# Patient Record
Sex: Male | Born: 1960 | Race: White | Hispanic: No | Marital: Single | Smoking: Former smoker
Health system: Southern US, Community
[De-identification: ages and names within clinical notes are randomized; demographics above are authoritative.]

## PROBLEM LIST (undated history)

## (undated) DIAGNOSIS — K219 Gastro-esophageal reflux disease without esophagitis: Secondary | ICD-10-CM

## (undated) DIAGNOSIS — Z87442 Personal history of urinary calculi: Secondary | ICD-10-CM

## (undated) DIAGNOSIS — T7840XA Allergy, unspecified, initial encounter: Secondary | ICD-10-CM

## (undated) DIAGNOSIS — G629 Polyneuropathy, unspecified: Secondary | ICD-10-CM

## (undated) DIAGNOSIS — M1990 Unspecified osteoarthritis, unspecified site: Secondary | ICD-10-CM

## (undated) DIAGNOSIS — I10 Essential (primary) hypertension: Secondary | ICD-10-CM

## (undated) DIAGNOSIS — I5043 Acute on chronic combined systolic (congestive) and diastolic (congestive) heart failure: Secondary | ICD-10-CM

## (undated) DIAGNOSIS — I83899 Varicose veins of unspecified lower extremities with other complications: Secondary | ICD-10-CM

## (undated) DIAGNOSIS — G709 Myoneural disorder, unspecified: Secondary | ICD-10-CM

## (undated) DIAGNOSIS — E119 Type 2 diabetes mellitus without complications: Secondary | ICD-10-CM

## (undated) DIAGNOSIS — N189 Chronic kidney disease, unspecified: Secondary | ICD-10-CM

## (undated) DIAGNOSIS — I272 Pulmonary hypertension, unspecified: Secondary | ICD-10-CM

## (undated) DIAGNOSIS — E118 Type 2 diabetes mellitus with unspecified complications: Secondary | ICD-10-CM

## (undated) DIAGNOSIS — S069X9A Unspecified intracranial injury with loss of consciousness of unspecified duration, initial encounter: Secondary | ICD-10-CM

## (undated) DIAGNOSIS — I639 Cerebral infarction, unspecified: Secondary | ICD-10-CM

## (undated) DIAGNOSIS — I8390 Asymptomatic varicose veins of unspecified lower extremity: Secondary | ICD-10-CM

## (undated) HISTORY — DX: Cerebral infarction, unspecified: I63.9

## (undated) HISTORY — DX: Myoneural disorder, unspecified: G70.9

## (undated) HISTORY — DX: Asymptomatic varicose veins of unspecified lower extremity: I83.90

## (undated) HISTORY — DX: Allergy, unspecified, initial encounter: T78.40XA

---

## 2000-08-17 ENCOUNTER — Ambulatory Visit (HOSPITAL_COMMUNITY): Admission: RE | Admit: 2000-08-17 | Discharge: 2000-08-17 | Payer: Self-pay | Admitting: Family Medicine

## 2000-08-17 ENCOUNTER — Encounter: Payer: Self-pay | Admitting: Family Medicine

## 2004-02-20 ENCOUNTER — Ambulatory Visit: Payer: Self-pay | Admitting: Family Medicine

## 2005-05-06 ENCOUNTER — Ambulatory Visit: Payer: Self-pay | Admitting: Family Medicine

## 2012-04-24 ENCOUNTER — Ambulatory Visit (HOSPITAL_COMMUNITY)
Admission: RE | Admit: 2012-04-24 | Discharge: 2012-04-24 | Disposition: A | Discharge status: Home / Self Care | Payer: No Typology Code available for payment source | Source: Ambulatory Visit | Attending: Family Medicine | Admitting: Family Medicine

## 2012-04-24 ENCOUNTER — Other Ambulatory Visit (HOSPITAL_COMMUNITY): Payer: Self-pay | Admitting: Family Medicine

## 2012-04-24 DIAGNOSIS — X58XXXA Exposure to other specified factors, initial encounter: Secondary | ICD-10-CM | POA: Insufficient documentation

## 2012-04-24 DIAGNOSIS — S79919A Unspecified injury of unspecified hip, initial encounter: Secondary | ICD-10-CM | POA: Insufficient documentation

## 2012-04-24 DIAGNOSIS — R229 Localized swelling, mass and lump, unspecified: Secondary | ICD-10-CM | POA: Insufficient documentation

## 2013-05-23 ENCOUNTER — Encounter (HOSPITAL_BASED_OUTPATIENT_CLINIC_OR_DEPARTMENT_OTHER): Payer: Self-pay | Attending: Internal Medicine

## 2013-08-08 ENCOUNTER — Emergency Department (HOSPITAL_COMMUNITY)
Admission: EM | Admit: 2013-08-08 | Discharge: 2013-08-08 | Disposition: A | Discharge status: Home / Self Care | Payer: Self-pay | Attending: Emergency Medicine | Admitting: Emergency Medicine

## 2013-08-08 ENCOUNTER — Encounter (HOSPITAL_COMMUNITY): Payer: Self-pay | Admitting: Emergency Medicine

## 2013-08-08 DIAGNOSIS — E119 Type 2 diabetes mellitus without complications: Secondary | ICD-10-CM | POA: Insufficient documentation

## 2013-08-08 DIAGNOSIS — Z794 Long term (current) use of insulin: Secondary | ICD-10-CM | POA: Insufficient documentation

## 2013-08-08 DIAGNOSIS — B029 Zoster without complications: Secondary | ICD-10-CM | POA: Insufficient documentation

## 2013-08-08 DIAGNOSIS — F17200 Nicotine dependence, unspecified, uncomplicated: Secondary | ICD-10-CM | POA: Insufficient documentation

## 2013-08-08 DIAGNOSIS — Z79899 Other long term (current) drug therapy: Secondary | ICD-10-CM | POA: Insufficient documentation

## 2013-08-08 HISTORY — DX: Type 2 diabetes mellitus without complications: E11.9

## 2013-08-08 MED ORDER — TETRACAINE HCL 0.5 % OP SOLN
1.0000 [drp] | Freq: Once | OPHTHALMIC | Status: AC
  Administered 2013-08-08: 1 [drp] via OPHTHALMIC
  Filled 2013-08-08: qty 2

## 2013-08-08 MED ORDER — ACYCLOVIR 400 MG PO TABS: 400.0000 mg | ORAL_TABLET | Freq: Every day | ORAL | Status: AC

## 2013-08-08 MED ORDER — FLUORESCEIN SODIUM 1 MG OP STRP
1.0000 | ORAL_STRIP | Freq: Once | OPHTHALMIC | Status: AC
  Administered 2013-08-08: 1 via OPHTHALMIC
  Filled 2013-08-08: qty 1

## 2013-08-08 NOTE — Discharge Instructions (Signed)
Shingles °Shingles (herpes zoster) is an infection that is caused by the same virus that causes chickenpox (varicella). The infection causes a painful skin rash and fluid-filled blisters, which eventually break open, crust over, and heal. It may occur in any area of the body, but it usually affects only one side of the body or face. The pain of shingles usually lasts about 1 month. However, some people with shingles may develop long-term (chronic) pain in the affected area of the body. °Shingles often occurs many years after the person had chickenpox. It is more common: °· In people older than 50 years. °· In people with weakened immune systems, such as those with HIV, AIDS, or cancer. °· In people taking medicines that weaken the immune system, such as transplant medicines. °· In people under great stress. °CAUSES  °Shingles is caused by the varicella zoster virus (VZV), which also causes chickenpox. After a person is infected with the virus, it can remain in the person's body for years in an inactive state (dormant). To cause shingles, the virus reactivates and breaks out as an infection in a nerve root. °The virus can be spread from person to person (contagious) through contact with open blisters of the shingles rash. It will only spread to people who have not had chickenpox. When these people are exposed to the virus, they may develop chickenpox. They will not develop shingles. Once the blisters scab over, the person is no longer contagious and cannot spread the virus to others. °SYMPTOMS  °Shingles shows up in stages. The initial symptoms may be pain, itching, and tingling in an area of the skin. This pain is usually described as burning, stabbing, or throbbing. In a few days or weeks, a painful red rash will appear in the area where the pain, itching, and tingling were felt. The rash is usually on one side of the body in a band or belt-like pattern. Then, the rash usually turns into fluid-filled blisters. They  will scab over and dry up in approximately 2-3 weeks. °Flu-like symptoms may also occur with the initial symptoms, the rash, or the blisters. These may include: °· Fever. °· Chills. °· Headache. °· Upset stomach. °DIAGNOSIS  °Your caregiver will perform a skin exam to diagnose shingles. Skin scrapings or fluid samples may also be taken from the blisters. This sample will be examined under a microscope or sent to a lab for further testing. °TREATMENT  °There is no specific cure for shingles. Your caregiver will likely prescribe medicines to help you manage the pain, recover faster, and avoid long-term problems. This may include antiviral drugs, anti-inflammatory drugs, and pain medicines. °HOME CARE INSTRUCTIONS  °· Take a cool bath or apply cool compresses to the area of the rash or blisters as directed. This may help with the pain and itching.   °· Only take over-the-counter or prescription medicines as directed by your caregiver.   °· Rest as directed by your caregiver. °· Keep your rash and blisters clean with mild soap and cool water or as directed by your caregiver.  °· Do not pick your blisters or scratch your rash. Apply an anti-itch cream or numbing creams to the affected area as directed by your caregiver. °· Keep your shingles rash covered with a loose bandage (dressing). °· Avoid skin contact with: °¨ Babies.   °¨ Pregnant women.   °¨ Children with eczema.   °¨ Elderly people with transplants.   °¨ People with chronic illnesses, such as leukemia or AIDS.   °· Wear loose-fitting clothing to help ease the   pain of material rubbing against the rash. °· Keep all follow-up appointments with your caregiver. If the area involved is on your face, you may receive a referral for follow-up to a specialist, such as an eye doctor (ophthalmologist) or an ear, nose, and throat (ENT) doctor. Keeping all follow-up appointments will help you avoid eye complications, chronic pain, or disability.   °SEEK IMMEDIATE MEDICAL  CARE IF:  °· You have facial pain, pain around the eye area, or loss of feeling on one side of your face. °· You have ear pain or ringing in your ear. °· You have loss of taste. °· Your pain is not relieved with prescribed medicines.   °· Your redness or swelling spreads.   °· You have more pain and swelling.  °· Your condition is worsening or has changed.   °· You have a fever or persistent symptoms for more than 2-3 days. °· You have a fever and your symptoms suddenly get worse. °MAKE SURE YOU: °· Understand these instructions. °· Will watch your condition. °· Will get help right away if you are not doing well or get worse. °Document Released: 01/10/2005 Document Revised: 10/05/2011 Document Reviewed: 08/25/2011 °[employer]® Patient Information ©2015 [employer]. This information is not intended to replace advice given to you by your health care provider. Make sure you discuss any questions you have with your health care provider. ° °

## 2013-08-08 NOTE — ED Provider Notes (Signed)
Medical screening examination/treatment/procedure(s) were performed by non-physician practitioner and as supervising physician I was immediately available for consultation/collaboration.   Leota Jacobsen, MD 08/08/13 [phone]

## 2013-08-08 NOTE — ED Notes (Signed)
Pt presents with a linear rash Frontal anterior left side of the head, reports intermittent burning sensation on the rash,onset 2 days ago, vital signs indicate low grade fever and elevated b/p of which pt denies hx of hypertension but endorses hx of diabetes.

## 2013-08-08 NOTE — ED Provider Notes (Signed)
CSN: [id number]     Arrival date & time 08/08/13  1535 History  This chart was scribed for Edgar Skeans, PA-C, non-physician practitioner working with Leota Jacobsen, MD by Vernell Barrier, ED scribe. This patient was seen in room WTR6/WTR6 and the patient's care was started at 4:36 PM.   Chief Complaint  Patient presents with  . Rash    Anterior frontal left side of the head   Patient is a 53 y.o. male presenting with rash. The history is provided by the patient. No language interpreter was used.  Rash Location:  Face and head/neck Head/neck rash location:  Head and L ear Facial rash location:  Forehead and L eye Quality: burning and redness   Quality: not itchy   Severity:  Mild Onset quality:  Gradual Duration:  2 days Timing:  Constant Progression:  Spreading Chronicity:  New Context: not plant contact   Relieved by:  Nothing Worsened by:  Nothing tried Ineffective treatments:  None tried Associated symptoms: no fever, no nausea and not vomiting    HPI Comments: Edgar Brewer is a 53 y.o. male w/ hx of DM2 presents to the Emergency Department complaining of a painful, non-itchy rash to the left forehead going up to the left parietal; onset 2 days ago. Intermittent shooting, burning, painful sensation associated. States it is also moving into the skin of his left eye primarily inner cornner and behind his left ear. Never had a rash present like this before. No rash at the tip of the nose. No known rash at the back of the head. Has done some work outside but denies any exposure to poison ivy or poison oak. Allergic to codeine. Does not regularly check blood sugar since at home machine broke; states it is usually in the mid 100s. Denies visual disturbance, fever, chills, nausea, or vomiting.  Also reports white blisters around the ankles due to diabetes. States the blisters have since burst. No pain.    PCP: Dr. Marlou Sa (Appt to see July 23)  Past Medical History  Diagnosis  Date  . Diabetes mellitus without complication    History reviewed. No pertinent past surgical history. History reviewed. No pertinent family history. History  Substance Use Topics  . Smoking status: Current Every Day Smoker -- 1.00 packs/day    Types: Cigarettes  . Smokeless tobacco: Never Used  . Alcohol Use: 0.6 oz/week    1 Glasses of wine per week    Review of Systems  Constitutional: Negative for fever and chills.  Eyes: Positive for pain. Negative for visual disturbance.  Gastrointestinal: Negative for nausea and vomiting.  Skin: Positive for rash.  All other systems reviewed and are negative.  Allergies  Codeine  Home Medications   Prior to Admission medications   Medication Sig Start Date End Date Taking? Authorizing Provider  aspirin 325 MG tablet Take 650 mg by mouth every 4 (four) hours as needed for mild pain.   Yes Historical Provider, MD  glipiZIDE (GLUCOTROL) 10 MG tablet Take 10 mg by mouth 2 (two) times daily before a meal.   Yes Historical Provider, MD  insulin detemir (LEVEMIR) 100 UNIT/ML injection Inject 30 Units into the skin at bedtime.   Yes Historical Provider, MD  Omega-3 Fatty Acids (FISH OIL PO) Take 1 capsule by mouth at bedtime.   Yes Historical Provider, MD  PRESCRIPTION MEDICATION Take 1 tablet by mouth daily. Dr gave patient samples of blood pressure/kidney medication - patient unsure of name   Yes Historical  Provider, MD  vitamin C (ASCORBIC ACID) 500 MG tablet Take 500 mg by mouth at bedtime.   Yes Historical Provider, MD  acyclovir (ZOVIRAX) 400 MG tablet Take 1 tablet (400 mg total) by mouth 5 (five) times daily. 08/08/13 08/18/13  Gilford Lardizabal A Forcucci, PA-C   Triage vitals: BP 177/74  Pulse 80  Temp(Src) 99.1 F (37.3 C) (Oral)  Resp 20  Ht 5\' 8"  (1.727 m)  Wt 205 lb (92.987 kg)  BMI 31.18 kg/m2  SpO2 97%  Physical Exam  Nursing note and vitals reviewed. Constitutional: He is oriented to person, place, and time. He appears  well-developed and well-nourished. No distress.  HENT:  Head: Normocephalic and atraumatic.  Left Ear: Tympanic membrane and external ear normal.  Eyes: Conjunctivae and EOM are normal. Pupils are equal, round, and reactive to light.  Left eye fluorescein dye examination with slit lamp reveals no increased uptake of fluorescein dye or any keratitic lesions at this time.  Neck: Neck supple. No tracheal deviation present.  Cardiovascular: Normal rate, regular rhythm and normal heart sounds.  Exam reveals no gallop and no friction rub.   No murmur heard. Pulmonary/Chest: Effort normal. No respiratory distress. He has no wheezes. He has no rales.  Musculoskeletal: Normal range of motion.  Neurological: He is alert and oriented to person, place, and time. He has normal strength. No cranial nerve deficit or sensory deficit.  Skin: Skin is warm and dry. Rash noted. No petechiae and no purpura noted. Rash is vesicular. Rash is not macular, not papular, not maculopapular, not nodular, not pustular and not urticarial. He is not diaphoretic. There is erythema. No pallor.     Vesicular rash with some excoriation on an erythematous base found over the left eye and into the posterior hairline.  There is no drainage, purpura, or hemorrhage at this time.    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  Ulcers are clean and dry with no surrounding erythema or warmth and do not appear to be infected at this time.    Psychiatric: He has a normal mood and affect. His behavior is normal.    ED Course  Procedures (including critical care time) DIAGNOSTIC STUDIES: Oxygen Saturation is 97% on room air, normal by my interpretation.    COORDINATION OF CARE: At 4:55 PM: Discussed treatment plan with patient which includes further evaluation using fluorescein stain. Suspected shingles; will treat with Acyclovir. Patient agrees.   WOODS LAMP: Performed by Edgar Skeans, PA-C @ 5:57 PM  Labs Review Labs Reviewed - No data to display  Imaging Review No results found.   EKG Interpretation None      MDM   Final diagnoses:  Herpes zoster  Patient is a 53 y.o. Male who presents to the ED with rash to the face.  Rash appears to be consistent with herpes zoster at this time.  There is negative hutchinsons sign and there is no keratitic lesion on fluorescein dye at this time.  Will prescribe acyclovir x 7 days.  Patient does have bilateral diabetic ulcers, but does not appear to have any cellulitis or infection at this time.  Patient was instructed to keep the wounds clean and dry.  Patient was told to return for eye pain, changes in his vision, fever, nausea, vomiting, or cellulitis signs.  He states understanding at this time.  Patient has an appointment with Dr. Marlou Sa on 08/15/13.   I personally performed the services described in this documentation,  which was scribed in my presence. The recorded information has been reviewed and is accurate.     Cherylann Parr, PA-C 08/08/13 1825

## 2014-05-04 ENCOUNTER — Emergency Department (HOSPITAL_COMMUNITY): Payer: Self-pay

## 2014-05-04 ENCOUNTER — Emergency Department (HOSPITAL_COMMUNITY)
Admission: EM | Admit: 2014-05-04 | Discharge: 2014-05-04 | Disposition: A | Discharge status: Home / Self Care | Payer: Self-pay | Attending: Emergency Medicine | Admitting: Emergency Medicine

## 2014-05-04 ENCOUNTER — Encounter (HOSPITAL_COMMUNITY): Payer: Self-pay | Admitting: Emergency Medicine

## 2014-05-04 DIAGNOSIS — R600 Localized edema: Secondary | ICD-10-CM | POA: Insufficient documentation

## 2014-05-04 DIAGNOSIS — E119 Type 2 diabetes mellitus without complications: Secondary | ICD-10-CM | POA: Insufficient documentation

## 2014-05-04 DIAGNOSIS — R609 Edema, unspecified: Secondary | ICD-10-CM

## 2014-05-04 DIAGNOSIS — Z23 Encounter for immunization: Secondary | ICD-10-CM | POA: Insufficient documentation

## 2014-05-04 DIAGNOSIS — Z79899 Other long term (current) drug therapy: Secondary | ICD-10-CM | POA: Insufficient documentation

## 2014-05-04 DIAGNOSIS — F17200 Nicotine dependence, unspecified, uncomplicated: Secondary | ICD-10-CM

## 2014-05-04 DIAGNOSIS — R0602 Shortness of breath: Secondary | ICD-10-CM | POA: Insufficient documentation

## 2014-05-04 DIAGNOSIS — Z720 Tobacco use: Secondary | ICD-10-CM | POA: Insufficient documentation

## 2014-05-04 DIAGNOSIS — Z791 Long term (current) use of non-steroidal anti-inflammatories (NSAID): Secondary | ICD-10-CM | POA: Insufficient documentation

## 2014-05-04 DIAGNOSIS — Z794 Long term (current) use of insulin: Secondary | ICD-10-CM | POA: Insufficient documentation

## 2014-05-04 LAB — COMPREHENSIVE METABOLIC PANEL
ALT: 41 U/L (ref 0–53)
ANION GAP: 6 (ref 5–15)
AST: 48 U/L — ABNORMAL HIGH (ref 0–37)
Albumin: 3 g/dL — ABNORMAL LOW (ref 3.5–5.2)
Alkaline Phosphatase: 197 U/L — ABNORMAL HIGH (ref 39–117)
BUN: 25 mg/dL — AB (ref 6–23)
CALCIUM: 8.6 mg/dL (ref 8.4–10.5)
CO2: 28 mmol/L (ref 19–32)
Chloride: 108 mmol/L (ref 96–112)
Creatinine, Ser: 1.44 mg/dL — ABNORMAL HIGH (ref 0.50–1.35)
GFR calc Af Amer: 62 mL/min — ABNORMAL LOW (ref >90)
GFR calc non Af Amer: 54 mL/min — ABNORMAL LOW (ref >90)
Glucose, Bld: 167 mg/dL — ABNORMAL HIGH (ref 70–99)
Potassium: 4.7 mmol/L (ref 3.5–5.1)
Sodium: 142 mmol/L (ref 135–145)
Total Bilirubin: 0.4 mg/dL (ref 0.3–1.2)
Total Protein: 5.9 g/dL — ABNORMAL LOW (ref 6.0–8.3)

## 2014-05-04 LAB — CBC WITH DIFFERENTIAL/PLATELET
Basophils Absolute: 0 10*3/uL (ref 0.0–0.1)
Basophils Relative: 0 % (ref 0–1)
Eosinophils Absolute: 0.2 10*3/uL (ref 0.0–0.7)
Eosinophils Relative: 3 % (ref 0–5)
HCT: 37.5 % — ABNORMAL LOW (ref 39.0–52.0)
HEMOGLOBIN: 12.3 g/dL — AB (ref 13.0–17.0)
LYMPHS ABS: 1 10*3/uL (ref 0.7–4.0)
LYMPHS PCT: 11 % — AB (ref 12–46)
MCH: 29.3 pg (ref 26.0–34.0)
MCHC: 32.8 g/dL (ref 30.0–36.0)
MCV: 89.3 fL (ref 78.0–100.0)
Monocytes Absolute: 0.5 10*3/uL (ref 0.1–1.0)
Monocytes Relative: 6 % (ref 3–12)
NEUTROS PCT: 80 % — AB (ref 43–77)
Neutro Abs: 7.1 10*3/uL (ref 1.7–7.7)
Platelets: 216 10*3/uL (ref 150–400)
RBC: 4.2 MIL/uL — AB (ref 4.22–5.81)
RDW: 14 % (ref 11.5–15.5)
WBC: 8.9 10*3/uL (ref 4.0–10.5)

## 2014-05-04 LAB — LIPASE, BLOOD: Lipase: 31 U/L (ref 11–59)

## 2014-05-04 LAB — BRAIN NATRIURETIC PEPTIDE: B Natriuretic Peptide: 299.2 pg/mL — ABNORMAL HIGH (ref 0.0–100.0)

## 2014-05-04 LAB — CBG MONITORING, ED: Glucose-Capillary: 162 mg/dL — ABNORMAL HIGH (ref 70–99)

## 2014-05-04 LAB — TROPONIN I: Troponin I: <0.03 ng/mL (ref <0.031)

## 2014-05-04 MED ORDER — SODIUM CHLORIDE 0.9 % IV SOLN
INTRAVENOUS | Status: DC
  Administered 2014-05-04: 17:00:00 via INTRAVENOUS

## 2014-05-04 MED ORDER — TETANUS-DIPHTH-ACELL PERTUSSIS 5-2.5-18.5 LF-MCG/0.5 IM SUSP
0.5000 mL | Freq: Once | INTRAMUSCULAR | Status: AC
  Administered 2014-05-04: 0.5 mL via INTRAMUSCULAR
  Filled 2014-05-04: qty 0.5

## 2014-05-04 MED ORDER — FUROSEMIDE 10 MG/ML IJ SOLN
40.0000 mg | Freq: Once | INTRAMUSCULAR | Status: AC
  Administered 2014-05-04: 40 mg via INTRAVENOUS
  Filled 2014-05-04: qty 4

## 2014-05-04 NOTE — ED Provider Notes (Signed)
CSN: [id number]     Arrival date & time 05/04/14  1520 History   First MD Initiated Contact with Patient 05/04/14 1606     Chief Complaint  Patient presents with  . Shortness of Breath  . Leg Swelling  . Abdominal Pain     (Consider location/radiation/quality/duration/timing/severity/associated sxs/prior Treatment) HPI   Bardia Sarantos is a 54 y.o. male complaining of shortness of breath and dry cough associated with increasing abdominal girth, and non-improving bilateral lower extremity peripheral edema worse on the right than the left. Patient recently saw his primary care physician Dr. Marlou Sa who started him on spironolactone approximately 2 weeks ago with no relief. Patient smokes cigarettes daily, has no formal diagnosis of COPD.  Past Medical History  Diagnosis Date  . Diabetes mellitus without complication    History reviewed. No pertinent past surgical history. No family history on file. History  Substance Use Topics  . Smoking status: Current Every Day Smoker -- 1.00 packs/day    Types: Cigarettes  . Smokeless tobacco: Never Used  . Alcohol Use: 0.6 oz/week    1 Glasses of wine per week    Review of Systems  10 systems reviewed and found to be negative, except as noted in the HPI.   Allergies  Codeine  Home Medications   Prior to Admission medications   Medication Sig Start Date End Date Taking? Authorizing Provider  cephALEXin (KEFLEX) 500 MG capsule Take 500 mg by mouth every 12 (twelve) hours. For 10 Days.   Yes Historical Provider, MD  gabapentin (NEURONTIN) 300 MG capsule Take 300 mg by mouth 3 (three) times daily.   Yes Historical Provider, MD  glipiZIDE (GLUCOTROL) 10 MG tablet Take 10 mg by mouth 2 (two) times daily before a meal.   Yes Historical Provider, MD  ibuprofen (ADVIL,MOTRIN) 200 MG tablet Take 400 mg by mouth every 6 (six) hours as needed for moderate pain (pain).   Yes Historical Provider, MD  insulin detemir (LEVEMIR) 100 UNIT/ML injection  Inject 30 Units into the skin at bedtime.   Yes Historical Provider, MD  losartan (COZAAR) 50 MG tablet Take 50 mg by mouth daily.   Yes Historical Provider, MD  naproxen (NAPROSYN) 500 MG tablet Take 500 mg by mouth 2 (two) times daily with a meal.   Yes Historical Provider, MD  PENTOXIFYLLINE ER PO Take 400 mg by mouth 2 (two) times daily.   Yes Historical Provider, MD  spironolactone (ALDACTONE) 25 MG tablet Take 25 mg by mouth daily.   Yes Historical Provider, MD  vitamin C (ASCORBIC ACID) 500 MG tablet Take 500 mg by mouth at bedtime.   Yes Historical Provider, MD   BP 162/88 mmHg  Pulse 84  Temp(Src) 97.9 F (36.6 C) (Oral)  Resp 20  SpO2 99% Physical Exam  Constitutional: He is oriented to person, place, and time. He appears well-developed and well-nourished. No distress.  HENT:  Head: Normocephalic.  Eyes: Conjunctivae and EOM are normal. Pupils are equal, round, and reactive to light.  Neck: Normal range of motion. JVD present.  Cardiovascular: Normal rate and regular rhythm.   Pulmonary/Chest: Effort normal and breath sounds normal. No stridor. No respiratory distress. He has no wheezes. He has no rales. He exhibits no tenderness.  Decreased air movement at the bases with no crackles.  Abdominal: Soft.  Musculoskeletal: Normal range of motion. He exhibits edema and tenderness.  Bilateral lower extremity pitting edema, hyperpigmentation consistent with venous stasis, trace warmth, mild oozing. Distal pulses are  palpable. Neurovascularly intact.  Neurological: He is alert and oriented to person, place, and time.  Psychiatric: He has a normal mood and affect.  Nursing note and vitals reviewed.   ED Course  Procedures (including critical care time) Labs Review Labs Reviewed  CBC WITH DIFFERENTIAL/PLATELET - Abnormal; Notable for the following:    RBC 4.20 (*)    Hemoglobin 12.3 (*)    HCT 37.5 (*)    Neutrophils Relative % 80 (*)    Lymphocytes Relative 11 (*)    All  other components within normal limits  COMPREHENSIVE METABOLIC PANEL - Abnormal; Notable for the following:    Glucose, Bld 167 (*)    BUN 25 (*)    Creatinine, Ser 1.44 (*)    Total Protein 5.9 (*)    Albumin 3.0 (*)    AST 48 (*)    Alkaline Phosphatase 197 (*)    GFR calc non Af Amer 54 (*)    GFR calc Af Amer 62 (*)    All other components within normal limits  BRAIN NATRIURETIC PEPTIDE - Abnormal; Notable for the following:    B Natriuretic Peptide 299.2 (*)    All other components within normal limits  CBG MONITORING, ED - Abnormal; Notable for the following:    Glucose-Capillary 162 (*)    All other components within normal limits  LIPASE, BLOOD  TROPONIN I  I-STAT TROPOININ, ED    Imaging Review Dg Chest 2 View  05/04/2014   CLINICAL DATA:  Right ankle swelling, right lower leg swelling, shortness of Breath  EXAM: CHEST  2 VIEW  COMPARISON:  None.  FINDINGS: Cardiomediastinal silhouette is unremarkable. No acute infiltrate or pleural effusion. Mild degenerative changes thoracic spine. Mild interstitial prominence bilaterally without convincing pulmonary edema.  IMPRESSION: No infiltrate or pleural effusion. Mild interstitial prominence bilaterally without convincing pulmonary edema.   Electronically Signed   By: Lahoma Crocker M.D.   On: 05/04/2014 17:13     EKG Interpretation   Date/Time:  Sunday May 04 2014 16:26:34 EDT Ventricular Rate:  92 PR Interval:  174 QRS Duration: 93 QT Interval:  395 QTC Calculation: 489 R Axis:   74 Text Interpretation:  Sinus rhythm RSR' in V1 or V2, right VCD or RVH  Borderline T abnormalities, lateral leads Borderline prolonged QT interval  no STEMI. Confirmed by Johnney Killian, MD, Jeannie Done [phone]) on 05/04/2014 5:04:29 PM      MDM   Final diagnoses:  SOB (shortness of breath)  Tobacco use disorder  Peripheral edema    Filed Vitals:   05/04/14 1600 05/04/14 1630 05/04/14 1700 05/04/14 1903  BP: 177/89 172/85 167/76 162/88  Pulse: 93  91 91 84  Temp:      TempSrc:      Resp:  21  20  SpO2: 98% 96% 94% 99%    Medications  0.9 %  sodium chloride infusion ( Intravenous New Bag/Given 05/04/14 1723)  furosemide (LASIX) injection 40 mg (40 mg Intravenous Given 05/04/14 1726)  Tdap (BOOSTRIX) injection 0.5 mL (0.5 mLs Intramuscular Given 05/04/14 1846)    Meba Pezzi is a pleasant 54 y.o. male presenting with shortness of breath. No wheezing. Patient does appear to be slightly fluid overloaded with peripheral edema which is not increasing significantly. Lung sounds are clear. EKG is nonischemic, troponin is negative, chest x-rays without significant allergy. Pro BNP is slightly elevated at 299. Case discussed with cardiologist Dr. Wynonia Lawman: I'm trying to expedite outpatient care for him. He has recommended that  I message [facility] to expedite this person's appointment.  Evaluation does not show pathology that would require ongoing emergent intervention or inpatient treatment. Pt is hemodynamically stable and mentating appropriately. Discussed findings and plan with patient/guardian, who agrees with care plan. All questions answered. Return precautions discussed and outpatient follow up given.     Monico Blitz, PA-C 05/04/14 1935  Charlesetta Shanks, MD 05/04/14 [phone]

## 2014-05-04 NOTE — ED Notes (Addendum)
Pt c/o abd pain, trouble breathing, rigth ankle swelling and left lower leg swelling. Pt was given pill to help remove excess water "in my lungs". Marland Kitchen

## 2014-05-04 NOTE — Discharge Instructions (Signed)
It is very important that you make an appointment with the cardiologist as soon as possible to evaluate your heart.  Do not hesitate to return to the emergency room for any new, worsening or concerning symptoms.  Try to quit smoking, reduce salt.

## 2014-05-04 NOTE — ED Notes (Signed)
Ambulated in the hallway on room air. Beginning sats-97%. After walking entire block of the ED sats-96%

## 2014-05-04 NOTE — ED Notes (Signed)
Awake. Verbally responsive. A/O x4. Resp even and unlabored. No audible adventitious breath sounds noted. ABC's intact.  

## 2014-05-04 NOTE — ED Notes (Signed)
Vascular tech in room with the patient.

## 2014-05-04 NOTE — Progress Notes (Signed)
Bilateral lower extremity venous duplex completed:  No evidence of DVT, superficial thrombosis, or Baker's cyst.   

## 2014-06-06 NOTE — Progress Notes (Signed)
Patient ID: Edgar Brewer, male   DOB: 1960-08-29, 54 y.o.   MRN: [id number]     Cardiology Office Note   Date:  06/06/2014   ID:  Edgar Brewer, DOB May 28, 1960, MRN [id number]  PCP:  No primary care provider on file.  Cardiologist:   Jenkins Rouge, MD   No chief complaint on file.     History of Present Illness: Edgar Brewer is a 54 y.o. male who presents for evaluation of edema, HTN and dyspnea Seen in ER 4/10.  BNP was 299.  CXR clear He is an everyday smoker   No chest pain Been on Aldactone per primary Dr Marlou Sa.  Appears to also have chronic venous insufficiency  Received one iv lasix dose and was d/c   He is diabetic But not on ACE/ARB   05/04/14  [place] bilateral LE no DVT   CXR reviewed mild interstitial prominence no edema or cancer   Smoking a 1/2 ppd.  Since d/c less dyspnea. On lasix with aldactone now for LE edema.  No chest pain but exertional dyspnea, smoker and evidence of vascular Disease with left carotid bruit  DM doesn't check BS often  Has had LE cellulitis in passed and was on keflex a month or so ago    Past Medical History  Diagnosis Date  . Diabetes mellitus without complication     No past surgical history on file.   Current Outpatient Prescriptions  Medication Sig Dispense Refill  . cephALEXin (KEFLEX) 500 MG capsule Take 500 mg by mouth every 12 (twelve) hours. For 10 Days.    . gabapentin (NEURONTIN) 300 MG capsule Take 300 mg by mouth 3 (three) times daily.    Marland Kitchen glipiZIDE (GLUCOTROL) 10 MG tablet Take 10 mg by mouth 2 (two) times daily before a meal.    . ibuprofen (ADVIL,MOTRIN) 200 MG tablet Take 400 mg by mouth every 6 (six) hours as needed for moderate pain (pain).    . insulin detemir (LEVEMIR) 100 UNIT/ML injection Inject 30 Units into the skin at bedtime.    Marland Kitchen losartan (COZAAR) 50 MG tablet Take 50 mg by mouth daily.    . naproxen (NAPROSYN) 500 MG tablet Take 500 mg by mouth 2 (two) times daily with a meal.    . PENTOXIFYLLINE ER PO Take 400 mg  by mouth 2 (two) times daily.    Marland Kitchen spironolactone (ALDACTONE) 25 MG tablet Take 25 mg by mouth daily.    . vitamin C (ASCORBIC ACID) 500 MG tablet Take 500 mg by mouth at bedtime.     No current facility-administered medications for this visit.    Allergies:   Codeine    Social History:  The patient  reports that he has been smoking Cigarettes.  He has been smoking about 1.00 pack per day. He has never used smokeless tobacco. He reports that he drinks about 0.6 oz of alcohol per week. He reports that he does not use illicit drugs.  From [place] has lived in [place], [place] and [place].  Works part time [employer] 1/ppd now   Family History:  The patient's father died cancer 2 years ag DM on mothers side    ROS:  Please see the history of present illness.   Otherwise, review of systems are positive for none.   All other systems are reviewed and negative.    PHYSICAL EXAM: VS:  There were no vitals taken for this visit. , BMI There is no weight on file to calculate  BMI. Affect appropriate Chronically ill [place] HEENT: normal Neck supple with no adenopathy JVP normal left  bruits no thyromegaly Lungs interstitial fibrosis bases  no wheezing and good diaphragmatic motion Heart:  S1/S2 no murmur, no rub, gallop or click PMI normal Abdomen: benighn, BS positve, no tenderness, no AAA no bruit.  No HSM or HJR Distal pulses intact with no bruits Plus 2-3 brawny chronic LE edema with stasis  Neuro non-focal Skin warm and dry No muscular weakness    EKG:  05/05/14  SR rate 92 normal borderline voltage for LVH    Recent Labs: 05/04/2014: ALT 41; B Natriuretic Peptide 299.2*; BUN 25*; Creatinine 1.44*; Hemoglobin 12.3*; Platelets 216; Potassium 4.7; Sodium 142    Lipid Panel No results found for: CHOL, TRIG, HDL, CHOLHDL, VLDL, LDLCALC, LDLDIRECT    Wt Readings from Last 3 Encounters:  08/08/13 92.987 kg (205 lb)      Other studies Reviewed: Additional studies/ records that were reviewed  today include:  Epic notes and ER records Records from primary Dr Marlou Sa.    ASSESSMENT AND PLAN:  1.  CHF:  Etiology not clear Echo to assess diastolic/systolic  Continue lasix/aldactone  BMET BNP in 2 weeks 2. Edema:  Chronic venous insuf at risk for skin breakdown.  Continue both diurectis.  Consider referral to wound center  [place] no DVT 3. Dyspnea:  COPD discussed smoking cessation no initiative to quit.  Concern for development of ILD.  BNP not that elevated and no frank edema Echo for RV/LV function exercise myovue to r/o CAD given bruit, DM, smoking  4. Left carotid bruit:  F/u duplex ASA 5.    Current medicines are reviewed at length with the patient today.  The patient does not have concerns regarding medicines.  The following changes have been made:  no change  Labs/ tests ordered today include:  Carotid Dupex, Exercise Myovue , Echo BMET   No orders of the defined types were placed in this encounter.     Disposition:   FU with after tests      Signed, Jenkins Rouge, MD  06/06/2014 4:36 PM    [facility] [address], [place], [place]  [address] Phone: [phone]; Fax: [phone]

## 2014-06-09 ENCOUNTER — Encounter: Payer: Self-pay | Admitting: Cardiovascular Disease

## 2014-06-09 ENCOUNTER — Ambulatory Visit (INDEPENDENT_AMBULATORY_CARE_PROVIDER_SITE_OTHER): Payer: No Typology Code available for payment source | Admitting: Cardiovascular Disease

## 2014-06-09 VITALS — BP 158/80 | HR 82 | Ht 68.0 in | Wt 206.0 lb

## 2014-06-09 DIAGNOSIS — R0789 Other chest pain: Secondary | ICD-10-CM

## 2014-06-09 DIAGNOSIS — R0600 Dyspnea, unspecified: Secondary | ICD-10-CM

## 2014-06-09 DIAGNOSIS — Z79899 Other long term (current) drug therapy: Secondary | ICD-10-CM

## 2014-06-09 DIAGNOSIS — R0989 Other specified symptoms and signs involving the circulatory and respiratory systems: Secondary | ICD-10-CM

## 2014-06-09 NOTE — Patient Instructions (Signed)
Medication Instructions:  NO CHANGES   Labwork: BMET  BNP  IN  2 WEEKS  Testing/Procedures: Your physician has requested that you have an echocardiogram. Echocardiography is a painless test that uses sound waves to create images of your heart. It provides your doctor with information about the size and shape of your heart and how well your heart's chambers and valves are working. This procedure takes approximately one hour. There are no restrictions for this procedure.    Your physician has requested that you have en exercise stress myoview. For further information please visit [email or web address]. Please follow instruction sheet, as given.  Your physician has requested that you have a carotid duplex. This test is an ultrasound of the carotid arteries in your neck. It looks at blood flow through these arteries that supply the brain with blood. Allow one hour for this exam. There are no restrictions or special instructions.   Follow-Up:  Your physician recommends that you schedule a follow-up appointment in: AFTER TESTS  ARE  DONE   Any Other Special Instructions Will Be Listed Below (If Applicable).

## 2014-06-17 ENCOUNTER — Ambulatory Visit (HOSPITAL_COMMUNITY): Payer: No Typology Code available for payment source

## 2014-06-17 ENCOUNTER — Other Ambulatory Visit: Payer: Self-pay

## 2014-06-17 ENCOUNTER — Ambulatory Visit (HOSPITAL_BASED_OUTPATIENT_CLINIC_OR_DEPARTMENT_OTHER): Payer: No Typology Code available for payment source

## 2014-06-17 ENCOUNTER — Ambulatory Visit (HOSPITAL_COMMUNITY): Payer: Self-pay | Attending: Cardiovascular Disease

## 2014-06-17 DIAGNOSIS — I313 Pericardial effusion (noninflammatory): Secondary | ICD-10-CM | POA: Insufficient documentation

## 2014-06-17 DIAGNOSIS — R0789 Other chest pain: Secondary | ICD-10-CM

## 2014-06-17 DIAGNOSIS — R0989 Other specified symptoms and signs involving the circulatory and respiratory systems: Secondary | ICD-10-CM

## 2014-06-17 DIAGNOSIS — I340 Nonrheumatic mitral (valve) insufficiency: Secondary | ICD-10-CM | POA: Insufficient documentation

## 2014-06-17 DIAGNOSIS — I6523 Occlusion and stenosis of bilateral carotid arteries: Secondary | ICD-10-CM | POA: Insufficient documentation

## 2014-06-17 DIAGNOSIS — R0600 Dyspnea, unspecified: Secondary | ICD-10-CM

## 2014-06-18 ENCOUNTER — Other Ambulatory Visit (HOSPITAL_COMMUNITY): Payer: No Typology Code available for payment source

## 2014-06-19 ENCOUNTER — Telehealth (HOSPITAL_COMMUNITY): Payer: Self-pay

## 2014-06-19 NOTE — Telephone Encounter (Signed)
Patient given detailed instructions per Myocardial Perfusion Study Information Sheet for test on 06-24-2014 at 11:45am. Patient verbalized understanding. Edgar Brewer, Airika Alkhatib A

## 2014-06-20 ENCOUNTER — Other Ambulatory Visit: Payer: No Typology Code available for payment source

## 2014-06-20 ENCOUNTER — Encounter (HOSPITAL_COMMUNITY): Payer: No Typology Code available for payment source

## 2014-06-24 ENCOUNTER — Other Ambulatory Visit (INDEPENDENT_AMBULATORY_CARE_PROVIDER_SITE_OTHER): Payer: No Typology Code available for payment source | Admitting: *Deleted

## 2014-06-24 ENCOUNTER — Ambulatory Visit (HOSPITAL_COMMUNITY): Payer: Self-pay | Attending: Cardiovascular Disease

## 2014-06-24 DIAGNOSIS — Z79899 Other long term (current) drug therapy: Secondary | ICD-10-CM

## 2014-06-24 DIAGNOSIS — R0789 Other chest pain: Secondary | ICD-10-CM

## 2014-06-24 DIAGNOSIS — R0600 Dyspnea, unspecified: Secondary | ICD-10-CM

## 2014-06-24 DIAGNOSIS — R079 Chest pain, unspecified: Secondary | ICD-10-CM | POA: Insufficient documentation

## 2014-06-24 DIAGNOSIS — R0609 Other forms of dyspnea: Secondary | ICD-10-CM | POA: Insufficient documentation

## 2014-06-24 LAB — BASIC METABOLIC PANEL
BUN: 29 mg/dL — ABNORMAL HIGH (ref 6–23)
CHLORIDE: 108 meq/L (ref 96–112)
CO2: 28 mEq/L (ref 19–32)
Calcium: 8.6 mg/dL (ref 8.4–10.5)
Creatinine, Ser: 1.48 mg/dL (ref 0.40–1.50)
GFR: 52.43 mL/min — ABNORMAL LOW (ref >60.00)
Glucose, Bld: 156 mg/dL — ABNORMAL HIGH (ref 70–99)
POTASSIUM: 3.8 meq/L (ref 3.5–5.1)
Sodium: 140 mEq/L (ref 135–145)

## 2014-06-24 LAB — MYOCARDIAL PERFUSION IMAGING
CHL CUP NUCLEAR SRS: 0
LHR: 0.32
LV sys vol: 98 mL
LVDIAVOL: 169 mL
NUC STRESS TID: 1.04
Nuc Stress EF: 42 %
Peak HR: 115 {beats}/min
Rest HR: 85 {beats}/min
SDS: 0
SSS: 0

## 2014-06-24 LAB — BRAIN NATRIURETIC PEPTIDE: PRO B NATRI PEPTIDE: 283 pg/mL — AB (ref 0.0–100.0)

## 2014-06-24 MED ORDER — TECHNETIUM TC 99M SESTAMIBI GENERIC - CARDIOLITE
33.0000 | Freq: Once | INTRAVENOUS | Status: AC | PRN
  Administered 2014-06-24: 33 via INTRAVENOUS

## 2014-06-24 MED ORDER — REGADENOSON 0.4 MG/5ML IV SOLN
0.4000 mg | Freq: Once | INTRAVENOUS | Status: AC
  Administered 2014-06-24: 0.4 mg via INTRAVENOUS

## 2014-06-24 MED ORDER — TECHNETIUM TC 99M SESTAMIBI GENERIC - CARDIOLITE
11.0000 | Freq: Once | INTRAVENOUS | Status: AC | PRN
  Administered 2014-06-24: 11 via INTRAVENOUS

## 2014-06-24 NOTE — Addendum Note (Signed)
Addended by: Eulis Foster on: 06/24/2014 12:20 PM   Modules accepted: Orders

## 2014-06-25 ENCOUNTER — Other Ambulatory Visit: Payer: Self-pay | Admitting: *Deleted

## 2014-06-25 MED ORDER — LOSARTAN POTASSIUM 100 MG PO TABS: 100.0000 mg | ORAL_TABLET | Freq: Every day | ORAL | Status: DC

## 2014-08-23 NOTE — Progress Notes (Signed)
Patient ID: Tige Costilow, male   DOB: 1960/05/14, 54 y.o.   MRN: [id number]     Cardiology Office Note   Date:  08/25/2014   ID:  Edgar Brewer, DOB [birth year], MRN [id number]  PCP:  [name], MD  Cardiologist:   Jenkins Rouge, MD   No chief complaint on file.     History of Present Illness: Edgar Brewer is a 54 y.o. male first seen 05/2014  for evaluation of edema, HTN and dyspnea Seen in ER 4/10.  BNP was 299.  CXR clear He is an everyday smoker   No chest pain Been on Aldactone per primary Dr Marlou Sa.  Appears to also have chronic venous insufficiency  Received one iv lasix dose and was d/c   He is diabetic But not on ACE/ARB   05/04/14  [place] bilateral LE no DVT   CXR reviewed mild interstitial prominence no edema or cancer   Smoking a 1/2 ppd.   F/U Echo EF 45-50% reviewed 05/2014 Study Conclusions  - Left ventricle: The cavity size was mildly dilated. Wall thickness was increased in a pattern of mild LVH. Systolic function was mildly reduced. The estimated ejection fraction was in the range of 45% to 50%. Diffuse hypokinesis. Doppler parameters are consistent with elevated ventricular end-diastolic filling pressure. - Mitral valve: There was mild regurgitation. - Left atrium: The atrium was mildly dilated. - Atrial septum: No defect or patent foramen ovale was identified. - Pulmonary arteries: PA peak pressure: 52 mm Hg (S). - Pericardium, extracardiac: Small to moderate circumferential pericardial effusion no tamponade.  05/2014:  Carotid plaque no stenosis   Myovue 05/2014:  No ischemia or infarct but EF 42%    Edema better on diuretic.  His biggest issues are COPD and ongoing smoking and chronic venous insufficiency.  Legs have had poor healing, edema and stasis for  Over 5 years  .    Past Medical History  Diagnosis Date  . Diabetes mellitus without complication     No past surgical history on file.   Current Outpatient Prescriptions  Medication Sig  Dispense Refill  . furosemide (LASIX) 20 MG tablet Take 20 mg by mouth daily.    Marland Kitchen gabapentin (NEURONTIN) 300 MG capsule Take 300 mg by mouth 3 (three) times daily.    Marland Kitchen glipiZIDE (GLUCOTROL) 10 MG tablet Take 10 mg by mouth 2 (two) times daily before a meal.    . ibuprofen (ADVIL,MOTRIN) 200 MG tablet Take 400 mg by mouth every 6 (six) hours as needed for moderate pain (pain).    . insulin detemir (LEVEMIR) 100 UNIT/ML injection Inject 30 Units into the skin at bedtime.    Marland Kitchen losartan (COZAAR) 100 MG tablet Take 1 tablet (100 mg total) by mouth daily. 30 tablet 11  . naproxen (NAPROSYN) 500 MG tablet Take 500 mg by mouth 2 (two) times daily with a meal.    . PENTOXIFYLLINE ER PO Take 400 mg by mouth 2 (two) times daily.    . vitamin C (ASCORBIC ACID) 500 MG tablet Take 500 mg by mouth at bedtime.     No current facility-administered medications for this visit.    Allergies:   Codeine    Social History:  The patient  reports that he has been smoking Cigarettes.  He has been smoking about 1.00 pack per day. He has never used smokeless tobacco. He reports that he drinks about 0.6 oz of alcohol per week. He reports that he does not use illicit drugs.  From [place] has lived in [place], [place] and [place].  Works part time [employer] 1/ppd now   Family History:  The patient's father died cancer 2 years ag DM on mothers side    ROS:  Please see the history of present illness.   Otherwise, review of systems are positive for none.   All other systems are reviewed and negative.    PHYSICAL EXAM: VS:  BP 134/68 mmHg  Pulse 68  Ht 5\' 8"  (1.727 m)  Wt 89.268 kg (196 lb 12.8 oz)  BMI 29.93 kg/m2 , BMI Body mass index is 29.93 kg/(m^2). Affect appropriate Chronically ill [place] HEENT: normal Neck supple with no adenopathy JVP normal left  bruits no thyromegaly Lungs interstitial fibrosis bases  no wheezing and good diaphragmatic motion Heart:  S1/S2 no murmur, no rub, gallop or click PMI normal Abdomen:  benighn, BS positve, no tenderness, no AAA no bruit.  No HSM or HJR Distal pulses intact with no bruits Plus 2-3 brawny chronic LE edema with stasis  Neuro non-focal Skin warm and dry No muscular weakness    EKG:  05/05/14  SR rate 92 normal borderline voltage for LVH    Recent Labs: 05/04/2014: ALT 41; B Natriuretic Peptide 299.2*; Hemoglobin 12.3*; Platelets 216 06/24/2014: BUN 29*; Creatinine, Ser 1.48; Potassium 3.8; Pro B Natriuretic peptide (BNP) 283.0*; Sodium 140    Lipid Panel No results found for: CHOL, TRIG, HDL, CHOLHDL, VLDL, LDLCALC, LDLDIRECT    Wt Readings from Last 3 Encounters:  08/25/14 89.268 kg (196 lb 12.8 oz)  06/24/14 90.719 kg (200 lb)  06/09/14 93.441 kg (206 lb)      Other studies Reviewed: Additional studies/ records that were reviewed today include:  Epic notes and ER records Records from primary Dr Marlou Sa.    ASSESSMENT AND PLAN:  1.  CHF:  Mild improved with diuretic and ARB  Symptoms more related to smoking/COPD and edema from venous disease not heart 2. Edema:  Refer to Dr Debara Pickett.  Continue diuretic  Encouraged him to get support hose  Script for knee high 30-40 mmHg stockings given  f/u Dr Marlou Sa  3. Dyspnea:  COPD discussed smoking cessation no initiative to quit.  Concern for development of ILD.  BNP not that elevated and no frank edema  4. Left carotid bruit:  Plaque no stenosis f/u duplex 2 years ASA  , statin   Current medicines are reviewed at length with the patient today.  The patient does not have concerns regarding medicines.  The following changes have been made:  no change  Labs/ tests ordered today include:    No orders of the defined types were placed in this encounter.     Disposition:  F/u with me in a year with echo  F/u Dr Debara Pickett for venous disease     Signed, Jenkins Rouge, MD  08/25/2014 2:08 PM    [facility] [place], [place], [place]  [address] Phone: [phone]; Fax: [phone]

## 2014-08-25 ENCOUNTER — Encounter: Payer: Self-pay | Admitting: Cardiovascular Disease

## 2014-08-25 ENCOUNTER — Ambulatory Visit (INDEPENDENT_AMBULATORY_CARE_PROVIDER_SITE_OTHER): Payer: No Typology Code available for payment source | Admitting: Cardiovascular Disease

## 2014-08-25 VITALS — BP 134/68 | HR 68 | Ht 68.0 in | Wt 196.8 lb

## 2014-08-25 DIAGNOSIS — R0600 Dyspnea, unspecified: Secondary | ICD-10-CM

## 2014-08-25 NOTE — Patient Instructions (Signed)
Medication Instructions:  NO CHANGES  Labwork: NONE  Testing/Procedures: Your physician has requested that you have an echocardiogram. Echocardiography is a painless test that uses sound waves to create images of your heart. It provides your doctor with information about the size and shape of your heart and how well your heart's chambers and valves are working. This procedure takes approximately one hour. There are no restrictions for this procedure.  IN   A YEAR  Follow-Up: Your physician wants you to follow-up in: [place] will receive a reminder letter in the mail two months in advance. If you don't receive a letter, please call our office to schedule the follow-up appointment.   ALSO  NEEDS TO  SEE DR  HILTY  FOR   VENOUS  INSUFFIENCY Any Other Special Instructions Will Be Listed Below (If Applicable).

## 2014-10-21 ENCOUNTER — Ambulatory Visit (INDEPENDENT_AMBULATORY_CARE_PROVIDER_SITE_OTHER): Payer: No Typology Code available for payment source | Admitting: Internal Medicine

## 2014-10-21 VITALS — BP 140/76 | HR 73 | Ht 68.0 in | Wt 207.8 lb

## 2014-10-21 DIAGNOSIS — R0609 Other forms of dyspnea: Secondary | ICD-10-CM

## 2014-10-21 DIAGNOSIS — M7989 Other specified soft tissue disorders: Secondary | ICD-10-CM

## 2014-10-21 DIAGNOSIS — M79606 Pain in leg, unspecified: Secondary | ICD-10-CM

## 2014-10-21 DIAGNOSIS — R600 Localized edema: Secondary | ICD-10-CM

## 2014-10-21 DIAGNOSIS — I872 Venous insufficiency (chronic) (peripheral): Secondary | ICD-10-CM

## 2014-10-21 NOTE — Patient Instructions (Signed)
Dr. Debara Pickett has ordered a lower extremity venous doppler to assess for venous reflux or insufficiency. This is done at [facility] on [facility]  Dr. Debara Pickett recommends that you obtain compression stockings and wear those daily.  - knee high  - both legs - 20-50mmHg  We will call you with the test results  You can follow up with Dr. Debara Pickett as needed.

## 2014-10-22 ENCOUNTER — Encounter: Payer: Self-pay | Admitting: Internal Medicine

## 2014-10-22 DIAGNOSIS — I872 Venous insufficiency (chronic) (peripheral): Secondary | ICD-10-CM | POA: Insufficient documentation

## 2014-10-22 NOTE — Progress Notes (Signed)
OFFICE NOTE  Chief Complaint:  Leg swelling  Primary Care Physician: Edgar Sa, ERIC, MD  HPI:  Edgar Brewer is a 54 year old male who is recently seen by Dr. [name], for evaluation of leg swelling, shortness of breath and chest discomfort. He ordered an echocardiogram and nuclear stress test. The echocardiogram showed an EF of 45-50%. The stress test subsequently a demonstrated no reversible ischemia. Medications were adjusted for elevated blood pressures which appear to be well-controlled today. He was noted to have significant lower extremity edema and venous stasis changes. He was referred to me for evaluation and management of leg swelling.  PMHx:  Past Medical History  Diagnosis Date  . Diabetes mellitus without complication     No past surgical history on file.  FAMHx:  Family History  Problem Relation Age of Onset  . Cancer Father     SOCHx:   reports that he has been smoking Cigarettes.  He has been smoking about 1.00 pack per day. He has never used smokeless tobacco. He reports that he drinks about 0.6 oz of alcohol per week. He reports that he does not use illicit drugs.  ALLERGIES:  Allergies  Allergen Reactions  . Codeine Hives and Swelling    Swelling all over body     ROS: A comprehensive review of systems was negative except for: Respiratory: positive for dyspnea on exertion Cardiovascular: positive for lower extremity edema  HOME MEDS: Current Outpatient Prescriptions  Medication Sig Dispense Refill  . Cholecalciferol (VITAMIN D3) 2000 UNITS capsule Take 2,000 Units by mouth daily.    . furosemide (LASIX) 20 MG tablet Take 20 mg by mouth daily.    Marland Kitchen gabapentin (NEURONTIN) 300 MG capsule Take 300 mg by mouth 3 (three) times daily.    Marland Kitchen glipiZIDE (GLUCOTROL) 10 MG tablet Take 10 mg by mouth 2 (two) times daily before a meal.    . ibuprofen (ADVIL,MOTRIN) 200 MG tablet Take 400 mg by mouth every 6 (six) hours as needed for moderate pain (pain).    .  insulin detemir (LEVEMIR) 100 UNIT/ML injection Inject 30 Units into the skin at bedtime.    Marland Kitchen losartan (COZAAR) 100 MG tablet Take 1 tablet (100 mg total) by mouth daily. 30 tablet 11  . naproxen (NAPROSYN) 500 MG tablet Take 500 mg by mouth 2 (two) times daily with a meal.    . PENTOXIFYLLINE ER PO Take 400 mg by mouth 2 (two) times daily.    . vitamin C (ASCORBIC ACID) 500 MG tablet Take 500 mg by mouth at bedtime.     No current facility-administered medications for this visit.    LABS/IMAGING: No results found for this or any previous visit (from the past 48 hour(s)). No results found.  WEIGHTS: Wt Readings from Last 3 Encounters:  10/21/14 207 lb 12.8 oz (94.257 kg)  08/25/14 196 lb 12.8 oz (89.268 kg)  06/24/14 200 lb (90.719 kg)    VITALS: BP 140/76 mmHg  Pulse 73  Ht 5\' 8"  (1.727 m)  Wt 207 lb 12.8 oz (94.257 kg)  BMI 31.60 kg/m2  EXAM: General appearance: alert and no distress Neck: no carotid bruit and no JVD Lungs: clear to auscultation bilaterally Heart: regular rate and rhythm, S1, S2 normal, no murmur, click, rub or gallop Abdomen: soft, non-tender; bowel sounds normal; no masses,  no organomegaly Extremities: edema 2+ right lower extremity and 3+ left lower extremity with erythema and woody stasis changes and venous stasis dermatitis noted Pulses: 2+ and symmetric  Skin: Skin color, texture, turgor normal. No rashes or lesions Neurologic: Grossly normal Psych: Appears mildly disheveled  EKG: 's normal sinus rhythm at 73, nonspecific T wave changes  ASSESSMENT: 1. Bilateral leg edema likely due to chronic venous insufficiency and peripheral neuropathy 2. Mild systolic congestive heart failure EF 45-50%  PLAN: 1.   [name] has bilateral leg edema which is probably due to a combination of venous insufficiency, peripheral neuropathy and mild congestive heart failure. He appears clinically compensated and is on Lasix 20 mg daily. He will likely benefit from  bilateral knee high compression stockings. I also like to order venous insufficiency studies for him. Should he have abnormal findings, will refer him to vein and vascular surgery for evaluation of venous ablation. I no longer perform these procedures in the office but I'm happy to see patients for chronic venous disease.  Thanks for the kind referral  Pixie Casino, MD, [facility] Attending Cardiologist [place] 10/22/2014, 6:04 PM

## 2014-10-27 ENCOUNTER — Ambulatory Visit (HOSPITAL_COMMUNITY)
Admission: RE | Admit: 2014-10-27 | Discharge: 2014-10-27 | Disposition: A | Discharge status: Home / Self Care | Payer: No Typology Code available for payment source | Source: Ambulatory Visit | Attending: Surgery | Admitting: Surgery

## 2014-10-27 DIAGNOSIS — M7989 Other specified soft tissue disorders: Secondary | ICD-10-CM | POA: Insufficient documentation

## 2014-10-27 DIAGNOSIS — M79606 Pain in leg, unspecified: Secondary | ICD-10-CM

## 2014-10-27 DIAGNOSIS — I83813 Varicose veins of bilateral lower extremities with pain: Secondary | ICD-10-CM | POA: Insufficient documentation

## 2014-10-27 DIAGNOSIS — E119 Type 2 diabetes mellitus without complications: Secondary | ICD-10-CM | POA: Insufficient documentation

## 2014-10-27 DIAGNOSIS — I83893 Varicose veins of bilateral lower extremities with other complications: Secondary | ICD-10-CM | POA: Insufficient documentation

## 2014-10-31 ENCOUNTER — Telehealth: Payer: Self-pay | Admitting: Internal Medicine

## 2014-10-31 DIAGNOSIS — I872 Venous insufficiency (chronic) (peripheral): Secondary | ICD-10-CM

## 2014-10-31 NOTE — Telephone Encounter (Signed)
Would like ultrasound results from 10-27-14 please.

## 2014-11-03 NOTE — Telephone Encounter (Signed)
She called again today,still waiting for results.

## 2014-11-04 NOTE — Telephone Encounter (Signed)
Spoke with mother, [name], who called in. Informed her that the result is not in the computer system for me to provide to them but I will call when this is available. She voiced understanding.

## 2014-11-11 NOTE — Addendum Note (Signed)
Addended by: Fidel Levy on: 11/11/2014 02:52 PM   Modules accepted: Orders

## 2014-11-11 NOTE — Telephone Encounter (Signed)
Dopplers indicated bilateral venous reflux. May be a candidate for venous ablation. Please refer to Dr. Donnetta Hutching at [facility] for evaluation and return patient phone call with results.  Dr. Debara Pickett

## 2014-11-11 NOTE — Telephone Encounter (Signed)
Spoke with patient and his mother and communicated results. Referral to [facility] placed.

## 2014-12-10 ENCOUNTER — Encounter: Payer: No Typology Code available for payment source | Admitting: Vascular Surgery

## 2014-12-24 ENCOUNTER — Encounter: Payer: Self-pay | Admitting: Vascular Surgery

## 2014-12-24 ENCOUNTER — Encounter: Payer: No Typology Code available for payment source | Admitting: Vascular Surgery

## 2014-12-24 ENCOUNTER — Ambulatory Visit (INDEPENDENT_AMBULATORY_CARE_PROVIDER_SITE_OTHER): Payer: No Typology Code available for payment source | Admitting: Vascular Surgery

## 2014-12-24 VITALS — BP 151/76 | HR 79 | Temp 97.4°F | Resp 18 | Ht 68.0 in | Wt 214.5 lb

## 2014-12-24 DIAGNOSIS — I83899 Varicose veins of unspecified lower extremities with other complications: Secondary | ICD-10-CM

## 2014-12-24 DIAGNOSIS — I83893 Varicose veins of bilateral lower extremities with other complications: Secondary | ICD-10-CM

## 2014-12-24 HISTORY — DX: Varicose veins of unspecified lower extremity with other complications: I83.899

## 2014-12-24 NOTE — Progress Notes (Signed)
Referred by:  [name] Blocker, MD [address] [place], [place] [address]  Reason for referral: bilateral leg swelling   History of Present Illness  Edgar Brewer is a 54 y.o. ([birth year]) male who presents with chief complaint: bilateral leg swelling with pain.  Patient notes, onset of swelling years ago, associated with no obvious trigger.  The patient's symptoms include: legs swelling with aching.  He also notes some pruritus and numbness in feet.   The patient has previous developed blistering in both legs that have spontaneously drained.  The patient has had no history of DVT, no history of pregnancy, known history of varicose vein, known history of venous stasis ulcers, no history of  Lymphedema and known history of skin changes in lower legs.  There is no family history of venous disorders.  The patient has never used compression stockings in the past.   Past Medical History  Diagnosis Date  . Diabetes mellitus without complication ([place])     History reviewed. No pertinent past surgical history.  Social History   Social History  . Marital Status: Legally Separated    Spouse Name: N/A  . Number of Children: N/A  . Years of Education: N/A   Occupational History  . Not on file.   Social History Main Topics  . Smoking status: Current Every Day Smoker -- 1.00 packs/day    Types: Cigarettes  . Smokeless tobacco: Never Used  . Alcohol Use: 0.6 oz/week    1 Glasses of wine per week     Comment: rare occassion  . Drug Use: No  . Sexual Activity: Not on file   Other Topics Concern  . Not on file   Social History Narrative    Family History  Problem Relation Age of Onset  . Cancer Father     Current Outpatient Prescriptions  Medication Sig Dispense Refill  . Cholecalciferol (VITAMIN D3) 2000 UNITS capsule Take 2,000 Units by mouth daily.    . furosemide (LASIX) 20 MG tablet Take 20 mg by mouth daily.    Marland Kitchen gabapentin (NEURONTIN) 300 MG capsule Take 300 mg by  mouth 3 (three) times daily.    Marland Kitchen glipiZIDE (GLUCOTROL) 10 MG tablet Take 10 mg by mouth 2 (two) times daily before a meal.    . ibuprofen (ADVIL,MOTRIN) 200 MG tablet Take 400 mg by mouth every 6 (six) hours as needed for moderate pain (pain).    . insulin detemir (LEVEMIR) 100 UNIT/ML injection Inject 30 Units into the skin at bedtime.    Marland Kitchen losartan (COZAAR) 100 MG tablet Take 1 tablet (100 mg total) by mouth daily. 30 tablet 11  . naproxen (NAPROSYN) 500 MG tablet Take 500 mg by mouth 2 (two) times daily with a meal.    . PENTOXIFYLLINE ER PO Take 400 mg by mouth 2 (two) times daily.    . vitamin C (ASCORBIC ACID) 500 MG tablet Take 500 mg by mouth at bedtime.     No current facility-administered medications for this visit.    Allergies  Allergen Reactions  . Codeine Hives and Swelling    Swelling all over body      REVIEW OF SYSTEMS:  (Positives checked otherwise negative)  CARDIOVASCULAR:   [ ]  chest pain,  [ ]  chest pressure,  [ ]  palpitations,  [ ]  shortness of breath when laying flat,  [x]  shortness of breath with exertion,   [x]  pain in feet when walking,  [x]  pain in feet when  laying flat, [ ]  history of blood clot in veins (DVT),  [ ]  history of phlebitis,  [x]  swelling in legs,  [ ]  varicose veins  PULMONARY:   [x]  productive cough,  [x]  oxygen at home, [x]  wheezing  NEUROLOGIC:   [ ]  weakness in arms or legs,  [ ]  numbness in arms or legs,  [ ]  difficulty speaking or slurred speech,  [ ]  temporary loss of vision in one eye,  [ ]  dizziness  HEMATOLOGIC:   [ ]  bleeding problems,  [ ]  problems with blood clotting too easily  MUSCULOSKEL:   [ ]  joint pain, [ ]  joint swelling  GASTROINTEST:   [ ]  vomiting blood,  [ ]  blood in stool     GENITOURINARY:   [ ]  burning with urination,  [ ]  blood in urine  PSYCHIATRIC:   [ ]  history of major depression  INTEGUMENTARY:   [ ]  rashes,  [ ]  ulcers  CONSTITUTIONAL:   [ ]  fever,  [ ]   chills   Physical Examination  Filed Vitals:   12/24/14 1152 12/24/14 1155  BP: 152/78 151/76  Pulse: 79   Temp: 97.4 F (36.3 C)   TempSrc: Oral   Resp: 18   Height: 5\' 8"  (1.727 m)   Weight: 214 lb 8 oz (97.297 kg)   SpO2: 96%    Body mass index is 32.62 kg/(m^2).  General: A&O x 3, WD, Obese,   Head: [place]/AT  Ear/Nose/Throat: Hearing grossly intact, nares without erythema or drainage, oropharynx without Erythema/Exudate, Mallampati score: 3  Eyes: PERRLA, EOMI  Neck: Supple, no nuchal rigidity, no palpable LAD  Pulmonary: Sym exp, good air movt, CTAB, no rales, rhonchi, & wheezing  Cardiac: RRR, Nl S1, S2, no Murmurs, rubs or gallops  Vascular: Vessel Right Left  Radial Palpable Palpable  Brachial Palpable Palpable  Carotid Palpable, without bruit Palpable, without bruit  Aorta Not palpable N/A  Femoral Palpable Palpable  Popliteal Not palpable Not palpable  PT Not Palpable Not Palpable  DP Faintly palpable Faintly palpable   Gastrointestinal: soft, NTND, no G/R, no HSM, no masses, no CVAT B  Musculoskeletal: M/S 5/5 throughout , Extremities without ischemic changes , BLE edema 2+, B extensive LDS, healing ulcers in R anterior shin, L calf blisterering  Neurologic: CN 2-12 intact , Pain and light touch intact in extremities except decreased in feet, Motor exam as listed above  Psychiatric: Judgment intact, Mood & affect appropriate for pt's clinical situation  Dermatologic: See M/S exam for extremity exam, no rashes otherwise noted  Lymph : No Cervical, Axillary, or Inguinal lymphadenopathy    Non-Invasive Vascular Imaging  BLE Venous Insufficiency Duplex (Date: 12/24/2014):   RLE:   no DVT and SVT,   + GSV reflux,   no SSV reflux,  + deep venous reflux: CFV, FV, pop V, SFJ  LLE:  no DVT and SVT,   + GSV reflux,   no SSV reflux,  + deep venous reflux: CFV, FV, pop V, SFJ    Medical Decision Making  Edgar Brewer is a 54 y.o. male  who presents with: BLE chronic venous insufficiency (C6), varicose veins with complications, healing VSU   Based on the patient's history and examination, I recommend: compressive therapy.  I discussed with the patient the use of her 20-30 mm thigh high compression stockings and need for 3 month trial of such.  The patient will follow up in 3 months with Dr. Donnetta Hutching (per Dr. Lysbeth Penner request) in the  [facility] for evaluation for: L GSV EVLA followed by R GSV EVLA.  Thank you for allowing [place] to participate in this patient's care.   Adele Barthel, MD [facility] Office: [phone] Pager: [phone]  12/24/2014, 12:23 PM

## 2015-01-12 ENCOUNTER — Encounter (HOSPITAL_COMMUNITY): Payer: Self-pay | Admitting: Emergency Medicine

## 2015-01-12 ENCOUNTER — Inpatient Hospital Stay (HOSPITAL_COMMUNITY)
Admission: EM | Admit: 2015-01-12 | Discharge: 2015-01-17 | DRG: 291 | Disposition: A | Discharge status: Home Health | Payer: Self-pay | Attending: Internal Medicine | Admitting: Internal Medicine

## 2015-01-12 DIAGNOSIS — Y92481 Parking lot as the place of occurrence of the external cause: Secondary | ICD-10-CM

## 2015-01-12 DIAGNOSIS — Z885 Allergy status to narcotic agent status: Secondary | ICD-10-CM

## 2015-01-12 DIAGNOSIS — E1159 Type 2 diabetes mellitus with other circulatory complications: Secondary | ICD-10-CM | POA: Diagnosis present

## 2015-01-12 DIAGNOSIS — E162 Hypoglycemia, unspecified: Secondary | ICD-10-CM

## 2015-01-12 DIAGNOSIS — G9340 Encephalopathy, unspecified: Secondary | ICD-10-CM | POA: Diagnosis present

## 2015-01-12 DIAGNOSIS — N183 Chronic kidney disease, stage 3 (moderate): Secondary | ICD-10-CM | POA: Diagnosis present

## 2015-01-12 DIAGNOSIS — W1830XA Fall on same level, unspecified, initial encounter: Secondary | ICD-10-CM | POA: Diagnosis present

## 2015-01-12 DIAGNOSIS — D631 Anemia in chronic kidney disease: Secondary | ICD-10-CM | POA: Diagnosis present

## 2015-01-12 DIAGNOSIS — E1140 Type 2 diabetes mellitus with diabetic neuropathy, unspecified: Secondary | ICD-10-CM | POA: Diagnosis present

## 2015-01-12 DIAGNOSIS — I10 Essential (primary) hypertension: Secondary | ICD-10-CM | POA: Diagnosis present

## 2015-01-12 DIAGNOSIS — E1165 Type 2 diabetes mellitus with hyperglycemia: Secondary | ICD-10-CM | POA: Diagnosis present

## 2015-01-12 DIAGNOSIS — Z791 Long term (current) use of non-steroidal anti-inflammatories (NSAID): Secondary | ICD-10-CM

## 2015-01-12 DIAGNOSIS — I83899 Varicose veins of unspecified lower extremities with other complications: Secondary | ICD-10-CM | POA: Diagnosis present

## 2015-01-12 DIAGNOSIS — Z79899 Other long term (current) drug therapy: Secondary | ICD-10-CM

## 2015-01-12 DIAGNOSIS — E11649 Type 2 diabetes mellitus with hypoglycemia without coma: Secondary | ICD-10-CM | POA: Diagnosis present

## 2015-01-12 DIAGNOSIS — I8393 Asymptomatic varicose veins of bilateral lower extremities: Secondary | ICD-10-CM | POA: Diagnosis present

## 2015-01-12 DIAGNOSIS — N179 Acute kidney failure, unspecified: Secondary | ICD-10-CM | POA: Diagnosis present

## 2015-01-12 DIAGNOSIS — R339 Retention of urine, unspecified: Secondary | ICD-10-CM | POA: Diagnosis present

## 2015-01-12 DIAGNOSIS — N184 Chronic kidney disease, stage 4 (severe): Secondary | ICD-10-CM | POA: Diagnosis present

## 2015-01-12 DIAGNOSIS — E669 Obesity, unspecified: Secondary | ICD-10-CM | POA: Diagnosis present

## 2015-01-12 DIAGNOSIS — E1121 Type 2 diabetes mellitus with diabetic nephropathy: Secondary | ICD-10-CM | POA: Diagnosis present

## 2015-01-12 DIAGNOSIS — I5043 Acute on chronic combined systolic (congestive) and diastolic (congestive) heart failure: Principal | ICD-10-CM | POA: Insufficient documentation

## 2015-01-12 DIAGNOSIS — E876 Hypokalemia: Secondary | ICD-10-CM | POA: Diagnosis present

## 2015-01-12 DIAGNOSIS — I130 Hypertensive heart and chronic kidney disease with heart failure and stage 1 through stage 4 chronic kidney disease, or unspecified chronic kidney disease: Secondary | ICD-10-CM | POA: Diagnosis present

## 2015-01-12 DIAGNOSIS — O2230 Deep phlebothrombosis in pregnancy, unspecified trimester: Secondary | ICD-10-CM

## 2015-01-12 DIAGNOSIS — R0602 Shortness of breath: Secondary | ICD-10-CM

## 2015-01-12 DIAGNOSIS — I878 Other specified disorders of veins: Secondary | ICD-10-CM | POA: Diagnosis present

## 2015-01-12 DIAGNOSIS — E118 Type 2 diabetes mellitus with unspecified complications: Secondary | ICD-10-CM | POA: Diagnosis present

## 2015-01-12 DIAGNOSIS — Z6832 Body mass index (BMI) 32.0-32.9, adult: Secondary | ICD-10-CM

## 2015-01-12 DIAGNOSIS — I872 Venous insufficiency (chronic) (peripheral): Secondary | ICD-10-CM | POA: Diagnosis present

## 2015-01-12 DIAGNOSIS — R319 Hematuria, unspecified: Secondary | ICD-10-CM | POA: Diagnosis present

## 2015-01-12 DIAGNOSIS — E119 Type 2 diabetes mellitus without complications: Secondary | ICD-10-CM | POA: Diagnosis present

## 2015-01-12 DIAGNOSIS — I5032 Chronic diastolic (congestive) heart failure: Secondary | ICD-10-CM | POA: Diagnosis present

## 2015-01-12 DIAGNOSIS — N189 Chronic kidney disease, unspecified: Secondary | ICD-10-CM

## 2015-01-12 DIAGNOSIS — F17200 Nicotine dependence, unspecified, uncomplicated: Secondary | ICD-10-CM | POA: Diagnosis present

## 2015-01-12 DIAGNOSIS — Z794 Long term (current) use of insulin: Secondary | ICD-10-CM

## 2015-01-12 DIAGNOSIS — Z809 Family history of malignant neoplasm, unspecified: Secondary | ICD-10-CM

## 2015-01-12 DIAGNOSIS — E1122 Type 2 diabetes mellitus with diabetic chronic kidney disease: Secondary | ICD-10-CM | POA: Diagnosis present

## 2015-01-12 HISTORY — DX: Polyneuropathy, unspecified: G62.9

## 2015-01-12 HISTORY — DX: Varicose veins of unspecified lower extremity with other complications: I83.899

## 2015-01-12 HISTORY — DX: Acute on chronic combined systolic (congestive) and diastolic (congestive) heart failure: I50.43

## 2015-01-12 LAB — BASIC METABOLIC PANEL
Anion gap: 11 (ref 5–15)
BUN: 29 mg/dL — AB (ref 6–20)
CHLORIDE: 107 mmol/L (ref 101–111)
CO2: 24 mmol/L (ref 22–32)
Calcium: 8.4 mg/dL — ABNORMAL LOW (ref 8.9–10.3)
Creatinine, Ser: 2.11 mg/dL — ABNORMAL HIGH (ref 0.61–1.24)
GFR calc Af Amer: 39 mL/min — ABNORMAL LOW (ref >60)
GFR calc non Af Amer: 34 mL/min — ABNORMAL LOW (ref >60)
GLUCOSE: 39 mg/dL — AB (ref 65–99)
POTASSIUM: 3.2 mmol/L — AB (ref 3.5–5.1)
Sodium: 142 mmol/L (ref 135–145)

## 2015-01-12 LAB — CBG MONITORING, ED
GLUCOSE-CAPILLARY: 41 mg/dL — AB (ref 65–99)
GLUCOSE-CAPILLARY: 93 mg/dL (ref 65–99)

## 2015-01-12 LAB — CBC
HEMATOCRIT: 35.1 % — AB (ref 39.0–52.0)
HEMOGLOBIN: 11.8 g/dL — AB (ref 13.0–17.0)
MCH: 29.3 pg (ref 26.0–34.0)
MCHC: 33.6 g/dL (ref 30.0–36.0)
MCV: 87.1 fL (ref 78.0–100.0)
Platelets: 274 10*3/uL (ref 150–400)
RBC: 4.03 MIL/uL — ABNORMAL LOW (ref 4.22–5.81)
RDW: 13.7 % (ref 11.5–15.5)
WBC: 9.4 10*3/uL (ref 4.0–10.5)

## 2015-01-12 MED ORDER — DEXTROSE 50 % IV SOLN: 1.0000 | Freq: Once | INTRAVENOUS | Status: DC

## 2015-01-12 MED ORDER — ONDANSETRON HCL 4 MG/2ML IJ SOLN
4.0000 mg | Freq: Once | INTRAMUSCULAR | Status: AC
  Administered 2015-01-12: 4 mg via INTRAVENOUS
  Filled 2015-01-12: qty 2

## 2015-01-12 MED ORDER — DEXTROSE 50 % IV SOLN
INTRAVENOUS | Status: AC
  Administered 2015-01-12: 50 mL via INTRAVENOUS
  Filled 2015-01-12: qty 50

## 2015-01-12 MED ORDER — DEXTROSE 50 % IV SOLN
1.0000 | Freq: Once | INTRAVENOUS | Status: AC
  Administered 2015-01-12: 50 mL via INTRAVENOUS

## 2015-01-12 NOTE — ED Notes (Signed)
Dr. Betsey Holiday made aware that patient feels slight nausea, "knot in my stomach". Administered Zofran, will re-check.  Patient made aware he needs to eat.

## 2015-01-12 NOTE — ED Notes (Signed)
Improvement noted to orientation level after D50 administration.  Patient alert and oriented x4 and speaking without difficulty or delayed response.

## 2015-01-12 NOTE — ED Provider Notes (Signed)
CSN: [id number]     Arrival date & time 01/12/15  2156 History  By signing my name below, I, Arianna Nassar, attest that this documentation has been prepared under the direction and in the presence of Orpah Greek, MD. Electronically Signed: Julien Nordmann, ED Scribe. 01/12/2015. 11:21 PM.    Chief Complaint  Patient presents with  . Hypoglycemia      The history is provided by the patient. No language interpreter was used.   HPI Comments: Rey Kuczek is a 54 y.o. male who has a hx of DM and neuropathy presents to the Emergency Department complaining of acute onset hypoglycemia onset this evening. Pt states he was at work when he felt unstable and shaky and fell in the parking lot. He states he has difficulty lifting heavy objects without feeling short of breath. Mother notes pt only ate lunch today and she is unsure if that was the reason for his fall. He reports he has not been sick lately. Pt denies shortness of breath, vomiting, diarrhea, nausea, cough, and congestion.   Past Medical History  Diagnosis Date  . Diabetes mellitus without complication ([place])   . Neuropathy ([place])    History reviewed. No pertinent past surgical history. Family History  Problem Relation Age of Onset  . Cancer Father    Social History  Substance Use Topics  . Smoking status: Current Every Day Smoker -- 1.00 packs/day    Types: Cigarettes  . Smokeless tobacco: Never Used  . Alcohol Use: 0.6 oz/week    1 Glasses of wine per week     Comment: rare occassion    Review of Systems  Constitutional: Negative for fever.  HENT: Negative for congestion.   Respiratory: Negative for cough.   Gastrointestinal: Negative for nausea, vomiting and diarrhea.  All other systems reviewed and are negative.     Allergies  Codeine  Home Medications   Prior to Admission medications   Medication Sig Start Date End Date Taking? Authorizing Provider  acetaminophen (TYLENOL) 500 MG tablet Take 500 mg by  mouth every 6 (six) hours as needed for mild pain.   Yes Historical Provider, MD  Cholecalciferol (VITAMIN D3) 2000 UNITS capsule Take 2,000 Units by mouth daily.   Yes Historical Provider, MD  gabapentin (NEURONTIN) 300 MG capsule Take 300 mg by mouth 3 (three) times daily.   Yes Historical Provider, MD  glipiZIDE (GLUCOTROL) 10 MG tablet Take 10 mg by mouth 2 (two) times daily before a meal.   Yes Historical Provider, MD  ibuprofen (ADVIL,MOTRIN) 200 MG tablet Take 400 mg by mouth every 6 (six) hours as needed for moderate pain (pain).   Yes Historical Provider, MD  insulin detemir (LEVEMIR) 100 UNIT/ML injection Inject 30 Units into the skin at bedtime.   Yes Historical Provider, MD  losartan (COZAAR) 100 MG tablet Take 1 tablet (100 mg total) by mouth daily. 06/25/14  Yes Josue Hector, MD  naproxen (NAPROSYN) 500 MG tablet Take 500 mg by mouth 2 (two) times daily with a meal.   Yes Historical Provider, MD  PENTOXIFYLLINE ER PO Take 400 mg by mouth 2 (two) times daily.   Yes Historical Provider, MD  vitamin C (ASCORBIC ACID) 500 MG tablet Take 500 mg by mouth at bedtime.   Yes Historical Provider, MD   Triage vitals: BP 190/89 mmHg  Pulse 89  Temp(Src) 97.5 F (36.4 C) (Oral)  Resp 18  Wt 215 lb (97.523 kg)  SpO2 97% Physical Exam  Constitutional:  He is oriented to person, place, and time. He appears well-developed and well-nourished. No distress.  HENT:  Head: Normocephalic and atraumatic.  Right Ear: Hearing normal.  Left Ear: Hearing normal.  Nose: Nose normal.  Mouth/Throat: Oropharynx is clear and moist and mucous membranes are normal.  Eyes: Conjunctivae and EOM are normal. Pupils are equal, round, and reactive to light.  Neck: Normal range of motion. Neck supple.  Cardiovascular: Regular rhythm, S1 normal and S2 normal.  Exam reveals no gallop and no friction rub.   No murmur heard. Pulmonary/Chest: Effort normal and breath sounds normal. No respiratory distress. He exhibits  no tenderness.  Abdominal: Soft. Normal appearance and bowel sounds are normal. There is no hepatosplenomegaly. There is no tenderness. There is no rebound, no guarding, no tenderness at McBurney's point and negative Murphy's sign. No hernia.  Musculoskeletal: Normal range of motion. He exhibits edema.  Bilateral edema venus statis dermatitis   Neurological: He is alert and oriented to person, place, and time. He has normal strength. No cranial nerve deficit or sensory deficit. Coordination normal. GCS eye subscore is 4. GCS verbal subscore is 5. GCS motor subscore is 6.  Skin: Skin is warm, dry and intact. No rash noted. No cyanosis.  Psychiatric: He has a normal mood and affect. His speech is normal and behavior is normal. Thought content normal.  Nursing note and vitals reviewed.   ED Course  Procedures  DIAGNOSTIC STUDIES: Oxygen Saturation is 97% on RA, normal by my interpretation.  COORDINATION OF CARE:  11:18 PM Discussed treatment plan which includes monitor glucose and lab work with pt at bedside and pt agreed to plan.  Labs Review Labs Reviewed  CBC - Abnormal; Notable for the following:    RBC 4.03 (*)    Hemoglobin 11.8 (*)    HCT 35.1 (*)    All other components within normal limits  BASIC METABOLIC PANEL - Abnormal; Notable for the following:    Potassium 3.2 (*)    Glucose, Bld 39 (*)    BUN 29 (*)    Creatinine, Ser 2.11 (*)    Calcium 8.4 (*)    GFR calc non Af Amer 34 (*)    GFR calc Af Amer 39 (*)    All other components within normal limits  URINALYSIS, ROUTINE W REFLEX MICROSCOPIC (NOT AT [name]) - Abnormal; Notable for the following:    Hgb urine dipstick LARGE (*)    Protein, ur >300 (*)    All other components within normal limits  URINE MICROSCOPIC-ADD ON - Abnormal; Notable for the following:    Squamous Epithelial / LPF 0-5 (*)    Bacteria, UA FEW (*)    Casts HYALINE CASTS (*)    All other components within normal limits  CBG MONITORING, ED -  Abnormal; Notable for the following:    Glucose-Capillary 41 (*)    All other components within normal limits  CBG MONITORING, ED - Abnormal; Notable for the following:    Glucose-Capillary 61 (*)    All other components within normal limits  CBG MONITORING, ED - Abnormal; Notable for the following:    Glucose-Capillary 153 (*)    All other components within normal limits  BRAIN NATRIURETIC PEPTIDE  CBG MONITORING, ED  CBG MONITORING, ED  CBG MONITORING, ED    Imaging Review Ct Renal Stone Study  01/13/2015  CLINICAL DATA:  Hematuria and epigastric pain. EXAM: CT ABDOMEN AND PELVIS WITHOUT CONTRAST TECHNIQUE: Multidetector CT imaging of the abdomen  and pelvis was performed following the standard protocol without IV contrast. COMPARISON:  None. FINDINGS: Lower chest and abdominal wall:  Anasarca. Prominent inguinal lymph nodes, benign-appearing. Ovoid subcutaneous nodule anterior to the left gluteal musculature, 27 mm maximal diameter, likely dermal inclusion cyst. Small layering pleural effusions and trace pericardial effusion. Polygonal opacity over the right diaphragm is likely atelectasis. Hepatobiliary: No focal liver abnormality.Multiple calcified stones within the partially collapsed gallbladder. Pancreas: Unremarkable. Spleen: 15 mm low-density lesion subcapsular and anterior, usually incidental in isolation. Adrenals/Urinary Tract: Negative adrenals. Symmetric nonspecific perinephric edema. No hydronephrosis or stone. No evidence of mass lesion. No venous expansion or high-density. Unremarkable bladder. Reproductive:No pathologic findings. Stomach/Bowel: No obstruction. No appendicitis. Extensive for age colonic diverticulosis. Vascular/Lymphatic: No acute vascular abnormality. Borderline retroperitoneal adenopathy without specific pattern. Peritoneal: No ascites or pneumoperitoneum. Musculoskeletal: No acute abnormalities. IMPRESSION: 1. No explanation for hematuria. 2. Volume overload. 3.  Cholelithiasis.  Colonic diverticulosis. Electronically Signed   By: Monte Fantasia M.D.   On: 01/13/2015 03:41   I have personally reviewed and evaluated these images and lab results as part of my medical decision-making.   EKG Interpretation   Date/Time:  Monday January 12 2015 22:50:07 EST Ventricular Rate:  88 PR Interval:  198 QRS Duration: 103 QT Interval:  411 QTC Calculation: 497 R Axis:   58 Text Interpretation:  Sinus rhythm Consider left atrial enlargement  Abnormal inferior Q waves Borderline T wave abnormalities Borderline  prolonged QT interval No significant change since last tracing Confirmed  by Katelen Luepke  MD, Michele Kerlin [phone]) on 01/13/2015 1:44:46 AM      MDM   Final diagnoses:  Acute on chronic combined systolic and diastolic congestive heart failure (HCC)  Hematuria  AKI (acute kidney injury) ([place])  Hypoglycemia    This is a complicated patient that he is a very poor historian. Patient originally came to the emergency room with complaints of weakness, dizziness and possibly syncope secondary to hypoglycemia. Patient did not eat dinner tonight. He does take insulin as well as Glucotrol. He did have hypoglycemia that was responsive to IV dextrose. Patient has had a second episode of hypoglycemia that has responded to additional IV dextrose and eating a meal. Sugar has stabilized.  Patient noted to have multiple other issues during the evaluation, however. Patient complains of swelling of his legs and abdomen. He does have a history of chronic lower extremity swelling secondary to venous insufficiency, but looking through his records he also has a history of congestive heart failure. Patient had recent echo which did show ejection fraction of [id number] with diastolic dysfunction. Reviewing his records shows that he had been maintained on Lasix 20 mg daily, but he reports that he ran out of it and was unable to afford any for the last several weeks.  Patient  complaining of epigastric abdominal discomfort. He also was noted to have hematuria on urinalysis. CT scan was performed to further evaluate. No ureterolithiasis or hydronephrosis is noted. No intra-abdominal pathology noted, patient is noted to have bilateral pleural effusions and pericardial effusion, consistent with volume overload.  Patient's renal function has worsened despite being off of his Lasix. He will require hospitalization for initiation of diuresis for volume overload, monitoring of kidney function and blood sugars.  I personally performed the services described in this documentation, which was scribed in my presence. The recorded information has been reviewed and is accurate.   Orpah Greek, MD 01/13/15 0500

## 2015-01-12 NOTE — ED Notes (Signed)
Per mother patient fell while at work.  Patient is unsure of why or how he fell.  Mother was called and brought patient to ED.  Patient slow to respond per to mother and having difficulty walking.

## 2015-01-12 NOTE — ED Notes (Signed)
Patient undressed and placed in bed, mother at bedside. Patient diaphoretic.  Attempted to ask patient orientation questions.  Patient very slow to respond and only able to answer where he is and who he is.

## 2015-01-13 ENCOUNTER — Inpatient Hospital Stay (HOSPITAL_COMMUNITY): Payer: No Typology Code available for payment source

## 2015-01-13 ENCOUNTER — Encounter (HOSPITAL_COMMUNITY): Payer: Self-pay | Admitting: Internal Medicine

## 2015-01-13 ENCOUNTER — Emergency Department (HOSPITAL_COMMUNITY): Payer: Self-pay

## 2015-01-13 ENCOUNTER — Inpatient Hospital Stay (HOSPITAL_COMMUNITY): Payer: Self-pay

## 2015-01-13 DIAGNOSIS — I5032 Chronic diastolic (congestive) heart failure: Secondary | ICD-10-CM | POA: Diagnosis present

## 2015-01-13 DIAGNOSIS — I5043 Acute on chronic combined systolic (congestive) and diastolic (congestive) heart failure: Secondary | ICD-10-CM

## 2015-01-13 DIAGNOSIS — I872 Venous insufficiency (chronic) (peripheral): Secondary | ICD-10-CM

## 2015-01-13 DIAGNOSIS — R319 Hematuria, unspecified: Secondary | ICD-10-CM | POA: Diagnosis present

## 2015-01-13 HISTORY — DX: Acute on chronic combined systolic (congestive) and diastolic (congestive) heart failure: I50.43

## 2015-01-13 LAB — GLUCOSE, CAPILLARY
GLUCOSE-CAPILLARY: 166 mg/dL — AB (ref 65–99)
Glucose-Capillary: 186 mg/dL — ABNORMAL HIGH (ref 65–99)
Glucose-Capillary: 110 mg/dL — ABNORMAL HIGH (ref 65–99)

## 2015-01-13 LAB — URINALYSIS, ROUTINE W REFLEX MICROSCOPIC
Bilirubin Urine: NEGATIVE
GLUCOSE, UA: NEGATIVE mg/dL
Ketones, ur: NEGATIVE mg/dL
LEUKOCYTES UA: NEGATIVE
NITRITE: NEGATIVE
PH: 6.5 (ref 5.0–8.0)
Protein, ur: >300 mg/dL — AB
SPECIFIC GRAVITY, URINE: 1.021 (ref 1.005–1.030)

## 2015-01-13 LAB — URINE MICROSCOPIC-ADD ON

## 2015-01-13 LAB — CBG MONITORING, ED
GLUCOSE-CAPILLARY: 153 mg/dL — AB (ref 65–99)
Glucose-Capillary: 104 mg/dL — ABNORMAL HIGH (ref 65–99)
Glucose-Capillary: 117 mg/dL — ABNORMAL HIGH (ref 65–99)
Glucose-Capillary: 61 mg/dL — ABNORMAL LOW (ref 65–99)
Glucose-Capillary: 69 mg/dL (ref 65–99)

## 2015-01-13 LAB — PROTIME-INR
INR: 1.08 (ref 0.00–1.49)
PROTHROMBIN TIME: 14.2 s (ref 11.6–15.2)

## 2015-01-13 LAB — CBC
HEMATOCRIT: 32.3 % — AB (ref 39.0–52.0)
Hemoglobin: 10.8 g/dL — ABNORMAL LOW (ref 13.0–17.0)
MCH: 29.4 pg (ref 26.0–34.0)
MCHC: 33.4 g/dL (ref 30.0–36.0)
MCV: 88 fL (ref 78.0–100.0)
Platelets: 241 10*3/uL (ref 150–400)
RBC: 3.67 MIL/uL — AB (ref 4.22–5.81)
RDW: 14 % (ref 11.5–15.5)
WBC: 7.3 10*3/uL (ref 4.0–10.5)

## 2015-01-13 LAB — CREATININE, SERUM
Creatinine, Ser: 2.15 mg/dL — ABNORMAL HIGH (ref 0.61–1.24)
GFR calc non Af Amer: 33 mL/min — ABNORMAL LOW (ref >60)
GFR, EST AFRICAN AMERICAN: 38 mL/min — AB (ref >60)

## 2015-01-13 LAB — BRAIN NATRIURETIC PEPTIDE: B Natriuretic Peptide: 405.4 pg/mL — ABNORMAL HIGH (ref 0.0–100.0)

## 2015-01-13 LAB — TSH: TSH: 1.256 u[IU]/mL (ref 0.350–4.500)

## 2015-01-13 LAB — AMMONIA: AMMONIA: 42 umol/L — AB (ref 9–35)

## 2015-01-13 MED ORDER — INSULIN ASPART 100 UNIT/ML ~~LOC~~ SOLN
0.0000 [IU] | Freq: Three times a day (TID) | SUBCUTANEOUS | Status: DC
Start: 2015-01-13 — End: 2015-01-17
  Administered 2015-01-13: 2 [IU] via SUBCUTANEOUS
  Administered 2015-01-14: 1 [IU] via SUBCUTANEOUS
  Administered 2015-01-14 (×2): 2 [IU] via SUBCUTANEOUS
  Administered 2015-01-15: 1 [IU] via SUBCUTANEOUS
  Administered 2015-01-16 (×3): 2 [IU] via SUBCUTANEOUS

## 2015-01-13 MED ORDER — GUAIFENESIN-DM 100-10 MG/5ML PO SYRP: 5.0000 mL | ORAL_SOLUTION | ORAL | Status: DC | PRN

## 2015-01-13 MED ORDER — INSULIN ASPART 100 UNIT/ML ~~LOC~~ SOLN
0.0000 [IU] | SUBCUTANEOUS | Status: DC
Start: 2015-01-13 — End: 2015-01-13
  Administered 2015-01-13: 2 [IU] via SUBCUTANEOUS

## 2015-01-13 MED ORDER — FUROSEMIDE 10 MG/ML IJ SOLN: 40.0000 mg | Freq: Once | INTRAMUSCULAR | Status: DC

## 2015-01-13 MED ORDER — HYDRALAZINE HCL 50 MG PO TABS
50.0000 mg | ORAL_TABLET | Freq: Three times a day (TID) | ORAL | Status: DC
  Administered 2015-01-13 – 2015-01-17 (×13): 50 mg via ORAL
  Filled 2015-01-13 (×18): qty 1

## 2015-01-13 MED ORDER — ONDANSETRON HCL 4 MG PO TABS: 4.0000 mg | ORAL_TABLET | Freq: Four times a day (QID) | ORAL | Status: DC | PRN

## 2015-01-13 MED ORDER — HEPARIN SODIUM (PORCINE) 5000 UNIT/ML IJ SOLN
5000.0000 [IU] | Freq: Three times a day (TID) | INTRAMUSCULAR | Status: DC
  Administered 2015-01-13 – 2015-01-17 (×12): 5000 [IU] via SUBCUTANEOUS
  Filled 2015-01-13 (×14): qty 1

## 2015-01-13 MED ORDER — VITAMIN D3 25 MCG (1000 UNIT) PO TABS
2000.0000 [IU] | ORAL_TABLET | Freq: Every day | ORAL | Status: DC
  Administered 2015-01-13 – 2015-01-17 (×5): 2000 [IU] via ORAL
  Filled 2015-01-13 (×5): qty 2

## 2015-01-13 MED ORDER — FUROSEMIDE 10 MG/ML IJ SOLN
40.0000 mg | Freq: Two times a day (BID) | INTRAMUSCULAR | Status: DC
  Administered 2015-01-13 – 2015-01-16 (×6): 40 mg via INTRAVENOUS
  Filled 2015-01-13 (×6): qty 4

## 2015-01-13 MED ORDER — TAMSULOSIN HCL 0.4 MG PO CAPS
0.4000 mg | ORAL_CAPSULE | Freq: Every day | ORAL | Status: DC
  Administered 2015-01-13 – 2015-01-17 (×5): 0.4 mg via ORAL
  Filled 2015-01-13 (×5): qty 1

## 2015-01-13 MED ORDER — GLIPIZIDE 5 MG PO TABS
5.0000 mg | ORAL_TABLET | Freq: Two times a day (BID) | ORAL | Status: DC
  Administered 2015-01-13 – 2015-01-14 (×2): 5 mg via ORAL
  Filled 2015-01-13 (×3): qty 1

## 2015-01-13 MED ORDER — POTASSIUM CHLORIDE CRYS ER 20 MEQ PO TBCR
40.0000 meq | EXTENDED_RELEASE_TABLET | ORAL | Status: AC
  Administered 2015-01-13 (×2): 40 meq via ORAL
  Filled 2015-01-13 (×2): qty 2

## 2015-01-13 MED ORDER — FUROSEMIDE 10 MG/ML IJ SOLN
40.0000 mg | Freq: Once | INTRAMUSCULAR | Status: AC
  Administered 2015-01-13: 40 mg via INTRAVENOUS
  Filled 2015-01-13: qty 4

## 2015-01-13 MED ORDER — DEXTROSE 50 % IV SOLN
50.0000 mL | Freq: Once | INTRAVENOUS | Status: AC
  Administered 2015-01-13: 50 mL via INTRAVENOUS
  Filled 2015-01-13: qty 50

## 2015-01-13 MED ORDER — VITAMIN C 500 MG PO TABS
500.0000 mg | ORAL_TABLET | Freq: Every day | ORAL | Status: DC
  Administered 2015-01-13 – 2015-01-16 (×4): 500 mg via ORAL
  Filled 2015-01-13 (×5): qty 1

## 2015-01-13 MED ORDER — SODIUM CHLORIDE 0.9 % IJ SOLN
3.0000 mL | Freq: Two times a day (BID) | INTRAMUSCULAR | Status: DC
  Administered 2015-01-13 – 2015-01-16 (×8): 3 mL via INTRAVENOUS

## 2015-01-13 MED ORDER — GLUCAGON HCL RDNA (DIAGNOSTIC) 1 MG IJ SOLR: 1.0000 mg | Freq: Once | INTRAMUSCULAR | Status: DC | PRN

## 2015-01-13 MED ORDER — ACETAMINOPHEN 500 MG PO TABS: 500.0000 mg | ORAL_TABLET | Freq: Four times a day (QID) | ORAL | Status: DC | PRN

## 2015-01-13 MED ORDER — NITROGLYCERIN 2 % TD OINT
1.0000 [in_us] | TOPICAL_OINTMENT | Freq: Four times a day (QID) | TRANSDERMAL | Status: DC
  Administered 2015-01-13 – 2015-01-17 (×16): 1 [in_us] via TOPICAL
  Filled 2015-01-13: qty 30

## 2015-01-13 MED ORDER — ONDANSETRON HCL 4 MG/2ML IJ SOLN: 4.0000 mg | Freq: Four times a day (QID) | INTRAMUSCULAR | Status: DC | PRN

## 2015-01-13 MED ORDER — INSULIN ASPART 100 UNIT/ML ~~LOC~~ SOLN
0.0000 [IU] | Freq: Every day | SUBCUTANEOUS | Status: DC
  Administered 2015-01-14: 2 [IU] via SUBCUTANEOUS

## 2015-01-13 MED ORDER — PENTOXIFYLLINE ER 400 MG PO TBCR
400.0000 mg | EXTENDED_RELEASE_TABLET | Freq: Two times a day (BID) | ORAL | Status: DC
  Administered 2015-01-13 – 2015-01-17 (×9): 400 mg via ORAL
  Filled 2015-01-13 (×10): qty 1

## 2015-01-13 MED ORDER — ALUM & MAG HYDROXIDE-SIMETH 200-200-20 MG/5ML PO SUSP
30.0000 mL | Freq: Four times a day (QID) | ORAL | Status: DC | PRN
  Administered 2015-01-16: 30 mL via ORAL
  Filled 2015-01-13: qty 30

## 2015-01-13 MED ORDER — INSULIN DETEMIR 100 UNIT/ML ~~LOC~~ SOLN
20.0000 [IU] | Freq: Every day | SUBCUTANEOUS | Status: DC
  Administered 2015-01-14: 20 [IU] via SUBCUTANEOUS
  Filled 2015-01-13 (×2): qty 0.2

## 2015-01-13 MED ORDER — BISACODYL 10 MG RE SUPP: 10.0000 mg | Freq: Every day | RECTAL | Status: DC | PRN

## 2015-01-13 MED ORDER — CARVEDILOL 3.125 MG PO TABS
3.1250 mg | ORAL_TABLET | Freq: Two times a day (BID) | ORAL | Status: DC
  Administered 2015-01-13 – 2015-01-17 (×9): 3.125 mg via ORAL
  Filled 2015-01-13 (×9): qty 1

## 2015-01-13 NOTE — ED Notes (Signed)
Patient given sandwich and encouraged to eat

## 2015-01-13 NOTE — H&P (Addendum)
Patient Demographics:    Edgar Brewer, is a 54 y.o. male  MRN: [id number]   DOB - 10-Aug-1960  Admit Date - 01/12/2015  Outpatient Primary MD for the patient is Marlou Sa, ERIC, MD   With History of -  Past Medical History  Diagnosis Date  . Diabetes mellitus without complication ([place])   . Neuropathy ([place])   . Varicose veins of lower extremities with complications [id number]  . Acute on chronic combined systolic and diastolic CHF, NYHA class 1 ([place]) 01/13/2015      History reviewed. No pertinent past surgical history.  in for   Chief Complaint  Patient presents with  . Hypoglycemia      HPI:    Edgar Brewer  is a 54 y.o. 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 [name], diabetes mellitus type 2 on insulin, essential hypertension, chronic varicose veins, who comes to the hospital after being somewhat confused for the last 24-36 hours at home, apparently he ran out of his diuretics few weeks ago but never got a refill, he started to develop edema in his legs, he continued to take his diabetic medications along with insulin as usual, as he was getting more confused and was brought to the ER. In the ER he was found to be hypoglycemic, in acute renal failure, acute on chronic CHF and I was called to admit.  Patient currently according to the ER nurse is much more awake and alert however he still pleasantly confused, he is oriented 2, he denies any fever chills or headache, no new problems with vision or hearing, currently no chest pain, mild orthopnea, no cough, no palpitations, no abdominal pain, denies any focal weakness, agrees to developing more edema. Does not recall any change in his diabetic medications recently. No skipped meals.   Review of  systems:    In addition to the HPI above, negative review of systems but unreliable historian says no to all questions, currently has no subjective complaints No Fever-chills, No Headache, No changes with Vision or hearing, No problems swallowing food or Liquids, No Chest pain, Cough or Shortness of Breath, No Abdominal pain, No Nausea or Vommitting, Bowel movements are regular, No Blood in stool or Urine, No dysuria, No new skin rashes or bruises, No new joints pains-aches,  No new weakness, tingling, numbness in any extremity, No recent weight gain or loss, No polyuria, polydypsia or polyphagia, No significant Mental Stressors.  A full 10 point Review of Systems was done, except as stated above, all other Review of Systems were negative.    Social History:     Social History  Substance Use Topics  . Smoking status: Current Every Day Smoker -- 1.00 packs/day    Types: Cigarettes  . Smokeless tobacco: Never Used  . Alcohol Use: 0.6 oz/week    1 Glasses of wine per week  Comment: rare occassion    Lives - at home and fairly mobile      Family History :     Family History  Problem Relation Age of Onset  . Cancer Father        Home Medications:   Prior to Admission medications   Medication Sig Start Date End Date Taking? Authorizing Provider  acetaminophen (TYLENOL) 500 MG tablet Take 500 mg by mouth every 6 (six) hours as needed for mild pain.   Yes Historical Provider, MD  Cholecalciferol (VITAMIN D3) 2000 UNITS capsule Take 2,000 Units by mouth daily.   Yes Historical Provider, MD  gabapentin (NEURONTIN) 300 MG capsule Take 300 mg by mouth 3 (three) times daily.   Yes Historical Provider, MD  glipiZIDE (GLUCOTROL) 10 MG tablet Take 10 mg by mouth 2 (two) times daily before a meal.   Yes Historical Provider, MD  ibuprofen (ADVIL,MOTRIN) 200 MG tablet Take 400 mg by mouth every 6 (six) hours as needed for moderate pain (pain).   Yes Historical Provider, MD    insulin detemir (LEVEMIR) 100 UNIT/ML injection Inject 30 Units into the skin at bedtime.   Yes Historical Provider, MD  losartan (COZAAR) 100 MG tablet Take 1 tablet (100 mg total) by mouth daily. 06/25/14  Yes Josue Hector, MD  naproxen (NAPROSYN) 500 MG tablet Take 500 mg by mouth 2 (two) times daily with a meal.   Yes Historical Provider, MD  PENTOXIFYLLINE ER PO Take 400 mg by mouth 2 (two) times daily.   Yes Historical Provider, MD  vitamin C (ASCORBIC ACID) 500 MG tablet Take 500 mg by mouth at bedtime.   Yes Historical Provider, MD     Allergies:     Allergies  Allergen Reactions  . Codeine Hives and Swelling    Swelling all over body      Physical Exam:   Vitals  Blood pressure 144/79, pulse 74, temperature 97.5 F (36.4 C), temperature source Oral, resp. rate 20, weight 97.523 kg (215 lb), SpO2 93 %.   1. General middle-aged Caucasian male lying in bed in NAD,     2. Normal affect and insight, Not Suicidal or Homicidal, Awake , mildly confused oriented 2.  3. No F.N deficits, ALL C.Nerves Intact, Strength 5/5 all 4 extremities, Sensation intact all 4 extremities, Plantars down going.  4. Ears and Eyes appear Normal, Conjunctivae clear, PERRLA. Moist Oral Mucosa.  5. Supple Neck, + JVD, No cervical lymphadenopathy appriciated, No Carotid Bruits.  6. Symmetrical Chest wall movement, Good air movement bilaterally, bilateral rales  7. RRR, No Gallops, Rubs or Murmurs, No Parasternal Heave.  8. Positive Bowel Sounds, Abdomen Soft, No tenderness, No organomegaly appriciated,No rebound -guarding or rigidity.  9.  No Cyanosis, Normal Skin Turgor, No Skin Rash or Bruise. 1+ leg edema, L>R, Chr venous stasis dermatitis L leg, no warmth  10. Good muscle tone,  joints appear normal , no effusions, Normal ROM.  11. No Palpable Lymph Nodes in Neck or Axillae      Data Review:    CBC  Recent Labs Lab 01/12/15 2255  WBC 9.4  HGB 11.8*  HCT 35.1*  PLT 274  MCV  87.1  MCH 29.3  MCHC 33.6  RDW 13.7   ------------------------------------------------------------------------------------------------------------------  Chemistries   Recent Labs Lab 01/12/15 2255  NA 142  K 3.2*  CL 107  CO2 24  GLUCOSE 39*  BUN 29*  CREATININE 2.11*  CALCIUM 8.4*   ------------------------------------------------------------------------------------------------------------------ estimated creatinine clearance  is 44.8 mL/min (by C-G formula based on Cr of 2.11). ------------------------------------------------------------------------------------------------------------------ No results for input(s): TSH, T4TOTAL, T3FREE, THYROIDAB in the last 72 hours.  Invalid input(s): FREET3   Coagulation profile No results for input(s): INR, PROTIME in the last 168 hours. ------------------------------------------------------------------------------------------------------------------- No results for input(s): DDIMER in the last 72 hours. -------------------------------------------------------------------------------------------------------------------  Cardiac Enzymes No results for input(s): CKMB, TROPONINI, MYOGLOBIN in the last 168 hours.  Invalid input(s): CK ------------------------------------------------------------------------------------------------------------------ Invalid input(s): POCBNP   ---------------------------------------------------------------------------------------------------------------  Urinalysis    Component Value Date/Time   COLORURINE YELLOW 01/13/2015 0058   APPEARANCEUR CLEAR 01/13/2015 0058   LABSPEC 1.021 01/13/2015 0058   PHURINE 6.5 01/13/2015 0058   GLUCOSEU NEGATIVE 01/13/2015 0058   HGBUR LARGE* 01/13/2015 0058   BILIRUBINUR NEGATIVE 01/13/2015 0058   KETONESUR NEGATIVE 01/13/2015 0058   PROTEINUR >300* 01/13/2015 0058   NITRITE NEGATIVE 01/13/2015 0058   LEUKOCYTESUR NEGATIVE 01/13/2015 0058     ----------------------------------------------------------------------------------------------------------------   Imaging Results:    Dg Chest 2 View  01/13/2015  CLINICAL DATA:  Patient status post fall. Weakness. Shortness of breath. EXAM: CHEST  2 VIEW COMPARISON:  Chest radiograph 05/04/2014. FINDINGS: Stable cardiac and mediastinal contours. Pulmonary vascular redistribution. No definite interstitial pulmonary edema. No pleural effusion or pneumothorax. Mid thoracic spine degenerative changes. IMPRESSION: Grossly unchanged pulmonary vascular redistribution with interstitial prominence without definite pulmonary edema. Electronically Signed   By: Lovey Newcomer M.D.   On: 01/13/2015 08:20   Ct Renal Stone Study  01/13/2015  CLINICAL DATA:  Hematuria and epigastric pain. EXAM: CT ABDOMEN AND PELVIS WITHOUT CONTRAST TECHNIQUE: Multidetector CT imaging of the abdomen and pelvis was performed following the standard protocol without IV contrast. COMPARISON:  None. FINDINGS: Lower chest and abdominal wall:  Anasarca. Prominent inguinal lymph nodes, benign-appearing. Ovoid subcutaneous nodule anterior to the left gluteal musculature, 27 mm maximal diameter, likely dermal inclusion cyst. Small layering pleural effusions and trace pericardial effusion. Polygonal opacity over the right diaphragm is likely atelectasis. Hepatobiliary: No focal liver abnormality.Multiple calcified stones within the partially collapsed gallbladder. Pancreas: Unremarkable. Spleen: 15 mm low-density lesion subcapsular and anterior, usually incidental in isolation. Adrenals/Urinary Tract: Negative adrenals. Symmetric nonspecific perinephric edema. No hydronephrosis or stone. No evidence of mass lesion. No venous expansion or high-density. Unremarkable bladder. Reproductive:No pathologic findings. Stomach/Bowel: No obstruction. No appendicitis. Extensive for age colonic diverticulosis. Vascular/Lymphatic: No acute vascular  abnormality. Borderline retroperitoneal adenopathy without specific pattern. Peritoneal: No ascites or pneumoperitoneum. Musculoskeletal: No acute abnormalities. IMPRESSION: 1. No explanation for hematuria. 2. Volume overload. 3. Cholelithiasis.  Colonic diverticulosis. Electronically Signed   By: Monte Fantasia M.D.   On: 01/13/2015 03:41    My personal review of EKG: Rhythm NSR,  no Acute ST changes   Assessment & Plan:     1. ARF with incomplete bladder emptying- caused by CHF decompensation along with him being on ARB and NSAIDs  - patient ran out of his diuretics, commence diuresis, CT renal protocol does not show any stone, he does have mild hematuria, bladder scan in the ER revealed close to 900 mL of urine, he voided 500 mL, at this point we will continue Lasix and place him on Flomax, will monitor bladder scan, if he continues to have high Residual [address] Foley. He could have BPH which could explain his urinary retention along with hematuria. If the infection improves outpatient follow-up with nephrology and urology for hematuria.  Hold ARB, NSAIDs.    2. Bladder retention. Flomax, monitor postvoid residuals.   3. Acute  on chronic combined systolic and diastolic CHF EF [id number]. Under the care of Dr. [name], he ran out of his diuretics, may be placed on salt and fluid restriction, daily weight, intake output monitor, place on Coreg, Lasix and Nitropaste and monitor.   4. DM type II. Likely hypoglycemic due to acute renal failure and being on Glucotrol high-dose. We'll skip morning Glucotrol, will cut Glucotrol dose in half, will reduce Lantus and write holding parameters, for now every 4 hours sliding scale. Monitor CBGs and adjust. Check A1c.    5. Encephalopathy. Due to combination of hypoglycemia and mild ARF. No focal deficits no headache, check ammonia, supportive care and monitor.   6. Essential hypertension. Place on Coreg, hydralazine and Nitropaste. Discontinue ARB and  monitor.   7. 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 Will check ultrasound to rule out DVT clinically less likely, elevated both extremity's, diuresis, Unna boots.    DVT Prophylaxis Heparin   AM Labs Ordered, also please review Full Orders  Family Communication: Admission, patients condition and plan of care including tests being ordered have been discussed with the patient and mother who indicate understanding and agree with the plan and Code Status.  Code Status Full  Likely DC to  Home 2-3 days  Condition GUARDED     Time spent in minutes : 35    SINGH,PRASHANT K M.D on 01/13/2015 at 8:36 AM  Between 7am to 7pm - Pager - [phone]  After 7pm go to [email or web address] - password [facility]  [facility] - Office  [phone]

## 2015-01-13 NOTE — Progress Notes (Signed)
*  Preliminary Results* Bilateral lower extremity venous duplex completed. Study was technically limited due to edema and limited acoustic penetration. Visualized veins of bilateral lower extremities are negative for deep vein thrombosis. There is no evidence of Baker's cyst bilaterally.  01/13/2015  Maudry Mayhew, RVT, RDCS, RDMS

## 2015-01-13 NOTE — Care Management Note (Signed)
Case Management Note  Patient Details  Name: Edgar Brewer MRN: [id number] Date of Birth: 1960/09/28  Subjective/Objective:   54 y/o m admitted w/CHF, ARF. Hx: DM. From home.No insurance-financial counselor following. Will explore resources.                 Action/Plan:d/c plan home.   Expected Discharge Date:   (unknown)               Expected Discharge Plan:  Home/Self Care  In-House Referral:     Discharge planning Services  CM Consult  Post Acute Care Choice:    Choice offered to:     DME Arranged:    DME Agency:     HH Arranged:    HH Agency:     Status of Service:  In process, will continue to follow  Medicare Important Message Given:    Date Medicare IM Given:    Medicare IM give by:    Date Additional Medicare IM Given:    Additional Medicare Important Message give by:     If discussed at [place] of Stay Meetings, dates discussed:    Additional Comments:  Dessa Phi, RN 01/13/2015, 4:03 PM

## 2015-01-13 NOTE — ED Notes (Signed)
Pt can go at 08:28

## 2015-01-13 NOTE — ED Notes (Signed)
PPT CAN GO AT 08:28, Janett Billow

## 2015-01-13 NOTE — ED Notes (Signed)
Pt to CT

## 2015-01-13 NOTE — Consult Note (Signed)
WOC wound consult note Reason for Consult:Bilateral LEs for consideration of Unna's Boots following Doppler study to rule out thrombosis Wound type:Venous insufficiency Pressure Ulcer POA: No Measurement:No open wounds.  Bilateral erythema dn mild edema (patient states it is improved since being in bed with legs elevated) Wound [id number] Drainage (amount, consistency, odor) None at this time.  Patient reports that right LE has had weeping sores in the past. Periwound:Erythematous, edematous.  Dry peeling skin in areas. Dressing procedure/placement/frequency: Legs will be moisturized after cleansing.  Bilateral toe to knee Unna Boots placed by ortho tech.  Patient will require follow up by [facility] or by outpatient wound care center for weekly changes until edema resolved and until he can be fitted for compression hosiery.  Patient has thigh-hi hose that he cannot wear.  He is amenable to wearing knee-hi hosiery. [place] nursing team will not follow, but will remain available to this patient, the nursing and medical teams.  Please re-consult if needed. Thanks, Maudie Flakes, MSN, RN, [place], Arther Abbott  Pager# [phone]

## 2015-01-13 NOTE — ED Notes (Signed)
Spoke to Dr. Candiss Norse regarding bladder scan results post void.  Advised ordering Flomax and post void bladder scan at 11am.

## 2015-01-14 DIAGNOSIS — E1121 Type 2 diabetes mellitus with diabetic nephropathy: Secondary | ICD-10-CM

## 2015-01-14 DIAGNOSIS — N179 Acute kidney failure, unspecified: Secondary | ICD-10-CM

## 2015-01-14 DIAGNOSIS — I83893 Varicose veins of bilateral lower extremities with other complications: Secondary | ICD-10-CM

## 2015-01-14 DIAGNOSIS — E669 Obesity, unspecified: Secondary | ICD-10-CM | POA: Diagnosis present

## 2015-01-14 DIAGNOSIS — I10 Essential (primary) hypertension: Secondary | ICD-10-CM | POA: Diagnosis present

## 2015-01-14 DIAGNOSIS — I872 Venous insufficiency (chronic) (peripheral): Secondary | ICD-10-CM

## 2015-01-14 DIAGNOSIS — N184 Chronic kidney disease, stage 4 (severe): Secondary | ICD-10-CM | POA: Diagnosis present

## 2015-01-14 DIAGNOSIS — E118 Type 2 diabetes mellitus with unspecified complications: Secondary | ICD-10-CM | POA: Diagnosis present

## 2015-01-14 DIAGNOSIS — N189 Chronic kidney disease, unspecified: Secondary | ICD-10-CM

## 2015-01-14 DIAGNOSIS — E1159 Type 2 diabetes mellitus with other circulatory complications: Secondary | ICD-10-CM | POA: Diagnosis present

## 2015-01-14 DIAGNOSIS — E119 Type 2 diabetes mellitus without complications: Secondary | ICD-10-CM | POA: Diagnosis present

## 2015-01-14 DIAGNOSIS — E876 Hypokalemia: Secondary | ICD-10-CM | POA: Diagnosis present

## 2015-01-14 DIAGNOSIS — R319 Hematuria, unspecified: Secondary | ICD-10-CM

## 2015-01-14 DIAGNOSIS — N183 Chronic kidney disease, stage 3 (moderate): Secondary | ICD-10-CM

## 2015-01-14 DIAGNOSIS — D631 Anemia in chronic kidney disease: Secondary | ICD-10-CM | POA: Diagnosis present

## 2015-01-14 LAB — GLUCOSE, CAPILLARY
GLUCOSE-CAPILLARY: 136 mg/dL — AB (ref 65–99)
Glucose-Capillary: 180 mg/dL — ABNORMAL HIGH (ref 65–99)
Glucose-Capillary: 147 mg/dL — ABNORMAL HIGH (ref 65–99)
Glucose-Capillary: 205 mg/dL — ABNORMAL HIGH (ref 65–99)

## 2015-01-14 LAB — CBC
HCT: 29.9 % — ABNORMAL LOW (ref 39.0–52.0)
Hemoglobin: 9.8 g/dL — ABNORMAL LOW (ref 13.0–17.0)
MCH: 29.1 pg (ref 26.0–34.0)
MCHC: 32.8 g/dL (ref 30.0–36.0)
MCV: 88.7 fL (ref 78.0–100.0)
PLATELETS: 218 10*3/uL (ref 150–400)
RBC: 3.37 MIL/uL — ABNORMAL LOW (ref 4.22–5.81)
RDW: 14 % (ref 11.5–15.5)
WBC: 7.2 10*3/uL (ref 4.0–10.5)

## 2015-01-14 LAB — BASIC METABOLIC PANEL
Anion gap: 8 (ref 5–15)
BUN: 37 mg/dL — AB (ref 6–20)
CALCIUM: 8.1 mg/dL — AB (ref 8.9–10.3)
CO2: 25 mmol/L (ref 22–32)
CREATININE: 2.64 mg/dL — AB (ref 0.61–1.24)
Chloride: 109 mmol/L (ref 101–111)
GFR calc non Af Amer: 26 mL/min — ABNORMAL LOW (ref >60)
GFR, EST AFRICAN AMERICAN: 30 mL/min — AB (ref >60)
GLUCOSE: 138 mg/dL — AB (ref 65–99)
Potassium: 4 mmol/L (ref 3.5–5.1)
Sodium: 142 mmol/L (ref 135–145)

## 2015-01-14 LAB — HEMOGLOBIN A1C
Hgb A1c MFr Bld: 5.7 % — ABNORMAL HIGH (ref 4.8–5.6)
MEAN PLASMA GLUCOSE: 117 mg/dL

## 2015-01-14 NOTE — Progress Notes (Signed)
Patient ID: Edgar Brewer, male   DOB: [birth year], 54 y.o.   MRN: [id number]  [facility] PROGRESS NOTE  Edgar Brewer [id number] DOB: 1960/02/26 DOA: 01/12/2015 PCP: [name], MD   Brief narrative:    54 y.o. 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 [name], diabetes mellitus type 2 on insulin, essential hypertension, chronic varicose veins, who presented with main concern of progressively worsening upper and lower extremity edema, dyspnea with exertion and at rest. Pt reported he ran out of his medications few weeks ago including diuretics and insulin.  Assessment/Plan:    Principal Problem:   Acute on chronic combined systolic and diastolic CHF, NYHA class 1 (H[facility]) - last 2 D ECHO in May 2016 with EF 45% - pt currently on lasix 40 mg IV BID  - weight trend since admission: 215 --> 212 lbs - will continue same regimen, strict I/O, daily weights monitoring   Active Problems:   Acute renal failure superimposed on stage 3 chronic kidney disease ([place]) - review of record indicate GFR in 40 - 50 's with Cr as high as 1.4 in April 2016 - with underlying risk factors of HTN, HLD, DM, GFR, pt meets criteria for CKD stage III and at high risk of progressive renal disease - Cr trending up from 2.11 --> 2.64, suspect this to be related to Lasix - since pt still with significant edema in upper and lower extremities, will continue same dose Lasix - if Cr continues to trend up, will d/w nephrology team    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    Accelerated hypertension - SBP still > 140 - continue with coreg, lasix, hydralazine for now    Hematuria - resolved     Hypokalemia - supplemented and WNL this AM - BMP in AM    Anemia of chronic kidney failure - drop in Hg since admission - no signs of bleeding, may need to hold SQ Heparin  - CBC in AM  DVT prophylaxis - Heparin SQ   Code Status: Full.  Family Communication:  plan of care discussed with the patient Disposition Plan: Home by 12/23  IV access:  Peripheral IV  Procedures and diagnostic studies:    Dg Chest 2 View 01/13/2015  Grossly unchanged pulmonary vascular redistribution with interstitial prominence without definite pulmonary edema.  Ct Renal Stone Study 01/13/2015  No explanation for hematuria. 2. Volume overload. 3. Cholelithiasis.  Colonic diverticulosis.   Medical Consultants:  Cardiology   Other Consultants:  PT  IAnti-Infectives:   None   [name], MD  [facility] Pager [phone]  If 7PM-7AM, please contact night-coverage [email or web address] Password [facility] 01/14/2015, 2:55 PM   LOS: 1 day   HPI/Subjective: No events overnight.   Objective: Filed Vitals:   01/14/15 0519 01/14/15 0815 01/14/15 1326 01/14/15 1434  BP: 135/61  150/64 141/64 168/90  Pulse: 78 80 80   Temp: 98.5 F (36.9 C)  98.3 F (36.8 C)   TempSrc: Oral  Oral   Resp: 16  18   Height:      Weight: 96.299 kg (212 lb 4.8 oz)     SpO2: 100%  98%     Intake/Output Summary (Last 24 hours) at 01/14/15 1455 Last data filed at 01/14/15 1326  Gross per 24 hour  Intake    448 ml  Output    445 ml  Net      3 ml    Exam:   General:  Pt is alert, follows commands appropriately, not in acute distress  Cardiovascular: Regular rate and rhythm, S1/S2, no murmurs, no rubs, no  gallops  Respiratory: Clear to auscultation bilaterally, no wheezing, mild crackles at bases   Abdomen: Soft, non tender, non distended, bowel sounds present, no guarding  Extremities: bilateral upper and lower extremity edema   Data Reviewed: Basic Metabolic Panel:  Recent Labs Lab 01/12/15 2255 01/13/15 0932 01/14/15 0434  NA 142  --  142  K 3.2*  --  4.0  CL 107  --  109  CO2 24  --  25  GLUCOSE 39*  --  138*  BUN 29*  --  37*  CREATININE 2.11* 2.15* 2.64*  CALCIUM 8.4*  --  8.1*    Recent Labs Lab 01/13/15 0820  AMMONIA 42*   CBC:  Recent Labs Lab 01/12/15 2255 01/13/15 0932 01/14/15 0434  WBC 9.4 7.3 7.2  HGB 11.8* 10.8* 9.8*  HCT 35.1* 32.3* 29.9*  MCV 87.1 88.0 88.7  PLT 274 241 218   CBG:  Recent Labs Lab 01/13/15 1136 01/13/15 1627 01/13/15 2135 01/14/15 0744 01/14/15 1150  GLUCAP 166* 186* 110* 136* 180*   Scheduled Meds: . carvedilol  3.125 mg Oral BID WC  . furosemide  40 mg Intravenous BID  . glipiZIDE  5 mg Oral BID AC  . heparin  5,000 Units Subcutaneous 3 times per day  . hydrALAZINE  50 mg Oral 3 times per day  . insulin aspart  0-5 Units Subcutaneous QHS  . insulin aspart  0-9 Units Subcutaneous TID WC  . insulin detemir  20 Units Subcutaneous QHS  . nitroGLYCERIN  1 inch Topical 4 times per day  . pentoxifylline  400 mg Oral BID WC  . tamsulosin  0.4 mg Oral Daily   Continuous Infusions:

## 2015-01-14 NOTE — Care Management Note (Signed)
Case Management Note  Patient Details  Name: Devontay Gerstenberger MRN: [id number] Date of Birth: [birth year]  Subjective/Objective: [facility] rep Santiago Glad will be able to follow under their charity program for [facility] care & disease mgmnt.                   Action/Plan:d/c plan home w/HHC.   Expected Discharge Date:   (unknown)               Expected Discharge Plan:  Home/Self Care  In-House Referral:     Discharge planning Services  CM Consult  Post Acute Care Choice:    Choice offered to:     DME Arranged:    DME Agency:     HH Arranged:    HH Agency:     Status of Service:  In process, will continue to follow  Medicare Important Message Given:    Date Medicare IM Given:    Medicare IM give by:    Date Additional Medicare IM Given:    Additional Medicare Important Message give by:     If discussed at [place] of Stay Meetings, dates discussed:    Additional Comments:  Dessa Phi, RN 01/14/2015, 2:47 PM

## 2015-01-14 NOTE — Care Management Note (Signed)
Case Management Note  Patient Details  Name: Edgar Brewer MRN: [id number] Date of Birth: 11-18-1960  Subjective/Objective:  Explored resources w/patient.  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  Works part time @ [employer].  Provided w/[employer] info-community resources also included.[facility] $4 med list given.He does not qualify for [facility] program since he works.  He says if meds are reasonable he can afford.  He does not qualify for Advanced Heart failure program-does not have EF of 30% or less.Noted WOC note-HHRN or wound care clinic.Recc while in hospital to teach on wound care for leg.Patient would have to pay for any otpt services and [facility] An visit can be as high as $150.  [facility] would do a patient asst program if [facility] was needed.  Will have [facility] to just follow if needed.                 Action/Plan:d/c plan home.   Expected Discharge Date:   (unknown)               Expected Discharge Plan:  Home/Self Care  In-House Referral:     Discharge planning Services  CM Consult  Post Acute Care Choice:    Choice offered to:     DME Arranged:    DME Agency:     HH Arranged:    HH Agency:     Status of Service:  In process, will continue to follow  Medicare Important Message Given:    Date Medicare IM Given:    Medicare IM give by:    Date Additional Medicare IM Given:    Additional Medicare Important Message give by:     If discussed at [place] of Stay Meetings, dates discussed:    Additional Comments:  Dessa Phi, RN 01/14/2015, 12:23 PM

## 2015-01-14 NOTE — Progress Notes (Signed)
PT Cancellation Note  Patient Details Name: Edgar Brewer MRN: [id number] DOB: [birth year]   Cancelled Treatment:    Reason Eval/Treat Not Completed: Other (comment)- mother just ambulated with patient. Will check back tomorrow.   [name] 01/14/2015, 3:43 PM Tresa Endo PT [phone]

## 2015-01-15 LAB — BASIC METABOLIC PANEL
ANION GAP: 6 (ref 5–15)
BUN: 43 mg/dL — ABNORMAL HIGH (ref 6–20)
CALCIUM: 8.1 mg/dL — AB (ref 8.9–10.3)
CO2: 27 mmol/L (ref 22–32)
Chloride: 109 mmol/L (ref 101–111)
Creatinine, Ser: 2.65 mg/dL — ABNORMAL HIGH (ref 0.61–1.24)
GFR, EST AFRICAN AMERICAN: 30 mL/min — AB (ref >60)
GFR, EST NON AFRICAN AMERICAN: 26 mL/min — AB (ref >60)
GLUCOSE: 67 mg/dL (ref 65–99)
POTASSIUM: 3.9 mmol/L (ref 3.5–5.1)
Sodium: 142 mmol/L (ref 135–145)

## 2015-01-15 LAB — GLUCOSE, CAPILLARY
GLUCOSE-CAPILLARY: 124 mg/dL — AB (ref 65–99)
GLUCOSE-CAPILLARY: 40 mg/dL — AB (ref 65–99)
GLUCOSE-CAPILLARY: 94 mg/dL (ref 65–99)
Glucose-Capillary: 49 mg/dL — ABNORMAL LOW (ref 65–99)
Glucose-Capillary: 133 mg/dL — ABNORMAL HIGH (ref 65–99)
Glucose-Capillary: 162 mg/dL — ABNORMAL HIGH (ref 65–99)

## 2015-01-15 LAB — CBC
HEMATOCRIT: 30.9 % — AB (ref 39.0–52.0)
HEMOGLOBIN: 10.1 g/dL — AB (ref 13.0–17.0)
MCH: 29.2 pg (ref 26.0–34.0)
MCHC: 32.7 g/dL (ref 30.0–36.0)
MCV: 89.3 fL (ref 78.0–100.0)
Platelets: 227 10*3/uL (ref 150–400)
RBC: 3.46 MIL/uL — AB (ref 4.22–5.81)
RDW: 13.9 % (ref 11.5–15.5)
WBC: 7.2 10*3/uL (ref 4.0–10.5)

## 2015-01-15 LAB — TROPONIN I

## 2015-01-15 MED ORDER — HYDROMORPHONE HCL 1 MG/ML IJ SOLN
1.0000 mg | INTRAMUSCULAR | Status: DC | PRN
  Administered 2015-01-15: 1 mg via INTRAVENOUS
  Filled 2015-01-15: qty 1

## 2015-01-15 MED ORDER — DEXTROSE 50 % IV SOLN
INTRAVENOUS | Status: AC
  Administered 2015-01-15: 50 mL
  Filled 2015-01-15: qty 50

## 2015-01-15 MED ORDER — INSULIN DETEMIR 100 UNIT/ML ~~LOC~~ SOLN
10.0000 [IU] | Freq: Every day | SUBCUTANEOUS | Status: DC
  Administered 2015-01-15 – 2015-01-16 (×2): 10 [IU] via SUBCUTANEOUS
  Filled 2015-01-15 (×4): qty 0.1

## 2015-01-15 NOTE — Progress Notes (Addendum)
CBG 40 this am. Provider at bedside, orange juice given without resolve. CBG 49, D50 administered. Patient has also eaten breakfast, cbg 124. Will continue to monitor.

## 2015-01-15 NOTE — Progress Notes (Signed)
Inpatient Diabetes Program Recommendations  AACE/ADA: New Consensus Statement on Inpatient Glycemic Control (2015)  Target Ranges:  Prepandial:   less than 140 mg/dL      Peak postprandial:   less than 180 mg/dL (1-2 hours)      Critically ill patients:  140 - 180 mg/dL   Results for Edgar Brewer, Edgar Brewer (MRN [id number]) as of 01/15/2015 09:37  Ref. Range 01/15/2015 07:33 01/15/2015 07:48 01/15/2015 08:10  Glucose-Capillary Latest Ref Range: 65-99 mg/dL 40 (LL) 49 (L) 124 (H)    Home DM Meds: Glipizide 10 mg bid       Levemir 30 units QHS  Current Insulin Orders: Levemir 20 units QHS      Novolog Sensitive SSI (0-9 units) TID AC + HS      MD- Patient with Severe Hypoglycemia this AM after receiving 20 units Levemir the night before.  Please consider reducing Levemir to 15 units QHS    --Will follow patient during hospitalization--  Wyn Quaker RN, MSN, CDE Diabetes Coordinator Inpatient Glycemic Control Team Team Pager: [phone] (8a-5p)

## 2015-01-15 NOTE — Progress Notes (Signed)
Patient ambulated in hallway with 1 assist. Pt tolerated very well, gait is not consistent sometimes unsteady.

## 2015-01-15 NOTE — Evaluation (Signed)
Physical Therapy Evaluation Patient Details Name: Edgar Brewer MRN: [id number] DOB: December 04, 1960 Today's Date: 01/15/2015   History of Present Illness  54 yo male admitted with CHF. hx of CHF, DM, HTN, LE edema, varicose veins.   Clinical Impression  On eval, pt walked ~350 feet with Min guard assist. Intermittent unsteadiness. Do not anticipate any follow up PT needs at discharge.     Follow Up Recommendations No PT follow up;Supervision - Intermittent    Equipment Recommendations  None recommended by PT    Recommendations for Other Services       Precautions / Restrictions Precautions Precautions: Fall Restrictions Weight Bearing Restrictions: No      Mobility  Bed Mobility Overal bed mobility: Modified Independent                Transfers Overall transfer level: Modified independent                  Ambulation/Gait Ambulation/Gait assistance: Min guard Ambulation Distance (Feet): 350 Feet Assistive device: None Gait Pattern/deviations: Step-through pattern;Decreased stride length        Stairs            Wheelchair Mobility    Modified Rankin (Stroke Patients Only)       Balance Overall balance assessment: Needs assistance         Standing balance support: During functional activity Standing balance-Leahy Scale: Good                               Pertinent Vitals/Pain Pain Assessment: No/denies pain    Home Living Family/patient expects to be discharged to:: Private residence Living Arrangements: Non-relatives/Friends (live with sister who works)   Type of Home: House Home Access: Stairs to enter Entrance Stairs-Rails: None   Home Layout: One level Home Equipment: Radio producer - single point      Prior Function           Comments: uses cane when needed     Hand Dominance        Extremity/Trunk Assessment   Upper Extremity Assessment: Overall WFL for tasks assessed           Lower Extremity  Assessment: Overall WFL for tasks assessed      Cervical / Trunk Assessment: Normal  Communication   Communication: No difficulties  Cognition Arousal/Alertness: Awake/alert Behavior During Therapy: WFL for tasks assessed/performed Overall Cognitive Status: Within Functional Limits for tasks assessed                      General Comments      Exercises        Assessment/Plan    PT Assessment Patient needs continued PT services  PT Diagnosis Difficulty walking   PT Problem List Decreased mobility  PT Treatment Interventions Gait training;Therapeutic activities;Functional mobility training;Patient/family education;Therapeutic exercise   PT Goals (Current goals can be found in the Care Plan section) Acute Rehab PT Goals Patient Stated Goal: home soon PT Goal Formulation: With patient Time For Goal Achievement: 01/29/15 Potential to Achieve Goals: Good    Frequency Min 2X/week   Barriers to discharge        Co-evaluation               End of Session   Activity Tolerance: Patient tolerated treatment well Patient left: with call bell/phone within reach;with bed alarm set           Time:  [id number] PT Time Calculation (min) (ACUTE ONLY): 21 min   Charges:   PT Evaluation $Initial PT Evaluation Tier I: 1 Procedure     PT G Codes:        Weston Anna, MPT Pager: [phone]

## 2015-01-15 NOTE — Progress Notes (Signed)
Patient ID: Edgar Brewer, male   DOB: [birth year], 54 y.o.   MRN: [id number]  [facility] PROGRESS NOTE  Edgar Brewer DOB: 1960-12-16 DOA: 01/12/2015 PCP: Edgar Ny, MD   Brief narrative:    54 y.o. 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 Pt reported he ran out of his medications few weeks ago including diuretics and insulin.  Assessment/Plan:    Principal Problem:   Acute on chronic combined systolic and diastolic CHF, NYHA class 1 (HCC) - last 2 D ECHO in May 2016 with EF 45% - pt currently on lasix 40 mg IV BID  - weight trend since admission: 215 --> 212 lbs --> 212 lbs  - will continue same regimen, strict I/O, daily weights monitoring as pt is still   Active Problems:   Acute renal failure superimposed on stage 3 chronic kidney disease ([place]) - review of record indicate GFR in 45 - 50 's with Cr as high as 1.4 in April 2016 - with underlying risk factors of HTN, HLD, DM, GFR, pt meets criteria for CKD stage III and at high risk of progressive renal disease - Cr trending up from 2.11 --> 2.64, suspect this to be related to Lasix and possibly new baseline  - since pt still with significant edema in upper and lower extremities, will continue same dose Lasix - BMP in AM    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    Accelerated hypertension - SBP still > 140 - continue with coreg, lasix, hydralazine for now    Hematuria - resolved     Hypokalemia - supplemented and remains WNL this AM - BMP in AM    Anemia of chronic kidney failure - drop in Hg since admission - no signs of bleeding, may need to hold SQ Heparin  - CBC in AM  DVT prophylaxis - Heparin SQ   Code Status: Full.  Family Communication:  plan of care discussed with the patient Disposition Plan: Home by 12/25  IV access:  Peripheral IV  Procedures and diagnostic studies:    Dg Chest 2 View 01/13/2015  Grossly unchanged pulmonary vascular redistribution with interstitial prominence without definite pulmonary edema.  Ct Renal Stone Study 01/13/2015  No explanation for hematuria. 2. Volume overload. 3. Cholelithiasis.  Colonic diverticulosis.   Medical Consultants:  Cardiology   Other Consultants:  PT  IAnti-Infectives:   None   Edgar Ramsay, MD  [facility] Pager [phone]  If 7PM-7AM, please contact night-coverage [email or web address] Password [facility] 01/15/2015, 2:40 PM   LOS: 2 days   HPI/Subjective: No events overnight.   Objective: Filed Vitals:   01/15/15 0530  01/15/15 0840 01/15/15 1036 01/15/15 1343  BP:  158/70 144/67 154/67  Pulse:  85 79 84  Temp:    98.3 F (36.8 C)  TempSrc:    Oral  Resp:   16 18  Height:      Weight: 96.344 kg (212 lb 6.4 oz)     SpO2:   98% 96%    Intake/Output Summary (Last 24 hours) at 01/15/15 1440 Last data filed at 01/15/15 0820  Gross per 24 hour  Intake    840 ml  Output    575 ml  Net    265 ml    Exam:   General:  Pt is alert, follows commands appropriately, not in acute distress  Cardiovascular: Regular rate and rhythm, S1/S2, no murmurs, no rubs, no  gallops  Respiratory: Clear to auscultation bilaterally, no wheezing, mild crackles at bases   Abdomen: Soft, non tender, non distended, bowel sounds present, no guarding  Extremities: bilateral upper and lower extremity edema   Data Reviewed: Basic Metabolic Panel:  Recent Labs Lab 01/12/15 2255 01/13/15 0932 01/14/15 0434 01/15/15 0430  NA 142  --  142 142  K 3.2*  --  4.0 3.9  CL 107  --  109 109  CO2 24  --  25 27  GLUCOSE 39*  --  138* 67  BUN 29*  --  37* 43*  CREATININE 2.11* 2.15* 2.64* 2.65*  CALCIUM 8.4*  --  8.1* 8.1*    Recent Labs Lab 01/13/15 0820  AMMONIA 42*   CBC:  Recent Labs Lab 01/12/15 2255 01/13/15 0932 01/14/15 0434 01/15/15 0430  WBC 9.4 7.3 7.2 7.2  HGB 11.8* 10.8* 9.8* 10.1*  HCT 35.1* 32.3* 29.9* 30.9*  MCV 87.1 88.0 88.7 89.3  PLT 274 241 218 227   CBG:  Recent Labs Lab 01/14/15 2159 01/15/15 0733 01/15/15 0748 01/15/15 0810 01/15/15 1231  GLUCAP 205* 40* 49* 124* 94   Scheduled Meds: . carvedilol  3.125 mg Oral BID WC  . furosemide  40 mg Intravenous BID  . glipiZIDE  5 mg Oral BID AC  . heparin  5,000 Units Subcutaneous 3 times per day  . hydrALAZINE  50 mg Oral 3 times per day  . insulin aspart  0-5 Units Subcutaneous QHS  . insulin aspart  0-9 Units Subcutaneous TID WC  . insulin detemir  20 Units Subcutaneous QHS  . nitroGLYCERIN  1 inch Topical 4 times per day  . pentoxifylline  400 mg Oral BID WC  . tamsulosin  0.4 mg Oral Daily

## 2015-01-15 NOTE — Progress Notes (Signed)
Patient c/o heaviness in mid chest, VS stable 158/64, 88, manual, 96 %ra. Pt reports having this pain all day but did not share with staff. Provider paged with update, awaiting call back.

## 2015-01-16 LAB — CBC
HCT: 32.3 % — ABNORMAL LOW (ref 39.0–52.0)
Hemoglobin: 10.5 g/dL — ABNORMAL LOW (ref 13.0–17.0)
MCH: 29.2 pg (ref 26.0–34.0)
MCHC: 32.5 g/dL (ref 30.0–36.0)
MCV: 90 fL (ref 78.0–100.0)
Platelets: 238 10*3/uL (ref 150–400)
RBC: 3.59 MIL/uL — ABNORMAL LOW (ref 4.22–5.81)
RDW: 14 % (ref 11.5–15.5)
WBC: 7.4 10*3/uL (ref 4.0–10.5)

## 2015-01-16 LAB — TROPONIN I: Troponin I: <0.03 ng/mL (ref <0.031)

## 2015-01-16 LAB — GLUCOSE, CAPILLARY
GLUCOSE-CAPILLARY: 157 mg/dL — AB (ref 65–99)
GLUCOSE-CAPILLARY: 184 mg/dL — AB (ref 65–99)
Glucose-Capillary: 162 mg/dL — ABNORMAL HIGH (ref 65–99)
Glucose-Capillary: 161 mg/dL — ABNORMAL HIGH (ref 65–99)

## 2015-01-16 LAB — BASIC METABOLIC PANEL
ANION GAP: 7 (ref 5–15)
BUN: 43 mg/dL — ABNORMAL HIGH (ref 6–20)
CHLORIDE: 107 mmol/L (ref 101–111)
CO2: 28 mmol/L (ref 22–32)
Calcium: 8.2 mg/dL — ABNORMAL LOW (ref 8.9–10.3)
Creatinine, Ser: 2.66 mg/dL — ABNORMAL HIGH (ref 0.61–1.24)
GFR calc Af Amer: 29 mL/min — ABNORMAL LOW (ref >60)
GFR, EST NON AFRICAN AMERICAN: 25 mL/min — AB (ref >60)
GLUCOSE: 168 mg/dL — AB (ref 65–99)
POTASSIUM: 4.2 mmol/L (ref 3.5–5.1)
Sodium: 142 mmol/L (ref 135–145)

## 2015-01-16 MED ORDER — FUROSEMIDE 40 MG PO TABS
40.0000 mg | ORAL_TABLET | Freq: Two times a day (BID) | ORAL | Status: DC
  Administered 2015-01-16 – 2015-01-17 (×2): 40 mg via ORAL
  Filled 2015-01-16 (×2): qty 1

## 2015-01-16 NOTE — Care Management Note (Signed)
Case Management Note  Patient Details  Name: Edgar Brewer MRN: [id number] Date of Birth: 1960-12-01  Subjective/Objective:[facility] chosen for [facility], social worker(charity care)-[employer] rep aware of [place] orders.                     Action/Plan:d/c plan home w/[facility]HC.   Expected Discharge Date:   (unknown)               Expected Discharge Plan:  [place]  In-House Referral:     Discharge planning Services  CM Consult  Post Acute Care Choice:    Choice offered to:  Patient  DME Arranged:    DME Agency:     HH Arranged:  RN, Social Work [employer] Agency:  [place]  Status of Service:  In process, will continue to follow  Medicare Important Message Given:    Date Medicare IM Given:    Medicare IM give by:    Date Additional Medicare IM Given:    Additional Medicare Important Message give by:     If discussed at [place] of Stay Meetings, dates discussed:    Additional Comments:  Dessa Phi, RN 01/16/2015, 10:15 AM

## 2015-01-16 NOTE — Progress Notes (Signed)
Patient ID: Edgar Brewer, male   DOB: 1960/03/16, 54 y.o.   MRN: [id number]  [facility] PROGRESS NOTE  Edgar Brewer [id number] DOB: 07/25/1960 DOA: 01/12/2015 PCP: [name], MD   Brief narrative:    54 y.o. 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 [name], diabetes mellitus type 2 on insulin, essential hypertension, chronic varicose veins, who presented with main concern of progressively worsening upper and lower extremity edema, dyspnea with exertion and at rest. Pt reported he ran out of his medications few weeks ago including diuretics and insulin.  Assessment/Plan:    Principal Problem:   Acute on chronic combined systolic and diastolic CHF, NYHA class 1 (HCC) - last 2 D ECHO in May 2016 with EF 45% - pt currently on lasix 40 mg IV BID  - weight trend since admission: 215 --> 212 lbs --> 210 lbs  - will continue same regimen but will change Lasix to PO - continue to monitor daily weights, I/O  Active Problems:   Acute renal failure superimposed on stage 3 chronic kidney disease ([place]) - review of record indicate GFR in 40 - 50 's with Cr as high as 1.4 in April 2016 - with underlying risk factors of HTN, HLD, DM, GFR, pt meets criteria for CKD stage III and at high risk of progressive renal disease - Cr trending up from 2.11 --> 2.66, suspect this to be related to Lasix and possibly new baseline  - continue Lasix as noted above  - BMP in AM    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    Accelerated hypertension - continue with coreg, lasix, hydralazine for now    Hematuria - resolved     Hypokalemia - supplemented and remains WNL this AM - BMP in AM    Anemia of chronic kidney failure - drop in Hg since admission - no signs of bleeding - CBC in AM  DVT prophylaxis - Heparin SQ   Code Status: Full.  Family Communication:  plan of care discussed with the patient Disposition Plan: Home by 12/25  IV access:  Peripheral IV  Procedures and diagnostic studies:    Dg Chest 2 View 01/13/2015  Grossly unchanged pulmonary vascular redistribution with interstitial prominence without definite pulmonary edema.  Ct Renal Stone Study 01/13/2015  No explanation for hematuria. 2. Volume overload. 3. Cholelithiasis.  Colonic diverticulosis.   Medical Consultants:  Cardiology   Other Consultants:  PT  IAnti-Infectives:   None   [name], MD  [facility] Pager [phone]  If 7PM-7AM, please contact night-coverage [email or web address] Password [email or web address] 01/16/2015, 1:13 PM   LOS: 3 days   HPI/Subjective: No events overnight.   Objective: Filed Vitals:   01/15/15 1700 01/15/15 2156 01/15/15 2320 01/16/15 0547  BP: 154/63 146/65 135/71 138/61  Pulse: 89 76 81 80  Temp:  97.8 F (36.6 C)  98.2 F (36.8 C)  TempSrc:  Oral  Oral  Resp:  18  18  Height:      Weight:    95.5 kg (210 lb 8.6 oz)  SpO2:  97%  95%    Intake/Output Summary (Last 24 hours) at 01/16/15 1313 Last data filed at 01/16/15 1000  Gross per 24 hour  Intake      0 ml  Output   1650 ml  Net  -1650 ml    Exam:   General:  Pt is alert, follows commands appropriately, not in acute distress  Cardiovascular: Regular rate and rhythm, S1/S2, no murmurs, no rubs, no gallops  Respiratory: Clear to auscultation bilaterally, no wheezing, mild crackles at bases   Abdomen: Soft, non  tender, non distended, bowel sounds present, no guarding  Extremities: bilateral upper and lower extremity edema improving   Data Reviewed: Basic Metabolic Panel:  Recent Labs Lab 01/12/15 2255 01/13/15 0932 01/14/15 0434 01/15/15 0430 01/16/15 0015  NA 142  --  142 142 142  K 3.2*  --  4.0 3.9 4.2  CL 107  --  109 109 107  CO2 24  --  25 27 28   GLUCOSE 39*  --  138* 67 168*  BUN 29*  --  37* 43* 43*  CREATININE 2.11* 2.15* 2.64* 2.65* 2.66*  CALCIUM 8.4*  --  8.1* 8.1* 8.2*    Recent Labs Lab 01/13/15 0820  AMMONIA 42*   CBC:  Recent Labs Lab 01/12/15 2255 01/13/15 0932 01/14/15 0434 01/15/15 0430 01/16/15 0015  WBC 9.4 7.3 7.2 7.2 7.4  HGB 11.8* 10.8* 9.8* 10.1* 10.5*  HCT 35.1* 32.3* 29.9* 30.9* 32.3*  MCV 87.1 88.0 88.7 89.3 90.0  PLT 274 241 218 227 238   CBG:  Recent Labs Lab 01/15/15 1231 01/15/15 1715 01/15/15 2154 01/16/15 0743 01/16/15 1150  GLUCAP 94 133* 162* 162* 157*   Scheduled Meds: . carvedilol  3.125 mg Oral BID WC  . furosemide  40 mg Intravenous BID  . glipiZIDE  5 mg Oral BID AC  . heparin  5,000 Units Subcutaneous 3 times per day  . hydrALAZINE  50 mg Oral 3 times per day  . insulin aspart  0-5 Units Subcutaneous QHS  . insulin aspart  0-9 Units Subcutaneous TID WC  . insulin detemir  20 Units Subcutaneous QHS  . nitroGLYCERIN  1 inch Topical 4 times per day  . pentoxifylline  400 mg Oral BID WC  . tamsulosin  0.4 mg Oral Daily

## 2015-01-17 DIAGNOSIS — I5043 Acute on chronic combined systolic (congestive) and diastolic (congestive) heart failure: Secondary | ICD-10-CM | POA: Insufficient documentation

## 2015-01-17 LAB — GLUCOSE, CAPILLARY
GLUCOSE-CAPILLARY: 97 mg/dL (ref 65–99)
GLUCOSE-CAPILLARY: 119 mg/dL — AB (ref 65–99)

## 2015-01-17 LAB — BASIC METABOLIC PANEL
ANION GAP: 9 (ref 5–15)
BUN: 44 mg/dL — ABNORMAL HIGH (ref 6–20)
CHLORIDE: 105 mmol/L (ref 101–111)
CO2: 28 mmol/L (ref 22–32)
Calcium: 8.5 mg/dL — ABNORMAL LOW (ref 8.9–10.3)
Creatinine, Ser: 2.35 mg/dL — ABNORMAL HIGH (ref 0.61–1.24)
GFR calc non Af Amer: 29 mL/min — ABNORMAL LOW (ref >60)
GFR, EST AFRICAN AMERICAN: 34 mL/min — AB (ref >60)
GLUCOSE: 88 mg/dL (ref 65–99)
POTASSIUM: 3.8 mmol/L (ref 3.5–5.1)
Sodium: 142 mmol/L (ref 135–145)

## 2015-01-17 LAB — CBC
HEMATOCRIT: 32.1 % — AB (ref 39.0–52.0)
HEMOGLOBIN: 10.6 g/dL — AB (ref 13.0–17.0)
MCH: 29 pg (ref 26.0–34.0)
MCHC: 33 g/dL (ref 30.0–36.0)
MCV: 87.9 fL (ref 78.0–100.0)
Platelets: 229 10*3/uL (ref 150–400)
RBC: 3.65 MIL/uL — AB (ref 4.22–5.81)
RDW: 13.7 % (ref 11.5–15.5)
WBC: 7.2 10*3/uL (ref 4.0–10.5)

## 2015-01-17 LAB — BRAIN NATRIURETIC PEPTIDE: B Natriuretic Peptide: 438 pg/mL — ABNORMAL HIGH (ref 0.0–100.0)

## 2015-01-17 MED ORDER — INSULIN GLARGINE 100 UNIT/ML ~~LOC~~ SOLN: 10.0000 [IU] | Freq: Every day | SUBCUTANEOUS | Status: DC

## 2015-01-17 MED ORDER — CARVEDILOL 3.125 MG PO TABS: 3.1250 mg | ORAL_TABLET | Freq: Two times a day (BID) | ORAL | Status: DC

## 2015-01-17 MED ORDER — HYDRALAZINE HCL 50 MG PO TABS: 50.0000 mg | ORAL_TABLET | Freq: Three times a day (TID) | ORAL | Status: DC

## 2015-01-17 MED ORDER — TAMSULOSIN HCL 0.4 MG PO CAPS: 0.4000 mg | ORAL_CAPSULE | Freq: Every day | ORAL | Status: DC

## 2015-01-17 MED ORDER — SIMETHICONE 80 MG PO CHEW
80.0000 mg | CHEWABLE_TABLET | Freq: Once | ORAL | Status: AC
  Administered 2015-01-17: 80 mg via ORAL
  Filled 2015-01-17: qty 1

## 2015-01-17 MED ORDER — FUROSEMIDE 40 MG PO TABS: 40.0000 mg | ORAL_TABLET | Freq: Two times a day (BID) | ORAL | Status: DC

## 2015-01-17 MED ORDER — HYDROCODONE-ACETAMINOPHEN 5-325 MG PO TABS: 1.0000 | ORAL_TABLET | Freq: Four times a day (QID) | ORAL | Status: DC | PRN

## 2015-01-17 MED ORDER — INSULIN DETEMIR 100 UNIT/ML ~~LOC~~ SOLN: 10.0000 [IU] | Freq: Every day | SUBCUTANEOUS | Status: DC

## 2015-01-17 NOTE — Discharge Summary (Signed)
Physician Discharge Summary  Edgar Brewer [id number] DOB: 07-23-1960 DOA: 01/12/2015  PCP: [name], MD  Admit date: 01/12/2015 Discharge date: 01/17/2015  Recommendations for Outpatient Follow-up:  1. Pt will need to follow up with PCP in 1 week post discharge 2. Please obtain BMP to evaluate electrolytes and kidney function 3. Please also check CBC to evaluate Hg and Hct levels 4. Please note Glipizide stopped due to persistent hypoglycemia 5. Also Levemir Insulin changed to Lantus as pt could not afford Levemir  6. Pt started on Lasix 40 mg BID, weight on discharge 93.5 kg, advised to follow daily weights and to report > 3 lbs weight gain in 24- 48 hours  7. 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 [name], diabetes mellitus type 2 on insulin, essential hypertension, chronic varicose veins, who presented with main concern of progressively worsening upper and lower extremity edema, dyspnea with exertion and at rest. Pt reported he ran out of his medications few weeks ago including diuretics and insulin.  Assessment/Plan:    Principal Problem:  Acute on chronic combined systolic and diastolic CHF, NYHA class 1 (HCC) - last 2 D ECHO in May 2016 with EF 45% - pt currently on lasix 40 mg  PO BID, doing well, weight continues trending down - weight trend since admission: 215 --> 212 lbs --> 210 lbs   Active Problems:  Acute renal failure superimposed on stage 3 chronic kidney disease ([place]) - review of record indicate GFR in 34 - 50 's with Cr as high as 1.4 in April 2016 - with underlying risk factors of HTN, HLD, DM, GFR, pt meets criteria for CKD stage III and at high risk of progressive renal disease - Cr trending up from 2.11 --> 2.66 --> 2.3, suspect this to be related to Lasix and possibly new baseline    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   Accelerated hypertension - continue with coreg, lasix, hydralazine for now   Hematuria - resolved    Hypokalemia - supplemented and remains WNL this AM   Anemia of chronic kidney failure - drop in Hg since admission - no signs of bleeding   DVT prophylaxis - Heparin SQ   Code Status: Full.  Family Communication: plan of care discussed with the patient Disposition Plan: Home   IV access:  Peripheral IV  Procedures and diagnostic studies:   Dg Chest 2 View 01/13/2015 Grossly unchanged pulmonary vascular redistribution with interstitial prominence without definite pulmonary edema.  Ct Renal Stone Study 01/13/2015 No explanation for hematuria. 2. Volume overload. 3. Cholelithiasis. Colonic diverticulosis.   Medical Consultants:  Cardiology   Other Consultants:  PT  IAnti-Infectives:   None        Discharge Exam: Filed Vitals:    01/16/15 2128 01/17/15 0540  BP: 165/80 161/69  Pulse: 84 77  Temp: 98.3 F (36.8 C) 97.5 F (36.4 C)  Resp: 20 20   Filed Vitals:   01/16/15 0547 01/16/15 1405 01/16/15 2128 01/17/15 0540  BP: 138/61 153/67 165/80 161/69  Pulse: 80 77 84 77  Temp: 98.2 F (36.8 C) 98.6 F (37 C) 98.3 F (36.8 C) 97.5 F (36.4 C)  TempSrc: Oral Oral Axillary Oral  Resp: 18 20 20 20   Height:      Weight: 95.5 kg (210 lb 8.6 oz)   93.5 kg (206 lb 2.1 oz)  SpO2: 95% 96% 97% 97%    General: Pt is alert, follows commands appropriately, not in acute distress Cardiovascular: Regular rate and rhythm, no rubs, no gallops Respiratory: Clear to auscultation bilaterally, no wheezing, no crackles, no rhonchi Abdominal: Soft, non tender, non distended, bowel sounds +, no guarding Extremities: upper and lower extremity edema still present but much improved   Discharge Instructions  Discharge Instructions    Diet - low sodium heart healthy    Complete by:  As directed      Increase activity slowly    Complete by:  As directed             Medication List    STOP taking these medications        glipiZIDE 10 MG tablet  Commonly known as:  GLUCOTROL     ibuprofen 200 MG tablet  Commonly known as:  ADVIL,MOTRIN     insulin detemir 100 UNIT/ML injection  Commonly known as:  LEVEMIR     losartan 100 MG tablet  Commonly known as:  COZAAR     naproxen 500 MG tablet  Commonly known as:  NAPROSYN      TAKE these medications        acetaminophen 500 MG tablet  Commonly known as:  TYLENOL  Take 500 mg by mouth every 6 (six) hours as needed for mild pain.     carvedilol 3.125 MG tablet  Commonly known as:  COREG  Take 1 tablet (3.125 mg total) by mouth 2 (two) times daily with a meal.     furosemide 40 MG tablet  Commonly known as:  LASIX  Take 1 tablet (40 mg total) by mouth 2 (two) times daily.     gabapentin 300 MG capsule  Commonly known as:  NEURONTIN  Take 300 mg by mouth 3  (three) times daily.     hydrALAZINE 50 MG tablet  Commonly known as:  APRESOLINE  Take 1 tablet (50 mg total) by mouth every 8 (eight) hours.     HYDROcodone-acetaminophen 5-325 MG tablet  Commonly known as:  NORCO/VICODIN  Take 1 tablet by mouth every 6 (six) hours as needed for moderate pain.     insulin glargine 100 UNIT/ML injection  Commonly known as:  LANTUS  Inject 0.1 mLs (10 Units total) into the skin at bedtime.     PENTOXIFYLLINE ER PO  Take 400 mg by mouth 2 (two) times daily.  tamsulosin 0.4 MG Caps capsule  Commonly known as:  FLOMAX  Take 1 capsule (0.4 mg total) by mouth daily.     vitamin C 500 MG tablet  Commonly known as:  ASCORBIC ACID  Take 500 mg by mouth at bedtime.     Vitamin D3 2000 UNITS capsule  Take 2,000 Units by mouth daily.            Follow-up Information    Follow up with [facility].   Why:  HHRN, social worker   Contact information:   [address] [place] [address] [phone]       Follow up with Marlou Sa ERIC, MD.   Specialty:  Internal Medicine   Contact information:   [address] [place]  [address] [phone]        The results of significant diagnostics from this hospitalization (including imaging, microbiology, ancillary and laboratory) are listed below for reference.     Microbiology: No results found for this or any previous visit (from the past 240 hour(s)).   Labs: Basic Metabolic Panel:  Recent Labs Lab 01/12/15 2255 01/13/15 0932 01/14/15 0434 01/15/15 0430 01/16/15 0015 01/17/15 0524  NA 142  --  142 142 142 142  K 3.2*  --  4.0 3.9 4.2 3.8  CL 107  --  109 109 107 105  CO2 24  --  25 27 28 28   GLUCOSE 39*  --  138* 67 168* 88  BUN 29*  --  37* 43* 43* 44*  CREATININE 2.11* 2.15* 2.64* 2.65* 2.66* 2.35*  CALCIUM 8.4*  --  8.1* 8.1* 8.2* 8.5*   Liver Function Tests:  Recent Labs Lab 01/13/15 0820  AMMONIA 42*   CBC:  Recent Labs Lab  01/13/15 0932 01/14/15 0434 01/15/15 0430 01/16/15 0015 01/17/15 0524  WBC 7.3 7.2 7.2 7.4 7.2  HGB 10.8* 9.8* 10.1* 10.5* 10.6*  HCT 32.3* 29.9* 30.9* 32.3* 32.1*  MCV 88.0 88.7 89.3 90.0 87.9  PLT 241 218 227 238 229   Cardiac Enzymes:  Recent Labs Lab 01/15/15 1850 01/16/15 0015 01/16/15 0642  TROPONINI <0.03 <0.03 <0.03   BNP: BNP (last 3 results)  Recent Labs  05/04/14 1600 01/12/15 2254 01/17/15 0524  BNP 299.2* 405.4* 438.0*    ProBNP (last 3 results)  Recent Labs  06/24/14 1220  PROBNP 283.0*    CBG:  Recent Labs Lab 01/16/15 0743 01/16/15 1150 01/16/15 1609 01/16/15 2128 01/17/15 0739  GLUCAP 162* 157* 184* 161* 97     SIGNED: Time coordinating discharge: 30 minutes  MAGICK-Laikynn Pollio, MD  [facility] 01/17/2015, 10:46 AM Pager [phone]  If 7PM-7AM, please contact night-coverage [email or web address] Password [email or web address]

## 2015-01-17 NOTE — Progress Notes (Signed)
Sonia Baller RN, went over all discharge information with patient and family.  All questions answered.  IV taken out.  VSS.  Pt wheeled out by NT.

## 2015-01-17 NOTE — Discharge Instructions (Signed)
Acute Kidney Injury °Acute kidney injury is any condition in which there is sudden (acute) damage to the kidneys. Acute kidney injury was previously known as acute kidney failure or acute renal failure. The kidneys are two organs that lie on either side of the spine between the middle of the back and the front of the abdomen. The kidneys: °· Remove wastes and extra water from the blood.   °· Produce important hormones. These help keep bones strong, regulate blood pressure, and help create red blood cells.   °· Balance the fluids and chemicals in the blood and tissues. °A small amount of kidney damage may not cause problems, but a large amount of damage may make it difficult or impossible for the kidneys to work the way they should. Acute kidney injury may develop into long-lasting (chronic) kidney disease. It may also develop into a life-threatening disease called end-stage kidney disease. Acute kidney injury can get worse very quickly, so it should be treated right away. Early treatment may prevent other kidney diseases from developing. °CAUSES  °· A problem with blood flow to the kidneys. This may be caused by:   °¨ Blood loss.   °¨ Heart disease.   °¨ Severe burns.   °¨ Liver disease. °· Direct damage to the kidneys. This may be caused by: °¨ Some medicines.   °¨ A kidney infection.   °¨ Poisoning or consuming toxic substances.   °¨ A surgical wound.   °¨ A blow to the kidney area.   °· A problem with urine flow. This may be caused by:   °¨ Cancer.   °¨ Kidney stones.   °¨ An enlarged prostate. °SIGNS AND SYMPTOMS  °· Swelling (edema) of the legs, ankles, or feet.   °· Tiredness (lethargy).   °· Nausea or vomiting.   °· Confusion.   °· Problems with urination, such as:   °¨ Painful or burning feeling during urination.   °¨ Decreased urine production.   °¨ Frequent accidents in children who are potty trained.   °¨ Bloody urine.   °· Muscle twitches and cramps.   °· Shortness of breath.   °· Seizures.   °· Chest  pain or pressure. °Sometimes, no symptoms are present.  °DIAGNOSIS °Acute kidney injury may be detected and diagnosed by tests, including blood, urine, imaging, or kidney biopsy tests.  °TREATMENT °Treatment of acute kidney injury varies depending on the cause and severity of the kidney damage. In mild cases, no treatment may be needed. The kidneys may heal on their own. If acute kidney injury is more severe, your health care provider will treat the cause of the kidney damage, help the kidneys heal, and prevent complications from occurring. Severe cases may require a procedure to remove toxic wastes from the body (dialysis) or surgery to repair kidney damage. Surgery may involve:  °· Repair of a torn kidney.   °· Removal of an obstruction. °HOME CARE INSTRUCTIONS °· Follow your prescribed diet. °· Take medicines only as directed by your health care provider.  °· Do not take any new medicines (prescription, over-the-counter, or nutritional supplements) unless approved by your health care provider. Many medicines can worsen your kidney damage or may need to have the dose adjusted.   °· Keep all follow-up visits as directed by your health care provider. This is important. °· Observe your condition to make sure you are healing as expected. °SEEK IMMEDIATE MEDICAL CARE IF: °· You are feeling ill or have severe pain in the back or side.   °· Your symptoms return or you have new symptoms. °· You have any symptoms of end-stage kidney disease. These include:   °¨ Persistent itchiness.   °¨   Loss of appetite.   °¨ Headaches.   °¨ Abnormally dark or light skin. °¨ Numbness in the hands or feet.   °¨ Easy bruising.   °¨ Frequent hiccups.   °¨ Menstruation stops.   °· You have a fever. °· You have increased urine production. °· You have pain or bleeding when urinating. °MAKE SURE YOU:  °· Understand these instructions. °· Will watch your condition. °· Will get help right away if you are not doing well or get worse. °  °This  information is not intended to replace advice given to you by your health care provider. Make sure you discuss any questions you have with your health care provider. °  °Document Released: 07/26/2010 Document Revised: 01/31/2014 Document Reviewed: 09/09/2011 °Elsevier Interactive Patient Education ©2016 [employer] ° °

## 2015-03-20 ENCOUNTER — Encounter: Payer: Self-pay | Admitting: Vascular Surgery

## 2015-03-24 ENCOUNTER — Ambulatory Visit (INDEPENDENT_AMBULATORY_CARE_PROVIDER_SITE_OTHER): Payer: No Typology Code available for payment source | Admitting: Vascular Surgery

## 2015-03-24 ENCOUNTER — Encounter: Payer: Self-pay | Admitting: Vascular Surgery

## 2015-03-24 VITALS — BP 138/75 | HR 70 | Temp 97.4°F | Resp 18 | Ht 68.0 in | Wt 192.5 lb

## 2015-03-24 DIAGNOSIS — I83893 Varicose veins of bilateral lower extremities with other complications: Secondary | ICD-10-CM

## 2015-03-24 NOTE — Progress Notes (Signed)
Problems with Activities of Daily Living Secondary to Leg Pain  1. [name] states he has difficulty doing yard work due to leg pain and swelling.   2. [name] states he has difficulty with all activities that require prolonged standing due to leg pain and swelling.       Failure of  Conservative Therapy:  1. Worn 20-30 mm Hg thigh high compression hose >3 months with no relief of symptoms.  2. Frequently elevates legs-no relief of symptoms  3. Taken Ibuprofen 600 Mg TID with no relief of symptoms.

## 2015-03-24 NOTE — Progress Notes (Signed)
[facility]ist [facility]  Patient name: Edgar Brewer MRN: [id number] DOB: 18-Sep-1960 Sex: male  REASON FOR VISIT: Hollow up with severe venous hypertension left greater than right  HPI: Edgar Brewer is a 55 y.o. male here today for continued follow-up of severe venous hypertension. He has a significant cardiac disease with history of congestive heart failure as well. He reports that he has had some improvement in his discomfort due to his wearing of compression garment. He does still report achy sensation and heaviness with prolonged standing and significant swelling persists. He also has significant symptoms of neuropathy in his feet related to diabetes. He has had no further skin breakdown related to venous hypertension  Past Medical History  Diagnosis Date  . Diabetes mellitus without complication ([place])   . Neuropathy ([place])   . Varicose veins of lower extremities with complications [id number]  . Acute on chronic combined systolic and diastolic CHF, NYHA class 1 ([place]) 01/13/2015  . Varicose veins     Family History  Problem Relation Age of Onset  . Cancer Father     SOCIAL HISTORY: Social History  Substance Use Topics  . Smoking status: Former Smoker -- 1.00 packs/day    Types: Cigarettes    Quit date: 01/13/2015  . Smokeless tobacco: Never Used  . Alcohol Use: 0.6 oz/week    1 Glasses of wine per week     Comment: rare occassion    Allergies  Allergen Reactions  . Codeine Hives and Swelling    Swelling all over body     Current Outpatient Prescriptions  Medication Sig Dispense Refill  . carvedilol (COREG) 3.125 MG tablet Take 1 tablet (3.125 mg total) by mouth 2 (two) times daily with a meal. 60 tablet 1  . glipiZIDE (GLUCOTROL) 5 MG tablet Take by mouth daily before breakfast.    . hydrALAZINE (APRESOLINE) 50 MG tablet Take 1 tablet (50 mg total) by mouth every 8 (eight) hours. 90 tablet 1  . HYDROcodone-acetaminophen (NORCO/VICODIN) 5-325 MG  tablet Take 1 tablet by mouth every 6 (six) hours as needed for moderate pain. 30 tablet 0  . ibuprofen (ADVIL,MOTRIN) 400 MG tablet Take 400 mg by mouth at bedtime.    . insulin glargine (LANTUS) 100 UNIT/ML injection Inject 0.1 mLs (10 Units total) into the skin at bedtime. 10 mL 1  . losartan (COZAAR) 100 MG tablet Take 100 mg by mouth daily.    . vitamin C (ASCORBIC ACID) 500 MG tablet Take 500 mg by mouth at bedtime.    Marland Kitchen acetaminophen (TYLENOL) 500 MG tablet Take 500 mg by mouth every 6 (six) hours as needed for mild pain. Reported on 03/24/2015    . Cholecalciferol (VITAMIN D3) 2000 UNITS capsule Take 2,000 Units by mouth daily. Reported on 03/24/2015    . furosemide (LASIX) 40 MG tablet Take 1 tablet (40 mg total) by mouth 2 (two) times daily. (Patient not taking: Reported on 03/24/2015) 60 tablet 1  . gabapentin (NEURONTIN) 300 MG capsule Take 300 mg by mouth 3 (three) times daily. Reported on 03/24/2015    . PENTOXIFYLLINE ER PO Take 400 mg by mouth 2 (two) times daily. Reported on 03/24/2015    . tamsulosin (FLOMAX) 0.4 MG CAPS capsule Take 1 capsule (0.4 mg total) by mouth daily. (Patient not taking: Reported on 03/24/2015) 30 capsule 1   No current facility-administered medications for this visit.    REVIEW OF SYSTEMS:  [X]  denotes positive finding, [ ]  denotes negative finding  Cardiac  Comments:  Chest pain or chest pressure:    Shortness of breath upon exertion:    Short of breath when lying flat:    Irregular heart rhythm:        Vascular    Pain in calf, thigh, or hip brought on by ambulation:    Pain in feet at night that wakes you up from your sleep:  x   Blood clot in your veins:    Leg swelling:  x       Pulmonary    Oxygen at home:    Productive cough:     Wheezing:         Neurologic    Sudden weakness in arms or legs:     Sudden numbness in arms or legs:     Sudden onset of difficulty speaking or slurred speech:    Temporary loss of vision in one eye:       Problems with dizziness:         Gastrointestinal    Blood in stool:     Vomited blood:         Genitourinary    Burning when urinating:     Blood in urine:        Psychiatric    Major depression:         Hematologic    Bleeding problems:    Problems with blood clotting too easily:        Skin    Rashes or ulcers:        Constitutional    Fever or chills:      PHYSICAL EXAM: Filed Vitals:   03/24/15 1133  BP: 138/75  Pulse: 70  Temp: 97.4 F (36.3 C)  TempSrc: Oral  Resp: 18  Height: 5\' 8"  (1.727 m)  Weight: 192 lb 8 oz (87.317 kg)  SpO2: 98%    GENERAL: The patient is a well-nourished male, in no acute distress. The vital signs are documented above. VASCULAR: Palpable dorsalis pedis pulses bilaterally PULMONARY: There is good air exchange  MUSCULOSKELETAL: There are no major deformities or cyanosis. NEUROLOGIC: No focal weakness or paresthesias are detected. SKIN: There are no ulcers or rashes noted. Does have extensive hemosiderin development on his left leg more so with the than his right. PSYCHIATRIC: The patient has a normal affect. Does have hemosiderin deposits circumferentially from the knee distally on the left. This is more at the level of the ankle only on the right   DATA:  I reimaged his veins with SonoSite. This does show markedly dilated saphenous vein bilaterally more so on the left than on the right  MEDICAL ISSUES: Discussed options with patient. Feel that he would benefit from ablation of his left great saphenous vein and possibly right as well. We would make recommendations on the right depending on outcome on the left. He is asking whether he can continue his conservative treatment for initial 3 months. I spent this certainly would be acceptable. He will notify should he develop any new tissue loss or ulceration. Otherwise we'll see him again in 3 months for continued discussion No Follow-up on file.   Curt [facility] [place]: [phone]

## 2015-05-18 ENCOUNTER — Encounter (HOSPITAL_COMMUNITY): Payer: Self-pay | Admitting: Emergency Medicine

## 2015-05-18 ENCOUNTER — Emergency Department (HOSPITAL_COMMUNITY): Payer: Medicaid Other

## 2015-05-18 ENCOUNTER — Emergency Department (HOSPITAL_COMMUNITY)
Admission: EM | Admit: 2015-05-18 | Discharge: 2015-05-18 | Disposition: A | Discharge status: Home / Self Care | Payer: Medicaid Other | Attending: Emergency Medicine | Admitting: Emergency Medicine

## 2015-05-18 DIAGNOSIS — Z8669 Personal history of other diseases of the nervous system and sense organs: Secondary | ICD-10-CM | POA: Diagnosis not present

## 2015-05-18 DIAGNOSIS — Z79899 Other long term (current) drug therapy: Secondary | ICD-10-CM | POA: Insufficient documentation

## 2015-05-18 DIAGNOSIS — Z794 Long term (current) use of insulin: Secondary | ICD-10-CM | POA: Diagnosis not present

## 2015-05-18 DIAGNOSIS — R109 Unspecified abdominal pain: Secondary | ICD-10-CM | POA: Insufficient documentation

## 2015-05-18 DIAGNOSIS — R5383 Other fatigue: Secondary | ICD-10-CM | POA: Diagnosis not present

## 2015-05-18 DIAGNOSIS — I5043 Acute on chronic combined systolic (congestive) and diastolic (congestive) heart failure: Secondary | ICD-10-CM | POA: Diagnosis not present

## 2015-05-18 DIAGNOSIS — Z7984 Long term (current) use of oral hypoglycemic drugs: Secondary | ICD-10-CM | POA: Insufficient documentation

## 2015-05-18 DIAGNOSIS — Z791 Long term (current) use of non-steroidal anti-inflammatories (NSAID): Secondary | ICD-10-CM | POA: Insufficient documentation

## 2015-05-18 DIAGNOSIS — Z87891 Personal history of nicotine dependence: Secondary | ICD-10-CM | POA: Insufficient documentation

## 2015-05-18 DIAGNOSIS — R2243 Localized swelling, mass and lump, lower limb, bilateral: Secondary | ICD-10-CM | POA: Insufficient documentation

## 2015-05-18 DIAGNOSIS — E119 Type 2 diabetes mellitus without complications: Secondary | ICD-10-CM | POA: Insufficient documentation

## 2015-05-18 DIAGNOSIS — M7989 Other specified soft tissue disorders: Secondary | ICD-10-CM

## 2015-05-18 LAB — CBC
HEMATOCRIT: 27.9 % — AB (ref 39.0–52.0)
HEMOGLOBIN: 9.3 g/dL — AB (ref 13.0–17.0)
MCH: 29.5 pg (ref 26.0–34.0)
MCHC: 33.3 g/dL (ref 30.0–36.0)
MCV: 88.6 fL (ref 78.0–100.0)
Platelets: 232 10*3/uL (ref 150–400)
RBC: 3.15 MIL/uL — AB (ref 4.22–5.81)
RDW: 14.2 % (ref 11.5–15.5)
WBC: 6.2 10*3/uL (ref 4.0–10.5)

## 2015-05-18 LAB — BASIC METABOLIC PANEL
Anion gap: 5 (ref 5–15)
BUN: 46 mg/dL — AB (ref 6–20)
CHLORIDE: 111 mmol/L (ref 101–111)
CO2: 25 mmol/L (ref 22–32)
Calcium: 8.6 mg/dL — ABNORMAL LOW (ref 8.9–10.3)
Creatinine, Ser: 2.31 mg/dL — ABNORMAL HIGH (ref 0.61–1.24)
GFR calc Af Amer: 35 mL/min — ABNORMAL LOW (ref >60)
GFR calc non Af Amer: 30 mL/min — ABNORMAL LOW (ref >60)
Glucose, Bld: 164 mg/dL — ABNORMAL HIGH (ref 65–99)
POTASSIUM: 4 mmol/L (ref 3.5–5.1)
SODIUM: 141 mmol/L (ref 135–145)

## 2015-05-18 LAB — I-STAT TROPONIN, ED: Troponin i, poc: 0 ng/mL (ref 0.00–0.08)

## 2015-05-18 LAB — BRAIN NATRIURETIC PEPTIDE: B NATRIURETIC PEPTIDE 5: 492.3 pg/mL — AB (ref 0.0–100.0)

## 2015-05-18 MED ORDER — FUROSEMIDE 10 MG/ML IJ SOLN
80.0000 mg | Freq: Once | INTRAMUSCULAR | Status: AC
  Administered 2015-05-18: 80 mg via INTRAVENOUS
  Filled 2015-05-18: qty 8

## 2015-05-18 MED ORDER — LOSARTAN POTASSIUM 50 MG PO TABS
100.0000 mg | ORAL_TABLET | Freq: Once | ORAL | Status: AC
  Administered 2015-05-18: 100 mg via ORAL
  Filled 2015-05-18: qty 2

## 2015-05-18 NOTE — ED Provider Notes (Signed)
CSN: [id number]     Arrival date & time 05/18/15  1346 History   First MD Initiated Contact with Patient 05/18/15 1839     Chief Complaint  Patient presents with  . Leg Swelling     (Consider location/radiation/quality/duration/timing/severity/associated sxs/prior Treatment) HPI   Pt is a 55 yo male with PMH of DM, neuropathy and CHF who presents to the ED with leg swelling, onset 3 days. Pt reports over the weekend he began to have swelling of his legs which he notes worsened today. He reports having tightness to his legs and lower abdomen due to the swelling but denies any pain. He also endorses associated fatigue. Denies fever, chills, body aches, HA, visual changes, lightheadedness, dizziness, cough, SOB, CP, wheezing, palpitations, abdominal pain, N/V/D, urinary sxs, numbness, tingling, weakness. Pt reports he has been taking his home meds including lasix as prescribed and denies any recent changes to his medications. Pt reports taking his morning meds but denies taking his evening meds PTA. He notes he was last admitted in 12/2014 for fluid overload.   Past Medical History  Diagnosis Date  . Diabetes mellitus without complication ([place])   . Neuropathy ([place])   . Varicose veins of lower extremities with complications 99991111  . Acute on chronic combined systolic and diastolic CHF, NYHA class 1 ([place]) 01/13/2015  . Varicose veins    History reviewed. No pertinent past surgical history. Family History  Problem Relation Age of Onset  . Cancer Father    Social History  Substance Use Topics  . Smoking status: Former Smoker -- 1.00 packs/day    Types: Cigarettes    Quit date: 01/13/2015  . Smokeless tobacco: Never Used  . Alcohol Use: 0.6 oz/week    1 Glasses of wine per week     Comment: rare occassion    Review of Systems  Constitutional: Positive for fatigue.  Cardiovascular: Positive for leg swelling.  Gastrointestinal: Positive for abdominal pain ("fullness/tightness" to  lower abdomen).  All other systems reviewed and are negative.     Allergies  Codeine  Home Medications   Prior to Admission medications   Medication Sig Start Date End Date Taking? Authorizing Provider  carvedilol (COREG) 3.125 MG tablet Take 1 tablet (3.125 mg total) by mouth 2 (two) times daily with a meal. 01/17/15  Yes Theodis Blaze, MD  Docusate Calcium (STOOL SOFTENER PO) Take 1 capsule by mouth 2 (two) times daily. 1 every afternoon and 1 at bedtime.   Yes Historical Provider, MD  furosemide (LASIX) 20 MG tablet Take 20 mg by mouth 2 (two) times daily.   Yes Historical Provider, MD  glipiZIDE (GLUCOTROL) 5 MG tablet Take by mouth daily before breakfast.   Yes Historical Provider, MD  hydrALAZINE (APRESOLINE) 50 MG tablet Take 1 tablet (50 mg total) by mouth every 8 (eight) hours. 01/17/15  Yes Theodis Blaze, MD  ibuprofen (ADVIL,MOTRIN) 200 MG tablet Take 200 mg by mouth at bedtime.   Yes Historical Provider, MD  insulin glargine (LANTUS) 100 UNIT/ML injection Inject 0.1 mLs (10 Units total) into the skin at bedtime. 01/17/15  Yes Theodis Blaze, MD  losartan (COZAAR) 100 MG tablet Take 100 mg by mouth daily.   Yes Historical Provider, MD  vitamin C (ASCORBIC ACID) 500 MG tablet Take 1,000 mg by mouth at bedtime.    Yes Historical Provider, MD  furosemide (LASIX) 40 MG tablet Take 1 tablet (40 mg total) by mouth 2 (two) times daily. Patient not taking: Reported  on 05/18/2015 01/17/15   Theodis Blaze, MD  HYDROcodone-acetaminophen (NORCO/VICODIN) 5-325 MG tablet Take 1 tablet by mouth every 6 (six) hours as needed for moderate pain. Patient not taking: Reported on 05/18/2015 01/17/15   Theodis Blaze, MD  tamsulosin (FLOMAX) 0.4 MG CAPS capsule Take 1 capsule (0.4 mg total) by mouth daily. Patient not taking: Reported on 05/18/2015 01/17/15   Theodis Blaze, MD   BP 178/86 mmHg  Pulse 86  Temp(Src) 98 F (36.7 C) (Oral)  Resp 18  SpO2 93% Physical Exam  Constitutional: He is  oriented to person, place, and time. He appears well-developed and well-nourished. No distress.  HENT:  Head: Normocephalic and atraumatic.  Mouth/Throat: Oropharynx is clear and moist. No oropharyngeal exudate.  Eyes: Conjunctivae and EOM are normal. Pupils are equal, round, and reactive to light. Right eye exhibits no discharge. Left eye exhibits no discharge. No scleral icterus.  Neck: Normal range of motion. Neck supple.  Cardiovascular: Normal rate, regular rhythm, normal heart sounds and intact distal pulses.   Pulmonary/Chest: Effort normal and breath sounds normal. No respiratory distress. He has no wheezes. He has no rales. He exhibits no tenderness.  Abdominal: Soft. Bowel sounds are normal. He exhibits no distension and no mass. There is no tenderness. There is no rebound and no guarding.  Musculoskeletal: Normal range of motion. He exhibits edema. He exhibits no tenderness.  2+ pitting edema of left lower extremity extending up to knee. 1+ pitting edema of right lower extremity extending up to knee with venous stasis.   Neurological: He is alert and oriented to person, place, and time.  Skin: Skin is warm and dry. He is not diaphoretic.  Nursing note and vitals reviewed.   ED Course  Procedures (including critical care time) Labs Review Labs Reviewed  BASIC METABOLIC PANEL - Abnormal; Notable for the following:    Glucose, Bld 164 (*)    BUN 46 (*)    Creatinine, Ser 2.31 (*)    Calcium 8.6 (*)    GFR calc non Af Amer 30 (*)    GFR calc Af Amer 35 (*)    All other components within normal limits  CBC - Abnormal; Notable for the following:    RBC 3.15 (*)    Hemoglobin 9.3 (*)    HCT 27.9 (*)    All other components within normal limits  BRAIN NATRIURETIC PEPTIDE - Abnormal; Notable for the following:    B Natriuretic Peptide 492.3 (*)    All other components within normal limits  I-STAT TROPOININ, ED    Imaging Review Dg Chest 2 View  05/18/2015  CLINICAL DATA:   55 year old male with a history of heart failure and progressive lower extremity swelling. EXAM: CHEST  2 VIEW COMPARISON:  Prior chest x-ray 01/13/2015 FINDINGS: Stable mild cardiomegaly. Mediastinal contours are unchanged. Stable pulmonary vascular congestion without overt edema. No pleural effusion. No focal airspace consolidation. No acute osseous abnormality. IMPRESSION: Stable cardiomegaly and pulmonary vascular congestion without overt edema or effusion. Electronically Signed   By: Jacqulynn Cadet M.D.   On: 05/18/2015 15:36   I have personally reviewed and evaluated these images and lab results as part of my medical decision-making.   EKG Interpretation None      MDM   Final diagnoses:  Leg swelling    Patient presents with worsening leg swelling over the past 3 days with associated fatigue.Marland Kitchen History of CHF. Patient reports taking his Lasix as prescribed. Denies fever or any  other pain/complaints at this time. Initial BP 170/86, second BP 219/103, remaining vitals stable. Patient reports he has not taken his evening dose of his blood pressure medications. Patient given dose of both*and in the ED. Patient denies any symptoms concerning for hypertensive urgency/emergency. Exam revealed 2+ pitting edema of left lower extremity, 1+ pitting edema right lower extremity, lungs clear to auscultation bilaterally. EKG showed sinus rhythm. Troponin negative. BUN 46, creatinine 2.31 which appear to be consistent with patient's baseline values. Hemoglobin 9.3, baseline value 9-10. Chest x-ray showed stable cardiomegaly and pulmonary vascular congestion without edema or effusion. Patient given 80 mg IV Lasix in the ED.  Chart review shows pt has a history of chronic lower extremity swelling secondary to venous insufficiency. Echo performed on 06/17/2014 showed ejection fraction of [id number] with diastolic dysfunction. Pt reports taking his home lasix as prescribed, 10mg  twice daily.  Repeat vitals, BP  decrease to 178/86. On reevaluation patient continues to deny any pain or complaints at this time. Patient has had it total output of over 800 mL since dose of lasix. Patient does not appear to have an acute CHF exacerbation requiring admission. No signs of fluid overload in heart or lungs. Discussed results and plan for discharge with patient. Advised patient to continue taking his home meds as prescribed. Advised patient to call his PCPs office tomorrow morning regarding increasing his home dose of Lasix in scheduling a follow-up appointment. Discussed strict return precautions with patient. Patient and family member endorsed understanding and agree to plan.  Chesley Noon [place], [place] 05/19/15 0135  Leo Grosser, MD 05/19/15 [phone]

## 2015-05-18 NOTE — ED Notes (Signed)
Spoke with lab about running his BNP with blood that is already in lab.

## 2015-05-18 NOTE — ED Notes (Signed)
Over the weekend began having lower extremity swelling. Hx of heart failure, denies CP and SOB. Has been taking dieretics as prescribed without improvement.

## 2015-05-18 NOTE — Discharge Instructions (Signed)
Continue taking your home medications as prescribed. I recommend calling your primary care provider's office tomorrow morning to discuss increasing your home dose of lasix. I recommend scheduling a follow up appointment to be seen by your primary care provider in the next 2-3 days. Please return to the Emergency Department if symptoms worsen or new onset of fever, chest pain, difficulty breathing, cough, worsening swelling, abdominal pain, vomiting, weakness.

## 2015-06-11 ENCOUNTER — Emergency Department (HOSPITAL_COMMUNITY)
Admission: EM | Admit: 2015-06-11 | Discharge: 2015-06-11 | Disposition: A | Discharge status: Home / Self Care | Payer: Medicaid Other | Attending: Emergency Medicine | Admitting: Emergency Medicine

## 2015-06-11 ENCOUNTER — Emergency Department (HOSPITAL_COMMUNITY): Payer: Medicaid Other

## 2015-06-11 ENCOUNTER — Encounter (HOSPITAL_COMMUNITY): Payer: Self-pay | Admitting: *Deleted

## 2015-06-11 DIAGNOSIS — S67193A Crushing injury of left middle finger, initial encounter: Secondary | ICD-10-CM | POA: Diagnosis present

## 2015-06-11 DIAGNOSIS — Z23 Encounter for immunization: Secondary | ICD-10-CM | POA: Diagnosis not present

## 2015-06-11 DIAGNOSIS — Y929 Unspecified place or not applicable: Secondary | ICD-10-CM | POA: Insufficient documentation

## 2015-06-11 DIAGNOSIS — Z79899 Other long term (current) drug therapy: Secondary | ICD-10-CM | POA: Insufficient documentation

## 2015-06-11 DIAGNOSIS — I5043 Acute on chronic combined systolic (congestive) and diastolic (congestive) heart failure: Secondary | ICD-10-CM | POA: Insufficient documentation

## 2015-06-11 DIAGNOSIS — E1140 Type 2 diabetes mellitus with diabetic neuropathy, unspecified: Secondary | ICD-10-CM | POA: Insufficient documentation

## 2015-06-11 DIAGNOSIS — Y939 Activity, unspecified: Secondary | ICD-10-CM | POA: Diagnosis not present

## 2015-06-11 DIAGNOSIS — Z87891 Personal history of nicotine dependence: Secondary | ICD-10-CM | POA: Diagnosis not present

## 2015-06-11 DIAGNOSIS — Y999 Unspecified external cause status: Secondary | ICD-10-CM | POA: Insufficient documentation

## 2015-06-11 DIAGNOSIS — S61213A Laceration without foreign body of left middle finger without damage to nail, initial encounter: Secondary | ICD-10-CM | POA: Diagnosis not present

## 2015-06-11 DIAGNOSIS — Z794 Long term (current) use of insulin: Secondary | ICD-10-CM | POA: Diagnosis not present

## 2015-06-11 DIAGNOSIS — Z7984 Long term (current) use of oral hypoglycemic drugs: Secondary | ICD-10-CM | POA: Insufficient documentation

## 2015-06-11 DIAGNOSIS — W208XXA Other cause of strike by thrown, projected or falling object, initial encounter: Secondary | ICD-10-CM | POA: Insufficient documentation

## 2015-06-11 DIAGNOSIS — S61219A Laceration without foreign body of unspecified finger without damage to nail, initial encounter: Secondary | ICD-10-CM

## 2015-06-11 MED ORDER — TETANUS-DIPHTH-ACELL PERTUSSIS 5-2.5-18.5 LF-MCG/0.5 IM SUSP
0.5000 mL | Freq: Once | INTRAMUSCULAR | Status: AC
  Administered 2015-06-11: 0.5 mL via INTRAMUSCULAR
  Filled 2015-06-11: qty 0.5

## 2015-06-11 MED ORDER — LIDOCAINE HCL 1 % IJ SOLN
10.0000 mL | Freq: Once | INTRAMUSCULAR | Status: AC
  Administered 2015-06-11: 1 mL
  Filled 2015-06-11: qty 20

## 2015-06-11 NOTE — ED Notes (Signed)
Pt stated "I take b/p meds in the morning & in the evening."

## 2015-06-11 NOTE — ED Provider Notes (Signed)
CSN: [id number]     Arrival date & time 06/11/15  1938 History   By signing my name below, I, Rowan Blase, attest that this documentation has been prepared under the direction and in the presence of non-physician practitioner, Clayton Bibles, PA-C. Electronically Signed: Rowan Blase, Scribe. 06/11/2015. 9:10 PM.   Chief Complaint  Patient presents with  . Finger Injury   The history is provided by the patient. No language interpreter was used.   HPI Comments:  Edgar Brewer is a 55 y.o. male with PMHx of DM, neuropathy and CHF who presents to the Emergency Department complaining of small painful laceration to left middle finger. Pt was changing a window screen when the window fell down on his hand, smashing his left middle finger. No alleviating factors noted or treatments attempted PTA. Bleeding controlled PTA. Pt is unsure of last Tetanus. He is right-handed. Denies numbness or weakness.  Denies other injury.    Past Medical History  Diagnosis Date  . Diabetes mellitus without complication ([place])   . Neuropathy ([place])   . Varicose veins of lower extremities with complications 99991111  . Acute on chronic combined systolic and diastolic CHF, NYHA class 1 ([place]) 01/13/2015  . Varicose veins    History reviewed. No pertinent past surgical history. Family History  Problem Relation Age of Onset  . Cancer Father    Social History  Substance Use Topics  . Smoking status: Former Smoker -- 1.00 packs/day    Types: Cigarettes    Quit date: 01/13/2015  . Smokeless tobacco: Never Used  . Alcohol Use: 0.6 oz/week    1 Glasses of wine per week     Comment: rare occassion    Review of Systems  Constitutional: Negative for fever, activity change and appetite change.  Musculoskeletal: Positive for arthralgias.  Skin: Positive for wound. Negative for color change, pallor and rash.  Neurological: Negative for weakness and numbness.  Hematological: Does not bruise/bleed easily.   Psychiatric/Behavioral: Negative for self-injury.   Allergies  Codeine  Home Medications   Prior to Admission medications   Medication Sig Start Date End Date Taking? Authorizing Provider  carvedilol (COREG) 3.125 MG tablet Take 1 tablet (3.125 mg total) by mouth 2 (two) times daily with a meal. 01/17/15   Theodis Blaze, MD  Docusate Calcium (STOOL SOFTENER PO) Take 1 capsule by mouth 2 (two) times daily. 1 every afternoon and 1 at bedtime.    Historical Provider, MD  furosemide (LASIX) 20 MG tablet Take 20 mg by mouth 2 (two) times daily.    Historical Provider, MD  furosemide (LASIX) 40 MG tablet Take 1 tablet (40 mg total) by mouth 2 (two) times daily. Patient not taking: Reported on 05/18/2015 01/17/15   Theodis Blaze, MD  glipiZIDE (GLUCOTROL) 5 MG tablet Take by mouth daily before breakfast.    Historical Provider, MD  hydrALAZINE (APRESOLINE) 50 MG tablet Take 1 tablet (50 mg total) by mouth every 8 (eight) hours. 01/17/15   Theodis Blaze, MD  HYDROcodone-acetaminophen (NORCO/VICODIN) 5-325 MG tablet Take 1 tablet by mouth every 6 (six) hours as needed for moderate pain. Patient not taking: Reported on 05/18/2015 01/17/15   Theodis Blaze, MD  ibuprofen (ADVIL,MOTRIN) 200 MG tablet Take 200 mg by mouth at bedtime.    Historical Provider, MD  insulin glargine (LANTUS) 100 UNIT/ML injection Inject 0.1 mLs (10 Units total) into the skin at bedtime. 01/17/15   Theodis Blaze, MD  losartan (COZAAR) 100 MG tablet  Take 100 mg by mouth daily.    Historical Provider, MD  tamsulosin (FLOMAX) 0.4 MG CAPS capsule Take 1 capsule (0.4 mg total) by mouth daily. Patient not taking: Reported on 05/18/2015 01/17/15   Theodis Blaze, MD  vitamin C (ASCORBIC ACID) 500 MG tablet Take 1,000 mg by mouth at bedtime.     Historical Provider, MD   BP 137/66 mmHg  Pulse 63  Temp(Src) 98.1 F (36.7 C) (Oral)  Resp 18  SpO2 100%   Physical Exam  Constitutional: He appears well-developed and well-nourished. No  distress.  HENT:  Head: Normocephalic and atraumatic.  Neck: Neck supple.  Pulmonary/Chest: Effort normal.  Musculoskeletal:  Left hand: Full active range of motion of all digits, strength 5/5, sensation intact, capillary refill < 2 seconds. Laceration over palmar aspect of 3rd finger proximal phalanx.  Neurological: He is alert.  Skin: He is not diaphoretic.  Nursing note and vitals reviewed.   ED Course  Procedures  DIAGNOSTIC STUDIES:  Oxygen Saturation is 100% on RA, normal by my interpretation.    COORDINATION OF CARE:  9:08 PM Will update last tetanus and repair laceration. Discussed treatment plan with parents at bedside and parents agreed to plan.  Labs Review Labs Reviewed - No data to display  Imaging Review Dg Finger Middle Left  06/11/2015  CLINICAL DATA:  Was changing a window screen today when the window fell smashing LEFT middle finger, laceration, injury, initial encounter EXAM: LEFT MIDDLE FINGER 2+V COMPARISON:  None FINDINGS: Soft tissue swelling LEFT middle finger. Osseous mineralization normal. Joint spaces preserved. No acute fracture, dislocation or bone destruction. IMPRESSION: No acute osseous abnormalities. Electronically Signed   By: Lavonia Dana M.D.   On: 06/11/2015 21:02   I have personally reviewed and evaluated these images as part of my medical decision-making.   EKG Interpretation None     LACERATION REPAIR Performed by: Clayton Bibles, PA-C Consent: Verbal consent obtained. Risks and benefits: risks, benefits and alternatives were discussed Patient identity confirmed: provided demographic data Time out performed prior to procedure Prepped and Draped in normal sterile fashion Wound explored Laceration Location: palmar aspect of 3rd finger proximal phalanx Laceration Length: 2cm No Foreign Bodies seen or palpated Anesthesia: local infiltration Local anesthetic: lidocaine 1% Anesthetic total: 3 ml Irrigation method: syringe Amount of  cleaning: standard Skin closure: 5-0 vicryl Number of sutures or staples: 5 Technique: simple interrupted  Patient tolerance: Patient tolerated the procedure well with no immediate complications.  MDM   Final diagnoses:  Finger laceration, initial encounter    Afebrile, nontoxic patient with injury to his left 3rd finger while attempting to move a screen from the window and the window fell on his hand.   Xray negative.  Neurovascularly intact.  Laceration repaired in ED.   D/C home with wound care instructions, return precautions.  Absorbable sutures placed.  Discussed result, findings, treatment, and follow up  with patient.  Pt given return precautions.  Pt verbalizes understanding and agrees with plan.      I personally performed the services described in this documentation, which was scribed in my presence. The recorded information has been reviewed and is accurate.   Clayton Bibles, PA-C 06/11/15 2210  Charlesetta Shanks, MD 06/11/15 [phone]

## 2015-06-11 NOTE — Discharge Instructions (Signed)
Read the information below.  You may return to the Emergency Department at any time for worsening condition or any new symptoms that concern you.  If you develop redness, swelling, pus draining from the wound, difficulty moving your fingers, or fevers greater than 100.4, return to the ER immediately for a recheck.     Laceration Care, Adult A laceration is a cut that goes through all layers of the skin. The cut also goes into the tissue that is right under the skin. Some cuts heal on their own. Others need to be closed with stitches (sutures), staples, skin adhesive strips, or wound glue. Taking care of your cut lowers your risk of infection and helps your cut to heal better. HOW TO TAKE CARE OF YOUR CUT For stitches or staples:  Keep the wound clean and dry.  If you were given a bandage (dressing), you should change it at least one time per day or as told by your doctor. You should also change it if it gets wet or dirty.  Keep the wound completely dry for the first 24 hours or as told by your doctor. After that time, you may take a shower or a bath. However, make sure that the wound is not soaked in water until after the stitches or staples have been removed.  Clean the wound one time each day or as told by your doctor:  Wash the wound with soap and water.  Rinse the wound with water until all of the soap comes off.  Pat the wound dry with a clean towel. Do not rub the wound.  After you clean the wound, put a thin layer of antibiotic ointment on it as told by your doctor. This ointment:  Helps to prevent infection.  Keeps the bandage from sticking to the wound.  Have your stitches or staples removed as told by your doctor. If your doctor used skin adhesive strips:   Keep the wound clean and dry.  If you were given a bandage, you should change it at least one time per day or as told by your doctor. You should also change it if it gets dirty or wet.  Do not get the skin adhesive  strips wet. You can take a shower or a bath, but be careful to keep the wound dry.  If the wound gets wet, pat it dry with a clean towel. Do not rub the wound.  Skin adhesive strips fall off on their own. You can trim the strips as the wound heals. Do not remove any strips that are still stuck to the wound. They will fall off after a while. If your doctor used wound glue:  Try to keep your wound dry, but you may briefly wet it in the shower or bath. Do not soak the wound in water, such as by swimming.  After you take a shower or a bath, gently pat the wound dry with a clean towel. Do not rub the wound.  Do not do any activities that will make you really sweaty until the skin glue has fallen off on its own.  Do not apply liquid, cream, or ointment medicine to your wound while the skin glue is still on.  If you were given a bandage, you should change it at least one time per day or as told by your doctor. You should also change it if it gets dirty or wet.  If a bandage is placed over the wound, do not let the tape for  the bandage touch the skin glue.  Do not pick at the glue. The skin glue usually stays on for 5-10 days. Then, it falls off of the skin. General Instructions  To help prevent scarring, make sure to cover your wound with sunscreen whenever you are outside after stitches are removed, after adhesive strips are removed, or when wound glue stays in place and the wound is healed. Make sure to wear a sunscreen of at least 30 SPF.  Take over-the-counter and prescription medicines only as told by your doctor.  If you were given antibiotic medicine or ointment, take or apply it as told by your doctor. Do not stop using the antibiotic even if your wound is getting better.  Do not scratch or pick at the wound.  Keep all follow-up visits as told by your doctor. This is important.  Check your wound every day for signs of infection. Watch for:  Redness, swelling, or pain.  Fluid,  blood, or pus.  Raise (elevate) the injured area above the level of your heart while you are sitting or lying down, if possible. GET HELP IF:  You got a tetanus shot and you have any of these problems at the injection site:  Swelling.  Very bad pain.  Redness.  Bleeding.  You have a fever.  A wound that was closed breaks open.  You notice a bad smell coming from your wound or your bandage.  You notice something coming out of the wound, such as wood or glass.  Medicine does not help your pain.  You have more redness, swelling, or pain at the site of your wound.  You have fluid, blood, or pus coming from your wound.  You notice a change in the color of your skin near your wound.  You need to change the bandage often because fluid, blood, or pus is coming from the wound.  You start to have a new rash.  You start to have numbness around the wound. GET HELP RIGHT AWAY IF:  You have very bad swelling around the wound.  Your pain suddenly gets worse and is very bad.  You notice painful lumps near the wound or on skin that is anywhere on your body.  You have a red streak going away from your wound.  The wound is on your hand or foot and you cannot move a finger or toe like you usually can.  The wound is on your hand or foot and you notice that your fingers or toes look pale or bluish.   This information is not intended to replace advice given to you by your health care provider. Make sure you discuss any questions you have with your health care provider.   Document Released: 06/29/2007 Document Revised: 05/27/2014 Document Reviewed: 01/06/2014 Elsevier Interactive Patient Education 2016 [place], or Adhesive Wound Closure Doctors use stitches (sutures), staples, and certain glue (skin adhesives) to hold your skin together while it heals (wound closure). You may need this treatment after you have surgery or if you cut your skin accidentally.  These methods help your skin heal more quickly. They also make it less likely that you will have a scar. WHAT ARE THE DIFFERENT KINDS OF WOUND CLOSURES? There are many options for wound closure. The one that your doctor uses depends on how deep and large your wound is. Adhesive Glue To use this glue to close a wound, your doctor holds the edges of the wound together and paints the glue  on the surface of your skin. You may need more than one layer of glue. Then the wound may be covered with a light bandage (dressing). This type of skin closure may be used for small wounds that are not deep (superficial). Using glue for wound closure is less painful than other methods. It does not require a medicine that numbs the area. This method also leaves nothing to be removed. Adhesive glue is often used for children and on facial wounds. Adhesive glue cannot be used for wounds that are deep, uneven, or bleeding. It is not used inside of a wound.  Adhesive Strips These strips are made of sticky (adhesive), porous paper. They are placed across your skin edges like a regular adhesive bandage. You leave them on until they fall off. Adhesive strips may be used to close very superficial wounds. They may also be used along with sutures to improve closure of your skin edges.  Sutures Sutures are the oldest method of wound closure. Sutures can be made from natural or synthetic materials. They can be made from a material that your body can break down as your wound heals (absorbable), or they can be made from a material that needs to be removed from your skin (nonabsorbable). They come in many different strengths and sizes. Your doctor attaches the sutures to a steel needle on one end. Sutures can be passed through your skin, or through the tissues beneath your skin. Then they are tied and cut. Your skin edges may be closed in one continuous stitch or in separate stitches. Sutures are strong and can be used for all kinds of  wounds. Absorbable sutures may be used to close tissues under the skin. The disadvantage of sutures is that they may cause skin reactions that lead to infection. Nonabsorbable sutures need to be removed. Staples When surgical staples are used to close a wound, the edges of your skin on both sides of the wound are brought close together. A staple is placed across the wound, and an instrument secures the edges together. Staples are often used to close surgical cuts (incisions). Staples are faster to use than sutures, and they cause less reaction from your skin. Staples need to be removed using a tool that bends the staples away from your skin. HOW DO I CARE FOR MY WOUND CLOSURE?  Take medicines only as told by your doctor.  If you were prescribed an antibiotic medicine for your wound, finish it all even if you start to feel better.  Use ointments or creams only as told by your doctor.  Wash your hands with soap and water before and after touching your wound.  Do not soak your wound in water. Do not take baths, swim, or use a hot tub until your doctor says it is okay.  Ask your doctor when you can start showering. Cover your wound if told by your doctor.  Do not take out your own sutures or staples.  Do not pick at your wound. Picking can cause an infection.  Keep all follow-up visits as told by your doctor. This is important. HOW LONG WILL I HAVE MY WOUND CLOSURE?   Leave adhesive glue on your skin until the glue peels away.  Leave adhesive strips on your skin until they fall off.  Absorbable sutures will dissolve within several days.  Nonabsorbable sutures and staples must be removed. The location of the wound will determine how long they stay in. This can range from several days to a  couple of weeks. WHEN SHOULD I SEEK HELP FOR MY WOUND CLOSURE? Contact your doctor if:  You have a fever.  You have chills.  You have redness, puffiness (swelling), or pain at the site of your  wound.  You have fluid, blood, or pus coming from your wound.  There is a bad smell coming from your wound.  The skin edges of your wound start to separate after your sutures have been removed.  Your wound becomes thick, raised, and darker in color after your sutures come out (scarring).   This information is not intended to replace advice given to you by your health care provider. Make sure you discuss any questions you have with your health care provider.   Document Released: 11/07/2008 Document Revised: 01/31/2014 Document Reviewed: 06/19/2013 Elsevier Interactive Patient Education [employer].

## 2015-06-11 NOTE — ED Notes (Signed)
Pt states that he was changing a window screen and it fell and smashed his left middle finger; pt with laceration to left middle finger

## 2015-06-17 ENCOUNTER — Encounter: Payer: Self-pay | Admitting: Vascular Surgery

## 2015-06-23 ENCOUNTER — Ambulatory Visit (INDEPENDENT_AMBULATORY_CARE_PROVIDER_SITE_OTHER): Payer: No Typology Code available for payment source | Admitting: Vascular Surgery

## 2015-06-23 ENCOUNTER — Encounter: Payer: Self-pay | Admitting: Vascular Surgery

## 2015-06-23 VITALS — BP 143/72 | HR 69 | Temp 97.5°F | Resp 18 | Ht 68.0 in | Wt 185.7 lb

## 2015-06-23 DIAGNOSIS — I83893 Varicose veins of bilateral lower extremities with other complications: Secondary | ICD-10-CM

## 2015-06-23 NOTE — Progress Notes (Signed)
Filed Vitals:   06/23/15 1344 06/23/15 1350  BP: 146/76 143/72  Pulse: 69   Temp: 97.5 F (36.4 C)   TempSrc: Oral   Resp: 18   Height: 5\' 8"  (1.727 m)   Weight: 185 lb 11.2 oz (84.233 kg)   SpO2: 98%

## 2015-06-23 NOTE — Progress Notes (Signed)
Vascular and Vein Specialist of [facility]  Patient name: Edgar Brewer MRN: [id number] DOB: 12/22/1960 Sex: male  REASON FOR VISIT: Follow-up venous hypertension  HPI: Edgar Brewer is a 55 y.o. male here today for continued discussion of venous hypertension bilateral lower extremities left greater than right. Since my last visit he has had readmission for leg swelling related to congestive heart failure. He has had some progression in the changes of chronic venous hypertension in his left leg extending down into his foot with a darkening of his foot and telangiectasia. He does have consistent pain associated with this as well. He is here today with his mother for further discussion  Past Medical History  Diagnosis Date  . Diabetes mellitus without complication ([place])   . Neuropathy ([place])   . Varicose veins of lower extremities with complications 99991111  . Acute on chronic combined systolic and diastolic CHF, NYHA class 1 ([place]) 01/13/2015  . Varicose veins     Family History  Problem Relation Age of Onset  . Cancer Father     SOCIAL HISTORY: Social History  Substance Use Topics  . Smoking status: Former Smoker -- 1.00 packs/day    Types: Cigarettes    Quit date: 01/13/2015  . Smokeless tobacco: Never Used  . Alcohol Use: 0.6 oz/week    1 Glasses of wine per week     Comment: rare occassion    Allergies  Allergen Reactions  . Codeine Hives and Swelling    Swelling all over body     Current Outpatient Prescriptions  Medication Sig Dispense Refill  . carvedilol (COREG) 3.125 MG tablet Take 1 tablet (3.125 mg total) by mouth 2 (two) times daily with a meal. 60 tablet 1  . Docusate Calcium (STOOL SOFTENER PO) Take 1 capsule by mouth 2 (two) times daily. 1 every afternoon and 1 at bedtime.    . furosemide (LASIX) 40 MG tablet Take 1 tablet (40 mg total) by mouth 2 (two) times daily. 60 tablet 1  . glipiZIDE (GLUCOTROL) 5 MG tablet Take by mouth daily before breakfast.      . hydrALAZINE (APRESOLINE) 50 MG tablet Take 1 tablet (50 mg total) by mouth every 8 (eight) hours. 90 tablet 1  . ibuprofen (ADVIL,MOTRIN) 200 MG tablet Take 200 mg by mouth at bedtime.    . insulin glargine (LANTUS) 100 UNIT/ML injection Inject 0.1 mLs (10 Units total) into the skin at bedtime. 10 mL 1  . losartan (COZAAR) 100 MG tablet Take 100 mg by mouth daily.    . vitamin C (ASCORBIC ACID) 500 MG tablet Take 1,000 mg by mouth at bedtime.     . furosemide (LASIX) 20 MG tablet Take 20 mg by mouth 2 (two) times daily. Reported on 06/23/2015    . HYDROcodone-acetaminophen (NORCO/VICODIN) 5-325 MG tablet Take 1 tablet by mouth every 6 (six) hours as needed for moderate pain. (Patient not taking: Reported on 05/18/2015) 30 tablet 0  . tamsulosin (FLOMAX) 0.4 MG CAPS capsule Take 1 capsule (0.4 mg total) by mouth daily. (Patient not taking: Reported on 05/18/2015) 30 capsule 1   No current facility-administered medications for this visit.    REVIEW OF SYSTEMS:  [X]  denotes positive finding, [ ]  denotes negative finding Cardiac  Comments:  Chest pain or chest pressure:     Shortness of breath upon exertion:    Short of breath when lying flat:    Irregular heart rhythm:        Vascular  Pain in calf, thigh, or hip brought on by ambulation:    Pain in feet at night that wakes you up from your sleep:     Blood clot in your veins:    Leg swelling:         Pulmonary    Oxygen at home:    Productive cough:     Wheezing:         Neurologic    Sudden weakness in arms or legs:     Sudden numbness in arms or legs:     Sudden onset of difficulty speaking or slurred speech:    Temporary loss of vision in one eye:     Problems with dizziness:         Gastrointestinal    Blood in stool:     Vomited blood:         Genitourinary    Burning when urinating:     Blood in urine:        Psychiatric    Major depression:         Hematologic    Bleeding problems:    Problems with blood  clotting too easily:        Skin    Rashes or ulcers:        Constitutional    Fever or chills:      PHYSICAL EXAM: Filed Vitals:   06/23/15 1344 06/23/15 1350  BP: 146/76 143/72  Pulse: 69   Temp: 97.5 F (36.4 C)   TempSrc: Oral   Resp: 18   Height: 5\' 8"  (1.727 m)   Weight: 185 lb 11.2 oz (84.233 kg)   SpO2: 98%     GENERAL: The patient is a well-nourished male, in no acute distress. The vital signs are documented above. VASCULAR: 2+ dorsalis pedis pulses bilaterally PULMONARY: There is good air exchange MUSCULOSKELETAL: There are no major deformities or cyanosis. NEUROLOGIC: No focal weakness or paresthesias are detected. SKIN: Circumferential hemosiderin deposit from mid calf down to his ankle and extending onto his left foot PSYCHIATRIC: The patient has a normal affect.    MEDICAL ISSUES: Progressive changes of venous hypertension. Patient wished to proceed with left leg laser ablation for symptom relief. I also has severe reflux in his right leg with the changes of chronic venous insufficiency. Will proceed with left leg ablation and then possibly right leg depending on his outcome with left leg. Discussed the procedures an outpatient procedure under local anesthesia taking approximately 1 hour. Explained the slight risk of DVT associated with the procedure. He understands and wishes to proceed as soon as possible    Quinci Gavidia, Estate manager/land agent of [employer] [phone]

## 2015-06-23 NOTE — Progress Notes (Signed)
Problems with Activities of Daily Living Secondary to Leg Pain  1. Mr. Tanton states that all activities that require prolonged standing are very difficult due to leg pain.    2. Mr. Bockus states that yard work is difficult due to leg pain.      Failure of  Conservative Therapy:  1. Worn 20-30 mm Hg thigh high compression hose >3 months with no relief of symptoms.  2. Frequently elevates legs-no relief of symptoms  3. Taken Ibuprofen 600 Mg TID with no relief of symptoms.

## 2015-06-25 ENCOUNTER — Other Ambulatory Visit: Payer: Self-pay | Admitting: *Deleted

## 2015-06-25 DIAGNOSIS — I83893 Varicose veins of bilateral lower extremities with other complications: Secondary | ICD-10-CM

## 2015-07-16 ENCOUNTER — Encounter: Payer: Self-pay | Admitting: Vascular Surgery

## 2015-07-20 ENCOUNTER — Encounter: Payer: Self-pay | Admitting: Vascular Surgery

## 2015-07-23 ENCOUNTER — Encounter: Payer: Self-pay | Admitting: Vascular Surgery

## 2015-07-23 ENCOUNTER — Ambulatory Visit (INDEPENDENT_AMBULATORY_CARE_PROVIDER_SITE_OTHER): Payer: No Typology Code available for payment source | Admitting: Vascular Surgery

## 2015-07-23 VITALS — BP 136/69 | HR 68 | Temp 97.0°F | Resp 16 | Ht 68.0 in | Wt 181.0 lb

## 2015-07-23 DIAGNOSIS — I83893 Varicose veins of bilateral lower extremities with other complications: Secondary | ICD-10-CM

## 2015-07-23 DIAGNOSIS — I868 Varicose veins of other specified sites: Secondary | ICD-10-CM

## 2015-07-23 HISTORY — PX: ENDOVENOUS ABLATION SAPHENOUS VEIN W/ LASER: SUR449

## 2015-07-23 NOTE — Progress Notes (Signed)
     Laser Ablation Procedure    Date: 07/23/2015   Edgar Brewer DOB:[birth year]  Consent signed: Yes    Surgeon:  Dr. Sherren Mocha Stefanee Mckell  Procedure: Laser Ablation: left Greater Saphenous Vein  BP 136/69 mmHg  Pulse 68  Temp(Src) 97 F (36.1 C) (Oral)  Resp 16  Ht 5\' 8"  (1.727 m)  Wt 181 lb (82.101 kg)  BMI 27.53 kg/m2  SpO2 97%  Tumescent Anesthesia: 475 cc 0.9% NaCl with 50 cc Lidocaine HCL with 1% Epi and 15 cc 8.4% NaHCO3  Local Anesthesia: 3 cc Lidocaine HCL and NaHCO3 (ratio 2:1)  15 watts continuous mode        Total energy: 2248 Joules   Total time: 2:30      Patient tolerated procedure well    Description of Procedure:  After marking the course of the secondary varicosities, the patient was placed on the operating table in the supine position, and the left leg was prepped and draped in sterile fashion.   Local anesthetic was administered and under ultrasound guidance the saphenous vein was accessed with a micro needle and guide wire; then the mirco puncture sheath was placed.  A guide wire was inserted saphenofemoral junction , followed by a 5 french sheath.  The position of the sheath and then the laser fiber below the junction was confirmed using the ultrasound.  Tumescent anesthesia was administered along the course of the saphenous vein using ultrasound guidance. The patient was placed in Trendelenburg position and protective laser glasses were placed on patient and staff, and the laser was fired at 15 watts continuous mode advancing 1-10mm/second for a total of 2248 joules.       Steri strips were applied and ABD pads and thigh high compression stockings were applied.  Ace wrap bandages were applied  at the top of the saphenofemoral junction. Blood loss was less than 15 cc.  The patient ambulated out of the operating room having tolerated the procedure well.  Uneventful ablation from just below the knee to just below the saphenofemoral junction. Follow-up in one  week

## 2015-07-24 ENCOUNTER — Telehealth: Payer: Self-pay | Admitting: *Deleted

## 2015-07-24 NOTE — Telephone Encounter (Signed)
    07/24/2015  Time: 9:22 AM   Patient Name: Edgar Brewer  Patient of: T.F. Early  Procedure:Laser Ablation left greater saphenous vein  07-23-2015   Reached patient at home and checked  His status  Yes    Comments/Actions Taken: Spoke with Joeanthony Batie's mother whom he lives with and is his caretaker. Mrs. Leibman states that Tyjai " seems to be doing well."  She states he has some pain left inner thigh (area treated) that is relieved with ice compress and Ibuprofen.  Reviewed post procedural instructions with Mrs. Dietze and reminded her of Ladarryl's post LA duplex and VV follow up appointment with Dr. Donnetta Hutching on 07-30-2015.       @SIGNATURE @

## 2015-07-27 ENCOUNTER — Encounter: Payer: Self-pay | Admitting: Vascular Surgery

## 2015-07-30 ENCOUNTER — Ambulatory Visit (HOSPITAL_COMMUNITY)
Admission: RE | Admit: 2015-07-30 | Discharge: 2015-07-30 | Disposition: A | Discharge status: Home / Self Care | Payer: Medicaid Other | Source: Ambulatory Visit | Attending: Vascular Surgery | Admitting: Vascular Surgery

## 2015-07-30 ENCOUNTER — Ambulatory Visit: Payer: No Typology Code available for payment source | Admitting: Vascular Surgery

## 2015-07-30 DIAGNOSIS — I83893 Varicose veins of bilateral lower extremities with other complications: Secondary | ICD-10-CM | POA: Diagnosis not present

## 2015-07-30 DIAGNOSIS — E1122 Type 2 diabetes mellitus with diabetic chronic kidney disease: Secondary | ICD-10-CM | POA: Insufficient documentation

## 2015-07-30 DIAGNOSIS — Z9889 Other specified postprocedural states: Secondary | ICD-10-CM | POA: Diagnosis present

## 2015-07-30 DIAGNOSIS — I5042 Chronic combined systolic (congestive) and diastolic (congestive) heart failure: Secondary | ICD-10-CM | POA: Diagnosis not present

## 2015-07-31 ENCOUNTER — Encounter: Payer: Self-pay | Admitting: Vascular Surgery

## 2015-08-06 ENCOUNTER — Ambulatory Visit (INDEPENDENT_AMBULATORY_CARE_PROVIDER_SITE_OTHER): Payer: No Typology Code available for payment source | Admitting: Vascular Surgery

## 2015-08-06 ENCOUNTER — Encounter: Payer: Self-pay | Admitting: Vascular Surgery

## 2015-08-06 VITALS — BP 131/62 | HR 64 | Temp 97.4°F | Resp 18 | Ht 68.0 in | Wt 185.0 lb

## 2015-08-06 DIAGNOSIS — I83893 Varicose veins of bilateral lower extremities with other complications: Secondary | ICD-10-CM

## 2015-08-06 NOTE — Progress Notes (Signed)
Vascular and Vein Specialist of [facility]  Patient name: Edgar Brewer MRN: [id number] DOB: Mar 12, 1960 Sex: male  REASON FOR VISIT: Two-week follow-up after laser ablation of left great saphenous vein  HPI: Edgar Brewer is a 55 y.o. male here today for follow-up. He's done very well since his left great saphenous ablation on 07/23/2015. He had the typical amount of discomfort in the ablation site of his vein. Had minimal bruising. Has been compliant with his compression garment  Past Medical History  Diagnosis Date  . Diabetes mellitus without complication ([place])   . Neuropathy ([place])   . Varicose veins of lower extremities with complications 99991111  . Acute on chronic combined systolic and diastolic CHF, NYHA class 1 ([place]) 01/13/2015  . Varicose veins     Family History  Problem Relation Age of Onset  . Cancer Father     SOCIAL HISTORY: Social History  Substance Use Topics  . Smoking status: Former Smoker -- 1.00 packs/day    Types: Cigarettes    Quit date: 01/13/2015  . Smokeless tobacco: Never Used  . Alcohol Use: 0.6 oz/week    1 Glasses of wine per week     Comment: rare occassion    Allergies  Allergen Reactions  . Codeine Hives and Swelling    Swelling all over body     Current Outpatient Prescriptions  Medication Sig Dispense Refill  . carvedilol (COREG) 3.125 MG tablet Take 1 tablet (3.125 mg total) by mouth 2 (two) times daily with a meal. 60 tablet 1  . Docusate Calcium (STOOL SOFTENER PO) Take 1 capsule by mouth 2 (two) times daily. 1 every afternoon and 1 at bedtime.    . furosemide (LASIX) 20 MG tablet Take 20 mg by mouth 2 (two) times daily. Reported on 06/23/2015    . furosemide (LASIX) 40 MG tablet Take 1 tablet (40 mg total) by mouth 2 (two) times daily. 60 tablet 1  . glipiZIDE (GLUCOTROL) 5 MG tablet Take by mouth daily before breakfast.    . hydrALAZINE (APRESOLINE) 50 MG tablet Take 1 tablet (50 mg total) by  mouth every 8 (eight) hours. 90 tablet 1  . ibuprofen (ADVIL,MOTRIN) 200 MG tablet Take 200 mg by mouth at bedtime.    Marland Kitchen losartan (COZAAR) 100 MG tablet Take 100 mg by mouth daily.    . tamsulosin (FLOMAX) 0.4 MG CAPS capsule Take 1 capsule (0.4 mg total) by mouth daily. 30 capsule 1  . vitamin C (ASCORBIC ACID) 500 MG tablet Take 1,000 mg by mouth at bedtime.     Marland Kitchen HYDROcodone-acetaminophen (NORCO/VICODIN) 5-325 MG tablet Take 1 tablet by mouth every 6 (six) hours as needed for moderate pain. (Patient not taking: Reported on 08/06/2015) 30 tablet 0  . insulin glargine (LANTUS) 100 UNIT/ML injection Inject 0.1 mLs (10 Units total) into the skin at bedtime. (Patient not taking: Reported on 08/06/2015) 10 mL 1   No current facility-administered medications for this visit.    REVIEW OF SYSTEMS:  [X]  denotes positive finding, [ ]  denotes negative finding Cardiac  Comments:  Chest pain or chest pressure:    Shortness of breath upon exertion:    Short of breath when lying flat:    Irregular heart rhythm:        Vascular    Pain in calf, thigh, or hip brought on by ambulation:    Pain in feet at night that wakes you up from your sleep:     Blood clot in your veins:  Leg swelling:         Pulmonary    Oxygen at home:    Productive cough:     Wheezing:         Neurologic    Sudden weakness in arms or legs:     Sudden numbness in arms or legs:     Sudden onset of difficulty speaking or slurred speech:    Temporary loss of vision in one eye:     Problems with dizziness:         Gastrointestinal    Blood in stool:     Vomited blood:         Genitourinary    Burning when urinating:     Blood in urine:        Psychiatric    Major depression:         Hematologic    Bleeding problems:    Problems with blood clotting too easily:        Skin    Rashes or ulcers:        Constitutional    Fever or chills:      PHYSICAL EXAM: Filed Vitals:   08/06/15 1012  BP: 131/62  Pulse:  64  Temp: 97.4 F (36.3 C)  Resp: 18  Height: 5\' 8"  (1.727 m)  Weight: 185 lb (83.915 kg)  SpO2: 94%    GENERAL: The patient is a well-nourished male, in no acute distress. The vital signs are documented above. If leg with the easily palpable clotted saphenous vein from the distal insertion site in his proximal calf throughout his thigh. Some tenderness over this. No bruising noted. Severe changes of chronic venous hypertension in both legs with hemosiderin deposits circumferentially from a below his knees to his ankle.  DATA:  Duplex from 07/30/2015 was reviewed with the patient. Shows ablation of left great saphenous vein from the distal insertion site in the proximal calf to 1 cm below the saphenofemoral junction and no DVT  MEDICAL ISSUES: Able overall. We'll continue his compression on an as-needed basis. Does have severe venous hypertension in his right leg. Will notify us when he wishes to proceed with ablation of his right great saphenous vein    [name], MD [facility] Vascular and Vein Specialists of [facility] Tel [phone] Pager [phone]

## 2015-09-04 ENCOUNTER — Other Ambulatory Visit: Payer: Self-pay | Admitting: *Deleted

## 2015-09-04 DIAGNOSIS — I83893 Varicose veins of bilateral lower extremities with other complications: Secondary | ICD-10-CM

## 2015-09-22 ENCOUNTER — Encounter: Payer: Self-pay | Admitting: Vascular Surgery

## 2015-10-01 ENCOUNTER — Other Ambulatory Visit: Payer: No Typology Code available for payment source | Admitting: Vascular Surgery

## 2015-10-08 ENCOUNTER — Ambulatory Visit: Payer: No Typology Code available for payment source | Admitting: Vascular Surgery

## 2015-10-08 ENCOUNTER — Encounter (HOSPITAL_COMMUNITY): Payer: No Typology Code available for payment source

## 2015-10-09 ENCOUNTER — Encounter: Payer: Self-pay | Admitting: Vascular Surgery

## 2015-10-15 ENCOUNTER — Ambulatory Visit (INDEPENDENT_AMBULATORY_CARE_PROVIDER_SITE_OTHER): Payer: No Typology Code available for payment source | Admitting: Vascular Surgery

## 2015-10-15 ENCOUNTER — Encounter: Payer: Self-pay | Admitting: Vascular Surgery

## 2015-10-15 VITALS — BP 126/65 | HR 70 | Temp 97.3°F | Resp 16 | Ht 68.0 in | Wt 185.0 lb

## 2015-10-15 DIAGNOSIS — I83893 Varicose veins of bilateral lower extremities with other complications: Secondary | ICD-10-CM

## 2015-10-15 HISTORY — PX: ENDOVENOUS ABLATION SAPHENOUS VEIN W/ LASER: SUR449

## 2015-10-15 NOTE — Progress Notes (Signed)
     Laser Ablation Procedure    Date: 10/15/2015   Edgar Brewer DOB:05-18-1960  Consent signed: Yes    Surgeon:  Dr. Sherren Mocha Shatarra Wehling  Procedure: Laser Ablation: right Greater Saphenous Vein  BP 126/65 (BP Location: Right Arm, Patient Position: Sitting, Cuff Size: Normal)   Pulse 70   Temp 97.3 F (36.3 C) (Oral)   Resp 16   Ht 5\' 8"  (1.727 m)   Wt 185 lb (83.9 kg)   SpO2 97%   BMI 28.13 kg/m   Tumescent Anesthesia: 450 cc 0.9% NaCl with 50 cc Lidocaine HCL with 1% Epi and 15 cc 8.4% NaHCO3  Local Anesthesia: 4 cc Lidocaine HCL and NaHCO3 (ratio 2:1)  15 watts continuous mode        Total energy: 2492 Joules   Total time: 2:46      Patient tolerated procedure well   Description of Procedure:  After marking the course of the secondary varicosities, the patient was placed on the operating table in the supine position, and the right leg was prepped and draped in sterile fashion.   Local anesthetic was administered and under ultrasound guidance the saphenous vein was accessed with a micro needle and guide wire; then the mirco puncture sheath was placed.  A guide wire was inserted saphenofemoral junction , followed by a 5 french sheath.  The position of the sheath and then the laser fiber below the junction was confirmed using the ultrasound.  Tumescent anesthesia was administered along the course of the saphenous vein using ultrasound guidance. The patient was placed in Trendelenburg position and protective laser glasses were placed on patient and staff, and the laser was fired at 15 watts continuous mode advancing 1-31mm/second for a total of 2492 joules.    Steri strips were applied and ABD pads and thigh high compression stockings were applied.  Ace wrap bandages were applied at the top of the saphenofemoral junction. Blood loss was less than 15 cc.  The patient ambulated out of the operating room having tolerated the procedure well.  Uneventful ablation from mid calf to just  below the saphenofemoral junction

## 2015-10-16 ENCOUNTER — Encounter: Payer: Self-pay | Admitting: Vascular Surgery

## 2015-10-16 ENCOUNTER — Telehealth: Payer: Self-pay | Admitting: *Deleted

## 2015-10-16 NOTE — Telephone Encounter (Signed)
    10/16/2015  Time: 10:38 AM   Patient Name: Edgar Brewer  Patient of: T.F. Early  Procedure:Laser Ablation right greater saphenous vein 10-15-2015  Reached patient at home and checked  His status  Yes    Comments/Actions Taken: Spoke via telephone with [name] mother Edgar Brewer).  States he has some discomfort right inner thigh (area treated) and is taking Ibuprofen as suggested.  Recommended use of ice pack to right leg as needed.  Reviewed all post procedural instructions with Edgar Brewer and reminded her of [place]'s post LA duplex and VV follow up appointment with Dr. Donnetta Hutching on 10-22-2015.      @SIGNATURE @

## 2015-10-20 ENCOUNTER — Encounter: Payer: Self-pay | Admitting: Vascular Surgery

## 2015-10-22 ENCOUNTER — Encounter: Payer: Self-pay | Admitting: Vascular Surgery

## 2015-10-22 ENCOUNTER — Ambulatory Visit (INDEPENDENT_AMBULATORY_CARE_PROVIDER_SITE_OTHER): Payer: No Typology Code available for payment source | Admitting: Vascular Surgery

## 2015-10-22 ENCOUNTER — Ambulatory Visit (HOSPITAL_COMMUNITY)
Admission: RE | Admit: 2015-10-22 | Discharge: 2015-10-22 | Disposition: A | Discharge status: Home / Self Care | Payer: Medicaid Other | Source: Ambulatory Visit | Attending: Vascular Surgery | Admitting: Vascular Surgery

## 2015-10-22 VITALS — BP 155/81 | HR 64 | Temp 97.4°F | Resp 20 | Ht 68.0 in | Wt 184.0 lb

## 2015-10-22 DIAGNOSIS — I83893 Varicose veins of bilateral lower extremities with other complications: Secondary | ICD-10-CM

## 2015-10-22 NOTE — Progress Notes (Signed)
Patient name: Edgar Brewer MRN: [id number] DOB: 11/09/1960 Sex: male  REASON FOR VISIT: One-week follow-up of right great saphenous vein ablation  HPI: Edgar Brewer is a 55 y.o. male day for follow-up. Had no difficulty with his ablation of great saphenous vein on the right. Been compliant with his compression garment  Past Medical History:  Diagnosis Date  . Acute on chronic combined systolic and diastolic CHF, NYHA class 1 ([place]) 01/13/2015  . Diabetes mellitus without complication ([place])   . Neuropathy ([place])   . Varicose veins   . Varicose veins of lower extremities with complications 53/66/4403    Family History  Problem Relation Age of Onset  . Cancer Father     SOCIAL HISTORY: Social History  Substance Use Topics  . Smoking status: Former Smoker    Packs/day: 1.00    Types: Cigarettes    Quit date: 01/13/2015  . Smokeless tobacco: Never Used  . Alcohol use 0.6 oz/week    1 Glasses of wine per week     Comment: rare occassion    Allergies  Allergen Reactions  . Codeine Hives and Swelling    Swelling all over body     Current Outpatient Prescriptions  Medication Sig Dispense Refill  . carvedilol (COREG) 3.125 MG tablet Take 1 tablet (3.125 mg total) by mouth 2 (two) times daily with a meal. 60 tablet 1  . Docusate Calcium (STOOL SOFTENER PO) Take 1 capsule by mouth 2 (two) times daily. 1 every afternoon and 1 at bedtime.    . furosemide (LASIX) 40 MG tablet Take 40 mg by mouth.    Marland Kitchen glipiZIDE (GLUCOTROL) 5 MG tablet Take by mouth daily before breakfast.    . hydrALAZINE (APRESOLINE) 50 MG tablet Take 1 tablet (50 mg total) by mouth every 8 (eight) hours. 90 tablet 1  . ibuprofen (ADVIL,MOTRIN) 200 MG tablet Take 200 mg by mouth at bedtime.    Marland Kitchen losartan (COZAAR) 100 MG tablet Take 100 mg by mouth daily.    . vitamin C (ASCORBIC ACID) 500 MG tablet Take 1,000 mg by mouth at bedtime.      No current facility-administered medications for this visit.     REVIEW  OF SYSTEMS:  [X]  denotes positive finding, [ ]  denotes negative finding Cardiac  Comments:  Chest pain or chest pressure:     Shortness of breath upon exertion:    Short of breath when lying flat:    Irregular heart rhythm:        Vascular    Pain in calf, thigh, or hip brought on by ambulation:    Pain in feet at night that wakes you up from your sleep:     Blood clot in your veins:    Leg swelling:         Pulmonary    Oxygen at home:    Productive cough:     Wheezing:         Neurologic    Sudden weakness in arms or legs:     Sudden numbness in arms or legs:     Sudden onset of difficulty speaking or slurred speech:    Temporary loss of vision in one eye:     Problems with dizziness:         Gastrointestinal    Blood in stool:     Vomited blood:         Genitourinary    Burning when urinating:     Blood in urine:  Psychiatric    Major depression:         Hematologic    Bleeding problems:    Problems with blood clotting too easily:        Skin    Rashes or ulcers:        Constitutional    Fever or chills:      PHYSICAL EXAM: Vitals:   10/22/15 0924 10/22/15 0930  BP: (!) 152/80 (!) 155/81  Pulse: 64   Resp: 20   Temp: 97.4 F (36.3 C)   TempSrc: Oral   SpO2: 96%   Weight: 83.5 kg (184 lb)   Height: 5\' 8"  (1.727 m)     GENERAL: The patient is a well-nourished male, in no acute distress. The vital signs are documented above. Changes of venous stasis disease around his distal calves bilaterally Right vein entry site in the proximal calf with Steri-Strips in place and healing with no evidence of skin irritation Mild tenderness over the mid thigh with no erythema and no bruising   DATA:   Duplex today showed closure of the saphenous vein from proximal calf to 1 cm from the saphenofemoral junction and no evidence of DVT  MEDICAL ISSUES:  Excellent results staged bilateral laser ablation of great saphenous vein. Will wear his compression  garment on the right leg for one additional week and then as needed. Will see [place] again on an as-needed basis    Edgar Brewer Vascular and Vein Specialists of [employer] [phone]

## 2015-11-02 ENCOUNTER — Ambulatory Visit: Payer: Medicaid Other | Attending: Internal Medicine | Admitting: Podiatry

## 2015-11-02 DIAGNOSIS — E1149 Type 2 diabetes mellitus with other diabetic neurological complication: Secondary | ICD-10-CM

## 2015-11-02 NOTE — Patient Instructions (Signed)

## 2015-11-02 NOTE — Progress Notes (Signed)
Subjective:     Patient ID: Edgar Brewer, male   DOB: 08/01/1960, 55 y.o.   MRN: [id number]  HPI Patient presents to the office today for diabetic foot exam. He states that he has some "cold sensation" to his entire body. He has previously been diagnosed neuropathy. He was on medication previously for this but it did not help. He is not sure why he has an appointment today with a foot doctor. He just had vein surgery with Dr. Donnetta Hutching. No open sore, no other complaints. Denies any claudication symptoms.   Review of Systems  All other systems reviewed and are negative.      Objective:   Physical Exam General: AAO x3, NAD  Dermatological: Scabs over the veins from the previous vein surgery. No open sores at this time.   Vascular: Dorsalis Pedis artery and Posterior Tibial artery pedal pulses are 2/4 bilateral with immedate capillary fill time. Varicose veins are present. There is no pain with calf compression, swelling, warmth, erythema.   Neruologic: Grossly intact via light touch bilateral. Vibratory intact via tuning fork bilateral. Protective threshold with Semmes Wienstein monofilament intact to all pedal sites bilateral.   Musculoskeletal: No gross boney pedal deformities bilateral. No pain, crepitus, or limitation noted with foot and ankle range of motion bilateral. Muscular strength 5/5 in all groups tested bilateral.  Gait: Unassisted, Nonantalgic.      Assessment:     Diabetic neuropathy     Plan:     -Chart was reviewed -Discussed daily foot inspection.  -Discussed with him treatment options for neuropathy. He wishes to hold off on any further oral medication. Discussed capsaicin cream. He states he cannot afford the oral medication.  -Follow-up in 3 months or sooner if needed  Celesta Gentile, DPM   *[place]*

## 2016-01-28 ENCOUNTER — Encounter: Payer: Self-pay | Admitting: Gastroenterology

## 2016-02-03 ENCOUNTER — Other Ambulatory Visit (INDEPENDENT_AMBULATORY_CARE_PROVIDER_SITE_OTHER): Payer: Medicaid Other

## 2016-02-03 ENCOUNTER — Ambulatory Visit (INDEPENDENT_AMBULATORY_CARE_PROVIDER_SITE_OTHER): Payer: Medicaid Other | Admitting: Gastroenterology

## 2016-02-03 ENCOUNTER — Encounter: Payer: Self-pay | Admitting: Gastroenterology

## 2016-02-03 VITALS — BP 122/78 | HR 64 | Ht 68.0 in | Wt 199.8 lb

## 2016-02-03 DIAGNOSIS — R1013 Epigastric pain: Secondary | ICD-10-CM

## 2016-02-03 DIAGNOSIS — D509 Iron deficiency anemia, unspecified: Secondary | ICD-10-CM

## 2016-02-03 DIAGNOSIS — R7989 Other specified abnormal findings of blood chemistry: Secondary | ICD-10-CM

## 2016-02-03 DIAGNOSIS — R945 Abnormal results of liver function studies: Secondary | ICD-10-CM

## 2016-02-03 DIAGNOSIS — I509 Heart failure, unspecified: Secondary | ICD-10-CM

## 2016-02-03 LAB — CBC WITH DIFFERENTIAL/PLATELET
BASOS ABS: 0 10*3/uL (ref 0.0–0.1)
Basophils Relative: 0.7 % (ref 0.0–3.0)
EOS ABS: 0.3 10*3/uL (ref 0.0–0.7)
Eosinophils Relative: 5.2 % — ABNORMAL HIGH (ref 0.0–5.0)
HCT: 27.2 % — ABNORMAL LOW (ref 39.0–52.0)
Hemoglobin: 9.3 g/dL — ABNORMAL LOW (ref 13.0–17.0)
LYMPHS ABS: 0.7 10*3/uL (ref 0.7–4.0)
Lymphocytes Relative: 10.1 % — ABNORMAL LOW (ref 12.0–46.0)
MCHC: 34.1 g/dL (ref 30.0–36.0)
MCV: 84.3 fl (ref 78.0–100.0)
MONO ABS: 0.4 10*3/uL (ref 0.1–1.0)
Monocytes Relative: 5.3 % (ref 3.0–12.0)
NEUTROS PCT: 78.7 % — AB (ref 43.0–77.0)
Neutro Abs: 5.2 10*3/uL (ref 1.4–7.7)
Platelets: 197 10*3/uL (ref 150.0–400.0)
RBC: 3.23 Mil/uL — AB (ref 4.22–5.81)
RDW: 14.7 % (ref 11.5–15.5)
WBC: 6.6 10*3/uL (ref 4.0–10.5)

## 2016-02-03 LAB — IBC PANEL
Iron: 47 ug/dL (ref 42–165)
Saturation Ratios: 17.4 % — ABNORMAL LOW (ref 20.0–50.0)
Transferrin: 193 mg/dL — ABNORMAL LOW (ref 212.0–360.0)

## 2016-02-03 LAB — COMPREHENSIVE METABOLIC PANEL
ALK PHOS: 402 U/L — AB (ref 39–117)
ALT: 36 U/L (ref 0–53)
AST: 26 U/L (ref 0–37)
Albumin: 3 g/dL — ABNORMAL LOW (ref 3.5–5.2)
BUN: 51 mg/dL — AB (ref 6–23)
CO2: 25 mEq/L (ref 19–32)
Calcium: 8.4 mg/dL (ref 8.4–10.5)
Chloride: 109 mEq/L (ref 96–112)
Creatinine, Ser: 2.95 mg/dL — ABNORMAL HIGH (ref 0.40–1.50)
GFR: 23.52 mL/min — ABNORMAL LOW (ref >60.00)
GLUCOSE: 121 mg/dL — AB (ref 70–99)
POTASSIUM: 4.3 meq/L (ref 3.5–5.1)
SODIUM: 141 meq/L (ref 135–145)
TOTAL PROTEIN: 5.9 g/dL — AB (ref 6.0–8.3)
Total Bilirubin: 0.4 mg/dL (ref 0.2–1.2)

## 2016-02-03 LAB — FERRITIN: FERRITIN: 161.2 ng/mL (ref 22.0–322.0)

## 2016-02-03 MED ORDER — NA SULFATE-K SULFATE-MG SULF 17.5-3.13-1.6 GM/177ML PO SOLN: 1.0000 | Freq: Once | ORAL | 0 refills | Status: AC

## 2016-02-03 NOTE — Patient Instructions (Signed)
Go to the basement for labs today  You have been scheduled for an abdominal ultrasound at [facility] (1st floor of hospital) on 02/09/2016 at 8:30am. Please arrive 15 minutes prior to your appointment for registration. Make certain not to have anything to eat or drink 6 hours prior to your appointment. Should you need to reschedule your appointment, please contact radiology at [phone]. This test typically takes about 30 minutes to perform.   You will need to follow up with your cardiologist   You have been scheduled for an endoscopy and colonoscopy. Please follow the written instructions given to you at your visit today. Please pick up your prep supplies at the pharmacy within the next 1-3 days. If you use inhalers (even only as needed), please bring them with you on the day of your procedure. Your physician has requested that you go to www.startemmi.com and enter the access code given to you at your visit today. This web site gives a general overview about your procedure. However, you should still follow specific instructions given to you by our office regarding your preparation for the procedure.

## 2016-02-03 NOTE — Progress Notes (Signed)
Edgar Brewer    [id number]    1960/07/18  Primary Care Physician:Eric Wilhelmina Mcardle, MD  Referring Physician: [name] Blocker, MD [address] [place], [place] [address]  Chief complaint:  Iron deficiency anemia  HPI: 56 year old male referred by PMD for new patient evaluation with iron deficiency anemia. He complains of feeling tired and feeling cold for past 3-6 months. He was noted to have anemia with low hemoglobin and low iron sat. Hgb 9.2, HCT 27, RDW 15, BUN 41, Cr 2.37, AST 58, ALT 31, Albumin 3.3, A1C 5.4 Iron 38, TIBC 237 and % sat 16% based on labs from PMD in August 2017. He has history of diabetes with peripheral neuropathy, retinopathy and also chronic kidney disease. Has occasional heartburn, also complained of epigastric pain and tightness intermittently for past few months. Denies any change in bowel habits or blood per rectum. His father died from pancreatic cancer. Maternal grandfather had cancer thinks it was colon cancer but not sure. Maternal uncle died from stomach cancer He also has history of CHF, denies any chest pain or shortness of breath and does not use oxygen at home. Denies chronic NSAID use or alcohol abuse  Outpatient Encounter Prescriptions as of 02/03/2016  Medication Sig  . calcium-vitamin D 250-100 MG-UNIT tablet Take 1 tablet by mouth 2 (two) times daily.  . carvedilol (COREG) 3.125 MG tablet Take 1 tablet (3.125 mg total) by mouth 2 (two) times daily with a meal.  . Docusate Calcium (STOOL SOFTENER PO) Take 1 capsule by mouth 2 (two) times daily. 1 every afternoon and 1 at bedtime.  . ferrous sulfate 325 (65 FE) MG tablet Take 325 mg by mouth daily with breakfast.  . furosemide (LASIX) 40 MG tablet Take 40 mg by mouth.  Marland Kitchen glipiZIDE (GLUCOTROL) 5 MG tablet Take by mouth daily before breakfast.  . hydrALAZINE (APRESOLINE) 50 MG tablet Take 1 tablet (50 mg total) by mouth every 8 (eight) hours.  Marland Kitchen ibuprofen (ADVIL,MOTRIN) 200 MG tablet Take  200 mg by mouth at bedtime.  Marland Kitchen losartan (COZAAR) 100 MG tablet Take 100 mg by mouth daily.  . vitamin C (ASCORBIC ACID) 500 MG tablet Take 1,000 mg by mouth at bedtime.    No facility-administered encounter medications on file as of 02/03/2016.     Allergies as of 02/03/2016 - Review Complete 02/03/2016  Allergen Reaction Noted  . Codeine Hives and Swelling 08/08/2013    Past Medical History:  Diagnosis Date  . Acute on chronic combined systolic and diastolic CHF, NYHA class 1 ([place]) 01/13/2015  . Diabetes mellitus without complication ([place])   . Neuropathy ([place])   . Varicose veins   . Varicose veins of lower extremities with complications 93/73/4287    Past Surgical History:  Procedure Laterality Date  . ENDOVENOUS ABLATION SAPHENOUS VEIN W/ LASER Left 07-23-2015   endovenous laser ablation left greater saphenous vein by [name]  . ENDOVENOUS ABLATION SAPHENOUS VEIN W/ LASER Right 10/15/2015   EVLA right greater saphenous vein by [name]    Family History  Problem Relation Age of Onset  . Cancer Father     Social History   Social History  . Marital status: Legally Separated    Spouse name: N/A  . Number of children: N/A  . Years of education: N/A   Occupational History  . Not on file.   Social History Main Topics  . Smoking status: Former Smoker  Packs/day: 1.00    Types: Cigarettes    Quit date: 01/13/2015  . Smokeless tobacco: Never Used  . Alcohol use 0.6 oz/week    1 Glasses of wine per week     Comment: rare occassion  . Drug use: No  . Sexual activity: Not on file   Other Topics Concern  . Not on file   Social History Narrative  . No narrative on file      Review of systems: Review of Systems  Constitutional: Negative for fever and chills.  HENT: Negative.   Eyes: Legally blind Respiratory: Negative for cough, shortness of breath and wheezing.   Cardiovascular: Negative for chest pain and palpitations.  Gastrointestinal: as  per HPI Genitourinary: Negative for dysuria, urgency, frequency and hematuria.  Musculoskeletal: Negative for myalgias, back pain and joint pain.  Skin: Negative for itching and rash.  Neurological: Negative for dizziness, tremors, focal weakness, seizures and loss of consciousness.  was active for pins and needles sensation in extremities Endo/Heme/Allergies: Positive for seasonal allergies.  Psychiatric/Behavioral: Negative for depression, suicidal ideas and hallucinations.  All other systems reviewed and are negative.   Physical Exam: Vitals:   02/03/16 1359  BP: 122/78  Pulse: 64   Body mass index is 30.38 kg/m. Gen:      No acute distress HEENT:  EOMI, sclera anicteric Neck:     No masses; no thyromegaly Lungs:    Clear to auscultation bilaterally; normal respiratory effort CV:         Regular rate and rhythm; no murmurs Abd:      + bowel sounds; soft, non-tender; no palpable masses, no distension Ext:    2+ edema;  Skin:      Warm and dry; no rash Neuro: alert and oriented x 3 Psych: normal mood and affect  Data Reviewed:  Reviewed labs, radiology imaging, old records and pertinent past GI work up   Assessment and Plan/Recommendations:  56 year old male with history of diabetes complicated by peripheral neuropathy, retinopathy and chronic kidney disease here for evaluation of iron deficiency anemia  Iron deficiency anemia likely multifactorial, anemia of chronic disease but cannot exclude GI blood loss even though fecal Hemoccult was negative Patient never had screening colonoscopy and given his significant upper GI symptoms (epigastric pain), we will plan to proceed with EGD and colonoscopy for evaluation Differential includes malignancy, peptic ulcer disease, esophagitis, gastritis, AVM The risks and benefits as well as alternatives of endoscopic procedure(s) have been discussed and reviewed. All questions answered. The patient agrees to proceed.  Patient had  abnormal elevation of transaminases studies labs in August 2017 We'll obtain labs CBC, BMP, LFT, Ferritin Obtain abdominal ultrasound to evaluate for steatohepatitis /fatty liver /gallstones   Patient also has history of CHF, based on echo in 2016 EF 45-50%, currently on Lasix 40 mg daily advised patient to follow up with cardiology given significant lower extremity edema,may need further adjustment of diuretics  25 minutes was spent face-to-face with the patient. Greater than 50% of the time used for counseling as well as treatment plan and follow-up. She had multiple questions which were answered to her satisfaction  K. Denzil Magnuson , MD [phone] Mon-Fri 8a-5p [phone] after 5p, weekends, holidays  CC: [name] Blocker, MD

## 2016-02-08 ENCOUNTER — Telehealth: Payer: Self-pay | Admitting: Gastroenterology

## 2016-02-09 ENCOUNTER — Ambulatory Visit (HOSPITAL_COMMUNITY)
Admission: RE | Admit: 2016-02-09 | Discharge: 2016-02-09 | Disposition: A | Discharge status: Home / Self Care | Payer: Medicaid Other | Source: Ambulatory Visit | Attending: Gastroenterology | Admitting: Gastroenterology

## 2016-02-09 DIAGNOSIS — D509 Iron deficiency anemia, unspecified: Secondary | ICD-10-CM | POA: Insufficient documentation

## 2016-02-09 DIAGNOSIS — R1013 Epigastric pain: Secondary | ICD-10-CM | POA: Diagnosis not present

## 2016-02-09 DIAGNOSIS — I509 Heart failure, unspecified: Secondary | ICD-10-CM | POA: Diagnosis not present

## 2016-02-09 DIAGNOSIS — D734 Cyst of spleen: Secondary | ICD-10-CM | POA: Diagnosis not present

## 2016-02-09 DIAGNOSIS — K8020 Calculus of gallbladder without cholecystitis without obstruction: Secondary | ICD-10-CM | POA: Diagnosis not present

## 2016-02-09 NOTE — Telephone Encounter (Signed)
He is scheduled for endo/colon on 02/23/16 which is the soonest available. Kidney functions are up. Hgb is same. Let me know what to tell him.

## 2016-02-09 NOTE — Telephone Encounter (Signed)
advised

## 2016-02-09 NOTE — Telephone Encounter (Signed)
He has h/o chronic kidney disease, labs haven't changed much compared to prior done at [place] office in Aug 2017. Hgb is stable. Please inform patient the results. Thanks

## 2016-02-19 ENCOUNTER — Telehealth: Payer: Self-pay | Admitting: Gastroenterology

## 2016-02-19 NOTE — Telephone Encounter (Signed)
ok 

## 2016-02-23 ENCOUNTER — Encounter: Payer: Medicaid Other | Admitting: Gastroenterology

## 2016-02-29 ENCOUNTER — Ambulatory Visit (INDEPENDENT_AMBULATORY_CARE_PROVIDER_SITE_OTHER): Payer: Medicaid Other | Admitting: Nurse Practitioner

## 2016-02-29 ENCOUNTER — Encounter: Payer: Self-pay | Admitting: Nurse Practitioner

## 2016-02-29 VITALS — BP 170/88 | HR 79 | Ht 68.0 in | Wt 185.4 lb

## 2016-02-29 DIAGNOSIS — I83893 Varicose veins of bilateral lower extremities with other complications: Secondary | ICD-10-CM | POA: Diagnosis not present

## 2016-02-29 DIAGNOSIS — M7989 Other specified soft tissue disorders: Secondary | ICD-10-CM | POA: Diagnosis not present

## 2016-02-29 DIAGNOSIS — I872 Venous insufficiency (chronic) (peripheral): Secondary | ICD-10-CM | POA: Diagnosis not present

## 2016-02-29 DIAGNOSIS — I10 Essential (primary) hypertension: Secondary | ICD-10-CM

## 2016-02-29 DIAGNOSIS — I5022 Chronic systolic (congestive) heart failure: Secondary | ICD-10-CM

## 2016-02-29 DIAGNOSIS — N186 End stage renal disease: Secondary | ICD-10-CM

## 2016-02-29 MED ORDER — HYDRALAZINE HCL 25 MG PO TABS: 75.0000 mg | ORAL_TABLET | Freq: Three times a day (TID) | ORAL | 6 refills | Status: DC

## 2016-02-29 MED ORDER — CARVEDILOL 25 MG PO TABS: 25.0000 mg | ORAL_TABLET | Freq: Two times a day (BID) | ORAL | 3 refills | Status: DC

## 2016-02-29 NOTE — Patient Instructions (Addendum)
We will be checking the following labs today - BMET, CBC, pro BNP   Medication Instructions:    Continue with your current medicines. BUT  STOP ADVIL  STOP LOSARTAN  INCREASE HYDRALAZINE to 75 MG THREE TIMES A DAY - this is at your drug store.   INCREASE COREG to 25 MG TWICE A DAY - this is at your drug store.     Testing/Procedures To Be Arranged:  Echocardiogram  Renal duplex - rule out RAS, progressive CKD  Follow-Up:   Referral to nephrology  Needs OV with Dr. [name] to discuss    Other Special Instructions:   Do not take anymore Advil, Motrin, Ibuprofen, etc.   Limit your intake of chips    If you need a refill on your cardiac medications before your next appointment, please call your pharmacy.   Call the [place] office at [phone] if you have any questions, problems or concerns.

## 2016-02-29 NOTE — Progress Notes (Signed)
CARDIOLOGY OFFICE NOTE  Date:  02/29/2016    Edgar Brewer Date of Birth: [birth year] Medical Record #[id number]  PCP:  [name] Blocker, MD  Cardiologist:  [name]  Chief Complaint  Patient presents with  . Edema    Work in visit - seen for Dr. [name]    History of Present Illness: Edgar Brewer is a 56 y.o. male who presents today for a work in visit. Seen for Dr. [name].   He has chronic combined systolic and diastolic HF, DM, neuropathy and varicose veins with prior ablation therapy. Has had prior echo and Myoview testing.   Has not been seen in this office since 2016.   Comes in today. Here with his mother today. He had the flu about 2 weeks ago. History is hard to extract. Was recommended to come here by GI - to have colonoscopy/EGD next month. He is anemic. Stool reportedly negative for blood. Abdominal US with some ascites.  Felt to be losing blood but noted to have CKD as well. He says he only comes here "when I fill up". Not short of breath. His weight is actually down from when he was last seen here. Swelling is chronic. No chest pain. Taking Advil every night.   Past Medical History:  Diagnosis Date  . Acute on chronic combined systolic and diastolic CHF, NYHA class 1 ([place]) 01/13/2015  . Diabetes mellitus without complication ([place])   . Neuropathy ([place])   . Varicose veins   . Varicose veins of lower extremities with complications 64/40/3474    Past Surgical History:  Procedure Laterality Date  . ENDOVENOUS ABLATION SAPHENOUS VEIN W/ LASER Left 07-23-2015   endovenous laser ablation left greater saphenous vein by [name]  . ENDOVENOUS ABLATION SAPHENOUS VEIN W/ LASER Right 10/15/2015   EVLA right greater saphenous vein by [name]     Medications: Current Outpatient Prescriptions  Medication Sig Dispense Refill  . calcium-vitamin D 250-100 MG-UNIT tablet Take 1 tablet by mouth 2 (two) times daily.    Mariane Baumgarten Calcium (STOOL SOFTENER PO) Take 1  capsule by mouth 2 (two) times daily. 1 every afternoon and 1 at bedtime.    . furosemide (LASIX) 40 MG tablet Take 40 mg by mouth 2 (two) times daily. Take 40 mg in the am 20 mg in the pm    . glipiZIDE (GLUCOTROL) 5 MG tablet Take by mouth daily before breakfast.    . ibuprofen (ADVIL,MOTRIN) 200 MG tablet Take 200 mg by mouth at bedtime.    . potassium chloride (KLOR-CON) 20 MEQ packet Take 20 mEq by mouth daily.    . vitamin C (ASCORBIC ACID) 500 MG tablet Take 1,000 mg by mouth at bedtime.     . carvedilol (COREG) 25 MG tablet Take 1 tablet (25 mg total) by mouth 2 (two) times daily. 180 tablet 3  . ferrous sulfate 325 (65 FE) MG tablet Take 325 mg by mouth daily with breakfast.    . hydrALAZINE (APRESOLINE) 25 MG tablet Take 3 tablets (75 mg total) by mouth 3 (three) times daily. 270 tablet 6   No current facility-administered medications for this visit.     Allergies: Allergies  Allergen Reactions  . Codeine Hives and Swelling    Swelling all over body     Social History: The patient  reports that he quit smoking about 13 months ago. His smoking use included Cigarettes. He smoked 1.00 pack per day. He has never used smokeless  tobacco. He reports that he drinks about 0.6 oz of alcohol per week . He reports that he does not use drugs.   Family History: The patient's family history includes Cancer in his father.   Review of Systems: Please see the history of present illness.   Otherwise, the review of systems is positive for none.   All other systems are reviewed and negative.   Physical Exam: VS:  BP (!) 170/88   Pulse 79   Ht 5\' 8"  (1.727 m)   Wt 185 lb 6.4 oz (84.1 kg)   BMI 28.19 kg/m  .  BMI Body mass index is 28.19 kg/m.  Wt Readings from Last 3 Encounters:  02/29/16 185 lb 6.4 oz (84.1 kg)  02/03/16 199 lb 12.8 oz (90.6 kg)  10/22/15 184 lb (83.5 kg)   BP is 180/100 by me.   General: Flat affect. He is alert and in no acute distress. Looks older than his stated  age.   HEENT: Normal.  Neck: Supple, no JVD, carotid bruits, or masses noted.  Cardiac: Regular rate and rhythm. No murmurs, rubs, or gallops. +1 to 2 edema.  Respiratory:  Lungs are clear to auscultation bilaterally with normal work of breathing.  GI: Soft and nontender.  MS: No deformity or atrophy. Gait and ROM intact.  Skin: Warm and dry. Color is quite sallow Neuro:  Strength and sensation are intact and no gross focal deficits noted.  Psych: Alert, appropriate and with normal affect.   LABORATORY DATA:  EKG:  EKG is ordered today. This demonstrates NSR with nonspecific T wave changes.  Lab Results  Component Value Date   WBC 6.6 02/03/2016   HGB 9.3 (L) 02/03/2016   HCT 27.2 (L) 02/03/2016   PLT 197.0 02/03/2016   GLUCOSE 121 (H) 02/03/2016   ALT 36 02/03/2016   AST 26 02/03/2016   NA 141 02/03/2016   K 4.3 02/03/2016   CL 109 02/03/2016   CREATININE 2.95 (H) 02/03/2016   BUN 51 (H) 02/03/2016   CO2 25 02/03/2016   TSH 1.256 01/13/2015   INR 1.08 01/13/2015   HGBA1C 5.7 (H) 01/13/2015    BNP (last 3 results)  Recent Labs  05/18/15 1451  BNP 492.3*    ProBNP (last 3 results) No results for input(s): PROBNP in the last 8760 hours.   Other Studies Reviewed Today:  Myoview Study Highlights from 05/2014    Myocardial perfusion is normal. The study is abormal. This is an intermediate risk study. Overall left ventricular systolic function was abnormal. LV cavity size is normal. The left ventricular ejection fraction is moderately decreased (42%). There is no prior study for comparison.   Notes Recorded by Josue Hector, MD on 06/25/2014 at 8:31 AM Increase cozaar to 100 mg decrease lasix to once/day F/u with me to discuss cath EF low   Echo Study Conclusions from 05/2014  - Left ventricle: The cavity size was mildly dilated. Wall   thickness was increased in a pattern of mild LVH. Systolic   function was mildly reduced. The estimated ejection fraction  was   in the range of 45% to 50%. Diffuse hypokinesis. Doppler   parameters are consistent with elevated ventricular end-diastolic   filling pressure. - Mitral valve: There was mild regurgitation. - Left atrium: The atrium was mildly dilated. - Atrial septum: No defect or patent foramen ovale was identified. - Pulmonary arteries: PA peak pressure: 52 mm Hg (S). - Pericardium, extracardiac: Small to moderate circumferential  pericardial effusion no tamponade.   Assessment/Plan: 1. Swelling - concern for ascites - worsening CKD noted. Needs his echo updated. Stopping Losartan and increasing the hydralazine and Coreg today. Needs labs rechecked. I suspect some of this is due to worsening kidney function.   2. Chronic combined systolic and diastolic HF - needs echo updated. I am assuming that he has been managed medically due to CKD. Would not pursue cardiac cath at this time. Increasing Hydralazine and Coreg today. Stopping ARB due to worsening kidney function.   3. Varicose veins/venous insufficiency - chronic.   4. CKD - unclear as to what has been done - needs nephrology referral. Check renal duplex. Needs to stop his NSAID use as well.   5. Anemia - most likely from CKD.  6. DM - followed by PCP  7. Prior tobacco abuse - tells me he is not smoking  8. Recent flu - resolved.   Current medicines are reviewed with the patient today.  The patient does not have concerns regarding medicines other than what has been noted above.  The following changes have been made:  See above.  Labs/ tests ordered today include:    Orders Placed This Encounter  Procedures  . Basic metabolic panel  . CBC  . Hepatic function panel  . Pro b natriuretic peptide (BNP)  . EKG 12-Lead  . ECHOCARDIOGRAM COMPLETE     Disposition:   FU with Dr. [name] in 3 weeks. Arranging for renal consult, echo updated, renal duplex, and labs today along with med changes.   Patient is agreeable to this plan and  will call if any problems develop in the interim.   SignedTruitt Merle, NP  02/29/2016 2:21 PM  [facility] [address] [place], [place]  [address] Phone: [phone] Fax: [phone]

## 2016-03-01 ENCOUNTER — Ambulatory Visit (HOSPITAL_COMMUNITY)
Admission: RE | Admit: 2016-03-01 | Discharge: 2016-03-01 | Disposition: A | Discharge status: Home / Self Care | Payer: Medicaid Other | Source: Ambulatory Visit | Attending: Internal Medicine | Admitting: Internal Medicine

## 2016-03-01 ENCOUNTER — Telehealth: Payer: Self-pay | Admitting: *Deleted

## 2016-03-01 ENCOUNTER — Other Ambulatory Visit: Payer: Self-pay | Admitting: *Deleted

## 2016-03-01 DIAGNOSIS — Z5189 Encounter for other specified aftercare: Principal | ICD-10-CM

## 2016-03-01 DIAGNOSIS — Z5181 Encounter for therapeutic drug level monitoring: Secondary | ICD-10-CM

## 2016-03-01 DIAGNOSIS — I10 Essential (primary) hypertension: Secondary | ICD-10-CM | POA: Insufficient documentation

## 2016-03-01 LAB — HEPATIC FUNCTION PANEL
ALT: 24 IU/L (ref 0–44)
AST: 29 IU/L (ref 0–40)
Albumin: 3.1 g/dL — ABNORMAL LOW (ref 3.5–5.5)
Alkaline Phosphatase: 347 IU/L — ABNORMAL HIGH (ref 39–117)
Bilirubin Total: 0.2 mg/dL (ref 0.0–1.2)
Bilirubin, Direct: 0.1 mg/dL (ref 0.00–0.40)
Total Protein: 5.2 g/dL — ABNORMAL LOW (ref 6.0–8.5)

## 2016-03-01 LAB — CBC
Hematocrit: 31.2 % — ABNORMAL LOW (ref 37.5–51.0)
Hemoglobin: 10.4 g/dL — ABNORMAL LOW (ref 13.0–17.7)
MCH: 28.2 pg (ref 26.6–33.0)
MCHC: 33.3 g/dL (ref 31.5–35.7)
MCV: 85 fL (ref 79–97)
Platelets: 229 10*3/uL (ref 150–379)
RBC: 3.69 x10E6/uL — ABNORMAL LOW (ref 4.14–5.80)
RDW: 14.9 % (ref 12.3–15.4)
WBC: 9.1 10*3/uL (ref 3.4–10.8)

## 2016-03-01 LAB — BASIC METABOLIC PANEL
BUN/Creatinine Ratio: 16 (ref 9–20)
BUN: 43 mg/dL — ABNORMAL HIGH (ref 6–24)
CO2: 22 mmol/L (ref 18–29)
Calcium: 8.4 mg/dL — ABNORMAL LOW (ref 8.7–10.2)
Chloride: 104 mmol/L (ref 96–106)
Creatinine, Ser: 2.63 mg/dL — ABNORMAL HIGH (ref 0.76–1.27)
GFR calc Af Amer: 30 mL/min/{1.73_m2} — ABNORMAL LOW (ref >59)
GFR calc non Af Amer: 26 mL/min/{1.73_m2} — ABNORMAL LOW (ref >59)
Glucose: 182 mg/dL — ABNORMAL HIGH (ref 65–99)
Potassium: 4.3 mmol/L (ref 3.5–5.2)
Sodium: 143 mmol/L (ref 134–144)

## 2016-03-01 LAB — PRO B NATRIURETIC PEPTIDE: NT-Pro BNP: 7038 pg/mL — ABNORMAL HIGH (ref 0–210)

## 2016-03-01 MED ORDER — FUROSEMIDE 40 MG PO TABS: 40.0000 mg | ORAL_TABLET | Freq: Two times a day (BID) | ORAL | 3 refills | Status: DC

## 2016-03-01 NOTE — Telephone Encounter (Signed)
Pt's mother per DPR is aware office will be doing lab work day of echo. Orders in appointment made and linked.

## 2016-03-04 ENCOUNTER — Other Ambulatory Visit: Payer: Medicaid Other | Admitting: *Deleted

## 2016-03-04 ENCOUNTER — Other Ambulatory Visit: Payer: Self-pay

## 2016-03-04 ENCOUNTER — Ambulatory Visit (HOSPITAL_COMMUNITY): Payer: Medicaid Other | Attending: Internal Medicine

## 2016-03-04 DIAGNOSIS — I272 Pulmonary hypertension, unspecified: Secondary | ICD-10-CM | POA: Insufficient documentation

## 2016-03-04 DIAGNOSIS — I5022 Chronic systolic (congestive) heart failure: Secondary | ICD-10-CM | POA: Insufficient documentation

## 2016-03-04 DIAGNOSIS — I872 Venous insufficiency (chronic) (peripheral): Secondary | ICD-10-CM

## 2016-03-04 DIAGNOSIS — I340 Nonrheumatic mitral (valve) insufficiency: Secondary | ICD-10-CM | POA: Insufficient documentation

## 2016-03-04 DIAGNOSIS — M7989 Other specified soft tissue disorders: Secondary | ICD-10-CM

## 2016-03-04 DIAGNOSIS — I83893 Varicose veins of bilateral lower extremities with other complications: Secondary | ICD-10-CM

## 2016-03-04 DIAGNOSIS — Z5189 Encounter for other specified aftercare: Principal | ICD-10-CM

## 2016-03-04 DIAGNOSIS — Z5181 Encounter for therapeutic drug level monitoring: Secondary | ICD-10-CM

## 2016-03-04 DIAGNOSIS — I313 Pericardial effusion (noninflammatory): Secondary | ICD-10-CM | POA: Insufficient documentation

## 2016-03-05 LAB — BASIC METABOLIC PANEL
BUN/Creatinine Ratio: 16 (ref 9–20)
BUN: 47 mg/dL — ABNORMAL HIGH (ref 6–24)
CO2: 23 mmol/L (ref 18–29)
Calcium: 8.1 mg/dL — ABNORMAL LOW (ref 8.7–10.2)
Chloride: 104 mmol/L (ref 96–106)
Creatinine, Ser: 2.97 mg/dL — ABNORMAL HIGH (ref 0.76–1.27)
GFR calc Af Amer: 26 mL/min/{1.73_m2} — ABNORMAL LOW (ref >59)
GFR calc non Af Amer: 22 mL/min/{1.73_m2} — ABNORMAL LOW (ref >59)
Glucose: 191 mg/dL — ABNORMAL HIGH (ref 65–99)
Potassium: 4.3 mmol/L (ref 3.5–5.2)
Sodium: 140 mmol/L (ref 134–144)

## 2016-03-07 ENCOUNTER — Encounter (HOSPITAL_COMMUNITY): Payer: Medicaid Other

## 2016-03-13 NOTE — Progress Notes (Signed)
CARDIOLOGY OFFICE NOTE  Date:  03/24/2016    Edgar Brewer Date of Birth: [birth year] Medical Record #[id number]  PCP:  [name] Blocker, MD  Cardiologist:  [name]  No chief complaint on file.   History of Present Illness: Edgar Brewer is a 56 y.o. male who presents today for f/u CHF  Seen by PA 02/29/16   He has chronic  diastolic HF, DM, neuropathy and varicose veins with prior ablation therapy.  Had the flu in January .  Was recommended to come here by GI - to have colonoscopy/EGD next month. He is anemic. Stool reportedly negative for blood. Abdominal US with some ascites.  Felt to be losing blood but noted to have CKD as well. He says he only comes here "when I fill up". Not short of breath. His weight is actually down from when he was last seen here. Swelling is chronic. No chest pain. Taking Advil every night.   Echo ordered and showed normal systolic function with grade 2 diastolic dysfunction and estimated PA  41 mmHg  Study Conclusions  - Left ventricle: The cavity size was normal. There was mild   concentric hypertrophy. Systolic function was normal. The   estimated ejection fraction was in the range of 55% to 60%. Wall   motion was normal; there were no regional wall motion   abnormalities. Features are consistent with a pseudonormal left   ventricular filling pattern, with concomitant abnormal relaxation   and increased filling pressure (grade 2 diastolic dysfunction).   Doppler parameters are consistent with high ventricular filling   pressure. - Aortic valve: There was very mild stenosis. - Mitral valve: There was mild regurgitation. - Atrial septum: There was increased thickness of the septum,   consistent with lipomatous hypertrophy. - Pulmonary arteries: PA peak pressure: 41 mm Hg (S). - Pericardium, extracardiac: A small, free-flowing pericardial   effusion was identified circumferential to the heart. The fluid   had no internal echoes.There was no evidence of  hemodynamic   compromise. There was mildright atrial chamber collapse for less   than 50% of the cardiac cycle.  Impressions:  - The right ventricular systolic pressure was increased consistent   with moderate pulmonary hypertension.  Renal duplex showed no RAS  Abdominal US ? Small amount of ascites adjacent to liver  Has had progressive renal failure with Cr 2.97  02/29/16 BNP also elevated 7038    Past Medical History:  Diagnosis Date  . Acute on chronic combined systolic and diastolic CHF, NYHA class 1 ([place]) 01/13/2015  . Diabetes mellitus without complication ([place])   . Neuropathy ([place])   . Varicose veins   . Varicose veins of lower extremities with complications 22/97/9892    Past Surgical History:  Procedure Laterality Date  . ENDOVENOUS ABLATION SAPHENOUS VEIN W/ LASER Left 07-23-2015   endovenous laser ablation left greater saphenous vein by [name]  . ENDOVENOUS ABLATION SAPHENOUS VEIN W/ LASER Right 10/15/2015   EVLA right greater saphenous vein by [name]     Medications: Current Outpatient Prescriptions  Medication Sig Dispense Refill  . calcium-vitamin D 250-100 MG-UNIT tablet Take 1 tablet by mouth 2 (two) times daily.    . carvedilol (COREG) 25 MG tablet Take 1 tablet (25 mg total) by mouth 2 (two) times daily. 180 tablet 3  . ferrous sulfate 325 (65 FE) MG tablet Take 325 mg by mouth daily with breakfast.    . furosemide (LASIX) 40 MG tablet Take  1 tablet (40 mg total) by mouth 2 (two) times daily. 180 tablet 3  . glipiZIDE (GLUCOTROL) 5 MG tablet Take by mouth daily before breakfast.    . hydrALAZINE (APRESOLINE) 25 MG tablet Take 3 tablets (75 mg total) by mouth 3 (three) times daily. 270 tablet 6  . potassium chloride (KLOR-CON) 20 MEQ packet Take 20 mEq by mouth daily.    . vitamin C (ASCORBIC ACID) 500 MG tablet Take 1,000 mg by mouth at bedtime.      No current facility-administered medications for this visit.     Allergies: Allergies    Allergen Reactions  . Codeine Hives and Swelling    Swelling all over body     Social History: The patient  reports that he quit smoking about 14 months ago. His smoking use included Cigarettes. He smoked 1.00 pack per day. He has never used smokeless tobacco. He reports that he drinks about 0.6 oz of alcohol per week . He reports that he does not use drugs.   Family History: The patient's family history includes Cancer in his father.   Review of Systems: Please see the history of present illness.   Otherwise, the review of systems is positive for none.   All other systems are reviewed and negative.   Physical Exam: VS:  BP 132/74   Pulse 70   Ht 5\' 8"  (1.727 m)   Wt 174 lb (78.9 kg)   SpO2 96%   BMI 26.46 kg/m  .  BMI Body mass index is 26.46 kg/m.  Wt Readings from Last 3 Encounters:  03/24/16 174 lb (78.9 kg)  02/29/16 185 lb 6.4 oz (84.1 kg)  02/03/16 199 lb 12.8 oz (90.6 kg)    General: Flat affect. He is alert and in no acute distress. Looks older than his stated age.   HEENT: Normal.  Neck: Supple, no JVD, carotid bruits, or masses noted.  Cardiac: Regular rate and rhythm. MR apical  murmurs, rubs, or gallops. +1 to 2 edema.  Respiratory:  Lungs are clear to auscultation bilaterally with normal work of breathing.  GI: Soft and nontender.  MS: No deformity or atrophy. Gait and ROM intact.  Skin: Warm and dry. Color is quite sallow Neuro:  Strength and sensation are intact and no gross focal deficits noted.  Psych: Alert, appropriate and with normal affect. Marked LE varicosities with some stasis   LABORATORY DATA:  EKG:  EKG is ordered today. This demonstrates NSR with nonspecific T wave changes.  Lab Results  Component Value Date   WBC 9.1 02/29/2016   HGB 9.3 (L) 02/03/2016   HCT 31.2 (L) 02/29/2016   PLT 229 02/29/2016   GLUCOSE 191 (H) 03/04/2016   ALT 24 02/29/2016   AST 29 02/29/2016   NA 140 03/04/2016   K 4.3 03/04/2016   CL 104 03/04/2016    CREATININE 2.97 (H) 03/04/2016   BUN 47 (H) 03/04/2016   CO2 23 03/04/2016   TSH 1.256 01/13/2015   INR 1.08 01/13/2015   HGBA1C 5.7 (H) 01/13/2015    BNP (last 3 results)  Recent Labs  05/18/15 1451  BNP 492.3*    ProBNP (last 3 results)  Recent Labs  02/29/16 1439  PROBNP 7,038*     Other Studies Reviewed Today:  Myoview Study Highlights from 05/2014    Myocardial perfusion is normal. The study is abormal. This is an intermediate risk study. Overall left ventricular systolic function was abnormal. LV cavity size is normal. The left  ventricular ejection fraction is moderately decreased (42%). There is no prior study for comparison.   Notes Recorded by Josue Hector, MD on 06/25/2014 at 8:31 AM Increase cozaar to 100 mg decrease lasix to once/day F/u with me to discuss cath EF low   Echo Study Conclusions from 05/2014  - Left ventricle: The cavity size was mildly dilated. Wall   thickness was increased in a pattern of mild LVH. Systolic   function was mildly reduced. The estimated ejection fraction was   in the range of 45% to 50%. Diffuse hypokinesis. Doppler   parameters are consistent with elevated ventricular end-diastolic   filling pressure. - Mitral valve: There was mild regurgitation. - Left atrium: The atrium was mildly dilated. - Atrial septum: No defect or patent foramen ovale was identified. - Pulmonary arteries: PA peak pressure: 52 mm Hg (S). - Pericardium, extracardiac: Small to moderate circumferential   pericardial effusion no tamponade.   Assessment/Plan: 1. Ascites/GI  To f/u with GI for EGD and colonoscopy  2. Chronic diastolic HF -  Not clear that this is an issue suspect fluid from A/CRF continue lasix  3. Varicose veins/venous insufficiency - chronic. Refer back to Dr Early ? Sclerosing/ablative Rx  4. CKD -   Has seen renal has f/u not clear of w/u Cr around 3 related to anemia and fluid overload  5. Anemia - most likely from  CKD.  6. DM - followed by PCP  7. Prior tobacco abuse - tells me he is not smoking   Current medicines are reviewed with the patient today.  The patient does not have concerns regarding medicines other than what has been noted above.  The following changes have been made:  See above.  Labs/ tests ordered today include:    No orders of the defined types were placed in this encounter.   Jenkins Rouge

## 2016-03-24 ENCOUNTER — Ambulatory Visit (INDEPENDENT_AMBULATORY_CARE_PROVIDER_SITE_OTHER): Payer: Medicaid Other | Admitting: Cardiovascular Disease

## 2016-03-24 ENCOUNTER — Encounter: Payer: Self-pay | Admitting: Cardiovascular Disease

## 2016-03-24 ENCOUNTER — Ambulatory Visit: Payer: Medicaid Other | Admitting: Cardiovascular Disease

## 2016-03-24 VITALS — BP 132/74 | HR 70 | Ht 68.0 in | Wt 174.0 lb

## 2016-03-24 DIAGNOSIS — I5032 Chronic diastolic (congestive) heart failure: Secondary | ICD-10-CM | POA: Diagnosis not present

## 2016-03-24 HISTORY — PX: ESOPHAGOGASTRODUODENOSCOPY: SHX1529

## 2016-03-24 HISTORY — PX: COLONOSCOPY W/ POLYPECTOMY: SHX1380

## 2016-03-24 NOTE — Patient Instructions (Signed)

## 2016-03-31 ENCOUNTER — Other Ambulatory Visit: Payer: Self-pay | Admitting: Vascular Surgery

## 2016-03-31 DIAGNOSIS — N184 Chronic kidney disease, stage 4 (severe): Secondary | ICD-10-CM

## 2016-04-05 ENCOUNTER — Encounter: Payer: Self-pay | Admitting: Gastroenterology

## 2016-04-05 ENCOUNTER — Ambulatory Visit (AMBULATORY_SURGERY_CENTER): Payer: Medicaid Other | Admitting: Gastroenterology

## 2016-04-05 VITALS — BP 184/96 | HR 75 | Temp 96.2°F | Resp 25 | Ht 68.0 in | Wt 199.0 lb

## 2016-04-05 DIAGNOSIS — D123 Benign neoplasm of transverse colon: Secondary | ICD-10-CM

## 2016-04-05 DIAGNOSIS — R1013 Epigastric pain: Secondary | ICD-10-CM

## 2016-04-05 DIAGNOSIS — D122 Benign neoplasm of ascending colon: Secondary | ICD-10-CM

## 2016-04-05 DIAGNOSIS — D120 Benign neoplasm of cecum: Secondary | ICD-10-CM

## 2016-04-05 DIAGNOSIS — D127 Benign neoplasm of rectosigmoid junction: Secondary | ICD-10-CM

## 2016-04-05 DIAGNOSIS — D509 Iron deficiency anemia, unspecified: Secondary | ICD-10-CM

## 2016-04-05 DIAGNOSIS — D124 Benign neoplasm of descending colon: Secondary | ICD-10-CM

## 2016-04-05 MED ORDER — SODIUM CHLORIDE 0.9 % IV SOLN: 500.0000 mL | INTRAVENOUS | Status: DC

## 2016-04-05 NOTE — Progress Notes (Signed)
Report to PACU, RN, vss, BBS= Clear.  

## 2016-04-05 NOTE — Op Note (Signed)
[facility] Patient Name: Edgar Brewer Procedure Date: 04/05/2016 10:56 AM MRN: [id number] Endoscopist: Mauri Pole , MD Age: 56 Referring MD:  Date of Birth: August 04, 1960 Gender: Male Account #: [id number] Procedure:                Upper GI endoscopy Indications:              Suspected upper gastrointestinal bleeding in                            patient with unexplained iron deficiency anemia Medicines:                Monitored Anesthesia Care Procedure:                Pre-Anesthesia Assessment:                           - Prior to the procedure, a History and Physical                            was performed, and patient medications and                            allergies were reviewed. The patient's tolerance of                            previous anesthesia was also reviewed. The risks                            and benefits of the procedure and the sedation                            options and risks were discussed with the patient.                            All questions were answered, and informed consent                            was obtained. Prior Anticoagulants: The patient has                            taken no previous anticoagulant or antiplatelet                            agents. ASA Grade Assessment: III - A patient with                            severe systemic disease. After reviewing the risks                            and benefits, the patient was deemed in                            satisfactory condition to undergo the procedure.  After obtaining informed consent, the endoscope was                            passed under direct vision. Throughout the                            procedure, the patient's blood pressure, pulse, and                            oxygen saturations were monitored continuously. The                            Endoscope was introduced through the mouth, and                            advanced  to the second part of duodenum. The upper                            GI endoscopy was accomplished without difficulty.                            The patient tolerated the procedure well. Scope In: Scope Out: Findings:                 The esophagus was normal.                           The stomach was normal.                           Patchy mildly erythematous mucosa with duodinitis                            without active bleeding and with no stigmata of                            bleeding was found in the duodenal bulb and in the                            second portion of the duodenum. Biopsies for                            histology were taken with a cold forceps for                            evaluation of celiac disease. Complications:            No immediate complications. Estimated Blood Loss:     Estimated blood loss was minimal. Impression:               - Normal esophagus.                           - Normal stomach.                           -  Erythematous duodenopathy. Biopsied. Recommendation:           - Patient has a contact number available for                            emergencies. The signs and symptoms of potential                            delayed complications were discussed with the                            patient. Return to normal activities tomorrow.                            Written discharge instructions were provided to the                            patient.                           - Resume previous diet.                           - Continue present medications.                           - No aspirin, ibuprofen, naproxen, or other                            non-steroidal anti-inflammatory drugs.                           - Await pathology results. Mauri Pole, MD 04/05/2016 11:48:10 AM This report has been signed electronically.

## 2016-04-05 NOTE — Patient Instructions (Signed)
YOU HAD AN ENDOSCOPIC PROCEDURE TODAY AT [facility]:   Refer to the procedure report that was given to you for any specific questions about what was found during the examination.  If the procedure report does not answer your questions, please call your gastroenterologist to clarify.  If you requested that your care partner not be given the details of your procedure findings, then the procedure report has been included in a sealed envelope for you to review at your convenience later.  YOU SHOULD EXPECT: Some feelings of bloating in the abdomen. Passage of more gas than usual.  Walking can help get rid of the air that was put into your GI tract during the procedure and reduce the bloating. If you had a lower endoscopy (such as a colonoscopy or flexible sigmoidoscopy) you may notice spotting of blood in your stool or on the toilet paper. If you underwent a bowel prep for your procedure, you may not have a normal bowel movement for a few days.  Please Note:  You might notice some irritation and congestion in your nose or some drainage.  This is from the oxygen used during your procedure.  There is no need for concern and it should clear up in a day or so.  SYMPTOMS TO REPORT IMMEDIATELY:   Following lower endoscopy (colonoscopy or flexible sigmoidoscopy):  Excessive amounts of blood in the stool  Significant tenderness or worsening of abdominal pains  Swelling of the abdomen that is new, acute  Fever of 100F or higher   Following upper endoscopy (EGD)  Vomiting of blood or coffee ground material  New chest pain or pain under the shoulder blades  Painful or persistently difficult swallowing  New shortness of breath  Fever of 100F or higher  Black, tarry-looking stools  For urgent or emergent issues, a gastroenterologist can be reached at any hour by calling [phone].   DIET:  We do recommend a small meal at first, but then you may proceed to your regular diet.  Drink  plenty of fluids but you should avoid alcoholic beverages for 24 hours.  ACTIVITY:  You should plan to take it easy for the rest of today and you should NOT DRIVE or use heavy machinery until tomorrow (because of the sedation medicines used during the test).    FOLLOW UP: Our staff will call the number listed on your records the next business day following your procedure to check on you and address any questions or concerns that you may have regarding the information given to you following your procedure. If we do not reach you, we will leave a message.  However, if you are feeling well and you are not experiencing any problems, there is no need to return our call.  We will assume that you have returned to your regular daily activities without incident.  If any biopsies were taken you will be contacted by phone or by letter within the next 1-3 weeks.  Please call us at [phone] if you have not heard about the biopsies in 3 weeks.    SIGNATURES/CONFIDENTIALITY: You and/or your care partner have signed paperwork which will be entered into your electronic medical record.  These signatures attest to the fact that that the information above on your After Visit Summary has been reviewed and is understood.  Full responsibility of the confidentiality of this discharge information lies with you and/or your care-partner.  No aspirin, ibuprofen, naproxen, or other non steroidal anti-inflammatory meds.  Polyp and hemorrhoid information given.

## 2016-04-05 NOTE — Op Note (Signed)
[facility] Patient Name: Edgar Brewer Procedure Date: 04/05/2016 10:55 AM MRN: [id number] Endoscopist: Mauri Pole , MD Age: 56 Referring MD:  Date of Birth: [birth year] Gender: Male Account #: [id number] Procedure:                Colonoscopy Indications:              Unexplained iron deficiency anemia Medicines:                Monitored Anesthesia Care Procedure:                Pre-Anesthesia Assessment:                           - Prior to the procedure, a History and Physical                            was performed, and patient medications and                            allergies were reviewed. The patient's tolerance of                            previous anesthesia was also reviewed. The risks                            and benefits of the procedure and the sedation                            options and risks were discussed with the patient.                            All questions were answered, and informed consent                            was obtained. Prior Anticoagulants: The patient has                            taken no previous anticoagulant or antiplatelet                            agents. ASA Grade Assessment: III - A patient with                            severe systemic disease. After reviewing the risks                            and benefits, the patient was deemed in                            satisfactory condition to undergo the procedure.                           After obtaining informed consent, the colonoscope  was passed under direct vision. Throughout the                            procedure, the patient's blood pressure, pulse, and                            oxygen saturations were monitored continuously. The                            Colonoscope was introduced through the anus and                            advanced to the the terminal ileum, with                            identification of the  appendiceal orifice and IC                            valve. The colonoscopy was performed without                            difficulty. The patient tolerated the procedure                            well. The quality of the bowel preparation was                            unsatisfactory. The terminal ileum, ileocecal                            valve, appendiceal orifice, and rectum were                            photographed. The quality of the bowel preparation                            was evaluated using the BBPS [facility] Bowel                            Preparation Scale) with scores of: Right Colon = 2                            (minor amount of residual staining, small fragments                            of stool and/or opaque liquid, but mucosa seen                            well), Transverse Colon = 2 (minor amount of                            residual staining, small fragments of stool and/or  opaque liquid, but mucosa seen well) and Left Colon                            = 2 (minor amount of residual staining, small                            fragments of stool and/or opaque liquid, but mucosa                            seen well). The total BBPS score equals 6. The                            quality of the bowel preparation was fair. Scope In: 11:07:09 AM Scope Out: 11:42:56 AM Scope Withdrawal Time: 0 hours 28 minutes 9 seconds  Total Procedure Duration: 0 hours 35 minutes 47 seconds  Findings:                 The perianal and digital rectal examinations were                            normal.                           Two sessile polyps were found in the appendiceal                            orifice. The polyps were 2 to 3 mm in size. These                            polyps were removed with a cold biopsy forceps.                            Resection and retrieval were complete.                           Three sessile polyps were found in the  cecum. The                            polyps were 7 to 11 mm in size. These polyps were                            removed with a hot snare. Resection and retrieval                            were complete.                           A 20 mm polyp was found in the ascending colon. The                            polyp was sessile. The polyp was removed with a  piecemeal technique using a hot snare. Resection                            and retrieval were complete.                           Four sessile polyps were found in the recto-sigmoid                            colon, descending colon, transverse colon and                            ascending colon. The polyps were 3 to 6 mm in size.                            These polyps were removed with a cold snare.                            Resection and retrieval were complete.                           Multiple small and large-mouthed diverticula were                            found in the sigmoid colon, descending colon,                            transverse colon and ascending colon.                           Non-bleeding internal hemorrhoids were found during                            retroflexion. The hemorrhoids were small. Complications:            No immediate complications. Estimated Blood Loss:     Estimated blood loss was minimal. Impression:               - Preparation of the colon was unsatisfactory.                           - Preparation of the colon was fair.                           - Two 2 to 3 mm polyps at the appendiceal orifice,                            removed with a cold biopsy forceps. Resected and                            retrieved.                           - Three 7 to 11 mm polyps in the cecum, removed  with a hot snare. Resected and retrieved.                           - One 20 mm polyp in the ascending colon, removed                            piecemeal  using a hot snare. Resected and retrieved.                           - Four 3 to 6 mm polyps at the recto-sigmoid colon,                            in the descending colon, in the transverse colon                            and in the ascending colon, removed with a cold                            snare. Resected and retrieved.                           - Diverticulosis in the sigmoid colon, in the                            descending colon, in the transverse colon and in                            the ascending colon.                           - Non-bleeding internal hemorrhoids. Recommendation:           - Patient has a contact number available for                            emergencies. The signs and symptoms of potential                            delayed complications were discussed with the                            patient. Return to normal activities tomorrow.                            Written discharge instructions were provided to the                            patient.                           - Resume previous diet.                           - Continue present medications.                           -  Await pathology results.                           - No aspirin, ibuprofen, naproxen, or other                            non-steroidal anti-inflammatory drugs for 2 weeks.                           - Repeat colonoscopy in 1 year for surveillance                            after piecemeal polypectomy.                           - Return to GI clinic PRN. Mauri Pole, MD 04/05/2016 11:53:24 AM This report has been signed electronically.

## 2016-04-05 NOTE — Progress Notes (Signed)
Called to room to assist during endoscopic procedure.  Patient ID and intended procedure confirmed with present staff. Received instructions for my participation in the procedure from the performing physician.  

## 2016-04-05 NOTE — Progress Notes (Signed)
Pt's states no medical or surgical changes since previsit or office visit. 

## 2016-04-06 ENCOUNTER — Telehealth: Payer: Self-pay | Admitting: *Deleted

## 2016-04-06 NOTE — Telephone Encounter (Signed)
  Follow up Call-  Call back number 04/05/2016  Post procedure Call Back phone  # [phone]  Permission to leave phone message Yes  Some recent data might be hidden     Patient questions:  Do you have a fever, pain , or abdominal swelling? No. Pain Score  0 *  Have you tolerated food without any problems? Yes.    Have you been able to return to your normal activities? Yes.    Do you have any questions about your discharge instructions: Diet   No. Medications  No. Follow up visit  No.  Do you have questions or concerns about your Care? No.  Actions: * If pain score is 4 or above: No action needed, pain <4.

## 2016-04-08 ENCOUNTER — Encounter: Payer: Self-pay | Admitting: Gastroenterology

## 2016-05-17 ENCOUNTER — Encounter: Payer: Self-pay | Admitting: Vascular Surgery

## 2016-05-24 ENCOUNTER — Ambulatory Visit (INDEPENDENT_AMBULATORY_CARE_PROVIDER_SITE_OTHER)
Admission: RE | Admit: 2016-05-24 | Discharge: 2016-05-24 | Disposition: A | Discharge status: Home / Self Care | Payer: BLUE CROSS/BLUE SHIELD | Source: Ambulatory Visit | Attending: Vascular Surgery | Admitting: Vascular Surgery

## 2016-05-24 ENCOUNTER — Encounter: Payer: Self-pay | Admitting: Vascular Surgery

## 2016-05-24 ENCOUNTER — Ambulatory Visit (INDEPENDENT_AMBULATORY_CARE_PROVIDER_SITE_OTHER): Payer: BLUE CROSS/BLUE SHIELD | Admitting: Vascular Surgery

## 2016-05-24 ENCOUNTER — Ambulatory Visit (HOSPITAL_COMMUNITY)
Admission: RE | Admit: 2016-05-24 | Discharge: 2016-05-24 | Disposition: A | Discharge status: Home / Self Care | Payer: BLUE CROSS/BLUE SHIELD | Source: Ambulatory Visit | Attending: Vascular Surgery | Admitting: Vascular Surgery

## 2016-05-24 ENCOUNTER — Other Ambulatory Visit: Payer: Self-pay

## 2016-05-24 VITALS — BP 122/70 | HR 69 | Temp 97.2°F | Resp 16 | Ht 68.0 in | Wt 173.0 lb

## 2016-05-24 DIAGNOSIS — N184 Chronic kidney disease, stage 4 (severe): Secondary | ICD-10-CM | POA: Insufficient documentation

## 2016-05-24 NOTE — Progress Notes (Signed)
[facility] and [facility]in Specialist [facility]  Patient name: Edgar Brewer MRN: [id number] DOB: 04-Apr-1960 Sex: male  REASON FOR VISIT: Discuss hemodialysis access  HPI: Edgar Brewer is a 56 y.o. male well-known to me from prior staged bilateral laser ablation of great saphenous vein for venous hypertension in the past. He is doing quite well with this with no new issues. He is seen today for discussion of hemodialysis access. He is stage IV kidney disease. He has never had any access in the past and has not been on dialysis in the past. He is here today with his mother. As long history of congestive heart failure and also diabetes.  Past Medical History:  Diagnosis Date  . Acute on chronic combined systolic and diastolic CHF, NYHA class 1 ([place]) 01/13/2015  . Diabetes mellitus without complication ([place])   . Neuropathy   . Varicose veins   . Varicose veins of lower extremities with complications 38/10/1749    Family History  Problem Relation Age of Onset  . Cancer Father     SOCIAL HISTORY: Social History  Substance Use Topics  . Smoking status: Former Smoker    Packs/day: 1.00    Types: Cigarettes    Quit date: 01/13/2015  . Smokeless tobacco: Never Used  . Alcohol use 0.6 oz/week    1 Glasses of wine per week     Comment: rare occassion    Allergies  Allergen Reactions  . Codeine Hives and Swelling    Swelling all over body     Current Outpatient Prescriptions  Medication Sig Dispense Refill  . calcium-vitamin D 250-100 MG-UNIT tablet Take 1 tablet by mouth 2 (two) times daily.    . carvedilol (COREG) 25 MG tablet Take 1 tablet (25 mg total) by mouth 2 (two) times daily. 180 tablet 3  . ferrous sulfate 325 (65 FE) MG tablet Take 325 mg by mouth daily with breakfast.    . furosemide (LASIX) 40 MG tablet Take 1 tablet (40 mg total) by mouth 2 (two) times daily. 180 tablet 3  . glipiZIDE (GLUCOTROL) 5 MG tablet Take by mouth daily before  breakfast.    . hydrALAZINE (APRESOLINE) 25 MG tablet Take 3 tablets (75 mg total) by mouth 3 (three) times daily. 270 tablet 6  . vitamin C (ASCORBIC ACID) 500 MG tablet Take 1,000 mg by mouth at bedtime.      Current Facility-Administered Medications  Medication Dose Route Frequency Provider Last Rate Last Dose  . 0.9 %  sodium chloride infusion  500 mL Intravenous Continuous Kavitha Nandigam V, MD        REVIEW OF SYSTEMS:  [X]  denotes positive finding, [ ]  denotes negative finding Cardiac  Comments:  Chest pain or chest pressure:    Shortness of breath upon exertion:    Short of breath when lying flat:    Irregular heart rhythm:        Vascular    Pain in calf, thigh, or hip brought on by ambulation:    Pain in feet at night that wakes you up from your sleep:     Blood clot in your veins:    Leg swelling:  x         PHYSICAL EXAM: Vitals:   05/24/16 0906  BP: 122/70  Pulse: 69  Resp: 16  Temp: 97.2 F (36.2 C)  TempSrc: Oral  SpO2: 97%  Weight: 173 lb (78.5 kg)  Height: 5\' 8"  (1.727 m)    GENERAL: The  patient is a well-nourished male, in no acute distress. The vital signs are documented above. CARDIOVASCULAR: 2+ radial pulses. Large cephalic vein at the left wrist extending to mid forearm. PULMONARY: There is good air exchange  MUSCULOSKELETAL: There are no major deformities or cyanosis. NEUROLOGIC: No focal weakness or paresthesias are detected. SKIN: There are no ulcers or rashes noted. PSYCHIATRIC: The patient has a normal affect.  DATA:  Normal upper extremity arterial studies in our office today.  Vein map shows moderate-sized cephalic vein in the 2-3 mm throughout its course. This is bilaterally.  I images of veins with SonoSite ultrasound as well. He does have a large caliber vein on the left from the wrist to mid forearm and then branches with 2 moderate size veins after this.  MEDICAL ISSUES: Had long discussion with patient and his mother present.  Explained options for hemodialysis catheter, AV fistula and AV graft. I feel that he isn't a candidate for left wrist radiocephalic fistula. We will schedule this at his earliest convenience. Explained that we may ligated to return branches at the same setting. We'll schedule this at his earliest [place]. Diante Barley, MD [facility] Special[facility] of [place] Tel [phone] Pager [phone]

## 2016-05-30 ENCOUNTER — Encounter (HOSPITAL_COMMUNITY): Payer: Self-pay | Admitting: *Deleted

## 2016-05-30 NOTE — Progress Notes (Signed)
I instructed patient to check CBG to check CBG and if it is less than 70 to treat it with  1/2 cup of clear juice like apple juice or cranberry juice. I instructed patient to recheck CBG in 15 minutes and if CBG is not greater than 70, to  Call [phone] (pre- op). If it is before pre-op opens to retreat as before and recheck CBG in 15 minutes. I told patient to make note of time that liquid is taken and amount, that surgical time may have to be adjusted. I instructed patient to hold Glucotrol day of surgery. Patient denies chest pain or shortness of breath at rest.

## 2016-05-31 ENCOUNTER — Encounter (HOSPITAL_COMMUNITY): Payer: Self-pay | Admitting: Vascular Surgery

## 2016-05-31 NOTE — Anesthesia Preprocedure Evaluation (Addendum)
Anesthesia Evaluation  Patient identified by MRN, date of birth, ID band Patient awake    Reviewed: Allergy & Precautions, NPO status , Patient's Chart, lab work & pertinent test results, reviewed documented beta blocker date and time   History of Anesthesia Complications Negative for: history of anesthetic complications  Airway Mallampati: II  TM Distance: >3 FB Neck ROM: Full    Dental  (+) Dental Advisory Given, Chipped   Pulmonary COPD, former smoker (quit 2016),    breath sounds clear to auscultation       Cardiovascular hypertension, Pt. on home beta blockers and Pt. on medications  Rhythm:Regular Rate:Normal  2/18 ECHO: EF 77-41%, grade 2 diastolic dysfunction, mild AS, mild MR, mod pulm HTN   Neuro/Psych negative neurological ROS     GI/Hepatic negative GI ROS, Neg liver ROS,   Endo/Other  diabetes, Oral Hypoglycemic Agents  Renal/GU Renal InsufficiencyRenal disease (K+ 4.3)     Musculoskeletal  (+) Arthritis ,   Abdominal   Peds  Hematology  (+) Blood dyscrasia (Hb 9.5), anemia ,   Anesthesia Other Findings   Reproductive/Obstetrics                            Anesthesia Physical Anesthesia Plan  ASA: III  Anesthesia Plan: MAC   Post-op Pain Management:    Induction:   Airway Management Planned: Natural Airway and Simple Face Mask  Additional Equipment:   Intra-op Plan:   Post-operative Plan:   Informed Consent: I have reviewed the patients History and Physical, chart, labs and discussed the procedure including the risks, benefits and alternatives for the proposed anesthesia with the patient or authorized representative who has indicated his/her understanding and acceptance.   Dental advisory given  Plan Discussed with: CRNA and Surgeon  Anesthesia Plan Comments: (Plan routine monitors, MAC)        Anesthesia Quick Evaluation

## 2016-05-31 NOTE — Progress Notes (Signed)
Anesthesia chart review: SAME DAY WORK-UP.  Patient is a 56 year old male scheduled for left arm arteriovenous fistula creation on 06/01/2016 by Dr. Donnetta Hutching.  History includes CKD (not yet on HD), former smoker (quit 12/2014), combined chronic systolic and diastolic CHF, varicose veins s/p laser ablation, HTN, DM2, GERD, arthritis. He has moderate pulmonary hypertension by echo.   - PCP is listed as Dr. [name].  - Cardiologist is Dr. [name], last visit 03/24/16. He was referred to GI for ascites and anemia. In regards to chronic diastolic CHF, it was suspected at least some of his fluid issues were from CKD. Lasix continued.  - Nephrologist is Dr. Jamal Maes.  Meds include Coreg, Lasix, glipizide, hydralazine.  EKG 02/29/16: NSR, non-specific T wave abnormality.  Echo 03/04/16: Study Conclusions - Left ventricle: The cavity size was normal. There was mild concentric hypertrophy. Systolic function was normal. The estimated ejection fraction was in the range of 55% to 60%. Wall motion was normal; there were no regional wall motion abnormalities. Features are consistent with a pseudonormal left ventricular filling pattern, with concomitant abnormal relaxation and increased filling pressure (grade 2 diastolic dysfunction). Doppler parameters are consistent with high ventricular filling pressure. - Aortic valve: There was very mild stenosis.VTI ratio of LVOT to aortic valve: 0.47. Valve area (VTI): 1.48 cm^2. Indexed valve area (VTI): 0.73 cm^2/m^2. Peak velocity ratio of LVOT to aortic valve: 0.46. Valve area (Vmax): 1.44 cm^2. Indexed valve area (Vmax): 0.71 cm^2/m^2. Mean velocity ratio of LVOT to aortic valve: 0.49. Valve area (Vmean): 1.53 cm^2. Indexed valve area (Vmean): 0.75 cm^2/m^2.    Mean gradient (S): 10 mm Hg. Peak gradient (S): 16 mm Hg. - Mitral valve: There was mild regurgitation. - Atrial septum: There was increased thickness of the septum, consistent with lipomatous hypertrophy. -  Pulmonary arteries: PA peak pressure: 41 mm Hg (S). - Pericardium, extracardiac: A small, free-flowing pericardial effusion was identified circumferential to the heart. The fluid had no internal echoes.There was no evidence of hemodynamic compromise. There was mildright atrial chamber collapse for less than 50% of the cardiac cycle. Impressions: - The right ventricular systolic pressure was increased consistent with moderate pulmonary hypertension. (Report reviewed by Truitt Merle, NP. She wrote, "Ok to report. His echo shows good pumping function. Lots of stiffness of his heart noted. Still has elevated pressure in the lungs. Overall, study not too different from prior. Needs to keep his follow up with Dr. [name] has planned.")  Nuclear stress test 06/24/14:  Myocardial perfusion is normal. The study is abormal. This is an intermediate risk study. Overall left ventricular systolic function was abnormal. LV cavity size is normal. The left ventricular ejection fraction is moderately decreased (42%). There is no prior study for comparison.  He is for labs on arrival. I called and spoke with his briefly. He felt breathing was stable. He denied known personal or family history of anesthesia complications. Further evaluation by his anesthesiologist on the day of surgery to ensure labs acceptable and no acute changes prior to proceeding. Case is posted for MAC.  George Hugh [facility] Short Stay Center/Anesthesiology Phone [phone] 05/31/2016 2:25 PM

## 2016-06-01 ENCOUNTER — Ambulatory Visit (HOSPITAL_COMMUNITY): Payer: BLUE CROSS/BLUE SHIELD | Admitting: Vascular Surgery

## 2016-06-01 ENCOUNTER — Encounter (HOSPITAL_COMMUNITY)
Admission: RE | Disposition: A | Discharge status: Home / Self Care | Payer: Self-pay | Source: Ambulatory Visit | Attending: Vascular Surgery

## 2016-06-01 ENCOUNTER — Encounter (HOSPITAL_COMMUNITY): Payer: Self-pay | Admitting: Certified Registered Nurse Anesthetist

## 2016-06-01 ENCOUNTER — Ambulatory Visit (HOSPITAL_COMMUNITY)
Admission: RE | Admit: 2016-06-01 | Discharge: 2016-06-01 | Disposition: A | Discharge status: Home / Self Care | Payer: BLUE CROSS/BLUE SHIELD | Source: Ambulatory Visit | Attending: Vascular Surgery | Admitting: Vascular Surgery

## 2016-06-01 ENCOUNTER — Telehealth: Payer: Self-pay | Admitting: Vascular Surgery

## 2016-06-01 DIAGNOSIS — I5042 Chronic combined systolic (congestive) and diastolic (congestive) heart failure: Secondary | ICD-10-CM | POA: Diagnosis not present

## 2016-06-01 DIAGNOSIS — Z87891 Personal history of nicotine dependence: Secondary | ICD-10-CM | POA: Diagnosis not present

## 2016-06-01 DIAGNOSIS — I8393 Asymptomatic varicose veins of bilateral lower extremities: Secondary | ICD-10-CM | POA: Insufficient documentation

## 2016-06-01 DIAGNOSIS — M1990 Unspecified osteoarthritis, unspecified site: Secondary | ICD-10-CM | POA: Insufficient documentation

## 2016-06-01 DIAGNOSIS — I130 Hypertensive heart and chronic kidney disease with heart failure and stage 1 through stage 4 chronic kidney disease, or unspecified chronic kidney disease: Secondary | ICD-10-CM | POA: Insufficient documentation

## 2016-06-01 DIAGNOSIS — Z809 Family history of malignant neoplasm, unspecified: Secondary | ICD-10-CM | POA: Diagnosis not present

## 2016-06-01 DIAGNOSIS — Z885 Allergy status to narcotic agent status: Secondary | ICD-10-CM | POA: Insufficient documentation

## 2016-06-01 DIAGNOSIS — Z7984 Long term (current) use of oral hypoglycemic drugs: Secondary | ICD-10-CM | POA: Insufficient documentation

## 2016-06-01 DIAGNOSIS — Z79899 Other long term (current) drug therapy: Secondary | ICD-10-CM | POA: Insufficient documentation

## 2016-06-01 DIAGNOSIS — E1122 Type 2 diabetes mellitus with diabetic chronic kidney disease: Secondary | ICD-10-CM | POA: Insufficient documentation

## 2016-06-01 DIAGNOSIS — J449 Chronic obstructive pulmonary disease, unspecified: Secondary | ICD-10-CM | POA: Insufficient documentation

## 2016-06-01 DIAGNOSIS — N189 Chronic kidney disease, unspecified: Secondary | ICD-10-CM | POA: Insufficient documentation

## 2016-06-01 DIAGNOSIS — E1140 Type 2 diabetes mellitus with diabetic neuropathy, unspecified: Secondary | ICD-10-CM | POA: Diagnosis not present

## 2016-06-01 DIAGNOSIS — N184 Chronic kidney disease, stage 4 (severe): Secondary | ICD-10-CM | POA: Diagnosis not present

## 2016-06-01 HISTORY — DX: Chronic kidney disease, unspecified: N18.9

## 2016-06-01 HISTORY — DX: Pulmonary hypertension, unspecified: I27.20

## 2016-06-01 HISTORY — DX: Essential (primary) hypertension: I10

## 2016-06-01 HISTORY — DX: Unspecified osteoarthritis, unspecified site: M19.90

## 2016-06-01 HISTORY — DX: Gastro-esophageal reflux disease without esophagitis: K21.9

## 2016-06-01 HISTORY — DX: Personal history of urinary calculi: Z87.442

## 2016-06-01 HISTORY — PX: AV FISTULA PLACEMENT: SHX1204

## 2016-06-01 LAB — POCT I-STAT 4, (NA,K, GLUC, HGB,HCT)
Glucose, Bld: 146 mg/dL — ABNORMAL HIGH (ref 65–99)
HCT: 28 % — ABNORMAL LOW (ref 39.0–52.0)
HEMOGLOBIN: 9.5 g/dL — AB (ref 13.0–17.0)
POTASSIUM: 4.3 mmol/L (ref 3.5–5.1)
Sodium: 139 mmol/L (ref 135–145)

## 2016-06-01 SURGERY — ARTERIOVENOUS (AV) FISTULA CREATION
Anesthesia: Monitor Anesthesia Care | Site: Arm Lower | Laterality: Left

## 2016-06-01 MED ORDER — EPHEDRINE 5 MG/ML INJ
INTRAVENOUS | Status: AC
  Filled 2016-06-01: qty 10

## 2016-06-01 MED ORDER — FENTANYL CITRATE (PF) 100 MCG/2ML IJ SOLN
INTRAMUSCULAR | Status: DC | PRN
  Administered 2016-06-01 (×2): 50 ug via INTRAVENOUS

## 2016-06-01 MED ORDER — CHLORHEXIDINE GLUCONATE CLOTH 2 % EX PADS: 6.0000 | MEDICATED_PAD | Freq: Once | CUTANEOUS | Status: DC

## 2016-06-01 MED ORDER — HYDRALAZINE HCL 25 MG PO TABS: 25.0000 mg | ORAL_TABLET | Freq: Three times a day (TID) | ORAL | Status: DC

## 2016-06-01 MED ORDER — 0.9 % SODIUM CHLORIDE (POUR BTL) OPTIME
TOPICAL | Status: DC | PRN
  Administered 2016-06-01: 1000 mL

## 2016-06-01 MED ORDER — LIDOCAINE-EPINEPHRINE (PF) 1 %-1:200000 IJ SOLN
INTRAMUSCULAR | Status: DC | PRN
  Administered 2016-06-01: 8 mL

## 2016-06-01 MED ORDER — FENTANYL CITRATE (PF) 250 MCG/5ML IJ SOLN
INTRAMUSCULAR | Status: AC
  Filled 2016-06-01: qty 5

## 2016-06-01 MED ORDER — MIDAZOLAM HCL 2 MG/2ML IJ SOLN
INTRAMUSCULAR | Status: DC | PRN
  Administered 2016-06-01: 1 mg via INTRAVENOUS

## 2016-06-01 MED ORDER — FENTANYL CITRATE (PF) 100 MCG/2ML IJ SOLN
25.0000 ug | INTRAMUSCULAR | Status: DC | PRN
Start: 2016-06-01 — End: 2016-06-01

## 2016-06-01 MED ORDER — LIDOCAINE 2% (20 MG/ML) 5 ML SYRINGE
INTRAMUSCULAR | Status: DC | PRN
  Administered 2016-06-01: 60 mg via INTRAVENOUS

## 2016-06-01 MED ORDER — PROMETHAZINE HCL 25 MG/ML IJ SOLN: 6.2500 mg | INTRAMUSCULAR | Status: DC | PRN

## 2016-06-01 MED ORDER — ROCURONIUM BROMIDE 10 MG/ML (PF) SYRINGE
PREFILLED_SYRINGE | INTRAVENOUS | Status: AC
  Filled 2016-06-01: qty 5

## 2016-06-01 MED ORDER — PHENYLEPHRINE 40 MCG/ML (10ML) SYRINGE FOR IV PUSH (FOR BLOOD PRESSURE SUPPORT)
PREFILLED_SYRINGE | INTRAVENOUS | Status: AC
  Filled 2016-06-01: qty 10

## 2016-06-01 MED ORDER — MIDAZOLAM HCL 2 MG/2ML IJ SOLN: 0.5000 mg | Freq: Once | INTRAMUSCULAR | Status: DC | PRN

## 2016-06-01 MED ORDER — MIDAZOLAM HCL 2 MG/2ML IJ SOLN
INTRAMUSCULAR | Status: AC
  Filled 2016-06-01: qty 2

## 2016-06-01 MED ORDER — DEXTROSE 5 % IV SOLN
1.5000 g | INTRAVENOUS | Status: AC
  Administered 2016-06-01: 1.5 g via INTRAVENOUS
  Filled 2016-06-01: qty 1.5

## 2016-06-01 MED ORDER — MEPERIDINE HCL 25 MG/ML IJ SOLN: 6.2500 mg | INTRAMUSCULAR | Status: DC | PRN

## 2016-06-01 MED ORDER — TRAMADOL HCL 50 MG PO TABS: 50.0000 mg | ORAL_TABLET | Freq: Four times a day (QID) | ORAL | 0 refills | Status: DC | PRN

## 2016-06-01 MED ORDER — SODIUM CHLORIDE 0.9 % IV SOLN
INTRAVENOUS | Status: DC | PRN
  Administered 2016-06-01: 09:00:00 500 mL

## 2016-06-01 MED ORDER — PROPOFOL 10 MG/ML IV BOLUS
INTRAVENOUS | Status: DC | PRN
  Administered 2016-06-01: 20 mg via INTRAVENOUS

## 2016-06-01 MED ORDER — SUCCINYLCHOLINE CHLORIDE 200 MG/10ML IV SOSY
PREFILLED_SYRINGE | INTRAVENOUS | Status: AC
  Filled 2016-06-01: qty 30

## 2016-06-01 MED ORDER — PROPOFOL 10 MG/ML IV BOLUS
INTRAVENOUS | Status: AC
  Filled 2016-06-01: qty 20

## 2016-06-01 MED ORDER — PROPOFOL 500 MG/50ML IV EMUL
INTRAVENOUS | Status: DC | PRN
Start: 2016-06-01 — End: 2016-06-01
  Administered 2016-06-01: 75 ug/kg/min via INTRAVENOUS

## 2016-06-01 MED ORDER — LIDOCAINE 2% (20 MG/ML) 5 ML SYRINGE
INTRAMUSCULAR | Status: AC
  Filled 2016-06-01: qty 5

## 2016-06-01 MED ORDER — SODIUM CHLORIDE 0.9 % IV SOLN
INTRAVENOUS | Status: DC
  Administered 2016-06-01: 07:00:00 via INTRAVENOUS

## 2016-06-01 MED ORDER — EPHEDRINE SULFATE-NACL 50-0.9 MG/10ML-% IV SOSY
PREFILLED_SYRINGE | INTRAVENOUS | Status: DC | PRN
  Administered 2016-06-01 (×3): 10 mg via INTRAVENOUS

## 2016-06-01 MED ORDER — LIDOCAINE-EPINEPHRINE (PF) 1 %-1:200000 IJ SOLN
INTRAMUSCULAR | Status: AC
  Filled 2016-06-01: qty 30

## 2016-06-01 SURGICAL SUPPLY — 34 items
ADH SKN CLS APL DERMABOND .7 (GAUZE/BANDAGES/DRESSINGS) ×1
ARMBAND PINK RESTRICT EXTREMIT (MISCELLANEOUS) ×4 IMPLANT
CANISTER SUCT 3000ML PPV (MISCELLANEOUS) ×3 IMPLANT
CANNULA VESSEL 3MM 2 BLNT TIP (CANNULA) ×3 IMPLANT
CLIP LIGATING EXTRA MED SLVR (CLIP) ×3 IMPLANT
CLIP LIGATING EXTRA SM BLUE (MISCELLANEOUS) ×3 IMPLANT
COVER PROBE W GEL 5X96 (DRAPES) ×3 IMPLANT
DECANTER SPIKE VIAL GLASS SM (MISCELLANEOUS) ×3 IMPLANT
DERMABOND ADVANCED (GAUZE/BANDAGES/DRESSINGS) ×2
DERMABOND ADVANCED .7 DNX12 (GAUZE/BANDAGES/DRESSINGS) ×1 IMPLANT
ELECT REM PT RETURN 9FT ADLT (ELECTROSURGICAL) ×3
ELECTRODE REM PT RTRN 9FT ADLT (ELECTROSURGICAL) ×1 IMPLANT
GAUZE SPONGE 4X4 12PLY STRL (GAUZE/BANDAGES/DRESSINGS) ×1 IMPLANT
GLOVE SS BIOGEL STRL SZ 7.5 (GLOVE) ×1 IMPLANT
GLOVE SUPERSENSE BIOGEL SZ 7.5 (GLOVE) ×2
GOWN STRL REUS W/ TWL LRG LVL3 (GOWN DISPOSABLE) ×3 IMPLANT
GOWN STRL REUS W/TWL LRG LVL3 (GOWN DISPOSABLE) ×12
KIT BASIN OR (CUSTOM PROCEDURE TRAY) ×3 IMPLANT
KIT ROOM TURNOVER OR (KITS) ×3 IMPLANT
NS IRRIG 1000ML POUR BTL (IV SOLUTION) ×3 IMPLANT
PACK CV ACCESS (CUSTOM PROCEDURE TRAY) ×3 IMPLANT
PAD ARMBOARD 7.5X6 YLW CONV (MISCELLANEOUS) ×6 IMPLANT
SUT PROLENE 6 0 CC (SUTURE) ×3 IMPLANT
SUT SILK 2 0 (SUTURE) ×3
SUT SILK 2-0 18XBRD TIE 12 (SUTURE) IMPLANT
SUT SILK 3 0 (SUTURE) ×3
SUT SILK 3-0 18XBRD TIE 12 (SUTURE) IMPLANT
SUT SILK 4 0 (SUTURE) ×3
SUT SILK 4-0 18XBRD TIE 12 (SUTURE) IMPLANT
SUT VIC AB 3-0 SH 27 (SUTURE) ×6
SUT VIC AB 3-0 SH 27X BRD (SUTURE) ×1 IMPLANT
SUT VICRYL 4-0 PS2 18IN ABS (SUTURE) ×2 IMPLANT
UNDERPAD 30X30 (UNDERPADS AND DIAPERS) ×3 IMPLANT
WATER STERILE IRR 1000ML POUR (IV SOLUTION) ×3 IMPLANT

## 2016-06-01 NOTE — Discharge Instructions (Signed)
° ° °  06/01/2016 Edgar Brewer [id number] [birth year]  Surgeon(s): Early, Arvilla Meres, MD  Procedure(s): Left Radial Cephalic AV Fistula Creation  x Do not stick fistula for 12 weeks

## 2016-06-01 NOTE — Telephone Encounter (Signed)
-----   Message from Mena Goes, RN sent at 06/01/2016 10:05 AM EDT ----- Regarding: 4-5 weeks   ----- Message ----- From: Gabriel Earing, PA-C Sent: 06/01/2016   9:58 AM To: Vvs Charge Pool  s/p left RC AVF 06/01/16.  f/u with Dr. Donnetta Hutching in 4-5 weeks. He does not need a duplex when he comes back.  Thanks, Aldona Bar

## 2016-06-01 NOTE — Telephone Encounter (Signed)
spoke to family, took appt info, Primary school teacher as well for appt 07/12/16

## 2016-06-01 NOTE — Op Note (Signed)
    OPERATIVE REPORT  DATE OF SURGERY: 06/01/2016  PATIENT: Edgar Brewer, 57 y.o. male MRN: [id number]  DOB: 03/26/1960  PRE-OPERATIVE DIAGNOSIS: Chronic renal insufficiency  POST-OPERATIVE DIAGNOSIS:  Same  PROCEDURE: Left radiocephalic AV fistula creation  SURGEON:  Curt Jews, M.D.  PHYSICIAN ASSISTANT: Samantha Rhyne PA-C  ANESTHESIA:  Local with sedation  EBL: Minimal ml  Total I/O In: 300 [I.V.:300] Out: 10 [Blood:10]  BLOOD ADMINISTERED: None  DRAINS: None  SPECIMEN: None  COUNTS CORRECT:  YES  PLAN OF CARE: PACU   PATIENT DISPOSITION:  PACU - hemodynamically stable  PROCEDURE DETAILS: The patient was taken to the operating room placed supine position where the area of the left arm strip Jimmy sterile fashion. SonoSite ultrasound was used to visualize cephalic vein which was of good caliber throughout its course in the forearm. Patient had had several large side branches. Using local anesthesia incision made between the level of the radial artery and the cephalic vein at the wrist. The artery was asked to quite thickened although did have a good pulse. The vein was of good caliber and was ligated distally and divided. The vein was mobilized to the level of the radial artery. The radial artery was occluded proximally and distally and was opened with 11 blade incision longitudinally with Potts scissors. The vein was cut to appropriate length and was spatulated and sewn end-to-side to the artery with a running 6-0 Prolene suture. Clamps removed and good anastomosis was obtained with good hemostasis. There was a nice size of the fistula. SonoSite again was used to identify several large side branches and these were identified and small incision was made using local anesthesia over these in 3 different branches were ligated. The wounds were irrigated with saline. Hemostasis cautery. Wounds were closed with 3-0 Vicryl in the subcutaneous and subcuticular tissue. Sterile dressing  was applied the patient was transferred to the recovery room in stable condition   [name], M.D., [facility] 06/01/2016 5:11 PM

## 2016-06-01 NOTE — Interval H&P Note (Signed)
History and Physical Interval Note:  06/01/2016 7:47 AM  Edgar Brewer  has presented today for surgery, with the diagnosis of Stage IV chronic kidney disease N18.4  The various methods of treatment have been discussed with the patient and family. After consideration of risks, benefits and other options for treatment, the patient has consented to  Procedure(s): ARTERIOVENOUS (AV) FISTULA CREATION (Left) as a surgical intervention .  The patient's history has been reviewed, patient examined, no change in status, stable for surgery.  I have reviewed the patient's chart and labs.  Questions were answered to the patient's satisfaction.     Curt Jews

## 2016-06-01 NOTE — Anesthesia Postprocedure Evaluation (Signed)
Anesthesia Post Note  Patient: Edgar Brewer  Procedure(s) Performed: Procedure(s) (LRB): ARTERIOVENOUS (AV) FISTULA CREATION (Left)  Patient location during evaluation: PACU Anesthesia Type: MAC Level of consciousness: awake and alert, patient cooperative and oriented Pain management: pain level controlled Vital Signs Assessment: post-procedure vital signs reviewed and stable Respiratory status: spontaneous breathing, nonlabored ventilation and respiratory function stable Cardiovascular status: blood pressure returned to baseline and stable Postop Assessment: no signs of nausea or vomiting Anesthetic complications: no       Last Vitals:  Vitals:   06/01/16 1041 06/01/16 1043  BP:  (!) 147/68  Pulse: (!) 59 (!) 59  Resp: 15 16  Temp: 36.1 C     Last Pain: There were no vitals filed for this visit.               Midge Minium

## 2016-06-01 NOTE — Transfer of Care (Signed)
Immediate Anesthesia Transfer of Care Note  Patient: Edgar Brewer  Procedure(s) Performed: Procedure(s): ARTERIOVENOUS (AV) FISTULA CREATION (Left)  Patient Location: PACU  Anesthesia Type:MAC  Level of Consciousness: drowsy and patient cooperative  Airway & Oxygen Therapy: Patient Spontanous Breathing and Patient connected to nasal cannula oxygen  Post-op Assessment: Report given to RN, Post -op Vital signs reviewed and stable and Patient moving all extremities X 4  Post vital signs: Reviewed and stable  Last Vitals:  Vitals:   06/01/16 0705 06/01/16 0955  BP: 130/68   Pulse: 62   Resp: 20   Temp: 36.6 C (P) 36.4 C    Last Pain: There were no vitals filed for this visit.    Patients Stated Pain Goal: 5 (57/84/69 6295)  Complications: No apparent anesthesia complications

## 2016-06-01 NOTE — H&P (View-Only) (Signed)
[facility] and Vein Specialist of [facility]  Patient name: Edgar Brewer MRN: [id number] DOB: [birth year] Sex: male  REASON FOR VISIT: Discuss hemodialysis access  HPI: Edgar Brewer is a 56 y.o. male well-known to me from prior staged bilateral laser ablation of great saphenous vein for venous hypertension in the past. He is doing quite well with this with no new issues. He is seen today for discussion of hemodialysis access. He is stage IV kidney disease. He has never had any access in the past and has not been on dialysis in the past. He is here today with his mother. As long history of congestive heart failure and also diabetes.  Past Medical History:  Diagnosis Date  . Acute on chronic combined systolic and diastolic CHF, NYHA class 1 ([place]) 01/13/2015  . Diabetes mellitus without complication ([place])   . Neuropathy   . Varicose veins   . Varicose veins of lower extremities with complications 94/76/5465    Family History  Problem Relation Age of Onset  . Cancer Father     SOCIAL HISTORY: Social History  Substance Use Topics  . Smoking status: Former Smoker    Packs/day: 1.00    Types: Cigarettes    Quit date: 01/13/2015  . Smokeless tobacco: Never Used  . Alcohol use 0.6 oz/week    1 Glasses of wine per week     Comment: rare occassion    Allergies  Allergen Reactions  . Codeine Hives and Swelling    Swelling all over body     Current Outpatient Prescriptions  Medication Sig Dispense Refill  . calcium-vitamin D 250-100 MG-UNIT tablet Take 1 tablet by mouth 2 (two) times daily.    . carvedilol (COREG) 25 MG tablet Take 1 tablet (25 mg total) by mouth 2 (two) times daily. 180 tablet 3  . ferrous sulfate 325 (65 FE) MG tablet Take 325 mg by mouth daily with breakfast.    . furosemide (LASIX) 40 MG tablet Take 1 tablet (40 mg total) by mouth 2 (two) times daily. 180 tablet 3  . glipiZIDE (GLUCOTROL) 5 MG tablet Take by mouth daily before  breakfast.    . hydrALAZINE (APRESOLINE) 25 MG tablet Take 3 tablets (75 mg total) by mouth 3 (three) times daily. 270 tablet 6  . vitamin C (ASCORBIC ACID) 500 MG tablet Take 1,000 mg by mouth at bedtime.      Current Facility-Administered Medications  Medication Dose Route Frequency Provider Last Rate Last Dose  . 0.9 %  sodium chloride infusion  500 mL Intravenous Continuous Kavitha Nandigam V, MD        REVIEW OF SYSTEMS:  [X]  denotes positive finding, [ ]  denotes negative finding Cardiac  Comments:  Chest pain or chest pressure:    Shortness of breath upon exertion:    Short of breath when lying flat:    Irregular heart rhythm:        Vascular    Pain in calf, thigh, or hip brought on by ambulation:    Pain in feet at night that wakes you up from your sleep:     Blood clot in your veins:    Leg swelling:  x         PHYSICAL EXAM: Vitals:   05/24/16 0906  BP: 122/70  Pulse: 69  Resp: 16  Temp: 97.2 F (36.2 C)  TempSrc: Oral  SpO2: 97%  Weight: 173 lb (78.5 kg)  Height: 5\' 8"  (1.727 m)    GENERAL: The  patient is a well-nourished male, in no acute distress. The vital signs are documented above. CARDIOVASCULAR: 2+ radial pulses. Large cephalic vein at the left wrist extending to mid forearm. PULMONARY: There is good air exchange  MUSCULOSKELETAL: There are no major deformities or cyanosis. NEUROLOGIC: No focal weakness or paresthesias are detected. SKIN: There are no ulcers or rashes noted. PSYCHIATRIC: The patient has a normal affect.  DATA:  Normal upper extremity arterial studies in our office today.  Vein map shows moderate-sized cephalic vein in the 2-3 mm throughout its course. This is bilaterally.  I images of veins with SonoSite ultrasound as well. He does have a large caliber vein on the left from the wrist to mid forearm and then branches with 2 moderate size veins after this.  MEDICAL ISSUES: Had long discussion with patient and his mother present.  Explained options for hemodialysis catheter, AV fistula and AV graft. I feel that he isn't a candidate for left wrist radiocephalic fistula. We will schedule this at his earliest convenience. Explained that we may ligated to return branches at the same setting. We'll schedule this at his earliest [place]. Juma Oxley, MD [facility] Vascular and Vein Specialists of [facility] Tel [phone] Pager [phone]

## 2016-06-02 ENCOUNTER — Encounter (HOSPITAL_COMMUNITY): Payer: Self-pay | Admitting: Vascular Surgery

## 2016-07-04 ENCOUNTER — Encounter: Payer: Self-pay | Admitting: Vascular Surgery

## 2016-07-05 ENCOUNTER — Other Ambulatory Visit: Payer: Self-pay

## 2016-07-05 ENCOUNTER — Emergency Department (HOSPITAL_COMMUNITY): Payer: BLUE CROSS/BLUE SHIELD

## 2016-07-05 ENCOUNTER — Inpatient Hospital Stay (HOSPITAL_COMMUNITY)
Admission: EM | Admit: 2016-07-05 | Discharge: 2016-07-07 | DRG: 292 | Disposition: A | Discharge status: Home / Self Care | Payer: BLUE CROSS/BLUE SHIELD | Attending: Family Medicine | Admitting: Family Medicine

## 2016-07-05 ENCOUNTER — Encounter (HOSPITAL_COMMUNITY): Payer: Self-pay | Admitting: Emergency Medicine

## 2016-07-05 DIAGNOSIS — E1159 Type 2 diabetes mellitus with other circulatory complications: Secondary | ICD-10-CM | POA: Diagnosis present

## 2016-07-05 DIAGNOSIS — N184 Chronic kidney disease, stage 4 (severe): Secondary | ICD-10-CM | POA: Diagnosis present

## 2016-07-05 DIAGNOSIS — Z87891 Personal history of nicotine dependence: Secondary | ICD-10-CM

## 2016-07-05 DIAGNOSIS — R079 Chest pain, unspecified: Secondary | ICD-10-CM | POA: Diagnosis present

## 2016-07-05 DIAGNOSIS — Z79899 Other long term (current) drug therapy: Secondary | ICD-10-CM

## 2016-07-05 DIAGNOSIS — E119 Type 2 diabetes mellitus without complications: Secondary | ICD-10-CM

## 2016-07-05 DIAGNOSIS — I10 Essential (primary) hypertension: Secondary | ICD-10-CM | POA: Diagnosis present

## 2016-07-05 DIAGNOSIS — E1122 Type 2 diabetes mellitus with diabetic chronic kidney disease: Secondary | ICD-10-CM | POA: Diagnosis present

## 2016-07-05 DIAGNOSIS — K219 Gastro-esophageal reflux disease without esophagitis: Secondary | ICD-10-CM | POA: Diagnosis present

## 2016-07-05 DIAGNOSIS — D631 Anemia in chronic kidney disease: Secondary | ICD-10-CM | POA: Diagnosis present

## 2016-07-05 DIAGNOSIS — Z7984 Long term (current) use of oral hypoglycemic drugs: Secondary | ICD-10-CM

## 2016-07-05 DIAGNOSIS — N189 Chronic kidney disease, unspecified: Secondary | ICD-10-CM

## 2016-07-05 DIAGNOSIS — N179 Acute kidney failure, unspecified: Secondary | ICD-10-CM | POA: Diagnosis present

## 2016-07-05 DIAGNOSIS — I272 Pulmonary hypertension, unspecified: Secondary | ICD-10-CM | POA: Diagnosis present

## 2016-07-05 DIAGNOSIS — I130 Hypertensive heart and chronic kidney disease with heart failure and stage 1 through stage 4 chronic kidney disease, or unspecified chronic kidney disease: Principal | ICD-10-CM | POA: Diagnosis present

## 2016-07-05 DIAGNOSIS — E118 Type 2 diabetes mellitus with unspecified complications: Secondary | ICD-10-CM

## 2016-07-05 DIAGNOSIS — T502X5A Adverse effect of carbonic-anhydrase inhibitors, benzothiadiazides and other diuretics, initial encounter: Secondary | ICD-10-CM | POA: Diagnosis present

## 2016-07-05 DIAGNOSIS — I5042 Chronic combined systolic (congestive) and diastolic (congestive) heart failure: Secondary | ICD-10-CM | POA: Diagnosis present

## 2016-07-05 DIAGNOSIS — I5032 Chronic diastolic (congestive) heart failure: Secondary | ICD-10-CM | POA: Diagnosis present

## 2016-07-05 DIAGNOSIS — Z885 Allergy status to narcotic agent status: Secondary | ICD-10-CM

## 2016-07-05 LAB — BASIC METABOLIC PANEL
ANION GAP: 7 (ref 5–15)
BUN: 90 mg/dL — ABNORMAL HIGH (ref 6–20)
CHLORIDE: 111 mmol/L (ref 101–111)
CO2: 21 mmol/L — AB (ref 22–32)
Calcium: 9 mg/dL (ref 8.9–10.3)
Creatinine, Ser: 4.65 mg/dL — ABNORMAL HIGH (ref 0.61–1.24)
GFR calc non Af Amer: 13 mL/min — ABNORMAL LOW (ref >60)
GFR, EST AFRICAN AMERICAN: 15 mL/min — AB (ref >60)
Glucose, Bld: 173 mg/dL — ABNORMAL HIGH (ref 65–99)
Potassium: 4.4 mmol/L (ref 3.5–5.1)
Sodium: 139 mmol/L (ref 135–145)

## 2016-07-05 LAB — CBC
HCT: 26.8 % — ABNORMAL LOW (ref 39.0–52.0)
HEMOGLOBIN: 9.2 g/dL — AB (ref 13.0–17.0)
MCH: 29.6 pg (ref 26.0–34.0)
MCHC: 34.3 g/dL (ref 30.0–36.0)
MCV: 86.2 fL (ref 78.0–100.0)
Platelets: 186 10*3/uL (ref 150–400)
RBC: 3.11 MIL/uL — AB (ref 4.22–5.81)
RDW: 14 % (ref 11.5–15.5)
WBC: 11.1 10*3/uL — AB (ref 4.0–10.5)

## 2016-07-05 LAB — BRAIN NATRIURETIC PEPTIDE: B NATRIURETIC PEPTIDE 5: 167.7 pg/mL — AB (ref 0.0–100.0)

## 2016-07-05 LAB — POCT I-STAT TROPONIN I: TROPONIN I, POC: 0 ng/mL (ref 0.00–0.08)

## 2016-07-05 NOTE — ED Triage Notes (Signed)
Patient c/o sharp central chest pain radiating to back with SOB since carrying heavy equipment yesterday. Reports pain worsens with movement and decreased with tylenol. Ambulatory to triage.

## 2016-07-05 NOTE — ED Provider Notes (Signed)
[facility] DEPT Provider Note   CSN: [id number] Arrival date & time: 07/05/16  1952     History   Chief Complaint Chief Complaint  Patient presents with  . Chest Pain    HPI Edgar Brewer is a 56 y.o. male.  Patient with PMH of CHF, CKD, DM, HTN, pulmonary HTN, presents to the ED with a chief complaint of chest pain and SOB.  He states that his symptoms started this morning at 5:00am.  He reports that his pain radiates to his back and is worsened with deep breathing.  He states that he has taken Tylenol and has had some improvement of his pain.  He denies any fever, cough, or leg swelling.  Denies any other associated symptoms.  His mother states that he has been more fatigued over the past several days.   The history is provided by the patient. No language interpreter was used.    Past Medical History:  Diagnosis Date  . Acute on chronic combined systolic and diastolic CHF, NYHA class 1 ([place]) 01/13/2015  . Arthritis   . Chronic kidney disease    nephrologist- Dr Lorrene Reid  . Diabetes mellitus without complication ([place])    Type II  . GERD (gastroesophageal reflux disease)    at times  . History of kidney stones   . Hypertension   . Neuropathy   . Pulmonary hypertension (HCC)    PA peak pressure 41 mmHg 03/04/16 echo  . Varicose veins   . Varicose veins of lower extremities with complications 40/98/1191    Patient Active Problem List   Diagnosis Date Noted  . Acute on chronic combined systolic and diastolic congestive heart failure ([place])   . Acute renal failure superimposed on stage 3 chronic kidney disease ([place]) 01/14/2015  . 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 Obesity 01/14/2015  . Accelerated hypertension 01/14/2015  . Hypokalemia 01/14/2015  . Anemia of chronic kidney failure 01/14/2015  . Acute on chronic combined systolic and diastolic CHF, NYHA class 1 ([place]) 01/13/2015  . Hematuria 01/13/2015  . Varicose veins of  lower extremities with complications 47/82/9562  . Chronic venous insufficiency 10/22/2014    Past Surgical History:  Procedure Laterality Date  . AV FISTULA PLACEMENT Left 06/01/2016   Procedure: ARTERIOVENOUS (AV) FISTULA CREATION;  Surgeon: [name], MD;  Location: [facility] OR;  Service: Vascular;  Laterality: Left;  . COLONOSCOPY W/ POLYPECTOMY    . ENDOVENOUS ABLATION SAPHENOUS VEIN W/ LASER Left 07-23-2015   endovenous laser ablation left greater saphenous vein by [name]  . ENDOVENOUS ABLATION SAPHENOUS VEIN W/ LASER Right 10/15/2015   EVLA right greater saphenous vein by [name]       Home Medications    Prior to Admission medications   Medication Sig Start Date End Date Taking? Authorizing Provider  calcium-vitamin D 250-100 MG-UNIT tablet Take 1 tablet by mouth 2 (two) times daily.     [provider]  carvedilol (COREG) 25 MG tablet Take 1 tablet (25 mg total) by mouth 2 (two) times daily. 02/29/16 06/01/16  Burtis Junes, NP  cholecalciferol (VITAMIN D) 1000 units tablet Take 1,000 Units by mouth daily.    [provider]  furosemide (LASIX) 40 MG tablet Take 1 tablet (40 mg total) by mouth 2 (two) times daily. 03/01/16 06/01/16  Burtis Junes, NP  glipiZIDE (GLUCOTROL) 5 MG tablet Take 5 mg by mouth daily before breakfast.  [provider]  hydrALAZINE (APRESOLINE) 25 MG tablet Take 1 tablet (25 mg total) by mouth 3 (three) times daily. 06/01/16 08/30/16  Rhyne, Hulen Shouts, PA-C  traMADol (ULTRAM) 50 MG tablet Take 1 tablet (50 mg total) by mouth every 6 (six) hours as needed. 06/01/16   Rhyne, Hulen Shouts, PA-C  vitamin C (ASCORBIC ACID) 500 MG tablet Take 1,000 mg by mouth at bedtime.     [provider]    Family History Family History  Problem Relation Age of Onset  . Cancer Father     Social History Social History  Substance Use Topics  . Smoking status: Former Smoker    Packs/day: 1.00    Years: 45.00    Types:  Cigarettes    Quit date: 01/13/2015  . Smokeless tobacco: Never Used  . Alcohol use 0.6 oz/week    1 Glasses of wine per week     Comment: rare occassion     Allergies   Codeine   Review of Systems Review of Systems  All other systems reviewed and are negative.    Physical Exam Updated Vital Signs BP (!) 125/52 (BP Location: Left Arm)   Pulse 65   Temp 97.8 F (36.6 C) (Oral)   Resp 20   Ht 5\' 8"  (1.727 m)   Wt 79.4 kg (175 lb)   SpO2 97%   BMI 26.61 kg/m   Physical Exam  Constitutional: He is oriented to person, place, and time. He appears well-developed and well-nourished.  HENT:  Head: Normocephalic and atraumatic.  Eyes: Conjunctivae and EOM are normal. Pupils are equal, round, and reactive to light. Right eye exhibits no discharge. Left eye exhibits no discharge. No scleral icterus.  Neck: Normal range of motion. Neck supple. No JVD present.  Cardiovascular: Normal rate, regular rhythm and normal heart sounds.  Exam reveals no gallop and no friction rub.   No murmur heard. Pulmonary/Chest: Effort normal and breath sounds normal. No respiratory distress. He has no wheezes. He has no rales. He exhibits no tenderness.  Abdominal: Soft. He exhibits no distension and no mass. There is no tenderness. There is no rebound and no guarding.  Musculoskeletal: Normal range of motion. He exhibits no edema or tenderness.  Neurological: He is alert and oriented to person, place, and time.  Skin: Skin is warm and dry.  Psychiatric: He has a normal mood and affect. His behavior is normal. Judgment and thought content normal.  Nursing note and vitals reviewed.    ED Treatments / Results  Labs (all labs ordered are listed, but only abnormal results are displayed) Labs Reviewed  CBC - Abnormal; Notable for the following:       Result Value   WBC 11.1 (*)    RBC 3.11 (*)    Hemoglobin 9.2 (*)    HCT 26.8 (*)    All other components within normal limits  BASIC METABOLIC  PANEL  I-STAT TROPOININ, ED  POCT I-STAT TROPONIN I    EKG  EKG Interpretation None       Radiology Dg Chest 2 View  Result Date: 07/05/2016 CLINICAL DATA:  Chest pain that wraps around to right posterior side x 4 hours. HTN and diabetic EXAM: CHEST  2 VIEW COMPARISON:  05/18/2015 FINDINGS: Airway thickening is present, suggesting bronchitis or reactive airways disease. Slight prominence of upper zone pulmonary vasculature but with normal cardiac size. Thoracic spondylosis. No pleural effusion or discrete airspace opacity. IMPRESSION: 1. Airway thickening is present, suggesting bronchitis  or reactive airways disease. 2. Thoracic spondylosis. 3. Mild cephalization of blood flow which could reflect pulmonary venous hypertension, but no overt cardiomegaly at this time. Electronically Signed   By: Van Clines M.D.   On: 07/05/2016 21:22    Procedures Procedures (including critical care time)  Medications Ordered in ED Medications - No data to display   Initial Impression / Assessment and Plan / ED Course  I have reviewed the triage vital signs and the nursing notes.  Pertinent labs & imaging results that were available during my care of the patient were reviewed by me and considered in my medical decision making (see chart for details).     Patient with CP and SOB since this morning at 5:00 am.  Symptoms have been improving throughout the day.  Will check labs, CXR, EKG, and reassess.  Cr is up some, patient not on HD yet, but has a graft placed.  Troponin and BNP are unremarkable.  Patient is higher risk for CAD by risk factors and story.  I discussed the patient with Dr. Regenia Skeeter, who recommends observation in the hospital.  Discussed the patient with Dr. Myna Hidalgo, who will bring patient into the hospital.  Final Clinical Impressions(s) / ED Diagnoses   Final diagnoses:  Chest pain, unspecified type    New Prescriptions New Prescriptions   No medications on file       Montine Circle, PA-C 07/06/16 [place], [place], MD 07/06/16 [phone]

## 2016-07-06 ENCOUNTER — Encounter (HOSPITAL_COMMUNITY): Payer: Self-pay | Admitting: Family Medicine

## 2016-07-06 DIAGNOSIS — E1122 Type 2 diabetes mellitus with diabetic chronic kidney disease: Secondary | ICD-10-CM | POA: Diagnosis present

## 2016-07-06 DIAGNOSIS — E119 Type 2 diabetes mellitus without complications: Secondary | ICD-10-CM

## 2016-07-06 DIAGNOSIS — R079 Chest pain, unspecified: Secondary | ICD-10-CM | POA: Diagnosis present

## 2016-07-06 DIAGNOSIS — I10 Essential (primary) hypertension: Secondary | ICD-10-CM | POA: Diagnosis not present

## 2016-07-06 DIAGNOSIS — N179 Acute kidney failure, unspecified: Secondary | ICD-10-CM | POA: Diagnosis present

## 2016-07-06 DIAGNOSIS — I5042 Chronic combined systolic (congestive) and diastolic (congestive) heart failure: Secondary | ICD-10-CM | POA: Diagnosis present

## 2016-07-06 DIAGNOSIS — T502X5A Adverse effect of carbonic-anhydrase inhibitors, benzothiadiazides and other diuretics, initial encounter: Secondary | ICD-10-CM | POA: Diagnosis present

## 2016-07-06 DIAGNOSIS — D631 Anemia in chronic kidney disease: Secondary | ICD-10-CM | POA: Diagnosis present

## 2016-07-06 DIAGNOSIS — N184 Chronic kidney disease, stage 4 (severe): Secondary | ICD-10-CM | POA: Diagnosis present

## 2016-07-06 DIAGNOSIS — Z87891 Personal history of nicotine dependence: Secondary | ICD-10-CM | POA: Diagnosis not present

## 2016-07-06 DIAGNOSIS — I272 Pulmonary hypertension, unspecified: Secondary | ICD-10-CM | POA: Diagnosis present

## 2016-07-06 DIAGNOSIS — I5032 Chronic diastolic (congestive) heart failure: Secondary | ICD-10-CM | POA: Diagnosis not present

## 2016-07-06 DIAGNOSIS — K219 Gastro-esophageal reflux disease without esophagitis: Secondary | ICD-10-CM | POA: Diagnosis present

## 2016-07-06 DIAGNOSIS — Z885 Allergy status to narcotic agent status: Secondary | ICD-10-CM | POA: Diagnosis not present

## 2016-07-06 DIAGNOSIS — I130 Hypertensive heart and chronic kidney disease with heart failure and stage 1 through stage 4 chronic kidney disease, or unspecified chronic kidney disease: Secondary | ICD-10-CM | POA: Diagnosis present

## 2016-07-06 DIAGNOSIS — Z7984 Long term (current) use of oral hypoglycemic drugs: Secondary | ICD-10-CM | POA: Diagnosis not present

## 2016-07-06 DIAGNOSIS — Z79899 Other long term (current) drug therapy: Secondary | ICD-10-CM | POA: Diagnosis not present

## 2016-07-06 LAB — CBC
HEMATOCRIT: 26.6 % — AB (ref 39.0–52.0)
HEMOGLOBIN: 9.2 g/dL — AB (ref 13.0–17.0)
MCH: 29.2 pg (ref 26.0–34.0)
MCHC: 34.6 g/dL (ref 30.0–36.0)
MCV: 84.4 fL (ref 78.0–100.0)
Platelets: 185 10*3/uL (ref 150–400)
RBC: 3.15 MIL/uL — AB (ref 4.22–5.81)
RDW: 13.8 % (ref 11.5–15.5)
WBC: 9.5 10*3/uL (ref 4.0–10.5)

## 2016-07-06 LAB — GLUCOSE, CAPILLARY
GLUCOSE-CAPILLARY: 195 mg/dL — AB (ref 65–99)
GLUCOSE-CAPILLARY: 217 mg/dL — AB (ref 65–99)
Glucose-Capillary: 172 mg/dL — ABNORMAL HIGH (ref 65–99)
Glucose-Capillary: 108 mg/dL — ABNORMAL HIGH (ref 65–99)
Glucose-Capillary: 139 mg/dL — ABNORMAL HIGH (ref 65–99)

## 2016-07-06 LAB — BASIC METABOLIC PANEL
Anion gap: 10 (ref 5–15)
BUN: 89 mg/dL — ABNORMAL HIGH (ref 6–20)
CALCIUM: 9 mg/dL (ref 8.9–10.3)
CHLORIDE: 110 mmol/L (ref 101–111)
CO2: 22 mmol/L (ref 22–32)
Creatinine, Ser: 4.27 mg/dL — ABNORMAL HIGH (ref 0.61–1.24)
GFR calc Af Amer: 16 mL/min — ABNORMAL LOW (ref >60)
GFR calc non Af Amer: 14 mL/min — ABNORMAL LOW (ref >60)
GLUCOSE: 128 mg/dL — AB (ref 65–99)
Potassium: 4 mmol/L (ref 3.5–5.1)
Sodium: 142 mmol/L (ref 135–145)

## 2016-07-06 LAB — TROPONIN I: Troponin I: <0.03 ng/mL (ref <0.03)

## 2016-07-06 LAB — HIV ANTIBODY (ROUTINE TESTING W REFLEX): HIV Screen 4th Generation wRfx: NONREACTIVE

## 2016-07-06 LAB — CREATININE, URINE, RANDOM: Creatinine, Urine: 93.42 mg/dL

## 2016-07-06 LAB — SODIUM, URINE, RANDOM: SODIUM UR: 69 mmol/L

## 2016-07-06 MED ORDER — ASPIRIN EC 81 MG PO TBEC
81.0000 mg | DELAYED_RELEASE_TABLET | Freq: Every day | ORAL | Status: DC
  Administered 2016-07-07: 81 mg via ORAL
  Filled 2016-07-06: qty 1

## 2016-07-06 MED ORDER — SODIUM CHLORIDE 0.9 % IV SOLN
INTRAVENOUS | Status: DC
  Administered 2016-07-06 – 2016-07-07 (×2): via INTRAVENOUS

## 2016-07-06 MED ORDER — VITAMIN C 500 MG PO TABS
1000.0000 mg | ORAL_TABLET | Freq: Every day | ORAL | Status: DC
  Administered 2016-07-06: 1000 mg via ORAL
  Filled 2016-07-06 (×2): qty 2

## 2016-07-06 MED ORDER — HEPARIN SODIUM (PORCINE) 5000 UNIT/ML IJ SOLN
5000.0000 [IU] | Freq: Three times a day (TID) | INTRAMUSCULAR | Status: DC
  Administered 2016-07-06 – 2016-07-07 (×4): 5000 [IU] via SUBCUTANEOUS
  Filled 2016-07-06 (×3): qty 1

## 2016-07-06 MED ORDER — INSULIN ASPART 100 UNIT/ML ~~LOC~~ SOLN
0.0000 [IU] | Freq: Every day | SUBCUTANEOUS | Status: DC
  Administered 2016-07-06: 2 [IU] via SUBCUTANEOUS

## 2016-07-06 MED ORDER — CARVEDILOL 25 MG PO TABS
25.0000 mg | ORAL_TABLET | Freq: Two times a day (BID) | ORAL | Status: DC
  Administered 2016-07-06 – 2016-07-07 (×3): 25 mg via ORAL
  Filled 2016-07-06 (×3): qty 1

## 2016-07-06 MED ORDER — FENTANYL CITRATE (PF) 100 MCG/2ML IJ SOLN: 25.0000 ug | INTRAMUSCULAR | Status: DC | PRN

## 2016-07-06 MED ORDER — ASPIRIN 81 MG PO CHEW
324.0000 mg | CHEWABLE_TABLET | Freq: Once | ORAL | Status: AC
  Administered 2016-07-06: 324 mg via ORAL
  Filled 2016-07-06: qty 4

## 2016-07-06 MED ORDER — NITROGLYCERIN 0.4 MG SL SUBL: 0.4000 mg | SUBLINGUAL_TABLET | SUBLINGUAL | Status: DC | PRN

## 2016-07-06 MED ORDER — ONDANSETRON HCL 4 MG/2ML IJ SOLN: 4.0000 mg | Freq: Four times a day (QID) | INTRAMUSCULAR | Status: DC | PRN

## 2016-07-06 MED ORDER — INSULIN ASPART 100 UNIT/ML ~~LOC~~ SOLN
0.0000 [IU] | Freq: Three times a day (TID) | SUBCUTANEOUS | Status: DC
  Administered 2016-07-06: 1 [IU] via SUBCUTANEOUS
  Administered 2016-07-06: 2 [IU] via SUBCUTANEOUS
  Administered 2016-07-07: 1 [IU] via SUBCUTANEOUS

## 2016-07-06 MED ORDER — HYDRALAZINE HCL 25 MG PO TABS
25.0000 mg | ORAL_TABLET | Freq: Three times a day (TID) | ORAL | Status: DC
  Administered 2016-07-06 – 2016-07-07 (×4): 25 mg via ORAL
  Filled 2016-07-06 (×4): qty 1

## 2016-07-06 MED ORDER — VITAMIN D3 25 MCG (1000 UNIT) PO TABS
1000.0000 [IU] | ORAL_TABLET | Freq: Every day | ORAL | Status: DC
  Administered 2016-07-06 – 2016-07-07 (×2): 1000 [IU] via ORAL
  Filled 2016-07-06 (×2): qty 1

## 2016-07-06 MED ORDER — ACETAMINOPHEN 325 MG PO TABS: 650.0000 mg | ORAL_TABLET | ORAL | Status: DC | PRN

## 2016-07-06 NOTE — Progress Notes (Signed)
PROGRESS NOTE  Edgar Brewer ASN:[id number] DOB: [birth year] DOA: 07/05/2016 PCP: [name] Blocker, MD  HPI/Recap of past 16 hours: 56 year old male with past medical hx of type 2 DM, HTN, chronic diastolic CHF & stg IV CKD STATUS post recent fistula placement resentment to the emergency room on the evening of 6/12 with complaints of chest pain. Patient described as sharp, 10/10 radiating from just left of midsternal old way across the right side and then into the back. Initially last for several hours that morning then eased off but then returned. No associated shortness of breath. This is a new symptom for the patient. The patient came in initial EKG and troponins were normal. However, his creatinine was elevated at 4.65 with a BUN of 90. His last creatinine was 2.97 active February. Patient still making urine. Hemoglobin was unchanged when compared to previous. Patient on diuretics which were held and brought into the hospitalist service.  This morning, creatinine slightly better coming down to 4.27. Patient's chest pain which resolved in the emergency room is not recurred. Troponins have been negative to date. Otherwise he has no complaints.  Assessment/Plan: Principal Problem:   Acute kidney injury in the setting of stage IV chronic kidney disease: Likely caused by over diuresis and perhaps decreased by mouth intake. Holding diuretics and have started IV fluids. Keeping at 75 cc an hour so that he does not get volume overloaded. Recheck creatinine in the morning.  Chest pain: Resolved. Atypical. Doubt for much cardiac likely musculoskeletal Active Problems:   Chronic diastolic CHF (congestive heart failure) [facility]): Patient right now dry. Diuretics on hold. See above.   CKD (chronic kidney disease), stage IV (HCC)   Diabetes mellitus type II, non insulin dependent ([place]): CBG stable, under 150   Accelerated hypertension: Blood pressure stable   Anemia of chronic kidney failure: Chronic and  stable  Code Status: Full code   Family Communication: Mother at the bedside   Disposition Plan: Anticipate discharge tomorrow as long as creatinine much improved    Consultants:  Notified nephrology, they will follow as outpatient   Procedures:  None   Antimicrobials:  None   DVT prophylaxis:  Lovenox   Objective: Vitals:   07/06/16 0300 07/06/16 0324 07/06/16 0934 07/06/16 1528  BP: 137/62 (!) 162/67 (!) 131/53 (!) 150/58  Pulse: 66 76  83  Resp: (!) 38 (!) 25  16  Temp:  98.2 F (36.8 C)  98.9 F (37.2 C)  TempSrc:  Oral  Oral  SpO2: 92% 96%  93%  Weight:  76.4 kg (168 lb 6.9 oz)    Height:  5\' 8"  (1.727 m)      Intake/Output Summary (Last 24 hours) at 07/06/16 1538 Last data filed at 07/06/16 1534  Gross per 24 hour  Intake              670 ml  Output              250 ml  Net              420 ml   Filed Weights   07/05/16 2046 07/06/16 0324  Weight: 79.4 kg (175 lb) 76.4 kg (168 lb 6.9 oz)    Exam:   General:  Alert and oriented 3, no acute distress  HEENT: Normocephalic, atraumatic, mucous membranes are dry  Cardiovascular: Regular rate and rhythm, S1-S2   Respiratory: Clear to auscultation bilaterally, no labored breathing   Abdomen: Soft, nontender, nondistended, positive bowel sounds  Musculoskeletal: Chest pain not reproducible with palpation, no clubbing or cyanosis or edema   Skin: No skin breaks, tears or lesions  Psychiatry: Patient is appropriate, no evidence of psychoses    Data Reviewed: CBC:  Recent Labs Lab 07/05/16 2157 07/06/16 0539  WBC 11.1* 9.5  HGB 9.2* 9.2*  HCT 26.8* 26.6*  MCV 86.2 84.4  PLT 186 161   Basic Metabolic Panel:  Recent Labs Lab 07/05/16 2157 07/06/16 0539  NA 139 142  K 4.4 4.0  CL 111 110  CO2 21* 22  GLUCOSE 173* 128*  BUN 90* 89*  CREATININE 4.65* 4.27*  CALCIUM 9.0 9.0   GFR: Estimated Creatinine Clearance: 18.5 mL/min (A) (by C-G formula based on SCr of 4.27 mg/dL  (H)). Liver Function Tests: No results for input(s): AST, ALT, ALKPHOS, BILITOT, PROT, ALBUMIN in the last 168 hours. No results for input(s): LIPASE, AMYLASE in the last 168 hours. No results for input(s): AMMONIA in the last 168 hours. Coagulation Profile: No results for input(s): INR, PROTIME in the last 168 hours. Cardiac Enzymes:  Recent Labs Lab 07/06/16 0111 07/06/16 0539 07/06/16 1249  TROPONINI <0.03 <0.03 <0.03   BNP (last 3 results)  Recent Labs  02/29/16 1439  PROBNP 7,038*   HbA1C: No results for input(s): HGBA1C in the last 72 hours. CBG:  Recent Labs Lab 07/06/16 0333 07/06/16 0731 07/06/16 1200  GLUCAP 172* 108* 139*   Lipid Profile: No results for input(s): CHOL, HDL, LDLCALC, TRIG, CHOLHDL, LDLDIRECT in the last 72 hours. Thyroid Function Tests: No results for input(s): TSH, T4TOTAL, FREET4, T3FREE, THYROIDAB in the last 72 hours. Anemia Panel: No results for input(s): VITAMINB12, FOLATE, FERRITIN, TIBC, IRON, RETICCTPCT in the last 72 hours. Urine analysis:    Component Value Date/Time   COLORURINE YELLOW 01/13/2015 0058   APPEARANCEUR CLEAR 01/13/2015 0058   LABSPEC 1.021 01/13/2015 0058   PHURINE 6.5 01/13/2015 0058   GLUCOSEU NEGATIVE 01/13/2015 0058   HGBUR LARGE (A) 01/13/2015 0058   BILIRUBINUR NEGATIVE 01/13/2015 0058   KETONESUR NEGATIVE 01/13/2015 0058   PROTEINUR >300 (A) 01/13/2015 0058   NITRITE NEGATIVE 01/13/2015 0058   LEUKOCYTESUR NEGATIVE 01/13/2015 0058   Sepsis Labs: @LABRCNTIP (procalcitonin:4,lacticidven:4)  )No results found for this or any previous visit (from the past 240 hour(s)).    Studies: Dg Chest 2 View  Result Date: 07/05/2016 CLINICAL DATA:  Chest pain that wraps around to right posterior side x 4 hours. HTN and diabetic EXAM: CHEST  2 VIEW COMPARISON:  05/18/2015 FINDINGS: Airway thickening is present, suggesting bronchitis or reactive airways disease. Slight prominence of upper zone pulmonary  vasculature but with normal cardiac size. Thoracic spondylosis. No pleural effusion or discrete airspace opacity. IMPRESSION: 1. Airway thickening is present, suggesting bronchitis or reactive airways disease. 2. Thoracic spondylosis. 3. Mild cephalization of blood flow which could reflect pulmonary venous hypertension, but no overt cardiomegaly at this time. Electronically Signed   By: Van Clines M.D.   On: 07/05/2016 21:22    Scheduled Meds: . [START ON 07/07/2016] aspirin EC  81 mg Oral Daily  . carvedilol  25 mg Oral BID WC  . cholecalciferol  1,000 Units Oral Daily  . heparin  5,000 Units Subcutaneous Q8H  . hydrALAZINE  25 mg Oral TID  . insulin aspart  0-5 Units Subcutaneous QHS  . insulin aspart  0-9 Units Subcutaneous TID WC  . vitamin C  1,000 mg Oral QHS    Continuous Infusions: . sodium chloride 75 mL/hr at 07/06/16  1052     LOS: 0 days     Annita Brod, MD [facility] Pager [phone]  If 7PM-7AM, please contact night-coverage [email or web address] Password [facility] 07/06/2016, 3:38 PM

## 2016-07-06 NOTE — H&P (Signed)
History and Physical    Edgar Brewer YBO:[id number] DOB: 1960/12/25 DOA: 07/05/2016  PCP: [name] Blocker, MD   Patient coming from: Home  Chief Complaint: Chest pain   HPI: Edgar Brewer is a 56 y.o. male with medical history significant for type 2 diabetes mellitus, hypertension, chronic diastolic CHF, and chronic kidney disease stage IV with recent fistula placement, now presenting to the emergency department for evaluation of chest pain. Patient reports that he was in his usual state of health until awakening this morning at approximately 5 AM with chest pain. He had been sleeping and was woken by the pains, described as severe, 10/10, sharp in character, radiating from the central chest to the back, lasting several hours, and eventually easing off without any specific intervention. While he was experiencing the pain, he noted dated to wax and wane in severity, worse with movement, but not with deep breath or cough. He did not exert himself at all today. He has also been mildly dyspneic with the pain. Prior to today, he had never experienced these symptoms before. He denies any significant cough, fever, or chills. There has not been any abdominal pain, nausea, or vomiting. He denies any injury or trauma to the chest, but notes that he was carrying something heavy yesterday. He denies indigestion or reflux symptoms.   ED Course: Upon arrival to the ED, patient is found to be afebrile, saturating well on room air, and with vitals otherwise stable. EKG features a normal sinus rhythm and chest x-ray is notable for airway thickening consistent with reactive airways disease or bronchitis, as well as mild cephalization of vessels without overt cardiomegaly. Chemistry panels notable for a BUN of 90 and a serum creatinine 4.65, up from 2.97 in February 2018. CBC is notable for slight leukocytosis to 11,100 and a stable normocytic anemia with hemoglobin of 9.2. Troponin is undetectable and BNP is slightly elevated  267. There is no chest pain at time of admission, the patient has remained hemodynamically stable, and he has not been in any apparent respiratory distress. He will be observed on the telemetry unit for ongoing evaluation and management of chest pain without known history of CAD, but with significant risk factors for such.  Review of Systems:  All other systems reviewed and apart from HPI, are negative.  Past Medical History:  Diagnosis Date  . Acute on chronic combined systolic and diastolic CHF, NYHA class 1 ([place]) 01/13/2015  . Arthritis   . Chronic kidney disease    nephrologist- Dr Lorrene Reid  . Diabetes mellitus without complication ([place])    Type II  . GERD (gastroesophageal reflux disease)    at times  . History of kidney stones   . Hypertension   . Neuropathy   . Pulmonary hypertension (HCC)    PA peak pressure 41 mmHg 03/04/16 echo  . Varicose veins   . Varicose veins of lower extremities with complications 27/78/2423    Past Surgical History:  Procedure Laterality Date  . AV FISTULA PLACEMENT Left 06/01/2016   Procedure: ARTERIOVENOUS (AV) FISTULA CREATION;  Surgeon: [name], MD;  Location: [facility] OR;  Service: Vascular;  Laterality: Left;  . COLONOSCOPY W/ POLYPECTOMY    . ENDOVENOUS ABLATION SAPHENOUS VEIN W/ LASER Left 07-23-2015   endovenous laser ablation left greater saphenous vein by [name]  . ENDOVENOUS ABLATION SAPHENOUS VEIN W/ LASER Right 10/15/2015   EVLA right greater saphenous vein by [name]     reports that he quit  smoking about 17 months ago. His smoking use included Cigarettes. He has a 45.00 pack-year smoking history. He has never used smokeless tobacco. He reports that he drinks about 0.6 oz of alcohol per week . He reports that he does not use drugs.  Allergies  Allergen Reactions  . Codeine Hives and Swelling    Swelling all over body     Family History  Problem Relation Age of Onset  . Cancer Father      Prior to Admission  medications   Medication Sig Start Date End Date Taking? Authorizing Provider  carvedilol (COREG) 25 MG tablet Take 25 mg by mouth 2 (two) times daily with a meal.   Yes [provider]  cholecalciferol (VITAMIN D) 1000 units tablet Take 1,000 Units by mouth daily.   Yes [provider]  furosemide (LASIX) 40 MG tablet Take 40 mg by mouth daily.  06/09/16  Yes [provider]  glipiZIDE (GLUCOTROL) 5 MG tablet Take 5 mg by mouth daily before breakfast.    Yes [provider]  hydrALAZINE (APRESOLINE) 25 MG tablet Take 1 tablet (25 mg total) by mouth 3 (three) times daily. 06/01/16 08/30/16 Yes Rhyne, Hulen Shouts, PA-C  vitamin C (ASCORBIC ACID) 500 MG tablet Take 1,000 mg by mouth at bedtime.    Yes [provider]  carvedilol (COREG) 25 MG tablet Take 1 tablet (25 mg total) by mouth 2 (two) times daily. 02/29/16 06/01/16  Burtis Junes, NP  furosemide (LASIX) 40 MG tablet Take 1 tablet (40 mg total) by mouth 2 (two) times daily. Patient not taking: Reported on 07/05/2016 03/01/16 06/01/16  Burtis Junes, NP  traMADol (ULTRAM) 50 MG tablet Take 1 tablet (50 mg total) by mouth every 6 (six) hours as needed. Patient not taking: Reported on 07/05/2016 06/01/16   Gabriel Earing, PA-C    Physical Exam: Vitals:   07/05/16 2046 07/05/16 2230 07/05/16 2300  BP: (!) 125/52 134/65 140/64  Pulse: 65 71 72  Resp: 20 (!) 23 (!) 22  Temp: 97.8 F (36.6 C)    TempSrc: Oral    SpO2: 97% 92% 94%  Weight: 79.4 kg (175 lb)    Height: 5\' 8"  (1.727 m)        Constitutional: NAD, calm, comfortable Eyes: PERTLA, small left conjunctival hemorrhage.  ENMT: Mucous membranes are moist. Posterior pharynx clear of any exudate or lesions.   Neck: normal, supple, no masses, no thyromegaly Respiratory: clear to auscultation bilaterally, no wheezing, no crackles. Normal respiratory effort.  Cardiovascular: S1 & S2 heard, regular rate and rhythm. No extremity edema. No  significant JVD. Immature AVF at left wrist; pulsatile with slight thrill.  Abdomen: No distension, no tenderness, no masses palpated. Bowel sounds normal.  Musculoskeletal: no clubbing / cyanosis. No joint deformity upper and lower extremities.    Skin: no significant rashes, lesions, ulcers. Warm, dry, well-perfused. Neurologic: CN 2-12 grossly intact. Sensation intact, DTR normal. Strength 5/5 in all 4 limbs.  Psychiatric: Alert and oriented x 3. Calm and cooperative.     Labs on Admission: I have personally reviewed following labs and imaging studies  CBC:  Recent Labs Lab 07/05/16 2157  WBC 11.1*  HGB 9.2*  HCT 26.8*  MCV 86.2  PLT 956   Basic Metabolic Panel:  Recent Labs Lab 07/05/16 2157  NA 139  K 4.4  CL 111  CO2 21*  GLUCOSE 173*  BUN 90*  CREATININE 4.65*  CALCIUM 9.0   GFR: Estimated  Creatinine Clearance: 17 mL/min (A) (by C-G formula based on SCr of 4.65 mg/dL (H)). Liver Function Tests: No results for input(s): AST, ALT, ALKPHOS, BILITOT, PROT, ALBUMIN in the last 168 hours. No results for input(s): LIPASE, AMYLASE in the last 168 hours. No results for input(s): AMMONIA in the last 168 hours. Coagulation Profile: No results for input(s): INR, PROTIME in the last 168 hours. Cardiac Enzymes: No results for input(s): CKTOTAL, CKMB, CKMBINDEX, TROPONINI in the last 168 hours. BNP (last 3 results)  Recent Labs  02/29/16 1439  PROBNP 7,038*   HbA1C: No results for input(s): HGBA1C in the last 72 hours. CBG: No results for input(s): GLUCAP in the last 168 hours. Lipid Profile: No results for input(s): CHOL, HDL, LDLCALC, TRIG, CHOLHDL, LDLDIRECT in the last 72 hours. Thyroid Function Tests: No results for input(s): TSH, T4TOTAL, FREET4, T3FREE, THYROIDAB in the last 72 hours. Anemia Panel: No results for input(s): VITAMINB12, FOLATE, FERRITIN, TIBC, IRON, RETICCTPCT in the last 72 hours. Urine analysis:    Component Value Date/Time    COLORURINE YELLOW 01/13/2015 0058   APPEARANCEUR CLEAR 01/13/2015 0058   LABSPEC 1.021 01/13/2015 0058   PHURINE 6.5 01/13/2015 0058   GLUCOSEU NEGATIVE 01/13/2015 0058   HGBUR LARGE (A) 01/13/2015 0058   BILIRUBINUR NEGATIVE 01/13/2015 0058   KETONESUR NEGATIVE 01/13/2015 0058   PROTEINUR >300 (A) 01/13/2015 0058   NITRITE NEGATIVE 01/13/2015 0058   LEUKOCYTESUR NEGATIVE 01/13/2015 0058   Sepsis Labs: @LABRCNTIP (procalcitonin:4,lacticidven:4) )No results found for this or any previous visit (from the past 240 hour(s)).   Radiological Exams on Admission: Dg Chest 2 View  Result Date: 07/05/2016 CLINICAL DATA:  Chest pain that wraps around to right posterior side x 4 hours. HTN and diabetic EXAM: CHEST  2 VIEW COMPARISON:  05/18/2015 FINDINGS: Airway thickening is present, suggesting bronchitis or reactive airways disease. Slight prominence of upper zone pulmonary vasculature but with normal cardiac size. Thoracic spondylosis. No pleural effusion or discrete airspace opacity. IMPRESSION: 1. Airway thickening is present, suggesting bronchitis or reactive airways disease. 2. Thoracic spondylosis. 3. Mild cephalization of blood flow which could reflect pulmonary venous hypertension, but no overt cardiomegaly at this time. Electronically Signed   By: Van Clines M.D.   On: 07/05/2016 21:22    EKG: Independently reviewed. Normal sinus rhythm.   Assessment/Plan  1. Chest pain  - Pt presents with one day of atypical chest pain; ED workup is reassuring with undetectable troponin and EKG without acute ischemic features  - He has significant risk factors for CAD, but no known hx of this  - CXR and slight leukocytosis suggests possible bronchitis; pt denies cough, wheezes, or fevers  - Plan to treat with ASA 324, continue beta-blocker, monitor on telemetry for ischemic changes, obtain serial troponin measurements, and repeat EKG in the am and prn   2. CKD stage IV - BUN 90 and SCr 4.65 on  admission, up from 47 and 2.97 in February 2018  - May be an AKI superimposed on CKD   - He is followed by nephrology and being prepared for HD with AVF placed at left wrist last month  - He appears dry on admission and his Lasix will be held; avoid nephrotoxins, no LUE blood draws or IV's, repeat chem panel in am   3. Chronic diastolic CHF   - Pt appears dry on admission  - TTE (03/04/16) with EF 97-98%, grade 2 diastolic dysfunction, mild AS, mild MR, and moderate pulmonary htn  - Managed at  home with Coreg, hydralazine, and Lasix 40 mg qD  - Plan to hold Lasix as above, continue Coreg and hydralazine as tolerated   4. Type II DM  - A1c was only 5.7% in 2016  - Managed at home with glipizide only  - Check CBG with meals and qHS - Start a low-intensity Novolog correctional and adjust prn    5. Hypertension  - BP at goal, continue Coreg and hydralazine    6. Normocytic anemia  - Hgb 9.2 and stable with no bleeding evident  - Secondary to CKD     DVT prophylaxis: sq heparin  Code Status: Full  Family Communication: Discussed with patient Disposition Plan: Observe on telemetry Consults called: None Admission status: Observation   Vianne Bulls, MD [facility] Pager [phone]  If 7PM-7AM, please contact night-coverage [email or web address] Password [email or web address]  07/06/2016, 1:09 AM

## 2016-07-07 DIAGNOSIS — N184 Chronic kidney disease, stage 4 (severe): Secondary | ICD-10-CM

## 2016-07-07 DIAGNOSIS — N179 Acute kidney failure, unspecified: Secondary | ICD-10-CM

## 2016-07-07 DIAGNOSIS — I10 Essential (primary) hypertension: Secondary | ICD-10-CM

## 2016-07-07 DIAGNOSIS — I5032 Chronic diastolic (congestive) heart failure: Secondary | ICD-10-CM

## 2016-07-07 DIAGNOSIS — D631 Anemia in chronic kidney disease: Secondary | ICD-10-CM

## 2016-07-07 DIAGNOSIS — R079 Chest pain, unspecified: Secondary | ICD-10-CM

## 2016-07-07 LAB — UREA NITROGEN, URINE: UREA NITROGEN UR: 417 mg/dL

## 2016-07-07 LAB — BASIC METABOLIC PANEL
ANION GAP: 10 (ref 5–15)
BUN: 70 mg/dL — ABNORMAL HIGH (ref 6–20)
CALCIUM: 8.8 mg/dL — AB (ref 8.9–10.3)
CO2: 20 mmol/L — AB (ref 22–32)
CREATININE: 3.58 mg/dL — AB (ref 0.61–1.24)
Chloride: 111 mmol/L (ref 101–111)
GFR, EST AFRICAN AMERICAN: 20 mL/min — AB (ref >60)
GFR, EST NON AFRICAN AMERICAN: 17 mL/min — AB (ref >60)
GLUCOSE: 158 mg/dL — AB (ref 65–99)
Potassium: 4.1 mmol/L (ref 3.5–5.1)
Sodium: 141 mmol/L (ref 135–145)

## 2016-07-07 LAB — HEMOGLOBIN A1C
HEMOGLOBIN A1C: 5.3 % (ref 4.8–5.6)
MEAN PLASMA GLUCOSE: 105 mg/dL

## 2016-07-07 LAB — GLUCOSE, CAPILLARY: Glucose-Capillary: 149 mg/dL — ABNORMAL HIGH (ref 65–99)

## 2016-07-07 MED ORDER — SODIUM CHLORIDE 0.9% FLUSH
3.0000 mL | Freq: Two times a day (BID) | INTRAVENOUS | Status: DC
  Administered 2016-07-07: 3 mL via INTRAVENOUS

## 2016-07-07 MED ORDER — SODIUM CHLORIDE 0.9 % IV SOLN: 250.0000 mL | INTRAVENOUS | Status: DC | PRN

## 2016-07-07 MED ORDER — SODIUM CHLORIDE 0.9% FLUSH: 3.0000 mL | INTRAVENOUS | Status: DC | PRN

## 2016-07-07 NOTE — Progress Notes (Signed)
Pt discharging with no HH needs.

## 2016-07-07 NOTE — Discharge Summary (Signed)
Physician Discharge Summary  Edgar Brewer VQQ:[id number] DOB: 02/27/1960 DOA: 07/05/2016  PCP: [name] Blocker, MD  Admit date: 07/05/2016 Discharge date: 07/07/2016  Time spent: 35 minutes  Recommendations for Outpatient Follow-up:  1. Follow  Up PCP in 2 weeks 2. Follow up nephrology in 1 week 3. Hold Lasix for two days, start taking on 07/10/16   Discharge Diagnoses:  Principal Problem:   Chest pain at rest Active Problems:   Chronic diastolic CHF (congestive heart failure) (HCC)   CKD (chronic kidney disease), stage IV (HCC)   Diabetes mellitus type II, non insulin dependent (HCC)   Accelerated hypertension   Anemia of chronic kidney failure   AKI (acute kidney injury) ([place])   Chest pain   Discharge Condition: Stable  Diet recommendation: Heart healthy diet  Filed Weights   07/05/16 2046 07/06/16 0324  Weight: 79.4 kg (175 lb) 76.4 kg (168 lb 6.9 oz)    History of present illness:  56 year old male with past medical hx of type 2 DM, HTN, chronic diastolic CHF & stg IV CKD STATUS post recent fistula placement resentment to the emergency room on the evening of 6/12 with complaints of chest pain. Patient described as sharp, 10/10 radiating from just left of midsternal old way across the right side and then into the back. Initially last for several hours that morning then eased off but then returned. No associated shortness of breath. This is a new symptom for the patient. The patient came in initial EKG and troponins were normal. However, his creatinine was elevated at 4.65 with a BUN of 90. His last creatinine was 2.97 active February. Patient still making urine. Hemoglobin was unchanged when compared to previous. Patient on diuretics which were held and brought into the hospitalist service.  Hospital Course:   Acute kidney injury-in setting of stage IV chronic kidney disease, likely from overdiuresis. Patient was started on IV fluids and diuretics were held. Creatinine today has  improved to 3.58. Patient and creatinine is around 3. I called and discussed with nephrology Dr. Jonnie Finner, who recommends holding Lasix for 2 more days and then restarting Lasix 40 mg daily on 07/10/2016. Patient can follow-up with nephrology as outpatient. It seems patient has seen nephrologist in the past.  Chest pain-resolved, atypical, cardiac enzymes 3 are negative. Likely musculoskeletal. Continue tramadol when necessary for pain  Diabetes mellitus- continue glipizide at home dose.  Chronic diastolic CHF- patient has faint crackles bilaterally at lung bases, IV fluids have been discontinued. Patient is not requiring oxygen, does not complain of shortness of breath. He will be discharged home and advised to hold Lasix for 2 more days and restart on 07/10/2016.  Hypertension-blood pressure stable, continue Coreg, hydralazine  Anemia of chronic disease- hemoglobin is stable.  Procedures:    Consultations:    Discharge Exam: Vitals:   07/06/16 2054 07/07/16 0829  BP: (!) 145/61 (!) 144/63  Pulse: 81 81  Resp: 16   Temp: 99.2 F (37.3 C)     General: Appears in no acute distress Cardiovascular: S1-S2, RRR Respiratory: Faint crackles auscultated bilaterally  Discharge Instructions   Discharge Instructions    Diet - low sodium heart healthy    Complete by:  As directed    Discharge instructions    Complete by:  As directed    Hold Lasix for next two days and start taking on  07/10/16   Increase activity slowly    Complete by:  As directed      Current Discharge  Medication List    CONTINUE these medications which have NOT CHANGED   Details  carvedilol (COREG) 25 MG tablet Take 25 mg by mouth 2 (two) times daily with a meal.    cholecalciferol (VITAMIN D) 1000 units tablet Take 1,000 Units by mouth daily.    furosemide (LASIX) 40 MG tablet Take 40 mg by mouth daily.  Refills: 0    glipiZIDE (GLUCOTROL) 5 MG tablet Take 5 mg by mouth daily before breakfast.      hydrALAZINE (APRESOLINE) 25 MG tablet Take 1 tablet (25 mg total) by mouth 3 (three) times daily.    vitamin C (ASCORBIC ACID) 500 MG tablet Take 1,000 mg by mouth at bedtime.     traMADol (ULTRAM) 50 MG tablet Take 1 tablet (50 mg total) by mouth every 6 (six) hours as needed. Qty: 8 tablet, Refills: 0       Allergies  Allergen Reactions  . Codeine Hives and Swelling    Swelling all over body    Follow-up Information    [name] Blocker, MD Follow up in 2 week(s).   Specialty:  Internal Medicine Contact information: [place] [address] [phone]        Elmarie Shiley, MD. Schedule an appointment as soon as possible for a visit in 1 week(s).   Specialty:  Nephrology Contact information: [place] [address] [phone]            The results of significant diagnostics from this hospitalization (including imaging, microbiology, ancillary and laboratory) are listed below for reference.    Significant Diagnostic Studies: Dg Chest 2 View  Result Date: 07/05/2016 CLINICAL DATA:  Chest pain that wraps around to right posterior side x 4 hours. HTN and diabetic EXAM: CHEST  2 VIEW COMPARISON:  05/18/2015 FINDINGS: Airway thickening is present, suggesting bronchitis or reactive airways disease. Slight prominence of upper zone pulmonary vasculature but with normal cardiac size. Thoracic spondylosis. No pleural effusion or discrete airspace opacity. IMPRESSION: 1. Airway thickening is present, suggesting bronchitis or reactive airways disease. 2. Thoracic spondylosis. 3. Mild cephalization of blood flow which could reflect pulmonary venous hypertension, but no overt cardiomegaly at this time. Electronically Signed   By: Van Clines M.D.   On: 07/05/2016 21:22    Microbiology: No results found for this or any previous visit (from the past 240 hour(s)).   Labs: Basic Metabolic Panel:  Recent Labs Lab 07/05/16 2157 07/06/16 0539  07/07/16 0511  NA 139 142 141  K 4.4 4.0 4.1  CL 111 110 111  CO2 21* 22 20*  GLUCOSE 173* 128* 158*  BUN 90* 89* 70*  CREATININE 4.65* 4.27* 3.58*  CALCIUM 9.0 9.0 8.8*   Liver Function Tests: No results for input(s): AST, ALT, ALKPHOS, BILITOT, PROT, ALBUMIN in the last 168 hours. No results for input(s): LIPASE, AMYLASE in the last 168 hours. No results for input(s): AMMONIA in the last 168 hours. CBC:  Recent Labs Lab 07/05/16 2157 07/06/16 0539  WBC 11.1* 9.5  HGB 9.2* 9.2*  HCT 26.8* 26.6*  MCV 86.2 84.4  PLT 186 185   Cardiac Enzymes:  Recent Labs Lab 07/06/16 0111 07/06/16 0539 07/06/16 1249  TROPONINI <0.03 <0.03 <0.03   BNP: BNP (last 3 results)  Recent Labs  07/05/16 2157  BNP 167.7*    ProBNP (last 3 results)  Recent Labs  02/29/16 1439  PROBNP 7,038*    CBG:  Recent Labs Lab 07/06/16 0731 07/06/16 1200 07/06/16  1631 07/06/16 2057 07/07/16 0729  GLUCAP 108* 139* 195* 217* 149*       Signed:  Oswald Hillock MD.  [facility] 07/07/2016, 10:58 AM

## 2016-07-07 NOTE — Progress Notes (Signed)
D/c to home w/ mother d/c instructions given w/ verbal understanding

## 2016-07-12 ENCOUNTER — Encounter: Payer: Self-pay | Admitting: Vascular Surgery

## 2016-07-12 ENCOUNTER — Ambulatory Visit (INDEPENDENT_AMBULATORY_CARE_PROVIDER_SITE_OTHER): Payer: Self-pay | Admitting: Vascular Surgery

## 2016-07-12 VITALS — BP 134/68 | HR 63 | Temp 98.0°F | Resp 20 | Ht 68.0 in | Wt 168.0 lb

## 2016-07-12 DIAGNOSIS — N184 Chronic kidney disease, stage 4 (severe): Secondary | ICD-10-CM

## 2016-07-12 NOTE — Progress Notes (Signed)
   Patient name: Jabar Krysiak MRN: [id number] DOB: 06/14/1960 Sex: male  REASON FOR VISIT: Left left radiocephalic AV fistula creation by myself on 06/01/2013  HPI: Tayvin Preslar is a 56 y.o. male today for follow-up. He reports some peri-incisional numbness below the incision at his wrist. No steal symptoms.  Current Outpatient Prescriptions  Medication Sig Dispense Refill  . carvedilol (COREG) 25 MG tablet Take 25 mg by mouth 2 (two) times daily with a meal.    . cholecalciferol (VITAMIN D) 1000 units tablet Take 1,000 Units by mouth daily.    . furosemide (LASIX) 40 MG tablet Take 40 mg by mouth daily.   0  . glipiZIDE (GLUCOTROL) 5 MG tablet Take 5 mg by mouth daily before breakfast.     . hydrALAZINE (APRESOLINE) 25 MG tablet Take 1 tablet (25 mg total) by mouth 3 (three) times daily.    . traMADol (ULTRAM) 50 MG tablet Take 1 tablet (50 mg total) by mouth every 6 (six) hours as needed. 8 tablet 0  . vitamin C (ASCORBIC ACID) 500 MG tablet Take 1,000 mg by mouth at bedtime.      No current facility-administered medications for this visit.      PHYSICAL EXAM: Vitals:   07/12/16 1137  BP: 134/68  Pulse: 63  Resp: 20  Temp: 98 F (36.7 C)  TempSrc: Oral  SpO2: 95%  Weight: 168 lb (76.2 kg)  Height: 5\' 8"  (1.727 m)    GENERAL: The patient is a well-nourished male, in no acute distress. The vital signs are documented above. Incision in his hand is healed quite nicely. He has excellent Phinneas Shakoor maturation of the cephalic vein fistula. It is very close to the surface and runs and the straight course. He has an excellent thrill.  MEDICAL ISSUES: Chronic renal insufficiency. Has very nice Demerius Podolak maturation of his fistula. Would wait a total of 3 months and then would begin using fistula if needed. Will see [place] again on an as-needed basis   [name], MD [facility] Tel [phone] Pager [phone]

## 2016-07-19 ENCOUNTER — Ambulatory Visit (HOSPITAL_COMMUNITY)
Admission: RE | Admit: 2016-07-19 | Discharge: 2016-07-19 | Disposition: A | Discharge status: Home / Self Care | Payer: BLUE CROSS/BLUE SHIELD | Source: Ambulatory Visit | Attending: Nephrology | Admitting: Nephrology

## 2016-07-19 DIAGNOSIS — D631 Anemia in chronic kidney disease: Secondary | ICD-10-CM | POA: Insufficient documentation

## 2016-07-19 DIAGNOSIS — N184 Chronic kidney disease, stage 4 (severe): Secondary | ICD-10-CM | POA: Diagnosis not present

## 2016-07-19 LAB — POCT HEMOGLOBIN-HEMACUE: HEMOGLOBIN: 9.4 g/dL — AB (ref 13.0–17.0)

## 2016-07-19 MED ORDER — CLONIDINE HCL 0.1 MG PO TABS: 0.1000 mg | ORAL_TABLET | Freq: Once | ORAL | Status: DC | PRN

## 2016-07-19 MED ORDER — DARBEPOETIN ALFA 100 MCG/0.5ML IJ SOSY
100.0000 ug | PREFILLED_SYRINGE | INTRAMUSCULAR | Status: DC
  Administered 2016-07-19: 100 ug via SUBCUTANEOUS

## 2016-07-19 MED ORDER — DARBEPOETIN ALFA 100 MCG/0.5ML IJ SOSY
PREFILLED_SYRINGE | INTRAMUSCULAR | Status: AC
  Filled 2016-07-19: qty 0.5

## 2016-07-19 NOTE — Discharge Instructions (Signed)

## 2016-08-16 ENCOUNTER — Encounter (HOSPITAL_COMMUNITY)
Admission: RE | Admit: 2016-08-16 | Discharge: 2016-08-16 | Disposition: A | Discharge status: Home / Self Care | Payer: BLUE CROSS/BLUE SHIELD | Source: Ambulatory Visit | Attending: Nephrology | Admitting: Nephrology

## 2016-08-16 DIAGNOSIS — N184 Chronic kidney disease, stage 4 (severe): Secondary | ICD-10-CM | POA: Diagnosis not present

## 2016-08-16 DIAGNOSIS — D631 Anemia in chronic kidney disease: Secondary | ICD-10-CM | POA: Insufficient documentation

## 2016-08-16 LAB — FERRITIN: Ferritin: 129 ng/mL (ref 24–336)

## 2016-08-16 LAB — IRON AND TIBC
Iron: <5 ug/dL — ABNORMAL LOW (ref 45–182)
TIBC: 242 ug/dL — ABNORMAL LOW (ref 250–450)

## 2016-08-16 LAB — POCT HEMOGLOBIN-HEMACUE: HEMOGLOBIN: 10.7 g/dL — AB (ref 13.0–17.0)

## 2016-08-16 MED ORDER — DARBEPOETIN ALFA 100 MCG/0.5ML IJ SOSY
PREFILLED_SYRINGE | INTRAMUSCULAR | Status: AC
  Filled 2016-08-16: qty 0.5

## 2016-08-16 MED ORDER — DARBEPOETIN ALFA 100 MCG/0.5ML IJ SOSY
100.0000 ug | PREFILLED_SYRINGE | INTRAMUSCULAR | Status: DC
  Administered 2016-08-16: 13:00:00 100 ug via SUBCUTANEOUS

## 2016-08-16 MED ORDER — CLONIDINE HCL 0.1 MG PO TABS
0.1000 mg | ORAL_TABLET | Freq: Once | ORAL | Status: DC | PRN
Start: 2016-08-16 — End: 2016-08-17

## 2016-08-31 ENCOUNTER — Other Ambulatory Visit (HOSPITAL_COMMUNITY): Payer: Self-pay | Admitting: *Deleted

## 2016-09-01 ENCOUNTER — Encounter (HOSPITAL_COMMUNITY)
Admission: RE | Admit: 2016-09-01 | Discharge: 2016-09-01 | Disposition: A | Discharge status: Home / Self Care | Payer: BLUE CROSS/BLUE SHIELD | Source: Ambulatory Visit | Attending: Nephrology | Admitting: Nephrology

## 2016-09-01 DIAGNOSIS — N184 Chronic kidney disease, stage 4 (severe): Secondary | ICD-10-CM | POA: Insufficient documentation

## 2016-09-01 DIAGNOSIS — D631 Anemia in chronic kidney disease: Secondary | ICD-10-CM | POA: Insufficient documentation

## 2016-09-01 MED ORDER — SODIUM CHLORIDE 0.9 % IV SOLN
510.0000 mg | INTRAVENOUS | Status: DC
  Administered 2016-09-01: 510 mg via INTRAVENOUS
  Filled 2016-09-01: qty 17

## 2016-09-08 ENCOUNTER — Encounter (HOSPITAL_COMMUNITY)
Admission: RE | Admit: 2016-09-08 | Discharge: 2016-09-08 | Disposition: A | Discharge status: Home / Self Care | Payer: BLUE CROSS/BLUE SHIELD | Source: Ambulatory Visit | Attending: Nephrology | Admitting: Nephrology

## 2016-09-08 DIAGNOSIS — N189 Chronic kidney disease, unspecified: Secondary | ICD-10-CM | POA: Insufficient documentation

## 2016-09-08 DIAGNOSIS — D631 Anemia in chronic kidney disease: Secondary | ICD-10-CM | POA: Insufficient documentation

## 2016-09-08 MED ORDER — FERUMOXYTOL INJECTION 510 MG/17 ML
510.0000 mg | INTRAVENOUS | Status: AC
  Administered 2016-09-08: 11:00:00 510 mg via INTRAVENOUS
  Filled 2016-09-08: qty 17

## 2016-09-13 ENCOUNTER — Encounter (HOSPITAL_COMMUNITY)
Admission: RE | Admit: 2016-09-13 | Discharge: 2016-09-13 | Disposition: A | Discharge status: Home / Self Care | Payer: BLUE CROSS/BLUE SHIELD | Source: Ambulatory Visit | Attending: Nephrology | Admitting: Nephrology

## 2016-09-13 DIAGNOSIS — N184 Chronic kidney disease, stage 4 (severe): Secondary | ICD-10-CM | POA: Diagnosis not present

## 2016-09-13 DIAGNOSIS — D631 Anemia in chronic kidney disease: Secondary | ICD-10-CM

## 2016-09-13 LAB — IRON AND TIBC
IRON: 87 ug/dL (ref 45–182)
Saturation Ratios: 37 % (ref 17.9–39.5)
TIBC: 232 ug/dL — ABNORMAL LOW (ref 250–450)
UIBC: 145 ug/dL

## 2016-09-13 LAB — FERRITIN: Ferritin: 428 ng/mL — ABNORMAL HIGH (ref 24–336)

## 2016-09-13 LAB — POCT HEMOGLOBIN-HEMACUE: HEMOGLOBIN: 11.4 g/dL — AB (ref 13.0–17.0)

## 2016-09-13 MED ORDER — DARBEPOETIN ALFA 100 MCG/0.5ML IJ SOSY
100.0000 ug | PREFILLED_SYRINGE | INTRAMUSCULAR | Status: DC
  Administered 2016-09-13: 100 ug via SUBCUTANEOUS

## 2016-09-13 MED ORDER — DARBEPOETIN ALFA 100 MCG/0.5ML IJ SOSY
PREFILLED_SYRINGE | INTRAMUSCULAR | Status: AC
  Administered 2016-09-13: 13:00:00 100 ug via SUBCUTANEOUS
  Filled 2016-09-13: qty 0.5

## 2016-10-11 ENCOUNTER — Inpatient Hospital Stay (HOSPITAL_COMMUNITY): Admission: RE | Admit: 2016-10-11 | Payer: BLUE CROSS/BLUE SHIELD | Source: Ambulatory Visit

## 2016-10-19 ENCOUNTER — Encounter (HOSPITAL_COMMUNITY)
Admission: RE | Admit: 2016-10-19 | Discharge: 2016-10-19 | Disposition: A | Discharge status: Home / Self Care | Payer: BLUE CROSS/BLUE SHIELD | Source: Ambulatory Visit | Attending: Nephrology | Admitting: Nephrology

## 2016-10-19 DIAGNOSIS — D631 Anemia in chronic kidney disease: Secondary | ICD-10-CM | POA: Diagnosis present

## 2016-10-19 DIAGNOSIS — N184 Chronic kidney disease, stage 4 (severe): Secondary | ICD-10-CM | POA: Diagnosis present

## 2016-10-19 LAB — IRON AND TIBC
IRON: 76 ug/dL (ref 45–182)
SATURATION RATIOS: 33 % (ref 17.9–39.5)
TIBC: 230 ug/dL — AB (ref 250–450)
UIBC: 154 ug/dL

## 2016-10-19 LAB — FERRITIN: FERRITIN: 227 ng/mL (ref 24–336)

## 2016-10-19 LAB — POCT HEMOGLOBIN-HEMACUE: Hemoglobin: 12.4 g/dL — ABNORMAL LOW (ref 13.0–17.0)

## 2016-10-19 MED ORDER — DARBEPOETIN ALFA 100 MCG/0.5ML IJ SOSY: 100.0000 ug | PREFILLED_SYRINGE | INTRAMUSCULAR | Status: DC

## 2016-11-02 ENCOUNTER — Encounter (HOSPITAL_COMMUNITY)
Admission: RE | Admit: 2016-11-02 | Discharge: 2016-11-02 | Disposition: A | Discharge status: Home / Self Care | Payer: BLUE CROSS/BLUE SHIELD | Source: Ambulatory Visit | Attending: Nephrology | Admitting: Nephrology

## 2016-11-02 DIAGNOSIS — N184 Chronic kidney disease, stage 4 (severe): Secondary | ICD-10-CM | POA: Diagnosis present

## 2016-11-02 DIAGNOSIS — D631 Anemia in chronic kidney disease: Secondary | ICD-10-CM

## 2016-11-02 LAB — POCT HEMOGLOBIN-HEMACUE: Hemoglobin: 11.4 g/dL — ABNORMAL LOW (ref 13.0–17.0)

## 2016-11-02 MED ORDER — DARBEPOETIN ALFA 100 MCG/0.5ML IJ SOSY
100.0000 ug | PREFILLED_SYRINGE | INTRAMUSCULAR | Status: DC
  Administered 2016-11-02: 13:00:00 100 ug via SUBCUTANEOUS

## 2016-11-02 MED ORDER — DARBEPOETIN ALFA 100 MCG/0.5ML IJ SOSY
PREFILLED_SYRINGE | INTRAMUSCULAR | Status: AC
  Filled 2016-11-02: qty 0.5

## 2016-11-03 NOTE — Progress Notes (Signed)
CARDIOLOGY OFFICE NOTE  Date:  11/07/2016    Edgar Brewer Date of Birth: 26-Dec-1960 Medical Record #[id number]  PCP:  Edgar Blocker, MD  Cardiologist:  Edgar Brewer chief complaint on file.   History of Present Illness: Edgar Brewer is a 56 y.o. male who presents today for f/u CHF    He has chronic  diastolic HF, CRF with baseline CR 3.6 - 4.0 Anemia,  DM, neuropathy and varicose veins with prior ablation therapy.  Echo reviewed 03/04/16 and showed normal systolic function with grade 2 diastolic dysfunction and estimated PA  41 mmHg  Study Conclusions  - Left ventricle: The cavity size was normal. There was mild   concentric hypertrophy. Systolic function was normal. The   estimated ejection fraction was in the range of 55% to 60%. Wall   motion was normal; there were no regional wall motion   abnormalities. Features are consistent with a pseudonormal left   ventricular filling pattern, with concomitant abnormal relaxation   and increased filling pressure (grade 2 diastolic dysfunction).   Doppler parameters are consistent with high ventricular filling   pressure. - Aortic valve: There was very mild stenosis. - Mitral valve: There was mild regurgitation. - Atrial septum: There was increased thickness of the septum,   consistent with lipomatous hypertrophy. - Pulmonary arteries: PA peak pressure: 41 mm Hg (S). - Pericardium, extracardiac: A small, free-flowing pericardial   effusion was identified circumferential to the heart. The fluid   had no internal echoes.There was no evidence of hemodynamic   compromise. There was mildright atrial chamber collapse for less   than 50% of the cardiac cycle.  Impressions:  - The right ventricular systolic pressure was increased consistent   with moderate pulmonary hypertension.  Renal duplex showed no RAS  Abdominal US ? Small amount of ascites adjacent to liver  Has had progressive renal failure with Cr now 3.6-4.6   Had  fistula placed in May 2018  Endoscopy done 04/05/16 with mild duodenitis no source bleeding no varices Colonoscopy showed multiple polyps That were removed and diverticular disease.   Past Medical History:  Diagnosis Date  . Acute on chronic combined systolic and diastolic CHF, NYHA class 1 ([place]) 01/13/2015  . Arthritis   . Chronic kidney disease    nephrologist- Dr Edgar Brewer  . Diabetes mellitus without complication ([place])    Type II  . GERD (gastroesophageal reflux disease)    at times  . History of kidney stones   . Hypertension   . Neuropathy   . Pulmonary hypertension (HCC)    PA peak pressure 41 mmHg 03/04/16 echo  . Varicose veins   . Varicose veins of lower extremities with complications 18/84/1660    Past Surgical History:  Procedure Laterality Date  . AV FISTULA PLACEMENT Left 06/01/2016   Procedure: ARTERIOVENOUS (AV) FISTULA CREATION;  Surgeon: Edgar Posner, MD;  Location: [facility] OR;  Service: Vascular;  Laterality: Left;  . COLONOSCOPY W/ POLYPECTOMY    . ENDOVENOUS ABLATION SAPHENOUS VEIN W/ LASER Left 07-23-2015   endovenous laser ablation left greater saphenous vein by Edgar Jews MD  . ENDOVENOUS ABLATION SAPHENOUS VEIN W/ LASER Right 10/15/2015   EVLA right greater saphenous vein by Edgar Jews MD     Medications: Current Outpatient Prescriptions  Medication Sig Dispense Refill  . carvedilol (COREG) 25 MG tablet Take 25 mg by mouth 2 (two) times daily with a meal.    . cholecalciferol (VITAMIN D) 1000 units tablet  Take 1,000 Units by mouth daily.    . furosemide (LASIX) 40 MG tablet Take 40 mg by mouth daily.   0  . glipiZIDE (GLUCOTROL) 5 MG tablet Take 5 mg by mouth daily before breakfast.     . hydrALAZINE (APRESOLINE) 25 MG tablet Take 1 tablet (25 mg total) by mouth 3 (three) times daily.    . traMADol (ULTRAM) 50 MG tablet Take 1 tablet (50 mg total) by mouth every 6 (six) hours as needed. 8 tablet 0  . vitamin C (ASCORBIC ACID) 500 MG tablet Take 1,000 mg by  mouth at bedtime.      No current facility-administered medications for this visit.     Allergies: Allergies  Allergen Reactions  . Codeine Hives and Swelling    Swelling all over body     Social History: The patient  reports that he quit smoking about 21 months ago. His smoking use included Cigarettes. He has a 45.00 pack-year smoking history. He has never used smokeless tobacco. He reports that he drinks about 0.6 oz of alcohol per week . He reports that he does not use drugs.   Family History: The patient's family history includes Cancer in his father.   Review of Systems: Please see the history of present illness.   Otherwise, the review of systems is positive for none.   All other systems are reviewed and negative.   Physical Exam: VS:  BP 138/74   Pulse 80   Ht 5\' 8"  (1.727 m)   Wt 193 lb 8 oz (87.8 kg)   BMI 29.42 kg/m  .  BMI Body mass index is 29.42 kg/m.  Wt Readings from Last 3 Encounters:  11/07/16 193 lb 8 oz (87.8 kg)  09/01/16 176 lb (79.8 kg)  07/12/16 168 lb (76.2 kg)    Affect appropriate Healthy:  appears stated age HEENT: normal Neck supple with no adenopathy JVP normal no bruits no thyromegaly Lungs clear with no wheezing and good diaphragmatic motion Heart:  S1/S2 SEM  murmur, no rub, gallop or click PMI normal Abdomen: benighn, BS positve, no tenderness, no AAA no bruit.  No HSM or HJR Left radio-cephalic fistula with good thrill No edema Neuro non-focal Skin warm and dry No muscular weakness   LABORATORY DATA:  EKG:   07/06/16 NSR with nonspecific T wave changes.  Lab Results  Component Value Date   WBC 9.5 07/06/2016   HGB 11.4 (L) 11/02/2016   HCT 26.6 (L) 07/06/2016   PLT 185 07/06/2016   GLUCOSE 158 (H) 07/07/2016   ALT 24 02/29/2016   AST 29 02/29/2016   NA 141 07/07/2016   K 4.1 07/07/2016   CL 111 07/07/2016   CREATININE 3.58 (H) 07/07/2016   BUN 70 (H) 07/07/2016   CO2 20 (L) 07/07/2016   TSH 1.256 01/13/2015    INR 1.08 01/13/2015   HGBA1C 5.3 07/06/2016    BNP (last 3 results)  Recent Labs  07/05/16 2157  BNP 167.7*    ProBNP (last 3 results)  Recent Labs  02/29/16 1439  PROBNP 7,038*     Other Studies Reviewed Today:  Myoview Study Highlights from 05/2014    Myocardial perfusion is normal. The study is abormal. This is an intermediate risk study. Overall left ventricular systolic function was abnormal. LV cavity size is normal. The left ventricular ejection fraction is moderately decreased (42%). There is no prior study for comparison.   Notes Recorded by Josue Hector, MD on 06/25/2014 at 8:31  AM Increase cozaar to 100 mg decrease lasix to once/day F/u with me to discuss cath EF low   Echo Study Conclusions from 05/2014  - Left ventricle: The cavity size was mildly dilated. Wall   thickness was increased in a pattern of mild LVH. Systolic   function was mildly reduced. The estimated ejection fraction was   in the range of 45% to 50%. Diffuse hypokinesis. Doppler   parameters are consistent with elevated ventricular end-diastolic   filling pressure. - Mitral valve: There was mild regurgitation. - Left atrium: The atrium was mildly dilated. - Atrial septum: No defect or patent foramen ovale was identified. - Pulmonary arteries: PA peak pressure: 52 mm Hg (S). - Pericardium, extracardiac: Small to moderate circumferential   pericardial effusion no tamponade.   Assessment/Plan: 1. Anemia:  Multiple polyps removed f/u GI   2. Chronic diastolic HF -  Not clear that this is an issue suspect fluid from A/CRF continue lasix  3. Varicose veins/venous insufficiency - chronic. Refer back to Dr Early ? Sclerosing/ablative Rx  4. CKD -   CR 3.6-4.6 f/u nephrology AV fistula done 06/01/16 by Dr Early F/U Dr Edgar Brewer still Making good urine   5. Anemia - most likely from CKD.  6. DM - followed by PCP  7. Prior tobacco abuse - tells me he is not smoking  8. SEM:  Louder than  before likely due to fistula will do echo in a year next visit    Edgar Brewer

## 2016-11-07 ENCOUNTER — Encounter: Payer: Self-pay | Admitting: Cardiovascular Disease

## 2016-11-07 ENCOUNTER — Ambulatory Visit (INDEPENDENT_AMBULATORY_CARE_PROVIDER_SITE_OTHER): Payer: BLUE CROSS/BLUE SHIELD | Admitting: Cardiovascular Disease

## 2016-11-07 VITALS — BP 138/74 | HR 80 | Ht 68.0 in | Wt 193.5 lb

## 2016-11-07 DIAGNOSIS — I5032 Chronic diastolic (congestive) heart failure: Secondary | ICD-10-CM

## 2016-11-07 NOTE — Patient Instructions (Addendum)

## 2016-12-01 ENCOUNTER — Encounter (HOSPITAL_COMMUNITY)
Admission: RE | Admit: 2016-12-01 | Discharge: 2016-12-01 | Disposition: A | Discharge status: Home / Self Care | Payer: BLUE CROSS/BLUE SHIELD | Source: Ambulatory Visit | Attending: Nephrology | Admitting: Nephrology

## 2016-12-01 VITALS — BP 134/60 | HR 66 | Temp 98.2°F | Resp 18

## 2016-12-01 DIAGNOSIS — N184 Chronic kidney disease, stage 4 (severe): Secondary | ICD-10-CM | POA: Diagnosis not present

## 2016-12-01 DIAGNOSIS — D631 Anemia in chronic kidney disease: Secondary | ICD-10-CM | POA: Diagnosis present

## 2016-12-01 LAB — IRON AND TIBC
Iron: 45 ug/dL (ref 45–182)
Saturation Ratios: 19 % (ref 17.9–39.5)
TIBC: 239 ug/dL — AB (ref 250–450)
UIBC: 194 ug/dL

## 2016-12-01 LAB — POCT HEMOGLOBIN-HEMACUE: HEMOGLOBIN: 10.5 g/dL — AB (ref 13.0–17.0)

## 2016-12-01 LAB — FERRITIN: Ferritin: 283 ng/mL (ref 24–336)

## 2016-12-01 MED ORDER — DARBEPOETIN ALFA 100 MCG/0.5ML IJ SOSY
PREFILLED_SYRINGE | INTRAMUSCULAR | Status: AC
  Administered 2016-12-01: 100 ug
  Filled 2016-12-01: qty 0.5

## 2016-12-01 MED ORDER — DARBEPOETIN ALFA 100 MCG/0.5ML IJ SOSY: 100.0000 ug | PREFILLED_SYRINGE | INTRAMUSCULAR | Status: DC

## 2016-12-29 ENCOUNTER — Encounter (HOSPITAL_COMMUNITY)
Admission: RE | Admit: 2016-12-29 | Discharge: 2016-12-29 | Disposition: A | Discharge status: Home / Self Care | Payer: BLUE CROSS/BLUE SHIELD | Source: Ambulatory Visit | Attending: Nephrology | Admitting: Nephrology

## 2016-12-29 VITALS — BP 111/50 | HR 66 | Temp 97.5°F | Resp 18

## 2016-12-29 DIAGNOSIS — N184 Chronic kidney disease, stage 4 (severe): Secondary | ICD-10-CM | POA: Insufficient documentation

## 2016-12-29 DIAGNOSIS — D631 Anemia in chronic kidney disease: Secondary | ICD-10-CM | POA: Insufficient documentation

## 2016-12-29 LAB — IRON AND TIBC
IRON: 84 ug/dL (ref 45–182)
SATURATION RATIOS: 36 % (ref 17.9–39.5)
TIBC: 237 ug/dL — AB (ref 250–450)
UIBC: 153 ug/dL

## 2016-12-29 LAB — POCT HEMOGLOBIN-HEMACUE: Hemoglobin: 10.9 g/dL — ABNORMAL LOW (ref 13.0–17.0)

## 2016-12-29 LAB — FERRITIN: FERRITIN: 241 ng/mL (ref 24–336)

## 2016-12-29 MED ORDER — DARBEPOETIN ALFA 100 MCG/0.5ML IJ SOSY
PREFILLED_SYRINGE | INTRAMUSCULAR | Status: AC
  Administered 2016-12-29: 100 ug via SUBCUTANEOUS
  Filled 2016-12-29: qty 0.5

## 2016-12-29 MED ORDER — DARBEPOETIN ALFA 100 MCG/0.5ML IJ SOSY
100.0000 ug | PREFILLED_SYRINGE | INTRAMUSCULAR | Status: DC
  Administered 2016-12-29: 100 ug via SUBCUTANEOUS

## 2017-01-26 ENCOUNTER — Encounter (HOSPITAL_COMMUNITY)
Admission: RE | Admit: 2017-01-26 | Discharge: 2017-01-26 | Disposition: A | Discharge status: Home / Self Care | Payer: BLUE CROSS/BLUE SHIELD | Source: Ambulatory Visit | Attending: Nephrology | Admitting: Nephrology

## 2017-01-26 VITALS — BP 131/61 | HR 67 | Temp 97.7°F | Resp 18

## 2017-01-26 DIAGNOSIS — N184 Chronic kidney disease, stage 4 (severe): Secondary | ICD-10-CM | POA: Diagnosis present

## 2017-01-26 DIAGNOSIS — D631 Anemia in chronic kidney disease: Secondary | ICD-10-CM | POA: Insufficient documentation

## 2017-01-26 LAB — IRON AND TIBC
IRON: 68 ug/dL (ref 45–182)
Saturation Ratios: 28 % (ref 17.9–39.5)
TIBC: 246 ug/dL — AB (ref 250–450)
UIBC: 178 ug/dL

## 2017-01-26 LAB — FERRITIN: Ferritin: 197 ng/mL (ref 24–336)

## 2017-01-26 LAB — POCT HEMOGLOBIN-HEMACUE: Hemoglobin: 11.2 g/dL — ABNORMAL LOW (ref 13.0–17.0)

## 2017-01-26 MED ORDER — DARBEPOETIN ALFA 100 MCG/0.5ML IJ SOSY
100.0000 ug | PREFILLED_SYRINGE | INTRAMUSCULAR | Status: DC
  Administered 2017-01-26: 100 ug via SUBCUTANEOUS

## 2017-01-26 MED ORDER — DARBEPOETIN ALFA 100 MCG/0.5ML IJ SOSY
PREFILLED_SYRINGE | INTRAMUSCULAR | Status: AC
  Filled 2017-01-26: qty 0.5

## 2017-03-02 ENCOUNTER — Encounter (HOSPITAL_COMMUNITY): Payer: BLUE CROSS/BLUE SHIELD

## 2017-04-09 ENCOUNTER — Encounter: Payer: Self-pay | Admitting: Gastroenterology

## 2017-06-24 ENCOUNTER — Emergency Department (HOSPITAL_COMMUNITY)
Admission: EM | Admit: 2017-06-24 | Discharge: 2017-06-24 | Disposition: A | Discharge status: Home / Self Care | Payer: Medicare Other | Attending: Emergency Medicine | Admitting: Emergency Medicine

## 2017-06-24 ENCOUNTER — Inpatient Hospital Stay (HOSPITAL_COMMUNITY): Admission: RE | Admit: 2017-06-24 | Payer: Self-pay | Source: Home / Self Care

## 2017-06-24 ENCOUNTER — Other Ambulatory Visit: Payer: Self-pay

## 2017-06-24 ENCOUNTER — Encounter (HOSPITAL_COMMUNITY): Payer: Self-pay

## 2017-06-24 DIAGNOSIS — I130 Hypertensive heart and chronic kidney disease with heart failure and stage 1 through stage 4 chronic kidney disease, or unspecified chronic kidney disease: Secondary | ICD-10-CM | POA: Diagnosis not present

## 2017-06-24 DIAGNOSIS — Z79899 Other long term (current) drug therapy: Secondary | ICD-10-CM | POA: Insufficient documentation

## 2017-06-24 DIAGNOSIS — Z87891 Personal history of nicotine dependence: Secondary | ICD-10-CM | POA: Insufficient documentation

## 2017-06-24 DIAGNOSIS — I5043 Acute on chronic combined systolic (congestive) and diastolic (congestive) heart failure: Secondary | ICD-10-CM | POA: Insufficient documentation

## 2017-06-24 DIAGNOSIS — Z7984 Long term (current) use of oral hypoglycemic drugs: Secondary | ICD-10-CM | POA: Diagnosis not present

## 2017-06-24 DIAGNOSIS — E1122 Type 2 diabetes mellitus with diabetic chronic kidney disease: Secondary | ICD-10-CM | POA: Insufficient documentation

## 2017-06-24 DIAGNOSIS — Y829 Unspecified medical devices associated with adverse incidents: Secondary | ICD-10-CM | POA: Insufficient documentation

## 2017-06-24 DIAGNOSIS — T829XXA Unspecified complication of cardiac and vascular prosthetic device, implant and graft, initial encounter: Secondary | ICD-10-CM

## 2017-06-24 DIAGNOSIS — N184 Chronic kidney disease, stage 4 (severe): Secondary | ICD-10-CM | POA: Diagnosis not present

## 2017-06-24 DIAGNOSIS — T82590A Other mechanical complication of surgically created arteriovenous fistula, initial encounter: Secondary | ICD-10-CM | POA: Insufficient documentation

## 2017-06-24 LAB — BASIC METABOLIC PANEL
ANION GAP: 11 (ref 5–15)
BUN: 59 mg/dL — AB (ref 6–20)
CALCIUM: 9.8 mg/dL (ref 8.9–10.3)
CO2: 31 mmol/L (ref 22–32)
Chloride: 97 mmol/L — ABNORMAL LOW (ref 101–111)
Creatinine, Ser: 7.47 mg/dL — ABNORMAL HIGH (ref 0.61–1.24)
GFR calc Af Amer: 8 mL/min — ABNORMAL LOW (ref >60)
GFR calc non Af Amer: 7 mL/min — ABNORMAL LOW (ref >60)
GLUCOSE: 147 mg/dL — AB (ref 65–99)
Potassium: 4.3 mmol/L (ref 3.5–5.1)
Sodium: 139 mmol/L (ref 135–145)

## 2017-06-24 LAB — CBC
HEMATOCRIT: 34.9 % — AB (ref 39.0–52.0)
Hemoglobin: 11.6 g/dL — ABNORMAL LOW (ref 13.0–17.0)
MCH: 29.6 pg (ref 26.0–34.0)
MCHC: 33.2 g/dL (ref 30.0–36.0)
MCV: 89 fL (ref 78.0–100.0)
Platelets: 203 10*3/uL (ref 150–400)
RBC: 3.92 MIL/uL — ABNORMAL LOW (ref 4.22–5.81)
RDW: 13.4 % (ref 11.5–15.5)
WBC: 6.5 10*3/uL (ref 4.0–10.5)

## 2017-06-24 NOTE — ED Notes (Signed)
Renal PA called to state that the pts labs were okay and he is allowed to be discharged

## 2017-06-24 NOTE — ED Provider Notes (Signed)
[facility] EMERGENCY DEPARTMENT Provider Note   CSN: [id number] Arrival date & time: 06/24/17  1510     History   Chief Complaint Chief Complaint  Patient presents with  . abnormal labs    HPI Edgar Brewer is a 57 y.o. male.  57 yo M with a chief complaint of a complication of his left AV fistula.  The patient suffered a fall from a ladder about 4 days ago and since then has not been able to use his fistula.  He is scheduled for a fistulogram on Monday.  His nephrologist called him today and told him to come to the ED to have labs drawn to make sure he did not need dialysis.  Patient denies any other complaint.  No continued issue from the fall.  The history is provided by the patient.  Illness  This is a new problem. The current episode started more than 2 days ago. The problem occurs constantly. The problem has not changed since onset.Pertinent negatives include no chest pain, no abdominal pain, no headaches and no shortness of breath. Nothing aggravates the symptoms. Nothing relieves the symptoms. He has tried nothing for the symptoms. The treatment provided no relief.    Past Medical History:  Diagnosis Date  . Acute on chronic combined systolic and diastolic CHF, NYHA class 1 ([place]) 01/13/2015  . Arthritis   . Chronic kidney disease    nephrologist- Dr Lorrene Reid  . Diabetes mellitus without complication ([place])    Type II  . GERD (gastroesophageal reflux disease)    at times  . History of kidney stones   . Hypertension   . Neuropathy   . Pulmonary hypertension (HCC)    PA peak pressure 41 mmHg 03/04/16 echo  . Varicose veins   . Varicose veins of lower extremities with complications 70/78/6754    Patient Active Problem List   Diagnosis Date Noted  . AKI (acute kidney injury) ([place]) 07/06/2016  . Chest pain at rest 07/06/2016  . Chest pain 07/06/2016  . CKD (chronic kidney disease), stage IV ([place]) 01/14/2015  . Diabetes mellitus type II, non insulin  dependent ([place]) 01/14/2015  . Obesity 01/14/2015  . Accelerated hypertension 01/14/2015  . Hypokalemia 01/14/2015  . Anemia of chronic kidney failure 01/14/2015  . Chronic diastolic CHF (congestive heart failure) ([place]) 01/13/2015  . Hematuria 01/13/2015  . Varicose veins of lower extremities with complications 49/20/1007  . Chronic venous insufficiency 10/22/2014    Past Surgical History:  Procedure Laterality Date  . AV FISTULA PLACEMENT Left 06/01/2016   Procedure: ARTERIOVENOUS (AV) FISTULA CREATION;  Surgeon: [name], MD;  Location: [facility] OR;  Service: Vascular;  Laterality: Left;  . COLONOSCOPY W/ POLYPECTOMY    . ENDOVENOUS ABLATION SAPHENOUS VEIN W/ LASER Left 07-23-2015   endovenous laser ablation left greater saphenous vein by [name]  . ENDOVENOUS ABLATION SAPHENOUS VEIN W/ LASER Right 10/15/2015   EVLA right greater saphenous vein by [name]        Home Medications    Prior to Admission medications   Medication Sig Start Date End Date Taking? Authorizing Provider  carvedilol (COREG) 25 MG tablet Take 25 mg by mouth 2 (two) times daily with a meal.    [provider]  cholecalciferol (VITAMIN D) 1000 units tablet Take 1,000 Units by mouth daily.    [provider]  furosemide (LASIX) 40 MG tablet Take 40 mg by mouth daily.  06/09/16   [provider]  glipiZIDE (GLUCOTROL) 5 MG tablet Take 5 mg by mouth daily before breakfast.     [provider]  hydrALAZINE (APRESOLINE) 25 MG tablet Take 1 tablet (25 mg total) by mouth 3 (three) times daily. 06/01/16 11/07/16  Gabriel Earing, PA-C  traMADol (ULTRAM) 50 MG tablet Take 1 tablet (50 mg total) by mouth every 6 (six) hours as needed. 06/01/16   Rhyne, Hulen Shouts, PA-C  vitamin C (ASCORBIC ACID) 500 MG tablet Take 1,000 mg by mouth at bedtime.     [provider]    Family History Family History  Problem Relation Age of Onset  . Cancer Father     Social  History Social History   Tobacco Use  . Smoking status: Former Smoker    Packs/day: 1.00    Years: 45.00    Pack years: 45.00    Types: Cigarettes    Last attempt to quit: 01/13/2015    Years since quitting: 2.4  . Smokeless tobacco: Never Used  Substance Use Topics  . Alcohol use: Yes    Alcohol/week: 0.6 oz    Types: 1 Glasses of wine per week    Comment: rare occassion  . Drug use: No     Allergies   Codeine   Review of Systems Review of Systems  Constitutional: Negative for chills and fever.  HENT: Negative for congestion and facial swelling.   Eyes: Negative for discharge and visual disturbance.  Respiratory: Negative for shortness of breath.   Cardiovascular: Negative for chest pain and palpitations.  Gastrointestinal: Negative for abdominal pain, diarrhea and vomiting.  Musculoskeletal: Negative for arthralgias and myalgias.  Skin: Negative for color change and rash.  Neurological: Negative for tremors, syncope and headaches.  Psychiatric/Behavioral: Negative for confusion and dysphoric mood.     Physical Exam Updated Vital Signs BP (!) 150/85 (BP Location: Right Arm)   Pulse 72   Temp 97.6 F (36.4 C) (Oral)   Resp 18   SpO2 99%   Physical Exam  Constitutional: He is oriented to person, place, and time. He appears well-developed and well-nourished.  HENT:  Head: Normocephalic and atraumatic.  Eyes: Pupils are equal, round, and reactive to light. EOM are normal.  Neck: Normal range of motion. Neck supple. No JVD present.  Cardiovascular: Normal rate and regular rhythm. Exam reveals no gallop and no friction rub.  No murmur heard. Pulmonary/Chest: No respiratory distress. He has no wheezes.  Abdominal: He exhibits no distension. There is no rebound and no guarding.  Musculoskeletal: Normal range of motion.  No noted thrill to the left AV fistula  Neurological: He is alert and oriented to person, place, and time.  Skin: No rash noted. No pallor.   Psychiatric: He has a normal mood and affect. His behavior is normal.  Nursing note and vitals reviewed.    ED Treatments / Results  Labs (all labs ordered are listed, but only abnormal results are displayed) Labs Reviewed  CBC - Abnormal; Notable for the following components:      Result Value   RBC 3.92 (*)    Hemoglobin 11.6 (*)    HCT 34.9 (*)    All other components within normal limits  BASIC METABOLIC PANEL - Abnormal; Notable for the following components:   Chloride 97 (*)    Glucose, Bld 147 (*)    BUN 59 (*)    Creatinine, Ser 7.47 (*)    GFR calc non Af Amer 7 (*)    GFR calc Af  Amer 8 (*)    All other components within normal limits    EKG None  Radiology No results found.  Procedures Procedures (including critical care time)  Medications Ordered in ED Medications - No data to display   Initial Impression / Assessment and Plan / ED Course  I have reviewed the triage vital signs and the nursing notes.  Pertinent labs & imaging results that were available during my care of the patient were reviewed by me and considered in my medical decision making (see chart for details).     57 yo M with trauma to his AV fistula.  Patient has not been able to get dialysis since.  Was sent by his nephrologist for labs.  His potassium is 4.3.  He is not fluid overloaded on my exam.  Will discharge home.  7:17 PM:  I have discussed the diagnosis/risks/treatment options with the patient and family and believe the pt to be eligible for discharge home to follow-up with Nephrology. We also discussed returning to the ED immediately if new or worsening sx occur. We discussed the sx which are most concerning (e.g., sudden worsening pain, fever, inability to tolerate by mouth) that necessitate immediate return. Medications administered to the patient during their visit and any new prescriptions provided to the patient are listed below.  Medications given during this  visit Medications - No data to display    The patient appears reasonably screen and/or stabilized for discharge and I doubt any other medical condition or other [facility] requiring further screening, evaluation, or treatment in the ED at this time prior to discharge.    Final Clinical Impressions(s) / ED Diagnoses   Final diagnoses:  Complication of arteriovenous dialysis fistula, initial encounter    ED Discharge Orders    None       Deno Etienne, DO 06/24/17 0254

## 2017-06-24 NOTE — ED Triage Notes (Signed)
Last dialysis 06-21-17.  Pt sent here from dialysis.  Dialysis graft in left arm not working.  Scheduled for declot of fistula 06-26-17 @ 8am.   Pt sent to ED for BMP, call results to [phone], [employer].  Avelina Laine PAC.

## 2017-06-24 NOTE — ED Triage Notes (Signed)
pts wife also reporting that pt fell off ladder 06-22-17 onto left arm and back.  Was seen and evaluated at u/c for same.

## 2017-07-07 ENCOUNTER — Other Ambulatory Visit: Payer: Self-pay

## 2017-07-07 DIAGNOSIS — T82868A Thrombosis of vascular prosthetic devices, implants and grafts, initial encounter: Secondary | ICD-10-CM

## 2017-07-18 ENCOUNTER — Ambulatory Visit (INDEPENDENT_AMBULATORY_CARE_PROVIDER_SITE_OTHER)
Admission: RE | Admit: 2017-07-18 | Discharge: 2017-07-18 | Disposition: A | Discharge status: Home / Self Care | Payer: Medicare Other | Source: Ambulatory Visit | Attending: Family | Admitting: Family

## 2017-07-18 ENCOUNTER — Ambulatory Visit (HOSPITAL_COMMUNITY)
Admission: RE | Admit: 2017-07-18 | Discharge: 2017-07-18 | Disposition: A | Discharge status: Home / Self Care | Payer: Medicare Other | Source: Ambulatory Visit | Attending: Family | Admitting: Family

## 2017-07-18 DIAGNOSIS — X58XXXA Exposure to other specified factors, initial encounter: Secondary | ICD-10-CM | POA: Diagnosis not present

## 2017-07-18 DIAGNOSIS — T82868A Thrombosis of vascular prosthetic devices, implants and grafts, initial encounter: Secondary | ICD-10-CM | POA: Diagnosis not present

## 2017-07-25 ENCOUNTER — Other Ambulatory Visit: Payer: Self-pay | Admitting: *Deleted

## 2017-07-25 ENCOUNTER — Ambulatory Visit (INDEPENDENT_AMBULATORY_CARE_PROVIDER_SITE_OTHER): Payer: Medicare Other | Admitting: Vascular Surgery

## 2017-07-25 ENCOUNTER — Encounter: Payer: Self-pay | Admitting: *Deleted

## 2017-07-25 ENCOUNTER — Encounter: Payer: Self-pay | Admitting: Vascular Surgery

## 2017-07-25 ENCOUNTER — Other Ambulatory Visit: Payer: Self-pay

## 2017-07-25 VITALS — BP 107/69 | HR 84 | Resp 20 | Ht 68.0 in | Wt 200.0 lb

## 2017-07-25 DIAGNOSIS — N186 End stage renal disease: Secondary | ICD-10-CM

## 2017-07-25 DIAGNOSIS — Z992 Dependence on renal dialysis: Secondary | ICD-10-CM

## 2017-07-25 NOTE — H&P (View-Only) (Signed)
[facility] and [facility]in Specialist of [facility]  Patient name: Edgar Brewer MRN: [id number] DOB: [birth year] Sex: male  REASON FOR VISIT: Evaluation for new access for hemodialysis  HPI: Edgar Brewer is a 57 y.o. male here today for evaluation for new access for hemodialysis.  He is known to me from prior left radiocephalic fistula in May 3009.  He is here today with his mother.  He has reportedly good access initially with this but had a failure recently and underwent attempted percutaneous treatment of this which was unsuccessful.  He had a right IJ catheter placed at [facility] at that time.  He is right-handed.  Past Medical History:  Diagnosis Date  . Acute on chronic combined systolic and diastolic CHF, NYHA class 1 ([place]) 01/13/2015  . Arthritis   . Chronic kidney disease    nephrologist- Dr Lorrene Reid  . Diabetes mellitus without complication ([place])    Type II  . GERD (gastroesophageal reflux disease)    at times  . History of kidney stones   . Hypertension   . Neuropathy   . Pulmonary hypertension (HCC)    PA peak pressure 41 mmHg 03/04/16 echo  . Varicose veins   . Varicose veins of lower extremities with complications 23/30/0762    Family History  Problem Relation Age of Onset  . Cancer Father     SOCIAL HISTORY: Social History   Tobacco Use  . Smoking status: Former Smoker    Packs/day: 1.00    Years: 45.00    Pack years: 45.00    Types: Cigarettes    Last attempt to quit: 01/13/2015    Years since quitting: 2.5  . Smokeless tobacco: Never Used  Substance Use Topics  . Alcohol use: Yes    Alcohol/week: 0.6 oz    Types: 1 Glasses of wine per week    Comment: rare occassion    Allergies  Allergen Reactions  . Codeine Hives and Swelling    Swelling all over body     Current Outpatient Medications  Medication Sig Dispense Refill  . carvedilol (COREG) 25 MG tablet Take 25 mg by mouth 2 (two) times daily with a meal.    .  cholecalciferol (VITAMIN D) 1000 units tablet Take 1,000 Units by mouth daily.    . furosemide (LASIX) 40 MG tablet Take 40 mg by mouth daily.   0  . glipiZIDE (GLUCOTROL) 5 MG tablet Take 5 mg by mouth daily before breakfast.     . traMADol (ULTRAM) 50 MG tablet Take 1 tablet (50 mg total) by mouth every 6 (six) hours as needed. 8 tablet 0  . vitamin C (ASCORBIC ACID) 500 MG tablet Take 1,000 mg by mouth at bedtime.     . hydrALAZINE (APRESOLINE) 25 MG tablet Take 1 tablet (25 mg total) by mouth 3 (three) times daily.     No current facility-administered medications for this visit.     REVIEW OF SYSTEMS:  [X]  denotes positive finding, [ ]  denotes negative finding Cardiac  Comments:  Chest pain or chest pressure:    Shortness of breath upon exertion:    Short of breath when lying flat:    Irregular heart rhythm:        Vascular    Pain in calf, thigh, or hip brought on by ambulation:    Pain in feet at night that wakes you up from your sleep:     Blood clot in your veins:    Leg swelling:  PHYSICAL EXAM: Vitals:   07/25/17 0915  BP: 107/69  Pulse: 84  Resp: 20  SpO2: 96%  Weight: 200 lb (90.7 kg)  Height: 5\' 8"  (1.727 m)    GENERAL: The patient is a well-nourished male, in no acute distress. The vital signs are documented above. CARDIOVASCULAR: 2+ radial pulse on the right.  He does have a pulse and a remnant of patent cephalic vein at his left wrist.  Does have a normal brachial poise at the antecubital space PULMONARY: There is good air exchange  MUSCULOSKELETAL: There are no major deformities or cyanosis. NEUROLOGIC: No focal weakness or paresthesias are detected. SKIN: There are no ulcers or rashes noted. PSYCHIATRIC: The patient has a normal affect.  DATA:  I imaged his left arm with SonoSite ultrasound.  This does show good caliber cephalic vein at the antecubital space extending throughout his upper arm.  MEDICAL ISSUES: I have recommended a left  brachiocephalic AV fistula.  Explained to the patient and his mother that he has had some dilatation from this with his year-long patency of a radiocephalic fistula.  Explained that this would require a 2 to 3 months for maturation and also the possibility of non-maturation.  He has hemodialysis on Monday Wednesday and Friday so will be scheduled on a Tuesday or Thursday.  Explained that I am in the office on Tuesdays and Thursday so 1 of my partners will be doing the procedure.    [name], MD FACS [facility] Tel [phone] Pager [phone]

## 2017-07-25 NOTE — Progress Notes (Signed)
Vascular and Vein Specialist of [facility]  Patient name: Edgar Brewer MRN: [id number] DOB: 12-13-1960 Sex: male  REASON FOR VISIT: Evaluation for new access for hemodialysis  HPI: Edgar Brewer is a 57 y.o. male here today for evaluation for new access for hemodialysis.  He is known to me from prior left radiocephalic fistula in May 3151.  He is here today with his mother.  He has reportedly good access initially with this but had a failure recently and underwent attempted percutaneous treatment of this which was unsuccessful.  He had a right IJ catheter placed at [facility] at that time.  He is right-handed.  Past Medical History:  Diagnosis Date  . Acute on chronic combined systolic and diastolic CHF, NYHA class 1 ([place]) 01/13/2015  . Arthritis   . Chronic kidney disease    nephrologist- Dr Lorrene Reid  . Diabetes mellitus without complication ([place])    Type II  . GERD (gastroesophageal reflux disease)    at times  . History of kidney stones   . Hypertension   . Neuropathy   . Pulmonary hypertension (HCC)    PA peak pressure 41 mmHg 03/04/16 echo  . Varicose veins   . Varicose veins of lower extremities with complications 76/16/0737    Family History  Problem Relation Age of Onset  . Cancer Father     SOCIAL HISTORY: Social History   Tobacco Use  . Smoking status: Former Smoker    Packs/day: 1.00    Years: 45.00    Pack years: 45.00    Types: Cigarettes    Last attempt to quit: 01/13/2015    Years since quitting: 2.5  . Smokeless tobacco: Never Used  Substance Use Topics  . Alcohol use: Yes    Alcohol/week: 0.6 oz    Types: 1 Glasses of wine per week    Comment: rare occassion    Allergies  Allergen Reactions  . Codeine Hives and Swelling    Swelling all over body     Current Outpatient Medications  Medication Sig Dispense Refill  . carvedilol (COREG) 25 MG tablet Take 25 mg by mouth 2 (two) times daily with a meal.    .  cholecalciferol (VITAMIN D) 1000 units tablet Take 1,000 Units by mouth daily.    . furosemide (LASIX) 40 MG tablet Take 40 mg by mouth daily.   0  . glipiZIDE (GLUCOTROL) 5 MG tablet Take 5 mg by mouth daily before breakfast.     . traMADol (ULTRAM) 50 MG tablet Take 1 tablet (50 mg total) by mouth every 6 (six) hours as needed. 8 tablet 0  . vitamin C (ASCORBIC ACID) 500 MG tablet Take 1,000 mg by mouth at bedtime.     . hydrALAZINE (APRESOLINE) 25 MG tablet Take 1 tablet (25 mg total) by mouth 3 (three) times daily.     No current facility-administered medications for this visit.     REVIEW OF SYSTEMS:  [X]  denotes positive finding, [ ]  denotes negative finding Cardiac  Comments:  Chest pain or chest pressure:    Shortness of breath upon exertion:    Short of breath when lying flat:    Irregular heart rhythm:        Vascular    Pain in calf, thigh, or hip brought on by ambulation:    Pain in feet at night that wakes you up from your sleep:     Blood clot in your veins:    Leg swelling:  PHYSICAL EXAM: Vitals:   07/25/17 0915  BP: 107/69  Pulse: 84  Resp: 20  SpO2: 96%  Weight: 200 lb (90.7 kg)  Height: 5\' 8"  (1.727 m)    GENERAL: The patient is a well-nourished male, in no acute distress. The vital signs are documented above. CARDIOVASCULAR: 2+ radial pulse on the right.  He does have a pulse and a remnant of patent cephalic vein at his left wrist.  Does have a normal brachial poise at the antecubital space PULMONARY: There is good air exchange  MUSCULOSKELETAL: There are no major deformities or cyanosis. NEUROLOGIC: No focal weakness or paresthesias are detected. SKIN: There are no ulcers or rashes noted. PSYCHIATRIC: The patient has a normal affect.  DATA:  I imaged his left arm with SonoSite ultrasound.  This does show good caliber cephalic vein at the antecubital space extending throughout his upper arm.  MEDICAL ISSUES: I have recommended a left  brachiocephalic AV fistula.  Explained to the patient and his mother that he has had some dilatation from this with his year-long patency of a radiocephalic fistula.  Explained that this would require a 2 to 3 months for maturation and also the possibility of non-maturation.  He has hemodialysis on Monday Wednesday and Friday so will be scheduled on a Tuesday or Thursday.  Explained that I am in the office on Tuesdays and Thursday so 1 of my partners will be doing the procedure.    [name], MD FACS Vascular and Vein Specialists of [facility] Tel [phone] Pager [phone]

## 2017-07-31 ENCOUNTER — Encounter: Payer: Self-pay | Admitting: Nephrology

## 2017-08-01 ENCOUNTER — Encounter (HOSPITAL_COMMUNITY): Payer: Self-pay | Admitting: *Deleted

## 2017-08-01 ENCOUNTER — Other Ambulatory Visit: Payer: Self-pay

## 2017-08-10 ENCOUNTER — Encounter (HOSPITAL_COMMUNITY)
Admission: RE | Disposition: A | Discharge status: Home / Self Care | Payer: Self-pay | Source: Ambulatory Visit | Attending: Vascular Surgery

## 2017-08-10 ENCOUNTER — Ambulatory Visit (HOSPITAL_COMMUNITY): Payer: Medicare Other | Admitting: Anesthesiology

## 2017-08-10 ENCOUNTER — Encounter (HOSPITAL_COMMUNITY): Payer: Self-pay | Admitting: Anesthesiology

## 2017-08-10 ENCOUNTER — Other Ambulatory Visit: Payer: Self-pay

## 2017-08-10 ENCOUNTER — Telehealth: Payer: Self-pay | Admitting: Vascular Surgery

## 2017-08-10 ENCOUNTER — Ambulatory Visit (HOSPITAL_COMMUNITY)
Admission: RE | Admit: 2017-08-10 | Discharge: 2017-08-10 | Disposition: A | Discharge status: Home / Self Care | Payer: Medicare Other | Source: Ambulatory Visit | Attending: Vascular Surgery | Admitting: Vascular Surgery

## 2017-08-10 DIAGNOSIS — N186 End stage renal disease: Secondary | ICD-10-CM

## 2017-08-10 DIAGNOSIS — M1990 Unspecified osteoarthritis, unspecified site: Secondary | ICD-10-CM | POA: Diagnosis not present

## 2017-08-10 DIAGNOSIS — E1122 Type 2 diabetes mellitus with diabetic chronic kidney disease: Secondary | ICD-10-CM | POA: Diagnosis not present

## 2017-08-10 DIAGNOSIS — Z48812 Encounter for surgical aftercare following surgery on the circulatory system: Secondary | ICD-10-CM

## 2017-08-10 DIAGNOSIS — Z992 Dependence on renal dialysis: Secondary | ICD-10-CM | POA: Insufficient documentation

## 2017-08-10 DIAGNOSIS — I272 Pulmonary hypertension, unspecified: Secondary | ICD-10-CM | POA: Diagnosis not present

## 2017-08-10 DIAGNOSIS — Z885 Allergy status to narcotic agent status: Secondary | ICD-10-CM | POA: Diagnosis not present

## 2017-08-10 DIAGNOSIS — Z87442 Personal history of urinary calculi: Secondary | ICD-10-CM | POA: Insufficient documentation

## 2017-08-10 DIAGNOSIS — Z7984 Long term (current) use of oral hypoglycemic drugs: Secondary | ICD-10-CM | POA: Insufficient documentation

## 2017-08-10 DIAGNOSIS — I132 Hypertensive heart and chronic kidney disease with heart failure and with stage 5 chronic kidney disease, or end stage renal disease: Secondary | ICD-10-CM | POA: Insufficient documentation

## 2017-08-10 DIAGNOSIS — Z87891 Personal history of nicotine dependence: Secondary | ICD-10-CM | POA: Diagnosis not present

## 2017-08-10 DIAGNOSIS — K219 Gastro-esophageal reflux disease without esophagitis: Secondary | ICD-10-CM | POA: Insufficient documentation

## 2017-08-10 DIAGNOSIS — E1140 Type 2 diabetes mellitus with diabetic neuropathy, unspecified: Secondary | ICD-10-CM | POA: Insufficient documentation

## 2017-08-10 DIAGNOSIS — Z79899 Other long term (current) drug therapy: Secondary | ICD-10-CM | POA: Diagnosis not present

## 2017-08-10 DIAGNOSIS — I5043 Acute on chronic combined systolic (congestive) and diastolic (congestive) heart failure: Secondary | ICD-10-CM | POA: Diagnosis not present

## 2017-08-10 DIAGNOSIS — N185 Chronic kidney disease, stage 5: Secondary | ICD-10-CM

## 2017-08-10 HISTORY — DX: Unspecified intracranial injury with loss of consciousness of unspecified duration, initial encounter: S06.9X9A

## 2017-08-10 HISTORY — PX: AV FISTULA PLACEMENT: SHX1204

## 2017-08-10 LAB — POCT I-STAT 4, (NA,K, GLUC, HGB,HCT)
Glucose, Bld: 179 mg/dL — ABNORMAL HIGH (ref 70–99)
HCT: 34 % — ABNORMAL LOW (ref 39.0–52.0)
HEMOGLOBIN: 11.6 g/dL — AB (ref 13.0–17.0)
Potassium: 3.8 mmol/L (ref 3.5–5.1)
SODIUM: 134 mmol/L — AB (ref 135–145)

## 2017-08-10 LAB — HEMOGLOBIN A1C
HEMOGLOBIN A1C: 6.9 % — AB (ref 4.8–5.6)
Mean Plasma Glucose: 151.33 mg/dL

## 2017-08-10 LAB — GLUCOSE, CAPILLARY
GLUCOSE-CAPILLARY: 179 mg/dL — AB (ref 70–99)
Glucose-Capillary: 178 mg/dL — ABNORMAL HIGH (ref 70–99)

## 2017-08-10 SURGERY — ARTERIOVENOUS (AV) FISTULA CREATION
Anesthesia: Monitor Anesthesia Care | Site: Arm Upper | Laterality: Left

## 2017-08-10 MED ORDER — HEPARIN SODIUM (PORCINE) 1000 UNIT/ML IJ SOLN
INTRAMUSCULAR | Status: DC | PRN
  Administered 2017-08-10: 7000 [IU] via INTRAVENOUS

## 2017-08-10 MED ORDER — LIDOCAINE-EPINEPHRINE (PF) 1 %-1:200000 IJ SOLN
INTRAMUSCULAR | Status: DC | PRN
  Administered 2017-08-10: 15 mL

## 2017-08-10 MED ORDER — FENTANYL CITRATE (PF) 100 MCG/2ML IJ SOLN
INTRAMUSCULAR | Status: DC | PRN
  Administered 2017-08-10 (×3): 25 ug via INTRAVENOUS

## 2017-08-10 MED ORDER — ONDANSETRON HCL 4 MG/2ML IJ SOLN
INTRAMUSCULAR | Status: DC | PRN
  Administered 2017-08-10: 4 mg via INTRAVENOUS

## 2017-08-10 MED ORDER — FENTANYL CITRATE (PF) 250 MCG/5ML IJ SOLN
INTRAMUSCULAR | Status: AC
  Filled 2017-08-10: qty 5

## 2017-08-10 MED ORDER — MIDAZOLAM HCL 2 MG/2ML IJ SOLN
INTRAMUSCULAR | Status: AC
  Filled 2017-08-10: qty 2

## 2017-08-10 MED ORDER — SODIUM CHLORIDE 0.9 % IV SOLN: INTRAVENOUS | Status: DC

## 2017-08-10 MED ORDER — SODIUM CHLORIDE 0.9 % IV SOLN
INTRAVENOUS | Status: AC
  Filled 2017-08-10: qty 1.2

## 2017-08-10 MED ORDER — PAPAVERINE HCL 30 MG/ML IJ SOLN
INTRAMUSCULAR | Status: AC
  Filled 2017-08-10: qty 2

## 2017-08-10 MED ORDER — OXYCODONE HCL 5 MG PO TABS: 5.0000 mg | ORAL_TABLET | ORAL | 0 refills | Status: DC | PRN

## 2017-08-10 MED ORDER — LIDOCAINE HCL (PF) 1 % IJ SOLN
INTRAMUSCULAR | Status: AC
  Filled 2017-08-10: qty 30

## 2017-08-10 MED ORDER — 0.9 % SODIUM CHLORIDE (POUR BTL) OPTIME
TOPICAL | Status: DC | PRN
  Administered 2017-08-10: 1000 mL

## 2017-08-10 MED ORDER — PROTAMINE SULFATE 10 MG/ML IV SOLN
INTRAVENOUS | Status: DC | PRN
  Administered 2017-08-10: 30 mg via INTRAVENOUS
  Administered 2017-08-10: 10 mg via INTRAVENOUS

## 2017-08-10 MED ORDER — SODIUM CHLORIDE 0.9 % IV SOLN
INTRAVENOUS | Status: DC | PRN
  Administered 2017-08-10: 07:00:00 via INTRAVENOUS

## 2017-08-10 MED ORDER — CHLORHEXIDINE GLUCONATE 4 % EX LIQD: 60.0000 mL | Freq: Once | CUTANEOUS | Status: DC

## 2017-08-10 MED ORDER — PROPOFOL 500 MG/50ML IV EMUL
INTRAVENOUS | Status: DC | PRN
  Administered 2017-08-10: 75 ug/kg/min via INTRAVENOUS

## 2017-08-10 MED ORDER — SODIUM CHLORIDE 0.9 % IV SOLN
INTRAVENOUS | Status: DC | PRN
  Administered 2017-08-10: 08:00:00

## 2017-08-10 MED ORDER — LIDOCAINE-EPINEPHRINE (PF) 1 %-1:200000 IJ SOLN
INTRAMUSCULAR | Status: AC
  Filled 2017-08-10: qty 30

## 2017-08-10 MED ORDER — SODIUM CHLORIDE 0.9 % IV SOLN
INTRAVENOUS | Status: DC | PRN
  Administered 2017-08-10: 30 ug/min via INTRAVENOUS

## 2017-08-10 MED ORDER — PAPAVERINE HCL 30 MG/ML IJ SOLN
INTRAMUSCULAR | Status: DC | PRN
  Administered 2017-08-10: 60 mg

## 2017-08-10 MED ORDER — MIDAZOLAM HCL 5 MG/5ML IJ SOLN
INTRAMUSCULAR | Status: DC | PRN
  Administered 2017-08-10 (×2): 1 mg via INTRAVENOUS

## 2017-08-10 MED ORDER — CEFAZOLIN SODIUM-DEXTROSE 2-4 GM/100ML-% IV SOLN
2.0000 g | INTRAVENOUS | Status: AC
  Administered 2017-08-10: 2 g via INTRAVENOUS
  Filled 2017-08-10: qty 100

## 2017-08-10 MED ORDER — PROPOFOL 10 MG/ML IV BOLUS
INTRAVENOUS | Status: AC
  Filled 2017-08-10: qty 20

## 2017-08-10 SURGICAL SUPPLY — 35 items
ADH SKN CLS APL DERMABOND .7 (GAUZE/BANDAGES/DRESSINGS) ×1
ARMBAND PINK RESTRICT EXTREMIT (MISCELLANEOUS) ×6 IMPLANT
CANISTER SUCT 3000ML PPV (MISCELLANEOUS) ×3 IMPLANT
CANNULA VESSEL 3MM 2 BLNT TIP (CANNULA) ×5 IMPLANT
CLIP VESOCCLUDE MED 6/CT (CLIP) ×3 IMPLANT
CLIP VESOCCLUDE SM WIDE 6/CT (CLIP) ×7 IMPLANT
COVER PROBE W GEL 5X96 (DRAPES) IMPLANT
DECANTER SPIKE VIAL GLASS SM (MISCELLANEOUS) ×3 IMPLANT
DERMABOND ADVANCED (GAUZE/BANDAGES/DRESSINGS) ×2
DERMABOND ADVANCED .7 DNX12 (GAUZE/BANDAGES/DRESSINGS) ×1 IMPLANT
ELECT REM PT RETURN 9FT ADLT (ELECTROSURGICAL) ×3
ELECTRODE REM PT RTRN 9FT ADLT (ELECTROSURGICAL) ×1 IMPLANT
GLOVE BIO SURGEON STRL SZ7.5 (GLOVE) ×3 IMPLANT
GLOVE BIOGEL PI IND STRL 7.0 (GLOVE) IMPLANT
GLOVE BIOGEL PI IND STRL 8 (GLOVE) ×1 IMPLANT
GLOVE BIOGEL PI INDICATOR 7.0 (GLOVE) ×2
GLOVE BIOGEL PI INDICATOR 8 (GLOVE) ×2
GLOVE ECLIPSE 6.5 STRL STRAW (GLOVE) ×2 IMPLANT
GOWN STRL REUS W/ TWL LRG LVL3 (GOWN DISPOSABLE) ×3 IMPLANT
GOWN STRL REUS W/TWL LRG LVL3 (GOWN DISPOSABLE) ×12
KIT BASIN OR (CUSTOM PROCEDURE TRAY) ×3 IMPLANT
KIT TURNOVER KIT B (KITS) ×3 IMPLANT
NS IRRIG 1000ML POUR BTL (IV SOLUTION) ×3 IMPLANT
PACK CV ACCESS (CUSTOM PROCEDURE TRAY) ×3 IMPLANT
PAD ARMBOARD 7.5X6 YLW CONV (MISCELLANEOUS) ×6 IMPLANT
SPONGE SURGIFOAM ABS GEL 100 (HEMOSTASIS) IMPLANT
SUT PROLENE 6 0 BV (SUTURE) ×5 IMPLANT
SUT VIC AB 3-0 SH 27 (SUTURE) ×6
SUT VIC AB 3-0 SH 27X BRD (SUTURE) ×1 IMPLANT
SUT VIC AB 4-0 PS2 18 (SUTURE) ×2 IMPLANT
SUT VICRYL 4-0 PS2 18IN ABS (SUTURE) ×5 IMPLANT
SYR 20CC LL (SYRINGE) ×2 IMPLANT
TOWEL GREEN STERILE (TOWEL DISPOSABLE) ×3 IMPLANT
UNDERPAD 30X30 (UNDERPADS AND DIAPERS) ×3 IMPLANT
WATER STERILE IRR 1000ML POUR (IV SOLUTION) ×3 IMPLANT

## 2017-08-10 NOTE — Interval H&P Note (Signed)
History and Physical Interval Note:  08/10/2017 6:59 AM  Edgar Brewer  has presented today for surgery, with the diagnosis of END STAGE RENAL DISEASE FOR HEMODIALYSIS ACCESS  The various methods of treatment have been discussed with the patient and family. After consideration of risks, benefits and other options for treatment, the patient has consented to  Procedure(s): ARTERIOVENOUS (AV) FISTULA CREATION LEFT UPPER EXTREMITY (Left) as a surgical intervention .  The patient's history has been reviewed, patient examined, no change in status, stable for surgery.  I have reviewed the patient's chart and labs.  Questions were answered to the patient's satisfaction.     Deitra Mayo

## 2017-08-10 NOTE — Anesthesia Preprocedure Evaluation (Addendum)
Anesthesia Evaluation  Patient identified by MRN, date of birth, ID band Patient awake    Reviewed: Allergy & Precautions, H&P , NPO status , Patient's Chart, lab work & pertinent test results  Airway Mallampati: III   Neck ROM: full    Dental  (+) Dental Advidsory Given, Teeth Intact   Pulmonary former smoker,    breath sounds clear to auscultation       Cardiovascular hypertension, + Peripheral Vascular Disease   Rhythm:Regular Rate:Normal     Neuro/Psych    GI/Hepatic GERD  Medicated and Controlled,  Endo/Other  diabetes  Renal/GU ESRF and DialysisRenal disease     Musculoskeletal  (+) Arthritis ,   Abdominal   Peds  Hematology  (+) anemia ,   Anesthesia Other Findings   Reproductive/Obstetrics                            Anesthesia Physical Anesthesia Plan  ASA: III  Anesthesia Plan: MAC   Post-op Pain Management:    Induction:   PONV Risk Score and Plan: Ondansetron, Propofol infusion and Midazolam  Airway Management Planned: Natural Airway and Simple Face Mask  Additional Equipment:   Intra-op Plan:   Post-operative Plan:   Informed Consent: I have reviewed the patients History and Physical, chart, labs and discussed the procedure including the risks, benefits and alternatives for the proposed anesthesia with the patient or authorized representative who has indicated his/her understanding and acceptance.   Dental Advisory Given  Plan Discussed with: CRNA and Anesthesiologist  Anesthesia Plan Comments:        Anesthesia Quick Evaluation

## 2017-08-10 NOTE — Anesthesia Procedure Notes (Signed)
Procedure Name: MAC Date/Time: 08/10/2017 7:32 AM Performed by: Neldon Newport, CRNA Pre-anesthesia Checklist: Patient identified, Emergency Drugs available, Suction available and Patient being monitored Oxygen Delivery Method: Simple face mask Placement Confirmation: positive ETCO2

## 2017-08-10 NOTE — Op Note (Signed)
    NAME: Edgar Brewer    MRN: [id number] DOB: [birth year]    DATE OF OPERATION: 08/10/2017  PREOP DIAGNOSIS:    End-stage renal disease  POSTOP DIAGNOSIS:    Same  PROCEDURE:    Left brachiocephalic AV fistula Ligation of competing branch  SURGEON: Judeth Cornfield. Scot Dock, MD, FACS  ASSIST: Arlee Muslim, PA  ANESTHESIA: Local with sedation  EBL: Minimal  INDICATIONS:    Edgar Brewer is a 57 y.o. male who presents for new access.  He is on dialysis.  He has a functioning catheter.  FINDINGS:   The cephalic vein was lateral and the brachial artery medial.  There was a long distance between the 2 and therefore had to mobilize a long length of the cephalic vein in order to reach the brachial artery.  In addition there was a large competing branch in the mid upper arm which I ligated.  TECHNIQUE:   The patient was taken to the operating room and I interrogated the cephalic vein with the SonoSite.  The vein was quite far lateral and there was also a large competing branch identified in the mid upper arm.  In order to adequately mobilized the vein I anticipated having to extend well below the antecubital space to reach the brachial artery.  After the skin was anesthetized, a longitudinal incision was made over the brachial artery.  The artery was dissected free and was good size.  A separate longitudinal incision was made over the cephalic vein after the skin was anesthetized.  The vein was fully mobilized and then made a separate incision below the antecubital crease after the skin was anesthetized in order to mobilize more cephalic vein so that I could reach the brachial artery.  The vein was ligated distally and irrigated with heparinized saline.  It was then brought through a tunnel for anastomosis to the brachial artery.  The patient was heparinized.  The brachial artery was clamped proximally distally and a longitudinal arteriotomy was made.  The vein was sewn into side to the  artery using continuous 6-0 Prolene suture.  At the completion was an excellent thrill in the fistula.  There was a good radial and ulnar signal with the Doppler and a palpable radial pulse.  After the skin was anesthetized a small incision was made over the large competing branch and this was ligated with a 3-0 silk tie.  The heparin was partially reversed with protamine.  The incision over the competing branch was closed with a 4-0 Vicryl.  The remaining incisions were closed with a deep layer of 3-0 Vicryl and the skin closed with 4-0 Vicryl.  Dermabond was applied.  The patient tolerated the procedure well was transferred to the recovery room in stable condition.  All needle and sponge counts were correct.  The  Deitra Mayo, MD, FACS Vascular and Vein Specialists of [facility]  DATE OF DICTATION:   08/10/2017

## 2017-08-10 NOTE — Transfer of Care (Signed)
Immediate Anesthesia Transfer of Care Note  Patient: Edgar Brewer  Procedure(s) Performed: BRACHIOCEPHALIC ARTERIOVENOUS (AV) FISTULA CREATION LEFT UPPER EXTREMITY (Left Arm Upper)  Patient Location: PACU  Anesthesia Type:MAC  Level of Consciousness: awake, alert  and oriented  Airway & Oxygen Therapy: Patient Spontanous Breathing and Patient connected to nasal cannula oxygen  Post-op Assessment: Report given to RN, Post -op Vital signs reviewed and stable and Patient moving all extremities X 4  Post vital signs: Reviewed and stable  Last Vitals:  Vitals Value Taken Time  BP    Temp    Pulse    Resp 17 08/10/2017  9:00 AM  SpO2    Vitals shown include unvalidated device data.  Last Pain:  Vitals:   08/10/17 0607  TempSrc: Oral         Complications: No apparent anesthesia complications

## 2017-08-10 NOTE — Telephone Encounter (Signed)
sch appt spk to pt 09/19/17 2pm Dialysis Duplex 09/20/17 830am p/o MD

## 2017-08-10 NOTE — Anesthesia Postprocedure Evaluation (Signed)
Anesthesia Post Note  Patient: Edgar Brewer  Procedure(s) Performed: BRACHIOCEPHALIC ARTERIOVENOUS (AV) FISTULA CREATION LEFT UPPER EXTREMITY (Left Arm Upper)     Patient location during evaluation: PACU Anesthesia Type: MAC Level of consciousness: awake and alert Pain management: pain level controlled Vital Signs Assessment: post-procedure vital signs reviewed and stable Respiratory status: spontaneous breathing, nonlabored ventilation, respiratory function stable and patient connected to nasal cannula oxygen Cardiovascular status: stable and blood pressure returned to baseline Postop Assessment: no apparent nausea or vomiting Anesthetic complications: no    Last Vitals:  Vitals:   08/10/17 0915 08/10/17 0928  BP: 108/63 120/65  Pulse: 78 80  Resp: 17 15  Temp:  36.9 C  SpO2: 92% 93%    Last Pain:  Vitals:   08/10/17 0607  TempSrc: Oral                 Tanicka Bisaillon COKER

## 2017-08-11 ENCOUNTER — Encounter (HOSPITAL_COMMUNITY): Payer: Self-pay | Admitting: Vascular Surgery

## 2017-09-19 ENCOUNTER — Ambulatory Visit (HOSPITAL_COMMUNITY)
Admission: RE | Admit: 2017-09-19 | Discharge: 2017-09-19 | Disposition: A | Discharge status: Home / Self Care | Payer: Medicare Other | Source: Ambulatory Visit | Attending: Vascular Surgery | Admitting: Vascular Surgery

## 2017-09-19 DIAGNOSIS — Z992 Dependence on renal dialysis: Secondary | ICD-10-CM | POA: Diagnosis not present

## 2017-09-19 DIAGNOSIS — N186 End stage renal disease: Secondary | ICD-10-CM | POA: Insufficient documentation

## 2017-09-19 DIAGNOSIS — Z48812 Encounter for surgical aftercare following surgery on the circulatory system: Secondary | ICD-10-CM | POA: Diagnosis not present

## 2017-09-20 ENCOUNTER — Encounter: Payer: Self-pay | Admitting: Vascular Surgery

## 2017-09-20 ENCOUNTER — Ambulatory Visit (INDEPENDENT_AMBULATORY_CARE_PROVIDER_SITE_OTHER): Payer: Medicare Other | Admitting: Vascular Surgery

## 2017-09-20 ENCOUNTER — Other Ambulatory Visit: Payer: Self-pay

## 2017-09-20 VITALS — BP 111/65 | HR 79 | Temp 97.2°F | Resp 20 | Ht 68.0 in | Wt 205.0 lb

## 2017-09-20 DIAGNOSIS — Z48812 Encounter for surgical aftercare following surgery on the circulatory system: Secondary | ICD-10-CM

## 2017-09-20 NOTE — Progress Notes (Signed)
   Patient name: Edgar Brewer MRN: [id number] DOB: Nov 09, 1960 Sex: male  REASON FOR VISIT:   Follow-up after left brachiocephalic AV fistula.  HPI:   Edgar Brewer is a pleasant 57 y.o. male who underwent a left brachiocephalic fistula on 2/42/3536.  He comes in for follow-up visit.  He has no specific complaints.  He had chronic paresthesias in his left hand which is not new.  He dialyzes at [facility] next door with his right IJ catheter.  Current Outpatient Medications  Medication Sig Dispense Refill  . b complex-vitamin c-folic acid (NEPHRO-VITE) 0.8 MG TABS tablet Take 1 tablet by mouth daily.    . ferric citrate (AURYXIA) 1 GM 210 MG(Fe) tablet Take 420 mg by mouth 3 (three) times daily with meals.    . gabapentin (NEURONTIN) 300 MG capsule Take 300 mg by mouth 2 (two) times daily.    Marland Kitchen glipiZIDE (GLUCOTROL) 5 MG tablet Take 10 mg by mouth 2 (two) times daily before a meal.     . JANUVIA 50 MG tablet Take 100 mg by mouth daily.  3  . lanthanum (FOSRENOL) 1000 MG chewable tablet Chew 1,000 mg by mouth 2 (two) times daily with a meal.    . sevelamer carbonate (RENVELA) 800 MG tablet Take 1,600 mg by mouth 3 (three) times daily with meals.    . hydrALAZINE (APRESOLINE) 25 MG tablet Take 1 tablet (25 mg total) by mouth 3 (three) times daily. (Patient not taking: Reported on 07/31/2017)    . oxyCODONE (ROXICODONE) 5 MG immediate release tablet Take 1 tablet (5 mg total) by mouth every 4 (four) hours as needed for severe pain. (Patient not taking: Reported on 09/20/2017) 15 tablet 0  . traMADol (ULTRAM) 50 MG tablet Take 1 tablet (50 mg total) by mouth every 6 (six) hours as needed. (Patient not taking: Reported on 09/20/2017) 8 tablet 0   No current facility-administered medications for this visit.     REVIEW OF SYSTEMS:  [X]  denotes positive finding, [ ]  denotes negative finding Vascular    Leg swelling    Cardiac    Chest pain or chest pressure:    Shortness of breath upon  exertion:    Short of breath when lying flat:    Irregular heart rhythm:    Constitutional    Fever or chills:     PHYSICAL EXAM:   Vitals:   09/20/17 0831  BP: 111/65  Pulse: 79  Resp: 20  Temp: (!) 97.2 F (36.2 C)  TempSrc: Oral  SpO2: 97%  Weight: 205 lb (93 kg)  Height: 5\' 8"  (1.727 m)    GENERAL: The patient is a well-nourished male, in no acute distress. The vital signs are documented above. CARDIOVASCULAR: There is a regular rate and rhythm. PULMONARY: There is good air exchange bilaterally without wheezing or rales. He has an excellent thrill in his left upper arm fistula. His incisions are healing nicely. He has a palpable radial pulse.  DATA:   DUPLEX AV FISTULA: I reviewed the duplex of his AV fistula was done yesterday.  The diameters of the vein range from 0.5-0.2 cm.  MEDICAL ISSUES:   STATUS POST LEFT BRACHIOCEPHALIC AV FISTULA: This fistula appears to be maturing adequately.  I think it should be ready for access beginning October 24, 2017.  I will see him back as needed.  Deitra Mayo Vascular and Vein Specialists of [facility] [phone]

## 2017-09-27 DIAGNOSIS — D689 Coagulation defect, unspecified: Secondary | ICD-10-CM | POA: Insufficient documentation

## 2017-09-27 DIAGNOSIS — Z23 Encounter for immunization: Secondary | ICD-10-CM | POA: Insufficient documentation

## 2017-09-27 DIAGNOSIS — N2581 Secondary hyperparathyroidism of renal origin: Secondary | ICD-10-CM | POA: Insufficient documentation

## 2017-10-09 DIAGNOSIS — Z992 Dependence on renal dialysis: Secondary | ICD-10-CM | POA: Insufficient documentation

## 2017-11-01 DIAGNOSIS — D649 Anemia, unspecified: Secondary | ICD-10-CM | POA: Insufficient documentation

## 2017-11-06 ENCOUNTER — Other Ambulatory Visit (HOSPITAL_COMMUNITY): Payer: BLUE CROSS/BLUE SHIELD

## 2017-11-07 ENCOUNTER — Other Ambulatory Visit: Payer: Self-pay

## 2017-11-07 ENCOUNTER — Ambulatory Visit (HOSPITAL_COMMUNITY): Payer: Medicare Other | Attending: Cardiovascular Disease

## 2017-11-07 DIAGNOSIS — I5032 Chronic diastolic (congestive) heart failure: Secondary | ICD-10-CM | POA: Diagnosis present

## 2017-11-09 ENCOUNTER — Telehealth: Payer: Self-pay

## 2017-11-09 DIAGNOSIS — I350 Nonrheumatic aortic (valve) stenosis: Secondary | ICD-10-CM

## 2017-11-09 NOTE — Telephone Encounter (Signed)
-----   Message from Josue Hector, MD sent at 11/08/2017 10:34 PM EDT ----- Very mild AS and mild MR EF normal overall ok F/U echo in a year for AS

## 2017-11-09 NOTE — Telephone Encounter (Signed)
Called patient with echo results. Per Dr. [name], Very mild AS and mild MR EF normal overall ok F/U echo in a year for AS. Patient verbalized understanding.

## 2017-12-10 NOTE — Progress Notes (Signed)
CARDIOLOGY OFFICE NOTE  Date:  12/14/2017    Edgar Brewer Date of Birth: [birth year] Medical Record #[id number]  PCP:  [name] Blocker, MD  Cardiologist:  Andrez Grime chief complaint on file.   History of Present Illness: Edgar Brewer is a 57 y.o. male who presents today for f/u diastolic CHF and mild AS He has CRF with Cr now 7.4 Started dialysis since I last saw him Has left brachio cephalic fistula. Also with anemia, DM, neuropathy and LE Varicosities. History of anemia due to CRF EGD 04/05/16 duodenitis and colon with multiple polyps removed and Diverticular disease.   TTE Reviewed 11/07/17 EF 60-65% mild AS mean gradient 11 mmHg mild MR  He is a big Electrical engineer to talk sports and thinks Alvino Blood stinks    Past Medical History:  Diagnosis Date  . Acute on chronic combined systolic and diastolic CHF, NYHA class 1 ([place]) 01/13/2015  . Arthritis   . Chronic kidney disease    MWF Edgar Brewer.  . Diabetes mellitus without complication ([place])    Type II  . GERD (gastroesophageal reflux disease)    at times  . Head injury with loss of consciousness ([place])   . History of kidney stones   . Hypertension   . Neuropathy   . Pulmonary hypertension (HCC)    PA peak pressure 41 mmHg 03/04/16 echo  . Varicose veins   . Varicose veins of lower extremities with complications 50/93/2671    Past Surgical History:  Procedure Laterality Date  . AV FISTULA PLACEMENT Left 06/01/2016   Procedure: ARTERIOVENOUS (AV) FISTULA CREATION;  Surgeon: [name], MD;  Location: [facility] OR;  Service: Vascular;  Laterality: Left;  . AV FISTULA PLACEMENT Left 08/10/2017   Procedure: BRACHIOCEPHALIC ARTERIOVENOUS (AV) FISTULA CREATION LEFT UPPER EXTREMITY;  Surgeon: [name], MD;  Location: [facility] OR;  Service: Vascular;  Laterality: Left;  . COLONOSCOPY W/ POLYPECTOMY    . ENDOVENOUS ABLATION SAPHENOUS VEIN W/ LASER Left 07-23-2015   endovenous laser ablation left greater saphenous vein by  [name]  . ENDOVENOUS ABLATION SAPHENOUS VEIN W/ LASER Right 10/15/2015   EVLA right greater saphenous vein by [name]     Medications: Current Outpatient Medications  Medication Sig Dispense Refill  . b complex-vitamin c-folic acid (NEPHRO-VITE) 0.8 MG TABS tablet Take 1 tablet by mouth daily.    . ferric citrate (AURYXIA) 1 GM 210 MG(Fe) tablet Take 420 mg by mouth 3 (three) times daily with meals.    . gabapentin (NEURONTIN) 300 MG capsule Take 300 mg by mouth 2 (two) times daily.    Edgar Brewer glipiZIDE (GLUCOTROL) 5 MG tablet Take 10 mg by mouth 2 (two) times daily before a meal.     . JANUVIA 50 MG tablet Take 100 mg by mouth daily.  3  . sevelamer carbonate (RENVELA) 800 MG tablet Take 1,600 mg by mouth 3 (three) times daily with meals.    . traMADol (ULTRAM) 50 MG tablet Take 1 tablet (50 mg total) by mouth every 6 (six) hours as needed. 8 tablet 0   No current facility-administered medications for this visit.     Allergies: Allergies  Allergen Reactions  . Codeine Hives, Swelling and Other (See Comments)    Swelling all over body     Social History: The patient  reports that he quit smoking about 2 years ago. His smoking use included cigarettes. He has a 45.00 pack-year smoking history. He  has never used smokeless tobacco. He reports that he drinks about 1.0 standard drinks of alcohol per week. He reports that he does not use drugs.   Family History: The patient's family history includes Cancer in his father.   Review of Systems: Please see the history of present illness.   Otherwise, the review of systems is positive for none.   All other systems are reviewed and negative.   Physical Exam: VS:  BP 122/60   Pulse 83   Ht 5\' 8"  (1.727 m)   Wt 205 lb 4 oz (93.1 kg)   SpO2 97%   BMI 31.21 kg/m  .  BMI Body mass index is 31.21 kg/m.  Wt Readings from Last 3 Encounters:  12/14/17 205 lb 4 oz (93.1 kg)  09/20/17 205 lb (93 kg)  08/10/17 200 lb (90.7 kg)     Affect appropriate Healthy:  appears stated age 9: normal Neck supple with no adenopathy JVP normal no bruits no thyromegaly Lungs clear with no wheezing and good diaphragmatic motion Heart:  S1/S2 preserved mild AS murmur, no rub, gallop or click PMI normal Abdomen: benighn, BS positve, no tenderness, no AAA no bruit.  No HSM or HJR Left radial-cephalic fistula with bad thrill more proximal antecubital  brachio-cephalic fistula better  No edema Neuro non-focal Skin warm and dry No muscular weakness   LABORATORY DATA:  EKG:   07/06/16 NSR with nonspecific T wave changes.  Lab Results  Component Value Date   WBC 6.5 06/24/2017   HGB 11.6 (L) 08/10/2017   HCT 34.0 (L) 08/10/2017   PLT 203 06/24/2017   GLUCOSE 179 (H) 08/10/2017   ALT 24 02/29/2016   AST 29 02/29/2016   NA 134 (L) 08/10/2017   K 3.8 08/10/2017   CL 97 (L) 06/24/2017   CREATININE 7.47 (H) 06/24/2017   BUN 59 (H) 06/24/2017   CO2 31 06/24/2017   TSH 1.256 01/13/2015   INR 1.08 01/13/2015   HGBA1C 6.9 (H) 08/10/2017    BNP (last 3 results) No results for input(s): BNP in the last 8760 hours.  ProBNP (last 3 results) No results for input(s): PROBNP in the last 8760 hours.   Other Studies Reviewed Today:  Myoview Study Highlights from 05/2014    Myocardial perfusion is normal. The study is abormal. This is an intermediate risk study. Overall left ventricular systolic function was abnormal. LV cavity size is normal. The left ventricular ejection fraction is moderately decreased (42%). There is no prior study for comparison.   Notes Recorded by Josue Hector, MD on 06/25/2014 at 8:31 AM Increase cozaar to 100 mg decrease lasix to once/day F/u with me to discuss cath EF low   Echo Study Conclusions from 05/2014  - Left ventricle: The cavity size was mildly dilated. Wall   thickness was increased in a pattern of mild LVH. Systolic   function was mildly reduced. The estimated ejection  fraction was   in the range of 45% to 50%. Diffuse hypokinesis. Doppler   parameters are consistent with elevated ventricular end-diastolic   filling pressure. - Mitral valve: There was mild regurgitation. - Left atrium: The atrium was mildly dilated. - Atrial septum: No defect or patent foramen ovale was identified. - Pulmonary arteries: PA peak pressure: 52 mm Hg (S). - Pericardium, extracardiac: Small to moderate circumferential   pericardial effusion no tamponade.   Assessment/Plan: 1. Anemia:  Multiple polyps removed f/u GI and CKD f/u primary   2. Chronic diastolic HF -  Not clear that this is an issue suspect fluid from A/CRF continue lasix  3. Varicose veins/venous insufficiency - chronic. Refer back to Dr Early ? Sclerosing/ablative Rx  4. CKD -   CR 3.6-4.6 f/u nephrology AV fistula done 06/01/16 by Dr Early F/U Dr Lorrene Reid still Making good urine    5. DM - Discussed low carb diet.  Target hemoglobin A1c is 6.5 or less.  Continue current medications.  6. Prior tobacco abuse - tells me he is not smoking CXR 07/05/16 with airway thickening bronchitis will update   7. Aortic Stenosis :  Very mild considering fistula with mean gradient 11 mmHg TTE done 11/07/17 consider F/u in a year    Jenkins Rouge

## 2017-12-14 ENCOUNTER — Encounter: Payer: Self-pay | Admitting: Cardiovascular Disease

## 2017-12-14 ENCOUNTER — Ambulatory Visit (INDEPENDENT_AMBULATORY_CARE_PROVIDER_SITE_OTHER): Payer: Medicare Other | Admitting: Cardiovascular Disease

## 2017-12-14 VITALS — BP 122/60 | HR 83 | Ht 68.0 in | Wt 205.2 lb

## 2017-12-14 DIAGNOSIS — I350 Nonrheumatic aortic (valve) stenosis: Secondary | ICD-10-CM

## 2017-12-14 DIAGNOSIS — I5032 Chronic diastolic (congestive) heart failure: Secondary | ICD-10-CM

## 2017-12-14 NOTE — Patient Instructions (Signed)
Medication Instructions:   If you need a refill on your cardiac medications before your next appointment, please call your pharmacy.   Lab work:  If you have labs (blood work) drawn today and your tests are completely normal, you will receive your results only by: Marland Kitchen MyChart Message (if you have MyChart) OR . A paper copy in the mail If you have any lab test that is abnormal or we need to change your treatment, we will call you to review the results.  Testing/Procedures: Your physician has requested that you have an echocardiogram in 1 year. Echocardiography is a painless test that uses sound waves to create images of your heart. It provides your doctor with information about the size and shape of your heart and how well your heart's chambers and valves are working. This procedure takes approximately one hour. There are no restrictions for this procedure.  Follow-Up: At [facility], you and your health needs are our priority.  As part of our continuing mission to provide you with exceptional heart care, we have created designated Provider Care Teams.  These Care Teams include your primary Cardiologist (physician) and Advanced Practice Providers (APPs -  Physician Assistants and Nurse Practitioners) who all work together to provide you with the care you need, when you need it. You will need a follow up appointment in 1 years.  Please call our office 2 months in advance to schedule this appointment.  You may see Jenkins Rouge, MD or one of the following Advanced Practice Providers on your designated Care Team:   Truitt Merle, NP Cecilie Kicks, NP . Kathyrn Drown, NP

## 2017-12-25 ENCOUNTER — Encounter: Payer: Self-pay | Admitting: Neurology

## 2017-12-25 ENCOUNTER — Other Ambulatory Visit: Payer: Self-pay

## 2017-12-25 ENCOUNTER — Ambulatory Visit (INDEPENDENT_AMBULATORY_CARE_PROVIDER_SITE_OTHER): Payer: Medicare Other | Admitting: Neurology

## 2017-12-25 VITALS — BP 137/73 | HR 90 | Resp 18 | Ht 68.0 in | Wt 207.0 lb

## 2017-12-25 DIAGNOSIS — R413 Other amnesia: Secondary | ICD-10-CM

## 2017-12-25 NOTE — Progress Notes (Signed)
Reason for visit: Memory disturbance, peripheral neuropathy  Referring physician: Dr. Cherlynn Polo is a 57 y.o. male  History of present illness:  Edgar Brewer is a 57 year old right-handed white male with a history of end-stage renal disease and diabetes.  The patient is on hemodialysis at this point and he is wanting to get on a transplant list, he was told that he was not eligible because he had dementia.  The patient comes in with his mother, the patient and the mother indicate that he has not had significant issues with memory.  The patient currently lives with his mother, he does not work.  He does not operate a motor vehicle because of visual issues.  The patient is able to manage his own medications, he claims that he does his own finances.  His mother helps him with his appointments.  The patient does have some daytime drowsiness, he claims that he does not snore at night and he sleeps well at night.  The patient has some numbness in the hands and feet associated with a diabetic peripheral neuropathy, he does have some balance issues but he denies any falls.  He denies issues controlling the bowels or the bladder, he still makes a small amount of urine.  The patient denies any family history of memory issues.  The patient does not have headaches or dizziness.  His mother indicates that he has had a lifelong history of dyslexia, he has always been slightly slow cognitively.  The patient denies any issues with misplacing things about the house.  He is sent to this office for an evaluation.  Past Medical History:  Diagnosis Date  . Acute on chronic combined systolic and diastolic CHF, NYHA class 1 ([place]) 01/13/2015  . Arthritis   . Chronic kidney disease    MWF [name].  . Diabetes mellitus without complication ([place])    Type II  . GERD (gastroesophageal reflux disease)    at times  . Head injury with loss of consciousness ([place])   . History of kidney stones   . Hypertension   .  Neuropathy   . Pulmonary hypertension (HCC)    PA peak pressure 41 mmHg 03/04/16 echo  . Varicose veins   . Varicose veins of lower extremities with complications 41/74/0814    Past Surgical History:  Procedure Laterality Date  . AV FISTULA PLACEMENT Left 06/01/2016   Procedure: ARTERIOVENOUS (AV) FISTULA CREATION;  Surgeon: [name], MD;  Location: [facility] OR;  Service: Vascular;  Laterality: Left;  . AV FISTULA PLACEMENT Left 08/10/2017   Procedure: BRACHIOCEPHALIC ARTERIOVENOUS (AV) FISTULA CREATION LEFT UPPER EXTREMITY;  Surgeon: [name], MD;  Location: [facility] OR;  Service: Vascular;  Laterality: Left;  . COLONOSCOPY W/ POLYPECTOMY    . ENDOVENOUS ABLATION SAPHENOUS VEIN W/ LASER Left 07-23-2015   endovenous laser ablation left greater saphenous vein by [name]  . ENDOVENOUS ABLATION SAPHENOUS VEIN W/ LASER Right 10/15/2015   EVLA right greater saphenous vein by [name]    Family History  Problem Relation Age of Onset  . Cancer Father     Social history:  reports that he quit smoking about 2 years ago. His smoking use included cigarettes. He has a 45.00 pack-year smoking history. He has never used smokeless tobacco. He reports that he drinks about 1.0 standard drinks of alcohol per week. He reports that he does not use drugs.  Medications:  Prior to Admission medications   Medication  Sig Start Date End Date Taking? Authorizing Provider  atorvastatin (LIPITOR) 20 MG tablet TAKE 1 TABLET BY MOUTH ONCE DAILY AT BEDTIME FOR 30 DAYS 11/02/17  Yes [provider]  b complex-vitamin c-folic acid (NEPHRO-VITE) 0.8 MG TABS tablet Take 1 tablet by mouth daily.   Yes [provider]  ferric citrate (AURYXIA) 1 GM 210 MG(Fe) tablet Take 420 mg by mouth 3 (three) times daily with meals.   Yes [provider]  gabapentin (NEURONTIN) 300 MG capsule Take 300 mg by mouth 2 (two) times daily.   Yes [provider]  glipiZIDE (GLUCOTROL) 5 MG  tablet Take 10 mg by mouth 2 (two) times daily before a meal.    Yes [provider]  JANUVIA 50 MG tablet Take 100 mg by mouth daily. 07/04/17  Yes [provider]  metFORMIN (GLUCOPHAGE) 500 MG tablet Take 500 mg by mouth at bedtime. 11/02/17  Yes [provider]  Omega-3 Fatty Acids (FISH OIL) 1000 MG CPDR Take by mouth.   Yes [provider]  omeprazole (PRILOSEC) 20 MG capsule Take 20 mg by mouth daily. 11/25/17  Yes [provider]  sevelamer carbonate (RENVELA) 800 MG tablet Take 1,600 mg by mouth 3 (three) times daily with meals.   Yes [provider]      Allergies  Allergen Reactions  . Codeine Hives, Swelling and Other (See Comments)    Swelling all over body     ROS:  Out of a complete 14 system review of symptoms, the patient complains only of the following symptoms, and all other reviewed systems are negative.  Heart murmur Blurred vision, double vision Impotence Feeling cold, cramps, aching muscles Runny nose Numbness Too much sleep, decreased energy Restless leg  Blood pressure 137/73, pulse 90, resp. rate 18, height 5\' 8"  (1.727 m), weight 207 lb (93.9 kg).  Physical Exam  General: The patient is alert and cooperative at the time of the examination.  The patient is moderately obese.  Eyes: Pupils are equal, round, and reactive to light. Discs are flat bilaterally.  Neck: The neck is supple, no carotid bruits are noted.  Respiratory: The respiratory examination is clear.  Cardiovascular: The cardiovascular examination reveals a regular rate and rhythm, no obvious murmurs or rubs are noted.  Skin: Extremities are without significant edema.  Neurologic Exam  Mental status: The patient is alert and oriented x 3 at the time of the examination. The Mini-Mental status examination done today shows a total score of 27/30.  The patient is able to name 12 four-legged animals in 1 minute.  Cranial nerves: Facial  symmetry is present. There is good sensation of the face to pinprick and soft touch bilaterally. The strength of the facial muscles and the muscles to head turning and shoulder shrug are normal bilaterally. Speech is well enunciated, no aphasia or dysarthria is noted. Extraocular movements are full. Visual fields are full. The tongue is midline, and the patient has symmetric elevation of the soft palate. No obvious hearing deficits are noted.  Motor: The motor testing reveals 5 over 5 strength of all 4 extremities, with exception that there is some weakness of the intrinsic muscles of the hands bilaterally.  Good symmetric motor tone is noted throughout.  Sensory: Sensory testing is intact to pinprick, soft touch, vibration sensation, and position sense on the upper extremities.  With the lower extremities there is a stocking pattern pinprick sensory deficit to just below the knees bilaterally.  Vibration sensation  and position sensation are moderately impaired in both feet.  No evidence of extinction is noted.  Coordination: Cerebellar testing reveals good finger-nose-finger and heel-to-shin bilaterally.  Gait and station: Gait is normal. Tandem gait is unsteady. Romberg is negative. No drift is seen.  Reflexes: Deep tendon reflexes are symmetric, but are depressed bilaterally. Toes are downgoing bilaterally.   Assessment/Plan:  1.  Diabetic peripheral neuropathy  2.  Mild gait disorder  3.  Reported memory disturbance  4.  End-stage renal disease on hemodialysis  The patient does report some problems with excessive daytime drowsiness.  He denies that he snores, but it is possible he may have sleep apnea.  The patient himself denies any significant memory issues, his mother confirms this.  We will send him for formal neuropsychological evaluation, if this appears to be relatively normal, the patient may still be a candidate for a renal transplant.  The mother indicates that the patient has  dyslexia, he has never been a good student in the past.  He will follow-up in 6 months.  Blood work will be done today, if a memory disorder is identified on the testing procedure, MRI of the brain may be done.  Jill Alexanders MD 12/25/2017 8:20 AM  [facility] [address] [place], [place] [address]  Phone [phone] Fax [phone]

## 2017-12-26 LAB — VITAMIN B12: Vitamin B-12: 577 pg/mL (ref 232–1245)

## 2017-12-26 LAB — RPR: RPR Ser Ql: NONREACTIVE

## 2017-12-26 LAB — HIV ANTIBODY (ROUTINE TESTING W REFLEX): HIV Screen 4th Generation wRfx: NONREACTIVE

## 2018-01-23 DIAGNOSIS — D509 Iron deficiency anemia, unspecified: Secondary | ICD-10-CM | POA: Insufficient documentation

## 2018-03-06 ENCOUNTER — Encounter (HOSPITAL_COMMUNITY): Payer: Self-pay | Admitting: Psychiatry

## 2018-03-06 ENCOUNTER — Ambulatory Visit (INDEPENDENT_AMBULATORY_CARE_PROVIDER_SITE_OTHER): Payer: Medicare Other | Admitting: Psychiatry

## 2018-03-06 DIAGNOSIS — Z7389 Other problems related to life management difficulty: Secondary | ICD-10-CM | POA: Diagnosis not present
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
## 2018-07-18 ENCOUNTER — Telehealth: Payer: Self-pay

## 2018-07-18 NOTE — Telephone Encounter (Signed)
I reached out to the pt and he and I discussed his appt scheduled for 07/18/18. Pt states he is doing ok and would prefer to wait till August to come in for his appt. Appt rescheduled till 08/28/18 at 3 pm.

## 2018-07-19 ENCOUNTER — Ambulatory Visit: Payer: Medicare Other | Admitting: Neurology

## 2018-08-28 ENCOUNTER — Ambulatory Visit (INDEPENDENT_AMBULATORY_CARE_PROVIDER_SITE_OTHER): Payer: Medicare Other | Admitting: Neurology

## 2018-08-28 ENCOUNTER — Telehealth: Payer: Self-pay | Admitting: Neurology

## 2018-08-28 ENCOUNTER — Other Ambulatory Visit: Payer: Self-pay

## 2018-08-28 ENCOUNTER — Encounter: Payer: Self-pay | Admitting: Neurology

## 2018-08-28 VITALS — BP 119/72 | HR 79 | Temp 98.4°F | Ht 68.0 in | Wt 207.0 lb

## 2018-08-28 DIAGNOSIS — R413 Other amnesia: Secondary | ICD-10-CM

## 2018-08-28 NOTE — Progress Notes (Signed)
Reason for visit: Memory disturbance  Edgar Brewer is an 58 y.o. male  History of present illness:  Edgar Brewer is a 58 year old right-handed white male with a history of end-stage renal disease on hemodialysis.  He would like to be on a list for a renal transplant but there has been some question about a dementia.  The mother indicates that his memory has not altered over time, he has always been a slow learner, he has dyslexia.  The patient does not operate a motor vehicle mainly because of some vision issues.  He has not had any new medical issues that have come up since last seen.  He was set up for a neuropsychological evaluation but he never heard about scheduling for this.  He has not had a scan of the head.  He returns this office for an evaluation.  He claims that he is sleeping fairly well at night, there was some concern previously about excessive daytime drowsiness.  Past Medical History:  Diagnosis Date  . Acute on chronic combined systolic and diastolic CHF, NYHA class 1 ([place]) 01/13/2015  . Arthritis   . Chronic kidney disease    MWF [name].  . Diabetes mellitus without complication ([place])    Type II  . GERD (gastroesophageal reflux disease)    at times  . Head injury with loss of consciousness ([place])   . History of kidney stones   . Hypertension   . Neuropathy   . Pulmonary hypertension (HCC)    PA peak pressure 41 mmHg 03/04/16 echo  . Varicose veins   . Varicose veins of lower extremities with complications 64/40/3474    Past Surgical History:  Procedure Laterality Date  . AV FISTULA PLACEMENT Left 06/01/2016   Procedure: ARTERIOVENOUS (AV) FISTULA CREATION;  Surgeon: [name], MD;  Location: [facility] OR;  Service: Vascular;  Laterality: Left;  . AV FISTULA PLACEMENT Left 08/10/2017   Procedure: BRACHIOCEPHALIC ARTERIOVENOUS (AV) FISTULA CREATION LEFT UPPER EXTREMITY;  Surgeon: [name], MD;  Location: [facility] OR;  Service: Vascular;  Laterality: Left;  .  COLONOSCOPY W/ POLYPECTOMY    . ENDOVENOUS ABLATION SAPHENOUS VEIN W/ LASER Left 07-23-2015   endovenous laser ablation left greater saphenous vein by [name]  . ENDOVENOUS ABLATION SAPHENOUS VEIN W/ LASER Right 10/15/2015   EVLA right greater saphenous vein by [name]    Family History  Problem Relation Age of Onset  . Cancer Father     Social history:  reports that he quit smoking about 3 years ago. His smoking use included cigarettes. He has a 45.00 pack-year smoking history. He has never used smokeless tobacco. He reports current alcohol use of about 1.0 standard drinks of alcohol per week. He reports that he does not use drugs.    Allergies  Allergen Reactions  . Codeine Hives, Swelling and Other (See Comments)    Swelling all over body     Medications:  Prior to Admission medications   Medication Sig Start Date End Date Taking? Authorizing Provider  atorvastatin (LIPITOR) 20 MG tablet TAKE 1 TABLET BY MOUTH ONCE DAILY AT BEDTIME FOR 30 DAYS 11/02/17  Yes [provider]  b complex-vitamin c-folic acid (NEPHRO-VITE) 0.8 MG TABS tablet Take 1 tablet by mouth daily.   Yes [provider]  cinacalcet (SENSIPAR) 30 MG tablet TAKE 1 TABLET BY MOUTH DAILY WITH DINNER (DO NOT TAKE LESS THAN 12 HOURS PRIOR TO DIALYSIS) 07/31/18  Yes [provider]  Docusate Sodium (STOOL SOFTENER LAXATIVE PO) Take by mouth.   Yes [provider]  ferric citrate (AURYXIA) 1 GM 210 MG(Fe) tablet Take 420 mg by mouth 3 (three) times daily with meals.   Yes [provider]  gabapentin (NEURONTIN) 300 MG capsule Take 300 mg by mouth 2 (two) times daily.   Yes [provider]  glipiZIDE (GLUCOTROL) 5 MG tablet Take 10 mg by mouth 2 (two) times daily before a meal.    Yes [provider]  JANUVIA 50 MG tablet Take 100 mg by mouth daily. 07/04/17  Yes [provider]  Omega-3 Fatty Acids (FISH OIL) 1000 MG CPDR Take by mouth.   Yes  [provider]  omeprazole (PRILOSEC) 20 MG capsule Take 20 mg by mouth daily. 11/25/17  Yes [provider]  sevelamer carbonate (RENVELA) 800 MG tablet Take 1,600 mg by mouth 3 (three) times daily with meals.   Yes [provider]    ROS:  Out of a complete 14 system review of symptoms, the patient complains only of the following symptoms, and all other reviewed systems are negative.  Memory problems  Blood pressure 119/72, pulse 79, temperature 98.4 F (36.9 C), temperature source Temporal, height 5\' 8"  (1.727 m), weight 207 lb (93.9 kg).  Physical Exam  General: The patient is alert and cooperative at the time of the examination.  The patient is markedly obese.  Skin: No significant peripheral edema is noted.   Neurologic Exam  Mental status: The patient is alert and oriented x 3 at the time of the examination. The patient has apparent normal recent and remote memory, with an apparently normal attention span and concentration ability.   Cranial nerves: Facial symmetry is present. Speech is normal, no aphasia or dysarthria is noted. Extraocular movements are full. Visual fields are full.  Motor: The patient has good strength in all 4 extremities.  Sensory examination: Soft touch sensation is symmetric on the face, arms, and legs.  Coordination: The patient has good finger-nose-finger and heel-to-shin bilaterally.  Gait and station: The patient has a normal gait. Tandem gait is slightly unsteady. Romberg is negative. No drift is seen.  Reflexes: Deep tendon reflexes are symmetric, but are depressed.   Assessment/Plan:  1.  End-stage renal disease  2.  Mild memory disturbance  The patient will be sent for formal neuropsychological testing again.  He will have MRI of the brain.  We will follow-up in about 6 months.  We are trying to determine whether or not the patient has an organic dementia versus a pre-existing learning disorder.  Jill Alexanders MD 08/28/2018 3:03 PM  [facility] [address] [place], [place] [address]  Phone [phone] Fax [phone]

## 2018-08-28 NOTE — Telephone Encounter (Signed)
medicare/aarp order sent to GI. No auth they will reach out to the patient to schedule.  °

## 2018-09-27 ENCOUNTER — Other Ambulatory Visit: Payer: Self-pay

## 2018-09-27 ENCOUNTER — Ambulatory Visit
Admission: RE | Admit: 2018-09-27 | Discharge: 2018-09-27 | Disposition: A | Discharge status: Home / Self Care | Payer: Medicare Other | Source: Ambulatory Visit | Attending: Neurology | Admitting: Neurology

## 2018-09-27 DIAGNOSIS — R413 Other amnesia: Secondary | ICD-10-CM

## 2018-09-28 ENCOUNTER — Telehealth: Payer: Self-pay | Admitting: Neurology

## 2018-09-28 NOTE — Telephone Encounter (Signed)
I called the patient, talk with the mother.  MRI of the brain does show some small vessel disease, there is a small lacunar infarction in the medial thalamus that could potentially affect memory.  If possible, the patient should go on low-dose aspirin, 81 mg daily.  He does have risk factors for stroke with diabetes and hypertension.    MRI brain 09/27/18:  IMPRESSION: This MRI of the brain without contrast shows the following: 1.   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 2.    Minimal chronic microvascular ischemic changes in the hemispheres. 3.    Brain volume is normal for age. 4.    No acute findings.

## 2018-10-02 ENCOUNTER — Emergency Department (HOSPITAL_COMMUNITY): Payer: Medicare Other

## 2018-10-02 ENCOUNTER — Other Ambulatory Visit: Payer: Self-pay

## 2018-10-02 ENCOUNTER — Inpatient Hospital Stay (HOSPITAL_COMMUNITY)
Admission: EM | Admit: 2018-10-02 | Discharge: 2018-10-06 | DRG: 417 | Disposition: A | Discharge status: Home / Self Care | Payer: Medicare Other | Attending: Internal Medicine | Admitting: Internal Medicine

## 2018-10-02 DIAGNOSIS — E872 Acidosis: Secondary | ICD-10-CM | POA: Diagnosis present

## 2018-10-02 DIAGNOSIS — I5042 Chronic combined systolic (congestive) and diastolic (congestive) heart failure: Secondary | ICD-10-CM | POA: Diagnosis present

## 2018-10-02 DIAGNOSIS — Z87442 Personal history of urinary calculi: Secondary | ICD-10-CM | POA: Diagnosis not present

## 2018-10-02 DIAGNOSIS — K219 Gastro-esophageal reflux disease without esophagitis: Secondary | ICD-10-CM | POA: Diagnosis present

## 2018-10-02 DIAGNOSIS — I272 Pulmonary hypertension, unspecified: Secondary | ICD-10-CM | POA: Diagnosis present

## 2018-10-02 DIAGNOSIS — Z20828 Contact with and (suspected) exposure to other viral communicable diseases: Secondary | ICD-10-CM | POA: Diagnosis present

## 2018-10-02 DIAGNOSIS — E119 Type 2 diabetes mellitus without complications: Secondary | ICD-10-CM

## 2018-10-02 DIAGNOSIS — E1142 Type 2 diabetes mellitus with diabetic polyneuropathy: Secondary | ICD-10-CM | POA: Diagnosis present

## 2018-10-02 DIAGNOSIS — F0390 Unspecified dementia without behavioral disturbance: Secondary | ICD-10-CM | POA: Diagnosis present

## 2018-10-02 DIAGNOSIS — E1122 Type 2 diabetes mellitus with diabetic chronic kidney disease: Secondary | ICD-10-CM | POA: Diagnosis present

## 2018-10-02 DIAGNOSIS — K8064 Calculus of gallbladder and bile duct with chronic cholecystitis without obstruction: Principal | ICD-10-CM | POA: Diagnosis present

## 2018-10-02 DIAGNOSIS — K760 Fatty (change of) liver, not elsewhere classified: Secondary | ICD-10-CM | POA: Diagnosis present

## 2018-10-02 DIAGNOSIS — N2581 Secondary hyperparathyroidism of renal origin: Secondary | ICD-10-CM | POA: Diagnosis present

## 2018-10-02 DIAGNOSIS — I132 Hypertensive heart and chronic kidney disease with heart failure and with stage 5 chronic kidney disease, or end stage renal disease: Secondary | ICD-10-CM | POA: Diagnosis present

## 2018-10-02 DIAGNOSIS — K8050 Calculus of bile duct without cholangitis or cholecystitis without obstruction: Secondary | ICD-10-CM

## 2018-10-02 DIAGNOSIS — Z23 Encounter for immunization: Secondary | ICD-10-CM | POA: Diagnosis present

## 2018-10-02 DIAGNOSIS — Z79899 Other long term (current) drug therapy: Secondary | ICD-10-CM

## 2018-10-02 DIAGNOSIS — I10 Essential (primary) hypertension: Secondary | ICD-10-CM | POA: Diagnosis present

## 2018-10-02 DIAGNOSIS — K5730 Diverticulosis of large intestine without perforation or abscess without bleeding: Secondary | ICD-10-CM | POA: Diagnosis present

## 2018-10-02 DIAGNOSIS — R1110 Vomiting, unspecified: Secondary | ICD-10-CM | POA: Diagnosis present

## 2018-10-02 DIAGNOSIS — M1990 Unspecified osteoarthritis, unspecified site: Secondary | ICD-10-CM | POA: Diagnosis present

## 2018-10-02 DIAGNOSIS — E1159 Type 2 diabetes mellitus with other circulatory complications: Secondary | ICD-10-CM | POA: Diagnosis present

## 2018-10-02 DIAGNOSIS — N186 End stage renal disease: Secondary | ICD-10-CM | POA: Diagnosis present

## 2018-10-02 DIAGNOSIS — Z885 Allergy status to narcotic agent status: Secondary | ICD-10-CM

## 2018-10-02 DIAGNOSIS — D631 Anemia in chronic kidney disease: Secondary | ICD-10-CM | POA: Diagnosis present

## 2018-10-02 DIAGNOSIS — Z992 Dependence on renal dialysis: Secondary | ICD-10-CM | POA: Diagnosis not present

## 2018-10-02 DIAGNOSIS — Z7984 Long term (current) use of oral hypoglycemic drugs: Secondary | ICD-10-CM

## 2018-10-02 DIAGNOSIS — R17 Unspecified jaundice: Secondary | ICD-10-CM

## 2018-10-02 DIAGNOSIS — E1165 Type 2 diabetes mellitus with hyperglycemia: Secondary | ICD-10-CM | POA: Diagnosis present

## 2018-10-02 DIAGNOSIS — E1151 Type 2 diabetes mellitus with diabetic peripheral angiopathy without gangrene: Secondary | ICD-10-CM | POA: Diagnosis present

## 2018-10-02 DIAGNOSIS — Z87891 Personal history of nicotine dependence: Secondary | ICD-10-CM

## 2018-10-02 DIAGNOSIS — E118 Type 2 diabetes mellitus with unspecified complications: Secondary | ICD-10-CM

## 2018-10-02 DIAGNOSIS — R7989 Other specified abnormal findings of blood chemistry: Secondary | ICD-10-CM

## 2018-10-02 LAB — COMPREHENSIVE METABOLIC PANEL
ALT: 306 U/L — ABNORMAL HIGH (ref 0–44)
AST: 308 U/L — ABNORMAL HIGH (ref 15–41)
Albumin: 3.6 g/dL (ref 3.5–5.0)
Alkaline Phosphatase: 762 U/L — ABNORMAL HIGH (ref 38–126)
Anion gap: 16 — ABNORMAL HIGH (ref 5–15)
BUN: 45 mg/dL — ABNORMAL HIGH (ref 6–20)
CO2: 23 mmol/L (ref 22–32)
Calcium: 8.3 mg/dL — ABNORMAL LOW (ref 8.9–10.3)
Chloride: 95 mmol/L — ABNORMAL LOW (ref 98–111)
Creatinine, Ser: 7.91 mg/dL — ABNORMAL HIGH (ref 0.61–1.24)
GFR calc Af Amer: 8 mL/min — ABNORMAL LOW (ref >60)
GFR calc non Af Amer: 7 mL/min — ABNORMAL LOW (ref >60)
Glucose, Bld: 286 mg/dL — ABNORMAL HIGH (ref 70–99)
Potassium: 5.1 mmol/L (ref 3.5–5.1)
Sodium: 134 mmol/L — ABNORMAL LOW (ref 135–145)
Total Bilirubin: 3.6 mg/dL — ABNORMAL HIGH (ref 0.3–1.2)
Total Protein: 7.7 g/dL (ref 6.5–8.1)

## 2018-10-02 LAB — URINALYSIS, ROUTINE W REFLEX MICROSCOPIC
Bilirubin Urine: NEGATIVE
Glucose, UA: 150 mg/dL — AB
Ketones, ur: NEGATIVE mg/dL
Leukocytes,Ua: NEGATIVE
Nitrite: NEGATIVE
Protein, ur: >=300 mg/dL — AB
Specific Gravity, Urine: 1.022 (ref 1.005–1.030)
pH: 5 (ref 5.0–8.0)

## 2018-10-02 LAB — CBC WITH DIFFERENTIAL/PLATELET
Abs Immature Granulocytes: 0.06 10*3/uL (ref 0.00–0.07)
Basophils Absolute: 0 10*3/uL (ref 0.0–0.1)
Basophils Relative: 1 %
Eosinophils Absolute: 0 10*3/uL (ref 0.0–0.5)
Eosinophils Relative: 0 %
HCT: 34.8 % — ABNORMAL LOW (ref 39.0–52.0)
Hemoglobin: 11.4 g/dL — ABNORMAL LOW (ref 13.0–17.0)
Immature Granulocytes: 1 %
Lymphocytes Relative: 4 %
Lymphs Abs: 0.4 10*3/uL — ABNORMAL LOW (ref 0.7–4.0)
MCH: 29.6 pg (ref 26.0–34.0)
MCHC: 32.8 g/dL (ref 30.0–36.0)
MCV: 90.4 fL (ref 80.0–100.0)
Monocytes Absolute: 0.6 10*3/uL (ref 0.1–1.0)
Monocytes Relative: 7 %
Neutro Abs: 7.7 10*3/uL (ref 1.7–7.7)
Neutrophils Relative %: 87 %
Platelets: 192 10*3/uL (ref 150–400)
RBC: 3.85 MIL/uL — ABNORMAL LOW (ref 4.22–5.81)
RDW: 13.9 % (ref 11.5–15.5)
WBC: 8.7 10*3/uL (ref 4.0–10.5)
nRBC: 0 % (ref 0.0–0.2)

## 2018-10-02 LAB — AMMONIA: Ammonia: 42 umol/L — ABNORMAL HIGH (ref 9–35)

## 2018-10-02 LAB — SARS CORONAVIRUS 2 BY RT PCR (HOSPITAL ORDER, PERFORMED IN ~~LOC~~ HOSPITAL LAB): SARS Coronavirus 2: NEGATIVE

## 2018-10-02 LAB — LIPASE, BLOOD: Lipase: 74 U/L — ABNORMAL HIGH (ref 11–51)

## 2018-10-02 LAB — CBG MONITORING, ED: Glucose-Capillary: 278 mg/dL — ABNORMAL HIGH (ref 70–99)

## 2018-10-02 MED ORDER — IOHEXOL 300 MG/ML  SOLN
100.0000 mL | Freq: Once | INTRAMUSCULAR | Status: AC | PRN
  Administered 2018-10-02: 100 mL via INTRAVENOUS

## 2018-10-02 NOTE — ED Notes (Signed)
Urine culture sent with urinalysis.  

## 2018-10-02 NOTE — H&P (Signed)
History and Physical    Edgar Brewer IEP:[id number] DOB: 1960-10-04 DOA: 10/02/2018  PCP: System, Pcp Not In Patient coming from: Home  Chief Complaint: Altered mental status, vomiting  HPI: Edgar Brewer is a 58 y.o. male with medical history significant of ESRD on HD, chronic combined systolic and diastolic congestive heart failure, pulmonary hypertension, type 2 diabetes, hypertension, chronic memory loss presenting to the hospital for evaluation of altered mental status and vomiting.  Patient is a poor historian and it was difficult to obtain a thorough history from him.  States for the past 4 to 5 days he has had some episodes of vomiting in the morning however has been able to eat food.  He has also been having intermittent abdominal pain for the past few days which he describes as epigastric and radiating to the right upper quadrant.  He is not sure if his abdominal pain is associated with eating.  Reports having loose stools.  Denies history of gallbladder problems.  Denies any fevers or chills.  Denies any chest pain, shortness of breath, or cough.  No other complaints.  ED Course: Vital signs stable on arrival.  No leukocytosis.  Hemoglobin 11.4, at baseline.  Blood glucose 286.  Bicarb 23, anion gap 16.  LFTs elevated-AST 308, ALT 306, alk phos 762, and T bili 3.6.  Lipase mildly elevated at 74.  UA with negative nitrite, negative leukocyte esterase, 6-10 WBCs, and rare bacteria on microscopic examination.  Ammonia level pending.  SARS-CoV-2 test pending.  Chest x-ray showing no acute cardiopulmonary abnormality.  Abdominal x-ray showing borderline to mild gaseous dilatation of central small bowel though with scattered colon gas present, findings could be secondary to enteritis or ileus however partial or developing bowel obstruction could also produce this appearance.  CT abdomen pelvis showing evidence of cholelithiasis but no cholecystitis.  No focal hepatic abnormality or biliary ductal  dilatation.  Also showing colonic diverticulosis but no active diverticulitis.  Stomach and small bowel decompressed, unremarkable.  No acute finding in the abdomen or pelvis.  Right upper quadrant ultrasound showing sludge and stones within the gallbladder with increased wall thickness but negative sonographic Murphy sign.  Findings nonspecific and thought to be secondary to acute on chronic cholecystitis, liver disease, or edema forming states.  Slight echogenic liver suggesting steatosis.  CT head showing no acute intracranial pathology. Patient received no medications in the ED.  Review of Systems:  All systems reviewed and apart from history of presenting illness, are negative.  Past Medical History:  Diagnosis Date   Acute on chronic combined systolic and diastolic CHF, NYHA class 1 ([place]) 01/13/2015   Arthritis    Chronic kidney disease    MWF [name].   Diabetes mellitus without complication ([place])    Type II   GERD (gastroesophageal reflux disease)    at times   Head injury with loss of consciousness ([place])    History of kidney stones    Hypertension    Neuropathy    Pulmonary hypertension ([place])    PA peak pressure 41 mmHg 03/04/16 echo   Varicose veins    Varicose veins of lower extremities with complications 66/06/3014    Past Surgical History:  Procedure Laterality Date   AV FISTULA PLACEMENT Left 06/01/2016   Procedure: ARTERIOVENOUS (AV) FISTULA CREATION;  Surgeon: [name], MD;  Location: [facility] OR;  Service: Vascular;  Laterality: Left;   AV FISTULA PLACEMENT Left 08/10/2017   Procedure: BRACHIOCEPHALIC ARTERIOVENOUS (AV) FISTULA CREATION LEFT UPPER  EXTREMITY;  Surgeon: [name], MD;  Location: [facility] OR;  Service: Vascular;  Laterality: Left;   COLONOSCOPY W/ POLYPECTOMY     ENDOVENOUS ABLATION SAPHENOUS VEIN W/ LASER Left 07-23-2015   endovenous laser ablation left greater saphenous vein by [name]   ENDOVENOUS ABLATION SAPHENOUS VEIN W/  LASER Right 10/15/2015   EVLA right greater saphenous vein by [name]     reports that he quit smoking about 3 years ago. His smoking use included cigarettes. He has a 45.00 pack-year smoking history. He has never used smokeless tobacco. He reports current alcohol use of about 1.0 standard drinks of alcohol per week. He reports that he does not use drugs.  Allergies  Allergen Reactions   Codeine Hives, Swelling and Other (See Comments)    Swelling all over body     Family History  Problem Relation Age of Onset   Cancer Father     Prior to Admission medications   Medication Sig Start Date End Date Taking? Authorizing Provider  atorvastatin (LIPITOR) 20 MG tablet TAKE 1 TABLET BY MOUTH ONCE DAILY AT BEDTIME FOR 30 DAYS 11/02/17   [provider]  b complex-vitamin c-folic acid (NEPHRO-VITE) 0.8 MG TABS tablet Take 1 tablet by mouth daily.    [provider]  cinacalcet (SENSIPAR) 30 MG tablet TAKE 1 TABLET BY MOUTH DAILY WITH DINNER (DO NOT TAKE LESS THAN 12 HOURS PRIOR TO DIALYSIS) 07/31/18   [provider]  Docusate Sodium (STOOL SOFTENER LAXATIVE PO) Take by mouth.    [provider]  ferric citrate (AURYXIA) 1 GM 210 MG(Fe) tablet Take 420 mg by mouth 3 (three) times daily with meals.    [provider]  gabapentin (NEURONTIN) 300 MG capsule Take 300 mg by mouth 2 (two) times daily.    [provider]  glipiZIDE (GLUCOTROL) 5 MG tablet Take 10 mg by mouth 2 (two) times daily before a meal.     [provider]  JANUVIA 50 MG tablet Take 100 mg by mouth daily. 07/04/17   [provider]  Omega-3 Fatty Acids (FISH OIL) 1000 MG CPDR Take by mouth.    [provider]  omeprazole (PRILOSEC) 20 MG capsule Take 20 mg by mouth daily. 11/25/17   [provider]  sevelamer carbonate (RENVELA) 800 MG tablet Take 1,600 mg by mouth 3 (three) times daily with meals.    [provider]     Physical Exam: Vitals:   10/03/18 0156 10/03/18 0200 10/03/18 0230 10/03/18 0523  BP: 136/65 132/70 (!) 160/72 121/61  Pulse: 79  81 79  Resp: 20 (!) _0 Temp: 98.8 F (37.1 C)  98.6 F (37 C) 98.4 F (36.9 C)  TempSrc: Oral  Oral Oral  SpO2:   98% 97%  Weight:   91.7 kg   Height:   _1  (1.727 m)     Physical Exam  Constitutional: He is oriented to person, place, and time. He appears well-developed and well-nourished. No distress.  Resting comfortably in a stretcher  HENT:  Head: Normocephalic.  Mouth/Throat: Oropharynx is clear and moist.  Eyes: Right eye exhibits no discharge. Left eye exhibits no discharge.  Neck: Neck supple.  Cardiovascular: Normal rate, regular rhythm and intact distal pulses.  Pulmonary/Chest: Effort normal and breath sounds normal. No respiratory distress. He has no wheezes. He has no rales.  Abdominal: Soft. Bowel sounds are normal. He exhibits distension. There is no abdominal tenderness. There  is no rebound and no guarding.  Musculoskeletal:        General: No edema.  Neurological: He is alert and oriented to person, place, and time.  Speech fluent, tongue midline, no facial droop. Strength 5 out of 5 in bilateral upper and lower extremities. Sensation to light touch intact throughout.  Skin: Skin is warm and dry. He is not diaphoretic.     Labs on Admission: I have personally reviewed following labs and imaging studies  CBC: Recent Labs  Lab 10/02/18 1726  WBC 8.7  NEUTROABS 7.7  HGB 11.4*  HCT 34.8*  MCV 90.4  PLT 196   Basic Metabolic Panel: Recent Labs  Lab 10/02/18 1726 10/03/18 0530  NA 134* 136  K 5.1 4.4  CL 95* 95*  CO2 23 26  GLUCOSE 286* 109*  BUN 45* 51*  CREATININE 7.91* 8.71*  CALCIUM 8.3* 8.5*   GFR: Estimated Creatinine Clearance: 10 mL/min (A) (by C-G formula based on SCr of 8.71 mg/dL (H)). Liver Function Tests: Recent Labs  Lab 10/02/18 1726 10/03/18 0530  AST 308* 216*  ALT 306* 265*   ALKPHOS 762* 736*  BILITOT 3.6* 3.7*  PROT 7.7 7.1  ALBUMIN 3.6 3.2*   Recent Labs  Lab 10/02/18 1822  LIPASE 74*   Recent Labs  Lab 10/02/18 2201  AMMONIA 42*   Coagulation Profile: No results for input(s): INR, PROTIME in the last 168 hours. Cardiac Enzymes: No results for input(s): CKTOTAL, CKMB, CKMBINDEX, TROPONINI in the last 168 hours. BNP (last 3 results) No results for input(s): PROBNP in the last 8760 hours. HbA1C: No results for input(s): HGBA1C in the last 72 hours. CBG: Recent Labs  Lab 10/02/18 1719 10/03/18 0155  GLUCAP 278* 104*   Lipid Profile: No results for input(s): CHOL, HDL, LDLCALC, TRIG, CHOLHDL, LDLDIRECT in the last 72 hours. Thyroid Function Tests: No results for input(s): TSH, T4TOTAL, FREET4, T3FREE, THYROIDAB in the last 72 hours. Anemia Panel: No results for input(s): VITAMINB12, FOLATE, FERRITIN, TIBC, IRON, RETICCTPCT in the last 72 hours. Urine analysis:    Component Value Date/Time   COLORURINE AMBER (A) 10/02/2018 2117   APPEARANCEUR HAZY (A) 10/02/2018 2117   LABSPEC 1.022 10/02/2018 2117   PHURINE 5.0 10/02/2018 2117   GLUCOSEU 150 (A) 10/02/2018 2117   HGBUR MODERATE (A) 10/02/2018 2117   BILIRUBINUR NEGATIVE 10/02/2018 2117   KETONESUR NEGATIVE 10/02/2018 2117   PROTEINUR >=300 (A) 10/02/2018 2117   NITRITE NEGATIVE 10/02/2018 2117   LEUKOCYTESUR NEGATIVE 10/02/2018 2117    Radiological Exams on Admission: Ct Head Wo Contrast  Result Date: 10/02/2018 CLINICAL DATA:  Altered level of consciousness EXAM: CT HEAD WITHOUT CONTRAST TECHNIQUE: Contiguous axial images were obtained from the base of the skull through the vertex without intravenous contrast. COMPARISON:  None. FINDINGS: Brain: No evidence of acute territorial infarction, hemorrhage, hydrocephalus,extra-axial collection or mass lesion/mass effect. Normal gray-white differentiation. Ventricles are normal in size and contour. Vascular: No hyperdense vessel or  unexpected calcification. Skull: The skull is intact. No fracture or focal lesion identified. Sinuses/Orbits: The visualized paranasal sinuses and mastoid air cells are clear. The orbits and globes intact. Other: None IMPRESSION: No acute intracranial pathology. Electronically Signed   By: Prudencio Pair M.D.   On: 10/02/2018 19:55   Ct Abdomen Pelvis W Contrast  Result Date: 10/02/2018 CLINICAL DATA:  Abdominal distention, nausea, vomiting. Question gallbladder disease. EXAM: CT ABDOMEN AND PELVIS WITH CONTRAST TECHNIQUE: Multidetector CT imaging of the abdomen and pelvis was performed using the  standard protocol following bolus administration of intravenous contrast. CONTRAST:  12m OMNIPAQUE IOHEXOL 300 MG/ML  SOLN COMPARISON:  None. FINDINGS: Lower chest: Cardiomegaly.  No confluent opacities or effusions. Hepatobiliary: Multiple gallstones layering within the gallbladder. No focal hepatic abnormality or biliary ductal dilatation. No visible gallbladder wall thickening. Pancreas: No focal abnormality or ductal dilatation. Spleen: No focal abnormality.  Normal size. Adrenals/Urinary Tract: No adrenal abnormality. No focal renal abnormality. No stones or hydronephrosis. Urinary bladder is unremarkable. Stomach/Bowel: Colonic diverticulosis. No active diverticulitis. Stomach and small bowel decompressed, unremarkable. Vascular/Lymphatic: No evidence of aneurysm or adenopathy. Aortic atherosclerosis. Reproductive: No visible focal abnormality. Other: No free fluid or free air. Musculoskeletal: No acute bony abnormality. IMPRESSION: Colonic diverticulosis crash that cholelithiasis. No CT evidence for cholecystitis. Colonic diverticulosis.  No diverticulitis. Aortic atherosclerosis. No acute findings in the abdomen or pelvis. Electronically Signed   By: KRolm BaptiseM.D.   On: 10/02/2018 19:57   Dg Abdomen Acute W/chest  Result Date: 10/02/2018 CLINICAL DATA: Nausea and vomiting EXAM: DG ABDOMEN ACUTE W/ 1V CHEST  COMPARISON:  Chest x-ray 07/06/2016 FINDINGS: Single-view chest demonstrates no focal airspace disease or effusion. Cardiomediastinal silhouette within normal limits. No pneumothorax. No free air beneath the diaphragm. Borderline to mild gaseous distention of central small bowel loops with scattered colon gas present. IMPRESSION: 1. No radiographic evidence for acute cardiopulmonary abnormality. 2. Borderline to mild gaseous dilatation of central small bowel though with scattered colon gas present, findings could be secondary to enteritis or ileus, however partial or developing bowel obstruction could also produce this appearance. Electronically Signed   By: KDonavan FoilM.D.   On: 10/02/2018 18:58   UKoreaAbdomen Limited Ruq  Result Date: 10/02/2018 CLINICAL DATA:  Elevated LFTs EXAM: ULTRASOUND ABDOMEN LIMITED RIGHT UPPER QUADRANT COMPARISON:  CT 10/02/2018 FINDINGS: Gallbladder: Sludge in multiple small stones in the gallbladder measuring up to 1.4 cm. Increased wall thickness at 5.7 mm. Negative sonographic Murphy. Common bile duct: Diameter: 2.9 mm Liver: Increased hepatic echogenicity without focal hepatic abnormality. Portal vein is patent on color Doppler imaging with normal direction of blood flow towards the liver. Other: None. IMPRESSION: 1. Sludge and stones within the gallbladder with increased wall thickness but negative sonographic Murphy. Findings are nonspecific and could be secondary to acute or chronic cholecystitis, liver disease, or edema forming states. If acute gallbladder disease remains a concern, further evaluation with hepatobiliary nuclear medicine imaging could be obtained. 2. Slightly echogenic liver suggesting steatosis Electronically Signed   By: KDonavan FoilM.D.   On: 10/02/2018 21:35    EKG: Independently reviewed.  Sinus rhythm, heart rate 90.  Assessment/Plan Principal Problem:   Choledocholithiasis Active Problems:   Diabetes mellitus type II, non insulin dependent  (HCC)   Accelerated hypertension   ESRD (end stage renal disease) on dialysis (HCC)   Suspected choledocholithiasis LFTs elevated-AST 308, ALT 306, alk phos 762, and T bili 3.6.  Lipase mildly elevated at 74.  CT abdomen pelvis showing evidence of cholelithiasis but no cholecystitis.  No focal hepatic abnormality or biliary ductal dilatation. Right upper quadrant ultrasound showing sludge and stones within the gallbladder with increased wall thickness but negative sonographic Murphy sign.  Acute cholangitis less likely given no fever, leukocytosis, or jaundice.  Abdominal exam benign.  Appears comfortable and currently not complaining of any abdominal pain. -ED provider discussed the case with Dr. WDonne Hazelfrom general surgery who recommended obtaining an MRCP and monitoring LFTs.  MRCP has been ordered. -Keep n.p.o. -Zofran PRN nausea -  Continue to monitor LFTs  ?AMS Per ED report, patient's mother was concerned about altered mental status.  AAO x3 and answering questions.  No focal neuro deficit.  CT head negative for acute finding.  Ammonia level mildly elevated at 42.  Unclear what his baseline mental status is.  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 -Holding aspirin at this time, pending surgery evaluation -Consult nephrology in a.m. for routine dialysis  ESRD on HD MWF No signs of volume overload at this time.  Bicarb 23, anion gap 16. -Consult nephrology in a.m. for routine dialysis  Non-insulin-dependent diabetes mellitus Blood glucose 286.   -Check A1c.  Sliding scale insulin with bedtime coverage.  CBG checks.  Hypertension -Currently normotensive.  Hydralazine PRN.  DVT prophylaxis: SCDs at this time, pending evaluation by surgery Code Status: Full code.  Discussed with the patient. Family Communication: No  family available. Disposition Plan: Anticipate discharge after clinical improvement. Consults called: General surgery Admission status: It is my clinical opinion that admission to INPATIENT is reasonable and necessary in this 58 y.o. male  presenting with abdominal pain, nausea, vomiting concerning for choledocholithiasis.  General surgery has been consulted.  MRCP ordered.  LFTs need to be monitored closely.  Will possibly need laparoscopic cholecystectomy during this hospitalization.  Given the aforementioned, the predictability of an adverse outcome is felt to be significant. I expect that the patient will require at least 2 midnights in the hospital to treat this condition.   The medical decision making on this patient was of high complexity and the patient is at high risk for clinical deterioration, therefore this is a level 3 visit.  Shela Leff MD [facility] Pager [phone]  If 7PM-7AM, please contact night-coverage [email or web address] Password [facility]  10/03/2018, 6:51 AM

## 2018-10-02 NOTE — ED Triage Notes (Signed)
Came in via ems; c/o generalized weakness the last few days; reported recent changes to DM meds; pt is on MWF HD; had a full session yesterday.

## 2018-10-02 NOTE — ED Notes (Addendum)
Error in charting, see updated SBAR

## 2018-10-02 NOTE — ED Notes (Signed)
Patient transported to X-ray 

## 2018-10-02 NOTE — ED Provider Notes (Signed)
[facility] DEPARTMENT Provider Note   CSN: [id number] Arrival date & time: 10/02/18  1717     History   Chief Complaint Chief Complaint  Patient presents with  . Hyperglycemia    HPI Tracen Mahler is a 58 y.o. male.     Patient is a 58 year old gentleman with past medical history of acute renal failure on dialysis, diabetes, hypertension, chronic memory loss who presents to the emergency department for altered mental status and vomiting.  Patient lives at home with his mother who takes care of him and takes him to his appointments as he does not drive.  Patient is a level 5 caveat due to altered mental status and previous learning disabilities.  Patient reports that he felt nauseated earlier today but that has passed.  Patient reports he felt like his brain was foggy but otherwise has no complaints.  Patients mother reports that since this morning patient has been "out of it".  She reports that he has been lethargic and sleepy and not fully answering her questions.  Reports that his last known well was sometime last night before going to bed.  Reports that sometimes last week he had a similar episode but this went away after a day and so they thought nothing of it.  Patient's mother reports that he did go into the bathroom and have one episode of vomiting today as well.  She also reports that he had blood glucose levels of over 400 which is abnormal for him.     Past Medical History:  Diagnosis Date  . Acute on chronic combined systolic and diastolic CHF, NYHA class 1 ([place]) 01/13/2015  . Arthritis   . Chronic kidney disease    MWF [name].  . Diabetes mellitus without complication ([place])    Type II  . GERD (gastroesophageal reflux disease)    at times  . Head injury with loss of consciousness ([place])   . History of kidney stones   . Hypertension   . Neuropathy   . Pulmonary hypertension ([facility])    PA peak pressure 41 mmHg 03/04/16 echo  . Varicose veins    . Varicose veins of lower extremities with complications 40/98/1191    Patient Active Problem List   Diagnosis Date Noted  . AKI (acute kidney injury) ([place]) 07/06/2016  . Chest pain at rest 07/06/2016  . Chest pain 07/06/2016  . CKD (chronic kidney disease), stage IV ([place]) 01/14/2015  . Diabetes mellitus type II, non insulin dependent ([place]) 01/14/2015  . Obesity 01/14/2015  . Accelerated hypertension 01/14/2015  . Hypokalemia 01/14/2015  . Anemia of chronic kidney failure 01/14/2015  . Chronic diastolic CHF (congestive heart failure) ([place]) 01/13/2015  . Hematuria 01/13/2015  . Varicose veins of lower extremities with complications 47/82/9562  . Chronic venous insufficiency 10/22/2014    Past Surgical History:  Procedure Laterality Date  . AV FISTULA PLACEMENT Left 06/01/2016   Procedure: ARTERIOVENOUS (AV) FISTULA CREATION;  Surgeon: [name], MD;  Location: [facility];  Service: Vascular;  Laterality: Left;  . AV FISTULA PLACEMENT Left 08/10/2017   Procedure: BRACHIOCEPHALIC ARTERIOVENOUS (AV) FISTULA CREATION LEFT UPPER EXTREMITY;  Surgeon: [name], MD;  Location: [facility] OR;  Service: Vascular;  Laterality: Left;  . COLONOSCOPY W/ POLYPECTOMY    . ENDOVENOUS ABLATION SAPHENOUS VEIN W/ LASER Left 07-23-2015   endovenous laser ablation left greater saphenous vein by [name]  . ENDOVENOUS ABLATION SAPHENOUS VEIN W/ LASER Right 10/15/2015   EVLA  right greater saphenous vein by [name]        Home Medications    Prior to Admission medications   Medication Sig Start Date End Date Taking? Authorizing Provider  atorvastatin (LIPITOR) 20 MG tablet TAKE 1 TABLET BY MOUTH ONCE DAILY AT BEDTIME FOR 30 DAYS 11/02/17   [provider]  b complex-vitamin c-folic acid (NEPHRO-VITE) 0.8 MG TABS tablet Take 1 tablet by mouth daily.    [provider]  cinacalcet (SENSIPAR) 30 MG tablet TAKE 1 TABLET BY MOUTH DAILY WITH DINNER (DO NOT TAKE LESS THAN 12  HOURS PRIOR TO DIALYSIS) 07/31/18   [provider]  Docusate Sodium (STOOL SOFTENER LAXATIVE PO) Take by mouth.    [provider]  ferric citrate (AURYXIA) 1 GM 210 MG(Fe) tablet Take 420 mg by mouth 3 (three) times daily with meals.    [provider]  gabapentin (NEURONTIN) 300 MG capsule Take 300 mg by mouth 2 (two) times daily.    [provider]  glipiZIDE (GLUCOTROL) 5 MG tablet Take 10 mg by mouth 2 (two) times daily before a meal.     [provider]  JANUVIA 50 MG tablet Take 100 mg by mouth daily. 07/04/17   [provider]  Omega-3 Fatty Acids (FISH OIL) 1000 MG CPDR Take by mouth.    [provider]  omeprazole (PRILOSEC) 20 MG capsule Take 20 mg by mouth daily. 11/25/17   [provider]  sevelamer carbonate (RENVELA) 800 MG tablet Take 1,600 mg by mouth 3 (three) times daily with meals.    [provider]    Family History Family History  Problem Relation Age of Onset  . Cancer Father     Social History Social History   Tobacco Use  . Smoking status: Former Smoker    Packs/day: 1.00    Years: 45.00    Pack years: 45.00    Types: Cigarettes    Quit date: 01/13/2015    Years since quitting: 3.7  . Smokeless tobacco: Never Used  Substance Use Topics  . Alcohol use: Yes    Alcohol/week: 1.0 standard drinks    Types: 1 Glasses of wine per week    Comment: rare occassion  . Drug use: No     Allergies   Codeine   Review of Systems Review of Systems  Unable to perform ROS: Mental status change  Constitutional: Negative for chills and fever.  Respiratory: Negative for cough and shortness of breath.   Cardiovascular: Negative for chest pain.  Gastrointestinal: Positive for diarrhea and vomiting.  Neurological: Negative for light-headedness.     Physical Exam Updated Vital Signs BP 134/62 (BP Location: Right Arm)   Pulse 85   Temp 99.6 F (37.6 C)   Ht 5' 8"  (1.727 m)   Wt  95.3 kg   SpO2 97%   BMI 31.93 kg/m   Physical Exam Vitals signs and nursing note reviewed. Exam conducted with a chaperone present.  Constitutional:      General: He is not in acute distress.    Appearance: He is not ill-appearing, toxic-appearing or diaphoretic.  HENT:     Head: Normocephalic and atraumatic.  Eyes:     Pupils: Pupils are equal, round, and reactive to light.  Cardiovascular:     Rate and Rhythm: Normal rate and regular rhythm.     Pulses: Normal pulses.     Heart sounds: Normal heart sounds.  Pulmonary:     Effort: Pulmonary  effort is normal.     Breath sounds: Normal breath sounds.  Abdominal:     General: Abdomen is flat. There is distension.     Tenderness: There is no abdominal tenderness. There is no right CVA tenderness, left CVA tenderness or guarding.  Skin:    General: Skin is warm.     Coloration: Skin is not pale.     Findings: No erythema or rash.  Neurological:     Mental Status: He is alert.  Psychiatric:     Comments: Patient appears lethargic and falls asleep while I am talking to him.  Easily awakened      ED Treatments / Results  Labs (all labs ordered are listed, but only abnormal results are displayed) Labs Reviewed  CBG MONITORING, ED - Abnormal; Notable for the following components:      Result Value   Glucose-Capillary 278 (*)    All other components within normal limits  CBC WITH DIFFERENTIAL/PLATELET  COMPREHENSIVE METABOLIC PANEL  URINALYSIS, ROUTINE W REFLEX MICROSCOPIC  LIPASE, BLOOD    EKG EKG Interpretation  Date/Time:  Tuesday October 02 2018 17:48:03 EDT Ventricular Rate:  90 PR Interval:    QRS Duration: 100 QT Interval:  367 QTC Calculation: 449 R Axis:   48 Text Interpretation:  Sinus rhythm No STEMI  Confirmed by Octaviano Glow [phone]) on 10/02/2018 5:51:33 PM   Radiology No results found.  Procedures Procedures (including critical care time)  Medications Ordered in ED Medications - No data to  display   Initial Impression / Assessment and Plan / ED Course  I have reviewed the triage vital signs and the nursing notes.  Pertinent labs & imaging results that were available during my care of the patient were reviewed by me and considered in my medical decision making (see chart for details).  Clinical Course as of Oct 01 2336  Tue Oct 02, 2018  7858 Level 3 caveat 58 year old male presenting with nausea vomiting and lethargy since this morning.  Significantly elevated AST, ALT and alk phos with distention in his belly.  Afebrile and hemodynamically stable.  Patient will be getting CT scan of his abdomen and pelvis.  Also performing head CT scan as he is more altered than usual per his mother and has recently started aspirin.   [KM]  2012 On my exam the patient   [MT]  2202 I spoke with general surgery who stated that this patient should be admitted to medicine to have MRCP for possible choledocholithiasis.  They will consult on the patient after repeat labs showed decrease in bilirubin.   [KM]  2236 Consulted with hospitalist for admission for further workup.   [KM]    Clinical Course User Index [KM] Alveria Apley, PA-C [MT] Wyvonnia Dusky, MD         Final Clinical Impressions(s) / ED Diagnoses   Final diagnoses:  None    ED Discharge Orders    None       Kristine Royal 10/02/18 2339    Wyvonnia Dusky, MD 10/03/18 [phone]

## 2018-10-03 ENCOUNTER — Inpatient Hospital Stay (HOSPITAL_COMMUNITY): Payer: Medicare Other

## 2018-10-03 ENCOUNTER — Encounter (HOSPITAL_COMMUNITY): Payer: Self-pay

## 2018-10-03 DIAGNOSIS — Z992 Dependence on renal dialysis: Secondary | ICD-10-CM

## 2018-10-03 DIAGNOSIS — N186 End stage renal disease: Secondary | ICD-10-CM

## 2018-10-03 DIAGNOSIS — K8050 Calculus of bile duct without cholangitis or cholecystitis without obstruction: Secondary | ICD-10-CM

## 2018-10-03 LAB — COMPREHENSIVE METABOLIC PANEL
ALT: 265 U/L — ABNORMAL HIGH (ref 0–44)
AST: 216 U/L — ABNORMAL HIGH (ref 15–41)
Albumin: 3.2 g/dL — ABNORMAL LOW (ref 3.5–5.0)
Alkaline Phosphatase: 736 U/L — ABNORMAL HIGH (ref 38–126)
Anion gap: 15 (ref 5–15)
BUN: 51 mg/dL — ABNORMAL HIGH (ref 6–20)
CO2: 26 mmol/L (ref 22–32)
Calcium: 8.5 mg/dL — ABNORMAL LOW (ref 8.9–10.3)
Chloride: 95 mmol/L — ABNORMAL LOW (ref 98–111)
Creatinine, Ser: 8.71 mg/dL — ABNORMAL HIGH (ref 0.61–1.24)
GFR calc Af Amer: 7 mL/min — ABNORMAL LOW (ref >60)
GFR calc non Af Amer: 6 mL/min — ABNORMAL LOW (ref >60)
Glucose, Bld: 109 mg/dL — ABNORMAL HIGH (ref 70–99)
Potassium: 4.4 mmol/L (ref 3.5–5.1)
Sodium: 136 mmol/L (ref 135–145)
Total Bilirubin: 3.7 mg/dL — ABNORMAL HIGH (ref 0.3–1.2)
Total Protein: 7.1 g/dL (ref 6.5–8.1)

## 2018-10-03 LAB — CBG MONITORING, ED: Glucose-Capillary: 104 mg/dL — ABNORMAL HIGH (ref 70–99)

## 2018-10-03 LAB — MRSA PCR SCREENING: MRSA by PCR: NEGATIVE

## 2018-10-03 MED ORDER — DOXERCALCIFEROL 4 MCG/2ML IV SOLN
10.0000 ug | INTRAVENOUS | Status: DC
  Administered 2018-10-03 – 2018-10-05 (×2): 10 ug via INTRAVENOUS
  Filled 2018-10-03 (×2): qty 6

## 2018-10-03 MED ORDER — INFLUENZA VAC SPLIT QUAD 0.5 ML IM SUSY
0.5000 mL | PREFILLED_SYRINGE | INTRAMUSCULAR | Status: AC
  Administered 2018-10-04: 0.5 mL via INTRAMUSCULAR
  Filled 2018-10-03: qty 0.5

## 2018-10-03 MED ORDER — ACETAMINOPHEN 650 MG RE SUPP: 650.0000 mg | Freq: Four times a day (QID) | RECTAL | Status: DC | PRN

## 2018-10-03 MED ORDER — HYDRALAZINE HCL 20 MG/ML IJ SOLN: 5.0000 mg | INTRAMUSCULAR | Status: DC | PRN

## 2018-10-03 MED ORDER — ACETAMINOPHEN 325 MG PO TABS: 650.0000 mg | ORAL_TABLET | Freq: Four times a day (QID) | ORAL | Status: DC | PRN

## 2018-10-03 MED ORDER — HEPARIN SODIUM (PORCINE) 5000 UNIT/ML IJ SOLN
5000.0000 [IU] | Freq: Three times a day (TID) | INTRAMUSCULAR | Status: DC
  Administered 2018-10-03 – 2018-10-06 (×8): 5000 [IU] via SUBCUTANEOUS
  Filled 2018-10-03 (×7): qty 1

## 2018-10-03 MED ORDER — ONDANSETRON HCL 4 MG/2ML IJ SOLN: 4.0000 mg | Freq: Four times a day (QID) | INTRAMUSCULAR | Status: DC | PRN

## 2018-10-03 MED ORDER — CHLORHEXIDINE GLUCONATE CLOTH 2 % EX PADS
6.0000 | MEDICATED_PAD | Freq: Every day | CUTANEOUS | Status: DC
  Administered 2018-10-04: 6 via TOPICAL

## 2018-10-03 NOTE — Progress Notes (Signed)
Patient ID: Edgar Brewer, male   DOB: 1960/06/28, 58 y.o.   MRN: [id number] Chart reviewed, patient asleep when I tried to see him this am.  Will return later this am Agree with repeat lfts this am.  Sounds like may need lap chole at some point. Cardiology will need to see for periop recs

## 2018-10-03 NOTE — Consult Note (Addendum)
[facility] Consult Note  Edgar Brewer 11/04/1960  [id number].    Requesting MD: Dr. Nevada Crane Chief Complaint: AMS, Hyperglycemia, N/V Reason for Consult: Elevated LFT's, Gallstones  HPI: Edgar Brewer is a 58 y.o. male with a history of ESRD on HD, CHF, pulmonary hypertension, DM2 and chronic memory loss (per his chart) who presented to [facility] for altered mental status, nausea, vomiting and hyperglycemia.  Work-up revealed elevated LFTs as well as gallstones for which general surgery was consulted.  History obtained by patient as well as chart review.  Patient reports for the last 3-4 days his CBGs have been running high, around 400-480.  He reports that he normally runs in the low 200s.  He thought this was what was causing him to have nausea throughout the day as well as 1-2 episodes of emesis per day.  It appears that as his symptoms progressed he became altered and was taken to the emergency department by his mother.  The patient denies any abdominal pain since onset of symptoms but does report a "gurgling and fullness" sensation in his epigastrium that is non-radiating and  occurs several minutes before he vomits.  He reports this sensation resolves immediately after emesis.  He denies radiation of the symptoms. He denies a history of pain with eating or biliary colic.  He denies any fever, chills, chest pain, shortness of breath, flank pain, dysuria or change in bowel habits.  Patient is on blood thinners.  He denies history of prior abdominal surgeries.  He is currently asymptomatic and reports that he is hungry.   Hospital Course: He presented afebrile with stable vital signs.  WBC 8.7.  CT abdomen pelvis with evidence of cholelithiasis without evidence of cholecystitis.  RUQ [place] with sludge and multiple small stones in the gallbladder measuring up to 1.4 cm, with an increased wall thickness at 5.7 mm. CBD 2.85m.  Admission labs: alk phos 762, AST 308, ALT 306, T bili 3.6, lipase 74.  Repeat labs this AM with downtrending AST (216), ALT (265), Alk Phos (736). T bili noted to be slightly up at 3.7. Lipase and WBC not repeated. MRCP ordered by hospitalist. He is not currently on abx or requiring any pain medication.   ROS: Review of Systems  Constitutional: Negative for chills and fever.  Respiratory: Negative for cough and shortness of breath.   Cardiovascular: Negative for chest pain.  Gastrointestinal: Positive for nausea and vomiting. Negative for abdominal pain, constipation and diarrhea.  Genitourinary: Negative for dysuria and frequency.  All other systems reviewed and are negative. All systems reviewed and otherwise negative except for as above  Family History  Problem Relation Age of Onset  . Cancer Father     Past Medical History:  Diagnosis Date  . Acute on chronic combined systolic and diastolic CHF, NYHA class 1 ([facility] 01/13/2015  . Arthritis   . Chronic kidney disease    MWF [facility]  . Diabetes mellitus without complication ([facility]    Type II  . GERD (gastroesophageal reflux disease)    at times  . [place] of consciousness [place]   . History of kidney stones   . Hypertension   . Neuropathy   . Pulmonary hypertension (HCC)    PA peak pressure 41 mmHg 03/04/16 echo  . Varicose veins   . Varicose veins of lower extremities with complications 175/17/0017   Past Surgical History:  Procedure Laterality Date  . AV FISTULA PLACEMENT Left 06/01/2016   Procedure:  ARTERIOVENOUS (AV) FISTULA CREATION;  Surgeon: [name], MD;  Location[facility] OR;  Service: Vascular;  Laterality: Left;  . AV FISTULA PLACEMENT Left 08/10/2017   Procedure: BRACHIOCEPHALIC ARTERIOVENOUS (AV) FISTULA CREATION LEFT UPPER EXTREMITY;  Surgeon: [name], MD;  Location: [facility] OR;  Service: Vascular;  Laterality: Left;  . COLONOSCOPY W/ POLYPECTOMY    . ENDOVENOUS ABLATION SAPHENOUS VEIN W/ LASER Left 07-23-2015   endovenous laser ablation left greater  saphenous vein by [name]  . ENDOVENOUS ABLATION SAPHENOUS VEIN W/ LASER Right 10/15/2015   EVLA right greater saphenous vein by [name]    Social History:  reports that he quit smoking about 3 years ago. His smoking use included cigarettes. He has a 45.00 pack-year smoking history. He has never used smokeless tobacco. He reports current alcohol use of about 1.0 standard drinks of alcohol per week. He reports that he does not use drugs.  Patient is a 45-pack-year history.  He reports he quit smoking 3 years ago. He drinks an occasional alcoholic beverage, approximately 1-2 drinks per week He denies any illicit drug use Patient lives at home with his mother who helps take care of him Patient is currently not employed, and states that he is on disability  Allergies:  Allergies  Allergen Reactions  . Codeine Hives, Swelling and Other (See Comments)    Swelling all over body     Medications Prior to Admission  Medication Sig Dispense Refill  . atorvastatin (LIPITOR) 20 MG tablet Take 20 mg by mouth daily.   3  . b complex-vitamin c-folic acid (NEPHRO-VITE) 0.8 MG TABS tablet Take 1 tablet by mouth daily.    . cinacalcet (SENSIPAR) 30 MG tablet Take 30 mg by mouth daily.     Mariane Baumgarten Sodium (STOOL SOFTENER LAXATIVE PO) Take 1 tablet by mouth daily.     . ferric citrate (AURYXIA) 1 GM 210 MG(Fe) tablet Take 420 mg by mouth 3 (three) times daily with meals.    . gabapentin (NEURONTIN) 300 MG capsule Take 300 mg by mouth 2 (two) times daily.    Marland Kitchen glipiZIDE (GLUCOTROL) 5 MG tablet Take 10 mg by mouth daily.     Marland Kitchen linagliptin (TRADJENTA) 5 MG TABS tablet Take 5 mg by mouth daily.    . Omega-3 Fatty Acids (FISH OIL) 1000 MG CPDR Take by mouth.    Marland Kitchen omeprazole (PRILOSEC) 20 MG capsule Take 20 mg by mouth daily.  2  . sevelamer carbonate (RENVELA) 800 MG tablet Take 1,600 mg by mouth 3 (three) times daily with meals.      Prior to Admission medications   Medication Sig Start Date  End Date Taking? Authorizing Provider  atorvastatin (LIPITOR) 20 MG tablet Take 20 mg by mouth daily.  11/02/17  Yes [provider]  b complex-vitamin c-folic acid (NEPHRO-VITE) 0.8 MG TABS tablet Take 1 tablet by mouth daily.   Yes [provider]  cinacalcet (SENSIPAR) 30 MG tablet Take 30 mg by mouth daily.  07/31/18  Yes [provider]  Docusate Sodium (STOOL SOFTENER LAXATIVE PO) Take 1 tablet by mouth daily.    Yes [provider]  ferric citrate (AURYXIA) 1 GM 210 MG(Fe) tablet Take 420 mg by mouth 3 (three) times daily with meals.   Yes [provider]  gabapentin (NEURONTIN) 300 MG capsule Take 300 mg by mouth 2 (two) times daily.   Yes [provider]  glipiZIDE (GLUCOTROL) 5 MG tablet Take 10  mg by mouth daily.    Yes [provider]  linagliptin (TRADJENTA) 5 MG TABS tablet Take 5 mg by mouth daily.   Yes [provider]  Omega-3 Fatty Acids (FISH OIL) 1000 MG CPDR Take by mouth.   Yes [provider]  omeprazole (PRILOSEC) 20 MG capsule Take 20 mg by mouth daily. 11/25/17  Yes [provider]  sevelamer carbonate (RENVELA) 800 MG tablet Take 1,600 mg by mouth 3 (three) times daily with meals.   Yes [provider]    Blood pressure 121/61, pulse 79, temperature 98.4 F (36.9 C), temperature source Oral, resp. rate 16, height _0  (1.727 m), weight 91.7 kg, SpO2 97 %. Physical Exam: General: pleasant, WD/WN white male who is laying in bed in NAD HEENT: head is normocephalic, atraumatic.  Sclera are noninjected. No scleral icterus. Pupils equal and round.  Ears and nose without any masses or lesions.  Mouth is pink and moist. Dentition fair Heart: regular, rate, and rhythm.  No obvious murmurs, gallops, or rubs noted.  Palpable pedal pulses bilaterally Lungs: CTAB, no wheezes, rhonchi, or rales noted.  Respiratory effort nonlabored Abd: Soft, protuberant, mild distension, non-tender with  light palpation. Patient squints his eyes with deep palpation of the RUQ but reports this is non-tender. +BS, no masses, hernias, or organomegaly. No prior abdominal scars. MS: LUE AV fistula with good thrill. All 4 extremities are symmetrical with no cyanosis, clubbing, or edema. Skin: warm and dry with no masses, lesions, or rashes Psych: A&Ox3 with an appropriate affect. Neuro: cranial nerves grossly intact, extremity CSM intact bilaterally, normal speech  Results for orders placed or performed during the hospital encounter of 10/02/18 (from the past 48 hour(s))  CBG monitoring, ED     Status: Abnormal   Collection Time: 10/02/18  5:19 PM  Result Value Ref Range   Glucose-Capillary 278 (H) 70 - 99 mg/dL   Comment 1 Notify RN    Comment 2 Document in Chart   CBC with Differential     Status: Abnormal   Collection Time: 10/02/18  5:26 PM  Result Value Ref Range   WBC 8.7 4.0 - 10.5 K/uL   RBC 3.85 (L) 4.22 - 5.81 MIL/uL   Hemoglobin 11.4 (L) 13.0 - 17.0 g/dL   HCT 34.8 (L) 39.0 - 52.0 %   MCV 90.4 80.0 - 100.0 fL   MCH 29.6 26.0 - 34.0 pg   MCHC 32.8 30.0 - 36.0 g/dL   RDW 13.9 11.5 - 15.5 %   Platelets 192 150 - 400 K/uL   nRBC 0.0 0.0 - 0.2 %   Neutrophils Relative % 87 %   Neutro Abs 7.7 1.7 - 7.7 K/uL   Lymphocytes Relative 4 %   Lymphs Abs 0.4 (L) 0.7 - 4.0 K/uL   Monocytes Relative 7 %   Monocytes Absolute 0.6 0.1 - 1.0 K/uL   Eosinophils Relative 0 %   Eosinophils Absolute 0.0 0.0 - 0.5 K/uL   Basophils Relative 1 %   Basophils Absolute 0.0 0.0 - 0.1 K/uL   Immature Granulocytes 1 %   Abs Immature Granulocytes 0.06 0.00 - 0.07 K/uL    Comment: Performed at [facility], [address] [place], [place] [address]  Comprehensive metabolic panel     Status: Abnormal   Collection Time: 10/02/18  5:26 PM  Result Value Ref Range   Sodium 134 (L) 135 - 145 mmol/L   Potassium 5.1 3.5 - 5.1 mmol/L  Chloride 95 (L) 98 - 111 mmol/L   CO2 23 22 - 32 mmol/L   Glucose,  Bld 286 (H) 70 - 99 mg/dL   BUN 45 (H) 6 - 20 mg/dL   Creatinine, Ser 7.91 (H) 0.61 - 1.24 mg/dL   Calcium 8.3 (L) 8.9 - 10.3 mg/dL   Total Protein 7.7 6.5 - 8.1 g/dL   Albumin 3.6 3.5 - 5.0 g/dL   AST 308 (H) 15 - 41 U/L   ALT 306 (H) 0 - 44 U/L   Alkaline Phosphatase 762 (H) 38 - 126 U/L   Total Bilirubin 3.6 (H) 0.3 - 1.2 mg/dL   GFR calc non Af Amer 7 (L) >60 mL/min   GFR calc Af Amer 8 (L) >60 mL/min   Anion gap 16 (H) 5 - 15    Comment: Performed at [facility], [address], [place], [place] [address]  Lipase, blood     Status: Abnormal   Collection Time: 10/02/18  6:22 PM  Result Value Ref Range   Lipase 74 (H) 11 - 51 U/L    Comment: Performed at [place] [address], [place], [place] [address]  Urinalysis, Routine w reflex microscopic     Status: Abnormal   Collection Time: 10/02/18  9:17 PM  Result Value Ref Range   Color, Urine AMBER (A) YELLOW    Comment: BIOCHEMICALS MAY BE AFFECTED BY COLOR   APPearance HAZY (A) CLEAR   Specific Gravity, Urine 1.022 1.005 - 1.030   pH 5.0 5.0 - 8.0   Glucose, UA 150 (A) NEGATIVE mg/dL   Hgb urine dipstick MODERATE (A) NEGATIVE   Bilirubin Urine NEGATIVE NEGATIVE   Ketones, ur NEGATIVE NEGATIVE mg/dL   Protein, ur >=300 (A) NEGATIVE mg/dL   Nitrite NEGATIVE NEGATIVE   Leukocytes,Ua NEGATIVE NEGATIVE   RBC / HPF 6-10 0 - 5 RBC/hpf   WBC, UA 6-10 0 - 5 WBC/hpf   Bacteria, UA RARE (A) NONE SEEN   Squamous Epithelial / LPF 0-5 0 - 5   Mucus PRESENT    Hyaline Casts, UA PRESENT     Comment: Performed at [facility], [address] [place], [place] [address]  SARS Coronavirus 2 [facility] order, Performed in [facility] hospital lab) Nasopharyngeal Nasopharyngeal Swab     Status: None   Collection Time: 10/02/18  9:48 PM   Specimen: Nasopharyngeal Swab  Result Value Ref Range   SARS Coronavirus 2 NEGATIVE NEGATIVE    Comment: (NOTE) If result is NEGATIVE SARS-CoV-2 target nucleic acids are NOT  DETECTED. The SARS-CoV-2 RNA is generally detectable in upper and lower  respiratory specimens during the acute phase of infection. The lowest  concentration of SARS-CoV-2 viral copies this assay can detect is 250  copies / mL. A negative result does not preclude SARS-CoV-2 infection  and should not be used as the sole basis for treatment or other  patient management decisions.  A negative result may occur with  improper specimen collection / handling, submission of specimen other  than nasopharyngeal swab, presence of viral mutation(s) within the  areas targeted by this assay, and inadequate number of viral copies  (<250 copies / mL). A negative result must be combined with clinical  observations, patient history, and epidemiological information. If result is POSITIVE SARS-CoV-2 target nucleic acids are DETECTED. The SARS-CoV-2 RNA is generally detectable in upper and lower  respiratory specimens dur ing the acute phase of infection.  Positive  results  are indicative of active infection with SARS-CoV-2.  Clinical  correlation with patient history and other diagnostic information is  necessary to determine patient infection status.  Positive results do  not rule out bacterial infection or co-infection with other viruses. If result is PRESUMPTIVE POSTIVE SARS-CoV-2 nucleic acids MAY BE PRESENT.   A presumptive positive result was obtained on the submitted specimen  and confirmed on repeat testing.  While 2019 novel coronavirus  (SARS-CoV-2) nucleic acids may be present in the submitted sample  additional confirmatory testing may be necessary for epidemiological  and / or clinical management purposes  to differentiate between  SARS-CoV-2 and other Sarbecovirus currently known to infect humans.  If clinically indicated additional testing with an alternate test  methodology [phone]) is advised. The SARS-CoV-2 RNA is generally  detectable in upper and lower respiratory sp ecimens during  the acute  phase of infection. The expected result is Negative. Fact Sheet for Patients:  StrictlyIdeas.no Fact Sheet for Healthcare Providers: BankingDealers.co.za This test is not yet approved or cleared by the [place] FDA and has been authorized for detection and/or diagnosis of SARS-CoV-2 by FDA under an Emergency Use Authorization (EUA).  This EUA will remain in effect (meaning this test can be used) for the duration of the COVID-19 declaration under Section 564(b)(1) of the Act, 21 U.S.C. section 360bbb-3(b)(1), unless the authorization is terminated or revoked sooner. Performed at [facility], [place] [address] [place], [place] [address]   Ammonia     Status: Abnormal   Collection Time: 10/02/18 10:01 PM  Result Value Ref Range   Ammonia 42 (H) 9 - 35 umol/L    Comment: Performed at [facility], [place] [address], [place], [place] [address]  CBG monitoring, ED     Status: Abnormal   Collection Time: 10/03/18  1:55 AM  Result Value Ref Range   Glucose-Capillary 104 (H) 70 - 99 mg/dL  Comprehensive metabolic panel     Status: Abnormal   Collection Time: 10/03/18  5:30 AM  Result Value Ref Range   Sodium 136 135 - 145 mmol/L   Potassium 4.4 3.5 - 5.1 mmol/L   Chloride 95 (L) 98 - 111 mmol/L   CO2 26 22 - 32 mmol/L   Glucose, Bld 109 (H) 70 - 99 mg/dL   BUN 51 (H) 6 - 20 mg/dL   Creatinine, Ser 8.71 (H) 0.61 - 1.24 mg/dL   Calcium 8.5 (L) 8.9 - 10.3 mg/dL   Total Protein 7.1 6.5 - 8.1 g/dL   Albumin 3.2 (L) 3.5 - 5.0 g/dL   AST 216 (H) 15 - 41 U/L   ALT 265 (H) 0 - 44 U/L   Alkaline Phosphatase 736 (H) 38 - 126 U/L   Total Bilirubin 3.7 (H) 0.3 - 1.2 mg/dL   GFR calc non Af Amer 6 (L) >60 mL/min   GFR calc Af Amer 7 (L) >60 mL/min   Anion gap 15 5 - 15    Comment: Performed at [facility], [place] [address], [place], [place] [address]   Ct Head Wo Contrast  Result Date: 10/02/2018 CLINICAL DATA:   Altered level of consciousness EXAM: CT HEAD WITHOUT CONTRAST TECHNIQUE: Contiguous axial images were obtained from the base of the skull through the vertex without intravenous contrast. COMPARISON:  None. FINDINGS: Brain: No evidence of acute territorial infarction, hemorrhage, hydrocephalus,extra-axial collection or mass lesion/mass effect. Normal gray-white differentiation. Ventricles are normal in size and contour. Vascular: No hyperdense vessel  or unexpected calcification. Skull: The skull is intact. No fracture or focal lesion identified. Sinuses/Orbits: The visualized paranasal sinuses and mastoid air cells are clear. The orbits and globes intact. Other: None IMPRESSION: No acute intracranial pathology. Electronically Signed   By: Prudencio Pair M.D.   On: 10/02/2018 19:55   Ct Abdomen Pelvis W Contrast  Result Date: 10/02/2018 CLINICAL DATA:  Abdominal distention, nausea, vomiting. Question gallbladder disease. EXAM: CT ABDOMEN AND PELVIS WITH CONTRAST TECHNIQUE: Multidetector CT imaging of the abdomen and pelvis was performed using the standard protocol following bolus administration of intravenous contrast. CONTRAST:  112m OMNIPAQUE IOHEXOL 300 MG/ML  SOLN COMPARISON:  None. FINDINGS: Lower chest: Cardiomegaly.  No confluent opacities or effusions. Hepatobiliary: Multiple gallstones layering within the gallbladder. No focal hepatic abnormality or biliary ductal dilatation. No visible gallbladder wall thickening. Pancreas: No focal abnormality or ductal dilatation. Spleen: No focal abnormality.  Normal size. Adrenals/Urinary Tract: No adrenal abnormality. No focal renal abnormality. No stones or hydronephrosis. Urinary bladder is unremarkable. Stomach/Bowel: Colonic diverticulosis. No active diverticulitis. Stomach and small bowel decompressed, unremarkable. Vascular/Lymphatic: No evidence of aneurysm or adenopathy. Aortic atherosclerosis. Reproductive: No visible focal abnormality. Other: No free fluid  or free air. Musculoskeletal: No acute bony abnormality. IMPRESSION: Colonic diverticulosis crash that cholelithiasis. No CT evidence for cholecystitis. Colonic diverticulosis.  No diverticulitis. Aortic atherosclerosis. No acute findings in the abdomen or pelvis. Electronically Signed   By: KRolm BaptiseM.D.   On: 10/02/2018 19:57   Dg Abdomen Acute W/chest  Result Date: 10/02/2018 CLINICAL DATA: Nausea and vomiting EXAM: DG ABDOMEN ACUTE W/ 1V CHEST COMPARISON:  Chest x-ray 07/06/2016 FINDINGS: Single-view chest demonstrates no focal airspace disease or effusion. Cardiomediastinal silhouette within normal limits. No pneumothorax. No free air beneath the diaphragm. Borderline to mild gaseous distention of central small bowel loops with scattered colon gas present. IMPRESSION: 1. No radiographic evidence for acute cardiopulmonary abnormality. 2. Borderline to mild gaseous dilatation of central small bowel though with scattered colon gas present, findings could be secondary to enteritis or ileus, however partial or developing bowel obstruction could also produce this appearance. Electronically Signed   By: KDonavan FoilM.D.   On: 10/02/2018 18:58   UK[place]Abdomen Limited Ruq  Result Date: 10/02/2018 CLINICAL DATA:  Elevated LFTs EXAM: ULTRASOUND ABDOMEN LIMITED RIGHT UPPER QUADRANT COMPARISON:  CT 10/02/2018 FINDINGS: Gallbladder: Sludge in multiple small stones in the gallbladder measuring up to 1.4 cm. Increased wall thickness at 5.7 mm. Negative sonographic Murphy. Common bile duct: Diameter: 2.9 mm Liver: Increased hepatic echogenicity without focal hepatic abnormality. Portal vein is patent on color Doppler imaging with normal direction of blood flow towards the liver. Other: None. IMPRESSION: 1. Sludge and stones within the gallbladder with increased wall thickness but negative sonographic Murphy. Findings are nonspecific and could be secondary to acute or chronic cholecystitis, liver disease, or edema  forming states. If acute gallbladder disease remains a concern, further evaluation with hepatobiliary nuclear medicine imaging could be obtained. 2. Slightly echogenic liver suggesting steatosis Electronically Signed   By: KDonavan FoilM.D.   On: 10/02/2018 21:35   Anti-infectives (From admission, onward)   None      Assessment/Plan ESRD on HD CHF Pulmonary HTN DM2  Anion Gap Acidosis  Hyperglycemia  - Above per TRH -   Elevated LFT's Gallstones - Patient does not report symptoms consistent with biliary colic.  He is afebrile with normal WBC on admission.  He does have gallstones and elevated LFTs however. AST, ALT, and  Alk Phos are downtrending this morning.  T bili is slightly up from 3.6 to 3.7.  He is currently asymptomatic.  His abdominal exam is benign with no reported tenderness however does squint with deep palpation of the right upper quadrant.  There is no peritonitis.  No indication for emergent surgery.  Will follow up on MRCP.  He may require a HIDA scan depending on these results.  If the patient was found to have acute cholecystitis and did require surgery, he would need cardiac clearance prior to this.  Can continue to hold off on abx at this time. We will continue to follow.   FEN: NPO ID: None VTE: SCDs, okay for chemical prophylaxis from a general surgery standpoint  Jillyn Ledger, [facility] [place] [facility] 10/03/2018, 7:51 AM Pager: [phone]

## 2018-10-03 NOTE — Progress Notes (Signed)
PROGRESS NOTE  Edgar Brewer JAS:[id number] DOB: 07-Dec-1960 DOA: 10/02/2018 PCP: System, Pcp Not In  HPI/Recap of past 24 hours:   Edgar Brewer is a 58 y.o. male with medical history significant of ESRD on HD, chronic combined systolic and diastolic congestive heart failure, pulmonary hypertension, type 2 diabetes, hypertension, chronic memory loss presenting to the hospital for evaluation of altered mental status and vomiting.  Patient is a poor historian and it was difficult to obtain a thorough history from him.  States for the past 4 to 5 days he has had some episodes of vomiting in the morning however has been able to eat food.  He has also been having intermittent abdominal pain for the past few days which he describes as epigastric and radiating to the right upper quadrant.  He is not sure if his abdominal pain is associated with eating.  Reports having loose stools.  Denies history of gallbladder problems.  Denies any fevers or chills.  Denies any chest pain, shortness of breath, or cough.  No other complaints.  ED Course: Vital signs stable on arrival.  No leukocytosis.  Hemoglobin 11.4, at baseline.  Blood glucose 286.  Bicarb 23, anion gap 16.  LFTs elevated-AST 308, ALT 306, alk phos 762, and T bili 3.6.  Lipase mildly elevated at 74.  UA with negative nitrite, negative leukocyte esterase, 6-10 WBCs, and rare bacteria on microscopic examination.  Ammonia level pending.  SARS-CoV-2 test pending.  Chest x-ray showing no acute cardiopulmonary abnormality.  Abdominal x-ray showing borderline to mild gaseous dilatation of central small bowel though with scattered colon gas present, findings could be secondary to enteritis or ileus however partial or developing bowel obstruction could also produce this appearance.  CT abdomen pelvis showing evidence of cholelithiasis but no cholecystitis.  No focal hepatic abnormality or biliary ductal dilatation.  Also showing colonic diverticulosis but no active  diverticulitis.  Stomach and small bowel decompressed, unremarkable.  No acute finding in the abdomen or pelvis.  Right upper quadrant ultrasound showing sludge and stones within the gallbladder with increased wall thickness but negative sonographic Murphy sign.  Findings nonspecific and thought to be secondary to acute on chronic cholecystitis, liver disease, or edema forming states.  Slight echogenic liver suggesting steatosis.  CT head showing no acute intracranial pathology. Patient received no medications in the ED.  10/03/18: Patient was seen and examined at his bedside this morning.  Reports his abdominal pain and nausea are improving with current management.  Denies any cardiopulmonary symptoms.  MRCP ordered and pending  Assessment/Plan: Principal Problem:   Choledocholithiasis Active Problems:   Diabetes mellitus type II, non insulin dependent (HCC)   Accelerated hypertension   ESRD (end stage renal disease) on dialysis (HCC)   Acute transaminitis in the setting of gallstones/suspected choledocholithiasis Afebrile with no leukocytosis No tenderness w palpation on physical exam General surgery has been consulted MRCP ordered and pending Possible HIDA scan depending on MRCP results Repeat CMP tomorrow  Hyperbilirubinemia Presented with T bili 3.6 MRCP pending Repeat levels in the morning  Mildly elevated ammonia in the setting of hepatic steatosis Ammonia level 42 Asymptomatic Repeat in the morning  Elevated lipase level  Presented with lipase 94 No tenderness on palpation  Repeat in the morning  ESRD on HD MWF Seen by nephrology For hemodialysis on 10/03/2018 to keep on schedule  Essential hypertension Blood pressures currently at goal Continue IV hydralazine as needed  Non-insulin-dependent type 2 diabetes Obtain hemoglobin A1c Continue sensitive SSI  Avoid hypoglycemia in the setting of end-stage renal disease    DVT prophylaxis: SCDs at this time, pending  evaluation by surgery Code Status: Full code.  Discussed with the patient. Family Communication: No family available.    Disposition Plan:  Patient is currently not appropriate for discharge due to ongoing work-up for acute transaminitis and hyperbilirubinemia.  Patient will require at least 2 midnights for further evaluation and treatment of present condition.     Consults called: General surgery, nephrology     Objective: Vitals:   10/03/18 0523 10/03/18 1155 10/03/18 1350 10/03/18 1403  BP: 121/61 (!) 129/56 (!) 143/80 137/71  Pulse: 79 80 82 84  Resp: _0 Temp: 98.4 F (36.9 C) 98.2 F (36.8 C) 98.3 F (36.8 C)   TempSrc: Oral Oral Oral   SpO2: 97% 94%    Weight:      Height:        Intake/Output Summary (Last 24 hours) at 10/03/2018 1413 Last data filed at 10/02/2018 1745 Gross per 24 hour  Intake 0 ml  Output 0 ml  Net 0 ml   Filed Weights   10/02/18 1723 10/03/18 0230  Weight: 95.3 kg 91.7 kg    Exam:   General: 58 y.o. year-old male well developed well nourished in no acute distress.  Alert and oriented x3.  Cardiovascular: Regular rate and rhythm with no rubs or gallops.  No thyromegaly or JVD noted.    Respiratory: Clear to auscultation with no wheezes or rales. Good inspiratory effort.  Abdomen: Soft mildly distended nontender with hypoactive bowel sounds x4 quadrants.  Musculoskeletal: Trace lower extremity edema. 2/4 pulses in all 4 extremities.  Psychiatry: Mood is appropriate for condition and setting   Data Reviewed: CBC: Recent Labs  Lab 10/02/18 1726  WBC 8.7  NEUTROABS 7.7  HGB 11.4*  HCT 34.8*  MCV 90.4  PLT 419   Basic Metabolic Panel: Recent Labs  Lab 10/02/18 1726 10/03/18 0530  NA 134* 136  K 5.1 4.4  CL 95* 95*  CO2 23 26  GLUCOSE 286* 109*  BUN 45* 51*  CREATININE 7.91* 8.71*  CALCIUM 8.3* 8.5*   GFR: Estimated Creatinine Clearance: 10 mL/min (A) (by C-G formula based on SCr of 8.71 mg/dL (H)). Liver  Function Tests: Recent Labs  Lab 10/02/18 1726 10/03/18 0530  AST 308* 216*  ALT 306* 265*  ALKPHOS 762* 736*  BILITOT 3.6* 3.7*  PROT 7.7 7.1  ALBUMIN 3.6 3.2*   Recent Labs  Lab 10/02/18 1822  LIPASE 74*   Recent Labs  Lab 10/02/18 2201  AMMONIA 42*   Coagulation Profile: No results for input(s): INR, PROTIME in the last 168 hours. Cardiac Enzymes: No results for input(s): CKTOTAL, CKMB, CKMBINDEX, TROPONINI in the last 168 hours. BNP (last 3 results) No results for input(s): PROBNP in the last 8760 hours. HbA1C: No results for input(s): HGBA1C in the last 72 hours. CBG: Recent Labs  Lab 10/02/18 1719 10/03/18 0155  GLUCAP 278* 104*   Lipid Profile: No results for input(s): CHOL, HDL, LDLCALC, TRIG, CHOLHDL, LDLDIRECT in the last 72 hours. Thyroid Function Tests: No results for input(s): TSH, T4TOTAL, FREET4, T3FREE, THYROIDAB in the last 72 hours. Anemia Panel: No results for input(s): VITAMINB12, FOLATE, FERRITIN, TIBC, IRON, RETICCTPCT in the last 72 hours. Urine analysis:    Component Value Date/Time   COLORURINE AMBER (A) 10/02/2018 2117   APPEARANCEUR HAZY (A) 10/02/2018 2117   LABSPEC 1.022 10/02/2018 2117   PHURINE  5.0 10/02/2018 2117   GLUCOSEU 150 (A) 10/02/2018 2117   HGBUR MODERATE (A) 10/02/2018 2117   BILIRUBINUR NEGATIVE 10/02/2018 2117   KETONESUR NEGATIVE 10/02/2018 2117   PROTEINUR >=300 (A) 10/02/2018 2117   NITRITE NEGATIVE 10/02/2018 2117   LEUKOCYTESUR NEGATIVE 10/02/2018 2117   Sepsis Labs: _0 (procalcitonin:4,lacticidven:4)  ) Recent Results (from the past 240 hour(s))  SARS Coronavirus 2 [facility] order, Performed in [facility] hospital lab) Nasopharyngeal Nasopharyngeal Swab     Status: None   Collection Time: 10/02/18  9:48 PM   Specimen: Nasopharyngeal Swab  Result Value Ref Range Status   SARS Coronavirus 2 NEGATIVE NEGATIVE Final    Comment: (NOTE) If result is NEGATIVE SARS-CoV-2 target nucleic acids are  NOT DETECTED. The SARS-CoV-2 RNA is generally detectable in upper and lower  respiratory specimens during the acute phase of infection. The lowest  concentration of SARS-CoV-2 viral copies this assay can detect is 250  copies / mL. A negative result does not preclude SARS-CoV-2 infection  and should not be used as the sole basis for treatment or other  patient management decisions.  A negative result may occur with  improper specimen collection / handling, submission of specimen other  than nasopharyngeal swab, presence of viral mutation(s) within the  areas targeted by this assay, and inadequate number of viral copies  (<250 copies / mL). A negative result must be combined with clinical  observations, patient history, and epidemiological information. If result is POSITIVE SARS-CoV-2 target nucleic acids are DETECTED. The SARS-CoV-2 RNA is generally detectable in upper and lower  respiratory specimens dur ing the acute phase of infection.  Positive  results are indicative of active infection with SARS-CoV-2.  Clinical  correlation with patient history and other diagnostic information is  necessary to determine patient infection status.  Positive results do  not rule out bacterial infection or co-infection with other viruses. If result is PRESUMPTIVE POSTIVE SARS-CoV-2 nucleic acids MAY BE PRESENT.   A presumptive positive result was obtained on the submitted specimen  and confirmed on repeat testing.  While 2019 novel coronavirus  (SARS-CoV-2) nucleic acids may be present in the submitted sample  additional confirmatory testing may be necessary for epidemiological  and / or clinical management purposes  to differentiate between  SARS-CoV-2 and other Sarbecovirus currently known to infect humans.  If clinically indicated additional testing with an alternate test  methodology [phone]) is advised. The SARS-CoV-2 RNA is generally  detectable in upper and lower respiratory sp ecimens  during the acute  phase of infection. The expected result is Negative. Fact Sheet for Patients:  StrictlyIdeas.no Fact Sheet for Healthcare Providers: BankingDealers.co.za This test is not yet approved or cleared by the [place] FDA and has been authorized for detection and/or diagnosis of SARS-CoV-2 by FDA under an Emergency Use Authorization (EUA).  This EUA will remain in effect (meaning this test can be used) for the duration of the COVID-19 declaration under Section 564(b)(1) of the Act, 21 U.S.C. section 360bbb-3(b)(1), unless the authorization is terminated or revoked sooner. Performed at [facility], [place] [address] [place], [place] [address]       Studies: Ct Head Wo Contrast  Result Date: 10/02/2018 CLINICAL DATA:  Altered level of consciousness EXAM: CT HEAD WITHOUT CONTRAST TECHNIQUE: Contiguous axial images were obtained from the base of the skull through the vertex without intravenous contrast. COMPARISON:  None. FINDINGS: Brain: No evidence of acute territorial infarction, hemorrhage, hydrocephalus,extra-axial collection or mass lesion/mass effect. Normal gray-white  differentiation. Ventricles are normal in size and contour. Vascular: No hyperdense vessel or unexpected calcification. Skull: The skull is intact. No fracture or focal lesion identified. Sinuses/Orbits: The visualized paranasal sinuses and mastoid air cells are clear. The orbits and globes intact. Other: None IMPRESSION: No acute intracranial pathology. Electronically Signed   By: Prudencio Pair M.D.   On: 10/02/2018 19:55   Ct Abdomen Pelvis W Contrast  Result Date: 10/02/2018 CLINICAL DATA:  Abdominal distention, nausea, vomiting. Question gallbladder disease. EXAM: CT ABDOMEN AND PELVIS WITH CONTRAST TECHNIQUE: Multidetector CT imaging of the abdomen and pelvis was performed using the standard protocol following bolus administration of intravenous contrast.  CONTRAST:  124m OMNIPAQUE IOHEXOL 300 MG/ML  SOLN COMPARISON:  None. FINDINGS: Lower chest: Cardiomegaly.  No confluent opacities or effusions. Hepatobiliary: Multiple gallstones layering within the gallbladder. No focal hepatic abnormality or biliary ductal dilatation. No visible gallbladder wall thickening. Pancreas: No focal abnormality or ductal dilatation. Spleen: No focal abnormality.  Normal size. Adrenals/Urinary Tract: No adrenal abnormality. No focal renal abnormality. No stones or hydronephrosis. Urinary bladder is unremarkable. Stomach/Bowel: Colonic diverticulosis. No active diverticulitis. Stomach and small bowel decompressed, unremarkable. Vascular/Lymphatic: No evidence of aneurysm or adenopathy. Aortic atherosclerosis. Reproductive: No visible focal abnormality. Other: No free fluid or free air. Musculoskeletal: No acute bony abnormality. IMPRESSION: Colonic diverticulosis crash that cholelithiasis. No CT evidence for cholecystitis. Colonic diverticulosis.  No diverticulitis. Aortic atherosclerosis. No acute findings in the abdomen or pelvis. Electronically Signed   By: KRolm BaptiseM.D.   On: 10/02/2018 19:57   Dg Abdomen Acute W/chest  Result Date: 10/02/2018 CLINICAL DATA: Nausea and vomiting EXAM: DG ABDOMEN ACUTE W/ 1V CHEST COMPARISON:  Chest x-ray 07/06/2016 FINDINGS: Single-view chest demonstrates no focal airspace disease or effusion. Cardiomediastinal silhouette within normal limits. No pneumothorax. No free air beneath the diaphragm. Borderline to mild gaseous distention of central small bowel loops with scattered colon gas present. IMPRESSION: 1. No radiographic evidence for acute cardiopulmonary abnormality. 2. Borderline to mild gaseous dilatation of central small bowel though with scattered colon gas present, findings could be secondary to enteritis or ileus, however partial or developing bowel obstruction could also produce this appearance. Electronically Signed   By: KDonavan FoilM.D.   On: 10/02/2018 18:58   [place]Abdomen Limited Ruq  Result Date: 10/02/2018 CLINICAL DATA:  Elevated LFTs EXAM: ULTRASOUND ABDOMEN LIMITED RIGHT UPPER QUADRANT COMPARISON:  CT 10/02/2018 FINDINGS: Gallbladder: Sludge in multiple small stones in the gallbladder measuring up to 1.4 cm. Increased wall thickness at 5.7 mm. Negative sonographic Murphy. Common bile duct: Diameter: 2.9 mm Liver: Increased hepatic echogenicity without focal hepatic abnormality. Portal vein is patent on color Doppler imaging with normal direction of blood flow towards the liver. Other: None. IMPRESSION: 1. Sludge and stones within the gallbladder with increased wall thickness but negative sonographic Murphy. Findings are nonspecific and could be secondary to acute or chronic cholecystitis, liver disease, or edema forming states. If acute gallbladder disease remains a concern, further evaluation with hepatobiliary nuclear medicine imaging could be obtained. 2. Slightly echogenic liver suggesting steatosis Electronically Signed   By: KDonavan FoilM.D.   On: 10/02/2018 21:35    Scheduled Meds:  [START ON 10/04/2018] Chlorhexidine Gluconate Cloth  6 each Topical Q0600   doxercalciferol  10 mcg Intravenous Q M,W,F-HD   [START ON 10/04/2018] influenza vac split quadrivalent PF  0.5 mL Intramuscular Tomorrow-1000    Continuous Infusions:   LOS: 1 day     CKayleen Memos  MD [facility] Pager [phone]  If 7PM-7AM, please contact night-coverage [email or web address] Password [email or web address] 10/03/2018, 2:13 PM

## 2018-10-03 NOTE — Plan of Care (Signed)

## 2018-10-03 NOTE — Progress Notes (Signed)
Pt transported to dialysis via pt transport.

## 2018-10-03 NOTE — ED Notes (Addendum)
ED TO INPATIENT HANDOFF REPORT  ED Nurse Name and Phone #: Rakesh [phone]  S Name/Age/Gender Edgar Brewer 58 y.o. male Room/Bed: 038C/038C  Code Status   Code Status: Full Code  Home/SNF/Other Home Patient oriented to: self, place, time and situation Is this baseline? Yes   Triage Complete: Triage complete  Chief Complaint hyperglycemia  Triage Note Came in via ems; c/o generalized weakness the last few days; reported recent changes to DM meds; pt is on MWF HD; had a full session yesterday.    Allergies Allergies  Allergen Reactions  . Codeine Hives, Swelling and Other (See Comments)    Swelling all over body     Level of Care/Admitting Diagnosis ED Disposition    ED Disposition Condition [place]: [place] [100100]  Level of Care: Med-Surg [16]  Covid Evaluation: Asymptomatic Screening Protocol (No Symptoms)  Diagnosis: Elevated LFTs [321910]  Admitting Physician: Shela Leff [[id number]]  Attending Physician: Shela Leff [[id number]]  Estimated length of stay: past midnight tomorrow  Certification:: I certify this patient will need inpatient services for at least 2 midnights  PT Class (Do Not Modify): Inpatient [101]  PT Acc Code (Do Not Modify): Private [1]       B Medical/Surgery History Past Medical History:  Diagnosis Date  . Acute on chronic combined systolic and diastolic CHF, NYHA class 1 ([place]) 01/13/2015  . Arthritis   . Chronic kidney disease    MWF [name].  . Diabetes mellitus without complication ([place])    Type II  . GERD (gastroesophageal reflux disease)    at times  . Head injury with loss of consciousness ([place])   . History of kidney stones   . Hypertension   . Neuropathy   . Pulmonary hypertension (HCC)    PA peak pressure 41 mmHg 03/04/16 echo  . Varicose veins   . Varicose veins of lower extremities with complications 78/93/8101   Past Surgical History:  Procedure Laterality Date   . AV FISTULA PLACEMENT Left 06/01/2016   Procedure: ARTERIOVENOUS (AV) FISTULA CREATION;  Surgeon: [name], MD;  Location: [facility] OR;  Service: Vascular;  Laterality: Left;  . AV FISTULA PLACEMENT Left 08/10/2017   Procedure: BRACHIOCEPHALIC ARTERIOVENOUS (AV) FISTULA CREATION LEFT UPPER EXTREMITY;  Surgeon: [name], MD;  Location[facility];  Service: Vascular;  Laterality: Left;  . COLONOSCOPY W/ POLYPECTOMY    . ENDOVENOUS ABLATION SAPHENOUS VEIN W/ LASER Left 07-23-2015   endovenous laser ablation left greater saphenous vein by [name]  . ENDOVENOUS ABLATION SAPHENOUS VEIN W/ LASER Right 10/15/2015   EVLA right greater saphenous vein by [name]     A IV Location/Drains/Wounds Patient Lines/Drains/Airways Status   Active Line/Drains/Airways    Name:   Placement date:   Placement time:   Site:   Days:   Peripheral IV 10/02/18 Right Arm   10/02/18    1730    Arm   1   Peripheral IV 10/02/18 Right Hand   10/02/18    1746    Hand   1   Fistula / Graft Left Forearm Arteriovenous fistula   06/01/16    0913    Forearm   854   Fistula / Graft Left Upper arm Arteriovenous fistula   08/10/17    0853    Upper arm   419   Incision (Closed) 06/01/16 Arm Left   06/01/16    0911     854  Incision (Closed) 08/10/17 Arm Left   08/10/17    0802     419          Intake/Output Last 24 hours  Intake/Output Summary (Last 24 hours) at 10/03/2018 0146 Last data filed at 10/02/2018 1745 Gross per 24 hour  Intake 0 ml  Output 0 ml  Net 0 ml    Labs/Imaging Results for orders placed or performed during the hospital encounter of 10/02/18 (from the past 48 hour(s))  CBG monitoring, ED     Status: Abnormal   Collection Time: 10/02/18  5:19 PM  Result Value Ref Range   Glucose-Capillary 278 (H) 70 - 99 mg/dL   Comment 1 Notify RN    Comment 2 Document in Chart   CBC with Differential     Status: Abnormal   Collection Time: 10/02/18  5:26 PM  Result Value Ref Range   WBC 8.7  4.0 - 10.5 K/uL   RBC 3.85 (L) 4.22 - 5.81 MIL/uL   Hemoglobin 11.4 (L) 13.0 - 17.0 g/dL   HCT 34.8 (L) 39.0 - 52.0 %   MCV 90.4 80.0 - 100.0 fL   MCH 29.6 26.0 - 34.0 pg   MCHC 32.8 30.0 - 36.0 g/dL   RDW 13.9 11.5 - 15.5 %   Platelets 192 150 - 400 K/uL   nRBC 0.0 0.0 - 0.2 %   Neutrophils Relative % 87 %   Neutro Abs 7.7 1.7 - 7.7 K/uL   Lymphocytes Relative 4 %   Lymphs Abs 0.4 (L) 0.7 - 4.0 K/uL   Monocytes Relative 7 %   Monocytes Absolute 0.6 0.1 - 1.0 K/uL   Eosinophils Relative 0 %   Eosinophils Absolute 0.0 0.0 - 0.5 K/uL   Basophils Relative 1 %   Basophils Absolute 0.0 0.0 - 0.1 K/uL   Immature Granulocytes 1 %   Abs Immature Granulocytes 0.06 0.00 - 0.07 K/uL    Comment: Performed at [facility], [address], [place], [place] [address]  Comprehensive metabolic panel     Status: Abnormal   Collection Time: 10/02/18  5:26 PM  Result Value Ref Range   Sodium 134 (L) 135 - 145 mmol/L   Potassium 5.1 3.5 - 5.1 mmol/L   Chloride 95 (L) 98 - 111 mmol/L   CO2 23 22 - 32 mmol/L   Glucose, Bld 286 (H) 70 - 99 mg/dL   BUN 45 (H) 6 - 20 mg/dL   Creatinine, Ser 7.91 (H) 0.61 - 1.24 mg/dL   Calcium 8.3 (L) 8.9 - 10.3 mg/dL   Total Protein 7.7 6.5 - 8.1 g/dL   Albumin 3.6 3.5 - 5.0 g/dL   AST 308 (H) 15 - 41 U/L   ALT 306 (H) 0 - 44 U/L   Alkaline Phosphatase 762 (H) 38 - 126 U/L   Total Bilirubin 3.6 (H) 0.3 - 1.2 mg/dL   GFR calc non Af Amer 7 (L) >60 mL/min   GFR calc Af Amer 8 (L) >60 mL/min   Anion gap 16 (H) 5 - 15    Comment: Performed at [facility], [place] [address], [place], [place] [address]  Lipase, blood     Status: Abnormal   Collection Time: 10/02/18  6:22 PM  Result Value Ref Range   Lipase 74 (H) 11 - 51 U/L    Comment: Performed at [facility] [address], [place], [place] [address]  Urinalysis, Routine w reflex microscopic  Status: Abnormal   Collection Time: 10/02/18  9:17 PM  Result Value Ref Range   Color, Urine  AMBER (A) YELLOW    Comment: BIOCHEMICALS MAY BE AFFECTED BY COLOR   APPearance HAZY (A) CLEAR   Specific Gravity, Urine 1.022 1.005 - 1.030   pH 5.0 5.0 - 8.0   Glucose, UA 150 (A) NEGATIVE mg/dL   Hgb urine dipstick MODERATE (A) NEGATIVE   Bilirubin Urine NEGATIVE NEGATIVE   Ketones, ur NEGATIVE NEGATIVE mg/dL   Protein, ur >=300 (A) NEGATIVE mg/dL   Nitrite NEGATIVE NEGATIVE   Leukocytes,Ua NEGATIVE NEGATIVE   RBC / HPF 6-10 0 - 5 RBC/hpf   WBC, UA 6-10 0 - 5 WBC/hpf   Bacteria, UA RARE (A) NONE SEEN   Squamous Epithelial / LPF 0-5 0 - 5   Mucus PRESENT    Hyaline Casts, UA PRESENT     Comment: Performed at [facility], [address], [place], [place] [address]  SARS Coronavirus 2 [facility] order, Performed in [facility] hospital lab) Nasopharyngeal Nasopharyngeal Swab     Status: None   Collection Time: 10/02/18  9:48 PM   Specimen: Nasopharyngeal Swab  Result Value Ref Range   SARS Coronavirus 2 NEGATIVE NEGATIVE    Comment: (NOTE) If result is NEGATIVE SARS-CoV-2 target nucleic acids are NOT DETECTED. The SARS-CoV-2 RNA is generally detectable in upper and lower  respiratory specimens during the acute phase of infection. The lowest  concentration of SARS-CoV-2 viral copies this assay can detect is 250  copies / mL. A negative result does not preclude SARS-CoV-2 infection  and should not be used as the sole basis for treatment or other  patient management decisions.  A negative result may occur with  improper specimen collection / handling, submission of specimen other  than nasopharyngeal swab, presence of viral mutation(s) within the  areas targeted by this assay, and inadequate number of viral copies  (<250 copies / mL). A negative result must be combined with clinical  observations, patient history, and epidemiological information. If result is POSITIVE SARS-CoV-2 target nucleic acids are DETECTED. The SARS-CoV-2 RNA is generally detectable in upper and lower   respiratory specimens dur ing the acute phase of infection.  Positive  results are indicative of active infection with SARS-CoV-2.  Clinical  correlation with patient history and other diagnostic information is  necessary to determine patient infection status.  Positive results do  not rule out bacterial infection or co-infection with other viruses. If result is PRESUMPTIVE POSTIVE SARS-CoV-2 nucleic acids MAY BE PRESENT.   A presumptive positive result was obtained on the submitted specimen  and confirmed on repeat testing.  While 2019 novel coronavirus  (SARS-CoV-2) nucleic acids may be present in the submitted sample  additional confirmatory testing may be necessary for epidemiological  and / or clinical management purposes  to differentiate between  SARS-CoV-2 and other Sarbecovirus currently known to infect humans.  If clinically indicated additional testing with an alternate test  methodology [phone]) is advised. The SARS-CoV-2 RNA is generally  detectable in upper and lower respiratory sp ecimens during the acute  phase of infection. The expected result is Negative. Fact Sheet for Patients:  StrictlyIdeas.no Fact Sheet for Healthcare Providers: BankingDealers.co.za This test is not yet approved or cleared by the [place] FDA and has been authorized for detection and/or diagnosis of SARS-CoV-2 by FDA under an Emergency Use Authorization (EUA).  This EUA will remain in effect (meaning this test can be used)  for the duration of the COVID-19 declaration under Section 564(b)(1) of the Act, 21 U.S.C. section 360bbb-3(b)(1), unless the authorization is terminated or revoked sooner. Performed at [facility], [place] [address] [place], [place] [address]   Ammonia     Status: Abnormal   Collection Time: 10/02/18 10:01 PM  Result Value Ref Range   Ammonia 42 (H) 9 - 35 umol/L    Comment: Performed at [facility],  [place] [address] [place], [place] [address]   Ct Head Wo Contrast  Result Date: 10/02/2018 CLINICAL DATA:  Altered level of consciousness EXAM: CT HEAD WITHOUT CONTRAST TECHNIQUE: Contiguous axial images were obtained from the base of the skull through the vertex without intravenous contrast. COMPARISON:  None. FINDINGS: Brain: No evidence of acute territorial infarction, hemorrhage, hydrocephalus,extra-axial collection or mass lesion/mass effect. Normal gray-white differentiation. Ventricles are normal in size and contour. Vascular: No hyperdense vessel or unexpected calcification. Skull: The skull is intact. No fracture or focal lesion identified. Sinuses/Orbits: The visualized paranasal sinuses and mastoid air cells are clear. The orbits and globes intact. Other: None IMPRESSION: No acute intracranial pathology. Electronically Signed   By: Prudencio Pair M.D.   On: 10/02/2018 19:55   Ct Abdomen Pelvis W Contrast  Result Date: 10/02/2018 CLINICAL DATA:  Abdominal distention, nausea, vomiting. Question gallbladder disease. EXAM: CT ABDOMEN AND PELVIS WITH CONTRAST TECHNIQUE: Multidetector CT imaging of the abdomen and pelvis was performed using the standard protocol following bolus administration of intravenous contrast. CONTRAST:  148mL OMNIPAQUE IOHEXOL 300 MG/ML  SOLN COMPARISON:  None. FINDINGS: Lower chest: Cardiomegaly.  No confluent opacities or effusions. Hepatobiliary: Multiple gallstones layering within the gallbladder. No focal hepatic abnormality or biliary ductal dilatation. No visible gallbladder wall thickening. Pancreas: No focal abnormality or ductal dilatation. Spleen: No focal abnormality.  Normal size. Adrenals/Urinary Tract: No adrenal abnormality. No focal renal abnormality. No stones or hydronephrosis. Urinary bladder is unremarkable. Stomach/Bowel: Colonic diverticulosis. No active diverticulitis. Stomach and small bowel decompressed, unremarkable. Vascular/Lymphatic: No evidence of aneurysm  or adenopathy. Aortic atherosclerosis. Reproductive: No visible focal abnormality. Other: No free fluid or free air. Musculoskeletal: No acute bony abnormality. IMPRESSION: Colonic diverticulosis crash that cholelithiasis. No CT evidence for cholecystitis. Colonic diverticulosis.  No diverticulitis. Aortic atherosclerosis. No acute findings in the abdomen or pelvis. Electronically Signed   By: Rolm Baptise M.D.   On: 10/02/2018 19:57   Dg Abdomen Acute W/chest  Result Date: 10/02/2018 CLINICAL DATA: Nausea and vomiting EXAM: DG ABDOMEN ACUTE W/ 1V CHEST COMPARISON:  Chest x-ray 07/06/2016 FINDINGS: Single-view chest demonstrates no focal airspace disease or effusion. Cardiomediastinal silhouette within normal limits. No pneumothorax. No free air beneath the diaphragm. Borderline to mild gaseous distention of central small bowel loops with scattered colon gas present. IMPRESSION: 1. No radiographic evidence for acute cardiopulmonary abnormality. 2. Borderline to mild gaseous dilatation of central small bowel though with scattered colon gas present, findings could be secondary to enteritis or ileus, however partial or developing bowel obstruction could also produce this appearance. Electronically Signed   By: Donavan Foil M.D.   On: 10/02/2018 18:58   US Abdomen Limited Ruq  Result Date: 10/02/2018 CLINICAL DATA:  Elevated LFTs EXAM: ULTRASOUND ABDOMEN LIMITED RIGHT UPPER QUADRANT COMPARISON:  CT 10/02/2018 FINDINGS: Gallbladder: Sludge in multiple small stones in the gallbladder measuring up to 1.4 cm. Increased wall thickness at 5.7 mm. Negative sonographic Murphy. Common bile duct: Diameter: 2.9 mm Liver: Increased hepatic echogenicity without focal hepatic abnormality. Portal vein is  patent on color Doppler imaging with normal direction of blood flow towards the liver. Other: None. IMPRESSION: 1. Sludge and stones within the gallbladder with increased wall thickness but negative sonographic Murphy. Findings  are nonspecific and could be secondary to acute or chronic cholecystitis, liver disease, or edema forming states. If acute gallbladder disease remains a concern, further evaluation with hepatobiliary nuclear medicine imaging could be obtained. 2. Slightly echogenic liver suggesting steatosis Electronically Signed   By: Donavan Foil M.D.   On: 10/02/2018 21:35    Pending Labs Unresulted Labs (From admission, onward)    Start     Ordered   10/03/18 0500  Comprehensive metabolic panel  Tomorrow morning,   R     10/03/18 0026          Vitals/Pain Today's Vitals   10/02/18 1757 10/02/18 2200 10/02/18 2215 10/02/18 2230  BP:  121/65 138/69 132/69  Pulse:  78  80  Resp:  (!) 24 (!) 23 (!) 41  Temp:      SpO2:  99%  96%  Weight:      Height:      PainSc: 0-No pain       Isolation Precautions No active isolations  Medications Medications  acetaminophen (TYLENOL) tablet 650 mg (has no administration in time range)    Or  acetaminophen (TYLENOL) suppository 650 mg (has no administration in time range)  ondansetron (ZOFRAN) injection 4 mg (has no administration in time range)  iohexol (OMNIPAQUE) 300 MG/ML solution 100 mL (100 mLs Intravenous Contrast Given 10/02/18 1936)    Mobility walks with person assist Low fall risk   Focused Assessments Neuro Assessment Handoff:  Swallow screen pass? deferred Cardiac Rhythm: Normal sinus rhythm       Neuro Assessment: Exceptions to WDL Neuro Checks:      Last Documented NIHSS Modified Score:   Has TPA been given? No If patient is a Neuro Trauma and patient is going to OR before floor call report to [place] nurse: [phone] or [phone]     R Recommendations: See Admitting Provider Note  Report given to:   Additional Notes:

## 2018-10-03 NOTE — Progress Notes (Signed)
Pt returned via pt transport to room from dialysis.  1L removed today.  Vital signs stable.  Awaiting MRCP.

## 2018-10-03 NOTE — Progress Notes (Signed)
Called the patient's mother and gave her updates via phone as requested by the patient.  All questions answered to her satisfaction.

## 2018-10-03 NOTE — Consult Note (Signed)
[facility] Renal Consultation Note    Indication for Consultation:  Management of ESRD/hemodialysis; anemia, hypertension/volume and secondary hyperparathyroidism PCP:  HPI: Edgar Brewer is a 58 y.o. male with ESRD secondary to DM on HD since January 2019 on MWF HD at [place] with hx of HTN,, combined CHF, ?  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  He lives with his mother. He denies any problems with dialysis except that it is very cold in his dialysis unit.  He reports the onset of abdominal symptoms about 2 days ago. He has some nausea and diarrhea today. Overall, seems to minimize symptoms. Denies significant abdominal pain. No SOB. Average net UF during treatments is 3-4 kg +/- EDW. BP controlled.  Admitted yesterday with vomiting, abdominal pain and AMS. Evaluation in the ED showed normal WBC hgb stable BS elevated some what alk phos 762 T bili 3.6 and ^ LFT 300s - down to 200s today,  lipase 74 NH3 42  COVID neg,  Abd CT showed cholelithiasis but no cholecystitis, colonic diverticulosis,  No ductal dilatation, echogenic liver suggests steatosis. Head CT showed no acute change. Scheduled for MRCP today. Last dialysis was Monday.  He ran his full treatment, was afebrile. Net UF 2.8 post wt 91.5 and BP stable throughout.   Past Medical History:  Diagnosis Date  . Acute on chronic combined systolic and diastolic CHF, NYHA class 1 ([place]) 01/13/2015  . Arthritis   . Chronic kidney disease    MWF [name].  . Diabetes mellitus without complication ([place])    Type II  . GERD (gastroesophageal reflux disease)    at times  . Head injury with loss of consciousness ([place])   . History of kidney stones   . Hypertension   . Neuropathy   . Pulmonary hypertension (HCC)    PA peak pressure 41 mmHg 03/04/16 echo  . Varicose veins   . Varicose veins of lower  extremities with complications 57/32/2025   Past Surgical History:  Procedure Laterality Date  . AV FISTULA PLACEMENT Left 06/01/2016   Procedure: ARTERIOVENOUS (AV) FISTULA CREATION;  Surgeon: [name], MD;  Location: [facility] OR;  Service: Vascular;  Laterality: Left;  . AV FISTULA PLACEMENT Left 08/10/2017   Procedure: BRACHIOCEPHALIC ARTERIOVENOUS (AV) FISTULA CREATION LEFT UPPER EXTREMITY;  Surgeon: [name], MD;  Location: [facility] OR;  Service: Vascular;  Laterality: Left;  . COLONOSCOPY W/ POLYPECTOMY    . ENDOVENOUS ABLATION SAPHENOUS VEIN W/ LASER Left 07-23-2015   endovenous laser ablation left greater saphenous vein by [name]  . ENDOVENOUS ABLATION SAPHENOUS VEIN W/ LASER Right 10/15/2015   EVLA right greater saphenous vein by [name]   Family History  Problem Relation Age of Onset  . Cancer Father    Social History:  reports that he quit smoking about 3 years ago. His smoking use included cigarettes. He has a 45.00 pack-year smoking history. He has never used smokeless tobacco. He reports current alcohol use of about 1.0 standard drinks of alcohol per week. He reports that he does not use drugs. Allergies  Allergen Reactions  . Codeine Hives, Swelling and Other (See Comments)    Swelling all over body    Prior to Admission medications   Medication Sig Start Date End Date Taking? Authorizing Provider  atorvastatin (LIPITOR) 20 MG tablet Take 20 mg by  mouth daily.  11/02/17  Yes [provider]  b complex-vitamin c-folic acid (NEPHRO-VITE) 0.8 MG TABS tablet Take 1 tablet by mouth daily.   Yes [provider]  cinacalcet (SENSIPAR) 30 MG tablet Take 30 mg by mouth daily.  07/31/18  Yes [provider]  Docusate Sodium (STOOL SOFTENER LAXATIVE PO) Take 1 tablet by mouth daily.    Yes [provider]  ferric citrate (AURYXIA) 1 GM 210 MG(Fe) tablet Take 420 mg by mouth 3 (three) times daily with meals.   Yes [provider]  gabapentin (NEURONTIN) 300 MG capsule Take 300 mg by mouth 2 (two) times daily.   Yes [provider]  glipiZIDE (GLUCOTROL) 5 MG tablet Take 10 mg by mouth daily.    Yes [provider]  linagliptin (TRADJENTA) 5 MG TABS tablet Take 5 mg by mouth daily.   Yes [provider]  Omega-3 Fatty Acids (FISH OIL) 1000 MG CPDR Take by mouth.   Yes [provider]  omeprazole (PRILOSEC) 20 MG capsule Take 20 mg by mouth daily. 11/25/17  Yes [provider]  sevelamer carbonate (RENVELA) 800 MG tablet Take 1,600 mg by mouth 3 (three) times daily with meals.   Yes [provider]   Current Facility-Administered Medications  Medication Dose Route Frequency Provider Last Rate Last Dose  . hydrALAZINE (APRESOLINE) injection 5 mg  5 mg Intravenous Q4H PRN Shela Leff, MD      . Derrill Memo ON 10/04/2018] influenza vac split quadrivalent PF (FLUARIX) injection 0.5 mL  0.5 mL Intramuscular Tomorrow-1000 Hall, Carole N, DO      . ondansetron (ZOFRAN) injection 4 mg  4 mg Intravenous Q6H PRN Shela Leff, MD       Labs: Basic Metabolic Panel: Recent Labs  Lab 10/02/18 1726 10/03/18 0530  NA 134* 136  K 5.1 4.4  CL 95* 95*  CO2 23 26  GLUCOSE 286* 109*  BUN 45* 51*  CREATININE 7.91* 8.71*  CALCIUM 8.3* 8.5*   Liver Function Tests: Recent Labs  Lab 10/02/18 1726 10/03/18 0530  AST 308* 216*  ALT 306* 265*  ALKPHOS 762* 736*  BILITOT 3.6* 3.7*  PROT 7.7 7.1  ALBUMIN 3.6 3.2*   Recent Labs  Lab 10/02/18 1822  LIPASE 74*   Recent Labs  Lab 10/02/18 2201  AMMONIA 42*   CBC: Recent Labs  Lab 10/02/18 1726  WBC 8.7  NEUTROABS 7.7  HGB 11.4*  HCT 34.8*  MCV 90.4  PLT 192   Cardiac Enzymes: No results for input(s): CKTOTAL, CKMB, CKMBINDEX, TROPONINI in the last 168 hours. CBG: Recent Labs  Lab 10/02/18 1719 10/03/18 0155  GLUCAP 278* 104*   Iron Studies: No results for input(s): IRON, TIBC,  TRANSFERRIN, FERRITIN in the last 72 hours. Studies/Results: Ct Head Wo Contrast  Result Date: 10/02/2018 CLINICAL DATA:  Altered level of consciousness EXAM: CT HEAD WITHOUT CONTRAST TECHNIQUE: Contiguous axial images were obtained from the base of the skull through the vertex without intravenous contrast. COMPARISON:  None. FINDINGS: Brain: No evidence of acute territorial infarction, hemorrhage, hydrocephalus,extra-axial collection or mass lesion/mass effect. Normal gray-white differentiation. Ventricles are normal in size and contour. Vascular: No hyperdense vessel or unexpected calcification. Skull: The skull is intact. No fracture or focal lesion identified. Sinuses/Orbits: The visualized paranasal sinuses and mastoid air cells are clear. The orbits and globes intact. Other: None IMPRESSION: No acute intracranial pathology. Electronically Signed   By: Prudencio Pair M.D.   On: 10/02/2018 19:55  Ct Abdomen Pelvis W Contrast  Result Date: 10/02/2018 CLINICAL DATA:  Abdominal distention, nausea, vomiting. Question gallbladder disease. EXAM: CT ABDOMEN AND PELVIS WITH CONTRAST TECHNIQUE: Multidetector CT imaging of the abdomen and pelvis was performed using the standard protocol following bolus administration of intravenous contrast. CONTRAST:  159m OMNIPAQUE IOHEXOL 300 MG/ML  SOLN COMPARISON:  None. FINDINGS: Lower chest: Cardiomegaly.  No confluent opacities or effusions. Hepatobiliary: Multiple gallstones layering within the gallbladder. No focal hepatic abnormality or biliary ductal dilatation. No visible gallbladder wall thickening. Pancreas: No focal abnormality or ductal dilatation. Spleen: No focal abnormality.  Normal size. Adrenals/Urinary Tract: No adrenal abnormality. No focal renal abnormality. No stones or hydronephrosis. Urinary bladder is unremarkable. Stomach/Bowel: Colonic diverticulosis. No active diverticulitis. Stomach and small bowel decompressed, unremarkable. Vascular/Lymphatic: No  evidence of aneurysm or adenopathy. Aortic atherosclerosis. Reproductive: No visible focal abnormality. Other: No free fluid or free air. Musculoskeletal: No acute bony abnormality. IMPRESSION: Colonic diverticulosis crash that cholelithiasis. No CT evidence for cholecystitis. Colonic diverticulosis.  No diverticulitis. Aortic atherosclerosis. No acute findings in the abdomen or pelvis. Electronically Signed   By: KRolm BaptiseM.D.   On: 10/02/2018 19:57   Dg Abdomen Acute W/chest  Result Date: 10/02/2018 CLINICAL DATA: Nausea and vomiting EXAM: DG ABDOMEN ACUTE W/ 1V CHEST COMPARISON:  Chest x-ray 07/06/2016 FINDINGS: Single-view chest demonstrates no focal airspace disease or effusion. Cardiomediastinal silhouette within normal limits. No pneumothorax. No free air beneath the diaphragm. Borderline to mild gaseous distention of central small bowel loops with scattered colon gas present. IMPRESSION: 1. No radiographic evidence for acute cardiopulmonary abnormality. 2. Borderline to mild gaseous dilatation of central small bowel though with scattered colon gas present, findings could be secondary to enteritis or ileus, however partial or developing bowel obstruction could also produce this appearance. Electronically Signed   By: KDonavan FoilM.D.   On: 10/02/2018 18:58   UKoreaAbdomen Limited Ruq  Result Date: 10/02/2018 CLINICAL DATA:  Elevated LFTs EXAM: ULTRASOUND ABDOMEN LIMITED RIGHT UPPER QUADRANT COMPARISON:  CT 10/02/2018 FINDINGS: Gallbladder: Sludge in multiple small stones in the gallbladder measuring up to 1.4 cm. Increased wall thickness at 5.7 mm. Negative sonographic Murphy. Common bile duct: Diameter: 2.9 mm Liver: Increased hepatic echogenicity without focal hepatic abnormality. Portal vein is patent on color Doppler imaging with normal direction of blood flow towards the liver. Other: None. IMPRESSION: 1. Sludge and stones within the gallbladder with increased wall thickness but negative  sonographic Murphy. Findings are nonspecific and could be secondary to acute or chronic cholecystitis, liver disease, or edema forming states. If acute gallbladder disease remains a concern, further evaluation with hepatobiliary nuclear medicine imaging could be obtained. 2. Slightly echogenic liver suggesting steatosis Electronically Signed   By: KDonavan FoilM.D.   On: 10/02/2018 21:35    ROS: As per HPI otherwise negative.  General: No weight loss, fever, chills  HEENT: No recent headaches, nasal bleeding,  visual changes, sore throat or dysphagia Neurologic: No dizziness, blackouts, seizures. No recent symptoms of stroke or mini- stroke. No recent episodes of slurred speech, or temporary blindness.  Cardiac: No recent episodes of chest pain/pressure,  shortness of breath at rest or DOE.  Vascular: No history of rest pain in feet, claudication, nonhealing ulcers  or DVT  Pulmonary: No home oxygen,no cough, hemoptysis, or wheezing  Musculoskeletal: no arthritis, low back pain, or joint pain  Hematologic:No history of hypercoagulable state. No history of easy bleeding. No history of anemia  Gastrointestinal: No hematochezia or melena,  No gastroesophageal reflux, no trouble swallowing  Urinary: Anuric or makes small amount of urine, no nurning with urination or frequency   Skin: No rashes or lesions Psychological: No  anxiety or depression   Physical Exam: Vitals:   10/03/18 0200 10/03/18 0230 10/03/18 0523 10/03/18 1155  BP: 132/70 (!) 160/72 121/61 (!) 129/56  Pulse:  81 79 80  Resp: (!) 23 17 16 18   Temp:  98.6 F (37 C) 98.4 F (36.9 C) 98.2 F (36.8 C)  TempSrc:  Oral Oral Oral  SpO2:  98% 97% 94%  Weight:  91.7 kg    Height:  5' 8"  (1.727 m)       General: WDWN NAD Head: NCAT sclera not icteric MMM Neck: Supple.  Lungs: CTA bilaterally without wheezes, rales, or rhonchi. Breathing is unlabored. Heart: RRR with S1 S2.  Abdomen: obese distended  NT + BS Lower  extremities:without edema Neuro: A & O  X 3. Moves all extremities spontaneously. Psych:  Responds to questions appropriately albeit a little grumpy. Dialysis Access: left upper AVF  Dialysis Orders:  NW 4.25 hours 400/800 EDW 92 2K 2 Ca left upper AVF heparin 2600 venofer 50 /week last 9/2 no ESA hectorol 10 Recent labs: hgb 11 38% sat iPTH 600 - only Mircera was 30 7/31  Assessment/Plan: 1.  Choledocolithiasis - plan for MRCP today -  2.  ESRD -  MWF - HD today - work around other procedures 3.  Hypertension/volume  - UF to EDW 4.  Anemia  - no ESA/Fe  indicated at this time 5.  Metabolic bone disease -  Continue home hectorol - started sensipar in July - resume sensipar/binders when taking pos  6.  Nutrition - NPO - advance as tolerated 7.  DM - per primary  Myriam Jacobson, PA-C [place] [phone] 10/03/2018, 12:39 PM

## 2018-10-03 NOTE — Progress Notes (Signed)
Inpatient Diabetes Program Recommendations  AACE/ADA: New Consensus Statement on Inpatient Glycemic Control (2015)  Target Ranges:  Prepandial:   less than 140 mg/dL      Peak postprandial:   less than 180 mg/dL (1-2 hours)      Critically ill patients:  140 - 180 mg/dL   Lab Results  Component Value Date   GLUCAP 104 (H) 10/03/2018   HGBA1C 6.9 (H) 08/10/2017    Review of Glycemic Control  Diabetes history: DM 2 Outpatient Diabetes medications: Glipizide 10 mg Daily, Tradjenta 5 mg Daily Current orders for Inpatient glycemic control: None  Inpatient Diabetes Program Recommendations:    Consider CBGs and Novolog 0-9 units Q4 hours while NPO.  Thanks,  Tama Headings RN, MSN, BC-ADM Inpatient Diabetes Coordinator Team Pager [phone] (8a-5p)

## 2018-10-04 ENCOUNTER — Inpatient Hospital Stay (HOSPITAL_COMMUNITY): Payer: Medicare Other | Admitting: Certified Registered Nurse Anesthetist

## 2018-10-04 ENCOUNTER — Encounter (HOSPITAL_COMMUNITY)
Admission: EM | Disposition: A | Discharge status: Home / Self Care | Payer: Self-pay | Source: Home / Self Care | Attending: Internal Medicine

## 2018-10-04 ENCOUNTER — Encounter (HOSPITAL_COMMUNITY): Payer: Self-pay

## 2018-10-04 HISTORY — PX: CHOLECYSTECTOMY: SHX55

## 2018-10-04 LAB — COMPREHENSIVE METABOLIC PANEL
ALT: 188 U/L — ABNORMAL HIGH (ref 0–44)
AST: 117 U/L — ABNORMAL HIGH (ref 15–41)
Albumin: 3.3 g/dL — ABNORMAL LOW (ref 3.5–5.0)
Alkaline Phosphatase: 693 U/L — ABNORMAL HIGH (ref 38–126)
Anion gap: 18 — ABNORMAL HIGH (ref 5–15)
BUN: 28 mg/dL — ABNORMAL HIGH (ref 6–20)
CO2: 22 mmol/L (ref 22–32)
Calcium: 8.6 mg/dL — ABNORMAL LOW (ref 8.9–10.3)
Chloride: 96 mmol/L — ABNORMAL LOW (ref 98–111)
Creatinine, Ser: 6.97 mg/dL — ABNORMAL HIGH (ref 0.61–1.24)
GFR calc Af Amer: 9 mL/min — ABNORMAL LOW (ref >60)
GFR calc non Af Amer: 8 mL/min — ABNORMAL LOW (ref >60)
Glucose, Bld: 96 mg/dL (ref 70–99)
Potassium: 4.5 mmol/L (ref 3.5–5.1)
Sodium: 136 mmol/L (ref 135–145)
Total Bilirubin: 3 mg/dL — ABNORMAL HIGH (ref 0.3–1.2)
Total Protein: 7 g/dL (ref 6.5–8.1)

## 2018-10-04 LAB — GLUCOSE, CAPILLARY
Glucose-Capillary: 97 mg/dL (ref 70–99)
Glucose-Capillary: 139 mg/dL — ABNORMAL HIGH (ref 70–99)

## 2018-10-04 LAB — CBC
HCT: 36.2 % — ABNORMAL LOW (ref 39.0–52.0)
Hemoglobin: 12 g/dL — ABNORMAL LOW (ref 13.0–17.0)
MCH: 30 pg (ref 26.0–34.0)
MCHC: 33.1 g/dL (ref 30.0–36.0)
MCV: 90.5 fL (ref 80.0–100.0)
Platelets: 202 10*3/uL (ref 150–400)
RBC: 4 MIL/uL — ABNORMAL LOW (ref 4.22–5.81)
RDW: 14 % (ref 11.5–15.5)
WBC: 4.7 10*3/uL (ref 4.0–10.5)
nRBC: 0 % (ref 0.0–0.2)

## 2018-10-04 SURGERY — LAPAROSCOPIC CHOLECYSTECTOMY
Anesthesia: General | Site: Abdomen

## 2018-10-04 MED ORDER — GLYCOPYRROLATE 0.2 MG/ML IJ SOLN
INTRAMUSCULAR | Status: DC | PRN
  Administered 2018-10-04: 0.6 mg via INTRAVENOUS

## 2018-10-04 MED ORDER — ONDANSETRON HCL 4 MG/2ML IJ SOLN
INTRAMUSCULAR | Status: DC | PRN
  Administered 2018-10-04: 4 mg via INTRAVENOUS

## 2018-10-04 MED ORDER — SODIUM CHLORIDE 0.9 % IR SOLN
Status: DC | PRN
  Administered 2018-10-04: 1000 mL

## 2018-10-04 MED ORDER — 0.9 % SODIUM CHLORIDE (POUR BTL) OPTIME
TOPICAL | Status: DC | PRN
  Administered 2018-10-04: 1000 mL

## 2018-10-04 MED ORDER — FENTANYL CITRATE (PF) 100 MCG/2ML IJ SOLN
INTRAMUSCULAR | Status: AC
  Administered 2018-10-04: 14:00:00 50 ug via INTRAVENOUS
  Filled 2018-10-04: qty 2

## 2018-10-04 MED ORDER — PROPOFOL 10 MG/ML IV BOLUS
INTRAVENOUS | Status: AC
  Filled 2018-10-04: qty 40

## 2018-10-04 MED ORDER — MIDAZOLAM HCL 2 MG/2ML IJ SOLN
INTRAMUSCULAR | Status: AC
  Filled 2018-10-04: qty 2

## 2018-10-04 MED ORDER — SODIUM CHLORIDE 0.9 % IV SOLN
2.0000 g | INTRAVENOUS | Status: AC
  Administered 2018-10-04: 2 g via INTRAVENOUS
  Filled 2018-10-04 (×2): qty 2

## 2018-10-04 MED ORDER — NEOSTIGMINE METHYLSULFATE 10 MG/10ML IV SOLN
INTRAVENOUS | Status: DC | PRN
  Administered 2018-10-04: 4 mg via INTRAVENOUS

## 2018-10-04 MED ORDER — SODIUM CHLORIDE 0.9 % IV SOLN
INTRAVENOUS | Status: DC | PRN
  Administered 2018-10-04: 11:00:00 via INTRAVENOUS

## 2018-10-04 MED ORDER — ROCURONIUM BROMIDE 10 MG/ML (PF) SYRINGE
PREFILLED_SYRINGE | INTRAVENOUS | Status: DC | PRN
  Administered 2018-10-04: 40 mg via INTRAVENOUS

## 2018-10-04 MED ORDER — MEPERIDINE HCL 25 MG/ML IJ SOLN: 6.2500 mg | INTRAMUSCULAR | Status: DC | PRN

## 2018-10-04 MED ORDER — FENTANYL CITRATE (PF) 100 MCG/2ML IJ SOLN
25.0000 ug | INTRAMUSCULAR | Status: DC | PRN
  Administered 2018-10-04 (×2): 25 ug via INTRAVENOUS
  Administered 2018-10-04: 14:00:00 50 ug via INTRAVENOUS

## 2018-10-04 MED ORDER — BUPIVACAINE-EPINEPHRINE (PF) 0.25% -1:200000 IJ SOLN
INTRAMUSCULAR | Status: AC
  Filled 2018-10-04: qty 30

## 2018-10-04 MED ORDER — ARTIFICIAL TEARS OPHTHALMIC OINT
TOPICAL_OINTMENT | OPHTHALMIC | Status: DC | PRN
  Administered 2018-10-04: 1 via OPHTHALMIC

## 2018-10-04 MED ORDER — SODIUM CHLORIDE 0.9 % IV SOLN
INTRAVENOUS | Status: DC | PRN
  Administered 2018-10-04: 25 ug/min via INTRAVENOUS

## 2018-10-04 MED ORDER — PROPOFOL 10 MG/ML IV BOLUS
INTRAVENOUS | Status: DC | PRN
  Administered 2018-10-04: 10 mg via INTRAVENOUS
  Administered 2018-10-04: 120 mg via INTRAVENOUS

## 2018-10-04 MED ORDER — OXYCODONE HCL 5 MG PO TABS
5.0000 mg | ORAL_TABLET | Freq: Four times a day (QID) | ORAL | Status: DC | PRN
  Administered 2018-10-04: 5 mg via ORAL
  Filled 2018-10-04: qty 1

## 2018-10-04 MED ORDER — PROMETHAZINE HCL 25 MG/ML IJ SOLN: 6.2500 mg | INTRAMUSCULAR | Status: DC | PRN

## 2018-10-04 MED ORDER — ACETAMINOPHEN 325 MG PO TABS: 650.0000 mg | ORAL_TABLET | Freq: Four times a day (QID) | ORAL | Status: DC | PRN

## 2018-10-04 MED ORDER — STERILE WATER FOR IRRIGATION IR SOLN
Status: DC | PRN
  Administered 2018-10-04: 1000 mL

## 2018-10-04 MED ORDER — LIDOCAINE 2% (20 MG/ML) 5 ML SYRINGE
INTRAMUSCULAR | Status: DC | PRN
  Administered 2018-10-04: 20 mg via INTRAVENOUS

## 2018-10-04 MED ORDER — CALCIUM CARBONATE ANTACID 500 MG PO CHEW
1.0000 | CHEWABLE_TABLET | Freq: Three times a day (TID) | ORAL | Status: DC | PRN
  Administered 2018-10-04: 200 mg via ORAL
  Filled 2018-10-04: qty 1

## 2018-10-04 MED ORDER — FENTANYL CITRATE (PF) 250 MCG/5ML IJ SOLN
INTRAMUSCULAR | Status: AC
  Filled 2018-10-04: qty 5

## 2018-10-04 MED ORDER — BUPIVACAINE-EPINEPHRINE 0.25% -1:200000 IJ SOLN
INTRAMUSCULAR | Status: DC | PRN
  Administered 2018-10-04: 30 mL

## 2018-10-04 MED ORDER — MIDAZOLAM HCL 5 MG/5ML IJ SOLN
INTRAMUSCULAR | Status: DC | PRN
  Administered 2018-10-04: 0.5 mg via INTRAVENOUS

## 2018-10-04 MED ORDER — FENTANYL CITRATE (PF) 100 MCG/2ML IJ SOLN
INTRAMUSCULAR | Status: DC | PRN
  Administered 2018-10-04: 25 ug via INTRAVENOUS
  Administered 2018-10-04: 100 ug via INTRAVENOUS
  Administered 2018-10-04 (×3): 25 ug via INTRAVENOUS

## 2018-10-04 MED ORDER — MIDAZOLAM HCL 2 MG/2ML IJ SOLN: 0.5000 mg | Freq: Once | INTRAMUSCULAR | Status: DC | PRN

## 2018-10-04 SURGICAL SUPPLY — 39 items
ADH SKN CLS APL DERMABOND .7 (GAUZE/BANDAGES/DRESSINGS) ×1
APL PRP STRL LF DISP 70% ISPRP (MISCELLANEOUS) ×1
BAG SPEC RTRVL 10 TROC 200 (ENDOMECHANICALS) ×1
BLADE CLIPPER SURG (BLADE) IMPLANT
CANISTER SUCT 3000ML PPV (MISCELLANEOUS) ×3 IMPLANT
CHLORAPREP W/TINT 26 (MISCELLANEOUS) ×3 IMPLANT
CLIP VESOLOCK MED LG 6/CT (CLIP) ×5 IMPLANT
CLIP VESOLOCK XL 6/CT (CLIP) IMPLANT
COVER SURGICAL LIGHT HANDLE (MISCELLANEOUS) ×3 IMPLANT
COVER WAND RF STERILE (DRAPES) ×1 IMPLANT
DERMABOND ADVANCED (GAUZE/BANDAGES/DRESSINGS) ×2
DERMABOND ADVANCED .7 DNX12 (GAUZE/BANDAGES/DRESSINGS) ×1 IMPLANT
ELECT REM PT RETURN 9FT ADLT (ELECTROSURGICAL) ×3
ELECTRODE REM PT RTRN 9FT ADLT (ELECTROSURGICAL) ×1 IMPLANT
GLOVE BIOGEL PI IND STRL 7.0 (GLOVE) ×1 IMPLANT
GLOVE BIOGEL PI INDICATOR 7.0 (GLOVE) ×2
GLOVE SURG SS PI 7.0 STRL IVOR (GLOVE) ×3 IMPLANT
GOWN STRL REUS W/ TWL LRG LVL3 (GOWN DISPOSABLE) ×3 IMPLANT
GOWN STRL REUS W/TWL LRG LVL3 (GOWN DISPOSABLE) ×9
GRASPER SUT TROCAR 14GX15 (MISCELLANEOUS) ×3 IMPLANT
KIT BASIN OR (CUSTOM PROCEDURE TRAY) ×3 IMPLANT
KIT TURNOVER KIT B (KITS) ×3 IMPLANT
NEEDLE 22X1 1/2 (OR ONLY) (NEEDLE) ×3 IMPLANT
NS IRRIG 1000ML POUR BTL (IV SOLUTION) ×3 IMPLANT
PAD ARMBOARD 7.5X6 YLW CONV (MISCELLANEOUS) ×3 IMPLANT
POUCH RETRIEVAL ECOSAC 10 (ENDOMECHANICALS) ×1 IMPLANT
POUCH RETRIEVAL ECOSAC 10MM (ENDOMECHANICALS) ×2
SCISSORS LAP 5X35 DISP (ENDOMECHANICALS) ×3 IMPLANT
SET IRRIG TUBING LAPAROSCOPIC (IRRIGATION / IRRIGATOR) ×3 IMPLANT
SET TUBE SMOKE EVAC HIGH FLOW (TUBING) ×3 IMPLANT
SLEEVE ENDOPATH XCEL 5M (ENDOMECHANICALS) ×6 IMPLANT
SPECIMEN JAR SMALL (MISCELLANEOUS) ×3 IMPLANT
SUT MNCRL AB 4-0 PS2 18 (SUTURE) ×3 IMPLANT
TOWEL GREEN STERILE (TOWEL DISPOSABLE) ×3 IMPLANT
TOWEL GREEN STERILE FF (TOWEL DISPOSABLE) ×3 IMPLANT
TRAY LAPAROSCOPIC MC (CUSTOM PROCEDURE TRAY) ×3 IMPLANT
TROCAR XCEL 12X100 BLDLESS (ENDOMECHANICALS) ×3 IMPLANT
TROCAR XCEL NON-BLD 5MMX100MML (ENDOMECHANICALS) ×3 IMPLANT
WATER STERILE IRR 1000ML POUR (IV SOLUTION) ×3 IMPLANT

## 2018-10-04 NOTE — Progress Notes (Signed)
Patient admitted to unit from PACU. Patient settled in bed and oriented to unit and hospital routine. Patient has 4 lap sites that are clean dry and intact with skin glue. Pt resting comfortably in bed with call bell in reach and side rails up. Instructed patient to utilize call bell for assistance. Will continue to monitor closely for remainder of shift.

## 2018-10-04 NOTE — Progress Notes (Signed)
This patient has been unavailable to be seen all day due to surgery and PACU care. I have reviewed the operative note, vitals, and lab has been reviewed. And are stable.

## 2018-10-04 NOTE — Discharge Instructions (Signed)
LAPAROSCOPIC SURGERY: POST OP INSTRUCTIONS  ######################################################################  EAT Gradually transition to a high fiber diet with a fiber supplement over the next few weeks after discharge.  Start with a pureed / full liquid diet (see below)  WALK Walk an hour a day.  Control your pain to do that.    CONTROL PAIN Control pain so that you can walk, sleep, tolerate sneezing/coughing, go up/down stairs.  HAVE A BOWEL MOVEMENT DAILY Keep your bowels regular to avoid problems.  OK to try a laxative to override constipation.  OK to use an antidairrheal to slow down diarrhea.  Call if not better after 2 tries  CALL IF YOU HAVE PROBLEMS/CONCERNS Call if you are still struggling despite following these instructions. Call if you have concerns not answered by these instructions  ######################################################################    1. DIET: Follow a light bland diet & liquids the first 24 hours after arrival home, such as soup, liquids, starches, etc.  Be sure to drink plenty of fluids.  Quickly advance to a usual solid diet within a few days.  Avoid fast food or heavy meals as your are more likely to get nauseated or have irregular bowels.  A low-fat, high-fiber diet for the rest of your life is ideal.  2. Take your usually prescribed home medications unless otherwise directed.  3. PAIN CONTROL: a. Pain is best controlled by a usual combination of three different methods TOGETHER: i. Ice/Heat ii. Over the counter pain medication iii. Prescription pain medication b. Most patients will experience some swelling and bruising around the incisions.  Ice packs or heating pads (30-60 minutes up to 6 times a day) will help. Use ice for the first few days to help decrease swelling and bruising, then switch to heat to help relax tight/sore spots and speed recovery.  Some people prefer to use ice alone, heat alone, alternating between ice & heat.   Experiment to what works for you.  Swelling and bruising can take several weeks to resolve.   c. It is helpful to take an over-the-counter pain medication regularly for the first few weeks.  Choose one of the following that works best for you: i. Naproxen (Aleve, etc)  Two 220mg tabs twice a day ii. Ibuprofen (Advil, etc) Three 200mg tabs four times a day (every meal & bedtime) iii. Acetaminophen (Tylenol, etc) 500-650mg four times a day (every meal & bedtime) d. A  prescription for pain medication (such as oxycodone, hydrocodone, tramadol, gabapentin, methocarbamol, etc) should be given to you upon discharge.  Take your pain medication as prescribed.  i. If you are having problems/concerns with the prescription medicine (does not control pain, nausea, vomiting, rash, itching, etc), please call us [phone] to see if we need to switch you to a different pain medicine that will work better for you and/or control your side effect better. ii. If you need a refill on your pain medication, please give us 48 hour notice.  contact your pharmacy.  They will contact our office to request authorization. Prescriptions will not be filled after 5 pm or on week-ends  4. Avoid getting constipated.   a. Between the surgery and the pain medications, it is common to experience some constipation.   b. Increasing fluid intake and taking a fiber supplement (such as Metamucil, Citrucel, FiberCon, MiraLax, etc) 1-2 times a day regularly will usually help prevent this problem from occurring.   c. A mild laxative (prune juice, Milk of Magnesia, MiraLax, etc) should be taken according to   package directions if there are no bowel movements after 48 hours.   5. Watch out for diarrhea.   a. If you have many loose bowel movements, simplify your diet to bland foods & liquids for a few days.   b. Stop any stool softeners and decrease your fiber supplement.   c. Switching to mild anti-diarrheal medications (Kayopectate, Pepto  Bismol) can help.   d. If this worsens or does not improve, please call us.  6. Wash / shower every day.  You may shower over the dressings as they are waterproof.  Continue to shower over incision(s) after the dressing is off.  7. Remove your waterproof bandages 5 days after surgery.  You may leave the incision open to air.  You may replace a dressing/Band-Aid to cover the incision for comfort if you wish.   8. ACTIVITIES as tolerated:   a. You may resume regular (light) daily activities beginning the next day--such as daily self-care, walking, climbing stairs--gradually increasing activities as tolerated.  If you can walk 30 minutes without difficulty, it is safe to try more intense activity such as jogging, treadmill, bicycling, low-impact aerobics, swimming, etc. b. Save the most intensive and strenuous activity for last such as sit-ups, heavy lifting, contact sports, etc  Refrain from any heavy lifting or straining until you are off narcotics for pain control.   c. DO NOT PUSH THROUGH PAIN.  Let pain be your guide: If it hurts to do something, don't do it.  Pain is your body warning you to avoid that activity for another week until the pain goes down. d. You may drive when you are no longer taking prescription pain medication, you can comfortably wear a seatbelt, and you can safely maneuver your car and apply brakes. e. You may have sexual intercourse when it is comfortable.  9. FOLLOW UP in our office a. Please call [facility] at [phone] to set up an appointment to see your surgeon in the office for a follow-up appointment approximately 2-3 weeks after your surgery. b. Make sure that you call for this appointment the day you arrive home to insure a convenient appointment time.  10. IF YOU HAVE DISABILITY OR FAMILY LEAVE FORMS, BRING THEM TO THE OFFICE FOR PROCESSING.  DO NOT GIVE THEM TO YOUR DOCTOR.   WHEN TO CALL US [phone]: 1. Poor pain control 2. Reactions / problems with new  medications (rash/itching, nausea, etc)  3. Fever over 101.5 F (38.5 C) 4. Inability to urinate 5. Nausea and/or vomiting 6. Worsening swelling or bruising 7. Continued bleeding from incision. 8. Increased pain, redness, or drainage from the incision   The clinic staff is available to answer your questions during regular business hours (8:30am-5pm).  Please don't hesitate to call and ask to speak to one of our nurses for clinical concerns.   If you have a medical emergency, go to the nearest emergency room or call 911.  A surgeon from [facility] is always on call at the hospitals   [facility] [address], [address], [place], [place]  [address] ? MAIN: [phone] ? TOLL FREE: [phone] ?  FAX [phone] [email or web address]   

## 2018-10-04 NOTE — Transfer of Care (Signed)
Immediate Anesthesia Transfer of Care Note  Patient: Edgar Brewer  Procedure(s) Performed: LAPAROSCOPIC CHOLECYSTECTOMY (N/A Abdomen)  Patient Location: PACU  Anesthesia Type:General  Level of Consciousness: responds to stimulation  Airway & Oxygen Therapy: Patient Spontanous Breathing  Post-op Assessment: Report given to RN and Post -op Vital signs reviewed and stable  Post vital signs: Reviewed and stable  Last Vitals:  Vitals Value Taken Time  BP 171/71 10/04/18 1319  Temp 36.6 C 10/04/18 1319  Pulse 81 10/04/18 1323  Resp 22 10/04/18 1323  SpO2 96 % 10/04/18 1323  Vitals shown include unvalidated device data.  Last Pain:  Vitals:   10/04/18 0909  TempSrc: Oral  PainSc:          Complications: No apparent anesthesia complications

## 2018-10-04 NOTE — Progress Notes (Signed)
All pt belongings sent with patients family up to 6N.

## 2018-10-04 NOTE — Op Note (Signed)
PATIENT:  Edgar Brewer  58 y.o. male  PRE-OPERATIVE DIAGNOSIS:  LFT elevation  POST-OPERATIVE DIAGNOSIS:  LFT elevation  PROCEDURE:  Procedure(s): LAPAROSCOPIC CHOLECYSTECTOMY   SURGEON:  Surgeon(s): , Arta Bruce, MD  ASSISTANT: Modena Jansky  ANESTHESIA:   local and general  Indications for procedure: Edgar Brewer is a 58 y.o. male with symptoms of Abdominal pain and Nausea and vomiting consistent with gallbladder disease, Confirmed by Ultrasound and CT.  Description of procedure: The patient was brought into the operative suite, placed supine. Anesthesia was administered with endotracheal tube. Patient was strapped in place and foot board was secured. All pressure points were offloaded by foam padding. The patient was prepped and draped in the usual sterile fashion.  A small incision was made to the right of the umbilicus. A 25mm trocar was inserted into the peritoneal cavity with optical entry. Pneumoperitoneum was applied with high flow low pressure. 2 90mm trocars were placed in the RUQ. A 92mm trocar was placed in the subxiphoid space. Marcaine was infused to the subxiphoid space and lateral upper right abdomen in the transversus abdominis plane. Next the patient was placed in reverse trendelenberg. The gallbladder was distended with mild inflammation.  The gallbladder was retracted cephalad and lateral. The peritoneum was reflected off the infundibulum working lateral to medial. The cystic duct and cystic artery were identified and further dissection revealed a critical view. The cystic duct and cystic artery were doubly clipped and ligated.   The gallbladder was removed off the liver bed with cautery. The Gallbladder was placed in a specimen bag. The gallbladder fossa was irrigated and hemostasis was applied with cautery. The gallbladder was removed via the 59mm trocar. The fascial defect was closed with interrupted 0 vicryl suture via laparoscopic trans-fascial suture passer.  Pneumoperitoneum was removed, all trocar were removed. All incisions were closed with 4-0 monocryl subcuticular stitch. The patient woke from anesthesia and was brought to PACU in stable condition. All counts were correct  Findings: moderate gallbladder inflammation and distension  Specimen: gallbladder  Blood loss: 30 ml  Local anesthesia: 30 ml marcaine  Complications: none  PLAN OF CARE: Admit to inpatient   PATIENT DISPOSITION:  PACU - hemodynamically stable.  Edgar Brewer, M.D. General, Bariatric, & Minimally Invasive Surgery [facility]

## 2018-10-04 NOTE — Progress Notes (Signed)
Pt being transported to OR via transport.  Report given to pre-op nurse.  Tele monitor removed, CHG bath preformed, IVs working. All belongings secured at bedside.

## 2018-10-04 NOTE — Anesthesia Preprocedure Evaluation (Addendum)
Anesthesia Evaluation  Patient identified by MRN, date of birth, ID band Patient awake    Reviewed: Allergy & Precautions, NPO status , Patient's Chart, lab work & pertinent test results  History of Anesthesia Complications Negative for: history of anesthetic complications  Airway Mallampati: III  TM Distance: >3 FB Neck ROM: Full    Dental  (+) Poor Dentition, Missing, Dental Advisory Given   Pulmonary neg pulmonary ROS, former smoker (quit 2016),  10/02/2018 SARS coronavirus NEG   breath sounds clear to auscultation       Cardiovascular hypertension (took himself off of meds), (-) angina Rhythm:Regular Rate:Normal  '18 ECHO: EF 60-65%, mild AS, mild MR   Neuro/Psych CVA (R arm weakness), Residual Symptoms    GI/Hepatic GERD  Medicated,Elevated LFTs with acute chole N/V with acute chole   Endo/Other  diabetes (glu 97), Oral Hypoglycemic Agents  Renal/GU ESRF and DialysisRenal disease (K+ 4.5)     Musculoskeletal  (+) Arthritis ,   Abdominal (+) + obese,   Peds  Hematology   Anesthesia Other Findings   Reproductive/Obstetrics                            Anesthesia Physical Anesthesia Plan  ASA: III  Anesthesia Plan: General   Post-op Pain Management:    Induction: Intravenous and Cricoid pressure planned  PONV Risk Score and Plan:   Airway Management Planned: Oral ETT and Video Laryngoscope Planned  Additional Equipment:   Intra-op Plan:   Post-operative Plan: Extubation in OR  Informed Consent: I have reviewed the patients History and Physical, chart, labs and discussed the procedure including the risks, benefits and alternatives for the proposed anesthesia with the patient or authorized representative who has indicated his/her understanding and acceptance.     Dental advisory given  Plan Discussed with: CRNA and Surgeon  Anesthesia Plan Comments:        Anesthesia  Quick Evaluation

## 2018-10-04 NOTE — Plan of Care (Signed)

## 2018-10-04 NOTE — Progress Notes (Addendum)
Subjective: CC: Patient is unsure if he had any abdominal pain yesterday. He denies N/V but did have 2-3 episodes of diarrhea. He reports he ate some broth yesterday and doesn't remember that causing any abdominal pain.   Objective: Vital signs in last 24 hours: Temp:  [98.2 F (36.8 C)-99.6 F (37.6 C)] 99 F (37.2 C) (09/10 0325) Pulse Rate:  [79-85] 83 (09/10 0325) Resp:  [16-18] 16 (09/10 0325) BP: (95-143)/(48-80) 133/68 (09/10 0325) SpO2:  [94 %-96 %] 95 % (09/10 0325) Weight:  [90.3 kg] 90.3 kg (09/10 0325) Last BM Date: 10/03/18  Intake/Output from previous day: 09/09 0701 - 09/10 0700 In: 600 [P.O.:600] Out: 1000  Intake/Output this shift: No intake/output data recorded.  PE: Gen:  Alert, NAD, pleasant Pulm:  Normal rate and effort Abd: Soft, NT/ND, +BS Ext:  No LE edema  Lab Results:  Recent Labs    10/02/18 1726  WBC 8.7  HGB 11.4*  HCT 34.8*  PLT 192   BMET Recent Labs    10/02/18 1726 10/03/18 0530  NA 134* 136  K 5.1 4.4  CL 95* 95*  CO2 23 26  GLUCOSE 286* 109*  BUN 45* 51*  CREATININE 7.91* 8.71*  CALCIUM 8.3* 8.5*   PT/INR No results for input(s): LABPROT, INR in the last 72 hours. CMP     Component Value Date/Time   NA 136 10/03/2018 0530   NA 140 03/04/2016 1317   K 4.4 10/03/2018 0530   CL 95 (L) 10/03/2018 0530   CO2 26 10/03/2018 0530   GLUCOSE 109 (H) 10/03/2018 0530   BUN 51 (H) 10/03/2018 0530   BUN 47 (H) 03/04/2016 1317   CREATININE 8.71 (H) 10/03/2018 0530   CALCIUM 8.5 (L) 10/03/2018 0530   PROT 7.1 10/03/2018 0530   PROT 5.2 (L) 02/29/2016 1439   ALBUMIN 3.2 (L) 10/03/2018 0530   ALBUMIN 3.1 (L) 02/29/2016 1439   AST 216 (H) 10/03/2018 0530   ALT 265 (H) 10/03/2018 0530   ALKPHOS 736 (H) 10/03/2018 0530   BILITOT 3.7 (H) 10/03/2018 0530   BILITOT 0.2 02/29/2016 1439   GFRNONAA 6 (L) 10/03/2018 0530   GFRAA 7 (L) 10/03/2018 0530   Lipase     Component Value Date/Time   LIPASE 74 (H) 10/02/2018  1822       Studies/Results: Ct Head Wo Contrast  Result Date: 10/02/2018 CLINICAL DATA:  Altered level of consciousness EXAM: CT HEAD WITHOUT CONTRAST TECHNIQUE: Contiguous axial images were obtained from the base of the skull through the vertex without intravenous contrast. COMPARISON:  None. FINDINGS: Brain: No evidence of acute territorial infarction, hemorrhage, hydrocephalus,extra-axial collection or mass lesion/mass effect. Normal gray-white differentiation. Ventricles are normal in size and contour. Vascular: No hyperdense vessel or unexpected calcification. Skull: The skull is intact. No fracture or focal lesion identified. Sinuses/Orbits: The visualized paranasal sinuses and mastoid air cells are clear. The orbits and globes intact. Other: None IMPRESSION: No acute intracranial pathology. Electronically Signed   By: Prudencio Pair M.D.   On: 10/02/2018 19:55   Ct Abdomen Pelvis W Contrast  Result Date: 10/02/2018 CLINICAL DATA:  Abdominal distention, nausea, vomiting. Question gallbladder disease. EXAM: CT ABDOMEN AND PELVIS WITH CONTRAST TECHNIQUE: Multidetector CT imaging of the abdomen and pelvis was performed using the standard protocol following bolus administration of intravenous contrast. CONTRAST:  168mL OMNIPAQUE IOHEXOL 300 MG/ML  SOLN COMPARISON:  None. FINDINGS: Lower chest: Cardiomegaly.  No confluent opacities or effusions. Hepatobiliary: Multiple gallstones  layering within the gallbladder. No focal hepatic abnormality or biliary ductal dilatation. No visible gallbladder wall thickening. Pancreas: No focal abnormality or ductal dilatation. Spleen: No focal abnormality.  Normal size. Adrenals/Urinary Tract: No adrenal abnormality. No focal renal abnormality. No stones or hydronephrosis. Urinary bladder is unremarkable. Stomach/Bowel: Colonic diverticulosis. No active diverticulitis. Stomach and small bowel decompressed, unremarkable. Vascular/Lymphatic: No evidence of aneurysm or  adenopathy. Aortic atherosclerosis. Reproductive: No visible focal abnormality. Other: No free fluid or free air. Musculoskeletal: No acute bony abnormality. IMPRESSION: Colonic diverticulosis crash that cholelithiasis. No CT evidence for cholecystitis. Colonic diverticulosis.  No diverticulitis. Aortic atherosclerosis. No acute findings in the abdomen or pelvis. Electronically Signed   By: Rolm Baptise M.D.   On: 10/02/2018 19:57   Dg Abdomen Acute W/chest  Result Date: 10/02/2018 CLINICAL DATA: Nausea and vomiting EXAM: DG ABDOMEN ACUTE W/ 1V CHEST COMPARISON:  Chest x-ray 07/06/2016 FINDINGS: Single-view chest demonstrates no focal airspace disease or effusion. Cardiomediastinal silhouette within normal limits. No pneumothorax. No free air beneath the diaphragm. Borderline to mild gaseous distention of central small bowel loops with scattered colon gas present. IMPRESSION: 1. No radiographic evidence for acute cardiopulmonary abnormality. 2. Borderline to mild gaseous dilatation of central small bowel though with scattered colon gas present, findings could be secondary to enteritis or ileus, however partial or developing bowel obstruction could also produce this appearance. Electronically Signed   By: Donavan Foil M.D.   On: 10/02/2018 18:58   US Abdomen Limited Ruq  Result Date: 10/02/2018 CLINICAL DATA:  Elevated LFTs EXAM: ULTRASOUND ABDOMEN LIMITED RIGHT UPPER QUADRANT COMPARISON:  CT 10/02/2018 FINDINGS: Gallbladder: Sludge in multiple small stones in the gallbladder measuring up to 1.4 cm. Increased wall thickness at 5.7 mm. Negative sonographic Murphy. Common bile duct: Diameter: 2.9 mm Liver: Increased hepatic echogenicity without focal hepatic abnormality. Portal vein is patent on color Doppler imaging with normal direction of blood flow towards the liver. Other: None. IMPRESSION: 1. Sludge and stones within the gallbladder with increased wall thickness but negative sonographic Murphy. Findings  are nonspecific and could be secondary to acute or chronic cholecystitis, liver disease, or edema forming states. If acute gallbladder disease remains a concern, further evaluation with hepatobiliary nuclear medicine imaging could be obtained. 2. Slightly echogenic liver suggesting steatosis Electronically Signed   By: Donavan Foil M.D.   On: 10/02/2018 21:35    Anti-infectives: Anti-infectives (From admission, onward)   None      Assessment/Plan ESRD on HD CHF Pulmonary HTN DM2 - Last A1c 6.9 (7/18) Anion Gap Acidosis  Hyperglycemia  - Above per TRH -   Elevated LFT's Cholelithiasis  - Await MRCP results - Await AM labs to trend LFT's  - If patient required surgery, he would need cardiac clearance prior to this - Mobilize and IS  FEN: Keep NPO while awaiting MRCP results ID: None VTE: SCDs, Heparin   LOS: 2 days    Jillyn Ledger , [facility] 10/04/2018, 7:23 AM Pager: [phone]

## 2018-10-05 ENCOUNTER — Encounter (HOSPITAL_COMMUNITY): Payer: Self-pay | Admitting: General Surgery

## 2018-10-05 LAB — CBC
HCT: 33.9 % — ABNORMAL LOW (ref 39.0–52.0)
Hemoglobin: 11.2 g/dL — ABNORMAL LOW (ref 13.0–17.0)
MCH: 30.1 pg (ref 26.0–34.0)
MCHC: 33 g/dL (ref 30.0–36.0)
MCV: 91.1 fL (ref 80.0–100.0)
Platelets: 218 K/uL (ref 150–400)
RBC: 3.72 MIL/uL — ABNORMAL LOW (ref 4.22–5.81)
RDW: 14.1 % (ref 11.5–15.5)
WBC: 8.8 K/uL (ref 4.0–10.5)
nRBC: 0 % (ref 0.0–0.2)

## 2018-10-05 LAB — RENAL FUNCTION PANEL
Albumin: 3 g/dL — ABNORMAL LOW (ref 3.5–5.0)
Anion gap: 20 — ABNORMAL HIGH (ref 5–15)
BUN: 59 mg/dL — ABNORMAL HIGH (ref 6–20)
CO2: 22 mmol/L (ref 22–32)
Calcium: 7.7 mg/dL — ABNORMAL LOW (ref 8.9–10.3)
Chloride: 92 mmol/L — ABNORMAL LOW (ref 98–111)
Creatinine, Ser: 10.15 mg/dL — ABNORMAL HIGH (ref 0.61–1.24)
GFR calc Af Amer: 6 mL/min — ABNORMAL LOW
GFR calc non Af Amer: 5 mL/min — ABNORMAL LOW
Glucose, Bld: 290 mg/dL — ABNORMAL HIGH (ref 70–99)
Phosphorus: 7.9 mg/dL — ABNORMAL HIGH (ref 2.5–4.6)
Potassium: 5.6 mmol/L — ABNORMAL HIGH (ref 3.5–5.1)
Sodium: 134 mmol/L — ABNORMAL LOW (ref 135–145)

## 2018-10-05 LAB — GLUCOSE, CAPILLARY: Glucose-Capillary: 173 mg/dL — ABNORMAL HIGH (ref 70–99)

## 2018-10-05 MED ORDER — SODIUM CHLORIDE 0.9 % IV SOLN: 100.0000 mL | INTRAVENOUS | Status: DC | PRN

## 2018-10-05 MED ORDER — LIDOCAINE-PRILOCAINE 2.5-2.5 % EX CREA: 1.0000 "application " | TOPICAL_CREAM | CUTANEOUS | Status: DC | PRN

## 2018-10-05 MED ORDER — OXYCODONE HCL 5 MG PO TABS
5.0000 mg | ORAL_TABLET | ORAL | Status: DC | PRN
  Administered 2018-10-05 – 2018-10-06 (×3): 10 mg via ORAL
  Administered 2018-10-06: 5 mg via ORAL
  Filled 2018-10-05 (×2): qty 2
  Filled 2018-10-05: qty 1
  Filled 2018-10-05: qty 2

## 2018-10-05 MED ORDER — LIDOCAINE HCL (PF) 1 % IJ SOLN: 5.0000 mL | INTRAMUSCULAR | Status: DC | PRN

## 2018-10-05 MED ORDER — DOXERCALCIFEROL 4 MCG/2ML IV SOLN
INTRAVENOUS | Status: AC
  Administered 2018-10-05: 10 ug via INTRAVENOUS
  Filled 2018-10-05: qty 6

## 2018-10-05 MED ORDER — PHENOL 1.4 % MT LIQD
1.0000 | OROMUCOSAL | Status: DC | PRN
  Administered 2018-10-05: 09:00:00 1 via OROMUCOSAL
  Filled 2018-10-05: qty 177

## 2018-10-05 MED ORDER — ALTEPLASE 2 MG IJ SOLR: 2.0000 mg | Freq: Once | INTRAMUSCULAR | Status: DC | PRN

## 2018-10-05 MED ORDER — PENTAFLUOROPROP-TETRAFLUOROETH EX AERO: 1.0000 "application " | INHALATION_SPRAY | CUTANEOUS | Status: DC | PRN

## 2018-10-05 MED ORDER — HEPARIN SODIUM (PORCINE) 1000 UNIT/ML DIALYSIS: 1000.0000 [IU] | INTRAMUSCULAR | Status: DC | PRN

## 2018-10-05 NOTE — Progress Notes (Signed)
Patient left unit for dialysis, report given to [facility].

## 2018-10-05 NOTE — Progress Notes (Signed)
MEWS Guidelines - (patients age 58 and over)  Red - At High Risk for Deterioration Yellow - At risk for Deterioration  1. Go to room and assess patient 2. Validate data. Is this patient's baseline? If data confirmed: 3. Is this an acute change? 4. Administer prn meds/treatments as ordered. 5. Note Sepsis score 6. Review goals of care 7. Sports coach, RRT nurse and Provider. 8. Ask Provider to come to bedside.  9. Document patient condition/interventions/response. 10. Increase frequency of vital signs and focused assessments to at least q15 minutes x 4, then q30 minutes x2. - If stable, then q1h x3, then q4h x3 and then q8h or dept. routine. - If unstable, contact Provider & RRT nurse. Prepare for possible transfer. 11. Add entry in progress notes using the smart phrase ".MEWS". 1. Go to room and assess patient 2. Validate data. Is this patient's baseline? If data confirmed: 3. Is this an acute change? 4. Administer prn meds/treatments as ordered? 5. Note Sepsis score 6. Review goals of care 7. Sports coach and Provider 8. Call RRT nurse as needed. 9. Document patient condition/interventions/response. 10. Increase frequency of vital signs and focused assessments to at least q2h x2. - If stable, then q4h x2 and then q8h or dept. routine. - If unstable, contact Provider & RRT nurse. Prepare for possible transfer. 11. Add entry in progress notes using the smart phrase ".MEWS".  Green - Likely stable Lavender - Comfort Care Only  1. Continue routine/ordered monitoring.  2. Review goals of care. 1. Continue routine/ordered monitoring. 2. Review goals of care.   Paged and notified Dr. Benny Lennert, Pain medication administered for post operative pain to abdomen. Patient resting comfortably in no acute distress. Will continue to monitor for remainder of shift.

## 2018-10-05 NOTE — Procedures (Signed)
Patient seen on Hemodialysis. BP 136/64 (BP Location: Right Arm)   Pulse 87   Temp 98.4 F (36.9 C) (Oral)   Resp (!) 24   Ht 5\' 8"  (1.727 m)   Wt 90.3 kg Comment: scale b  SpO2 90%   BMI 30.27 kg/m   QB 400, UF goal 1L Tolerating treatment without complaints at this time.   Elmarie Shiley MD [facility]. Office # [phone] Pager # [phone] 1:32 PM

## 2018-10-05 NOTE — Progress Notes (Signed)
  Progress Note: General Surgery Service   Assessment/Plan: Principal Problem:   Choledocholithiasis Active Problems:   Diabetes mellitus type II, non insulin dependent ([place])   Accelerated hypertension   ESRD (end stage renal disease) on dialysis [facility])  s/p Procedure(s): LAPAROSCOPIC CHOLECYSTECTOMY 10/04/2018  POD 1 lap chole, pain moderate -continue diet -no further abx needed -ambulate -pain control  FEN- carb mod diet VTE- hep subcu ID- none Dispo- ok to discharge from surgical standpoint    LOS: 3 days  Chief Complaint/Subjective: Pain moderate, no nausea, has not gotten out of bed yet this morning  Objective: Vital signs in last 24 hours: Temp:  [97.9 F (36.6 C)-99.8 F (37.7 C)] 98.3 F (36.8 C) (09/11 0352) Pulse Rate:  [76-92] 88 (09/11 0352) Resp:  [14-33] 18 (09/11 0352) BP: (125-177)/(52-78) 132/62 (09/11 0352) SpO2:  [89 %-99 %] 90 % (09/11 0352) Last BM Date: 10/03/18  Intake/Output from previous day: 09/10 0701 - 09/11 0700 In: 500 [I.V.:400] Out: -  Intake/Output this shift: No intake/output data recorded.  Lungs: nonlabored  Cardiovascular: RRR  Abd: soft, ATTP, incisions c/d/i  Extremities: no edema  Neuro: AOx4  Lab Results: CBC  Recent Labs    10/02/18 1726 10/04/18 0823  WBC 8.7 4.7  HGB 11.4* 12.0*  HCT 34.8* 36.2*  PLT 192 202   BMET Recent Labs    10/03/18 0530 10/04/18 0823  NA 136 136  K 4.4 4.5  CL 95* 96*  CO2 26 22  GLUCOSE 109* 96  BUN 51* 28*  CREATININE 8.71* 6.97*  CALCIUM 8.5* 8.6*   PT/INR No results for input(s): LABPROT, INR in the last 72 hours. ABG No results for input(s): PHART, HCO3 in the last 72 hours.  Invalid input(s): PCO2, PO2  Studies/Results:  Anti-infectives: Anti-infectives (From admission, onward)   Start     Dose/Rate Route Frequency Ordered Stop   10/04/18 0845  cefoTEtan (CEFOTAN) 2 g in sodium chloride 0.9 % 100 mL IVPB     2 g 200 mL/hr over 30 Minutes Intravenous  On call to O.R. 10/04/18 0838 10/04/18 1208      Medications: Scheduled Meds: . Chlorhexidine Gluconate Cloth  6 each Topical Q0600  . doxercalciferol  10 mcg Intravenous Q M,W,F-HD  . heparin injection (subcutaneous)  5,000 Units Subcutaneous Q8H   Continuous Infusions: PRN Meds:.acetaminophen, calcium carbonate, hydrALAZINE, ondansetron (ZOFRAN) IV, oxyCODONE, phenol  Mickeal Skinner, MD [facility]

## 2018-10-05 NOTE — Plan of Care (Signed)
  Problem: Nutrition: Goal: Adequate nutrition will be maintained Outcome: Progressing   Problem: Coping: Goal: Level of anxiety will decrease Outcome: Progressing   Problem: Pain Managment: Goal: General experience of comfort will improve Outcome: Progressing   

## 2018-10-05 NOTE — Progress Notes (Signed)
Patient returned to unit from dialysis.

## 2018-10-05 NOTE — Progress Notes (Signed)
PROGRESS NOTE  Edgar Brewer TKW:[id number] DOB: Dec 17, 1960 DOA: 10/02/2018 PCP: System, Pcp Not In  Brief History   The patient is a 58 yr old man who presented to the [facility] with complaints of abdominal pain, nausea and vomiting.He was found to have choledocholithiasis. General Surgery was consulted. The patient went for laprascopic cholecystectomyon 10/04/2018. He tolerated the procedure well.   Consultants  . General Surgery . Nephrology  Procedures  . Lap Chole . Dialysis  Antibiotics   Anti-infectives (From admission, onward)   Start     Dose/Rate Route Frequency Ordered Stop   10/04/18 0845  cefoTEtan (CEFOTAN) 2 g in sodium chloride 0.9 % 100 mL IVPB     2 g 200 mL/hr over 30 Minutes Intravenous On call to O.R. 10/04/18 [id number] 10/04/18 1208    .  Marland Kitchen   Subjective  The patient was lying in bed. He appeared uncomfortably. He stated that he was nauseated.   Objective   Vitals:  Vitals:   10/05/18 1604 10/05/18 1626  BP: (!) 114/58 130/71  Pulse: 81 89  Resp: (!) 27 (!) 28  Temp: 99.1 F (37.3 C) 98.1 F (36.7 C)  SpO2: 94% 93%    Exam:  Constitutional:  . The patient was awake, alert, and oriented x 3. No acute distress. Respiratory:  . No increased work of breathing. . No wheezes, rales, or rhonchi. . No tactile fremitus. Cardiovascular:  . Regular rate and rhythm . No murmurs, ectopy, or gallups . No lateral PMI. No thrills. Abdomen:  . Abdomen is soft, slightly tender, distended. . No hernias, masses, or organomegaly. . Normoactive bowel sounds. Musculoskeletal:  . No cyanosis, clubbing, or edema Skin:  . No rashes, lesions, ulcers . palpation of skin: no induration or nodules Neurologic:  . CN 2-12 intact . Sensation all 4 extremities intact Psychiatric:  . Mental status o Mood, affect appropriate o Orientation to person, place, time  . judgment and insight appear intact     I have personally reviewed the following:   Today's Data  .  BMP, CBC, Vitals  Scheduled Meds: . Chlorhexidine Gluconate Cloth  6 each Topical Q0600  . doxercalciferol  10 mcg Intravenous Q M,W,F-HD  . heparin injection (subcutaneous)  5,000 Units Subcutaneous Q8H   Continuous Infusions:  Principal Problem:   Choledocholithiasis Active Problems:   Diabetes mellitus type II, non insulin dependent ([facility])   Accelerated hypertension   ESRD (end stage renal disease) on dialysis ([facility])   LOS: 3 days   A & P   Acute transaminitis in the setting of gallstones/suspected choledocholithiasis: S/P laparoscopic cholecystectomy. Abdomen remains a little distended, and the patient is nauseated. MRCP demonstrated a cholelithiasis with thickened gallbladder wall, pancreas divisum, and some evidence of hemochromatosis or hemosiderosis. Transaminases are declining.  Hyperbilirubinemia: Resolving. Monitor.  Mildly elevated ammonia in the setting of hepatic steatosis: No clinical evidence of hepatic encephalopathy. Monitor mental status.  Elevated lipase level: Presented with lipase 94.Monitor. Asymptomatic.  ESRD on HD MWF: HD today. Patient with mild tachypnea upon returning from HD. SaO2   Essential hypertension: Blood pressures currently at goal without antihypertensives.  Non-insulin-dependent type 2 diabetes: HbA1c 6.9 on 08/11/2018. Continue sensitive SSI. Avoid hypoglycemia in the setting of end-stage renal disease  I have seen and examined this patient myself. I have spent 28 minutes in his evaluation and care.  DVT prophylaxis:SCDs at this time, pending evaluation by surgery Code Status:Full code. Discussed with the patient. Family Communication:No family available. Disposition Plan:  Home  Orest Dygert, DO [facility] Direct contact: see [email or web address]  7PM-7AM contact night coverage as above 10/05/2018, 6:24 PM  LOS: 3 days

## 2018-10-05 NOTE — Progress Notes (Signed)
[facility] Progress Note   Subjective:  Seen in room. Mild abd pain s/p cholecystectomy yesterday. No CP/dyspnea.   Objective Vitals:   10/04/18 2136 10/04/18 2345 10/05/18 0152 10/05/18 0352  BP: 130/69 129/62 136/66 132/62  Pulse: 92 91 91 88  Resp: 18 17 18 18   Temp: 99.8 F (37.7 C) 99.5 F (37.5 C) 98.6 F (37 C) 98.3 F (36.8 C)  TempSrc: Oral Oral Oral Oral  SpO2: 93% 92% 92% 90%  Weight:      Height:       Physical Exam General: Well appearing man, NAD Heart: RRR; no murmur Lungs: CTAB Abdomen: soft, distended. 3 small post incisions - dry. Extremities: No LE edema Dialysis Access: L AVF + thrill  Additional Objective Labs: Basic Metabolic Panel: Recent Labs  Lab 10/02/18 1726 10/03/18 0530 10/04/18 0823  NA 134* 136 136  K 5.1 4.4 4.5  CL 95* 95* 96*  CO2 23 26 22   GLUCOSE 286* 109* 96  BUN 45* 51* 28*  CREATININE 7.91* 8.71* 6.97*  CALCIUM 8.3* 8.5* 8.6*   Liver Function Tests: Recent Labs  Lab 10/02/18 1726 10/03/18 0530 10/04/18 0823  AST 308* 216* 117*  ALT 306* 265* 188*  ALKPHOS 762* 736* 693*  BILITOT 3.6* 3.7* 3.0*  PROT 7.7 7.1 7.0  ALBUMIN 3.6 3.2* 3.3*   Recent Labs  Lab 10/02/18 1822  LIPASE 74*   CBC: Recent Labs  Lab 10/02/18 1726 10/04/18 0823  WBC 8.7 4.7  NEUTROABS 7.7  --   HGB 11.4* 12.0*  HCT 34.8* 36.2*  MCV 90.4 90.5  PLT 192 202   Studies/Results: Mr Abdomen Mrcp Wo Contrast  Result Date: 10/04/2018 CLINICAL DATA:  Jaundice, abdominal pain. End stage renal disease. EXAM: MRI ABDOMEN WITHOUT CONTRAST  (INCLUDING MRCP) TECHNIQUE: Multiplanar multisequence MR imaging of the abdomen was performed. Heavily T2-weighted images of the biliary and pancreatic ducts were obtained, and three-dimensional MRCP images were rendered by post processing. COMPARISON:  Multiple exams, including CT and ultrasound exams from 09/08/120 FINDINGS: Lower chest: Mild cardiomegaly. Hepatobiliary: Multiple gallstones  are present in the gallbladder, the largest approximately 1.1 cm in diameter. Mild gallbladder wall thickening is present no biliary dilatation is identified. Subject to the limitations of motion artifact, no filling defect is identified in the biliary tree to suggest choledocholithiasis. There is reduced hepatic and splenic signal on inphase images compared to out of phase images, raising the possibility of mild hemochromatosis. No significant focal liver lesion on today's noncontrast exam. Pancreas:  Pancreas divisum.  Otherwise unremarkable. Spleen: Dropout of signal in the spleen on inphase images raising the possibility of hemosiderosis. The spleen measures 13.4 by 6.0 by 14.1 cm (volume = 590 cm^3). Adrenals/Urinary Tract: Mild bilateral renal atrophy. Adrenal glands unremarkable. Stomach/Bowel: Small diverticulum of the transverse duodenum without signs of inflammation. Widespread colonic diverticulosis. Vascular/Lymphatic:  Aortoiliac atherosclerotic vascular disease. Other:  No supplemental non-categorized findings. Musculoskeletal: Unremarkable IMPRESSION: 1. Cholelithiasis and gallbladder wall thickening. Correlate clinically in assessing for acute cholecystitis. 2. No biliary dilatation or appreciable choledocholithiasis. 3. There is some dropout of signal in both the liver and spleen on inphase images, potentially from hemosiderosis or hemochromatosis. 4. Pancreas divisum. 5. Colonic diverticulosis. 6.  Aortic Atherosclerosis (ICD10-I70.0). Electronically Signed   By: Van Clines M.D.   On: 10/04/2018 08:08   Mr 3d Recon At Scanner  Result Date: 10/04/2018 CLINICAL DATA:  Jaundice, abdominal pain. End stage renal disease. EXAM: MRI ABDOMEN WITHOUT CONTRAST  (INCLUDING MRCP) TECHNIQUE:  Multiplanar multisequence MR imaging of the abdomen was performed. Heavily T2-weighted images of the biliary and pancreatic ducts were obtained, and three-dimensional MRCP images were rendered by post  processing. COMPARISON:  Multiple exams, including CT and ultrasound exams from 09/08/120 FINDINGS: Lower chest: Mild cardiomegaly. Hepatobiliary: Multiple gallstones are present in the gallbladder, the largest approximately 1.1 cm in diameter. Mild gallbladder wall thickening is present no biliary dilatation is identified. Subject to the limitations of motion artifact, no filling defect is identified in the biliary tree to suggest choledocholithiasis. There is reduced hepatic and splenic signal on inphase images compared to out of phase images, raising the possibility of mild hemochromatosis. No significant focal liver lesion on today's noncontrast exam. Pancreas:  Pancreas divisum.  Otherwise unremarkable. Spleen: Dropout of signal in the spleen on inphase images raising the possibility of hemosiderosis. The spleen measures 13.4 by 6.0 by 14.1 cm (volume = 590 cm^3). Adrenals/Urinary Tract: Mild bilateral renal atrophy. Adrenal glands unremarkable. Stomach/Bowel: Small diverticulum of the transverse duodenum without signs of inflammation. Widespread colonic diverticulosis. Vascular/Lymphatic:  Aortoiliac atherosclerotic vascular disease. Other:  No supplemental non-categorized findings. Musculoskeletal: Unremarkable IMPRESSION: 1. Cholelithiasis and gallbladder wall thickening. Correlate clinically in assessing for acute cholecystitis. 2. No biliary dilatation or appreciable choledocholithiasis. 3. There is some dropout of signal in both the liver and spleen on inphase images, potentially from hemosiderosis or hemochromatosis. 4. Pancreas divisum. 5. Colonic diverticulosis. 6.  Aortic Atherosclerosis (ICD10-I70.0). Electronically Signed   By: Van Clines M.D.   On: 10/04/2018 08:08   Medications:  . Chlorhexidine Gluconate Cloth  6 each Topical Q0600  . doxercalciferol  10 mcg Intravenous Q M,W,F-HD  . heparin injection (subcutaneous)  5,000 Units Subcutaneous Q8H    Dialysis Orders: MWF at  [facility] 4.25 hours 400/800 EDW 92 2K 2 Ca left upper AVF heparin 2600 venofer 50 /week last 9/2 no ESA hectorol 10 Recent labs: hgb 11 38% sat iPTH 600 - only Mircera was 30 7/31  Assessment/Plan: 1.  Choledocholithiasis: LFTS still ^ - improving. S/p lap chole 9/10.  2.  ESRD: HD today per usual MWF schedule - no heparin d/t recent surgery. 3.  Hypertension/volume: BP controlled, below prior EDW. Low UF goal. 4.  Anemia: Hgb 12 pre-op, repeat today and give ESA prn. 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 5.  Metabolic bone disease: Ca ok, Phos pending. Continue home binder/VDRA. 6.  Nutrition: Alb down, eating today - should improve. Will add pro-stat supplements 7.  DM: Per primary.  Veneta Penton, PA-C 10/05/2018, 9:25 AM  [facility] Pager: [phone]

## 2018-10-06 LAB — CBC WITH DIFFERENTIAL/PLATELET
Abs Immature Granulocytes: 0.06 10*3/uL (ref 0.00–0.07)
Basophils Absolute: 0 10*3/uL (ref 0.0–0.1)
Basophils Relative: 0 %
Eosinophils Absolute: 0.1 10*3/uL (ref 0.0–0.5)
Eosinophils Relative: 1 %
HCT: 32.3 % — ABNORMAL LOW (ref 39.0–52.0)
Hemoglobin: 10.8 g/dL — ABNORMAL LOW (ref 13.0–17.0)
Immature Granulocytes: 1 %
Lymphocytes Relative: 7 %
Lymphs Abs: 0.5 10*3/uL — ABNORMAL LOW (ref 0.7–4.0)
MCH: 30.1 pg (ref 26.0–34.0)
MCHC: 33.4 g/dL (ref 30.0–36.0)
MCV: 90 fL (ref 80.0–100.0)
Monocytes Absolute: 0.7 10*3/uL (ref 0.1–1.0)
Monocytes Relative: 8 %
Neutro Abs: 6.5 10*3/uL (ref 1.7–7.7)
Neutrophils Relative %: 83 %
Platelets: 195 10*3/uL (ref 150–400)
RBC: 3.59 MIL/uL — ABNORMAL LOW (ref 4.22–5.81)
RDW: 13.9 % (ref 11.5–15.5)
WBC: 7.8 10*3/uL (ref 4.0–10.5)
nRBC: 0 % (ref 0.0–0.2)

## 2018-10-06 LAB — BASIC METABOLIC PANEL
Anion gap: 18 — ABNORMAL HIGH (ref 5–15)
BUN: 38 mg/dL — ABNORMAL HIGH (ref 6–20)
CO2: 25 mmol/L (ref 22–32)
Calcium: 8.3 mg/dL — ABNORMAL LOW (ref 8.9–10.3)
Chloride: 92 mmol/L — ABNORMAL LOW (ref 98–111)
Creatinine, Ser: 6.65 mg/dL — ABNORMAL HIGH (ref 0.61–1.24)
GFR calc Af Amer: 10 mL/min — ABNORMAL LOW (ref >60)
GFR calc non Af Amer: 8 mL/min — ABNORMAL LOW (ref >60)
Glucose, Bld: 246 mg/dL — ABNORMAL HIGH (ref 70–99)
Potassium: 4.5 mmol/L (ref 3.5–5.1)
Sodium: 135 mmol/L (ref 135–145)

## 2018-10-06 MED ORDER — CINACALCET HCL 30 MG PO TABS
60.0000 mg | ORAL_TABLET | Freq: Every day | ORAL | Status: DC
  Filled 2018-10-06: qty 2

## 2018-10-06 MED ORDER — OXYCODONE HCL 5 MG PO TABS: 5.0000 mg | ORAL_TABLET | ORAL | 0 refills | Status: DC | PRN

## 2018-10-06 MED ORDER — DOXERCALCIFEROL 4 MCG/2ML IV SOLN: 10.0000 ug | INTRAVENOUS | 0 refills | Status: DC

## 2018-10-06 MED ORDER — SEVELAMER CARBONATE 800 MG PO TABS
2400.0000 mg | ORAL_TABLET | Freq: Three times a day (TID) | ORAL | Status: DC
  Administered 2018-10-06: 2400 mg via ORAL
  Filled 2018-10-06: qty 3

## 2018-10-06 MED ORDER — PRO-STAT SUGAR FREE PO LIQD
30.0000 mL | Freq: Two times a day (BID) | ORAL | Status: DC
  Administered 2018-10-06: 11:00:00 30 mL via ORAL
  Filled 2018-10-06: qty 30

## 2018-10-06 MED ORDER — CINACALCET HCL 30 MG PO TABS: 60.0000 mg | ORAL_TABLET | Freq: Every day | ORAL | 0 refills | Status: DC

## 2018-10-06 MED ORDER — RENA-VITE PO TABS: 1.0000 | ORAL_TABLET | Freq: Every day | ORAL | Status: DC

## 2018-10-06 NOTE — Discharge Summary (Signed)
Physician Discharge Summary  Edgar Brewer LDJ:[id number] DOB: 1960-10-10 DOA: 10/02/2018  PCP: System, Pcp Not In  Admit date: 10/02/2018 Discharge date: 10/06/2018  Recommendations for Outpatient Follow-up:  1. Follow up with PCP in 7-10 days. 2. Follow up with nephrology as directed. 3. Follow up with general surgery as directed.  Follow-up Information    [facility], [place] Follow up on 10/23/2018.   Specialty: General Surgery Why: Your appointment is at 10:15 AM.  Be at the office 30 minutes early for check in.  Bring photo ID and insurance information.  Contact information: [address] [place] [id number] [phone]          Discharge Diagnoses: Principal diagnosis is #1 1. Choledocholithiasis s/p lap chole 2. ESRD on HD 3. DM II 4. HTN 5. Transaminitis 6. CHF with volume overload 7. Hepatic steatosis  Discharge Condition: Fair Disposition: Home  Diet recommendation: Heart Healthy/Carbohydrate controlled.  Filed Weights   10/02/18 1723 10/03/18 0230 10/04/18 0325  Weight: 95.3 kg 91.7 kg 90.3 kg    History of present illness:   Edgar Brewer is a 58 y.o. male with medical history significant of ESRD on HD, chronic combined systolic and diastolic congestive heart failure, pulmonary hypertension, type 2 diabetes, hypertension, chronic memory loss presenting to the hospital for evaluation of altered mental status and vomiting.  Patient is a poor historian and it was difficult to obtain a thorough history from him.  States for the past 4 to 5 days he has had some episodes of vomiting in the morning however has been able to eat food.  He has also been having intermittent abdominal pain for the past few days which he describes as epigastric and radiating to the right upper quadrant.  He is not sure if his abdominal pain is associated with eating.  Reports having loose stools.  Denies history of gallbladder problems.  Denies any fevers or chills.  Denies  any chest pain, shortness of breath, or cough.  No other complaints.  ED Course: Vital signs stable on arrival.  No leukocytosis.  Hemoglobin 11.4, at baseline.  Blood glucose 286.  Bicarb 23, anion gap 16.  LFTs elevated-AST 308, ALT 306, alk phos 762, and T bili 3.6.  Lipase mildly elevated at 74.  UA with negative nitrite, negative leukocyte esterase, 6-10 WBCs, and rare bacteria on microscopic examination.  Ammonia level pending.  SARS-CoV-2 test pending.  Chest x-ray showing no acute cardiopulmonary abnormality.  Abdominal x-ray showing borderline to mild gaseous dilatation of central small bowel though with scattered colon gas present, findings could be secondary to enteritis or ileus however partial or developing bowel obstruction could also produce this appearance.  CT abdomen pelvis showing evidence of cholelithiasis but no cholecystitis.  No focal hepatic abnormality or biliary ductal dilatation.  Also showing colonic diverticulosis but no active diverticulitis.  Stomach and small bowel decompressed, unremarkable.  No acute finding in the abdomen or pelvis.  Right upper quadrant ultrasound showing sludge and stones within the gallbladder with increased wall thickness but negative sonographic Murphy sign.  Findings nonspecific and thought to be secondary to acute on chronic cholecystitis, liver disease, or edema forming states.  Slight echogenic liver suggesting steatosis.  CT head showing no acute intracranial pathology. Patient received no medications in the ED.  Hospital Course:  The patient was admitted to a telemetry bed. He underwent laparoscopic cholecystectomy on 10/04/2018 after he was cleared for surgery. He tolerated the procedure well except for some pain  related tachypnea following the procedure. Nephrology was consulted and the patient underwent hemodialysis on 10/04/2018 and 10/06/2018. He has been cleared for discharge by nephrology and general surgery.  Today's assessment: S: The  patient is resting comfortably. No new complaints. O: Vitals:  Vitals:   10/06/18 1406 10/06/18 1520  BP: (!) 155/75   Pulse: 89   Resp: 20   Temp: 97.9 F (36.6 C)   SpO2: 97% 94%   Constitutional:   The patient was awake, alert, and oriented x 3. No acute distress. Respiratory:   No increased work of breathing.  No wheezes, rales, or rhonchi.  No tactile fremitus. Cardiovascular:   Regular rate and rhythm  No murmurs, ectopy, or gallups  No lateral PMI. No thrills. Abdomen:   Abdomen is soft, slightly tender, distended.  No hernias, masses, or organomegaly.  Normoactive bowel sounds. Musculoskeletal:   No cyanosis, clubbing, or edema Skin:   No rashes, lesions, ulcers  palpation of skin: no induration or nodules Neurologic:   CN 2-12 intact  Sensation all 4 extremities intact Psychiatric:   Mental status ? Mood, affect appropriate ? Orientation to person, place, time   judgment and insight appear intact    Discharge Instructions  Discharge Instructions    Activity as tolerated - No restrictions   Complete by: As directed    Call MD for:  difficulty breathing, headache or visual disturbances   Complete by: As directed    Call MD for:  extreme fatigue   Complete by: As directed    Call MD for:  temperature >100.4   Complete by: As directed    Diet - low sodium heart healthy   Complete by: As directed    Carbohydrate controlled.   Discharge instructions   Complete by: As directed    Follow up with PCP in 7-10 days. Follow up with nephrology as directed. Follow up with general surgery as directed.   Increase activity slowly   Complete by: As directed      Allergies as of 10/06/2018      Reactions   Codeine Hives, Swelling, Other (See Comments)   Swelling all over body       Medication List    STOP taking these medications   Fish Oil 1000 MG Cpdr   glipiZIDE 5 MG tablet Commonly known as: GLUCOTROL     TAKE these medications    atorvastatin 20 MG tablet Commonly known as: LIPITOR Take 20 mg by mouth daily.   Auryxia 1 GM 210 MG(Fe) tablet Generic drug: ferric citrate Take 420 mg by mouth 3 (three) times daily with meals.   b complex-vitamin c-folic acid 0.8 MG Tabs tablet Take 1 tablet by mouth daily.   cinacalcet 30 MG tablet Commonly known as: SENSIPAR Take 2 tablets (60 mg total) by mouth daily with supper. What changed:   how much to take  when to take this   doxercalciferol 4 MCG/2ML injection Commonly known as: HECTOROL Inject 5 mLs (10 mcg total) into the vein every Monday, Wednesday, and Friday with hemodialysis. Start taking on: October 08, 2018   gabapentin 300 MG capsule Commonly known as: NEURONTIN Take 300 mg by mouth 2 (two) times daily.   omeprazole 20 MG capsule Commonly known as: PRILOSEC Take 20 mg by mouth daily.   oxyCODONE 5 MG immediate release tablet Commonly known as: Oxy IR/ROXICODONE Take 1-2 tablets (5-10 mg total) by mouth every 4 (four) hours as needed for moderate pain.   sevelamer carbonate  800 MG tablet Commonly known as: RENVELA Take 1,600 mg by mouth 3 (three) times daily with meals.   STOOL SOFTENER LAXATIVE PO Take 1 tablet by mouth daily.   Tradjenta 5 MG Tabs tablet Generic drug: linagliptin Take 5 mg by mouth daily.      Allergies  Allergen Reactions   Codeine Hives, Swelling and Other (See Comments)    Swelling all over body     The results of significant diagnostics from this hospitalization (including imaging, microbiology, ancillary and laboratory) are listed below for reference.    Significant Diagnostic Studies: Ct Head Wo Contrast  Result Date: 10/02/2018 CLINICAL DATA:  Altered level of consciousness EXAM: CT HEAD WITHOUT CONTRAST TECHNIQUE: Contiguous axial images were obtained from the base of the skull through the vertex without intravenous contrast. COMPARISON:  None. FINDINGS: Brain: No evidence of acute territorial  infarction, hemorrhage, hydrocephalus,extra-axial collection or mass lesion/mass effect. Normal gray-white differentiation. Ventricles are normal in size and contour. Vascular: No hyperdense vessel or unexpected calcification. Skull: The skull is intact. No fracture or focal lesion identified. Sinuses/Orbits: The visualized paranasal sinuses and mastoid air cells are clear. The orbits and globes intact. Other: None IMPRESSION: No acute intracranial pathology. Electronically Signed   By: Prudencio Pair M.D.   On: 10/02/2018 19:55   Mr Brain Wo Contrast  Result Date: 09/27/2018  [facility] [address], [place], [place] [address] [phone] NEUROIMAGING REPORT STUDY DATE: 09/27/2018 PATIENT NAME: Reginal Wojcicki DOB: 1961-01-13 MRN: [id number] EXAM: MRI Brain without contrast ORDERING CLINICIAN: Kathrynn Ducking, MD CLINICAL HISTORY: 58 year old man with memory loss COMPARISON FILMS: None TECHNIQUE: MRI of the brain without contrast was obtained utilizing 5 mm axial slices with T1, T2, T2 flair, SWI and diffusion weighted views.  T1 sagittal and T2 coronal views were obtained. CONTRAST: none IMAGING SITE: [facility], [place], [place] FINDINGS: On sagittal images, the spinal cord is imaged caudally to C3 and is normal in caliber.   The contents of the posterior fossa are of normal size and position.   The pituitary gland and optic chiasm appear normal.    Brain volume appears normal for age.   The ventricles are normal in size for age and without distortion.  There are no abnormal extra-axial collections of fluid.  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  Elsewhere in the hemispheres, there are a few scattered T2/flair hyperintense foci consistent with very minimal chronic microvascular ischemic change.  The cerebellum and brainstem appears normal.   The deep gray matter appears normal.  Diffusion weighted images are normal.  Susceptibility  weighted images are normal.  The VIIth/VIIIth nerve complex appears normal.  The mastoid air cells appear normal.  There have been bilateral lens replacements.  The orbits are otherwise normal.  Very minimal mucoperiosteal thickening inferiorly in the maxillary sinuses.  The other paranasal sinuses appear normal.  Flow voids are identified within the major intracerebral arteries.     This MRI of the brain without contrast shows the following: 1.   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 2.    Minimal chronic microvascular ischemic changes in the hemispheres. 3.    Brain volume is normal for age. 4.    No acute findings. INTERPRETING PHYSICIAN: Richard A. Felecia Shelling, MD, PhD, FAAN Certified in  Neuroimaging by [employer]   Ct Abdomen Pelvis W Contrast  Result Date: 10/02/2018 CLINICAL DATA:  Abdominal distention, nausea, vomiting. Question gallbladder disease. EXAM: CT ABDOMEN AND PELVIS WITH CONTRAST TECHNIQUE: Multidetector CT imaging of the abdomen and pelvis was performed using the standard protocol following bolus administration of intravenous contrast. CONTRAST:  191m OMNIPAQUE IOHEXOL 300 MG/ML  SOLN COMPARISON:  None. FINDINGS: Lower chest: Cardiomegaly.  No confluent opacities or effusions. Hepatobiliary: Multiple gallstones layering within the gallbladder. No focal hepatic abnormality or biliary ductal dilatation. No visible gallbladder wall thickening. Pancreas: No focal abnormality or ductal dilatation. Spleen: No focal abnormality.  Normal size. Adrenals/Urinary Tract: No adrenal abnormality. No focal renal abnormality. No stones or hydronephrosis. Urinary bladder is unremarkable. Stomach/Bowel: Colonic diverticulosis. No active diverticulitis. Stomach and small bowel decompressed, unremarkable. Vascular/Lymphatic: No evidence of aneurysm or adenopathy. Aortic atherosclerosis. Reproductive: No visible focal abnormality. Other: No free fluid or free air.  Musculoskeletal: No acute bony abnormality. IMPRESSION: Colonic diverticulosis crash that cholelithiasis. No CT evidence for cholecystitis. Colonic diverticulosis.  No diverticulitis. Aortic atherosclerosis. No acute findings in the abdomen or pelvis. Electronically Signed   By: KRolm BaptiseM.D.   On: 10/02/2018 19:57   Mr Abdomen Mrcp Wo Contrast  Result Date: 10/04/2018 CLINICAL DATA:  Jaundice, abdominal pain. End stage renal disease. EXAM: MRI ABDOMEN WITHOUT CONTRAST  (INCLUDING MRCP) TECHNIQUE: Multiplanar multisequence MR imaging of the abdomen was performed. Heavily T2-weighted images of the biliary and pancreatic ducts were obtained, and three-dimensional MRCP images were rendered by post processing. COMPARISON:  Multiple exams, including CT and ultrasound exams from 09/08/120 FINDINGS: Lower chest: Mild cardiomegaly. Hepatobiliary: Multiple gallstones are present in the gallbladder, the largest approximately 1.1 cm in diameter. Mild gallbladder wall thickening is present no biliary dilatation is identified. Subject to the limitations of motion artifact, no filling defect is identified in the biliary tree to suggest choledocholithiasis. There is reduced hepatic and splenic signal on inphase images compared to out of phase images, raising the possibility of mild hemochromatosis. No significant focal liver lesion on today's noncontrast exam. Pancreas:  Pancreas divisum.  Otherwise unremarkable. Spleen: Dropout of signal in the spleen on inphase images raising the possibility of hemosiderosis. The spleen measures 13.4 by 6.0 by 14.1 cm (volume = 590 cm^3). Adrenals/Urinary Tract: Mild bilateral renal atrophy. Adrenal glands unremarkable. Stomach/Bowel: Small diverticulum of the transverse duodenum without signs of inflammation. Widespread colonic diverticulosis. Vascular/Lymphatic:  Aortoiliac atherosclerotic vascular disease. Other:  No supplemental non-categorized findings. Musculoskeletal: Unremarkable  IMPRESSION: 1. Cholelithiasis and gallbladder wall thickening. Correlate clinically in assessing for acute cholecystitis. 2. No biliary dilatation or appreciable choledocholithiasis. 3. There is some dropout of signal in both the liver and spleen on inphase images, potentially from hemosiderosis or hemochromatosis. 4. Pancreas divisum. 5. Colonic diverticulosis. 6.  Aortic Atherosclerosis (ICD10-I70.0). Electronically Signed   By: WVan ClinesM.D.   On: 10/04/2018 08:08   Mr 3d Recon At Scanner  Result Date: 10/04/2018 CLINICAL DATA:  Jaundice, abdominal pain. End stage renal disease. EXAM: MRI ABDOMEN WITHOUT CONTRAST  (INCLUDING MRCP) TECHNIQUE: Multiplanar multisequence MR imaging of the abdomen was performed. Heavily T2-weighted images of the biliary and pancreatic ducts were obtained, and three-dimensional MRCP images were rendered by post processing. COMPARISON:  Multiple exams, including CT and ultrasound exams from 09/08/120 FINDINGS: Lower chest: Mild cardiomegaly. Hepatobiliary: Multiple gallstones are present in the gallbladder, the largest approximately 1.1 cm in diameter. Mild gallbladder wall thickening is present no biliary dilatation is identified. Subject to the limitations of motion artifact, no filling defect is identified in the biliary tree to suggest choledocholithiasis. There is  reduced hepatic and splenic signal on inphase images compared to out of phase images, raising the possibility of mild hemochromatosis. No significant focal liver lesion on today's noncontrast exam. Pancreas:  Pancreas divisum.  Otherwise unremarkable. Spleen: Dropout of signal in the spleen on inphase images raising the possibility of hemosiderosis. The spleen measures 13.4 by 6.0 by 14.1 cm (volume = 590 cm^3). Adrenals/Urinary Tract: Mild bilateral renal atrophy. Adrenal glands unremarkable. Stomach/Bowel: Small diverticulum of the transverse duodenum without signs of inflammation. Widespread colonic  diverticulosis. Vascular/Lymphatic:  Aortoiliac atherosclerotic vascular disease. Other:  No supplemental non-categorized findings. Musculoskeletal: Unremarkable IMPRESSION: 1. Cholelithiasis and gallbladder wall thickening. Correlate clinically in assessing for acute cholecystitis. 2. No biliary dilatation or appreciable choledocholithiasis. 3. There is some dropout of signal in both the liver and spleen on inphase images, potentially from hemosiderosis or hemochromatosis. 4. Pancreas divisum. 5. Colonic diverticulosis. 6.  Aortic Atherosclerosis (ICD10-I70.0). Electronically Signed   By: Van Clines M.D.   On: 10/04/2018 08:08   Dg Abdomen Acute W/chest  Result Date: 10/02/2018 CLINICAL DATA: Nausea and vomiting EXAM: DG ABDOMEN ACUTE W/ 1V CHEST COMPARISON:  Chest x-ray 07/06/2016 FINDINGS: Single-view chest demonstrates no focal airspace disease or effusion. Cardiomediastinal silhouette within normal limits. No pneumothorax. No free air beneath the diaphragm. Borderline to mild gaseous distention of central small bowel loops with scattered colon gas present. IMPRESSION: 1. No radiographic evidence for acute cardiopulmonary abnormality. 2. Borderline to mild gaseous dilatation of central small bowel though with scattered colon gas present, findings could be secondary to enteritis or ileus, however partial or developing bowel obstruction could also produce this appearance. Electronically Signed   By: Donavan Foil M.D.   On: 10/02/2018 18:58   US Abdomen Limited Ruq  Result Date: 10/02/2018 CLINICAL DATA:  Elevated LFTs EXAM: ULTRASOUND ABDOMEN LIMITED RIGHT UPPER QUADRANT COMPARISON:  CT 10/02/2018 FINDINGS: Gallbladder: Sludge in multiple small stones in the gallbladder measuring up to 1.4 cm. Increased wall thickness at 5.7 mm. Negative sonographic Murphy. Common bile duct: Diameter: 2.9 mm Liver: Increased hepatic echogenicity without focal hepatic abnormality. Portal vein is patent on color  Doppler imaging with normal direction of blood flow towards the liver. Other: None. IMPRESSION: 1. Sludge and stones within the gallbladder with increased wall thickness but negative sonographic Murphy. Findings are nonspecific and could be secondary to acute or chronic cholecystitis, liver disease, or edema forming states. If acute gallbladder disease remains a concern, further evaluation with hepatobiliary nuclear medicine imaging could be obtained. 2. Slightly echogenic liver suggesting steatosis Electronically Signed   By: Donavan Foil M.D.   On: 10/02/2018 21:35    Microbiology: Recent Results (from the past 240 hour(s))  SARS Coronavirus 2 [facility] order, Performed in [facility] hospital lab) Nasopharyngeal Nasopharyngeal Swab     Status: None   Collection Time: 10/02/18  9:48 PM   Specimen: Nasopharyngeal Swab  Result Value Ref Range Status   SARS Coronavirus 2 NEGATIVE NEGATIVE Final    Comment: (NOTE) If result is NEGATIVE SARS-CoV-2 target nucleic acids are NOT DETECTED. The SARS-CoV-2 RNA is generally detectable in upper and lower  respiratory specimens during the acute phase of infection. The lowest  concentration of SARS-CoV-2 viral copies this assay can detect is 250  copies / mL. A negative result does not preclude SARS-CoV-2 infection  and should not be used as the sole basis for treatment or other  patient management decisions.  A negative result may occur with  improper specimen collection / handling, submission of  specimen other  than nasopharyngeal swab, presence of viral mutation(s) within the  areas targeted by this assay, and inadequate number of viral copies  (<250 copies / mL). A negative result must be combined with clinical  observations, patient history, and epidemiological information. If result is POSITIVE SARS-CoV-2 target nucleic acids are DETECTED. The SARS-CoV-2 RNA is generally detectable in upper and lower  respiratory specimens dur ing the acute  phase of infection.  Positive  results are indicative of active infection with SARS-CoV-2.  Clinical  correlation with patient history and other diagnostic information is  necessary to determine patient infection status.  Positive results do  not rule out bacterial infection or co-infection with other viruses. If result is PRESUMPTIVE POSTIVE SARS-CoV-2 nucleic acids MAY BE PRESENT.   A presumptive positive result was obtained on the submitted specimen  and confirmed on repeat testing.  While 2019 novel coronavirus  (SARS-CoV-2) nucleic acids may be present in the submitted sample  additional confirmatory testing may be necessary for epidemiological  and / or clinical management purposes  to differentiate between  SARS-CoV-2 and other Sarbecovirus currently known to infect humans.  If clinically indicated additional testing with an alternate test  methodology [phone]) is advised. The SARS-CoV-2 RNA is generally  detectable in upper and lower respiratory sp ecimens during the acute  phase of infection. The expected result is Negative. Fact Sheet for Patients:  StrictlyIdeas.no Fact Sheet for Healthcare Providers: BankingDealers.co.za This test is not yet approved or cleared by the [place] FDA and has been authorized for detection and/or diagnosis of SARS-CoV-2 by FDA under an Emergency Use Authorization (EUA).  This EUA will remain in effect (meaning this test can be used) for the duration of the COVID-19 declaration under Section 564(b)(1) of the Act, 21 U.S.C. section 360bbb-3(b)(1), unless the authorization is terminated or revoked sooner. Performed at [facility], [place] [address] [place], [place] [address]   MRSA PCR Screening     Status: None   Collection Time: 10/03/18 12:03 PM   Specimen: Nasopharyngeal  Result Value Ref Range Status   MRSA by PCR NEGATIVE NEGATIVE Final    Comment:        The GeneXpert MRSA Assay  (FDA approved for NASAL specimens only), is one component of a comprehensive MRSA colonization surveillance program. It is not intended to diagnose MRSA infection nor to guide or monitor treatment for MRSA infections. Performed at [facility], [place] [address], [place], [place] [address]      Labs: Basic Metabolic Panel: Recent Labs  Lab 10/02/18 1726 10/03/18 0530 10/04/18 0823 10/05/18 1223 10/06/18 0327  NA 134* 136 136 134* 135  K 5.1 4.4 4.5 5.6* 4.5  CL 95* 95* 96* 92* 92*  CO2 23 26 22 22 25   GLUCOSE 286* 109* 96 290* 246*  BUN 45* 51* 28* 59* 38*  CREATININE 7.91* 8.71* 6.97* 10.15* 6.65*  CALCIUM 8.3* 8.5* 8.6* 7.7* 8.3*  PHOS  --   --   --  7.9*  --    Liver Function Tests: Recent Labs  Lab 10/02/18 1726 10/03/18 0530 10/04/18 0823 10/05/18 1223  AST 308* 216* 117*  --   ALT 306* 265* 188*  --   ALKPHOS 762* 736* 693*  --   BILITOT 3.6* 3.7* 3.0*  --   PROT 7.7 7.1 7.0  --   ALBUMIN 3.6 3.2* 3.3* 3.0*   Recent Labs  Lab 10/02/18 1822  LIPASE 74*   Recent Labs  Lab 10/02/18 2201  AMMONIA 42*   CBC: Recent Labs  Lab 10/02/18 1726 10/04/18 0823 10/05/18 1223 10/06/18 0327  WBC 8.7 4.7 8.8 7.8  NEUTROABS 7.7  --   --  6.5  HGB 11.4* 12.0* 11.2* 10.8*  HCT 34.8* 36.2* 33.9* 32.3*  MCV 90.4 90.5 91.1 90.0  PLT 192 202 218 195   Cardiac Enzymes: No results for input(s): CKTOTAL, CKMB, CKMBINDEX, TROPONINI in the last 168 hours. BNP: BNP (last 3 results) No results for input(s): BNP in the last 8760 hours.  ProBNP (last 3 results) No results for input(s): PROBNP in the last 8760 hours.  CBG: Recent Labs  Lab 10/02/18 1719 10/03/18 0155 10/04/18 1143 10/04/18 1319 10/05/18 1630  GLUCAP 278* 104* 97 139* 173*    Principal Problem:   Choledocholithiasis Active Problems:   Diabetes mellitus type II, non insulin dependent (HCC)   Accelerated hypertension   ESRD (end stage renal disease) on dialysis [facility])   Time  coordinating discharge: 38 minutes.  Signed:        Theora Vankirk, DO [facility]  10/06/2018, 5:28 PM

## 2018-10-06 NOTE — Progress Notes (Signed)
[facility] Progress Note   Dialysis Orders: MWF at [facility] 4.25 hours 400/800 EDW 92 2K 2 Ca left upper AVF heparin 2600 venofer 50 /week last 9/2 no ESA hectorol 10 Recent labs: hgb 11 38% sat iPTH 600 - only Mircera was 30 7/31  Assessment/Plan: 1. Choledocholithiasis: LFTS - improved 9/10  S/p lap chole 9/10.  2. ESRD: MWF  - no heparin d/t recent surgery. 3. Hypertension/volume: BP controlled, below prior EDW. Net UF 1 L Friday- not weighed - 4. Anemia: Hgb 12 > 10.8  repeat today and give ESA prn. Of note -possibility of  hemosiderosis on liver MRI - will d/c IV iron as outpatient. 5. Metabolic bone disease: Ca ok, P elevated Resume home binder/VDRA.- previously on Aurixya due to #4 -and renvela/sensipar 60 - d/c Aurixya - resume renvela ^ to 3 ac given ^ P and resume sensipar -may need to change to fosrenol  6. Nutrition: Alb down, Will added pro-stat supplements - added multivits - change to renal carb mod diet 7. DM: Per primary. 8.  Disp - ok per renal for d/c - encouraged mobility!!  Myriam Jacobson, PA-C [place] 10/06/2018,8:57 AM  LOS: 4 days   Subjective:   No problems with HD yesterday. Tells me someone is going to come and help with get up and walk. Still some abdominal pain. Having BMs  Objective Vitals:   10/05/18 2028 10/06/18 0031 10/06/18 0445 10/06/18 0833  BP:  139/68 138/72 138/69  Pulse:  87 88 82  Resp: 20 18 (!) 24 20  Temp: 98.8 F (37.1 C) 98.6 F (37 C) 98.7 F (37.1 C) 98.4 F (36.9 C)  TempSrc:  Oral Oral Oral  SpO2: 93% 92% 94% 97%  Weight:      Height:       Physical Exam General: NAD Heart: RRR Lungs: no rales Abdomen: soft incisions dry some tenderness Extremities: no LE edema Dialysis Access: left AVF   Additional Objective Labs: Basic Metabolic Panel: Recent Labs  Lab 10/04/18 0823 10/05/18 1223 10/06/18 0327  NA 136 134* 135  K 4.5 5.6* 4.5  CL 96* 92* 92*   CO2 22 22 25   GLUCOSE 96 290* 246*  BUN 28* 59* 38*  CREATININE 6.97* 10.15* 6.65*  CALCIUM 8.6* 7.7* 8.3*  PHOS  --  7.9*  --    Liver Function Tests: Recent Labs  Lab 10/02/18 1726 10/03/18 0530 10/04/18 0823 10/05/18 1223  AST 308* 216* 117*  --   ALT 306* 265* 188*  --   ALKPHOS 762* 736* 693*  --   BILITOT 3.6* 3.7* 3.0*  --   PROT 7.7 7.1 7.0  --   ALBUMIN 3.6 3.2* 3.3* 3.0*   Recent Labs  Lab 10/02/18 1822  LIPASE 74*   CBC: Recent Labs  Lab 10/02/18 1726 10/04/18 0823 10/05/18 1223 10/06/18 0327  WBC 8.7 4.7 8.8 7.8  NEUTROABS 7.7  --   --  6.5  HGB 11.4* 12.0* 11.2* 10.8*  HCT 34.8* 36.2* 33.9* 32.3*  MCV 90.4 90.5 91.1 90.0  PLT 192 202 218 195   Blood Culture No results found for: SDES, SPECREQUEST, CULT, REPTSTATUS  Cardiac Enzymes: No results for input(s): CKTOTAL, CKMB, CKMBINDEX, TROPONINI in the last 168 hours. CBG: Recent Labs  Lab 10/02/18 1719 10/03/18 0155 10/04/18 1143 10/04/18 1319 10/05/18 1630  GLUCAP 278* 104* 97 139* 173*   Iron Studies: No results for input(s): IRON, TIBC, TRANSFERRIN, FERRITIN in the last  72 hours. Lab Results  Component Value Date   INR 1.08 01/13/2015   Studies/Results: No results found. Medications:  . Chlorhexidine Gluconate Cloth  6 each Topical Q0600  . doxercalciferol  10 mcg Intravenous Q M,W,F-HD  . heparin injection (subcutaneous)  5,000 Units Subcutaneous Q8H

## 2018-10-06 NOTE — Progress Notes (Signed)
Patient received discharge instructions and verbalized understanding. Pt is currently being wheeled to transportation home and is in stable condition.

## 2018-10-13 NOTE — Anesthesia Postprocedure Evaluation (Signed)
Anesthesia Post Note  Patient: Edgar Brewer  Procedure(s) Performed: LAPAROSCOPIC CHOLECYSTECTOMY (N/A Abdomen)     Anesthesia Post Evaluation    post op eval by Dr Kalman Shan, see PACU nursing note

## 2018-10-15 DIAGNOSIS — E441 Mild protein-calorie malnutrition: Secondary | ICD-10-CM | POA: Insufficient documentation

## 2018-10-18 ENCOUNTER — Inpatient Hospital Stay (HOSPITAL_COMMUNITY)
Admission: EM | Admit: 2018-10-18 | Discharge: 2018-10-22 | DRG: 699 | Disposition: A | Discharge status: Home / Self Care | Payer: Medicare Other | Attending: Internal Medicine | Admitting: Internal Medicine

## 2018-10-18 ENCOUNTER — Other Ambulatory Visit: Payer: Self-pay

## 2018-10-18 ENCOUNTER — Encounter (HOSPITAL_COMMUNITY): Payer: Self-pay | Admitting: Emergency Medicine

## 2018-10-18 ENCOUNTER — Emergency Department (HOSPITAL_COMMUNITY): Payer: Medicare Other

## 2018-10-18 DIAGNOSIS — R7989 Other specified abnormal findings of blood chemistry: Secondary | ICD-10-CM | POA: Diagnosis present

## 2018-10-18 DIAGNOSIS — E669 Obesity, unspecified: Secondary | ICD-10-CM | POA: Diagnosis present

## 2018-10-18 DIAGNOSIS — Z87891 Personal history of nicotine dependence: Secondary | ICD-10-CM

## 2018-10-18 DIAGNOSIS — Z885 Allergy status to narcotic agent status: Secondary | ICD-10-CM

## 2018-10-18 DIAGNOSIS — Z87442 Personal history of urinary calculi: Secondary | ICD-10-CM

## 2018-10-18 DIAGNOSIS — R509 Fever, unspecified: Secondary | ICD-10-CM | POA: Diagnosis present

## 2018-10-18 DIAGNOSIS — N2581 Secondary hyperparathyroidism of renal origin: Secondary | ICD-10-CM | POA: Diagnosis present

## 2018-10-18 DIAGNOSIS — E11319 Type 2 diabetes mellitus with unspecified diabetic retinopathy without macular edema: Secondary | ICD-10-CM | POA: Diagnosis present

## 2018-10-18 DIAGNOSIS — Z992 Dependence on renal dialysis: Secondary | ICD-10-CM

## 2018-10-18 DIAGNOSIS — E119 Type 2 diabetes mellitus without complications: Secondary | ICD-10-CM | POA: Diagnosis not present

## 2018-10-18 DIAGNOSIS — Z9049 Acquired absence of other specified parts of digestive tract: Secondary | ICD-10-CM

## 2018-10-18 DIAGNOSIS — R112 Nausea with vomiting, unspecified: Secondary | ICD-10-CM | POA: Diagnosis not present

## 2018-10-18 DIAGNOSIS — Z7984 Long term (current) use of oral hypoglycemic drugs: Secondary | ICD-10-CM

## 2018-10-18 DIAGNOSIS — E118 Type 2 diabetes mellitus with unspecified complications: Secondary | ICD-10-CM

## 2018-10-18 DIAGNOSIS — R188 Other ascites: Secondary | ICD-10-CM | POA: Diagnosis present

## 2018-10-18 DIAGNOSIS — Z79899 Other long term (current) drug therapy: Secondary | ICD-10-CM

## 2018-10-18 DIAGNOSIS — R748 Abnormal levels of other serum enzymes: Secondary | ICD-10-CM | POA: Diagnosis present

## 2018-10-18 DIAGNOSIS — Z800 Family history of malignant neoplasm of digestive organs: Secondary | ICD-10-CM

## 2018-10-18 DIAGNOSIS — I5032 Chronic diastolic (congestive) heart failure: Secondary | ICD-10-CM | POA: Diagnosis present

## 2018-10-18 DIAGNOSIS — H547 Unspecified visual loss: Secondary | ICD-10-CM | POA: Diagnosis present

## 2018-10-18 DIAGNOSIS — E1122 Type 2 diabetes mellitus with diabetic chronic kidney disease: Secondary | ICD-10-CM | POA: Diagnosis not present

## 2018-10-18 DIAGNOSIS — E1140 Type 2 diabetes mellitus with diabetic neuropathy, unspecified: Secondary | ICD-10-CM | POA: Diagnosis present

## 2018-10-18 DIAGNOSIS — K5730 Diverticulosis of large intestine without perforation or abscess without bleeding: Secondary | ICD-10-CM | POA: Diagnosis present

## 2018-10-18 DIAGNOSIS — R413 Other amnesia: Secondary | ICD-10-CM | POA: Diagnosis present

## 2018-10-18 DIAGNOSIS — K219 Gastro-esophageal reflux disease without esophagitis: Secondary | ICD-10-CM | POA: Diagnosis present

## 2018-10-18 DIAGNOSIS — D649 Anemia, unspecified: Secondary | ICD-10-CM | POA: Diagnosis present

## 2018-10-18 DIAGNOSIS — N186 End stage renal disease: Secondary | ICD-10-CM | POA: Diagnosis not present

## 2018-10-18 DIAGNOSIS — E8889 Other specified metabolic disorders: Secondary | ICD-10-CM | POA: Diagnosis present

## 2018-10-18 DIAGNOSIS — I132 Hypertensive heart and chronic kidney disease with heart failure and with stage 5 chronic kidney disease, or end stage renal disease: Secondary | ICD-10-CM | POA: Diagnosis present

## 2018-10-18 DIAGNOSIS — Z20828 Contact with and (suspected) exposure to other viral communicable diseases: Secondary | ICD-10-CM | POA: Diagnosis present

## 2018-10-18 DIAGNOSIS — I5042 Chronic combined systolic (congestive) and diastolic (congestive) heart failure: Secondary | ICD-10-CM | POA: Diagnosis present

## 2018-10-18 DIAGNOSIS — I272 Pulmonary hypertension, unspecified: Secondary | ICD-10-CM | POA: Diagnosis present

## 2018-10-18 DIAGNOSIS — R945 Abnormal results of liver function studies: Secondary | ICD-10-CM | POA: Diagnosis not present

## 2018-10-18 DIAGNOSIS — M1990 Unspecified osteoarthritis, unspecified site: Secondary | ICD-10-CM | POA: Diagnosis present

## 2018-10-18 DIAGNOSIS — Z6830 Body mass index (BMI) 30.0-30.9, adult: Secondary | ICD-10-CM

## 2018-10-18 HISTORY — DX: Type 2 diabetes mellitus with unspecified complications: E11.8

## 2018-10-18 LAB — CBC
HCT: 36.5 % — ABNORMAL LOW (ref 39.0–52.0)
Hemoglobin: 12.5 g/dL — ABNORMAL LOW (ref 13.0–17.0)
MCH: 29.9 pg (ref 26.0–34.0)
MCHC: 34.2 g/dL (ref 30.0–36.0)
MCV: 87.3 fL (ref 80.0–100.0)
Platelets: 204 10*3/uL (ref 150–400)
RBC: 4.18 MIL/uL — ABNORMAL LOW (ref 4.22–5.81)
RDW: 13.4 % (ref 11.5–15.5)
WBC: 8.8 10*3/uL (ref 4.0–10.5)
nRBC: 0 % (ref 0.0–0.2)

## 2018-10-18 LAB — COMPREHENSIVE METABOLIC PANEL
ALT: 579 U/L — ABNORMAL HIGH (ref 0–44)
AST: 499 U/L — ABNORMAL HIGH (ref 15–41)
Albumin: 3 g/dL — ABNORMAL LOW (ref 3.5–5.0)
Alkaline Phosphatase: 1404 U/L — ABNORMAL HIGH (ref 38–126)
Anion gap: 14 (ref 5–15)
BUN: 25 mg/dL — ABNORMAL HIGH (ref 6–20)
CO2: 29 mmol/L (ref 22–32)
Calcium: 9 mg/dL (ref 8.9–10.3)
Chloride: 91 mmol/L — ABNORMAL LOW (ref 98–111)
Creatinine, Ser: 4.31 mg/dL — ABNORMAL HIGH (ref 0.61–1.24)
GFR calc Af Amer: 16 mL/min — ABNORMAL LOW (ref >60)
GFR calc non Af Amer: 14 mL/min — ABNORMAL LOW (ref >60)
Glucose, Bld: 331 mg/dL — ABNORMAL HIGH (ref 70–99)
Potassium: 4.9 mmol/L (ref 3.5–5.1)
Sodium: 134 mmol/L — ABNORMAL LOW (ref 135–145)
Total Bilirubin: 5.4 mg/dL — ABNORMAL HIGH (ref 0.3–1.2)
Total Protein: 6.6 g/dL (ref 6.5–8.1)

## 2018-10-18 LAB — LIPASE, BLOOD: Lipase: 57 U/L — ABNORMAL HIGH (ref 11–51)

## 2018-10-18 MED ORDER — LACTATED RINGERS IV BOLUS
500.0000 mL | Freq: Once | INTRAVENOUS | Status: AC
  Administered 2018-10-18: 500 mL via INTRAVENOUS

## 2018-10-18 MED ORDER — SODIUM CHLORIDE 0.9% FLUSH: 3.0000 mL | INTRAVENOUS | Status: DC | PRN

## 2018-10-18 MED ORDER — PIPERACILLIN-TAZOBACTAM 3.375 G IVPB 30 MIN
3.3750 g | Freq: Once | INTRAVENOUS | Status: AC
  Administered 2018-10-18: 3.375 g via INTRAVENOUS
  Filled 2018-10-18: qty 50

## 2018-10-18 MED ORDER — ONDANSETRON HCL 4 MG PO TABS: 4.0000 mg | ORAL_TABLET | Freq: Four times a day (QID) | ORAL | Status: DC | PRN

## 2018-10-18 MED ORDER — PIPERACILLIN-TAZOBACTAM 3.375 G IVPB
3.3750 g | Freq: Two times a day (BID) | INTRAVENOUS | Status: DC
  Administered 2018-10-19 – 2018-10-21 (×5): 3.375 g via INTRAVENOUS
  Filled 2018-10-18 (×5): qty 50

## 2018-10-18 MED ORDER — IOHEXOL 300 MG/ML  SOLN
100.0000 mL | Freq: Once | INTRAMUSCULAR | Status: AC | PRN
  Administered 2018-10-18: 20:00:00 100 mL via INTRAVENOUS

## 2018-10-18 MED ORDER — SODIUM CHLORIDE 0.9% FLUSH
3.0000 mL | Freq: Two times a day (BID) | INTRAVENOUS | Status: DC
  Administered 2018-10-19 – 2018-10-21 (×3): 3 mL via INTRAVENOUS

## 2018-10-18 MED ORDER — LABETALOL HCL 5 MG/ML IV SOLN: 10.0000 mg | INTRAVENOUS | Status: DC | PRN

## 2018-10-18 MED ORDER — FENTANYL CITRATE (PF) 100 MCG/2ML IJ SOLN: 25.0000 ug | INTRAMUSCULAR | Status: DC | PRN

## 2018-10-18 MED ORDER — ONDANSETRON HCL 4 MG/2ML IJ SOLN: 4.0000 mg | Freq: Four times a day (QID) | INTRAMUSCULAR | Status: DC | PRN

## 2018-10-18 MED ORDER — INSULIN ASPART 100 UNIT/ML ~~LOC~~ SOLN
0.0000 [IU] | SUBCUTANEOUS | Status: DC
  Administered 2018-10-19: 3 [IU] via SUBCUTANEOUS
  Administered 2018-10-19 (×2): 2 [IU] via SUBCUTANEOUS
  Administered 2018-10-19: 01:00:00 3 [IU] via SUBCUTANEOUS
  Administered 2018-10-20: 2 [IU] via SUBCUTANEOUS
  Administered 2018-10-20: 5 [IU] via SUBCUTANEOUS
  Administered 2018-10-20: 2 [IU] via SUBCUTANEOUS
  Administered 2018-10-20: 3 [IU] via SUBCUTANEOUS
  Administered 2018-10-20 – 2018-10-21 (×8): 2 [IU] via SUBCUTANEOUS
  Administered 2018-10-22: 3 [IU] via SUBCUTANEOUS
  Administered 2018-10-22: 2 [IU] via SUBCUTANEOUS
  Administered 2018-10-22: 3 [IU] via SUBCUTANEOUS

## 2018-10-18 MED ORDER — SODIUM CHLORIDE 0.9% FLUSH: 3.0000 mL | Freq: Once | INTRAVENOUS | Status: DC

## 2018-10-18 MED ORDER — ONDANSETRON HCL 4 MG/2ML IJ SOLN
4.0000 mg | Freq: Once | INTRAMUSCULAR | Status: AC
  Administered 2018-10-18: 4 mg via INTRAVENOUS
  Filled 2018-10-18: qty 2

## 2018-10-18 MED ORDER — SODIUM CHLORIDE 0.9 % IV SOLN: 250.0000 mL | INTRAVENOUS | Status: DC | PRN

## 2018-10-18 NOTE — ED Triage Notes (Signed)
Pt arrives to ER with c/o of being "sick" pt states he has been vomiting since waking up this morning. Pt denies any pain just feels sick.

## 2018-10-18 NOTE — ED Provider Notes (Signed)
Emergency Department Provider Note   I have reviewed the triage vital signs and the nursing notes.   HISTORY  Chief Complaint Emesis   HPI Edgar Brewer is a 58 y.o. male patient with multiple medical problems documented below with a recent cholecystectomy secondary to choledocholithiasis.  Patient apparently been doing fine for last 2 or 3 days he had progressively worsening nausea and today had 6-7 episodes of nonbloody nonbilious vomiting.  Patient states he has mild abdominal pain but nothing compared to when he hospital previously.  Had a temperature at home of 101.0.  No urinary symptoms.  No other GI symptoms.  Does not notice any worsening distention or jaundice.   No other associated or modifying symptoms.    Past Medical History:  Diagnosis Date  . Acute on chronic combined systolic and diastolic CHF, NYHA class 1 ([place]) 01/13/2015  . Arthritis   . Chronic kidney disease    MWF Edgar Brewer.  . Diabetes mellitus without complication ([name])    Type II  . GERD (gastroesophageal reflux disease)    at times  . Head injury with loss of consciousness ([name])   . History of kidney stones   . Hypertension   . Neuropathy   . Pulmonary hypertension ([facility])    PA peak pressure 41 mmHg 03/04/16 echo  . Varicose veins   . Varicose veins of lower extremities with complications 51/76/1607    Patient Active Problem List   Diagnosis Date Noted  . Abnormal LFTs 10/18/2018  . Intraabdominal fluid collection 10/18/2018  . Nausea & vomiting 10/18/2018  . Choledocholithiasis 10/03/2018  . ESRD (end stage renal disease) on dialysis ([name]) 10/03/2018  . AKI (acute kidney injury) ([name]) 07/06/2016  . Chest pain at rest 07/06/2016  . Chest pain 07/06/2016  . CKD (chronic kidney disease), stage IV ([name]) 01/14/2015  . Diabetes mellitus type II, non insulin dependent ([place]) 01/14/2015  . Obesity 01/14/2015  . Accelerated hypertension 01/14/2015  . Hypokalemia 01/14/2015  . Anemia of chronic  kidney failure 01/14/2015  . Chronic diastolic CHF (congestive heart failure) ([name]) 01/13/2015  . Hematuria 01/13/2015  . Varicose veins of lower extremities with complications 37/10/6267  . Chronic venous insufficiency 10/22/2014    Past Surgical History:  Procedure Laterality Date  . AV FISTULA PLACEMENT Left 06/01/2016   Procedure: ARTERIOVENOUS (AV) FISTULA CREATION;  Surgeon: [name], MD;  Location: [facility];  Service: Vascular;  Laterality: Left;  . AV FISTULA PLACEMENT Left 08/10/2017   Procedure: BRACHIOCEPHALIC ARTERIOVENOUS (AV) FISTULA CREATION LEFT UPPER EXTREMITY;  Surgeon: [name], MD;  Location: [facility];  Service: Vascular;  Laterality: Left;  . CHOLECYSTECTOMY N/A 10/04/2018   Procedure: LAPAROSCOPIC CHOLECYSTECTOMY;  Surgeon: Kinsinger, Arta Bruce, MD;  Location: [facility];  Service: General;  Laterality: N/A;  . COLONOSCOPY W/ POLYPECTOMY    . ENDOVENOUS ABLATION SAPHENOUS VEIN W/ LASER Left 07-23-2015   endovenous laser ablation left greater saphenous vein by [name]  . ENDOVENOUS ABLATION SAPHENOUS VEIN W/ LASER Right 10/15/2015   EVLA right greater saphenous vein by [name]    Current Outpatient Rx  . Order #: [id number] Class: Historical Med  . Order #: [id number] Class: Historical Med  . Order #: [id number] Class: Normal  . Order #: [id number] Class: Historical Med  . Order #: [id number] Class: No Print  . Order #: [id number] Class: Historical Med  . Order #: [id number] Class: Historical Med  . Order #: [id number] Class: Historical Med  . Order #: [id number] Class: Historical Med  .  Order #: [id number] Class: Historical Med  . Order #: [id number] Class: Print  . Order #: [id number] Class: Historical Med  . Order #: [id number] Class: Historical Med    Allergies Codeine  Family History  Problem Relation Age of Onset  . Cancer Father     Social History Social History   Tobacco Use  . Smoking status: Former Smoker    Packs/day: 1.00    Years: 45.00     Pack years: 45.00    Types: Cigarettes    Quit date: 01/13/2015    Years since quitting: 3.7  . Smokeless tobacco: Never Used  Substance Use Topics  . Alcohol use: Yes    Alcohol/week: 1.0 standard drinks    Types: 1 Glasses of wine per week    Comment: rare occassion  . Drug use: No    Review of Systems  All other systems negative except as documented in the HPI. All pertinent positives and negatives as reviewed in the HPI. ____________________________________________   PHYSICAL EXAM:  VITAL SIGNS: ED Triage Vitals  Enc Vitals Group     BP 10/18/18 1429 (!) 142/63     Pulse Rate 10/18/18 1429 98     Resp 10/18/18 1429 16     Temp 10/18/18 1429 99.7 F (37.6 C)     Temp Source 10/18/18 1429 Oral     SpO2 10/18/18 1429 94 %    Constitutional: Alert and oriented. Well appearing and in no acute distress. Eyes: Conjunctivae are normal. PERRL. EOMI. Head: Atraumatic. Nose: No congestion/rhinnorhea. Mouth/Throat: Mucous membranes are moist.  Oropharynx non-erythematous. Neck: No stridor.  No meningeal signs.   Cardiovascular: Normal rate, regular rhythm. Good peripheral circulation. Grossly normal heart sounds.   Respiratory: Normal respiratory effort.  No retractions. Lungs CTAB. Gastrointestinal: Soft and mildly tender. No distention.  Musculoskeletal: No lower extremity tenderness nor edema. No gross deformities of extremities. Neurologic:  Normal speech and language. No gross focal neurologic deficits are appreciated.  Skin:  Skin is warm, dry and intact. No rash noted.   ____________________________________________   LABS (all labs ordered are listed, but only abnormal results are displayed)  Labs Reviewed  LIPASE, BLOOD - Abnormal; Notable for the following components:      Result Value   Lipase 57 (*)    All other components within normal limits  COMPREHENSIVE METABOLIC PANEL - Abnormal; Notable for the following components:   Sodium 134 (*)    Chloride 91  (*)    Glucose, Bld 331 (*)    BUN 25 (*)    Creatinine, Ser 4.31 (*)    Albumin 3.0 (*)    AST 499 (*)    ALT 579 (*)    Alkaline Phosphatase 1,404 (*)    Total Bilirubin 5.4 (*)    GFR calc non Af Amer 14 (*)    GFR calc Af Amer 16 (*)    All other components within normal limits  CBC - Abnormal; Notable for the following components:   RBC 4.18 (*)    Hemoglobin 12.5 (*)    HCT 36.5 (*)    All other components within normal limits  SARS CORONAVIRUS 2 (TAT 6-24 HRS)  CULTURE, BLOOD (ROUTINE X 2)  CULTURE, BLOOD (ROUTINE X 2)  URINALYSIS, ROUTINE W REFLEX MICROSCOPIC  HEMOGLOBIN A1C  COMPREHENSIVE METABOLIC PANEL  PROTIME-INR  CBC  LIPASE, BLOOD   ____________________________________________  EKG   EKG Interpretation  Date/Time:    Ventricular Rate:    PR Interval:    QRS Duration:  QT Interval:    QTC Calculation:   R Axis:     Text Interpretation:         ____________________________________________  RADIOLOGY  Ct Abdomen Pelvis W Contrast  Result Date: 10/18/2018 CLINICAL DATA:  58 year old male with nausea and vomiting. Concern for retained stone. EXAM: CT ABDOMEN AND PELVIS WITH CONTRAST TECHNIQUE: Multidetector CT imaging of the abdomen and pelvis was performed using the standard protocol following bolus administration of intravenous contrast. CONTRAST:  156mL OMNIPAQUE IOHEXOL 300 MG/ML  SOLN COMPARISON:  CT of the abdomen pelvis dated 10/02/2018 FINDINGS: Lower chest: Bibasilar linear atelectasis/scarring. No intra-abdominal free air or free fluid. Hepatobiliary: Probable mild fatty infiltration of the liver. No intrahepatic biliary ductal dilatation. Cholecystectomy. There is a 3 x 2 cm fluid pocket along the inferior surface of the liver in the cholecystectomy. This may represent a seroma. A biloma or an abscess are not excluded. Clinical correlation is recommended. No retained calcified stone noted the central CBD. Pancreas: Unremarkable. No pancreatic  ductal dilatation or surrounding inflammatory changes. Spleen: Mild splenomegaly measuring up to 15 cm length. Adrenals/Urinary Tract: The adrenal glands are unremarkable. There is no hydronephrosis on either side. There is symmetric enhancement and excretion of contrast by both kidneys. The visualized ureters and urinary bladder appear unremarkable. Stomach/Bowel: There is sigmoid diverticulosis and scattered colonic diverticula. Mild haziness adjacent to the hepatic flexure diverticula likely related to stranding from cholecystectomy and less likely related to diverticulitis. There is no bowel obstruction. The appendix is normal. Vascular/Lymphatic: Moderate aortoiliac atherosclerotic disease. IVC is unremarkable. No portal venous gas. There is no adenopathy. Reproductive: The prostate and seminal vesicles are grossly unremarkable. No pelvic mass. Other: None Musculoskeletal: Degenerative changes of the spine. L5-S1 disc desiccation and vacuum phenomena. No acute osseous pathology. IMPRESSION: 1. A 3 x 2 cm fluid pocket along the inferior surface of the liver in the cholecystectomy. This may represent a seroma. A biloma or an abscess are not excluded. Clinical correlation is recommended. No retained calcified stone noted in the central CBD. 2. Colonic diverticulosis. Minimal stranding adjacent to the hepatic flexure, likely secondary to cholecystectomy and less likely acute cholecystitis. No bowel obstruction. Normal appendix. Aortic Atherosclerosis (ICD10-I70.0). Electronically Signed   By: Anner Crete M.D.   On: 10/18/2018 20:40    ____________________________________________  INITIAL IMPRESSION / ASSESSMENT AND PLAN / ED COURSE  Here with worsening LFTs, bilirubin compared to discharge.  Concern for possible retained stone versus cholangitis versus localized abscess.  We will start antibiotics, repeat CT scan.  Likely need to be admitted but need to figure out if there is any emergent intervention  needed.  Discussed with surgery who recommends HIDA, GI consult and medicine admission. General surgery (Dr. Dema Severin) will see patient and help manage as well.   Pertinent labs & imaging results that were available during my care of the patient were reviewed by me and considered in my medical decision making (see chart for details).  A medical screening exam was performed and I feel the patient has had an appropriate workup for their chief complaint at this time and likelihood of emergent condition existing is low. They have been counseled on decision, discharge, follow up and which symptoms necessitate immediate return to the emergency department. They or their family verbally stated understanding and agreement with plan and discharged in stable condition.   ____________________________________________  FINAL CLINICAL IMPRESSION(S) / ED DIAGNOSES  Final diagnoses:  Nausea and vomiting, intractability of vomiting not specified, unspecified vomiting type  MEDICATIONS GIVEN DURING THIS VISIT:  Medications  sodium chloride flush (NS) 0.9 % injection 3 mL (has no administration in time range)  insulin aspart (novoLOG) injection 0-9 Units (has no administration in time range)  sodium chloride flush (NS) 0.9 % injection 3 mL (has no administration in time range)  sodium chloride flush (NS) 0.9 % injection 3 mL (has no administration in time range)  0.9 %  sodium chloride infusion (has no administration in time range)  ondansetron (ZOFRAN) tablet 4 mg (has no administration in time range)    Or  ondansetron (ZOFRAN) injection 4 mg (has no administration in time range)  fentaNYL (SUBLIMAZE) injection 25-50 mcg (has no administration in time range)  labetalol (NORMODYNE) injection 10 mg (has no administration in time range)  piperacillin-tazobactam (ZOSYN) IVPB 3.375 g (has no administration in time range)  ondansetron (ZOFRAN) injection 4 mg (4 mg Intravenous Given 10/18/18 2040)  lactated ringers  bolus 500 mL (0 mLs Intravenous Stopped 10/18/18 2127)  piperacillin-tazobactam (ZOSYN) IVPB 3.375 g (0 g Intravenous Stopped 10/18/18 2219)  iohexol (OMNIPAQUE) 300 MG/ML solution 100 mL (100 mLs Intravenous Contrast Given 10/18/18 2009)     NEW OUTPATIENT MEDICATIONS STARTED DURING THIS VISIT:  New Prescriptions   No medications on file    Note:  This note was prepared with assistance of Dragon voice recognition software. Occasional wrong-word or sound-a-like substitutions may have occurred due to the inherent limitations of voice recognition software.   Oshua Mcconaha, Corene Cornea, MD 10/19/18 0002

## 2018-10-18 NOTE — Progress Notes (Signed)
Subjective Edgar Brewer is well known to our service. He is a 39yoM with hx of HTN, ESRD, DM whom had presented earlier this month with findings concerning for cholecystitis but also elevated LFTs. His MRCP did not demonstrate common duct stones. His LFTs were down trending. He subsequently underwent laparoscopic cholecystectomy by my partner Dr. Kieth Brightly 10/04/2018. He was found to have moderate gallbladder inflammation and distention. He recovered from this and was discharged. He has done reasonably well until the last 24 hrs when he developed pain similar to his abdominal pain that led to cholecystectomy. Describes as sharp/crampy RUQ and persistent with n/v. He denies any diarrhea or constipation. He reports temp at home of 101.5.  Objective: Vital signs in last 24 hours: Temp:  [99.7 F (37.6 C)-99.8 F (37.7 C)] 99.8 F (37.7 C) (09/24 1916) Pulse Rate:  [93-98] 94 (09/24 2130) Resp:  [16-24] 22 (09/24 2130) BP: (142-169)/(63-92) 169/92 (09/24 2130) SpO2:  [94 %-99 %] 94 % (09/24 2130)    Intake/Output from previous day: No intake/output data recorded. Intake/Output this shift: No intake/output data recorded.  Gen: NAD, comfortable CV: RRR Pulm: Normal work of breathing Abd: Soft, minimal tenderness anywhere; not significantly distened. Surgical incisions c/d without erythema or drainage Ext: SCDs in place  Lab Results: CBC  Recent Labs    10/18/18 1440  WBC 8.8  HGB 12.5*  HCT 36.5*  PLT 204   BMET Recent Labs    10/18/18 1440  NA 134*  K 4.9  CL 91*  CO2 29  GLUCOSE 331*  BUN 25*  CREATININE 4.31*  CALCIUM 9.0   PT/INR No results for input(s): LABPROT, INR in the last 72 hours. ABG No results for input(s): PHART, HCO3 in the last 72 hours.  Invalid input(s): PCO2, PO2  Studies/Results:  Anti-infectives: Anti-infectives (From admission, onward)   Start     Dose/Rate Route Frequency Ordered Stop   10/18/18 1945  piperacillin-tazobactam (ZOSYN) IVPB 3.375  g     3.375 g 100 mL/hr over 30 Minutes Intravenous  Once 10/18/18 1942 10/18/18 2156     CT A/P 10/18/2018: 1. A 3 x 2 cm fluid pocket along the inferior surface of the liver in the cholecystectomy. This may represent a seroma. A biloma or an abscess are not excluded. Clinical correlation is recommended. No retained calcified stone noted in the central CBD. 2. Colonic diverticulosis. Minimal stranding adjacent to the hepatic flexure, likely secondary to cholecystectomy and less likely acute cholecystitis. No bowel obstruction. Normal appendix.  Assessment/Plan: Patient Active Problem List   Diagnosis Date Noted  . Abnormal LFTs 10/18/2018  . Intraabdominal fluid collection 10/18/2018  . Nausea & vomiting 10/18/2018  . Choledocholithiasis 10/03/2018  . ESRD (end stage renal disease) on dialysis ([place]) 10/03/2018  . AKI (acute kidney injury) ([place]) 07/06/2016  . Chest pain at rest 07/06/2016  . Chest pain 07/06/2016  . CKD (chronic kidney disease), stage IV ([place]) 01/14/2015  . Diabetes mellitus type II, non insulin dependent ([place]) 01/14/2015  . Obesity 01/14/2015  . Accelerated hypertension 01/14/2015  . Hypokalemia 01/14/2015  . Anemia of chronic kidney failure 01/14/2015  . Chronic diastolic CHF (congestive heart failure) ([place]) 01/13/2015  . Hematuria 01/13/2015  . Varicose veins of lower extremities with complications 16/10/9602  . 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 Dema Severin, M.D. [facility]

## 2018-10-18 NOTE — H&P (Signed)
History and Physical    Edgar Brewer DGL:[id number] DOB: August 09, 1960 DOA: 10/18/2018  PCP: System, Pcp Not In   Patient coming from: Home   Chief Complaint: N/V, fever  HPI: Edgar Brewer is a 58 y.o. male with medical history significant for ESRD, type 2 diabetes mellitus, chronic combined systolic and diastolic CHF, hypertension, and recent admission with laparoscopic cholecystectomy on 10/04/2018, discharged on 10/06/2018, and now returning to the ED with nausea and vomiting.  Patient reported that he has been doing well following the surgery and until this morning when he developed nausea with nonbloody vomiting and has had persistence of the symptoms throughout the day.  He has some mild abdominal discomfort, but not nearly as severe as with his prior presentation.  He reports having a fever to 97 F today at home.  He reports completing dialysis on 10/17/2018.  Denies cough or shortness of breath.  ED Course: Upon arrival to the ED, patient is found to have a temp of 37.7 C, saturating mid 90s on room air, and with stable blood pressure.  Chemistry panel is notable for alkaline phosphatase 1404, AST 499, ALT 579, and total bilirubin 5.4, all increased from time of recent discharge.  CBC is notable for slight normocytic anemia.  CT of the abdomen and pelvis is concerning for a 3 x 2 cm fluid collection along the inferior surface of the liver that could reflect a seroma, bili [place], or abscess.  Surgery was consulted by the ED physician and recommended medical admission with HIDA scan and GI consultation.  Gastroenterology agreed with this plan and nephrology was also consulted by the ED physician and indicate that they plan to see the patient in the morning for maintenance dialysis.  Review of Systems:  All other systems reviewed and apart from HPI, are negative.  Past Medical History:  Diagnosis Date  . Acute on chronic combined systolic and diastolic CHF, NYHA class 1 ([place]) 01/13/2015  .  Arthritis   . Chronic kidney disease    MWF [name].  . Diabetes mellitus without complication ([name])    Type II  . GERD (gastroesophageal reflux disease)    at times  . Head injury with loss of consciousness ([place])   . History of kidney stones   . Hypertension   . Neuropathy   . Pulmonary hypertension (HCC)    PA peak pressure 41 mmHg 03/04/16 echo  . Varicose veins   . Varicose veins of lower extremities with complications 51/88/4166    Past Surgical History:  Procedure Laterality Date  . AV FISTULA PLACEMENT Left 06/01/2016   Procedure: ARTERIOVENOUS (AV) FISTULA CREATION;  Surgeon: [name], MD;  Location: [facility];  Service: Vascular;  Laterality: Left;  . AV FISTULA PLACEMENT Left 08/10/2017   Procedure: BRACHIOCEPHALIC ARTERIOVENOUS (AV) FISTULA CREATION LEFT UPPER EXTREMITY;  Surgeon: [name], MD;  Location: [facility];  Service: Vascular;  Laterality: Left;  . CHOLECYSTECTOMY N/A 10/04/2018   Procedure: LAPAROSCOPIC CHOLECYSTECTOMY;  Surgeon: Kinsinger, Arta Bruce, MD;  Location: [facility];  Service: General;  Laterality: N/A;  . COLONOSCOPY W/ POLYPECTOMY    . ENDOVENOUS ABLATION SAPHENOUS VEIN W/ LASER Left 07-23-2015   endovenous laser ablation left greater saphenous vein by [name]  . ENDOVENOUS ABLATION SAPHENOUS VEIN W/ LASER Right 10/15/2015   EVLA right greater saphenous vein by [name]     reports that he quit smoking about 3 years ago. His smoking use included cigarettes. He has a 45.00  pack-year smoking history. He has never used smokeless tobacco. He reports current alcohol use of about 1.0 standard drinks of alcohol per week. He reports that he does not use drugs.  Allergies  Allergen Reactions  . Codeine Hives, Swelling and Other (See Comments)    Swelling all over body     Family History  Problem Relation Age of Onset  . Cancer Father      Prior to Admission medications   Medication Sig Start Date End Date Taking? Authorizing  Provider  atorvastatin (LIPITOR) 20 MG tablet Take 20 mg by mouth daily.  11/02/17   [provider]  b complex-vitamin c-folic acid (NEPHRO-VITE) 0.8 MG TABS tablet Take 1 tablet by mouth daily.    [provider]  cinacalcet (SENSIPAR) 30 MG tablet Take 2 tablets (60 mg total) by mouth daily with supper. 10/06/18   Swayze, Ava, DO  Docusate Sodium (STOOL SOFTENER LAXATIVE PO) Take 1 tablet by mouth daily.     [provider]  doxercalciferol (HECTOROL) 4 MCG/2ML injection Inject 5 mLs (10 mcg total) into the vein every Monday, Wednesday, and Friday with hemodialysis. 10/08/18   Swayze, Ava, DO  ferric citrate (AURYXIA) 1 GM 210 MG(Fe) tablet Take 420 mg by mouth 3 (three) times daily with meals.    [provider]  gabapentin (NEURONTIN) 300 MG capsule Take 300 mg by mouth 2 (two) times daily.    [provider]  linagliptin (TRADJENTA) 5 MG TABS tablet Take 5 mg by mouth daily.    [provider]  omeprazole (PRILOSEC) 20 MG capsule Take 20 mg by mouth daily. 11/25/17   [provider]  oxyCODONE (OXY IR/ROXICODONE) 5 MG immediate release tablet Take 1-2 tablets (5-10 mg total) by mouth every 4 (four) hours as needed for moderate pain. 10/06/18   Swayze, Ava, DO  sevelamer carbonate (RENVELA) 800 MG tablet Take 1,600 mg by mouth 3 (three) times daily with meals.    [provider]    Physical Exam: Vitals:   10/18/18 1916 10/18/18 1930 10/18/18 2000 10/18/18 2130  BP:  (!) 154/74 (!) 165/91 (!) 169/92  Pulse:  93 95 94  Resp:  17 (!) 24 (!) 22  Temp: 99.8 F (37.7 C)     TempSrc: Oral     SpO2:  99% 94% 94%    Constitutional: NAD, calm  Eyes: PERTLA, lids and conjunctivae normal ENMT: Mucous membranes are moist. Posterior pharynx clear of any exudate or lesions.   Neck: normal, supple, no masses, no thyromegaly Respiratory: no wheezing, no crackles. Normal respiratory effort. No accessory muscle use.   Cardiovascular: S1 & S2 heard, regular rate and rhythm. No extremity edema. Abdomen: No distension, soft, tender in RUQ and epigastrium without rebound pain or guarding. Bowel sounds active.  Musculoskeletal: no clubbing / cyanosis. No joint deformity upper and lower extremities.    Skin: no significant rashes, lesions, ulcers. Warm, dry, well-perfused. Neurologic: CN 2-12 grossly intact. Sensation intact. Moving all extremities.  Psychiatric: Sleeping, easily woken and oriented to person, place, and situation. Calm, cooperative.   Labs on Admission: I have personally reviewed following labs and imaging studies  CBC: Recent Labs  Lab 10/18/18 1440  WBC 8.8  HGB 12.5*  HCT 36.5*  MCV 87.3  PLT 546   Basic Metabolic Panel: Recent Labs  Lab 10/18/18 1440  NA 134*  K 4.9  CL 91*  CO2 29  GLUCOSE 331*  BUN 25*  CREATININE 4.31*  CALCIUM 9.0  GFR: CrCl cannot be calculated (Unknown ideal weight.). Liver Function Tests: Recent Labs  Lab 10/18/18 1440  AST 499*  ALT 579*  ALKPHOS 1,404*  BILITOT 5.4*  PROT 6.6  ALBUMIN 3.0*   Recent Labs  Lab 10/18/18 1440  LIPASE 57*   No results for input(s): AMMONIA in the last 168 hours. Coagulation Profile: No results for input(s): INR, PROTIME in the last 168 hours. Cardiac Enzymes: No results for input(s): CKTOTAL, CKMB, CKMBINDEX, TROPONINI in the last 168 hours. BNP (last 3 results) No results for input(s): PROBNP in the last 8760 hours. HbA1C: No results for input(s): HGBA1C in the last 72 hours. CBG: No results for input(s): GLUCAP in the last 168 hours. Lipid Profile: No results for input(s): CHOL, HDL, LDLCALC, TRIG, CHOLHDL, LDLDIRECT in the last 72 hours. Thyroid Function Tests: No results for input(s): TSH, T4TOTAL, FREET4, T3FREE, THYROIDAB in the last 72 hours. Anemia Panel: No results for input(s): VITAMINB12, FOLATE, FERRITIN, TIBC, IRON, RETICCTPCT in the last 72 hours. Urine analysis:    Component  Value Date/Time   COLORURINE AMBER (A) 10/02/2018 2117   APPEARANCEUR HAZY (A) 10/02/2018 2117   LABSPEC 1.022 10/02/2018 2117   PHURINE 5.0 10/02/2018 2117   GLUCOSEU 150 (A) 10/02/2018 2117   HGBUR MODERATE (A) 10/02/2018 2117   BILIRUBINUR NEGATIVE 10/02/2018 2117   KETONESUR NEGATIVE 10/02/2018 2117   PROTEINUR >=300 (A) 10/02/2018 2117   NITRITE NEGATIVE 10/02/2018 2117   LEUKOCYTESUR NEGATIVE 10/02/2018 2117   Sepsis Labs: _0 (procalcitonin:4,lacticidven:4) )No results found for this or any previous visit (from the past 240 hour(s)).   Radiological Exams on Admission: Ct Abdomen Pelvis W Contrast  Result Date: 10/18/2018 CLINICAL DATA:  58 year old male with nausea and vomiting. Concern for retained stone. EXAM: CT ABDOMEN AND PELVIS WITH CONTRAST TECHNIQUE: Multidetector CT imaging of the abdomen and pelvis was performed using the standard protocol following bolus administration of intravenous contrast. CONTRAST:  168m OMNIPAQUE IOHEXOL 300 MG/ML  SOLN COMPARISON:  CT of the abdomen pelvis dated 10/02/2018 FINDINGS: Lower chest: Bibasilar linear atelectasis/scarring. No intra-abdominal free air or free fluid. Hepatobiliary: Probable mild fatty infiltration of the liver. No intrahepatic biliary ductal dilatation. Cholecystectomy. There is a 3 x 2 cm fluid pocket along the inferior surface of the liver in the cholecystectomy. This may represent a seroma. A biloma or an abscess are not excluded. Clinical correlation is recommended. No retained calcified stone noted the central CBD. Pancreas: Unremarkable. No pancreatic ductal dilatation or surrounding inflammatory changes. Spleen: Mild splenomegaly measuring up to 15 cm length. Adrenals/Urinary Tract: The adrenal glands are unremarkable. There is no hydronephrosis on either side. There is symmetric enhancement and excretion of contrast by both kidneys. The visualized ureters and urinary bladder appear unremarkable. Stomach/Bowel:  There is sigmoid diverticulosis and scattered colonic diverticula. Mild haziness adjacent to the hepatic flexure diverticula likely related to stranding from cholecystectomy and less likely related to diverticulitis. There is no bowel obstruction. The appendix is normal. Vascular/Lymphatic: Moderate aortoiliac atherosclerotic disease. IVC is unremarkable. No portal venous gas. There is no adenopathy. Reproductive: The prostate and seminal vesicles are grossly unremarkable. No pelvic mass. Other: None Musculoskeletal: Degenerative changes of the spine. L5-S1 disc desiccation and vacuum phenomena. No acute osseous pathology. IMPRESSION: 1. A 3 x 2 cm fluid pocket along the inferior surface of the liver in the cholecystectomy. This may represent a seroma. A biloma or an abscess are not excluded. Clinical correlation is recommended. No retained calcified stone noted in the central CBD. 2.  Colonic diverticulosis. Minimal stranding adjacent to the hepatic flexure, likely secondary to cholecystectomy and less likely acute cholecystitis. No bowel obstruction. Normal appendix. Aortic Atherosclerosis (ICD10-I70.0). Electronically Signed   By: Anner Crete M.D.   On: 10/18/2018 20:40    EKG: Not performed.   Assessment/Plan   1. Nausea/vomiting; infrahepatic fluid collection; elevated LFT's  - Pt is s/p lap chole on 9/10, now presents with one day of N/V and fever, found to have increase in alk phos, transaminases, and t bili from time of recent d/c with fluid collection along inferior aspect of liver on CT   - Surgery and GI were consulted by ED physician and recommended next step is HIDA scan  - In light of reported fever and as abscess is on DDx, antibiotics started in ED  - Continue bowel-rest, Zosyn, symptom-control, check HIDA scan, repeat LFT's in am    2. ESRD  - Patient reports completing HD on 10/17/18   - There is no indication for emergent HD; ED physician has discussed with nephrology for  maintenance HD while in hospital  - SLIV, renally-dose medications    3. Type II DM  - No recent A1c on file, serum glucose is 331 in ED  - Managed with Tradjenta at home, held on admission  - Use SSI with Novolog while in hospital     PPE: Mask, face shield  DVT prophylaxis: SCD's  Code Status: Full  Family Communication: Discussed with patient  Consults called: GI, surgery, and nephrology consulted by ED physician  Admission status: observation     Vianne Bulls, MD [facility] Pager [phone]  If 7PM-7AM, please contact night-coverage [email or web address] Password [email or web address]  10/18/2018, 10:00 PM

## 2018-10-18 NOTE — ED Notes (Signed)
Called to recheck vitals no answer

## 2018-10-18 NOTE — Progress Notes (Signed)
Pharmacy Antibiotic Note  Edgar Brewer is a 58 y.o. male admitted on 10/18/2018 with nausea and vomiting.  Pharmacy has been consulted for zosyn dosing for intra-abdominal infection. Pt with Tmax 99.8 and WBC is WNL. Pt with history of ESRD on HD.   Plan: Zosyn 3.375gm IV Q12H (4 hr inf) F/u renal fxn, C&S, clinical status  *Pharmacy will sign off as no further dose adjustments are anticipated. Thank you for the consult!    Temp (24hrs), Avg:99.8 F (37.7 C), Min:99.7 F (37.6 C), Max:99.8 F (37.7 C)  Recent Labs  Lab 10/18/18 1440  WBC 8.8  CREATININE 4.31*    CrCl cannot be calculated (Unknown ideal weight.).    Dovber Ernest, Rande Lawman 10/18/2018 10:09 PM

## 2018-10-19 ENCOUNTER — Encounter (HOSPITAL_COMMUNITY): Payer: Self-pay

## 2018-10-19 ENCOUNTER — Inpatient Hospital Stay (HOSPITAL_COMMUNITY): Payer: Medicare Other

## 2018-10-19 ENCOUNTER — Other Ambulatory Visit: Payer: Self-pay

## 2018-10-19 DIAGNOSIS — E119 Type 2 diabetes mellitus without complications: Secondary | ICD-10-CM | POA: Diagnosis not present

## 2018-10-19 DIAGNOSIS — R413 Other amnesia: Secondary | ICD-10-CM | POA: Diagnosis present

## 2018-10-19 DIAGNOSIS — R17 Unspecified jaundice: Secondary | ICD-10-CM | POA: Diagnosis not present

## 2018-10-19 DIAGNOSIS — Z7984 Long term (current) use of oral hypoglycemic drugs: Secondary | ICD-10-CM | POA: Diagnosis not present

## 2018-10-19 DIAGNOSIS — Z87891 Personal history of nicotine dependence: Secondary | ICD-10-CM | POA: Diagnosis not present

## 2018-10-19 DIAGNOSIS — R945 Abnormal results of liver function studies: Secondary | ICD-10-CM

## 2018-10-19 DIAGNOSIS — R112 Nausea with vomiting, unspecified: Secondary | ICD-10-CM | POA: Diagnosis present

## 2018-10-19 DIAGNOSIS — Z6830 Body mass index (BMI) 30.0-30.9, adult: Secondary | ICD-10-CM | POA: Diagnosis not present

## 2018-10-19 DIAGNOSIS — K5730 Diverticulosis of large intestine without perforation or abscess without bleeding: Secondary | ICD-10-CM | POA: Diagnosis present

## 2018-10-19 DIAGNOSIS — Z20828 Contact with and (suspected) exposure to other viral communicable diseases: Secondary | ICD-10-CM | POA: Diagnosis present

## 2018-10-19 DIAGNOSIS — R188 Other ascites: Secondary | ICD-10-CM | POA: Diagnosis present

## 2018-10-19 DIAGNOSIS — Z87442 Personal history of urinary calculi: Secondary | ICD-10-CM | POA: Diagnosis not present

## 2018-10-19 DIAGNOSIS — E669 Obesity, unspecified: Secondary | ICD-10-CM | POA: Diagnosis present

## 2018-10-19 DIAGNOSIS — Z800 Family history of malignant neoplasm of digestive organs: Secondary | ICD-10-CM | POA: Diagnosis not present

## 2018-10-19 DIAGNOSIS — E1122 Type 2 diabetes mellitus with diabetic chronic kidney disease: Secondary | ICD-10-CM | POA: Diagnosis present

## 2018-10-19 DIAGNOSIS — N186 End stage renal disease: Secondary | ICD-10-CM | POA: Diagnosis present

## 2018-10-19 DIAGNOSIS — I5042 Chronic combined systolic (congestive) and diastolic (congestive) heart failure: Secondary | ICD-10-CM | POA: Diagnosis present

## 2018-10-19 DIAGNOSIS — E1140 Type 2 diabetes mellitus with diabetic neuropathy, unspecified: Secondary | ICD-10-CM | POA: Diagnosis present

## 2018-10-19 DIAGNOSIS — H547 Unspecified visual loss: Secondary | ICD-10-CM | POA: Diagnosis present

## 2018-10-19 DIAGNOSIS — Z992 Dependence on renal dialysis: Secondary | ICD-10-CM | POA: Diagnosis not present

## 2018-10-19 DIAGNOSIS — I132 Hypertensive heart and chronic kidney disease with heart failure and with stage 5 chronic kidney disease, or end stage renal disease: Secondary | ICD-10-CM | POA: Diagnosis present

## 2018-10-19 DIAGNOSIS — Z79899 Other long term (current) drug therapy: Secondary | ICD-10-CM | POA: Diagnosis not present

## 2018-10-19 DIAGNOSIS — I272 Pulmonary hypertension, unspecified: Secondary | ICD-10-CM | POA: Diagnosis present

## 2018-10-19 DIAGNOSIS — E11319 Type 2 diabetes mellitus with unspecified diabetic retinopathy without macular edema: Secondary | ICD-10-CM | POA: Diagnosis present

## 2018-10-19 DIAGNOSIS — Z885 Allergy status to narcotic agent status: Secondary | ICD-10-CM | POA: Diagnosis not present

## 2018-10-19 DIAGNOSIS — R509 Fever, unspecified: Secondary | ICD-10-CM | POA: Diagnosis present

## 2018-10-19 DIAGNOSIS — K219 Gastro-esophageal reflux disease without esophagitis: Secondary | ICD-10-CM | POA: Diagnosis present

## 2018-10-19 DIAGNOSIS — M1990 Unspecified osteoarthritis, unspecified site: Secondary | ICD-10-CM | POA: Diagnosis present

## 2018-10-19 LAB — URINALYSIS, ROUTINE W REFLEX MICROSCOPIC
Bacteria, UA: NONE SEEN
Bilirubin Urine: NEGATIVE
Glucose, UA: >=500 mg/dL — AB
Ketones, ur: NEGATIVE mg/dL
Leukocytes,Ua: NEGATIVE
Nitrite: NEGATIVE
Protein, ur: >=300 mg/dL — AB
Specific Gravity, Urine: 1.028 (ref 1.005–1.030)
pH: 6 (ref 5.0–8.0)

## 2018-10-19 LAB — COMPREHENSIVE METABOLIC PANEL
ALT: 592 U/L — ABNORMAL HIGH (ref 0–44)
AST: 499 U/L — ABNORMAL HIGH (ref 15–41)
Albumin: 2.6 g/dL — ABNORMAL LOW (ref 3.5–5.0)
Alkaline Phosphatase: 1214 U/L — ABNORMAL HIGH (ref 38–126)
Anion gap: 11 (ref 5–15)
BUN: 28 mg/dL — ABNORMAL HIGH (ref 6–20)
CO2: 32 mmol/L (ref 22–32)
Calcium: 8.9 mg/dL (ref 8.9–10.3)
Chloride: 94 mmol/L — ABNORMAL LOW (ref 98–111)
Creatinine, Ser: 4.87 mg/dL — ABNORMAL HIGH (ref 0.61–1.24)
GFR calc Af Amer: 14 mL/min — ABNORMAL LOW (ref >60)
GFR calc non Af Amer: 12 mL/min — ABNORMAL LOW (ref >60)
Glucose, Bld: 192 mg/dL — ABNORMAL HIGH (ref 70–99)
Potassium: 3.6 mmol/L (ref 3.5–5.1)
Sodium: 137 mmol/L (ref 135–145)
Total Bilirubin: 4 mg/dL — ABNORMAL HIGH (ref 0.3–1.2)
Total Protein: 6 g/dL — ABNORMAL LOW (ref 6.5–8.1)

## 2018-10-19 LAB — GLUCOSE, CAPILLARY
Glucose-Capillary: 126 mg/dL — ABNORMAL HIGH (ref 70–99)
Glucose-Capillary: 167 mg/dL — ABNORMAL HIGH (ref 70–99)
Glucose-Capillary: 177 mg/dL — ABNORMAL HIGH (ref 70–99)
Glucose-Capillary: 182 mg/dL — ABNORMAL HIGH (ref 70–99)
Glucose-Capillary: 198 mg/dL — ABNORMAL HIGH (ref 70–99)
Glucose-Capillary: 206 mg/dL — ABNORMAL HIGH (ref 70–99)

## 2018-10-19 LAB — SARS CORONAVIRUS 2 (TAT 6-24 HRS): SARS Coronavirus 2: NEGATIVE

## 2018-10-19 LAB — CBC
HCT: 29 % — ABNORMAL LOW (ref 39.0–52.0)
Hemoglobin: 9.7 g/dL — ABNORMAL LOW (ref 13.0–17.0)
MCH: 30.1 pg (ref 26.0–34.0)
MCHC: 33.4 g/dL (ref 30.0–36.0)
MCV: 90.1 fL (ref 80.0–100.0)
Platelets: 200 10*3/uL (ref 150–400)
RBC: 3.22 MIL/uL — ABNORMAL LOW (ref 4.22–5.81)
RDW: 13.8 % (ref 11.5–15.5)
WBC: 4.5 10*3/uL (ref 4.0–10.5)
nRBC: 0 % (ref 0.0–0.2)

## 2018-10-19 LAB — HEMOGLOBIN A1C
Hgb A1c MFr Bld: 8.4 % — ABNORMAL HIGH (ref 4.8–5.6)
Mean Plasma Glucose: 194.38 mg/dL

## 2018-10-19 LAB — LIPASE, BLOOD: Lipase: 45 U/L (ref 11–51)

## 2018-10-19 LAB — PROTIME-INR
INR: 1.2 (ref 0.8–1.2)
Prothrombin Time: 14.9 seconds (ref 11.4–15.2)

## 2018-10-19 LAB — CBG MONITORING, ED: Glucose-Capillary: 241 mg/dL — ABNORMAL HIGH (ref 70–99)

## 2018-10-19 MED ORDER — CHLORHEXIDINE GLUCONATE CLOTH 2 % EX PADS
6.0000 | MEDICATED_PAD | Freq: Every day | CUTANEOUS | Status: DC
  Administered 2018-10-19: 6 via TOPICAL

## 2018-10-19 MED ORDER — TECHNETIUM TC 99M MEBROFENIN IV KIT
5.4100 | PACK | Freq: Once | INTRAVENOUS | Status: AC | PRN
  Administered 2018-10-19: 5.41 via INTRAVENOUS
  Filled 2018-10-19: qty 6

## 2018-10-19 MED ORDER — DARBEPOETIN ALFA 25 MCG/0.42ML IJ SOSY
25.0000 ug | PREFILLED_SYRINGE | INTRAMUSCULAR | Status: DC
  Administered 2018-10-19: 25 ug via INTRAVENOUS

## 2018-10-19 MED ORDER — CINACALCET HCL 30 MG PO TABS
60.0000 mg | ORAL_TABLET | Freq: Every day | ORAL | Status: DC
  Administered 2018-10-20 – 2018-10-21 (×2): 60 mg via ORAL
  Filled 2018-10-19 (×3): qty 2

## 2018-10-19 MED ORDER — DOXERCALCIFEROL 4 MCG/2ML IV SOLN
INTRAVENOUS | Status: AC
  Filled 2018-10-19: qty 4

## 2018-10-19 MED ORDER — DOXERCALCIFEROL 4 MCG/2ML IV SOLN
8.0000 ug | INTRAVENOUS | Status: DC
  Administered 2018-10-19: 8 ug via INTRAVENOUS
  Filled 2018-10-19: qty 4

## 2018-10-19 MED ORDER — DARBEPOETIN ALFA 25 MCG/0.42ML IJ SOSY
PREFILLED_SYRINGE | INTRAMUSCULAR | Status: AC
  Filled 2018-10-19: qty 0.42

## 2018-10-19 NOTE — Progress Notes (Signed)
Progress Note    Edgar Brewer  AJO:[id number] DOB: 01-Dec-1960  DOA: 10/18/2018 PCP: System, Pcp Not In    Brief Narrative:     Medical records reviewed and are as summarized below:  Edgar Brewer is an 58 y.o. male with medical history significant for ESRD, type 2 diabetes mellitus, chronic combined systolic and diastolic CHF, hypertension, and recent admission with laparoscopic cholecystectomy on 10/04/2018, discharged on 10/06/2018, and now returning to the ED with nausea and vomiting.    Assessment/Plan:   Principal Problem:   Nausea & vomiting Active Problems:   Chronic diastolic CHF (congestive heart failure) (HCC)   Diabetes mellitus type II, non insulin dependent (HCC)   ESRD (end stage renal disease) on dialysis (HCC)   Abnormal LFTs   Intraabdominal fluid collection   Intractable nausea and vomiting  Nausea/vomiting; infrahepatic fluid collection; elevated LFT's  - Pt is s/p lap chole on 9/10, now presents with one day of N/V and fever, found to have increase in alk phos, transaminases, and t bili from time of recent d/c with fluid collection along inferior aspect of liver on CT   - In light of reported fever and as abscess is ?, antibiotics started in ED  -blood cultures pending - Continue bowel-rest, Zosyn, symptom-control, check HIDA scan and may need ERCP   -GI consult appreciated: labs ordered -GS consult appreciated  ESRD  - Patient reports completing HD on 10/17/18   -getting HD again this AM - SLIV, renally-dose medications    Type II DM  - Managed with Tradjenta at home, held on admission  - Use SSI with Novolog while in hospital    obesity Body mass index is 30.2 kg/m.   Family Communication/Anticipated D/C date and plan/Code Status   DVT prophylaxis: scd Code Status: Full Code.  Family Communication:  Disposition Plan: pending HIDA and possible ERCP   Medical Consultants:    GI ([facility])  General surgery  nephrology  Subjective:    Thinks his nausea may be better  Objective:    Vitals:   10/19/18 0408 10/19/18 0928 10/19/18 0948 10/19/18 1000  BP: (!) 150/68 (!) 149/76 137/74 131/74  Pulse: 91 88 84 81  Resp: (!) 21 16    Temp: 99.1 F (37.3 C) 98.6 F (37 C)    TempSrc: Oral Oral    SpO2: 93% 94%    Weight: 88.5 kg 90.1 kg    Height: 5' 8"  (1.727 m)       Intake/Output Summary (Last 24 hours) at 10/19/2018 1046 Last data filed at 10/19/2018 0500 Gross per 24 hour  Intake 2.26 ml  Output -  Net 2.26 ml   Filed Weights   10/19/18 0408 10/19/18 0928  Weight: 88.5 kg 90.1 kg    Exam: In bed, flat affect rrr +BS, soft, tender in RUQ with palpation A+Ox3 No increased work of breathing  Data Reviewed:   I have personally reviewed following labs and imaging studies:  Labs: Labs show the following:   Basic Metabolic Panel: Recent Labs  Lab 10/18/18 1440 10/19/18 0321  NA 134* 137  K 4.9 3.6  CL 91* 94*  CO2 29 32  GLUCOSE 331* 192*  BUN 25* 28*  CREATININE 4.31* 4.87*  CALCIUM 9.0 8.9   GFR Estimated Creatinine Clearance: 17.8 mL/min (A) (by C-G formula based on SCr of 4.87 mg/dL (H)). Liver Function Tests: Recent Labs  Lab 10/18/18 1440 10/19/18 0321  AST 499* 499*  ALT 579* 592*  ALKPHOS  1,404* 1,214*  BILITOT 5.4* 4.0*  PROT 6.6 6.0*  ALBUMIN 3.0* 2.6*   Recent Labs  Lab 10/18/18 1440 10/18/18 2358  LIPASE 57* 45   No results for input(s): AMMONIA in the last 168 hours. Coagulation profile Recent Labs  Lab 10/19/18 0321  INR 1.2    CBC: Recent Labs  Lab 10/18/18 1440 10/19/18 0321  WBC 8.8 4.5  HGB 12.5* 9.7*  HCT 36.5* 29.0*  MCV 87.3 90.1  PLT 204 200   Cardiac Enzymes: No results for input(s): CKTOTAL, CKMB, CKMBINDEX, TROPONINI in the last 168 hours. BNP (last 3 results) No results for input(s): PROBNP in the last 8760 hours. CBG: Recent Labs  Lab 10/19/18 0030 10/19/18 0411 10/19/18 0717  GLUCAP 241* 177* 167*   D-Dimer: No results for  input(s): DDIMER in the last 72 hours. Hgb A1c: Recent Labs    10/19/18 0321  HGBA1C 8.4*   Lipid Profile: No results for input(s): CHOL, HDL, LDLCALC, TRIG, CHOLHDL, LDLDIRECT in the last 72 hours. Thyroid function studies: No results for input(s): TSH, T4TOTAL, T3FREE, THYROIDAB in the last 72 hours.  Invalid input(s): FREET3 Anemia work up: No results for input(s): VITAMINB12, FOLATE, FERRITIN, TIBC, IRON, RETICCTPCT in the last 72 hours. Sepsis Labs: Recent Labs  Lab 10/18/18 1440 10/19/18 0321  WBC 8.8 4.5    Microbiology Recent Results (from the past 240 hour(s))  SARS CORONAVIRUS 2 (TAT 6-24 HRS) Nasopharyngeal Nasopharyngeal Swab     Status: None   Collection Time: 10/18/18  9:22 PM   Specimen: Nasopharyngeal Swab  Result Value Ref Range Status   SARS Coronavirus 2 NEGATIVE NEGATIVE Final    Comment: (NOTE) SARS-CoV-2 target nucleic acids are NOT DETECTED. The SARS-CoV-2 RNA is generally detectable in upper and lower respiratory specimens during the acute phase of infection. Negative results do not preclude SARS-CoV-2 infection, do not rule out co-infections with other pathogens, and should not be used as the sole basis for treatment or other patient management decisions. Negative results must be combined with clinical observations, patient history, and epidemiological information. The expected result is Negative. Fact Sheet for Patients: SugarRoll.be Fact Sheet for Healthcare Providers: https://www.woods-mathews.com/ This test is not yet approved or cleared by the [place] FDA and  has been authorized for detection and/or diagnosis of SARS-CoV-2 by FDA under an Emergency Use Authorization (EUA). This EUA will remain  in effect (meaning this test can be used) for the duration of the COVID-19 declaration under Section 56 4(b)(1) of the Act, 21 U.S.C. section 360bbb-3(b)(1), unless the authorization is terminated or  revoked sooner. Performed at [facility], [place] [address], [place], [place] [address]     Procedures and diagnostic studies:  Ct Abdomen Pelvis W Contrast  Result Date: 10/18/2018 CLINICAL DATA:  58 year old male with nausea and vomiting. Concern for retained stone. EXAM: CT ABDOMEN AND PELVIS WITH CONTRAST TECHNIQUE: Multidetector CT imaging of the abdomen and pelvis was performed using the standard protocol following bolus administration of intravenous contrast. CONTRAST:  185m OMNIPAQUE IOHEXOL 300 MG/ML  SOLN COMPARISON:  CT of the abdomen pelvis dated 10/02/2018 FINDINGS: Lower chest: Bibasilar linear atelectasis/scarring. No intra-abdominal free air or free fluid. Hepatobiliary: Probable mild fatty infiltration of the liver. No intrahepatic biliary ductal dilatation. Cholecystectomy. There is a 3 x 2 cm fluid pocket along the inferior surface of the liver in the cholecystectomy. This may represent a seroma. A biloma or an abscess are not excluded. Clinical correlation is recommended. No retained calcified stone  noted the central CBD. Pancreas: Unremarkable. No pancreatic ductal dilatation or surrounding inflammatory changes. Spleen: Mild splenomegaly measuring up to 15 cm length. Adrenals/Urinary Tract: The adrenal glands are unremarkable. There is no hydronephrosis on either side. There is symmetric enhancement and excretion of contrast by both kidneys. The visualized ureters and urinary bladder appear unremarkable. Stomach/Bowel: There is sigmoid diverticulosis and scattered colonic diverticula. Mild haziness adjacent to the hepatic flexure diverticula likely related to stranding from cholecystectomy and less likely related to diverticulitis. There is no bowel obstruction. The appendix is normal. Vascular/Lymphatic: Moderate aortoiliac atherosclerotic disease. IVC is unremarkable. No portal venous gas. There is no adenopathy. Reproductive: The prostate and seminal vesicles are grossly  unremarkable. No pelvic mass. Other: None Musculoskeletal: Degenerative changes of the spine. L5-S1 disc desiccation and vacuum phenomena. No acute osseous pathology. IMPRESSION: 1. A 3 x 2 cm fluid pocket along the inferior surface of the liver in the cholecystectomy. This may represent a seroma. A biloma or an abscess are not excluded. Clinical correlation is recommended. No retained calcified stone noted in the central CBD. 2. Colonic diverticulosis. Minimal stranding adjacent to the hepatic flexure, likely secondary to cholecystectomy and less likely acute cholecystitis. No bowel obstruction. Normal appendix. Aortic Atherosclerosis (ICD10-I70.0). Electronically Signed   By: Anner Crete M.D.   On: 10/18/2018 20:40    Medications:   . Chlorhexidine Gluconate Cloth  6 each Topical Q0600  . cinacalcet  60 mg Oral Q supper  . darbepoetin (ARANESP) injection - DIALYSIS  25 mcg Intravenous Q Fri-HD  . doxercalciferol  8 mcg Intravenous Q M,W,F-HD  . insulin aspart  0-9 Units Subcutaneous Q4H  . sodium chloride flush  3 mL Intravenous Once  . sodium chloride flush  3 mL Intravenous Q12H   Continuous Infusions: . sodium chloride    . piperacillin-tazobactam (ZOSYN)  IV 3.375 g (10/19/18 0447)     LOS: 0 days   Geradine Girt  [facility]   How to contact the [facility] Attending or Consulting provider [place] or covering provider during after hours [place], for this patient?  1. Check the care team in [facility] and look for a) attending/consulting [facility] provider listed and b) the [facility] team listed 2. Log into [email or web address] and use 's universal password to access. If you do not have the password, please contact the hospital operator. 3. Locate the [facility] provider you are looking for under [facility] and page to a number that you can be directly reached. 4. If you still have difficulty reaching the provider, please page the [facility] (Director on Call) for the Hospitalists listed on amion  for assistance.  10/19/2018, 10:46 AM

## 2018-10-19 NOTE — Progress Notes (Signed)
[facility] Progress Note     Subjective: CC: RUQ pain Patient reports he may feel slightly better. Denies nausea this AM. Seen in dialysis  Objective: Vital signs in last 24 hours: Temp:  [98.6 F (37 C)-99.8 F (37.7 C)] 98.6 F (37 C) (09/25 0928) Pulse Rate:  [81-98] 81 (09/25 1000) Resp:  [16-24] 16 (09/25 0928) BP: (131-169)/(62-92) 131/74 (09/25 1000) SpO2:  [90 %-99 %] 94 % (09/25 0928) Weight:  [88.5 kg-90.1 kg] 90.1 kg (09/25 0928) Last BM Date: 10/18/18  Intake/Output from previous day: 09/24 0701 - 09/25 0700 In: 2.3 [IV Piggyback:2.3] Out: -  Intake/Output this shift: No intake/output data recorded.  PE: Gen:  Alert, NAD, pleasant Card:  Regular rate and rhythm Pulm:  Normal effort, clear to auscultation bilaterally Abd: Soft, mildly TTP in RUQ and epigastrium, non-distended, +BS Skin: warm and dry, no rashes  Psych: A&Ox3   Lab Results:  Recent Labs    10/18/18 1440 10/19/18 0321  WBC 8.8 4.5  HGB 12.5* 9.7*  HCT 36.5* 29.0*  PLT 204 200   BMET Recent Labs    10/18/18 1440 10/19/18 0321  NA 134* 137  K 4.9 3.6  CL 91* 94*  CO2 29 32  GLUCOSE 331* 192*  BUN 25* 28*  CREATININE 4.31* 4.87*  CALCIUM 9.0 8.9   PT/INR Recent Labs    10/19/18 0321  LABPROT 14.9  INR 1.2   CMP     Component Value Date/Time   NA 137 10/19/2018 0321   NA 140 03/04/2016 1317   K 3.6 10/19/2018 0321   CL 94 (L) 10/19/2018 0321   CO2 32 10/19/2018 0321   GLUCOSE 192 (H) 10/19/2018 0321   BUN 28 (H) 10/19/2018 0321   BUN 47 (H) 03/04/2016 1317   CREATININE 4.87 (H) 10/19/2018 0321   CALCIUM 8.9 10/19/2018 0321   PROT 6.0 (L) 10/19/2018 0321   PROT 5.2 (L) 02/29/2016 1439   ALBUMIN 2.6 (L) 10/19/2018 0321   ALBUMIN 3.1 (L) 02/29/2016 1439   AST 499 (H) 10/19/2018 0321   ALT 592 (H) 10/19/2018 0321   ALKPHOS 1,214 (H) 10/19/2018 0321   BILITOT 4.0 (H) 10/19/2018 0321   BILITOT 0.2 02/29/2016 1439   GFRNONAA 12 (L) 10/19/2018 0321    GFRAA 14 (L) 10/19/2018 0321   Lipase     Component Value Date/Time   LIPASE 45 10/18/2018 2358       Studies/Results: Ct Abdomen Pelvis W Contrast  Result Date: 10/18/2018 CLINICAL DATA:  58 year old male with nausea and vomiting. Concern for retained stone. EXAM: CT ABDOMEN AND PELVIS WITH CONTRAST TECHNIQUE: Multidetector CT imaging of the abdomen and pelvis was performed using the standard protocol following bolus administration of intravenous contrast. CONTRAST:  164mL OMNIPAQUE IOHEXOL 300 MG/ML  SOLN COMPARISON:  CT of the abdomen pelvis dated 10/02/2018 FINDINGS: Lower chest: Bibasilar linear atelectasis/scarring. No intra-abdominal free air or free fluid. Hepatobiliary: Probable mild fatty infiltration of the liver. No intrahepatic biliary ductal dilatation. Cholecystectomy. There is a 3 x 2 cm fluid pocket along the inferior surface of the liver in the cholecystectomy. This may represent a seroma. A biloma or an abscess are not excluded. Clinical correlation is recommended. No retained calcified stone noted the central CBD. Pancreas: Unremarkable. No pancreatic ductal dilatation or surrounding inflammatory changes. Spleen: Mild splenomegaly measuring up to 15 cm length. Adrenals/Urinary Tract: The adrenal glands are unremarkable. There is no hydronephrosis on either side. There is symmetric enhancement and excretion of contrast by  both kidneys. The visualized ureters and urinary bladder appear unremarkable. Stomach/Bowel: There is sigmoid diverticulosis and scattered colonic diverticula. Mild haziness adjacent to the hepatic flexure diverticula likely related to stranding from cholecystectomy and less likely related to diverticulitis. There is no bowel obstruction. The appendix is normal. Vascular/Lymphatic: Moderate aortoiliac atherosclerotic disease. IVC is unremarkable. No portal venous gas. There is no adenopathy. Reproductive: The prostate and seminal vesicles are grossly unremarkable.  No pelvic mass. Other: None Musculoskeletal: Degenerative changes of the spine. L5-S1 disc desiccation and vacuum phenomena. No acute osseous pathology. IMPRESSION: 1. A 3 x 2 cm fluid pocket along the inferior surface of the liver in the cholecystectomy. This may represent a seroma. A biloma or an abscess are not excluded. Clinical correlation is recommended. No retained calcified stone noted in the central CBD. 2. Colonic diverticulosis. Minimal stranding adjacent to the hepatic flexure, likely secondary to cholecystectomy and less likely acute cholecystitis. No bowel obstruction. Normal appendix. Aortic Atherosclerosis (ICD10-I70.0). Electronically Signed   By: Anner Crete M.D.   On: 10/18/2018 20:40    Anti-infectives: Anti-infectives (From admission, onward)   Start     Dose/Rate Route Frequency Ordered Stop   10/19/18 0600  piperacillin-tazobactam (ZOSYN) IVPB 3.375 g     3.375 g 12.5 mL/hr over 240 Minutes Intravenous Every 12 hours 10/18/18 2208     10/18/18 1945  piperacillin-tazobactam (ZOSYN) IVPB 3.375 g     3.375 g 100 mL/hr over 30 Minutes Intravenous  Once 10/18/18 1942 10/18/18 2219       Assessment/Plan HTN ESRD T2DM  S/p lap chole 10/04/18 Dr. Kieth Brightly  small 3 x 2 cm collection in gallbladder bed with transaminitis and elevated bilirubin - recommend GI consult for possible retained stone - HIDA today - LFTs slightly better - afebrile, WBC 4.5  FEN: NPO VTE: SCDs ID: [id number] 9/24>>  LOS: 0 days    Brigid Re , [facility] 10/19/2018, 10:37 AM Pager: [phone] Consults: [phone] 7:00 AM - 4:30 PM M, W-F 7:00 AM - 11:30 AM Tues, Sat, Sun

## 2018-10-19 NOTE — Progress Notes (Addendum)
[facility] Consult: 8:35 AM 10/19/2018  LOS: 0 days    Referring Provider: Dr Eliseo Squires  Primary Care Physician:   [name] MD Primary Gastroenterologist:  Dr. Silverio Decamp     Reason for Consultation:  Bile leak?     HPI: Edgar Brewer is a 58 y.o. male.  Hx CHF, LVEF 45 to 50% 2016. ESRD, HD MWF.  Type 2 DM, previous insulin.  Diabetic retinopathy with visual loss.  Diabetic neuropathy. Pulm htn.  Memory loss.  Htn. Elevated alk phos dating to at least 2016 (no labs before that year), alk phos 197 in 04/2014,  300s- 400s in 2018. However previously T bili and transaminases not elevated.    Ultrasound of 02/5364: uncomplicated gallstones, small (presumed) ascites adjacent to liver.  Normal liver.  Slight increase in splenic cystic lesion of unclear type, ? From prior trauma.  Pancreatic tail obscured.    IDA dating to 2016 (Hgb 9 to 11) investigated with Colon, EGD 2018 03/2016 Colonoscopy.  10 polyps, one 20 mm, removed from left and recto-sigmoid colon.  Path TAs without HGD and HPs.  Diverticulosis scattered.  Non-bleeding int hemorrhoids 03/2016 EGD. Duodenal erythema (path: benign mucosa, no villous atrophy)   Omeprazole controls GERD sxs.     10/04/18 lap chole.  Uneventful surgery with intra op finding of moderate GB inflammation and distention. No IOC.   Presented with AMS and vomiting.  Elevated LFTs. AST/ALT 308/306, alk phos 762, T bili 3.6.  Lipase 74.    preop ultrasound: slight liver steatosis.  GB stones/sludge with increased GB wall thickness.  Preop CTAP w contrast: uncomplicated gallstones.  Liver, pancreas, biliary tree normal.   Colon diverticulosis.  Aortic atherosclerosis.  Preop MRCP: Cholelithiasis, GB wall thickening. No biliary dilatation or appreciable choledocholithiasis.  Dropout of signal in both  liver and spleen on inphase images, potentially from hemosiderosis or hemochromatosis.  Pancreas divisum.  Colonic diverticulosis.  Aortic Atherosclerosis Discharged home postop day 2.  He was tolerating solid food but did not feel well: moderate,lingering epigastric pain, but no N/V.  At home he experienced fatigue, ongoing nagging, mild pressure and discomfort in the epigastric region but able to eat.  No constipation, no diarrhea.  Yesterday AM, developed nonbloody emesis of white, mucoid material, no worsening of abdominal pain, fever to 101 F. Presented to ED yesterday afternoon, no fever, no tachycardia, no hypotension.  Oxygen sats in the 90s.  Since 9/8 admission to present:  T bili to 3.6 >> 5.4 >> 4  Alk Phos 762 >> 1404 >> 1214 AST/ALT 308/306 >> 499/579 >> 499/592.   Na normal.   PT/INR not prolonged.    BUN/Creat: 59/10 >> 28/4.8.    10/18/18 CTAP with contrast: 3 x 2 cm fluid pocket along inferior surface of liver, may represent a seroma.  Biloma or abscess not excluded.  No calcified stone noted in the central CBD.   Minor stranding at hepatic flexure, likely secondary to cholecystectomy and less likely acute diverticulitis.  Colonic diverticulosis. No bowel obstruction. Normal appendix. HIDA scan planned post HD  this afternoon  Fm Hx: Dad died at 59 with pancreatic cancer.  His mother is living at 62.  Social Hx: Patient and his brother live at home with their 24 year old mother. Smoked up until ~ 2017.   Occasional, not excessive ETOH.     Past Medical History:  Diagnosis Date  . Acute on chronic combined systolic and diastolic CHF, NYHA class 1 ([place]) 01/13/2015  . Arthritis   . Chronic kidney disease    MWF [name].  . Diabetes mellitus without complication ([place])    Type II  . GERD (gastroesophageal reflux disease)    at times  . Head injury with loss of consciousness ([place])   . History of kidney stones   . Hypertension   . Neuropathy   . Pulmonary hypertension (HCC)     PA peak pressure 41 mmHg 03/04/16 echo  . Varicose veins   . Varicose veins of lower extremities with complications 17/40/8144    Past Surgical History:  Procedure Laterality Date  . AV FISTULA PLACEMENT Left 06/01/2016   Procedure: ARTERIOVENOUS (AV) FISTULA CREATION;  Surgeon: [name], MD;  Location: [facility];  Service: Vascular;  Laterality: Left;  . AV FISTULA PLACEMENT Left 08/10/2017   Procedure: BRACHIOCEPHALIC ARTERIOVENOUS (AV) FISTULA CREATION LEFT UPPER EXTREMITY;  Surgeon: [name], MD;  Location: [facility];  Service: Vascular;  Laterality: Left;  . CHOLECYSTECTOMY N/A 10/04/2018   Procedure: LAPAROSCOPIC CHOLECYSTECTOMY;  Surgeon: Kinsinger, Arta Bruce, MD;  Location: [facility];  Service: General;  Laterality: N/A;  . COLONOSCOPY W/ POLYPECTOMY    . ENDOVENOUS ABLATION SAPHENOUS VEIN W/ LASER Left 07-23-2015   endovenous laser ablation left greater saphenous vein by [name]  . ENDOVENOUS ABLATION SAPHENOUS VEIN W/ LASER Right 10/15/2015   EVLA right greater saphenous vein by [name]    Prior to Admission medications   Medication Sig Start Date End Date Taking? Authorizing Provider  atorvastatin (LIPITOR) 20 MG tablet Take 20 mg by mouth daily.  11/02/17   [provider]  b complex-vitamin c-folic acid (NEPHRO-VITE) 0.8 MG TABS tablet Take 1 tablet by mouth daily.    [provider]  cinacalcet (SENSIPAR) 30 MG tablet Take 2 tablets (60 mg total) by mouth daily with supper. 10/06/18   Swayze, Ava, DO  Docusate Sodium (STOOL SOFTENER LAXATIVE PO) Take 1 tablet by mouth daily.     [provider]  doxercalciferol (HECTOROL) 4 MCG/2ML injection Inject 5 mLs (10 mcg total) into the vein every Monday, Wednesday, and Friday with hemodialysis. 10/08/18   Swayze, Ava, DO  ferric citrate (AURYXIA) 1 GM 210 MG(Fe) tablet Take 420 mg by mouth 3 (three) times daily with meals.    [provider]  gabapentin (NEURONTIN) 300 MG capsule  Take 300 mg by mouth 2 (two) times daily.    [provider]  glipiZIDE (GLUCOTROL) 10 MG tablet Take 10 mg by mouth daily before breakfast.    [provider]  linagliptin (TRADJENTA) 5 MG TABS tablet Take 5 mg by mouth daily.    [provider]  omeprazole (PRILOSEC) 20 MG capsule Take 20 mg by mouth daily. 11/25/17   [provider]  oxyCODONE (OXY IR/ROXICODONE) 5 MG immediate release tablet Take 1-2 tablets (5-10 mg total) by mouth every 4 (four) hours as needed for moderate pain. 10/06/18   Swayze, Ava, DO  sevelamer carbonate (RENVELA) 800 MG tablet Take 1,600 mg by mouth 3 (three) times daily with meals.  [provider]  sitaGLIPtin (JANUVIA) 100 MG tablet Take 100 mg by mouth daily.    [provider]    Scheduled Meds: . insulin aspart  0-9 Units Subcutaneous Q4H  . sodium chloride flush  3 mL Intravenous Once  . sodium chloride flush  3 mL Intravenous Q12H   Infusions: . sodium chloride    . piperacillin-tazobactam (ZOSYN)  IV 3.375 g (10/19/18 0447)   PRN Meds: sodium chloride, fentaNYL (SUBLIMAZE) injection, labetalol, ondansetron **OR** ondansetron (ZOFRAN) IV, sodium chloride flush   Allergies as of 10/18/2018 - Review Complete 10/18/2018  Allergen Reaction Noted  . Codeine Hives, Swelling, and Other (See Comments) 08/08/2013    Family History  Problem Relation Age of Onset  . Cancer Father     Social History   Socioeconomic History  . Marital status: Legally Separated    Spouse name: Not on file  . Number of children: Not on file  . Years of education: Not on file  . Highest education level: Not on file  Occupational History  . Not on file  Social Needs  . Financial resource strain: Not on file  . Food insecurity    Worry: Never true    Inability: Never true  . Transportation needs    Medical: No    Non-medical: No  Tobacco Use  . Smoking status: Former Smoker    Packs/day: 1.00    Years: 45.00     Pack years: 45.00    Types: Cigarettes    Quit date: 01/13/2015    Years since quitting: 3.7  . Smokeless tobacco: Never Used  Substance and Sexual Activity  . Alcohol use: Yes    Alcohol/week: 1.0 standard drinks    Types: 1 Glasses of wine per week    Comment: rare occassion  . Drug use: No  . Sexual activity: Not on file  Lifestyle  . Physical activity    Days per week: Not on file    Minutes per session: Not on file  . Stress: Not on file  Relationships  . Social Herbalist on phone: Not on file    Gets together: Not on file    Attends religious service: Not on file    Active member of club or organization: Not on file    Attends meetings of clubs or organizations: Not on file    Relationship status: Not on file  . Intimate partner violence    Fear of current or ex partner: Not on file    Emotionally abused: Not on file    Physically abused: Not on file    Forced sexual activity: Not on file  Other Topics Concern  . Not on file  Social History Narrative  . Not on file    REVIEW OF SYSTEMS: Constitutional: Weakness, fatigue. ENT:  No nose bleeds Pulm: No shortness of breath, no cough CV:  No palpitations, no LE edema.  GU:  No hematuria, no frequency.  Oliguric but still makes some urine.  Urine is orange. GI: See HPI.  No dysphagia.  No bleeding per rectum.  No melena. Heme: Denies excessive or unusual bleeding or bruising. Transfusions: No previous transfusion with blood products per review of epic chart. Neuro:  No headaches, no peripheral tingling or numbness Derm:  No itching, no rash or sores.  Endocrine:  No sweats or chills.  No polyuria or dysuria Immunization: Reviewed.  Note he received flu shot on 10/04/2018. Travel:  None beyond local counties  in last few months.    PHYSICAL EXAM: Vital signs in last 24 hours: Vitals:   10/19/18 0339 10/19/18 0408  BP: 135/73 (!) 150/68  Pulse: 88 91  Resp:  (!) 21  Temp:  99.1 F (37.3 C)   SpO2: 94% 93%   Wt Readings from Last 3 Encounters:  10/19/18 88.5 kg  10/04/18 90.3 kg  08/28/18 93.9 kg    General: Overweight, calm, comfortable WM with slight speech impairment. Head: No facial asymmetry or swelling.  No signs of head trauma. Eyes: No scleral icterus.  No conjunctival pallor.  EOMI. Ears: Not hard of hearing Nose: No congestion or discharge Mouth: Good dentition.  Moist, pink, clear mucosa.  Tongue midline. Neck: No JVD, no thyromegaly, no masses. Lungs: No labored breathing, no cough.  Lungs clear bilaterally. Heart: RRR.  No MRG.  S1, S2 present. Abdomen: Soft.  Minor if any tenderness in the epigastric region.  Not distended.  Well-healed laparoscopic scars.  Bowel sounds active.  No mass, no HSM, no bruits, no hernias. Rectal: Deferred DRE. Musc/Skeltl: No joint redness, swelling, gross deformity.  Slight venous discoloration to the skin on the lower legs. Extremities: No CCE.  Dialysis AV graft on upper left arm.  Thrill palpable. Neurologic: Oriented x3.  Moves all 4 limbs.  No tremor, no limb weakness.  Slow response to questions with speech pattern suggestive of someone with difficulty hearing or with cognitive impairment. Skin: + jaundiced Nodes: No cervical adenopathy Psych: Cooperative.  restricted affect.    Intake/Output from previous day: 09/24 0701 - 09/25 0700 In: 2.3 [IV Piggyback:2.3] Out: -  Intake/Output this shift: No intake/output data recorded.  LAB RESULTS: Recent Labs    10/18/18 1440 10/19/18 0321  WBC 8.8 4.5  HGB 12.5* 9.7*  HCT 36.5* 29.0*  PLT 204 200   BMET Lab Results  Component Value Date   NA 137 10/19/2018   NA 134 (L) 10/18/2018   NA 135 10/06/2018   K 3.6 10/19/2018   K 4.9 10/18/2018   K 4.5 10/06/2018   CL 94 (L) 10/19/2018   CL 91 (L) 10/18/2018   CL 92 (L) 10/06/2018   CO2 32 10/19/2018   CO2 29 10/18/2018   CO2 25 10/06/2018   GLUCOSE 192 (H) 10/19/2018   GLUCOSE 331 (H) 10/18/2018   GLUCOSE  246 (H) 10/06/2018   BUN 28 (H) 10/19/2018   BUN 25 (H) 10/18/2018   BUN 38 (H) 10/06/2018   CREATININE 4.87 (H) 10/19/2018   CREATININE 4.31 (H) 10/18/2018   CREATININE 6.65 (H) 10/06/2018   CALCIUM 8.9 10/19/2018   CALCIUM 9.0 10/18/2018   CALCIUM 8.3 (L) 10/06/2018   LFT Recent Labs    10/18/18 1440 10/19/18 0321  PROT 6.6 6.0*  ALBUMIN 3.0* 2.6*  AST 499* 499*  ALT 579* 592*  ALKPHOS 1,404* 1,214*  BILITOT 5.4* 4.0*   PT/INR Lab Results  Component Value Date   INR 1.2 10/19/2018   INR 1.08 01/13/2015   Hepatitis Panel No results for input(s): HEPBSAG, HCVAB, HEPAIGM, HEPBIGM in the last 72 hours. Lipase     Component Value Date/Time   LIPASE 45 10/18/2018 2358   Multiple previous iron levels normal.  Drugs of Abuse  No results found for: LABOPIA, COCAINSCRNUR, LABBENZ, AMPHETMU, THCU, LABBARB   RADIOLOGY STUDIES: Ct Abdomen Pelvis W Contrast  Result Date: 10/18/2018 CLINICAL DATA:  58 year old male with nausea and vomiting. Concern for retained stone. EXAM: CT ABDOMEN AND PELVIS WITH CONTRAST TECHNIQUE: Multidetector  CT imaging of the abdomen and pelvis was performed using the standard protocol following bolus administration of intravenous contrast. CONTRAST:  139m OMNIPAQUE IOHEXOL 300 MG/ML  SOLN COMPARISON:  CT of the abdomen pelvis dated 10/02/2018 FINDINGS: Lower chest: Bibasilar linear atelectasis/scarring. No intra-abdominal free air or free fluid. Hepatobiliary: Probable mild fatty infiltration of the liver. No intrahepatic biliary ductal dilatation. Cholecystectomy. There is a 3 x 2 cm fluid pocket along the inferior surface of the liver in the cholecystectomy. This may represent a seroma. A biloma or an abscess are not excluded. Clinical correlation is recommended. No retained calcified stone noted the central CBD. Pancreas: Unremarkable. No pancreatic ductal dilatation or surrounding inflammatory changes. Spleen: Mild splenomegaly measuring up to 15 cm  length. Adrenals/Urinary Tract: The adrenal glands are unremarkable. There is no hydronephrosis on either side. There is symmetric enhancement and excretion of contrast by both kidneys. The visualized ureters and urinary bladder appear unremarkable. Stomach/Bowel: There is sigmoid diverticulosis and scattered colonic diverticula. Mild haziness adjacent to the hepatic flexure diverticula likely related to stranding from cholecystectomy and less likely related to diverticulitis. There is no bowel obstruction. The appendix is normal. Vascular/Lymphatic: Moderate aortoiliac atherosclerotic disease. IVC is unremarkable. No portal venous gas. There is no adenopathy. Reproductive: The prostate and seminal vesicles are grossly unremarkable. No pelvic mass. Other: None Musculoskeletal: Degenerative changes of the spine. L5-S1 disc desiccation and vacuum phenomena. No acute osseous pathology. IMPRESSION: 1. A 3 x 2 cm fluid pocket along the inferior surface of the liver in the cholecystectomy. This may represent a seroma. A biloma or an abscess are not excluded. Clinical correlation is recommended. No retained calcified stone noted in the central CBD. 2. Colonic diverticulosis. Minimal stranding adjacent to the hepatic flexure, likely secondary to cholecystectomy and less likely acute cholecystitis. No bowel obstruction. Normal appendix. Aortic Atherosclerosis (ICD10-I70.0). Electronically Signed   By: AAnner CreteM.D.   On: 10/18/2018 20:40   I personally reviewed the HIDA scan images from today with the reading radiologist.  While there is delayed excretion of her from the liver (because of the cholestasis), it eventually passes into the extrahepatic biliary system and then the duodenum.  No bile leak was seen.  IMPRESSION:   *    Elevated LFTs > 1 week out from unremarkable lap chole. Long hx isolated alk phos elevation which is now markedly elevated and a/w new T bili, transaminase elevation.  Also new is  N/V  and fever but normal WBCs. Preoperative ultrasound and MRCP confirmed uncomplicated cholelithiasis.  Ultrasound showed fatty liver, MRCP showed abnormal liver spleen signal raising question of hemosiderosis or hemochromatosis. CT scan yesterday with seroma versus biloma versus abscess and perihepatic stranding.    ? Bile leak, HIDA planned later today.  However, pt's epigastric pain is mild.  Typically pts with biloma display severe abdominal pain, though he is diabetic so may have atypical biloma presentation.   Other reasons could be previously undetected intrinsic liver dz (PBC, PSC, hemochromatosis).   ? renal osteodystrophy, however HD requiring ESRD began 01/2017 and alk phos elevation dates to at least 04/2014 when he had stage 3 CKD.   Day 2 empiric Zosyn.     *   ESRD, due to DM.  HD since 01/2017.    *   Type 2 DM, A1c 8.4 c/w serum Glucose in 190s.   Diabetic nephropathy, neuropathy, retinopathy.  *    Normocytic anemia.  IDA in 2017.  receives Venofer (last on 9/2).  Nephrology plans to start Aranesp today but previously no ESA and now plan avoiding IV iron due to ? Hemosiderosis on MRCP.     PLAN:     *  GGT, Hep B surface Ab and Ag, Hep C Ab ordered.   Await HIDA. Caveat is HIDa may be non-diagnostic for leak given T bil of 4.      Azucena Freed  10/19/2018, 8:35 AM Phone [phone]    I have reviewed the entire case in detail with the above APP and discussed the plan in detail.  Therefore, I agree with the diagnoses recorded above. In addition,  I have personally interviewed and examined the patient and have personally reviewed any abdominal/pelvic CT scan images. His mother was present for the entire visit.  My additional thoughts are as follows:  This is a complex case, in which I have spent a long time today reviewing studies both before and after seeing this patient.  He was recently admitted with apparent cholecystitis, at which time he had significant elevations of  both transaminases and cholestatic labs.  Looking back, he has had an isolated but significant elevation of the alkaline phosphatase going back to at least 2016 but no GGT know if it was hepatobiliary in origin.  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 However, that raises the concern for some chronic cholestatic liver condition such as PBC or PSC.  There were no reported MRCP findings of [place] on the recent admission, but it should be noted that was a somewhat limited exam due to motion artifact.  No choledocholithiasis was seen on that study either, with the same caveat.  No intraoperative cholangiogram was done.  The initial clinical concern of both medical and surgical service on this admission is for choledocholithiasis and possibly bile leak, especially as his mother reports he had a fever nausea and upper abdominal pain.Marland Kitchen  However, no bile leak was seen on today's HIDA scan, and the persistent normal caliber of the biliary tree with this degree of elevation of alkaline phosphatase and bilirubin along with significant transaminitis indicate this may be some intrinsic liver disease. It could be some chronic autoimmune and cholestatic liver disease as noted above, or perhaps acute viral hepatitis or reactivation of latent hepatitis infection.  Dialysis patients should be regularly checked for and vaccinated for hepatitis B, but he could have hepatitis A infection.  Hepatitis C would be very unusual to cause cholestatic hepatitis.  His last echocardiogram a year ago had modestly elevated PA pressure of 54, and I reviewed that last cardiology clinic note by Dr. [name].  However, Parker does not seem to have elevated JVP nor hepatojugular reflux on exam, and CT scan of the abdomen sees the lower chest and there is no obvious pericardial effusion.  There is no clot in the IVC or hepatic vasculature.  The small fluid collection of the  gallbladder fossa seems likely to be a seroma, as there is no bile leak on HIDA scan and surgery does not seem to think it is likely to be an abscess.  My plan is to get hepatitis serologies, autoimmune liver disease serologies, iron studies, and see how LFTs trend in the next couple of days, and also discuss it further with the biliary endoscopist working with me this weekend.  I will ask them to review the case and see if they feel an ERCP is warranted.  Regular diet ordered.  Will follow.  Nelida Meuse III Office:[phone]

## 2018-10-19 NOTE — Consult Note (Addendum)
[facility] Renal Consultation Note    Indication for Consultation:  Management of ESRD/hemodialysis, anemia, hypertension/volume, and secondary hyperparathyroidism.  HPI: Edgar Brewer is a 58 y.o. male with a PMH including ESRD on dailysis, T2DM, GERD, GTN, neuropathy, pulmonary htn, and recent laparoscopic cholecystectomy on 10/04/2018, who presented to the ED on 10/18/2018 with intractable nausea and vomiting. Reported a Tmax 101.35F at home. Upon arrival to the ED, T 37.7C, BP stable, alk phos 1404, AST 499, ALT 579,  K+ 4.9, Ca 9,0, albumin 3.0, Hgb 12.5 (9.7 this AM), WBC 8.8. CT scan revealed a 3 x 2 cm fluid collection along the inferior surface of the liver representing a seroma, bilioma, or abscess. General surgery and GI were consulted and recommended possible HIDA scan. He was started on zosyn and bowel rest. Nephrology was consulted for hemodialysis needs during admission.  Today, [name] reports he is feeling better. He is NPO but feels his appetite is improved.  Reports mild abdominal pain and nausea. Denies vomiting, diarrhea, melena, and hematochezia. No chills or sweats. He completed HD on 02/17/5807 without complication and denies SOB, cough, orthopnea, PND or edema. Denies urinary complaints including dysuria, hematuria, and flank pain. BP is currently moderately elevated at 150/68. Hgb was 12.5 on admission, but was 9.7 this AM. Patient was not an ESA as an outpatient and iron was previously not recommended due to possible iron deposits in the liver. Of note, his EDW had been lowered recently to 91kg, and his post-HD weight on 9/23 was 89.5kg.   Past Medical History:  Diagnosis Date  . Acute on chronic combined systolic and diastolic CHF, NYHA class 1 ([place]) 01/13/2015  . Arthritis   . Chronic kidney disease    MWF [name].  . Diabetes mellitus without complication ([place])    Type II  . GERD (gastroesophageal reflux disease)    at times  . Head injury with loss of  consciousness ([place])   . History of kidney stones   . Hypertension   . Neuropathy   . Pulmonary hypertension (H[facility])    PA peak pressure 41 mmHg 03/04/16 echo  . Varicose veins   . Varicose veins of lower extremities with complications 98/33/8250   Past Surgical History:  Procedure Laterality Date  . AV FISTULA PLACEMENT Left 06/01/2016   Procedure: ARTERIOVENOUS (AV) FISTULA CREATION;  Surgeon: [name], MD;  Location: [place];  Service: Vascular;  Laterality: Left;  . AV FISTULA PLACEMENT Left 08/10/2017   Procedure: BRACHIOCEPHALIC ARTERIOVENOUS (AV) FISTULA CREATION LEFT UPPER EXTREMITY;  Surgeon: [name], MD;  Location: [place];  Service: Vascular;  Laterality: Left;  . CHOLECYSTECTOMY N/A 10/04/2018   Procedure: LAPAROSCOPIC CHOLECYSTECTOMY;  Surgeon: Kinsinger, Arta Bruce, MD;  Location: [place];  Service: General;  Laterality: N/A;  . COLONOSCOPY W/ POLYPECTOMY    . ENDOVENOUS ABLATION SAPHENOUS VEIN W/ LASER Left 07-23-2015   endovenous laser ablation left greater saphenous vein by [name]  . ENDOVENOUS ABLATION SAPHENOUS VEIN W/ LASER Right 10/15/2015   EVLA right greater saphenous vein by [name]   Family History  Problem Relation Age of Onset  . Cancer Father    Social History:  reports that he quit smoking about 3 years ago. His smoking use included cigarettes. He has a 45.00 pack-year smoking history. He has never used smokeless tobacco. He reports current alcohol use of about 1.0 standard drinks of alcohol per week. He reports that he does not use drugs.  ROS: As per HPI otherwise negative.  Physical Exam: Vitals:   10/19/18 0045 10/19/18 0100 10/19/18 0339 10/19/18 0408  BP: 139/67 (!) 142/68 135/73 (!) 150/68  Pulse: 87 90 88 91  Resp:    (!) 21  Temp:    99.1 F (37.3 C)  TempSrc:    Oral  SpO2: 93% 92% 94% 93%  Weight:    88.5 kg  Height:    '5\' 8"'$  (1.727 m)     General: Well developed, well nourished, in no acute distress. Head:  Normocephalic, atraumatic, sclera non-icteric, mucus membranes are moist. Neck: JVD not elevated. Lungs: Clear bilaterally to auscultation without wheezes, rales, or rhonchi. Breathing is unlabored on room air Heart: RRR with normal S1, S2. No murmurs, rubs, or gallops appreciated. Abdomen: Soft, non-distended with normoactive bowel sounds. Mild tenderness on palpation of epigastric region No rebound/guarding. No obvious abdominal masses. Musculoskeletal:  Strength and tone appear normal for age. Lower extremities: No edema or ischemic changes, no open wounds. Neuro: Alert and oriented X 3. Moves all extremities spontaneously. Psych:  Responds to questions appropriately with a normal affect. Dialysis Access: LUE AVF + thrill/bruit  Allergies  Allergen Reactions  . Codeine Hives, Swelling and Other (See Comments)    Swelling all over body    Prior to Admission medications   Medication Sig Start Date End Date Taking? Authorizing Provider  atorvastatin (LIPITOR) 20 MG tablet Take 20 mg by mouth daily.  11/02/17   [provider]  b complex-vitamin c-folic acid (NEPHRO-VITE) 0.8 MG TABS tablet Take 1 tablet by mouth daily.    [provider]  cinacalcet (SENSIPAR) 30 MG tablet Take 2 tablets (60 mg total) by mouth daily with supper. 10/06/18   Swayze, Ava, DO  Docusate Sodium (STOOL SOFTENER LAXATIVE PO) Take 1 tablet by mouth daily.     [provider]  doxercalciferol (HECTOROL) 4 MCG/2ML injection Inject 5 mLs (10 mcg total) into the vein every Monday, Wednesday, and Friday with hemodialysis. 10/08/18   Swayze, Ava, DO  ferric citrate (AURYXIA) 1 GM 210 MG(Fe) tablet Take 420 mg by mouth 3 (three) times daily with meals.    [provider]  gabapentin (NEURONTIN) 300 MG capsule Take 300 mg by mouth 2 (two) times daily.    [provider]  glipiZIDE (GLUCOTROL) 10 MG tablet Take 10 mg by mouth daily before breakfast.    [provider]   linagliptin (TRADJENTA) 5 MG TABS tablet Take 5 mg by mouth daily.    [provider]  omeprazole (PRILOSEC) 20 MG capsule Take 20 mg by mouth daily. 11/25/17   [provider]  oxyCODONE (OXY IR/ROXICODONE) 5 MG immediate release tablet Take 1-2 tablets (5-10 mg total) by mouth every 4 (four) hours as needed for moderate pain. 10/06/18   Swayze, Ava, DO  sevelamer carbonate (RENVELA) 800 MG tablet Take 1,600 mg by mouth 3 (three) times daily with meals.    [provider]  sitaGLIPtin (JANUVIA) 100 MG tablet Take 100 mg by mouth daily.    [provider]   Current Facility-Administered Medications  Medication Dose Route Frequency Provider Last Rate Last Dose  . 0.9 %  sodium chloride infusion  250 mL Intravenous PRN Opyd, Ilene Qua, MD      . fentaNYL (SUBLIMAZE) injection 25-50 mcg  25-50 mcg Intravenous Q2H PRN Opyd, Ilene Qua, MD      . insulin aspart (novoLOG) injection 0-9 Units  0-9 Units Subcutaneous Q4H Opyd, Christia Reading  S, MD   2 Units at 10/19/18 0737  . labetalol (NORMODYNE) injection 10 mg  10 mg Intravenous Q2H PRN Opyd, Ilene Qua, MD      . ondansetron (ZOFRAN) tablet 4 mg  4 mg Oral Q6H PRN Opyd, Ilene Qua, MD       Or  . ondansetron (ZOFRAN) injection 4 mg  4 mg Intravenous Q6H PRN Opyd, Ilene Qua, MD      . piperacillin-tazobactam (ZOSYN) IVPB 3.375 g  3.375 g Intravenous Q12H Rumbarger, Rachel L, RPH 12.5 mL/hr at 10/19/18 0447 3.375 g at 10/19/18 0447  . sodium chloride flush (NS) 0.9 % injection 3 mL  3 mL Intravenous Once Opyd, Timothy S, MD      . sodium chloride flush (NS) 0.9 % injection 3 mL  3 mL Intravenous Q12H Opyd, Ilene Qua, MD   3 mL at 10/19/18 0450  . sodium chloride flush (NS) 0.9 % injection 3 mL  3 mL Intravenous PRN Vianne Bulls, MD       Labs: Basic Metabolic Panel: Recent Labs  Lab 10/18/18 1440 10/19/18 0321  NA 134* 137  K 4.9 3.6  CL 91* 94*  CO2 29 32  GLUCOSE 331* 192*  BUN 25* 28*  CREATININE 4.31* 4.87*   CALCIUM 9.0 8.9   Liver Function Tests: Recent Labs  Lab 10/18/18 1440 10/19/18 0321  AST 499* 499*  ALT 579* 592*  ALKPHOS 1,404* 1,214*  BILITOT 5.4* 4.0*  PROT 6.6 6.0*  ALBUMIN 3.0* 2.6*   Recent Labs  Lab 10/18/18 1440 10/18/18 2358  LIPASE 57* 45   CBC: Recent Labs  Lab 10/18/18 1440 10/19/18 0321  WBC 8.8 4.5  HGB 12.5* 9.7*  HCT 36.5* 29.0*  MCV 87.3 90.1  PLT 204 200   CBG: Recent Labs  Lab 10/19/18 0030 10/19/18 0411 10/19/18 0717  GLUCAP 241* 177* 167*   Studies/Results: Ct Abdomen Pelvis W Contrast  Result Date: 10/18/2018 CLINICAL DATA:  58 year old male with nausea and vomiting. Concern for retained stone. EXAM: CT ABDOMEN AND PELVIS WITH CONTRAST TECHNIQUE: Multidetector CT imaging of the abdomen and pelvis was performed using the standard protocol following bolus administration of intravenous contrast. CONTRAST:  137m OMNIPAQUE IOHEXOL 300 MG/ML  SOLN COMPARISON:  CT of the abdomen pelvis dated 10/02/2018 FINDINGS: Lower chest: Bibasilar linear atelectasis/scarring. No intra-abdominal free air or free fluid. Hepatobiliary: Probable mild fatty infiltration of the liver. No intrahepatic biliary ductal dilatation. Cholecystectomy. There is a 3 x 2 cm fluid pocket along the inferior surface of the liver in the cholecystectomy. This may represent a seroma. A biloma or an abscess are not excluded. Clinical correlation is recommended. No retained calcified stone noted the central CBD. Pancreas: Unremarkable. No pancreatic ductal dilatation or surrounding inflammatory changes. Spleen: Mild splenomegaly measuring up to 15 cm length. Adrenals/Urinary Tract: The adrenal glands are unremarkable. There is no hydronephrosis on either side. There is symmetric enhancement and excretion of contrast by both kidneys. The visualized ureters and urinary bladder appear unremarkable. Stomach/Bowel: There is sigmoid diverticulosis and scattered colonic diverticula. Mild haziness  adjacent to the hepatic flexure diverticula likely related to stranding from cholecystectomy and less likely related to diverticulitis. There is no bowel obstruction. The appendix is normal. Vascular/Lymphatic: Moderate aortoiliac atherosclerotic disease. IVC is unremarkable. No portal venous gas. There is no adenopathy. Reproductive: The prostate and seminal vesicles are grossly unremarkable. No pelvic mass. Other: None Musculoskeletal: Degenerative changes of the spine. L5-S1 disc desiccation and vacuum phenomena. No acute  osseous pathology. IMPRESSION: 1. A 3 x 2 cm fluid pocket along the inferior surface of the liver in the cholecystectomy. This may represent a seroma. A biloma or an abscess are not excluded. Clinical correlation is recommended. No retained calcified stone noted in the central CBD. 2. Colonic diverticulosis. Minimal stranding adjacent to the hepatic flexure, likely secondary to cholecystectomy and less likely acute cholecystitis. No bowel obstruction. Normal appendix. Aortic Atherosclerosis (ICD10-I70.0). Electronically Signed   By: Anner Crete M.D.   On: 10/18/2018 20:40    Dialysis Orders: Center: [facility]  on MWF. LUE AVF,  15g needles, Dialyzer 180NRe, BFR 400, DFR 800, T 4hr 24mn, EDW 90.5kg Heparin 2600 unit bolus Hectorol 10 mcg IV q HD Sensipar 60 mg daily Renvela 8030m3 tabs PO TID with meals  Assessment/Plan: 1.  N/V with elevated LFTs: S/p lap cholecystectomy on 10/04/2018. CT showed fluid collection along inferior aspect of liver on CT. On zosyn, plan for possible HIDA scan/ERCP per surgery notes. Will hold heparin with HD. Management per surgery/GI. 2.  ESRD:  Dialyzes MWF, last dialysis 9/23. No indication for emergent dialysis. Plan for HD later today per regular MWF schedule. K+ 3.9- will use 3K bath today. 3.  Hypertension/volume: BP moderately elevated, no evidence of volume overload on exam. Below EDW but has been losing weight  lately due to GI issues. Will plan for UFG 1-2L as tolerated today, will need lower EDW at discharge. 4.  Anemia: Not on ESA as outpatient, hemoglobin now <10. Will start aranesp with HD today. 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 5.  Metabolic bone disease: Resume renvela once no longer NPO. Corrected calcium 10.0. Will continue hectorol at reduced dose of 8 mcg, continue sensipar. Follow calcium.  6.  Nutrition:  Albumin 2.6. Currently NPO. 7. T2DM: A1c above goal at 8.4. Continue insulin management per primary team.   SaAnice PaganiniPA-C 10/19/2018, 8:28 AM  [facility] Pager: [phone] attending: Patient was seen and examined at dialysis unit.  Chart, lab and imaging studies reviewed.  I agree with the consult note including assessment and plan as outlined above.  89ear old male ESRD on HD recent lap chole admitted with nausea vomiting and abnormal liver function test.  GI and surgery already consulted.  Plan for HIDA scan.  Receiving dialysis today as per MWF schedule.  He is tolerating dialysis well.  Blood pressure acceptable.  UF goal 1 to 2 kg as tolerated.  DrLawson RadarMD [facility].

## 2018-10-20 LAB — COMPREHENSIVE METABOLIC PANEL
ALT: 495 U/L — ABNORMAL HIGH (ref 0–44)
AST: 251 U/L — ABNORMAL HIGH (ref 15–41)
Albumin: 2.5 g/dL — ABNORMAL LOW (ref 3.5–5.0)
Alkaline Phosphatase: 1214 U/L — ABNORMAL HIGH (ref 38–126)
Anion gap: 13 (ref 5–15)
BUN: 20 mg/dL (ref 6–20)
CO2: 27 mmol/L (ref 22–32)
Calcium: 9 mg/dL (ref 8.9–10.3)
Chloride: 93 mmol/L — ABNORMAL LOW (ref 98–111)
Creatinine, Ser: 5.24 mg/dL — ABNORMAL HIGH (ref 0.61–1.24)
GFR calc Af Amer: 13 mL/min — ABNORMAL LOW (ref >60)
GFR calc non Af Amer: 11 mL/min — ABNORMAL LOW (ref >60)
Glucose, Bld: 164 mg/dL — ABNORMAL HIGH (ref 70–99)
Potassium: 3.7 mmol/L (ref 3.5–5.1)
Sodium: 133 mmol/L — ABNORMAL LOW (ref 135–145)
Total Bilirubin: 3.6 mg/dL — ABNORMAL HIGH (ref 0.3–1.2)
Total Protein: 6.3 g/dL — ABNORMAL LOW (ref 6.5–8.1)

## 2018-10-20 LAB — GLUCOSE, CAPILLARY
Glucose-Capillary: 153 mg/dL — ABNORMAL HIGH (ref 70–99)
Glucose-Capillary: 172 mg/dL — ABNORMAL HIGH (ref 70–99)
Glucose-Capillary: 180 mg/dL — ABNORMAL HIGH (ref 70–99)
Glucose-Capillary: 196 mg/dL — ABNORMAL HIGH (ref 70–99)
Glucose-Capillary: 228 mg/dL — ABNORMAL HIGH (ref 70–99)
Glucose-Capillary: 260 mg/dL — ABNORMAL HIGH (ref 70–99)

## 2018-10-20 LAB — CBC
HCT: 31.1 % — ABNORMAL LOW (ref 39.0–52.0)
Hemoglobin: 10.5 g/dL — ABNORMAL LOW (ref 13.0–17.0)
MCH: 29.8 pg (ref 26.0–34.0)
MCHC: 33.8 g/dL (ref 30.0–36.0)
MCV: 88.4 fL (ref 80.0–100.0)
Platelets: 222 10*3/uL (ref 150–400)
RBC: 3.52 MIL/uL — ABNORMAL LOW (ref 4.22–5.81)
RDW: 13.5 % (ref 11.5–15.5)
WBC: 5.5 10*3/uL (ref 4.0–10.5)
nRBC: 0 % (ref 0.0–0.2)

## 2018-10-20 LAB — IRON AND TIBC
Iron: 55 ug/dL (ref 45–182)
Saturation Ratios: 23 % (ref 17.9–39.5)
TIBC: 241 ug/dL — ABNORMAL LOW (ref 250–450)
UIBC: 186 ug/dL

## 2018-10-20 LAB — FERRITIN: Ferritin: 1823 ng/mL — ABNORMAL HIGH (ref 24–336)

## 2018-10-20 LAB — GAMMA GT: GGT: 668 U/L — ABNORMAL HIGH (ref 7–50)

## 2018-10-20 MED ORDER — PRO-STAT SUGAR FREE PO LIQD
30.0000 mL | Freq: Two times a day (BID) | ORAL | Status: DC
  Administered 2018-10-20 – 2018-10-21 (×4): 30 mL via ORAL
  Filled 2018-10-20 (×3): qty 30

## 2018-10-20 NOTE — Progress Notes (Signed)
Patient ID: Edgar Brewer, male   DOB: 12/10/1960, 58 y.o.   MRN: [id number]  PROGRESS NOTE    Dmauri Rosenow  EVO:[id number] DOB: [birth year] DOA: 10/18/2018 PCP: System, Pcp Not In   Brief Narrative:  58 year old male with history of end-stage renal disease on hemodialysis, diabetes mellitus type 2, chronic combined systolic and diastolic heart failure, hypertension and recent admission for laparoscopic cholecystectomy on 10/04/2018, discharged on 10/06/2018 presented with nausea and vomiting.  He was found to have elevated LFTs.  CT abdomen and pelvis on presentation showed 3 x 2 cm fluid pocket along inferior surface of liver, may represent seroma.  Biloma or abscess not excluded.  Minor stranding at hepatic flexure, likely secondary to cholecystectomy and less likely acute diverticulitis.  GI and general surgery were consulted.  Nephrology was also consulted.  Patient was started on empiric antibiotics.  Assessment & Plan:   Nausea/vomiting/elevated LFTs Intra-abdominal fluid collection, seroma versus biloma or abscess in a patient with recent laparoscopic cholecystectomy with initial concern for bile leak -CT abdomen and pelvis on presentation showed 3 x 2 cm fluid pocket along inferior surface of liver, may represent seroma.  Biloma or abscess not excluded.  Minor stranding at hepatic flexure, likely secondary to cholecystectomy and less likely acute diverticulitis.  -GI and general surgery were consulted. -Patient was started on empiric antibiotics. -HIDA scan was negative for bile leak.  LFTs are still elevated but improving. -Follow further recommendations from GI and general surgery.  Will also follow-up with GI regarding the need for antibiotics.  End-stage renal disease on hemodialysis  -Nephrology following.  Dialysis as per nephrology schedule  Diabetes mellitus type II -CBGs with SSI  Generalized deconditioning -PT eval   DVT prophylaxis: SCDs Code Status: Full Family  Communication: None at bedside Disposition Plan: 1 to 2 days once cleared by GI/general surgery  Consultants: GI/general surgery/nephrology  Procedures: None  Antimicrobials: Zosyn   Subjective: Patient seen and examined at bedside.  He feels that his abdominal pain and nausea are better.  No overnight fever or vomiting.  Objective: Vitals:   10/19/18 1959 10/19/18 2000 10/20/18 0352 10/20/18 0921  BP: 138/64 138/64 (!) 148/73 (!) 146/80  Pulse: 95 93 89 87  Resp: 16 16 16 18   Temp: 98.9 F (37.2 C) 98.9 F (37.2 C) 98.2 F (36.8 C) 98.6 F (37 C)  TempSrc: Oral Oral Oral Oral  SpO2: 93% 93% 95% 94%  Weight:   87.1 kg   Height:        Intake/Output Summary (Last 24 hours) at 10/20/2018 1156 Last data filed at 10/20/2018 0900 Gross per 24 hour  Intake 581.76 ml  Output 2000 ml  Net -1418.24 ml   Filed Weights   10/19/18 0408 10/19/18 0928 10/20/18 0352  Weight: 88.5 kg 90.1 kg 87.1 kg    Examination:  General exam: Appears calm and comfortable  Respiratory system: Bilateral decreased breath sounds at bases with some scattered crackles Cardiovascular system: S1 & S2 heard, Rate controlled Gastrointestinal system: Abdomen is nondistended, soft and mild right upper quadrant tenderness. Normal bowel sounds heard. Extremities: No cyanosis, clubbing; trace lower extremity edema Central nervous system: Alert and oriented. No focal neurological deficits. Moving extremities   Data Reviewed: I have personally reviewed following labs and imaging studies  CBC: Recent Labs  Lab 10/18/18 1440 10/19/18 0321 10/20/18 0528  WBC 8.8 4.5 5.5  HGB 12.5* 9.7* 10.5*  HCT 36.5* 29.0* 31.1*  MCV 87.3 90.1 88.4  PLT 204 200 222  Basic Metabolic Panel: Recent Labs  Lab 10/18/18 1440 10/19/18 0321 10/20/18 0528  NA 134* 137 133*  K 4.9 3.6 3.7  CL 91* 94* 93*  CO2 29 32 27  GLUCOSE 331* 192* 164*  BUN 25* 28* 20  CREATININE 4.31* 4.87* 5.24*  CALCIUM 9.0 8.9 9.0    GFR: Estimated Creatinine Clearance: 16.3 mL/min (A) (by C-G formula based on SCr of 5.24 mg/dL (H)). Liver Function Tests: Recent Labs  Lab 10/18/18 1440 10/19/18 0321 10/20/18 0528  AST 499* 499* 251*  ALT 579* 592* 495*  ALKPHOS 1,404* 1,214* 1,214*  BILITOT 5.4* 4.0* 3.6*  PROT 6.6 6.0* 6.3*  ALBUMIN 3.0* 2.6* 2.5*   Recent Labs  Lab 10/18/18 1440 10/18/18 2358  LIPASE 57* 45   No results for input(s): AMMONIA in the last 168 hours. Coagulation Profile: Recent Labs  Lab 10/19/18 0321  INR 1.2   Cardiac Enzymes: No results for input(s): CKTOTAL, CKMB, CKMBINDEX, TROPONINI in the last 168 hours. BNP (last 3 results) No results for input(s): PROBNP in the last 8760 hours. HbA1C: Recent Labs    10/19/18 0321  HGBA1C 8.4*   CBG: Recent Labs  Lab 10/19/18 2157 10/19/18 2341 10/20/18 0352 10/20/18 0717 10/20/18 1113  GLUCAP 206* 198* 153* 172* 196*   Lipid Profile: No results for input(s): CHOL, HDL, LDLCALC, TRIG, CHOLHDL, LDLDIRECT in the last 72 hours. Thyroid Function Tests: No results for input(s): TSH, T4TOTAL, FREET4, T3FREE, THYROIDAB in the last 72 hours. Anemia Panel: Recent Labs    10/20/18 0528  FERRITIN 1,823*  TIBC 241*  IRON 55   Sepsis Labs: No results for input(s): PROCALCITON, LATICACIDVEN in the last 168 hours.  Recent Results (from the past 240 hour(s))  SARS CORONAVIRUS 2 (TAT 6-24 HRS) Nasopharyngeal Nasopharyngeal Swab     Status: None   Collection Time: 10/18/18  9:22 PM   Specimen: Nasopharyngeal Swab  Result Value Ref Range Status   SARS Coronavirus 2 NEGATIVE NEGATIVE Final    Comment: (NOTE) SARS-CoV-2 target nucleic acids are NOT DETECTED. The SARS-CoV-2 RNA is generally detectable in upper and lower respiratory specimens during the acute phase of infection. Negative results do not preclude SARS-CoV-2 infection, do not rule out co-infections with other pathogens, and should not be used as the sole basis for  treatment or other patient management decisions. Negative results must be combined with clinical observations, patient history, and epidemiological information. The expected result is Negative. Fact Sheet for Patients: SugarRoll.be Fact Sheet for Healthcare Providers: https://www.woods-mathews.com/ This test is not yet approved or cleared by the [place] FDA and  has been authorized for detection and/or diagnosis of SARS-CoV-2 by FDA under an Emergency Use Authorization (EUA). This EUA will remain  in effect (meaning this test can be used) for the duration of the COVID-19 declaration under Section 56 4(b)(1) of the Act, 21 U.S.C. section 360bbb-3(b)(1), unless the authorization is terminated or revoked sooner. Performed at [facility], [place] [address], [place], [place] [address]   Culture, blood (routine x 2)     Status: None (Preliminary result)   Collection Time: 10/18/18 11:59 PM   Specimen: BLOOD RIGHT HAND  Result Value Ref Range Status   Specimen Description BLOOD RIGHT HAND  Final   Special Requests AEROBIC BOTTLE ONLY Blood Culture adequate volume  Final   Culture   Final    NO GROWTH 1 DAY Performed at [facility], [place] [address], [place], [place] [address]    Report  Status PENDING  Incomplete  Culture, blood (routine x 2)     Status: None (Preliminary result)   Collection Time: 10/19/18 12:04 AM   Specimen: BLOOD RIGHT FOREARM  Result Value Ref Range Status   Specimen Description BLOOD RIGHT FOREARM  Final   Special Requests   Final    BOTTLES DRAWN AEROBIC AND ANAEROBIC Blood Culture adequate volume   Culture   Final    NO GROWTH 1 DAY Performed at [facility], [place] [address], [place], [place] [address]    Report Status PENDING  Incomplete         Radiology Studies: Ct Abdomen Pelvis W Contrast  Result Date: 10/18/2018 CLINICAL DATA:  58 year old male with nausea and vomiting. Concern for  retained stone. EXAM: CT ABDOMEN AND PELVIS WITH CONTRAST TECHNIQUE: Multidetector CT imaging of the abdomen and pelvis was performed using the standard protocol following bolus administration of intravenous contrast. CONTRAST:  112mL OMNIPAQUE IOHEXOL 300 MG/ML  SOLN COMPARISON:  CT of the abdomen pelvis dated 10/02/2018 FINDINGS: Lower chest: Bibasilar linear atelectasis/scarring. No intra-abdominal free air or free fluid. Hepatobiliary: Probable mild fatty infiltration of the liver. No intrahepatic biliary ductal dilatation. Cholecystectomy. There is a 3 x 2 cm fluid pocket along the inferior surface of the liver in the cholecystectomy. This may represent a seroma. A biloma or an abscess are not excluded. Clinical correlation is recommended. No retained calcified stone noted the central CBD. Pancreas: Unremarkable. No pancreatic ductal dilatation or surrounding inflammatory changes. Spleen: Mild splenomegaly measuring up to 15 cm length. Adrenals/Urinary Tract: The adrenal glands are unremarkable. There is no hydronephrosis on either side. There is symmetric enhancement and excretion of contrast by both kidneys. The visualized ureters and urinary bladder appear unremarkable. Stomach/Bowel: There is sigmoid diverticulosis and scattered colonic diverticula. Mild haziness adjacent to the hepatic flexure diverticula likely related to stranding from cholecystectomy and less likely related to diverticulitis. There is no bowel obstruction. The appendix is normal. Vascular/Lymphatic: Moderate aortoiliac atherosclerotic disease. IVC is unremarkable. No portal venous gas. There is no adenopathy. Reproductive: The prostate and seminal vesicles are grossly unremarkable. No pelvic mass. Other: None Musculoskeletal: Degenerative changes of the spine. L5-S1 disc desiccation and vacuum phenomena. No acute osseous pathology. IMPRESSION: 1. A 3 x 2 cm fluid pocket along the inferior surface of the liver in the cholecystectomy.  This may represent a seroma. A biloma or an abscess are not excluded. Clinical correlation is recommended. No retained calcified stone noted in the central CBD. 2. Colonic diverticulosis. Minimal stranding adjacent to the hepatic flexure, likely secondary to cholecystectomy and less likely acute cholecystitis. No bowel obstruction. Normal appendix. Aortic Atherosclerosis (ICD10-I70.0). Electronically Signed   By: Anner Crete M.D.   On: 10/18/2018 20:40   Nm Hepato Biliary Leak  Result Date: 10/19/2018 CLINICAL DATA:  Nausea and vomiting following cholecystectomy. Laparoscopic cholecystectomy on 10/04/2018. EXAM: NUCLEAR MEDICINE HEPATOBILIARY IMAGING TECHNIQUE: Sequential images of the abdomen were obtained out to 60 minutes following intravenous administration of radiopharmaceutical. RADIOPHARMACEUTICALS:  5.4 mCi Tc-22m  Choletec IV COMPARISON:  CT 10/18/2018 FINDINGS: Prompt clearance of radiotracer from the blood pool and homogeneous uptake in liver. There is slow transit of radiotracer from the hepatic parenchyma into the bile ducts. The common bile duct is only evident by 85 minutes. By 120 minutes small amount of radioactive bile does entered the small bowel. No evidence of extra hepatic accumulation of radiotracer to suggest bile leak. IMPRESSION: 1. No evidence of bile leak. 2. Poor  transient of radiotracer from the hepatic parenchyma suggest cholestasis versus hepatitis. 3. Patent common bile duct. Electronically Signed   By: Suzy Bouchard M.D.   On: 10/19/2018 17:41        Scheduled Meds: . Chlorhexidine Gluconate Cloth  6 each Topical Q0600  . cinacalcet  60 mg Oral Q supper  . darbepoetin (ARANESP) injection - DIALYSIS  25 mcg Intravenous Q Fri-HD  . doxercalciferol  8 mcg Intravenous Q M,W,F-HD  . insulin aspart  0-9 Units Subcutaneous Q4H  . sodium chloride flush  3 mL Intravenous Once  . sodium chloride flush  3 mL Intravenous Q12H   Continuous Infusions: . sodium  chloride    . piperacillin-tazobactam (ZOSYN)  IV 3.375 g (10/20/18 0542)     LOS: 1 day        Aline August, MD [facility] 10/20/2018, 11:56 AM

## 2018-10-20 NOTE — Progress Notes (Addendum)
[facility] Progress Note   Subjective:   Patient seen in room. Reports some gas pains, otherwise no concerns. No N/V/D, CP, palpitations, SOB, cough or edema. Reports HD went well yesterday, had net UF 2L.  Objective Vitals:   10/19/18 1959 10/19/18 2000 10/20/18 0352 10/20/18 0921  BP: 138/64 138/64 (!) 148/73 (!) 146/80  Pulse: 95 93 89 87  Resp: 16 16 16 18   Temp: 98.9 F (37.2 C) 98.9 F (37.2 C) 98.2 F (36.8 C) 98.6 F (37 C)  TempSrc: Oral Oral Oral Oral  SpO2: 93% 93% 95% 94%  Weight:   87.1 kg   Height:       Physical Exam General: Well developed, alert male in NAD Heart: RRR no murmurs, rubs or gallops Lungs: CTA bilaterally without wheezing, rhonchi or rales Abdomen: Soft, non-tender, non-distended. +BS Extremities: No peripheral edema Dialysis Access:  LUE AVF + bruit  Additional Objective Labs: Basic Metabolic Panel: Recent Labs  Lab 10/18/18 1440 10/19/18 0321 10/20/18 0528  NA 134* 137 133*  K 4.9 3.6 3.7  CL 91* 94* 93*  CO2 29 32 27  GLUCOSE 331* 192* 164*  BUN 25* 28* 20  CREATININE 4.31* 4.87* 5.24*  CALCIUM 9.0 8.9 9.0   Liver Function Tests: Recent Labs  Lab 10/18/18 1440 10/19/18 0321 10/20/18 0528  AST 499* 499* 251*  ALT 579* 592* 495*  ALKPHOS 1,404* 1,214* 1,214*  BILITOT 5.4* 4.0* 3.6*  PROT 6.6 6.0* 6.3*  ALBUMIN 3.0* 2.6* 2.5*   Recent Labs  Lab 10/18/18 1440 10/18/18 2358  LIPASE 57* 45   CBC: Recent Labs  Lab 10/18/18 1440 10/19/18 0321 10/20/18 0528  WBC 8.8 4.5 5.5  HGB 12.5* 9.7* 10.5*  HCT 36.5* 29.0* 31.1*  MCV 87.3 90.1 88.4  PLT 204 200 222   Blood Culture    Component Value Date/Time   SDES BLOOD RIGHT FOREARM 10/19/2018 0004   SPECREQUEST  10/19/2018 0004    BOTTLES DRAWN AEROBIC AND ANAEROBIC Blood Culture adequate volume   CULT  10/19/2018 0004    NO GROWTH 1 DAY Performed at [facility], [place] [address], [place], [place] [address]    REPTSTATUS PENDING 10/19/2018  0004    CBG: Recent Labs  Lab 10/19/18 2157 10/19/18 2341 10/20/18 0352 10/20/18 0717 10/20/18 1113  GLUCAP 206* 198* 153* 172* 196*   Iron Studies:  Recent Labs    10/20/18 0528  IRON 55  TIBC 241*  FERRITIN 1,823*   @[email or web address] @ Studies/Results: Ct Abdomen Pelvis W Contrast  Result Date: 10/18/2018 CLINICAL DATA:  58 year old male with nausea and vomiting. Concern for retained stone. EXAM: CT ABDOMEN AND PELVIS WITH CONTRAST TECHNIQUE: Multidetector CT imaging of the abdomen and pelvis was performed using the standard protocol following bolus administration of intravenous contrast. CONTRAST:  176mL OMNIPAQUE IOHEXOL 300 MG/ML  SOLN COMPARISON:  CT of the abdomen pelvis dated 10/02/2018 FINDINGS: Lower chest: Bibasilar linear atelectasis/scarring. No intra-abdominal free air or free fluid. Hepatobiliary: Probable mild fatty infiltration of the liver. No intrahepatic biliary ductal dilatation. Cholecystectomy. There is a 3 x 2 cm fluid pocket along the inferior surface of the liver in the cholecystectomy. This may represent a seroma. A biloma or an abscess are not excluded. Clinical correlation is recommended. No retained calcified stone noted the central CBD. Pancreas: Unremarkable. No pancreatic ductal dilatation or surrounding inflammatory changes. Spleen: Mild splenomegaly measuring up to 15 cm length. Adrenals/Urinary Tract: The adrenal glands are unremarkable. There is no hydronephrosis on  either side. There is symmetric enhancement and excretion of contrast by both kidneys. The visualized ureters and urinary bladder appear unremarkable. Stomach/Bowel: There is sigmoid diverticulosis and scattered colonic diverticula. Mild haziness adjacent to the hepatic flexure diverticula likely related to stranding from cholecystectomy and less likely related to diverticulitis. There is no bowel obstruction. The appendix is normal. Vascular/Lymphatic: Moderate aortoiliac atherosclerotic disease.  IVC is unremarkable. No portal venous gas. There is no adenopathy. Reproductive: The prostate and seminal vesicles are grossly unremarkable. No pelvic mass. Other: None Musculoskeletal: Degenerative changes of the spine. L5-S1 disc desiccation and vacuum phenomena. No acute osseous pathology. IMPRESSION: 1. A 3 x 2 cm fluid pocket along the inferior surface of the liver in the cholecystectomy. This may represent a seroma. A biloma or an abscess are not excluded. Clinical correlation is recommended. No retained calcified stone noted in the central CBD. 2. Colonic diverticulosis. Minimal stranding adjacent to the hepatic flexure, likely secondary to cholecystectomy and less likely acute cholecystitis. No bowel obstruction. Normal appendix. Aortic Atherosclerosis (ICD10-I70.0). Electronically Signed   By: Anner Crete M.D.   On: 10/18/2018 20:40   Nm Hepato Biliary Leak  Result Date: 10/19/2018 CLINICAL DATA:  Nausea and vomiting following cholecystectomy. Laparoscopic cholecystectomy on 10/04/2018. EXAM: NUCLEAR MEDICINE HEPATOBILIARY IMAGING TECHNIQUE: Sequential images of the abdomen were obtained out to 60 minutes following intravenous administration of radiopharmaceutical. RADIOPHARMACEUTICALS:  5.4 mCi Tc-51m  Choletec IV COMPARISON:  CT 10/18/2018 FINDINGS: Prompt clearance of radiotracer from the blood pool and homogeneous uptake in liver. There is slow transit of radiotracer from the hepatic parenchyma into the bile ducts. The common bile duct is only evident by 85 minutes. By 120 minutes small amount of radioactive bile does entered the small bowel. No evidence of extra hepatic accumulation of radiotracer to suggest bile leak. IMPRESSION: 1. No evidence of bile leak. 2. Poor transient of radiotracer from the hepatic parenchyma suggest cholestasis versus hepatitis. 3. Patent common bile duct. Electronically Signed   By: Suzy Bouchard M.D.   On: 10/19/2018 17:41   Medications: . sodium chloride     . piperacillin-tazobactam (ZOSYN)  IV 3.375 g (10/20/18 0542)   . Chlorhexidine Gluconate Cloth  6 each Topical Q0600  . cinacalcet  60 mg Oral Q supper  . darbepoetin (ARANESP) injection - DIALYSIS  25 mcg Intravenous Q Fri-HD  . doxercalciferol  8 mcg Intravenous Q M,W,F-HD  . insulin aspart  0-9 Units Subcutaneous Q4H  . sodium chloride flush  3 mL Intravenous Once  . sodium chloride flush  3 mL Intravenous Q12H    Dialysis Orders: Center: [facility]  on MWF. LUE AVF,  15g needles, Dialyzer 180NRe, BFR 400, DFR 800, T 4hr 29min, EDW 90.5kg Heparin 2600 unit bolus Hectorol 10 mcg IV q HD Sensipar 60 mg daily Renvela 800mg  3 tabs PO TID with meals  Assessment/Plan: 1.  N/V with elevated LFTs: S/p lap cholecystectomy on 10/04/2018. CT showed fluid collection along inferior aspect of liver on CT. On zosyn. HIDA scan negative yesterday other liver serologies are pending, LFTs are trending down today. Denies any further fevers, nausea or vomiting. Management per surgery/GI. 2.  ESRD:  MWF at [facility]. Uneventful HD yesterday with UF 2L. K+ 3.7. Plan for next HD on Monday, 9/28.  3.  Hypertension/volume: No evidence of volume overload on exam. Below EDW but has been losing weight lately due to GI issues. Will need lower EDW at discharge. 4.  Anemia: Not on ESA as  outpatient. Given aranesp with HD yesterday. Hgb now 10.5.  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 5.  Metabolic bone disease: Resume renvela once no longer NPO. Corrected calcium 10.2. Will continue hectorol at reduced dose of 8 mcg, continue sensipar. Follow calcium.  6.  Nutrition:  Albumin 2.5. Will start pro-stat supplement.  7. T2DM: A1c above goal at 8.4. Continue insulin management per primary team.   Anice Paganini, PA-C 10/20/2018, 12:45 PM  [facility] Pager: [phone]  Pt seen, examined and agree w A/P as  above.  Kelly Splinter  MD 10/20/2018, 2:12 PM

## 2018-10-20 NOTE — Progress Notes (Addendum)
Progress Note   Subjective  Chief Complaint: Elevated LFTs  Patient states he is doing well today.  No abdominal pain other than "gas pains that come and go".  No new complaints.   Review of systems: No apparent chest pain dyspnea or dysuria.  It should be noted he is a limited historian.  Objective   Vital signs in last 24 hours: Temp:  [98.2 F (36.8 C)-99 F (37.2 C)] 98.6 F (37 C) (09/26 0921) Pulse Rate:  [78-95] 87 (09/26 0921) Resp:  [16-24] 18 (09/26 0921) BP: (114-155)/(61-80) 146/80 (09/26 0921) SpO2:  [93 %-97 %] 94 % (09/26 0921) Weight:  [87.1 kg] 87.1 kg (09/26 0352) Last BM Date: 10/18/18 General:    white male in NAD Heart:  Regular rate and rhythm; no murmurs Lungs: Respirations even and unlabored, lungs CTA bilaterally Abdomen:  Soft, nontender and nondistended. Normal bowel sounds. Neurologic:  Alert and oriented,  grossly normal neurologically. Psych:  Cooperative. Normal mood and affect. Fistula left upper extremity Intake/Output from previous day: 09/25 0701 - 09/26 0700 In: 341.8 [P.O.:240; IV Piggyback:101.8] Out: 2000   Lab Results: Recent Labs    10/18/18 1440 10/19/18 0321 10/20/18 0528  WBC 8.8 4.5 5.5  HGB 12.5* 9.7* 10.5*  HCT 36.5* 29.0* 31.1*  PLT 204 200 222   BMET Recent Labs    10/18/18 1440 10/19/18 0321 10/20/18 0528  NA 134* 137 133*  K 4.9 3.6 3.7  CL 91* 94* 93*  CO2 29 32 27  GLUCOSE 331* 192* 164*  BUN 25* 28* 20  CREATININE 4.31* 4.87* 5.24*  CALCIUM 9.0 8.9 9.0   Hepatic Function Latest Ref Rng & Units 10/20/2018 10/19/2018 10/18/2018  Total Protein 6.5 - 8.1 g/dL 6.3(L) 6.0(L) 6.6  Albumin 3.5 - 5.0 g/dL 2.5(L) 2.6(L) 3.0(L)  AST 15 - 41 U/L 251(H) 499(H) 499(H)  ALT 0 - 44 U/L 495(H) 592(H) 579(H)  Alk Phosphatase 38 - 126 U/L 1,214(H) 1,214(H) 1,404(H)  Total Bilirubin 0.3 - 1.2 mg/dL 3.6(H) 4.0(H) 5.4(H)  Bilirubin, Direct 0.00 - 0.40 mg/dL - - -   GGT over 600  Negative acute viral hepatitis  panel.  PT/INR Recent Labs    10/19/18 0321  LABPROT 14.9  INR 1.2    Studies/Results: Ct Abdomen Pelvis W Contrast  Result Date: 10/18/2018 CLINICAL DATA:  58 year old male with nausea and vomiting. Concern for retained stone. EXAM: CT ABDOMEN AND PELVIS WITH CONTRAST TECHNIQUE: Multidetector CT imaging of the abdomen and pelvis was performed using the standard protocol following bolus administration of intravenous contrast. CONTRAST:  126m OMNIPAQUE IOHEXOL 300 MG/ML  SOLN COMPARISON:  CT of the abdomen pelvis dated 10/02/2018 FINDINGS: Lower chest: Bibasilar linear atelectasis/scarring. No intra-abdominal free air or free fluid. Hepatobiliary: Probable mild fatty infiltration of the liver. No intrahepatic biliary ductal dilatation. Cholecystectomy. There is a 3 x 2 cm fluid pocket along the inferior surface of the liver in the cholecystectomy. This may represent a seroma. A biloma or an abscess are not excluded. Clinical correlation is recommended. No retained calcified stone noted the central CBD. Pancreas: Unremarkable. No pancreatic ductal dilatation or surrounding inflammatory changes. Spleen: Mild splenomegaly measuring up to 15 cm length. Adrenals/Urinary Tract: The adrenal glands are unremarkable. There is no hydronephrosis on either side. There is symmetric enhancement and excretion of contrast by both kidneys. The visualized ureters and urinary bladder appear unremarkable. Stomach/Bowel: There is sigmoid diverticulosis and scattered colonic diverticula. Mild haziness adjacent to the hepatic flexure diverticula likely related  to stranding from cholecystectomy and less likely related to diverticulitis. There is no bowel obstruction. The appendix is normal. Vascular/Lymphatic: Moderate aortoiliac atherosclerotic disease. IVC is unremarkable. No portal venous gas. There is no adenopathy. Reproductive: The prostate and seminal vesicles are grossly unremarkable. No pelvic mass. Other: None  Musculoskeletal: Degenerative changes of the spine. L5-S1 disc desiccation and vacuum phenomena. No acute osseous pathology. IMPRESSION: 1. A 3 x 2 cm fluid pocket along the inferior surface of the liver in the cholecystectomy. This may represent a seroma. A biloma or an abscess are not excluded. Clinical correlation is recommended. No retained calcified stone noted in the central CBD. 2. Colonic diverticulosis. Minimal stranding adjacent to the hepatic flexure, likely secondary to cholecystectomy and less likely acute cholecystitis. No bowel obstruction. Normal appendix. Aortic Atherosclerosis (ICD10-I70.0). Electronically Signed   By: Anner Crete M.D.   On: 10/18/2018 20:40   Nm Hepato Biliary Leak  Result Date: 10/19/2018 CLINICAL DATA:  Nausea and vomiting following cholecystectomy. Laparoscopic cholecystectomy on 10/04/2018. EXAM: NUCLEAR MEDICINE HEPATOBILIARY IMAGING TECHNIQUE: Sequential images of the abdomen were obtained out to 60 minutes following intravenous administration of radiopharmaceutical. RADIOPHARMACEUTICALS:  5.4 mCi Tc-65m Choletec IV COMPARISON:  CT 10/18/2018 FINDINGS: Prompt clearance of radiotracer from the blood pool and homogeneous uptake in liver. There is slow transit of radiotracer from the hepatic parenchyma into the bile ducts. The common bile duct is only evident by 85 minutes. By 120 minutes small amount of radioactive bile does entered the small bowel. No evidence of extra hepatic accumulation of radiotracer to suggest bile leak. IMPRESSION: 1. No evidence of bile leak. 2. Poor transient of radiotracer from the hepatic parenchyma suggest cholestasis versus hepatitis. 3. Patent common bile duct. Electronically Signed   By: SSuzy BouchardM.D.   On: 10/19/2018 17:41       Assessment / Plan:   Assessment: 1.  Elevated LFTs: Initial concern for bile leak given patient is a little over a week out from unremarkable lap chole, HIDA scan negative yesterday other liver  serologies are pending, LFTs are trending down today 2.  ESRD on HD 3.  Normocytic anemia  Plan: 1.  Awaiting liver labs including hepatitis serologies and autoimmune liver disease serologies as well as iron studies 2.  Discussed with the patient that there is no need for ERCP at this time 3.  Please await any further recommendations from Dr. DLoletha Carrowlater today  Thank you for kind consultation.  We will continue to follow.    LOS: 1 day   JLevin Erp 10/20/2018, 10:03 AM  I have discussed the case with the PA, and that is the plan I formulated. I personally interviewed and examined the patient.  Clinically unchanged from yesterday.  He does not have abdominal pain or tenderness, his LFTs remain about the same, and I have also discussed the case with our earlier endoscopist on-call.  There is general agreement at this does not seem typical for an extrahepatic obstructive process given the persistently normal biliary diameter in the pattern of LFT elevation. There was initial concern this may somehow be related to his surgery, but that no longer appears to be the case.  There is no bile leak, and this is not typical for choledocholithiasis.  The degree of alkaline phosphatase elevation is out of proportion to that of the bilirubin, even accounting for his chronic alkaline phosphatase elevation.  Follow LFTs daily  I will consult with colleagues, I think liver biopsy may be  the next step, depending upon results of serologies that are pending.  Total time 25 minutes.  Nelida Meuse III Office: [phone]

## 2018-10-20 NOTE — Progress Notes (Signed)
Assessment & Plan:  S/p lap chole 10/04/18 Dr. Kieth Brightly  Post op 3 x 2 cm collection in gallbladder bed transaminitis and elevated bilirubin - appreciate GI consultation and thorough evaluation by Dr. Loletha Carrow - see notes - HIDA negative for bile leak or obstruction - may indicate intrinsic liver disease - slight improvement in labs this AM  Agree that laboratory abnormalities may be medical in origin and not related to recent surgery.  Will continue to follow with you.        Armandina Gemma, MD       [name]       Office: [phone]   Chief Complaint: Abdominal pain, abnormal liver enzymes  Subjective: Patient in bed.  Feels better.  No specific complaints.  Tolerating diet.  Objective: Vital signs in last 24 hours: Temp:  [98.2 F (36.8 C)-99 F (37.2 C)] 98.6 F (37 C) (09/26 0921) Pulse Rate:  [78-95] 87 (09/26 0921) Resp:  [16-24] 18 (09/26 0921) BP: (114-155)/(61-80) 146/80 (09/26 0921) SpO2:  [93 %-97 %] 94 % (09/26 0921) Weight:  [87.1 kg] 87.1 kg (09/26 0352) Last BM Date: 10/18/18  Intake/Output from previous day: 09/25 0701 - 09/26 0700 In: 341.8 [P.O.:240; IV Piggyback:101.8] Out: 2000  Intake/Output this shift: No intake/output data recorded.  Physical Exam: HEENT - sclerae clear, mucous membranes moist Neck - soft Chest - clear bilaterally Cor - RRR Abdomen - soft, protuberant; surgical wounds dry and intact; minimal tenderness, no mass Ext - no edema, non-tender Neuro - alert & oriented, no focal deficits  Lab Results:  Recent Labs    10/19/18 0321 10/20/18 0528  WBC 4.5 5.5  HGB 9.7* 10.5*  HCT 29.0* 31.1*  PLT 200 222   BMET Recent Labs    10/19/18 0321 10/20/18 0528  NA 137 133*  K 3.6 3.7  CL 94* 93*  CO2 32 27  GLUCOSE 192* 164*  BUN 28* 20  CREATININE 4.87* 5.24*  CALCIUM 8.9 9.0   PT/INR Recent Labs    10/19/18 0321  LABPROT 14.9  INR 1.2   Comprehensive Metabolic Panel:    Component  Value Date/Time   NA 133 (L) 10/20/2018 0528   NA 137 10/19/2018 0321   NA 140 03/04/2016 1317   NA 143 02/29/2016 1439   K 3.7 10/20/2018 0528   K 3.6 10/19/2018 0321   CL 93 (L) 10/20/2018 0528   CL 94 (L) 10/19/2018 0321   CO2 27 10/20/2018 0528   CO2 32 10/19/2018 0321   BUN 20 10/20/2018 0528   BUN 28 (H) 10/19/2018 0321   BUN 47 (H) 03/04/2016 1317   BUN 43 (H) 02/29/2016 1439   CREATININE 5.24 (H) 10/20/2018 0528   CREATININE 4.87 (H) 10/19/2018 0321   GLUCOSE 164 (H) 10/20/2018 0528   GLUCOSE 192 (H) 10/19/2018 0321   CALCIUM 9.0 10/20/2018 0528   CALCIUM 8.9 10/19/2018 0321   AST 251 (H) 10/20/2018 0528   AST 499 (H) 10/19/2018 0321   ALT 495 (H) 10/20/2018 0528   ALT 592 (H) 10/19/2018 0321   ALKPHOS 1,214 (H) 10/20/2018 0528   ALKPHOS 1,214 (H) 10/19/2018 0321   BILITOT 3.6 (H) 10/20/2018 0528   BILITOT 4.0 (H) 10/19/2018 0321   BILITOT 0.2 02/29/2016 1439   PROT 6.3 (L) 10/20/2018 0528   PROT 6.0 (L) 10/19/2018 0321   PROT 5.2 (L) 02/29/2016 1439   ALBUMIN 2.5 (L) 10/20/2018 0528   ALBUMIN 2.6 (L) 10/19/2018 0321  ALBUMIN 3.1 (L) 02/29/2016 1439    Studies/Results: Ct Abdomen Pelvis W Contrast  Result Date: 10/18/2018 CLINICAL DATA:  58 year old male with nausea and vomiting. Concern for retained stone. EXAM: CT ABDOMEN AND PELVIS WITH CONTRAST TECHNIQUE: Multidetector CT imaging of the abdomen and pelvis was performed using the standard protocol following bolus administration of intravenous contrast. CONTRAST:  149mL OMNIPAQUE IOHEXOL 300 MG/ML  SOLN COMPARISON:  CT of the abdomen pelvis dated 10/02/2018 FINDINGS: Lower chest: Bibasilar linear atelectasis/scarring. No intra-abdominal free air or free fluid. Hepatobiliary: Probable mild fatty infiltration of the liver. No intrahepatic biliary ductal dilatation. Cholecystectomy. There is a 3 x 2 cm fluid pocket along the inferior surface of the liver in the cholecystectomy. This may represent a seroma. A biloma or  an abscess are not excluded. Clinical correlation is recommended. No retained calcified stone noted the central CBD. Pancreas: Unremarkable. No pancreatic ductal dilatation or surrounding inflammatory changes. Spleen: Mild splenomegaly measuring up to 15 cm length. Adrenals/Urinary Tract: The adrenal glands are unremarkable. There is no hydronephrosis on either side. There is symmetric enhancement and excretion of contrast by both kidneys. The visualized ureters and urinary bladder appear unremarkable. Stomach/Bowel: There is sigmoid diverticulosis and scattered colonic diverticula. Mild haziness adjacent to the hepatic flexure diverticula likely related to stranding from cholecystectomy and less likely related to diverticulitis. There is no bowel obstruction. The appendix is normal. Vascular/Lymphatic: Moderate aortoiliac atherosclerotic disease. IVC is unremarkable. No portal venous gas. There is no adenopathy. Reproductive: The prostate and seminal vesicles are grossly unremarkable. No pelvic mass. Other: None Musculoskeletal: Degenerative changes of the spine. L5-S1 disc desiccation and vacuum phenomena. No acute osseous pathology. IMPRESSION: 1. A 3 x 2 cm fluid pocket along the inferior surface of the liver in the cholecystectomy. This may represent a seroma. A biloma or an abscess are not excluded. Clinical correlation is recommended. No retained calcified stone noted in the central CBD. 2. Colonic diverticulosis. Minimal stranding adjacent to the hepatic flexure, likely secondary to cholecystectomy and less likely acute cholecystitis. No bowel obstruction. Normal appendix. Aortic Atherosclerosis (ICD10-I70.0). Electronically Signed   By: Anner Crete M.D.   On: 10/18/2018 20:40   Nm Hepato Biliary Leak  Result Date: 10/19/2018 CLINICAL DATA:  Nausea and vomiting following cholecystectomy. Laparoscopic cholecystectomy on 10/04/2018. EXAM: NUCLEAR MEDICINE HEPATOBILIARY IMAGING TECHNIQUE: Sequential  images of the abdomen were obtained out to 60 minutes following intravenous administration of radiopharmaceutical. RADIOPHARMACEUTICALS:  5.4 mCi Tc-55m  Choletec IV COMPARISON:  CT 10/18/2018 FINDINGS: Prompt clearance of radiotracer from the blood pool and homogeneous uptake in liver. There is slow transit of radiotracer from the hepatic parenchyma into the bile ducts. The common bile duct is only evident by 85 minutes. By 120 minutes small amount of radioactive bile does entered the small bowel. No evidence of extra hepatic accumulation of radiotracer to suggest bile leak. IMPRESSION: 1. No evidence of bile leak. 2. Poor transient of radiotracer from the hepatic parenchyma suggest cholestasis versus hepatitis. 3. Patent common bile duct. Electronically Signed   By: Suzy Bouchard M.D.   On: 10/19/2018 17:41      Armandina Gemma 10/20/2018  Patient ID: Edgar Brewer, male   DOB: [birth year], 59 y.o.   MRN: [id number]

## 2018-10-21 LAB — COMPREHENSIVE METABOLIC PANEL
ALT: 334 U/L — ABNORMAL HIGH (ref 0–44)
AST: 141 U/L — ABNORMAL HIGH (ref 15–41)
Albumin: 2.5 g/dL — ABNORMAL LOW (ref 3.5–5.0)
Alkaline Phosphatase: 1138 U/L — ABNORMAL HIGH (ref 38–126)
Anion gap: 16 — ABNORMAL HIGH (ref 5–15)
BUN: 36 mg/dL — ABNORMAL HIGH (ref 6–20)
CO2: 26 mmol/L (ref 22–32)
Calcium: 8.8 mg/dL — ABNORMAL LOW (ref 8.9–10.3)
Chloride: 93 mmol/L — ABNORMAL LOW (ref 98–111)
Creatinine, Ser: 8.47 mg/dL — ABNORMAL HIGH (ref 0.61–1.24)
GFR calc Af Amer: 7 mL/min — ABNORMAL LOW (ref >60)
GFR calc non Af Amer: 6 mL/min — ABNORMAL LOW (ref >60)
Glucose, Bld: 195 mg/dL — ABNORMAL HIGH (ref 70–99)
Potassium: 3.6 mmol/L (ref 3.5–5.1)
Sodium: 135 mmol/L (ref 135–145)
Total Bilirubin: 2.2 mg/dL — ABNORMAL HIGH (ref 0.3–1.2)
Total Protein: 6.5 g/dL (ref 6.5–8.1)

## 2018-10-21 LAB — CBC WITH DIFFERENTIAL/PLATELET
Abs Immature Granulocytes: 0.09 10*3/uL — ABNORMAL HIGH (ref 0.00–0.07)
Basophils Absolute: 0.1 10*3/uL (ref 0.0–0.1)
Basophils Relative: 1 %
Eosinophils Absolute: 0.1 10*3/uL (ref 0.0–0.5)
Eosinophils Relative: 3 %
HCT: 31.2 % — ABNORMAL LOW (ref 39.0–52.0)
Hemoglobin: 10.7 g/dL — ABNORMAL LOW (ref 13.0–17.0)
Immature Granulocytes: 2 %
Lymphocytes Relative: 14 %
Lymphs Abs: 0.7 10*3/uL (ref 0.7–4.0)
MCH: 30.1 pg (ref 26.0–34.0)
MCHC: 34.3 g/dL (ref 30.0–36.0)
MCV: 87.6 fL (ref 80.0–100.0)
Monocytes Absolute: 0.4 10*3/uL (ref 0.1–1.0)
Monocytes Relative: 8 %
Neutro Abs: 3.4 10*3/uL (ref 1.7–7.7)
Neutrophils Relative %: 72 %
Platelets: 234 10*3/uL (ref 150–400)
RBC: 3.56 MIL/uL — ABNORMAL LOW (ref 4.22–5.81)
RDW: 13.7 % (ref 11.5–15.5)
WBC: 4.7 10*3/uL (ref 4.0–10.5)
nRBC: 0 % (ref 0.0–0.2)

## 2018-10-21 LAB — GLUCOSE, CAPILLARY
Glucose-Capillary: 190 mg/dL — ABNORMAL HIGH (ref 70–99)
Glucose-Capillary: 180 mg/dL — ABNORMAL HIGH (ref 70–99)
Glucose-Capillary: 192 mg/dL — ABNORMAL HIGH (ref 70–99)
Glucose-Capillary: 194 mg/dL — ABNORMAL HIGH (ref 70–99)
Glucose-Capillary: 178 mg/dL — ABNORMAL HIGH (ref 70–99)

## 2018-10-21 MED ORDER — TRAMADOL HCL 50 MG PO TABS
50.0000 mg | ORAL_TABLET | Freq: Two times a day (BID) | ORAL | Status: DC | PRN
  Administered 2018-10-21: 50 mg via ORAL
  Filled 2018-10-21: qty 1

## 2018-10-21 MED ORDER — FENTANYL CITRATE (PF) 100 MCG/2ML IJ SOLN: 25.0000 ug | INTRAMUSCULAR | Status: DC | PRN

## 2018-10-21 MED ORDER — SEVELAMER CARBONATE 800 MG PO TABS
1600.0000 mg | ORAL_TABLET | Freq: Three times a day (TID) | ORAL | Status: DC
  Administered 2018-10-21 – 2018-10-22 (×2): 1600 mg via ORAL
  Filled 2018-10-21 (×2): qty 2

## 2018-10-21 NOTE — Plan of Care (Signed)
  Problem: Education: Goal: Knowledge of General Education information will improve Description Including pain rating scale, medication(s)/side effects and non-pharmacologic comfort measures Outcome: Progressing   

## 2018-10-21 NOTE — Progress Notes (Signed)
Patient ID: Edgar Brewer, male   DOB: Mar 05, 1960, 58 y.o.   MRN: [id number]  PROGRESS NOTE    Edgar Brewer  JIR:[id number] DOB: 1960-09-21 DOA: 10/18/2018 PCP: System, Pcp Not In   Brief Narrative:  58 year old male with history of end-stage renal disease on hemodialysis, diabetes mellitus type 2, chronic combined systolic and diastolic heart failure, hypertension and recent admission for laparoscopic cholecystectomy on 10/04/2018, discharged on 10/06/2018 presented with nausea and vomiting.  He was found to have elevated LFTs.  CT abdomen and pelvis on presentation showed 3 x 2 cm fluid pocket along inferior surface of liver, may represent seroma.  Biloma or abscess not excluded.  Minor stranding at hepatic flexure, likely secondary to cholecystectomy and less likely acute diverticulitis.  GI and general surgery were consulted.  Nephrology was also consulted.  Patient was started on empiric antibiotics.  Assessment & Plan:   Nausea/vomiting/elevated LFTs Intra-abdominal fluid collection, seroma versus biloma or abscess in a patient with recent laparoscopic cholecystectomy with initial concern for bile leak -CT abdomen and pelvis on presentation showed 3 x 2 cm fluid pocket along inferior surface of liver, may represent seroma.  Biloma or abscess not excluded.  Minor stranding at hepatic flexure, likely secondary to cholecystectomy and less likely acute diverticulitis.  -GI and general surgery were consulted. -Patient was started on empiric antibiotics. -HIDA scan was negative for bile leak.  LFTs are still elevated but improving. -Follow further recommendations from GI and general surgery.  Will DC antibiotics if okay with GI. -Nausea and vomiting improving.  Tolerating diet.  End-stage renal disease on hemodialysis  -Nephrology following.  Dialysis as per nephrology schedule  Diabetes mellitus type II -CBGs with SSI  Generalized deconditioning -PT eval   DVT prophylaxis: SCDs Code  Status: Full Family Communication: None at bedside Disposition Plan: 1 to 2 days once cleared by GI/general surgery  Consultants: GI/general surgery/nephrology  Procedures: None  Antimicrobials: Zosyn   Subjective: Patient seen and examined at bedside.  Denies overnight fever, nausea or vomiting.  States that his abdominal pain is improving. Objective: Vitals:   10/20/18 0921 10/20/18 1700 10/20/18 1950 10/21/18 0506  BP: (!) 146/80 (!) 141/72 (!) 154/83 (!) 168/84  Pulse: 87 85 88 84  Resp: 18 18 16 16   Temp: 98.6 F (37 C) 98.6 F (37 C) 98.4 F (36.9 C) 98.7 F (37.1 C)  TempSrc: Oral Oral Oral Oral  SpO2: 94% 98% 96% 95%  Weight:    86.8 kg  Height:        Intake/Output Summary (Last 24 hours) at 10/21/2018 0748 Last data filed at 10/21/2018 0600 Gross per 24 hour  Intake 1053.86 ml  Output 0 ml  Net 1053.86 ml   Filed Weights   10/19/18 0928 10/20/18 0352 10/21/18 0506  Weight: 90.1 kg 87.1 kg 86.8 kg    Examination:  General exam: No acute distress.   Respiratory system: Bilateral decreased breath sounds at bases, no wheezing.  Some crackles.   Cardiovascular system: Rate controlled, S1-S2 heard Gastrointestinal system: Abdomen is nondistended, soft and slightly tender right upper quadrant. Normal bowel sounds heard. Extremities: No cyanosis; trace lower extremity edema   Data Reviewed: I have personally reviewed following labs and imaging studies  CBC: Recent Labs  Lab 10/18/18 1440 10/19/18 0321 10/20/18 0528 10/21/18 0617  WBC 8.8 4.5 5.5 4.7  NEUTROABS  --   --   --  3.4  HGB 12.5* 9.7* 10.5* 10.7*  HCT 36.5* 29.0* 31.1* 31.2*  MCV  87.3 90.1 88.4 87.6  PLT 204 200 222 518   Basic Metabolic Panel: Recent Labs  Lab 10/18/18 1440 10/19/18 0321 10/20/18 0528 10/21/18 0617  NA 134* 137 133* 135  K 4.9 3.6 3.7 3.6  CL 91* 94* 93* 93*  CO2 29 32 27 26  GLUCOSE 331* 192* 164* 195*  BUN 25* 28* 20 36*  CREATININE 4.31* 4.87* 5.24* 8.47*   CALCIUM 9.0 8.9 9.0 8.8*   GFR: Estimated Creatinine Clearance: 10.1 mL/min (A) (by C-G formula based on SCr of 8.47 mg/dL (H)). Liver Function Tests: Recent Labs  Lab 10/18/18 1440 10/19/18 0321 10/20/18 0528 10/21/18 0617  AST 499* 499* 251* 141*  ALT 579* 592* 495* 334*  ALKPHOS 1,404* 1,214* 1,214* 1,138*  BILITOT 5.4* 4.0* 3.6* 2.2*  PROT 6.6 6.0* 6.3* 6.5  ALBUMIN 3.0* 2.6* 2.5* 2.5*   Recent Labs  Lab 10/18/18 1440 10/18/18 2358  LIPASE 57* 45   No results for input(s): AMMONIA in the last 168 hours. Coagulation Profile: Recent Labs  Lab 10/19/18 0321  INR 1.2   Cardiac Enzymes: No results for input(s): CKTOTAL, CKMB, CKMBINDEX, TROPONINI in the last 168 hours. BNP (last 3 results) No results for input(s): PROBNP in the last 8760 hours. HbA1C: Recent Labs    10/19/18 0321  HGBA1C 8.4*   CBG: Recent Labs  Lab 10/20/18 1617 10/20/18 1948 10/20/18 2356 10/21/18 0445 10/21/18 0718  GLUCAP 260* 228* 180* 190* 180*   Lipid Profile: No results for input(s): CHOL, HDL, LDLCALC, TRIG, CHOLHDL, LDLDIRECT in the last 72 hours. Thyroid Function Tests: No results for input(s): TSH, T4TOTAL, FREET4, T3FREE, THYROIDAB in the last 72 hours. Anemia Panel: Recent Labs    10/20/18 0528  FERRITIN 1,823*  TIBC 241*  IRON 55   Sepsis Labs: No results for input(s): PROCALCITON, LATICACIDVEN in the last 168 hours.  Recent Results (from the past 240 hour(s))  SARS CORONAVIRUS 2 (TAT 6-24 HRS) Nasopharyngeal Nasopharyngeal Swab     Status: None   Collection Time: 10/18/18  9:22 PM   Specimen: Nasopharyngeal Swab  Result Value Ref Range Status   SARS Coronavirus 2 NEGATIVE NEGATIVE Final    Comment: (NOTE) SARS-CoV-2 target nucleic acids are NOT DETECTED. The SARS-CoV-2 RNA is generally detectable in upper and lower respiratory specimens during the acute phase of infection. Negative results do not preclude SARS-CoV-2 infection, do not rule out co-infections  with other pathogens, and should not be used as the sole basis for treatment or other patient management decisions. Negative results must be combined with clinical observations, patient history, and epidemiological information. The expected result is Negative. Fact Sheet for Patients: SugarRoll.be Fact Sheet for Healthcare Providers: https://www.woods-mathews.com/ This test is not yet approved or cleared by the [place] FDA and  has been authorized for detection and/or diagnosis of SARS-CoV-2 by FDA under an Emergency Use Authorization (EUA). This EUA will remain  in effect (meaning this test can be used) for the duration of the COVID-19 declaration under Section 56 4(b)(1) of the Act, 21 U.S.C. section 360bbb-3(b)(1), unless the authorization is terminated or revoked sooner. Performed at [facility], [place] [address], [place], [place] [address]   Culture, blood (routine x 2)     Status: None (Preliminary result)   Collection Time: 10/18/18 11:59 PM   Specimen: BLOOD RIGHT HAND  Result Value Ref Range Status   Specimen Description BLOOD RIGHT HAND  Final   Special Requests AEROBIC BOTTLE ONLY Blood Culture adequate volume  Final  Culture   Final    NO GROWTH 1 DAY Performed at [facility], [place] [address] [place], [place] [address]    Report Status PENDING  Incomplete  Culture, blood (routine x 2)     Status: None (Preliminary result)   Collection Time: 10/19/18 12:04 AM   Specimen: BLOOD RIGHT FOREARM  Result Value Ref Range Status   Specimen Description BLOOD RIGHT FOREARM  Final   Special Requests   Final    BOTTLES DRAWN AEROBIC AND ANAEROBIC Blood Culture adequate volume   Culture   Final    NO GROWTH 1 DAY Performed at [facility], [place] [address], [place], [place] [address]    Report Status PENDING  Incomplete         Radiology Studies: Nm Hepato Biliary Leak  Result Date: 10/19/2018 CLINICAL  DATA:  Nausea and vomiting following cholecystectomy. Laparoscopic cholecystectomy on 10/04/2018. EXAM: NUCLEAR MEDICINE HEPATOBILIARY IMAGING TECHNIQUE: Sequential images of the abdomen were obtained out to 60 minutes following intravenous administration of radiopharmaceutical. RADIOPHARMACEUTICALS:  5.4 mCi Tc-32m  Choletec IV COMPARISON:  CT 10/18/2018 FINDINGS: Prompt clearance of radiotracer from the blood pool and homogeneous uptake in liver. There is slow transit of radiotracer from the hepatic parenchyma into the bile ducts. The common bile duct is only evident by 85 minutes. By 120 minutes small amount of radioactive bile does entered the small bowel. No evidence of extra hepatic accumulation of radiotracer to suggest bile leak. IMPRESSION: 1. No evidence of bile leak. 2. Poor transient of radiotracer from the hepatic parenchyma suggest cholestasis versus hepatitis. 3. Patent common bile duct. Electronically Signed   By: Suzy Bouchard M.D.   On: 10/19/2018 17:41        Scheduled Meds: . Chlorhexidine Gluconate Cloth  6 each Topical Q0600  . cinacalcet  60 mg Oral Q supper  . darbepoetin (ARANESP) injection - DIALYSIS  25 mcg Intravenous Q Fri-HD  . doxercalciferol  8 mcg Intravenous Q M,W,F-HD  . feeding supplement (PRO-STAT SUGAR FREE 64)  30 mL Oral BID  . insulin aspart  0-9 Units Subcutaneous Q4H  . sodium chloride flush  3 mL Intravenous Once  . sodium chloride flush  3 mL Intravenous Q12H   Continuous Infusions: . sodium chloride    . piperacillin-tazobactam (ZOSYN)  IV 3.375 g (10/21/18 0542)     LOS: 2 days        Aline August, MD [facility] 10/21/2018, 7:48 AM

## 2018-10-21 NOTE — Progress Notes (Signed)
[facility] Progress Note   Subjective:   Patient seen in room. Reports some GI upset with spicy foods, otherwise no abdominal pain, N/V/D. Appetite is ok if he likes the food. Denies SOB, dyspnea, cough, CP, edema. No recent issues with dialysis. LFTs trending down.  Objective Vitals:   10/20/18 1700 10/20/18 1950 10/21/18 0506 10/21/18 0920  BP: (!) 141/72 (!) 154/83 (!) 168/84 (!) 160/81  Pulse: 85 88 84 86  Resp: 18 16 16 18   Temp: 98.6 F (37 C) 98.4 F (36.9 C) 98.7 F (37.1 C) 98.6 F (37 C)  TempSrc: Oral Oral Oral Oral  SpO2: 98% 96% 95% 96%  Weight:   86.8 kg   Height:       Physical Exam General: Well developed, alert male in NAD Heart: RRR no murmurs, rubs or gallops Lungs: CTA bilaterally without wheezing, rhonchi or rales Abdomen: Soft, non-tender, non-distended. +BS Extremities: No peripheral edema Dialysis Access:  LUE AVF + bruit  Additional Objective Labs: Basic Metabolic Panel: Recent Labs  Lab 10/19/18 0321 10/20/18 0528 10/21/18 0617  NA 137 133* 135  K 3.6 3.7 3.6  CL 94* 93* 93*  CO2 32 27 26  GLUCOSE 192* 164* 195*  BUN 28* 20 36*  CREATININE 4.87* 5.24* 8.47*  CALCIUM 8.9 9.0 8.8*   Liver Function Tests: Recent Labs  Lab 10/19/18 0321 10/20/18 0528 10/21/18 0617  AST 499* 251* 141*  ALT 592* 495* 334*  ALKPHOS 1,214* 1,214* 1,138*  BILITOT 4.0* 3.6* 2.2*  PROT 6.0* 6.3* 6.5  ALBUMIN 2.6* 2.5* 2.5*   Recent Labs  Lab 10/18/18 1440 10/18/18 2358  LIPASE 57* 45   CBC: Recent Labs  Lab 10/18/18 1440 10/19/18 0321 10/20/18 0528 10/21/18 0617  WBC 8.8 4.5 5.5 4.7  NEUTROABS  --   --   --  3.4  HGB 12.5* 9.7* 10.5* 10.7*  HCT 36.5* 29.0* 31.1* 31.2*  MCV 87.3 90.1 88.4 87.6  PLT 204 200 222 234   Blood Culture    Component Value Date/Time   SDES BLOOD RIGHT FOREARM 10/19/2018 0004   SPECREQUEST  10/19/2018 0004    BOTTLES DRAWN AEROBIC AND ANAEROBIC Blood Culture adequate volume   CULT  10/19/2018  0004    NO GROWTH 2 DAYS Performed at [facility], [place] [address], [place], [place] [address]    REPTSTATUS PENDING 10/19/2018 0004    Cardiac Enzymes: No results for input(s): CKTOTAL, CKMB, CKMBINDEX, TROPONINI in the last 168 hours. CBG: Recent Labs  Lab 10/20/18 1948 10/20/18 2356 10/21/18 0445 10/21/18 0718 10/21/18 1118  GLUCAP 228* 180* 190* 180* 192*   Iron Studies:  Recent Labs    10/20/18 0528  IRON 55  TIBC 241*  FERRITIN 1,823*   @lablastinr3 @ Studies/Results: Nm Hepato Biliary Leak  Result Date: 10/19/2018 CLINICAL DATA:  Nausea and vomiting following cholecystectomy. Laparoscopic cholecystectomy on 10/04/2018. EXAM: NUCLEAR MEDICINE HEPATOBILIARY IMAGING TECHNIQUE: Sequential images of the abdomen were obtained out to 60 minutes following intravenous administration of radiopharmaceutical. RADIOPHARMACEUTICALS:  5.4 mCi Tc-36m  Choletec IV COMPARISON:  CT 10/18/2018 FINDINGS: Prompt clearance of radiotracer from the blood pool and homogeneous uptake in liver. There is slow transit of radiotracer from the hepatic parenchyma into the bile ducts. The common bile duct is only evident by 85 minutes. By 120 minutes small amount of radioactive bile does entered the small bowel. No evidence of extra hepatic accumulation of radiotracer to suggest bile leak. IMPRESSION: 1. No evidence of bile leak.  2. Poor transient of radiotracer from the hepatic parenchyma suggest cholestasis versus hepatitis. 3. Patent common bile duct. Electronically Signed   By: Suzy Bouchard M.D.   On: 10/19/2018 17:41   Medications: . sodium chloride     . Chlorhexidine Gluconate Cloth  6 each Topical Q0600  . cinacalcet  60 mg Oral Q supper  . darbepoetin (ARANESP) injection - DIALYSIS  25 mcg Intravenous Q Fri-HD  . doxercalciferol  8 mcg Intravenous Q M,W,F-HD  . feeding supplement (PRO-STAT SUGAR FREE 64)  30 mL Oral BID  . insulin aspart  0-9 Units Subcutaneous Q4H  . sodium chloride  flush  3 mL Intravenous Once  . sodium chloride flush  3 mL Intravenous Q12H    [facility], 15g needles, Dialyzer 180NRe, BFR 400, DFR 800, T 4hr 60min, EDW 90.5kg Heparin 2600 unit bolus Hectorol 10 mcg IV q HD Sensipar 60 mg daily Renvela 800mg  3 tabs PO TID with meals  Assessment/Plan: 1. N/V with elevated LFTs: S/p lap cholecystectomy on 10/04/2018. CT showed fluid collection along inferior aspect of liver on CT. On zosyn. HIDA scan negative for bile leak, LFTs are trending down today. Denies any further fevers, nausea or vomiting. Management per surgery/GI. 2. ESRD:MWF at [facility]. K+ 3.6. Plan for next HD on Monday, 9/28.  3. Hypertension/volume:No evidence of volume overload on exam. Below EDW but has been losing weight lately due to GI issues. Will need lower EDW at discharge. 4. Anemia:Not on ESA as outpatient. Started on aranesp q Friday. Hgb now 10.7.  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 5. Metabolic bone disease:Resume renvela. Corrected calcium improved 10.2 > 10.0. Will continue hectorol at reduced dose of 8 mcg, continue sensipar. Follow calcium. 6. Nutrition:Albumin 2.5. Will start pro-stat supplement.  7. T2DM:A1c above goal at 8.4. Continue insulin management per primary team.     Anice Paganini, PA-C 10/21/2018, 12:53 PM  [facility] Pager: [phone]

## 2018-10-21 NOTE — Progress Notes (Addendum)
[facility] Progress Note  Chief Complaint: Elevated LFTs  History:  Edgar Brewer is feeling better today, and really has no abdominal pain.  He is a limited to story and as before.  He is eating well without nausea or vomiting.  He has had no fevers.  ROS: Cardiovascular: Denies chest pain Respiratory: Denies dyspnea   Objective:   Current Facility-Administered Medications:  .  0.9 %  sodium chloride infusion, 250 mL, Intravenous, PRN, Opyd, Ilene Qua, MD .  Chlorhexidine Gluconate Cloth 2 % PADS 6 each, 6 each, Topical, Q0600, Janalee Dane, PA-C, 6 each at 10/19/18 0857 .  cinacalcet (SENSIPAR) tablet 60 mg, 60 mg, Oral, Q supper, Collins, Hervey Ard, PA-C, 60 mg at 10/21/18 1722 .  Darbepoetin Alfa (ARANESP) injection 25 mcg, 25 mcg, Intravenous, Q Fri-HD, Collins, Samantha G, PA-C, 25 mcg at 10/19/18 1334 .  doxercalciferol (HECTOROL) injection 8 mcg, 8 mcg, Intravenous, Q M,W,F-HD, Collins, Samantha G, PA-C, 8 mcg at 10/19/18 1334 .  feeding supplement (PRO-STAT SUGAR FREE 64) liquid 30 mL, 30 mL, Oral, BID, Collins, Samantha G, PA-C, 30 mL at 10/21/18 0837 .  fentaNYL (SUBLIMAZE) injection 25-50 mcg, 25-50 mcg, Intravenous, Q2H PRN, Alekh, Kshitiz, MD .  insulin aspart (novoLOG) injection 0-9 Units, 0-9 Units, Subcutaneous, Q4H, Opyd, Ilene Qua, MD, 2 Units at 10/21/18 1721 .  labetalol (NORMODYNE) injection 10 mg, 10 mg, Intravenous, Q2H PRN, Opyd, Timothy S, MD .  ondansetron (ZOFRAN) tablet 4 mg, 4 mg, Oral, Q6H PRN **OR** ondansetron (ZOFRAN) injection 4 mg, 4 mg, Intravenous, Q6H PRN, Opyd, Timothy S, MD .  sevelamer carbonate (RENVELA) tablet 1,600 mg, 1,600 mg, Oral, TID WC, Collins, Samantha G, PA-C, 1,600 mg at 10/21/18 1722 .  sodium chloride flush (NS) 0.9 % injection 3 mL, 3 mL, Intravenous, Once, Opyd, Timothy S, MD .  sodium chloride flush (NS) 0.9 % injection 3 mL, 3 mL, Intravenous, Q12H, Opyd, Ilene Qua, MD, 3 mL at 10/19/18 2157 .  sodium chloride flush (NS) 0.9  % injection 3 mL, 3 mL, Intravenous, PRN, Opyd, Timothy S, MD .  traMADol (ULTRAM) tablet 50 mg, 50 mg, Oral, Q12H PRN, Starla Link, Kshitiz, MD, 50 mg at 10/21/18 1146  . sodium chloride       Vital signs in last 24 hrs: Vitals:   10/21/18 0920 10/21/18 1636  BP: (!) 160/81 (!) 169/77  Pulse: 86 80  Resp: 18 18  Temp: 98.6 F (37 C) 98.8 F (37.1 C)  SpO2: 96% 95%    Intake/Output Summary (Last 24 hours) at 10/21/2018 1738 Last data filed at 10/21/2018 1300 Gross per 24 hour  Intake 770 ml  Output 0 ml  Net 770 ml     Physical Exam Pleasant, conversational, restricted affect as before.  However he is happily watching football today.  HEENT: sclera anicteric, oral mucosa without lesions  Neck: supple, no thyromegaly, JVD or lymphadenopathy  Cardiac: RRR without murmurs, S1S2 heard, no peripheral edema  Pulm: clear to auscultation bilaterally, normal RR and effort noted  Abdomen: soft, no tenderness, with active bowel sounds. No guarding or palpable hepatosplenomegaly  Skin; warm and dry, mild jaundice Left upper extremity AV fistula Recent Labs:  CBC Latest Ref Rng & Units 10/21/2018 10/20/2018 10/19/2018  WBC 4.0 - 10.5 K/uL 4.7 5.5 4.5  Hemoglobin 13.0 - 17.0 g/dL 10.7(L) 10.5(L) 9.7(L)  Hematocrit 39.0 - 52.0 % 31.2(L) 31.1(L) 29.0(L)  Platelets 150 - 400 K/uL 234 222 200    Recent Labs  Lab 10/19/18  0321  INR 1.2   CMP Latest Ref Rng & Units 10/21/2018 10/20/2018 10/19/2018  Glucose 70 - 99 mg/dL 195(H) 164(H) 192(H)  BUN 6 - 20 mg/dL 36(H) 20 28(H)  Creatinine 0.61 - 1.24 mg/dL 8.47(H) 5.24(H) 4.87(H)  Sodium 135 - 145 mmol/L 135 133(L) 137  Potassium 3.5 - 5.1 mmol/L 3.6 3.7 3.6  Chloride 98 - 111 mmol/L 93(L) 93(L) 94(L)  CO2 22 - 32 mmol/L 26 27 32  Calcium 8.9 - 10.3 mg/dL 8.8(L) 9.0 8.9  Total Protein 6.5 - 8.1 g/dL 6.5 6.3(L) 6.0(L)  Total Bilirubin 0.3 - 1.2 mg/dL 2.2(H) 3.6(H) 4.0(H)  Alkaline Phos 38 - 126 U/L 1,138(H) 1,214(H) 1,214(H)  AST 15 - 41  U/L 141(H) 251(H) 499(H)  ALT 0 - 44 U/L 334(H) 495(H) 592(H)     @ASSESSMENTPLANBEGIN @ Assessment: Elevated LFTs.  This still appears to be some intrinsic liver disease that we have not yet discovered.  Autoimmune serologies are pending, but we do not need results before discharging him home if he is otherwise ready to do so.  Because the seem to start with acute upper digestive symptoms a few weeks back and fevers with this whole scenario, it was felt to be acute or chronic cholecystitis.  There were no new medicines that would seem to cause drug-induced liver injury. I wonder if he could have had EBV or, less likely, CMV.  Therefore, I order those serologies for tomorrow.   Plan: If he is feeling this well tomorrow, and his LFTs are at least stable or hopefully continue to decrease, then he can be most likely discharged home for outpatient follow-up with with his primary GI physician, Dr. Denzil Magnuson.  Our consult team will see him tomorrow and help arrange that follow-up.  The primary medical team message me earlier today, and I agreed he could stop antibiotics.  Total time 25 minutes   Nelida Meuse III Office: [phone]

## 2018-10-21 NOTE — Plan of Care (Signed)
  Problem: Education: Goal: Knowledge of General Education information will improve Description Including pain rating scale, medication(s)/side effects and non-pharmacologic comfort measures Outcome: Progressing   Problem: Health Behavior/Discharge Planning: Goal: Ability to manage health-related needs will improve Outcome: Progressing   

## 2018-10-22 ENCOUNTER — Other Ambulatory Visit: Payer: Self-pay | Admitting: Nurse Practitioner

## 2018-10-22 DIAGNOSIS — R945 Abnormal results of liver function studies: Secondary | ICD-10-CM

## 2018-10-22 DIAGNOSIS — R7989 Other specified abnormal findings of blood chemistry: Secondary | ICD-10-CM

## 2018-10-22 LAB — COMPREHENSIVE METABOLIC PANEL
ALT: 276 U/L — ABNORMAL HIGH (ref 0–44)
AST: 122 U/L — ABNORMAL HIGH (ref 15–41)
Albumin: 2.4 g/dL — ABNORMAL LOW (ref 3.5–5.0)
Alkaline Phosphatase: 1098 U/L — ABNORMAL HIGH (ref 38–126)
Anion gap: 17 — ABNORMAL HIGH (ref 5–15)
BUN: 47 mg/dL — ABNORMAL HIGH (ref 6–20)
CO2: 24 mmol/L (ref 22–32)
Calcium: 8.5 mg/dL — ABNORMAL LOW (ref 8.9–10.3)
Chloride: 93 mmol/L — ABNORMAL LOW (ref 98–111)
Creatinine, Ser: 10.92 mg/dL — ABNORMAL HIGH (ref 0.61–1.24)
GFR calc Af Amer: 5 mL/min — ABNORMAL LOW (ref >60)
GFR calc non Af Amer: 5 mL/min — ABNORMAL LOW (ref >60)
Glucose, Bld: 233 mg/dL — ABNORMAL HIGH (ref 70–99)
Potassium: 3.9 mmol/L (ref 3.5–5.1)
Sodium: 134 mmol/L — ABNORMAL LOW (ref 135–145)
Total Bilirubin: 1.8 mg/dL — ABNORMAL HIGH (ref 0.3–1.2)
Total Protein: 6.1 g/dL — ABNORMAL LOW (ref 6.5–8.1)

## 2018-10-22 LAB — GLUCOSE, CAPILLARY
Glucose-Capillary: 197 mg/dL — ABNORMAL HIGH (ref 70–99)
Glucose-Capillary: 213 mg/dL — ABNORMAL HIGH (ref 70–99)
Glucose-Capillary: 214 mg/dL — ABNORMAL HIGH (ref 70–99)

## 2018-10-22 LAB — CBC WITH DIFFERENTIAL/PLATELET
Abs Immature Granulocytes: 0.1 10*3/uL — ABNORMAL HIGH (ref 0.00–0.07)
Basophils Absolute: 0.1 10*3/uL (ref 0.0–0.1)
Basophils Relative: 1 %
Eosinophils Absolute: 0.2 10*3/uL (ref 0.0–0.5)
Eosinophils Relative: 3 %
HCT: 31 % — ABNORMAL LOW (ref 39.0–52.0)
Hemoglobin: 10.5 g/dL — ABNORMAL LOW (ref 13.0–17.0)
Immature Granulocytes: 2 %
Lymphocytes Relative: 15 %
Lymphs Abs: 0.8 10*3/uL (ref 0.7–4.0)
MCH: 30.1 pg (ref 26.0–34.0)
MCHC: 33.9 g/dL (ref 30.0–36.0)
MCV: 88.8 fL (ref 80.0–100.0)
Monocytes Absolute: 0.4 10*3/uL (ref 0.1–1.0)
Monocytes Relative: 7 %
Neutro Abs: 3.7 10*3/uL (ref 1.7–7.7)
Neutrophils Relative %: 72 %
Platelets: 243 10*3/uL (ref 150–400)
RBC: 3.49 MIL/uL — ABNORMAL LOW (ref 4.22–5.81)
RDW: 14 % (ref 11.5–15.5)
WBC: 5.2 10*3/uL (ref 4.0–10.5)
nRBC: 0 % (ref 0.0–0.2)

## 2018-10-22 LAB — PROTIME-INR
INR: 1 (ref 0.8–1.2)
Prothrombin Time: 12.8 seconds (ref 11.4–15.2)

## 2018-10-22 LAB — HEPATITIS PANEL, ACUTE
HCV Ab: <0.1 s/co ratio (ref 0.0–0.9)
Hep A IgM: NEGATIVE
Hep B C IgM: NEGATIVE
Hepatitis B Surface Ag: NEGATIVE

## 2018-10-22 LAB — MITOCHONDRIAL ANTIBODIES: Mitochondrial M2 Ab, IgG: <20 Units (ref 0.0–20.0)

## 2018-10-22 LAB — ANTI-SMOOTH MUSCLE ANTIBODY, IGG: F-Actin IgG: 5 Units (ref 0–19)

## 2018-10-22 LAB — HEPATITIS B CORE ANTIBODY, TOTAL: Hep B Core Total Ab: NEGATIVE

## 2018-10-22 MED ORDER — ONDANSETRON HCL 4 MG PO TABS: 4.0000 mg | ORAL_TABLET | Freq: Four times a day (QID) | ORAL | 0 refills | Status: DC | PRN

## 2018-10-22 MED ORDER — TRAMADOL HCL 50 MG PO TABS: 50.0000 mg | ORAL_TABLET | Freq: Two times a day (BID) | ORAL | 0 refills | Status: DC | PRN

## 2018-10-22 MED ORDER — SEVELAMER CARBONATE 800 MG PO TABS: 1600.0000 mg | ORAL_TABLET | Freq: Three times a day (TID) | ORAL | Status: AC

## 2018-10-22 NOTE — Discharge Instructions (Signed)
Please go to [facility] office at [place], [place] [address] on 10/25/18 between the hours of 7:30 am and 5:00 pm to have labs drawn. Our lab is located in the basement of the building.

## 2018-10-22 NOTE — Progress Notes (Signed)
[facility] Progress Note     Subjective: CC-  No complaints. Tolerating diet. Denies abdominal pain, n/v this AM. Ready to go home.  Objective: Vital signs in last 24 hours: Temp:  [97.9 F (36.6 C)-98.8 F (37.1 C)] 98.1 F (36.7 C) (09/28 0517) Pulse Rate:  [77-86] 79 (09/28 0517) Resp:  [16-18] 18 (09/28 0517) BP: (160-169)/(76-87) 168/87 (09/28 0517) SpO2:  [95 %-96 %] 96 % (09/28 0517) Last BM Date: 10/21/18  Intake/Output from previous day: 09/27 0701 - 09/28 0700 In: 720 [P.O.:720] Out: 0  Intake/Output this shift: No intake/output data recorded.  PE: Gen:  Alert, NAD, pleasant HEENT: EOM's intact, pupils equal and round Pulm:  Rate and effort normal Abd: Soft, protuberant, ND, NT, +BS, lap incisions cdi without erythema or drainage Skin: warm and dry  Lab Results:  Recent Labs    10/21/18 0617 10/22/18 0426  WBC 4.7 5.2  HGB 10.7* 10.5*  HCT 31.2* 31.0*  PLT 234 243   BMET Recent Labs    10/21/18 0617 10/22/18 0426  NA 135 134*  K 3.6 3.9  CL 93* 93*  CO2 26 24  GLUCOSE 195* 233*  BUN 36* 47*  CREATININE 8.47* 10.92*  CALCIUM 8.8* 8.5*   PT/INR Recent Labs    10/22/18 0426  LABPROT 12.8  INR 1.0   CMP     Component Value Date/Time   NA 134 (L) 10/22/2018 0426   NA 140 03/04/2016 1317   K 3.9 10/22/2018 0426   CL 93 (L) 10/22/2018 0426   CO2 24 10/22/2018 0426   GLUCOSE 233 (H) 10/22/2018 0426   BUN 47 (H) 10/22/2018 0426   BUN 47 (H) 03/04/2016 1317   CREATININE 10.92 (H) 10/22/2018 0426   CALCIUM 8.5 (L) 10/22/2018 0426   PROT 6.1 (L) 10/22/2018 0426   PROT 5.2 (L) 02/29/2016 1439   ALBUMIN 2.4 (L) 10/22/2018 0426   ALBUMIN 3.1 (L) 02/29/2016 1439   AST 122 (H) 10/22/2018 0426   ALT 276 (H) 10/22/2018 0426   ALKPHOS 1,098 (H) 10/22/2018 0426   BILITOT 1.8 (H) 10/22/2018 0426   BILITOT 0.2 02/29/2016 1439   GFRNONAA 5 (L) 10/22/2018 0426   GFRAA 5 (L) 10/22/2018 0426   Lipase     Component Value  Date/Time   LIPASE 45 10/18/2018 2358       Studies/Results: No results found.  Anti-infectives: Anti-infectives (From admission, onward)   Start     Dose/Rate Route Frequency Ordered Stop   10/19/18 0600  piperacillin-tazobactam (ZOSYN) IVPB 3.375 g  Status:  Discontinued     3.375 g 12.5 mL/hr over 240 Minutes Intravenous Every 12 hours 10/18/18 2208 10/21/18 1130   10/18/18 1945  piperacillin-tazobactam (ZOSYN) IVPB 3.375 g     3.375 g 100 mL/hr over 30 Minutes Intravenous  Once 10/18/18 1942 10/18/18 2219       Assessment/Plan S/p lap chole 10/04/18 Dr. Kieth Brightly Post op 3 x 2 cm collection in gallbladder bed Transaminitis and elevated bilirubin - HIDA negative for bile leak or obstruction - may indicate intrinsic liver disease - slight improvement in LFTs this AM; several labs per GI still pending - appreciate GI assistance and workup; Agree that laboratory abnormalities may be medical in origin and not related to recent surgery - Continue management per primary and GI. General surgery will sign off, please call with concerns. He does not have to follow up in our clinic again. No restrictions from gallbladder surgery. Resume activity and diet as  tolerated.   LOS: 3 days    Wellington Hampshire , [facility] 10/22/2018, 8:41 AM Pager: [phone] Mon-Thurs 7:00 am-4:30 pm Fri 7:00 am -11:30 AM Sat-Sun 7:00 am-11:30 am

## 2018-10-22 NOTE — Evaluation (Signed)
Physical Therapy Evaluation and Discharge Patient Details Name: Edgar Brewer MRN: [id number] DOB: 06/27/1960 Today's Date: 10/22/2018   History of Present Illness  58-year-old male with history of end-stage renal disease on hemodialysis, diabetes mellitus type 2, chronic combined systolic and diastolic heart failure, hypertension and recent admission for laparoscopic cholecystectomy on 10/04/2018, discharged on 10/06/2018 presented with nausea and vomiting.  He was found to have elevated LFTs.  CT abdomen and pelvis on presentation showed 3 x 2 cm fluid pocket along inferior surface of liver, may represent seroma.  Biloma or abscess not excluded.  Minor stranding at hepatic flexure, likely secondary to cholecystectomy and less likely acute diverticulitis.  GI and general surgery were consulted.  Nephrology was also consulted.  Patient was started on empiric antibiotics.  Clinical Impression   Patient evaluated by Physical Therapy with no further acute PT needs identified, as he is discharging today. All education has been completed and the patient has no further questions. At this point, he is hesitant to start an Outpt PT program for gait and balance; I advised him that when he would like to go to PT fo rgait and balance, he can get a referral from his PCP as well;  See below for any follow-up Physical Therapy or equipment needs. PT is signing off. Thank you for this referral.     Follow Up Recommendations Outpatient PT(for gait and balance dysfunction)    Equipment Recommendations  None recommended by PT    Recommendations for Other Services       Precautions / Restrictions Precautions Precautions: Fall Precaution Comments: Fall risk present, but relatively low Restrictions Weight Bearing Restrictions: No      Mobility  Bed Mobility Overal bed mobility: Independent                Transfers Overall transfer level: Independent                   Ambulation/Gait Ambulation/Gait assistance: Supervision;Modified independent (Device/Increase time) Gait Distance (Feet): 250 Feet Assistive device: None Gait Pattern/deviations: Wide base of support;Decreased step length - right;Decreased step length - left Gait velocity: very slow   General Gait Details: Very slow pace, with wide base of support; inefficient gait pattern; this is indicative of incr fall risk in teh community  Stairs            Wheelchair Mobility    Modified Rankin (Stroke Patients Only)       Balance                                             Pertinent Vitals/Pain Pain Assessment: No/denies pain    Home Living Family/patient expects to be discharged to:: Private residence Living Arrangements: Parent Available Help at Discharge: Family;Available PRN/intermittently Type of Home: House Home Access: Stairs to enter Entrance Stairs-Rails: Right;Left Entrance Stairs-Number of Steps: (a few) Home Layout: One level Home Equipment: Cane - single point      Prior Function Level of Independence: Independent with assistive device(s)         Comments: Uses a single point cane when walking outside the home; reports he feels "dumpy" after HD, but he deals with it     Hand Dominance        Extremity/Trunk Assessment   Upper Extremity Assessment Upper Extremity Assessment: Overall WFL for tasks assessed    Lower Extremity   Assessment Lower Extremity Assessment: Generalized weakness(decr coordination)       Communication   Communication: No difficulties  Cognition Arousal/Alertness: Awake/alert Behavior During Therapy: WFL for tasks assessed/performed Overall Cognitive Status: Within Functional Limits for tasks assessed                                        General Comments      Exercises     Assessment/Plan    PT Assessment All further PT needs can be met in the next venue of care  PT Problem  List Decreased balance;Decreased mobility;Decreased coordination       PT Treatment Interventions      PT Goals (Current goals can be found in the Care Plan section)  Acute Rehab PT Goals Patient Stated Goal: Tells me he wants to walk faster and more efficiently PT Goal Formulation: All assessment and education complete, DC therapy    Frequency     Barriers to discharge        Co-evaluation               AM-PAC PT "6 Clicks" Mobility  Outcome Measure Help needed turning from your back to your side while in a flat bed without using bedrails?: None Help needed moving from lying on your back to sitting on the side of a flat bed without using bedrails?: None Help needed moving to and from a bed to a chair (including a wheelchair)?: None Help needed standing up from a chair using your arms (e.g., wheelchair or bedside chair)?: None Help needed to walk in hospital room?: None Help needed climbing 3-5 steps with a railing? : A Little 6 Click Score: 23    End of Session   Activity Tolerance: Patient tolerated treatment well Patient left: in bed;with call bell/phone within reach Nurse Communication: Mobility status PT Visit Diagnosis: Unsteadiness on feet (R26.81);Other abnormalities of gait and mobility (R26.89)    Time: 0930-0950 PT Time Calculation (min) (ACUTE ONLY): 20 min   Charges:   PT Evaluation $PT Eval Low Complexity: 1 Low           , PT  Acute Rehabilitation Services Pager [phone] Office [phone]    H  10/22/2018, 10:54 AM   

## 2018-10-22 NOTE — Discharge Summary (Signed)
Physician Discharge Summary  Atreus Hasz STM:[id number] DOB: 1960-07-06 DOA: 10/18/2018  PCP: System, Pcp Not In  Admit date: 10/18/2018 Discharge date: 10/22/2018  Admitted From: Home Disposition: Home  Recommendations for Outpatient Follow-up:  1. Follow up with PCP in 1 week with repeat CBC/CMP 2. Outpatient follow-up with gastroenterology 3. Follow-up with outpatient dialysis unit as scheduled 4. Follow up in ED if symptoms worsen or new appear   Home Health: No Equipment/Devices: None  Discharge Condition: Stable CODE STATUS: Full Diet recommendation: Heart healthy/carb modified/renal hemodialysis diet  Brief/Interim Summary: 58 year old male with history of end-stage renal disease on hemodialysis, diabetes mellitus type 2, chronic combined systolic and diastolic heart failure, hypertension and recent admission for laparoscopic cholecystectomy on 10/04/2018, discharged on 10/06/2018 presented with nausea and vomiting.  He was found to have elevated LFTs.  CT abdomen and pelvis on presentation showed 3 x 2 cm fluid pocket along inferior surface of liver, may represent seroma.  Biloma or abscess not excluded.  Minor stranding at hepatic flexure, likely secondary to cholecystectomy and less likely acute diverticulitis.  GI and general surgery were consulted.  Nephrology was also consulted.  Patient was started on empiric antibiotics.  During the hospitalization, his condition is improved.  Empiric antibiotics has been discontinued.  His LFTs are improving.  He is tolerating diet.  GI and general surgery have cleared the patient for discharge.  HIDA scan was negative for bile leak.  Outpatient follow-up with GI.  He will be discharged today.  Discharge Diagnoses:   Nausea/vomiting/elevated LFTs Intra-abdominal fluid collection, seroma versus biloma or abscess in a patient with recent laparoscopic cholecystectomy with initial concern for bile leak -CT abdomen and pelvis on presentation  showed 3 x 2 cm fluid pocket along inferior surface of liver, may represent seroma.  Biloma or abscess not excluded.  Minor stranding at hepatic flexure, likely secondary to cholecystectomy and less likely acute diverticulitis.  -GI and general surgery were consulted. -Patient was started on empiric antibiotics. -HIDA scan was negative for bile leak.  LFTs are still elevated but improving. -Zosyn was subsequently discontinued as there was no evidence of any infection. -Nausea and vomiting are much improving.  He is tolerating diet.  GI and general surgery have cleared the patient for discharge.  Outpatient follow-up with GI with repeat LFTs.  Will hold statin on discharge.  End-stage renal disease on hemodialysis  -Nephrology following.  Dialysis as per nephrology schedule.  Outpatient follow-up with dialysis unit  Diabetes mellitus type II -Carb modified diet.  Outpatient follow-up   Discharge Instructions  Discharge Instructions    Diet - low sodium heart healthy   Complete by: As directed    Diet Carb Modified   Complete by: As directed    Increase activity slowly   Complete by: As directed      Allergies as of 10/22/2018      Reactions   Codeine Hives, Swelling, Other (See Comments)   Swelling all over body       Medication List    STOP taking these medications   atorvastatin 20 MG tablet Commonly known as: LIPITOR   oxyCODONE 5 MG immediate release tablet Commonly known as: Oxy IR/ROXICODONE     TAKE these medications   b complex-vitamin c-folic acid 0.8 MG Tabs tablet Take 1 tablet by mouth daily.   cinacalcet 30 MG tablet Commonly known as: SENSIPAR Take 2 tablets (60 mg total) by mouth daily with supper.   doxercalciferol 4 MCG/2ML injection Commonly known  as: HECTOROL Inject 5 mLs (10 mcg total) into the vein every Monday, Wednesday, and Friday with hemodialysis.   gabapentin 300 MG capsule Commonly known as: NEURONTIN Take 300 mg by mouth 2 (two) times  daily. Morning and Bedtime   ibuprofen 200 MG tablet Commonly known as: ADVIL Take 200-800 mg by mouth every 8 (eight) hours as needed for headache or mild pain.   omeprazole 20 MG capsule Commonly known as: PRILOSEC Take 20 mg by mouth daily before breakfast.   ondansetron 4 MG tablet Commonly known as: ZOFRAN Take 1 tablet (4 mg total) by mouth every 6 (six) hours as needed for nausea.   sevelamer carbonate 800 MG tablet Commonly known as: RENVELA Take 2 tablets (1,600 mg total) by mouth 3 (three) times daily with meals. What changed: how much to take   STOOL SOFTENER LAXATIVE PO Take 1-3 capsules by mouth at bedtime.   Tradjenta 5 MG Tabs tablet Generic drug: linagliptin Take 5 mg by mouth daily.   traMADol 50 MG tablet Commonly known as: ULTRAM Take 1 tablet (50 mg total) by mouth every 12 (twelve) hours as needed for moderate pain.      Follow-up [facility], [place]. Call.   Specialty: General Surgery Why: Call as needed regarding your gallbladder surgery, you do not have to follow up in our office again. Call with conerns. Contact information: [address] [place] [phone]       Mauri Pole, MD Follow up on 11/01/2018.   Specialty: Gastroenterology Why: at 8:30 am. Please arrive 15 minutes early Contact information: [place] [address] [phone]        Josue Hector, MD. Schedule an appointment as soon as possible for a visit in 1 week(s).   Specialty: Cardiology Contact information: [address] [place] [address] [phone]          Allergies  Allergen Reactions  . Codeine Hives, Swelling and Other (See Comments)    Swelling all over body     Consultations:  GI/general surgery/nephrology   Procedures/Studies: Ct Head Wo Contrast  Result Date: 10/02/2018 CLINICAL DATA:  Altered level of consciousness  EXAM: CT HEAD WITHOUT CONTRAST TECHNIQUE: Contiguous axial images were obtained from the base of the skull through the vertex without intravenous contrast. COMPARISON:  None. FINDINGS: Brain: No evidence of acute territorial infarction, hemorrhage, hydrocephalus,extra-axial collection or mass lesion/mass effect. Normal gray-white differentiation. Ventricles are normal in size and contour. Vascular: No hyperdense vessel or unexpected calcification. Skull: The skull is intact. No fracture or focal lesion identified. Sinuses/Orbits: The visualized paranasal sinuses and mastoid air cells are clear. The orbits and globes intact. Other: None IMPRESSION: No acute intracranial pathology. Electronically Signed   By: Prudencio Pair M.D.   On: 10/02/2018 19:55   Mr Brain Wo Contrast  Result Date: 09/27/2018  [facility] [address] [place], [place] [address] [phone] NEUROIMAGING REPORT STUDY DATE: 09/27/2018 PATIENT NAME: Calvert Charland DOB: 1960/06/17 MRN: [id number] EXAM: MRI Brain without contrast ORDERING CLINICIAN: Kathrynn Ducking, MD CLINICAL HISTORY: 58 year old man with memory loss COMPARISON FILMS: None TECHNIQUE: MRI of the brain without contrast was obtained utilizing 5 mm axial slices with T1, T2, T2 flair, SWI and diffusion weighted views.  T1 sagittal and T2 coronal views were obtained. CONTRAST: none IMAGING SITE: [facility], [place], [place] FINDINGS: On sagittal images, the spinal cord  is imaged caudally to C3 and is normal in caliber.   The contents of the posterior fossa are of normal size and position.   The pituitary gland and optic chiasm appear normal.    Brain volume appears normal for age.   The ventricles are normal in size for age and without distortion.  There are no abnormal extra-axial collections of fluid.  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  Elsewhere in the hemispheres, there are a few scattered T2/flair  hyperintense foci consistent with very minimal chronic microvascular ischemic change.  The cerebellum and brainstem appears normal.   The deep gray matter appears normal.  Diffusion weighted images are normal.  Susceptibility weighted images are normal.  The VIIth/VIIIth nerve complex appears normal.  The mastoid air cells appear normal.  There have been bilateral lens replacements.  The orbits are otherwise normal.  Very minimal mucoperiosteal thickening inferiorly in the maxillary sinuses.  The other paranasal sinuses appear normal.  Flow voids are identified within the major intracerebral arteries.     This MRI of the brain without contrast shows the following: 1.   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 2.    Minimal chronic microvascular ischemic changes in the hemispheres. 3.    Brain volume is normal for age. 4.    No acute findings. INTERPRETING PHYSICIAN: Richard A. Felecia Shelling, MD, PhD, FAAN Certified in  Neuroimaging by [employer]   Ct Abdomen Pelvis W Contrast  Result Date: 10/18/2018 CLINICAL DATA:  58 year old male with nausea and vomiting. Concern for retained stone. EXAM: CT ABDOMEN AND PELVIS WITH CONTRAST TECHNIQUE: Multidetector CT imaging of the abdomen and pelvis was performed using the standard protocol following bolus administration of intravenous contrast. CONTRAST:  184mL OMNIPAQUE IOHEXOL 300 MG/ML  SOLN COMPARISON:  CT of the abdomen pelvis dated 10/02/2018 FINDINGS: Lower chest: Bibasilar linear atelectasis/scarring. No intra-abdominal free air or free fluid. Hepatobiliary: Probable mild fatty infiltration of the liver. No intrahepatic biliary ductal dilatation. Cholecystectomy. There is a 3 x 2 cm fluid pocket along the inferior surface of the liver in the cholecystectomy. This may represent a seroma. A biloma or an abscess are not excluded. Clinical correlation is recommended. No retained calcified stone noted the central CBD. Pancreas:  Unremarkable. No pancreatic ductal dilatation or surrounding inflammatory changes. Spleen: Mild splenomegaly measuring up to 15 cm length. Adrenals/Urinary Tract: The adrenal glands are unremarkable. There is no hydronephrosis on either side. There is symmetric enhancement and excretion of contrast by both kidneys. The visualized ureters and urinary bladder appear unremarkable. Stomach/Bowel: There is sigmoid diverticulosis and scattered colonic diverticula. Mild haziness adjacent to the hepatic flexure diverticula likely related to stranding from cholecystectomy and less likely related to diverticulitis. There is no bowel obstruction. The appendix is normal. Vascular/Lymphatic: Moderate aortoiliac atherosclerotic disease. IVC is unremarkable. No portal venous gas. There is no adenopathy. Reproductive: The prostate and seminal vesicles are grossly unremarkable. No pelvic mass. Other: None Musculoskeletal: Degenerative changes of the spine. L5-S1 disc desiccation and vacuum phenomena. No acute osseous pathology. IMPRESSION: 1. A 3 x 2 cm fluid pocket along the inferior surface of the liver in the cholecystectomy. This may represent a seroma. A biloma or an abscess are not excluded. Clinical correlation is recommended. No retained calcified stone noted in the central CBD. 2. Colonic diverticulosis. Minimal stranding adjacent to the hepatic flexure, likely secondary to cholecystectomy and less likely acute cholecystitis. No bowel  obstruction. Normal appendix. Aortic Atherosclerosis (ICD10-I70.0). Electronically Signed   By: Anner Crete M.D.   On: 10/18/2018 20:40   Ct Abdomen Pelvis W Contrast  Result Date: 10/02/2018 CLINICAL DATA:  Abdominal distention, nausea, vomiting. Question gallbladder disease. EXAM: CT ABDOMEN AND PELVIS WITH CONTRAST TECHNIQUE: Multidetector CT imaging of the abdomen and pelvis was performed using the standard protocol following bolus administration of intravenous contrast. CONTRAST:   155mL OMNIPAQUE IOHEXOL 300 MG/ML  SOLN COMPARISON:  None. FINDINGS: Lower chest: Cardiomegaly.  No confluent opacities or effusions. Hepatobiliary: Multiple gallstones layering within the gallbladder. No focal hepatic abnormality or biliary ductal dilatation. No visible gallbladder wall thickening. Pancreas: No focal abnormality or ductal dilatation. Spleen: No focal abnormality.  Normal size. Adrenals/Urinary Tract: No adrenal abnormality. No focal renal abnormality. No stones or hydronephrosis. Urinary bladder is unremarkable. Stomach/Bowel: Colonic diverticulosis. No active diverticulitis. Stomach and small bowel decompressed, unremarkable. Vascular/Lymphatic: No evidence of aneurysm or adenopathy. Aortic atherosclerosis. Reproductive: No visible focal abnormality. Other: No free fluid or free air. Musculoskeletal: No acute bony abnormality. IMPRESSION: Colonic diverticulosis crash that cholelithiasis. No CT evidence for cholecystitis. Colonic diverticulosis.  No diverticulitis. Aortic atherosclerosis. No acute findings in the abdomen or pelvis. Electronically Signed   By: Rolm Baptise M.D.   On: 10/02/2018 19:57   Mr Abdomen Mrcp Wo Contrast  Result Date: 10/04/2018 CLINICAL DATA:  Jaundice, abdominal pain. End stage renal disease. EXAM: MRI ABDOMEN WITHOUT CONTRAST  (INCLUDING MRCP) TECHNIQUE: Multiplanar multisequence MR imaging of the abdomen was performed. Heavily T2-weighted images of the biliary and pancreatic ducts were obtained, and three-dimensional MRCP images were rendered by post processing. COMPARISON:  Multiple exams, including CT and ultrasound exams from 09/08/120 FINDINGS: Lower chest: Mild cardiomegaly. Hepatobiliary: Multiple gallstones are present in the gallbladder, the largest approximately 1.1 cm in diameter. Mild gallbladder wall thickening is present no biliary dilatation is identified. Subject to the limitations of motion artifact, no filling defect is identified in the biliary  tree to suggest choledocholithiasis. There is reduced hepatic and splenic signal on inphase images compared to out of phase images, raising the possibility of mild hemochromatosis. No significant focal liver lesion on today's noncontrast exam. Pancreas:  Pancreas divisum.  Otherwise unremarkable. Spleen: Dropout of signal in the spleen on inphase images raising the possibility of hemosiderosis. The spleen measures 13.4 by 6.0 by 14.1 cm (volume = 590 cm^3). Adrenals/Urinary Tract: Mild bilateral renal atrophy. Adrenal glands unremarkable. Stomach/Bowel: Small diverticulum of the transverse duodenum without signs of inflammation. Widespread colonic diverticulosis. Vascular/Lymphatic:  Aortoiliac atherosclerotic vascular disease. Other:  No supplemental non-categorized findings. Musculoskeletal: Unremarkable IMPRESSION: 1. Cholelithiasis and gallbladder wall thickening. Correlate clinically in assessing for acute cholecystitis. 2. No biliary dilatation or appreciable choledocholithiasis. 3. There is some dropout of signal in both the liver and spleen on inphase images, potentially from hemosiderosis or hemochromatosis. 4. Pancreas divisum. 5. Colonic diverticulosis. 6.  Aortic Atherosclerosis (ICD10-I70.0). Electronically Signed   By: Van Clines M.D.   On: 10/04/2018 08:08   Mr 3d Recon At Scanner  Result Date: 10/04/2018 CLINICAL DATA:  Jaundice, abdominal pain. End stage renal disease. EXAM: MRI ABDOMEN WITHOUT CONTRAST  (INCLUDING MRCP) TECHNIQUE: Multiplanar multisequence MR imaging of the abdomen was performed. Heavily T2-weighted images of the biliary and pancreatic ducts were obtained, and three-dimensional MRCP images were rendered by post processing. COMPARISON:  Multiple exams, including CT and ultrasound exams from 09/08/120 FINDINGS: Lower chest: Mild cardiomegaly. Hepatobiliary: Multiple gallstones are present in the gallbladder, the largest approximately 1.1 cm  in diameter. Mild gallbladder  wall thickening is present no biliary dilatation is identified. Subject to the limitations of motion artifact, no filling defect is identified in the biliary tree to suggest choledocholithiasis. There is reduced hepatic and splenic signal on inphase images compared to out of phase images, raising the possibility of mild hemochromatosis. No significant focal liver lesion on today's noncontrast exam. Pancreas:  Pancreas divisum.  Otherwise unremarkable. Spleen: Dropout of signal in the spleen on inphase images raising the possibility of hemosiderosis. The spleen measures 13.4 by 6.0 by 14.1 cm (volume = 590 cm^3). Adrenals/Urinary Tract: Mild bilateral renal atrophy. Adrenal glands unremarkable. Stomach/Bowel: Small diverticulum of the transverse duodenum without signs of inflammation. Widespread colonic diverticulosis. Vascular/Lymphatic:  Aortoiliac atherosclerotic vascular disease. Other:  No supplemental non-categorized findings. Musculoskeletal: Unremarkable IMPRESSION: 1. Cholelithiasis and gallbladder wall thickening. Correlate clinically in assessing for acute cholecystitis. 2. No biliary dilatation or appreciable choledocholithiasis. 3. There is some dropout of signal in both the liver and spleen on inphase images, potentially from hemosiderosis or hemochromatosis. 4. Pancreas divisum. 5. Colonic diverticulosis. 6.  Aortic Atherosclerosis (ICD10-I70.0). Electronically Signed   By: Van Clines M.D.   On: 10/04/2018 08:08   Dg Abdomen Acute W/chest  Result Date: 10/02/2018 CLINICAL DATA: Nausea and vomiting EXAM: DG ABDOMEN ACUTE W/ 1V CHEST COMPARISON:  Chest x-ray 07/06/2016 FINDINGS: Single-view chest demonstrates no focal airspace disease or effusion. Cardiomediastinal silhouette within normal limits. No pneumothorax. No free air beneath the diaphragm. Borderline to mild gaseous distention of central small bowel loops with scattered colon gas present. IMPRESSION: 1. No radiographic evidence for  acute cardiopulmonary abnormality. 2. Borderline to mild gaseous dilatation of central small bowel though with scattered colon gas present, findings could be secondary to enteritis or ileus, however partial or developing bowel obstruction could also produce this appearance. Electronically Signed   By: Donavan Foil M.D.   On: 10/02/2018 18:58   Nm Hepato Biliary Leak  Result Date: 10/19/2018 CLINICAL DATA:  Nausea and vomiting following cholecystectomy. Laparoscopic cholecystectomy on 10/04/2018. EXAM: NUCLEAR MEDICINE HEPATOBILIARY IMAGING TECHNIQUE: Sequential images of the abdomen were obtained out to 60 minutes following intravenous administration of radiopharmaceutical. RADIOPHARMACEUTICALS:  5.4 mCi Tc-6m  Choletec IV COMPARISON:  CT 10/18/2018 FINDINGS: Prompt clearance of radiotracer from the blood pool and homogeneous uptake in liver. There is slow transit of radiotracer from the hepatic parenchyma into the bile ducts. The common bile duct is only evident by 85 minutes. By 120 minutes small amount of radioactive bile does entered the small bowel. No evidence of extra hepatic accumulation of radiotracer to suggest bile leak. IMPRESSION: 1. No evidence of bile leak. 2. Poor transient of radiotracer from the hepatic parenchyma suggest cholestasis versus hepatitis. 3. Patent common bile duct. Electronically Signed   By: Suzy Bouchard M.D.   On: 10/19/2018 17:41   US Abdomen Limited Ruq  Result Date: 10/02/2018 CLINICAL DATA:  Elevated LFTs EXAM: ULTRASOUND ABDOMEN LIMITED RIGHT UPPER QUADRANT COMPARISON:  CT 10/02/2018 FINDINGS: Gallbladder: Sludge in multiple small stones in the gallbladder measuring up to 1.4 cm. Increased wall thickness at 5.7 mm. Negative sonographic Murphy. Common bile duct: Diameter: 2.9 mm Liver: Increased hepatic echogenicity without focal hepatic abnormality. Portal vein is patent on color Doppler imaging with normal direction of blood flow towards the liver. Other: None.  IMPRESSION: 1. Sludge and stones within the gallbladder with increased wall thickness but negative sonographic Murphy. Findings are nonspecific and could be secondary to acute or chronic cholecystitis, liver disease, or  edema forming states. If acute gallbladder disease remains a concern, further evaluation with hepatobiliary nuclear medicine imaging could be obtained. 2. Slightly echogenic liver suggesting steatosis Electronically Signed   By: Donavan Foil M.D.   On: 10/02/2018 21:35       Subjective: Patient seen and examined at bedside.  Denies overnight fever, nausea or vomiting.  States that his abdominal pain has much improved.  He is tolerating diet.  Wants to go home. Discharge Exam: Vitals:   10/22/18 0517 10/22/18 0900  BP: (!) 168/87 (!) 159/78  Pulse: 79 80  Resp: 18 18  Temp: 98.1 F (36.7 C) 98.2 F (36.8 C)  SpO2: 96% 98%    General: Pt is alert, awake, not in acute distress Cardiovascular: rate controlled, S1/S2 + Respiratory: bilateral decreased breath sounds at bases Abdominal: Soft, NT, ND, bowel sounds + Extremities: Trace edema, no cyanosis    The results of significant diagnostics from this hospitalization (including imaging, microbiology, ancillary and laboratory) are listed below for reference.     Microbiology: Recent Results (from the past 240 hour(s))  SARS CORONAVIRUS 2 (TAT 6-24 HRS) Nasopharyngeal Nasopharyngeal Swab     Status: None   Collection Time: 10/18/18  9:22 PM   Specimen: Nasopharyngeal Swab  Result Value Ref Range Status   SARS Coronavirus 2 NEGATIVE NEGATIVE Final    Comment: (NOTE) SARS-CoV-2 target nucleic acids are NOT DETECTED. The SARS-CoV-2 RNA is generally detectable in upper and lower respiratory specimens during the acute phase of infection. Negative results do not preclude SARS-CoV-2 infection, do not rule out co-infections with other pathogens, and should not be used as the sole basis for treatment or other patient  management decisions. Negative results must be combined with clinical observations, patient history, and epidemiological information. The expected result is Negative. Fact Sheet for Patients: SugarRoll.be Fact Sheet for Healthcare Providers: https://www.woods-mathews.com/ This test is not yet approved or cleared by the [place] FDA and  has been authorized for detection and/or diagnosis of SARS-CoV-2 by FDA under an Emergency Use Authorization (EUA). This EUA will remain  in effect (meaning this test can be used) for the duration of the COVID-19 declaration under Section 56 4(b)(1) of the Act, 21 U.S.C. section 360bbb-3(b)(1), unless the authorization is terminated or revoked sooner. Performed at [facility], [place] [address] [place], [place] [address]   Culture, blood (routine x 2)     Status: None (Preliminary result)   Collection Time: 10/18/18 11:59 PM   Specimen: BLOOD RIGHT HAND  Result Value Ref Range Status   Specimen Description BLOOD RIGHT HAND  Final   Special Requests AEROBIC BOTTLE ONLY Blood Culture adequate volume  Final   Culture   Final    NO GROWTH 2 DAYS Performed at [facility], [place] [address], [place], [place] [address]    Report Status PENDING  Incomplete  Culture, blood (routine x 2)     Status: None (Preliminary result)   Collection Time: 10/19/18 12:04 AM   Specimen: BLOOD RIGHT FOREARM  Result Value Ref Range Status   Specimen Description BLOOD RIGHT FOREARM  Final   Special Requests   Final    BOTTLES DRAWN AEROBIC AND ANAEROBIC Blood Culture adequate volume   Culture   Final    NO GROWTH 2 DAYS Performed at [facility], [place] [address] [place], [place] [address]    Report Status PENDING  Incomplete     Labs: BNP (last 3 results) No results  for input(s): BNP in the last 8760 hours. Basic Metabolic Panel: Recent Labs  Lab 10/18/18 1440 10/19/18 0321 10/20/18 0528  10/21/18 0617 10/22/18 0426  NA 134* 137 133* 135 134*  K 4.9 3.6 3.7 3.6 3.9  CL 91* 94* 93* 93* 93*  CO2 29 32 27 26 24   GLUCOSE 331* 192* 164* 195* 233*  BUN 25* 28* 20 36* 47*  CREATININE 4.31* 4.87* 5.24* 8.47* 10.92*  CALCIUM 9.0 8.9 9.0 8.8* 8.5*   Liver Function Tests: Recent Labs  Lab 10/18/18 1440 10/19/18 0321 10/20/18 0528 10/21/18 0617 10/22/18 0426  AST 499* 499* 251* 141* 122*  ALT 579* 592* 495* 334* 276*  ALKPHOS 1,404* 1,214* 1,214* 1,138* 1,098*  BILITOT 5.4* 4.0* 3.6* 2.2* 1.8*  PROT 6.6 6.0* 6.3* 6.5 6.1*  ALBUMIN 3.0* 2.6* 2.5* 2.5* 2.4*   Recent Labs  Lab 10/18/18 1440 10/18/18 2358  LIPASE 57* 45   No results for input(s): AMMONIA in the last 168 hours. CBC: Recent Labs  Lab 10/18/18 1440 10/19/18 0321 10/20/18 0528 10/21/18 0617 10/22/18 0426  WBC 8.8 4.5 5.5 4.7 5.2  NEUTROABS  --   --   --  3.4 3.7  HGB 12.5* 9.7* 10.5* 10.7* 10.5*  HCT 36.5* 29.0* 31.1* 31.2* 31.0*  MCV 87.3 90.1 88.4 87.6 88.8  PLT 204 200 222 234 243   Cardiac Enzymes: No results for input(s): CKTOTAL, CKMB, CKMBINDEX, TROPONINI in the last 168 hours. BNP: Invalid input(s): POCBNP CBG: Recent Labs  Lab 10/21/18 1606 10/21/18 2004 10/22/18 0014 10/22/18 0442 10/22/18 0726  GLUCAP 194* 178* 214* 213* 197*   D-Dimer No results for input(s): DDIMER in the last 72 hours. Hgb A1c No results for input(s): HGBA1C in the last 72 hours. Lipid Profile No results for input(s): CHOL, HDL, LDLCALC, TRIG, CHOLHDL, LDLDIRECT in the last 72 hours. Thyroid function studies No results for input(s): TSH, T4TOTAL, T3FREE, THYROIDAB in the last 72 hours.  Invalid input(s): FREET3 Anemia work up Recent Labs    10/20/18 0528  FERRITIN 1,823*  TIBC 241*  IRON 55   Urinalysis    Component Value Date/Time   COLORURINE AMBER (A) 10/19/2018 0501   APPEARANCEUR CLEAR 10/19/2018 0501   LABSPEC 1.028 10/19/2018 0501   PHURINE 6.0 10/19/2018 0501   GLUCOSEU >=500  (A) 10/19/2018 0501   HGBUR SMALL (A) 10/19/2018 0501   BILIRUBINUR NEGATIVE 10/19/2018 0501   KETONESUR NEGATIVE 10/19/2018 0501   PROTEINUR >=300 (A) 10/19/2018 0501   NITRITE NEGATIVE 10/19/2018 0501   LEUKOCYTESUR NEGATIVE 10/19/2018 0501   Sepsis Labs Invalid input(s): PROCALCITONIN,  WBC,  LACTICIDVEN Microbiology Recent Results (from the past 240 hour(s))  SARS CORONAVIRUS 2 (TAT 6-24 HRS) Nasopharyngeal Nasopharyngeal Swab     Status: None   Collection Time: 10/18/18  9:22 PM   Specimen: Nasopharyngeal Swab  Result Value Ref Range Status   SARS Coronavirus 2 NEGATIVE NEGATIVE Final    Comment: (NOTE) SARS-CoV-2 target nucleic acids are NOT DETECTED. The SARS-CoV-2 RNA is generally detectable in upper and lower respiratory specimens during the acute phase of infection. Negative results do not preclude SARS-CoV-2 infection, do not rule out co-infections with other pathogens, and should not be used as the sole basis for treatment or other patient management decisions. Negative results must be combined with clinical observations, patient history, and epidemiological information. The expected result is Negative. Fact Sheet for Patients: SugarRoll.be Fact Sheet for Healthcare Providers: https://www.woods-mathews.com/ This test is not yet approved or cleared by the [place]  FDA and  has been authorized for detection and/or diagnosis of SARS-CoV-2 by FDA under an Emergency Use Authorization (EUA). This EUA will remain  in effect (meaning this test can be used) for the duration of the COVID-19 declaration under Section 56 4(b)(1) of the Act, 21 U.S.C. section 360bbb-3(b)(1), unless the authorization is terminated or revoked sooner. Performed at [facility], [place] [address] [place], [place] [address]   Culture, blood (routine x 2)     Status: None (Preliminary result)   Collection Time: 10/18/18 11:59 PM   Specimen: BLOOD  RIGHT HAND  Result Value Ref Range Status   Specimen Description BLOOD RIGHT HAND  Final   Special Requests AEROBIC BOTTLE ONLY Blood Culture adequate volume  Final   Culture   Final    NO GROWTH 2 DAYS Performed at [facility], [place] [address], [place], [place] [address]    Report Status PENDING  Incomplete  Culture, blood (routine x 2)     Status: None (Preliminary result)   Collection Time: 10/19/18 12:04 AM   Specimen: BLOOD RIGHT FOREARM  Result Value Ref Range Status   Specimen Description BLOOD RIGHT FOREARM  Final   Special Requests   Final    BOTTLES DRAWN AEROBIC AND ANAEROBIC Blood Culture adequate volume   Culture   Final    NO GROWTH 2 DAYS Performed at [facility], [place] [address] [place],  [address]    Report Status PENDING  Incomplete     Time coordinating discharge: 35 minutes  SIGNED:   Aline August, MD  [facility] 10/22/2018, 10:19 AM

## 2018-10-22 NOTE — Progress Notes (Signed)
[facility] Progress Note   Subjective:    Seen up and walking halls with PT. Feels much better. LFTs trending down. Plans for discharge today.   Objective Vitals:   10/21/18 1636 10/21/18 2004 10/22/18 0517 10/22/18 0900  BP: (!) 169/77 (!) 160/76 (!) 168/87 (!) 159/78  Pulse: 80 77 79 80  Resp: 18 16 18 18   Temp: 98.8 F (37.1 C) 97.9 F (36.6 C) 98.1 F (36.7 C) 98.2 F (36.8 C)  TempSrc: Oral Oral Oral Oral  SpO2: 95% 96% 96% 98%  Weight:      Height:       Physical Exam General: Well developed, alert male in NAD Heart: RRR no murmurs, rubs or gallops Lungs: CTA bilaterally without wheezing, rhonchi or rales Abdomen: Soft, non-tender, non-distended. +BS Extremities: No peripheral edema Dialysis Access:  LUE AVF + bruit  Additional Objective Labs: Basic Metabolic Panel: Recent Labs  Lab 10/20/18 0528 10/21/18 0617 10/22/18 0426  NA 133* 135 134*  K 3.7 3.6 3.9  CL 93* 93* 93*  CO2 27 26 24   GLUCOSE 164* 195* 233*  BUN 20 36* 47*  CREATININE 5.24* 8.47* 10.92*  CALCIUM 9.0 8.8* 8.5*   Liver Function Tests: Recent Labs  Lab 10/20/18 0528 10/21/18 0617 10/22/18 0426  AST 251* 141* 122*  ALT 495* 334* 276*  ALKPHOS 1,214* 1,138* 1,098*  BILITOT 3.6* 2.2* 1.8*  PROT 6.3* 6.5 6.1*  ALBUMIN 2.5* 2.5* 2.4*   Recent Labs  Lab 10/18/18 1440 10/18/18 2358  LIPASE 57* 45   CBC: Recent Labs  Lab 10/18/18 1440 10/19/18 0321 10/20/18 0528 10/21/18 0617 10/22/18 0426  WBC 8.8 4.5 5.5 4.7 5.2  NEUTROABS  --   --   --  3.4 3.7  HGB 12.5* 9.7* 10.5* 10.7* 10.5*  HCT 36.5* 29.0* 31.1* 31.2* 31.0*  MCV 87.3 90.1 88.4 87.6 88.8  PLT 204 200 222 234 243   Blood Culture    Component Value Date/Time   SDES BLOOD RIGHT FOREARM 10/19/2018 0004   SPECREQUEST  10/19/2018 0004    BOTTLES DRAWN AEROBIC AND ANAEROBIC Blood Culture adequate volume   CULT  10/19/2018 0004    NO GROWTH 2 DAYS Performed at [facility], [place] [address], [place], [place] [address]    REPTSTATUS PENDING 10/19/2018 0004    Cardiac Enzymes: No results for input(s): CKTOTAL, CKMB, CKMBINDEX, TROPONINI in the last 168 hours. CBG: Recent Labs  Lab 10/21/18 1606 10/21/18 2004 10/22/18 0014 10/22/18 0442 10/22/18 0726  GLUCAP 194* 178* 214* 213* 197*   Iron Studies:  Recent Labs    10/20/18 0528  IRON 55  TIBC 241*  FERRITIN 1,823*   @lablastinr3 @ Studies/Results: No results found. Medications: . sodium chloride     . Chlorhexidine Gluconate Cloth  6 each Topical Q0600  . cinacalcet  60 mg Oral Q supper  . darbepoetin (ARANESP) injection - DIALYSIS  25 mcg Intravenous Q Fri-HD  . doxercalciferol  8 mcg Intravenous Q M,W,F-HD  . feeding supplement (PRO-STAT SUGAR FREE 64)  30 mL Oral BID  . insulin aspart  0-9 Units Subcutaneous Q4H  . sevelamer carbonate  1,600 mg Oral TID WC  . sodium chloride flush  3 mL Intravenous Once  . sodium chloride flush  3 mL Intravenous Q12H    [facility], 15g needles, Dialyzer 180NRe, BFR 400, DFR 800, T 4hr 87min, EDW 90.5kg Heparin 2600 unit bolus Hectorol 10 mcg IV  q HD Sensipar 60 mg daily Renvela 800mg  3 tabs PO TID with meals  Assessment/Plan: 1. N/V with elevated LFTs: S/p lap cholecystectomy on 10/04/2018. CT showed fluid collection along inferior aspect of liver on CT. On zosyn. HIDA scan negative for bile leak, LFTs are trending down today. Denies any further fevers, nausea or vomiting. Management per surgery/GI. Will f/u with GI as outpatient 10/1.  2. ESRD:MWF at [facility]. K+ 3.9. Plan for next HD on Monday, 9/28. Will resume at outpatient center today.  3. Hypertension/volume:No evidence of volume overload on exam. Below EDW but has been losing weight lately due to GI issues. Will need lower EDW at discharge. 4. Anemia:Not on ESA as outpatient. Started on aranesp q Friday. Hgb now 10.5.  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 5. Metabolic bone disease:Resume renvela. Corr Ca 9.8  Will continue hectorol at reduced dose of 8 mcg, continue sensipar.  6. Nutrition:Albumin 2.5. Will start pro-stat supplement.  7. T2DM:A1c above goal at 8.4. Continue insulin management per primary team.     Lynnda Child PA-C [facility] Pager [phone] 10/22/2018,10:20 AM

## 2018-10-22 NOTE — Progress Notes (Signed)
Patient has HD MWF at [facility] clinic. His seat time is 11:30am. Renal Navigator contacted [facility] and notified them of patient's discharge today. He will be treated at OP HD clinic as long as he arrives by 12:00pm.  Agricultural consultant notified. Renal Navigator met with patient who states his mother will come get him at discharge and take him to OP HD clinic.  Alphonzo Cruise, [place] Renal Navigator [phone]

## 2018-10-22 NOTE — Progress Notes (Signed)
     Progress Note    ASSESSMENT AND PLAN:   1. Abnormal liver tests. Progression of chronically elevated alk phos, new elevation of bilirubin and liver enzymes. No evidence for bile leak post cholecystectomy. Multiple labs for markers of chronic liver disease as well as acute etiologies still pending. Liver tests slowly trending down. --Stable for discharge from GI standpoint.  --Labs at our office on Thursday 10/1 --Follow up with Dr. Silverio Decamp 10 /8 at 8:30am   2. ESRD, on HD   SUBJECTIVE   No complaints. No abdominal pain.    OBJECTIVE:     Vital signs in last 24 hours: Temp:  [97.9 F (36.6 C)-98.8 F (37.1 C)] 98.1 F (36.7 C) (09/28 0517) Pulse Rate:  [77-86] 79 (09/28 0517) Resp:  [16-18] 18 (09/28 0517) BP: (160-169)/(76-87) 168/87 (09/28 0517) SpO2:  [95 %-96 %] 96 % (09/28 0517) Last BM Date: 10/21/18 General:   Alert, in NAD EENT:  Normal hearing, + icteric sclera Heart:  Regular rate and rhythm;  No lower extremity edema   Pulm: Normal respiratory effort Abdomen:  Soft, protuberant, nontender.  Normal bowel sounds.          Neurologic:  Alert and  oriented x4;  grossly normal neurologically. Psych:  Pleasant, cooperative.  Normal mood and affect.   Intake/Output from previous day: 09/27 0701 - 09/28 0700 In: 720 [P.O.:720] Out: 0  Intake/Output this shift: No intake/output data recorded.  Lab Results: Recent Labs    10/20/18 0528 10/21/18 0617 10/22/18 0426  WBC 5.5 4.7 5.2  HGB 10.5* 10.7* 10.5*  HCT 31.1* 31.2* 31.0*  PLT 222 234 243   BMET Recent Labs    10/20/18 0528 10/21/18 0617 10/22/18 0426  NA 133* 135 134*  K 3.7 3.6 3.9  CL 93* 93* 93*  CO2 '27 26 24  '$ GLUCOSE 164* 195* 233*  BUN 20 36* 47*  CREATININE 5.24* 8.47* 10.92*  CALCIUM 9.0 8.8* 8.5*   LFT Recent Labs    10/22/18 0426  PROT 6.1*  ALBUMIN 2.4*  AST 122*  ALT 276*  ALKPHOS 1,098*  BILITOT 1.8*   PT/INR Recent Labs    10/22/18 0426  LABPROT 12.8   INR 1.0   Hepatitis Panel No results for input(s): HEPBSAG, HCVAB, HEPAIGM, HEPBIGM in the last 72 hours.  No results found.    Principal Problem:   Nausea & vomiting Active Problems:   Chronic diastolic CHF (congestive heart failure) (HCC)   Diabetes mellitus type II, non insulin dependent (HCC)   ESRD (end stage renal disease) on dialysis (HCC)   Abnormal LFTs   Intraabdominal fluid collection   Intractable nausea and vomiting     LOS: 3 days   Tye [place] ,NP 10/22/2018, 9:14 AM

## 2018-10-23 LAB — EPSTEIN-BARR VIRUS VCA, IGM: EBV VCA IgM: <36 U/mL (ref 0.0–35.9)

## 2018-10-23 LAB — EPSTEIN-BARR VIRUS VCA, IGG: EBV VCA IgG: 157 U/mL — ABNORMAL HIGH (ref 0.0–17.9)

## 2018-10-23 LAB — CMV IGM: CMV IgM: <30 AU/mL (ref 0.0–29.9)

## 2018-10-24 LAB — CULTURE, BLOOD (ROUTINE X 2)
Culture: NO GROWTH
Culture: NO GROWTH
Special Requests: ADEQUATE
Special Requests: ADEQUATE

## 2018-10-25 ENCOUNTER — Other Ambulatory Visit (INDEPENDENT_AMBULATORY_CARE_PROVIDER_SITE_OTHER): Payer: Medicare Other

## 2018-10-25 DIAGNOSIS — R945 Abnormal results of liver function studies: Secondary | ICD-10-CM | POA: Diagnosis not present

## 2018-10-25 DIAGNOSIS — R7989 Other specified abnormal findings of blood chemistry: Secondary | ICD-10-CM

## 2018-10-25 HISTORY — PX: GALLBLADDER SURGERY: SHX652

## 2018-10-25 LAB — HEPATIC FUNCTION PANEL
ALT: 270 U/L — ABNORMAL HIGH (ref 0–53)
AST: 128 U/L — ABNORMAL HIGH (ref 0–37)
Albumin: 3.7 g/dL (ref 3.5–5.2)
Alkaline Phosphatase: 1139 U/L — ABNORMAL HIGH (ref 39–117)
Bilirubin, Direct: 0.7 mg/dL — ABNORMAL HIGH (ref 0.0–0.3)
Total Bilirubin: 1.5 mg/dL — ABNORMAL HIGH (ref 0.2–1.2)
Total Protein: 7 g/dL (ref 6.0–8.3)

## 2018-11-01 ENCOUNTER — Encounter: Payer: Self-pay | Admitting: Gastroenterology

## 2018-11-01 ENCOUNTER — Other Ambulatory Visit (INDEPENDENT_AMBULATORY_CARE_PROVIDER_SITE_OTHER): Payer: Medicare Other

## 2018-11-01 ENCOUNTER — Ambulatory Visit (INDEPENDENT_AMBULATORY_CARE_PROVIDER_SITE_OTHER): Payer: Medicare Other | Admitting: Gastroenterology

## 2018-11-01 ENCOUNTER — Other Ambulatory Visit: Payer: Self-pay

## 2018-11-01 VITALS — BP 126/62 | HR 74 | Temp 97.4°F | Ht 68.0 in | Wt 192.0 lb

## 2018-11-01 DIAGNOSIS — R7989 Other specified abnormal findings of blood chemistry: Secondary | ICD-10-CM

## 2018-11-01 DIAGNOSIS — R945 Abnormal results of liver function studies: Secondary | ICD-10-CM

## 2018-11-01 DIAGNOSIS — N186 End stage renal disease: Secondary | ICD-10-CM

## 2018-11-01 DIAGNOSIS — K3184 Gastroparesis: Secondary | ICD-10-CM

## 2018-11-01 DIAGNOSIS — R112 Nausea with vomiting, unspecified: Secondary | ICD-10-CM | POA: Diagnosis not present

## 2018-11-01 DIAGNOSIS — Z992 Dependence on renal dialysis: Secondary | ICD-10-CM

## 2018-11-01 DIAGNOSIS — Z9049 Acquired absence of other specified parts of digestive tract: Secondary | ICD-10-CM

## 2018-11-01 DIAGNOSIS — R748 Abnormal levels of other serum enzymes: Secondary | ICD-10-CM

## 2018-11-01 LAB — CBC WITH DIFFERENTIAL/PLATELET
Basophils Absolute: 0 10*3/uL (ref 0.0–0.1)
Basophils Relative: 1.6 % (ref 0.0–3.0)
Eosinophils Absolute: 0.1 10*3/uL (ref 0.0–0.7)
Eosinophils Relative: 2.7 % (ref 0.0–5.0)
HCT: 31.1 % — ABNORMAL LOW (ref 39.0–52.0)
Hemoglobin: 10.4 g/dL — ABNORMAL LOW (ref 13.0–17.0)
Lymphocytes Relative: 15.1 % (ref 12.0–46.0)
Lymphs Abs: 0.5 10*3/uL — ABNORMAL LOW (ref 0.7–4.0)
MCHC: 33.6 g/dL (ref 30.0–36.0)
MCV: 88.6 fl (ref 78.0–100.0)
Monocytes Absolute: 0.3 10*3/uL (ref 0.1–1.0)
Monocytes Relative: 8.5 % (ref 3.0–12.0)
Neutro Abs: 2.3 10*3/uL (ref 1.4–7.7)
Neutrophils Relative %: 72.1 % (ref 43.0–77.0)
Platelets: 182 10*3/uL (ref 150.0–400.0)
RBC: 3.5 Mil/uL — ABNORMAL LOW (ref 4.22–5.81)
RDW: 15.5 % (ref 11.5–15.5)
WBC: 3.1 10*3/uL — ABNORMAL LOW (ref 4.0–10.5)

## 2018-11-01 LAB — COMPREHENSIVE METABOLIC PANEL
ALT: 106 U/L — ABNORMAL HIGH (ref 0–53)
AST: 58 U/L — ABNORMAL HIGH (ref 0–37)
Albumin: 3.5 g/dL (ref 3.5–5.2)
Alkaline Phosphatase: 1072 U/L — ABNORMAL HIGH (ref 39–117)
BUN: 18 mg/dL (ref 6–23)
CO2: 34 mEq/L — ABNORMAL HIGH (ref 19–32)
Calcium: 8.7 mg/dL (ref 8.4–10.5)
Chloride: 92 mEq/L — ABNORMAL LOW (ref 96–112)
Creatinine, Ser: 3.96 mg/dL — ABNORMAL HIGH (ref 0.40–1.50)
GFR: 15.6 mL/min — ABNORMAL LOW (ref >60.00)
Glucose, Bld: 247 mg/dL — ABNORMAL HIGH (ref 70–99)
Potassium: 3.4 mEq/L — ABNORMAL LOW (ref 3.5–5.1)
Sodium: 136 mEq/L (ref 135–145)
Total Bilirubin: 3.7 mg/dL — ABNORMAL HIGH (ref 0.2–1.2)
Total Protein: 6.9 g/dL (ref 6.0–8.3)

## 2018-11-01 LAB — GAMMA GT: GGT: 591 U/L — ABNORMAL HIGH (ref 7–51)

## 2018-11-01 LAB — PROTIME-INR
INR: 1 ratio (ref 0.8–1.0)
Prothrombin Time: 11.4 s (ref 9.6–13.1)

## 2018-11-01 LAB — SEDIMENTATION RATE: Sed Rate: 97 mm/hr — ABNORMAL HIGH (ref 0–20)

## 2018-11-01 LAB — AMMONIA: Ammonia: 33 umol/L (ref 11–35)

## 2018-11-01 LAB — FERRITIN: Ferritin: 1020.2 ng/mL — ABNORMAL HIGH (ref 22.0–322.0)

## 2018-11-01 NOTE — Progress Notes (Signed)
Edgar Brewer    [id number]    03-28-1960  Primary Care Physician:Lott, Elwin Sleight, MD  Referring Physician: No referring provider defined for this encounter.   Chief complaint: Abnormal LFT  HPI:  66 yr M with h/o ESRD on HD, diabetes, diastolic CHF, here for follow up visit after recent hospitalization. He is accompanied by his mother, lives with her.  He was hospitalized in early Sep 2020 with jaundice, underwent cholecystectomy for possible gallbladder disease even though no clear evidence of cholecystitis on imaging. Pathology specimen with evidence of chronic cholecystitis and cholelithiasis.  He was readmitted on 10/18/2018 with nausea and vomiting, elevated alk phos, transaminases and ferritin >1800, saturation ratio of 23%.   CT abd & pelvis with 3x2 cm fluid pocket inferior surface of liver possible seroma. No evidence of bile leak on HIDA scan.  Of note he never had elevated Ferritin in the past, <200 in Jan 2019 and he underwent EGD and colonoscopy for possible iron deficiency anemia in 2018  SARS COV-2 negative, Hepatitis A, B, C and CMV negative. EBV IgG detectable with low IgM Negative for PSC and PBS with low titer for anti Sm AB and AMA  L eye hemorrhagic, s/p injection for ?retinal detachment or bleeding last week  He continues to have generalized fatigue and intermittent nausea, had an episode of vomiting last week but none since then. He is loosing weight. Alk phos continues to be significantly elevated  Hgb A1C elevated at 8.4 on 10/19/2018  Hepatic Function Latest Ref Rng & Units 10/25/2018 10/22/2018 10/21/2018  Total Protein 6.0 - 8.3 g/dL 7.0 6.1(L) 6.5  Albumin 3.5 - 5.2 g/dL 3.7 2.4(L) 2.5(L)  AST 0 - 37 U/L 128(H) 122(H) 141(H)  ALT 0 - 53 U/L 270(H) 276(H) 334(H)  Alk Phosphatase 39 - 117 U/L 1,139(H) 1,098(H) 1,138(H)  Total Bilirubin 0.2 - 1.2 mg/dL 1.5(H) 1.8(H) 2.2(H)  Bilirubin, Direct 0.0 - 0.3 mg/dL 0.7(H) - -    Outpatient  Encounter Medications as of 11/01/2018  Medication Sig  . b complex-vitamin c-folic acid (NEPHRO-VITE) 0.8 MG TABS tablet Take 1 tablet by mouth daily.  . cinacalcet (SENSIPAR) 30 MG tablet Take 2 tablets (60 mg total) by mouth daily with supper.  Marland Kitchen doxercalciferol (HECTOROL) 4 MCG/2ML injection Inject 5 mLs (10 mcg total) into the vein every Monday, Wednesday, and Friday with hemodialysis.  [place] Kitchen gabapentin (NEURONTIN) 300 MG capsule Take 300 mg by mouth 2 (two) times daily. Morning and Bedtime  . ibuprofen (ADVIL) 200 MG tablet Take 200-800 mg by mouth every 8 (eight) hours as needed for headache or mild pain.  Mar[place] Kitchen linagliptin (TRADJENTA) 5 MG TABS tablet Take 5 mg by mouth daily.  [place] Kitchen omeprazole (PRILOSEC) 20 MG capsule Take 20 mg by mouth daily before breakfast.   . sevelamer carbonate (RENVELA) 800 MG tablet Take 2 tablets (1,600 mg total) by mouth 3 (three) times daily with meals.  . ondansetron (ZOFRAN) 4 MG tablet Take 1 tablet (4 mg total) by mouth every 6 (six) hours as needed for nausea. (Patient not taking: Reported on 11/01/2018)  . traMADol (ULTRAM) 50 MG tablet Take 1 tablet (50 mg total) by mouth every 12 (twelve) hours as needed for moderate pain. (Patient not taking: Reported on 11/01/2018)  . [DISCONTINUED] Docusate Sodium (STOOL SOFTENER LAXATIVE PO) Take 1-3 capsules by mouth at bedtime.    No facility-administered encounter medications on file as of 11/01/2018.     Allergies as  of 11/01/2018 - Review Complete 11/01/2018  Allergen Reaction Noted  . Codeine Hives, Swelling, and Other (See Comments) 08/08/2013    Past Medical History:  Diagnosis Date  . Acute on chronic combined systolic and diastolic CHF, NYHA class 1 ([place]) 01/13/2015  . Arthritis   . Chronic kidney disease    MWF [name].  . Diabetes mellitus type 2 with complications ([facility])    Previous insulin, non-insulin requiring noted 09/2018.  Complications include retinopathy, peripheral neuropathy.  Marland Kitchen GERD  (gastroesophageal reflux disease)    at times  . Head injury with loss of consciousness ([place])   . History of kidney stones   . Hypertension   . Neuropathy   . Pulmonary hypertension (HCC)    PA peak pressure 41 mmHg 03/04/16 echo  . Varicose veins of lower extremities with complications 56/25/6389    Past Surgical History:  Procedure Laterality Date  . AV FISTULA PLACEMENT Left 06/01/2016   Procedure: ARTERIOVENOUS (AV) FISTULA CREATION;  Surgeon: [name], MD;  Location: [facility];  Service: Vascular;  Laterality: Left;  . AV FISTULA PLACEMENT Left 08/10/2017   Procedure: BRACHIOCEPHALIC ARTERIOVENOUS (AV) FISTULA CREATION LEFT UPPER EXTREMITY;  Surgeon: [name], MD;  Location: [facility];  Service: Vascular;  Laterality: Left;  . CHOLECYSTECTOMY N/A 10/04/2018   Procedure: LAPAROSCOPIC CHOLECYSTECTOMY;  Surgeon: Kinsinger, Arta Bruce, MD;  Location: [facility];  Service: General;  Laterality: N/A;  . COLONOSCOPY W/ POLYPECTOMY  03/2016   For evaluation of iron deficiency anemia.  Dr. Silverio Decamp.  10 polyps, largest 20 mm.  Pathology: tubular adenomas with no high-grade dysplasia, hyperplastic.  None bleeding internal hemorrhoids.  Pandiverticulosis.  [place] Kitchen ENDOVENOUS ABLATION SAPHENOUS VEIN W/ LASER Left 07-23-2015   endovenous laser ablation left greater saphenous vein by [name]  . ENDOVENOUS ABLATION SAPHENOUS VEIN W/ LASER Right 10/15/2015   EVLA right greater saphenous vein by [name]  . ESOPHAGOGASTRODUODENOSCOPY  03/2016   For evaluation of IDA.  Dr. Silverio Decamp.  Duodenal erythema, pathology benign mucosa, no villous atrophy.    Family History  Problem Relation Age of Onset  . Cancer Father     Social History   Socioeconomic History  . Marital status: Legally Separated    Spouse name: Not on file  . Number of children: Not on file  . Years of education: Not on file  . Highest education level: Not on file  Occupational History  . Not on file  Social Needs  .  Financial resource strain: Not on file  . Food insecurity    Worry: Never true    Inability: Never true  . Transportation needs    Medical: No    Non-medical: No  Tobacco Use  . Smoking status: Former Smoker    Packs/day: 1.00    Years: 45.00    Pack years: 45.00    Types: Cigarettes    Quit date: 01/13/2015    Years since quitting: 3.8  . Smokeless tobacco: Never Used  Substance and Sexual Activity  . Alcohol use: Yes    Alcohol/week: 1.0 standard drinks    Types: 1 Glasses of wine per week    Comment: rare occassion  . Drug use: No  . Sexual activity: Not on file  Lifestyle  . Physical activity    Days per week: Not on file    Minutes per session: Not on file  . Stress: Not on file  Relationships  . Social Herbalist on  phone: Not on file    Gets together: Not on file    Attends religious service: Not on file    Active member of club or organization: Not on file    Attends meetings of clubs or organizations: Not on file    Relationship status: Not on file  . Intimate partner violence    Fear of current or ex partner: Not on file    Emotionally abused: Not on file    Physically abused: Not on file    Forced sexual activity: Not on file  Other Topics Concern  . Not on file  Social History Narrative  . Not on file      Review of systems: Review of Systems  Constitutional: Negative for fever and chills. Positive for lack of energy HENT: Positive for runny nose Eyes: Negative for blurred vision.  Respiratory: Negative for cough, shortness of breath and wheezing.   Cardiovascular: Negative for chest pain and palpitations.  Gastrointestinal: as per HPI Genitourinary: Negative for dysuria, urgency, frequency and hematuria.  Musculoskeletal: Negative for myalgias, back pain and joint pain.  Skin: Negative for itching and rash.  Neurological: Negative for dizziness, tremors, focal weakness, seizures and loss of consciousness.  Endo/Heme/Allergies:  Positive for seasonal allergies.  Psychiatric/Behavioral: Negative for depression, suicidal ideas and hallucinations. Positive for irritability All other systems reviewed and are negative.   Physical Exam: Vitals:   11/01/18 0817  BP: 126/62  Pulse: 74  Temp: (!) 97.4 F (36.3 C)   Body mass index is 29.19 kg/m. Gen:      No acute distress HEENT:  EOMI, sclera icteric Neck:     No masses; no thyromegaly Lungs:    Clear to auscultation bilaterally; normal respiratory effort CV:         Regular rate and rhythm; no murmurs Abd:      + bowel sounds; soft, non-tender; no palpable masses, no distension Ext:    No edema; adequate peripheral perfusion Skin:      Warm and dry; no rash Neuro: alert and oriented x 3, No asterixis Psych: normal mood and affect  Data Reviewed:  Reviewed labs, radiology imaging, old records and pertinent past GI work up   Assessment and Plan/Recommendations:  59 yr M with history of ESRD on HD, diabetes s/p cholecystectomy for cholelithiasis and chronic cholecystitis with persistent LFT abnormality  AST and ALT trending down but alk phos is trending up as well as Bilirubin concerning for possible cholangiopathy or congestive hepatopathy. Post op bile duct injury/ischemia? Vs post infectious vs worsening diastolic CHF  Will request isoenzymes for Alk phos to determine if its all from liver or ESRD/osteodystrophy is playing a role too  Elevated ferritin concerning for acute inflammatory process, recheck ferritin to trend and also ESR  Will continue to trend LFT every 4-6 weeks until return to baseline  Small frequent meals, avoid high fiber and fat diet Maintain steady glycemic state, avoid hyper or hypoglycemia   Follow up office visit in 3 months  40 minutes was spent face-to-face with the patient. Greater than 50% of the time used for counseling as well as treatment plan and follow-up. He had multiple questions which were answered to his  satisfaction  K. Denzil Magnuson , MD    CC: No ref. provider found

## 2018-11-01 NOTE — Patient Instructions (Addendum)
Go to the basement for labs today  Follow up LFT's in 4-6 weeks  Eat Small Frequent meals  AVOID high fiber and fat diets   Follow up in 3 months   If you are age 59 or older, your body mass index should be between 23-30. Your Body mass index is 29.19 kg/m. If this is out of the aforementioned range listed, please consider follow up with your Primary Care Provider.  If you are age 26 or younger, your body mass index should be between 19-25. Your Body mass index is 29.19 kg/m. If this is out of the aformentioned range listed, please consider follow up with your Primary Care Provider.    I appreciate the  opportunity to care for you  Thank You   Harl Bowie , MD

## 2018-11-02 ENCOUNTER — Encounter: Payer: Self-pay | Admitting: Gastroenterology

## 2018-11-02 ENCOUNTER — Other Ambulatory Visit: Payer: Self-pay

## 2018-11-02 DIAGNOSIS — N186 End stage renal disease: Secondary | ICD-10-CM

## 2018-11-02 DIAGNOSIS — R7989 Other specified abnormal findings of blood chemistry: Secondary | ICD-10-CM

## 2018-11-02 DIAGNOSIS — Z992 Dependence on renal dialysis: Secondary | ICD-10-CM

## 2018-11-02 DIAGNOSIS — T782XXS Anaphylactic shock, unspecified, sequela: Secondary | ICD-10-CM | POA: Insufficient documentation

## 2018-11-02 DIAGNOSIS — R945 Abnormal results of liver function studies: Secondary | ICD-10-CM

## 2018-11-03 LAB — ALKALINE PHOSPHATASE, ISOENZYMES
Alkaline Phosphatase: 1299 IU/L (ref 39–117)
BONE FRACTION: 22 % (ref 12–68)
INTESTINAL FRAC.: 1 % (ref 0–18)
LIVER FRACTION: 77 % (ref 13–88)

## 2018-11-08 ENCOUNTER — Other Ambulatory Visit (INDEPENDENT_AMBULATORY_CARE_PROVIDER_SITE_OTHER): Payer: Medicare Other

## 2018-11-08 DIAGNOSIS — Z992 Dependence on renal dialysis: Secondary | ICD-10-CM | POA: Diagnosis not present

## 2018-11-08 DIAGNOSIS — R945 Abnormal results of liver function studies: Secondary | ICD-10-CM | POA: Diagnosis not present

## 2018-11-08 DIAGNOSIS — R7989 Other specified abnormal findings of blood chemistry: Secondary | ICD-10-CM

## 2018-11-08 DIAGNOSIS — N186 End stage renal disease: Secondary | ICD-10-CM | POA: Diagnosis not present

## 2018-11-08 LAB — COMPREHENSIVE METABOLIC PANEL
ALT: 102 U/L — ABNORMAL HIGH (ref 0–53)
AST: 47 U/L — ABNORMAL HIGH (ref 0–37)
Albumin: 3.5 g/dL (ref 3.5–5.2)
Alkaline Phosphatase: 1087 U/L — ABNORMAL HIGH (ref 39–117)
BUN: 18 mg/dL (ref 6–23)
CO2: 34 mEq/L — ABNORMAL HIGH (ref 19–32)
Calcium: 8.7 mg/dL (ref 8.4–10.5)
Chloride: 93 mEq/L — ABNORMAL LOW (ref 96–112)
Creatinine, Ser: 3.09 mg/dL — ABNORMAL HIGH (ref 0.40–1.50)
GFR: 20.77 mL/min — ABNORMAL LOW (ref >60.00)
Glucose, Bld: 247 mg/dL — ABNORMAL HIGH (ref 70–99)
Potassium: 3.5 mEq/L (ref 3.5–5.1)
Sodium: 136 mEq/L (ref 135–145)
Total Bilirubin: 5.1 mg/dL — ABNORMAL HIGH (ref 0.2–1.2)
Total Protein: 6.6 g/dL (ref 6.0–8.3)

## 2018-11-08 LAB — CBC WITH DIFFERENTIAL/PLATELET
Basophils Absolute: 0.1 10*3/uL (ref 0.0–0.1)
Basophils Relative: 1.2 % (ref 0.0–3.0)
Eosinophils Absolute: 0.1 10*3/uL (ref 0.0–0.7)
Eosinophils Relative: 1.8 % (ref 0.0–5.0)
HCT: 30.8 % — ABNORMAL LOW (ref 39.0–52.0)
Hemoglobin: 10.4 g/dL — ABNORMAL LOW (ref 13.0–17.0)
Lymphocytes Relative: 16.3 % (ref 12.0–46.0)
Lymphs Abs: 0.8 10*3/uL (ref 0.7–4.0)
MCHC: 33.6 g/dL (ref 30.0–36.0)
MCV: 88 fl (ref 78.0–100.0)
Monocytes Absolute: 0.3 10*3/uL (ref 0.1–1.0)
Monocytes Relative: 5.4 % (ref 3.0–12.0)
Neutro Abs: 3.6 10*3/uL (ref 1.4–7.7)
Neutrophils Relative %: 75.3 % (ref 43.0–77.0)
Platelets: 240 10*3/uL (ref 150.0–400.0)
RBC: 3.51 Mil/uL — ABNORMAL LOW (ref 4.22–5.81)
RDW: 16.4 % — ABNORMAL HIGH (ref 11.5–15.5)
WBC: 4.7 10*3/uL (ref 4.0–10.5)

## 2018-11-09 ENCOUNTER — Ambulatory Visit: Payer: Medicare Other | Admitting: Cardiovascular Disease

## 2018-11-12 ENCOUNTER — Other Ambulatory Visit: Payer: Self-pay

## 2018-11-12 DIAGNOSIS — R7989 Other specified abnormal findings of blood chemistry: Secondary | ICD-10-CM

## 2018-11-12 DIAGNOSIS — R945 Abnormal results of liver function studies: Secondary | ICD-10-CM

## 2018-11-13 ENCOUNTER — Other Ambulatory Visit: Payer: Self-pay

## 2018-11-13 ENCOUNTER — Ambulatory Visit (HOSPITAL_BASED_OUTPATIENT_CLINIC_OR_DEPARTMENT_OTHER): Payer: Medicare Other

## 2018-11-13 DIAGNOSIS — A419 Sepsis, unspecified organism: Secondary | ICD-10-CM | POA: Diagnosis not present

## 2018-11-13 DIAGNOSIS — I350 Nonrheumatic aortic (valve) stenosis: Secondary | ICD-10-CM | POA: Insufficient documentation

## 2018-11-13 DIAGNOSIS — A4151 Sepsis due to Escherichia coli [E. coli]: Secondary | ICD-10-CM | POA: Diagnosis not present

## 2018-11-14 ENCOUNTER — Telehealth: Payer: Self-pay | Admitting: Gastroenterology

## 2018-11-14 ENCOUNTER — Emergency Department (HOSPITAL_COMMUNITY): Payer: Medicare Other

## 2018-11-14 ENCOUNTER — Other Ambulatory Visit: Payer: Self-pay

## 2018-11-14 ENCOUNTER — Inpatient Hospital Stay (HOSPITAL_COMMUNITY)
Admission: EM | Admit: 2018-11-14 | Discharge: 2018-11-20 | DRG: 871 | Disposition: A | Discharge status: Home / Self Care | Payer: Medicare Other | Attending: Family Medicine | Admitting: Family Medicine

## 2018-11-14 ENCOUNTER — Encounter (HOSPITAL_COMMUNITY): Payer: Self-pay

## 2018-11-14 DIAGNOSIS — D631 Anemia in chronic kidney disease: Secondary | ICD-10-CM | POA: Diagnosis present

## 2018-11-14 DIAGNOSIS — R188 Other ascites: Secondary | ICD-10-CM

## 2018-11-14 DIAGNOSIS — G9341 Metabolic encephalopathy: Secondary | ICD-10-CM | POA: Diagnosis present

## 2018-11-14 DIAGNOSIS — Z79891 Long term (current) use of opiate analgesic: Secondary | ICD-10-CM

## 2018-11-14 DIAGNOSIS — K651 Peritoneal abscess: Secondary | ICD-10-CM

## 2018-11-14 DIAGNOSIS — Z87442 Personal history of urinary calculi: Secondary | ICD-10-CM | POA: Diagnosis not present

## 2018-11-14 DIAGNOSIS — I272 Pulmonary hypertension, unspecified: Secondary | ICD-10-CM | POA: Diagnosis present

## 2018-11-14 DIAGNOSIS — Z7984 Long term (current) use of oral hypoglycemic drugs: Secondary | ICD-10-CM

## 2018-11-14 DIAGNOSIS — N186 End stage renal disease: Secondary | ICD-10-CM | POA: Diagnosis not present

## 2018-11-14 DIAGNOSIS — I083 Combined rheumatic disorders of mitral, aortic and tricuspid valves: Secondary | ICD-10-CM | POA: Diagnosis present

## 2018-11-14 DIAGNOSIS — R480 Dyslexia and alexia: Secondary | ICD-10-CM | POA: Diagnosis present

## 2018-11-14 DIAGNOSIS — Z885 Allergy status to narcotic agent status: Secondary | ICD-10-CM

## 2018-11-14 DIAGNOSIS — F0390 Unspecified dementia without behavioral disturbance: Secondary | ICD-10-CM | POA: Diagnosis present

## 2018-11-14 DIAGNOSIS — I5042 Chronic combined systolic (congestive) and diastolic (congestive) heart failure: Secondary | ICD-10-CM | POA: Diagnosis present

## 2018-11-14 DIAGNOSIS — Z6829 Body mass index (BMI) 29.0-29.9, adult: Secondary | ICD-10-CM

## 2018-11-14 DIAGNOSIS — Z87891 Personal history of nicotine dependence: Secondary | ICD-10-CM

## 2018-11-14 DIAGNOSIS — Z791 Long term (current) use of non-steroidal anti-inflammatories (NSAID): Secondary | ICD-10-CM

## 2018-11-14 DIAGNOSIS — A4181 Sepsis due to Enterococcus: Secondary | ICD-10-CM | POA: Diagnosis present

## 2018-11-14 DIAGNOSIS — E1142 Type 2 diabetes mellitus with diabetic polyneuropathy: Secondary | ICD-10-CM | POA: Diagnosis present

## 2018-11-14 DIAGNOSIS — A4159 Other Gram-negative sepsis: Secondary | ICD-10-CM | POA: Diagnosis present

## 2018-11-14 DIAGNOSIS — I132 Hypertensive heart and chronic kidney disease with heart failure and with stage 5 chronic kidney disease, or end stage renal disease: Secondary | ICD-10-CM | POA: Diagnosis present

## 2018-11-14 DIAGNOSIS — N2581 Secondary hyperparathyroidism of renal origin: Secondary | ICD-10-CM | POA: Diagnosis present

## 2018-11-14 DIAGNOSIS — A419 Sepsis, unspecified organism: Secondary | ICD-10-CM | POA: Diagnosis present

## 2018-11-14 DIAGNOSIS — K810 Acute cholecystitis: Secondary | ICD-10-CM | POA: Diagnosis not present

## 2018-11-14 DIAGNOSIS — R6520 Severe sepsis without septic shock: Secondary | ICD-10-CM | POA: Diagnosis not present

## 2018-11-14 DIAGNOSIS — R7989 Other specified abnormal findings of blood chemistry: Secondary | ICD-10-CM

## 2018-11-14 DIAGNOSIS — E669 Obesity, unspecified: Secondary | ICD-10-CM | POA: Diagnosis present

## 2018-11-14 DIAGNOSIS — K8309 Other cholangitis: Secondary | ICD-10-CM | POA: Diagnosis present

## 2018-11-14 DIAGNOSIS — K219 Gastro-esophageal reflux disease without esophagitis: Secondary | ICD-10-CM | POA: Diagnosis present

## 2018-11-14 DIAGNOSIS — E1122 Type 2 diabetes mellitus with diabetic chronic kidney disease: Secondary | ICD-10-CM | POA: Diagnosis present

## 2018-11-14 DIAGNOSIS — J9601 Acute respiratory failure with hypoxia: Secondary | ICD-10-CM | POA: Diagnosis not present

## 2018-11-14 DIAGNOSIS — K7200 Acute and subacute hepatic failure without coma: Secondary | ICD-10-CM

## 2018-11-14 DIAGNOSIS — Z992 Dependence on renal dialysis: Secondary | ICD-10-CM | POA: Diagnosis not present

## 2018-11-14 DIAGNOSIS — M1990 Unspecified osteoarthritis, unspecified site: Secondary | ICD-10-CM | POA: Diagnosis present

## 2018-11-14 DIAGNOSIS — A4151 Sepsis due to Escherichia coli [E. coli]: Secondary | ICD-10-CM | POA: Diagnosis present

## 2018-11-14 DIAGNOSIS — R7881 Bacteremia: Secondary | ICD-10-CM | POA: Diagnosis not present

## 2018-11-14 DIAGNOSIS — Z79899 Other long term (current) drug therapy: Secondary | ICD-10-CM

## 2018-11-14 DIAGNOSIS — I340 Nonrheumatic mitral (valve) insufficiency: Secondary | ICD-10-CM | POA: Diagnosis not present

## 2018-11-14 DIAGNOSIS — Z9049 Acquired absence of other specified parts of digestive tract: Secondary | ICD-10-CM | POA: Diagnosis not present

## 2018-11-14 DIAGNOSIS — Z20828 Contact with and (suspected) exposure to other viral communicable diseases: Secondary | ICD-10-CM | POA: Diagnosis present

## 2018-11-14 DIAGNOSIS — L299 Pruritus, unspecified: Secondary | ICD-10-CM | POA: Diagnosis not present

## 2018-11-14 LAB — COMPREHENSIVE METABOLIC PANEL
ALT: 187 U/L — ABNORMAL HIGH (ref 0–44)
AST: 169 U/L — ABNORMAL HIGH (ref 15–41)
Albumin: 3.1 g/dL — ABNORMAL LOW (ref 3.5–5.0)
Alkaline Phosphatase: 1114 U/L — ABNORMAL HIGH (ref 38–126)
Anion gap: 12 (ref 5–15)
BUN: 32 mg/dL — ABNORMAL HIGH (ref 6–20)
CO2: 26 mmol/L (ref 22–32)
Calcium: 8.7 mg/dL — ABNORMAL LOW (ref 8.9–10.3)
Chloride: 95 mmol/L — ABNORMAL LOW (ref 98–111)
Creatinine, Ser: 4.43 mg/dL — ABNORMAL HIGH (ref 0.61–1.24)
GFR calc Af Amer: 16 mL/min — ABNORMAL LOW (ref >60)
GFR calc non Af Amer: 14 mL/min — ABNORMAL LOW (ref >60)
Glucose, Bld: 323 mg/dL — ABNORMAL HIGH (ref 70–99)
Potassium: 3.9 mmol/L (ref 3.5–5.1)
Sodium: 133 mmol/L — ABNORMAL LOW (ref 135–145)
Total Bilirubin: 7 mg/dL — ABNORMAL HIGH (ref 0.3–1.2)
Total Protein: 6.6 g/dL (ref 6.5–8.1)

## 2018-11-14 LAB — CBC WITH DIFFERENTIAL/PLATELET
Abs Immature Granulocytes: 0.02 10*3/uL (ref 0.00–0.07)
Basophils Absolute: 0 10*3/uL (ref 0.0–0.1)
Basophils Relative: 0 %
Eosinophils Absolute: 0 10*3/uL (ref 0.0–0.5)
Eosinophils Relative: 0 %
HCT: 34.2 % — ABNORMAL LOW (ref 39.0–52.0)
Hemoglobin: 10.6 g/dL — ABNORMAL LOW (ref 13.0–17.0)
Immature Granulocytes: 0 %
Lymphocytes Relative: 2 %
Lymphs Abs: 0.1 10*3/uL — ABNORMAL LOW (ref 0.7–4.0)
MCH: 28.8 pg (ref 26.0–34.0)
MCHC: 31 g/dL (ref 30.0–36.0)
MCV: 92.9 fL (ref 80.0–100.0)
Monocytes Absolute: 0.1 10*3/uL (ref 0.1–1.0)
Monocytes Relative: 1 %
Neutro Abs: 7.4 10*3/uL (ref 1.7–7.7)
Neutrophils Relative %: 97 %
Platelets: 190 10*3/uL (ref 150–400)
RBC: 3.68 MIL/uL — ABNORMAL LOW (ref 4.22–5.81)
RDW: 15.1 % (ref 11.5–15.5)
WBC: 7.7 10*3/uL (ref 4.0–10.5)
nRBC: 0 % (ref 0.0–0.2)

## 2018-11-14 LAB — APTT: aPTT: 41 seconds — ABNORMAL HIGH (ref 24–36)

## 2018-11-14 LAB — LACTIC ACID, PLASMA
Lactic Acid, Venous: 2.1 mmol/L (ref 0.5–1.9)
Lactic Acid, Venous: 1.5 mmol/L (ref 0.5–1.9)

## 2018-11-14 LAB — PROTIME-INR
INR: 1 (ref 0.8–1.2)
Prothrombin Time: 12.7 seconds (ref 11.4–15.2)

## 2018-11-14 MED ORDER — ACETAMINOPHEN 650 MG RE SUPP: 650.0000 mg | Freq: Four times a day (QID) | RECTAL | Status: DC | PRN

## 2018-11-14 MED ORDER — SODIUM CHLORIDE 0.9 % IV SOLN: 2.0000 g | INTRAVENOUS | Status: DC

## 2018-11-14 MED ORDER — ACETAMINOPHEN 650 MG RE SUPP
650.0000 mg | Freq: Once | RECTAL | Status: AC
  Administered 2018-11-14: 19:00:00 650 mg via RECTAL
  Filled 2018-11-14: qty 1

## 2018-11-14 MED ORDER — SODIUM CHLORIDE 0.9 % IV SOLN
1.0000 g | INTRAVENOUS | Status: DC
  Administered 2018-11-14: 1 g via INTRAVENOUS
  Filled 2018-11-14 (×2): qty 1

## 2018-11-14 MED ORDER — SODIUM CHLORIDE 0.9 % IV BOLUS
1000.0000 mL | Freq: Once | INTRAVENOUS | Status: AC
  Administered 2018-11-14: 19:00:00 1000 mL via INTRAVENOUS

## 2018-11-14 MED ORDER — ONDANSETRON HCL 4 MG/2ML IJ SOLN
4.0000 mg | Freq: Four times a day (QID) | INTRAMUSCULAR | Status: DC | PRN
  Administered 2018-11-15: 19:00:00 4 mg via INTRAVENOUS
  Filled 2018-11-14: qty 2

## 2018-11-14 MED ORDER — SODIUM CHLORIDE 0.9 % IV SOLN: 2.0000 g | Freq: Once | INTRAVENOUS | Status: DC

## 2018-11-14 MED ORDER — INSULIN ASPART 100 UNIT/ML ~~LOC~~ SOLN
0.0000 [IU] | SUBCUTANEOUS | Status: DC
  Administered 2018-11-15: 09:00:00 3 [IU] via SUBCUTANEOUS
  Administered 2018-11-15: 23:00:00 2 [IU] via SUBCUTANEOUS
  Administered 2018-11-16: 12:00:00 1 [IU] via SUBCUTANEOUS

## 2018-11-14 MED ORDER — METRONIDAZOLE IN NACL 5-0.79 MG/ML-% IV SOLN
500.0000 mg | Freq: Once | INTRAVENOUS | Status: AC
  Administered 2018-11-14: 19:00:00 500 mg via INTRAVENOUS
  Filled 2018-11-14: qty 100

## 2018-11-14 MED ORDER — IOHEXOL 300 MG/ML  SOLN
100.0000 mL | Freq: Once | INTRAMUSCULAR | Status: AC | PRN
  Administered 2018-11-14: 20:00:00 100 mL via INTRAVENOUS

## 2018-11-14 MED ORDER — ACETAMINOPHEN 325 MG PO TABS
650.0000 mg | ORAL_TABLET | Freq: Four times a day (QID) | ORAL | Status: DC | PRN
  Administered 2018-11-14 – 2018-11-16 (×2): 650 mg via ORAL
  Filled 2018-11-14 (×2): qty 2

## 2018-11-14 MED ORDER — METRONIDAZOLE IN NACL 5-0.79 MG/ML-% IV SOLN
500.0000 mg | Freq: Three times a day (TID) | INTRAVENOUS | Status: DC
  Administered 2018-11-15: 500 mg via INTRAVENOUS
  Filled 2018-11-14 (×2): qty 100

## 2018-11-14 NOTE — H&P (Signed)
History and Physical    Edgar Brewer SEG:[id number] DOB: 01-26-1960 DOA: 11/14/2018  PCP: Edgar Moment, Brewer Patient coming from: Home  Chief Complaint: Fever  HPI: Edgar Brewer is a 58 y.o. male with medical history significant of ESRD on HD MWF, chronic diastolic congestive heart failure, arthritis, type 2 diabetes, GERD, hypertension presenting to the ED for evaluation of fever, abdominal pain, vomiting, and generalized weakness.  Of note, patient was admitted 9/24-9/28 for nausea, vomiting, and elevated LFTs.  Imaging revealed intra-abdominal fluid collection, seroma versus biloma versus abscess in the setting of recent laparoscopic cholecystectomy 9/10 with initial concern for bile leak.  GI and general surgery were consulted.  Patient was treated with empiric antibiotics.  HIDA scan was negative for bile leak.  Patient is currently somnolent and very lethargic.  Difficult to obtain history from him.  States he has been vomiting all day and having fevers.  Denies abdominal pain.  He is not sure when his last dialysis session was.  No additional history can be obtained from him.  No family available at this time.  ED Course: Febrile with temperature 103.1 F.  Tachycardic and tachypneic.  Not hypotensive.  Oxygen saturation 88% on room air, improved to 97% with 2 L supplemental oxygen.  No leukocytosis.  Lactic acid 2.1 >1.5 with IV fluid.  AST 169, ALT 187, alk phos 1114, and T bili 7.0.  LFTs now worse since recent hospitalization.  INR 1.0.  Blood culture x2 pending.  UA and urine culture pending.  Ammonia level pending.  SARS-CoV-2 test pending.  Chest x-ray showing pulmonary vascular congestion and patchy airspace opacity with blunting at the left costophrenic angle which could represent small effusion, atelectasis, and/or early infectious etiology.  CT abdomen pelvis showing interval slight increase in the fluid collection within the gallbladder bed now with a small foci of air, measuring 4.1 x  2.8 x 2.7 cm concerning for possible abscess.  CT also showing slight interval increase in patchy/streaky airspace opacities at both lung bases which could represent atelectasis and/or atypical infectious etiology.  General surgery consulted and will see the patient in the morning.  Recommending IR consultation to aspirate or place a drain in the fluid collection for sample/culture.  ED provider discussed the case with Dr. Hilarie Brewer, GI will consult in a.m. Patient received Tylenol, cefepime, metronidazole, and 1 L normal saline bolus.  Review of Systems:  All systems reviewed and apart from history of presenting illness, are negative.  Past Medical History:  Diagnosis Date   Acute on chronic combined systolic and diastolic CHF, NYHA class 1 (Edgar Brewer) 01/13/2015   Arthritis    Chronic kidney disease    MWF Edgar Brewer.   Diabetes mellitus type 2 with complications (Edgar Brewer)    Previous insulin, non-insulin requiring noted 09/2018.  Complications include retinopathy, peripheral neuropathy.   GERD (gastroesophageal reflux disease)    at times   Head injury with loss of consciousness (Edgar Brewer)    History of kidney stones    Hypertension    Neuropathy    Pulmonary hypertension (Edgar Brewer)    PA peak pressure 41 mmHg 03/04/16 echo   Varicose veins of lower extremities with complications 73/71/0626    Past Surgical History:  Procedure Laterality Date   AV FISTULA PLACEMENT Left 06/01/2016   Procedure: ARTERIOVENOUS (AV) FISTULA CREATION;  Surgeon: Edgar Posner, Brewer;  Location: [facility];  Service: Vascular;  Laterality: Left;   AV FISTULA PLACEMENT Left 08/10/2017   Procedure: BRACHIOCEPHALIC ARTERIOVENOUS (AV)  FISTULA CREATION LEFT UPPER EXTREMITY;  Surgeon: Edgar Mould, Brewer;  Location: [place] [facility];  Service: Vascular;  Laterality: Left;   CHOLECYSTECTOMY N/A 10/04/2018   Procedure: LAPAROSCOPIC CHOLECYSTECTOMY;  Surgeon: Edgar Brightly Arta Bruce, Brewer;  Location: [facility];  Service: General;  Laterality: N/A;    COLONOSCOPY W/ POLYPECTOMY  03/2016   For evaluation of iron deficiency anemia.  Edgar Brewer.  10 polyps, largest 20 mm.  Pathology: tubular adenomas with no high-grade dysplasia, hyperplastic.  None bleeding internal hemorrhoids.  Pandiverticulosis.   ENDOVENOUS ABLATION SAPHENOUS VEIN W/ LASER Left 07-23-2015   endovenous laser ablation left greater saphenous vein by Edgar Brewer   ENDOVENOUS ABLATION SAPHENOUS VEIN W/ LASER Right 10/15/2015   EVLA right greater saphenous vein by Edgar Brewer   ESOPHAGOGASTRODUODENOSCOPY  03/2016   For evaluation of IDA.  Edgar Brewer.  Duodenal erythema, pathology benign mucosa, no villous atrophy.     reports that he quit smoking about 3 years ago. His smoking use included cigarettes. He has a 45.00 pack-year smoking history. He has never used smokeless tobacco. He reports current alcohol use of about 1.0 standard drinks of alcohol per week. He reports that he does not use drugs.  Allergies  Allergen Reactions   Codeine Anaphylaxis, Hives, Swelling and Other (See Comments)    Swelling all over body and the throat    Family History  Problem Relation Age of Onset   Cancer Father     Prior to Admission medications   Medication Sig Start Date End Date Taking? Authorizing Provider  b complex-vitamin c-folic acid (NEPHRO-VITE) 0.8 MG TABS tablet Take 1 tablet by mouth daily.   Yes Provider, Historical, Brewer  cinacalcet (SENSIPAR) 60 MG tablet Take 60 mg by mouth daily.   Yes Provider, Historical, Brewer  doxercalciferol (HECTOROL) 4 MCG/2ML injection Inject 5 mLs (10 mcg total) into the vein every Monday, Wednesday, and Friday with hemodialysis. 10/08/18  Yes Swayze, Ava, DO  gabapentin (NEURONTIN) 300 MG capsule Take 300 mg by mouth See admin instructions. Take 300 mg by mouth in the morning and 300 mg at bedtime   Yes Provider, Historical, Brewer  ibuprofen (ADVIL) 200 MG tablet Take 200-800 mg by mouth every 8 (eight) hours as needed for headache or mild  pain.   Yes Provider, Historical, Brewer  linagliptin (TRADJENTA) 5 MG TABS tablet Take 5 mg by mouth daily.   Yes Provider, Historical, Brewer  omeprazole (PRILOSEC) 20 MG capsule Take 20 mg by mouth daily before breakfast.  11/25/17  Yes Provider, Historical, Brewer  ondansetron (ZOFRAN) 4 MG tablet Take 1 tablet (4 mg total) by mouth every 6 (six) hours as needed for nausea. Patient taking differently: Take 4 mg by mouth every 6 (six) hours as needed for nausea or vomiting.  10/22/18  Yes Aline August, Brewer  sevelamer carbonate (RENVELA) 800 MG tablet Take 2 tablets (1,600 mg total) by mouth 3 (three) times daily with meals. 10/22/18  Yes Aline August, Brewer  cinacalcet (SENSIPAR) 30 MG tablet Take 2 tablets (60 mg total) by mouth daily with supper. Patient not taking: Reported on 11/14/2018 10/06/18   Swayze, Ava, DO  traMADol (ULTRAM) 50 MG tablet Take 1 tablet (50 mg total) by mouth every 12 (twelve) hours as needed for moderate pain. Patient not taking: Reported on 11/14/2018 10/22/18   Aline August, Brewer    Physical Exam: Vitals:   11/15/18 0230 11/15/18 0245 11/15/18 0300 11/15/18 0323  BP: (!) 117/59 120/63 116/62   Pulse:  97 98 98   Resp: (!) 30 (!) 30 (!) 31   Temp:    99.4 F (37.4 C)  TempSrc:    Oral  SpO2: 96% 96% 95%     Physical Exam  Constitutional:  Very lethargic  HENT:  Head: Normocephalic.  Eyes: Right eye exhibits no discharge. Left eye exhibits no discharge.  Neck: Neck supple.  Cardiovascular: Normal rate, regular rhythm and intact distal pulses.  Pulmonary/Chest: He has no wheezes.  Equal air entry bilaterally upon auscultation of anterior lung fields.  Patient is very lethargic and not able to sit up in the bed.  On 2 L supplemental oxygen.  Abdominal: Soft. Bowel sounds are normal. There is no abdominal tenderness. There is no rebound and no guarding.  Musculoskeletal:        General: No edema.  Neurological:  Somnolent  Skin: Skin is warm and dry.  Jaundiced      Labs on Admission: I have personally reviewed following labs and imaging studies  CBC: Recent Labs  Lab 11/08/18 1216 11/14/18 1702  WBC 4.7 7.7  NEUTROABS 3.6 7.4  HGB 10.4* 10.6*  HCT 30.8* 34.2*  MCV 88.0 92.9  PLT 240.0 073   Basic Metabolic Panel: Recent Labs  Lab 11/08/18 1216 11/14/18 1702  NA 136 133*  K 3.5 3.9  CL 93* 95*  CO2 34* 26  GLUCOSE 247* 323*  BUN 18 32*  CREATININE 3.09* 4.43*  CALCIUM 8.7 8.7*   GFR: Estimated Creatinine Clearance: 19.3 mL/min (A) (by C-G formula based on SCr of 4.43 mg/dL (H)). Liver Function Tests: Recent Labs  Lab 11/08/18 1216 11/14/18 1702  AST 47* 169*  ALT 102* 187*  ALKPHOS 1,087* 1,114*  BILITOT 5.1* 7.0*  PROT 6.6 6.6  ALBUMIN 3.5 3.1*   No results for input(s): LIPASE, AMYLASE in the last 168 hours. Recent Labs  Lab 11/15/18 0242  AMMONIA 38*   Coagulation Profile: Recent Labs  Lab 11/14/18 1702  INR 1.0   Cardiac Enzymes: No results for input(s): CKTOTAL, CKMB, CKMBINDEX, TROPONINI in the last 168 hours. BNP (last 3 results) No results for input(s): PROBNP in the last 8760 hours. HbA1C: Recent Labs    11/15/18 0242  HGBA1C 7.3*   CBG: Recent Labs  Lab 11/15/18 0111  GLUCAP 243*   Lipid Profile: No results for input(s): CHOL, HDL, LDLCALC, TRIG, CHOLHDL, LDLDIRECT in the last 72 hours. Thyroid Function Tests: No results for input(s): TSH, T4TOTAL, FREET4, T3FREE, THYROIDAB in the last 72 hours. Anemia Panel: No results for input(s): VITAMINB12, FOLATE, FERRITIN, TIBC, IRON, RETICCTPCT in the last 72 hours. Urine analysis:    Component Value Date/Time   COLORURINE AMBER (A) 10/19/2018 0501   APPEARANCEUR CLEAR 10/19/2018 0501   LABSPEC 1.028 10/19/2018 0501   PHURINE 6.0 10/19/2018 0501   GLUCOSEU >=500 (A) 10/19/2018 0501   HGBUR SMALL (A) 10/19/2018 0501   BILIRUBINUR NEGATIVE 10/19/2018 0501   KETONESUR NEGATIVE 10/19/2018 0501   PROTEINUR >=300 (A) 10/19/2018 0501    NITRITE NEGATIVE 10/19/2018 0501   LEUKOCYTESUR NEGATIVE 10/19/2018 0501    Radiological Exams on Admission: Ct Chest Wo Contrast  Result Date: 11/15/2018 CLINICAL DATA:  Shortness of breath, fever EXAM: CT CHEST WITHOUT CONTRAST TECHNIQUE: Multidetector CT imaging of the chest was performed following the standard protocol without IV contrast. COMPARISON:  None. FINDINGS: Cardiovascular: There are no significant vascular findings. There is a minimal pericardial effusion seen. Coronary artery calcifications are noted. Scattered aortic atherosclerotic calcifications are seen without  aneurysmal dilatation. Mediastinum/Nodes: There are no enlarged mediastinal, hilar or axillary lymph nodes. The thyroid gland, trachea and esophagus demonstrate no significant findings. Lungs/Pleura: Patchy/streaky area of airspace opacities are seen at the posterior bilateral lower lungs. There are small amount of air bronchogram seen within this region, left greater than right. No pneumothorax or pleural effusion. Upper abdomen: Partially visualized low-density collection with foci of air within the gallbladder bed. A small hiatal hernia is present. Musculoskeletal/Chest wall: There is no chest wall mass or suspicious osseous finding. No acute osseous abnormality IMPRESSION: 1. Mild patchy airspace disease in the posterior lower lungs, left greater than right. This likely represents atelectasis, less likely atypical infectious etiology. 2. Trace pericardial effusion 3.  Aortic Atherosclerosis (ICD10-I70.0). Electronically Signed   By: Prudencio Pair M.D.   On: 11/15/2018 01:07   Ct Abdomen Pelvis W Contrast  Addendum Date: 11/14/2018   ADDENDUM REPORT: 11/14/2018 23:28 ADDENDUM: The mild biliary ductal dilatation reported in the findings, appears to be new since the prior exam. There is also a new mild common bile duct dilatation measuring up to 0.2 cm with mild surrounding fat stranding changes. This could be due to  choledocholithiasis. Electronically Signed   By: Prudencio Pair M.D.   On: 11/14/2018 23:28   Result Date: 11/14/2018 CLINICAL DATA:  Nausea vomiting abdominal pain, fever EXAM: CT ABDOMEN AND PELVIS WITH CONTRAST TECHNIQUE: Multidetector CT imaging of the abdomen and pelvis was performed using the standard protocol following bolus administration of intravenous contrast. CONTRAST:  112m OMNIPAQUE IOHEXOL 300 MG/ML  SOLN COMPARISON:  October 18, 2018 FINDINGS: Lower chest: The visualized heart size within normal limits. No pericardial fluid/thickening. No hiatal hernia. There is patchy/streaky airspace opacity at both lung bases, slightly increased from the prior exam. Hepatobiliary: Within the gallbladder bed there is a 4.1 x 2.8 x 2.7 cm low-density collection now slightly increased in size from the prior exam with a small foci of air. There is mild intrahepatic biliary ductal dilatation. There does appear to be some fat stranding changes seen within the gallbladder bed. No other focal lesions seen.The main portal vein is patent. The patient is status post cholecystectomy. Pancreas: Unremarkable. No pancreatic ductal dilatation or surrounding inflammatory changes. Spleen: Normal in size without focal abnormality. Adrenals/Urinary Tract: Both adrenal glands appear normal. The kidneys and collecting system appear normal without evidence of urinary tract calculus or hydronephrosis. Bladder is unremarkable. Again noted are bilateral perinephric stranding. Stomach/Bowel: The stomach, small bowel, and colon are normal in appearance. No inflammatory changes, wall thickening, or obstructive findings.The appendix is normal. Vascular/Lymphatic: There are no enlarged mesenteric, retroperitoneal, or pelvic lymph nodes. Scattered aortic atherosclerotic calcifications are seen without aneurysmal dilatation. Reproductive: The prostate is unremarkable. Other: No evidence of abdominal wall mass or hernia. Musculoskeletal: No  acute or significant osseous findings. IMPRESSION: 1. Interval slight increase in the fluid collection within the gallbladder bed now with a small foci of air, measuring 4.1 x 2.8 x 2.7 cm. Given the patient's history this could represent a infected collection within the liver/gallbladder bed. 2. Slight interval increase in the patchy/streaky airspace opacities at both lung bases which could represent atelectasis and/or atypical infectious etiology. Electronically Signed: By: BPrudencio PairM.D. On: 11/14/2018 20:10   Dg Chest Port 1 View  Result Date: 11/14/2018 CLINICAL DATA:  Sepsis EXAM: PORTABLE CHEST 1 VIEW COMPARISON:  July 05, 2016 FINDINGS: The heart size and mediastinal contours is unchanged. There is patchy airspace opacity/blunting seen at the left lung base.  Pulmonary vascular congestion is seen. The visualized skeletal structures are unremarkable. IMPRESSION: Pulmonary vascular congestion. Patchy airspace opacity with blunting seen at the left costophrenic angle which could represent small effusion, atelectasis, and/or early infectious etiology. Electronically Signed   By: Prudencio Pair M.D.   On: 11/14/2018 19:05    EKG: Independently reviewed.  Sinus tachycardia, nonspecific T wave abnormality.  Assessment/Plan Principal Problem:   Intra-abdominal abscess (HCC) Active Problems:   ESRD (end stage renal disease) on dialysis (HCC)   Elevated LFTs   Sepsis ([place])   Acute respiratory failure with hypoxia (HCC)  Sepsis secondary to intra-abdominal abscess Febrile, tachycardic, and tachypneic.  No leukocytosis.  Had mild lactic acidosis which resolved after IV fluid resuscitation. CT abdomen pelvis showing interval slight increase in the fluid collection within the gallbladder bed now with a small foci of air, measuring 4.1 x 2.8 x 2.7 cm concerning for possible abscess. -General surgery consulted, will see the patient in the morning.  Recommended IR consultation for abscess drainage. -ED  provider discussed the case with interventional radiology, did not feel the abscess was causing the patient's sepsis.  Recommended further imaging with MRCP to evaluate for bile duct stone. -GI consulted, will see the patient in the morning. -Cefepime and metronidazole -Tylenol as needed -Blood culture x2 pending  Jaundice, vomiting, elevated LFTs AST 169, ALT 187, alk phos 1114, and T bili 7.0.  LFTs now worse since recent hospitalization.  Status post lap chole on 9/10.  HIDA scan done 9/25 without evidence of bile leak. -MRCP to evaluate for bile duct stone -Bowel rest, keep n.p.o. -IV Zofran as needed  Acute hypoxic respiratory failure Tachypneic.  Oxygen saturation 88% on room air, currently requiring 2 L supplemental oxygen.  Chest CT showing mild patchy airspace disease in the posterior lower lungs, left greater than right.  Findings thought to likely represent atelectasis, less likely atypical infectious etiology. -SARS-CoV-2 test pending -Continuous pulse ox -Supplemental oxygen -Incentive spirometry  ESRD on HD MWF Chest CT without pulmonary vascular congestion.  No significant electrolyte derangements. -Consult nephrology in a.m.  Non-insulin-dependent diabetes -Sliding scale insulin sensitive every 4 hours as patient is currently n.p.o.  DVT prophylaxis: SCDs at this time Code Status: Full code Family Communication: No family available. Disposition Plan: Anticipate discharge after clinical improvement. Consults called: General surgery (Dr. Georgette Dover), IR (Dr. Anselm Pancoast) Admission status: It is my clinical opinion that admission to INPATIENT is reasonable and necessary in this 58 y.o. male  presenting with jaundice, vomiting, elevated LFTs, and sepsis secondary to intra-abdominal abscess.  High risk of decompensation.  Given the aforementioned, the predictability of an adverse outcome is felt to be significant. I expect that the patient will require at least 2 midnights in the  hospital to treat this condition.   The medical decision making on this patient was of high complexity and the patient is at high risk for clinical deterioration, therefore this is a level 3 visit.  Shela Leff Brewer [facility] Pager [phone]  If 7PM-7AM, please contact night-coverage [email or web address] Password [email or web address]  11/15/2018, 4:01 AM

## 2018-11-14 NOTE — Progress Notes (Signed)
Patient ID: Edgar Brewer, male   DOB: [birth year], 59 y.o.   MRN: [id number]  The general surgery service will follow-up with the patient tomorrow.  HIDA scan last time showed no sign of leak. Normal WBC. Fluid collection in GB fossa shows a possible dot of air - possible abscess. Consider asking IR to aspirate or place a drain in the fluid collection for sample/ culture  Will see the patient in AM.  Imogene Burn. Georgette Dover, MD, [facility] Surgery  General/ Trauma Surgery   11/14/2018 9:56 PM

## 2018-11-14 NOTE — ED Provider Notes (Signed)
[facility] DEPART[facility] Provider Note   CSN: [id number] Arrival date & time: 11/14/18  1621     History   Chief Complaint Chief Complaint  Patient presents with   Fever   Altered Mental Status   Emesis    HPI Level 5 caveat secondary to altered mental status Edgar Brewer is a 58 y.o. male with history of hypertension, ESRD on dialysis, diabetes, CHF previous intra-abdominal fluid collection probably cholecystectomy and choledocholithiasis presents with fever, abdominal pain, and vomiting that began this morning.  Patient was in his usual state of health yesterday.  Patient has no known sick contacts.  Patient is unable to contribute to history.  His mother is at bedside.     HPI  Past Medical History:  Diagnosis Date   Acute on chronic combined systolic and diastolic CHF, NYHA class 1 ([place]) 01/13/2015   Arthritis    Chronic kidney disease    MWF [name].   Diabetes mellitus type 2 with complications ([place])    Previous insulin, non-insulin requiring noted 09/2018.  Complications include retinopathy, peripheral neuropathy.   GERD (gastroesophageal reflux disease)    at times   Head injury with loss of consciousness ([place])    History of kidney stones    Hypertension    Neuropathy    Pulmonary hypertension ([place])    PA peak pressure 41 mmHg 03/04/16 echo   Varicose veins of lower extremities with complications 48/18/5631    Patient Active Problem List   Diagnosis Date Noted   Gallbladder abscess 11/14/2018   Intractable nausea and vomiting 10/19/2018   Abnormal LFTs 10/18/2018   Intraabdominal fluid collection 10/18/2018   Nausea & vomiting 10/18/2018   Choledocholithiasis 10/03/2018   ESRD (end stage renal disease) on dialysis ([place]) 10/03/2018   AKI (acute kidney injury) ([place]) 07/06/2016   Chest pain at rest 07/06/2016   Chest pain 07/06/2016   CKD (chronic kidney disease), stage IV ([place]) 01/14/2015   Diabetes  mellitus type II, non insulin dependent ([place]) 01/14/2015   Obesity 01/14/2015   Accelerated hypertension 01/14/2015   Hypokalemia 01/14/2015   Anemia of chronic kidney failure 01/14/2015   Chronic diastolic CHF (congestive heart failure) ([place]) 01/13/2015   Hematuria 01/13/2015   Varicose veins of lower extremities with complications 49/70/2637   Chronic venous insufficiency 10/22/2014    Past Surgical History:  Procedure Laterality Date   AV FISTULA PLACEMENT Left 06/01/2016   Procedure: ARTERIOVENOUS (AV) FISTULA CREATION;  Surgeon: [name], MD;  Location: [facility];  Service: Vascular;  Laterality: Left;   AV FISTULA PLACEMENT Left 08/10/2017   Procedure: BRACHIOCEPHALIC ARTERIOVENOUS (AV) FISTULA CREATION LEFT UPPER EXTREMITY;  Surgeon: [name], MD;  Location: [facility];  Service: Vascular;  Laterality: Left;   CHOLECYSTECTOMY N/A 10/04/2018   Procedure: LAPAROSCOPIC CHOLECYSTECTOMY;  Surgeon: Mickeal Skinner, MD;  Location: [facility];  Service: General;  Laterality: N/A;   COLONOSCOPY W/ POLYPECTOMY  03/2016   For evaluation of iron deficiency anemia.  Dr. Silverio Decamp.  10 polyps, largest 20 mm.  Pathology: tubular adenomas with no high-grade dysplasia, hyperplastic.  None bleeding internal hemorrhoids.  Pandiverticulosis.   ENDOVENOUS ABLATION SAPHENOUS VEIN W/ LASER Left 07-23-2015   endovenous laser ablation left greater saphenous vein by [name]   ENDOVENOUS ABLATION SAPHENOUS VEIN W/ LASER Right 10/15/2015   EVLA right greater saphenous vein by [name]   ESOPHAGOGASTRODUODENOSCOPY  03/2016   For evaluation of IDA.  Dr. Silverio Decamp.  Duodenal erythema,  pathology benign mucosa, no villous atrophy.        Home Medications    Prior to Admission medications   Medication Sig Start Date End Date Taking? Authorizing Provider  b complex-vitamin c-folic acid (NEPHRO-VITE) 0.8 MG TABS tablet Take 1 tablet by mouth daily.   Yes [provider]   cinacalcet (SENSIPAR) 60 MG tablet Take 60 mg by mouth daily.   Yes [provider]  doxercalciferol (HECTOROL) 4 MCG/2ML injection Inject 5 mLs (10 mcg total) into the vein every Monday, Wednesday, and Friday with hemodialysis. 10/08/18  Yes Swayze, Ava, DO  gabapentin (NEURONTIN) 300 MG capsule Take 300 mg by mouth See admin instructions. Take 300 mg by mouth in the morning and 300 mg at bedtime   Yes [provider]  ibuprofen (ADVIL) 200 MG tablet Take 200-800 mg by mouth every 8 (eight) hours as needed for headache or mild pain.   Yes [provider]  linagliptin (TRADJENTA) 5 MG TABS tablet Take 5 mg by mouth daily.   Yes [provider]  omeprazole (PRILOSEC) 20 MG capsule Take 20 mg by mouth daily before breakfast.  11/25/17  Yes [provider]  ondansetron (ZOFRAN) 4 MG tablet Take 1 tablet (4 mg total) by mouth every 6 (six) hours as needed for nausea. Patient taking differently: Take 4 mg by mouth every 6 (six) hours as needed for nausea or vomiting.  10/22/18  Yes Aline August, MD  sevelamer carbonate (RENVELA) 800 MG tablet Take 2 tablets (1,600 mg total) by mouth 3 (three) times daily with meals. 10/22/18  Yes Aline August, MD  cinacalcet (SENSIPAR) 30 MG tablet Take 2 tablets (60 mg total) by mouth daily with supper. Patient not taking: Reported on 11/14/2018 10/06/18   Swayze, Ava, DO  traMADol (ULTRAM) 50 MG tablet Take 1 tablet (50 mg total) by mouth every 12 (twelve) hours as needed for moderate pain. Patient not taking: Reported on 11/14/2018 10/22/18   Aline August, MD    Family History Family History  Problem Relation Age of Onset   Cancer Father     Social History Social History   Tobacco Use   Smoking status: Former Smoker    Packs/day: 1.00    Years: 45.00    Pack years: 45.00    Types: Cigarettes    Quit date: 01/13/2015    Years since quitting: 3.8   Smokeless tobacco: Never Used  Substance Use Topics    Alcohol use: Yes    Alcohol/week: 1.0 standard drinks    Types: 1 Glasses of wine per week    Comment: rare occassion   Drug use: No     Allergies   Codeine   Review of Systems Review of Systems  Constitutional: Positive for fever. Negative for chills.  HENT: Negative for facial swelling and sore throat.   Respiratory: Negative for shortness of breath.   Cardiovascular: Negative for chest pain.  Gastrointestinal: Positive for abdominal pain, nausea and vomiting.  Genitourinary: Negative for dysuria.  Musculoskeletal: Negative for back pain.  Skin: Negative for rash and wound.  Neurological: Negative for headaches.  Psychiatric/Behavioral: The patient is not nervous/anxious.      Physical Exam Updated Vital Signs BP 133/62    Pulse (!) 103    Temp (!) 102.5 F (39.2 C) (Oral)    Resp (!) 29    SpO2 97%   Physical Exam Vitals signs and nursing note reviewed.  Constitutional:      General: He is  not in acute distress.    Appearance: He is well-developed. He is not diaphoretic.     Comments: Altered, sleeping  HENT:     Head: Normocephalic and atraumatic.     Mouth/Throat:     Pharynx: No oropharyngeal exudate.  Eyes:     General: No scleral icterus.       Right eye: No discharge.        Left eye: No discharge.     Conjunctiva/sclera: Conjunctivae normal.     Pupils: Pupils are equal, round, and reactive to light.  Neck:     Musculoskeletal: Normal range of motion and neck supple.     Thyroid: No thyromegaly.  Cardiovascular:     Rate and Rhythm: Normal rate and regular rhythm.     Heart sounds: Normal heart sounds. No murmur. No friction rub. No gallop.   Pulmonary:     Effort: Pulmonary effort is normal. No respiratory distress.     Breath sounds: Normal breath sounds. No stridor. No wheezing or rales.  Abdominal:     General: Bowel sounds are normal. There is no distension.     Palpations: Abdomen is soft.     Tenderness: There is no abdominal tenderness.  There is no guarding or rebound.  Lymphadenopathy:     Cervical: No cervical adenopathy.  Skin:    General: Skin is warm and dry.     Coloration: Skin is jaundiced. Skin is not pale.     Findings: No rash.  Neurological:     Mental Status: He is alert.     Coordination: Coordination normal.      ED Treatments / Results  Labs (all labs ordered are listed, but only abnormal results are displayed) Labs Reviewed  COMPREHENSIVE METABOLIC PANEL - Abnormal; Notable for the following components:      Result Value   Sodium 133 (*)    Chloride 95 (*)    Glucose, Bld 323 (*)    BUN 32 (*)    Creatinine, Ser 4.43 (*)    Calcium 8.7 (*)    Albumin 3.1 (*)    AST 169 (*)    ALT 187 (*)    Alkaline Phosphatase 1,114 (*)    Total Bilirubin 7.0 (*)    GFR calc non Af Amer 14 (*)    GFR calc Af Amer 16 (*)    All other components within normal limits  LACTIC ACID, PLASMA - Abnormal; Notable for the following components:   Lactic Acid, Venous 2.1 (*)    All other components within normal limits  CBC WITH DIFFERENTIAL/PLATELET - Abnormal; Notable for the following components:   RBC 3.68 (*)    Hemoglobin 10.6 (*)    HCT 34.2 (*)    Lymphs Abs 0.1 (*)    All other components within normal limits  APTT - Abnormal; Notable for the following components:   aPTT 41 (*)    All other components within normal limits  CULTURE, BLOOD (ROUTINE X 2)  CULTURE, BLOOD (ROUTINE X 2)  URINE CULTURE  SARS CORONAVIRUS 2 (TAT 6-24 HRS)  LACTIC ACID, PLASMA  PROTIME-INR  URINALYSIS, ROUTINE W REFLEX MICROSCOPIC  AMMONIA  HEMOGLOBIN A1C    EKG None  Radiology Ct Abdomen Pelvis W Contrast  Addendum Date: 11/14/2018   ADDENDUM REPORT: 11/14/2018 23:28 ADDENDUM: The mild biliary ductal dilatation reported in the findings, appears to be new since the prior exam. There is also a new mild common bile duct dilatation measuring up to  0.2 cm with mild surrounding fat stranding changes. This could be due  to choledocholithiasis. Electronically Signed   By: Prudencio Pair M.D.   On: 11/14/2018 23:28   Result Date: 11/14/2018 CLINICAL DATA:  Nausea vomiting abdominal pain, fever EXAM: CT ABDOMEN AND PELVIS WITH CONTRAST TECHNIQUE: Multidetector CT imaging of the abdomen and pelvis was performed using the standard protocol following bolus administration of intravenous contrast. CONTRAST:  127m OMNIPAQUE IOHEXOL 300 MG/ML  SOLN COMPARISON:  October 18, 2018 FINDINGS: Lower chest: The visualized heart size within normal limits. No pericardial fluid/thickening. No hiatal hernia. There is patchy/streaky airspace opacity at both lung bases, slightly increased from the prior exam. Hepatobiliary: Within the gallbladder bed there is a 4.1 x 2.8 x 2.7 cm low-density collection now slightly increased in size from the prior exam with a small foci of air. There is mild intrahepatic biliary ductal dilatation. There does appear to be some fat stranding changes seen within the gallbladder bed. No other focal lesions seen.The main portal vein is patent. The patient is status post cholecystectomy. Pancreas: Unremarkable. No pancreatic ductal dilatation or surrounding inflammatory changes. Spleen: Normal in size without focal abnormality. Adrenals/Urinary Tract: Both adrenal glands appear normal. The kidneys and collecting system appear normal without evidence of urinary tract calculus or hydronephrosis. Bladder is unremarkable. Again noted are bilateral perinephric stranding. Stomach/Bowel: The stomach, small bowel, and colon are normal in appearance. No inflammatory changes, wall thickening, or obstructive findings.The appendix is normal. Vascular/Lymphatic: There are no enlarged mesenteric, retroperitoneal, or pelvic lymph nodes. Scattered aortic atherosclerotic calcifications are seen without aneurysmal dilatation. Reproductive: The prostate is unremarkable. Other: No evidence of abdominal wall mass or hernia. Musculoskeletal: No  acute or significant osseous findings. IMPRESSION: 1. Interval slight increase in the fluid collection within the gallbladder bed now with a small foci of air, measuring 4.1 x 2.8 x 2.7 cm. Given the patient's history this could represent a infected collection within the liver/gallbladder bed. 2. Slight interval increase in the patchy/streaky airspace opacities at both lung bases which could represent atelectasis and/or atypical infectious etiology. Electronically Signed: By: BPrudencio PairM.D. On: 11/14/2018 20:10   Dg Chest Port 1 View  Result Date: 11/14/2018 CLINICAL DATA:  Sepsis EXAM: PORTABLE CHEST 1 VIEW COMPARISON:  July 05, 2016 FINDINGS: The heart size and mediastinal contours is unchanged. There is patchy airspace opacity/blunting seen at the left lung base. Pulmonary vascular congestion is seen. The visualized skeletal structures are unremarkable. IMPRESSION: Pulmonary vascular congestion. Patchy airspace opacity with blunting seen at the left costophrenic angle which could represent small effusion, atelectasis, and/or early infectious etiology. Electronically Signed   By: BPrudencio PairM.D.   On: 11/14/2018 19:05    Procedures Procedures (including critical care time)  Medications Ordered in ED Medications  ceFEPIme (MAXIPIME) 1 g in sodium chloride 0.9 % 100 mL IVPB (0 g Intravenous Stopped 11/14/18 2229)  metroNIDAZOLE (FLAGYL) IVPB 500 mg (has no administration in time range)  insulin aspart (novoLOG) injection 0-9 Units (has no administration in time range)  acetaminophen (TYLENOL) tablet 650 mg (650 mg Oral Given 11/14/18 2340)    Or  acetaminophen (TYLENOL) suppository 650 mg ( Rectal See Alternative 11/14/18 2340)  ondansetron (ZOFRAN) injection 4 mg (has no administration in time range)  acetaminophen (TYLENOL) suppository 650 mg (650 mg Rectal Given 11/14/18 1853)  metroNIDAZOLE (FLAGYL) IVPB 500 mg (0 mg Intravenous Stopped 11/14/18 2040)  sodium chloride 0.9 % bolus 1,000  mL (0 mLs Intravenous Stopped 11/14/18 2041)  iohexol (OMNIPAQUE) 300 MG/ML solution 100 mL (100 mLs Intravenous Contrast Given 11/14/18 1939)     Initial Impression / Assessment and Plan / ED Course  I have reviewed the triage vital signs and the nursing notes.  Pertinent labs & imaging results that were available during my care of the patient were reviewed by me and considered in my medical decision making (see chart for details).        Patient presenting with sepsis with abdominal suspected etiology.  Patient has elevated LFTs, alk phos, and bilirubin up to 7 today.  He is jaundiced.  He is febrile, tachycardic, and tachypneic.  Lactic acid 2.1.  CT shows some fluid in the gallbladder fossa, however after discussing with Dr. Anselm Pancoast with IR, he does not feel this is patient's cause of sepsis, although he has offered to aspirate if this is requested.  He feels that patient may need further imaging, such as MRCP, to evaluate for bile duct stone.  Prior to this, I spoke with GI, Dr. Hilarie Fredrickson, who evaluate the patient tomorrow.  General surgeon, Dr. Georgette Dover, also made aware of the patient.  I discussed patient case with Dr. Marlowe Sax with [facility] who accepts patient for admission.  I appreciate the above consultants for their assistance with the patient.  Final Clinical Impressions(s) / ED Diagnoses   Final diagnoses:  Sepsis with acute liver failure without hepatic coma or septic shock, due to unspecified organism [facility])    ED Discharge Orders    None       Frederica Kuster, PA-C 11/15/18 0026    Varney Biles, MD 11/16/18 [phone]

## 2018-11-14 NOTE — Telephone Encounter (Signed)
Mother will drive patient to the ED now.

## 2018-11-14 NOTE — Telephone Encounter (Signed)
Spoke with the patient's mother. Patient stated vomiting repeatedly this morning around 6 am. It has continued through the day. Not retaining fluids. Has a fever of 101 orally. Should he go to the ED?

## 2018-11-14 NOTE — Telephone Encounter (Signed)
Yes, please advise him to come to ER for evaluation.

## 2018-11-14 NOTE — Progress Notes (Addendum)
Pharmacy Antibiotic Note  Edgar Brewer is a 58 y.o. male who presents on 11/14/2018 with vomiting, generalized weakness, and fever of 101. Pt's O2 saturation is 88% on room air. Most recent temperature is 103.1. There is concern for sepsis from an intra-abdominal source.  Pharmacy has been consulted for cefepime dosing.  WBCs is wnls at 7.7. Lactate is slightly elevated at 2.1.   Plan: Cefepime 1g IV q24 hours F/U HD schedule Continue metronidazole per MD Monitor WBC, Temp, Lactate, clinical status F/U length of therapy     Temp (24hrs), Avg:103.1 F (39.5 C), Min:103.1 F (39.5 C), Max:103.1 F (39.5 C)  Recent Labs  Lab 11/08/18 1216 11/14/18 1702 11/14/18 1703  WBC 4.7 7.7  --   CREATININE 3.09* 4.43*  --   LATICACIDVEN  --   --  2.1*    Estimated Creatinine Clearance: 19.3 mL/min (A) (by C-G formula based on SCr of 4.43 mg/dL (H)).    Allergies  Allergen Reactions  . Codeine Hives, Swelling and Other (See Comments)    Swelling all over body     Antimicrobials this admission: metronidazole 10/21 >>  Cefepime 10/21 >>   Microbiology results: 10/21 BCx: Sent 10/21 UCx: Sent  Thank you for allowing pharmacy to be a part of this patient's care.  Sherren Kerns, PharmD PGY1 Acute Care Pharmacy Resident 11/14/2018 6:59 PM

## 2018-11-14 NOTE — ED Triage Notes (Signed)
Pt accompanied by wife who states pt woke up this morning vomiting, generalized weakness and fever of 101. Pt drowsy in triage, c.o abd pain and dry heaving. Dialysis MWF but did not go today due to illness. Pt 88% on room air, does not wear oxygen at home, applied 2L and oxygen saturation increased to 97%. Febrile at 103.1. Pt ill appearing in triage

## 2018-11-14 NOTE — ED Notes (Signed)
PT unable to void at this time, PT reminded urine specimen is needed.

## 2018-11-14 NOTE — Telephone Encounter (Signed)
Pt's mother called to report that her son is on dialysis and that he has been vomiting.  She requested a call back.

## 2018-11-14 NOTE — ED Notes (Signed)
Still lethargic and and has persistent fever, Dr. Lily Kocher aware and discussed moving to progressive care instead.

## 2018-11-15 ENCOUNTER — Inpatient Hospital Stay (HOSPITAL_COMMUNITY): Payer: Medicare Other

## 2018-11-15 ENCOUNTER — Ambulatory Visit: Payer: Medicare Other | Admitting: Cardiovascular Disease

## 2018-11-15 DIAGNOSIS — J9601 Acute respiratory failure with hypoxia: Secondary | ICD-10-CM

## 2018-11-15 DIAGNOSIS — R7989 Other specified abnormal findings of blood chemistry: Secondary | ICD-10-CM

## 2018-11-15 DIAGNOSIS — Z992 Dependence on renal dialysis: Secondary | ICD-10-CM

## 2018-11-15 DIAGNOSIS — K810 Acute cholecystitis: Secondary | ICD-10-CM

## 2018-11-15 DIAGNOSIS — R6520 Severe sepsis without septic shock: Secondary | ICD-10-CM

## 2018-11-15 DIAGNOSIS — N186 End stage renal disease: Secondary | ICD-10-CM

## 2018-11-15 DIAGNOSIS — A419 Sepsis, unspecified organism: Secondary | ICD-10-CM

## 2018-11-15 DIAGNOSIS — A4151 Sepsis due to Escherichia coli [E. coli]: Principal | ICD-10-CM

## 2018-11-15 DIAGNOSIS — K651 Peritoneal abscess: Secondary | ICD-10-CM

## 2018-11-15 LAB — CBG MONITORING, ED
Glucose-Capillary: 243 mg/dL — ABNORMAL HIGH (ref 70–99)
Glucose-Capillary: 247 mg/dL — ABNORMAL HIGH (ref 70–99)
Glucose-Capillary: 225 mg/dL — ABNORMAL HIGH (ref 70–99)

## 2018-11-15 LAB — BLOOD CULTURE ID PANEL (REFLEXED)
Acinetobacter baumannii: NOT DETECTED
Candida albicans: NOT DETECTED
Candida glabrata: NOT DETECTED
Candida krusei: NOT DETECTED
Candida parapsilosis: NOT DETECTED
Candida tropicalis: NOT DETECTED
Carbapenem resistance: NOT DETECTED
Enterobacter cloacae complex: NOT DETECTED
Enterobacteriaceae species: DETECTED — AB
Enterococcus species: DETECTED — AB
Escherichia coli: DETECTED — AB
Haemophilus influenzae: NOT DETECTED
Klebsiella oxytoca: NOT DETECTED
Klebsiella pneumoniae: DETECTED — AB
Listeria monocytogenes: NOT DETECTED
Neisseria meningitidis: NOT DETECTED
Proteus species: NOT DETECTED
Pseudomonas aeruginosa: NOT DETECTED
Serratia marcescens: NOT DETECTED
Staphylococcus aureus (BCID): NOT DETECTED
Staphylococcus species: NOT DETECTED
Streptococcus agalactiae: NOT DETECTED
Streptococcus pneumoniae: NOT DETECTED
Streptococcus pyogenes: NOT DETECTED
Streptococcus species: NOT DETECTED
Vancomycin resistance: NOT DETECTED

## 2018-11-15 LAB — SARS CORONAVIRUS 2 (TAT 6-24 HRS): SARS Coronavirus 2: NEGATIVE

## 2018-11-15 LAB — GLUCOSE, CAPILLARY
Glucose-Capillary: 113 mg/dL — ABNORMAL HIGH (ref 70–99)
Glucose-Capillary: 163 mg/dL — ABNORMAL HIGH (ref 70–99)

## 2018-11-15 LAB — HEMOGLOBIN A1C
Hgb A1c MFr Bld: 7.3 % — ABNORMAL HIGH (ref 4.8–5.6)
Mean Plasma Glucose: 162.81 mg/dL

## 2018-11-15 LAB — CBC
HCT: 29.3 % — ABNORMAL LOW (ref 39.0–52.0)
Hemoglobin: 9.2 g/dL — ABNORMAL LOW (ref 13.0–17.0)
MCH: 29.1 pg (ref 26.0–34.0)
MCHC: 31.4 g/dL (ref 30.0–36.0)
MCV: 92.7 fL (ref 80.0–100.0)
Platelets: 104 10*3/uL — ABNORMAL LOW (ref 150–400)
RBC: 3.16 MIL/uL — ABNORMAL LOW (ref 4.22–5.81)
RDW: 16.1 % — ABNORMAL HIGH (ref 11.5–15.5)
WBC: 12.1 10*3/uL — ABNORMAL HIGH (ref 4.0–10.5)
nRBC: 0.2 % (ref 0.0–0.2)

## 2018-11-15 LAB — AMMONIA: Ammonia: 38 umol/L — ABNORMAL HIGH (ref 9–35)

## 2018-11-15 LAB — MRSA PCR SCREENING: MRSA by PCR: NEGATIVE

## 2018-11-15 MED ORDER — HEPARIN SODIUM (PORCINE) 1000 UNIT/ML DIALYSIS
1000.0000 [IU] | INTRAMUSCULAR | Status: DC | PRN
  Filled 2018-11-15: qty 1

## 2018-11-15 MED ORDER — CHLORHEXIDINE GLUCONATE CLOTH 2 % EX PADS: 6.0000 | MEDICATED_PAD | Freq: Every day | CUTANEOUS | Status: DC

## 2018-11-15 MED ORDER — PENTAFLUOROPROP-TETRAFLUOROETH EX AERO
1.0000 "application " | INHALATION_SPRAY | CUTANEOUS | Status: DC | PRN
  Filled 2018-11-15: qty 116

## 2018-11-15 MED ORDER — HEPARIN SODIUM (PORCINE) 1000 UNIT/ML DIALYSIS
2600.0000 [IU] | INTRAMUSCULAR | Status: DC | PRN
  Administered 2018-11-15: 12:00:00 2600 [IU] via INTRAVENOUS_CENTRAL
  Filled 2018-11-15 (×2): qty 3

## 2018-11-15 MED ORDER — SODIUM CHLORIDE 0.9 % IV SOLN
3.0000 g | INTRAVENOUS | Status: DC
  Administered 2018-11-15 – 2018-11-19 (×5): 3 g via INTRAVENOUS
  Filled 2018-11-15 (×2): qty 8
  Filled 2018-11-15: qty 3
  Filled 2018-11-15 (×2): qty 8
  Filled 2018-11-15 (×2): qty 3
  Filled 2018-11-15: qty 8

## 2018-11-15 MED ORDER — DARBEPOETIN ALFA 60 MCG/0.3ML IJ SOSY
60.0000 ug | PREFILLED_SYRINGE | INTRAMUSCULAR | Status: DC
  Administered 2018-11-16: 60 ug via INTRAVENOUS
  Filled 2018-11-15: qty 0.3

## 2018-11-15 MED ORDER — SODIUM CHLORIDE 0.9 % IV SOLN: 100.0000 mL | INTRAVENOUS | Status: DC | PRN

## 2018-11-15 MED ORDER — SODIUM CHLORIDE 0.9 % IV SOLN
2.0000 g | INTRAVENOUS | Status: DC
  Administered 2018-11-16: 10:00:00 2 g via INTRAVENOUS
  Filled 2018-11-15: qty 20

## 2018-11-15 MED ORDER — LIDOCAINE HCL (PF) 1 % IJ SOLN
5.0000 mL | INTRAMUSCULAR | Status: DC | PRN
  Filled 2018-11-15: qty 5

## 2018-11-15 MED ORDER — LIDOCAINE-PRILOCAINE 2.5-2.5 % EX CREA
1.0000 "application " | TOPICAL_CREAM | CUTANEOUS | Status: DC | PRN
  Filled 2018-11-15: qty 5

## 2018-11-15 MED ORDER — SODIUM CHLORIDE 0.9 % IV SOLN
2.0000 g | Freq: Every day | INTRAVENOUS | Status: DC
  Administered 2018-11-15: 2 g via INTRAVENOUS
  Filled 2018-11-15 (×2): qty 20

## 2018-11-15 MED ORDER — HEPARIN SODIUM (PORCINE) 1000 UNIT/ML IJ SOLN
INTRAMUSCULAR | Status: AC
  Filled 2018-11-15: qty 3

## 2018-11-15 MED ORDER — ALTEPLASE 2 MG IJ SOLR
2.0000 mg | Freq: Once | INTRAMUSCULAR | Status: DC | PRN
  Filled 2018-11-15: qty 2

## 2018-11-15 MED ORDER — SODIUM CHLORIDE 0.9 % IV SOLN
2.0000 g | Freq: Two times a day (BID) | INTRAVENOUS | Status: DC
  Filled 2018-11-15 (×2): qty 2000

## 2018-11-15 MED ORDER — LACTULOSE 10 GM/15ML PO SOLN
30.0000 g | Freq: Once | ORAL | Status: AC
  Administered 2018-11-15: 18:00:00 30 g via ORAL
  Filled 2018-11-15: qty 45

## 2018-11-15 NOTE — Progress Notes (Signed)
Patient ID: Edgar Brewer, male   DOB: 07/17/1960, 58 y.o.   MRN: [id number]       Subjective: Patient is known to the surgical service after undergoing a lap chole by Dr. Kieth Brightly on 9/10 for elevated LFTs.  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  His LFTs continued to remain elevated.  He was readmitted on 9/24 secondary to similar symptoms that he had prior to his cholecystectomy and still found to have elevated LFTs.  He underwent a HIDA scan which was negative.  No post operative complications were identified.  GI evaluated the patient and felt as if this was likely secondary to intrinsic liver dysfunction possibly.  He was ultimately discharged.  He returns today once again with right sided abdominal pain and nausea.  He is on HD as well.  He has some AMS with an elevated ammonia at 38.  He underwent a CT scan that revealed a possible small collection in the gallbladder fossa.  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  We are asked to see for further recommendations.  Objective: Vital signs in last 24 hours: Temp:  [99.4 F (37.4 C)-103.1 F (39.5 C)] 99.4 F (37.4 C) (10/22 0323) Pulse Rate:  [87-112] 87 (10/22 0902) Resp:  [16-37] 26 (10/22 0600) BP: (99-189)/(41-103) 106/52 (10/22 0902) SpO2:  [88 %-98 %] 97 % (10/22 0902)    Intake/Output from previous day: 10/21 0701 - 10/22 0700 In: 1100 [IV Piggyback:1100] Out: -  Intake/Output this shift: Total I/O In: 100 [IV Piggyback:100] Out: -   PE: Heart: regular Lungs: CTAB Abd: soft, tender in RLQ, nontender in RUQ, +BS, ND, incisions are all well healed. Psych: delayed response secondary to some AMS.    Lab Results:  Recent Labs    11/14/18 1702  WBC 7.7  HGB 10.6*  HCT 34.2*  PLT 190   BMET Recent Labs    11/14/18 1702  NA 133*  K 3.9  CL 95*  CO2 26  GLUCOSE 323*  BUN  32*  CREATININE 4.43*  CALCIUM 8.7*   PT/INR Recent Labs    11/14/18 1702  LABPROT 12.7  INR 1.0   CMP     Component Value Date/Time   NA 133 (L) 11/14/2018 1702   NA 140 03/04/2016 1317   K 3.9 11/14/2018 1702   CL 95 (L) 11/14/2018 1702   CO2 26 11/14/2018 1702   GLUCOSE 323 (H) 11/14/2018 1702   BUN 32 (H) 11/14/2018 1702   BUN 47 (H) 03/04/2016 1317   CREATININE 4.43 (H) 11/14/2018 1702   CALCIUM 8.7 (L) 11/14/2018 1702   PROT 6.6 11/14/2018 1702   PROT 5.2 (L) 02/29/2016 1439   ALBUMIN 3.1 (L) 11/14/2018 1702   ALBUMIN 3.1 (L) 02/29/2016 1439   AST 169 (H) 11/14/2018 1702   ALT 187 (H) 11/14/2018 1702   ALKPHOS 1,114 (H) 11/14/2018 1702   BILITOT 7.0 (H) 11/14/2018 1702   BILITOT 0.2 02/29/2016 1439   GFRNONAA 14 (L) 11/14/2018 1702   GFRAA 16 (L) 11/14/2018 1702   Lipase     Component Value Date/Time   LIPASE 45 10/18/2018 2358       Studies/Results: Ct Chest Wo Contrast  Result Date: 11/15/2018 CLINICAL DATA:  Shortness of breath, fever EXAM: CT CHEST WITHOUT CONTRAST TECHNIQUE: Multidetector CT imaging of the chest was performed following the standard protocol without IV contrast. COMPARISON:  None. FINDINGS: Cardiovascular: There are no significant vascular findings. There is a minimal pericardial effusion seen. Coronary artery calcifications are noted. Scattered aortic atherosclerotic calcifications are seen without aneurysmal dilatation. Mediastinum/Nodes: There are no enlarged mediastinal, hilar or axillary lymph nodes. The thyroid gland, trachea and esophagus demonstrate no significant findings. Lungs/Pleura: Patchy/streaky area of airspace opacities are seen at the posterior bilateral lower lungs. There are small amount of air bronchogram seen within this region, left greater than right. No pneumothorax or pleural effusion. Upper abdomen: Partially visualized low-density collection with foci of air within the gallbladder bed. A small hiatal hernia is  present. Musculoskeletal/Chest wall: There is no chest wall mass or suspicious osseous finding. No acute osseous abnormality IMPRESSION: 1. Mild patchy airspace disease in the posterior lower lungs, left greater than right. This likely represents atelectasis, less likely atypical infectious etiology. 2. Trace pericardial effusion 3.  Aortic Atherosclerosis (ICD10-I70.0). Electronically Signed   By: Prudencio Pair M.D.   On: 11/15/2018 01:07   Ct Abdomen Pelvis W Contrast  Addendum Date: 11/14/2018   ADDENDUM REPORT: 11/14/2018 23:28 ADDENDUM: The mild biliary ductal dilatation reported in the findings, appears to be new since the prior exam. There is also a new mild common bile duct dilatation measuring up to 0.2 cm with mild surrounding fat stranding changes. This could be due to choledocholithiasis. Electronically Signed   By: Prudencio Pair M.D.   On: 11/14/2018 23:28   Result Date: 11/14/2018 CLINICAL DATA:  Nausea vomiting abdominal pain, fever EXAM: CT ABDOMEN AND PELVIS WITH CONTRAST TECHNIQUE: Multidetector CT imaging of the abdomen and pelvis was performed using the standard protocol following bolus administration of intravenous contrast. CONTRAST:  163mL OMNIPAQUE IOHEXOL 300 MG/ML  SOLN COMPARISON:  October 18, 2018 FINDINGS: Lower chest: The visualized heart size within normal limits. No pericardial fluid/thickening. No hiatal hernia. There is patchy/streaky airspace opacity at both lung bases, slightly increased from the prior exam. Hepatobiliary: Within the gallbladder bed there is a 4.1 x 2.8 x 2.7 cm low-density collection now slightly increased in size from the prior exam with a small foci of air. There is mild intrahepatic biliary ductal dilatation. There does appear to be some fat stranding changes seen within the gallbladder bed. No other focal lesions seen.The main portal vein is patent. The patient is status post cholecystectomy. Pancreas: Unremarkable. No pancreatic ductal dilatation or  surrounding inflammatory changes. Spleen: Normal in size without focal abnormality. Adrenals/Urinary Tract: Both adrenal glands appear normal. The kidneys and collecting system appear normal without evidence of urinary tract calculus or hydronephrosis. Bladder is unremarkable. Again noted are bilateral perinephric stranding. Stomach/Bowel: The stomach, small bowel, and colon are normal in appearance. No inflammatory changes, wall thickening, or obstructive findings.The appendix is normal. Vascular/Lymphatic: There are no enlarged mesenteric, retroperitoneal, or pelvic lymph nodes. Scattered aortic atherosclerotic calcifications are seen without aneurysmal dilatation. Reproductive: The prostate is unremarkable. Other: No evidence of abdominal wall mass or hernia. Musculoskeletal: No acute or significant osseous findings. IMPRESSION: 1. Interval slight increase in the fluid collection within the gallbladder bed now with a small foci of air, measuring 4.1 x 2.8 x 2.7 cm. Given the patient's history this could represent a infected collection within the liver/gallbladder bed. 2. Slight interval increase in the patchy/streaky airspace opacities at both lung bases which could represent atelectasis and/or atypical infectious etiology. Electronically Signed: By: Prudencio Pair M.D. On: 11/14/2018 20:10   Mr Abdomen Mrcp Wo Contrast  Result Date: 11/15/2018 CLINICAL DATA:  Jaundice, abdominal pain.  Elevated liver function tests, nausea, and vomiting. Laparoscopic cholecystectomy on 10/04/2018. Mild biliary dilatation on CT of 11/14/2018, query choledocholithiasis. EXAM: MRI ABDOMEN WITHOUT CONTRAST  (INCLUDING MRCP) TECHNIQUE: Multiplanar multisequence MR imaging of the abdomen was performed. Heavily T2-weighted images of the biliary and pancreatic ducts were obtained, and three-dimensional MRCP images were rendered by post processing. COMPARISON:  11/14/2018 FINDINGS: Lower chest: Mild airspace opacities in the posterior  basal segments of both lower lobes. Mild cardiomegaly. Hepatobiliary: Periportal edema. Small amount of gas and fluid in the gallbladder fossa at the site of cholecystectomy. This gas is abnormal 12 days out from surgery. Mild reduction of hepatic parenchymal signal on inphase images compared to out of phase images, along with reduced T2 signal in the spleen, suspicious for hemosiderosis. Currently there is no biliary dilatation. Pancreas:  Pancreas divisum. No findings of acute pancreatitis. Spleen: Less than expected T2 signal and less than expected in phase signal in the spleen, suspicious for hemosiderosis. Adrenals/Urinary Tract: 4 mm fluid signal intensity lesion of the left mid kidney on image 29/4, likely a cyst but too small to characterize. The adrenal glands appear normal. No hydronephrosis. Minimal perirenal stranding bilaterally similar to recent CT appearance. Stomach/Bowel: Diverticulum of the transverse duodenum. Numerous scattered colonic diverticula. Vascular/Lymphatic: Aortoiliac atherosclerotic vascular disease. No pathologic adenopathy identified. Other:  No supplemental non-categorized findings. Musculoskeletal: There is evidence of lumbar degenerative disc disease. IMPRESSION: 1. Small amount of gas and fluid in the gallbladder fossa at the site of cholecystectomy. This gas is abnormal 12 days out from surgery, and the possibility of a small abscess along the gallbladder fossa is raised. 2. No current biliary dilatation. There is nonspecific periportal edema. 3. 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 4. Pancreas divisum. 5. Mild airspace opacities in the posterior basal segments of both lower lobes. 6. Mild cardiomegaly. 7. Colonic diverticulosis. 8.  Aortic Atherosclerosis (ICD10-I70.0). Electronically Signed   By: Van Clines M.D.   On: 11/15/2018 08:58   Dg Chest Port 1 View  Result Date: 11/14/2018 CLINICAL DATA:  Sepsis EXAM: PORTABLE CHEST 1  VIEW COMPARISON:  July 05, 2016 FINDINGS: The heart size and mediastinal contours is unchanged. There is patchy airspace opacity/blunting seen at the left lung base. Pulmonary vascular congestion is seen. The visualized skeletal structures are unremarkable. IMPRESSION: Pulmonary vascular congestion. Patchy airspace opacity with blunting seen at the left costophrenic angle which could represent small effusion, atelectasis, and/or early infectious etiology. Electronically Signed   By: Prudencio Pair M.D.   On: 11/14/2018 19:05    Anti-infectives: Anti-infectives (From admission, onward)   Start     Dose/Rate Route Frequency Ordered Stop   11/15/18 0800  cefTRIAXone (ROCEPHIN) 2 g in sodium chloride 0.9 % 100 mL IVPB     2 g 200 mL/hr over 30 Minutes Intravenous Daily 11/15/18 0625     11/15/18 0700  ampicillin (OMNIPEN) 2 g in sodium chloride 0.9 % 100 mL IVPB     2 g 300 mL/hr over 20 Minutes Intravenous Every 12 hours 11/15/18 0625     11/15/18 0600  metroNIDAZOLE (FLAGYL) IVPB 500 mg     500 mg 100 mL/hr over 60 Minutes Intravenous Every 8 hours 11/14/18 2308     11/14/18 1845  ceFEPIme (MAXIPIME) 2 g in sodium chloride 0.9 % 100 mL IVPB  Status:  Discontinued     2 g 200 mL/hr over 30 Minutes Intravenous  Once 11/14/18 1837 11/14/18 1841   11/14/18 1845  metroNIDAZOLE (FLAGYL) IVPB 500 mg     500 mg 100 mL/hr over 60 Minutes Intravenous  Once 11/14/18 1837 11/14/18 2040   11/14/18 1845  ceFEPIme (MAXIPIME) 2 g in sodium chloride 0.9 % 100 mL IVPB  Status:  Discontinued     2 g 200 mL/hr over 30 Minutes Intravenous Every 24 hours 11/14/18 1841 11/14/18 1842   11/14/18 1845  ceFEPIme (MAXIPIME) 1 g in sodium chloride 0.9 % 100 mL IVPB  Status:  Discontinued     1 g 200 mL/hr over 30 Minutes Intravenous Every 24 hours 11/14/18 1842 11/15/18 6629       Assessment/Plan Polymicrobial bacteremia - per medicine MMP - per medicine/renal, etc  S/p lap chole 9/10 by Dr. Kieth Brightly with small  gallbladder fossa fluid collection The patient is over a month out from his lap chole.  Bile leak has been ruled out.  He has been found to have a small collection in his gallbladder fossa, which is going to be aspirated by IR.  This is high unlikely to be causing the elevation in his LFTs and other symptoms.  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  Consider GI and/o hem/onc consult for further evaluation and work up of this finding.  This would certainly be more attributable to his elevated LFTs.  Will follow for now.  No surgical needs at this time.    FEN - NPO for procedure VTE - heparin ID - Rocephin/Flagyl   LOS: 1 day    [name] , [facility] 11/15/2018, 11:26 AM Please see Amion for pager number during day hours 7:00am-4:30pm

## 2018-11-15 NOTE — Progress Notes (Signed)
PHARMACY - PHYSICIAN COMMUNICATION CRITICAL VALUE ALERT - BLOOD CULTURE IDENTIFICATION (BCID)  Edgar Brewer is an 58 y.o. male who presented to [facility] on 11/14/2018 with a chief complaint of fevers, intra-abdominal abcess  Assessment:  Blood cultures growing Enterococcus, E. coli, and Klebsiella pneumoniae  Name of physician (or Provider) Contacted: Tylene Fantasia  Current antibiotics: Cefepime and Flagyl  Changes to prescribed antibiotics recommended:   Change Cefepime to Rocephin and Ampicillin  Results for orders placed or performed during the hospital encounter of 11/14/18  Blood Culture ID Panel (Reflexed) (Collected: 11/14/2018  5:03 PM)  Result Value Ref Range   Enterococcus species DETECTED (A) NOT DETECTED   Vancomycin resistance NOT DETECTED NOT DETECTED   Listeria monocytogenes NOT DETECTED NOT DETECTED   Staphylococcus species NOT DETECTED NOT DETECTED   Staphylococcus aureus (BCID) NOT DETECTED NOT DETECTED   Streptococcus species NOT DETECTED NOT DETECTED   Streptococcus agalactiae NOT DETECTED NOT DETECTED   Streptococcus pneumoniae NOT DETECTED NOT DETECTED   Streptococcus pyogenes NOT DETECTED NOT DETECTED   Acinetobacter baumannii NOT DETECTED NOT DETECTED   Enterobacteriaceae species DETECTED (A) NOT DETECTED   Enterobacter cloacae complex NOT DETECTED NOT DETECTED   Escherichia coli DETECTED (A) NOT DETECTED   Klebsiella oxytoca NOT DETECTED NOT DETECTED   Klebsiella pneumoniae DETECTED (A) NOT DETECTED   Proteus species NOT DETECTED NOT DETECTED   Serratia marcescens NOT DETECTED NOT DETECTED   Carbapenem resistance NOT DETECTED NOT DETECTED   Haemophilus influenzae NOT DETECTED NOT DETECTED   Neisseria meningitidis NOT DETECTED NOT DETECTED   Pseudomonas aeruginosa NOT DETECTED NOT DETECTED   Candida albicans NOT DETECTED NOT DETECTED   Candida glabrata NOT DETECTED NOT DETECTED   Candida krusei NOT DETECTED NOT DETECTED   Candida parapsilosis NOT  DETECTED NOT DETECTED   Candida tropicalis NOT DETECTED NOT DETECTED    Caryl Pina 11/15/2018  6:11 AM

## 2018-11-15 NOTE — Consult Note (Signed)
Chief Complaint: Patient was seen in consultation today for gallbladder fossa fluid collection drainage.  Referring Physician(s): Dr. Loleta Books  Supervising Physician: Edgar [facility]er  Patient Status: Edgar Brewer - ED  History of Present Illness: Edgar Brewer is a 58 y.o. male with a past medical history significant for GERD, HTN, DM and ESRD on HD MWF via AVF who presented to [facility] ED yesterday evening with complaints of abdominal pain, nausea, vomiting, weakness, fever and drowsiness. He had undergone a laparoscopic cholecystectomy on 10/04/18 with CCS and was discharged home on 10/06/18. He presented to the ED on 9/24 with complaints of nausea, vomiting and fever - he was found to have elevated LFTs and a CT abd/pelvis showed a 3 x 2 cm fluid pocket along the inferior surface of the liver. HIDA scan was obtained and was negative for bile leak. He was started on empiric antibiotics and discharged home on 9/28.  Work up in the ED today was notable for fever, tachycardia, tachypnea, jaundice, elevated LFTs and elevated bilirubin. CT abdomen/pelvis with contrast was obtained which showed a slight interval increase in the fluid collection within the gallbladder bed now with a small foci of air, measuring 4.1 x 2.8 x 2.7 cm. On call IR MD was consulted who recommended MRCP to evaluate for bile duct stone which was performed this morning which showed a small amount of gas and fluid in the gallbladder fossa without biliary dilatation and signal findings in the liver and spleen suspicious for hemosiderosis. Patient imaging has been reviewed by Dr. Annamaria Brewer today who agrees to proceed with CT guided aspiration of the gall bladder fossa fluid collection.   Patient is very somnolent on exam today, he takes quite a long time to answer simple questions and appears confused. He attempts to answer the phone during my exam however he cannot figure out the buttons. He states that he "just feels bad" and is concerned that no one has  called his mother. He states understanding of the requested procedure but wishes for this to be explained to his mother. I spoke with [name] and sister Edgar Brewer via phone today who both give consent for image guided aspiration.   Past Medical History:  Diagnosis Date   Acute on chronic combined systolic and diastolic CHF, NYHA class 1 ([place]) 01/13/2015   Arthritis    Chronic kidney disease    MWF [facility].   Diabetes mellitus type 2 with complications ([facility])    Previous insulin, non-insulin requiring noted 09/2018.  Complications include retinopathy, peripheral neuropathy.   GERD (gastroesophageal reflux disease)    at times   Head injury with loss of consciousness (Edgar Brewer)    History of kidney stones    Hypertension    Neuropathy    Pulmonary hypertension (Edgar [facility])    PA peak pressure 41 mmHg 03/04/16 echo   Varicose veins of lower extremities with complications 48/54/6270    Past Surgical History:  Procedure Laterality Date   AV FISTULA PLACEMENT Left 06/01/2016   Procedure: ARTERIOVENOUS (AV) FISTULA CREATION;  Surgeon: Edgar Posner, MD;  Location: [place];  Service: Vascular;  Laterality: Left;   AV FISTULA PLACEMENT Left 08/10/2017   Procedure: BRACHIOCEPHALIC ARTERIOVENOUS (AV) FISTULA CREATION LEFT UPPER EXTREMITY;  Surgeon: [name], MD;  Location: [place];  Service: Vascular;  Laterality: Left;   CHOLECYSTECTOMY N/A 10/04/2018   Procedure: LAPAROSCOPIC CHOLECYSTECTOMY;  Surgeon: Edgar Skinner, MD;  Location: [place];  Service: General;  Laterality: N/A;   COLONOSCOPY  W/ POLYPECTOMY  03/2016   For evaluation of iron deficiency anemia.  Dr. Silverio Brewer.  10 polyps, largest 20 mm.  Pathology: tubular adenomas with no high-grade dysplasia, hyperplastic.  None bleeding internal hemorrhoids.  Pandiverticulosis.   ENDOVENOUS ABLATION SAPHENOUS VEIN W/ LASER Left 07-23-2015   endovenous laser ablation left greater saphenous vein by [name]    ENDOVENOUS ABLATION SAPHENOUS VEIN W/ LASER Right 10/15/2015   EVLA right greater saphenous vein by [name]   ESOPHAGOGASTRODUODENOSCOPY  03/2016   For evaluation of IDA.  Dr. Silverio Brewer.  Duodenal erythema, pathology benign mucosa, no villous atrophy.    Allergies: Codeine  Medications: Prior to Admission medications   Medication Sig Start Date End Date Taking? Authorizing Provider  b complex-vitamin c-folic acid (NEPHRO-VITE) 0.8 MG TABS tablet Take 1 tablet by mouth daily.   Yes [provider]  cinacalcet (SENSIPAR) 60 MG tablet Take 60 mg by mouth daily.   Yes [provider]  doxercalciferol (HECTOROL) 4 MCG/2ML injection Inject 5 mLs (10 mcg total) into the vein every Monday, Wednesday, and Friday with hemodialysis. 10/08/18  Yes Brewer, Ava, DO  gabapentin (NEURONTIN) 300 MG capsule Take 300 mg by mouth See admin instructions. Take 300 mg by mouth in the morning and 300 mg at bedtime   Yes [provider]  ibuprofen (ADVIL) 200 MG tablet Take 200-800 mg by mouth every 8 (eight) hours as needed for headache or mild pain.   Yes [provider]  linagliptin (TRADJENTA) 5 MG TABS tablet Take 5 mg by mouth daily.   Yes [provider]  omeprazole (PRILOSEC) 20 MG capsule Take 20 mg by mouth daily before breakfast.  11/25/17  Yes [provider]  ondansetron (ZOFRAN) 4 MG tablet Take 1 tablet (4 mg total) by mouth every 6 (six) hours as needed for nausea. Patient taking differently: Take 4 mg by mouth every 6 (six) hours as needed for nausea or vomiting.  10/22/18  Yes Edgar August, MD  sevelamer carbonate (RENVELA) 800 MG tablet Take 2 tablets (1,600 mg total) by mouth 3 (three) times daily with meals. 10/22/18  Yes Edgar August, MD  cinacalcet (SENSIPAR) 30 MG tablet Take 2 tablets (60 mg total) by mouth daily with supper. Patient not taking: Reported on 11/14/2018 10/06/18   Brewer, Ava, DO  traMADol (ULTRAM) 50 MG tablet Take 1  tablet (50 mg total) by mouth every 12 (twelve) hours as needed for moderate pain. Patient not taking: Reported on 11/14/2018 10/22/18   Edgar August, MD     Family History  Problem Relation Age of Onset   Cancer Father     Social History   Socioeconomic History   Marital status: Legally Separated    Spouse name: Not on file   Number of children: Not on file   Years of education: Not on file   Highest education level: Not on file  Occupational History   Not on file  Social Needs   Financial resource strain: Not on file   Food insecurity    Worry: Never true    Inability: Never true   Transportation needs    Medical: No    Non-medical: No  Tobacco Use   Smoking status: Former Smoker    Packs/day: 1.00    Years: 45.00    Pack years: 45.00    Types: Cigarettes    Quit date: 01/13/2015    Years since quitting: 3.8   Smokeless tobacco: Never Used  Substance and  Sexual Activity   Alcohol use: Yes    Alcohol/week: 1.0 standard drinks    Types: 1 Glasses of wine per week    Comment: rare occassion   Drug use: No   Sexual activity: Not on file  Lifestyle   Physical activity    Days per week: Not on file    Minutes per session: Not on file   Stress: Not on file  Relationships   Social connections    Talks on phone: Not on file    Gets together: Not on file    Attends religious service: Not on file    Active member of club or organization: Not on file    Attends meetings of clubs or organizations: Not on file    Relationship status: Not on file  Other Topics Concern   Not on file  Social History Narrative   Not on file     Review of Systems: A 12 point ROS discussed and pertinent positives are indicated in the HPI above.  All other systems are negative.  Review of Systems  Unable to perform ROS: Mental status change    Vital Signs: BP (!) 106/52    Pulse 87    Temp 99.4 F (37.4 C) (Oral)    Resp (!) 26    SpO2 97%   Physical  Exam Vitals signs and nursing note reviewed.  Constitutional:      General: He is not in acute distress.    Appearance: He is obese. He is ill-appearing.     Comments: Very somnolent on exam, answers questions intermittently.  HENT:     Head: Normocephalic.     Mouth/Throat:     Mouth: Mucous membranes are moist.     Pharynx: Oropharynx is clear. No oropharyngeal exudate or posterior oropharyngeal erythema.  Eyes:     General: Scleral icterus present.  Cardiovascular:     Rate and Rhythm: Normal rate and regular rhythm.  Pulmonary:     Effort: Pulmonary effort is normal.     Breath sounds: Normal breath sounds.  Abdominal:     General: There is no distension.     Palpations: Abdomen is soft.     Tenderness: There is abdominal tenderness (Right sided - mostly lower quadrant).  Skin:    General: Skin is warm and dry.     Coloration: Skin is jaundiced.  Neurological:     Comments: Oriented to self and Brewer - does not answer some orientation questions; difficult to assess orientation.  Psychiatric:        Mood and Affect: Mood normal.        Behavior: Behavior normal.        Thought Content: Thought content normal.        Judgment: Judgment normal.      MD Evaluation Airway: WNL Heart: WNL Abdomen: WNL Chest/ Lungs: WNL ASA  Classification: 3 Mallampati/Airway Score: Two   Imaging: Ct Chest Wo Contrast  Result Date: 11/15/2018 CLINICAL DATA:  Shortness of breath, fever EXAM: CT CHEST WITHOUT CONTRAST TECHNIQUE: Multidetector CT imaging of the chest was performed following the standard protocol without IV contrast. COMPARISON:  None. FINDINGS: Cardiovascular: There are no significant vascular findings. There is a minimal pericardial effusion seen. Coronary artery calcifications are noted. Scattered aortic atherosclerotic calcifications are seen without aneurysmal dilatation. Mediastinum/Nodes: There are no enlarged mediastinal, hilar or axillary lymph nodes. The  thyroid gland, trachea and esophagus demonstrate no significant findings. Lungs/Pleura: Patchy/streaky area of airspace opacities are seen  at the posterior bilateral lower lungs. There are small amount of air bronchogram seen within this region, left greater than right. No pneumothorax or pleural effusion. Upper abdomen: Partially visualized low-density collection with foci of air within the gallbladder bed. A small hiatal hernia is present. Musculoskeletal/Chest wall: There is no chest wall mass or suspicious osseous finding. No acute osseous abnormality IMPRESSION: 1. Mild patchy airspace disease in the posterior lower lungs, left greater than right. This likely represents atelectasis, less likely atypical infectious etiology. 2. Trace pericardial effusion 3.  Aortic Atherosclerosis (ICD10-I70.0). Electronically Signed   By: Prudencio Pair M.D.   On: 11/15/2018 01:07   Ct Abdomen Pelvis W Contrast  Addendum Date: 11/14/2018   ADDENDUM REPORT: 11/14/2018 23:28 ADDENDUM: The mild biliary ductal dilatation reported in the findings, appears to be new since the prior exam. There is also a new mild common bile duct dilatation measuring up to 0.2 cm with mild surrounding fat stranding changes. This could be due to choledocholithiasis. Electronically Signed   By: Prudencio Pair M.D.   On: 11/14/2018 23:28   Result Date: 11/14/2018 CLINICAL DATA:  Nausea vomiting abdominal pain, fever EXAM: CT ABDOMEN AND PELVIS WITH CONTRAST TECHNIQUE: Multidetector CT imaging of the abdomen and pelvis was performed using the standard protocol following bolus administration of intravenous contrast. CONTRAST:  191mL OMNIPAQUE IOHEXOL 300 MG/ML  SOLN COMPARISON:  October 18, 2018 FINDINGS: Lower chest: The visualized heart size within normal limits. No pericardial fluid/thickening. No hiatal hernia. There is patchy/streaky airspace opacity at both lung bases, slightly increased from the prior exam. Hepatobiliary: Within the  gallbladder bed there is a 4.1 x 2.8 x 2.7 cm low-density collection now slightly increased in size from the prior exam with a small foci of air. There is mild intrahepatic biliary ductal dilatation. There does appear to be some fat stranding changes seen within the gallbladder bed. No other focal lesions seen.The main portal vein is patent. The patient is status post cholecystectomy. Pancreas: Unremarkable. No pancreatic ductal dilatation or surrounding inflammatory changes. Spleen: Normal in size without focal abnormality. Adrenals/Urinary Tract: Both adrenal glands appear normal. The kidneys and collecting system appear normal without evidence of urinary tract calculus or hydronephrosis. Bladder is unremarkable. Again noted are bilateral perinephric stranding. Stomach/Bowel: The stomach, small bowel, and colon are normal in appearance. No inflammatory changes, wall thickening, or obstructive findings.The appendix is normal. Vascular/Lymphatic: There are no enlarged mesenteric, retroperitoneal, or pelvic lymph nodes. Scattered aortic atherosclerotic calcifications are seen without aneurysmal dilatation. Reproductive: The prostate is unremarkable. Other: No evidence of abdominal wall mass or hernia. Musculoskeletal: No acute or significant osseous findings. IMPRESSION: 1. Interval slight increase in the fluid collection within the gallbladder bed now with a small foci of air, measuring 4.1 x 2.8 x 2.7 cm. Given the patient's history this could represent a infected collection within the liver/gallbladder bed. 2. Slight interval increase in the patchy/streaky airspace opacities at both lung bases which could represent atelectasis and/or atypical infectious etiology. Electronically Signed: By: Prudencio Pair M.D. On: 11/14/2018 20:10   Ct Abdomen Pelvis W Contrast  Addendum Date: 10/23/2018   ADDENDUM REPORT: 10/23/2018 15:07 ADDENDUM: Please note that the patient has a history of cholecystectomy. Electronically  Signed   By: Anner Crete M.D.   On: 10/23/2018 15:07   Result Date: 10/23/2018 CLINICAL DATA:  58 year old male with nausea and vomiting. Concern for retained stone. EXAM: CT ABDOMEN AND PELVIS WITH CONTRAST TECHNIQUE: Multidetector CT imaging of the abdomen and pelvis  was performed using the standard protocol following bolus administration of intravenous contrast. CONTRAST:  132mL OMNIPAQUE IOHEXOL 300 MG/ML  SOLN COMPARISON:  CT of the abdomen pelvis dated 10/02/2018 FINDINGS: Lower chest: Bibasilar linear atelectasis/scarring. No intra-abdominal free air or free fluid. Hepatobiliary: Probable mild fatty infiltration of the liver. No intrahepatic biliary ductal dilatation. Cholecystectomy. There is a 3 x 2 cm fluid pocket along the inferior surface of the liver in the cholecystectomy. This may represent a seroma. A biloma or an abscess are not excluded. Clinical correlation is recommended. No retained calcified stone noted the central CBD. Pancreas: Unremarkable. No pancreatic ductal dilatation or surrounding inflammatory changes. Spleen: Mild splenomegaly measuring up to 15 cm length. Adrenals/Urinary Tract: The adrenal glands are unremarkable. There is no hydronephrosis on either side. There is symmetric enhancement and excretion of contrast by both kidneys. The visualized ureters and urinary bladder appear unremarkable. Stomach/Bowel: There is sigmoid diverticulosis and scattered colonic diverticula. Mild haziness adjacent to the hepatic flexure diverticula likely related to stranding from cholecystectomy and less likely related to diverticulitis. There is no bowel obstruction. The appendix is normal. Vascular/Lymphatic: Moderate aortoiliac atherosclerotic disease. IVC is unremarkable. No portal venous gas. There is no adenopathy. Reproductive: The prostate and seminal vesicles are grossly unremarkable. No pelvic mass. Other: None Musculoskeletal: Degenerative changes of the spine. L5-S1 disc desiccation  and vacuum phenomena. No acute osseous pathology. IMPRESSION: 1. A 3 x 2 cm fluid pocket along the inferior surface of the liver in the cholecystectomy. This may represent a seroma. A biloma or an abscess are not excluded. Clinical correlation is recommended. No retained calcified stone noted in the central CBD. 2. Colonic diverticulosis. Minimal stranding adjacent to the hepatic flexure, likely secondary to cholecystectomy and less likely acute cholecystitis. No bowel obstruction. Normal appendix. Aortic Atherosclerosis (ICD10-I70.0). Electronically Signed: By: Anner Crete M.D. On: 10/18/2018 20:40   Mr Abdomen Mrcp Wo Contrast  Result Date: 11/15/2018 CLINICAL DATA:  Jaundice, abdominal pain. Elevated liver function tests, nausea, and vomiting. Laparoscopic cholecystectomy on 10/04/2018. Mild biliary dilatation on CT of 11/14/2018, query choledocholithiasis. EXAM: MRI ABDOMEN WITHOUT CONTRAST  (INCLUDING MRCP) TECHNIQUE: Multiplanar multisequence MR imaging of the abdomen was performed. Heavily T2-weighted images of the biliary and pancreatic ducts were obtained, and three-dimensional MRCP images were rendered by post processing. COMPARISON:  11/14/2018 FINDINGS: Lower chest: Mild airspace opacities in the posterior basal segments of both lower lobes. Mild cardiomegaly. Hepatobiliary: Periportal edema. Small amount of gas and fluid in the gallbladder fossa at the site of cholecystectomy. This gas is abnormal 12 days out from surgery. Mild reduction of hepatic parenchymal signal on inphase images compared to out of phase images, along with reduced T2 signal in the spleen, suspicious for hemosiderosis. Currently there is no biliary dilatation. Pancreas:  Pancreas divisum. No findings of acute pancreatitis. Spleen: Less than expected T2 signal and less than expected in phase signal in the spleen, suspicious for hemosiderosis. Adrenals/Urinary Tract: 4 mm fluid signal intensity lesion of the left mid kidney  on image 29/4, likely a cyst but too small to characterize. The adrenal glands appear normal. No hydronephrosis. Minimal perirenal stranding bilaterally similar to recent CT appearance. Stomach/Bowel: Diverticulum of the transverse duodenum. Numerous scattered colonic diverticula. Vascular/Lymphatic: Aortoiliac atherosclerotic vascular disease. No pathologic adenopathy identified. Other:  No supplemental non-categorized findings. Musculoskeletal: There is evidence of lumbar degenerative disc disease. IMPRESSION: 1. Small amount of gas and fluid in the gallbladder fossa at the site of cholecystectomy. This gas is abnormal 12 days  out from surgery, and the possibility of a small abscess along the gallbladder fossa is raised. 2. No current biliary dilatation. There is nonspecific periportal edema. 3. 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 4. Pancreas divisum. 5. Mild airspace opacities in the posterior basal segments of both lower lobes. 6. Mild cardiomegaly. 7. Colonic diverticulosis. 8.  Aortic Atherosclerosis (ICD10-I70.0). Electronically Signed   By: Van Clines M.D.   On: 11/15/2018 08:58   Dg Chest Port 1 View  Result Date: 11/14/2018 CLINICAL DATA:  Sepsis EXAM: PORTABLE CHEST 1 VIEW COMPARISON:  July 05, 2016 FINDINGS: The heart size and mediastinal contours is unchanged. There is patchy airspace opacity/blunting seen at the left lung base. Pulmonary vascular congestion is seen. The visualized skeletal structures are unremarkable. IMPRESSION: Pulmonary vascular congestion. Patchy airspace opacity with blunting seen at the left costophrenic angle which could represent small effusion, atelectasis, and/or early infectious etiology. Electronically Signed   By: Prudencio Pair M.D.   On: 11/14/2018 19:05   Nm Hepato Biliary Leak  Result Date: 10/19/2018 CLINICAL DATA:  Nausea and vomiting following cholecystectomy. Laparoscopic cholecystectomy on 10/04/2018. EXAM: NUCLEAR  MEDICINE HEPATOBILIARY IMAGING TECHNIQUE: Sequential images of the abdomen were obtained out to 60 minutes following intravenous administration of radiopharmaceutical. RADIOPHARMACEUTICALS:  5.4 mCi Tc-61m  Choletec IV COMPARISON:  CT 10/18/2018 FINDINGS: Prompt clearance of radiotracer from the blood pool and homogeneous uptake in liver. There is slow transit of radiotracer from the hepatic parenchyma into the bile ducts. The common bile duct is only evident by 85 minutes. By 120 minutes small amount of radioactive bile does entered the small bowel. No evidence of extra hepatic accumulation of radiotracer to suggest bile leak. IMPRESSION: 1. No evidence of bile leak. 2. Poor transient of radiotracer from the hepatic parenchyma suggest cholestasis versus hepatitis. 3. Patent common bile duct. Electronically Signed   By: Suzy Bouchard M.D.   On: 10/19/2018 17:41    Labs:  CBC: Recent Labs    10/22/18 0426 11/01/18 0913 11/08/18 1216 11/14/18 1702  WBC 5.2 3.1* 4.7 7.7  HGB 10.5* 10.4* 10.4* 10.6*  HCT 31.0* 31.1* 30.8* 34.2*  PLT 243 182.0 240.0 190    COAGS: Recent Labs    10/19/18 0321 10/22/18 0426 11/01/18 0913 11/14/18 1702 11/14/18 1837  INR 1.2 1.0 1.0 1.0  --   APTT  --   --   --   --  41*    BMP: Recent Labs    10/20/18 0528 10/21/18 0617 10/22/18 0426 11/01/18 0913 11/08/18 1216 11/14/18 1702  NA 133* 135 134* 136 136 133*  K 3.7 3.6 3.9 3.4* 3.5 3.9  CL 93* 93* 93* 92* 93* 95*  CO2 27 26 24  34* 34* 26  GLUCOSE 164* 195* 233* 247* 247* 323*  BUN 20 36* 47* 18 18 32*  CALCIUM 9.0 8.8* 8.5* 8.7 8.7 8.7*  CREATININE 5.24* 8.47* 10.92* 3.96* 3.09* 4.43*  GFRNONAA 11* 6* 5*  --   --  14*  GFRAA 13* 7* 5*  --   --  16*    LIVER FUNCTION TESTS: Recent Labs    10/25/18 1304 11/01/18 0913 11/08/18 1216 11/14/18 1702  BILITOT 1.5* 3.7* 5.1* 7.0*  AST 128* 58* 47* 169*  ALT 270* 106* 102* 187*  ALKPHOS 1,139* 1,299*   1,072* 1,087* 1,114*  PROT 7.0 6.9  6.6 6.6  ALBUMIN 3.7 3.5 3.5 3.1*    TUMOR MARKERS: No results for input(s): AFPTM, CEA, CA199, CHROMGRNA in  the last 8760 hours.  Assessment and Plan:  58 y/o M with history of laparoscopic cholecystectomy 10/04/18 who presented to [facility] ED with complaints of abdominal pain, nausea, vomiting, weakness, fever and somnolence. Work up in the ED today was notable for fever, tachycardia, tachypnea, jaundice, elevated LFTs and elevated bilirubin. CT abdomen/pelvis with contrast was obtained which showed a slight interval increase in the fluid collection within the gallbladder bed now with a small foci of air, measuring 4.1 x 2.8 x 2.7 cm. On call IR MD was consulted who recommended MRCP to evaluate for bile duct stone which was performed this morning which showed a small amount of gas and fluid in the gallbladder fossa without biliary dilatation and signal findings in the liver and spleen suspicious for hemosiderosis. Patient imaging has been reviewed by Dr. Annamaria Brewer today who agrees to proceed with CT guided aspiration of the gall bladder fossa fluid collection.   Discussed procedure with patient in the ED today who states he agrees to the procedure but would like for me to discuss it with his mother first before proceeding - I spoke with his mother Lenell Brewer and sister Edgar Brewer today via phone. All questions were answered and both are in agreement with proceeding. Given patient's somnolence and confusion consent was obtained via phone from patient's mother.  Risks and benefits discussed with the patient, his mother and sister including bleeding, infection, damage to adjacent structures, bowel perforation/fistula connection, and sepsis.  All of the patient's family's questions were answered, patient and his family are agreeable to proceed.  Consent signed and in IR control room.  Thank you for this interesting consult.  I greatly enjoyed meeting Dhiren Azimi and look forward to participating in their care.  A  copy of this report was sent to the requesting provider on this date.  Electronically Signed: Joaquim Nam, PA-C 11/15/2018, 11:15 AM   I spent a total of 40 Minutes in face to face in clinical consultation, greater than 50% of which was counseling/coordinating care for gallbladder fossa fluid collection aspiration.

## 2018-11-15 NOTE — Progress Notes (Signed)
Patient trasfered from Dialysis to [phone] via stretcher; alert and oriented x 3; confused at times; no complaints of pain; IV saline locked in RFA; skin intact, redness blanchable bilateral buttocks, scratches BLE. Orient patient to room and unit; gave patient care guide; instructed how to use the call bell and  fall risk precautions; patient's mother at bedside. Will continue to monitor the patient.

## 2018-11-15 NOTE — Consult Note (Addendum)
[facility] for Infectious Disease    Date of Admission:  11/14/2018     Total days of antibiotics 2        Reason for Consult: Antibiotic regimen     Referring Provider: Myrene Buddy, MD Primary Care Provider: Vassie Moment, MD  Assessment: In summary, Edgar Brewer is a 58 year old gentleman with a history of ESRD (HD MWF), chronic diastolic CHF, A6TK, GERD, HTN who presented yesterday with fever (Tmax 103.1), tachycardia, and respiratory distress with Enterococcus species, E. coli, and Klebsiella pneumonia bacteremia.  MRCP notable for gas and fluid collection in the gallbladder fossa suspicious for a small abscesses at the site fo cholecystectomy.   Plan: 1. Discontinue Ceftriaxone, ampicillin & Flagyl 2. Start Unasyn. Dosing per pharmacy  3. TTE 4. Repeat blood cultures in the AM 5. Follow up sensitivities  Principal Problem:   Intra-abdominal abscess (HCC) Active Problems:   ESRD (end stage renal disease) on dialysis (HCC)   Elevated LFTs   Sepsis ([place])   Acute respiratory failure with hypoxia (HCC)   Gallbladder abscess  . insulin aspart  0-9 Units Subcutaneous Q4H   HPI: Edgar Brewer is a 58 y.o. male with a past medical history significant for ESRD (HD MWF), chronic diastolic CHF, Z6WF (U9N 7.3), GERD, HTN who presented to the hospital on 10/21 with fever, abdominal pain, nausea, vomiting and malaise. Of note, patient was recently admitted for similar chief complaint from 9/24 through 9/28.  At that time, he was found to have transaminitis. Abdominal imaging significant for intra-abdominal fluid collection seroma versus biloma versus abscess. This is in the context of a recent laparoscopic cholecystectomy on 9/10. GI and general surgery were consulted at that time and the patient was treated with empiric antibiotics. HIDA scan at that time was negative for bile leak.  For this current admission, the patient initially presented with a fever of 103.1 F. He  was also tachycardic and tachypneic and had low oxygen saturations to 88% on room air.  Labs did not show a leukocytosis, but blood cultures grew Enterococcus species, E. coli and Klebsiella pneumonia.The patient is currently on Ceftriaxone, Ampicillin, and flagyl.   MRCP was obtained yesterday and significant for a small amount of gas and fluid in the gallbladder fossa, suspicious for small abscess.   Review of Systems: All systems have been reviewed and are otherwise negative unless mentioned in the HPI.  Past Medical History:  Diagnosis Date  . Acute on chronic combined systolic and diastolic CHF, NYHA class 1 ([place]) 01/13/2015  . Arthritis   . Chronic kidney disease    MWF [name].  . Diabetes mellitus type 2 with complications ([place])    Previous insulin, non-insulin requiring noted 09/2018.  Complications include retinopathy, peripheral neuropathy.  Marland Kitchen GERD (gastroesophageal reflux disease)    at times  . Head injury with loss of consciousness ([place])   . History of kidney stones   . Hypertension   . Neuropathy   . Pulmonary hypertension (HCC)    PA peak pressure 41 mmHg 03/04/16 echo  . Varicose veins of lower extremities with complications 23/55/7322   Social History   Tobacco Use  . Smoking status: Former Smoker    Packs/day: 1.00    Years: 45.00    Pack years: 45.00    Types: Cigarettes    Quit date: 01/13/2015    Years since quitting: 3.8  . Smokeless tobacco: Never Used  Substance Use Topics  .  Alcohol use: Yes    Alcohol/week: 1.0 standard drinks    Types: 1 Glasses of wine per week    Comment: rare occassion  . Drug use: No   Family History  Problem Relation Age of Onset  . Cancer Father    Allergies  Allergen Reactions  . Codeine Anaphylaxis, Hives, Swelling and Other (See Comments)    Swelling all over body and the throat   OBJECTIVE: Blood pressure (!) 107/57, pulse 91, temperature 99.4 F (37.4 C), temperature source Oral, resp. rate (!) 26, SpO2 96 %.   Physical Exam  Lab Results Lab Results  Component Value Date   WBC 7.7 11/14/2018   HGB 10.6 (L) 11/14/2018   HCT 34.2 (L) 11/14/2018   MCV 92.9 11/14/2018   PLT 190 11/14/2018    Lab Results  Component Value Date   CREATININE 4.43 (H) 11/14/2018   BUN 32 (H) 11/14/2018   NA 133 (L) 11/14/2018   K 3.9 11/14/2018   CL 95 (L) 11/14/2018   CO2 26 11/14/2018    Lab Results  Component Value Date   ALT 187 (H) 11/14/2018   AST 169 (H) 11/14/2018   ALKPHOS 1,114 (H) 11/14/2018   BILITOT 7.0 (H) 11/14/2018     Microbiology: Recent Results (from the past 240 hour(s))  Culture, blood (Routine x 2)     Status: None (Preliminary result)   Collection Time: 11/14/18  5:03 PM   Specimen: BLOOD  Result Value Ref Range Status   Specimen Description BLOOD RIGHT ANTECUBITAL  Final   Special Requests   Final    BOTTLES DRAWN AEROBIC AND ANAEROBIC Blood Culture results may not be optimal due to an inadequate volume of blood received in culture bottles   Culture  Setup Time   Final    ANAEROBIC BOTTLE ONLY GRAM NEGATIVE RODS GRAM POSITIVE COCCI Organism ID to follow AEROBIC BOTTLE ONLY GRAM NEGATIVE RODS GRAM POSITIVE COCCI CRITICAL RESULT CALLED TO, READ BACK BY AND VERIFIED WITH: G ABBOTT PHARMD 11/15/18 0601 JDW    Culture   Final    CULTURE REINCUBATED FOR BETTER GROWTH Performed at [facility], [place] [address], [place], [place] [address]    Report Status PENDING  Incomplete  Blood Culture ID Panel (Reflexed)     Status: Abnormal   Collection Time: 11/14/18  5:03 PM  Result Value Ref Range Status   Enterococcus species DETECTED (A) NOT DETECTED Final    Comment: CRITICAL RESULT CALLED TO, READ BACK BY AND VERIFIED WITH: G ABBOTT PHARMD 11/15/18 0601 JDW    Vancomycin resistance NOT DETECTED NOT DETECTED Final   Listeria monocytogenes NOT DETECTED NOT DETECTED Final   Staphylococcus species NOT DETECTED NOT DETECTED Final   Staphylococcus aureus (BCID) NOT DETECTED NOT  DETECTED Final   Streptococcus species NOT DETECTED NOT DETECTED Final   Streptococcus agalactiae NOT DETECTED NOT DETECTED Final   Streptococcus pneumoniae NOT DETECTED NOT DETECTED Final   Streptococcus pyogenes NOT DETECTED NOT DETECTED Final   Acinetobacter baumannii NOT DETECTED NOT DETECTED Final   Enterobacteriaceae species DETECTED (A) NOT DETECTED Final    Comment: CRITICAL RESULT CALLED TO, READ BACK BY AND VERIFIED WITH: G ABBOTT PHARMD 11/15/18 0601 JDW    Enterobacter cloacae complex NOT DETECTED NOT DETECTED Final   Escherichia coli DETECTED (A) NOT DETECTED Final    Comment: CRITICAL RESULT CALLED TO, READ BACK BY AND VERIFIED WITH: G ABBOTT PHARMD 11/15/18 0601 JDW    Klebsiella oxytoca NOT DETECTED NOT  DETECTED Final   Klebsiella pneumoniae DETECTED (A) NOT DETECTED Final    Comment: CRITICAL RESULT CALLED TO, READ BACK BY AND VERIFIED WITH: G ABBOTT PHARMD 11/15/18 0601 JDW    Proteus species NOT DETECTED NOT DETECTED Final   Serratia marcescens NOT DETECTED NOT DETECTED Final   Carbapenem resistance NOT DETECTED NOT DETECTED Final   Haemophilus influenzae NOT DETECTED NOT DETECTED Final   Neisseria meningitidis NOT DETECTED NOT DETECTED Final   Pseudomonas aeruginosa NOT DETECTED NOT DETECTED Final   Candida albicans NOT DETECTED NOT DETECTED Final   Candida glabrata NOT DETECTED NOT DETECTED Final   Candida krusei NOT DETECTED NOT DETECTED Final   Candida parapsilosis NOT DETECTED NOT DETECTED Final   Candida tropicalis NOT DETECTED NOT DETECTED Final    Comment: Performed at [facility], [address] [place], [place] [address]  Culture, blood (Routine x 2)     Status: None (Preliminary result)   Collection Time: 11/14/18  6:40 PM   Specimen: BLOOD RIGHT FOREARM  Result Value Ref Range Status   Specimen Description BLOOD RIGHT FOREARM  Final   Special Requests   Final    BOTTLES DRAWN AEROBIC AND ANAEROBIC Blood Culture results may not be optimal  due to an inadequate volume of blood received in culture bottles   Culture  Setup Time   Final    IN BOTH AEROBIC AND ANAEROBIC BOTTLES GRAM POSITIVE COCCI GRAM NEGATIVE RODS CRITICAL VALUE NOTED.  VALUE IS CONSISTENT WITH PREVIOUSLY REPORTED AND CALLED VALUE.    Culture   Final    CULTURE REINCUBATED FOR BETTER GROWTH Performed at [facility], [place] [address], [place], [place] [address]    Report Status PENDING  Incomplete  SARS CORONAVIRUS 2 (TAT 6-24 HRS) Nasopharyngeal Nasopharyngeal Swab     Status: None   Collection Time: 11/14/18 10:33 PM   Specimen: Nasopharyngeal Swab  Result Value Ref Range Status   SARS Coronavirus 2 NEGATIVE NEGATIVE Final    Comment: (NOTE) SARS-CoV-2 target nucleic acids are NOT DETECTED. The SARS-CoV-2 RNA is generally detectable in upper and lower respiratory specimens during the acute phase of infection. Negative results do not preclude SARS-CoV-2 infection, do not rule out co-infections with other pathogens, and should not be used as the sole basis for treatment or other patient management decisions. Negative results must be combined with clinical observations, patient history, and epidemiological information. The expected result is Negative. Fact Sheet for Patients: SugarRoll.be Fact Sheet for Healthcare Providers: https://www.woods-mathews.com/ This test is not yet approved or cleared by the [place] FDA and  has been authorized for detection and/or diagnosis of SARS-CoV-2 by FDA under an Emergency Use Authorization (EUA). This EUA will remain  in effect (meaning this test can be used) for the duration of the COVID-19 declaration under Section 56 4(b)(1) of the Act, 21 U.S.C. section 360bbb-3(b)(1), unless the authorization is terminated or revoked sooner. Performed at [facility], [place] [address], [place], [place] [address]    Earlene Plater, MD Internal Medicine, PGY1 Pager:  [phone]  11/15/2018,8:45 AM

## 2018-11-15 NOTE — Progress Notes (Signed)
PROGRESS NOTE    Edgar Brewer  SWF:[id number] DOB: 29-May-1960 DOA: 11/14/2018 PCP: Edgar Moment, MD      Brief Narrative:  [name] is a 58 y.o. M with ESRD on HD, DM, HTN, dCHF, pHTN, and recent choledocholithiasis with lap chole on 10/04/18 who presented with 1 day new confusion and fever and RUQ pain.  Patient had presented 3 weeks prior to this admission, 1 week post-op with N/V and confusion, found to have a 3cm fluid collection adjacent to liver.  HIDA negative for leak, Gen Surg consulted, treated conservatively with empiric antibiotics and this was thought to be an uninfected post-op seroma.  Since discharge, has had persistent nausea, vomiting, malaise, no real change.  Now fatigue worse, confused, feverish.  In the ER, temp 103F, tachycardic, encphealopathic, SpO2 88%.  AST 187, Tbili 7.0.  CT shows 4x3x3 cm abscess.  Overnight cultures growing Klebsiella and enterococci and E coli in blood.        Assessment & Plan:  Sepsis from infected post-chole seroma vs cholangitis causing Klebsiella, Enteroccocal and E coli bacteremia Presented with AMS and acute hypoxic respiratory failure from sepsis with tachycardia and fever.   Clinically, he has new icterus, Bili 7, fever and RUQ pain.  MRCP showed hemosiderosis, no CBD stone, no pancreatic or GB mass.  CT chest shows patchy opacity, I favor atelectasis.  Cultures overnight growing multiple species.  Fever and Lactate improved, hemodynamically improved.  Mentation still poor. -Continue Ceftriaxone and Unasyn -Consult ID, apprecaite cares -Consult IR, appreciate expertise    Hemosiderosis Elevated ferritin Cholestatic LFTs pattern Ferritin elevation had been noted previously.  MRCP shows hemosiderosis.  Synthetic funtion near normal: albumin, INR near normal.  Ammonia only minimally elevated. 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   Actually had neuropsych testing for dementia as part of renal transplant work up, that mother says was normal.  Currently he is somnolent, I feel this is from sepsis, out of proportion to the mild degree of hyperammonemia... -Monitor  ESRD -Consult Nephrology, appreciate cares -Defer Renvela, cinacalcet, Hectorol to Nephrology  Hypertension Chronic diastolic CHF Pulmonary hypertension EF normal in October this year. BP soft overnight.  Not on home antihypertensives  Diabetes Glucoses elevated overnight but no insulin corrections given yet -Hold gabapentin and tradjenta -Continue SSI corrections  Anemia of CKD Hgb stable relative to recent baseline        MDM and disposition: The below labs and imaging reports were reviewed and summarized above.  Medication management as above.  This is a svere illness with threat to life  The patient was admitted with sepsis from likely cholangitis.  Also with some encephalopathy and new liver disease.         DVT prophylaxis: SCDs Code Status: FULL Family Communication: Mother    Consultants:   IR  Gen Surg  GI  Procedures:   10/21 CT abdomen    Antimicrobials:   Ampicillin 10/22 >>  Ceftriaxone 10/22 >>  Flagyl 10/22 >>    Culture data:   10/21 blood culture x2 --   Subjective: Has mild RUQ pain.  He is sleepy, otherwise no complaints.  Fever gone.  No vomiting.  No distension, no diarrhea.  He is confused.  Objective: Vitals:   11/15/18 1150 11/15/18 1202 11/15/18 1232 11/15/18 1614  BP: 121/64 (!) 105/56 (!) 106/54   Pulse: 92 84 82   Resp: 18 14 16    Temp: 98.9  F (37.2 C)   98.3 F (36.8 C)  TempSrc: Oral   Oral  SpO2: 97%       Intake/Output Summary (Last 24 hours) at 11/15/2018 1709 Last data filed at 11/15/2018 0740 Gross per 24 hour  Intake 1200 ml  Output --  Net 1200 ml   Filed Weights    Examination: General appearance:  adult male, alert and in no acute distress.  Appears  stuporous HEENT: icteric, conjunctiva pink, lids and lashes normal. No nasal deformity, discharge, epistaxis.  Lips moist, dentition normal, OP moist, no oral lesions, hearing normal, no cervical or supraclavicular lymphadenopathy, no thyromegaly or neck masses.   Skin: Warm and dry.  Jaundice.  No suspicious rashes or lesions.  No stigmata of chronic liver disease Cardiac: RRR, nl S1-S2, no murmurs appreciated.  Capillary refill is brisk.  JVP normal.  No LE edema.  Radial pulses 2+ and symmetric. Respiratory: Normal respiratory rate and rhythm.  CTAB without rales or wheezes. Abdomen: Abdomen soft.  Mild RUQ TTP to deep palpation, no guarding. No ascites, distension, hepatosplenomegaly.   MSK: No deformities or effusions. Normal muscel bulk and tone. Neuro: Awake but sleepy, confused.  No nystagmus.  EOMI, moves all extremities with slowed coordination but normal strength. Speech fluent.    Psych: Sensorium intact and responding to questions, attention diminihsed, psychomotor slowing noted. Affect blunted  Judgment and insight appear impaired.    Data Reviewed: I have personally reviewed following labs and imaging studies:  CBC: Recent Labs  Lab 11/14/18 1702 11/15/18 1210  WBC 7.7 12.1*  NEUTROABS 7.4  --   HGB 10.6* 9.2*  HCT 34.2* 29.3*  MCV 92.9 92.7  PLT 190 272*   Basic Metabolic Panel: Recent Labs  Lab 11/14/18 1702  NA 133*  K 3.9  CL 95*  CO2 26  GLUCOSE 323*  BUN 32*  CREATININE 4.43*  CALCIUM 8.7*   GFR: CrCl cannot be calculated (Unknown ideal weight.). Liver Function Tests: Recent Labs  Lab 11/14/18 1702  AST 169*  ALT 187*  ALKPHOS 1,114*  BILITOT 7.0*  PROT 6.6  ALBUMIN 3.1*   No results for input(s): LIPASE, AMYLASE in the last 168 hours. Recent Labs  Lab 11/15/18 0242  AMMONIA 38*   Coagulation Profile: Recent Labs  Lab 11/14/18 1702  INR 1.0   Cardiac Enzymes: No results for input(s): CKTOTAL, CKMB, CKMBINDEX, TROPONINI in the last  168 hours. BNP (last 3 results) No results for input(s): PROBNP in the last 8760 hours. HbA1C: Recent Labs    11/15/18 0242  HGBA1C 7.3*   CBG: Recent Labs  Lab 11/15/18 0111 11/15/18 0501 11/15/18 0901 11/15/18 1612  GLUCAP 243* 247* 225* 113*   Lipid Profile: No results for input(s): CHOL, HDL, LDLCALC, TRIG, CHOLHDL, LDLDIRECT in the last 72 hours. Thyroid Function Tests: No results for input(s): TSH, T4TOTAL, FREET4, T3FREE, THYROIDAB in the last 72 hours. Anemia Panel: No results for input(s): VITAMINB12, FOLATE, FERRITIN, TIBC, IRON, RETICCTPCT in the last 72 hours. Urine analysis:    Component Value Date/Time   COLORURINE AMBER (A) 10/19/2018 0501   APPEARANCEUR CLEAR 10/19/2018 0501   LABSPEC 1.028 10/19/2018 0501   PHURINE 6.0 10/19/2018 0501   GLUCOSEU >=500 (A) 10/19/2018 0501   HGBUR SMALL (A) 10/19/2018 0501   BILIRUBINUR NEGATIVE 10/19/2018 0501   KETONESUR NEGATIVE 10/19/2018 0501   PROTEINUR >=300 (A) 10/19/2018 0501   NITRITE NEGATIVE 10/19/2018 0501   LEUKOCYTESUR NEGATIVE 10/19/2018 0501   Sepsis Labs: @LABRCNTIP (procalcitonin:4,lacticacidven:4)  )  Recent Results (from the past 240 hour(s))  Culture, blood (Routine x 2)     Status: None (Preliminary result)   Collection Time: 11/14/18  5:03 PM   Specimen: BLOOD  Result Value Ref Range Status   Specimen Description BLOOD RIGHT ANTECUBITAL  Final   Special Requests   Final    BOTTLES DRAWN AEROBIC AND ANAEROBIC Blood Culture results may not be optimal due to an inadequate volume of blood received in culture bottles   Culture  Setup Time   Final    ANAEROBIC BOTTLE ONLY GRAM NEGATIVE RODS GRAM POSITIVE COCCI Organism ID to follow AEROBIC BOTTLE ONLY GRAM NEGATIVE RODS GRAM POSITIVE COCCI CRITICAL RESULT CALLED TO, READ BACK BY AND VERIFIED WITH: G ABBOTT PHARMD 11/15/18 0601 JDW    Culture   Final    CULTURE REINCUBATED FOR BETTER GROWTH Performed at [facility], [place] [address],  [place], [place] [address]    Report Status PENDING  Incomplete  Blood Culture ID Panel (Reflexed)     Status: Abnormal   Collection Time: 11/14/18  5:03 PM  Result Value Ref Range Status   Enterococcus species DETECTED (A) NOT DETECTED Final    Comment: CRITICAL RESULT CALLED TO, READ BACK BY AND VERIFIED WITH: G ABBOTT PHARMD 11/15/18 0601 JDW    Vancomycin resistance NOT DETECTED NOT DETECTED Final   Listeria monocytogenes NOT DETECTED NOT DETECTED Final   Staphylococcus species NOT DETECTED NOT DETECTED Final   Staphylococcus aureus (BCID) NOT DETECTED NOT DETECTED Final   Streptococcus species NOT DETECTED NOT DETECTED Final   Streptococcus agalactiae NOT DETECTED NOT DETECTED Final   Streptococcus pneumoniae NOT DETECTED NOT DETECTED Final   Streptococcus pyogenes NOT DETECTED NOT DETECTED Final   Acinetobacter baumannii NOT DETECTED NOT DETECTED Final   Enterobacteriaceae species DETECTED (A) NOT DETECTED Final    Comment: CRITICAL RESULT CALLED TO, READ BACK BY AND VERIFIED WITH: G ABBOTT PHARMD 11/15/18 0601 JDW    Enterobacter cloacae complex NOT DETECTED NOT DETECTED Final   Escherichia coli DETECTED (A) NOT DETECTED Final    Comment: CRITICAL RESULT CALLED TO, READ BACK BY AND VERIFIED WITH: G ABBOTT PHARMD 11/15/18 0601 JDW    Klebsiella oxytoca NOT DETECTED NOT DETECTED Final   Klebsiella pneumoniae DETECTED (A) NOT DETECTED Final    Comment: CRITICAL RESULT CALLED TO, READ BACK BY AND VERIFIED WITH: G ABBOTT PHARMD 11/15/18 0601 JDW    Proteus species NOT DETECTED NOT DETECTED Final   Serratia marcescens NOT DETECTED NOT DETECTED Final   Carbapenem resistance NOT DETECTED NOT DETECTED Final   Haemophilus influenzae NOT DETECTED NOT DETECTED Final   Neisseria meningitidis NOT DETECTED NOT DETECTED Final   Pseudomonas aeruginosa NOT DETECTED NOT DETECTED Final   Candida albicans NOT DETECTED NOT DETECTED Final   Candida glabrata NOT DETECTED NOT DETECTED Final    Candida krusei NOT DETECTED NOT DETECTED Final   Candida parapsilosis NOT DETECTED NOT DETECTED Final   Candida tropicalis NOT DETECTED NOT DETECTED Final    Comment: Performed at [facility], [address] [place], [place] [address]  Culture, blood (Routine x 2)     Status: None (Preliminary result)   Collection Time: 11/14/18  6:40 PM   Specimen: BLOOD RIGHT FOREARM  Result Value Ref Range Status   Specimen Description BLOOD RIGHT FOREARM  Final   Special Requests   Final    BOTTLES DRAWN AEROBIC AND ANAEROBIC Blood Culture results may not be optimal due to an  inadequate volume of blood received in culture bottles   Culture  Setup Time   Final    IN BOTH AEROBIC AND ANAEROBIC BOTTLES GRAM POSITIVE COCCI GRAM NEGATIVE RODS CRITICAL VALUE NOTED.  VALUE IS CONSISTENT WITH PREVIOUSLY REPORTED AND CALLED VALUE.    Culture   Final    CULTURE REINCUBATED FOR BETTER GROWTH Performed at [facility], [place] [address] [place], [place] [address]    Report Status PENDING  Incomplete  SARS CORONAVIRUS 2 (TAT 6-24 HRS) Nasopharyngeal Nasopharyngeal Swab     Status: None   Collection Time: 11/14/18 10:33 PM   Specimen: Nasopharyngeal Swab  Result Value Ref Range Status   SARS Coronavirus 2 NEGATIVE NEGATIVE Final    Comment: (NOTE) SARS-CoV-2 target nucleic acids are NOT DETECTED. The SARS-CoV-2 RNA is generally detectable in upper and lower respiratory specimens during the acute phase of infection. Negative results do not preclude SARS-CoV-2 infection, do not rule out co-infections with other pathogens, and should not be used as the sole basis for treatment or other patient management decisions. Negative results must be combined with clinical observations, patient history, and epidemiological information. The expected result is Negative. Fact Sheet for Patients: SugarRoll.be Fact Sheet for Healthcare  Providers: https://www.woods-mathews.com/ This test is not yet approved or cleared by the [place] FDA and  has been authorized for detection and/or diagnosis of SARS-CoV-2 by FDA under an Emergency Use Authorization (EUA). This EUA will remain  in effect (meaning this test can be used) for the duration of the COVID-19 declaration under Section 56 4(b)(1) of the Act, 21 U.S.C. section 360bbb-3(b)(1), unless the authorization is terminated or revoked sooner. Performed at [facility], [place] [address], [place],  [address]          Radiology Studies: Ct Chest Wo Contrast  Result Date: 11/15/2018 CLINICAL DATA:  Shortness of breath, fever EXAM: CT CHEST WITHOUT CONTRAST TECHNIQUE: Multidetector CT imaging of the chest was performed following the standard protocol without IV contrast. COMPARISON:  None. FINDINGS: Cardiovascular: There are no significant vascular findings. There is a minimal pericardial effusion seen. Coronary artery calcifications are noted. Scattered aortic atherosclerotic calcifications are seen without aneurysmal dilatation. Mediastinum/Nodes: There are no enlarged mediastinal, hilar or axillary lymph nodes. The thyroid gland, trachea and esophagus demonstrate no significant findings. Lungs/Pleura: Patchy/streaky area of airspace opacities are seen at the posterior bilateral lower lungs. There are small amount of air bronchogram seen within this region, left greater than right. No pneumothorax or pleural effusion. Upper abdomen: Partially visualized low-density collection with foci of air within the gallbladder bed. A small hiatal hernia is present. Musculoskeletal/Chest wall: There is no chest wall mass or suspicious osseous finding. No acute osseous abnormality IMPRESSION: 1. Mild patchy airspace disease in the posterior lower lungs, left greater than right. This likely represents atelectasis, less likely atypical infectious etiology. 2. Trace  pericardial effusion 3.  Aortic Atherosclerosis (ICD10-I70.0). Electronically Signed   By: Prudencio Pair M.D.   On: 11/15/2018 01:07   Ct Abdomen Pelvis W Contrast  Addendum Date: 11/14/2018   ADDENDUM REPORT: 11/14/2018 23:28 ADDENDUM: The mild biliary ductal dilatation reported in the findings, appears to be new since the prior exam. There is also a new mild common bile duct dilatation measuring up to 0.2 cm with mild surrounding fat stranding changes. This could be due to choledocholithiasis. Electronically Signed   By: Prudencio Pair M.D.   On: 11/14/2018 23:28   Result Date: 11/14/2018 CLINICAL DATA:  Nausea vomiting abdominal pain, fever EXAM: CT ABDOMEN AND PELVIS WITH CONTRAST TECHNIQUE: Multidetector CT imaging of the abdomen and pelvis was performed using the standard protocol following bolus administration of intravenous contrast. CONTRAST:  156mL OMNIPAQUE IOHEXOL 300 MG/ML  SOLN COMPARISON:  October 18, 2018 FINDINGS: Lower chest: The visualized heart size within normal limits. No pericardial fluid/thickening. No hiatal hernia. There is patchy/streaky airspace opacity at both lung bases, slightly increased from the prior exam. Hepatobiliary: Within the gallbladder bed there is a 4.1 x 2.8 x 2.7 cm low-density collection now slightly increased in size from the prior exam with a small foci of air. There is mild intrahepatic biliary ductal dilatation. There does appear to be some fat stranding changes seen within the gallbladder bed. No other focal lesions seen.The main portal vein is patent. The patient is status post cholecystectomy. Pancreas: Unremarkable. No pancreatic ductal dilatation or surrounding inflammatory changes. Spleen: Normal in size without focal abnormality. Adrenals/Urinary Tract: Both adrenal glands appear normal. The kidneys and collecting system appear normal without evidence of urinary tract calculus or hydronephrosis. Bladder is unremarkable. Again noted are bilateral  perinephric stranding. Stomach/Bowel: The stomach, small bowel, and colon are normal in appearance. No inflammatory changes, wall thickening, or obstructive findings.The appendix is normal. Vascular/Lymphatic: There are no enlarged mesenteric, retroperitoneal, or pelvic lymph nodes. Scattered aortic atherosclerotic calcifications are seen without aneurysmal dilatation. Reproductive: The prostate is unremarkable. Other: No evidence of abdominal wall mass or hernia. Musculoskeletal: No acute or significant osseous findings. IMPRESSION: 1. Interval slight increase in the fluid collection within the gallbladder bed now with a small foci of air, measuring 4.1 x 2.8 x 2.7 cm. Given the patient's history this could represent a infected collection within the liver/gallbladder bed. 2. Slight interval increase in the patchy/streaky airspace opacities at both lung bases which could represent atelectasis and/or atypical infectious etiology. Electronically Signed: By: Prudencio Pair M.D. On: 11/14/2018 20:10   Mr Abdomen Mrcp Wo Contrast  Result Date: 11/15/2018 CLINICAL DATA:  Jaundice, abdominal pain. Elevated liver function tests, nausea, and vomiting. Laparoscopic cholecystectomy on 10/04/2018. Mild biliary dilatation on CT of 11/14/2018, query choledocholithiasis. EXAM: MRI ABDOMEN WITHOUT CONTRAST  (INCLUDING MRCP) TECHNIQUE: Multiplanar multisequence MR imaging of the abdomen was performed. Heavily T2-weighted images of the biliary and pancreatic ducts were obtained, and three-dimensional MRCP images were rendered by post processing. COMPARISON:  11/14/2018 FINDINGS: Lower chest: Mild airspace opacities in the posterior basal segments of both lower lobes. Mild cardiomegaly. Hepatobiliary: Periportal edema. Small amount of gas and fluid in the gallbladder fossa at the site of cholecystectomy. This gas is abnormal 12 days out from surgery. Mild reduction of hepatic parenchymal signal on inphase images compared to out of  phase images, along with reduced T2 signal in the spleen, suspicious for hemosiderosis. Currently there is no biliary dilatation. Pancreas:  Pancreas divisum. No findings of acute pancreatitis. Spleen: Less than expected T2 signal and less than expected in phase signal in the spleen, suspicious for hemosiderosis. Adrenals/Urinary Tract: 4 mm fluid signal intensity lesion of the left mid kidney on image 29/4, likely a cyst but too small to characterize. The adrenal glands appear normal. No hydronephrosis. Minimal perirenal stranding bilaterally similar to recent CT appearance. Stomach/Bowel: Diverticulum of the transverse duodenum. Numerous scattered colonic diverticula. Vascular/Lymphatic: Aortoiliac atherosclerotic vascular disease. No pathologic adenopathy identified. Other:  No supplemental non-categorized findings. Musculoskeletal: There is evidence of lumbar degenerative disc disease. IMPRESSION: 1. Small amount of gas and fluid in the gallbladder fossa at  the site of cholecystectomy. This gas is abnormal 12 days out from surgery, and the possibility of a small abscess along the gallbladder fossa is raised. 2. No current biliary dilatation. There is nonspecific periportal edema. 3. 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 4. Pancreas divisum. 5. Mild airspace opacities in the posterior basal segments of both lower lobes. 6. Mild cardiomegaly. 7. Colonic diverticulosis. 8.  Aortic Atherosclerosis (ICD10-I70.0). Electronically Signed   By: Van Clines M.D.   On: 11/15/2018 08:58   Dg Chest Port 1 View  Result Date: 11/14/2018 CLINICAL DATA:  Sepsis EXAM: PORTABLE CHEST 1 VIEW COMPARISON:  July 05, 2016 FINDINGS: The heart size and mediastinal contours is unchanged. There is patchy airspace opacity/blunting seen at the left lung base. Pulmonary vascular congestion is seen. The visualized skeletal structures are unremarkable. IMPRESSION: Pulmonary vascular congestion. Patchy  airspace opacity with blunting seen at the left costophrenic angle which could represent small effusion, atelectasis, and/or early infectious etiology. Electronically Signed   By: Prudencio Pair M.D.   On: 11/14/2018 19:05        Scheduled Meds:  Chlorhexidine Gluconate Cloth  6 each Topical Q0600   [START ON 11/16/2018] darbepoetin (ARANESP) injection - DIALYSIS  60 mcg Intravenous Q Fri-HD   heparin       insulin aspart  0-9 Units Subcutaneous Q4H   lactulose  30 g Oral Once   Continuous Infusions:  sodium chloride     sodium chloride     ampicillin-sulbactam (UNASYN) IV       LOS: 1 day    Time spent: 35 minutse    Edwin Dada, MD [facility] 11/15/2018, 5:09 PM     Please page through [email or web address]:  [email or web address] Password [email or web address] If 7PM-7AM, please contact night-coverage

## 2018-11-15 NOTE — Consult Note (Addendum)
[facility] Renal Consultation Note    Indication for Consultation:  Management of ESRD/hemodialysis; anemia, hypertension/volume and secondary hyperparathyroidism  [id number], Tamika, MD  HPI: Edgar Brewer is a 58 y.o. male. 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  Past medical history significant for DMT2, HTN, systolic & diastolic HF G/LO75-64%, HTN, GERD and mild dementia.  Patient is a poor historian.  Majority of history obtained from chart review.  Brought to the ED by family for evaluation of fever, abdominal pain, vomiting and weakness.  Recent [facility] admit 9/24-9/29 d/t n/v and elevated LFTs.  CT showed intra abdominal fluid collection, thought to be a seroma although biloma or abscess where not ruled out. HIDA scan negative for bile leak, patient began improving clinically and was discharged home.    Seen and examined at bedside.  Admits to vomiting and R sided abdominal pain.  Denies SOB, LE edema, CP and edema.  Pertinent findings in the ED include fever, tmax 103.1, +tachycarida, +tachypnea w/O2 sat 88% on RA, neg COVID, BC +enterococcus, E coli and klebsiella pneumoniae and MRCP showing gas and fluid collection in gallbladder fossa suspicious for possible small abscess at cholecystectomy site.  Patient has been admitted for further evaluation and treatment.   Past Medical History:  Diagnosis Date  . Acute on chronic combined systolic and diastolic CHF, NYHA class 1 ([place]) 01/13/2015  . Arthritis   . Chronic kidney disease    MWF [name].  . Diabetes mellitus type 2 with complications ([place])    Previous insulin, non-insulin requiring noted 09/2018.  Complications include retinopathy, peripheral neuropathy.  Marland Kitchen GERD (gastroesophageal reflux disease)    at times  . Head injury with loss of consciousness ([place])   . History of kidney stones   . Hypertension   . Neuropathy   . Pulmonary hypertension (HCC)    PA peak pressure 41 mmHg 03/04/16 echo  .  Varicose veins of lower extremities with complications 33/29/5188   Past Surgical History:  Procedure Laterality Date  . AV FISTULA PLACEMENT Left 06/01/2016   Procedure: ARTERIOVENOUS (AV) FISTULA CREATION;  Surgeon: [name], MD;  Location: [place];  Service: Vascular;  Laterality: Left;  . AV FISTULA PLACEMENT Left 08/10/2017   Procedure: BRACHIOCEPHALIC ARTERIOVENOUS (AV) FISTULA CREATION LEFT UPPER EXTREMITY;  Surgeon: [name], MD;  Location: [place];  Service: Vascular;  Laterality: Left;  . CHOLECYSTECTOMY N/A 10/04/2018   Procedure: LAPAROSCOPIC CHOLECYSTECTOMY;  Surgeon: Kinsinger, Arta Bruce, MD;  Location: [place];  Service: General;  Laterality: N/A;  . COLONOSCOPY W/ POLYPECTOMY  03/2016   For evaluation of iron deficiency anemia.  Dr. Silverio Decamp.  10 polyps, largest 20 mm.  Pathology: tubular adenomas with no high-grade dysplasia, hyperplastic.  None bleeding internal hemorrhoids.  Pandiverticulosis.  Marland Kitchen ENDOVENOUS ABLATION SAPHENOUS VEIN W/ LASER Left 07-23-2015   endovenous laser ablation left greater saphenous vein by [name]  . ENDOVENOUS ABLATION SAPHENOUS VEIN W/ LASER Right 10/15/2015   EVLA right greater saphenous vein by [name]  . ESOPHAGOGASTRODUODENOSCOPY  03/2016   For evaluation of IDA.  Dr. Silverio Decamp.  Duodenal erythema, pathology benign mucosa, no villous atrophy.   Family History  Problem Relation Age of Onset  . Cancer Father    Social History:  reports that he quit smoking about 3 years ago. His smoking use included cigarettes. He has a 45.00 pack-year smoking history. He has never used smokeless tobacco. He reports current alcohol use  of about 1.0 standard drinks of alcohol per week. He reports that he does not use drugs. Allergies  Allergen Reactions  . Codeine Anaphylaxis, Hives, Swelling and Other (See Comments)    Swelling all over body and the throat   Prior to Admission medications   Medication Sig Start Date End Date Taking?  Authorizing Provider  b complex-vitamin c-folic acid (NEPHRO-VITE) 0.8 MG TABS tablet Take 1 tablet by mouth daily.   Yes [provider]  cinacalcet (SENSIPAR) 60 MG tablet Take 60 mg by mouth daily.   Yes [provider]  doxercalciferol (HECTOROL) 4 MCG/2ML injection Inject 5 mLs (10 mcg total) into the vein every Monday, Wednesday, and Friday with hemodialysis. 10/08/18  Yes Swayze, Ava, DO  gabapentin (NEURONTIN) 300 MG capsule Take 300 mg by mouth See admin instructions. Take 300 mg by mouth in the morning and 300 mg at bedtime   Yes [provider]  ibuprofen (ADVIL) 200 MG tablet Take 200-800 mg by mouth every 8 (eight) hours as needed for headache or mild pain.   Yes [provider]  linagliptin (TRADJENTA) 5 MG TABS tablet Take 5 mg by mouth daily.   Yes [provider]  omeprazole (PRILOSEC) 20 MG capsule Take 20 mg by mouth daily before breakfast.  11/25/17  Yes [provider]  ondansetron (ZOFRAN) 4 MG tablet Take 1 tablet (4 mg total) by mouth every 6 (six) hours as needed for nausea. Patient taking differently: Take 4 mg by mouth every 6 (six) hours as needed for nausea or vomiting.  10/22/18  Yes Aline August, MD  sevelamer carbonate (RENVELA) 800 MG tablet Take 2 tablets (1,600 mg total) by mouth 3 (three) times daily with meals. 10/22/18  Yes Aline August, MD  cinacalcet (SENSIPAR) 30 MG tablet Take 2 tablets (60 mg total) by mouth daily with supper. Patient not taking: Reported on 11/14/2018 10/06/18   Swayze, Ava, DO  traMADol (ULTRAM) 50 MG tablet Take 1 tablet (50 mg total) by mouth every 12 (twelve) hours as needed for moderate pain. Patient not taking: Reported on 11/14/2018 10/22/18   Aline August, MD   Current Facility-Administered Medications  Medication Dose Route Frequency Provider Last Rate Last Dose  . acetaminophen (TYLENOL) tablet 650 mg  650 mg Oral Q6H PRN Shela Leff, MD   650 mg at 11/14/18 2340   Or   . acetaminophen (TYLENOL) suppository 650 mg  650 mg Rectal Q6H PRN Shela Leff, MD      . ampicillin (OMNIPEN) 2 g in sodium chloride 0.9 % 100 mL IVPB  2 g Intravenous Q12H Shela Leff, MD   Stopped at 11/15/18 0920  . cefTRIAXone (ROCEPHIN) 2 g in sodium chloride 0.9 % 100 mL IVPB  2 g Intravenous Daily Shela Leff, MD 200 mL/hr at 11/15/18 0921 2 g at 11/15/18 0921  . insulin aspart (novoLOG) injection 0-9 Units  0-9 Units Subcutaneous Q4H Shela Leff, MD   3 Units at 11/15/18 [phone]  . metroNIDAZOLE (FLAGYL) IVPB 500 mg  500 mg Intravenous Quay Burow, MD   Stopped at 11/15/18 0740  . ondansetron (ZOFRAN) injection 4 mg  4 mg Intravenous Q6H PRN Shela Leff, MD       Current Outpatient Medications  Medication Sig Dispense Refill  . b complex-vitamin c-folic acid (NEPHRO-VITE) 0.8 MG TABS tablet Take 1 tablet by mouth daily.    . cinacalcet (SENSIPAR) 60 MG tablet Take 60 mg by mouth daily.    Marland Kitchen doxercalciferol (HECTOROL)  4 MCG/2ML injection Inject 5 mLs (10 mcg total) into the vein every Monday, Wednesday, and Friday with hemodialysis. 2 mL 0  . gabapentin (NEURONTIN) 300 MG capsule Take 300 mg by mouth See admin instructions. Take 300 mg by mouth in the morning and 300 mg at bedtime    . ibuprofen (ADVIL) 200 MG tablet Take 200-800 mg by mouth every 8 (eight) hours as needed for headache or mild pain.    Marland Kitchen linagliptin (TRADJENTA) 5 MG TABS tablet Take 5 mg by mouth daily.    Marland Kitchen omeprazole (PRILOSEC) 20 MG capsule Take 20 mg by mouth daily before breakfast.   2  . ondansetron (ZOFRAN) 4 MG tablet Take 1 tablet (4 mg total) by mouth every 6 (six) hours as needed for nausea. (Patient taking differently: Take 4 mg by mouth every 6 (six) hours as needed for nausea or vomiting. ) 20 tablet 0  . sevelamer carbonate (RENVELA) 800 MG tablet Take 2 tablets (1,600 mg total) by mouth 3 (three) times daily with meals.    . cinacalcet (SENSIPAR) 30 MG tablet  Take 2 tablets (60 mg total) by mouth daily with supper. (Patient not taking: Reported on 11/14/2018) 60 tablet 0  . traMADol (ULTRAM) 50 MG tablet Take 1 tablet (50 mg total) by mouth every 12 (twelve) hours as needed for moderate pain. (Patient not taking: Reported on 11/14/2018) 10 tablet 0   Labs: Basic Metabolic Panel: Recent Labs  Lab 11/08/18 1216 11/14/18 1702  NA 136 133*  K 3.5 3.9  CL 93* 95*  CO2 34* 26  GLUCOSE 247* 323*  BUN 18 32*  CREATININE 3.09* 4.43*  CALCIUM 8.7 8.7*   Liver Function Tests: Recent Labs  Lab 11/08/18 1216 11/14/18 1702  AST 47* 169*  ALT 102* 187*  ALKPHOS 1,087* 1,114*  BILITOT 5.1* 7.0*  PROT 6.6 6.6  ALBUMIN 3.5 3.1*    Recent Labs  Lab 11/15/18 0242  AMMONIA 38*   CBC: Recent Labs  Lab 11/08/18 1216 11/14/18 1702  WBC 4.7 7.7  NEUTROABS 3.6 7.4  HGB 10.4* 10.6*  HCT 30.8* 34.2*  MCV 88.0 92.9  PLT 240.0 190   CBG: Recent Labs  Lab 11/15/18 0111 11/15/18 0501 11/15/18 0901  GLUCAP 243* 247* 225*   Studies/Results: Ct Chest Wo Contrast  Result Date: 11/15/2018 CLINICAL DATA:  Shortness of breath, fever EXAM: CT CHEST WITHOUT CONTRAST TECHNIQUE: Multidetector CT imaging of the chest was performed following the standard protocol without IV contrast. COMPARISON:  None. FINDINGS: Cardiovascular: There are no significant vascular findings. There is a minimal pericardial effusion seen. Coronary artery calcifications are noted. Scattered aortic atherosclerotic calcifications are seen without aneurysmal dilatation. Mediastinum/Nodes: There are no enlarged mediastinal, hilar or axillary lymph nodes. The thyroid gland, trachea and esophagus demonstrate no significant findings. Lungs/Pleura: Patchy/streaky area of airspace opacities are seen at the posterior bilateral lower lungs. There are small amount of air bronchogram seen within this region, left greater than right. No pneumothorax or pleural effusion. Upper abdomen:  Partially visualized low-density collection with foci of air within the gallbladder bed. A small hiatal hernia is present. Musculoskeletal/Chest wall: There is no chest wall mass or suspicious osseous finding. No acute osseous abnormality IMPRESSION: 1. Mild patchy airspace disease in the posterior lower lungs, left greater than right. This likely represents atelectasis, less likely atypical infectious etiology. 2. Trace pericardial effusion 3.  Aortic Atherosclerosis (ICD10-I70.0). Electronically Signed   By: Prudencio Pair M.D.   On: 11/15/2018 01:07  Ct Abdomen Pelvis W Contrast  Addendum Date: 11/14/2018   ADDENDUM REPORT: 11/14/2018 23:28 ADDENDUM: The mild biliary ductal dilatation reported in the findings, appears to be new since the prior exam. There is also a new mild common bile duct dilatation measuring up to 0.2 cm with mild surrounding fat stranding changes. This could be due to choledocholithiasis. Electronically Signed   By: Prudencio Pair M.D.   On: 11/14/2018 23:28   Result Date: 11/14/2018 CLINICAL DATA:  Nausea vomiting abdominal pain, fever EXAM: CT ABDOMEN AND PELVIS WITH CONTRAST TECHNIQUE: Multidetector CT imaging of the abdomen and pelvis was performed using the standard protocol following bolus administration of intravenous contrast. CONTRAST:  161mL OMNIPAQUE IOHEXOL 300 MG/ML  SOLN COMPARISON:  October 18, 2018 FINDINGS: Lower chest: The visualized heart size within normal limits. No pericardial fluid/thickening. No hiatal hernia. There is patchy/streaky airspace opacity at both lung bases, slightly increased from the prior exam. Hepatobiliary: Within the gallbladder bed there is a 4.1 x 2.8 x 2.7 cm low-density collection now slightly increased in size from the prior exam with a small foci of air. There is mild intrahepatic biliary ductal dilatation. There does appear to be some fat stranding changes seen within the gallbladder bed. No other focal lesions seen.The main portal vein  is patent. The patient is status post cholecystectomy. Pancreas: Unremarkable. No pancreatic ductal dilatation or surrounding inflammatory changes. Spleen: Normal in size without focal abnormality. Adrenals/Urinary Tract: Both adrenal glands appear normal. The kidneys and collecting system appear normal without evidence of urinary tract calculus or hydronephrosis. Bladder is unremarkable. Again noted are bilateral perinephric stranding. Stomach/Bowel: The stomach, small bowel, and colon are normal in appearance. No inflammatory changes, wall thickening, or obstructive findings.The appendix is normal. Vascular/Lymphatic: There are no enlarged mesenteric, retroperitoneal, or pelvic lymph nodes. Scattered aortic atherosclerotic calcifications are seen without aneurysmal dilatation. Reproductive: The prostate is unremarkable. Other: No evidence of abdominal wall mass or hernia. Musculoskeletal: No acute or significant osseous findings. IMPRESSION: 1. Interval slight increase in the fluid collection within the gallbladder bed now with a small foci of air, measuring 4.1 x 2.8 x 2.7 cm. Given the patient's history this could represent a infected collection within the liver/gallbladder bed. 2. Slight interval increase in the patchy/streaky airspace opacities at both lung bases which could represent atelectasis and/or atypical infectious etiology. Electronically Signed: By: Prudencio Pair M.D. On: 11/14/2018 20:10   Mr Abdomen Mrcp Wo Contrast  Result Date: 11/15/2018 CLINICAL DATA:  Jaundice, abdominal pain. Elevated liver function tests, nausea, and vomiting. Laparoscopic cholecystectomy on 10/04/2018. Mild biliary dilatation on CT of 11/14/2018, query choledocholithiasis. EXAM: MRI ABDOMEN WITHOUT CONTRAST  (INCLUDING MRCP) TECHNIQUE: Multiplanar multisequence MR imaging of the abdomen was performed. Heavily T2-weighted images of the biliary and pancreatic ducts were obtained, and three-dimensional MRCP images were  rendered by post processing. COMPARISON:  11/14/2018 FINDINGS: Lower chest: Mild airspace opacities in the posterior basal segments of both lower lobes. Mild cardiomegaly. Hepatobiliary: Periportal edema. Small amount of gas and fluid in the gallbladder fossa at the site of cholecystectomy. This gas is abnormal 12 days out from surgery. Mild reduction of hepatic parenchymal signal on inphase images compared to out of phase images, along with reduced T2 signal in the spleen, suspicious for hemosiderosis. Currently there is no biliary dilatation. Pancreas:  Pancreas divisum. No findings of acute pancreatitis. Spleen: Less than expected T2 signal and less than expected in phase signal in the spleen, suspicious for hemosiderosis. Adrenals/Urinary Tract: 4 mm fluid  signal intensity lesion of the left mid kidney on image 29/4, likely a cyst but too small to characterize. The adrenal glands appear normal. No hydronephrosis. Minimal perirenal stranding bilaterally similar to recent CT appearance. Stomach/Bowel: Diverticulum of the transverse duodenum. Numerous scattered colonic diverticula. Vascular/Lymphatic: Aortoiliac atherosclerotic vascular disease. No pathologic adenopathy identified. Other:  No supplemental non-categorized findings. Musculoskeletal: There is evidence of lumbar degenerative disc disease. IMPRESSION: 1. Small amount of gas and fluid in the gallbladder fossa at the site of cholecystectomy. This gas is abnormal 12 days out from surgery, and the possibility of a small abscess along the gallbladder fossa is raised. 2. No current biliary dilatation. There is nonspecific periportal edema. 3. 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 4. Pancreas divisum. 5. Mild airspace opacities in the posterior basal segments of both lower lobes. 6. Mild cardiomegaly. 7. Colonic diverticulosis. 8.  Aortic Atherosclerosis (ICD10-I70.0). Electronically Signed   By: Van Clines M.D.   On:  11/15/2018 08:58   Dg Chest Port 1 View  Result Date: 11/14/2018 CLINICAL DATA:  Sepsis EXAM: PORTABLE CHEST 1 VIEW COMPARISON:  July 05, 2016 FINDINGS: The heart size and mediastinal contours is unchanged. There is patchy airspace opacity/blunting seen at the left lung base. Pulmonary vascular congestion is seen. The visualized skeletal structures are unremarkable. IMPRESSION: Pulmonary vascular congestion. Patchy airspace opacity with blunting seen at the left costophrenic angle which could represent small effusion, atelectasis, and/or early infectious etiology. Electronically Signed   By: Prudencio Pair M.D.   On: 11/14/2018 19:05    ROS: All others negative except those listed in HPI.  Physical Exam: Vitals:   11/15/18 0530 11/15/18 0545 11/15/18 0600 11/15/18 0902  BP: (!) 108/58 (!) 99/57 (!) 107/57 (!) 106/52  Pulse: 94 92 91 87  Resp: (!) 27 (!) 28 (!) 26   Temp:      TempSrc:      SpO2: 96% 95% 96% 97%     General: WDWN NAD, chronically ill appearing male Head: NCAT mild sclera icterics MMM Neck: Supple. No lymphadenopathy, +JVD to  Angle of jaw Lungs: CTA bilaterally. Mild crackles on R. No wheeze or rhonchi. Breathing is unlabored. Heart: RRR. Heart sounds distant. No murmur, rubs or gallops.  Abdomen: soft, mild RUQ tenderness, +BS, no guarding, no rebound tenderness Lower extremities:no edema, +discoloration LE, no open wounds  Neuro: AAOx3. Moves all extremities spontaneously, moderate myoclonic jerking of UE, slow to respond Psych:  Responds to questions appropriately with a normal affect. Dialysis Access: LU AVF +b  Dialysis Orders:  MWF - [facility]  4.25hrs, BFR 400, DFR 800,  EDW 85.5kg, 2K/ 2Ca  Access: LU AVF  Heparin 2600 unit bolus Mircera 30 mcg q4wks - last 9/30 Hectorol 9 mcg IV qHD  Assessment/Plan: 1. Polymicrobial Bacteremia - BC +enterococcus, E coli and klebsiella pneumonia. Febrile, tmax 103.1. ID following.  On Unasyn, waiting for sensitives. 2.   Abscess at gallbladder fossa - identified on MRCP. Per surgery no indication of need for surgical intervention, following.  Plan for IR to aspirate.  3. 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 Per primary.  4.  ESRD -  On HD MWF. Missed yesterday.  Plan for make up HD today and resume regular schedule tomorrow. K 3.9.  5.  Hypertension/volume  - BP soft, not on antihypertensives.  Does not appear volume overloaded.  Run even today.  6.  Anemia of CKD - Hgb 9.2. Plan for ESA w/HD tomorrow. No  iron due to possible hemosiderosis.  7.  Secondary Hyperparathyroidism -  Ca at goal. Phos in goal as OP.  Continue binders, VDRA and sensipar.  8.  Nutrition - Renal diet w/fluid restrictions once advanced. 9. DM - per primary  Jen Mow, PA-C [facility] Pager: [phone] 11/15/2018, 10:14 AM   Pt seen, examined and agree w assess/plan as above with additions as indicated. Pt admitted w/ abd pain/ ^LFT"s early Sept and underwent lap chole. Readmit mid-Sept for N/V and ^LFT's, fluid collection on imaging under liver , got better w/ abx and dc'd home.  Now here w/ bacteremia and abscess at GB fossa. Empiric IV abx started and no indication for surgery per consultants. Plan short HD today and regular HD tomorrow.  [facility] 11/15/2018, 4:48 PM

## 2018-11-15 NOTE — Progress Notes (Signed)
Pharmacy Antibiotic Note  Edgar Brewer is a 58 y.o. male who presents on 11/14/2018 with vomiting, generalized weakness, and fever of 101. Pt's O2 saturation is 88% on room air. There is concern for sepsis from an intra-abdominal source.  Pharmacy has been consulted for Unasyn dosing.  WBC has increased from 7.7 to 12.1. Lactate is slightly elevated at 1.5. Pt is now afebrile.  Plan: unasyn IV 3g q24 hours Watch HD schedule Monitor WBC, Temp, Lactate, clinical status F/U length of therapy  Weight: (unable to obtain pre wt. )  Temp (24hrs), Avg:101.4 F (38.6 C), Min:98.9 F (37.2 C), Max:103.1 F (39.5 C)  Recent Labs  Lab 11/14/18 1702 11/14/18 1703 11/14/18 1845 11/15/18 1210  WBC 7.7  --   --  12.1*  CREATININE 4.43*  --   --   --   LATICACIDVEN  --  2.1* 1.5  --     CrCl cannot be calculated (Unknown ideal weight.).    Allergies  Allergen Reactions  . Codeine Anaphylaxis, Hives, Swelling and Other (See Comments)    Swelling all over body and the throat    Antimicrobials this admission: metronidazole 10/21 >> 10/22 Cefepime 10/21 >> 10/22 Unasyn 10/22 >>  Microbiology results: 10/21 BCx: Ecoli and Klebsiella detected on BCID 10/21 UCx: Sent 10/21 COVID: negative 10/22 MRSA PCR: Sent  Thank you for allowing pharmacy to be a part of this patient's care.  Sherren Kerns, PharmD PGY1 Acute Care Pharmacy Resident 11/15/2018 3:51 PM

## 2018-11-15 NOTE — ED Notes (Signed)
Pt stated he doesn't make urine

## 2018-11-16 ENCOUNTER — Inpatient Hospital Stay (HOSPITAL_COMMUNITY): Payer: Medicare Other

## 2018-11-16 LAB — CBC
HCT: 28.1 % — ABNORMAL LOW (ref 39.0–52.0)
Hemoglobin: 8.9 g/dL — ABNORMAL LOW (ref 13.0–17.0)
MCH: 29.3 pg (ref 26.0–34.0)
MCHC: 31.7 g/dL (ref 30.0–36.0)
MCV: 92.4 fL (ref 80.0–100.0)
Platelets: 107 10*3/uL — ABNORMAL LOW (ref 150–400)
RBC: 3.04 MIL/uL — ABNORMAL LOW (ref 4.22–5.81)
RDW: 16.2 % — ABNORMAL HIGH (ref 11.5–15.5)
WBC: 8.1 10*3/uL (ref 4.0–10.5)
nRBC: 0 % (ref 0.0–0.2)

## 2018-11-16 LAB — GLUCOSE, CAPILLARY
Glucose-Capillary: 110 mg/dL — ABNORMAL HIGH (ref 70–99)
Glucose-Capillary: 116 mg/dL — ABNORMAL HIGH (ref 70–99)
Glucose-Capillary: 121 mg/dL — ABNORMAL HIGH (ref 70–99)
Glucose-Capillary: 124 mg/dL — ABNORMAL HIGH (ref 70–99)
Glucose-Capillary: 132 mg/dL — ABNORMAL HIGH (ref 70–99)
Glucose-Capillary: 149 mg/dL — ABNORMAL HIGH (ref 70–99)
Glucose-Capillary: 91 mg/dL (ref 70–99)

## 2018-11-16 LAB — COMPREHENSIVE METABOLIC PANEL
ALT: 131 U/L — ABNORMAL HIGH (ref 0–44)
AST: 81 U/L — ABNORMAL HIGH (ref 15–41)
Albumin: 2.3 g/dL — ABNORMAL LOW (ref 3.5–5.0)
Alkaline Phosphatase: 746 U/L — ABNORMAL HIGH (ref 38–126)
Anion gap: 13 (ref 5–15)
BUN: 30 mg/dL — ABNORMAL HIGH (ref 6–20)
CO2: 25 mmol/L (ref 22–32)
Calcium: 8.5 mg/dL — ABNORMAL LOW (ref 8.9–10.3)
Chloride: 97 mmol/L — ABNORMAL LOW (ref 98–111)
Creatinine, Ser: 4.58 mg/dL — ABNORMAL HIGH (ref 0.61–1.24)
GFR calc Af Amer: 15 mL/min — ABNORMAL LOW (ref >60)
GFR calc non Af Amer: 13 mL/min — ABNORMAL LOW (ref >60)
Glucose, Bld: 109 mg/dL — ABNORMAL HIGH (ref 70–99)
Potassium: 3.9 mmol/L (ref 3.5–5.1)
Sodium: 135 mmol/L (ref 135–145)
Total Bilirubin: 6.5 mg/dL — ABNORMAL HIGH (ref 0.3–1.2)
Total Protein: 5.9 g/dL — ABNORMAL LOW (ref 6.5–8.1)

## 2018-11-16 MED ORDER — DOXERCALCIFEROL 4 MCG/2ML IV SOLN
9.0000 ug | INTRAVENOUS | Status: DC
  Filled 2018-11-16: qty 6

## 2018-11-16 MED ORDER — SODIUM CHLORIDE 0.9 % IV SOLN: 100.0000 mL | INTRAVENOUS | Status: DC | PRN

## 2018-11-16 MED ORDER — PRO-STAT SUGAR FREE PO LIQD
30.0000 mL | Freq: Two times a day (BID) | ORAL | Status: DC
  Administered 2018-11-16 – 2018-11-20 (×5): 30 mL via ORAL
  Filled 2018-11-16 (×7): qty 30

## 2018-11-16 MED ORDER — MIDAZOLAM HCL 2 MG/2ML IJ SOLN
INTRAMUSCULAR | Status: AC | PRN
  Administered 2018-11-16: 1 mg via INTRAVENOUS

## 2018-11-16 MED ORDER — RENA-VITE PO TABS
1.0000 | ORAL_TABLET | Freq: Every day | ORAL | Status: DC
  Administered 2018-11-16 – 2018-11-19 (×4): 1 via ORAL
  Filled 2018-11-16 (×4): qty 1

## 2018-11-16 MED ORDER — CHLORHEXIDINE GLUCONATE CLOTH 2 % EX PADS
6.0000 | MEDICATED_PAD | Freq: Every day | CUTANEOUS | Status: DC
  Administered 2018-11-17 – 2018-11-20 (×4): 6 via TOPICAL

## 2018-11-16 MED ORDER — LIDOCAINE HCL (PF) 1 % IJ SOLN
5.0000 mL | INTRAMUSCULAR | Status: DC | PRN
  Filled 2018-11-16 (×2): qty 5

## 2018-11-16 MED ORDER — INSULIN ASPART 100 UNIT/ML ~~LOC~~ SOLN
0.0000 [IU] | Freq: Three times a day (TID) | SUBCUTANEOUS | Status: DC
  Administered 2018-11-17: 1 [IU] via SUBCUTANEOUS
  Administered 2018-11-17 – 2018-11-18 (×4): 2 [IU] via SUBCUTANEOUS
  Administered 2018-11-18: 3 [IU] via SUBCUTANEOUS
  Administered 2018-11-19 – 2018-11-20 (×3): 1 [IU] via SUBCUTANEOUS

## 2018-11-16 MED ORDER — SODIUM CHLORIDE 0.9 % IV SOLN
INTRAVENOUS | Status: DC | PRN
  Administered 2018-11-16: 21:00:00 250 mL via INTRAVENOUS
  Administered 2018-11-17: 22:00:00 via INTRAVENOUS

## 2018-11-16 MED ORDER — DARBEPOETIN ALFA 60 MCG/0.3ML IJ SOSY
PREFILLED_SYRINGE | INTRAMUSCULAR | Status: AC
  Filled 2018-11-16: qty 0.3

## 2018-11-16 MED ORDER — HEPARIN SODIUM (PORCINE) 1000 UNIT/ML DIALYSIS: 1000.0000 [IU] | INTRAMUSCULAR | Status: DC | PRN

## 2018-11-16 MED ORDER — ALTEPLASE 2 MG IJ SOLR: 2.0000 mg | Freq: Once | INTRAMUSCULAR | Status: DC | PRN

## 2018-11-16 MED ORDER — PENTAFLUOROPROP-TETRAFLUOROETH EX AERO: 1.0000 "application " | INHALATION_SPRAY | CUTANEOUS | Status: DC | PRN

## 2018-11-16 MED ORDER — CINACALCET HCL 30 MG PO TABS
60.0000 mg | ORAL_TABLET | Freq: Every day | ORAL | Status: DC
  Administered 2018-11-16 – 2018-11-20 (×5): 60 mg via ORAL
  Filled 2018-11-16 (×5): qty 2

## 2018-11-16 MED ORDER — FENTANYL CITRATE (PF) 100 MCG/2ML IJ SOLN
INTRAMUSCULAR | Status: AC
  Filled 2018-11-16: qty 2

## 2018-11-16 MED ORDER — SODIUM CHLORIDE 0.9% FLUSH
5.0000 mL | Freq: Three times a day (TID) | INTRAVENOUS | Status: DC
  Administered 2018-11-16 – 2018-11-20 (×12): 5 mL

## 2018-11-16 MED ORDER — FENTANYL CITRATE (PF) 100 MCG/2ML IJ SOLN
INTRAMUSCULAR | Status: AC | PRN
  Administered 2018-11-16: 25 ug via INTRAVENOUS

## 2018-11-16 MED ORDER — LIDOCAINE HCL 1 % IJ SOLN
INTRAMUSCULAR | Status: AC
  Filled 2018-11-16: qty 20

## 2018-11-16 MED ORDER — HEPARIN SODIUM (PORCINE) 1000 UNIT/ML DIALYSIS: 2600.0000 [IU] | INTRAMUSCULAR | Status: DC | PRN

## 2018-11-16 MED ORDER — MIDAZOLAM HCL 2 MG/2ML IJ SOLN
INTRAMUSCULAR | Status: AC
  Filled 2018-11-16: qty 2

## 2018-11-16 MED ORDER — LIDOCAINE-PRILOCAINE 2.5-2.5 % EX CREA: 1.0000 "application " | TOPICAL_CREAM | CUTANEOUS | Status: DC | PRN

## 2018-11-16 NOTE — Progress Notes (Signed)
Patient ID: Edgar Brewer, male   DOB: [birth year], 59 y.o.   MRN: [id number]       Subjective: On the phone.  Just got back from IR  Objective: Vital signs in last 24 hours: Temp:  [98.3 F (36.8 C)-99 F (37.2 C)] 98.4 F (36.9 C) (10/23 0735) Pulse Rate:  [82-97] 96 (10/23 0850) Resp:  [14-23] 20 (10/23 0850) BP: (97-157)/(43-83) 146/72 (10/23 0850) SpO2:  [92 %-100 %] 98 % (10/23 0850) Last BM Date: 11/16/18  Intake/Output from previous day: 10/22 0701 - 10/23 0700 In: 278.7 [P.O.:120; IV Piggyback:158.7] Out: 298  Intake/Output this shift: No intake/output data recorded.  PE: Abd: soft, seems minimally tender around drain as expected, +BS, ND, JP drain with minimal serosang output.  Apparently about 5cc of purulent fluid was removed at time of placement  Lab Results:  Recent Labs    11/15/18 1210 11/16/18 0245  WBC 12.1* 8.1  HGB 9.2* 8.9*  HCT 29.3* 28.1*  PLT 104* 107*   BMET Recent Labs    11/14/18 1702 11/16/18 0245  NA 133* 135  K 3.9 3.9  CL 95* 97*  CO2 26 25  GLUCOSE 323* 109*  BUN 32* 30*  CREATININE 4.43* 4.58*  CALCIUM 8.7* 8.5*   PT/INR Recent Labs    11/14/18 1702  LABPROT 12.7  INR 1.0   CMP     Component Value Date/Time   NA 135 11/16/2018 0245   NA 140 03/04/2016 1317   K 3.9 11/16/2018 0245   CL 97 (L) 11/16/2018 0245   CO2 25 11/16/2018 0245   GLUCOSE 109 (H) 11/16/2018 0245   BUN 30 (H) 11/16/2018 0245   BUN 47 (H) 03/04/2016 1317   CREATININE 4.58 (H) 11/16/2018 0245   CALCIUM 8.5 (L) 11/16/2018 0245   PROT 5.9 (L) 11/16/2018 0245   PROT 5.2 (L) 02/29/2016 1439   ALBUMIN 2.3 (L) 11/16/2018 0245   ALBUMIN 3.1 (L) 02/29/2016 1439   AST 81 (H) 11/16/2018 0245   ALT 131 (H) 11/16/2018 0245   ALKPHOS 746 (H) 11/16/2018 0245   BILITOT 6.5 (H) 11/16/2018 0245   BILITOT 0.2 02/29/2016 1439   GFRNONAA 13 (L) 11/16/2018 0245   GFRAA 15 (L) 11/16/2018 0245   Lipase     Component Value Date/Time   LIPASE 45 10/18/2018  2358       Studies/Results: Ct Chest Wo Contrast  Result Date: 11/15/2018 CLINICAL DATA:  Shortness of breath, fever EXAM: CT CHEST WITHOUT CONTRAST TECHNIQUE: Multidetector CT imaging of the chest was performed following the standard protocol without IV contrast. COMPARISON:  None. FINDINGS: Cardiovascular: There are no significant vascular findings. There is a minimal pericardial effusion seen. Coronary artery calcifications are noted. Scattered aortic atherosclerotic calcifications are seen without aneurysmal dilatation. Mediastinum/Nodes: There are no enlarged mediastinal, hilar or axillary lymph nodes. The thyroid gland, trachea and esophagus demonstrate no significant findings. Lungs/Pleura: Patchy/streaky area of airspace opacities are seen at the posterior bilateral lower lungs. There are small amount of air bronchogram seen within this region, left greater than right. No pneumothorax or pleural effusion. Upper abdomen: Partially visualized low-density collection with foci of air within the gallbladder bed. A small hiatal hernia is present. Musculoskeletal/Chest wall: There is no chest wall mass or suspicious osseous finding. No acute osseous abnormality IMPRESSION: 1. Mild patchy airspace disease in the posterior lower lungs, left greater than right. This likely represents atelectasis, less likely atypical infectious etiology. 2. Trace pericardial effusion 3.  Aortic Atherosclerosis (ICD10-I70.0). Electronically  Signed   By: Prudencio Pair M.D.   On: 11/15/2018 01:07   Ct Abdomen Pelvis W Contrast  Addendum Date: 11/14/2018   ADDENDUM REPORT: 11/14/2018 23:28 ADDENDUM: The mild biliary ductal dilatation reported in the findings, appears to be new since the prior exam. There is also a new mild common bile duct dilatation measuring up to 0.2 cm with mild surrounding fat stranding changes. This could be due to choledocholithiasis. Electronically Signed   By: Prudencio Pair M.D.   On: 11/14/2018  23:28   Result Date: 11/14/2018 CLINICAL DATA:  Nausea vomiting abdominal pain, fever EXAM: CT ABDOMEN AND PELVIS WITH CONTRAST TECHNIQUE: Multidetector CT imaging of the abdomen and pelvis was performed using the standard protocol following bolus administration of intravenous contrast. CONTRAST:  110mL OMNIPAQUE IOHEXOL 300 MG/ML  SOLN COMPARISON:  October 18, 2018 FINDINGS: Lower chest: The visualized heart size within normal limits. No pericardial fluid/thickening. No hiatal hernia. There is patchy/streaky airspace opacity at both lung bases, slightly increased from the prior exam. Hepatobiliary: Within the gallbladder bed there is a 4.1 x 2.8 x 2.7 cm low-density collection now slightly increased in size from the prior exam with a small foci of air. There is mild intrahepatic biliary ductal dilatation. There does appear to be some fat stranding changes seen within the gallbladder bed. No other focal lesions seen.The main portal vein is patent. The patient is status post cholecystectomy. Pancreas: Unremarkable. No pancreatic ductal dilatation or surrounding inflammatory changes. Spleen: Normal in size without focal abnormality. Adrenals/Urinary Tract: Both adrenal glands appear normal. The kidneys and collecting system appear normal without evidence of urinary tract calculus or hydronephrosis. Bladder is unremarkable. Again noted are bilateral perinephric stranding. Stomach/Bowel: The stomach, small bowel, and colon are normal in appearance. No inflammatory changes, wall thickening, or obstructive findings.The appendix is normal. Vascular/Lymphatic: There are no enlarged mesenteric, retroperitoneal, or pelvic lymph nodes. Scattered aortic atherosclerotic calcifications are seen without aneurysmal dilatation. Reproductive: The prostate is unremarkable. Other: No evidence of abdominal wall mass or hernia. Musculoskeletal: No acute or significant osseous findings. IMPRESSION: 1. Interval slight increase in the  fluid collection within the gallbladder bed now with a small foci of air, measuring 4.1 x 2.8 x 2.7 cm. Given the patient's history this could represent a infected collection within the liver/gallbladder bed. 2. Slight interval increase in the patchy/streaky airspace opacities at both lung bases which could represent atelectasis and/or atypical infectious etiology. Electronically Signed: By: Prudencio Pair M.D. On: 11/14/2018 20:10   Mr Abdomen Mrcp Wo Contrast  Result Date: 11/15/2018 CLINICAL DATA:  Jaundice, abdominal pain. Elevated liver function tests, nausea, and vomiting. Laparoscopic cholecystectomy on 10/04/2018. Mild biliary dilatation on CT of 11/14/2018, query choledocholithiasis. EXAM: MRI ABDOMEN WITHOUT CONTRAST  (INCLUDING MRCP) TECHNIQUE: Multiplanar multisequence MR imaging of the abdomen was performed. Heavily T2-weighted images of the biliary and pancreatic ducts were obtained, and three-dimensional MRCP images were rendered by post processing. COMPARISON:  11/14/2018 FINDINGS: Lower chest: Mild airspace opacities in the posterior basal segments of both lower lobes. Mild cardiomegaly. Hepatobiliary: Periportal edema. Small amount of gas and fluid in the gallbladder fossa at the site of cholecystectomy. This gas is abnormal 12 days out from surgery. Mild reduction of hepatic parenchymal signal on inphase images compared to out of phase images, along with reduced T2 signal in the spleen, suspicious for hemosiderosis. Currently there is no biliary dilatation. Pancreas:  Pancreas divisum. No findings of acute pancreatitis. Spleen: Less than expected T2 signal and less than  expected in phase signal in the spleen, suspicious for hemosiderosis. Adrenals/Urinary Tract: 4 mm fluid signal intensity lesion of the left mid kidney on image 29/4, likely a cyst but too small to characterize. The adrenal glands appear normal. No hydronephrosis. Minimal perirenal stranding bilaterally similar to recent CT  appearance. Stomach/Bowel: Diverticulum of the transverse duodenum. Numerous scattered colonic diverticula. Vascular/Lymphatic: Aortoiliac atherosclerotic vascular disease. No pathologic adenopathy identified. Other:  No supplemental non-categorized findings. Musculoskeletal: There is evidence of lumbar degenerative disc disease. IMPRESSION: 1. Small amount of gas and fluid in the gallbladder fossa at the site of cholecystectomy. This gas is abnormal 12 days out from surgery, and the possibility of a small abscess along the gallbladder fossa is raised. 2. No current biliary dilatation. There is nonspecific periportal edema. 3. 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 4. Pancreas divisum. 5. Mild airspace opacities in the posterior basal segments of both lower lobes. 6. Mild cardiomegaly. 7. Colonic diverticulosis. 8.  Aortic Atherosclerosis (ICD10-I70.0). Electronically Signed   By: Van Clines M.D.   On: 11/15/2018 08:58   Dg Chest Port 1 View  Result Date: 11/14/2018 CLINICAL DATA:  Sepsis EXAM: PORTABLE CHEST 1 VIEW COMPARISON:  July 05, 2016 FINDINGS: The heart size and mediastinal contours is unchanged. There is patchy airspace opacity/blunting seen at the left lung base. Pulmonary vascular congestion is seen. The visualized skeletal structures are unremarkable. IMPRESSION: Pulmonary vascular congestion. Patchy airspace opacity with blunting seen at the left costophrenic angle which could represent small effusion, atelectasis, and/or early infectious etiology. Electronically Signed   By: Prudencio Pair M.D.   On: 11/14/2018 19:05    Anti-infectives: Anti-infectives (From admission, onward)   Start     Dose/Rate Route Frequency Ordered Stop   11/16/18 1000  cefTRIAXone (ROCEPHIN) 2 g in sodium chloride 0.9 % 100 mL IVPB     2 g 200 mL/hr over 30 Minutes Intravenous Every 24 hours 11/15/18 1711     11/15/18 2000  Ampicillin-Sulbactam (UNASYN) 3 g in sodium chloride  0.9 % 100 mL IVPB     3 g 200 mL/hr over 30 Minutes Intravenous Every 24 hours 11/15/18 1527     11/15/18 0800  cefTRIAXone (ROCEPHIN) 2 g in sodium chloride 0.9 % 100 mL IVPB  Status:  Discontinued     2 g 200 mL/hr over 30 Minutes Intravenous Daily 11/15/18 0625 11/15/18 1509   11/15/18 0700  ampicillin (OMNIPEN) 2 g in sodium chloride 0.9 % 100 mL IVPB  Status:  Discontinued     2 g 300 mL/hr over 20 Minutes Intravenous Every 12 hours 11/15/18 0625 11/15/18 1509   11/15/18 0600  metroNIDAZOLE (FLAGYL) IVPB 500 mg  Status:  Discontinued     500 mg 100 mL/hr over 60 Minutes Intravenous Every 8 hours 11/14/18 2308 11/15/18 1509   11/14/18 1845  ceFEPIme (MAXIPIME) 2 g in sodium chloride 0.9 % 100 mL IVPB  Status:  Discontinued     2 g 200 mL/hr over 30 Minutes Intravenous  Once 11/14/18 1837 11/14/18 1841   11/14/18 1845  metroNIDAZOLE (FLAGYL) IVPB 500 mg     500 mg 100 mL/hr over 60 Minutes Intravenous  Once 11/14/18 1837 11/14/18 2040   11/14/18 1845  ceFEPIme (MAXIPIME) 2 g in sodium chloride 0.9 % 100 mL IVPB  Status:  Discontinued     2 g 200 mL/hr over 30 Minutes Intravenous Every 24 hours 11/14/18 1841 11/14/18 1842   11/14/18 1845  ceFEPIme (MAXIPIME) 1  g in sodium chloride 0.9 % 100 mL IVPB  Status:  Discontinued     1 g 200 mL/hr over 30 Minutes Intravenous Every 24 hours 11/14/18 1842 11/15/18 8335       Assessment/Plan Polymicrobial bacteremia - per medicine MMP - per medicine/renal, etc 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 Kieth Brightly with small gallbladder fossa fluid collection -s/p perc drain today by IR. -cont drain and abx therapy, hopefully won't need drain long at all -may have diet from our standpoint  FEN - ok for diet from our standpoint VTE - heparin ID - Rocephin/Unasyn Follow up - [facility]/IR drain clinic   LOS: 2 days    [name] , [facility] 11/16/2018, 9:12 AM Please see Amion for  pager number during day hours 7:00am-4:30pm

## 2018-11-16 NOTE — Progress Notes (Signed)
Patient back from dialysis; alert and oriented, no acute distress noted, no complaints. VS stable. Will continue to monitor.

## 2018-11-16 NOTE — Progress Notes (Signed)
PROGRESS NOTE    Edgar Brewer  PYP:[id number] DOB: [birth year] DOA: 11/14/2018 PCP: Edgar Moment, MD      Brief Narrative:  Edgar Brewer is a 58 y.o. M with ESRD on HD, DM, HTN, dCHF, pHTN, and recent choledocholithiasis with lap chole on 10/04/18 who presented with 1 day new confusion and fever and RUQ pain.  Patient had presented 3 weeks prior to this admission, 1 week post-op with N/V and confusion, found to have a 3cm fluid collection adjacent to liver.  HIDA negative for leak, Gen Surg consulted, treated conservatively with empiric antibiotics and this was thought to be an uninfected post-op seroma.  Since discharge, has had persistent nausea, vomiting, malaise, no real change.  Now fatigue worse, confused, feverish.  In the ER, temp 103F, tachycardic, encphealopathic, SpO2 88%.  AST 187, Tbili 7.0.  CT shows 4x3x3 cm abscess.  Overnight cultures growing Klebsiella and enterococci and E coli in blood.        Assessment & Plan:  Sepsis from infected post-chole seroma vs cholangitis causing Klebsiella, Enteroccocal and E coli bacteremia Presented with AMS and acute hypoxic respiratory failure from sepsis with tachycardia and fever.   Clinically, he has new icterus, Bili 7, fever and RUQ pain.  MRCP showed hemosiderosis, no CBD stone, no pancreatic or GB mass.  CT chest shows patchy opacity, I favor atelectasis.  Cultures polymicrobial enterobacteriaceae and enterococcus.  Sepsis physiology resolved.   -Obtain Echo -Continue CTX, Unasyn -Await sensitivities -Consult IR, appreciate expertise    Hemosiderosis Elevated ferritin Cholestatic LFTs pattern Ferritin elevation had been noted previously.  MRCP shows hemosiderosis.  Synthetic funtion near normal: albumin, INR near normal.  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  Actually had neuropsych testing for dementia as part of renal transplant work up, that mother says was normal.  He appears now near baseline, per mother. -Monitor  ESRD -Consult Nephrology, appreciate cares -Defer Renvela, cinacalcet, Hectorol to Nephrology  Hypertension Chronic diastolic CHF Pulmonary hypertension EF normal in October this year. BP elevated.  Not on home antihypertensives   Diabetes Glucoses well controlled now -Hold gabapentin and tradjenta -Continue SSI corrections  Anemia of CKD Hgb stable relative to recent baseline        MDM and disposition: The below labs and imaging reports reviewed and summarized above.  Medication management as above.  This is a severe infeciton with threat to life and bodily function.  The patient was admitted with sepsis from likely cholangitis.  Also with some encephalopathy and new liver disease.         DVT prophylaxis: SCDs Code Status: FULL Family Communication:      Consultants:   IR  Gen Surg  ID  Procedures:   10/21 CT abdomen  10/24 echo pending  Antimicrobials:   Ampicillin 10/22 >> 10/23  Ceftriaxone 10/22 >>  Flagyl 10/22 >> 10/23  Unasyn 10/22 >>   Culture data:   10/21 blood culture x2 --   10/23 GB foss aspirate culture --   Subjective: Fever resolved, mild right upper quadrant pain, soreness at the site of the drain.  No confusion, vomiting.  No cough, dyspnea.  Objective: Vitals:   11/16/18 1530 11/16/18 1600 11/16/18 1624 11/16/18 1701  BP: (!) 142/74 (!) 151/73 (!) 150/73 (!) 141/68  Pulse: 89 90 90 95  Resp:   16 20  Temp:  97.9 F (36.6 C) 98.8 F (37.1 C)  TempSrc:   Oral Oral  SpO2:   92% 92%  Weight:   86.8 kg     Intake/Output Summary (Last 24 hours) at 11/16/2018 1716 Last data filed at 11/16/2018 1624 Gross per 24 hour  Intake 278.66 ml  Output 1500 ml  Net -1221.34 ml   Filed Weights   11/16/18 1215 11/16/18 1624  Weight: 88.2 kg  86.8 kg    Examination: General appearance: Adult male, sitting in dialysis chair, no acute distress, interactive, mentation slowed. HEENT: Anicteric, conjunctival pink, lids and lashes normal.  No nasal deformity, discharge, or epistaxis.  Lips moist, dentition normal, oropharynx moist, no oral lesions, hearing normal,  skin: Jaundice, no suspicious rashes, no stigmata of chronic liver disease Cardiac: Tachycardic, regular, no murmurs, JVP normal, no lower extremity edema Respiratory: Normal respiratory rate and rhythm, lungs clear without rales or wheezes Abdomen: No percutaneous drain in place, abdomen soft, right upper quadrant tenderness around perc  Drain, no hepatosplenomegaly. MSK: No deformities or effusions of large joints of the upper or lower extremities bilaterally.  Normal muscle bulk and tone.   Neuro: Psychomotor slowing noted, but extraocular movements intact, moves all extremities with normal strength and coordination, speech fluent.    Psych: Intact sensorium to person, place, and situation.  Attention slightly diminished, psychomotor slowing noted, affect flat.   Data Reviewed: I have personally reviewed following labs and imaging studies:  CBC: Recent Labs  Lab 11/14/18 1702 11/15/18 1210 11/16/18 0245  WBC 7.7 12.1* 8.1  NEUTROABS 7.4  --   --   HGB 10.6* 9.2* 8.9*  HCT 34.2* 29.3* 28.1*  MCV 92.9 92.7 92.4  PLT 190 104* 253*   Basic Metabolic Panel: Recent Labs  Lab 11/14/18 1702 11/16/18 0245  NA 133* 135  K 3.9 3.9  CL 95* 97*  CO2 26 25  GLUCOSE 323* 109*  BUN 32* 30*  CREATININE 4.43* 4.58*  CALCIUM 8.7* 8.5*   GFR: Estimated Creatinine Clearance: 18.6 mL/min (A) (by C-G formula based on SCr of 4.58 mg/dL (H)). Liver Function Tests: Recent Labs  Lab 11/14/18 1702 11/16/18 0245  AST 169* 81*  ALT 187* 131*  ALKPHOS 1,114* 746*  BILITOT 7.0* 6.5*  PROT 6.6 5.9*  ALBUMIN 3.1* 2.3*   No results for input(s): LIPASE, AMYLASE in the last  168 hours. Recent Labs  Lab 11/15/18 0242  AMMONIA 38*   Coagulation Profile: Recent Labs  Lab 11/14/18 1702  INR 1.0   Cardiac Enzymes: No results for input(s): CKTOTAL, CKMB, CKMBINDEX, TROPONINI in the last 168 hours. BNP (last 3 results) No results for input(s): PROBNP in the last 8760 hours. HbA1C: Recent Labs    11/15/18 0242  HGBA1C 7.3*   CBG: Recent Labs  Lab 11/15/18 2150 11/16/18 0008 11/16/18 0423 11/16/18 0741 11/16/18 1200  GLUCAP 163* 116* 110* 121* 132*   Lipid Profile: No results for input(s): CHOL, HDL, LDLCALC, TRIG, CHOLHDL, LDLDIRECT in the last 72 hours. Thyroid Function Tests: No results for input(s): TSH, T4TOTAL, FREET4, T3FREE, THYROIDAB in the last 72 hours. Anemia Panel: No results for input(s): VITAMINB12, FOLATE, FERRITIN, TIBC, IRON, RETICCTPCT in the last 72 hours. Urine analysis:    Component Value Date/Time   COLORURINE AMBER (A) 10/19/2018 0501   APPEARANCEUR CLEAR 10/19/2018 0501   LABSPEC 1.028 10/19/2018 0501   PHURINE 6.0 10/19/2018 0501   GLUCOSEU >=500 (A) 10/19/2018 0501   HGBUR SMALL (A) 10/19/2018 0501   BILIRUBINUR NEGATIVE  10/19/2018 0501   KETONESUR NEGATIVE 10/19/2018 0501   PROTEINUR >=300 (A) 10/19/2018 0501   NITRITE NEGATIVE 10/19/2018 0501   LEUKOCYTESUR NEGATIVE 10/19/2018 0501   Sepsis Labs: @LABRCNTIP (procalcitonin:4,lacticacidven:4)  ) Recent Results (from the past 240 hour(s))  Culture, blood (Routine x 2)     Status: None (Preliminary result)   Collection Time: 11/14/18  5:03 PM   Specimen: BLOOD  Result Value Ref Range Status   Specimen Description BLOOD RIGHT ANTECUBITAL  Final   Special Requests   Final    BOTTLES DRAWN AEROBIC AND ANAEROBIC Blood Culture results may not be optimal due to an inadequate volume of blood received in culture bottles   Culture  Setup Time   Final    ANAEROBIC BOTTLE ONLY GRAM NEGATIVE RODS GRAM POSITIVE COCCI Organism ID to follow AEROBIC BOTTLE ONLY GRAM  NEGATIVE RODS GRAM POSITIVE COCCI CRITICAL RESULT CALLED TO, READ BACK BY AND VERIFIED WITH: G ABBOTT PHARMD 11/15/18 0601 JDW    Culture   Final    CULTURE REINCUBATED FOR BETTER GROWTH Performed at [facility], [place] [address] [place], [place] [address]    Report Status PENDING  Incomplete  Blood Culture ID Panel (Reflexed)     Status: Abnormal   Collection Time: 11/14/18  5:03 PM  Result Value Ref Range Status   Enterococcus species DETECTED (A) NOT DETECTED Final    Comment: CRITICAL RESULT CALLED TO, READ BACK BY AND VERIFIED WITH: G ABBOTT PHARMD 11/15/18 0601 JDW    Vancomycin resistance NOT DETECTED NOT DETECTED Final   Listeria monocytogenes NOT DETECTED NOT DETECTED Final   Staphylococcus species NOT DETECTED NOT DETECTED Final   Staphylococcus aureus (BCID) NOT DETECTED NOT DETECTED Final   Streptococcus species NOT DETECTED NOT DETECTED Final   Streptococcus agalactiae NOT DETECTED NOT DETECTED Final   Streptococcus pneumoniae NOT DETECTED NOT DETECTED Final   Streptococcus pyogenes NOT DETECTED NOT DETECTED Final   Acinetobacter baumannii NOT DETECTED NOT DETECTED Final   Enterobacteriaceae species DETECTED (A) NOT DETECTED Final    Comment: CRITICAL RESULT CALLED TO, READ BACK BY AND VERIFIED WITH: G ABBOTT PHARMD 11/15/18 0601 JDW    Enterobacter cloacae complex NOT DETECTED NOT DETECTED Final   Escherichia coli DETECTED (A) NOT DETECTED Final    Comment: CRITICAL RESULT CALLED TO, READ BACK BY AND VERIFIED WITH: G ABBOTT PHARMD 11/15/18 0601 JDW    Klebsiella oxytoca NOT DETECTED NOT DETECTED Final   Klebsiella pneumoniae DETECTED (A) NOT DETECTED Final    Comment: CRITICAL RESULT CALLED TO, READ BACK BY AND VERIFIED WITH: G ABBOTT PHARMD 11/15/18 0601 JDW    Proteus species NOT DETECTED NOT DETECTED Final   Serratia marcescens NOT DETECTED NOT DETECTED Final   Carbapenem resistance NOT DETECTED NOT DETECTED Final   Haemophilus influenzae NOT DETECTED  NOT DETECTED Final   Neisseria meningitidis NOT DETECTED NOT DETECTED Final   Pseudomonas aeruginosa NOT DETECTED NOT DETECTED Final   Candida albicans NOT DETECTED NOT DETECTED Final   Candida glabrata NOT DETECTED NOT DETECTED Final   Candida krusei NOT DETECTED NOT DETECTED Final   Candida parapsilosis NOT DETECTED NOT DETECTED Final   Candida tropicalis NOT DETECTED NOT DETECTED Final    Comment: Performed at [facility], [address], [place], [place] [address]  Culture, blood (Routine x 2)     Status: None (Preliminary result)   Collection Time: 11/14/18  6:40 PM   Specimen: BLOOD RIGHT FOREARM  Result Value Ref Range Status  Specimen Description BLOOD RIGHT FOREARM  Final   Special Requests   Final    BOTTLES DRAWN AEROBIC AND ANAEROBIC Blood Culture results may not be optimal due to an inadequate volume of blood received in culture bottles   Culture  Setup Time   Final    IN BOTH AEROBIC AND ANAEROBIC BOTTLES GRAM POSITIVE COCCI GRAM NEGATIVE RODS CRITICAL VALUE NOTED.  VALUE IS CONSISTENT WITH PREVIOUSLY REPORTED AND CALLED VALUE.    Culture   Final    CULTURE REINCUBATED FOR BETTER GROWTH Performed at [facility], [place] [address], [place], [place] [address]    Report Status PENDING  Incomplete  SARS CORONAVIRUS 2 (TAT 6-24 HRS) Nasopharyngeal Nasopharyngeal Swab     Status: None   Collection Time: 11/14/18 10:33 PM   Specimen: Nasopharyngeal Swab  Result Value Ref Range Status   SARS Coronavirus 2 NEGATIVE NEGATIVE Final    Comment: (NOTE) SARS-CoV-2 target nucleic acids are NOT DETECTED. The SARS-CoV-2 RNA is generally detectable in upper and lower respiratory specimens during the acute phase of infection. Negative results do not preclude SARS-CoV-2 infection, do not rule out co-infections with other pathogens, and should not be used as the sole basis for treatment or other patient management decisions. Negative results must be combined with clinical  observations, patient history, and epidemiological information. The expected result is Negative. Fact Sheet for Patients: SugarRoll.be Fact Sheet for Healthcare Providers: https://www.woods-mathews.com/ This test is not yet approved or cleared by the [place] FDA and  has been authorized for detection and/or diagnosis of SARS-CoV-2 by FDA under an Emergency Use Authorization (EUA). This EUA will remain  in effect (meaning this test can be used) for the duration of the COVID-19 declaration under Section 56 4(b)(1) of the Act, 21 U.S.C. section 360bbb-3(b)(1), unless the authorization is terminated or revoked sooner. Performed at [facility], [place] [address] [place], [place] [address]   MRSA PCR Screening     Status: None   Collection Time: 11/15/18  3:57 PM   Specimen: Nasopharyngeal  Result Value Ref Range Status   MRSA by PCR NEGATIVE NEGATIVE Final    Comment:        The GeneXpert MRSA Assay (FDA approved for NASAL specimens only), is one component of a comprehensive MRSA colonization surveillance program. It is not intended to diagnose MRSA infection nor to guide or monitor treatment for MRSA infections. Performed at [facility], [place] [address] [place], [place] [address]   Culture, blood (routine x 2)     Status: None (Preliminary result)   Collection Time: 11/15/18  5:30 PM   Specimen: BLOOD  Result Value Ref Range Status   Specimen Description BLOOD RIGHT ANTECUBITAL  Final   Special Requests   Final    BOTTLES DRAWN AEROBIC ONLY Blood Culture adequate volume   Culture   Final    NO GROWTH < 12 HOURS Performed at [facility], [place] [address], [place], [place] [address]    Report Status PENDING  Incomplete  Culture, blood (routine x 2)     Status: None (Preliminary result)   Collection Time: 11/15/18  5:47 PM   Specimen: BLOOD RIGHT HAND  Result Value Ref Range Status   Specimen Description BLOOD  RIGHT HAND  Final   Special Requests   Final    BOTTLES DRAWN AEROBIC ONLY Blood Culture adequate volume   Culture   Final    NO GROWTH < 12 HOURS Performed  at [facility], [place] [address], [place], [place] [address]    Report Status PENDING  Incomplete  Aerobic/Anaerobic Culture (surgical/deep wound)     Status: None (Preliminary result)   Collection Time: 11/16/18  9:18 AM   Specimen: Abscess  Result Value Ref Range Status   Specimen Description ABSCESS GALL BLADDER  Final   Special Requests Normal  Final   Gram Stain   Final    FEW WBC PRESENT,BOTH PMN AND MONONUCLEAR RARE GRAM POSITIVE COCCI IN PAIRS RARE GRAM VARIABLE ROD Performed at [facility], [place] [address] [place], [place] [address]    Culture PENDING  Incomplete   Report Status PENDING  Incomplete         Radiology Studies: Ct Chest Wo Contrast  Result Date: 11/15/2018 CLINICAL DATA:  Shortness of breath, fever EXAM: CT CHEST WITHOUT CONTRAST TECHNIQUE: Multidetector CT imaging of the chest was performed following the standard protocol without IV contrast. COMPARISON:  None. FINDINGS: Cardiovascular: There are no significant vascular findings. There is a minimal pericardial effusion seen. Coronary artery calcifications are noted. Scattered aortic atherosclerotic calcifications are seen without aneurysmal dilatation. Mediastinum/Nodes: There are no enlarged mediastinal, hilar or axillary lymph nodes. The thyroid gland, trachea and esophagus demonstrate no significant findings. Lungs/Pleura: Patchy/streaky area of airspace opacities are seen at the posterior bilateral lower lungs. There are small amount of air bronchogram seen within this region, left greater than right. No pneumothorax or pleural effusion. Upper abdomen: Partially visualized low-density collection with foci of air within the gallbladder bed. A small hiatal hernia is present. Musculoskeletal/Chest wall: There is no chest wall mass or suspicious  osseous finding. No acute osseous abnormality IMPRESSION: 1. Mild patchy airspace disease in the posterior lower lungs, left greater than right. This likely represents atelectasis, less likely atypical infectious etiology. 2. Trace pericardial effusion 3.  Aortic Atherosclerosis (ICD10-I70.0). Electronically Signed   By: Prudencio Pair M.D.   On: 11/15/2018 01:07   Ct Abdomen Pelvis W Contrast  Addendum Date: 11/14/2018   ADDENDUM REPORT: 11/14/2018 23:28 ADDENDUM: The mild biliary ductal dilatation reported in the findings, appears to be new since the prior exam. There is also a new mild common bile duct dilatation measuring up to 0.2 cm with mild surrounding fat stranding changes. This could be due to choledocholithiasis. Electronically Signed   By: Prudencio Pair M.D.   On: 11/14/2018 23:28   Result Date: 11/14/2018 CLINICAL DATA:  Nausea vomiting abdominal pain, fever EXAM: CT ABDOMEN AND PELVIS WITH CONTRAST TECHNIQUE: Multidetector CT imaging of the abdomen and pelvis was performed using the standard protocol following bolus administration of intravenous contrast. CONTRAST:  154mL OMNIPAQUE IOHEXOL 300 MG/ML  SOLN COMPARISON:  October 18, 2018 FINDINGS: Lower chest: The visualized heart size within normal limits. No pericardial fluid/thickening. No hiatal hernia. There is patchy/streaky airspace opacity at both lung bases, slightly increased from the prior exam. Hepatobiliary: Within the gallbladder bed there is a 4.1 x 2.8 x 2.7 cm low-density collection now slightly increased in size from the prior exam with a small foci of air. There is mild intrahepatic biliary ductal dilatation. There does appear to be some fat stranding changes seen within the gallbladder bed. No other focal lesions seen.The main portal vein is patent. The patient is status post cholecystectomy. Pancreas: Unremarkable. No pancreatic ductal dilatation or surrounding inflammatory changes. Spleen: Normal in size without focal  abnormality. Adrenals/Urinary Tract: Both adrenal glands appear normal. The kidneys and collecting system appear normal without  evidence of urinary tract calculus or hydronephrosis. Bladder is unremarkable. Again noted are bilateral perinephric stranding. Stomach/Bowel: The stomach, small bowel, and colon are normal in appearance. No inflammatory changes, wall thickening, or obstructive findings.The appendix is normal. Vascular/Lymphatic: There are no enlarged mesenteric, retroperitoneal, or pelvic lymph nodes. Scattered aortic atherosclerotic calcifications are seen without aneurysmal dilatation. Reproductive: The prostate is unremarkable. Other: No evidence of abdominal wall mass or hernia. Musculoskeletal: No acute or significant osseous findings. IMPRESSION: 1. Interval slight increase in the fluid collection within the gallbladder bed now with a small foci of air, measuring 4.1 x 2.8 x 2.7 cm. Given the patient's history this could represent a infected collection within the liver/gallbladder bed. 2. Slight interval increase in the patchy/streaky airspace opacities at both lung bases which could represent atelectasis and/or atypical infectious etiology. Electronically Signed: By: Prudencio Pair M.D. On: 11/14/2018 20:10   Mr Abdomen Mrcp Wo Contrast  Result Date: 11/15/2018 CLINICAL DATA:  Jaundice, abdominal pain. Elevated liver function tests, nausea, and vomiting. Laparoscopic cholecystectomy on 10/04/2018. Mild biliary dilatation on CT of 11/14/2018, query choledocholithiasis. EXAM: MRI ABDOMEN WITHOUT CONTRAST  (INCLUDING MRCP) TECHNIQUE: Multiplanar multisequence MR imaging of the abdomen was performed. Heavily T2-weighted images of the biliary and pancreatic ducts were obtained, and three-dimensional MRCP images were rendered by post processing. COMPARISON:  11/14/2018 FINDINGS: Lower chest: Mild airspace opacities in the posterior basal segments of both lower lobes. Mild cardiomegaly. Hepatobiliary:  Periportal edema. Small amount of gas and fluid in the gallbladder fossa at the site of cholecystectomy. This gas is abnormal 12 days out from surgery. Mild reduction of hepatic parenchymal signal on inphase images compared to out of phase images, along with reduced T2 signal in the spleen, suspicious for hemosiderosis. Currently there is no biliary dilatation. Pancreas:  Pancreas divisum. No findings of acute pancreatitis. Spleen: Less than expected T2 signal and less than expected in phase signal in the spleen, suspicious for hemosiderosis. Adrenals/Urinary Tract: 4 mm fluid signal intensity lesion of the left mid kidney on image 29/4, likely a cyst but too small to characterize. The adrenal glands appear normal. No hydronephrosis. Minimal perirenal stranding bilaterally similar to recent CT appearance. Stomach/Bowel: Diverticulum of the transverse duodenum. Numerous scattered colonic diverticula. Vascular/Lymphatic: Aortoiliac atherosclerotic vascular disease. No pathologic adenopathy identified. Other:  No supplemental non-categorized findings. Musculoskeletal: There is evidence of lumbar degenerative disc disease. IMPRESSION: 1. Small amount of gas and fluid in the gallbladder fossa at the site of cholecystectomy. This gas is abnormal 12 days out from surgery, and the possibility of a small abscess along the gallbladder fossa is raised. 2. No current biliary dilatation. There is nonspecific periportal edema. 3. 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 4. Pancreas divisum. 5. Mild airspace opacities in the posterior basal segments of both lower lobes. 6. Mild cardiomegaly. 7. Colonic diverticulosis. 8.  Aortic Atherosclerosis (ICD10-I70.0). Electronically Signed   By: Van Clines M.D.   On: 11/15/2018 08:58   Dg Chest Port 1 View  Result Date: 11/14/2018 CLINICAL DATA:  Sepsis EXAM: PORTABLE CHEST 1 VIEW COMPARISON:  July 05, 2016 FINDINGS: The heart size and mediastinal  contours is unchanged. There is patchy airspace opacity/blunting seen at the left lung base. Pulmonary vascular congestion is seen. The visualized skeletal structures are unremarkable. IMPRESSION: Pulmonary vascular congestion. Patchy airspace opacity with blunting seen at the left costophrenic angle which could represent small effusion, atelectasis, and/or early infectious etiology. Electronically Signed   By: Ebony Cargo.D.  On: 11/14/2018 19:05   Ct Image Guided Drainage By Percutaneous Catheter  Result Date: 11/16/2018 INDICATION: Post cholecystectomy, now with indeterminate fluid collection with the gallbladder fossa. Please perform image guided aspiration and/or drainage catheter placement. EXAM: CT IMAGE GUIDED DRAINAGE BY PERCUTANEOUS CATHETER COMPARISON:  CT abdomen and pelvis-11/15/2018; MRCP-11/15/2018 MEDICATIONS: The patient is currently admitted to the hospital and receiving intravenous antibiotics. The antibiotics were administered within an appropriate time frame prior to the initiation of the procedure. ANESTHESIA/SEDATION: Moderate (conscious) sedation was employed during this procedure. A total of Versed 1 mg and Fentanyl 25 mcg was administered intravenously. Moderate Sedation Time: 14 minutes. The patient's level of consciousness and vital signs were monitored continuously by radiology nursing throughout the procedure under my direct supervision. CONTRAST:  None COMPLICATIONS: None immediate. PROCEDURE: Informed written consent was obtained from the patient after a discussion of the risks, benefits and alternatives to treatment. The patient was placed supine on the CT gantry and a pre procedural CT was performed re-demonstrating the known abscess/fluid collection within the gallbladder fossa with dominant ill-defined component measuring approximately 4.0 x 2.1 cm (image 44, series 2). The procedure was planned. A timeout was performed prior to the initiation of the procedure. The skin  overlying the right upper abdominal quadrant was prepped and draped in the usual sterile fashion. The overlying soft tissues were anesthetized with 1% lidocaine with epinephrine. Under direct ultrasound guidance, gallbladder fossa fluid collection was accessed with a 18 gauge trocar needle. A short Amplatz wire was coiled within the collection and appropriate positioning was confirmed with CT imaging. Next, appropriate the tract was serially dilated allowing placement of a 10 [place] all-purpose drainage catheter. Appropriate positioning was confirmed with a limited postprocedural CT scan. Approximately 5 cc of purulent fluid was aspirated. The tube was connected to a JP bulb and sutured in place. A dressing was placed. The patient tolerated the procedure well without immediate post procedural complication. IMPRESSION: Successful CT guided placement of a 10 French all purpose drain catheter into the gallbladder fossa fluid collection with aspiration of 5 mL of purulent fluid. Samples were sent to the laboratory as requested by the ordering clinical team. Electronically Signed   By: Sandi Mariscal M.D.   On: 11/16/2018 09:32        Scheduled Meds:  Chlorhexidine Gluconate Cloth  6 each Topical Q0600   cinacalcet  60 mg Oral Q supper   Darbepoetin Alfa       darbepoetin (ARANESP) injection - DIALYSIS  60 mcg Intravenous Q Fri-HD   doxercalciferol  9 mcg Intravenous Q M,W,F-HD   feeding supplement (PRO-STAT SUGAR FREE 64)  30 mL Oral BID   fentaNYL       insulin aspart  0-9 Units Subcutaneous Q4H   lidocaine       midazolam       multivitamin  1 tablet Oral QHS   Continuous Infusions:  ampicillin-sulbactam (UNASYN) IV Stopped (11/15/18 2306)     LOS: 2 days    Time spent: 35 minutes    Edwin Dada, MD [facility] 11/16/2018, 5:16 PM     Please page through [place]:  [email or web address] Password [email or web address] If 7PM-7AM, please contact night-coverage

## 2018-11-16 NOTE — Progress Notes (Signed)
Patient back from IR. Alert and oriented, no complaints of pain. JP drain on RUQ patent, draining tan fluid; dressing intact, clean, dry. VS stable. Will continue to monitor.

## 2018-11-16 NOTE — Procedures (Signed)
Pre procedural Dx: [place] bladder fossa fluid collection Post procedural Dx: Same  Technically successful [place] and CT guided placed of a 10 Fr drainage catheter placement into the GB fossa fluid collection yielding 5 cc of purulent fluid.    All aspirated samples sent to the laboratory for analysis.    EBL: None Complications: None immediate  Ronny Bacon, MD Pager #: [phone]

## 2018-11-16 NOTE — Progress Notes (Addendum)
[facility] Progress Note   Subjective:   Patient seen and examined at bedside.  Reports pain in RUQ.  Denies SOB, CP, n/v/d, orthopnea and edema.   Objective Vitals:   11/16/18 1300 11/16/18 1330 11/16/18 1400 11/16/18 1430  BP: 129/70 139/75 136/77 (!) 142/75  Pulse: 89 92 80 88  Resp:      Temp:      TempSrc:      SpO2:      Weight:       Physical Exam General:NAD, +jaundice, chronically ill appearing male, laying in bed  Heart:RRR, +JVD Lungs:CTAB A/P, nml WOB Abdomen:soft, JP drain in RUQ w/serosanguinous drainage  Extremities:no LE edema  Dialysis Access: LU AVF   Filed Weights   11/16/18 1215  Weight: 88.2 kg    Intake/Output Summary (Last 24 hours) at 11/16/2018 1441 Last data filed at 11/16/2018 1015 Gross per 24 hour  Intake 278.66 ml  Output 298 ml  Net -19.34 ml    Additional Objective Labs: Basic Metabolic Panel: Recent Labs  Lab 11/14/18 1702 11/16/18 0245  NA 133* 135  K 3.9 3.9  CL 95* 97*  CO2 26 25  GLUCOSE 323* 109*  BUN 32* 30*  CREATININE 4.43* 4.58*  CALCIUM 8.7* 8.5*   Liver Function Tests: Recent Labs  Lab 11/14/18 1702 11/16/18 0245  AST 169* 81*  ALT 187* 131*  ALKPHOS 1,114* 746*  BILITOT 7.0* 6.5*  PROT 6.6 5.9*  ALBUMIN 3.1* 2.3*   CBC: Recent Labs  Lab 11/14/18 1702 11/15/18 1210 11/16/18 0245  WBC 7.7 12.1* 8.1  NEUTROABS 7.4  --   --   HGB 10.6* 9.2* 8.9*  HCT 34.2* 29.3* 28.1*  MCV 92.9 92.7 92.4  PLT 190 104* 107*   Blood Culture    Component Value Date/Time   SDES ABSCESS GALL BLADDER 11/16/2018 0918   SPECREQUEST Normal 11/16/2018 0918   CULT PENDING 11/16/2018 0918   REPTSTATUS PENDING 11/16/2018 0918    CBG: Recent Labs  Lab 11/15/18 2150 11/16/18 0008 11/16/18 0423 11/16/18 0741 11/16/18 1200  GLUCAP 163* 116* 110* 121* 132*   Studies/Results: Ct Chest Wo Contrast  Result Date: 11/15/2018 CLINICAL DATA:  Shortness of breath, fever EXAM: CT CHEST WITHOUT CONTRAST  TECHNIQUE: Multidetector CT imaging of the chest was performed following the standard protocol without IV contrast. COMPARISON:  None. FINDINGS: Cardiovascular: There are no significant vascular findings. There is a minimal pericardial effusion seen. Coronary artery calcifications are noted. Scattered aortic atherosclerotic calcifications are seen without aneurysmal dilatation. Mediastinum/Nodes: There are no enlarged mediastinal, hilar or axillary lymph nodes. The thyroid gland, trachea and esophagus demonstrate no significant findings. Lungs/Pleura: Patchy/streaky area of airspace opacities are seen at the posterior bilateral lower lungs. There are small amount of air bronchogram seen within this region, left greater than right. No pneumothorax or pleural effusion. Upper abdomen: Partially visualized low-density collection with foci of air within the gallbladder bed. A small hiatal hernia is present. Musculoskeletal/Chest wall: There is no chest wall mass or suspicious osseous finding. No acute osseous abnormality IMPRESSION: 1. Mild patchy airspace disease in the posterior lower lungs, left greater than right. This likely represents atelectasis, less likely atypical infectious etiology. 2. Trace pericardial effusion 3.  Aortic Atherosclerosis (ICD10-I70.0). Electronically Signed   By: Prudencio Pair M.D.   On: 11/15/2018 01:07   Ct Abdomen Pelvis W Contrast  Addendum Date: 11/14/2018   ADDENDUM REPORT: 11/14/2018 23:28 ADDENDUM: The mild biliary ductal dilatation reported in the findings, appears to  be new since the prior exam. There is also a new mild common bile duct dilatation measuring up to 0.2 cm with mild surrounding fat stranding changes. This could be due to choledocholithiasis. Electronically Signed   By: Prudencio Pair M.D.   On: 11/14/2018 23:28   Result Date: 11/14/2018 CLINICAL DATA:  Nausea vomiting abdominal pain, fever EXAM: CT ABDOMEN AND PELVIS WITH CONTRAST TECHNIQUE: Multidetector CT  imaging of the abdomen and pelvis was performed using the standard protocol following bolus administration of intravenous contrast. CONTRAST:  125mL OMNIPAQUE IOHEXOL 300 MG/ML  SOLN COMPARISON:  October 18, 2018 FINDINGS: Lower chest: The visualized heart size within normal limits. No pericardial fluid/thickening. No hiatal hernia. There is patchy/streaky airspace opacity at both lung bases, slightly increased from the prior exam. Hepatobiliary: Within the gallbladder bed there is a 4.1 x 2.8 x 2.7 cm low-density collection now slightly increased in size from the prior exam with a small foci of air. There is mild intrahepatic biliary ductal dilatation. There does appear to be some fat stranding changes seen within the gallbladder bed. No other focal lesions seen.The main portal vein is patent. The patient is status post cholecystectomy. Pancreas: Unremarkable. No pancreatic ductal dilatation or surrounding inflammatory changes. Spleen: Normal in size without focal abnormality. Adrenals/Urinary Tract: Both adrenal glands appear normal. The kidneys and collecting system appear normal without evidence of urinary tract calculus or hydronephrosis. Bladder is unremarkable. Again noted are bilateral perinephric stranding. Stomach/Bowel: The stomach, small bowel, and colon are normal in appearance. No inflammatory changes, wall thickening, or obstructive findings.The appendix is normal. Vascular/Lymphatic: There are no enlarged mesenteric, retroperitoneal, or pelvic lymph nodes. Scattered aortic atherosclerotic calcifications are seen without aneurysmal dilatation. Reproductive: The prostate is unremarkable. Other: No evidence of abdominal wall mass or hernia. Musculoskeletal: No acute or significant osseous findings. IMPRESSION: 1. Interval slight increase in the fluid collection within the gallbladder bed now with a small foci of air, measuring 4.1 x 2.8 x 2.7 cm. Given the patient's history this could represent a  infected collection within the liver/gallbladder bed. 2. Slight interval increase in the patchy/streaky airspace opacities at both lung bases which could represent atelectasis and/or atypical infectious etiology. Electronically Signed: By: Prudencio Pair M.D. On: 11/14/2018 20:10   Mr Abdomen Mrcp Wo Contrast  Result Date: 11/15/2018 CLINICAL DATA:  Jaundice, abdominal pain. Elevated liver function tests, nausea, and vomiting. Laparoscopic cholecystectomy on 10/04/2018. Mild biliary dilatation on CT of 11/14/2018, query choledocholithiasis. EXAM: MRI ABDOMEN WITHOUT CONTRAST  (INCLUDING MRCP) TECHNIQUE: Multiplanar multisequence MR imaging of the abdomen was performed. Heavily T2-weighted images of the biliary and pancreatic ducts were obtained, and three-dimensional MRCP images were rendered by post processing. COMPARISON:  11/14/2018 FINDINGS: Lower chest: Mild airspace opacities in the posterior basal segments of both lower lobes. Mild cardiomegaly. Hepatobiliary: Periportal edema. Small amount of gas and fluid in the gallbladder fossa at the site of cholecystectomy. This gas is abnormal 12 days out from surgery. Mild reduction of hepatic parenchymal signal on inphase images compared to out of phase images, along with reduced T2 signal in the spleen, suspicious for hemosiderosis. Currently there is no biliary dilatation. Pancreas:  Pancreas divisum. No findings of acute pancreatitis. Spleen: Less than expected T2 signal and less than expected in phase signal in the spleen, suspicious for hemosiderosis. Adrenals/Urinary Tract: 4 mm fluid signal intensity lesion of the left mid kidney on image 29/4, likely a cyst but too small to characterize. The adrenal glands appear normal. No hydronephrosis. Minimal  perirenal stranding bilaterally similar to recent CT appearance. Stomach/Bowel: Diverticulum of the transverse duodenum. Numerous scattered colonic diverticula. Vascular/Lymphatic: Aortoiliac atherosclerotic  vascular disease. No pathologic adenopathy identified. Other:  No supplemental non-categorized findings. Musculoskeletal: There is evidence of lumbar degenerative disc disease. IMPRESSION: 1. Small amount of gas and fluid in the gallbladder fossa at the site of cholecystectomy. This gas is abnormal 12 days out from surgery, and the possibility of a small abscess along the gallbladder fossa is raised. 2. No current biliary dilatation. There is nonspecific periportal edema. 3. 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 4. Pancreas divisum. 5. Mild airspace opacities in the posterior basal segments of both lower lobes. 6. Mild cardiomegaly. 7. Colonic diverticulosis. 8.  Aortic Atherosclerosis (ICD10-I70.0). Electronically Signed   By: Van Clines M.D.   On: 11/15/2018 08:58   Dg Chest Port 1 View  Result Date: 11/14/2018 CLINICAL DATA:  Sepsis EXAM: PORTABLE CHEST 1 VIEW COMPARISON:  July 05, 2016 FINDINGS: The heart size and mediastinal contours is unchanged. There is patchy airspace opacity/blunting seen at the left lung base. Pulmonary vascular congestion is seen. The visualized skeletal structures are unremarkable. IMPRESSION: Pulmonary vascular congestion. Patchy airspace opacity with blunting seen at the left costophrenic angle which could represent small effusion, atelectasis, and/or early infectious etiology. Electronically Signed   By: Prudencio Pair M.D.   On: 11/14/2018 19:05   Ct Image Guided Drainage By Percutaneous Catheter  Result Date: 11/16/2018 INDICATION: Post cholecystectomy, now with indeterminate fluid collection with the gallbladder fossa. Please perform image guided aspiration and/or drainage catheter placement. EXAM: CT IMAGE GUIDED DRAINAGE BY PERCUTANEOUS CATHETER COMPARISON:  CT abdomen and pelvis-11/15/2018; MRCP-11/15/2018 MEDICATIONS: The patient is currently admitted to the hospital and receiving intravenous antibiotics. The antibiotics were  administered within an appropriate time frame prior to the initiation of the procedure. ANESTHESIA/SEDATION: Moderate (conscious) sedation was employed during this procedure. A total of Versed 1 mg and Fentanyl 25 mcg was administered intravenously. Moderate Sedation Time: 14 minutes. The patient's level of consciousness and vital signs were monitored continuously by radiology nursing throughout the procedure under my direct supervision. CONTRAST:  None COMPLICATIONS: None immediate. PROCEDURE: Informed written consent was obtained from the patient after a discussion of the risks, benefits and alternatives to treatment. The patient was placed supine on the CT gantry and a pre procedural CT was performed re-demonstrating the known abscess/fluid collection within the gallbladder fossa with dominant ill-defined component measuring approximately 4.0 x 2.1 cm (image 44, series 2). The procedure was planned. A timeout was performed prior to the initiation of the procedure. The skin overlying the right upper abdominal quadrant was prepped and draped in the usual sterile fashion. The overlying soft tissues were anesthetized with 1% lidocaine with epinephrine. Under direct ultrasound guidance, gallbladder fossa fluid collection was accessed with a 18 gauge trocar needle. A short Amplatz wire was coiled within the collection and appropriate positioning was confirmed with CT imaging. Next, appropriate the tract was serially dilated allowing placement of a 10 [place] all-purpose drainage catheter. Appropriate positioning was confirmed with a limited postprocedural CT scan. Approximately 5 cc of purulent fluid was aspirated. The tube was connected to a JP bulb and sutured in place. A dressing was placed. The patient tolerated the procedure well without immediate post procedural complication. IMPRESSION: Successful CT guided placement of a 10 French all purpose drain catheter into the gallbladder fossa fluid collection with  aspiration of 5 mL of purulent fluid. Samples were sent to  the laboratory as requested by the ordering clinical team. Electronically Signed   By: Sandi Mariscal M.D.   On: 11/16/2018 09:32    Medications: . sodium chloride    . sodium chloride    . sodium chloride    . sodium chloride    . ampicillin-sulbactam (UNASYN) IV Stopped (11/15/18 2306)   . Darbepoetin Alfa      . Chlorhexidine Gluconate Cloth  6 each Topical Q0600  . darbepoetin (ARANESP) injection - DIALYSIS  60 mcg Intravenous Q Fri-HD  . fentaNYL      . insulin aspart  0-9 Units Subcutaneous Q4H  . lidocaine      . midazolam        Dialysis Orders: MWF - [facility]  4.25hrs, BFR 400, DFR 800,  EDW 85.5kg, 2K/ 2Ca  Access: LU AVF  Heparin 2600 unit bolus Mircera 30 mcg q4wks - last 9/30 Hectorol 9 mcg IV qHD  Assessment/Plan: 1. Polymicrobial Bacteremia - BC +enterococcus, E coli and klebsiella pneumonia. Afebrile x24hrs, tmax 103.1. ID following.  On Unasyn, waiting for sensitives. 2.  Abscess at gallbladder fossa - identified on MRCP. Per surgery no indication of need for surgical intervention, following. IR placed JP drain today, 5cc purulent fluid aspirated, cultures pending.  3. 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 Per primary.  4.  ESRD -  On HD MWF.Make up HD yesterday, back on schedule today.  K 3.9.  5.  Hypertension/volume  - BP mostly well controlled, not on antihypertensives.  Does not appear volume overloaded.  +JVD, plan for UF 2L today.  Standing weight post HD to better assess. 6.  Anemia of CKD - Hgb 9.2. Aranesp 93mcg today.  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  7.  Secondary Hyperparathyroidism -  Ca at goal. Phos in goal as OP.  Continue binders, VDRA and sensipar.  8.  Nutrition - Renal diet w/fluid restrictions once advanced. Vit, Nepro 9. DM - per primary  Jen Mow, PA-C [facility] Pager: [phone] 11/16/2018,2:41 PM  LOS: 2 days    Pt seen, examined and agree w A/P as above.  Kelly Splinter  MD 11/16/2018, 3:09 PM

## 2018-11-16 NOTE — Progress Notes (Signed)
  Echocardiogram 2D Echocardiogram has been attempted. Patient left for Hemodialysis.  [name] 11/16/2018, 1:28 PM

## 2018-11-16 NOTE — Care Management Important Message (Signed)
Important Message  Patient Details  Name: Edgar Brewer MRN: [id number] Date of Birth: [birth year]   Medicare Important Message Given:  Yes     [name] 11/16/2018, 3:10 PM

## 2018-11-16 NOTE — Progress Notes (Signed)
Patient went to dialysis. No complaints at this time.

## 2018-11-16 NOTE — Consult Note (Signed)
Subjective: Patient seen at the bedside on rounds this morning.  IR drained gallbladder fossa and was able to aspirate 5 cc of purulent fluid.  Patient tolerated procedure well.  He has some mild abdominal pain but overall feels a little bit better than the day prior.  No other complaints at this time.  Antibiotics:  Anti-infectives (From admission, onward)   Start     Dose/Rate Route Frequency Ordered Stop   11/16/18 1000  cefTRIAXone (ROCEPHIN) 2 g in sodium chloride 0.9 % 100 mL IVPB  Status:  Discontinued     2 g 200 mL/hr over 30 Minutes Intravenous Every 24 hours 11/15/18 1711 11/16/18 1111   11/15/18 2000  Ampicillin-Sulbactam (UNASYN) 3 g in sodium chloride 0.9 % 100 mL IVPB     3 g 200 mL/hr over 30 Minutes Intravenous Every 24 hours 11/15/18 1527     11/15/18 0800  cefTRIAXone (ROCEPHIN) 2 g in sodium chloride 0.9 % 100 mL IVPB  Status:  Discontinued     2 g 200 mL/hr over 30 Minutes Intravenous Daily 11/15/18 0625 11/15/18 1509   11/15/18 0700  ampicillin (OMNIPEN) 2 g in sodium chloride 0.9 % 100 mL IVPB  Status:  Discontinued     2 g 300 mL/hr over 20 Minutes Intravenous Every 12 hours 11/15/18 0625 11/15/18 1509   11/15/18 0600  metroNIDAZOLE (FLAGYL) IVPB 500 mg  Status:  Discontinued     500 mg 100 mL/hr over 60 Minutes Intravenous Every 8 hours 11/14/18 2308 11/15/18 1509   11/14/18 1845  ceFEPIme (MAXIPIME) 2 g in sodium chloride 0.9 % 100 mL IVPB  Status:  Discontinued     2 g 200 mL/hr over 30 Minutes Intravenous  Once 11/14/18 1837 11/14/18 1841   11/14/18 1845  metroNIDAZOLE (FLAGYL) IVPB 500 mg     500 mg 100 mL/hr over 60 Minutes Intravenous  Once 11/14/18 1837 11/14/18 2040   11/14/18 1845  ceFEPIme (MAXIPIME) 2 g in sodium chloride 0.9 % 100 mL IVPB  Status:  Discontinued     2 g 200 mL/hr over 30 Minutes Intravenous Every 24 hours 11/14/18 1841 11/14/18 1842   11/14/18 1845  ceFEPIme (MAXIPIME) 1 g in sodium chloride 0.9 % 100 mL IVPB  Status:   Discontinued     1 g 200 mL/hr over 30 Minutes Intravenous Every 24 hours 11/14/18 1842 11/15/18 0625     Medications: Scheduled Meds:  Chlorhexidine Gluconate Cloth  6 each Topical Q0600   darbepoetin (ARANESP) injection - DIALYSIS  60 mcg Intravenous Q Fri-HD   fentaNYL       insulin aspart  0-9 Units Subcutaneous Q4H   lidocaine       midazolam       Continuous Infusions:  sodium chloride     sodium chloride     sodium chloride     sodium chloride     ampicillin-sulbactam (UNASYN) IV Stopped (11/15/18 2306)   PRN Meds:.sodium chloride, sodium chloride, sodium chloride, sodium chloride, acetaminophen **OR** acetaminophen, alteplase, heparin, [START ON 11/17/2018] heparin, lidocaine (PF), lidocaine-prilocaine, ondansetron (ZOFRAN) IV, pentafluoroprop-tetrafluoroeth  Objective: Weight change:   Intake/Output Summary (Last 24 hours) at 11/16/2018 1235 Last data filed at 11/16/2018 1015 Gross per 24 hour  Intake 278.66 ml  Output 298 ml  Net -19.34 ml   Blood pressure (!) 147/84, pulse 99, temperature 97.6 F (36.4 C), resp. rate 19, SpO2 90 %. Temp:  [97.6 F (36.4 C)-99 F (37.2 C)]  97.6 F (36.4 C) (10/23 1200) Pulse Rate:  [82-99] 99 (10/23 1200) Resp:  [14-23] 19 (10/23 1200) BP: (97-157)/(43-84) 147/84 (10/23 1200) SpO2:  [90 %-100 %] 90 % (10/23 1200)  Physical Exam: General: Resting in bed, NAD HEENT: EMOI, scleral icterus present CV: RRR, normal S1 & S2, no mumurs rubs or gallops appreciated PULM: CTAB Abdomen: padding place at the site of procedure, dry and intact. Mild TTP Skin: Jaundice Neuro: Alert, answers phone during visit and appropriately orders food. No focal deficits. Mild asterixis on exam, improved from day before   CBC: CBC Latest Ref Rng & Units 11/16/2018 11/15/2018 11/14/2018  WBC 4.0 - 10.5 K/uL 8.1 12.1(H) 7.7  Hemoglobin 13.0 - 17.0 g/dL 8.9(L) 9.2(L) 10.6(L)  Hematocrit 39.0 - 52.0 % 28.1(L) 29.3(L) 34.2(L)  Platelets  150 - 400 K/uL 107(L) 104(L) 190   BMET Recent Labs    11/14/18 1702 11/16/18 0245  NA 133* 135  K 3.9 3.9  CL 95* 97*  CO2 26 25  GLUCOSE 323* 109*  BUN 32* 30*  CREATININE 4.43* 4.58*  CALCIUM 8.7* 8.5*   Liver Panel Recent Labs    11/14/18 1702 11/16/18 0245  PROT 6.6 5.9*  ALBUMIN 3.1* 2.3*  AST 169* 81*  ALT 187* 131*  ALKPHOS 1,114* 746*  BILITOT 7.0* 6.5*   Micro Results: Recent Results (from the past 720 hour(s))  SARS CORONAVIRUS 2 (TAT 6-24 HRS) Nasopharyngeal Nasopharyngeal Swab     Status: None   Collection Time: 10/18/18  9:22 PM   Specimen: Nasopharyngeal Swab  Result Value Ref Range Status   SARS Coronavirus 2 NEGATIVE NEGATIVE Final    Comment: (NOTE) SARS-CoV-2 target nucleic acids are NOT DETECTED. The SARS-CoV-2 RNA is generally detectable in upper and lower respiratory specimens during the acute phase of infection. Negative results do not preclude SARS-CoV-2 infection, do not rule out co-infections with other pathogens, and should not be used as the sole basis for treatment or other patient management decisions. Negative results must be combined with clinical observations, patient history, and epidemiological information. The expected result is Negative. Fact Sheet for Patients: SugarRoll.be Fact Sheet for Healthcare Providers: https://www.woods-mathews.com/ This test is not yet approved or cleared by the [place] FDA and  has been authorized for detection and/or diagnosis of SARS-CoV-2 by FDA under an Emergency Use Authorization (EUA). This EUA will remain  in effect (meaning this test can be used) for the duration of the COVID-19 declaration under Section 56 4(b)(1) of the Act, 21 U.S.C. section 360bbb-3(b)(1), unless the authorization is terminated or revoked sooner. Performed at [facility], [place] [address] [place], [place] [address]   Culture, blood (routine x 2)     Status: None     Collection Time: 10/18/18 11:59 PM   Specimen: BLOOD RIGHT HAND  Result Value Ref Range Status   Specimen Description BLOOD RIGHT HAND  Final   Special Requests AEROBIC BOTTLE ONLY Blood Culture adequate volume  Final   Culture   Final    NO GROWTH 5 DAYS Performed at [facility], [place] [address] [place], [place] [address]    Report Status 10/24/2018 FINAL  Final  Culture, blood (routine x 2)     Status: None   Collection Time: 10/19/18 12:04 AM   Specimen: BLOOD RIGHT FOREARM  Result Value Ref Range Status   Specimen Description BLOOD RIGHT FOREARM  Final   Special Requests   Final    BOTTLES DRAWN AEROBIC AND ANAEROBIC  Blood Culture adequate volume   Culture   Final    NO GROWTH 5 DAYS Performed at [facility], [place] [address], [place], [place] [address]    Report Status 10/24/2018 FINAL  Final  Culture, blood (Routine x 2)     Status: None (Preliminary result)   Collection Time: 11/14/18  5:03 PM   Specimen: BLOOD  Result Value Ref Range Status   Specimen Description BLOOD RIGHT ANTECUBITAL  Final   Special Requests   Final    BOTTLES DRAWN AEROBIC AND ANAEROBIC Blood Culture results may not be optimal due to an inadequate volume of blood received in culture bottles   Culture  Setup Time   Final    ANAEROBIC BOTTLE ONLY GRAM NEGATIVE RODS GRAM POSITIVE COCCI Organism ID to follow AEROBIC BOTTLE ONLY GRAM NEGATIVE RODS GRAM POSITIVE COCCI CRITICAL RESULT CALLED TO, READ BACK BY AND VERIFIED WITH: G ABBOTT PHARMD 11/15/18 0601 JDW    Culture   Final    CULTURE REINCUBATED FOR BETTER GROWTH Performed at [facility], [place] [address] [place], [place] [address]    Report Status PENDING  Incomplete  Blood Culture ID Panel (Reflexed)     Status: Abnormal   Collection Time: 11/14/18  5:03 PM  Result Value Ref Range Status   Enterococcus species DETECTED (A) NOT DETECTED Final    Comment: CRITICAL RESULT CALLED TO, READ BACK BY AND VERIFIED WITH: G ABBOTT  PHARMD 11/15/18 0601 JDW    Vancomycin resistance NOT DETECTED NOT DETECTED Final   Listeria monocytogenes NOT DETECTED NOT DETECTED Final   Staphylococcus species NOT DETECTED NOT DETECTED Final   Staphylococcus aureus (BCID) NOT DETECTED NOT DETECTED Final   Streptococcus species NOT DETECTED NOT DETECTED Final   Streptococcus agalactiae NOT DETECTED NOT DETECTED Final   Streptococcus pneumoniae NOT DETECTED NOT DETECTED Final   Streptococcus pyogenes NOT DETECTED NOT DETECTED Final   Acinetobacter baumannii NOT DETECTED NOT DETECTED Final   Enterobacteriaceae species DETECTED (A) NOT DETECTED Final    Comment: CRITICAL RESULT CALLED TO, READ BACK BY AND VERIFIED WITH: G ABBOTT PHARMD 11/15/18 0601 JDW    Enterobacter cloacae complex NOT DETECTED NOT DETECTED Final   Escherichia coli DETECTED (A) NOT DETECTED Final    Comment: CRITICAL RESULT CALLED TO, READ BACK BY AND VERIFIED WITH: G ABBOTT PHARMD 11/15/18 0601 JDW    Klebsiella oxytoca NOT DETECTED NOT DETECTED Final   Klebsiella pneumoniae DETECTED (A) NOT DETECTED Final    Comment: CRITICAL RESULT CALLED TO, READ BACK BY AND VERIFIED WITH: G ABBOTT PHARMD 11/15/18 0601 JDW    Proteus species NOT DETECTED NOT DETECTED Final   Serratia marcescens NOT DETECTED NOT DETECTED Final   Carbapenem resistance NOT DETECTED NOT DETECTED Final   Haemophilus influenzae NOT DETECTED NOT DETECTED Final   Neisseria meningitidis NOT DETECTED NOT DETECTED Final   Pseudomonas aeruginosa NOT DETECTED NOT DETECTED Final   Candida albicans NOT DETECTED NOT DETECTED Final   Candida glabrata NOT DETECTED NOT DETECTED Final   Candida krusei NOT DETECTED NOT DETECTED Final   Candida parapsilosis NOT DETECTED NOT DETECTED Final   Candida tropicalis NOT DETECTED NOT DETECTED Final    Comment: Performed at [facility], [address], [place], [place] [address]  Culture, blood (Routine x 2)     Status: None (Preliminary result)   Collection  Time: 11/14/18  6:40 PM   Specimen: BLOOD RIGHT FOREARM  Result Value Ref Range Status  Specimen Description BLOOD RIGHT FOREARM  Final   Special Requests   Final    BOTTLES DRAWN AEROBIC AND ANAEROBIC Blood Culture results may not be optimal due to an inadequate volume of blood received in culture bottles   Culture  Setup Time   Final    IN BOTH AEROBIC AND ANAEROBIC BOTTLES GRAM POSITIVE COCCI GRAM NEGATIVE RODS CRITICAL VALUE NOTED.  VALUE IS CONSISTENT WITH PREVIOUSLY REPORTED AND CALLED VALUE.    Culture   Final    CULTURE REINCUBATED FOR BETTER GROWTH Performed at [facility], [place] [address] [place], [place] [address]    Report Status PENDING  Incomplete  SARS CORONAVIRUS 2 (TAT 6-24 HRS) Nasopharyngeal Nasopharyngeal Swab     Status: None   Collection Time: 11/14/18 10:33 PM   Specimen: Nasopharyngeal Swab  Result Value Ref Range Status   SARS Coronavirus 2 NEGATIVE NEGATIVE Final    Comment: (NOTE) SARS-CoV-2 target nucleic acids are NOT DETECTED. The SARS-CoV-2 RNA is generally detectable in upper and lower respiratory specimens during the acute phase of infection. Negative results do not preclude SARS-CoV-2 infection, do not rule out co-infections with other pathogens, and should not be used as the sole basis for treatment or other patient management decisions. Negative results must be combined with clinical observations, patient history, and epidemiological information. The expected result is Negative. Fact Sheet for Patients: SugarRoll.be Fact Sheet for Healthcare Providers: https://www.woods-mathews.com/ This test is not yet approved or cleared by the [place] FDA and  has been authorized for detection and/or diagnosis of SARS-CoV-2 by FDA under an Emergency Use Authorization (EUA). This EUA will remain  in effect (meaning this test can be used) for the duration of the COVID-19 declaration under Section  56 4(b)(1) of the Act, 21 U.S.C. section 360bbb-3(b)(1), unless the authorization is terminated or revoked sooner. Performed at [facility], [place] [address], [place], [place] [address]   MRSA PCR Screening     Status: None   Collection Time: 11/15/18  3:57 PM   Specimen: Nasopharyngeal  Result Value Ref Range Status   MRSA by PCR NEGATIVE NEGATIVE Final    Comment:        The GeneXpert MRSA Assay (FDA approved for NASAL specimens only), is one component of a comprehensive MRSA colonization surveillance program. It is not intended to diagnose MRSA infection nor to guide or monitor treatment for MRSA infections. Performed at [facility], [place] [address] [place], [place] [address]   Culture, blood (routine x 2)     Status: None (Preliminary result)   Collection Time: 11/15/18  5:30 PM   Specimen: BLOOD  Result Value Ref Range Status   Specimen Description BLOOD RIGHT ANTECUBITAL  Final   Special Requests   Final    BOTTLES DRAWN AEROBIC ONLY Blood Culture adequate volume   Culture   Final    NO GROWTH < 12 HOURS Performed at [facility], [place] [address] [place], [place] [address]    Report Status PENDING  Incomplete  Culture, blood (routine x 2)     Status: None (Preliminary result)   Collection Time: 11/15/18  5:47 PM   Specimen: BLOOD RIGHT HAND  Result Value Ref Range Status   Specimen Description BLOOD RIGHT HAND  Final   Special Requests   Final    BOTTLES DRAWN AEROBIC ONLY Blood Culture adequate volume   Culture   Final    NO GROWTH < 12 HOURS Performed at  [facility], [place] [address], [place], [place] [address]    Report Status PENDING  Incomplete  Aerobic/Anaerobic Culture (surgical/deep wound)     Status: None (Preliminary result)   Collection Time: 11/16/18  9:18 AM   Specimen: Abscess  Result Value Ref Range Status   Specimen Description ABSCESS GALL BLADDER  Final   Special Requests Normal  Final   Gram Stain   Final    FEW  WBC PRESENT,BOTH PMN AND MONONUCLEAR RARE GRAM POSITIVE COCCI IN PAIRS RARE GRAM VARIABLE ROD Performed at [facility], [place] [address], [place], [place] [address]    Culture PENDING  Incomplete   Report Status PENDING  Incomplete   Studies/Results: Ct Chest Wo Contrast  Result Date: 11/15/2018 CLINICAL DATA:  Shortness of breath, fever EXAM: CT CHEST WITHOUT CONTRAST TECHNIQUE: Multidetector CT imaging of the chest was performed following the standard protocol without IV contrast. COMPARISON:  None. FINDINGS: Cardiovascular: There are no significant vascular findings. There is a minimal pericardial effusion seen. Coronary artery calcifications are noted. Scattered aortic atherosclerotic calcifications are seen without aneurysmal dilatation. Mediastinum/Nodes: There are no enlarged mediastinal, hilar or axillary lymph nodes. The thyroid gland, trachea and esophagus demonstrate no significant findings. Lungs/Pleura: Patchy/streaky area of airspace opacities are seen at the posterior bilateral lower lungs. There are small amount of air bronchogram seen within this region, left greater than right. No pneumothorax or pleural effusion. Upper abdomen: Partially visualized low-density collection with foci of air within the gallbladder bed. A small hiatal hernia is present. Musculoskeletal/Chest wall: There is no chest wall mass or suspicious osseous finding. No acute osseous abnormality IMPRESSION: 1. Mild patchy airspace disease in the posterior lower lungs, left greater than right. This likely represents atelectasis, less likely atypical infectious etiology. 2. Trace pericardial effusion 3.  Aortic Atherosclerosis (ICD10-I70.0). Electronically Signed   By: Prudencio Pair M.D.   On: 11/15/2018 01:07   Ct Abdomen Pelvis W Contrast  Addendum Date: 11/14/2018   ADDENDUM REPORT: 11/14/2018 23:28 ADDENDUM: The mild biliary ductal dilatation reported in the findings, appears to be new since the prior exam.  There is also a new mild common bile duct dilatation measuring up to 0.2 cm with mild surrounding fat stranding changes. This could be due to choledocholithiasis. Electronically Signed   By: Prudencio Pair M.D.   On: 11/14/2018 23:28   Result Date: 11/14/2018 CLINICAL DATA:  Nausea vomiting abdominal pain, fever EXAM: CT ABDOMEN AND PELVIS WITH CONTRAST TECHNIQUE: Multidetector CT imaging of the abdomen and pelvis was performed using the standard protocol following bolus administration of intravenous contrast. CONTRAST:  115mL OMNIPAQUE IOHEXOL 300 MG/ML  SOLN COMPARISON:  October 18, 2018 FINDINGS: Lower chest: The visualized heart size within normal limits. No pericardial fluid/thickening. No hiatal hernia. There is patchy/streaky airspace opacity at both lung bases, slightly increased from the prior exam. Hepatobiliary: Within the gallbladder bed there is a 4.1 x 2.8 x 2.7 cm low-density collection now slightly increased in size from the prior exam with a small foci of air. There is mild intrahepatic biliary ductal dilatation. There does appear to be some fat stranding changes seen within the gallbladder bed. No other focal lesions seen.The main portal vein is patent. The patient is status post cholecystectomy. Pancreas: Unremarkable. No pancreatic ductal dilatation or surrounding inflammatory changes. Spleen: Normal in size without focal abnormality. Adrenals/Urinary Tract: Both adrenal glands appear normal. The kidneys and collecting system appear normal without evidence of urinary tract calculus or hydronephrosis. Bladder  is unremarkable. Again noted are bilateral perinephric stranding. Stomach/Bowel: The stomach, small bowel, and colon are normal in appearance. No inflammatory changes, wall thickening, or obstructive findings.The appendix is normal. Vascular/Lymphatic: There are no enlarged mesenteric, retroperitoneal, or pelvic lymph nodes. Scattered aortic atherosclerotic calcifications are seen without  aneurysmal dilatation. Reproductive: The prostate is unremarkable. Other: No evidence of abdominal wall mass or hernia. Musculoskeletal: No acute or significant osseous findings. IMPRESSION: 1. Interval slight increase in the fluid collection within the gallbladder bed now with a small foci of air, measuring 4.1 x 2.8 x 2.7 cm. Given the patient's history this could represent a infected collection within the liver/gallbladder bed. 2. Slight interval increase in the patchy/streaky airspace opacities at both lung bases which could represent atelectasis and/or atypical infectious etiology. Electronically Signed: By: Prudencio Pair M.D. On: 11/14/2018 20:10   Mr Abdomen Mrcp Wo Contrast  Result Date: 11/15/2018 CLINICAL DATA:  Jaundice, abdominal pain. Elevated liver function tests, nausea, and vomiting. Laparoscopic cholecystectomy on 10/04/2018. Mild biliary dilatation on CT of 11/14/2018, query choledocholithiasis. EXAM: MRI ABDOMEN WITHOUT CONTRAST  (INCLUDING MRCP) TECHNIQUE: Multiplanar multisequence MR imaging of the abdomen was performed. Heavily T2-weighted images of the biliary and pancreatic ducts were obtained, and three-dimensional MRCP images were rendered by post processing. COMPARISON:  11/14/2018 FINDINGS: Lower chest: Mild airspace opacities in the posterior basal segments of both lower lobes. Mild cardiomegaly. Hepatobiliary: Periportal edema. Small amount of gas and fluid in the gallbladder fossa at the site of cholecystectomy. This gas is abnormal 12 days out from surgery. Mild reduction of hepatic parenchymal signal on inphase images compared to out of phase images, along with reduced T2 signal in the spleen, suspicious for hemosiderosis. Currently there is no biliary dilatation. Pancreas:  Pancreas divisum. No findings of acute pancreatitis. Spleen: Less than expected T2 signal and less than expected in phase signal in the spleen, suspicious for hemosiderosis. Adrenals/Urinary Tract: 4 mm fluid  signal intensity lesion of the left mid kidney on image 29/4, likely a cyst but too small to characterize. The adrenal glands appear normal. No hydronephrosis. Minimal perirenal stranding bilaterally similar to recent CT appearance. Stomach/Bowel: Diverticulum of the transverse duodenum. Numerous scattered colonic diverticula. Vascular/Lymphatic: Aortoiliac atherosclerotic vascular disease. No pathologic adenopathy identified. Other:  No supplemental non-categorized findings. Musculoskeletal: There is evidence of lumbar degenerative disc disease. IMPRESSION: 1. Small amount of gas and fluid in the gallbladder fossa at the site of cholecystectomy. This gas is abnormal 12 days out from surgery, and the possibility of a small abscess along the gallbladder fossa is raised. 2. No current biliary dilatation. There is nonspecific periportal edema. 3. 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 4. Pancreas divisum. 5. Mild airspace opacities in the posterior basal segments of both lower lobes. 6. Mild cardiomegaly. 7. Colonic diverticulosis. 8.  Aortic Atherosclerosis (ICD10-I70.0). Electronically Signed   By: Van Clines M.D.   On: 11/15/2018 08:58   Dg Chest Port 1 View  Result Date: 11/14/2018 CLINICAL DATA:  Sepsis EXAM: PORTABLE CHEST 1 VIEW COMPARISON:  July 05, 2016 FINDINGS: The heart size and mediastinal contours is unchanged. There is patchy airspace opacity/blunting seen at the left lung base. Pulmonary vascular congestion is seen. The visualized skeletal structures are unremarkable. IMPRESSION: Pulmonary vascular congestion. Patchy airspace opacity with blunting seen at the left costophrenic angle which could represent small effusion, atelectasis, and/or early infectious etiology. Electronically Signed   By: Prudencio Pair M.D.   On: 11/14/2018 19:05   Ct  Image Guided Drainage By Percutaneous Catheter  Result Date: 11/16/2018 INDICATION: Post cholecystectomy, now with  indeterminate fluid collection with the gallbladder fossa. Please perform image guided aspiration and/or drainage catheter placement. EXAM: CT IMAGE GUIDED DRAINAGE BY PERCUTANEOUS CATHETER COMPARISON:  CT abdomen and pelvis-11/15/2018; MRCP-11/15/2018 MEDICATIONS: The patient is currently admitted to the hospital and receiving intravenous antibiotics. The antibiotics were administered within an appropriate time frame prior to the initiation of the procedure. ANESTHESIA/SEDATION: Moderate (conscious) sedation was employed during this procedure. A total of Versed 1 mg and Fentanyl 25 mcg was administered intravenously. Moderate Sedation Time: 14 minutes. The patient's level of consciousness and vital signs were monitored continuously by radiology nursing throughout the procedure under my direct supervision. CONTRAST:  None COMPLICATIONS: None immediate. PROCEDURE: Informed written consent was obtained from the patient after a discussion of the risks, benefits and alternatives to treatment. The patient was placed supine on the CT gantry and a pre procedural CT was performed re-demonstrating the known abscess/fluid collection within the gallbladder fossa with dominant ill-defined component measuring approximately 4.0 x 2.1 cm (image 44, series 2). The procedure was planned. A timeout was performed prior to the initiation of the procedure. The skin overlying the right upper abdominal quadrant was prepped and draped in the usual sterile fashion. The overlying soft tissues were anesthetized with 1% lidocaine with epinephrine. Under direct ultrasound guidance, gallbladder fossa fluid collection was accessed with a 18 gauge trocar needle. A short Amplatz wire was coiled within the collection and appropriate positioning was confirmed with CT imaging. Next, appropriate the tract was serially dilated allowing placement of a 10 [place] all-purpose drainage catheter. Appropriate positioning was confirmed with a limited  postprocedural CT scan. Approximately 5 cc of purulent fluid was aspirated. The tube was connected to a JP bulb and sutured in place. A dressing was placed. The patient tolerated the procedure well without immediate post procedural complication. IMPRESSION: Successful CT guided placement of a 10 French all purpose drain catheter into the gallbladder fossa fluid collection with aspiration of 5 mL of purulent fluid. Samples were sent to the laboratory as requested by the ordering clinical team. Electronically Signed   By: Sandi Mariscal M.D.   On: 11/16/2018 09:32   Assessment/Plan:  INTERVAL HISTORY: 10/23- IR successfully placed 10 Fr drainage catheter into the gallbladder fossa and aspirated 5 cc of purulent fluid.  Sample sent to lab for analysis.  Principal Problem:   Intra-abdominal abscess ([place]) Active Problems:   ESRD (end stage renal disease) on dialysis ([place])   Elevated LFTs   Sepsis ([place])   Acute respiratory failure with hypoxia ([place])   Gallbladder abscess   Edgar Brewer is a 58 y.o. male with past history significant for ESRD (HD MWF), chronic diastolic CHF, F5DD, GERD, HTN who presented with fevers (Tmax 103.1), tachycardia and respiratory distress. Blood cultures grew Enterococcus species, E. coli, and klebsiella pneumonia. MRCP was notable for fluid collection and gas in the gallbladder fossa, suspicious for a small abscess at the site of previous cholecystectomy. The patient underwent drain placement by IR this morning and was able to drain fluid.  The patient tolerated the procedure well and appears to be doing clinically better today, but still encephalopathic.   1. Continue Unasyn 2. TTE ordered 3. Repeat blood cultures today 4. Follow up aspirate labs 5. May consider lactulose if encephalopathy persists.   LOS: 2 days   Earlene Plater, MD Internal Medicine, PGY1 Pager: [phone]  11/16/2018,12:47 PM

## 2018-11-17 ENCOUNTER — Inpatient Hospital Stay (HOSPITAL_COMMUNITY): Payer: Medicare Other

## 2018-11-17 DIAGNOSIS — I340 Nonrheumatic mitral (valve) insufficiency: Secondary | ICD-10-CM

## 2018-11-17 LAB — CBC
HCT: 29.8 % — ABNORMAL LOW (ref 39.0–52.0)
Hemoglobin: 9.6 g/dL — ABNORMAL LOW (ref 13.0–17.0)
MCH: 29 pg (ref 26.0–34.0)
MCHC: 32.2 g/dL (ref 30.0–36.0)
MCV: 90 fL (ref 80.0–100.0)
Platelets: 128 10*3/uL — ABNORMAL LOW (ref 150–400)
RBC: 3.31 MIL/uL — ABNORMAL LOW (ref 4.22–5.81)
RDW: 15.3 % (ref 11.5–15.5)
WBC: 7.2 10*3/uL (ref 4.0–10.5)
nRBC: 0 % (ref 0.0–0.2)

## 2018-11-17 LAB — GLUCOSE, CAPILLARY
Glucose-Capillary: 150 mg/dL — ABNORMAL HIGH (ref 70–99)
Glucose-Capillary: 182 mg/dL — ABNORMAL HIGH (ref 70–99)
Glucose-Capillary: 188 mg/dL — ABNORMAL HIGH (ref 70–99)
Glucose-Capillary: 219 mg/dL — ABNORMAL HIGH (ref 70–99)

## 2018-11-17 LAB — COMPREHENSIVE METABOLIC PANEL
ALT: 99 U/L — ABNORMAL HIGH (ref 0–44)
AST: 45 U/L — ABNORMAL HIGH (ref 15–41)
Albumin: 2.3 g/dL — ABNORMAL LOW (ref 3.5–5.0)
Alkaline Phosphatase: 815 U/L — ABNORMAL HIGH (ref 38–126)
Anion gap: 15 (ref 5–15)
BUN: 21 mg/dL — ABNORMAL HIGH (ref 6–20)
CO2: 25 mmol/L (ref 22–32)
Calcium: 8.4 mg/dL — ABNORMAL LOW (ref 8.9–10.3)
Chloride: 96 mmol/L — ABNORMAL LOW (ref 98–111)
Creatinine, Ser: 3.76 mg/dL — ABNORMAL HIGH (ref 0.61–1.24)
GFR calc Af Amer: 19 mL/min — ABNORMAL LOW (ref >60)
GFR calc non Af Amer: 17 mL/min — ABNORMAL LOW (ref >60)
Glucose, Bld: 168 mg/dL — ABNORMAL HIGH (ref 70–99)
Potassium: 3.7 mmol/L (ref 3.5–5.1)
Sodium: 136 mmol/L (ref 135–145)
Total Bilirubin: 6.6 mg/dL — ABNORMAL HIGH (ref 0.3–1.2)
Total Protein: 6.5 g/dL (ref 6.5–8.1)

## 2018-11-17 LAB — ECHOCARDIOGRAM COMPLETE: Weight: 3061.75 oz

## 2018-11-17 MED ORDER — ORAL CARE MOUTH RINSE
15.0000 mL | Freq: Two times a day (BID) | OROMUCOSAL | Status: DC
  Administered 2018-11-17 – 2018-11-20 (×6): 15 mL via OROMUCOSAL

## 2018-11-17 NOTE — Progress Notes (Signed)
[facility] Progress Note   MWF -NW 4.25hrs, BFR400, [id number], EDW 85.5kg,2K/2Ca Access:LUA BCF Heparin2600 unit bolus Mircera48mcg q4wks - last9/30 Hectorol51mcg IV qHD  Assessment/ Plan:    1. Polymicrobial Bacteremia - BC +enterococcus, E coli and klebsiella pneumonia. Afebrile x24hrs, tmax 103.1. ID following. On Unasyn, waiting for sensitives. 2. Abscess at gallbladder fossa - identified on MRCP. Per surgery no indication of need for surgical intervention, following. IR placed JP drain today, 5cc purulent fluid aspirated, cultures pending.  3. 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 Per primary. 4. ESRD - On HD MWF. Last HD  Fri (net UF 1.5; post wt 86.8kg); next Mon on MWF regimen. 5. Hypertension/volume - BP mostly well controlled, not on antihypertensives. Does not appear volume overloaded. +JVD 6. Anemia of CKD - Hgb 9.2. Aranesp 37mcg 10/23. 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 7. Secondary Hyperparathyroidism - Ca at goal. Phos in goal as OP. Continue binders, VDRA and sensipar. Will check phos in AM 8. Nutrition - Renal diet w/fluid restrictions once advanced. Vit, Nepro 9. DM - per primary  Subjective:   Patient seen and examined at bedside.  Reports pain in abd but notably better than yest. Loose BM's (4-5 yest).  Denies SOB, CP, n/v/d, orthopnea and edema.    Objective:   BP (!) 148/75 (BP Location: Right Arm)   Pulse 87   Temp 98.1 F (36.7 C) (Oral)   Resp (!) 22   Wt 86.8 kg   SpO2 98%   BMI 29.10 kg/m   Intake/Output Summary (Last 24 hours) at 11/17/2018 0719 Last data filed at 11/17/2018 0630 Gross per 24 hour  Intake 626.48 ml  Output 1603 ml  Net -976.52 ml   Weight change:   Physical Exam: General:NAD, +jaundice, chronically ill appearing male, laying in bed  Heart:RRR, +JVD Lungs:CTAB A/P, nml WOB Abdomen:soft, JP drain in RUQ w/serosanguinous drainage   Extremities:no LE edema       Dialysis Access: LUA BCF great bruit  Imaging: Mr Abdomen Mrcp Wo Contrast  Result Date: 11/15/2018 CLINICAL DATA:  Jaundice, abdominal pain. Elevated liver function tests, nausea, and vomiting. Laparoscopic cholecystectomy on 10/04/2018. Mild biliary dilatation on CT of 11/14/2018, query choledocholithiasis. EXAM: MRI ABDOMEN WITHOUT CONTRAST  (INCLUDING MRCP) TECHNIQUE: Multiplanar multisequence MR imaging of the abdomen was performed. Heavily T2-weighted images of the biliary and pancreatic ducts were obtained, and three-dimensional MRCP images were rendered by post processing. COMPARISON:  11/14/2018 FINDINGS: Lower chest: Mild airspace opacities in the posterior basal segments of both lower lobes. Mild cardiomegaly. Hepatobiliary: Periportal edema. Small amount of gas and fluid in the gallbladder fossa at the site of cholecystectomy. This gas is abnormal 12 days out from surgery. Mild reduction of hepatic parenchymal signal on inphase images compared to out of phase images, along with reduced T2 signal in the spleen, suspicious for hemosiderosis. Currently there is no biliary dilatation. Pancreas:  Pancreas divisum. No findings of acute pancreatitis. Spleen: Less than expected T2 signal and less than expected in phase signal in the spleen, suspicious for hemosiderosis. Adrenals/Urinary Tract: 4 mm fluid signal intensity lesion of the left mid kidney on image 29/4, likely a cyst but too small to characterize. The adrenal glands appear normal. No hydronephrosis. Minimal perirenal stranding bilaterally similar to recent CT appearance. Stomach/Bowel: Diverticulum of the transverse duodenum. Numerous scattered colonic diverticula. Vascular/Lymphatic: Aortoiliac atherosclerotic vascular disease. No pathologic adenopathy identified. Other:  No supplemental non-categorized findings. Musculoskeletal: There is evidence of lumbar degenerative disc disease.  IMPRESSION: 1. Small amount  of gas and fluid in the gallbladder fossa at the site of cholecystectomy. This gas is abnormal 12 days out from surgery, and the possibility of a small abscess along the gallbladder fossa is raised. 2. No current biliary dilatation. There is nonspecific periportal edema. 3. 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 4. Pancreas divisum. 5. Mild airspace opacities in the posterior basal segments of both lower lobes. 6. Mild cardiomegaly. 7. Colonic diverticulosis. 8.  Aortic Atherosclerosis (ICD10-I70.0). Electronically Signed   By: Van Clines M.D.   On: 11/15/2018 08:58   Ct Image Guided Drainage By Percutaneous Catheter  Result Date: 11/16/2018 INDICATION: Post cholecystectomy, now with indeterminate fluid collection with the gallbladder fossa. Please perform image guided aspiration and/or drainage catheter placement. EXAM: CT IMAGE GUIDED DRAINAGE BY PERCUTANEOUS CATHETER COMPARISON:  CT abdomen and pelvis-11/15/2018; MRCP-11/15/2018 MEDICATIONS: The patient is currently admitted to the hospital and receiving intravenous antibiotics. The antibiotics were administered within an appropriate time frame prior to the initiation of the procedure. ANESTHESIA/SEDATION: Moderate (conscious) sedation was employed during this procedure. A total of Versed 1 mg and Fentanyl 25 mcg was administered intravenously. Moderate Sedation Time: 14 minutes. The patient's level of consciousness and vital signs were monitored continuously by radiology nursing throughout the procedure under my direct supervision. CONTRAST:  None COMPLICATIONS: None immediate. PROCEDURE: Informed written consent was obtained from the patient after a discussion of the risks, benefits and alternatives to treatment. The patient was placed supine on the CT gantry and a pre procedural CT was performed re-demonstrating the known abscess/fluid collection within the gallbladder fossa with dominant ill-defined component  measuring approximately 4.0 x 2.1 cm (image 44, series 2). The procedure was planned. A timeout was performed prior to the initiation of the procedure. The skin overlying the right upper abdominal quadrant was prepped and draped in the usual sterile fashion. The overlying soft tissues were anesthetized with 1% lidocaine with epinephrine. Under direct ultrasound guidance, gallbladder fossa fluid collection was accessed with a 18 gauge trocar needle. A short Amplatz wire was coiled within the collection and appropriate positioning was confirmed with CT imaging. Next, appropriate the tract was serially dilated allowing placement of a 10 [place] all-purpose drainage catheter. Appropriate positioning was confirmed with a limited postprocedural CT scan. Approximately 5 cc of purulent fluid was aspirated. The tube was connected to a JP bulb and sutured in place. A dressing was placed. The patient tolerated the procedure well without immediate post procedural complication. IMPRESSION: Successful CT guided placement of a 10 French all purpose drain catheter into the gallbladder fossa fluid collection with aspiration of 5 mL of purulent fluid. Samples were sent to the laboratory as requested by the ordering clinical team. Electronically Signed   By: Sandi Mariscal M.D.   On: 11/16/2018 09:32    Labs: BMET Recent Labs  Lab 11/14/18 1702 11/16/18 0245 11/17/18 0247  NA 133* 135 136  K 3.9 3.9 3.7  CL 95* 97* 96*  CO2 26 25 25   GLUCOSE 323* 109* 168*  BUN 32* 30* 21*  CREATININE 4.43* 4.58* 3.76*  CALCIUM 8.7* 8.5* 8.4*   CBC Recent Labs  Lab 11/14/18 1702 11/15/18 1210 11/16/18 0245 11/17/18 0247  WBC 7.7 12.1* 8.1 7.2  NEUTROABS 7.4  --   --   --   HGB 10.6* 9.2* 8.9* 9.6*  HCT 34.2* 29.3* 28.1* 29.8*  MCV 92.9 92.7 92.4 90.0  PLT 190 104* 107* 128*  Medications:    . Chlorhexidine Gluconate Cloth  6 each Topical Q0600  . cinacalcet  60 mg Oral Q supper  . darbepoetin (ARANESP) injection -  DIALYSIS  60 mcg Intravenous Q Fri-HD  . doxercalciferol  9 mcg Intravenous Q M,W,F-HD  . feeding supplement (PRO-STAT SUGAR FREE 64)  30 mL Oral BID  . insulin aspart  0-9 Units Subcutaneous TID WC  . mouth rinse  15 mL Mouth Rinse BID  . multivitamin  1 tablet Oral QHS  . sodium chloride flush  5 mL Intracatheter Q8H      Otelia Santee, MD 11/17/2018, 7:19 AM

## 2018-11-17 NOTE — Progress Notes (Signed)
Patient's BG=219 and he doesn't have a night time sliding scale. Paged Blount NP and notified to see if patient could get insulin tonight. No new order received. Will continue to [facility]

## 2018-11-17 NOTE — Progress Notes (Signed)
Chief Complaint: Patient was seen today for Gb fossa abscess drain  Supervising Physician: Jacqulynn Cadet  Patient Status: [facility] - In-pt  Subjective: S/p perc drain to gb fossa fluid collection 10/23 Pt feeling okay this am.  Objective: Physical Exam: BP (!) 148/75 (BP Location: Right Arm)    Pulse 87    Temp 98.1 F (36.7 C) (Oral)    Resp (!) 22    Wt 86.8 kg    SpO2 98%    BMI 29.10 kg/m  RUQ drain intact, mostly serous output with some stranding debris. Site clean, dry.   Current Facility-Administered Medications:    0.9 %  sodium chloride infusion, , Intravenous, PRN, Edwin Dada, MD, Stopped at 11/16/18 2242   acetaminophen (TYLENOL) tablet 650 mg, 650 mg, Oral, Q6H PRN, 650 mg at 11/16/18 2029 **OR** acetaminophen (TYLENOL) suppository 650 mg, 650 mg, Rectal, Q6H PRN, Shela Leff, MD   Ampicillin-Sulbactam (UNASYN) 3 g in sodium chloride 0.9 % 100 mL IVPB, 3 g, Intravenous, Q24H, Naik, Kushal D, RPH, Stopped at 11/16/18 2127   Chlorhexidine Gluconate Cloth 2 % PADS 6 each, 6 each, Topical, Q0600, Penninger, Ria Comment, PA   cinacalcet (SENSIPAR) tablet 60 mg, 60 mg, Oral, Q supper, Penninger, [place], PA, 60 mg at 11/16/18 1747   Darbepoetin Alfa (ARANESP) injection 60 mcg, 60 mcg, Intravenous, Q Fri-HD, Penninger, Lindsay, PA, 60 mcg at 11/16/18 1440   doxercalciferol (HECTOROL) injection 9 mcg, 9 mcg, Intravenous, Q M,W,F-HD, Penninger, Ria Comment, PA   feeding supplement (PRO-STAT SUGAR FREE 64) liquid 30 mL, 30 mL, Oral, BID, Penninger, Lindsay, PA, 30 mL at 11/17/18 0843   insulin aspart (novoLOG) injection 0-9 Units, 0-9 Units, Subcutaneous, TID WC, Blount, Xenia T, NP, 1 Units at 11/17/18 3825   MEDLINE mouth rinse, 15 mL, Mouth Rinse, BID, Danford, Suann Larry, MD, 15 mL at 11/17/18 0844   multivitamin (RENA-VIT) tablet 1 tablet, 1 tablet, Oral, QHS, Penninger, Lindsay, PA, 1 tablet at 11/16/18 2238   ondansetron (ZOFRAN) injection 4 mg,  4 mg, Intravenous, Q6H PRN, Shela Leff, MD, 4 mg at 11/15/18 1831   sodium chloride flush (NS) 0.9 % injection 5 mL, 5 mL, Intracatheter, Q8H, Sandi Mariscal, MD, 5 mL at 11/17/18 0539  Labs: CBC Recent Labs    11/16/18 0245 11/17/18 0247  WBC 8.1 7.2  HGB 8.9* 9.6*  HCT 28.1* 29.8*  PLT 107* 128*   BMET Recent Labs    11/16/18 0245 11/17/18 0247  NA 135 136  K 3.9 3.7  CL 97* 96*  CO2 25 25  GLUCOSE 109* 168*  BUN 30* 21*  CREATININE 4.58* 3.76*  CALCIUM 8.5* 8.4*   LFT Recent Labs    11/17/18 0247  PROT 6.5  ALBUMIN 2.3*  AST 45*  ALT 99*  ALKPHOS 815*  BILITOT 6.6*   PT/INR Recent Labs    11/14/18 1702  LABPROT 12.7  INR 1.0     Studies/Results: Ct Image Guided Drainage By Percutaneous Catheter  Result Date: 11/16/2018 INDICATION: Post cholecystectomy, now with indeterminate fluid collection with the gallbladder fossa. Please perform image guided aspiration and/or drainage catheter placement. EXAM: CT IMAGE GUIDED DRAINAGE BY PERCUTANEOUS CATHETER COMPARISON:  CT abdomen and pelvis-11/15/2018; MRCP-11/15/2018 MEDICATIONS: The patient is currently admitted to the hospital and receiving intravenous antibiotics. The antibiotics were administered within an appropriate time frame prior to the initiation of the procedure. ANESTHESIA/SEDATION: Moderate (conscious) sedation was employed during this procedure. A total of Versed 1 mg and Fentanyl 25 mcg  was administered intravenously. Moderate Sedation Time: 14 minutes. The patient's level of consciousness and vital signs were monitored continuously by radiology nursing throughout the procedure under my direct supervision. CONTRAST:  None COMPLICATIONS: None immediate. PROCEDURE: Informed written consent was obtained from the patient after a discussion of the risks, benefits and alternatives to treatment. The patient was placed supine on the CT gantry and a pre procedural CT was performed re-demonstrating the known  abscess/fluid collection within the gallbladder fossa with dominant ill-defined component measuring approximately 4.0 x 2.1 cm (image 44, series 2). The procedure was planned. A timeout was performed prior to the initiation of the procedure. The skin overlying the right upper abdominal quadrant was prepped and draped in the usual sterile fashion. The overlying soft tissues were anesthetized with 1% lidocaine with epinephrine. Under direct ultrasound guidance, gallbladder fossa fluid collection was accessed with a 18 gauge trocar needle. A short Amplatz wire was coiled within the collection and appropriate positioning was confirmed with CT imaging. Next, appropriate the tract was serially dilated allowing placement of a 10 [place] all-purpose drainage catheter. Appropriate positioning was confirmed with a limited postprocedural CT scan. Approximately 5 cc of purulent fluid was aspirated. The tube was connected to a JP bulb and sutured in place. A dressing was placed. The patient tolerated the procedure well without immediate post procedural complication. IMPRESSION: Successful CT guided placement of a 10 French all purpose drain catheter into the gallbladder fossa fluid collection with aspiration of 5 mL of purulent fluid. Samples were sent to the laboratory as requested by the ordering clinical team. Electronically Signed   By: Sandi Mariscal M.D.   On: 11/16/2018 09:32    Assessment/Plan: S/p gb fossa drain for post op fluid collection Check cultures, but output looks serous. Will d/w surgical team timing of removal once fluid output minimal. IR following.    LOS: 3 days   I spent a total of 15 minutes in face to face in clinical consultation, greater than 50% of which was counseling/coordinating care for drain  Ascencion Dike PA-C 11/17/2018 10:33 AM

## 2018-11-17 NOTE — Progress Notes (Addendum)
PROGRESS NOTE    Edgar Brewer  UUV:[id number] DOB: 08-17-1960 DOA: 11/14/2018 PCP: Vassie Moment, MD      Brief Narrative:  [name] is a 58 y.o. M with ESRD on HD, DM, HTN, dCHF, pHTN, and recent choledocholithiasis with lap chole on 10/04/18 who presented with 1 day new confusion and fever and RUQ pain.  Patient had presented 3 weeks prior to this admission, 1 week post-op with N/V and confusion, found to have a 3cm fluid collection adjacent to liver.  HIDA negative for leak, Gen Surg consulted, treated conservatively with empiric antibiotics and this was thought to be an uninfected post-op seroma.  Since discharge, has had persistent nausea, vomiting, malaise, no real change.  Now fatigue worse, confused, feverish.  In the ER, temp 103F, tachycardic, encphealopathic, SpO2 88%.  AST 187, Tbili 7.0.  CT shows 4x3x3 cm abscess.  BCID with Klebsiella, enterococci and E coli.        Assessment & Plan:  Sepsis from cholangitis Klebsiella, E-Coli and enterococcus bacteremia from cholangitis   S/p gallbladder fossa percutaneous aspiration and drainage Presented with Charcot's triad as well as AMS and acute hypoxic respiratory failure from sepsis with tachycardia and fever.  CT showed apparent infection of post-op GB seroma.    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  CT chest shows patchy opacity, favor atelectasis.   Sepsis physiology resolved.  Blood cultures growing E-Coli, klebsiella and GPC's.  Minimal drain output last 24 hours -TEE planned by ID, request sent to Cardiology -Continue Unasyn -Appreciate ID expertise -IR following, appreciate assistance with GB drain.   -monitor GB drain output > limited output 10/24.   -will need to determine timing of GB drain removal, if any further imaging needed prior to removal of drain     Hemosiderosis Elevated ferritin Cholestatic LFTs pattern Ferritin elevation  had been noted previously.  MRCP shows hemosiderosis.  Synthetic funtion near normal: albumin, INR near normal.  Ammonia only minimally elevated, doubt hepatic encephalopathy I assume this is cholangitis or cholestasis from an infected seroma.   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  Actually had neuropsych testing for dementia as part of renal transplant work up, that mother says was normal.    This is now resolved to normal, patient has some slowed affect at baseline   ESRD -maintenance HD per Nehprology, appreciate assistance -Continue Renvela, Cinacalcet, Hectorol   Hypertension Chronic diastolic CHF Pulmonary hypertension EF normal in October 2020.   BP normal here  Diabetes Glucoses good -Hold home tradjenta, gabapentin  -Continue SS insulin corrections  Anemia of CKD Baseline Hgb 9-11 -trend CBC, monitor for bleeding       DVT prophylaxis: SCDs Code Status: FULL Family Communication:  Patient updated on plan of care 10/24.     Consultants:   IR  Gen Surg  ID  Procedures:   10/21 CT abdomen >> mild biliary ductal dilatation, new CBG dilatation measuring up to 0.2 cm with mild surrounding fat stranding, could be due to choledocholithiasis    10/24 ECHO >> LVEF 55-60%, normal LV function, elevated LA / LVEDP, impaired diastolic filling, global RV normal systolic function, moderate thickening of the mitral valve leaflets, mild MR, tricuspid valve trivial regurgitation, mild AV sclerosis without stenosis without stenosis, moderately elevated PASP, no  evidence of gross valvular vegetations  10/22 CT Chest >> mild patchy lower airspace disease, L>R, suspect atelectasis    Antimicrobials:   Ampicillin 10/22 >> 10/23  Ceftriaxone 10/22 >>  Flagyl 10/22 >> 10/23  Unasyn 10/22 >>   Culture data:   10/21  BCID >> enterobacteriaceae, e-coli and klebsiella detected   10/21 blood culture x2 >> E-Coli, klebsiella pneumoniae, GPC's >>    10/22 blood culture x2 >>   10/23 GB foss aspirate culture >> similar polymicrobial to 10/21 bcx, but reincubated  Subjective: RUQ pain persists.  No fever, chills, SOB, swelling, confusion, eakness, vomiting.      Objective: Vitals:   11/17/18 0005 11/17/18 0100 11/17/18 0427 11/17/18 0429  BP:  (!) 149/73  (!) 148/75  Pulse: 87 87  87  Resp: (!) 21 20  (!) 22  Temp:   98.1 F (36.7 C)   TempSrc:   Oral   SpO2: 97% 97%  98%  Weight:        Intake/Output Summary (Last 24 hours) at 11/17/2018 0752 Last data filed at 11/17/2018 0630 Gross per 24 hour  Intake 626.48 ml  Output 1603 ml  Net -976.52 ml   Filed Weights   11/16/18 1215 11/16/18 1624  Weight: 88.2 kg 86.8 kg    Examination: General appearance: Adult male, lying in bed, no acute distress, interactive HEENT: Jaundice and icterus improved.  Lids and lashes normal, oropharynx moist, no oral lesions, hearing normal. SKIN: Jaundice, no rashes or lesions  Cardiac: RRR, no murmurs appreciated, no lower extremity edema, JVP normal Respiratory: Respirations normal, lungs clear without rales or wheezing. Abdomen: Abdomen soft, tenderness palpation right upper quadrant, right JP drain in place with scant drainage.  Serosanguineous. MSK: no acute joint deformities or effusions   Neuro: Wake alert, extraocular movements intact, moves all extremities with full strength and coordination, speech fluent. Psych: Calm, attention normal, psychomotor slowing noted, affect flat, judgment and insight appear normal.   Data Reviewed: I have personally reviewed following labs and imaging studies:  CBC: Recent Labs  Lab 11/14/18 1702 11/15/18 1210 11/16/18 0245 11/17/18 0247  WBC 7.7 12.1* 8.1 7.2  NEUTROABS 7.4  --   --   --   HGB 10.6* 9.2* 8.9* 9.6*  HCT 34.2* 29.3* 28.1* 29.8*  MCV 92.9  92.7 92.4 90.0  PLT 190 104* 107* 998*   Basic Metabolic Panel: Recent Labs  Lab 11/14/18 1702 11/16/18 0245 11/17/18 0247  NA 133* 135 136  K 3.9 3.9 3.7  CL 95* 97* 96*  CO2 26 25 25   GLUCOSE 323* 109* 168*  BUN 32* 30* 21*  CREATININE 4.43* 4.58* 3.76*  CALCIUM 8.7* 8.5* 8.4*   GFR: Estimated Creatinine Clearance: 22.7 mL/min (A) (by C-G formula based on SCr of 3.76 mg/dL (H)). Liver Function Tests: Recent Labs  Lab 11/14/18 1702 11/16/18 0245 11/17/18 0247  AST 169* 81* 45*  ALT 187* 131* 99*  ALKPHOS 1,114* 746* 815*  BILITOT 7.0* 6.5* 6.6*  PROT 6.6 5.9* 6.5  ALBUMIN 3.1* 2.3* 2.3*   No results for input(s): LIPASE, AMYLASE in the last 168 hours. Recent Labs  Lab 11/15/18 0242  AMMONIA 38*   Coagulation Profile: Recent Labs  Lab 11/14/18 1702  INR 1.0   Cardiac Enzymes: No results for input(s): CKTOTAL, CKMB, CKMBINDEX, TROPONINI in the last 168 hours. BNP (last 3 results) No results for input(s): PROBNP in the last 8760 hours. HbA1C: Recent Labs    11/15/18 0242  HGBA1C 7.3*  CBG: Recent Labs  Lab 11/16/18 0741 11/16/18 1200 11/16/18 1718 11/16/18 2006 11/16/18 2220  GLUCAP 121* 132* 91 124* 149*   Lipid Profile: No results for input(s): CHOL, HDL, LDLCALC, TRIG, CHOLHDL, LDLDIRECT in the last 72 hours. Thyroid Function Tests: No results for input(s): TSH, T4TOTAL, FREET4, T3FREE, THYROIDAB in the last 72 hours. Anemia Panel: No results for input(s): VITAMINB12, FOLATE, FERRITIN, TIBC, IRON, RETICCTPCT in the last 72 hours. Urine analysis:    Component Value Date/Time   COLORURINE AMBER (A) 10/19/2018 0501   APPEARANCEUR CLEAR 10/19/2018 0501   LABSPEC 1.028 10/19/2018 0501   PHURINE 6.0 10/19/2018 0501   GLUCOSEU >=500 (A) 10/19/2018 0501   HGBUR SMALL (A) 10/19/2018 0501   BILIRUBINUR NEGATIVE 10/19/2018 0501   KETONESUR NEGATIVE 10/19/2018 0501   PROTEINUR >=300 (A) 10/19/2018 0501   NITRITE NEGATIVE 10/19/2018 0501    LEUKOCYTESUR NEGATIVE 10/19/2018 0501   Sepsis Labs: @LABRCNTIP (procalcitonin:4,lacticacidven:4)  ) Recent Results (from the past 240 hour(s))  Culture, blood (Routine x 2)     Status: None (Preliminary result)   Collection Time: 11/14/18  5:03 PM   Specimen: BLOOD  Result Value Ref Range Status   Specimen Description BLOOD RIGHT ANTECUBITAL  Final   Special Requests   Final    BOTTLES DRAWN AEROBIC AND ANAEROBIC Blood Culture results may not be optimal due to an inadequate volume of blood received in culture bottles   Culture  Setup Time   Final    ANAEROBIC BOTTLE ONLY GRAM NEGATIVE RODS GRAM POSITIVE COCCI Organism ID to follow AEROBIC BOTTLE ONLY GRAM NEGATIVE RODS GRAM POSITIVE COCCI CRITICAL RESULT CALLED TO, READ BACK BY AND VERIFIED WITH: G ABBOTT PHARMD 11/15/18 0601 JDW    Culture   Final    CULTURE REINCUBATED FOR BETTER GROWTH Performed at [facility], [place] [address], [place], [place] [address]    Report Status PENDING  Incomplete  Blood Culture ID Panel (Reflexed)     Status: Abnormal   Collection Time: 11/14/18  5:03 PM  Result Value Ref Range Status   Enterococcus species DETECTED (A) NOT DETECTED Final    Comment: CRITICAL RESULT CALLED TO, READ BACK BY AND VERIFIED WITH: G ABBOTT PHARMD 11/15/18 0601 JDW    Vancomycin resistance NOT DETECTED NOT DETECTED Final   Listeria monocytogenes NOT DETECTED NOT DETECTED Final   Staphylococcus species NOT DETECTED NOT DETECTED Final   Staphylococcus aureus (BCID) NOT DETECTED NOT DETECTED Final   Streptococcus species NOT DETECTED NOT DETECTED Final   Streptococcus agalactiae NOT DETECTED NOT DETECTED Final   Streptococcus pneumoniae NOT DETECTED NOT DETECTED Final   Streptococcus pyogenes NOT DETECTED NOT DETECTED Final   Acinetobacter baumannii NOT DETECTED NOT DETECTED Final   Enterobacteriaceae species DETECTED (A) NOT DETECTED Final    Comment: CRITICAL RESULT CALLED TO, READ BACK BY AND VERIFIED WITH: G  ABBOTT PHARMD 11/15/18 0601 JDW    Enterobacter cloacae complex NOT DETECTED NOT DETECTED Final   Escherichia coli DETECTED (A) NOT DETECTED Final    Comment: CRITICAL RESULT CALLED TO, READ BACK BY AND VERIFIED WITH: G ABBOTT PHARMD 11/15/18 0601 JDW    Klebsiella oxytoca NOT DETECTED NOT DETECTED Final   Klebsiella pneumoniae DETECTED (A) NOT DETECTED Final    Comment: CRITICAL RESULT CALLED TO, READ BACK BY AND VERIFIED WITH: G ABBOTT PHARMD 11/15/18 0601 JDW    Proteus species NOT DETECTED NOT DETECTED Final   Serratia marcescens NOT DETECTED NOT DETECTED Final   Carbapenem resistance NOT DETECTED  NOT DETECTED Final   Haemophilus influenzae NOT DETECTED NOT DETECTED Final   Neisseria meningitidis NOT DETECTED NOT DETECTED Final   Pseudomonas aeruginosa NOT DETECTED NOT DETECTED Final   Candida albicans NOT DETECTED NOT DETECTED Final   Candida glabrata NOT DETECTED NOT DETECTED Final   Candida krusei NOT DETECTED NOT DETECTED Final   Candida parapsilosis NOT DETECTED NOT DETECTED Final   Candida tropicalis NOT DETECTED NOT DETECTED Final    Comment: Performed at [facility], [place] [address], [place], [place] [address]  Culture, blood (Routine x 2)     Status: None (Preliminary result)   Collection Time: 11/14/18  6:40 PM   Specimen: BLOOD RIGHT FOREARM  Result Value Ref Range Status   Specimen Description BLOOD RIGHT FOREARM  Final   Special Requests   Final    BOTTLES DRAWN AEROBIC AND ANAEROBIC Blood Culture results may not be optimal due to an inadequate volume of blood received in culture bottles   Culture  Setup Time   Final    IN BOTH AEROBIC AND ANAEROBIC BOTTLES GRAM POSITIVE COCCI GRAM NEGATIVE RODS CRITICAL VALUE NOTED.  VALUE IS CONSISTENT WITH PREVIOUSLY REPORTED AND CALLED VALUE.    Culture   Final    CULTURE REINCUBATED FOR BETTER GROWTH Performed at [facility], [place] [address] [place], [place] [address]    Report Status PENDING  Incomplete   SARS CORONAVIRUS 2 (TAT 6-24 HRS) Nasopharyngeal Nasopharyngeal Swab     Status: None   Collection Time: 11/14/18 10:33 PM   Specimen: Nasopharyngeal Swab  Result Value Ref Range Status   SARS Coronavirus 2 NEGATIVE NEGATIVE Final    Comment: (NOTE) SARS-CoV-2 target nucleic acids are NOT DETECTED. The SARS-CoV-2 RNA is generally detectable in upper and lower respiratory specimens during the acute phase of infection. Negative results do not preclude SARS-CoV-2 infection, do not rule out co-infections with other pathogens, and should not be used as the sole basis for treatment or other patient management decisions. Negative results must be combined with clinical observations, patient history, and epidemiological information. The expected result is Negative. Fact Sheet for Patients: SugarRoll.be Fact Sheet for Healthcare Providers: https://www.woods-mathews.com/ This test is not yet approved or cleared by the [place] FDA and  has been authorized for detection and/or diagnosis of SARS-CoV-2 by FDA under an Emergency Use Authorization (EUA). This EUA will remain  in effect (meaning this test can be used) for the duration of the COVID-19 declaration under Section 56 4(b)(1) of the Act, 21 U.S.C. section 360bbb-3(b)(1), unless the authorization is terminated or revoked sooner. Performed at [facility], [place] [address], [place], [place] [address]   MRSA PCR Screening     Status: None   Collection Time: 11/15/18  3:57 PM   Specimen: Nasopharyngeal  Result Value Ref Range Status   MRSA by PCR NEGATIVE NEGATIVE Final    Comment:        The GeneXpert MRSA Assay (FDA approved for NASAL specimens only), is one component of a comprehensive MRSA colonization surveillance program. It is not intended to diagnose MRSA infection nor to guide or monitor treatment for MRSA infections. Performed at [facility], [place] [address]  [place],  [address]   Culture, blood (routine x 2)     Status: None (Preliminary result)   Collection Time: 11/15/18  5:30 PM   Specimen: BLOOD  Result Value Ref Range Status   Specimen Description BLOOD RIGHT ANTECUBITAL  Final  Special Requests   Final    BOTTLES DRAWN AEROBIC ONLY Blood Culture adequate volume   Culture   Final    NO GROWTH 2 DAYS Performed at [facility], [place] [address], [place], [place] [address]    Report Status PENDING  Incomplete  Culture, blood (routine x 2)     Status: None (Preliminary result)   Collection Time: 11/15/18  5:47 PM   Specimen: BLOOD RIGHT HAND  Result Value Ref Range Status   Specimen Description BLOOD RIGHT HAND  Final   Special Requests   Final    BOTTLES DRAWN AEROBIC ONLY Blood Culture adequate volume   Culture   Final    NO GROWTH 2 DAYS Performed at [facility], [place] [address], [place], [place] [address]    Report Status PENDING  Incomplete  Aerobic/Anaerobic Culture (surgical/deep wound)     Status: None (Preliminary result)   Collection Time: 11/16/18  9:18 AM   Specimen: Abscess  Result Value Ref Range Status   Specimen Description ABSCESS GALL BLADDER  Final   Special Requests Normal  Final   Gram Stain   Final    FEW WBC PRESENT,BOTH PMN AND MONONUCLEAR RARE GRAM POSITIVE COCCI IN PAIRS RARE GRAM VARIABLE ROD Performed at [facility], [place] [address] [place], [place] [address]    Culture PENDING  Incomplete   Report Status PENDING  Incomplete         Radiology Studies: Ct Image Guided Drainage By Percutaneous Catheter  Result Date: 11/16/2018 INDICATION: Post cholecystectomy, now with indeterminate fluid collection with the gallbladder fossa. Please perform image guided aspiration and/or drainage catheter placement. EXAM: CT IMAGE GUIDED DRAINAGE BY PERCUTANEOUS CATHETER COMPARISON:  CT abdomen and pelvis-11/15/2018; MRCP-11/15/2018 MEDICATIONS: The patient is currently admitted to the  hospital and receiving intravenous antibiotics. The antibiotics were administered within an appropriate time frame prior to the initiation of the procedure. ANESTHESIA/SEDATION: Moderate (conscious) sedation was employed during this procedure. A total of Versed 1 mg and Fentanyl 25 mcg was administered intravenously. Moderate Sedation Time: 14 minutes. The patient's level of consciousness and vital signs were monitored continuously by radiology nursing throughout the procedure under my direct supervision. CONTRAST:  None COMPLICATIONS: None immediate. PROCEDURE: Informed written consent was obtained from the patient after a discussion of the risks, benefits and alternatives to treatment. The patient was placed supine on the CT gantry and a pre procedural CT was performed re-demonstrating the known abscess/fluid collection within the gallbladder fossa with dominant ill-defined component measuring approximately 4.0 x 2.1 cm (image 44, series 2). The procedure was planned. A timeout was performed prior to the initiation of the procedure. The skin overlying the right upper abdominal quadrant was prepped and draped in the usual sterile fashion. The overlying soft tissues were anesthetized with 1% lidocaine with epinephrine. Under direct ultrasound guidance, gallbladder fossa fluid collection was accessed with a 18 gauge trocar needle. A short Amplatz wire was coiled within the collection and appropriate positioning was confirmed with CT imaging. Next, appropriate the tract was serially dilated allowing placement of a 10 [place] all-purpose drainage catheter. Appropriate positioning was confirmed with a limited postprocedural CT scan. Approximately 5 cc of purulent fluid was aspirated. The tube was connected to a JP bulb and sutured in place. A dressing was placed. The patient tolerated the procedure well without immediate post procedural complication. IMPRESSION: Successful CT guided placement of a 10 French all purpose  drain catheter into the gallbladder fossa  fluid collection with aspiration of 5 mL of purulent fluid. Samples were sent to the laboratory as requested by the ordering clinical team. Electronically Signed   By: Sandi Mariscal M.D.   On: 11/16/2018 09:32        Scheduled Meds: . Chlorhexidine Gluconate Cloth  6 each Topical Q0600  . cinacalcet  60 mg Oral Q supper  . darbepoetin (ARANESP) injection - DIALYSIS  60 mcg Intravenous Q Fri-HD  . doxercalciferol  9 mcg Intravenous Q M,W,F-HD  . feeding supplement (PRO-STAT SUGAR FREE 64)  30 mL Oral BID  . insulin aspart  0-9 Units Subcutaneous TID WC  . mouth rinse  15 mL Mouth Rinse BID  . multivitamin  1 tablet Oral QHS  . sodium chloride flush  5 mL Intracatheter Q8H   Continuous Infusions: . sodium chloride Stopped (11/16/18 2242)  . ampicillin-sulbactam (UNASYN) IV Stopped (11/16/18 2127)     LOS: 3 days    Time spent: 35 minutes    Noe Gens, AG-ACNP-S [facility] 11/17/2018, 7:52 AM     Please page through [place]:  [email or web address] Password [email or web address] If 7PM-7AM, please contact night-coverage    Attending MD Note:  I have seen and examined the patient with nurse practitioner/physician assistant and agree with the note above which has been edited to reflect our agreed upon history, exam, and assessment/plan.   I have personally reviewed the orders for the patient, which were made under my direction.       [place]

## 2018-11-17 NOTE — Progress Notes (Signed)
  Echocardiogram 2D Echocardiogram has been performed.  Bobbye Charleston 11/17/2018, 8:44 AM

## 2018-11-17 NOTE — Progress Notes (Addendum)
ID PROGRESS NOTE  Afebrile, improved encephalopathic  Cultures gb aspiration = reincubation growth Blood cx = polymicrobial with ecoli, kleb plus enterococcus   Plan: Continue with amp/sub to cover all 3 pathogens  needs TEE to rule out endocarditis associated with enterococcus Plan to treat for minimum of 2 wk due to polymicrobial bacteremia from gi source  Hyperbilirubinemia =slow to improve, continue to check LFTs  Clema Skousen B. [facility] Infectious [facility] [phone]

## 2018-11-18 LAB — CBC
HCT: 31.7 % — ABNORMAL LOW (ref 39.0–52.0)
Hemoglobin: 10.4 g/dL — ABNORMAL LOW (ref 13.0–17.0)
MCH: 29 pg (ref 26.0–34.0)
MCHC: 32.8 g/dL (ref 30.0–36.0)
MCV: 88.3 fL (ref 80.0–100.0)
Platelets: 175 10*3/uL (ref 150–400)
RBC: 3.59 MIL/uL — ABNORMAL LOW (ref 4.22–5.81)
RDW: 15.1 % (ref 11.5–15.5)
WBC: 7.3 10*3/uL (ref 4.0–10.5)
nRBC: 0 % (ref 0.0–0.2)

## 2018-11-18 LAB — COMPREHENSIVE METABOLIC PANEL
ALT: 73 U/L — ABNORMAL HIGH (ref 0–44)
AST: 28 U/L (ref 15–41)
Albumin: 2.3 g/dL — ABNORMAL LOW (ref 3.5–5.0)
Alkaline Phosphatase: 863 U/L — ABNORMAL HIGH (ref 38–126)
Anion gap: 16 — ABNORMAL HIGH (ref 5–15)
BUN: 41 mg/dL — ABNORMAL HIGH (ref 6–20)
CO2: 24 mmol/L (ref 22–32)
Calcium: 8.5 mg/dL — ABNORMAL LOW (ref 8.9–10.3)
Chloride: 95 mmol/L — ABNORMAL LOW (ref 98–111)
Creatinine, Ser: 4.96 mg/dL — ABNORMAL HIGH (ref 0.61–1.24)
GFR calc Af Amer: 14 mL/min — ABNORMAL LOW (ref >60)
GFR calc non Af Amer: 12 mL/min — ABNORMAL LOW (ref >60)
Glucose, Bld: 166 mg/dL — ABNORMAL HIGH (ref 70–99)
Potassium: 3.3 mmol/L — ABNORMAL LOW (ref 3.5–5.1)
Sodium: 135 mmol/L (ref 135–145)
Total Bilirubin: 6.3 mg/dL — ABNORMAL HIGH (ref 0.3–1.2)
Total Protein: 6.4 g/dL — ABNORMAL LOW (ref 6.5–8.1)

## 2018-11-18 LAB — URINALYSIS, ROUTINE W REFLEX MICROSCOPIC
Glucose, UA: >=500 mg/dL — AB
Hgb urine dipstick: NEGATIVE
Ketones, ur: 5 mg/dL — AB
Leukocytes,Ua: NEGATIVE
Nitrite: NEGATIVE
Protein, ur: >=300 mg/dL — AB
Specific Gravity, Urine: 1.015 (ref 1.005–1.030)
pH: 6 (ref 5.0–8.0)

## 2018-11-18 LAB — GLUCOSE, CAPILLARY
Glucose-Capillary: 181 mg/dL — ABNORMAL HIGH (ref 70–99)
Glucose-Capillary: 215 mg/dL — ABNORMAL HIGH (ref 70–99)
Glucose-Capillary: 172 mg/dL — ABNORMAL HIGH (ref 70–99)

## 2018-11-18 MED ORDER — LINAGLIPTIN 5 MG PO TABS
5.0000 mg | ORAL_TABLET | Freq: Every day | ORAL | Status: DC
  Administered 2018-11-18 – 2018-11-20 (×2): 5 mg via ORAL
  Filled 2018-11-18 (×2): qty 1

## 2018-11-18 MED ORDER — GABAPENTIN 300 MG PO CAPS
300.0000 mg | ORAL_CAPSULE | Freq: Two times a day (BID) | ORAL | Status: DC
  Administered 2018-11-18 – 2018-11-20 (×3): 300 mg via ORAL
  Filled 2018-11-18 (×4): qty 1

## 2018-11-18 MED ORDER — GABAPENTIN 600 MG PO TABS: 300.0000 mg | ORAL_TABLET | Freq: Two times a day (BID) | ORAL | Status: DC

## 2018-11-18 NOTE — Progress Notes (Signed)
PROGRESS NOTE    Edgar Brewer  LNL:[id number] DOB: 03/24/1960 DOA: 11/14/2018 PCP: Vassie Moment, MD      Brief Narrative:  Edgar Brewer is a 58 y.o. M with ESRD on HD, DM, HTN, dCHF, pHTN, and recent choledocholithiasis with lap chole on 10/04/18 who presented with 1 day new confusion and fever and RUQ pain.  Patient had presented 3 weeks prior to this admission, 1 week post-op with N/V and confusion, found to have a 3cm fluid collection adjacent to liver.  HIDA negative for leak, Gen Surg consulted, treated conservatively with empiric antibiotics and this was thought to be an uninfected post-op seroma.  Since discharge, has had persistent nausea, vomiting, malaise, no real change.  Now fatigue worse, confused, feverish.  In the ER, temp 103F, tachycardic, encphealopathic, SpO2 88%.  AST 187, Tbili 7.0.  CT shows 4x3x3 cm abscess.  BCID with Klebsiella, enterococci and E coli.        Assessment & Plan:  Sepsis from cholangitis Klebsiella, E-Coli and enterococcus bacteremia from cholangitis   S/p gallbladder fossa percutaneous aspiration and drainage Presented with Charcot's triad as well as AMS and acute hypoxic respiratory failure from sepsis with tachycardia and fever.  CT showed apparent infection of post-op GB seroma.    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  CT chest shows patchy opacity, favor atelectasis.   Sepsis physiology resolved.  Blood cultures growing E-Coli, klebsiella (both Unasyn susceptible) and Enterococcus.  WBC normal, afebrile. Drain output 25 cc yesterday. -TEE tomorrow -Continue Unasyn -Appreciate ID guidance -Consult IR appreciate assistance with GB drain.    .   -will need to determine timing of GB drain removal, if any further imaging needed prior to removal of drain     Hemosiderosis Elevated ferritin Cholestatic LFTs pattern Ferritin elevation had been noted previously.  MRCP  shows hemosiderosis.  Synthetic funtion near normal: albumin, INR near normal.  Ammonia only minimally elevated, doubt hepatic encephalopathy I assume this is cholangitis or cholestasis from an infected seroma. 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  Actually had neuropsych testing for dementia as part of renal transplant work up, that mother says was normal.    This is now resolved to normal, patient has some slowed affect at baseline   ESRD -Maintenance HD per Nehprology, appreciate assistance -Continue Renvela, Cinacalcet, Hectorol   Hypertension Chronic diastolic CHF Pulmonary hypertension EF normal in October 2020.   BP elevated  Diabetes Glucoses slightly elevated -Restart home tradjenta, gabapentin  -Continue SS insulin corrections  Anemia of CKD Baseline Hgb 9-11, stable relative to baseline        DVT prophylaxis: SCDs Code Status: FULL Family Communication: Called mother, no answer    Consultants:   IR  Gen Surg  ID  Procedures:   10/21 CT abdomen >> mild biliary ductal dilatation, new CBG dilatation measuring up to 0.2 cm with mild surrounding fat stranding, could be due to choledocholithiasis    10/24 ECHO >> LVEF 55-60%, normal LV function, elevated LA / LVEDP, impaired diastolic filling, global RV normal systolic function, moderate thickening of the mitral valve leaflets, mild MR, tricuspid valve trivial regurgitation, mild AV sclerosis without stenosis without stenosis, moderately elevated PASP, no evidence of gross  valvular vegetations  10/22 CT Chest >> mild patchy lower airspace disease, L>R, suspect atelectasis    Antimicrobials:   Ampicillin 10/22 >> 10/23  Ceftriaxone 10/22 >>  Flagyl 10/22 >> 10/23  Unasyn 10/22  >>   Culture data:   10/21 blood culture x2 >> E-Coli, klebsiella pneumoniae, e faecalis    10/22 blood culture x2 >> ng  10/23 GB foss aspirate culture >> similar polymicrobial to 10/21 bcx, but reincubated  Subjective: Complains of diarrhea overnight, some dry heaving this morning.  No fever, chills, shortness of breath, swelling, confusion.        Objective: Vitals:   11/18/18 0411 11/18/18 0807 11/18/18 1207 11/18/18 1211  BP:  (!) 146/71  (!) 156/73  Pulse:  84  92  Resp:  18  14  Temp: 98.7 F (37.1 C) 97.7 F (36.5 C) 98.1 F (36.7 C)   TempSrc: Oral Oral Oral   SpO2: 94% 96%  95%  Weight: 86.6 kg       Intake/Output Summary (Last 24 hours) at 11/18/2018 1612 Last data filed at 11/18/2018 1337 Gross per 24 hour  Intake 1045 ml  Output 125 ml  Net 920 ml   Filed Weights   11/16/18 1215 11/16/18 1624 11/18/18 0411  Weight: 88.2 kg 86.8 kg 86.6 kg    Examination: General appearance: Adult male, lying in bed, no acute distress, interactive. HEENT: Jaundice and icterus resolved.  Lids and lashes normal.  Oropharynx moist, no oral lesions, hearing normal, lips normal, teeth in good repair. SKIN: Mild jaundice, no rashes or lesions. Cardiac: RRR, no murmurs, no lower extremity edema. Respiratory: Respiratory rate normal, lungs clear without rales or wheezes.   Abdomen: Abdomen soft, mild tenderness to palpation the right upper quadrant, no guarding.  JP drain with scant serosanguineous drainage. MSK: no acute joint deformities or effusions   Neuro: Awake and alert, extraocular movements intact, moves all extremities with normal strength and coordination, speech fluent. Psych: Attention normal, psychomotor slowing noted, affect flat, judgment insight appear normal.     Data Reviewed: I have personally reviewed following labs and imaging studies:  CBC: Recent Labs  Lab 11/14/18 1702 11/15/18 1210 11/16/18 0245 11/17/18 0247 11/18/18 0241  WBC 7.7  12.1* 8.1 7.2 7.3  NEUTROABS 7.4  --   --   --   --   HGB 10.6* 9.2* 8.9* 9.6* 10.4*  HCT 34.2* 29.3* 28.1* 29.8* 31.7*  MCV 92.9 92.7 92.4 90.0 88.3  PLT 190 104* 107* 128* 678   Basic Metabolic Panel: Recent Labs  Lab 11/14/18 1702 11/16/18 0245 11/17/18 0247 11/18/18 0241  NA 133* 135 136 135  K 3.9 3.9 3.7 3.3*  CL 95* 97* 96* 95*  CO2 _0 GLUCOSE 323* 109* 168* 166*  BUN 32* 30* 21* 41*  CREATININE 4.43* 4.58* 3.76* 4.96*  CALCIUM 8.7* 8.5* 8.4* 8.5*   GFR: Estimated Creatinine Clearance: 17.2 mL/min (A) (by C-G formula based on SCr of 4.96 mg/dL (H)). Liver Function Tests: Recent Labs  Lab 11/14/18 1702 11/16/18 0245 11/17/18 0247 11/18/18 0241  AST 169* 81* 45* 28  ALT 187* 131* 99* 73*  ALKPHOS 1,114* 746* 815* 863*  BILITOT 7.0* 6.5* 6.6* 6.3*  PROT 6.6 5.9* 6.5 6.4*  ALBUMIN 3.1* 2.3* 2.3* 2.3*   No results for input(s): LIPASE, AMYLASE in the last 168 hours. Recent Labs  Lab 11/15/18 0242  AMMONIA 38*   Coagulation Profile: Recent Labs  Lab 11/14/18 1702  INR 1.0  Cardiac Enzymes: No results for input(s): CKTOTAL, CKMB, CKMBINDEX, TROPONINI in the last 168 hours. BNP (last 3 results) No results for input(s): PROBNP in the last 8760 hours. HbA1C: No results for input(s): HGBA1C in the last 72 hours. CBG: Recent Labs  Lab 11/17/18 1158 11/17/18 1720 11/17/18 2140 11/18/18 0805 11/18/18 1213  GLUCAP 182* 188* 219* 181* 215*   Lipid Profile: No results for input(s): CHOL, HDL, LDLCALC, TRIG, CHOLHDL, LDLDIRECT in the last 72 hours. Thyroid Function Tests: No results for input(s): TSH, T4TOTAL, FREET4, T3FREE, THYROIDAB in the last 72 hours. Anemia Panel: No results for input(s): VITAMINB12, FOLATE, FERRITIN, TIBC, IRON, RETICCTPCT in the last 72 hours. Urine analysis:    Component Value Date/Time   COLORURINE AMBER (A) 11/18/2018 0807   APPEARANCEUR CLEAR 11/18/2018 0807   LABSPEC 1.015 11/18/2018 0807   PHURINE 6.0  11/18/2018 0807   GLUCOSEU >=500 (A) 11/18/2018 0807   HGBUR NEGATIVE 11/18/2018 0807   BILIRUBINUR SMALL (A) 11/18/2018 0807   KETONESUR 5 (A) 11/18/2018 0807   PROTEINUR >=300 (A) 11/18/2018 0807   NITRITE NEGATIVE 11/18/2018 0807   LEUKOCYTESUR NEGATIVE 11/18/2018 0807   Sepsis Labs: _0 (procalcitonin:4,lacticacidven:4)  ) Recent Results (from the past 240 hour(s))  Culture, blood (Routine x 2)     Status: Abnormal (Preliminary result)   Collection Time: 11/14/18  5:03 PM   Specimen: BLOOD  Result Value Ref Range Status   Specimen Description BLOOD RIGHT ANTECUBITAL  Final   Special Requests   Final    BOTTLES DRAWN AEROBIC AND ANAEROBIC Blood Culture results may not be optimal due to an inadequate volume of blood received in culture bottles   Culture  Setup Time   Final    ANAEROBIC BOTTLE ONLY GRAM NEGATIVE RODS GRAM POSITIVE COCCI Organism ID to follow AEROBIC BOTTLE ONLY GRAM NEGATIVE RODS GRAM POSITIVE COCCI CRITICAL RESULT CALLED TO, READ BACK BY AND VERIFIED WITH: D[place] Brick [facility] 11/15/18 0601 JDW Performed at [facility], [place] [address], [place], [place] [address]    Culture (A)  Final    ESCHERICHIA COLI KLEBSIELLA PNEUMONIAE ENTEROCOCCUS FAECALIS    Report Status PENDING  Incomplete  Blood Culture ID Panel (Reflexed)     Status: Abnormal   Collection Time: 11/14/18  5:03 PM  Result Value Ref Range Status   Enterococcus species DETECTED (A) NOT DETECTED Final    Comment: CRITICAL RESULT CALLED TO, READ BACK BY AND VERIFIED WITH: G ABBOTT PHARMD 11/15/18 0601 JDW    Vancomycin resistance NOT DETECTED NOT DETECTED Final   Listeria monocytogenes NOT DETECTED NOT DETECTED Final   Staphylococcus species NOT DETECTED NOT DETECTED Final   Staphylococcus aureus (BCID) NOT DETECTED NOT DETECTED Final   Streptococcus species NOT DETECTED NOT DETECTED Final   Streptococcus agalactiae NOT DETECTED NOT DETECTED Final   Streptococcus pneumoniae NOT DETECTED  NOT DETECTED Final   Streptococcus pyogenes NOT DETECTED NOT DETECTED Final   Acinetobacter baumannii NOT DETECTED NOT DETECTED Final   Enterobacteriaceae species DETECTED (A) NOT DETECTED Final    Comment: CRITICAL RESULT CALLED TO, READ BACK BY AND VERIFIED WITH: G ABBOTT PHARMD 11/15/18 0601 JDW    Enterobacter cloacae complex NOT DETECTED NOT DETECTED Final   Escherichia coli DETECTED (A) NOT DETECTED Final    Comment: CRITICAL RESULT CALLED TO, READ BACK BY AND VERIFIED WITH: G ABBOTT PHARMD 11/15/18 0601 JDW    Klebsiella oxytoca NOT DETECTED NOT DETECTED Final   Klebsiella pneumoniae DETECTED (A) NOT DETECTED Final  Comment: CRITICAL RESULT CALLED TO, READ BACK BY AND VERIFIED WITH: G ABBOTT PHARMD 11/15/18 0601 JDW    Proteus species NOT DETECTED NOT DETECTED Final   Serratia marcescens NOT DETECTED NOT DETECTED Final   Carbapenem resistance NOT DETECTED NOT DETECTED Final   Haemophilus influenzae NOT DETECTED NOT DETECTED Final   Neisseria meningitidis NOT DETECTED NOT DETECTED Final   Pseudomonas aeruginosa NOT DETECTED NOT DETECTED Final   Candida albicans NOT DETECTED NOT DETECTED Final   Candida glabrata NOT DETECTED NOT DETECTED Final   Candida krusei NOT DETECTED NOT DETECTED Final   Candida parapsilosis NOT DETECTED NOT DETECTED Final   Candida tropicalis NOT DETECTED NOT DETECTED Final    Comment: Performed at [facility], [place] [address], [place], [place] [address]  Culture, blood (Routine x 2)     Status: Abnormal (Preliminary result)   Collection Time: 11/14/18  6:40 PM   Specimen: BLOOD RIGHT FOREARM  Result Value Ref Range Status   Specimen Description BLOOD RIGHT FOREARM  Final   Special Requests   Final    BOTTLES DRAWN AEROBIC AND ANAEROBIC Blood Culture results may not be optimal due to an inadequate volume of blood received in culture bottles   Culture  Setup Time   Final    IN BOTH AEROBIC AND ANAEROBIC BOTTLES GRAM POSITIVE COCCI GRAM NEGATIVE  RODS CRITICAL VALUE NOTED.  VALUE IS CONSISTENT WITH PREVIOUSLY REPORTED AND CALLED VALUE.    Culture (A)  Final    ESCHERICHIA COLI KLEBSIELLA PNEUMONIAE ENTEROCOCCUS FAECALIS SUSCEPTIBILITIES TO FOLLOW Performed at [facility], [place] [address], [place], [place] [address]    Report Status PENDING  Incomplete   Organism ID, Bacteria ESCHERICHIA COLI  Final   Organism ID, Bacteria KLEBSIELLA PNEUMONIAE  Final      Susceptibility   Escherichia coli - MIC*    AMPICILLIN <=2 SENSITIVE Sensitive     CEFAZOLIN <=4 SENSITIVE Sensitive     CEFEPIME <=1 SENSITIVE Sensitive     CEFTAZIDIME <=1 SENSITIVE Sensitive     CEFTRIAXONE <=1 SENSITIVE Sensitive     CIPROFLOXACIN <=0.25 SENSITIVE Sensitive     GENTAMICIN <=1 SENSITIVE Sensitive     IMIPENEM <=0.25 SENSITIVE Sensitive     TRIMETH/SULFA <=20 SENSITIVE Sensitive     AMPICILLIN/SULBACTAM <=2 SENSITIVE Sensitive     PIP/TAZO <=4 SENSITIVE Sensitive     Extended ESBL NEGATIVE Sensitive     * ESCHERICHIA COLI   Klebsiella pneumoniae - MIC*    AMPICILLIN >=32 RESISTANT Resistant     CEFAZOLIN <=4 SENSITIVE Sensitive     CEFEPIME <=1 SENSITIVE Sensitive     CEFTAZIDIME <=1 SENSITIVE Sensitive     CEFTRIAXONE <=1 SENSITIVE Sensitive     CIPROFLOXACIN <=0.25 SENSITIVE Sensitive     GENTAMICIN <=1 SENSITIVE Sensitive     IMIPENEM <=0.25 SENSITIVE Sensitive     TRIMETH/SULFA <=20 SENSITIVE Sensitive     AMPICILLIN/SULBACTAM 8 SENSITIVE Sensitive     PIP/TAZO <=4 SENSITIVE Sensitive     Extended ESBL NEGATIVE Sensitive     * KLEBSIELLA PNEUMONIAE  SARS CORONAVIRUS 2 (TAT 6-24 HRS) Nasopharyngeal Nasopharyngeal Swab     Status: None   Collection Time: 11/14/18 10:33 PM   Specimen: Nasopharyngeal Swab  Result Value Ref Range Status   SARS Coronavirus 2 NEGATIVE NEGATIVE Final    Comment: (NOTE) SARS-CoV-2 target nucleic acids are NOT DETECTED. The SARS-CoV-2 RNA is generally detectable in upper and lower respiratory specimens  during the acute phase of  infection. Negative results do not preclude SARS-CoV-2 infection, do not rule out co-infections with other pathogens, and should not be used as the sole basis for treatment or other patient management decisions. Negative results must be combined with clinical observations, patient history, and epidemiological information. The expected result is Negative. Fact Sheet for Patients: SugarRoll.be Fact Sheet for Healthcare Providers: https://www.woods-mathews.com/ This test is not yet approved or cleared by the [place] FDA and  has been authorized for detection and/or diagnosis of SARS-CoV-2 by FDA under an Emergency Use Authorization (EUA). This EUA will remain  in effect (meaning this test can be used) for the duration of the COVID-19 declaration under Section 56 4(b)(1) of the Act, 21 U.S.C. section 360bbb-3(b)(1), unless the authorization is terminated or revoked sooner. Performed at [facility], [place] [address], [place], [place] [address]   MRSA PCR Screening     Status: None   Collection Time: 11/15/18  3:57 PM   Specimen: Nasopharyngeal  Result Value Ref Range Status   MRSA by PCR NEGATIVE NEGATIVE Final    Comment:        The GeneXpert MRSA Assay (FDA approved for NASAL specimens only), is one component of a comprehensive MRSA colonization surveillance program. It is not intended to diagnose MRSA infection nor to guide or monitor treatment for MRSA infections. Performed at [facility], [place] [address] [place], [place] [address]   Culture, blood (routine x 2)     Status: None (Preliminary result)   Collection Time: 11/15/18  5:30 PM   Specimen: BLOOD  Result Value Ref Range Status   Specimen Description BLOOD RIGHT ANTECUBITAL  Final   Special Requests   Final    BOTTLES DRAWN AEROBIC ONLY Blood Culture adequate volume   Culture   Final    NO GROWTH 3 DAYS Performed at [facility], [place] [address], [place], [place] [address]    Report Status PENDING  Incomplete  Culture, blood (routine x 2)     Status: None (Preliminary result)   Collection Time: 11/15/18  5:47 PM   Specimen: BLOOD RIGHT HAND  Result Value Ref Range Status   Specimen Description BLOOD RIGHT HAND  Final   Special Requests   Final    BOTTLES DRAWN AEROBIC ONLY Blood Culture adequate volume   Culture   Final    NO GROWTH 3 DAYS Performed at [facility], [place] [address], [place], [place] [address]    Report Status PENDING  Incomplete  Aerobic/Anaerobic Culture (surgical/deep wound)     Status: Abnormal (Preliminary result)   Collection Time: 11/16/18  9:18 AM   Specimen: Abscess  Result Value Ref Range Status   Specimen Description ABSCESS GALL BLADDER  Final   Special Requests Normal  Final   Gram Stain   Final    FEW WBC PRESENT,BOTH PMN AND MONONUCLEAR RARE GRAM POSITIVE COCCI IN PAIRS RARE GRAM VARIABLE ROD Performed at [facility], [place] [address] [place], [place] [address]    Culture MULTIPLE ORGANISMS PRESENT, NONE PREDOMINANT (A)  Final   Report Status PENDING  Incomplete         Radiology Studies: No results found.      Scheduled Meds: . Chlorhexidine Gluconate Cloth  6 each Topical Q0600  . cinacalcet  60 mg Oral Q supper  . darbepoetin (ARANESP) injection - DIALYSIS  60 mcg Intravenous Q Fri-HD  . doxercalciferol  9 mcg Intravenous Q M,W,F-HD  .  feeding supplement (PRO-STAT SUGAR FREE 64)  30 mL Oral BID  . gabapentin  300 mg Oral BID  . insulin aspart  0-9 Units Subcutaneous TID WC  . linagliptin  5 mg Oral Q breakfast  . mouth rinse  15 mL Mouth Rinse BID  . multivitamin  1 tablet Oral QHS  . sodium chloride flush  5 mL Intracatheter Q8H   Continuous Infusions: . sodium chloride 10 mL/hr at 11/17/18 2220  . ampicillin-sulbactam (UNASYN) IV 3 g (11/17/18 2224)     LOS: 4 days    Time spent: 25 minutes   Shana Chute, MD  [facility] 11/18/2018, 4:12 PM     Please page through [place]:  [email or web address] Password [email or web address] If 7PM-7AM, please contact night-coverage       [employer]

## 2018-11-18 NOTE — Progress Notes (Signed)
[facility] Progress Note   MWF -NW 4.25hrs, BFR400, [id number], EDW 85.5kg,2K/2Ca Access:LUA BCF Heparin2600 unit bolus Mircera49mcg q4wks - last9/30 Hectorol77mcg IV qHD Assessment/ Plan:    1. Polymicrobial Bacteremia - BC +enterococcus, E coli and klebsiella pneumonia.Afebrilex24hrs, tmax 103.1. ID following. On Unasyn, waiting for sensitives. 2. Abscess at gallbladder fossa - identified on MRCP. Per surgery no indication of need for surgical intervention (lap chole 10/04/18),IR placed JP drain initially 5cc purulent fluid aspirated but minimal output 10/24. 3. 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 Per primary. 4. ESRD - On HD MWF. Last HD  Fri (net UF 1.5; post wt 86.8kg); next Mon on MWF regimen. 5. Hypertension/volume - BPmostly well controlled, not on antihypertensives. Does not appear volume overloaded.+JVD 6. Anemia of CKD - Hgb 9.2.Aranesp 38mcg 10/23.No iron due to possible hemosiderosis. 7. Secondary Hyperparathyroidism - Ca at goal. Phos in goal as OP. Continue  VDRA and sensipar. Will check phos Mon AM; cont binders for now.  8. Nutrition - Renal diet w/fluid restrictions once advanced.Vit, Nepro 9. DM - per primary  Subjective:   Patient seen and examined at bedside. Reports some pain in abd. Denies SOB, CP, n/v/d, orthopnea and edema.    Objective:   BP (!) 149/71 (BP Location: Right Arm)   Pulse 85   Temp 98.7 F (37.1 C) (Oral)   Resp 20   Wt 86.6 kg   SpO2 94%   BMI 29.03 kg/m   Intake/Output Summary (Last 24 hours) at 11/18/2018 9323 Last data filed at 11/18/2018 0600 Gross per 24 hour  Intake 590 ml  Output 125 ml  Net 465 ml   Weight change: -1.6 kg  Physical Exam: General:NAD, +jaundice, chronically ill appearing male, laying in bed Heart:RRR, +JVD Lungs:CTAB A/P, nml WOB Abdomen:soft, JP drain in RUQ w/serosanguinous drainage Extremities:no LE  edema Dialysis Access:LUA BCF great bruit  Imaging: Ct Image Guided Drainage By Percutaneous Catheter  Result Date: 11/16/2018 INDICATION: Post cholecystectomy, now with indeterminate fluid collection with the gallbladder fossa. Please perform image guided aspiration and/or drainage catheter placement. EXAM: CT IMAGE GUIDED DRAINAGE BY PERCUTANEOUS CATHETER COMPARISON:  CT abdomen and pelvis-11/15/2018; MRCP-11/15/2018 MEDICATIONS: The patient is currently admitted to the hospital and receiving intravenous antibiotics. The antibiotics were administered within an appropriate time frame prior to the initiation of the procedure. ANESTHESIA/SEDATION: Moderate (conscious) sedation was employed during this procedure. A total of Versed 1 mg and Fentanyl 25 mcg was administered intravenously. Moderate Sedation Time: 14 minutes. The patient's level of consciousness and vital signs were monitored continuously by radiology nursing throughout the procedure under my direct supervision. CONTRAST:  None COMPLICATIONS: None immediate. PROCEDURE: Informed written consent was obtained from the patient after a discussion of the risks, benefits and alternatives to treatment. The patient was placed supine on the CT gantry and a pre procedural CT was performed re-demonstrating the known abscess/fluid collection within the gallbladder fossa with dominant ill-defined component measuring approximately 4.0 x 2.1 cm (image 44, series 2). The procedure was planned. A timeout was performed prior to the initiation of the procedure. The skin overlying the right upper abdominal quadrant was prepped and draped in the usual sterile fashion. The overlying soft tissues were anesthetized with 1% lidocaine with epinephrine. Under direct ultrasound guidance, gallbladder fossa fluid collection was accessed with a 18 gauge trocar needle. A short Amplatz wire was coiled within the collection and appropriate positioning was confirmed with CT  imaging. Next, appropriate the tract was serially dilated allowing  placement of a 10 [place] all-purpose drainage catheter. Appropriate positioning was confirmed with a limited postprocedural CT scan. Approximately 5 cc of purulent fluid was aspirated. The tube was connected to a JP bulb and sutured in place. A dressing was placed. The patient tolerated the procedure well without immediate post procedural complication. IMPRESSION: Successful CT guided placement of a 10 [place] all purpose drain catheter into the gallbladder fossa fluid collection with aspiration of 5 mL of purulent fluid. Samples were sent to the laboratory as requested by the ordering clinical team. Electronically Signed   By: Sandi Mariscal M.D.   On: 11/16/2018 09:32    Labs: BMET Recent Labs  Lab 11/14/18 1702 11/16/18 0245 11/17/18 0247 11/18/18 0241  NA 133* 135 136 135  K 3.9 3.9 3.7 3.3*  CL 95* 97* 96* 95*  CO2 26 25 25 24   GLUCOSE 323* 109* 168* 166*  BUN 32* 30* 21* 41*  CREATININE 4.43* 4.58* 3.76* 4.96*  CALCIUM 8.7* 8.5* 8.4* 8.5*   CBC Recent Labs  Lab 11/14/18 1702 11/15/18 1210 11/16/18 0245 11/17/18 0247 11/18/18 0241  WBC 7.7 12.1* 8.1 7.2 7.3  NEUTROABS 7.4  --   --   --   --   HGB 10.6* 9.2* 8.9* 9.6* 10.4*  HCT 34.2* 29.3* 28.1* 29.8* 31.7*  MCV 92.9 92.7 92.4 90.0 88.3  PLT 190 104* 107* 128* 175    Medications:    . Chlorhexidine Gluconate Cloth  6 each Topical Q0600  . cinacalcet  60 mg Oral Q supper  . darbepoetin (ARANESP) injection - DIALYSIS  60 mcg Intravenous Q Fri-HD  . doxercalciferol  9 mcg Intravenous Q M,W,F-HD  . feeding supplement (PRO-STAT SUGAR FREE 64)  30 mL Oral BID  . insulin aspart  0-9 Units Subcutaneous TID WC  . mouth rinse  15 mL Mouth Rinse BID  . multivitamin  1 tablet Oral QHS  . sodium chloride flush  5 mL Intracatheter Q8H      Otelia Santee, MD 11/18/2018, 7:22 AM

## 2018-11-18 NOTE — Progress Notes (Signed)
Pharmacy Antibiotic Note  Edgar Brewer is a 58 y.o. male who presents on 11/14/2018 with vomiting, generalized weakness, and fever of 101. Lap chole on 9/10, perc drain 10/23. Pharmacy has been consulted for Unasyn dosing for sepsis, bacteremia, gallbladder abscess. Patient has ESRD and is on HD MWF. Today is day 4 Unasyn.  BCID shows E coli, K pneumo, Enterococcus. Plan for TEE on 10/26 to r/o IE.  Plan: Continue Unasyn IV 3g q24h (dosed after HD on HD days) Watch HD schedule Monitor WBC, Temp, Lactate, clinical status F/U length of therapy - at least 2 weeks pending TEE results  Weight: 190 lb 14.7 oz (86.6 kg)  Temp (24hrs), Avg:98.3 F (36.8 C), Min:97.7 F (36.5 C), Max:98.7 F (37.1 C)  Recent Labs  Lab 11/14/18 1702 11/14/18 1703 11/14/18 1845 11/15/18 1210 11/16/18 0245 11/17/18 0247 11/18/18 0241  WBC 7.7  --   --  12.1* 8.1 7.2 7.3  CREATININE 4.43*  --   --   --  4.58* 3.76* 4.96*  LATICACIDVEN  --  2.1* 1.5  --   --   --   --     Estimated Creatinine Clearance: 17.2 mL/min (A) (by C-G formula based on SCr of 4.96 mg/dL (H)).    Allergies  Allergen Reactions  . Codeine Anaphylaxis, Hives, Swelling and Other (See Comments)    Swelling all over body and the throat    Antimicrobials this admission: metronidazole 10/21 >>10/22 Cefepime 10/21 >>10/22 CTX 10/22>>10/23 Unasyn 10/22 >>  Microbiology results: 10/21 [id number], E coli, Klebsiella detected on BCID  10/21 COVID: negative 10/22 MRSA PCR: negative 10/22 blood x 2: ngtd 10/23 gall bladder abscess: rare GPC in prs, rare GVR on gram stain, pending  Thank you for involving pharmacy in this patient's care.  Renold Genta, PharmD, BCPS Clinical Pharmacist Clinical phone for 11/18/2018 until 3p is [phone] 11/18/2018 9:14 AM  **Pharmacist phone directory can be found on [email or web address] listed under [place]**

## 2018-11-18 NOTE — Progress Notes (Signed)
    [facility] has been requested to perform a transesophageal echocardiogram for bacteremia - timing TBD.  After careful review of history and examination, the risks and benefits of transesophageal echocardiogram have been explained including risks of esophageal damage, perforation (1:10,000 risk), bleeding, pharyngeal hematoma as well as other potential complications associated with conscious sedation including aspiration, arrhythmia, respiratory failure and death. Alternatives to treatment were discussed, questions were answered. Patient is willing to proceed.   TEE tentatively planned for 11/19/2018 with Dr. Acie Fredrickson. Timing TBD  Roby Lofts, PA-C 11/18/2018 8:34 AM

## 2018-11-19 ENCOUNTER — Inpatient Hospital Stay (HOSPITAL_COMMUNITY): Payer: Medicare Other

## 2018-11-19 ENCOUNTER — Encounter (HOSPITAL_COMMUNITY)
Admission: EM | Disposition: A | Discharge status: Home / Self Care | Payer: Self-pay | Source: Home / Self Care | Attending: Family Medicine

## 2018-11-19 DIAGNOSIS — I340 Nonrheumatic mitral (valve) insufficiency: Secondary | ICD-10-CM

## 2018-11-19 DIAGNOSIS — R7881 Bacteremia: Secondary | ICD-10-CM

## 2018-11-19 HISTORY — PX: TEE WITHOUT CARDIOVERSION: SHX5443

## 2018-11-19 LAB — GLUCOSE, CAPILLARY
Glucose-Capillary: 123 mg/dL — ABNORMAL HIGH (ref 70–99)
Glucose-Capillary: 125 mg/dL — ABNORMAL HIGH (ref 70–99)
Glucose-Capillary: 153 mg/dL — ABNORMAL HIGH (ref 70–99)
Glucose-Capillary: 252 mg/dL — ABNORMAL HIGH (ref 70–99)
Glucose-Capillary: 94 mg/dL (ref 70–99)

## 2018-11-19 LAB — CULTURE, BLOOD (ROUTINE X 2)

## 2018-11-19 LAB — COMPREHENSIVE METABOLIC PANEL
ALT: 67 U/L — ABNORMAL HIGH (ref 0–44)
AST: 42 U/L — ABNORMAL HIGH (ref 15–41)
Albumin: 2.1 g/dL — ABNORMAL LOW (ref 3.5–5.0)
Alkaline Phosphatase: 798 U/L — ABNORMAL HIGH (ref 38–126)
Anion gap: 15 (ref 5–15)
BUN: 51 mg/dL — ABNORMAL HIGH (ref 6–20)
CO2: 24 mmol/L (ref 22–32)
Calcium: 8.3 mg/dL — ABNORMAL LOW (ref 8.9–10.3)
Chloride: 98 mmol/L (ref 98–111)
Creatinine, Ser: 5.56 mg/dL — ABNORMAL HIGH (ref 0.61–1.24)
GFR calc Af Amer: 12 mL/min — ABNORMAL LOW (ref >60)
GFR calc non Af Amer: 10 mL/min — ABNORMAL LOW (ref >60)
Glucose, Bld: 134 mg/dL — ABNORMAL HIGH (ref 70–99)
Potassium: 3 mmol/L — ABNORMAL LOW (ref 3.5–5.1)
Sodium: 137 mmol/L (ref 135–145)
Total Bilirubin: 4.8 mg/dL — ABNORMAL HIGH (ref 0.3–1.2)
Total Protein: 6 g/dL — ABNORMAL LOW (ref 6.5–8.1)

## 2018-11-19 LAB — CBC
HCT: 29.5 % — ABNORMAL LOW (ref 39.0–52.0)
Hemoglobin: 9.7 g/dL — ABNORMAL LOW (ref 13.0–17.0)
MCH: 28.8 pg (ref 26.0–34.0)
MCHC: 32.9 g/dL (ref 30.0–36.0)
MCV: 87.5 fL (ref 80.0–100.0)
Platelets: 201 10*3/uL (ref 150–400)
RBC: 3.37 MIL/uL — ABNORMAL LOW (ref 4.22–5.81)
RDW: 15 % (ref 11.5–15.5)
WBC: 5.7 10*3/uL (ref 4.0–10.5)
nRBC: 0 % (ref 0.0–0.2)

## 2018-11-19 LAB — PHOSPHORUS: Phosphorus: 3.8 mg/dL (ref 2.5–4.6)

## 2018-11-19 LAB — URINE CULTURE: Culture: NO GROWTH

## 2018-11-19 SURGERY — ECHOCARDIOGRAM, TRANSESOPHAGEAL
Anesthesia: Moderate Sedation

## 2018-11-19 MED ORDER — DIPHENHYDRAMINE HCL 50 MG/ML IJ SOLN
INTRAMUSCULAR | Status: AC
  Filled 2018-11-19: qty 1

## 2018-11-19 MED ORDER — DIPHENHYDRAMINE HCL 25 MG PO CAPS: 25.0000 mg | ORAL_CAPSULE | ORAL | Status: DC | PRN

## 2018-11-19 MED ORDER — MIDAZOLAM HCL (PF) 5 MG/ML IJ SOLN
INTRAMUSCULAR | Status: AC
  Filled 2018-11-19: qty 2

## 2018-11-19 MED ORDER — HYDROXYZINE HCL 10 MG PO TABS
10.0000 mg | ORAL_TABLET | Freq: Three times a day (TID) | ORAL | Status: DC | PRN
  Filled 2018-11-19: qty 1

## 2018-11-19 MED ORDER — FENTANYL CITRATE (PF) 100 MCG/2ML IJ SOLN
INTRAMUSCULAR | Status: AC
  Filled 2018-11-19: qty 4

## 2018-11-19 MED ORDER — BUTAMBEN-TETRACAINE-BENZOCAINE 2-2-14 % EX AERO
INHALATION_SPRAY | CUTANEOUS | Status: DC | PRN
  Administered 2018-11-19: 15:00:00 2 via TOPICAL

## 2018-11-19 MED ORDER — CAMPHOR-MENTHOL 0.5-0.5 % EX LOTN
TOPICAL_LOTION | CUTANEOUS | Status: DC | PRN
  Filled 2018-11-19: qty 222

## 2018-11-19 MED ORDER — MIDAZOLAM HCL (PF) 10 MG/2ML IJ SOLN
INTRAMUSCULAR | Status: DC | PRN
  Administered 2018-11-19: 1 mg via INTRAVENOUS
  Administered 2018-11-19: 2 mg via INTRAVENOUS

## 2018-11-19 MED ORDER — FENTANYL CITRATE (PF) 100 MCG/2ML IJ SOLN
INTRAMUSCULAR | Status: DC | PRN
  Administered 2018-11-19: 25 ug via INTRAVENOUS

## 2018-11-19 MED ORDER — DOXERCALCIFEROL 4 MCG/2ML IV SOLN
9.0000 ug | INTRAVENOUS | Status: DC
  Filled 2018-11-19: qty 6

## 2018-11-19 NOTE — CV Procedure (Signed)
    Transesophageal Echocardiogram Note  Edgar Brewer [id number] [birth year]  Procedure: Transesophageal Echocardiogram Indications: bacteremia   Procedure Details Consent: Obtained Time Out: Verified patient identification, verified procedure, site/side was marked, verified correct patient position, special equipment/implants available, Radiology Safety Procedures followed,  medications/allergies/relevent history reviewed, required imaging and test results available.  Performed  Medications:  During this procedure the patient is administered a total of Versed 3  mg and Fentanyl  25  mcg  to achieve and maintain moderate conscious sedation.  The patient's heart rate, blood pressure, and oxygen saturation are monitored continuously during the procedure. The period of conscious sedation is  30  minutes, of which I was present face-to-face 100% of this time.  Left Ventrical:  Mildly depressed LV function.  EF 40-45%   Mitral Valve: no vegetation   Aortic Valve:  No vegetation.  The [place] is mildly calcified and has limited mobility   Tricuspid Valve: no vegetation   Pulmonic Valve: no vegetation   Left Atrium/ Left atrial appendage: no thrombi   Atrial septum: no ASD or PFO by color doppler   Aorta: mild calcification    Complications: No apparent complications Patient did tolerate procedure well.   Thayer Headings, Brooke Bonito., MD, [facility] 11/19/2018, 3:10 PM

## 2018-11-19 NOTE — H&P (View-Only) (Signed)
PROGRESS NOTE    Edgar Brewer  UYQ:[id number] DOB: 04/08/1960 DOA: 11/14/2018 PCP: Vassie Moment, MD      Brief Narrative:  Mr. Chacko is a 58 y.o. M with ESRD on HD, DM, HTN, dCHF, pHTN, and recent choledocholithiasis with lap chole on 10/04/18 who presented with 1 day new confusion and fever and RUQ pain.  Patient had presented 3 weeks prior to this admission, 1 week post-op with N/V and confusion, found to have a 3cm fluid collection adjacent to liver.  HIDA negative for leak, Gen Surg consulted, treated conservatively with empiric antibiotics and this was thought to be an uninfected post-op seroma.  Since discharge, has had persistent nausea, vomiting, malaise, no real change.  Now fatigue worse, confused, feverish.  In the ER, temp 103F, tachycardic, encphealopathic, SpO2 88%.  AST 187, Tbili 7.0.  CT shows 4x3x3 cm abscess.  BCID with Klebsiella, enterococci and E coli.        Assessment & Plan:  Sepsis from cholangitis Klebsiella, E-Coli and enterococcus bacteremia from cholangitis   S/p gallbladder fossa percutaneous aspiration and drainage Presented with Charcot's triad as well as AMS and acute hypoxic respiratory failure from sepsis with tachycardia and fever.  CT showed apparent infection of post-op GB seroma.    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  CT chest shows patchy opacity, favor atelectasis.   Sepsis physiology resolved.  Blood cultures growing E-Coli, klebsiella (both Unasyn susceptible) and Enterococcus.   WBC now normalized, drain output minimal, surgery have recommended to leave drain until IR clinic follow up.  -TEE planned this aftenroon -Continue Unasyn -Appreciate ID guidance -Consult IR appreciate assistance with GB drain.       Hemosiderosis Elevated ferritin Cholestatic LFTs pattern Ferritin elevation had been noted previously.  MRCP shows hemosiderosis.  Synthetic  funtion near normal: albumin, INR near normal.  Ammonia only minimally elevated, doubt hepatic encephalopathy I assume this is cholangitis or cholestasis from an infected seroma. 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  Actually had neuropsych testing for dementia as part of renal transplant work up, that mother says was normal.    This is now resolved to normal, patient has some slowed affect at baseline   ESRD -Maintenance HD per Nehprology, appreciate assistance -Continue Renvela, Cinacalcet, Hectorol   Hypertension Chronic diastolic CHF Pulmonary hypertension EF normal in October 2020.   BP elevated  Diabetes Glucoses excellent -Continue home tradjenta, gabapentin  -Continue SS insulin corrections  Anemia of CKD Baseline Hgb 9-11, stable relative to baseline        DVT prophylaxis: SCDs Code Status: FULL Family Communication: Mother at bedside    Consultants:   IR  Gen Surg  ID  Procedures:   10/21 CT abdomen >> mild biliary ductal dilatation, new CBG dilatation measuring up to 0.2 cm with mild surrounding fat stranding, could be due to choledocholithiasis    10/24 ECHO >> LVEF 55-60%, normal LV function, elevated LA / LVEDP, impaired diastolic filling, global RV normal systolic function, moderate thickening of the mitral valve leaflets, mild MR, tricuspid valve trivial regurgitation, mild AV sclerosis without stenosis without stenosis, moderately elevated PASP, no evidence of gross valvular vegetations  10/22 CT Chest >> mild patchy lower airspace disease,  L>R, suspect atelectasis    Antimicrobials:   Ampicillin 10/22 >> 10/23  Ceftriaxone 10/22 >>  Flagyl 10/22 >> 10/23  Unasyn 10/22 >>   Culture data:   10/21 blood culture x2  >> E-Coli, klebsiella pneumoniae, e faecalis    10/22 blood culture x2 >> ng  10/23 GB foss aspirate culture >> similar polymicrobial to 10/21 bcx, but reincubated  Subjective: Threw up once this morning but no fever, chills, shortness of breath, swelling, confusion.        Objective: Vitals:   11/19/18 0545 11/19/18 0546 11/19/18 0700 11/19/18 0817  BP: (!) 159/75   (!) 155/71  Pulse: 76 77 74 79  Resp:  '10 16 19  '$ Temp: 97.6 F (36.4 C)   98.1 F (36.7 C)  TempSrc: Oral   Oral  SpO2: 98% 99% 100% 99%  Weight:        Intake/Output Summary (Last 24 hours) at 11/19/2018 1014 Last data filed at 11/19/2018 0547 Gross per 24 hour  Intake 805 ml  Output 15 ml  Net 790 ml   Filed Weights   11/16/18 1215 11/16/18 1624 11/18/18 0411  Weight: 88.2 kg 86.8 kg 86.6 kg    Examination: General appearance: Adult male, lying in bed, no acute distress, interactive HEENT: Jaundice resolved, lids and lashes normal.  Oropharynx moist, no oral lesions, hearing normal, lips and teeth normal. SKIN: Warm and dry, no suspicious rashes or lesions. Cardiac: RRR, no murmurs, no lower extremity edema. Respiratory: Normal respiratory rate, lungs clear without rales or wheezes. Abdomen: Abdomen tender in the right upper quadrant, no guarding or rebound.  No rigidity. MSK: Normal muscle bulk and tone. Neuro: Awake and alert, extraocular movements intact, moves all extremities with normal strength and coordination, speech fluent. Psych: Attention normal, psychomotor slowing noted, affect flat, judgment insight appeared normal.     Data Reviewed: I have personally reviewed following labs and imaging studies:  CBC: Recent Labs  Lab 11/14/18 1702 11/15/18 1210 11/16/18 0245 11/17/18 0247 11/18/18 0241 11/19/18 0311  WBC 7.7 12.1* 8.1 7.2 7.3 5.7  NEUTROABS 7.4  --   --   --   --   --   HGB 10.6* 9.2* 8.9* 9.6* 10.4* 9.7*  HCT 34.2* 29.3* 28.1* 29.8* 31.7* 29.5*  MCV 92.9 92.7 92.4  90.0 88.3 87.5  PLT 190 104* 107* 128* 175 678   Basic Metabolic Panel: Recent Labs  Lab 11/14/18 1702 11/16/18 0245 11/17/18 0247 11/18/18 0241 11/19/18 0311  NA 133* 135 136 135 137  K 3.9 3.9 3.7 3.3* 3.0*  CL 95* 97* 96* 95* 98  CO2 '26 25 25 24 24  '$ GLUCOSE 323* 109* 168* 166* 134*  BUN 32* 30* 21* 41* 51*  CREATININE 4.43* 4.58* 3.76* 4.96* 5.56*  CALCIUM 8.7* 8.5* 8.4* 8.5* 8.3*  PHOS  --   --   --   --  3.8   GFR: Estimated Creatinine Clearance: 15.3 mL/min (A) (by C-G formula based on SCr of 5.56 mg/dL (H)). Liver Function Tests: Recent Labs  Lab 11/14/18 1702 11/16/18 0245 11/17/18 0247 11/18/18 0241 11/19/18 0311  AST 169* 81* 45* 28 42*  ALT 187* 131* 99* 73* 67*  ALKPHOS 1,114* 746* 815* 863* 798*  BILITOT 7.0* 6.5* 6.6* 6.3* 4.8*  PROT 6.6 5.9* 6.5 6.4* 6.0*  ALBUMIN 3.1* 2.3* 2.3* 2.3* 2.1*   No results for input(s): LIPASE, AMYLASE in the last 168 hours. Recent Labs  Lab 11/15/18 0242  AMMONIA 38*   Coagulation  Profile: Recent Labs  Lab 11/14/18 1702  INR 1.0   Cardiac Enzymes: No results for input(s): CKTOTAL, CKMB, CKMBINDEX, TROPONINI in the last 168 hours. BNP (last 3 results) No results for input(s): PROBNP in the last 8760 hours. HbA1C: No results for input(s): HGBA1C in the last 72 hours. CBG: Recent Labs  Lab 11/18/18 0805 11/18/18 1213 11/18/18 1626 11/19/18 0044 11/19/18 0822  GLUCAP 181* 215* 172* 153* 123*   Lipid Profile: No results for input(s): CHOL, HDL, LDLCALC, TRIG, CHOLHDL, LDLDIRECT in the last 72 hours. Thyroid Function Tests: No results for input(s): TSH, T4TOTAL, FREET4, T3FREE, THYROIDAB in the last 72 hours. Anemia Panel: No results for input(s): VITAMINB12, FOLATE, FERRITIN, TIBC, IRON, RETICCTPCT in the last 72 hours. Urine analysis:    Component Value Date/Time   COLORURINE AMBER (A) 11/18/2018 0807   APPEARANCEUR CLEAR 11/18/2018 0807   LABSPEC 1.015 11/18/2018 0807   PHURINE 6.0 11/18/2018 0807    GLUCOSEU >=500 (A) 11/18/2018 0807   HGBUR NEGATIVE 11/18/2018 0807   BILIRUBINUR SMALL (A) 11/18/2018 0807   KETONESUR 5 (A) 11/18/2018 0807   PROTEINUR >=300 (A) 11/18/2018 0807   NITRITE NEGATIVE 11/18/2018 0807   LEUKOCYTESUR NEGATIVE 11/18/2018 0807   Sepsis Labs: _0 (procalcitonin:4,lacticacidven:4)  ) Recent Results (from the past 240 hour(s))  Culture, blood (Routine x 2)     Status: Abnormal   Collection Time: 11/14/18  5:03 PM   Specimen: BLOOD  Result Value Ref Range Status   Specimen Description BLOOD RIGHT ANTECUBITAL  Final   Special Requests   Final    BOTTLES DRAWN AEROBIC AND ANAEROBIC Blood Culture results may not be optimal due to an inadequate volume of blood received in culture bottles   Culture  Setup Time   Final    ANAEROBIC BOTTLE ONLY GRAM NEGATIVE RODS GRAM POSITIVE COCCI AEROBIC BOTTLE ONLY GRAM NEGATIVE RODS GRAM POSITIVE COCCI CRITICAL RESULT CALLED TO, READ BACK BY AND VERIFIED WITH: G ABBOTT PHARMD 11/15/18 0601 JDW    Culture (A)  Final    ESCHERICHIA COLI KLEBSIELLA PNEUMONIAE ENTEROCOCCUS FAECALIS SUSCEPTIBILITIES PERFORMED ON PREVIOUS CULTURE WITHIN THE LAST 5 DAYS. Performed at [facility], [place] [address] [place], [place] [address]    Report Status 11/19/2018 FINAL  Final  Blood Culture ID Panel (Reflexed)     Status: Abnormal   Collection Time: 11/14/18  5:03 PM  Result Value Ref Range Status   Enterococcus species DETECTED (A) NOT DETECTED Final    Comment: CRITICAL RESULT CALLED TO, READ BACK BY AND VERIFIED WITH: G ABBOTT PHARMD 11/15/18 0601 JDW    Vancomycin resistance NOT DETECTED NOT DETECTED Final   Listeria monocytogenes NOT DETECTED NOT DETECTED Final   Staphylococcus species NOT DETECTED NOT DETECTED Final   Staphylococcus aureus (BCID) NOT DETECTED NOT DETECTED Final   Streptococcus species NOT DETECTED NOT DETECTED Final   Streptococcus agalactiae NOT DETECTED NOT DETECTED Final   Streptococcus  pneumoniae NOT DETECTED NOT DETECTED Final   Streptococcus pyogenes NOT DETECTED NOT DETECTED Final   Acinetobacter baumannii NOT DETECTED NOT DETECTED Final   Enterobacteriaceae species DETECTED (A) NOT DETECTED Final    Comment: CRITICAL RESULT CALLED TO, READ BACK BY AND VERIFIED WITH: G ABBOTT PHARMD 11/15/18 0601 JDW    Enterobacter cloacae complex NOT DETECTED NOT DETECTED Final   Escherichia coli DETECTED (A) NOT DETECTED Final    Comment: CRITICAL RESULT CALLED TO, READ BACK BY AND VERIFIED WITH: G ABBOTT PHARMD 11/15/18 0601 JDW    Klebsiella oxytoca  NOT DETECTED NOT DETECTED Final   Klebsiella pneumoniae DETECTED (A) NOT DETECTED Final    Comment: CRITICAL RESULT CALLED TO, READ BACK BY AND VERIFIED WITH: G ABBOTT PHARMD 11/15/18 0601 JDW    Proteus species NOT DETECTED NOT DETECTED Final   Serratia marcescens NOT DETECTED NOT DETECTED Final   Carbapenem resistance NOT DETECTED NOT DETECTED Final   Haemophilus influenzae NOT DETECTED NOT DETECTED Final   Neisseria meningitidis NOT DETECTED NOT DETECTED Final   Pseudomonas aeruginosa NOT DETECTED NOT DETECTED Final   Candida albicans NOT DETECTED NOT DETECTED Final   Candida glabrata NOT DETECTED NOT DETECTED Final   Candida krusei NOT DETECTED NOT DETECTED Final   Candida parapsilosis NOT DETECTED NOT DETECTED Final   Candida tropicalis NOT DETECTED NOT DETECTED Final    Comment: Performed at [facility], [address] [place], [place] [address]  Culture, blood (Routine x 2)     Status: Abnormal   Collection Time: 11/14/18  6:40 PM   Specimen: BLOOD RIGHT FOREARM  Result Value Ref Range Status   Specimen Description BLOOD RIGHT FOREARM  Final   Special Requests   Final    BOTTLES DRAWN AEROBIC AND ANAEROBIC Blood Culture results may not be optimal due to an inadequate volume of blood received in culture bottles   Culture  Setup Time   Final    IN BOTH AEROBIC AND ANAEROBIC BOTTLES GRAM POSITIVE COCCI GRAM  NEGATIVE RODS CRITICAL VALUE NOTED.  VALUE IS CONSISTENT WITH PREVIOUSLY REPORTED AND CALLED VALUE. Performed at [facility], [place] [address], [place], [place] [address]    Culture (A)  Final    ESCHERICHIA COLI KLEBSIELLA PNEUMONIAE ENTEROCOCCUS FAECALIS    Report Status 11/19/2018 FINAL  Final   Organism ID, Bacteria ESCHERICHIA COLI  Final   Organism ID, Bacteria KLEBSIELLA PNEUMONIAE  Final   Organism ID, Bacteria ENTEROCOCCUS FAECALIS  Final      Susceptibility   Escherichia coli - MIC*    AMPICILLIN <=2 SENSITIVE Sensitive     CEFAZOLIN <=4 SENSITIVE Sensitive     CEFEPIME <=1 SENSITIVE Sensitive     CEFTAZIDIME <=1 SENSITIVE Sensitive     CEFTRIAXONE <=1 SENSITIVE Sensitive     CIPROFLOXACIN <=0.25 SENSITIVE Sensitive     GENTAMICIN <=1 SENSITIVE Sensitive     IMIPENEM <=0.25 SENSITIVE Sensitive     TRIMETH/SULFA <=20 SENSITIVE Sensitive     AMPICILLIN/SULBACTAM <=2 SENSITIVE Sensitive     PIP/TAZO <=4 SENSITIVE Sensitive     Extended ESBL NEGATIVE Sensitive     * ESCHERICHIA COLI   Enterococcus faecalis - MIC*    AMPICILLIN <=2 SENSITIVE Sensitive     VANCOMYCIN 2 SENSITIVE Sensitive     GENTAMICIN SYNERGY SENSITIVE Sensitive     * ENTEROCOCCUS FAECALIS   Klebsiella pneumoniae - MIC*    AMPICILLIN >=32 RESISTANT Resistant     CEFAZOLIN <=4 SENSITIVE Sensitive     CEFEPIME <=1 SENSITIVE Sensitive     CEFTAZIDIME <=1 SENSITIVE Sensitive     CEFTRIAXONE <=1 SENSITIVE Sensitive     CIPROFLOXACIN <=0.25 SENSITIVE Sensitive     GENTAMICIN <=1 SENSITIVE Sensitive     IMIPENEM <=0.25 SENSITIVE Sensitive     TRIMETH/SULFA <=20 SENSITIVE Sensitive     AMPICILLIN/SULBACTAM 8 SENSITIVE Sensitive     PIP/TAZO <=4 SENSITIVE Sensitive     Extended ESBL NEGATIVE Sensitive     * KLEBSIELLA PNEUMONIAE  SARS CORONAVIRUS 2 (TAT 6-24 HRS) Nasopharyngeal Nasopharyngeal Swab  Status: None   Collection Time: 11/14/18 10:33 PM   Specimen: Nasopharyngeal Swab  Result Value  Ref Range Status   SARS Coronavirus 2 NEGATIVE NEGATIVE Final    Comment: (NOTE) SARS-CoV-2 target nucleic acids are NOT DETECTED. The SARS-CoV-2 RNA is generally detectable in upper and lower respiratory specimens during the acute phase of infection. Negative results do not preclude SARS-CoV-2 infection, do not rule out co-infections with other pathogens, and should not be used as the sole basis for treatment or other patient management decisions. Negative results must be combined with clinical observations, patient history, and epidemiological information. The expected result is Negative. Fact Sheet for Patients: SugarRoll.be Fact Sheet for Healthcare Providers: https://www.woods-mathews.com/ This test is not yet approved or cleared by the [place] FDA and  has been authorized for detection and/or diagnosis of SARS-CoV-2 by FDA under an Emergency Use Authorization (EUA). This EUA will remain  in effect (meaning this test can be used) for the duration of the COVID-19 declaration under Section 56 4(b)(1) of the Act, 21 U.S.C. section 360bbb-3(b)(1), unless the authorization is terminated or revoked sooner. Performed at [facility], [place] [address], [place], [place] [address]   MRSA PCR Screening     Status: None   Collection Time: 11/15/18  3:57 PM   Specimen: Nasopharyngeal  Result Value Ref Range Status   MRSA by PCR NEGATIVE NEGATIVE Final    Comment:        The GeneXpert MRSA Assay (FDA approved for NASAL specimens only), is one component of a comprehensive MRSA colonization surveillance program. It is not intended to diagnose MRSA infection nor to guide or monitor treatment for MRSA infections. Performed at [facility], [place] [address] [place], [place] [address]   Culture, blood (routine x 2)     Status: None (Preliminary result)   Collection Time: 11/15/18  5:30 PM   Specimen: BLOOD  Result Value Ref Range  Status   Specimen Description BLOOD RIGHT ANTECUBITAL  Final   Special Requests   Final    BOTTLES DRAWN AEROBIC ONLY Blood Culture adequate volume   Culture   Final    NO GROWTH 4 DAYS Performed at [facility], [place] [address], [place], [place] [address]    Report Status PENDING  Incomplete  Culture, blood (routine x 2)     Status: None (Preliminary result)   Collection Time: 11/15/18  5:47 PM   Specimen: BLOOD RIGHT HAND  Result Value Ref Range Status   Specimen Description BLOOD RIGHT HAND  Final   Special Requests   Final    BOTTLES DRAWN AEROBIC ONLY Blood Culture adequate volume   Culture   Final    NO GROWTH 4 DAYS Performed at [facility], [place] [address] [place],  [address]    Report Status PENDING  Incomplete  Aerobic/Anaerobic Culture (surgical/deep wound)     Status: Abnormal (Preliminary result)   Collection Time: 11/16/18  9:18 AM   Specimen: Abscess  Result Value Ref Range Status   Specimen Description ABSCESS GALL BLADDER  Final   Special Requests Normal  Final   Gram Stain   Final    FEW WBC PRESENT,BOTH PMN AND MONONUCLEAR RARE GRAM POSITIVE COCCI IN PAIRS RARE GRAM VARIABLE ROD Performed at [facility], [place] [address] [place],  [address]    Culture MULTIPLE ORGANISMS PRESENT, NONE PREDOMINANT (A)  Final   Report Status PENDING  Incomplete  Urine culture  Status: None   Collection Time: 11/18/18  5:44 AM   Specimen: In/Out Cath Urine  Result Value Ref Range Status   Specimen Description IN/OUT CATH URINE  Final   Special Requests NONE  Final   Culture   Final    NO GROWTH Performed at [facility], [place] [address], [place], [place] [address]    Report Status 11/19/2018 FINAL  Final         Radiology Studies: No results found.      Scheduled Meds: . Chlorhexidine Gluconate Cloth  6 each Topical Q0600  . cinacalcet  60 mg Oral Q supper  . darbepoetin (ARANESP) injection - DIALYSIS  60 mcg  Intravenous Q Fri-HD  . doxercalciferol  9 mcg Intravenous Q M,W,F-HD  . feeding supplement (PRO-STAT SUGAR FREE 64)  30 mL Oral BID  . gabapentin  300 mg Oral BID  . insulin aspart  0-9 Units Subcutaneous TID WC  . linagliptin  5 mg Oral Q breakfast  . mouth rinse  15 mL Mouth Rinse BID  . multivitamin  1 tablet Oral QHS  . sodium chloride flush  5 mL Intracatheter Q8H   Continuous Infusions: . sodium chloride 10 mL/hr at 11/17/18 2220  . ampicillin-sulbactam (UNASYN) IV 3 g (11/18/18 2158)     LOS: 5 days    Time spent: 25 minutes   Shana Chute, MD  [facility] 11/19/2018, 10:14 AM     Please page through [place]:  [email or web address] Password [email or web address] If 7PM-7AM, please contact night-coverage       [employer]

## 2018-11-19 NOTE — Progress Notes (Signed)
PROGRESS NOTE    Edgar Brewer  OQH:[id number] DOB: October 05, 1960 DOA: 11/14/2018 PCP: Vassie Moment, MD      Brief Narrative:  [name] is a 58 y.o. M with ESRD on HD, DM, HTN, dCHF, pHTN, and recent choledocholithiasis with lap chole on 10/04/18 who presented with 1 day new confusion and fever and RUQ pain.  Patient had presented 3 weeks prior to this admission, 1 week post-op with N/V and confusion, found to have a 3cm fluid collection adjacent to liver.  HIDA negative for leak, Gen Surg consulted, treated conservatively with empiric antibiotics and this was thought to be an uninfected post-op seroma.  Since discharge, has had persistent nausea, vomiting, malaise, no real change.  Now fatigue worse, confused, feverish.  In the ER, temp 103F, tachycardic, encphealopathic, SpO2 88%.  AST 187, Tbili 7.0.  CT shows 4x3x3 cm abscess.  BCID with Klebsiella, enterococci and E coli.        Assessment & Plan:  Sepsis from cholangitis Klebsiella, E-Coli and enterococcus bacteremia from cholangitis   S/p gallbladder fossa percutaneous aspiration and drainage Presented with Charcot's triad as well as AMS and acute hypoxic respiratory failure from sepsis with tachycardia and fever.  CT showed apparent infection of post-op GB seroma.    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  CT chest shows patchy opacity, favor atelectasis.   Sepsis physiology resolved.  Blood cultures growing E-Coli, klebsiella (both Unasyn susceptible) and Enterococcus.   WBC now normalized, drain output minimal, surgery have recommended to leave drain until IR clinic follow up.  -TEE planned this aftenroon -Continue Unasyn -Appreciate ID guidance -Consult IR appreciate assistance with GB drain.       Hemosiderosis Elevated ferritin Cholestatic LFTs pattern Ferritin elevation had been noted previously.  MRCP shows hemosiderosis.  Synthetic  funtion near normal: albumin, INR near normal.  Ammonia only minimally elevated, doubt hepatic encephalopathy I assume this is cholangitis or cholestasis from an infected seroma. 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  Actually had neuropsych testing for dementia as part of renal transplant work up, that mother says was normal.    This is now resolved to normal, patient has some slowed affect at baseline   ESRD -Maintenance HD per Nehprology, appreciate assistance -Continue Renvela, Cinacalcet, Hectorol   Hypertension Chronic diastolic CHF Pulmonary hypertension EF normal in October 2020.   BP elevated  Diabetes Glucoses excellent -Continue home tradjenta, gabapentin  -Continue SS insulin corrections  Anemia of CKD Baseline Hgb 9-11, stable relative to baseline        DVT prophylaxis: SCDs Code Status: FULL Family Communication: Mother at bedside    Consultants:   IR  Gen Surg  ID  Procedures:   10/21 CT abdomen >> mild biliary ductal dilatation, new CBG dilatation measuring up to 0.2 cm with mild surrounding fat stranding, could be due to choledocholithiasis    10/24 ECHO >> LVEF 55-60%, normal LV function, elevated LA / LVEDP, impaired diastolic filling, global RV normal systolic function, moderate thickening of the mitral valve leaflets, mild MR, tricuspid valve trivial regurgitation, mild AV sclerosis without stenosis without stenosis, moderately elevated PASP, no evidence of gross valvular vegetations  10/22 CT Chest >> mild patchy lower airspace disease,  L>R, suspect atelectasis    Antimicrobials:   Ampicillin 10/22 >> 10/23  Ceftriaxone 10/22 >>  Flagyl 10/22 >> 10/23  Unasyn 10/22 >>   Culture data:   10/21 blood culture x2  >> E-Coli, klebsiella pneumoniae, e faecalis    10/22 blood culture x2 >> ng  10/23 GB foss aspirate culture >> similar polymicrobial to 10/21 bcx, but reincubated  Subjective: Threw up once this morning but no fever, chills, shortness of breath, swelling, confusion.        Objective: Vitals:   11/19/18 0545 11/19/18 0546 11/19/18 0700 11/19/18 0817  BP: (!) 159/75   (!) 155/71  Pulse: 76 77 74 79  Resp:  '10 16 19  '$ Temp: 97.6 F (36.4 C)   98.1 F (36.7 C)  TempSrc: Oral   Oral  SpO2: 98% 99% 100% 99%  Weight:        Intake/Output Summary (Last 24 hours) at 11/19/2018 1014 Last data filed at 11/19/2018 0547 Gross per 24 hour  Intake 805 ml  Output 15 ml  Net 790 ml   Filed Weights   11/16/18 1215 11/16/18 1624 11/18/18 0411  Weight: 88.2 kg 86.8 kg 86.6 kg    Examination: General appearance: Adult male, lying in bed, no acute distress, interactive HEENT: Jaundice resolved, lids and lashes normal.  Oropharynx moist, no oral lesions, hearing normal, lips and teeth normal. SKIN: Warm and dry, no suspicious rashes or lesions. Cardiac: RRR, no murmurs, no lower extremity edema. Respiratory: Normal respiratory rate, lungs clear without rales or wheezes. Abdomen: Abdomen tender in the right upper quadrant, no guarding or rebound.  No rigidity. MSK: Normal muscle bulk and tone. Neuro: Awake and alert, extraocular movements intact, moves all extremities with normal strength and coordination, speech fluent. Psych: Attention normal, psychomotor slowing noted, affect flat, judgment insight appeared normal.     Data Reviewed: I have personally reviewed following labs and imaging studies:  CBC: Recent Labs  Lab 11/14/18 1702 11/15/18 1210 11/16/18 0245 11/17/18 0247 11/18/18 0241 11/19/18 0311  WBC 7.7 12.1* 8.1 7.2 7.3 5.7  NEUTROABS 7.4  --   --   --   --   --   HGB 10.6* 9.2* 8.9* 9.6* 10.4* 9.7*  HCT 34.2* 29.3* 28.1* 29.8* 31.7* 29.5*  MCV 92.9 92.7 92.4  90.0 88.3 87.5  PLT 190 104* 107* 128* 175 268   Basic Metabolic Panel: Recent Labs  Lab 11/14/18 1702 11/16/18 0245 11/17/18 0247 11/18/18 0241 11/19/18 0311  NA 133* 135 136 135 137  K 3.9 3.9 3.7 3.3* 3.0*  CL 95* 97* 96* 95* 98  CO2 '26 25 25 24 24  '$ GLUCOSE 323* 109* 168* 166* 134*  BUN 32* 30* 21* 41* 51*  CREATININE 4.43* 4.58* 3.76* 4.96* 5.56*  CALCIUM 8.7* 8.5* 8.4* 8.5* 8.3*  PHOS  --   --   --   --  3.8   GFR: Estimated Creatinine Clearance: 15.3 mL/min (A) (by C-G formula based on SCr of 5.56 mg/dL (H)). Liver Function Tests: Recent Labs  Lab 11/14/18 1702 11/16/18 0245 11/17/18 0247 11/18/18 0241 11/19/18 0311  AST 169* 81* 45* 28 42*  ALT 187* 131* 99* 73* 67*  ALKPHOS 1,114* 746* 815* 863* 798*  BILITOT 7.0* 6.5* 6.6* 6.3* 4.8*  PROT 6.6 5.9* 6.5 6.4* 6.0*  ALBUMIN 3.1* 2.3* 2.3* 2.3* 2.1*   No results for input(s): LIPASE, AMYLASE in the last 168 hours. Recent Labs  Lab 11/15/18 0242  AMMONIA 38*   Coagulation  Profile: Recent Labs  Lab 11/14/18 1702  INR 1.0   Cardiac Enzymes: No results for input(s): CKTOTAL, CKMB, CKMBINDEX, TROPONINI in the last 168 hours. BNP (last 3 results) No results for input(s): PROBNP in the last 8760 hours. HbA1C: No results for input(s): HGBA1C in the last 72 hours. CBG: Recent Labs  Lab 11/18/18 0805 11/18/18 1213 11/18/18 1626 11/19/18 0044 11/19/18 0822  GLUCAP 181* 215* 172* 153* 123*   Lipid Profile: No results for input(s): CHOL, HDL, LDLCALC, TRIG, CHOLHDL, LDLDIRECT in the last 72 hours. Thyroid Function Tests: No results for input(s): TSH, T4TOTAL, FREET4, T3FREE, THYROIDAB in the last 72 hours. Anemia Panel: No results for input(s): VITAMINB12, FOLATE, FERRITIN, TIBC, IRON, RETICCTPCT in the last 72 hours. Urine analysis:    Component Value Date/Time   COLORURINE AMBER (A) 11/18/2018 0807   APPEARANCEUR CLEAR 11/18/2018 0807   LABSPEC 1.015 11/18/2018 0807   PHURINE 6.0 11/18/2018 0807    GLUCOSEU >=500 (A) 11/18/2018 0807   HGBUR NEGATIVE 11/18/2018 0807   BILIRUBINUR SMALL (A) 11/18/2018 0807   KETONESUR 5 (A) 11/18/2018 0807   PROTEINUR >=300 (A) 11/18/2018 0807   NITRITE NEGATIVE 11/18/2018 0807   LEUKOCYTESUR NEGATIVE 11/18/2018 0807   Sepsis Labs: _0 (procalcitonin:4,lacticacidven:4)  ) Recent Results (from the past 240 hour(s))  Culture, blood (Routine x 2)     Status: Abnormal   Collection Time: 11/14/18  5:03 PM   Specimen: BLOOD  Result Value Ref Range Status   Specimen Description BLOOD RIGHT ANTECUBITAL  Final   Special Requests   Final    BOTTLES DRAWN AEROBIC AND ANAEROBIC Blood Culture results may not be optimal due to an inadequate volume of blood received in culture bottles   Culture  Setup Time   Final    ANAEROBIC BOTTLE ONLY GRAM NEGATIVE RODS GRAM POSITIVE COCCI AEROBIC BOTTLE ONLY GRAM NEGATIVE RODS GRAM POSITIVE COCCI CRITICAL RESULT CALLED TO, READ BACK BY AND VERIFIED WITH: G ABBOTT PHARMD 11/15/18 0601 JDW    Culture (A)  Final    ESCHERICHIA COLI KLEBSIELLA PNEUMONIAE ENTEROCOCCUS FAECALIS SUSCEPTIBILITIES PERFORMED ON PREVIOUS CULTURE WITHIN THE LAST 5 DAYS. Performed at [facility], [place] [address] [place], [place] [address]    Report Status 11/19/2018 FINAL  Final  Blood Culture ID Panel (Reflexed)     Status: Abnormal   Collection Time: 11/14/18  5:03 PM  Result Value Ref Range Status   Enterococcus species DETECTED (A) NOT DETECTED Final    Comment: CRITICAL RESULT CALLED TO, READ BACK BY AND VERIFIED WITH: G ABBOTT PHARMD 11/15/18 0601 JDW    Vancomycin resistance NOT DETECTED NOT DETECTED Final   Listeria monocytogenes NOT DETECTED NOT DETECTED Final   Staphylococcus species NOT DETECTED NOT DETECTED Final   Staphylococcus aureus (BCID) NOT DETECTED NOT DETECTED Final   Streptococcus species NOT DETECTED NOT DETECTED Final   Streptococcus agalactiae NOT DETECTED NOT DETECTED Final   Streptococcus  pneumoniae NOT DETECTED NOT DETECTED Final   Streptococcus pyogenes NOT DETECTED NOT DETECTED Final   Acinetobacter baumannii NOT DETECTED NOT DETECTED Final   Enterobacteriaceae species DETECTED (A) NOT DETECTED Final    Comment: CRITICAL RESULT CALLED TO, READ BACK BY AND VERIFIED WITH: G ABBOTT PHARMD 11/15/18 0601 JDW    Enterobacter cloacae complex NOT DETECTED NOT DETECTED Final   Escherichia coli DETECTED (A) NOT DETECTED Final    Comment: CRITICAL RESULT CALLED TO, READ BACK BY AND VERIFIED WITH: G ABBOTT PHARMD 11/15/18 0601 JDW    Klebsiella oxytoca  NOT DETECTED NOT DETECTED Final   Klebsiella pneumoniae DETECTED (A) NOT DETECTED Final    Comment: CRITICAL RESULT CALLED TO, READ BACK BY AND VERIFIED WITH: G ABBOTT PHARMD 11/15/18 0601 JDW    Proteus species NOT DETECTED NOT DETECTED Final   Serratia marcescens NOT DETECTED NOT DETECTED Final   Carbapenem resistance NOT DETECTED NOT DETECTED Final   Haemophilus influenzae NOT DETECTED NOT DETECTED Final   Neisseria meningitidis NOT DETECTED NOT DETECTED Final   Pseudomonas aeruginosa NOT DETECTED NOT DETECTED Final   Candida albicans NOT DETECTED NOT DETECTED Final   Candida glabrata NOT DETECTED NOT DETECTED Final   Candida krusei NOT DETECTED NOT DETECTED Final   Candida parapsilosis NOT DETECTED NOT DETECTED Final   Candida tropicalis NOT DETECTED NOT DETECTED Final    Comment: Performed at [facility], [address] [place], [place] [address]  Culture, blood (Routine x 2)     Status: Abnormal   Collection Time: 11/14/18  6:40 PM   Specimen: BLOOD RIGHT FOREARM  Result Value Ref Range Status   Specimen Description BLOOD RIGHT FOREARM  Final   Special Requests   Final    BOTTLES DRAWN AEROBIC AND ANAEROBIC Blood Culture results may not be optimal due to an inadequate volume of blood received in culture bottles   Culture  Setup Time   Final    IN BOTH AEROBIC AND ANAEROBIC BOTTLES GRAM POSITIVE COCCI GRAM  NEGATIVE RODS CRITICAL VALUE NOTED.  VALUE IS CONSISTENT WITH PREVIOUSLY REPORTED AND CALLED VALUE. Performed at [facility], [place] [address], [place], [place] [address]    Culture (A)  Final    ESCHERICHIA COLI KLEBSIELLA PNEUMONIAE ENTEROCOCCUS FAECALIS    Report Status 11/19/2018 FINAL  Final   Organism ID, Bacteria ESCHERICHIA COLI  Final   Organism ID, Bacteria KLEBSIELLA PNEUMONIAE  Final   Organism ID, Bacteria ENTEROCOCCUS FAECALIS  Final      Susceptibility   Escherichia coli - MIC*    AMPICILLIN <=2 SENSITIVE Sensitive     CEFAZOLIN <=4 SENSITIVE Sensitive     CEFEPIME <=1 SENSITIVE Sensitive     CEFTAZIDIME <=1 SENSITIVE Sensitive     CEFTRIAXONE <=1 SENSITIVE Sensitive     CIPROFLOXACIN <=0.25 SENSITIVE Sensitive     GENTAMICIN <=1 SENSITIVE Sensitive     IMIPENEM <=0.25 SENSITIVE Sensitive     TRIMETH/SULFA <=20 SENSITIVE Sensitive     AMPICILLIN/SULBACTAM <=2 SENSITIVE Sensitive     PIP/TAZO <=4 SENSITIVE Sensitive     Extended ESBL NEGATIVE Sensitive     * ESCHERICHIA COLI   Enterococcus faecalis - MIC*    AMPICILLIN <=2 SENSITIVE Sensitive     VANCOMYCIN 2 SENSITIVE Sensitive     GENTAMICIN SYNERGY SENSITIVE Sensitive     * ENTEROCOCCUS FAECALIS   Klebsiella pneumoniae - MIC*    AMPICILLIN >=32 RESISTANT Resistant     CEFAZOLIN <=4 SENSITIVE Sensitive     CEFEPIME <=1 SENSITIVE Sensitive     CEFTAZIDIME <=1 SENSITIVE Sensitive     CEFTRIAXONE <=1 SENSITIVE Sensitive     CIPROFLOXACIN <=0.25 SENSITIVE Sensitive     GENTAMICIN <=1 SENSITIVE Sensitive     IMIPENEM <=0.25 SENSITIVE Sensitive     TRIMETH/SULFA <=20 SENSITIVE Sensitive     AMPICILLIN/SULBACTAM 8 SENSITIVE Sensitive     PIP/TAZO <=4 SENSITIVE Sensitive     Extended ESBL NEGATIVE Sensitive     * KLEBSIELLA PNEUMONIAE  SARS CORONAVIRUS 2 (TAT 6-24 HRS) Nasopharyngeal Nasopharyngeal Swab  Status: None   Collection Time: 11/14/18 10:33 PM   Specimen: Nasopharyngeal Swab  Result Value  Ref Range Status   SARS Coronavirus 2 NEGATIVE NEGATIVE Final    Comment: (NOTE) SARS-CoV-2 target nucleic acids are NOT DETECTED. The SARS-CoV-2 RNA is generally detectable in upper and lower respiratory specimens during the acute phase of infection. Negative results do not preclude SARS-CoV-2 infection, do not rule out co-infections with other pathogens, and should not be used as the sole basis for treatment or other patient management decisions. Negative results must be combined with clinical observations, patient history, and epidemiological information. The expected result is Negative. Fact Sheet for Patients: SugarRoll.be Fact Sheet for Healthcare Providers: https://www.woods-mathews.com/ This test is not yet approved or cleared by the [place] FDA and  has been authorized for detection and/or diagnosis of SARS-CoV-2 by FDA under an Emergency Use Authorization (EUA). This EUA will remain  in effect (meaning this test can be used) for the duration of the COVID-19 declaration under Section 56 4(b)(1) of the Act, 21 U.S.C. section 360bbb-3(b)(1), unless the authorization is terminated or revoked sooner. Performed at [facility], [place] [address], [place], [place] [address]   MRSA PCR Screening     Status: None   Collection Time: 11/15/18  3:57 PM   Specimen: Nasopharyngeal  Result Value Ref Range Status   MRSA by PCR NEGATIVE NEGATIVE Final    Comment:        The GeneXpert MRSA Assay (FDA approved for NASAL specimens only), is one component of a comprehensive MRSA colonization surveillance program. It is not intended to diagnose MRSA infection nor to guide or monitor treatment for MRSA infections. Performed at [facility], [place] [address] [place], [place] [address]   Culture, blood (routine x 2)     Status: None (Preliminary result)   Collection Time: 11/15/18  5:30 PM   Specimen: BLOOD  Result Value Ref Range  Status   Specimen Description BLOOD RIGHT ANTECUBITAL  Final   Special Requests   Final    BOTTLES DRAWN AEROBIC ONLY Blood Culture adequate volume   Culture   Final    NO GROWTH 4 DAYS Performed at [facility], [place] [address], [place], [place] [address]    Report Status PENDING  Incomplete  Culture, blood (routine x 2)     Status: None (Preliminary result)   Collection Time: 11/15/18  5:47 PM   Specimen: BLOOD RIGHT HAND  Result Value Ref Range Status   Specimen Description BLOOD RIGHT HAND  Final   Special Requests   Final    BOTTLES DRAWN AEROBIC ONLY Blood Culture adequate volume   Culture   Final    NO GROWTH 4 DAYS Performed at [facility], [place] [address] [place], [place] [address]    Report Status PENDING  Incomplete  Aerobic/Anaerobic Culture (surgical/deep wound)     Status: Abnormal (Preliminary result)   Collection Time: 11/16/18  9:18 AM   Specimen: Abscess  Result Value Ref Range Status   Specimen Description ABSCESS GALL BLADDER  Final   Special Requests Normal  Final   Gram Stain   Final    FEW WBC PRESENT,BOTH PMN AND MONONUCLEAR RARE GRAM POSITIVE COCCI IN PAIRS RARE GRAM VARIABLE ROD Performed at [facility], [place] [address] [place], [place] [address]    Culture MULTIPLE ORGANISMS PRESENT, NONE PREDOMINANT (A)  Final   Report Status PENDING  Incomplete  Urine culture  Status: None   Collection Time: 11/18/18  5:44 AM   Specimen: In/Out Cath Urine  Result Value Ref Range Status   Specimen Description IN/OUT CATH URINE  Final   Special Requests NONE  Final   Culture   Final    NO GROWTH Performed at [facility], [place] [address], [place], [place] [address]    Report Status 11/19/2018 FINAL  Final         Radiology Studies: No results found.      Scheduled Meds: . Chlorhexidine Gluconate Cloth  6 each Topical Q0600  . cinacalcet  60 mg Oral Q supper  . darbepoetin (ARANESP) injection - DIALYSIS  60 mcg  Intravenous Q Fri-HD  . doxercalciferol  9 mcg Intravenous Q M,W,F-HD  . feeding supplement (PRO-STAT SUGAR FREE 64)  30 mL Oral BID  . gabapentin  300 mg Oral BID  . insulin aspart  0-9 Units Subcutaneous TID WC  . linagliptin  5 mg Oral Q breakfast  . mouth rinse  15 mL Mouth Rinse BID  . multivitamin  1 tablet Oral QHS  . sodium chloride flush  5 mL Intracatheter Q8H   Continuous Infusions: . sodium chloride 10 mL/hr at 11/17/18 2220  . ampicillin-sulbactam (UNASYN) IV 3 g (11/18/18 2158)     LOS: 5 days    Time spent: 25 minutes   Shana Chute, MD  [facility] 11/19/2018, 10:14 AM     Please page through [place]:  [email or web address] Password [email or web address] If 7PM-7AM, please contact night-coverage       [employer]

## 2018-11-19 NOTE — Interval H&P Note (Signed)
History and Physical Interval Note:  11/19/2018 2:12 PM  Edgar Brewer  has presented today for surgery, with the diagnosis of bacteremia.  The various methods of treatment have been discussed with the patient and family. After consideration of risks, benefits and other options for treatment, the patient has consented to  Procedure(s): TRANSESOPHAGEAL ECHOCARDIOGRAM (TEE) (N/A) as a surgical intervention.  The patient's history has been reviewed, patient examined, no change in status, stable for surgery.  I have reviewed the patient's chart and labs.  Questions were answered to the patient's satisfaction.     Mertie Moores

## 2018-11-19 NOTE — Progress Notes (Signed)
  Echocardiogram Echocardiogram Transesophageal has been performed.  Donni Oglesby L Androw 11/19/2018, 3:27 PM

## 2018-11-19 NOTE — Progress Notes (Addendum)
[facility] Progress Note   Subjective:   Seen and examined at bedside in room.  Feeling ok with mild RUQ pain.  No n/v this AM. Complains of itching in legs and back.   Objective Vitals:   11/19/18 0545 11/19/18 0546 11/19/18 0700 11/19/18 0817  BP: (!) 159/75   (!) 155/71  Pulse: 76 77 74 79  Resp:  10 16 19   Temp: 97.6 F (36.4 C)   98.1 F (36.7 C)  TempSrc: Oral   Oral  SpO2: 98% 99% 100% 99%  Weight:       Physical Exam General:NAD, WDWN Heart:RRR Lungs:CTAB Abdomen:soft, NTND, JP drain in RUQ - minimal serosanguinous drainage Extremities:no LE edema Dialysis Access: LU AVF +b   Filed Weights   11/16/18 1215 11/16/18 1624 11/18/18 0411  Weight: 88.2 kg 86.8 kg 86.6 kg    Intake/Output Summary (Last 24 hours) at 11/19/2018 1106 Last data filed at 11/19/2018 0547 Gross per 24 hour  Intake 755 ml  Output 15 ml  Net 740 ml    Additional Objective Labs: Basic Metabolic Panel: Recent Labs  Lab 11/17/18 0247 11/18/18 0241 11/19/18 0311  NA 136 135 137  K 3.7 3.3* 3.0*  CL 96* 95* 98  CO2 25 24 24   GLUCOSE 168* 166* 134*  BUN 21* 41* 51*  CREATININE 3.76* 4.96* 5.56*  CALCIUM 8.4* 8.5* 8.3*  PHOS  --   --  3.8   Liver Function Tests: Recent Labs  Lab 11/17/18 0247 11/18/18 0241 11/19/18 0311  AST 45* 28 42*  ALT 99* 73* 67*  ALKPHOS 815* 863* 798*  BILITOT 6.6* 6.3* 4.8*  PROT 6.5 6.4* 6.0*  ALBUMIN 2.3* 2.3* 2.1*   CBC: Recent Labs  Lab 11/14/18 1702 11/15/18 1210 11/16/18 0245 11/17/18 0247 11/18/18 0241 11/19/18 0311  WBC 7.7 12.1* 8.1 7.2 7.3 5.7  NEUTROABS 7.4  --   --   --   --   --   HGB 10.6* 9.2* 8.9* 9.6* 10.4* 9.7*  HCT 34.2* 29.3* 28.1* 29.8* 31.7* 29.5*  MCV 92.9 92.7 92.4 90.0 88.3 87.5  PLT 190 104* 107* 128* 175 201   CBG: Recent Labs  Lab 11/18/18 0805 11/18/18 1213 11/18/18 1626 11/19/18 0044 11/19/18 0822  GLUCAP 181* 215* 172* 153* 123*   Iron Studies: No results for input(s): IRON,  TIBC, TRANSFERRIN, FERRITIN in the last 72 hours. Lab Results  Component Value Date   INR 1.0 11/14/2018   INR 1.0 11/01/2018   INR 1.0 10/22/2018   Studies/Results: No results found.  Medications: . sodium chloride 10 mL/hr at 11/17/18 2220  . ampicillin-sulbactam (UNASYN) IV 3 g (11/18/18 2158)   . Chlorhexidine Gluconate Cloth  6 each Topical Q0600  . cinacalcet  60 mg Oral Q supper  . darbepoetin (ARANESP) injection - DIALYSIS  60 mcg Intravenous Q Fri-HD  . doxercalciferol  9 mcg Intravenous Q M,W,F-HD  . feeding supplement (PRO-STAT SUGAR FREE 64)  30 mL Oral BID  . gabapentin  300 mg Oral BID  . insulin aspart  0-9 Units Subcutaneous TID WC  . linagliptin  5 mg Oral Q breakfast  . mouth rinse  15 mL Mouth Rinse BID  . multivitamin  1 tablet Oral QHS  . sodium chloride flush  5 mL Intracatheter Q8H    Dialysis Orders: MWF -NW 4.25hrs, BFR400, EXH371, EDW 85.5kg,2K/2Ca  Access:LU AVF Heparin2600 unit bolus Mircera47mcg q4wks - last9/30 Hectorol16mcg IV qHD  Assessment/Plan: 1. Polymicrobial Bacteremia - BC +enterococcus,  E coli and klebsiella pneumonia. Afebrile x24hrs, tmax 103.1. ID following. On Unasyn, waiting for sensitives. Plans for TEE today.  2. Abscess at gallbladder fossa - identified on MRCP. Per surgery no indication of need for surgical intervention, following. IR placed JP drain today, 5cc purulent fluid aspirated initially, minimal output.  3. 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 Per primary. 4. ESRD - On HD MWF. Have postponed today's HD to 1st thing in am tomorrow. Pt request 5. Hypertension/volume - BP mostly well controlled, not on antihypertensives. Does not appear volume overloaded. Minimal UF today. K 3.0, use 4K bath. 6. Anemia of CKD - Hgb 9.7. Aranesp 23mcg on 10/23. 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 7. Secondary Hyperparathyroidism - Ca and phos in goal. Continue binders,  VDRA and sensipar.  8. Nutrition - Renal diet w/fluid restrictions once advanced. Vit, Nepro 9. DM - per primary 10. Itching - sarna lotion. Hydroxyzine when no longer NPO.   Jen Mow, PA-C [facility] Pager: [phone] 11/19/2018,11:06 AM  LOS: 5 days   Pt seen, examined and agree w assess/plan as above with additions as indicated.  [facility] 11/19/2018, 12:45 PM

## 2018-11-19 NOTE — Progress Notes (Signed)
ID PROGRESS NOTE   58yo M with polymicrobrial bacteremia thought to be due to intra-abdominal abscess s/p aspiration, including enterococcus  TEE negative for vegetation but found to have decreased EF of 40%  - recommend to get cardiology to evaluate if need to optimize his current regimen for cardiomyopathy management - in terms of abtx, continue on amp/sub for polymicrobial bacteremia -recommend 3 more days of abtx to complete 7 day course post source control.  Ed[facility] Brewer for Infectious Diseases [phone]

## 2018-11-19 NOTE — Care Management Important Message (Signed)
Important Message  Patient Details  Name: Edgar Brewer MRN: [id number] Date of Birth: Jun 12, 1960   Medicare Important Message Given:  Yes     Lakiya Cottam 11/19/2018, 4:06 PM

## 2018-11-19 NOTE — Progress Notes (Signed)
Patient ID: Edgar Brewer, male   DOB: 26-Sep-1960, 58 y.o.   MRN: [id number]       Subjective: No complaints today except NPO and doesn't want to do HD and procedure at same time because he is hungry.    Objective: Vital signs in last 24 hours: Temp:  [97.6 F (36.4 C)-99 F (37.2 C)] 98.1 F (36.7 C) (10/26 0817) Pulse Rate:  [74-92] 79 (10/26 0817) Resp:  [0-21] 19 (10/26 0817) BP: (147-159)/(68-75) 155/71 (10/26 0817) SpO2:  [90 %-100 %] 99 % (10/26 0817) Last BM Date: 11/17/18  Intake/Output from previous day: 10/25 0701 - 10/26 0700 In: 805 [P.O.:690; I.V.:5; IV Piggyback:100] Out: 15 [Drains:15] Intake/Output this shift: No intake/output data recorded.  PE: Abd: soft, NT, ND, +BS,. JP drain with minimal serous output  Lab Results:  Recent Labs    11/18/18 0241 11/19/18 0311  WBC 7.3 5.7  HGB 10.4* 9.7*  HCT 31.7* 29.5*  PLT 175 201   BMET Recent Labs    11/18/18 0241 11/19/18 0311  NA 135 137  K 3.3* 3.0*  CL 95* 98  CO2 24 24  GLUCOSE 166* 134*  BUN 41* 51*  CREATININE 4.96* 5.56*  CALCIUM 8.5* 8.3*   PT/INR No results for input(s): LABPROT, INR in the last 72 hours. CMP     Component Value Date/Time   NA 137 11/19/2018 0311   NA 140 03/04/2016 1317   K 3.0 (L) 11/19/2018 0311   CL 98 11/19/2018 0311   CO2 24 11/19/2018 0311   GLUCOSE 134 (H) 11/19/2018 0311   BUN 51 (H) 11/19/2018 0311   BUN 47 (H) 03/04/2016 1317   CREATININE 5.56 (H) 11/19/2018 0311   CALCIUM 8.3 (L) 11/19/2018 0311   PROT 6.0 (L) 11/19/2018 0311   PROT 5.2 (L) 02/29/2016 1439   ALBUMIN 2.1 (L) 11/19/2018 0311   ALBUMIN 3.1 (L) 02/29/2016 1439   AST 42 (H) 11/19/2018 0311   ALT 67 (H) 11/19/2018 0311   ALKPHOS 798 (H) 11/19/2018 0311   BILITOT 4.8 (H) 11/19/2018 0311   BILITOT 0.2 02/29/2016 1439   GFRNONAA 10 (L) 11/19/2018 0311   GFRAA 12 (L) 11/19/2018 0311   Lipase     Component Value Date/Time   LIPASE 45 10/18/2018 2358       Studies/Results: No  results found.  Anti-infectives: Anti-infectives (From admission, onward)   Start     Dose/Rate Route Frequency Ordered Stop   11/16/18 1000  cefTRIAXone (ROCEPHIN) 2 g in sodium chloride 0.9 % 100 mL IVPB  Status:  Discontinued     2 g 200 mL/hr over 30 Minutes Intravenous Every 24 hours 11/15/18 1711 11/16/18 1111   11/15/18 2000  Ampicillin-Sulbactam (UNASYN) 3 g in sodium chloride 0.9 % 100 mL IVPB     3 g 200 mL/hr over 30 Minutes Intravenous Every 24 hours 11/15/18 1527     11/15/18 0800  cefTRIAXone (ROCEPHIN) 2 g in sodium chloride 0.9 % 100 mL IVPB  Status:  Discontinued     2 g 200 mL/hr over 30 Minutes Intravenous Daily 11/15/18 0625 11/15/18 1509   11/15/18 0700  ampicillin (OMNIPEN) 2 g in sodium chloride 0.9 % 100 mL IVPB  Status:  Discontinued     2 g 300 mL/hr over 20 Minutes Intravenous Every 12 hours 11/15/18 0625 11/15/18 1509   11/15/18 0600  metroNIDAZOLE (FLAGYL) IVPB 500 mg  Status:  Discontinued     500 mg 100 mL/hr over 60 Minutes Intravenous Every  8 hours 11/14/18 2308 11/15/18 1509   11/14/18 1845  ceFEPIme (MAXIPIME) 2 g in sodium chloride 0.9 % 100 mL IVPB  Status:  Discontinued     2 g 200 mL/hr over 30 Minutes Intravenous  Once 11/14/18 1837 11/14/18 1841   11/14/18 1845  metroNIDAZOLE (FLAGYL) IVPB 500 mg     500 mg 100 mL/hr over 60 Minutes Intravenous  Once 11/14/18 1837 11/14/18 2040   11/14/18 1845  ceFEPIme (MAXIPIME) 2 g in sodium chloride 0.9 % 100 mL IVPB  Status:  Discontinued     2 g 200 mL/hr over 30 Minutes Intravenous Every 24 hours 11/14/18 1841 11/14/18 1842   11/14/18 1845  ceFEPIme (MAXIPIME) 1 g in sodium chloride 0.9 % 100 mL IVPB  Status:  Discontinued     1 g 200 mL/hr over 30 Minutes Intravenous Every 24 hours 11/14/18 1842 11/15/18 7014       Assessment/Plan Polymicrobial bacteremia - per medicine MMP - per medicine/renal, etc 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 Kieth Brightly with small  gallbladder fossa fluid collection -s/p perc drain today by IR.  Drain culture shows multiple organisms, none predominant -has been on regular diet with no issues -will need drain clinic follow up and follow up with Dr. Kieth Brightly -defer further care to medical service -abx therapy will be driven by bacteremia and not gallbladder fossa collection, so defer duration to medical team/ID  FEN - ok for diet from our standpoint VTE - heparin ID - Unasyn Follow up - [place]/IR drain clinic  Dispo: no further surgical needs at this time.  He can be discharged with drain in place and follow up in IR drain clinic and follow up with Dr. Kieth Brightly will be arranged.  We will sign off.  Call as needed.   LOS: 5 days    Edgar Brewer , [facility] Surgery 11/19/2018, 9:52 AM Please see Amion for pager number during day hours 7:00am-4:30pm

## 2018-11-20 ENCOUNTER — Encounter (HOSPITAL_COMMUNITY): Payer: Self-pay | Admitting: Cardiovascular Disease

## 2018-11-20 LAB — CBC
HCT: 29.6 % — ABNORMAL LOW (ref 39.0–52.0)
Hemoglobin: 9.7 g/dL — ABNORMAL LOW (ref 13.0–17.0)
MCH: 29 pg (ref 26.0–34.0)
MCHC: 32.8 g/dL (ref 30.0–36.0)
MCV: 88.4 fL (ref 80.0–100.0)
Platelets: 220 10*3/uL (ref 150–400)
RBC: 3.35 MIL/uL — ABNORMAL LOW (ref 4.22–5.81)
RDW: 15.3 % (ref 11.5–15.5)
WBC: 7.9 10*3/uL (ref 4.0–10.5)
nRBC: 0 % (ref 0.0–0.2)

## 2018-11-20 LAB — COMPREHENSIVE METABOLIC PANEL
ALT: 91 U/L — ABNORMAL HIGH (ref 0–44)
AST: 91 U/L — ABNORMAL HIGH (ref 15–41)
Albumin: 2.1 g/dL — ABNORMAL LOW (ref 3.5–5.0)
Alkaline Phosphatase: 787 U/L — ABNORMAL HIGH (ref 38–126)
Anion gap: 13 (ref 5–15)
BUN: 46 mg/dL — ABNORMAL HIGH (ref 6–20)
CO2: 25 mmol/L (ref 22–32)
Calcium: 7.7 mg/dL — ABNORMAL LOW (ref 8.9–10.3)
Chloride: 97 mmol/L — ABNORMAL LOW (ref 98–111)
Creatinine, Ser: 5.79 mg/dL — ABNORMAL HIGH (ref 0.61–1.24)
GFR calc Af Amer: 11 mL/min — ABNORMAL LOW (ref >60)
GFR calc non Af Amer: 10 mL/min — ABNORMAL LOW (ref >60)
Glucose, Bld: 216 mg/dL — ABNORMAL HIGH (ref 70–99)
Potassium: 3.3 mmol/L — ABNORMAL LOW (ref 3.5–5.1)
Sodium: 135 mmol/L (ref 135–145)
Total Bilirubin: 4.1 mg/dL — ABNORMAL HIGH (ref 0.3–1.2)
Total Protein: 5.5 g/dL — ABNORMAL LOW (ref 6.5–8.1)

## 2018-11-20 LAB — CULTURE, BLOOD (ROUTINE X 2)
Culture: NO GROWTH
Culture: NO GROWTH
Special Requests: ADEQUATE
Special Requests: ADEQUATE

## 2018-11-20 LAB — GLUCOSE, CAPILLARY
Glucose-Capillary: 134 mg/dL — ABNORMAL HIGH (ref 70–99)
Glucose-Capillary: 179 mg/dL — ABNORMAL HIGH (ref 70–99)

## 2018-11-20 MED ORDER — CEFAZOLIN SODIUM-DEXTROSE 2-4 GM/100ML-% IV SOLN
2.0000 g | INTRAVENOUS | Status: DC
  Filled 2018-11-20: qty 100

## 2018-11-20 MED ORDER — LINEZOLID 600 MG PO TABS
600.0000 mg | ORAL_TABLET | Freq: Two times a day (BID) | ORAL | Status: DC
  Administered 2018-11-20: 15:00:00 600 mg via ORAL
  Filled 2018-11-20 (×2): qty 1

## 2018-11-20 MED ORDER — CEFAZOLIN SODIUM-DEXTROSE 2-4 GM/100ML-% IV SOLN
2.0000 g | INTRAVENOUS | Status: DC
  Administered 2018-11-20: 2 g via INTRAVENOUS
  Filled 2018-11-20: qty 100

## 2018-11-20 MED ORDER — LINEZOLID 600 MG PO TABS: 600.0000 mg | ORAL_TABLET | Freq: Two times a day (BID) | ORAL | 0 refills | Status: DC

## 2018-11-20 MED ORDER — CARVEDILOL 6.25 MG PO TABS: 6.2500 mg | ORAL_TABLET | Freq: Two times a day (BID) | ORAL | 0 refills | Status: DC

## 2018-11-20 MED ORDER — CHLORHEXIDINE GLUCONATE CLOTH 2 % EX PADS
6.0000 | MEDICATED_PAD | Freq: Every day | CUTANEOUS | Status: DC
  Administered 2018-11-20: 13:00:00 6 via TOPICAL

## 2018-11-20 MED ORDER — CEFAZOLIN SODIUM-DEXTROSE 2-4 GM/100ML-% IV SOLN: 2.0000 g | Freq: Once | INTRAVENOUS | Status: AC

## 2018-11-20 MED ORDER — LINEZOLID 600 MG PO TABS: 600.0000 mg | ORAL_TABLET | Freq: Two times a day (BID) | ORAL | 0 refills | Status: AC

## 2018-11-20 MED FILL — LINEZOLID 600 MG TABS: 600 | 7 days supply | Qty: 14 | Fill #0

## 2018-11-20 MED FILL — CARVEDILOL 6.25 MG TABLET: 6.25 | 30 days supply | Qty: 60 | Fill #0

## 2018-11-20 NOTE — Progress Notes (Signed)
Linezolid and coreg home medications delivered to patients bedside by pharmacy.  Patient and wife requesting to stay another night since he will need HD in the morning.  Wife will take medications home this evening.

## 2018-11-20 NOTE — Care Management (Signed)
Benefit check submitted for Linezolid as requested.  TOC will continue to follow

## 2018-11-20 NOTE — Progress Notes (Signed)
MD came to bedside and spoke with patient and wife who are now agreeable to discharge.  Completed ancef and first zyvox dose.  Educated on jp drain care. Patient discharge teaching given, including activity, diet, follow-up appoints, and medications. Patient and wife verbalized understanding of all discharge instructions. IV access was d/c'd. Vitals are stable. Skin is intact except as charted in most recent assessments. Pt to be escorted out by NT, to be driven home by family.

## 2018-11-20 NOTE — Progress Notes (Addendum)
[facility] Progress Note   Subjective:   Patient seen and examined at bedside in dialysis.  No new complaints.  Tolerating HD well.   Objective Vitals:   11/20/18 0730 11/20/18 0800 11/20/18 0830 11/20/18 0900  BP: (!) 144/70 125/67 (!) 147/70 132/71  Pulse: 64 66 69 69  Resp:      Temp:      TempSrc:      SpO2:      Weight:       Physical Exam General:NAD, chronically ill appearing male, laying in bed Heart:RRR Lungs:CTAB A/P Abdomen:soft, JP drain in RUQ with minimal drainage Extremities:no LE edema Dialysis Access: LU AVF accessed   [facility] Weights   11/16/18 1624 11/18/18 0411 11/20/18 0715  Weight: 86.8 kg 86.6 kg 83.4 kg    Intake/Output Summary (Last 24 hours) at 11/20/2018 0950 Last data filed at 11/20/2018 6606 Gross per 24 hour  Intake 350 ml  Output 10 ml  Net 340 ml    Additional Objective Labs: Basic Metabolic Panel: Recent Labs  Lab 11/18/18 0241 11/19/18 0311 11/20/18 0320  NA 135 137 135  K 3.3* 3.0* 3.3*  CL 95* 98 97*  CO2 24 24 25   GLUCOSE 166* 134* 216*  BUN 41* 51* 46*  CREATININE 4.96* 5.56* 5.79*  CALCIUM 8.5* 8.3* 7.7*  PHOS  --  3.8  --    Liver Function Tests: Recent Labs  Lab 11/18/18 0241 11/19/18 0311 11/20/18 0320  AST 28 42* 91*  ALT 73* 67* 91*  ALKPHOS 863* 798* 787*  BILITOT 6.3* 4.8* 4.1*  PROT 6.4* 6.0* 5.5*  ALBUMIN 2.3* 2.1* 2.1*   CBC: Recent Labs  Lab 11/14/18 1702  11/16/18 0245 11/17/18 0247 11/18/18 0241 11/19/18 0311 11/20/18 0320  WBC 7.7   < > 8.1 7.2 7.3 5.7 7.9  NEUTROABS 7.4  --   --   --   --   --   --   HGB 10.6*   < > 8.9* 9.6* 10.4* 9.7* 9.7*  HCT 34.2*   < > 28.1* 29.8* 31.7* 29.5* 29.6*  MCV 92.9   < > 92.4 90.0 88.3 87.5 88.4  PLT 190   < > 107* 128* 175 201 220   < > = values in this interval not displayed.   Blood Culture    Component Value Date/Time   SDES IN/OUT CATH URINE 11/18/2018 0544   SPECREQUEST NONE 11/18/2018 0544   CULT  11/18/2018 0544    NO  GROWTH Performed at [facility], [place] [address] [place], [place] [address]    REPTSTATUS 11/19/2018 FINAL 11/18/2018 0544    CBG: Recent Labs  Lab 11/19/18 0044 11/19/18 0822 11/19/18 1159 11/19/18 1716 11/19/18 2152  GLUCAP 153* 123* 125* 94 252*    Medications: . sodium chloride 10 mL/hr at 11/17/18 2220  . ampicillin-sulbactam (UNASYN) IV 3 g (11/19/18 2036)   . Chlorhexidine Gluconate Cloth  6 each Topical Q0600  . cinacalcet  60 mg Oral Q supper  . darbepoetin (ARANESP) injection - DIALYSIS  60 mcg Intravenous Q Fri-HD  . doxercalciferol  9 mcg Intravenous Q M,W,F-HD  . feeding supplement (PRO-STAT SUGAR FREE 64)  30 mL Oral BID  . gabapentin  300 mg Oral BID  . insulin aspart  0-9 Units Subcutaneous TID WC  . linagliptin  5 mg Oral Q breakfast  . mouth rinse  15 mL Mouth Rinse BID  . multivitamin  1 tablet Oral QHS  . sodium chloride  flush  5 mL Intracatheter Q8H    Dialysis Orders: MWF -NW 4.25hrs, BFR400, QRF758, EDW 85.5kg,2K/2Ca  Access:LU AVF Heparin2600 unit bolus Mircera29mcg q4wks - last9/30 Hectorol56mcg IV qHD  Assessment/Plan: 1. Polymicrobial Bacteremia - BC +enterococcus, E coli and klebsiella pneumonia.Afebrilex24hrs, tmax 103.1. ID following. On Unasyn, waiting for sensitives. TEE on 10/26 negative for vegetations, EF 40%.   2. Abscess at gallbladder fossa - identified on MRCP. Per surgery no indication of need for surgical intervention, following.IR placed JP drain on 10/23, 5cc purulent fluid aspirated initially, minimal output. 3. 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 Per primary. 4. ESRD - On HD MWF.HD today off schedule due to TEE yesterday.  Orders written for tomorrow to keep on schedule.  K 3.3, use 4K bath.  5. Hypertension/volume - BPmostly well controlled, not on antihypertensives. Does not appear volume overloaded.Minimal UF today. Under EDW, get standing weights  to better assess.  6. Anemia of CKD - Hgb 9.7.Aranesp 20mcg on 10/23.No iron due to possible hemosiderosis. 7. Secondary Hyperparathyroidism - CorrectedCa and phos in goal. Continue binders, VDRA and sensipar.  8. Nutrition - Renal diet w/fluid restrictions once advanced.Vit, Nepro 9. DM - per primary 10. Itching - sarna lotion, Hydroxyzine.  Jen Mow, PA-C [facility] Pager: [phone] 11/20/2018,9:50 AM  LOS: 6 days   Pt seen, examined and agree w A/P as above.  Kelly Splinter  MD 11/20/2018, 11:25 AM

## 2018-11-20 NOTE — Progress Notes (Signed)
    [place] for Infectious Disease    Date of Admission:  11/14/2018   Total days of antibiotics 7           ID: Edgar Brewer is a 58 y.o. male with   Principal Problem:   Intra-abdominal abscess ([place]) Active Problems:   ESRD (end stage renal disease) on dialysis ([place])   Elevated LFTs   Sepsis ([place])   Acute respiratory failure with hypoxia ([facility])   Gallbladder abscess    Subjective: Afebrile, tolerated HD without difficulty   Medications:  . Chlorhexidine Gluconate Cloth  6 each Topical Q0600  . Chlorhexidine Gluconate Cloth  6 each Topical Q0600  . cinacalcet  60 mg Oral Q supper  . darbepoetin (ARANESP) injection - DIALYSIS  60 mcg Intravenous Q Fri-HD  . doxercalciferol  9 mcg Intravenous Q M,W,F-HD  . feeding supplement (PRO-STAT SUGAR FREE 64)  30 mL Oral BID  . gabapentin  300 mg Oral BID  . insulin aspart  0-9 Units Subcutaneous TID WC  . linagliptin  5 mg Oral Q breakfast  . mouth rinse  15 mL Mouth Rinse BID  . multivitamin  1 tablet Oral QHS  . sodium chloride flush  5 mL Intracatheter Q8H    Objective: Vital signs in last 24 hours: Temp:  [97.9 F (36.6 C)-98.7 F (37.1 C)] 98.7 F (37.1 C) (10/27 1230) Pulse Rate:  [64-81] 75 (10/27 1230) Resp:  [9-21] 18 (10/27 1230) BP: (121-178)/(58-92) 123/61 (10/27 1230) SpO2:  [94 %-100 %] 97 % (10/27 1230) Weight:  [81.4 kg-83.4 kg] 81.4 kg (10/27 1105)     Lab Results Recent Labs    11/19/18 0311 11/20/18 0320  WBC 5.7 7.9  HGB 9.7* 9.7*  HCT 29.5* 29.6*  NA 137 135  K 3.0* 3.3*  CL 98 97*  CO2 24 25  BUN 51* 46*  CREATININE 5.56* 5.79*   Liver Panel Recent Labs    11/19/18 0311 11/20/18 0320  PROT 6.0* 5.5*  ALBUMIN 2.1* 2.1*  AST 42* 91*  ALT 67* 91*  ALKPHOS 798* 787*  BILITOT 4.8* 4.1*    Microbiology: reviewed Studies/Results: No results found.   Assessment/Plan:  Polymicrobial bacteremia due to kleb, ecoli, and enterococcus thought to be 2/2 intra-abdominal abscess  that has subsequently been drained.   - will need add 3 doses of cefazolin post HD (roughly 10d of tx) - will also give 7 days of linezolid 600mg  PO BID to finish out  2 wk course of enterococcus  - endocarditis has been ruled out  [facility] for Infectious Diseases Cell: [phone] Pager: [phone]  11/20/2018, 12:37 PM

## 2018-11-20 NOTE — Progress Notes (Signed)
Pt transported to dialysis by transporter. Notified telemetry that pt going to dialysis to bay #7. Will continue to monitor pt. Ranelle Oyster, RN

## 2018-11-20 NOTE — TOC Benefit Eligibility Note (Signed)
Transition of Care (TOC) Benefit Eligibility Note    Patient Details  Name: Edgar Brewer MRN: [id number] Date of Birth: 12/06/1960   Medication/Dose: Linezolid 600 mg twice a day  Covered?: Yes  Tier: (No tier listed)  Prescription [facility]age [facility]  Spoke with Person/Company/Phone Number:: Joanne/[employer]/ [phone]  Co-Pay: No Co payment     Deductible: Met         Phone Number: 11/20/2018, 2:31 PM     

## 2018-11-20 NOTE — Discharge Summary (Signed)
Physician Discharge Summary  Edgar Brewer MEQ:[id number] DOB: 1960-11-16 DOA: 11/14/2018  PCP: Vassie Moment, MD  Admit date: 11/14/2018 Discharge date: 11/20/2018  Admitted From: Home  Disposition:  Home   Recommendations for Outpatient Follow-up:  1. Follow up with General Surgery in 1 week, scheduled 2. Follow up with Dr. Lavonia Drafts PCP in 1 week 3. Follow up with Dr. Silverio Decamp as previously directed 4. Dr. Lavonia Drafts: Please review BP and repeat CBC and LFTs in 1 week 5. Dr. [name]: Please review Echo reports re: discrepancy in EF between TTE and TEE 6. Dr. Silverio Decamp: Please review repeat LFTs and ferritin      Home Health: None  Equipment/Devices: JP drain  Discharge Condition: Fair  CODE STATUS: FULL Diet recommendation: Renal, diabetic  Brief/Interim Summary: Edgar Brewer is a 58 y.o. M with ESRD on HD, DM, HTN, dCHF, pHTN, and recent choledocholithiasis with lap chole on 10/04/18 who presented with 1 day new confusion and fever and RUQ pain.  Patient had presented 3 weeks prior to this admission, 1 week post-op with N/V and confusion, found to have a 3cm fluid collection adjacent to liver.  HIDA negative for leak, Gen Surg consulted, treated conservatively with empiric antibiotics and this was thought to be an uninfected post-op seroma.  Since discharge, has had persistent nausea, vomiting, malaise, no real change.  Now fatigue worse, confused, feverish.  In the ER, temp 103F, tachycardic, encphealopathic, SpO2 88%.  AST 187, Tbili 7.0.  CT shows 4x3x3 cm abscess.  BCID with Klebsiella, enterococci and E coli.     PRINCIPAL HOSPITAL DIAGNOSIS: Sepsis     Discharge Diagnoses:   Sepsis from cholangitis Klebsiella, E-Coli and enterococcus bacteremia from cholangitis   S/p gallbladder fossa percutaneous aspiration and drainage Presented with Charcot's triad as well as AMS and acute hypoxic respiratory failure from sepsis with tachycardia and fever.  CT showed increase in size of  previously known gall-bladder bed fluid collection and mild dilated CBD with surrounding stranding.   MRCP ruled out CBD stone, pancreatic or biliary tree mass.  Started on broad spectrum antibiotics. Sepsis physiology resolved.    Blood cultures grew E-Coli, klebsiella and Enterococcus and he was transitioned to Unasyn, completing 8 days in hospital.  ID were consulted, he underwent TTE and TEE that were negative for vegetation.  ID recommended 10 total days coverage of E coli and Klebsiella, to end with Cefazolin on day of discharge (after HD) and cefazolin 2g IV tomorrow after HD, as well as 7 more days Linezolid to complete 14 days therapy for enterococcus bacteremia.  WBC now normalized, drain output minimal, surgery have recommended to leave drain until Surgery clinic follow up, at which time they will decide about repeat imaging and removal of drain.      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  Previous hyperferritinemia was suspected to be from inflammation.  Synthetic funtion near normal: albumin, INR near normal.  Ammonia only minimally elevated, doubt hepatic encephalopathy I assume this is cholangitis or cholestasis from an infected seroma. -Outpatient follow up with Dr. Silverio Decamp as previously planned  Discordant EF measurements Recent TTE by primary Cardiologist, repeat TTE here both showed normal EF.  TEE reading suggested EF 40%.  I suspect this latter is spurious.   -Recommend Cardiology outpatient follow up  Acute metabolic encephalopathy Resolved  ESRD  Hypertension Chronic diastolic CHF Pulmonary hypertension  Diabetes  Anemia of CKD Baseline Hgb 9-11, stable relative to baseline  Discharge Instructions  Discharge Instructions    Discharge instructions   Complete by: As directed    From Dr. Loleta Books: You were admitted for infection in your bloodstream.   This infection  likely originated from your liver area, possibly the fluid collection that has been there, possibly just ascending up from the intestines into what is left of your bile ducts (from where your gall bladder was removed).  Regardless, you had E coli, Klebsiella and enterococcus growing in your bloodstream.  You were treated with antibiotics and a drain was placed in the fluid collection where your gallbladder was taken out.  To finish the antibiotics: Your dialysis center will give you intravenous cefazolin tomorrow to complete 10 days of this medicine. You should take linezolid 600 mg twice daily for the next 7 days  Follow up with the surgeons in 1 week (Dr. Kieth Brightly, his information is below, the appointment is already scheduled)  Follow up with your primary care doctor as well Have the primary care doctor check your blood pressure and blood counts and make sure you are feeling well  Follow up with Dr. [name] in 1 week While you were here, your blood pressure was high, so start the medicine carvedilol 6.25 mg twice daily (you should continue this long term, if Dr. [name] approves, he can refill for you)  Lastly, make sure you have a follow up appointment with Dr. Silverio Decamp in the next 2 months   Increase activity slowly   Complete by: As directed      Allergies as of 11/20/2018      Reactions   Codeine Anaphylaxis, Hives, Swelling, Other (See Comments)   Swelling all over body and the throat      Medication List    STOP taking these medications   traMADol 50 MG tablet Commonly known as: ULTRAM     TAKE these medications   b complex-vitamin c-folic acid 0.8 MG Tabs tablet Take 1 tablet by mouth daily.   carvedilol 6.25 MG tablet Commonly known as: Coreg Take 1 tablet (6.25 mg total) by mouth 2 (two) times daily.   ceFAZolin 2-4 GM/100ML-% IVPB Commonly known as: ANCEF Inject 100 mLs (2 g total) into the vein once for 1 dose. After dialysis 10/28   cinacalcet 60 MG  tablet Commonly known as: SENSIPAR Take 60 mg by mouth daily. What changed: Another medication with the same name was removed. Continue taking this medication, and follow the directions you see here.   doxercalciferol 4 MCG/2ML injection Commonly known as: HECTOROL Inject 5 mLs (10 mcg total) into the vein every Monday, Wednesday, and Friday with hemodialysis.   gabapentin 300 MG capsule Commonly known as: NEURONTIN Take 300 mg by mouth See admin instructions. Take 300 mg by mouth in the morning and 300 mg at bedtime   ibuprofen 200 MG tablet Commonly known as: ADVIL Take 200-800 mg by mouth every 8 (eight) hours as needed for headache or mild pain.   linezolid 600 MG tablet Commonly known as: ZYVOX Take 1 tablet (600 mg total) by mouth 2 (two) times daily. Notes to patient: DUPLICATE   linezolid 762 MG tablet Commonly known as: ZYVOX Take 1 tablet (600 mg total) by mouth every 12 (twelve) hours for 7 days.   omeprazole 20 MG capsule Commonly known as: PRILOSEC Take 20 mg by mouth daily before breakfast.   ondansetron 4 MG tablet Commonly known as: ZOFRAN Take 1 tablet (4 mg total) by mouth every 6 (six) hours as  needed for nausea. What changed: reasons to take this   sevelamer carbonate 800 MG tablet Commonly known as: RENVELA Take 2 tablets (1,600 mg total) by mouth 3 (three) times daily with meals.   Tradjenta 5 MG Tabs tablet Generic drug: linagliptin Take 5 mg by mouth daily.      Follow-up Information    K[place]inger, Arta Bruce, MD Follow up on 12/14/2018.   Specialty: General Surgery Why: 11:00am, arrive by 10:30am for paperwork and check in Contact information: [address] [place] [address] [phone]        Vassie Moment, MD On 11/26/2018.   Specialty: Family Medicine Why: Monday 11/26/2018 @ 10:00am Contact information: [place] [address] [phone]        Josue Hector, MD Follow up.   Specialty:  Cardiology Contact information: [address] [place] [address] [phone]        Mauri Pole, MD Follow up.   Specialty: Gastroenterology Contact information: [place] [address] [phone]          Allergies  Allergen Reactions  . Codeine Anaphylaxis, Hives, Swelling and Other (See Comments)    Swelling all over body and the throat    Consultations:  Nephrology  IR  General Surgery  ID   Procedures/Studies: Ct Chest Wo Contrast  Result Date: 11/15/2018 CLINICAL DATA:  Shortness of breath, fever EXAM: CT CHEST WITHOUT CONTRAST TECHNIQUE: Multidetector CT imaging of the chest was performed following the standard protocol without IV contrast. COMPARISON:  None. FINDINGS: Cardiovascular: There are no significant vascular findings. There is a minimal pericardial effusion seen. Coronary artery calcifications are noted. Scattered aortic atherosclerotic calcifications are seen without aneurysmal dilatation. Mediastinum/Nodes: There are no enlarged mediastinal, hilar or axillary lymph nodes. The thyroid gland, trachea and esophagus demonstrate no significant findings. Lungs/Pleura: Patchy/streaky area of airspace opacities are seen at the posterior bilateral lower lungs. There are small amount of air bronchogram seen within this region, left greater than right. No pneumothorax or pleural effusion. Upper abdomen: Partially visualized low-density collection with foci of air within the gallbladder bed. A small hiatal hernia is present. Musculoskeletal/Chest wall: There is no chest wall mass or suspicious osseous finding. No acute osseous abnormality IMPRESSION: 1. Mild patchy airspace disease in the posterior lower lungs, left greater than right. This likely represents atelectasis, less likely atypical infectious etiology. 2. Trace pericardial effusion 3.  Aortic Atherosclerosis (ICD10-I70.0). Electronically Signed   By: Prudencio Pair M.D.   On: 11/15/2018 01:07   Ct Abdomen Pelvis W Contrast  Addendum Date: 11/14/2018   ADDENDUM REPORT: 11/14/2018 23:28 ADDENDUM: The mild biliary ductal dilatation reported in the findings, appears to be new since the prior exam. There is also a new mild common bile duct dilatation measuring up to 0.2 cm with mild surrounding fat stranding changes. This could be due to choledocholithiasis. Electronically Signed   By: Prudencio Pair M.D.   On: 11/14/2018 23:28   Result Date: 11/14/2018 CLINICAL DATA:  Nausea vomiting abdominal pain, fever EXAM: CT ABDOMEN AND PELVIS WITH CONTRAST TECHNIQUE: Multidetector CT imaging of the abdomen and pelvis was performed using the standard protocol following bolus administration of intravenous contrast. CONTRAST:  113mL OMNIPAQUE IOHEXOL 300 MG/ML  SOLN COMPARISON:  October 18, 2018 FINDINGS: Lower chest: The visualized heart size within normal limits. No pericardial fluid/thickening. No hiatal hernia. There is patchy/streaky airspace opacity at both lung bases, slightly increased from the  prior exam. Hepatobiliary: Within the gallbladder bed there is a 4.1 x 2.8 x 2.7 cm low-density collection now slightly increased in size from the prior exam with a small foci of air. There is mild intrahepatic biliary ductal dilatation. There does appear to be some fat stranding changes seen within the gallbladder bed. No other focal lesions seen.The main portal vein is patent. The patient is status post cholecystectomy. Pancreas: Unremarkable. No pancreatic ductal dilatation or surrounding inflammatory changes. Spleen: Normal in size without focal abnormality. Adrenals/Urinary Tract: Both adrenal glands appear normal. The kidneys and collecting system appear normal without evidence of urinary tract calculus or hydronephrosis. Bladder is unremarkable. Again noted are bilateral perinephric stranding. Stomach/Bowel: The stomach, small bowel, and colon are normal in appearance. No  inflammatory changes, wall thickening, or obstructive findings.The appendix is normal. Vascular/Lymphatic: There are no enlarged mesenteric, retroperitoneal, or pelvic lymph nodes. Scattered aortic atherosclerotic calcifications are seen without aneurysmal dilatation. Reproductive: The prostate is unremarkable. Other: No evidence of abdominal wall mass or hernia. Musculoskeletal: No acute or significant osseous findings. IMPRESSION: 1. Interval slight increase in the fluid collection within the gallbladder bed now with a small foci of air, measuring 4.1 x 2.8 x 2.7 cm. Given the patient's history this could represent a infected collection within the liver/gallbladder bed. 2. Slight interval increase in the patchy/streaky airspace opacities at both lung bases which could represent atelectasis and/or atypical infectious etiology. Electronically Signed: By: Prudencio Pair M.D. On: 11/14/2018 20:10   Mr Abdomen Mrcp Wo Contrast  Result Date: 11/15/2018 CLINICAL DATA:  Jaundice, abdominal pain. Elevated liver function tests, nausea, and vomiting. Laparoscopic cholecystectomy on 10/04/2018. Mild biliary dilatation on CT of 11/14/2018, query choledocholithiasis. EXAM: MRI ABDOMEN WITHOUT CONTRAST  (INCLUDING MRCP) TECHNIQUE: Multiplanar multisequence MR imaging of the abdomen was performed. Heavily T2-weighted images of the biliary and pancreatic ducts were obtained, and three-dimensional MRCP images were rendered by post processing. COMPARISON:  11/14/2018 FINDINGS: Lower chest: Mild airspace opacities in the posterior basal segments of both lower lobes. Mild cardiomegaly. Hepatobiliary: Periportal edema. Small amount of gas and fluid in the gallbladder fossa at the site of cholecystectomy. This gas is abnormal 12 days out from surgery. Mild reduction of hepatic parenchymal signal on inphase images compared to out of phase images, along with reduced T2 signal in the spleen, suspicious for hemosiderosis. Currently there  is no biliary dilatation. Pancreas:  Pancreas divisum. No findings of acute pancreatitis. Spleen: Less than expected T2 signal and less than expected in phase signal in the spleen, suspicious for hemosiderosis. Adrenals/Urinary Tract: 4 mm fluid signal intensity lesion of the left mid kidney on image 29/4, likely a cyst but too small to characterize. The adrenal glands appear normal. No hydronephrosis. Minimal perirenal stranding bilaterally similar to recent CT appearance. Stomach/Bowel: Diverticulum of the transverse duodenum. Numerous scattered colonic diverticula. Vascular/Lymphatic: Aortoiliac atherosclerotic vascular disease. No pathologic adenopathy identified. Other:  No supplemental non-categorized findings. Musculoskeletal: There is evidence of lumbar degenerative disc disease. IMPRESSION: 1. Small amount of gas and fluid in the gallbladder fossa at the site of cholecystectomy. This gas is abnormal 12 days out from surgery, and the possibility of a small abscess along the gallbladder fossa is raised. 2. No current biliary dilatation. There is nonspecific periportal edema. 3. 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 4. Pancreas divisum. 5. Mild airspace opacities in the posterior basal segments of both lower lobes. 6. Mild cardiomegaly. 7. Colonic diverticulosis. 8.  Aortic Atherosclerosis (ICD10-I70.0). Electronically Signed  By: Van Clines M.D.   On: 11/15/2018 08:58   Dg Chest Port 1 View  Result Date: 11/14/2018 CLINICAL DATA:  Sepsis EXAM: PORTABLE CHEST 1 VIEW COMPARISON:  July 05, 2016 FINDINGS: The heart size and mediastinal contours is unchanged. There is patchy airspace opacity/blunting seen at the left lung base. Pulmonary vascular congestion is seen. The visualized skeletal structures are unremarkable. IMPRESSION: Pulmonary vascular congestion. Patchy airspace opacity with blunting seen at the left costophrenic angle which could represent small  effusion, atelectasis, and/or early infectious etiology. Electronically Signed   By: Prudencio Pair M.D.   On: 11/14/2018 19:05   Ct Image Guided Drainage By Percutaneous Catheter  Result Date: 11/16/2018 INDICATION: Post cholecystectomy, now with indeterminate fluid collection with the gallbladder fossa. Please perform image guided aspiration and/or drainage catheter placement. EXAM: CT IMAGE GUIDED DRAINAGE BY PERCUTANEOUS CATHETER COMPARISON:  CT abdomen and pelvis-11/15/2018; MRCP-11/15/2018 MEDICATIONS: The patient is currently admitted to the hospital and receiving intravenous antibiotics. The antibiotics were administered within an appropriate time frame prior to the initiation of the procedure. ANESTHESIA/SEDATION: Moderate (conscious) sedation was employed during this procedure. A total of Versed 1 mg and Fentanyl 25 mcg was administered intravenously. Moderate Sedation Time: 14 minutes. The patient's level of consciousness and vital signs were monitored continuously by radiology nursing throughout the procedure under my direct supervision. CONTRAST:  None COMPLICATIONS: None immediate. PROCEDURE: Informed written consent was obtained from the patient after a discussion of the risks, benefits and alternatives to treatment. The patient was placed supine on the CT gantry and a pre procedural CT was performed re-demonstrating the known abscess/fluid collection within the gallbladder fossa with dominant ill-defined component measuring approximately 4.0 x 2.1 cm (image 44, series 2). The procedure was planned. A timeout was performed prior to the initiation of the procedure. The skin overlying the right upper abdominal quadrant was prepped and draped in the usual sterile fashion. The overlying soft tissues were anesthetized with 1% lidocaine with epinephrine. Under direct ultrasound guidance, gallbladder fossa fluid collection was accessed with a 18 gauge trocar needle. A short Amplatz wire was coiled within  the collection and appropriate positioning was confirmed with CT imaging. Next, appropriate the tract was serially dilated allowing placement of a 10 [place] all-purpose drainage catheter. Appropriate positioning was confirmed with a limited postprocedural CT scan. Approximately 5 cc of purulent fluid was aspirated. The tube was connected to a JP bulb and sutured in place. A dressing was placed. The patient tolerated the procedure well without immediate post procedural complication. IMPRESSION: Successful CT guided placement of a 10 French all purpose drain catheter into the gallbladder fossa fluid collection with aspiration of 5 mL of purulent fluid. Samples were sent to the laboratory as requested by the ordering clinical team. Electronically Signed   By: Sandi Mariscal M.D.   On: 11/16/2018 09:32       Subjective: Feeling well, appetite good.  No vomiting, no confusion, no fever.   Discharge Exam: Vitals:   11/20/18 1230 11/20/18 1606  BP: 123/61   Pulse: 75   Resp: 18   Temp: 98.7 F (37.1 C) 97.8 F (36.6 C)  SpO2: 97%    Vitals:   11/20/18 1100 11/20/18 1105 11/20/18 1230 11/20/18 1606  BP: 123/64 (!) 143/70 123/61   Pulse: 71 70 75   Resp:  18 18   Temp:  98.4 F (36.9 C) 98.7 F (37.1 C) 97.8 F (36.6 C)  TempSrc:  Oral Oral Oral  SpO2:  100% 97%   Weight:  81.4 kg      General: Pt is alert, awake, not in acute distress Cardiovascular: RRR, nl S1-S2, no murmurs appreciated.   No LE edema.   Respiratory: Normal respiratory rate and rhythm.  CTAB without rales or wheezes. Abdominal: Abdomen soft and with moderate RUQ pain.  No distension or HSM.   Neuro/Psych: Strength symmetric in upper and lower extremities.  Judgment and insight appear normal.   The results of significant diagnostics from this hospitalization (including imaging, microbiology, ancillary and laboratory) are listed below for reference.     Microbiology: Recent Results (from the past 240 hour(s))   Culture, blood (Routine x 2)     Status: Abnormal   Collection Time: 11/14/18  5:03 PM   Specimen: BLOOD  Result Value Ref Range Status   Specimen Description BLOOD RIGHT ANTECUBITAL  Final   Special Requests   Final    BOTTLES DRAWN AEROBIC AND ANAEROBIC Blood Culture results may not be optimal due to an inadequate volume of blood received in culture bottles   Culture  Setup Time   Final    ANAEROBIC BOTTLE ONLY GRAM NEGATIVE RODS GRAM POSITIVE COCCI AEROBIC BOTTLE ONLY GRAM NEGATIVE RODS GRAM POSITIVE COCCI CRITICAL RESULT CALLED TO, READ BACK BY AND VERIFIED WITH: G ABBOTT PHARMD 11/15/18 0601 JDW    Culture (A)  Final    ESCHERICHIA COLI KLEBSIELLA PNEUMONIAE ENTEROCOCCUS FAECALIS SUSCEPTIBILITIES PERFORMED ON PREVIOUS CULTURE WITHIN THE LAST 5 DAYS. Performed at [facility], [place] [address], [place], [place] [address]    Report Status 11/19/2018 FINAL  Final  Blood Culture ID Panel (Reflexed)     Status: Abnormal   Collection Time: 11/14/18  5:03 PM  Result Value Ref Range Status   Enterococcus species DETECTED (A) NOT DETECTED Final    Comment: CRITICAL RESULT CALLED TO, READ BACK BY AND VERIFIED WITH: G ABBOTT PHARMD 11/15/18 0601 JDW    Vancomycin resistance NOT DETECTED NOT DETECTED Final   Listeria monocytogenes NOT DETECTED NOT DETECTED Final   Staphylococcus species NOT DETECTED NOT DETECTED Final   Staphylococcus aureus (BCID) NOT DETECTED NOT DETECTED Final   Streptococcus species NOT DETECTED NOT DETECTED Final   Streptococcus agalactiae NOT DETECTED NOT DETECTED Final   Streptococcus pneumoniae NOT DETECTED NOT DETECTED Final   Streptococcus pyogenes NOT DETECTED NOT DETECTED Final   Acinetobacter baumannii NOT DETECTED NOT DETECTED Final   Enterobacteriaceae species DETECTED (A) NOT DETECTED Final    Comment: CRITICAL RESULT CALLED TO, READ BACK BY AND VERIFIED WITH: G ABBOTT PHARMD 11/15/18 0601 JDW    Enterobacter cloacae complex NOT DETECTED NOT  DETECTED Final   Escherichia coli DETECTED (A) NOT DETECTED Final    Comment: CRITICAL RESULT CALLED TO, READ BACK BY AND VERIFIED WITH: G ABBOTT PHARMD 11/15/18 0601 JDW    Klebsiella oxytoca NOT DETECTED NOT DETECTED Final   Klebsiella pneumoniae DETECTED (A) NOT DETECTED Final    Comment: CRITICAL RESULT CALLED TO, READ BACK BY AND VERIFIED WITH: G ABBOTT PHARMD 11/15/18 0601 JDW    Proteus species NOT DETECTED NOT DETECTED Final   Serratia marcescens NOT DETECTED NOT DETECTED Final   Carbapenem resistance NOT DETECTED NOT DETECTED Final   Haemophilus influenzae NOT DETECTED NOT DETECTED Final   Neisseria meningitidis NOT DETECTED NOT DETECTED Final   Pseudomonas aeruginosa NOT DETECTED NOT DETECTED Final   Candida albicans NOT DETECTED NOT DETECTED Final   Candida glabrata NOT DETECTED NOT DETECTED Final   Candida  krusei NOT DETECTED NOT DETECTED Final   Candida parapsilosis NOT DETECTED NOT DETECTED Final   Candida tropicalis NOT DETECTED NOT DETECTED Final    Comment: Performed at [facility], [place] [address], [place], [place] [address]  Culture, blood (Routine x 2)     Status: Abnormal   Collection Time: 11/14/18  6:40 PM   Specimen: BLOOD RIGHT FOREARM  Result Value Ref Range Status   Specimen Description BLOOD RIGHT FOREARM  Final   Special Requests   Final    BOTTLES DRAWN AEROBIC AND ANAEROBIC Blood Culture results may not be optimal due to an inadequate volume of blood received in culture bottles   Culture  Setup Time   Final    IN BOTH AEROBIC AND ANAEROBIC BOTTLES GRAM POSITIVE COCCI GRAM NEGATIVE RODS CRITICAL VALUE NOTED.  VALUE IS CONSISTENT WITH PREVIOUSLY REPORTED AND CALLED VALUE. Performed at [facility], [place] [address], [place], [place] [address]    Culture (A)  Final    ESCHERICHIA COLI KLEBSIELLA PNEUMONIAE ENTEROCOCCUS FAECALIS    Report Status 11/19/2018 FINAL  Final   Organism ID, Bacteria ESCHERICHIA COLI  Final   Organism ID, Bacteria  KLEBSIELLA PNEUMONIAE  Final   Organism ID, Bacteria ENTEROCOCCUS FAECALIS  Final      Susceptibility   Escherichia coli - MIC*    AMPICILLIN <=2 SENSITIVE Sensitive     CEFAZOLIN <=4 SENSITIVE Sensitive     CEFEPIME <=1 SENSITIVE Sensitive     CEFTAZIDIME <=1 SENSITIVE Sensitive     CEFTRIAXONE <=1 SENSITIVE Sensitive     CIPROFLOXACIN <=0.25 SENSITIVE Sensitive     GENTAMICIN <=1 SENSITIVE Sensitive     IMIPENEM <=0.25 SENSITIVE Sensitive     TRIMETH/SULFA <=20 SENSITIVE Sensitive     AMPICILLIN/SULBACTAM <=2 SENSITIVE Sensitive     PIP/TAZO <=4 SENSITIVE Sensitive     Extended ESBL NEGATIVE Sensitive     * ESCHERICHIA COLI   Enterococcus faecalis - MIC*    AMPICILLIN <=2 SENSITIVE Sensitive     VANCOMYCIN 2 SENSITIVE Sensitive     GENTAMICIN SYNERGY SENSITIVE Sensitive     * ENTEROCOCCUS FAECALIS   Klebsiella pneumoniae - MIC*    AMPICILLIN >=32 RESISTANT Resistant     CEFAZOLIN <=4 SENSITIVE Sensitive     CEFEPIME <=1 SENSITIVE Sensitive     CEFTAZIDIME <=1 SENSITIVE Sensitive     CEFTRIAXONE <=1 SENSITIVE Sensitive     CIPROFLOXACIN <=0.25 SENSITIVE Sensitive     GENTAMICIN <=1 SENSITIVE Sensitive     IMIPENEM <=0.25 SENSITIVE Sensitive     TRIMETH/SULFA <=20 SENSITIVE Sensitive     AMPICILLIN/SULBACTAM 8 SENSITIVE Sensitive     PIP/TAZO <=4 SENSITIVE Sensitive     Extended ESBL NEGATIVE Sensitive     * KLEBSIELLA PNEUMONIAE  SARS CORONAVIRUS 2 (TAT 6-24 HRS) Nasopharyngeal Nasopharyngeal Swab     Status: None   Collection Time: 11/14/18 10:33 PM   Specimen: Nasopharyngeal Swab  Result Value Ref Range Status   SARS Coronavirus 2 NEGATIVE NEGATIVE Final    Comment: (NOTE) SARS-CoV-2 target nucleic acids are NOT DETECTED. The SARS-CoV-2 RNA is generally detectable in upper and lower respiratory specimens during the acute phase of infection. Negative results do not preclude SARS-CoV-2 infection, do not rule out co-infections with other pathogens, and should not be used  as the sole basis for treatment or other patient management decisions. Negative results must be combined with clinical observations, patient history, and epidemiological information. The expected result is Negative. Fact Sheet  for Patients: SugarRoll.be Fact Sheet for Healthcare Providers: https://www.woods-mathews.com/ This test is not yet approved or cleared by the [place] FDA and  has been authorized for detection and/or diagnosis of SARS-CoV-2 by FDA under an Emergency Use Authorization (EUA). This EUA will remain  in effect (meaning this test can be used) for the duration of the COVID-19 declaration under Section 56 4(b)(1) of the Act, 21 U.S.C. section 360bbb-3(b)(1), unless the authorization is terminated or revoked sooner. Performed at [facility], [place] [address], [place], [place] [address]   MRSA PCR Screening     Status: None   Collection Time: 11/15/18  3:57 PM   Specimen: Nasopharyngeal  Result Value Ref Range Status   MRSA by PCR NEGATIVE NEGATIVE Final    Comment:        The GeneXpert MRSA Assay (FDA approved for NASAL specimens only), is one component of a comprehensive MRSA colonization surveillance program. It is not intended to diagnose MRSA infection nor to guide or monitor treatment for MRSA infections. Performed at [facility], [place] [address], [place], [place] [address]   Culture, blood (routine x 2)     Status: None   Collection Time: 11/15/18  5:30 PM   Specimen: BLOOD  Result Value Ref Range Status   Specimen Description BLOOD RIGHT ANTECUBITAL  Final   Special Requests   Final    BOTTLES DRAWN AEROBIC ONLY Blood Culture adequate volume   Culture   Final    NO GROWTH 5 DAYS Performed at [facility], [place] [address], [place], [place] [address]    Report Status 11/20/2018 FINAL  Final  Culture, blood (routine x 2)     Status: None   Collection Time: 11/15/18  5:47 PM   Specimen:  BLOOD RIGHT HAND  Result Value Ref Range Status   Specimen Description BLOOD RIGHT HAND  Final   Special Requests   Final    BOTTLES DRAWN AEROBIC ONLY Blood Culture adequate volume   Culture   Final    NO GROWTH 5 DAYS Performed at [facility], [place] [address], [place], [place] [address]    Report Status 11/20/2018 FINAL  Final  Aerobic/Anaerobic Culture (surgical/deep wound)     Status: Abnormal (Preliminary result)   Collection Time: 11/16/18  9:18 AM   Specimen: Abscess  Result Value Ref Range Status   Specimen Description ABSCESS GALL BLADDER  Final   Special Requests Normal  Final   Gram Stain   Final    FEW WBC PRESENT,BOTH PMN AND MONONUCLEAR RARE GRAM POSITIVE COCCI IN PAIRS RARE GRAM VARIABLE ROD Performed at [facility], [place] [address], [place], [place] [address]    Culture (A)  Final    MULTIPLE ORGANISMS PRESENT, NONE PREDOMINANT NO ANAEROBES ISOLATED; CULTURE IN PROGRESS FOR 5 DAYS    Report Status PENDING  Incomplete  Urine culture     Status: None   Collection Time: 11/18/18  5:44 AM   Specimen: In/Out Cath Urine  Result Value Ref Range Status   Specimen Description IN/OUT CATH URINE  Final   Special Requests NONE  Final   Culture   Final    NO GROWTH Performed at [facility], [place] [address], [place], [place] [address]    Report Status 11/19/2018 FINAL  Final     Labs: BNP (last 3 results) No results for input(s): BNP in the last 8760 hours. Basic Metabolic Panel: Recent Labs  Lab 11/16/18 0245 11/17/18 0247 11/18/18 0241 11/19/18 0311 11/20/18 0320  NA 135 136 135 137 135  K 3.9 3.7 3.3* 3.0* 3.3*  CL 97* 96* 95* 98 97*  CO2 25 25 24 24 25   GLUCOSE 109* 168* 166* 134* 216*  BUN 30* 21* 41* 51* 46*  CREATININE 4.58* 3.76* 4.96* 5.56* 5.79*  CALCIUM 8.5* 8.4* 8.5* 8.3* 7.7*  PHOS  --   --   --  3.8  --    Liver Function Tests: Recent Labs  Lab 11/16/18 0245 11/17/18 0247 11/18/18 0241 11/19/18 0311 11/20/18 0320   AST 81* 45* 28 42* 91*  ALT 131* 99* 73* 67* 91*  ALKPHOS 746* 815* 863* 798* 787*  BILITOT 6.5* 6.6* 6.3* 4.8* 4.1*  PROT 5.9* 6.5 6.4* 6.0* 5.5*  ALBUMIN 2.3* 2.3* 2.3* 2.1* 2.1*   No results for input(s): LIPASE, AMYLASE in the last 168 hours. Recent Labs  Lab 11/15/18 0242  AMMONIA 38*   CBC: Recent Labs  Lab 11/14/18 1702  11/16/18 0245 11/17/18 0247 11/18/18 0241 11/19/18 0311 11/20/18 0320  WBC 7.7   < > 8.1 7.2 7.3 5.7 7.9  NEUTROABS 7.4  --   --   --   --   --   --   HGB 10.6*   < > 8.9* 9.6* 10.4* 9.7* 9.7*  HCT 34.2*   < > 28.1* 29.8* 31.7* 29.5* 29.6*  MCV 92.9   < > 92.4 90.0 88.3 87.5 88.4  PLT 190   < > 107* 128* 175 201 220   < > = values in this interval not displayed.   Cardiac Enzymes: No results for input(s): CKTOTAL, CKMB, CKMBINDEX, TROPONINI in the last 168 hours. BNP: Invalid input(s): POCBNP CBG: Recent Labs  Lab 11/19/18 1159 11/19/18 1716 11/19/18 2152 11/20/18 1233 11/20/18 1604  GLUCAP 125* 94 252* 134* 179*   D-Dimer No results for input(s): DDIMER in the last 72 hours. Hgb A1c No results for input(s): HGBA1C in the last 72 hours. Lipid Profile No results for input(s): CHOL, HDL, LDLCALC, TRIG, CHOLHDL, LDLDIRECT in the last 72 hours. Thyroid function studies No results for input(s): TSH, T4TOTAL, T3FREE, THYROIDAB in the last 72 hours.  Invalid input(s): FREET3 Anemia work up No results for input(s): VITAMINB12, FOLATE, FERRITIN, TIBC, IRON, RETICCTPCT in the last 72 hours. Urinalysis    Component Value Date/Time   COLORURINE AMBER (A) 11/18/2018 0807   APPEARANCEUR CLEAR 11/18/2018 0807   LABSPEC 1.015 11/18/2018 0807   PHURINE 6.0 11/18/2018 0807   GLUCOSEU >=500 (A) 11/18/2018 0807   HGBUR NEGATIVE 11/18/2018 0807   BILIRUBINUR SMALL (A) 11/18/2018 0807   KETONESUR 5 (A) 11/18/2018 0807   PROTEINUR >=300 (A) 11/18/2018 0807   NITRITE NEGATIVE 11/18/2018 0807   LEUKOCYTESUR NEGATIVE 11/18/2018 0807   Sepsis  Labs Invalid input(s): PROCALCITONIN,  WBC,  LACTICIDVEN Microbiology Recent Results (from the past 240 hour(s))  Culture, blood (Routine x 2)     Status: Abnormal   Collection Time: 11/14/18  5:03 PM   Specimen: BLOOD  Result Value Ref Range Status   Specimen Description BLOOD RIGHT ANTECUBITAL  Final   Special Requests   Final    BOTTLES DRAWN AEROBIC AND ANAEROBIC Blood Culture results may not be optimal due to an inadequate volume of blood received in culture bottles   Culture  Setup Time   Final    ANAEROBIC BOTTLE ONLY GRAM NEGATIVE RODS GRAM POSITIVE COCCI AEROBIC BOTTLE ONLY GRAM NEGATIVE RODS GRAM POSITIVE  COCCI CRITICAL RESULT CALLED TO, READ BACK BY AND VERIFIED WITH: G ABBOTT PHARMD 11/15/18 0601 JDW    Culture (A)  Final    ESCHERICHIA COLI KLEBSIELLA PNEUMONIAE ENTEROCOCCUS FAECALIS SUSCEPTIBILITIES PERFORMED ON PREVIOUS CULTURE WITHIN THE LAST 5 DAYS. Performed at [facility], [place] [address], [place], [place] [address]    Report Status 11/19/2018 FINAL  Final  Blood Culture ID Panel (Reflexed)     Status: Abnormal   Collection Time: 11/14/18  5:03 PM  Result Value Ref Range Status   Enterococcus species DETECTED (A) NOT DETECTED Final    Comment: CRITICAL RESULT CALLED TO, READ BACK BY AND VERIFIED WITH: G ABBOTT PHARMD 11/15/18 0601 JDW    Vancomycin resistance NOT DETECTED NOT DETECTED Final   Listeria monocytogenes NOT DETECTED NOT DETECTED Final   Staphylococcus species NOT DETECTED NOT DETECTED Final   Staphylococcus aureus (BCID) NOT DETECTED NOT DETECTED Final   Streptococcus species NOT DETECTED NOT DETECTED Final   Streptococcus agalactiae NOT DETECTED NOT DETECTED Final   Streptococcus pneumoniae NOT DETECTED NOT DETECTED Final   Streptococcus pyogenes NOT DETECTED NOT DETECTED Final   Acinetobacter baumannii NOT DETECTED NOT DETECTED Final   Enterobacteriaceae species DETECTED (A) NOT DETECTED Final    Comment: CRITICAL RESULT CALLED TO, READ  BACK BY AND VERIFIED WITH: G ABBOTT PHARMD 11/15/18 0601 JDW    Enterobacter cloacae complex NOT DETECTED NOT DETECTED Final   Escherichia coli DETECTED (A) NOT DETECTED Final    Comment: CRITICAL RESULT CALLED TO, READ BACK BY AND VERIFIED WITH: G ABBOTT PHARMD 11/15/18 0601 JDW    Klebsiella oxytoca NOT DETECTED NOT DETECTED Final   Klebsiella pneumoniae DETECTED (A) NOT DETECTED Final    Comment: CRITICAL RESULT CALLED TO, READ BACK BY AND VERIFIED WITH: G ABBOTT PHARMD 11/15/18 0601 JDW    Proteus species NOT DETECTED NOT DETECTED Final   Serratia marcescens NOT DETECTED NOT DETECTED Final   Carbapenem resistance NOT DETECTED NOT DETECTED Final   Haemophilus influenzae NOT DETECTED NOT DETECTED Final   Neisseria meningitidis NOT DETECTED NOT DETECTED Final   Pseudomonas aeruginosa NOT DETECTED NOT DETECTED Final   Candida albicans NOT DETECTED NOT DETECTED Final   Candida glabrata NOT DETECTED NOT DETECTED Final   Candida krusei NOT DETECTED NOT DETECTED Final   Candida parapsilosis NOT DETECTED NOT DETECTED Final   Candida tropicalis NOT DETECTED NOT DETECTED Final    Comment: Performed at [facility], [address], [place], [place] [address]  Culture, blood (Routine x 2)     Status: Abnormal   Collection Time: 11/14/18  6:40 PM   Specimen: BLOOD RIGHT FOREARM  Result Value Ref Range Status   Specimen Description BLOOD RIGHT FOREARM  Final   Special Requests   Final    BOTTLES DRAWN AEROBIC AND ANAEROBIC Blood Culture results may not be optimal due to an inadequate volume of blood received in culture bottles   Culture  Setup Time   Final    IN BOTH AEROBIC AND ANAEROBIC BOTTLES GRAM POSITIVE COCCI GRAM NEGATIVE RODS CRITICAL VALUE NOTED.  VALUE IS CONSISTENT WITH PREVIOUSLY REPORTED AND CALLED VALUE. Performed at [facility], [place] [address] [place], [place] [address]    Culture (A)  Final    ESCHERICHIA COLI KLEBSIELLA PNEUMONIAE ENTEROCOCCUS FAECALIS     Report Status 11/19/2018 FINAL  Final   Organism ID, Bacteria ESCHERICHIA COLI  Final   Organism ID, Bacteria KLEBSIELLA PNEUMONIAE  Final   Organism  ID, Bacteria ENTEROCOCCUS FAECALIS  Final      Susceptibility   Escherichia coli - MIC*    AMPICILLIN <=2 SENSITIVE Sensitive     CEFAZOLIN <=4 SENSITIVE Sensitive     CEFEPIME <=1 SENSITIVE Sensitive     CEFTAZIDIME <=1 SENSITIVE Sensitive     CEFTRIAXONE <=1 SENSITIVE Sensitive     CIPROFLOXACIN <=0.25 SENSITIVE Sensitive     GENTAMICIN <=1 SENSITIVE Sensitive     IMIPENEM <=0.25 SENSITIVE Sensitive     TRIMETH/SULFA <=20 SENSITIVE Sensitive     AMPICILLIN/SULBACTAM <=2 SENSITIVE Sensitive     PIP/TAZO <=4 SENSITIVE Sensitive     Extended ESBL NEGATIVE Sensitive     * ESCHERICHIA COLI   Enterococcus faecalis - MIC*    AMPICILLIN <=2 SENSITIVE Sensitive     VANCOMYCIN 2 SENSITIVE Sensitive     GENTAMICIN SYNERGY SENSITIVE Sensitive     * ENTEROCOCCUS FAECALIS   Klebsiella pneumoniae - MIC*    AMPICILLIN >=32 RESISTANT Resistant     CEFAZOLIN <=4 SENSITIVE Sensitive     CEFEPIME <=1 SENSITIVE Sensitive     CEFTAZIDIME <=1 SENSITIVE Sensitive     CEFTRIAXONE <=1 SENSITIVE Sensitive     CIPROFLOXACIN <=0.25 SENSITIVE Sensitive     GENTAMICIN <=1 SENSITIVE Sensitive     IMIPENEM <=0.25 SENSITIVE Sensitive     TRIMETH/SULFA <=20 SENSITIVE Sensitive     AMPICILLIN/SULBACTAM 8 SENSITIVE Sensitive     PIP/TAZO <=4 SENSITIVE Sensitive     Extended ESBL NEGATIVE Sensitive     * KLEBSIELLA PNEUMONIAE  SARS CORONAVIRUS 2 (TAT 6-24 HRS) Nasopharyngeal Nasopharyngeal Swab     Status: None   Collection Time: 11/14/18 10:33 PM   Specimen: Nasopharyngeal Swab  Result Value Ref Range Status   SARS Coronavirus 2 NEGATIVE NEGATIVE Final    Comment: (NOTE) SARS-CoV-2 target nucleic acids are NOT DETECTED. The SARS-CoV-2 RNA is generally detectable in upper and lower respiratory specimens during the acute phase of infection.  Negative results do not preclude SARS-CoV-2 infection, do not rule out co-infections with other pathogens, and should not be used as the sole basis for treatment or other patient management decisions. Negative results must be combined with clinical observations, patient history, and epidemiological information. The expected result is Negative. Fact Sheet for Patients: SugarRoll.be Fact Sheet for Healthcare Providers: https://www.woods-mathews.com/ This test is not yet approved or cleared by the [place] FDA and  has been authorized for detection and/or diagnosis of SARS-CoV-2 by FDA under an Emergency Use Authorization (EUA). This EUA will remain  in effect (meaning this test can be used) for the duration of the COVID-19 declaration under Section 56 4(b)(1) of the Act, 21 U.S.C. section 360bbb-3(b)(1), unless the authorization is terminated or revoked sooner. Performed at [facility], [place] [address], [place], [place] [address]   MRSA PCR Screening     Status: None   Collection Time: 11/15/18  3:57 PM   Specimen: Nasopharyngeal  Result Value Ref Range Status   MRSA by PCR NEGATIVE NEGATIVE Final    Comment:        The GeneXpert MRSA Assay (FDA approved for NASAL specimens only), is one component of a comprehensive MRSA colonization surveillance program. It is not intended to diagnose MRSA infection nor to guide or monitor treatment for MRSA infections. Performed at [facility], [place] [address] [place], [place] [address]   Culture, blood (routine x 2)     Status: None   Collection Time: 11/15/18  5:30 PM  Specimen: BLOOD  Result Value Ref Range Status   Specimen Description BLOOD RIGHT ANTECUBITAL  Final   Special Requests   Final    BOTTLES DRAWN AEROBIC ONLY Blood Culture adequate volume   Culture   Final    NO GROWTH 5 DAYS Performed at [facility], [address], [place], [place] [address]     Report Status 11/20/2018 FINAL  Final  Culture, blood (routine x 2)     Status: None   Collection Time: 11/15/18  5:47 PM   Specimen: BLOOD RIGHT HAND  Result Value Ref Range Status   Specimen Description BLOOD RIGHT HAND  Final   Special Requests   Final    BOTTLES DRAWN AEROBIC ONLY Blood Culture adequate volume   Culture   Final    NO GROWTH 5 DAYS Performed at [facility], [place] [address], [place], [place] [address]    Report Status 11/20/2018 FINAL  Final  Aerobic/Anaerobic Culture (surgical/deep wound)     Status: Abnormal (Preliminary result)   Collection Time: 11/16/18  9:18 AM   Specimen: Abscess  Result Value Ref Range Status   Specimen Description ABSCESS GALL BLADDER  Final   Special Requests Normal  Final   Gram Stain   Final    FEW WBC PRESENT,BOTH PMN AND MONONUCLEAR RARE GRAM POSITIVE COCCI IN PAIRS RARE GRAM VARIABLE ROD Performed at [facility], [place] [address] [place], [place] [address]    Culture (A)  Final    MULTIPLE ORGANISMS PRESENT, NONE PREDOMINANT NO ANAEROBES ISOLATED; CULTURE IN PROGRESS FOR 5 DAYS    Report Status PENDING  Incomplete  Urine culture     Status: None   Collection Time: 11/18/18  5:44 AM   Specimen: In/Out Cath Urine  Result Value Ref Range Status   Specimen Description IN/OUT CATH URINE  Final   Special Requests NONE  Final   Culture   Final    NO GROWTH Performed at [facility], [place] [address], [place], [place] [address]    Report Status 11/19/2018 FINAL  Final     Time coordinating discharge: 45 minutes     SIGNED:   Edwin Dada, MD  [facility] 11/20/2018, 8:08 PM

## 2018-11-21 ENCOUNTER — Other Ambulatory Visit: Payer: Self-pay | Admitting: General Surgery

## 2018-11-21 DIAGNOSIS — R188 Other ascites: Secondary | ICD-10-CM

## 2018-11-21 LAB — AEROBIC/ANAEROBIC CULTURE W GRAM STAIN (SURGICAL/DEEP WOUND): Special Requests: NORMAL

## 2018-11-28 ENCOUNTER — Other Ambulatory Visit: Payer: Medicare Other

## 2018-11-29 ENCOUNTER — Ambulatory Visit
Admission: RE | Admit: 2018-11-29 | Discharge: 2018-11-29 | Disposition: A | Discharge status: Home / Self Care | Payer: Medicare Other | Source: Ambulatory Visit | Attending: General Surgery | Admitting: General Surgery

## 2018-11-29 ENCOUNTER — Encounter: Payer: Self-pay | Admitting: Radiology

## 2018-11-29 ENCOUNTER — Ambulatory Visit
Admission: RE | Admit: 2018-11-29 | Discharge: 2018-11-29 | Disposition: A | Discharge status: Home / Self Care | Payer: Medicare Other | Source: Ambulatory Visit | Attending: Radiology | Admitting: Radiology

## 2018-11-29 DIAGNOSIS — R188 Other ascites: Secondary | ICD-10-CM

## 2018-11-29 HISTORY — PX: IR RADIOLOGIST EVAL & MGMT: IMG5224

## 2018-11-29 MED ORDER — IOPAMIDOL (ISOVUE-300) INJECTION 61%
100.0000 mL | Freq: Once | INTRAVENOUS | Status: AC | PRN
  Administered 2018-11-29: 100 mL via INTRAVENOUS

## 2018-11-29 NOTE — Progress Notes (Signed)
Patient ID: Edgar Brewer, male   DOB: 1960/08/15, 58 y.o.   MRN: [id number]        Chief Complaint: Drainage catheter evaluation and management   Referring Physician(s): Kinsinger  History of Present Illness: Edgar Brewer is a 58 y.o. male with complex past medical history significant for acute on chronic combined systolic and diastolic heart failure, chronic kidney disease, pulmonary arterial hypertension and diabetes, post cholecystectomy complicated by development of indeterminate fluid collection of the gallbladder fossa for which the patient underwent CT-guided drainage catheter placement on 11/16/2018.  The Patient presents today to the interventional radiology drain Clinic for drainage catheter evaluation and management.  Patient has completed his prescribed course of antibiotics.  The patient reports little to no output from the percutaneous drainage catheter for the past several days.  He is not flushing the percutaneous drainage catheter.  Patient denies worsening abdominal pain, fever or chills.  Past Medical History:  Diagnosis Date   Acute on chronic combined systolic and diastolic CHF, NYHA class 1 ([place]) 01/13/2015   Arthritis    Chronic kidney disease    MWF [name].   Diabetes mellitus type 2 with complications ([place])    Previous insulin, non-insulin requiring noted 09/2018.  Complications include retinopathy, peripheral neuropathy.   GERD (gastroesophageal reflux disease)    at times   Head injury with loss of consciousness ([place])    History of kidney stones    Hypertension    Neuropathy    Pulmonary hypertension ([place])    PA peak pressure 41 mmHg 03/04/16 echo   Varicose veins of lower extremities with complications 07/24/1599    Past Surgical History:  Procedure Laterality Date   AV FISTULA PLACEMENT Left 06/01/2016   Procedure: ARTERIOVENOUS (AV) FISTULA CREATION;  Surgeon: [name], MD;  Location: [facility];  Service: Vascular;  Laterality: Left;     AV FISTULA PLACEMENT Left 08/10/2017   Procedure: BRACHIOCEPHALIC ARTERIOVENOUS (AV) FISTULA CREATION LEFT UPPER EXTREMITY;  Surgeon: [name], MD;  Location: [facility];  Service: Vascular;  Laterality: Left;   CHOLECYSTECTOMY N/A 10/04/2018   Procedure: LAPAROSCOPIC CHOLECYSTECTOMY;  Surgeon: Mickeal Skinner, MD;  Location: [facility];  Service: General;  Laterality: N/A;   COLONOSCOPY W/ POLYPECTOMY  03/2016   For evaluation of iron deficiency anemia.  Dr. Silverio Decamp.  10 polyps, largest 20 mm.  Pathology: tubular adenomas with no high-grade dysplasia, hyperplastic.  None bleeding internal hemorrhoids.  Pandiverticulosis.   ENDOVENOUS ABLATION SAPHENOUS VEIN W/ LASER Left 07-23-2015   endovenous laser ablation left greater saphenous vein by [name]   ENDOVENOUS ABLATION SAPHENOUS VEIN W/ LASER Right 10/15/2015   EVLA right greater saphenous vein by [name]   ESOPHAGOGASTRODUODENOSCOPY  03/2016   For evaluation of IDA.  Dr. Silverio Decamp.  Duodenal erythema, pathology benign mucosa, no villous atrophy.   IR RADIOLOGIST EVAL & MGMT  11/29/2018   TEE WITHOUT CARDIOVERSION N/A 11/19/2018   Procedure: TRANSESOPHAGEAL ECHOCARDIOGRAM (TEE);  Surgeon: Acie Fredrickson Wonda Cheng, MD;  Location[facility] ENDOSCOPY;  Service: Cardiovascular;  Laterality: N/A;    Allergies: Codeine  Medications: Prior to Admission medications   Medication Sig Start Date End Date Taking? Authorizing Provider  b complex-vitamin c-folic acid (NEPHRO-VITE) 0.8 MG TABS tablet Take 1 tablet by mouth daily.    [provider]  carvedilol (COREG) 6.25 MG tablet Take 1 tablet (6.25 mg total) by mouth 2 (two) times daily. 11/20/18 12/20/18  Edwin Dada, MD  cinacalcet (SENSIPAR) 60 MG tablet  Take 60 mg by mouth daily.    [provider]  doxercalciferol (HECTOROL) 4 MCG/2ML injection Inject 5 mLs (10 mcg total) into the vein every Monday, Wednesday, and Friday with hemodialysis. 10/08/18    Brewer, Ava, DO  gabapentin (NEURONTIN) 300 MG capsule Take 300 mg by mouth See admin instructions. Take 300 mg by mouth in the morning and 300 mg at bedtime    [provider]  ibuprofen (ADVIL) 200 MG tablet Take 200-800 mg by mouth every 8 (eight) hours as needed for headache or mild pain.    [provider]  linagliptin (TRADJENTA) 5 MG TABS tablet Take 5 mg by mouth daily.    [provider]  linezolid (ZYVOX) 600 MG tablet Take 1 tablet (600 mg total) by mouth 2 (two) times daily. 11/20/18   Danford, Suann Larry, MD  omeprazole (PRILOSEC) 20 MG capsule Take 20 mg by mouth daily before breakfast.  11/25/17   [provider]  ondansetron (ZOFRAN) 4 MG tablet Take 1 tablet (4 mg total) by mouth every 6 (six) hours as needed for nausea. Patient taking differently: Take 4 mg by mouth every 6 (six) hours as needed for nausea or vomiting.  10/22/18   Aline August, MD  sevelamer carbonate (RENVELA) 800 MG tablet Take 2 tablets (1,600 mg total) by mouth 3 (three) times daily with meals. 10/22/18   Aline August, MD     Family History  Problem Relation Age of Onset   Cancer Father     Social History   Socioeconomic History   Marital status: Legally Separated    Spouse name: Not on file   Number of children: Not on file   Years of education: Not on file   Highest education level: Not on file  Occupational History   Not on file  Social Needs   Financial resource strain: Not on file   Food insecurity    Worry: Never true    Inability: Never true   Transportation needs    Medical: No    Non-medical: No  Tobacco Use   Smoking status: Former Smoker    Packs/day: 1.00    Years: 45.00    Pack years: 45.00    Types: Cigarettes    Quit date: 01/13/2015    Years since quitting: 3.8   Smokeless tobacco: Never Used  Substance and Sexual Activity   Alcohol use: Yes    Alcohol/week: 1.0 standard drinks    Types: 1 Glasses of wine per week      Comment: rare occassion   Drug use: No   Sexual activity: Not on file  Lifestyle   Physical activity    Days per week: Not on file    Minutes per session: Not on file   Stress: Not on file  Relationships   Social connections    Talks on phone: Not on file    Gets together: Not on file    Attends religious service: Not on file    Active member of club or organization: Not on file    Attends meetings of clubs or organizations: Not on file    Relationship status: Not on file  Other Topics Concern   Not on file  Social History Narrative   Not on file    ECOG Status: 1 - Symptomatic but completely ambulatory  Review of Systems: A 12 point ROS discussed and pertinent positives are indicated in the HPI above.  All other systems are negative.  Review of  Systems  Constitutional: Negative for activity change, chills and fever.  Gastrointestinal: Negative for abdominal pain.    Vital Signs: BP 136/67    Pulse 73    Temp 99.3 F (37.4 C) (Oral)    SpO2 99%   Physical Exam Abdominal:       Comments: Location of the percutaneous drainage catheter.      Imaging:  CT abdomen and pelvis-11/29/2018-unchanged positioning of percutaneous drainage catheter with resolution of gallbladder fossa fluid collection.  No new definable/drainable fluid collections within the abdomen or pelvis.  Fluoroscopic guided drainage catheter injection-11/29/2018- drainage catheter injection demonstrates opacification of decompressed abscess cavity without evidence of communication between the decompressed abscess cavity and adjacent biliary tree or cystic duct remnant  Ct Chest Wo Contrast  Result Date: 11/15/2018 CLINICAL DATA:  Shortness of breath, fever EXAM: CT CHEST WITHOUT CONTRAST TECHNIQUE: Multidetector CT imaging of the chest was performed following the standard protocol without IV contrast. COMPARISON:  None. FINDINGS: Cardiovascular: There are no significant vascular findings.  There is a minimal pericardial effusion seen. Coronary artery calcifications are noted. Scattered aortic atherosclerotic calcifications are seen without aneurysmal dilatation. Mediastinum/Nodes: There are no enlarged mediastinal, hilar or axillary lymph nodes. The thyroid gland, trachea and esophagus demonstrate no significant findings. Lungs/Pleura: Patchy/streaky area of airspace opacities are seen at the posterior bilateral lower lungs. There are small amount of air bronchogram seen within this region, left greater than right. No pneumothorax or pleural effusion. Upper abdomen: Partially visualized low-density collection with foci of air within the gallbladder bed. A small hiatal hernia is present. Musculoskeletal/Chest wall: There is no chest wall mass or suspicious osseous finding. No acute osseous abnormality IMPRESSION: 1. Mild patchy airspace disease in the posterior lower lungs, left greater than right. This likely represents atelectasis, less likely atypical infectious etiology. 2. Trace pericardial effusion 3.  Aortic Atherosclerosis (ICD10-I70.0). Electronically Signed   By: Prudencio Pair M.D.   On: 11/15/2018 01:07   Ct Abdomen Pelvis W Contrast  Result Date: 11/29/2018 CLINICAL DATA:  History of cholecystectomy post CT-guided placement of a percutaneous catheter into indeterminate fluid collection within the gallbladder fossa on 11/16/2018. Patient presents today to the interventional radiology drain Clinic for drainage catheter evaluation and management. Patient has completed his prescribed course of antibiotics. Patient reports little to no output from the percutaneous catheter for the past several days. He is not flushing the percutaneous catheter. EXAM: CT ABDOMEN AND PELVIS WITH CONTRAST TECHNIQUE: Multidetector CT imaging of the abdomen and pelvis was performed using the standard protocol following bolus administration of intravenous contrast. CONTRAST:  144mL ISOVUE-300 IOPAMIDOL (ISOVUE-300)  INJECTION 61% COMPARISON:  CT abdomen and pelvis-11/14/2018; 10/18/2018; CT-guided percutaneous catheter placement-11/16/2018 FINDINGS: Lower chest: Limited visualization of the lower thorax demonstrates minimal dependent subsegmental atelectasis within the imaged bilateral lower lobes improved compared to the 11/14/2018 examination. No new focal airspace opacities. No pleural effusion. Normal heart size.  No pericardial effusion. Hepatobiliary: Normal hepatic contour. There is mild diffuse decreased attenuation hepatic parenchyma on this postcontrast examination suggestive of hepatic steatosis. There is a minimal amount of accentuated focal fatty infiltration adjacent to the fissure for ligamentum teres. No discrete hepatic lesions. Previously noted mild intra and extrahepatic biliary duct dilatation is resolved in the interval. Post cholecystectomy. There has been complete resolution of previously noted indeterminate fluid collection within the gallbladder fossa following percutaneous drainage catheter placement. No new definable/drainable fluid collections within the perihepatic space. No ascites. Pancreas: Normal appearance of the pancreas. Spleen: Borderline splenomegaly with  the spleen measuring 15 cm in diameter (coronal image 103, series 6). Adrenals/Urinary Tract: There is symmetric enhancement of the bilateral kidneys. No definite renal stones on this postcontrast examination. No discrete renal lesions. There is a minimal to moderate amount of grossly symmetric bilateral perinephric stranding. No urinary obstruction. Normal appearance of the bilateral adrenal glands. Normal appearance of the urinary bladder given degree of distention. Stomach/Bowel: Rather extensive colonic diverticulosis involving the majority of the descending and sigmoid colon though several diverticuli are also seen at the level of the hepatic flexure. No evidence superimposed acute diverticulitis. Large colonic stool burden without  evidence of enteric obstruction. Normal appearance of the terminal ileum and retrocecal appendix. No discrete areas of bowel wall thickening. No pneumoperitoneum, pneumatosis or portal venous gas. Vascular/Lymphatic: Scattered moderate amount of mixed calcified and noncalcified atherosclerotic plaque throughout a normal caliber abdominal aorta, not resulting in hemodynamically significant stenosis. The major branch vessels of the abdominal aorta appear patent on this non CTA examination. Scattered porta hepatis and retroperitoneal lymph nodes are numerous though individually not enlarged by size criteria with index porta hepatis lymph node measuring 0.7 cm in greatest short axis diameter (26, series 2), index left-sided preaortic lymph node measuring 0.8 cm in greatest short axis diameter (image 52, series 2) and index aortocaval lymph nodes measuring approximately 0.6 cm (image 38, series 2) in 0.7 cm (image 45, series 2), respectively, presumably reactive due to patient body habitus. No bulky retroperitoneal, mesenteric, pelvic or inguinal lymphadenopathy. Reproductive: Normal appearance the prostate gland. No free fluid the pelvic cul-de-sac. Other: Note is made of an approximately 2.5 x 1.7 cm hypoattenuating (16 Hounsfield unit) lesion within the subcutaneous tissues about the anterior superior aspect of the left thigh (image 78, series 2), nonspecific though favored to represent a sebaceous cyst. Linear ill-defined stranding within the ventral right upper abdomen likely represents the location of previous laparotomy port from recent cholecystectomy. Musculoskeletal: No acute or aggressive osseous abnormalities. Stigmata of DISH within the thoracic spine. IMPRESSION: 1. Interval resolution of indeterminate fluid collection within the gallbladder fossa following percutaneous drainage catheter placement. No new definable/drainable fluid collections within the abdomen or pelvis. 2. Interval resolution of  previously noted mild intra and extrahepatic biliary ductal dilatation. 3. Improved aeration of the imaged lung bases with persistent minimal subsegmental atelectasis. 4. Extensive colonic diverticulosis without evidence of superimposed acute diverticulitis. 5. Large colonic stool burden without evidence of enteric obstruction. 6. Aortic Atherosclerosis (ICD10-I70.0). PLAN: Patient subsequently underwent percutaneous drainage catheter injection Electronically Signed   By: Sandi Mariscal M.D.   On: 11/29/2018 09:43   Ct Abdomen Pelvis W Contrast  Addendum Date: 11/14/2018   ADDENDUM REPORT: 11/14/2018 23:28 ADDENDUM: The mild biliary ductal dilatation reported in the findings, appears to be new since the prior exam. There is also a new mild common bile duct dilatation measuring up to 0.2 cm with mild surrounding fat stranding changes. This could be due to choledocholithiasis. Electronically Signed   By: Prudencio Pair M.D.   On: 11/14/2018 23:28   Result Date: 11/14/2018 CLINICAL DATA:  Nausea vomiting abdominal pain, fever EXAM: CT ABDOMEN AND PELVIS WITH CONTRAST TECHNIQUE: Multidetector CT imaging of the abdomen and pelvis was performed using the standard protocol following bolus administration of intravenous contrast. CONTRAST:  180mL OMNIPAQUE IOHEXOL 300 MG/ML  SOLN COMPARISON:  October 18, 2018 FINDINGS: Lower chest: The visualized heart size within normal limits. No pericardial fluid/thickening. No hiatal hernia. There is patchy/streaky airspace opacity at both lung bases, slightly  increased from the prior exam. Hepatobiliary: Within the gallbladder bed there is a 4.1 x 2.8 x 2.7 cm low-density collection now slightly increased in size from the prior exam with a small foci of air. There is mild intrahepatic biliary ductal dilatation. There does appear to be some fat stranding changes seen within the gallbladder bed. No other focal lesions seen.The main portal vein is patent. The patient is status post  cholecystectomy. Pancreas: Unremarkable. No pancreatic ductal dilatation or surrounding inflammatory changes. Spleen: Normal in size without focal abnormality. Adrenals/Urinary Tract: Both adrenal glands appear normal. The kidneys and collecting system appear normal without evidence of urinary tract calculus or hydronephrosis. Bladder is unremarkable. Again noted are bilateral perinephric stranding. Stomach/Bowel: The stomach, small bowel, and colon are normal in appearance. No inflammatory changes, wall thickening, or obstructive findings.The appendix is normal. Vascular/Lymphatic: There are no enlarged mesenteric, retroperitoneal, or pelvic lymph nodes. Scattered aortic atherosclerotic calcifications are seen without aneurysmal dilatation. Reproductive: The prostate is unremarkable. Other: No evidence of abdominal wall mass or hernia. Musculoskeletal: No acute or significant osseous findings. IMPRESSION: 1. Interval slight increase in the fluid collection within the gallbladder bed now with a small foci of air, measuring 4.1 x 2.8 x 2.7 cm. Given the patient's history this could represent a infected collection within the liver/gallbladder bed. 2. Slight interval increase in the patchy/streaky airspace opacities at both lung bases which could represent atelectasis and/or atypical infectious etiology. Electronically Signed: By: Prudencio Pair M.D. On: 11/14/2018 20:10   Mr Abdomen Mrcp Wo Contrast  Result Date: 11/15/2018 CLINICAL DATA:  Jaundice, abdominal pain. Elevated liver function tests, nausea, and vomiting. Laparoscopic cholecystectomy on 10/04/2018. Mild biliary dilatation on CT of 11/14/2018, query choledocholithiasis. EXAM: MRI ABDOMEN WITHOUT CONTRAST  (INCLUDING MRCP) TECHNIQUE: Multiplanar multisequence MR imaging of the abdomen was performed. Heavily T2-weighted images of the biliary and pancreatic ducts were obtained, and three-dimensional MRCP images were rendered by post processing. COMPARISON:   11/14/2018 FINDINGS: Lower chest: Mild airspace opacities in the posterior basal segments of both lower lobes. Mild cardiomegaly. Hepatobiliary: Periportal edema. Small amount of gas and fluid in the gallbladder fossa at the site of cholecystectomy. This gas is abnormal 12 days out from surgery. Mild reduction of hepatic parenchymal signal on inphase images compared to out of phase images, along with reduced T2 signal in the spleen, suspicious for hemosiderosis. Currently there is no biliary dilatation. Pancreas:  Pancreas divisum. No findings of acute pancreatitis. Spleen: Less than expected T2 signal and less than expected in phase signal in the spleen, suspicious for hemosiderosis. Adrenals/Urinary Tract: 4 mm fluid signal intensity lesion of the left mid kidney on image 29/4, likely a cyst but too small to characterize. The adrenal glands appear normal. No hydronephrosis. Minimal perirenal stranding bilaterally similar to recent CT appearance. Stomach/Bowel: Diverticulum of the transverse duodenum. Numerous scattered colonic diverticula. Vascular/Lymphatic: Aortoiliac atherosclerotic vascular disease. No pathologic adenopathy identified. Other:  No supplemental non-categorized findings. Musculoskeletal: There is evidence of lumbar degenerative disc disease. IMPRESSION: 1. Small amount of gas and fluid in the gallbladder fossa at the site of cholecystectomy. This gas is abnormal 12 days out from surgery, and the possibility of a small abscess along the gallbladder fossa is raised. 2. No current biliary dilatation. There is nonspecific periportal edema. 3. 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 4. Pancreas divisum. 5. Mild airspace opacities in the posterior basal segments of both lower lobes. 6. Mild cardiomegaly. 7. Colonic diverticulosis. 8.  Aortic Atherosclerosis (  ICD10-I70.0). Electronically Signed   By: Van Clines M.D.   On: 11/15/2018 08:58   Dg Chest Port 1  View  Result Date: 11/14/2018 CLINICAL DATA:  Sepsis EXAM: PORTABLE CHEST 1 VIEW COMPARISON:  July 05, 2016 FINDINGS: The heart size and mediastinal contours is unchanged. There is patchy airspace opacity/blunting seen at the left lung base. Pulmonary vascular congestion is seen. The visualized skeletal structures are unremarkable. IMPRESSION: Pulmonary vascular congestion. Patchy airspace opacity with blunting seen at the left costophrenic angle which could represent small effusion, atelectasis, and/or early infectious etiology. Electronically Signed   By: Prudencio Pair M.D.   On: 11/14/2018 19:05   Ct Image Guided Drainage By Percutaneous Catheter  Result Date: 11/16/2018 INDICATION: Post cholecystectomy, now with indeterminate fluid collection with the gallbladder fossa. Please perform image guided aspiration and/or drainage catheter placement. EXAM: CT IMAGE GUIDED DRAINAGE BY PERCUTANEOUS CATHETER COMPARISON:  CT abdomen and pelvis-11/15/2018; MRCP-11/15/2018 MEDICATIONS: The patient is currently admitted to the hospital and receiving intravenous antibiotics. The antibiotics were administered within an appropriate time frame prior to the initiation of the procedure. ANESTHESIA/SEDATION: Moderate (conscious) sedation was employed during this procedure. A total of Versed 1 mg and Fentanyl 25 mcg was administered intravenously. Moderate Sedation Time: 14 minutes. The patient's level of consciousness and vital signs were monitored continuously by radiology nursing throughout the procedure under my direct supervision. CONTRAST:  None COMPLICATIONS: None immediate. PROCEDURE: Informed written consent was obtained from the patient after a discussion of the risks, benefits and alternatives to treatment. The patient was placed supine on the CT gantry and a pre procedural CT was performed re-demonstrating the known abscess/fluid collection within the gallbladder fossa with dominant ill-defined component measuring  approximately 4.0 x 2.1 cm (image 44, series 2). The procedure was planned. A timeout was performed prior to the initiation of the procedure. The skin overlying the right upper abdominal quadrant was prepped and draped in the usual sterile fashion. The overlying soft tissues were anesthetized with 1% lidocaine with epinephrine. Under direct ultrasound guidance, gallbladder fossa fluid collection was accessed with a 18 gauge trocar needle. A short Amplatz wire was coiled within the collection and appropriate positioning was confirmed with CT imaging. Next, appropriate the tract was serially dilated allowing placement of a 10 [place] all-purpose drainage catheter. Appropriate positioning was confirmed with a limited postprocedural CT scan. Approximately 5 cc of purulent fluid was aspirated. The tube was connected to a JP bulb and sutured in place. A dressing was placed. The patient tolerated the procedure well without immediate post procedural complication. IMPRESSION: Successful CT guided placement of a 10 French all purpose drain catheter into the gallbladder fossa fluid collection with aspiration of 5 mL of purulent fluid. Samples were sent to the laboratory as requested by the ordering clinical team. Electronically Signed   By: Sandi Mariscal M.D.   On: 11/16/2018 09:32   Ir Radiologist Eval & Mgmt  Result Date: 11/29/2018 Please refer to notes tab for details about interventional procedure. (Op Note)   Labs:  CBC: Recent Labs    11/17/18 0247 11/18/18 0241 11/19/18 0311 11/20/18 0320  WBC 7.2 7.3 5.7 7.9  HGB 9.6* 10.4* 9.7* 9.7*  HCT 29.8* 31.7* 29.5* 29.6*  PLT 128* 175 201 220    COAGS: Recent Labs    10/19/18 0321 10/22/18 0426 11/01/18 0913 11/14/18 1702 11/14/18 1837  INR 1.2 1.0 1.0 1.0  --   APTT  --   --   --   --  41*    BMP: Recent Labs    11/17/18 0247 11/18/18 0241 11/19/18 0311 11/20/18 0320  NA 136 135 137 135  K 3.7 3.3* 3.0* 3.3*  CL 96* 95* 98 97*  CO2 25  24 24 25   GLUCOSE 168* 166* 134* 216*  BUN 21* 41* 51* 46*  CALCIUM 8.4* 8.5* 8.3* 7.7*  CREATININE 3.76* 4.96* 5.56* 5.79*  GFRNONAA 17* 12* 10* 10*  GFRAA 19* 14* 12* 11*    LIVER FUNCTION TESTS: Recent Labs    11/17/18 0247 11/18/18 0241 11/19/18 0311 11/20/18 0320  BILITOT 6.6* 6.3* 4.8* 4.1*  AST 45* 28 42* 91*  ALT 99* 73* 67* 91*  ALKPHOS 815* 863* 798* 787*  PROT 6.5 6.4* 6.0* 5.5*  ALBUMIN 2.3* 2.3* 2.1* 2.1*    TUMOR MARKERS: No results for input(s): AFPTM, CEA, CA199, CHROMGRNA in the last 8760 hours.  Assessment and Plan:  Edgar Brewer is a 58 y.o. male with complex past medical history significant for acute on chronic combined systolic and diastolic heart failure, chronic kidney disease, pulmonary arterial hypertension and diabetes, post cholecystectomy performed on 10/04/2018 by Dr. Kieth Brightly complicated by development of indeterminate fluid collection of the gallbladder fossa for which the patient underwent CT-guided drainage catheter placement on 11/16/2018.  CT scan of the abdomen pelvis performed earlier today demonstrates complete resolution of the gallbladder fossa fluid collection following drainage catheter placement.  Subsequent drainage catheter injection is negative for evidence of fistulous connection between the decompressed abscess cavity and adjacent biliary tree or cystic duct remnant  Given above, as well as lack of any significant output from the drainage catheter for the past several days, the decision was made to remove the percutaneous catheter.  This was done at the patient's bedside without incident.  Patient was encouraged to maintain all subsequent follow-up appointments with referring surgeon, Dr. Kieth Brightly, and may otherwise follow-up at the interventional radiology drain clinic on a PRN basis.   Thank you for this interesting consult.  I greatly enjoyed meeting Edgar Brewer and look forward to participating in their care.  A copy of  this report was sent to the requesting provider on this date.  Electronically Signed: Sandi Mariscal 11/29/2018, 10:26 AM   I spent a total of 10 Minutes in face to face in clinical consultation, greater than 50% of which was counseling/coordinating care for percutaneous drainage catheter evaluation and management

## 2018-12-04 ENCOUNTER — Ambulatory Visit (INDEPENDENT_AMBULATORY_CARE_PROVIDER_SITE_OTHER): Payer: Medicare Other | Admitting: Nurse Practitioner

## 2018-12-04 ENCOUNTER — Other Ambulatory Visit (INDEPENDENT_AMBULATORY_CARE_PROVIDER_SITE_OTHER): Payer: Medicare Other

## 2018-12-04 VITALS — BP 119/60 | HR 73 | Temp 98.0°F | Ht 68.0 in | Wt 183.2 lb

## 2018-12-04 DIAGNOSIS — R7989 Other specified abnormal findings of blood chemistry: Secondary | ICD-10-CM | POA: Diagnosis not present

## 2018-12-04 DIAGNOSIS — K8309 Other cholangitis: Secondary | ICD-10-CM | POA: Diagnosis not present

## 2018-12-04 LAB — HEPATIC FUNCTION PANEL
ALT: 52 U/L (ref 0–53)
AST: 44 U/L — ABNORMAL HIGH (ref 0–37)
Albumin: 4 g/dL (ref 3.5–5.2)
Alkaline Phosphatase: 779 U/L — ABNORMAL HIGH (ref 39–117)
Bilirubin, Direct: 0.8 mg/dL — ABNORMAL HIGH (ref 0.0–0.3)
Total Bilirubin: 1.8 mg/dL — ABNORMAL HIGH (ref 0.2–1.2)
Total Protein: 7.2 g/dL (ref 6.0–8.3)

## 2018-12-04 LAB — BASIC METABOLIC PANEL
BUN: 29 mg/dL — ABNORMAL HIGH (ref 6–23)
CO2: 30 mEq/L (ref 19–32)
Calcium: 8.7 mg/dL (ref 8.4–10.5)
Chloride: 91 mEq/L — ABNORMAL LOW (ref 96–112)
Creatinine, Ser: 3.98 mg/dL — ABNORMAL HIGH (ref 0.40–1.50)
GFR: 15.51 mL/min — ABNORMAL LOW (ref >60.00)
Glucose, Bld: 268 mg/dL — ABNORMAL HIGH (ref 70–99)
Potassium: 4.1 mEq/L (ref 3.5–5.1)
Sodium: 132 mEq/L — ABNORMAL LOW (ref 135–145)

## 2018-12-04 LAB — CBC WITH DIFFERENTIAL/PLATELET
Basophils Absolute: 0.1 10*3/uL (ref 0.0–0.1)
Basophils Relative: 0.7 % (ref 0.0–3.0)
Eosinophils Absolute: 0.2 10*3/uL (ref 0.0–0.7)
Eosinophils Relative: 2.6 % (ref 0.0–5.0)
HCT: 32.1 % — ABNORMAL LOW (ref 39.0–52.0)
Hemoglobin: 10.9 g/dL — ABNORMAL LOW (ref 13.0–17.0)
Lymphocytes Relative: 14.2 % (ref 12.0–46.0)
Lymphs Abs: 1.2 10*3/uL (ref 0.7–4.0)
MCHC: 33.9 g/dL (ref 30.0–36.0)
MCV: 89 fl (ref 78.0–100.0)
Monocytes Absolute: 0.4 10*3/uL (ref 0.1–1.0)
Monocytes Relative: 4.8 % (ref 3.0–12.0)
Neutro Abs: 6.3 10*3/uL (ref 1.4–7.7)
Neutrophils Relative %: 77.7 % — ABNORMAL HIGH (ref 43.0–77.0)
Platelets: 215 10*3/uL (ref 150.0–400.0)
RBC: 3.61 Mil/uL — ABNORMAL LOW (ref 4.22–5.81)
RDW: 19.4 % — ABNORMAL HIGH (ref 11.5–15.5)
WBC: 8.2 10*3/uL (ref 4.0–10.5)

## 2018-12-04 NOTE — Patient Instructions (Signed)
Your provider has requested that you go to the basement level for lab work before leaving today. Press "B" on the elevator. The lab is located at the first door on the left as you exit the elevator.   If you are age 59 or younger, your body mass index should be between 19-25. Your Body mass index is 27.86 kg/m. If this is out of the aformentioned range listed, please consider follow up with your Primary Care Provider.   Follow- up in 3 months. Our office will contact you to schedule at a later time.   It was a pleasure to see you today!  Carl Best NP

## 2018-12-04 NOTE — Progress Notes (Signed)
12/04/2018 Edgar Brewer [id number] [birth year]      12/04/2018 Edgar Brewer [id number] [birth year]   HISTORY OF PRESENT ILLNESS: Edgar Brewer is a58 y.o.M with ESRD on HD on MWF, DM, HTN, diastolic and dystolic CHF, pulmonary HTN, hepatic steatosis and elevated LFTs, jaundice with cholelithiasis s/p laparoscopic cholecystectomy on 10/04/18 by Dr. Kieth Brightly. IOC was not done. He presented to [facility] 10/18/2018 with nausea, vomiting and mild abdominal pain. Temp 101. An abd/pelvic CT showed a 3 x 2 cm fluid pocket along the inferior surface of the liver in the cholecystectomy area. Alp phos 1404, AST 499, ALT 579 and T. Bili 5.4. Normal WBC. He received Zosyn IV. HIDA did not show any evidence of a bile leak. ERCP was not warranted. Hep B surf ag negative, Hep B core IgM and total ab negative, Hep A IgM negative, Hep C ab negative, CMV IgM < 30. EBV IgM < 36. EBV IgG 157. AMA < 20. SMA 5. Iron 55. Ferritin 1823. He was discharged home on 9/28.   He was seen in office by Dr. Silverio Decamp on 11/01/2018 with complaints of generalized fatigue, nauseas, 1 episode of vomiting and weight loss. Alk phos 1072 and T. Bili 3.7 concerning for cholangiopathy. AST 58. ALT 106. Alk phos isoenzymes: bone 22, intestinal 1 and liver 77.  The plan was to follow his LFTs every 4 weeks and to follow up in the office in 3 months.  He presented to [facility] ED 11/14/2018 with encephalopathy, fever, jaundice, tachycardia, vomiting and RUQ pain.  He was diagnosed with acute hypoxic respiratory failure and sepsis secondary from cholangitis.  T. Bili 7. Alk phos 1114. AST 169. ALT 187. WBC 7.7. CT abdomen/pelvis with contrast was obtained which showed a slight interval increase in the fluid collection within the gallbladder bed now with a small foci of air, measuring 4.1 x 2.8 x 2.7 cm. A MRCP  showed a small amount of gas and fluid in the gallbladder fossa without biliary dilatation and signal findings in the liver and spleen suspicious for  hemosiderosis. He underwent a gallbladder fossa percutaneous aspiration and drainage by Dr. Reesa Chew. Blood cultures were positive for Klebsiella, E. coli and Enterococcus bacteremia. ID was consulted. He received  Unasyn IV, and Linezolid for 14 days.  He underwent a TTE that  negative for vegetation. He was discharged home 11/20/2018 with the JP drain in place.  Labs at time of discharge 11/20/2018: Na 135. K 3.3. BUN 46. Cr. 5.79. Alk phos 787. AST 91. ALT 91. T. Bili 4.1. WBC 7.9. HG 9.7. HCT 88.4. PLT 220.  Weight 86-88.2 Kg.  He presents today for GI follow up. He presents today accompanied by his wife. He was seen by general surgery 1 or 2 weeks ago and his drain was removed. He has a follow up appointment with Dr. Kieth Brightly in 1 week. At that time, further abdominal imaging will be ordered by Dr. Kieth Brightly. He denies having any nausea or vomiting.  No fever or chills.  He is eating a solid diet without difficulty.  He is passing normal brown bowel movement once daily.  No rectal bleeding or melena. He remains on hemodialysis every MWF without distress. He provide limited information, his responses to my questions are brief. He has lost 10lbs since his hospital discharge. Weight today is 183.lbs (83Kg).   Abdominal MRI/MRCP WO contrast  10/04/2018: 1. Cholelithiasis and gallbladder wall thickening. Correlate clinically in assessing for acute cholecystitis. 2. No biliary dilatation or appreciable choledocholithiasis.  3. There is some dropout of signal in both the liver and spleen on inphase images, potentially from hemosiderosis or hemochromatosis. 4. Pancreas divisum. 5. Colonic diverticulosis. 6.  Aortic Atherosclerosis   Colonoscopy 04/05/2016: Preparation of the colon was unsatisfactory. - Preparation of the colon was fair. - Two 2 to 3 mm polyps at the appendiceal orifice, removed with a cold biopsy forceps. Resected and retrieved. - Three 7 to 11 mm polyps in the cecum, removed with a  hot snare. Resected and retrieved. - One 20 mm polyp in the ascending colon, removed piecemeal using a hot snare. Resected and retrieved. - Four 3 to 6 mm polyps at the recto-sigmoid colon, in the descending colon, in the transverse colon and in the ascending colon, removed with a cold snare. Resected and retrieved. - Diverticulosis in the sigmoid colon, in the descending colon, in the transverse colon and in the ascending colon. - Non-bleeding internal hemorrhoids. Repeat colonoscopy in 1 year was recommended.  EGD 04/05/2016: Normal esophagus. - Normal stomach. - Erythematous duodenopathy   Past Medical History:  Diagnosis Date  . Acute on chronic combined systolic and diastolic CHF, NYHA class 1 ([place]) 01/13/2015  . Arthritis   . Chronic kidney disease    MWF [name].  . Diabetes mellitus type 2 with complications ([place])    Previous insulin, non-insulin requiring noted 09/2018.  Complications include retinopathy, peripheral neuropathy.  Marland Kitchen GERD (gastroesophageal reflux disease)    at times  . Head injury with loss of consciousness ([place])   . History of kidney stones   . Hypertension   . Neuropathy   . Pulmonary hypertension (HCC)    PA peak pressure 41 mmHg 03/04/16 echo  . Varicose veins of lower extremities with complications 23/55/7322   Past Surgical History:  Procedure Laterality Date  . AV FISTULA PLACEMENT Left 06/01/2016   Procedure: ARTERIOVENOUS (AV) FISTULA CREATION;  Surgeon: [name], MD;  Location: [place];  Service: Vascular;  Laterality: Left;  . AV FISTULA PLACEMENT Left 08/10/2017   Procedure: BRACHIOCEPHALIC ARTERIOVENOUS (AV) FISTULA CREATION LEFT UPPER EXTREMITY;  Surgeon: [name], MD;  Location: [place];  Service: Vascular;  Laterality: Left;  . CHOLECYSTECTOMY N/A 10/04/2018   Procedure: LAPAROSCOPIC CHOLECYSTECTOMY;  Surgeon: Kinsinger, Arta Bruce, MD;  Location: [place];  Service: General;  Laterality: N/A;  . COLONOSCOPY W/ POLYPECTOMY   03/2016   For evaluation of iron deficiency anemia.  Dr. Silverio Decamp.  10 polyps, largest 20 mm.  Pathology: tubular adenomas with no high-grade dysplasia, hyperplastic.  None bleeding internal hemorrhoids.  Pandiverticulosis.  Marland Kitchen ENDOVENOUS ABLATION SAPHENOUS VEIN W/ LASER Left 07-23-2015   endovenous laser ablation left greater saphenous vein by [name]  . ENDOVENOUS ABLATION SAPHENOUS VEIN W/ LASER Right 10/15/2015   EVLA right greater saphenous vein by [name]  . ESOPHAGOGASTRODUODENOSCOPY  03/2016   For evaluation of IDA.  Dr. Silverio Decamp.  Duodenal erythema, pathology benign mucosa, no villous atrophy.  . IR RADIOLOGIST EVAL & MGMT  11/29/2018  . TEE WITHOUT CARDIOVERSION N/A 11/19/2018   Procedure: TRANSESOPHAGEAL ECHOCARDIOGRAM (TEE);  Surgeon: Acie Fredrickson Wonda Cheng, MD;  Location: [facility] ENDOSCOPY;  Service: Cardiovascular;  Laterality: N/A;    reports that he quit smoking about 3 years ago. His smoking use included cigarettes. He has a 45.00 pack-year smoking history. He has never used smokeless tobacco. He reports current alcohol use of about 1.0 standard drinks of alcohol per week. He reports that he does not use drugs. family history  includes Cancer in his father. Allergies  Allergen Reactions  . Codeine Anaphylaxis, Hives, Swelling and Other (See Comments)    Swelling all over body and the throat      Outpatient Encounter Medications as of 12/04/2018  Medication Sig  . b complex-vitamin c-folic acid (NEPHRO-VITE) 0.8 MG TABS tablet Take 1 tablet by mouth daily.  . carvedilol (COREG) 6.25 MG tablet Take 1 tablet (6.25 mg total) by mouth 2 (two) times daily.  . cinacalcet (SENSIPAR) 60 MG tablet Take 60 mg by mouth daily.  Marland Kitchen doxercalciferol (HECTOROL) 4 MCG/2ML injection Inject 5 mLs (10 mcg total) into the vein every Monday, Wednesday, and Friday with hemodialysis.  Marland Kitchen gabapentin (NEURONTIN) 300 MG capsule Take 300 mg by mouth See admin instructions. Take 300 mg by mouth in the  morning and 300 mg at bedtime  . ibuprofen (ADVIL) 200 MG tablet Take 200-800 mg by mouth every 8 (eight) hours as needed for headache or mild pain.  Marland Kitchen linagliptin (TRADJENTA) 5 MG TABS tablet Take 5 mg by mouth daily.  Marland Kitchen linezolid (ZYVOX) 600 MG tablet Take 1 tablet (600 mg total) by mouth 2 (two) times daily.  Marland Kitchen omeprazole (PRILOSEC) 20 MG capsule Take 20 mg by mouth daily before breakfast.   . ondansetron (ZOFRAN) 4 MG tablet Take 1 tablet (4 mg total) by mouth every 6 (six) hours as needed for nausea. (Patient taking differently: Take 4 mg by mouth every 6 (six) hours as needed for nausea or vomiting. )  . sevelamer carbonate (RENVELA) 800 MG tablet Take 2 tablets (1,600 mg total) by mouth 3 (three) times daily with meals.   No facility-administered encounter medications on file as of 12/04/2018.     REVIEW OF SYSTEMS  : All other systems reviewed and negative except where noted in the History of Present Illness.  PHYSICAL EXAM: BP 119/60   Pulse 73   Temp 98 F (36.7 C)   Ht _0  (1.727 m)   Wt 183 lb 3.2 oz (83.1 kg)   SpO2 98%   BMI 27.86 kg/m  General: Fatigued appearing 58 year old male in NAD. Head: Normocephalic and atraumatic Eyes: Trace sclera icterus Ears: Normal auditory acuity Neck: Supple, no masses.  Lungs: Clear throughout to auscultation Heart: Regular rate and rhythm Abdomen: Soft, nontender, non distended. No masses or hepatomegaly noted. Normal bowel sounds Rectal: Deferred.  Musculoskeletal: Symmetrical with no gross deformities  Skin: No lesions on visible extremities. No obvious jaundice Extremities: No edema. LUE AV graft + bruit and thrill Neurological: Alert oriented x 4, grossly nonfocal Cervical Nodes:  No significant cervical adenopathy Psychological:  Alert and cooperative. Normal mood and affect  ASSESSMENT AND PLAN:  35.  58 year old male s/p laparoscopic cholecystectomy on 10/04/18, [facility]/14/2018 with jaundice,  elevated LFTs, sepsis secondary to cholangitis.  CT abdomen/pelvis identified a slight increase in the fluid collection within the gallbladder s/p gallbladder fossa percutaneous aspiration and drainage. Blood cultures positive for Klebsiella, E. coli and Enterococcus bacteremia deceived IV antibiotics.   MRCP was negative for choledocholithiasis.  His alk phos and total bili levels have remained elevated. -CBC, CMP today -Follow-up with general surgeon Dr. Reece Agar as scheduled, repeat abdominal/pelvic CT to be ordered by Dr. Reece Agar -Further recommendations to be determined after the above lab results reviewed -Follow-up in the office in 3 months and as needed -Await repeat abdominal/pelvic CT results, if his alk phos, total bili and LFTs level remain elevated he may require an  eventual ERCP to further assess the biliary tract  2.  History of colon polyp, colonoscopy 03/2016 identified several tubular adenomatous and hyperplastic polyps, 1 4m polyp, inadequate bowel prep, repeat colonoscopy in 1 year was recommended -Repeat colonoscopy when he has completely recovered from his gallbladder surgery and gallbladder fossa I&D and when his LFTs and total bilirubin levels have normalized.  He further discuss at the time of his follow-up appointment in 3 months.  3.  Abdominal MRI/MRCP identified pancreatic divisum -Check lipase level if nausea and vomiting or upper abdominal pain recurs  4.  End-stage renal disease on hemodialysis  5.  Hepatic steatosis  6.  Pulmonary hypertension  7.  CHF  8.  GERD -Continue  Omeprazole 20 mg once daily  9.  Diabetes mellitus type 2    CC:  LVassie Moment MD

## 2018-12-05 ENCOUNTER — Encounter: Payer: Self-pay | Admitting: Nurse Practitioner

## 2018-12-07 NOTE — Progress Notes (Signed)
CARDIOLOGY OFFICE NOTE  Date:  12/13/2018    Edgar Brewer Date of Birth: 24-Apr-1960 Medical Record #[id number]  PCP:  Vassie Moment, MD  Cardiologist:  Andrez Grime chief complaint on file.   History of Present Illness: Edgar Brewer is a 58 y.o. male who presents today for f/u diastolic CHF and mild AS He has CRF with Cr now 7.4 Now on dialysis  Has left brachio cephalic fistula. Also with anemia, DM, neuropathy and LE Varicosities. History of anemia due to CRF EGD 04/05/16 duodenitis and colon with multiple polyps removed and Diverticular disease.   TTE Reviewed 11/07/17 EF 60-65% mild AS mean gradient 11 mmHg mild MR He had f/u TEE/TTE 10/24 and 10/26 when in hospital for bacteremia and commented On only AV sclerosis and not stenosis  He is a big Electrical engineer to talk sports and thinks Alvino Blood stinks   Had laparoscopic cholecystectomy on 10/04/18 readmitted with ? Seroma vs biloma or abscess started on antibiotics HIDA negative for bile leak but outpatient still with elevated alk phos and ESR 97 Seeing Dr Silverio Decamp  No cardiac complaints    Past Medical History:  Diagnosis Date  . Acute on chronic combined systolic and diastolic CHF, NYHA class 1 ([place]) 01/13/2015  . Arthritis   . Chronic kidney disease    MWF [name].  . Diabetes mellitus type 2 with complications ([place])    Previous insulin, non-insulin requiring noted 09/2018.  Complications include retinopathy, peripheral neuropathy.  Marland Kitchen GERD (gastroesophageal reflux disease)    at times  . Head injury with loss of consciousness ([place])   . History of kidney stones   . Hypertension   . Neuropathy   . Pulmonary hypertension (HCC)    PA peak pressure 41 mmHg 03/04/16 echo  . Varicose veins of lower extremities with complications 09/06/4816    Past Surgical History:  Procedure Laterality Date  . AV FISTULA PLACEMENT Left 06/01/2016   Procedure: ARTERIOVENOUS (AV) FISTULA CREATION;  Surgeon: [name], MD;   Location: [place];  Service: Vascular;  Laterality: Left;  . AV FISTULA PLACEMENT Left 08/10/2017   Procedure: BRACHIOCEPHALIC ARTERIOVENOUS (AV) FISTULA CREATION LEFT UPPER EXTREMITY;  Surgeon: [name], MD;  Location: [place];  Service: Vascular;  Laterality: Left;  . CHOLECYSTECTOMY N/A 10/04/2018   Procedure: LAPAROSCOPIC CHOLECYSTECTOMY;  Surgeon: Kinsinger, Arta Bruce, MD;  Location: [place];  Service: General;  Laterality: N/A;  . COLONOSCOPY W/ POLYPECTOMY  03/2016   For evaluation of iron deficiency anemia.  Dr. Silverio Decamp.  10 polyps, largest 20 mm.  Pathology: tubular adenomas with no high-grade dysplasia, hyperplastic.  None bleeding internal hemorrhoids.  Pandiverticulosis.  Marland Kitchen ENDOVENOUS ABLATION SAPHENOUS VEIN W/ LASER Left 07-23-2015   endovenous laser ablation left greater saphenous vein by [name]  . ENDOVENOUS ABLATION SAPHENOUS VEIN W/ LASER Right 10/15/2015   EVLA right greater saphenous vein by [name]  . ESOPHAGOGASTRODUODENOSCOPY  03/2016   For evaluation of IDA.  Dr. Silverio Decamp.  Duodenal erythema, pathology benign mucosa, no villous atrophy.  . IR RADIOLOGIST EVAL & MGMT  11/29/2018  . TEE WITHOUT CARDIOVERSION N/A 11/19/2018   Procedure: TRANSESOPHAGEAL ECHOCARDIOGRAM (TEE);  Surgeon: Acie Fredrickson Wonda Cheng, MD;  Location: [facility] ENDOSCOPY;  Service: Cardiovascular;  Laterality: N/A;     Medications: Current Outpatient Medications  Medication Sig Dispense Refill  . b complex-vitamin c-folic acid (NEPHRO-VITE) 0.8 MG TABS tablet Take 1 tablet by mouth daily.    . carvedilol (  COREG) 6.25 MG tablet Take 1 tablet (6.25 mg total) by mouth 2 (two) times daily. 60 tablet 0  . cinacalcet (SENSIPAR) 60 MG tablet Take 60 mg by mouth daily.    Marland Kitchen doxercalciferol (HECTOROL) 4 MCG/2ML injection Inject 5 mLs (10 mcg total) into the vein every Monday, Wednesday, and Friday with hemodialysis. 2 mL 0  . gabapentin (NEURONTIN) 300 MG capsule Take 300 mg by mouth See admin  instructions. Take 300 mg by mouth in the morning and 300 mg at bedtime    . ibuprofen (ADVIL) 200 MG tablet Take 200-800 mg by mouth every 8 (eight) hours as needed for headache or mild pain.    Marland Kitchen linagliptin (TRADJENTA) 5 MG TABS tablet Take 5 mg by mouth daily.    Marland Kitchen linezolid (ZYVOX) 600 MG tablet Take 1 tablet (600 mg total) by mouth 2 (two) times daily. 14 tablet 0  . omeprazole (PRILOSEC) 20 MG capsule Take 20 mg by mouth daily before breakfast.   2  . ondansetron (ZOFRAN) 4 MG tablet Take 1 tablet (4 mg total) by mouth every 6 (six) hours as needed for nausea. (Patient taking differently: Take 4 mg by mouth every 6 (six) hours as needed for nausea or vomiting. ) 20 tablet 0  . sevelamer carbonate (RENVELA) 800 MG tablet Take 2 tablets (1,600 mg total) by mouth 3 (three) times daily with meals.     No current facility-administered medications for this visit.     Allergies: Allergies  Allergen Reactions  . Codeine Anaphylaxis, Hives, Swelling and Other (See Comments)    Swelling all over body and the throat    Social History: The patient  reports that he quit smoking about 3 years ago. His smoking use included cigarettes. He has a 45.00 pack-year smoking history. He has never used smokeless tobacco. He reports current alcohol use of about 1.0 standard drinks of alcohol per week. He reports that he does not use drugs.   Family History: The patient's family history includes Cancer in his father.   Review of Systems: Please see the history of present illness.   Otherwise, the review of systems is positive for none.   All other systems are reviewed and negative.   Physical Exam: VS:  BP 120/70   Pulse 74   Ht 5' 8" (1.727 m)   Wt 185 lb (83.9 kg)   SpO2 100%   BMI 28.13 kg/m  .  BMI Body mass index is 28.13 kg/m.  Wt Readings from Last 3 Encounters:  12/13/18 185 lb (83.9 kg)  12/04/18 183 lb 3.2 oz (83.1 kg)  11/20/18 179 lb 7.3 oz (81.4 kg)    Affect appropriate  Healthy:  appears stated age HEENT: normal Neck supple with no adenopathy JVP normal no bruits no thyromegaly Lungs clear with no wheezing and good diaphragmatic motion Heart:  S1/S2 preserved mild AS murmur, no rub, gallop or click PMI normal Abdomen: benighn, BS positve, no tenderness, no AAA no bruit.  No HSM or HJR Left radial-cephalic fistula with bad thrill more proximal antecubital  brachio-cephalic fistula better  No edema Neuro non-focal Skin warm and dry No muscular weakness   LABORATORY DATA:  EKG:   07/06/16 NSR with nonspecific T wave changes.  Lab Results  Component Value Date   WBC 8.2 12/04/2018   HGB 10.9 (L) 12/04/2018   HCT 32.1 (L) 12/04/2018   PLT 215.0 12/04/2018   GLUCOSE 268 (H) 12/04/2018   ALT 52 12/04/2018   AST  44 (H) 12/04/2018   NA 132 (L) 12/04/2018   K 4.1 12/04/2018   CL 91 (L) 12/04/2018   CREATININE 3.98 (H) 12/04/2018   BUN 29 (H) 12/04/2018   CO2 30 12/04/2018   TSH 1.256 01/13/2015   INR 1.0 11/14/2018   HGBA1C 7.3 (H) 11/15/2018    BNP (last 3 results) No results for input(s): BNP in the last 8760 hours.  ProBNP (last 3 results) No results for input(s): PROBNP in the last 8760 hours.   Other Studies Reviewed Today:  Myoview Study Highlights from 05/2014    Myocardial perfusion is normal. The study is abormal. This is an intermediate risk study. Overall left ventricular systolic function was abnormal. LV cavity size is normal. The left ventricular ejection fraction is moderately decreased (42%). There is no prior study for comparison.   Notes Recorded by Josue Hector, MD on 06/25/2014 at 8:31 AM Increase cozaar to 100 mg decrease lasix to once/day F/u with me to discuss cath EF low   Echo Study Conclusions from 05/2014  - Left ventricle: The cavity size was mildly dilated. Wall   thickness was increased in a pattern of mild LVH. Systolic   function was mildly reduced. The estimated ejection fraction was   in the  range of 45% to 50%. Diffuse hypokinesis. Doppler   parameters are consistent with elevated ventricular end-diastolic   filling pressure. - Mitral valve: There was mild regurgitation. - Left atrium: The atrium was mildly dilated. - Atrial septum: No defect or patent foramen ovale was identified. - Pulmonary arteries: PA peak pressure: 52 mm Hg (S). - Pericardium, extracardiac: Small to moderate circumferential   pericardial effusion no tamponade.   Assessment/Plan: 1. Anemia:  Multiple polyps removed f/u GI and CKD f/u primary   2. Chronic diastolic HF -  Not clear that this is an issue suspect fluid from A/CRF continue lasix  3. Varicose veins/venous insufficiency - chronic. Refer back to Dr Early ? Sclerosing/ablative Rx  4. CKD -   Now on dialysis  AV fistula done 06/01/16 by Dr Early F/U Dr Lorrene Reid   5. DM - Discussed low carb diet.  Target hemoglobin A1c is 6.5 or less.  Continue current medications. A1c in September poor control 8.4   6. Prior tobacco abuse - tells me he is not smoking CXR 07/05/16 with airway thickening bronchitis will update   7. Aortic Stenosis :  Very mild considering fistula with mean gradient 11 mmHg TTE done 11/07/17 and no progression on recent TEE/TTE done October 2020   8. GI:  F/U Dr Silverio Decamp post cholecystectomy with rehospitalization for elevated alk phos  ESR up at [address]

## 2018-12-12 ENCOUNTER — Telehealth (INDEPENDENT_AMBULATORY_CARE_PROVIDER_SITE_OTHER): Payer: Self-pay | Admitting: Nurse Practitioner

## 2018-12-12 DIAGNOSIS — R7989 Other specified abnormal findings of blood chemistry: Secondary | ICD-10-CM

## 2018-12-12 DIAGNOSIS — R945 Abnormal results of liver function studies: Secondary | ICD-10-CM

## 2018-12-12 DIAGNOSIS — N186 End stage renal disease: Secondary | ICD-10-CM

## 2018-12-12 NOTE — Telephone Encounter (Signed)
Edgar Brewer, please enter a lab order for a CBC and CMP to be  Completed on Tuesday Dec 18, 2018.  Called the patient and spoke to his wife.  She is aware of these lab orders and she will make sure he goes to the lab on Tuesday.

## 2018-12-12 NOTE — Telephone Encounter (Signed)
Orders placed in EPIC per provider recommendations;

## 2018-12-13 ENCOUNTER — Other Ambulatory Visit: Payer: Self-pay

## 2018-12-13 ENCOUNTER — Encounter: Payer: Self-pay | Admitting: Cardiovascular Disease

## 2018-12-13 ENCOUNTER — Ambulatory Visit (INDEPENDENT_AMBULATORY_CARE_PROVIDER_SITE_OTHER): Payer: Medicare Other | Admitting: Cardiovascular Disease

## 2018-12-13 VITALS — BP 120/70 | HR 74 | Ht 68.0 in | Wt 185.0 lb

## 2018-12-13 DIAGNOSIS — I350 Nonrheumatic aortic (valve) stenosis: Secondary | ICD-10-CM

## 2018-12-13 NOTE — Patient Instructions (Addendum)
Your physician recommends that you continue on your current medications as directed. Please refer to the Current Medication list given to you today.   Your physician wants you to follow-up in:  6 MONTHS WITH DR NISHAN  You will receive a reminder letter in the mail two months in advance. If you don't receive a letter, please call our office to schedule the follow-up appointment. 

## 2018-12-19 ENCOUNTER — Other Ambulatory Visit: Payer: Medicare Other

## 2018-12-19 DIAGNOSIS — R7989 Other specified abnormal findings of blood chemistry: Secondary | ICD-10-CM

## 2018-12-19 DIAGNOSIS — R945 Abnormal results of liver function studies: Secondary | ICD-10-CM

## 2018-12-19 DIAGNOSIS — N186 End stage renal disease: Secondary | ICD-10-CM

## 2018-12-19 LAB — COMPREHENSIVE METABOLIC PANEL
AG Ratio: 1.4 (calc) (ref 1.0–2.5)
ALT: 53 U/L — ABNORMAL HIGH (ref 9–46)
AST: 38 U/L — ABNORMAL HIGH (ref 10–35)
Albumin: 4.2 g/dL (ref 3.6–5.1)
Alkaline phosphatase (APISO): 614 U/L — ABNORMAL HIGH (ref 35–144)
BUN/Creatinine Ratio: 6 (calc) (ref 6–22)
BUN: 26 mg/dL — ABNORMAL HIGH (ref 7–25)
CO2: 32 mmol/L (ref 20–32)
Calcium: 8.5 mg/dL — ABNORMAL LOW (ref 8.6–10.3)
Chloride: 91 mmol/L — ABNORMAL LOW (ref 98–110)
Creat: 4.06 mg/dL — ABNORMAL HIGH (ref 0.70–1.33)
Globulin: 2.9 g/dL (calc) (ref 1.9–3.7)
Glucose, Bld: 207 mg/dL — ABNORMAL HIGH (ref 65–99)
Potassium: 4.3 mmol/L (ref 3.5–5.3)
Sodium: 136 mmol/L (ref 135–146)
Total Bilirubin: 1.1 mg/dL (ref 0.2–1.2)
Total Protein: 7.1 g/dL (ref 6.1–8.1)

## 2018-12-19 LAB — CBC WITH DIFFERENTIAL/PLATELET
Absolute Monocytes: 577 cells/uL (ref 200–950)
Basophils Absolute: 73 cells/uL (ref 0–200)
Basophils Relative: 1 %
Eosinophils Absolute: 241 cells/uL (ref 15–500)
Eosinophils Relative: 3.3 %
HCT: 33.7 % — ABNORMAL LOW (ref 38.5–50.0)
Hemoglobin: 11.5 g/dL — ABNORMAL LOW (ref 13.2–17.1)
Lymphs Abs: 1453 cells/uL (ref 850–3900)
MCH: 30.9 pg (ref 27.0–33.0)
MCHC: 34.1 g/dL (ref 32.0–36.0)
MCV: 90.6 fL (ref 80.0–100.0)
MPV: 10.4 fL (ref 7.5–12.5)
Monocytes Relative: 7.9 %
Neutro Abs: 4957 cells/uL (ref 1500–7800)
Neutrophils Relative %: 67.9 %
Platelets: 264 10*3/uL (ref 140–400)
RBC: 3.72 10*6/uL — ABNORMAL LOW (ref 4.20–5.80)
RDW: 16 % — ABNORMAL HIGH (ref 11.0–15.0)
Total Lymphocyte: 19.9 %
WBC: 7.3 10*3/uL (ref 3.8–10.8)

## 2018-12-24 NOTE — Progress Notes (Signed)
Reviewed and agree with documentation and assessment and plan. K. Veena Matina Rodier , MD   

## 2018-12-25 ENCOUNTER — Other Ambulatory Visit: Payer: Self-pay

## 2018-12-25 DIAGNOSIS — R7989 Other specified abnormal findings of blood chemistry: Secondary | ICD-10-CM

## 2018-12-25 DIAGNOSIS — K8309 Other cholangitis: Secondary | ICD-10-CM

## 2018-12-25 DIAGNOSIS — R945 Abnormal results of liver function studies: Secondary | ICD-10-CM

## 2018-12-27 ENCOUNTER — Other Ambulatory Visit (INDEPENDENT_AMBULATORY_CARE_PROVIDER_SITE_OTHER): Payer: Medicare Other

## 2018-12-27 DIAGNOSIS — R945 Abnormal results of liver function studies: Secondary | ICD-10-CM | POA: Diagnosis not present

## 2018-12-27 DIAGNOSIS — K8309 Other cholangitis: Secondary | ICD-10-CM | POA: Diagnosis not present

## 2018-12-27 DIAGNOSIS — R7989 Other specified abnormal findings of blood chemistry: Secondary | ICD-10-CM

## 2018-12-27 LAB — GAMMA GT: GGT: 369 U/L — ABNORMAL HIGH (ref 7–51)

## 2018-12-27 NOTE — Addendum Note (Signed)
Addended by: Isaiah Serge D on: 12/27/2018 03:32 PM   Modules accepted: Orders

## 2018-12-31 LAB — ALKALINE PHOSPHATASE, ISOENZYMES
Alkaline Phosphatase: 633 IU/L — ABNORMAL HIGH (ref 39–117)
BONE FRACTION: 20 % (ref 12–68)
INTESTINAL FRAC.: 3 % (ref 0–18)
LIVER FRACTION: 77 % (ref 13–88)

## 2019-01-22 ENCOUNTER — Other Ambulatory Visit: Payer: Self-pay | Admitting: Nephrology

## 2019-01-22 ENCOUNTER — Other Ambulatory Visit (INDEPENDENT_AMBULATORY_CARE_PROVIDER_SITE_OTHER): Payer: Medicare Other

## 2019-01-22 DIAGNOSIS — R945 Abnormal results of liver function studies: Secondary | ICD-10-CM

## 2019-01-22 DIAGNOSIS — Z9049 Acquired absence of other specified parts of digestive tract: Secondary | ICD-10-CM

## 2019-01-22 DIAGNOSIS — R7989 Other specified abnormal findings of blood chemistry: Secondary | ICD-10-CM

## 2019-01-22 LAB — HEPATIC FUNCTION PANEL
ALT: 123 U/L — ABNORMAL HIGH (ref 0–53)
AST: 68 U/L — ABNORMAL HIGH (ref 0–37)
Albumin: 4.1 g/dL (ref 3.5–5.2)
Alkaline Phosphatase: 596 U/L — ABNORMAL HIGH (ref 39–117)
Bilirubin, Direct: 0.2 mg/dL (ref 0.0–0.3)
Total Bilirubin: 0.5 mg/dL (ref 0.2–1.2)
Total Protein: 7.2 g/dL (ref 6.0–8.3)

## 2019-01-31 ENCOUNTER — Other Ambulatory Visit: Payer: Medicare Other

## 2019-02-05 ENCOUNTER — Ambulatory Visit
Admission: RE | Admit: 2019-02-05 | Discharge: 2019-02-05 | Disposition: A | Discharge status: Home / Self Care | Payer: Medicare Other | Source: Ambulatory Visit | Attending: Nephrology | Admitting: Nephrology

## 2019-02-05 ENCOUNTER — Other Ambulatory Visit: Payer: Self-pay

## 2019-02-05 DIAGNOSIS — Z9049 Acquired absence of other specified parts of digestive tract: Secondary | ICD-10-CM

## 2019-02-05 MED ORDER — IOPAMIDOL (ISOVUE-300) INJECTION 61%
100.0000 mL | Freq: Once | INTRAVENOUS | Status: AC | PRN
  Administered 2019-02-05: 100 mL via INTRAVENOUS

## 2019-02-06 ENCOUNTER — Other Ambulatory Visit (INDEPENDENT_AMBULATORY_CARE_PROVIDER_SITE_OTHER): Payer: Medicare Other

## 2019-02-06 DIAGNOSIS — R945 Abnormal results of liver function studies: Secondary | ICD-10-CM | POA: Diagnosis not present

## 2019-02-06 DIAGNOSIS — R7989 Other specified abnormal findings of blood chemistry: Secondary | ICD-10-CM

## 2019-02-06 DIAGNOSIS — K8309 Other cholangitis: Secondary | ICD-10-CM

## 2019-02-06 LAB — HEPATIC FUNCTION PANEL
ALT: 69 U/L — ABNORMAL HIGH (ref 0–53)
AST: 42 U/L — ABNORMAL HIGH (ref 0–37)
Albumin: 4.8 g/dL (ref 3.5–5.2)
Alkaline Phosphatase: 502 U/L — ABNORMAL HIGH (ref 39–117)
Bilirubin, Direct: 0.2 mg/dL (ref 0.0–0.3)
Total Bilirubin: 0.6 mg/dL (ref 0.2–1.2)
Total Protein: 8.3 g/dL (ref 6.0–8.3)

## 2019-02-07 ENCOUNTER — Encounter: Payer: Self-pay | Admitting: Gastroenterology

## 2019-02-07 ENCOUNTER — Ambulatory Visit (INDEPENDENT_AMBULATORY_CARE_PROVIDER_SITE_OTHER): Payer: Medicare Other | Admitting: Gastroenterology

## 2019-02-07 VITALS — BP 137/80 | HR 96 | Temp 98.0°F | Ht 68.0 in | Wt 195.4 lb

## 2019-02-07 DIAGNOSIS — K219 Gastro-esophageal reflux disease without esophagitis: Secondary | ICD-10-CM | POA: Diagnosis not present

## 2019-02-07 DIAGNOSIS — R1011 Right upper quadrant pain: Secondary | ICD-10-CM

## 2019-02-07 DIAGNOSIS — R945 Abnormal results of liver function studies: Secondary | ICD-10-CM | POA: Diagnosis not present

## 2019-02-07 DIAGNOSIS — Z992 Dependence on renal dialysis: Secondary | ICD-10-CM

## 2019-02-07 DIAGNOSIS — R7989 Other specified abnormal findings of blood chemistry: Secondary | ICD-10-CM

## 2019-02-07 DIAGNOSIS — Z9049 Acquired absence of other specified parts of digestive tract: Secondary | ICD-10-CM

## 2019-02-07 DIAGNOSIS — N186 End stage renal disease: Secondary | ICD-10-CM | POA: Diagnosis not present

## 2019-02-07 DIAGNOSIS — G8929 Other chronic pain: Secondary | ICD-10-CM

## 2019-02-07 NOTE — Progress Notes (Signed)
abd

## 2019-02-07 NOTE — Patient Instructions (Signed)
Follow up in 3 months  If you are age 60 or older, your body mass index should be between 23-30. Your Body mass index is 29.71 kg/m. If this is out of the aforementioned range listed, please consider follow up with your Primary Care Provider.  If you are age 30 or younger, your body mass index should be between 19-25. Your Body mass index is 29.71 kg/m. If this is out of the aformentioned range listed, please consider follow up with your Primary Care Provider.    I appreciate the  opportunity to care for you  Thank You   Harl Bowie , MD

## 2019-02-07 NOTE — Progress Notes (Addendum)
Edgar Brewer    [id number]    [birth year]  Primary Care Physician:Lott, Elwin Sleight, MD  Referring Physician: Vassie Moment, MD [address] [place],  [place] [address]   Chief complaint: Abnormal LFT, right upper quadrant abdominal pain HPI:  59 year old male with history of end-stage renal disease on hemodialysis, diabetes, hypertension, CHF, pulmonary hypertension, hepatic steatosis.  S/p lap chole September 2020 with significant LFT abnormalities currently.  He was readmitted with fever, CT showed 3X 2 cm pocket along the inferior surface of the liver and the cholecystectomy area.  He continues to have persistent LFT abnormality T bili peaked to 7 and alk phos greater than 1400 along with AST and ALT in the 500 range.  HIDA scan did not show any bile leak.   Viral hepatitis negative Hep B surf ag negative, Hep B core IgM and total ab negative, Hep A IgM negative, Hep C ab negative, CMV IgM < 30. EBV IgM < 36. EBV IgG 157. AMA < 20. SMA 5. Iron 55. Ferritin 1823. He was readmitted in October 2020 with fever, worsening jaundice and sepsis.  MRCP showed small amount of gas and fluid in gallbladder fossa without biliary dilation.  He underwent gallbladder fossa percutaneous aspiration and drainage.  Blood cultures were positive for Klebsiella E. coli and Enterococcus bacteremia.  He was discharged home with JP drain in place which has been removed since.  LFT improving slowly as indicated below. Hepatic Function Latest Ref Rng & Units 02/06/2019 01/22/2019 12/27/2018  Total Protein 6.0 - 8.3 g/dL 8.3 7.2 -  Albumin 3.5 - 5.2 g/dL 4.8 4.1 -  AST 0 - 37 U/L 42(H) 68(H) -  ALT 0 - 53 U/L 69(H) 123(H) -  Alk Phosphatase 39 - 117 U/L 502(H) 596(H) 633(H)  Total Bilirubin 0.2 - 1.2 mg/dL 0.6 0.5 -  Bilirubin, Direct 0.0 - 0.3 mg/dL 0.2 0.2 -   He is feeling better overall and has improvement of appetite and is gaining weight.  He is eating well.  Complains of right upper quadrant pain in  the right flank occasionally especially when he is turning or sitting up otherwise no complaints.  Denies any nausea, vomiting, change in bowel habits, melena or blood per rectum.  Outpatient Encounter Medications as of 02/07/2019  Medication Sig  . b complex-vitamin c-folic acid (NEPHRO-VITE) 0.8 MG TABS tablet Take 1 tablet by mouth daily.  . cinacalcet (SENSIPAR) 60 MG tablet Take 60 mg by mouth daily.  Marland Kitchen doxercalciferol (HECTOROL) 4 MCG/2ML injection Inject 5 mLs (10 mcg total) into the vein every Monday, Wednesday, and Friday with hemodialysis.  Marland Kitchen gabapentin (NEURONTIN) 300 MG capsule Take 300 mg by mouth See admin instructions. Take 300 mg by mouth in the morning and 300 mg at bedtime  . ibuprofen (ADVIL) 200 MG tablet Take 200-800 mg by mouth every 8 (eight) hours as needed for headache or mild pain.  Marland Kitchen linagliptin (TRADJENTA) 5 MG TABS tablet Take 5 mg by mouth daily.  Marland Kitchen linezolid (ZYVOX) 600 MG tablet Take 1 tablet (600 mg total) by mouth 2 (two) times daily.  Marland Kitchen omeprazole (PRILOSEC) 20 MG capsule Take 20 mg by mouth daily before breakfast.   . ondansetron (ZOFRAN) 4 MG tablet Take 1 tablet (4 mg total) by mouth every 6 (six) hours as needed for nausea. (Patient taking differently: Take 4 mg by mouth every 6 (six) hours as needed for nausea or vomiting. )  . sevelamer  carbonate (RENVELA) 800 MG tablet Take 2 tablets (1,600 mg total) by mouth 3 (three) times daily with meals.  . carvedilol (COREG) 6.25 MG tablet Take 1 tablet (6.25 mg total) by mouth 2 (two) times daily.   No facility-administered encounter medications on file as of 02/07/2019.    Allergies as of 02/07/2019 - Review Complete 02/07/2019  Allergen Reaction Noted  . Codeine Anaphylaxis, Hives, Swelling, and Other (See Comments) 08/08/2013    Past Medical History:  Diagnosis Date  . Acute on chronic combined systolic and diastolic CHF, NYHA class 1 ([place]) 01/13/2015  . Arthritis   . Chronic kidney disease    MWF [name].  . Diabetes mellitus type 2 with complications ([place])    Previous insulin, non-insulin requiring noted 09/2018.  Complications include retinopathy, peripheral neuropathy.  Marland Kitchen GERD (gastroesophageal reflux disease)    at times  . Head injury with loss of consciousness ([place])   . History of kidney stones   . Hypertension   . Neuropathy   . Pulmonary hypertension (HCC)    PA peak pressure 41 mmHg 03/04/16 echo  . Varicose veins of lower extremities with complications 46/50/3546    Past Surgical History:  Procedure Laterality Date  . AV FISTULA PLACEMENT Left 06/01/2016   Procedure: ARTERIOVENOUS (AV) FISTULA CREATION;  Surgeon: [name], MD;  Location: [facility];  Service: Vascular;  Laterality: Left;  . AV FISTULA PLACEMENT Left 08/10/2017   Procedure: BRACHIOCEPHALIC ARTERIOVENOUS (AV) FISTULA CREATION LEFT UPPER EXTREMITY;  Surgeon: [name], MD;  Location: [facility];  Service: Vascular;  Laterality: Left;  . CHOLECYSTECTOMY N/A 10/04/2018   Procedure: LAPAROSCOPIC CHOLECYSTECTOMY;  Surgeon: Kinsinger, Arta Bruce, MD;  Location: [facility];  Service: General;  Laterality: N/A;  . COLONOSCOPY W/ POLYPECTOMY  03/2016   For evaluation of iron deficiency anemia.  Dr. Silverio Decamp.  10 polyps, largest 20 mm.  Pathology: tubular adenomas with no high-grade dysplasia, hyperplastic.  None bleeding internal hemorrhoids.  Pandiverticulosis.  Marland Kitchen ENDOVENOUS ABLATION SAPHENOUS VEIN W/ LASER Left 07-23-2015   endovenous laser ablation left greater saphenous vein by [name]  . ENDOVENOUS ABLATION SAPHENOUS VEIN W/ LASER Right 10/15/2015   EVLA right greater saphenous vein by [name]  . ESOPHAGOGASTRODUODENOSCOPY  03/2016   For evaluation of IDA.  Dr. Silverio Decamp.  Duodenal erythema, pathology benign mucosa, no villous atrophy.  . IR RADIOLOGIST EVAL & MGMT  11/29/2018  . TEE WITHOUT CARDIOVERSION N/A 11/19/2018   Procedure: TRANSESOPHAGEAL ECHOCARDIOGRAM (TEE);  Surgeon: Acie Fredrickson Wonda Cheng, MD;   Location: [facility] ENDOSCOPY;  Service: Cardiovascular;  Laterality: N/A;    Family History  Problem Relation Age of Onset  . Cancer Father     Social History   Socioeconomic History  . Marital status: Legally Separated    Spouse name: Not on file  . Number of children: Not on file  . Years of education: Not on file  . Highest education level: Not on file  Occupational History  . Not on file  Tobacco Use  . Smoking status: Former Smoker    Packs/day: 1.00    Years: 45.00    Pack years: 45.00    Types: Cigarettes    Quit date: 01/13/2015    Years since quitting: 4.0  . Smokeless tobacco: Never Used  Substance and Sexual Activity  . Alcohol use: Yes    Alcohol/week: 1.0 standard drinks    Types: 1 Glasses of wine per week    Comment: rare occassion  .  Drug use: No  . Sexual activity: Not on file  Other Topics Concern  . Not on file  Social History Narrative  . Not on file   Social Determinants of Health   Financial Resource Strain:   . Difficulty of Paying Living Expenses: Not on file  Food Insecurity: No Food Insecurity  . Worried About Charity fundraiser in the Last Year: Never true  . Ran Out of Food in the Last Year: Never true  Transportation Needs: No Transportation Needs  . Lack of Transportation (Medical): No  . Lack of Transportation (Non-Medical): No  Physical Activity:   . Days of Exercise per Week: Not on file  . Minutes of Exercise per Session: Not on file  Stress:   . Feeling of Stress : Not on file  Social Connections:   . Frequency of Communication with Friends and Family: Not on file  . Frequency of Social Gatherings with Friends and Family: Not on file  . Attends Religious Services: Not on file  . Active Member of Clubs or Organizations: Not on file  . Attends Archivist Meetings: Not on file  . Marital Status: Not on file  Intimate Partner Violence:   . Fear of Current or Ex-Partner: Not on file  . Emotionally Abused: Not on file    . Physically Abused: Not on file  . Sexually Abused: Not on file      Review of systems: Review of Systems  Constitutional: Negative for fever and chills.  HENT: Negative.   Eyes: Negative for blurred vision.  Respiratory: Negative for cough, shortness of breath and wheezing.   Cardiovascular: Negative for chest pain and palpitations.  Gastrointestinal: as per HPI Genitourinary: Negative for dysuria, urgency, frequency and hematuria.  Musculoskeletal: Negative for myalgias, back pain and joint pain.  Skin: Negative for itching and rash.  Neurological: Negative for dizziness, tremors, focal weakness, seizures and loss of consciousness.  Endo/Heme/Allergies: Positive for seasonal allergies.  Psychiatric/Behavioral: Negative for depression, suicidal ideas and hallucinations.  All other systems reviewed and are negative.   Physical Exam: Vitals:   02/07/19 1043  BP: 137/80  Pulse: 96  Temp: 98 F (36.7 C)  SpO2: 98%   Body mass index is 29.71 kg/m. Gen:      No acute distress HEENT:  EOMI, sclera anicteric Neck:     No masses; no thyromegaly Lungs:    Clear to auscultation bilaterally; normal respiratory effort CV:         Regular rate and rhythm; no murmurs Abd:      + bowel sounds; soft, non-tender; no palpable masses, no distension Ext:    No edema; adequate peripheral perfusion Skin:      Warm and dry; no rash Neuro: alert and oriented x 3 Psych: normal mood and affect  Data Reviewed:  Reviewed labs, radiology imaging, old records and pertinent past GI work up   Assessment and Plan/Recommendations:  59 year old male with history of end-stage renal disease on hemodialysis, s/p laparoscopic cholecystectomy September 1438 complicated by gallbladder fossa abscess s/p percutaneous aspiration and JP drain in October 2020.  Sepsis with bacteremia LFT slowly improving.  Bilirubin has returned to normal range and alk phos has trended down to 500s from peak of greater  than 1400 Cholestasis in the setting of sepsis and bacteremia, is slowly improving  Will hold off liver biopsy Repeat LFT in 3 months to continue to monitor Avoid hepatotoxins, NSAIDs over-the-counter herbal remedies and EtOH   RUQ abdominal  discomfort at site of prior JP drain placement Reassured patient, likely secondary to adhesions/muscular strain Ok to use Tylenol up to 2 g daily as needed in divided doses  He is due for surveillance colonoscopy, will plan to discuss at next visit given he is slowly recovering from recent hospitalization History of large adenomatous polyps on last colonoscopy in March 2018  GERD: Continue PPI and antireflux measures  End-stage renal disease on hemodialysis Monday Wednesday Friday  Return in 3 months or sooner if needed  This visit required 33 minutes of patient care (this includes precharting, chart review, review of results, face-to-face time used for counseling as well as treatment plan and follow-up. The patient was provided an opportunity to ask questions and all were answered. The patient agreed with the plan and demonstrated an understanding of the instructions.  Damaris Hippo , MD    CC: Vassie Moment, MD

## 2019-03-12 ENCOUNTER — Encounter: Payer: Self-pay | Admitting: Neurology

## 2019-03-12 ENCOUNTER — Ambulatory Visit: Payer: Medicare Other | Admitting: Neurology

## 2019-03-12 NOTE — Progress Notes (Deleted)
PATIENT: Edgar Brewer DOB: [birth year]  REASON FOR VISIT: follow up HISTORY FROM: patient  HISTORY OF PRESENT ILLNESS: Today 03/12/19  Edgar Brewer is a 59 year old male with history of end-stage renal disease on hemodialysis.  He has been evaluated for possible dementia, as he would like to get a renal transplant.  He has always been a slow learner, he has dyslexia.  He does not drive, due to vision issues.  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  He was to start taking aspirin 81 mg daily.  HISTORY 08/28/2018 Dr. Jannifer Franklin: Edgar Brewer is a 59 year old right-handed white male with a history of end-stage renal disease on hemodialysis.  He would like to be on a list for a renal transplant but there has been some question about a dementia.  The mother indicates that his memory has not altered over time, he has always been a slow learner, he has dyslexia.  The patient does not operate a motor vehicle mainly because of some vision issues.  He has not had any new medical issues that have come up since last seen.  He was set up for a neuropsychological evaluation but he never heard about scheduling for this.  He has not had a scan of the head.  He returns this office for an evaluation.  He claims that he is sleeping fairly well at night, there was some concern previously about excessive daytime drowsiness.   REVIEW OF SYSTEMS: Out of a complete 14 system review of symptoms, the patient complains only of the following symptoms, and all other reviewed systems are negative.  ALLERGIES: Allergies  Allergen Reactions  . Codeine Anaphylaxis, Hives, Swelling and Other (See Comments)    Swelling all over body and the throat    HOME MEDICATIONS: Outpatient Medications Prior to Visit  Medication Sig Dispense Refill  . b complex-vitamin c-folic acid (NEPHRO-VITE) 0.8 MG TABS tablet Take 1 tablet by mouth  daily.    . carvedilol (COREG) 6.25 MG tablet Take 1 tablet (6.25 mg total) by mouth 2 (two) times daily. 60 tablet 0  . cinacalcet (SENSIPAR) 60 MG tablet Take 60 mg by mouth daily.    Marland Kitchen doxercalciferol (HECTOROL) 4 MCG/2ML injection Inject 5 mLs (10 mcg total) into the vein every Monday, Wednesday, and Friday with hemodialysis. 2 mL 0  . gabapentin (NEURONTIN) 300 MG capsule Take 300 mg by mouth See admin instructions. Take 300 mg by mouth in the morning and 300 mg at bedtime    . ibuprofen (ADVIL) 200 MG tablet Take 200-800 mg by mouth every 8 (eight) hours as needed for headache or mild pain.    Marland Kitchen linagliptin (TRADJENTA) 5 MG TABS tablet Take 5 mg by mouth daily.    Marland Kitchen linezolid (ZYVOX) 600 MG tablet Take 1 tablet (600 mg total) by mouth 2 (two) times daily. 14 tablet 0  . omeprazole (PRILOSEC) 20 MG capsule Take 20 mg by mouth daily before breakfast.   2  . ondansetron (ZOFRAN) 4 MG tablet Take 1 tablet (4 mg total) by mouth every 6 (six) hours as needed for nausea. (Patient taking differently: Take 4 mg by mouth every 6 (six) hours as needed for nausea or vomiting. ) 20 tablet 0  . sevelamer carbonate (RENVELA) 800 MG tablet Take 2 tablets (1,600 mg total) by mouth 3 (three) times daily with meals.     No facility-administered medications  prior to visit.    PAST MEDICAL HISTORY: Past Medical History:  Diagnosis Date  . Acute on chronic combined systolic and diastolic CHF, NYHA class 1 ([place]) 01/13/2015  . Arthritis   . Chronic kidney disease    MWF [name].  . Diabetes mellitus type 2 with complications ([place])    Previous insulin, non-insulin requiring noted 09/2018.  Complications include retinopathy, peripheral neuropathy.  Marland Kitchen GERD (gastroesophageal reflux disease)    at times  . Head injury with loss of consciousness ([place])   . History of kidney stones   . Hypertension   . Neuropathy   . Pulmonary hypertension (HCC)    PA peak pressure 41 mmHg 03/04/16 echo  . Varicose veins of  lower extremities with complications 41/28/7867    PAST SURGICAL HISTORY: Past Surgical History:  Procedure Laterality Date  . AV FISTULA PLACEMENT Left 06/01/2016   Procedure: ARTERIOVENOUS (AV) FISTULA CREATION;  Surgeon: [name], MD;  Location: [facility];  Service: Vascular;  Laterality: Left;  . AV FISTULA PLACEMENT Left 08/10/2017   Procedure: BRACHIOCEPHALIC ARTERIOVENOUS (AV) FISTULA CREATION LEFT UPPER EXTREMITY;  Surgeon: [name], MD;  Location: [facility];  Service: Vascular;  Laterality: Left;  . CHOLECYSTECTOMY N/A 10/04/2018   Procedure: LAPAROSCOPIC CHOLECYSTECTOMY;  Surgeon: Kinsinger, Arta Bruce, MD;  Location: [facility];  Service: General;  Laterality: N/A;  . COLONOSCOPY W/ POLYPECTOMY  03/2016   For evaluation of iron deficiency anemia.  Dr. Silverio Decamp.  10 polyps, largest 20 mm.  Pathology: tubular adenomas with no high-grade dysplasia, hyperplastic.  None bleeding internal hemorrhoids.  Pandiverticulosis.  Marland Kitchen ENDOVENOUS ABLATION SAPHENOUS VEIN W/ LASER Left 07-23-2015   endovenous laser ablation left greater saphenous vein by [name]  . ENDOVENOUS ABLATION SAPHENOUS VEIN W/ LASER Right 10/15/2015   EVLA right greater saphenous vein by [name]  . ESOPHAGOGASTRODUODENOSCOPY  03/2016   For evaluation of IDA.  Dr. Silverio Decamp.  Duodenal erythema, pathology benign mucosa, no villous atrophy.  . IR RADIOLOGIST EVAL & MGMT  11/29/2018  . TEE WITHOUT CARDIOVERSION N/A 11/19/2018   Procedure: TRANSESOPHAGEAL ECHOCARDIOGRAM (TEE);  Surgeon: Acie Fredrickson Wonda Cheng, MD;  Location: [facility] ENDOSCOPY;  Service: Cardiovascular;  Laterality: N/A;    FAMILY HISTORY: Family History  Problem Relation Age of Onset  . Cancer Father     SOCIAL HISTORY: Social History   Socioeconomic History  . Marital status: Legally Separated    Spouse name: Not on file  . Number of children: Not on file  . Years of education: Not on file  . Highest education level: Not on file  Occupational  History  . Not on file  Tobacco Use  . Smoking status: Former Smoker    Packs/day: 1.00    Years: 45.00    Pack years: 45.00    Types: Cigarettes    Quit date: 01/13/2015    Years since quitting: 4.1  . Smokeless tobacco: Never Used  Substance and Sexual Activity  . Alcohol use: Yes    Alcohol/week: 1.0 standard drinks    Types: 1 Glasses of wine per week    Comment: rare occassion  . Drug use: No  . Sexual activity: Not on file  Other Topics Concern  . Not on file  Social History Narrative  . Not on file   Social Determinants of Health   Financial Resource Strain:   . Difficulty of Paying Living Expenses: Not on file  Food Insecurity: No Food Insecurity  . Worried About Running  Out of Food in the Last Year: Never true  . Ran Out of Food in the Last Year: Never true  Transportation Needs: No Transportation Needs  . Lack of Transportation (Medical): No  . Lack of Transportation (Non-Medical): No  Physical Activity:   . Days of Exercise per Week: Not on file  . Minutes of Exercise per Session: Not on file  Stress:   . Feeling of Stress : Not on file  Social Connections:   . Frequency of Communication with Friends and Family: Not on file  . Frequency of Social Gatherings with Friends and Family: Not on file  . Attends Religious Services: Not on file  . Active Member of Clubs or Organizations: Not on file  . Attends Archivist Meetings: Not on file  . Marital Status: Not on file  Intimate Partner Violence:   . Fear of Current or Ex-Partner: Not on file  . Emotionally Abused: Not on file  . Physically Abused: Not on file  . Sexually Abused: Not on file      PHYSICAL EXAM  There were no vitals filed for this visit. There is no height or weight on file to calculate BMI.  Generalized: Well developed, in no acute distress   Neurological examination  Mentation: Alert oriented to time, place, history taking. Follows all commands speech and language  fluent Cranial nerve II-XII: Pupils were equal round reactive to light. Extraocular movements were full, visual field were full on confrontational test. Facial sensation and strength were normal. Uvula tongue midline. Head turning and shoulder shrug  were normal and symmetric. Motor: The motor testing reveals 5 over 5 strength of all 4 extremities. Good symmetric motor tone is noted throughout.  Sensory: Sensory testing is intact to soft touch on all 4 extremities. No evidence of extinction is noted.  Coordination: Cerebellar testing reveals good finger-nose-finger and heel-to-shin bilaterally.  Gait and station: Gait is normal. Tandem gait is normal. Romberg is negative. No drift is seen.  Reflexes: Deep tendon reflexes are symmetric and normal bilaterally.   DIAGNOSTIC DATA (LABS, IMAGING, TESTING) - I reviewed patient records, labs, notes, testing and imaging myself where available.  Lab Results  Component Value Date   WBC 7.3 12/19/2018   HGB 11.5 (L) 12/19/2018   HCT 33.7 (L) 12/19/2018   MCV 90.6 12/19/2018   PLT 264 12/19/2018      Component Value Date/Time   NA 136 12/19/2018 1015   NA 140 03/04/2016 1317   K 4.3 12/19/2018 1015   CL 91 (L) 12/19/2018 1015   CO2 32 12/19/2018 1015   GLUCOSE 207 (H) 12/19/2018 1015   BUN 26 (H) 12/19/2018 1015   BUN 47 (H) 03/04/2016 1317   CREATININE 4.06 (H) 12/19/2018 1015   CALCIUM 8.5 (L) 12/19/2018 1015   PROT 8.3 02/06/2019 1635   PROT 5.2 (L) 02/29/2016 1439   ALBUMIN 4.8 02/06/2019 1635   ALBUMIN 3.1 (L) 02/29/2016 1439   AST 42 (H) 02/06/2019 1635   ALT 69 (H) 02/06/2019 1635   ALKPHOS 502 (H) 02/06/2019 1635   BILITOT 0.6 02/06/2019 1635   BILITOT 0.2 02/29/2016 1439   GFRNONAA 10 (L) 11/20/2018 0320   GFRAA 11 (L) 11/20/2018 0320   No results found for: CHOL, HDL, LDLCALC, LDLDIRECT, TRIG, CHOLHDL Lab Results  Component Value Date   HGBA1C 7.3 (H) 11/15/2018   Lab Results  Component Value Date   MGNOIBBC48 889  12/25/2017   Lab Results  Component Value Date  TSH 1.256 01/13/2015      ASSESSMENT AND PLAN 60 y.o. year old male  has a past medical history of Acute on chronic combined systolic and diastolic CHF, NYHA class 1 ([place]) (01/13/2015), Arthritis, Chronic kidney disease, Diabetes mellitus type 2 with complications ([place]), GERD (gastroesophageal reflux disease), Head injury with loss of consciousness ([place]), History of kidney stones, Hypertension, Neuropathy, Pulmonary hypertension ([place]), and Varicose veins of lower extremities with complications (75/44/9201). here with ***   I spent 15 minutes with the patient. 50% of this time was spent   Butler Denmark, [place], DNP 03/12/2019, 6:04 AM [facility] [address], [place], [place] [address] [phone]

## 2019-07-29 NOTE — Progress Notes (Signed)
CARDIOLOGY OFFICE NOTE  Date:  07/30/2019    Edgar Brewer Date of Birth: [birth year] Medical Record #[id number]  PCP:  Vassie Moment, MD  Cardiologist:  Andrez Grime chief complaint on file.   History of Present Illness: Edgar Brewer is a 59 y.o. male who presents today for f/u diastolic CHF and mild AS He has CRF with Cr now 7.4 Now on dialysis  Has left brachio cephalic fistula. Also with anemia, DM, neuropathy and LE Varicosities. History of anemia due to CRF EGD 04/05/16 duodenitis and colon with multiple polyps removed and Diverticular disease.   TTE Reviewed 11/07/17 EF 60-65% mild AS mean gradient 11 mmHg mild MR He had f/u TEE/TTE 10/24 and 10/26 when in hospital for bacteremia and commented On only AV sclerosis and not stenosis  He is a big Electrical engineer to talk sports and thinks Edgar Brewer stinks   Had laparoscopic cholecystectomy on 10/04/18 readmitted with ? Seroma vs biloma or abscess started on antibiotics HIDA negative for bile leak but outpatient still with elevated alk phos and ESR 97 Seeing Dr Silverio Decamp Readmitted for percutaneous drainage and more antibiotic Rx October 2020  No cardiac complaints     Past Medical History:  Diagnosis Date  . Acute on chronic combined systolic and diastolic CHF, NYHA class 1 ([place]) 01/13/2015  . Arthritis   . Chronic kidney disease    MWF [name].  . Diabetes mellitus type 2 with complications ([place])    Previous insulin, non-insulin requiring noted 09/2018.  Complications include retinopathy, peripheral neuropathy.  Marland Kitchen GERD (gastroesophageal reflux disease)    at times  . Head injury with loss of consciousness ([place])   . History of kidney stones   . Hypertension   . Neuropathy   . Pulmonary hypertension (HCC)    PA peak pressure 41 mmHg 03/04/16 echo  . Varicose veins of lower extremities with complications 41/63/8453    Past Surgical History:  Procedure Laterality Date  . AV FISTULA PLACEMENT Left 06/01/2016    Procedure: ARTERIOVENOUS (AV) FISTULA CREATION;  Surgeon: [name], MD;  Location: [place];  Service: Vascular;  Laterality: Left;  . AV FISTULA PLACEMENT Left 08/10/2017   Procedure: BRACHIOCEPHALIC ARTERIOVENOUS (AV) FISTULA CREATION LEFT UPPER EXTREMITY;  Surgeon: [name], MD;  Location: [place];  Service: Vascular;  Laterality: Left;  . CHOLECYSTECTOMY N/A 10/04/2018   Procedure: LAPAROSCOPIC CHOLECYSTECTOMY;  Surgeon: Kinsinger, Arta Bruce, MD;  Location: [place];  Service: General;  Laterality: N/A;  . COLONOSCOPY W/ POLYPECTOMY  03/2016   For evaluation of iron deficiency anemia.  Dr. Silverio Decamp.  10 polyps, largest 20 mm.  Pathology: tubular adenomas with no high-grade dysplasia, hyperplastic.  None bleeding internal hemorrhoids.  Pandiverticulosis.  Marland Kitchen ENDOVENOUS ABLATION SAPHENOUS VEIN W/ LASER Left 07-23-2015   endovenous laser ablation left greater saphenous vein by [name]  . ENDOVENOUS ABLATION SAPHENOUS VEIN W/ LASER Right 10/15/2015   EVLA right greater saphenous vein by [name]  . ESOPHAGOGASTRODUODENOSCOPY  03/2016   For evaluation of IDA.  Dr. Silverio Decamp.  Duodenal erythema, pathology benign mucosa, no villous atrophy.  . IR RADIOLOGIST EVAL & MGMT  11/29/2018  . TEE WITHOUT CARDIOVERSION N/A 11/19/2018   Procedure: TRANSESOPHAGEAL ECHOCARDIOGRAM (TEE);  Surgeon: Acie Fredrickson Wonda Cheng, MD;  Location: [facility] ENDOSCOPY;  Service: Cardiovascular;  Laterality: N/A;     Medications: Current Outpatient Medications  Medication Sig Dispense Refill  . aspirin EC 81 MG tablet Take 81 mg by  mouth daily. Swallow whole.    . b complex-vitamin c-folic acid (NEPHRO-VITE) 0.8 MG TABS tablet Take 1 tablet by mouth daily.    . cinacalcet (SENSIPAR) 60 MG tablet Take 60 mg by mouth daily.    Marland Kitchen gabapentin (NEURONTIN) 300 MG capsule Take 300 mg by mouth See admin instructions. Take 300 mg by mouth in the morning and 300 mg at bedtime    . ibuprofen (ADVIL) 200 MG tablet Take 200-800  mg by mouth every 8 (eight) hours as needed for headache or mild pain.    Marland Kitchen linagliptin (TRADJENTA) 5 MG TABS tablet Take 5 mg by mouth daily.    Marland Kitchen linezolid (ZYVOX) 600 MG tablet Take 1 tablet (600 mg total) by mouth 2 (two) times daily. 14 tablet 0  . omeprazole (PRILOSEC) 20 MG capsule Take 20 mg by mouth daily before breakfast.   2  . sevelamer carbonate (RENVELA) 800 MG tablet Take 2 tablets (1,600 mg total) by mouth 3 (three) times daily with meals.     No current facility-administered medications for this visit.    Allergies: Allergies  Allergen Reactions  . Codeine Anaphylaxis, Hives, Swelling and Other (See Comments)    Swelling all over body and the throat    Social History: The patient  reports that he quit smoking about 4 years ago. His smoking use included cigarettes. He has a 45.00 pack-year smoking history. He has never used smokeless tobacco. He reports current alcohol use of about 1.0 standard drink of alcohol per week. He reports that he does not use drugs.   Family History: The patient's family history includes Cancer in his father.   Review of Systems: Please see the history of present illness.   Otherwise, the review of systems is positive for none.   All other systems are reviewed and negative.   Physical Exam: VS:  BP 130/78   Pulse 92   Ht _0  (1.727 m)   Wt 205 lb (93 kg)   SpO2 97%   BMI 31.17 kg/m  .  BMI Body mass index is 31.17 kg/m.  Wt Readings from Last 3 Encounters:  07/30/19 205 lb (93 kg)  02/07/19 195 lb 6.4 oz (88.6 kg)  12/13/18 185 lb (83.9 kg)    Affect appropriate Healthy:  appears stated age 80: normal Neck supple with no adenopathy JVP normal no bruits no thyromegaly Lungs clear with no wheezing and good diaphragmatic motion Heart:  S1/S2 preserved mild AS murmur, no rub, gallop or click PMI normal Abdomen: benighn, BS positve, no tenderness, no AAA no bruit.  No HSM or HJR Large left upper arm fistula with thrill  No  edema Neuro non-focal Skin warm and dry No muscular weakness   LABORATORY DATA:  EKG:   07/06/16 NSR with nonspecific T wave changes.  Lab Results  Component Value Date   WBC 7.3 12/19/2018   HGB 11.5 (L) 12/19/2018   HCT 33.7 (L) 12/19/2018   PLT 264 12/19/2018   GLUCOSE 207 (H) 12/19/2018   ALT 69 (H) 02/06/2019   AST 42 (H) 02/06/2019   NA 136 12/19/2018   K 4.3 12/19/2018   CL 91 (L) 12/19/2018   CREATININE 4.06 (H) 12/19/2018   BUN 26 (H) 12/19/2018   CO2 32 12/19/2018   TSH 1.256 01/13/2015   INR 1.0 11/14/2018   HGBA1C 7.3 (H) 11/15/2018    BNP (last 3 results) No results for input(s): BNP in the last 8760 hours.  ProBNP (last 3  results) No results for input(s): PROBNP in the last 8760 hours.   Other Studies Reviewed Today:  Myoview Study Highlights from 05/2014    Myocardial perfusion is normal. The study is abormal. This is an intermediate risk study. Overall left ventricular systolic function was abnormal. LV cavity size is normal. The left ventricular ejection fraction is moderately decreased (42%). There is no prior study for comparison.   Notes Recorded by Josue Hector, MD on 06/25/2014 at 8:31 AM Increase cozaar to 100 mg decrease lasix to once/day F/u with me to discuss cath EF low   Echo Study Conclusions from 05/2014  - Left ventricle: The cavity size was mildly dilated. Wall   thickness was increased in a pattern of mild LVH. Systolic   function was mildly reduced. The estimated ejection fraction was   in the range of 45% to 50%. Diffuse hypokinesis. Doppler   parameters are consistent with elevated ventricular end-diastolic   filling pressure. - Mitral valve: There was mild regurgitation. - Left atrium: The atrium was mildly dilated. - Atrial septum: No defect or patent foramen ovale was identified. - Pulmonary arteries: PA peak pressure: 52 mm Hg (S). - Pericardium, extracardiac: Small to moderate circumferential   pericardial effusion  no tamponade.   Assessment/Plan: 1. Anemia:  Multiple polyps removed f/u GI and CKD f/u primary   2. Chronic diastolic HF -  Not clear that this is an issue suspect fluid from A/CRF continue lasix  3. Varicose veins/venous insufficiency - chronic. Refer back to Dr Early ? Sclerosing/ablative Rx  4. CKD -   Now on dialysis  AV fistula done 06/01/16 by Dr Early F/U Dr Lorrene Reid   5. DM - Discussed low carb diet.  Target hemoglobin A1c is 6.5 or less.  Continue current medications. A1c in September poor control 8.4   6. Prior tobacco abuse - tells me he is not smoking CT 11/15/18 no cancer  7. Aortic Stenosis :  Very mild considering fistula with mean gradient 11 mmHg TTE done 11/07/17 and no progression on recent TEE/TTE done October 2020   8. GI:  F/U Dr Silverio Decamp post cholecystectomy with rehospitalization for elevated alk phos  And percutaneous aspiration of gallbladder fossa Rx Klebsiella, Ecoli and Enterococcus bacteremia   Edgar Brewer

## 2019-07-30 ENCOUNTER — Encounter: Payer: Self-pay | Admitting: Cardiovascular Disease

## 2019-07-30 ENCOUNTER — Ambulatory Visit (INDEPENDENT_AMBULATORY_CARE_PROVIDER_SITE_OTHER): Payer: Medicare Other | Admitting: Cardiovascular Disease

## 2019-07-30 ENCOUNTER — Other Ambulatory Visit: Payer: Self-pay

## 2019-07-30 VITALS — BP 130/78 | HR 92 | Ht 68.0 in | Wt 205.0 lb

## 2019-07-30 DIAGNOSIS — I350 Nonrheumatic aortic (valve) stenosis: Secondary | ICD-10-CM

## 2019-07-30 NOTE — Patient Instructions (Signed)

## 2020-02-24 NOTE — Progress Notes (Signed)
Phone: [phone]   Subjective:  Patient presents today to establish care.  Prior patient of Dr. Lavonia Drafts.  Chief Complaint  Patient presents with  . New Patient (Initial Visit)    See problem oriented charting  The following were reviewed and entered/updated in epic: Past Medical History:  Diagnosis Date  . Acute on chronic combined systolic and diastolic CHF, NYHA class 1 ([place]) 01/13/2015  . Allergy   . Arthritis   . Chronic kidney disease    MWF [name].  . Diabetes mellitus type 2 with complications ([place])    Previous insulin, non-insulin requiring noted 09/2018.  Complications include retinopathy, peripheral neuropathy.  Marland Kitchen GERD (gastroesophageal reflux disease)    at times  . Head injury with loss of consciousness ([place])   . History of kidney stones   . Hypertension   . Neuromuscular disorder ([place])   . Neuropathy   . Pulmonary hypertension (HCC)    PA peak pressure 41 mmHg 03/04/16 echo  . Stroke ([place])   . Varicose veins of lower extremities with complications 11/17/8525   Patient Active Problem List   Diagnosis Date Noted  . ESRD (end stage renal disease) on dialysis ([place]) 10/03/2018    Priority: High  . Diabetes mellitus type II, non insulin dependent ([place]) 01/14/2015    Priority: High  . Chronic diastolic CHF (congestive heart failure) ([place]) 01/13/2015    Priority: High  . Aortic stenosis 02/25/2020    Priority: Medium  . Gallbladder abscess 11/15/2018    Priority: Medium  . Elevated LFTs 10/18/2018    Priority: Medium  . Other disorders of phosphorus metabolism 01/19/2018    Priority: Medium  . Secondary hyperparathyroidism of renal origin ([place]) 09/27/2017    Priority: Medium  . Hypertension associated with diabetes ([place]) 01/14/2015    Priority: Medium  . Anemia of chronic kidney failure 01/14/2015    Priority: Medium  . Varicose veins of lower extremities with complications 78/24/2353    Priority: Medium  . CKD (chronic kidney disease), stage IV ([place])  01/14/2015    Priority: Low  . Obesity 01/14/2015    Priority: Low  . Chronic venous insufficiency 10/22/2014    Priority: Low  . Type 2 diabetes mellitus with diabetic neuropathy, unspecified ([place]) 02/25/2020   Past Surgical History:  Procedure Laterality Date  . AV FISTULA PLACEMENT Left 06/01/2016   Procedure: ARTERIOVENOUS (AV) FISTULA CREATION;  Surgeon: [name], MD;  Location: [place];  Service: Vascular;  Laterality: Left;  . AV FISTULA PLACEMENT Left 08/10/2017   Procedure: BRACHIOCEPHALIC ARTERIOVENOUS (AV) FISTULA CREATION LEFT UPPER EXTREMITY;  Surgeon: [name], MD;  Location: [place];  Service: Vascular;  Laterality: Left;  . CHOLECYSTECTOMY N/A 10/04/2018   Procedure: LAPAROSCOPIC CHOLECYSTECTOMY;  Surgeon: Kinsinger, Arta Bruce, MD;  Location: [place];  Service: General;  Laterality: N/A;  . COLONOSCOPY W/ POLYPECTOMY  03/2016   For evaluation of iron deficiency anemia.  Dr. Silverio Decamp.  10 polyps, largest 20 mm.  Pathology: tubular adenomas with no high-grade dysplasia, hyperplastic.  None bleeding internal hemorrhoids.  Pandiverticulosis.  Marland Kitchen ENDOVENOUS ABLATION SAPHENOUS VEIN W/ LASER Left 07-23-2015   endovenous laser ablation left greater saphenous vein by [name]  . ENDOVENOUS ABLATION SAPHENOUS VEIN W/ LASER Right 10/15/2015   EVLA right greater saphenous vein by [name]  . ESOPHAGOGASTRODUODENOSCOPY  03/2016   For evaluation of IDA.  Dr. Silverio Decamp.  Duodenal erythema, pathology benign mucosa, no villous atrophy.  Marland Kitchen GALLBLADDER SURGERY  10/2018  .  IR RADIOLOGIST EVAL & MGMT  11/29/2018  . TEE WITHOUT CARDIOVERSION N/A 11/19/2018   Procedure: TRANSESOPHAGEAL ECHOCARDIOGRAM (TEE);  Surgeon: Acie Fredrickson Wonda Cheng, MD;  Location: [facility] ENDOSCOPY;  Service: Cardiovascular;  Laterality: N/A;    Family History  Problem Relation Age of Onset  . Arthritis Mother   . Hearing loss Father   . Prostate cancer Father   . Hyperlipidemia Maternal Grandmother   .  Heart disease Maternal Grandmother   . Lung cancer Maternal Grandfather     Medications- reviewed and updated Current Outpatient Medications  Medication Sig Dispense Refill  . aspirin EC 81 MG tablet Take 81 mg by mouth daily. Swallow whole.    . b complex-vitamin c-folic acid (NEPHRO-VITE) 0.8 MG TABS tablet Take 1 tablet by mouth daily.    . cinacalcet (SENSIPAR) 60 MG tablet Take 60 mg by mouth daily.    Marland Kitchen gabapentin (NEURONTIN) 300 MG capsule Take 300 mg by mouth See admin instructions. Take 300 mg by mouth in the morning and 300 mg at bedtime    . omeprazole (PRILOSEC) 20 MG capsule Take 20 mg by mouth daily before breakfast.   2  . sevelamer carbonate (RENVELA) 800 MG tablet Take 2 tablets (1,600 mg total) by mouth 3 (three) times daily with meals.    Marland Kitchen linagliptin (TRADJENTA) 5 MG TABS tablet Take 1 tablet (5 mg total) by mouth daily. 90 tablet 3   No current facility-administered medications for this visit.    Allergies-reviewed and updated Allergies  Allergen Reactions  . Codeine Anaphylaxis, Hives, Swelling and Other (See Comments)    Swelling all over body and the throat    Social History   Social History Narrative   Separated for many years over 76 years/practical divorce. No Kids. No pets.  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  Ext: no edema, fistula RUE , Bps checked left arm Skin: warm, dry    Assessment and Plan:   #End-stage renal disease on hemodialysis since January 2019.  through related to diabtes #Secondary hyperparathyroidism S: Patient on dialysis followed by Dr. Lorrene Reid on MWF at [facility] site  -AV fistula 06/01/2016 by Dr. Donnetta Hutching A/P: stable-continue dialysis Monday Wednesday Friday -Patient compliant with Sensipar for secondary hyperparathyroidism -On Renvela phosphate binder -offered to send labs to dialysis but he declines  #Heart failure with  ejection fraction-follows with Dr. [name] S: Medication: lasix in past, now managed by dialysis  Edema: none- managed by dialysis A/P: stable with dialysis- continue to monitor   #hypertension S: medication: none Home readings #s: does not check A/P: normal BP today or repeat- continue to monitor off meds- he had to come of meds with dialysis as was getting some lows  #Aortic stenosis S:Last echocardiogram October 2020-reported as very mild considering fistula by cardiology- on formal notes from 11/19/2018 states moderate to mild aortic valve sclerosis A/P: continue follow up with cardiology- defer  to their expertise    # Diabetes with neuropathy S: Medication:Tradjenta 5 mg, gabapentin 300mg  BID Gabapentin helps neuropathy. Not sure of last a1c A/P: hoping a1c at least perhaps under 8- want to avoid lows with him as well. For neuropathy continue gabapentin 300 mg BID- I would double check dialysis info for medicaiton before we refilled  #history of stroke- now on aspirin 81mg - stutter lingers from this #hyperlipidemia S: Medication: states not on statin as of 2022  A/P: not sure why not on statin- update lipids with goal LDL at least under 70 with stroke history  #Varicose veins/venous insufficiency S:Cardiology has considered referral back to Dr. Donnetta Hutching of vein and vascular- has had procedures in past A/P: patient states stable and declines intervention   #Elevated LFTs S: Patient with laparoscopic cholecystectomy September 3149 complicated by gallbladder fossa abscess status post percutaneous aspiration  and JP drain in October 2020.  Ultimately developed sepsis with bacteremia.  LFTs were slowly improving per notes on February 07, 2019 visit with Dr. Dyann Kief was for repeat LFTs in 3 months, avoid hepatotoxins, NSAIDs, alcohol, OTC meds -They also are planning for repeat colonoscopy next visit A/P: hopefully improved- update LFTs    # former smoker- get AAA scan at 11 potentially. Offered lung cancer screening program - declines this, psa (due to possible cost) and colon cancer screening despite polyp history- he recognizes risk for cancer but still declines   Recommended follow up: Return in about 4 months (around 06/24/2020) for follow up- or sooner if needed.   Meds ordered this encounter  Medications  . linagliptin (TRADJENTA) 5 MG TABS tablet    Sig: Take 1 tablet (5 mg total) by mouth daily.    Dispense:  90 tablet    Refill:  3   Time Spent: 48 minutes of total time (9:45 AM- 10:33 AM) was spent on the date of the encounter performing the following actions: chart review prior to seeing the patient, obtaining history, performing a medically necessary exam, counseling on the treatment plan, placing orders, and documenting in our EHR.   Return precautions advised. Garret Reddish, MD

## 2020-02-24 NOTE — Patient Instructions (Addendum)
Please stop by lab before you go If you have mychart- we will send your results within 3 business days of [place] receiving them.  If you do not have mychart- we will call you about results within 5 business days of [place] receiving them.  *please also note that you will see labs on mychart as soon as they post. I will later go in and write notes on them- will say "notes from Dr. Yong Channel"  Health Maintenance Due  Topic Date Due  . OPHTHALMOLOGY EXAM Sign release form at checkout. Never done  . COLONOSCOPY - declines for now 04/11/2017  . COVID-19 Vaccine - Will call back with dates.  11/02/2019   Recommended follow up: Return in about 4 months (around 06/24/2020) for follow up- or sooner if needed.

## 2020-02-25 ENCOUNTER — Ambulatory Visit (INDEPENDENT_AMBULATORY_CARE_PROVIDER_SITE_OTHER): Payer: Medicare Other | Admitting: Family Medicine

## 2020-02-25 ENCOUNTER — Other Ambulatory Visit: Payer: Self-pay

## 2020-02-25 ENCOUNTER — Encounter: Payer: Self-pay | Admitting: Family Medicine

## 2020-02-25 VITALS — BP 128/60 | HR 84 | Temp 98.0°F | Ht 68.0 in | Wt 211.6 lb

## 2020-02-25 DIAGNOSIS — N186 End stage renal disease: Secondary | ICD-10-CM

## 2020-02-25 DIAGNOSIS — E1159 Type 2 diabetes mellitus with other circulatory complications: Secondary | ICD-10-CM

## 2020-02-25 DIAGNOSIS — E119 Type 2 diabetes mellitus without complications: Secondary | ICD-10-CM | POA: Diagnosis not present

## 2020-02-25 DIAGNOSIS — E1140 Type 2 diabetes mellitus with diabetic neuropathy, unspecified: Secondary | ICD-10-CM

## 2020-02-25 DIAGNOSIS — Z86010 Personal history of colonic polyps: Secondary | ICD-10-CM

## 2020-02-25 DIAGNOSIS — Z8673 Personal history of transient ischemic attack (TIA), and cerebral infarction without residual deficits: Secondary | ICD-10-CM | POA: Insufficient documentation

## 2020-02-25 DIAGNOSIS — I5032 Chronic diastolic (congestive) heart failure: Secondary | ICD-10-CM | POA: Diagnosis not present

## 2020-02-25 DIAGNOSIS — I83893 Varicose veins of bilateral lower extremities with other complications: Secondary | ICD-10-CM

## 2020-02-25 DIAGNOSIS — I152 Hypertension secondary to endocrine disorders: Secondary | ICD-10-CM

## 2020-02-25 DIAGNOSIS — R7989 Other specified abnormal findings of blood chemistry: Secondary | ICD-10-CM

## 2020-02-25 DIAGNOSIS — Z125 Encounter for screening for malignant neoplasm of prostate: Secondary | ICD-10-CM

## 2020-02-25 DIAGNOSIS — N2581 Secondary hyperparathyroidism of renal origin: Secondary | ICD-10-CM

## 2020-02-25 DIAGNOSIS — Z8042 Family history of malignant neoplasm of prostate: Secondary | ICD-10-CM

## 2020-02-25 DIAGNOSIS — Z87891 Personal history of nicotine dependence: Secondary | ICD-10-CM

## 2020-02-25 DIAGNOSIS — Z992 Dependence on renal dialysis: Secondary | ICD-10-CM

## 2020-02-25 DIAGNOSIS — I350 Nonrheumatic aortic (valve) stenosis: Secondary | ICD-10-CM

## 2020-02-25 LAB — CBC WITH DIFFERENTIAL/PLATELET
Basophils Absolute: 0.1 10*3/uL (ref 0.0–0.1)
Basophils Relative: 1.2 % (ref 0.0–3.0)
Eosinophils Absolute: 0.2 10*3/uL (ref 0.0–0.7)
Eosinophils Relative: 2.4 % (ref 0.0–5.0)
HCT: 32.9 % — ABNORMAL LOW (ref 39.0–52.0)
Hemoglobin: 11.3 g/dL — ABNORMAL LOW (ref 13.0–17.0)
Lymphocytes Relative: 18.4 % (ref 12.0–46.0)
Lymphs Abs: 1.2 10*3/uL (ref 0.7–4.0)
MCHC: 34.3 g/dL (ref 30.0–36.0)
MCV: 86.6 fl (ref 78.0–100.0)
Monocytes Absolute: 0.4 10*3/uL (ref 0.1–1.0)
Monocytes Relative: 5.5 % (ref 3.0–12.0)
Neutro Abs: 4.8 10*3/uL (ref 1.4–7.7)
Neutrophils Relative %: 72.5 % (ref 43.0–77.0)
Platelets: 220 10*3/uL (ref 150.0–400.0)
RBC: 3.8 Mil/uL — ABNORMAL LOW (ref 4.22–5.81)
RDW: 13.8 % (ref 11.5–15.5)
WBC: 6.6 10*3/uL (ref 4.0–10.5)

## 2020-02-25 LAB — COMPREHENSIVE METABOLIC PANEL
ALT: 39 U/L (ref 0–53)
AST: 25 U/L (ref 0–37)
Albumin: 4 g/dL (ref 3.5–5.2)
Alkaline Phosphatase: 362 U/L — ABNORMAL HIGH (ref 39–117)
BUN: 37 mg/dL — ABNORMAL HIGH (ref 6–23)
CO2: 30 mEq/L (ref 19–32)
Calcium: 8.8 mg/dL (ref 8.4–10.5)
Chloride: 93 mEq/L — ABNORMAL LOW (ref 96–112)
Creatinine, Ser: 5.84 mg/dL (ref 0.40–1.50)
GFR: 9.82 mL/min — CL (ref >60.00)
Glucose, Bld: 246 mg/dL — ABNORMAL HIGH (ref 70–99)
Potassium: 4.4 mEq/L (ref 3.5–5.1)
Sodium: 132 mEq/L — ABNORMAL LOW (ref 135–145)
Total Bilirubin: 0.5 mg/dL (ref 0.2–1.2)
Total Protein: 7.1 g/dL (ref 6.0–8.3)

## 2020-02-25 LAB — LDL CHOLESTEROL, DIRECT: Direct LDL: 64 mg/dL

## 2020-02-25 LAB — HEMOGLOBIN A1C: Hgb A1c MFr Bld: 8.2 % — ABNORMAL HIGH (ref 4.6–6.5)

## 2020-02-25 LAB — LIPID PANEL
Cholesterol: 212 mg/dL — ABNORMAL HIGH (ref 0–200)
HDL: 38.4 mg/dL — ABNORMAL LOW (ref >39.00)
Total CHOL/HDL Ratio: 6
Triglycerides: 433 mg/dL — ABNORMAL HIGH (ref 0.0–149.0)

## 2020-02-25 MED ORDER — LINAGLIPTIN 5 MG PO TABS: 5.0000 mg | ORAL_TABLET | Freq: Every day | ORAL | 3 refills | Status: DC

## 2020-02-27 ENCOUNTER — Other Ambulatory Visit: Payer: Self-pay

## 2020-02-27 MED ORDER — ROSUVASTATIN CALCIUM 10 MG PO TABS: 10.0000 mg | ORAL_TABLET | Freq: Every day | ORAL | 3 refills | Status: DC

## 2020-03-17 ENCOUNTER — Ambulatory Visit (HOSPITAL_COMMUNITY)
Admission: EM | Admit: 2020-03-17 | Discharge: 2020-03-17 | Disposition: A | Discharge status: Home / Self Care | Payer: Medicare Other | Attending: Student | Admitting: Student

## 2020-03-17 ENCOUNTER — Other Ambulatory Visit: Payer: Self-pay

## 2020-03-17 ENCOUNTER — Encounter (HOSPITAL_COMMUNITY): Payer: Self-pay

## 2020-03-17 DIAGNOSIS — Z8679 Personal history of other diseases of the circulatory system: Secondary | ICD-10-CM | POA: Diagnosis present

## 2020-03-17 DIAGNOSIS — Z992 Dependence on renal dialysis: Secondary | ICD-10-CM | POA: Insufficient documentation

## 2020-03-17 DIAGNOSIS — J069 Acute upper respiratory infection, unspecified: Secondary | ICD-10-CM

## 2020-03-17 DIAGNOSIS — E1142 Type 2 diabetes mellitus with diabetic polyneuropathy: Secondary | ICD-10-CM | POA: Diagnosis present

## 2020-03-17 DIAGNOSIS — Z1152 Encounter for screening for COVID-19: Secondary | ICD-10-CM

## 2020-03-17 DIAGNOSIS — N186 End stage renal disease: Secondary | ICD-10-CM | POA: Diagnosis present

## 2020-03-17 MED ORDER — BENZONATATE 100 MG PO CAPS: 100.0000 mg | ORAL_CAPSULE | Freq: Three times a day (TID) | ORAL | 0 refills | Status: DC

## 2020-03-17 MED ORDER — PROMETHAZINE-DM 6.25-15 MG/5ML PO SYRP: 5.0000 mL | ORAL_SOLUTION | Freq: Four times a day (QID) | ORAL | 0 refills | Status: DC | PRN

## 2020-03-17 NOTE — ED Triage Notes (Signed)
Pt presents with non productive cough, congestion sore throat, nasal drainage, and generalized chest discomfort with movement and coughing for past few days.

## 2020-03-17 NOTE — ED Provider Notes (Signed)
[place]    CSN: [id number] Arrival date & time: 03/17/20  1137      History   Chief Complaint Chief Complaint  Patient presents with  . Sore Throat  . Cough  . Nasal Drainage    HPI Edgar Brewer is a 60 y.o. male presenting with sore throat, cough, nasal drainage x3 days. History CHF, allergies, arthritis, ESRD on dialysis, type 2 diabetes, neuropathy, pulmonary hypertension, stroke, kidney stones, chronic venous insufficiency. States he is having left-lower chest pain with deep inspiration and coughing. Denies chest pain at rest, denies dizziness, palpitations, current L arm pain, headaches, pedal edema. Does endorse some L-arm pain 2 days ago that resolved on its own. Fully vaccinated for covid. Denies fevers/chills, n/v/d, shortness of breath,  facial pain, teeth pain, headaches,  loss of taste/smell, swollen lymph nodes, ear pain. States he is attending dialysis appts as directed, without issue.   HPI  Past Medical History:  Diagnosis Date  . Acute on chronic combined systolic and diastolic CHF, NYHA class 1 ([place]) 01/13/2015  . Allergy   . Arthritis   . Chronic kidney disease    MWF [name].  . Diabetes mellitus type 2 with complications ([place])    Previous insulin, non-insulin requiring noted 09/2018.  Complications include retinopathy, peripheral neuropathy.  Marland Kitchen GERD (gastroesophageal reflux disease)    at times  . Head injury with loss of consciousness ([place])   . History of kidney stones   . Hypertension   . Neuromuscular disorder ([place])   . Neuropathy   . Pulmonary hypertension (HCC)    PA peak pressure 41 mmHg 03/04/16 echo  . Stroke ([place])   . Varicose veins of lower extremities with complications 13/08/6576    Patient Active Problem List   Diagnosis Date Noted  . Aortic stenosis 02/25/2020  . Type 2 diabetes mellitus with diabetic neuropathy, unspecified ([place]) 02/25/2020  . History of stroke 02/25/2020  . Gallbladder abscess 11/15/2018  . Elevated  LFTs 10/18/2018  . ESRD (end stage renal disease) on dialysis ([place]) 10/03/2018  . Other disorders of phosphorus metabolism 01/19/2018  . Secondary hyperparathyroidism of renal origin ([place]) 09/27/2017  . CKD (chronic kidney disease), stage IV ([place]) 01/14/2015  . Diabetes mellitus type II, non insulin dependent ([place]) 01/14/2015  . Obesity 01/14/2015  . Hypertension associated with diabetes ([place]) 01/14/2015  . Anemia of chronic kidney failure 01/14/2015  . Chronic diastolic CHF (congestive heart failure) ([place]) 01/13/2015  . Varicose veins of lower extremities with complications 46/96/2952  . Chronic venous insufficiency 10/22/2014    Past Surgical History:  Procedure Laterality Date  . AV FISTULA PLACEMENT Left 06/01/2016   Procedure: ARTERIOVENOUS (AV) FISTULA CREATION;  Surgeon: [name], MD;  Location: [place];  Service: Vascular;  Laterality: Left;  . AV FISTULA PLACEMENT Left 08/10/2017   Procedure: BRACHIOCEPHALIC ARTERIOVENOUS (AV) FISTULA CREATION LEFT UPPER EXTREMITY;  Surgeon: [name], MD;  Location: [place];  Service: Vascular;  Laterality: Left;  . CHOLECYSTECTOMY N/A 10/04/2018   Procedure: LAPAROSCOPIC CHOLECYSTECTOMY;  Surgeon: Kinsinger, Arta Bruce, MD;  Location: [place];  Service: General;  Laterality: N/A;  . COLONOSCOPY W/ POLYPECTOMY  03/2016   For evaluation of iron deficiency anemia.  Dr. Silverio Decamp.  10 polyps, largest 20 mm.  Pathology: tubular adenomas with no high-grade dysplasia, hyperplastic.  None bleeding internal hemorrhoids.  Pandiverticulosis.  Marland Kitchen ENDOVENOUS ABLATION SAPHENOUS VEIN W/ LASER Left 07-23-2015   endovenous laser ablation left greater saphenous vein by [name]  .  ENDOVENOUS ABLATION SAPHENOUS VEIN W/ LASER Right 10/15/2015   EVLA right greater saphenous vein by [name]  . ESOPHAGOGASTRODUODENOSCOPY  03/2016   For evaluation of IDA.  Dr. Silverio Decamp.  Duodenal erythema, pathology benign mucosa, no villous atrophy.  Marland Kitchen GALLBLADDER  SURGERY  10/2018  . IR RADIOLOGIST EVAL & MGMT  11/29/2018  . TEE WITHOUT CARDIOVERSION N/A 11/19/2018   Procedure: TRANSESOPHAGEAL ECHOCARDIOGRAM (TEE);  Surgeon: Acie Fredrickson Wonda Cheng, MD;  [facility] ENDOSCOPY;  Service: Cardiovascular;  Laterality: N/A;       Home Medications    Prior to Admission medications   Medication Sig Start Date End Date Taking? Authorizing Provider  benzonatate (TESSALON) 100 MG capsule Take 1 capsule (100 mg total) by mouth every 8 (eight) hours. 03/17/20  Yes Hazel Sams, PA-C  promethazine-dextromethorphan (PROMETHAZINE-DM) 6.25-15 MG/5ML syrup Take 5 mLs by mouth 4 (four) times daily as needed for cough. 03/17/20  Yes Hazel Sams, PA-C  aspirin EC 81 MG tablet Take 81 mg by mouth daily. Swallow whole.    [provider]  b complex-vitamin c-folic acid (NEPHRO-VITE) 0.8 MG TABS tablet Take 1 tablet by mouth daily.    [provider]  cinacalcet (SENSIPAR) 60 MG tablet Take 60 mg by mouth daily.    [provider]  gabapentin (NEURONTIN) 300 MG capsule Take 300 mg by mouth See admin instructions. Take 300 mg by mouth in the morning and 300 mg at bedtime    [provider]  linagliptin (TRADJENTA) 5 MG TABS tablet Take 1 tablet (5 mg total) by mouth daily. 02/25/20   Marin Olp, MD  omeprazole (PRILOSEC) 20 MG capsule Take 20 mg by mouth daily before breakfast.  11/25/17   [provider]  rosuvastatin (CRESTOR) 10 MG tablet Take 1 tablet (10 mg total) by mouth daily. 02/27/20   Marin Olp, MD  sevelamer carbonate (RENVELA) 800 MG tablet Take 2 tablets (1,600 mg total) by mouth 3 (three) times daily with meals. 10/22/18   Aline August, MD    Family History Family History  Problem Relation Age of Onset  . Arthritis Mother   . Hearing loss Father   . Prostate cancer Father   . Hyperlipidemia Maternal Grandmother   . Heart disease Maternal Grandmother   . Lung cancer Maternal Grandfather      Social History Social History   Tobacco Use  . Smoking status: Former Smoker    Packs/day: 1.00    Years: 45.00    Pack years: 45.00    Types: Cigarettes    Quit date: 01/13/2015    Years since quitting: 5.1  . Smokeless tobacco: Never Used  Vaping Use  . Vaping Use: Never used  Substance Use Topics  . Alcohol use: Yes    Alcohol/week: 1.0 standard drink    Types: 1 Glasses of wine per week    Comment: rare occassion  . Drug use: No     Allergies   Codeine   Review of Systems Review of Systems  Constitutional: Negative for appetite change, chills and fever.  HENT: Positive for congestion and sore throat. Negative for ear pain, rhinorrhea, sinus pressure and sinus pain.   Eyes: Negative for redness and visual disturbance.  Respiratory: Positive for cough. Negative for chest tightness, shortness of breath and wheezing.   Cardiovascular: Negative for chest pain and palpitations.  Gastrointestinal: Negative for abdominal pain, constipation, diarrhea, nausea and vomiting.  Genitourinary: Negative for dysuria, frequency and urgency.  Musculoskeletal:  Negative for myalgias.  Neurological: Negative for dizziness, weakness and headaches.  Psychiatric/Behavioral: Negative for confusion.  All other systems reviewed and are negative.    Physical Exam Triage Vital Signs ED Triage Vitals  Enc Vitals Group     BP 03/17/20 1229 118/90     Pulse Rate 03/17/20 1229 87     Resp 03/17/20 1229 20     Temp 03/17/20 1229 99.3 F (37.4 C)     Temp Source 03/17/20 1229 Oral     SpO2 03/17/20 1229 97 %     Weight --      Height --      Head Circumference --      Peak Flow --      Pain Score 03/17/20 1227 5     Pain Loc --      Pain Edu? --      Excl. in [place]? --    No data found.  Updated Vital Signs BP 118/90 (BP Location: Right Arm)   Pulse 87   Temp 99.3 F (37.4 C) (Oral)   Resp 20   SpO2 97%   Visual Acuity Right Eye Distance:   Left Eye Distance:    Bilateral Distance:    Right Eye Near:   Left Eye Near:    Bilateral Near:     Physical Exam Vitals reviewed.  Constitutional:      General: He is not in acute distress.    Appearance: Normal appearance. He is not ill-appearing.  HENT:     Head: Normocephalic and atraumatic.     Right Ear: Hearing, tympanic membrane, ear canal and external ear normal. No swelling or tenderness. There is no impacted cerumen. No mastoid tenderness. Tympanic membrane is not perforated, erythematous, retracted or bulging.     Left Ear: Hearing, tympanic membrane, ear canal and external ear normal. No swelling or tenderness. There is no impacted cerumen. No mastoid tenderness. Tympanic membrane is not perforated, erythematous, retracted or bulging.     Nose:     Right Sinus: No maxillary sinus tenderness or frontal sinus tenderness.     Left Sinus: No maxillary sinus tenderness or frontal sinus tenderness.     Mouth/Throat:     Mouth: Mucous membranes are moist.     Pharynx: Uvula midline. No oropharyngeal exudate or posterior oropharyngeal erythema.     Tonsils: No tonsillar exudate.  Cardiovascular:     Rate and Rhythm: Normal rate and regular rhythm.     Pulses:          Radial pulses are 2+ on the right side and 2+ on the left side.     Heart sounds: Normal heart sounds.  Pulmonary:     Breath sounds: Normal breath sounds and air entry. No wheezing, rhonchi or rales.  Chest:     Chest wall: Tenderness present.     Comments: Left lower sternal border TTP Abdominal:     General: Abdomen is flat. Bowel sounds are normal.     Tenderness: There is no abdominal tenderness. There is no guarding or rebound.  Musculoskeletal:     Right lower leg: No edema.     Left lower leg: No edema.     Comments: L shoulder without tenderness to palpation, ROM intact and without pain. Sensation intact.   Lymphadenopathy:     Cervical: No cervical adenopathy.  Neurological:     General: No focal deficit present.      Mental Status: He is alert and oriented to  person, place, and time.  Psychiatric:        Attention and Perception: Attention and perception normal.        Mood and Affect: Mood and affect normal.        Behavior: Behavior normal. Behavior is cooperative.        Thought Content: Thought content normal.        Judgment: Judgment normal.      UC Treatments / Results  Labs (all labs ordered are listed, but only abnormal results are displayed) Labs Reviewed  SARS CORONAVIRUS 2 (TAT 6-24 HRS)    EKG   Radiology No results found.  Procedures Procedures (including critical care time)  Medications Ordered in UC Medications - No data to display  Initial Impression / Assessment and Plan / UC Course  I have reviewed the triage vital signs and the nursing notes.  Pertinent labs & imaging results that were available during my care of the patient were reviewed by me and considered in my medical decision making (see chart for details).     This patient is a 60 year old male presenting with URI symptoms. Today he is afebrile nontachycardic nontachypneic oxygenating well on room air, no wheezes rhonchi or rales. This patient has a complicated medical history including chronic CHF, ESRD, type 2 diabetes with neuropathy, stroke. Currently attending dialysis appts as directed.  Patient presenting with vague symptoms of chest pain, worse with deep inspiration. radiation of pain down left arm 2 days ago but not currently. On exam, chest pain is reproducible. EKG today NSR, unchanged from 2020 EKG. Reassurance provided. Reviewed treatment plan with attending physician Dr. Mannie Stabile.  Patient is fully vaccinated for covid19. Covid test sent. Isolation as per CDC guidelines.   Avoid NSAIDs given ESRD.   Symptomatic relief with tessalon and promethazine DM as below.  Spent over 40 minutes obtaining H&P, performing physical, discussing results, treatment plan and plan for follow-up with patient.  Patient agrees with plan.     Final Clinical Impressions(s) / UC Diagnoses   Final diagnoses:  Viral upper respiratory tract infection  Encounter for screening for COVID-19  ESRD on dialysis [facility])  History of chronic CHF  Type 2 diabetes mellitus with diabetic polyneuropathy, without long-term current use of insulin ([place])     Discharge Instructions     -Promethazine DM cough syrup for congestion/cough. This could make you drowsy, so take at night before bed. -Tessalon as needed for cough. Take one pill up to 3x daily (every 8 hours) -Tylenol for fevers/chills, rib pain, body aches. Avoid Ibuprofen due to your CKD.  Edwena Bunde sent a PCR test for covid19. This will come back in about 1 day. We'll call you if it's positive, otherwise this will go straight to MyChart and your email.  -Seek immediate medical attention if you develop chest pain at rest, pain down left arm, dizziness, swelling in your legs, headaches, new fevers/chills, abdominal pain, etc.     ED Prescriptions    Medication Sig Dispense Auth. Provider   benzonatate (TESSALON) 100 MG capsule Take 1 capsule (100 mg total) by mouth every 8 (eight) hours. 21 capsule Hazel Sams, PA-C   promethazine-dextromethorphan (PROMETHAZINE-DM) 6.25-15 MG/5ML syrup Take 5 mLs by mouth 4 (four) times daily as needed for cough. 118 mL Hazel Sams, PA-C     PDMP not reviewed this encounter.   Hazel Sams, PA-C 03/17/20 [phone]

## 2020-03-17 NOTE — Discharge Instructions (Addendum)
-  Promethazine DM cough syrup for congestion/cough. This could make you drowsy, so take at night before bed. -Tessalon as needed for cough. Take one pill up to 3x daily (every 8 hours) -Tylenol for fevers/chills, rib pain, body aches. Avoid Ibuprofen due to your CKD.  Edwena Bunde sent a PCR test for covid19. This will come back in about 1 day. We'll call you if it's positive, otherwise this will go straight to MyChart and your email.  -Seek immediate medical attention if you develop chest pain at rest, pain down left arm, dizziness, swelling in your legs, headaches, new fevers/chills, abdominal pain, etc.

## 2020-03-18 LAB — SARS CORONAVIRUS 2 (TAT 6-24 HRS): SARS Coronavirus 2: NEGATIVE

## 2020-03-23 NOTE — Patient Instructions (Addendum)
Health Maintenance Due  Topic Date Due  . OPHTHALMOLOGY EXAM Needs to sign a release form at checkout.  Never done  . COLONOSCOPY (Pts 45-47yrs Insurance coverage will need to be confirmed) Patient declined. We still strongly advocate for this 04/11/2017  . COVID-19 Vaccine (3 - Booster for Moderna series) Will call back dates.  11/02/2019   Please stop by lab and x-ray before you go If you have mychart- we will send your results within 3 business days of [place] receiving them.  If you do not have mychart- we will call you about results within 5 business days of [place] receiving them.  *please also note that you will see labs on mychart as soon as they post. I will later go in and write notes on them- will say "notes from Dr. Yong Channel"   We will call you within two weeks about your referral to cardiology. If you do not hear within 2 weeks, give [place] a call.  If you have new or worsening symptoms please seek care immediately  Trial of omeprazole 40 mg to see if that helps with symptoms-possible reflux but also want to rule out heart disease

## 2020-03-23 NOTE — Progress Notes (Signed)
Phone [phone] In person visit   Subjective:   Ayodele Sangalang is a 60 y.o. year old very pleasant male patient who presents for/with See problem oriented charting Chief Complaint  Patient presents with  . Cough    Two weeks   . Nasal Congestion    Runny nose , two weeks   . Chest Pain    In the middle of his chest     This visit occurred during the SARS-CoV-2 public health emergency.  Safety protocols were in place, including screening questions prior to the visit, additional usage of staff PPE, and extensive cleaning of exam room while observing appropriate contact time as indicated for disinfecting solutions.   Past Medical History-  Patient Active Problem List   Diagnosis Date Noted  . History of stroke 02/25/2020    Priority: High  . ESRD (end stage renal disease) on dialysis ([place]) 10/03/2018    Priority: High  . Diabetes mellitus type II, non insulin dependent ([place]) 01/14/2015    Priority: High  . Chronic diastolic CHF (congestive heart failure) ([place]) 01/13/2015    Priority: High  . GERD (gastroesophageal reflux disease) 03/25/2020    Priority: Medium  . Aortic stenosis 02/25/2020    Priority: Medium  . Type 2 diabetes mellitus with diabetic neuropathy, unspecified ([place]) 02/25/2020    Priority: Medium  . Gallbladder abscess 11/15/2018    Priority: Medium  . Elevated LFTs 10/18/2018    Priority: Medium  . Other disorders of phosphorus metabolism 01/19/2018    Priority: Medium  . Secondary hyperparathyroidism of renal origin ([place]) 09/27/2017    Priority: Medium  . Hypertension associated with diabetes ([place]) 01/14/2015    Priority: Medium  . Anemia of chronic kidney failure 01/14/2015    Priority: Medium  . Varicose veins of lower extremities with complications 82/95/6213    Priority: Medium  . CKD (chronic kidney disease), stage IV ([place]) 01/14/2015    Priority: Low  . Obesity 01/14/2015    Priority: Low  . Chronic venous insufficiency 10/22/2014     Priority: Low    Medications- reviewed and updated Current Outpatient Medications  Medication Sig Dispense Refill  . aspirin EC 81 MG tablet Take 81 mg by mouth daily. Swallow whole.    . b complex-vitamin c-folic acid (NEPHRO-VITE) 0.8 MG TABS tablet Take 1 tablet by mouth daily.    . cinacalcet (SENSIPAR) 60 MG tablet Take 60 mg by mouth daily.    Marland Kitchen gabapentin (NEURONTIN) 300 MG capsule Take 1 capsule (300 mg total) by mouth at bedtime. 90 capsule 3  . linagliptin (TRADJENTA) 5 MG TABS tablet Take 1 tablet (5 mg total) by mouth daily. 90 tablet 3  . omeprazole (PRILOSEC) 20 MG capsule Take 20 mg by mouth daily before breakfast.   2  . omeprazole (PRILOSEC) 40 MG capsule Take 1 capsule (40 mg total) by mouth daily. Hold omeprazole 20 mg while taking omeprazole 40mg  dose for 1 month 30 capsule 0  . sevelamer carbonate (RENVELA) 800 MG tablet Take 2 tablets (1,600 mg total) by mouth 3 (three) times daily with meals.     No current facility-administered medications for this visit.     Objective:  BP 122/76   Pulse 93   Temp 98.4 F (36.9 C) (Temporal)   Ht 5\' 8"  (1.727 m)   Wt 205 lb 12.8 oz (93.4 kg)   SpO2 94%   BMI 31.29 kg/m  Gen: NAD, resting comfortably CV: RRR no murmurs rubs or gallops Lungs: CTAB  no crackles, wheeze, rhonchi Abdomen: soft/nontender/nondistended/normal bowel sounds.  Ext: no edema Skin: warm, dry     Assessment and Plan  #Cough and Congestion #Chest pain S: cough and congestion started  2-3 weeks ago. Was seen in urgent care on 03/17/20. Also complaining of some chest pain at that time. Cough and congestion have prettty much resolved but still having lower central chest pain. Has also been belching a lot more. Worse after eating meals and also bothers him after dialysis. Not worse before bed. No fever, chills. No shortness of breath. No left arm or neck pain along with chest pain. Pain at moment very mild- this morning was moderate. Slightly worse if  bends down and picks something up. Not sure if worse with flight of stairs or walking.   Does see Dr. [name] for CHF and aortic stenosis.  A/P: 60 year old male with cough/congestion for 2 to 3 weeks and also developed chest pain during that time.  Cough and congestion have essentially resolved.  chest pain has continued -Possible flareup of GERD--seems to be worse after meals and at dialysis.  Increase omeprazole to 40 mg for 1 month then may resume 20 mg -Patient also with some crackles at bilateral bases right greater than left-he is due for dialysis today but I am also going to get a chest x-ray to evaluate for potential pneumonia.   -Patient with multiple risk factors for cardiac disease and when I review her EKG even though it is read as no significant change from last tracing-appears to have some flattening into 3 and aVF for T waves to me-refer back to cardiology.  #Heart failure -follows with Dr. [name] S: Medication: lasix in past, now managed by dialysis   Edema: No significant increase compared to predialysis prior to his symptoms Weight gain:no Shortness of breath: no significant increase A/P: Appears stable-also has dialysis later today-does have crackles but that will likely be managed by dialysis  #hypertension S: medication: controlled without meds A/P:  Stable. Continue current medications.     #history of stroke- now on aspirin 81mg - stutter lingers from this #hyperlipidemia S: Medication: Due to severe elevations we attempted to start statin but patient seems to think this caused the upper respiratory infection and decided to stop Lab Results  Component Value Date   CHOL 212 (H) 02/25/2020   HDL 38.40 (L) 02/25/2020   LDLDIRECT 64.0 02/25/2020   TRIG (H) 02/25/2020    433.0 Triglyceride is over 400; calculations on Lipids are invalid.   CHOLHDL 6 02/25/2020    A/P: Lipids are very elevated and reversed triglycerides -he wants to remain off medicine for now-LFTs  have improved at last visit but does have significant history of elevation-consider consulting with GI before restarting  Recommended follow up: keep June visit or sooner if needed- start with cardiology consult  Future Appointments  Date Time Provider [place]  06/25/2020 10:40 AM Marin Olp, MD [facility]   Lab/Order associations:   ICD-10-CM   1. Chest pain, unspecified type  R07.9 Ambulatory referral to Cardiology    DG Chest 2 View  2. Chronic diastolic CHF (congestive heart failure) (HCC)  I50.32   3. Hypertension associated with diabetes ([place])  E11.59    I15.2   4. Gastroesophageal reflux disease, unspecified whether esophagitis present  K21.9     Meds ordered this encounter  Medications  . omeprazole (PRILOSEC) 40 MG capsule    Sig: Take 1 capsule (40 mg total) by mouth daily. Hold omeprazole  20 mg while taking omeprazole 40mg  dose for 1 month    Dispense:  30 capsule    Refill:  0  . gabapentin (NEURONTIN) 300 MG capsule    Sig: Take 1 capsule (300 mg total) by mouth at bedtime.    Dispense:  90 capsule    Refill:  3  gabapentin should be limited to 300mg  instead of BID- we reduced this with refill due to neuropathy  Return precautions advised.  Garret Reddish, MD

## 2020-03-25 ENCOUNTER — Other Ambulatory Visit: Payer: Self-pay

## 2020-03-25 ENCOUNTER — Ambulatory Visit (INDEPENDENT_AMBULATORY_CARE_PROVIDER_SITE_OTHER): Payer: Medicare Other

## 2020-03-25 ENCOUNTER — Telehealth: Payer: Self-pay

## 2020-03-25 ENCOUNTER — Encounter: Payer: Self-pay | Admitting: Family Medicine

## 2020-03-25 ENCOUNTER — Ambulatory Visit (INDEPENDENT_AMBULATORY_CARE_PROVIDER_SITE_OTHER): Payer: Medicare Other | Admitting: Family Medicine

## 2020-03-25 VITALS — BP 122/76 | HR 93 | Temp 98.4°F | Ht 68.0 in | Wt 205.8 lb

## 2020-03-25 DIAGNOSIS — R079 Chest pain, unspecified: Secondary | ICD-10-CM

## 2020-03-25 DIAGNOSIS — K219 Gastro-esophageal reflux disease without esophagitis: Secondary | ICD-10-CM | POA: Diagnosis not present

## 2020-03-25 DIAGNOSIS — I152 Hypertension secondary to endocrine disorders: Secondary | ICD-10-CM

## 2020-03-25 DIAGNOSIS — E1140 Type 2 diabetes mellitus with diabetic neuropathy, unspecified: Secondary | ICD-10-CM

## 2020-03-25 DIAGNOSIS — E1159 Type 2 diabetes mellitus with other circulatory complications: Secondary | ICD-10-CM | POA: Diagnosis not present

## 2020-03-25 DIAGNOSIS — I5032 Chronic diastolic (congestive) heart failure: Secondary | ICD-10-CM

## 2020-03-25 MED ORDER — OMEPRAZOLE 40 MG PO CPDR: 40.0000 mg | DELAYED_RELEASE_CAPSULE | Freq: Every day | ORAL | 0 refills | Status: DC

## 2020-03-25 MED ORDER — GABAPENTIN 300 MG PO CAPS: 300.0000 mg | ORAL_CAPSULE | Freq: Every day | ORAL | 3 refills | Status: DC

## 2020-03-25 NOTE — Telephone Encounter (Signed)
Patient receive, Edgar Brewer first shot was 04/03/19, then second shot was on 05/03/19, and then the booster was on 01/23/20

## 2020-03-25 NOTE — Telephone Encounter (Signed)
Shots updated

## 2020-03-27 ENCOUNTER — Telehealth: Payer: Self-pay

## 2020-03-27 ENCOUNTER — Other Ambulatory Visit: Payer: Self-pay

## 2020-03-27 DIAGNOSIS — J189 Pneumonia, unspecified organism: Secondary | ICD-10-CM

## 2020-03-27 MED ORDER — DOXYCYCLINE HYCLATE 100 MG PO TABS: 100.0000 mg | ORAL_TABLET | Freq: Two times a day (BID) | ORAL | 0 refills | Status: AC

## 2020-03-27 NOTE — Telephone Encounter (Signed)
Rx sent to local pharmacy and future chest xray ordered.

## 2020-03-27 NOTE — Telephone Encounter (Signed)
Patient's mother called in stating the medication for his pneumonia has not been sent in yet.

## 2020-04-20 ENCOUNTER — Telehealth: Payer: Self-pay

## 2020-04-20 NOTE — Telephone Encounter (Signed)
atient Name: Edgar Brewer Gender: Male DOB: 1960-11-08 Age: 60 Y 42 M 2 D Return Phone Number: [phone] (Primary) Address: City/State/Zip: [place] [address] Client  [facility] [place] Day Physician Garret Reddish- MD Contact Type Call Who Is Calling Patient / Member / Family / Caregiver Call Type Triage / Clinical Caller Name Edgar Brewer Relationship To Patient Mother Return Phone Number [phone] (Primary) Chief Complaint Dizziness Reason for Call Symptomatic / Request for [place] states her son is cold all of the time, not acting like himself and dizzy/uncoordinated. He had pneumonia last week. Translation No No Triage Reason Other Nurse Assessment Nurse: Zenia Resides, RN, Diane Date/Time Eilene Ghazi Time): 04/20/2020 1:39:54 PM Confirm and document reason for call. If symptomatic, describe symptoms. ---Caller states her son is cold all of the time, not acting like himself and dizzy/uncoordinated. He had pneumonia last week. Caller is not with pt. States pt. is not breathing right and has some brain fog. Does the patient have any new or worsening symptoms? ---Yes Will a triage be completed? ---No [place] reason for no triage. ---Other Disp. Time Eilene Ghazi Time) Disposition Final User 04/20/2020 1:42:09 PM Clinical Call Yes Zenia Resides, RN, Diane Comments User: Hildred Priest, RN Date/Time Eilene Ghazi Time): 04/20/2020 1:41:57 PM Caller is not with pt. Pt. is in dialysis right now. Mom will call us back when he gets home or if still dizzy, etc. will take to E

## 2020-04-20 NOTE — Telephone Encounter (Signed)
Please Advise

## 2020-04-20 NOTE — Telephone Encounter (Signed)
I agree with "Mom will call us back when he gets home or if still dizzy, etc. will take to E" but I assume E means emergency room. Would be great for dialysis physician to evaluate him while he is already being seen  He was treated for pneumonia at beginning of the month- was he again treated later by dialysis?

## 2020-04-21 ENCOUNTER — Other Ambulatory Visit: Payer: Self-pay

## 2020-04-21 ENCOUNTER — Other Ambulatory Visit: Payer: Medicare Other

## 2020-04-21 ENCOUNTER — Ambulatory Visit (INDEPENDENT_AMBULATORY_CARE_PROVIDER_SITE_OTHER): Payer: Medicare Other

## 2020-04-21 DIAGNOSIS — J189 Pneumonia, unspecified organism: Secondary | ICD-10-CM

## 2020-04-22 ENCOUNTER — Telehealth: Payer: Self-pay

## 2020-04-22 ENCOUNTER — Other Ambulatory Visit: Payer: Self-pay | Admitting: Family Medicine

## 2020-04-22 ENCOUNTER — Other Ambulatory Visit: Payer: Self-pay

## 2020-04-22 DIAGNOSIS — J189 Pneumonia, unspecified organism: Secondary | ICD-10-CM

## 2020-04-22 MED ORDER — AZITHROMYCIN 250 MG PO TABS: ORAL_TABLET | ORAL | 0 refills | Status: DC

## 2020-04-22 MED ORDER — AMOXICILLIN-POT CLAVULANATE 500-125 MG PO TABS: 1.0000 | ORAL_TABLET | Freq: Every day | ORAL | 0 refills | Status: DC

## 2020-04-22 NOTE — Telephone Encounter (Signed)
Patient called in check on his xray report.

## 2020-04-22 NOTE — Telephone Encounter (Signed)
I will comment on result note

## 2020-04-22 NOTE — Telephone Encounter (Signed)
Report is in but no comments from you.

## 2020-05-05 ENCOUNTER — Encounter: Payer: Self-pay | Admitting: Family Medicine

## 2020-05-05 ENCOUNTER — Other Ambulatory Visit: Payer: Self-pay

## 2020-05-05 ENCOUNTER — Telehealth (INDEPENDENT_AMBULATORY_CARE_PROVIDER_SITE_OTHER): Payer: Medicare Other | Admitting: Family Medicine

## 2020-05-05 ENCOUNTER — Other Ambulatory Visit: Payer: Medicare Other

## 2020-05-05 ENCOUNTER — Telehealth: Payer: Self-pay

## 2020-05-05 VITALS — BP 154/77 | HR 92 | Ht 68.0 in

## 2020-05-05 DIAGNOSIS — E1159 Type 2 diabetes mellitus with other circulatory complications: Secondary | ICD-10-CM | POA: Diagnosis not present

## 2020-05-05 DIAGNOSIS — R140 Abdominal distension (gaseous): Secondary | ICD-10-CM

## 2020-05-05 DIAGNOSIS — I152 Hypertension secondary to endocrine disorders: Secondary | ICD-10-CM | POA: Diagnosis not present

## 2020-05-05 LAB — COMPREHENSIVE METABOLIC PANEL
ALT: 14 U/L (ref 0–53)
AST: 18 U/L (ref 0–37)
Albumin: 3.5 g/dL (ref 3.5–5.2)
Alkaline Phosphatase: 433 U/L — ABNORMAL HIGH (ref 39–117)
BUN: 19 mg/dL (ref 6–23)
CO2: 32 mEq/L (ref 19–32)
Calcium: 8.8 mg/dL (ref 8.4–10.5)
Chloride: 98 mEq/L (ref 96–112)
Creatinine, Ser: 4.78 mg/dL (ref 0.40–1.50)
GFR: 12.48 mL/min — CL (ref >60.00)
Glucose, Bld: 158 mg/dL — ABNORMAL HIGH (ref 70–99)
Potassium: 4.4 mEq/L (ref 3.5–5.1)
Sodium: 139 mEq/L (ref 135–145)
Total Bilirubin: 0.6 mg/dL (ref 0.2–1.2)
Total Protein: 6.6 g/dL (ref 6.0–8.3)

## 2020-05-05 LAB — CBC WITH DIFFERENTIAL/PLATELET
Basophils Absolute: 0.1 10*3/uL (ref 0.0–0.1)
Basophils Relative: 0.9 % (ref 0.0–3.0)
Eosinophils Absolute: 0.1 10*3/uL (ref 0.0–0.7)
Eosinophils Relative: 1.7 % (ref 0.0–5.0)
HCT: 32.4 % — ABNORMAL LOW (ref 39.0–52.0)
Hemoglobin: 10.6 g/dL — ABNORMAL LOW (ref 13.0–17.0)
Lymphocytes Relative: 12.3 % (ref 12.0–46.0)
Lymphs Abs: 0.8 10*3/uL (ref 0.7–4.0)
MCHC: 32.6 g/dL (ref 30.0–36.0)
MCV: 83.5 fl (ref 78.0–100.0)
Monocytes Absolute: 0.4 10*3/uL (ref 0.1–1.0)
Monocytes Relative: 6 % (ref 3.0–12.0)
Neutro Abs: 4.9 10*3/uL (ref 1.4–7.7)
Neutrophils Relative %: 79.1 % — ABNORMAL HIGH (ref 43.0–77.0)
Platelets: 255 10*3/uL (ref 150.0–400.0)
RBC: 3.88 Mil/uL — ABNORMAL LOW (ref 4.22–5.81)
RDW: 16.5 % — ABNORMAL HIGH (ref 11.5–15.5)
WBC: 6.2 10*3/uL (ref 4.0–10.5)

## 2020-05-05 LAB — LIPASE: Lipase: 22 U/L (ref 11.0–59.0)

## 2020-05-05 NOTE — Telephone Encounter (Signed)
CRITICAL VALUE STICKER  CRITICAL VALUE:Creatinine 4.78, GFR 12.48  RECEIVER (on-site recipient of call):Yana Schorr  DATE & TIME NOTIFIED: 05/05/20 4:20pm  MESSENGER (representative from lab):Flonnie Overman  MD NOTIFIED: Doran Heater  TIME OF NOTIFICATION:4:25pm  RESPONSE:

## 2020-05-05 NOTE — Telephone Encounter (Signed)
Dr Jerline Pain has handled this

## 2020-05-05 NOTE — Patient Instructions (Addendum)
Health Maintenance Due  Topic Date Due  . FOOT EXAM may be done at next in office visit.  Never done  . OPHTHALMOLOGY EXAM 05/14/2020 Never done  . COLONOSCOPY (Pts 45-51yrs Insurance coverage will need to be confirmed)  Please discuss.  04/11/2017    Depression screen PHQ 2/9 02/25/2020  Decreased Interest 0  Down, Depressed, Hopeless 0  PHQ - 2 Score 0    Recommended follow up: No follow-ups on file.

## 2020-05-05 NOTE — Addendum Note (Signed)
Addended by: Brandy Hale on: 05/05/2020 01:54 PM   Modules accepted: Orders

## 2020-05-05 NOTE — Progress Notes (Signed)
Phone [phone] Virtual visit via phonenote   Subjective:   Chief Complaint  Patient presents with  . Breathing Problem    1-2 weeks   . Abdominal Pain    Patient states that It feels tight.    This visit type was conducted due to national recommendations for restrictions regarding the COVID-19 Pandemic (e.g. social distancing).  This format is felt to be most appropriate for this patient at this time balancing risks to patient and risks to population by having him in for in person visit.  All issues noted in this document were discussed and addressed.  No physical exam was performed (except for noted visual exam or audio findings with Telehealth visits).  The patient has consented to conduct a Telehealth visit and understands insurance will be billed.   Our team/I connected with [name] at 11:20 AM EDT by phone (patient did not have equipment for webex) and verified that I am speaking with the correct person using two identifiers.  Location patient: [facility] Location provider: [facility], office Persons participating in the virtual visit:  Patient, mother  Time on phone: 17 minutes  Our team/I discussed the limitations of evaluation and management by telemedicine and the availability of in person appointments. In light of current covid-19 pandemic, patient also understands that we are trying to protect them by minimizing in office contact if at all possible.  The patient expressed consent for telemedicine visit and agreed to proceed. Patient understands insurance will be billed.   Past Medical History-  Patient Active Problem List   Diagnosis Date Noted  . History of stroke 02/25/2020    Priority: High  . ESRD (end stage renal disease) on dialysis ([place]) 10/03/2018    Priority: High  . Diabetes mellitus type II, non insulin dependent ([place]) 01/14/2015    Priority: High  . Chronic diastolic CHF (congestive heart failure) ([place]) 01/13/2015    Priority: High  . GERD  (gastroesophageal reflux disease) 03/25/2020    Priority: Medium  . Aortic stenosis 02/25/2020    Priority: Medium  . Type 2 diabetes mellitus with diabetic neuropathy, unspecified ([place]) 02/25/2020    Priority: Medium  . Gallbladder abscess 11/15/2018    Priority: Medium  . Elevated LFTs 10/18/2018    Priority: Medium  . Other disorders of phosphorus metabolism 01/19/2018    Priority: Medium  . Secondary hyperparathyroidism of renal origin ([place]) 09/27/2017    Priority: Medium  . Hypertension associated with diabetes ([place]) 01/14/2015    Priority: Medium  . Anemia of chronic kidney failure 01/14/2015    Priority: Medium  . Varicose veins of lower extremities with complications 62/69/4854    Priority: Medium  . CKD (chronic kidney disease), stage IV ([place]) 01/14/2015    Priority: Low  . Obesity 01/14/2015    Priority: Low  . Chronic venous insufficiency 10/22/2014    Priority: Low    Medications- reviewed and updated Current Outpatient Medications  Medication Sig Dispense Refill  . amoxicillin-clavulanate (AUGMENTIN) 500-125 MG tablet Take 1 tablet (500 mg total) by mouth daily. After dialysis on dialysis days 10 tablet 0  . aspirin EC 81 MG tablet Take 81 mg by mouth daily. Swallow whole.    Marland Kitchen azithromycin (ZITHROMAX) 250 MG tablet Take 2 tabs on day 1, then 1 tab daily until finished 6 tablet 0  . b complex-vitamin c-folic acid (NEPHRO-VITE) 0.8 MG TABS tablet Take 1 tablet by mouth daily.    . cinacalcet (SENSIPAR) 60 MG tablet Take 60 mg by  mouth daily.    Marland Kitchen gabapentin (NEURONTIN) 300 MG capsule Take 1 capsule (300 mg total) by mouth at bedtime. 90 capsule 3  . linagliptin (TRADJENTA) 5 MG TABS tablet Take 1 tablet (5 mg total) by mouth daily. 90 tablet 3  . omeprazole (PRILOSEC) 20 MG capsule Take 20 mg by mouth daily before breakfast.   2  . omeprazole (PRILOSEC) 40 MG capsule Take 1 capsule (40 mg total) by mouth daily. Hold omeprazole 20 mg while taking omeprazole 40mg  dose  for 1 month 30 capsule 0  . sevelamer carbonate (RENVELA) 800 MG tablet Take 2 tablets (1,600 mg total) by mouth 3 (three) times daily with meals.     No current facility-administered medications for this visit.     Objective:  BP (!) 154/77   Pulse 92   Ht 5\' 8"  (1.727 m)   BMI 31.29 kg/m  self reported vitals  Nonlabored voice, normal speech      Assessment and Plan   # abdominal fullness/early satiety/shortness of breath (being treated for pneumonia) S:patient was cutting hedges at house yesterday and felt firmness in his abdomen. He also feels short of breath at the same time. Weight has not increased recently- still getting dialysis MWF. Worse with bending over. Normal size and consistency stools. No chest pain.   Symptoms started 1-2 weeks ago. He is still undr treatment for pneumonia from x-ray on 04/21/20- has not finished augmentin that he takes after dialysis. Did finish azithromycin  When he eats he feels full rather quickly even if eats small amount  A/P: patient complains of SOB but has not even yet finished antibiotics for pneumonia-  I think some of SOB could be from pneumonia. Also antibiotics could be causing some bloating/abdominal fullness. Recommended he stop by for labs cbc, cmp, lipase especially with elevated LFT history. Recommended in person follow up in 2 weeks or sooner should seek care if new or worsening symptoms. Already on PPI- doubt reflux is cause. Could have some diabetic gastroparesis as well thought doubt would have acutely come on.  -I do not think this is fluid overload with weight down and regular monitoring by dialysis center - no chest pain- consider EKG/further workup if were to develop- would need immediate eval if so.  - will have to call back to schedule labs -already planned CXR 1 month out from treatment  Recommended follow up: 2 weeks recommended Future Appointments  Date Time Provider [place]  06/25/2020 10:40 AM Marin Olp, MD [facility]  08/11/2020  2:00 PM Josue Hector, MD [facility]    Lab/Order associations:   ICD-10-CM   1. Hypertension associated with diabetes ([place])  E11.59 CBC with Differential/Platelet   I15.2 Comprehensive metabolic panel  2. Bloating  R14.0 Lipase    Return precautions advised.  Garret Reddish, MD

## 2020-05-07 ENCOUNTER — Ambulatory Visit (INDEPENDENT_AMBULATORY_CARE_PROVIDER_SITE_OTHER): Payer: Medicare Other | Admitting: Internal Medicine

## 2020-05-07 ENCOUNTER — Other Ambulatory Visit: Payer: Self-pay

## 2020-05-07 ENCOUNTER — Encounter: Payer: Self-pay | Admitting: Internal Medicine

## 2020-05-07 VITALS — BP 132/80 | HR 98 | Temp 98.5°F | Resp 18 | Ht 68.0 in | Wt 206.2 lb

## 2020-05-07 DIAGNOSIS — S81802A Unspecified open wound, left lower leg, initial encounter: Secondary | ICD-10-CM | POA: Diagnosis not present

## 2020-05-07 DIAGNOSIS — Z992 Dependence on renal dialysis: Secondary | ICD-10-CM

## 2020-05-07 DIAGNOSIS — E1159 Type 2 diabetes mellitus with other circulatory complications: Secondary | ICD-10-CM

## 2020-05-07 DIAGNOSIS — N186 End stage renal disease: Secondary | ICD-10-CM | POA: Diagnosis not present

## 2020-05-07 DIAGNOSIS — E1140 Type 2 diabetes mellitus with diabetic neuropathy, unspecified: Secondary | ICD-10-CM

## 2020-05-07 DIAGNOSIS — I152 Hypertension secondary to endocrine disorders: Secondary | ICD-10-CM

## 2020-05-07 DIAGNOSIS — L03116 Cellulitis of left lower limb: Secondary | ICD-10-CM | POA: Diagnosis not present

## 2020-05-07 MED ORDER — CLINDAMYCIN HCL 300 MG PO CAPS: 300.0000 mg | ORAL_CAPSULE | Freq: Four times a day (QID) | ORAL | 0 refills | Status: DC

## 2020-05-07 MED ORDER — CEFTRIAXONE SODIUM 1 G IJ SOLR
1.0000 g | Freq: Once | INTRAMUSCULAR | Status: AC
  Administered 2020-05-07: 1 g via INTRAMUSCULAR

## 2020-05-07 NOTE — Progress Notes (Signed)
Patient ID: Edgar Brewer, male   DOB: 12/26/1960, 60 y.o.   MRN: [id number]        Chief Complaint: left leg wound, now with worsening redness and swelling       HPI:  Edgar Brewer is a 60 y.o. male here after was playing with a puppy maybe 5 days ago; pt was wearing long pants but recalls swiped with paws at the back of the distal LLE and didn't think much of it at the time.  Has neuropathy with very low sensation to distal LEs chronically.  Even now has little to no pain, but noticed an area of lost layer of skin and slight bleeding to the distal posterior LLE, then 2 days ago started with redness and swelling rapidly worsening it seems now involving at the whole distal half of the LLE, somewhat the prox foot as well.  No drainage, red streaks, high fever or chills but figured he was gettting an infection.  Pt denies chest pain, increased sob or doe, wheezing, orthopnea, PND, increased LE swelling, palpitations, dizziness or syncope.   Pt denies polydipsia, polyuria,  Denies new focal neuro s/s.      Already takes augmentin three times weekly post HD.   Wt Readings from Last 3 Encounters:  05/07/20 206 lb 3.2 oz (93.5 kg)  03/25/20 205 lb 12.8 oz (93.4 kg)  02/25/20 211 lb 9.6 oz (96 kg)   BP Readings from Last 3 Encounters:  05/07/20 132/80  05/05/20 (!) 154/77  03/25/20 122/76         Past Medical History:  Diagnosis Date  . Acute on chronic combined systolic and diastolic CHF, NYHA class 1 ([place]) 01/13/2015  . Allergy   . Arthritis   . Chronic kidney disease    MWF [name].  . Diabetes mellitus type 2 with complications ([place])    Previous insulin, non-insulin requiring noted 09/2018.  Complications include retinopathy, peripheral neuropathy.  Marland Kitchen GERD (gastroesophageal reflux disease)    at times  . Head injury with loss of consciousness ([place])   . History of kidney stones   . Hypertension   . Neuromuscular disorder ([place])   . Neuropathy   . Pulmonary hypertension (H[facility])    PA peak  pressure 41 mmHg 03/04/16 echo  . Stroke ([place])   . Varicose veins of lower extremities with complications 83/41/9622   Past Surgical History:  Procedure Laterality Date  . AV FISTULA PLACEMENT Left 06/01/2016   Procedure: ARTERIOVENOUS (AV) FISTULA CREATION;  Surgeon: [name], MD;  Location: [place];  Service: Vascular;  Laterality: Left;  . AV FISTULA PLACEMENT Left 08/10/2017   Procedure: BRACHIOCEPHALIC ARTERIOVENOUS (AV) FISTULA CREATION LEFT UPPER EXTREMITY;  Surgeon: [name], MD;  Location: [place];  Service: Vascular;  Laterality: Left;  . CHOLECYSTECTOMY N/A 10/04/2018   Procedure: LAPAROSCOPIC CHOLECYSTECTOMY;  Surgeon: Kinsinger, Arta Bruce, MD;  Location: [place];  Service: General;  Laterality: N/A;  . COLONOSCOPY W/ POLYPECTOMY  03/2016   For evaluation of iron deficiency anemia.  Dr. Silverio Decamp.  10 polyps, largest 20 mm.  Pathology: tubular adenomas with no high-grade dysplasia, hyperplastic.  None bleeding internal hemorrhoids.  Pandiverticulosis.  Marland Kitchen ENDOVENOUS ABLATION SAPHENOUS VEIN W/ LASER Left 07-23-2015   endovenous laser ablation left greater saphenous vein by [name]  . ENDOVENOUS ABLATION SAPHENOUS VEIN W/ LASER Right 10/15/2015   EVLA right greater saphenous vein by [name]  . ESOPHAGOGASTRODUODENOSCOPY  03/2016   For evaluation of IDA.  Dr. Silverio Decamp.  Duodenal erythema, pathology benign mucosa, no villous atrophy.  Marland Kitchen GALLBLADDER SURGERY  10/2018  . IR RADIOLOGIST EVAL & MGMT  11/29/2018  . TEE WITHOUT CARDIOVERSION N/A 11/19/2018   Procedure: TRANSESOPHAGEAL ECHOCARDIOGRAM (TEE);  Surgeon: Acie Fredrickson Wonda Cheng, MD;  [facility] ENDOSCOPY;  Service: Cardiovascular;  Laterality: N/A;    reports that he quit smoking about 5 years ago. His smoking use included cigarettes. He has a 45.00 pack-year smoking history. He has never used smokeless tobacco. He reports current alcohol use of about 1.0 standard drink of alcohol per week. He reports that he does  not use drugs. family history includes Arthritis in his mother; Hearing loss in his father; Heart disease in his maternal grandmother; Hyperlipidemia in his maternal grandmother; Lung cancer in his maternal grandfather; Prostate cancer in his father. Allergies  Allergen Reactions  . Codeine Anaphylaxis, Hives, Swelling and Other (See Comments)    Swelling all over body and the throat   Current Outpatient Medications on File Prior to Visit  Medication Sig Dispense Refill  . amoxicillin-clavulanate (AUGMENTIN) 500-125 MG tablet Take 1 tablet (500 mg total) by mouth daily. After dialysis on dialysis days 10 tablet 0  . aspirin EC 81 MG tablet Take 81 mg by mouth daily. Swallow whole.    . b complex-vitamin c-folic acid (NEPHRO-VITE) 0.8 MG TABS tablet Take 1 tablet by mouth daily.    . cinacalcet (SENSIPAR) 60 MG tablet Take 60 mg by mouth daily.    Marland Kitchen gabapentin (NEURONTIN) 300 MG capsule Take 1 capsule (300 mg total) by mouth at bedtime. 90 capsule 3  . linagliptin (TRADJENTA) 5 MG TABS tablet Take 1 tablet (5 mg total) by mouth daily. 90 tablet 3  . omeprazole (PRILOSEC) 20 MG capsule Take 20 mg by mouth daily before breakfast.   2  . omeprazole (PRILOSEC) 40 MG capsule Take 1 capsule (40 mg total) by mouth daily. Hold omeprazole 20 mg while taking omeprazole 40mg  dose for 1 month 30 capsule 0  . sevelamer carbonate (RENVELA) 800 MG tablet Take 2 tablets (1,600 mg total) by mouth 3 (three) times daily with meals.     No current facility-administered medications on file prior to visit.        ROS:  All others reviewed and negative.  Objective        PE:  BP 132/80   Pulse 98   Temp 98.5 F (36.9 C) (Oral)   Resp 18   Ht 5\' 8"  (1.727 m)   Wt 206 lb 3.2 oz (93.5 kg)   SpO2 92%   BMI 31.35 kg/m                 Constitutional: Pt appears in NAD               HENT: Head: NCAT.                Right Ear: External ear normal.                 Left Ear: External ear normal.                 Eyes: . Pupils are equal, round, and reactive to light. Conjunctivae and EOM are normal               Nose: without d/c or deformity               Neck: Neck supple. Gross normal ROM  Cardiovascular: Normal rate and regular rhythm.                 Pulmonary/Chest: Effort normal and breath sounds without rales or wheezing.                Abd:  Soft, NT, ND, + BS, no organomegaly               Neurological: Pt is alert. At baseline orientation, motor grossly intact               Skin: distal LLE with 2-3+ redness, swelling and slight tender distal half LLE and ankle but mostly insensate due to neuropathy, o/w neurovasc intact ; worse aspect appears to be the posterior aspet with 2 x 2 cm area loss of superficial skin without active bleeding or abscess or drainage               Psychiatric: Pt behavior is normal without agitation   Micro: none  Cardiac tracings I have personally interpreted today:  none  Pertinent Radiological findings (summarize): none   Lab Results  Component Value Date   WBC 6.2 05/05/2020   HGB 10.6 (L) 05/05/2020   HCT 32.4 (L) 05/05/2020   PLT 255.0 05/05/2020   GLUCOSE 158 (H) 05/05/2020   CHOL 212 (H) 02/25/2020   TRIG (H) 02/25/2020    433.0 Triglyceride is over 400; calculations on Lipids are invalid.   HDL 38.40 (L) 02/25/2020   LDLDIRECT 64.0 02/25/2020   ALT 14 05/05/2020   AST 18 05/05/2020   NA 139 05/05/2020   K 4.4 05/05/2020   CL 98 05/05/2020   CREATININE 4.78 (HH) 05/05/2020   BUN 19 05/05/2020   CO2 32 05/05/2020   TSH 1.256 01/13/2015   INR 1.0 11/14/2018   HGBA1C 8.2 (H) 02/25/2020   Assessment/Plan:  Edgar Brewer is a 60 y.o. White or Caucasian [1] male with  has a past medical history of Acute on chronic combined systolic and diastolic CHF, NYHA class 1 ([place]) (01/13/2015), Allergy, Arthritis, Chronic kidney disease, Diabetes mellitus type 2 with complications ([place]), GERD (gastroesophageal reflux disease), Head injury with  loss of consciousness ([place]), History of kidney stones, Hypertension, Neuromuscular disorder ([place]), Neuropathy, Pulmonary hypertension ([place]), Stroke ([place]), and Varicose veins of lower extremities with complications (22/48/2500).  Left leg cellulitis  Mod and likely high risk for worsening and hospn, now for rocphin 1 gm IM, now, and clindamyin course,  to f/u any worsening with presenting to ED  Wound of left leg Also for wound clinic referral given DM with neuropathy  ESRD (end stage renal disease) on dialysis [facility]) To continue HD, but hold the augmentin while taking the cleocin  Hypertension associated with diabetes ([place]) BP Readings from Last 3 Encounters:  05/07/20 132/80  05/05/20 (!) 154/77  03/25/20 122/76   Stable, pt to continue medical treatment - none   Type 2 diabetes mellitus with diabetic neuropathy, unspecified ([place]) Lab Results  Component Value Date   HGBA1C 8.2 (H) 02/25/2020   Fair control only rcently, pt to continue current medical treatment tradjenta and f/u pcp  Current Outpatient Medications (Endocrine & Metabolic):  .  cinacalcet (SENSIPAR) 60 MG tablet, Take 60 mg by mouth daily. Marland Kitchen  linagliptin (TRADJENTA) 5 MG TABS tablet, Take 1 tablet (5 mg total) by mouth daily.    Current Outpatient Medications (Analgesics):  .  aspirin EC 81 MG tablet, Take 81 mg by mouth daily. Swallow whole.   Current Outpatient Medications (  Other):  .  amoxicillin-clavulanate (AUGMENTIN) 500-125 MG tablet, Take 1 tablet (500 mg total) by mouth daily. After dialysis on dialysis days .  b complex-vitamin c-folic acid (NEPHRO-VITE) 0.8 MG TABS tablet, Take 1 tablet by mouth daily. .  clindamycin (CLEOCIN) 300 MG capsule, Take 1 capsule (300 mg total) by mouth 4 (four) times daily. Marland Kitchen  gabapentin (NEURONTIN) 300 MG capsule, Take 1 capsule (300 mg total) by mouth at bedtime. Marland Kitchen  omeprazole (PRILOSEC) 20 MG capsule, Take 20 mg by mouth daily before breakfast.  .  omeprazole  (PRILOSEC) 40 MG capsule, Take 1 capsule (40 mg total) by mouth daily. Hold omeprazole 20 mg while taking omeprazole 40mg  dose for 1 month .  sevelamer carbonate (RENVELA) 800 MG tablet, Take 2 tablets (1,600 mg total) by mouth 3 (three) times daily with meals.   Followup: Return if symptoms worsen or fail to improve.  Cathlean Cower, MD 05/08/2020 1:05 PM [facility]

## 2020-05-07 NOTE — Patient Instructions (Signed)
You had the antibiotic shot today (rocephin)  OK to HOLD on taking the augmentin (the antibiotic you take with dialysis) when you are taking the new antibiotic  Please take all new medication as prescribed -  The new antibiotic - Clindamycin for 10 days  You will be contacted regarding the referral for: Wound Clinic  Please change the dressing every day, and go to the ED for any swelling, redness or pain that is getting worse  Please continue all other medications as before, and refills have been done if requested.  Please have the pharmacy call with any other refills you may need.  Please keep your appointments with your specialists as you may have planned

## 2020-05-08 ENCOUNTER — Encounter: Payer: Self-pay | Admitting: Internal Medicine

## 2020-05-08 DIAGNOSIS — S81802A Unspecified open wound, left lower leg, initial encounter: Secondary | ICD-10-CM | POA: Insufficient documentation

## 2020-05-08 DIAGNOSIS — L03116 Cellulitis of left lower limb: Secondary | ICD-10-CM | POA: Insufficient documentation

## 2020-05-08 NOTE — Assessment & Plan Note (Signed)
Also for wound clinic referral given DM with neuropathy

## 2020-05-08 NOTE — Assessment & Plan Note (Signed)
To continue HD, but hold the augmentin while taking the cleocin

## 2020-05-08 NOTE — Assessment & Plan Note (Addendum)
Mod and likely high risk for worsening and hospn, now for rocphin 1 gm IM, now, and clindamyin course,  to f/u any worsening with presenting to ED

## 2020-05-08 NOTE — Assessment & Plan Note (Signed)
BP Readings from Last 3 Encounters:  05/07/20 132/80  05/05/20 (!) 154/77  03/25/20 122/76   Stable, pt to continue medical treatment - none

## 2020-05-08 NOTE — Assessment & Plan Note (Signed)
Lab Results  Component Value Date   HGBA1C 8.2 (H) 02/25/2020   Fair control only rcently, pt to continue current medical treatment tradjenta and f/u pcp  Current Outpatient Medications (Endocrine & Metabolic):  .  cinacalcet (SENSIPAR) 60 MG tablet, Take 60 mg by mouth daily. Marland Kitchen  linagliptin (TRADJENTA) 5 MG TABS tablet, Take 1 tablet (5 mg total) by mouth daily.    Current Outpatient Medications (Analgesics):  .  aspirin EC 81 MG tablet, Take 81 mg by mouth daily. Swallow whole.   Current Outpatient Medications (Other):  .  amoxicillin-clavulanate (AUGMENTIN) 500-125 MG tablet, Take 1 tablet (500 mg total) by mouth daily. After dialysis on dialysis days .  b complex-vitamin c-folic acid (NEPHRO-VITE) 0.8 MG TABS tablet, Take 1 tablet by mouth daily. .  clindamycin (CLEOCIN) 300 MG capsule, Take 1 capsule (300 mg total) by mouth 4 (four) times daily. Marland Kitchen  gabapentin (NEURONTIN) 300 MG capsule, Take 1 capsule (300 mg total) by mouth at bedtime. Marland Kitchen  omeprazole (PRILOSEC) 20 MG capsule, Take 20 mg by mouth daily before breakfast.  .  omeprazole (PRILOSEC) 40 MG capsule, Take 1 capsule (40 mg total) by mouth daily. Hold omeprazole 20 mg while taking omeprazole 40mg  dose for 1 month .  sevelamer carbonate (RENVELA) 800 MG tablet, Take 2 tablets (1,600 mg total) by mouth 3 (three) times daily with meals.

## 2020-05-09 ENCOUNTER — Telehealth: Payer: Self-pay | Admitting: Family Medicine

## 2020-05-09 DIAGNOSIS — L03119 Cellulitis of unspecified part of limb: Secondary | ICD-10-CM

## 2020-05-09 MED ORDER — AMOXICILLIN-POT CLAVULANATE 500-125 MG PO TABS: 1.0000 | ORAL_TABLET | Freq: Every day | ORAL | 0 refills | Status: DC

## 2020-05-09 NOTE — Telephone Encounter (Signed)
  Contacted by call service.  Pt's mother states patient given clindamycin 4 times daily for cellulitis of extremity, however has violent emesis after each dose times the last 2 days.  No issues after Rocephin injection in clinic.  Patient able to keep food and liquids down.  Notes area of cellulitis improving.  Redness has not spread and there is no streaking noted.  Per chart review patient was on Augmentin daily after dialysis for treatment of pneumonia at the end of March?  Per pt's mother he "finished the augmentin and is only taking the abx he takes after dialysis".  Allergies reviewed-codeine.  Gven difficulty obtaining clarification to various questions, will renally dose Augmentin.  Patient to take Augmentin 500-125 mg daily.  For any worsening symptoms or continued emesis patient to proceed to nearest ED for further evaluation.

## 2020-05-11 ENCOUNTER — Telehealth: Payer: Self-pay

## 2020-05-11 ENCOUNTER — Other Ambulatory Visit: Payer: Self-pay

## 2020-05-11 DIAGNOSIS — L03119 Cellulitis of unspecified part of limb: Secondary | ICD-10-CM

## 2020-05-11 MED ORDER — AMOXICILLIN-POT CLAVULANATE 500-125 MG PO TABS: 1.0000 | ORAL_TABLET | Freq: Every day | ORAL | 0 refills | Status: DC

## 2020-05-11 MED ORDER — DOXYCYCLINE HYCLATE 100 MG PO TABS: 100.0000 mg | ORAL_TABLET | Freq: Two times a day (BID) | ORAL | 0 refills | Status: DC

## 2020-05-11 NOTE — Telephone Encounter (Signed)
Stop clindamycin on medication list-list as allergy in place vomiting as reason  Start doxycycline 100 mg twice daily for 7 days  Have him restart the Augmentin until finished

## 2020-05-11 NOTE — Telephone Encounter (Signed)
Spoke with patient's daughter. She is aware and agreeable of Dr hunters comments

## 2020-05-11 NOTE — Telephone Encounter (Signed)
Nurse Assessment Nurse: Gloriann Loan RN, Sharyn Lull Date/Time (Eastern Time): 05/09/2020 9:28:09 AM Confirm and document reason for call. If symptomatic, describe symptoms. ---Caller states her son has a sore on his leg and the medication he is on is making him vomit. Started it yesterday, it is clindamycin. He is supposed to take this 4 times per day. Was recently on amoxicillin for PNA but they changed him to this for the leg wound. Does the patient have any new or worsening symptoms? ---Yes Will a triage be completed? ---Yes Related visit to physician within the last 2 weeks? ---Yes Does the PT have any chronic conditions? (i.e. diabetes, asthma, this includes High risk factors for pregnancy, etc.) ---Yes List chronic conditions. ---Dialysis, diabetes Is this a behavioral health or substance abuse call? ---No Guidelines Guideline Title Affirmed Question Affirmed Notes Nurse Date/Time Eilene Ghazi Time) Cellulitis on Antibiotic Follow-up Call [1] Caller has NONURGENT question AND [2] triager Gloriann Loan, RN, Sharyn Lull 05/09/2020 9:32:11 AM PLEASE NOTE: All timestamps contained within this report are represented as [place] Standard Time. CONFIDENTIALTY NOTICE: This fax transmission is intended only for the addressee. It contains information that is legally privileged, confidential or otherwise protected from use or disclosure. If you are not the intended recipient, you are strictly prohibited from reviewing, disclosing, copying using or disseminating any of this information or taking any action in reliance on or regarding this information. If you have received this fax in error, please notify us immediately by telephone so that we can arrange for its return to [place]. Phone: [phone], Toll-Free: [phone], Fax: [phone] Page: 2 of 2 Call Id: [id number] Guidelines Guideline Title Affirmed Question Affirmed Notes Nurse Date/Time Eilene Ghazi Time) unable to answer question Disp. Time  Eilene Ghazi Time) Disposition Final User 05/09/2020 9:36:08 AM Paged On Call back to [facility], [place], Sharyn Lull 05/09/2020 9:34:42 AM Call PCP within 24 Hours Yes Gloriann Loan, RN, Julio Sicks Disagree/Comply Comply Caller Understands Yes PreDisposition Call Doctor Care Advice Given Per Guideline CALL PCP WITHIN 24 HOURS: * You need to discuss this with your doctor (or NP/PA) within the next 24 hours. * IF OFFICE WILL BE CLOSED: I'll page the on-call provider now. EXCEPTION: from 9 pm to 9 am. Since this isn't urgent, we'll hold the page until morning. CONTINUE ANTIBIOTIC: * Continue the prescribed antibiotic. * Also, continue any other treatment instructions from your doctor (or NP/PA). CALL BACK IF: * Fever over 100.4 F (38.0 C) * You become worse CARE ADVICE given per Cellulitis on Antibiotic Follow-Up Call (Adult) guideline. Comments User: Leodis Sias, RN Date/Time Eilene Ghazi Time): 05/09/2020 9:37:51 AM Uses [facility] on [address] [phone]. Allergic to codeine and possibly this clindamycin. Referrals GO TO FACILITY OTHER - SPECIFY Paging DoctorName Phone DateTime Result/ Outcome Message Type Notes Grier Mitts- MD [phone] 05/09/2020 9:36:08 AM Called On Call Provider - Left Message Doctor Paged Grier Mitts- MD 05/09/2020 10:08:08 AM Spoke with On Call - General Message Result Advised on call of the problem with the medication. After much investigation and clarification with caller, new script is being sent. Caller aware

## 2020-05-11 NOTE — Telephone Encounter (Signed)
Spoke with the patient's mother she gave a verbal understanding.

## 2020-05-11 NOTE — Telephone Encounter (Signed)
Please advise 

## 2020-05-17 ENCOUNTER — Emergency Department (HOSPITAL_BASED_OUTPATIENT_CLINIC_OR_DEPARTMENT_OTHER): Payer: Medicare Other

## 2020-05-17 ENCOUNTER — Inpatient Hospital Stay (HOSPITAL_BASED_OUTPATIENT_CLINIC_OR_DEPARTMENT_OTHER)
Admission: EM | Admit: 2020-05-17 | Discharge: 2020-05-21 | DRG: 291 | Disposition: A | Discharge status: Home / Self Care | Payer: Medicare Other | Attending: Internal Medicine | Admitting: Internal Medicine

## 2020-05-17 ENCOUNTER — Other Ambulatory Visit: Payer: Self-pay

## 2020-05-17 ENCOUNTER — Encounter (HOSPITAL_BASED_OUTPATIENT_CLINIC_OR_DEPARTMENT_OTHER): Payer: Self-pay | Admitting: Obstetrics and Gynecology

## 2020-05-17 DIAGNOSIS — N186 End stage renal disease: Secondary | ICD-10-CM

## 2020-05-17 DIAGNOSIS — Z885 Allergy status to narcotic agent status: Secondary | ICD-10-CM

## 2020-05-17 DIAGNOSIS — E1142 Type 2 diabetes mellitus with diabetic polyneuropathy: Secondary | ICD-10-CM | POA: Diagnosis not present

## 2020-05-17 DIAGNOSIS — E876 Hypokalemia: Secondary | ICD-10-CM | POA: Diagnosis present

## 2020-05-17 DIAGNOSIS — K219 Gastro-esophageal reflux disease without esophagitis: Secondary | ICD-10-CM | POA: Diagnosis present

## 2020-05-17 DIAGNOSIS — E1122 Type 2 diabetes mellitus with diabetic chronic kidney disease: Secondary | ICD-10-CM | POA: Diagnosis present

## 2020-05-17 DIAGNOSIS — R531 Weakness: Secondary | ICD-10-CM | POA: Diagnosis present

## 2020-05-17 DIAGNOSIS — Z881 Allergy status to other antibiotic agents status: Secondary | ICD-10-CM | POA: Diagnosis not present

## 2020-05-17 DIAGNOSIS — Z992 Dependence on renal dialysis: Secondary | ICD-10-CM

## 2020-05-17 DIAGNOSIS — Z20822 Contact with and (suspected) exposure to covid-19: Secondary | ICD-10-CM | POA: Diagnosis present

## 2020-05-17 DIAGNOSIS — J9601 Acute respiratory failure with hypoxia: Secondary | ICD-10-CM | POA: Diagnosis present

## 2020-05-17 DIAGNOSIS — N2581 Secondary hyperparathyroidism of renal origin: Secondary | ICD-10-CM | POA: Diagnosis present

## 2020-05-17 DIAGNOSIS — Z8673 Personal history of transient ischemic attack (TIA), and cerebral infarction without residual deficits: Secondary | ICD-10-CM | POA: Diagnosis not present

## 2020-05-17 DIAGNOSIS — I272 Pulmonary hypertension, unspecified: Secondary | ICD-10-CM | POA: Diagnosis not present

## 2020-05-17 DIAGNOSIS — Z87891 Personal history of nicotine dependence: Secondary | ICD-10-CM

## 2020-05-17 DIAGNOSIS — Z83438 Family history of other disorder of lipoprotein metabolism and other lipidemia: Secondary | ICD-10-CM | POA: Diagnosis not present

## 2020-05-17 DIAGNOSIS — I5033 Acute on chronic diastolic (congestive) heart failure: Secondary | ICD-10-CM | POA: Diagnosis present

## 2020-05-17 DIAGNOSIS — Z8261 Family history of arthritis: Secondary | ICD-10-CM

## 2020-05-17 DIAGNOSIS — I350 Nonrheumatic aortic (valve) stenosis: Secondary | ICD-10-CM | POA: Diagnosis not present

## 2020-05-17 DIAGNOSIS — E119 Type 2 diabetes mellitus without complications: Secondary | ICD-10-CM | POA: Diagnosis not present

## 2020-05-17 DIAGNOSIS — I132 Hypertensive heart and chronic kidney disease with heart failure and with stage 5 chronic kidney disease, or end stage renal disease: Principal | ICD-10-CM | POA: Diagnosis present

## 2020-05-17 DIAGNOSIS — Z7982 Long term (current) use of aspirin: Secondary | ICD-10-CM

## 2020-05-17 DIAGNOSIS — Z79899 Other long term (current) drug therapy: Secondary | ICD-10-CM | POA: Diagnosis not present

## 2020-05-17 DIAGNOSIS — L03116 Cellulitis of left lower limb: Secondary | ICD-10-CM | POA: Diagnosis present

## 2020-05-17 DIAGNOSIS — Z801 Family history of malignant neoplasm of trachea, bronchus and lung: Secondary | ICD-10-CM

## 2020-05-17 DIAGNOSIS — Z8249 Family history of ischemic heart disease and other diseases of the circulatory system: Secondary | ICD-10-CM | POA: Diagnosis not present

## 2020-05-17 DIAGNOSIS — D631 Anemia in chronic kidney disease: Secondary | ICD-10-CM | POA: Diagnosis present

## 2020-05-17 DIAGNOSIS — Z8042 Family history of malignant neoplasm of prostate: Secondary | ICD-10-CM | POA: Diagnosis not present

## 2020-05-17 DIAGNOSIS — E8770 Fluid overload, unspecified: Secondary | ICD-10-CM | POA: Diagnosis present

## 2020-05-17 DIAGNOSIS — E118 Type 2 diabetes mellitus with unspecified complications: Secondary | ICD-10-CM

## 2020-05-17 LAB — CBC WITH DIFFERENTIAL/PLATELET
Abs Immature Granulocytes: 0.04 10*3/uL (ref 0.00–0.07)
Basophils Absolute: 0.1 10*3/uL (ref 0.0–0.1)
Basophils Relative: 1 %
Eosinophils Absolute: 0.2 10*3/uL (ref 0.0–0.5)
Eosinophils Relative: 2 %
HCT: 31.5 % — ABNORMAL LOW (ref 39.0–52.0)
Hemoglobin: 9.9 g/dL — ABNORMAL LOW (ref 13.0–17.0)
Immature Granulocytes: 1 %
Lymphocytes Relative: 9 %
Lymphs Abs: 0.6 10*3/uL — ABNORMAL LOW (ref 0.7–4.0)
MCH: 27.3 pg (ref 26.0–34.0)
MCHC: 31.4 g/dL (ref 30.0–36.0)
MCV: 86.8 fL (ref 80.0–100.0)
Monocytes Absolute: 0.5 10*3/uL (ref 0.1–1.0)
Monocytes Relative: 7 %
Neutro Abs: 5.9 10*3/uL (ref 1.7–7.7)
Neutrophils Relative %: 80 %
Platelets: 266 10*3/uL (ref 150–400)
RBC: 3.63 MIL/uL — ABNORMAL LOW (ref 4.22–5.81)
RDW: 16.1 % — ABNORMAL HIGH (ref 11.5–15.5)
WBC: 7.3 10*3/uL (ref 4.0–10.5)
nRBC: 0 % (ref 0.0–0.2)

## 2020-05-17 LAB — COMPREHENSIVE METABOLIC PANEL
ALT: 17 U/L (ref 0–44)
AST: 19 U/L (ref 15–41)
Albumin: 3.6 g/dL (ref 3.5–5.0)
Alkaline Phosphatase: 408 U/L — ABNORMAL HIGH (ref 38–126)
Anion gap: 10 (ref 5–15)
BUN: 31 mg/dL — ABNORMAL HIGH (ref 6–20)
CO2: 30 mmol/L (ref 22–32)
Calcium: 8.7 mg/dL — ABNORMAL LOW (ref 8.9–10.3)
Chloride: 96 mmol/L — ABNORMAL LOW (ref 98–111)
Creatinine, Ser: 5.44 mg/dL — ABNORMAL HIGH (ref 0.61–1.24)
GFR, Estimated: 11 mL/min — ABNORMAL LOW (ref >60)
Glucose, Bld: 191 mg/dL — ABNORMAL HIGH (ref 70–99)
Potassium: 3.9 mmol/L (ref 3.5–5.1)
Sodium: 136 mmol/L (ref 135–145)
Total Bilirubin: 0.6 mg/dL (ref 0.3–1.2)
Total Protein: 6.6 g/dL (ref 6.5–8.1)

## 2020-05-17 LAB — BRAIN NATRIURETIC PEPTIDE: B Natriuretic Peptide: 944.5 pg/mL — ABNORMAL HIGH (ref 0.0–100.0)

## 2020-05-17 LAB — TROPONIN I (HIGH SENSITIVITY): Troponin I (High Sensitivity): 18 ng/L — ABNORMAL HIGH (ref <18)

## 2020-05-17 LAB — GLUCOSE, CAPILLARY: Glucose-Capillary: 247 mg/dL — ABNORMAL HIGH (ref 70–99)

## 2020-05-17 LAB — LIPASE, BLOOD: Lipase: 28 U/L (ref 11–51)

## 2020-05-17 MED ORDER — GABAPENTIN 300 MG PO CAPS
300.0000 mg | ORAL_CAPSULE | Freq: Every day | ORAL | Status: DC
  Administered 2020-05-18 – 2020-05-20 (×4): 300 mg via ORAL
  Filled 2020-05-17 (×4): qty 1

## 2020-05-17 MED ORDER — SEVELAMER CARBONATE 800 MG PO TABS
1600.0000 mg | ORAL_TABLET | Freq: Three times a day (TID) | ORAL | Status: DC
  Administered 2020-05-18 – 2020-05-21 (×9): 1600 mg via ORAL
  Filled 2020-05-17 (×10): qty 2

## 2020-05-17 MED ORDER — FUROSEMIDE 10 MG/ML IJ SOLN
80.0000 mg | Freq: Once | INTRAMUSCULAR | Status: AC
  Administered 2020-05-18: 80 mg via INTRAVENOUS
  Filled 2020-05-17: qty 8

## 2020-05-17 MED ORDER — CINACALCET HCL 30 MG PO TABS
60.0000 mg | ORAL_TABLET | Freq: Every day | ORAL | Status: DC
  Filled 2020-05-17: qty 2

## 2020-05-17 MED ORDER — ASPIRIN EC 81 MG PO TBEC
81.0000 mg | DELAYED_RELEASE_TABLET | Freq: Every day | ORAL | Status: DC
  Administered 2020-05-18 – 2020-05-21 (×4): 81 mg via ORAL
  Filled 2020-05-17 (×4): qty 1

## 2020-05-17 MED ORDER — SODIUM CHLORIDE 0.9 % IV SOLN
1.0000 g | INTRAVENOUS | Status: DC
  Administered 2020-05-18 – 2020-05-21 (×4): 1 g via INTRAVENOUS
  Filled 2020-05-17 (×4): qty 10

## 2020-05-17 MED ORDER — INSULIN ASPART 100 UNIT/ML ~~LOC~~ SOLN
0.0000 [IU] | Freq: Three times a day (TID) | SUBCUTANEOUS | Status: DC
  Administered 2020-05-18 – 2020-05-21 (×6): 1 [IU] via SUBCUTANEOUS

## 2020-05-17 MED ORDER — PANTOPRAZOLE SODIUM 40 MG PO TBEC
40.0000 mg | DELAYED_RELEASE_TABLET | Freq: Every day | ORAL | Status: DC
  Administered 2020-05-18 – 2020-05-21 (×4): 40 mg via ORAL
  Filled 2020-05-17 (×4): qty 1

## 2020-05-17 MED ORDER — LINAGLIPTIN 5 MG PO TABS
5.0000 mg | ORAL_TABLET | Freq: Every day | ORAL | Status: DC
  Administered 2020-05-18 – 2020-05-21 (×4): 5 mg via ORAL
  Filled 2020-05-17 (×4): qty 1

## 2020-05-17 MED ORDER — ACETAMINOPHEN 650 MG RE SUPP: 650.0000 mg | Freq: Four times a day (QID) | RECTAL | Status: DC | PRN

## 2020-05-17 MED ORDER — ONDANSETRON HCL 4 MG PO TABS: 4.0000 mg | ORAL_TABLET | Freq: Four times a day (QID) | ORAL | Status: DC | PRN

## 2020-05-17 MED ORDER — HEPARIN SODIUM (PORCINE) 5000 UNIT/ML IJ SOLN
5000.0000 [IU] | Freq: Three times a day (TID) | INTRAMUSCULAR | Status: DC
  Administered 2020-05-18 – 2020-05-21 (×9): 5000 [IU] via SUBCUTANEOUS
  Filled 2020-05-17 (×8): qty 1

## 2020-05-17 MED ORDER — SENNOSIDES-DOCUSATE SODIUM 8.6-50 MG PO TABS: 1.0000 | ORAL_TABLET | Freq: Every evening | ORAL | Status: DC | PRN

## 2020-05-17 MED ORDER — ACETAMINOPHEN 325 MG PO TABS: 650.0000 mg | ORAL_TABLET | Freq: Four times a day (QID) | ORAL | Status: DC | PRN

## 2020-05-17 MED ORDER — ONDANSETRON HCL 4 MG/2ML IJ SOLN: 4.0000 mg | Freq: Four times a day (QID) | INTRAMUSCULAR | Status: DC | PRN

## 2020-05-17 NOTE — H&P (Signed)
History and Physical    Edgar Brewer JGG:[id number] DOB: 13-Jan-1961 DOA: 05/17/2020  PCP: Marin Olp, MD   Patient coming from: Home   Chief Complaint: SOB, left leg redness and swelling   HPI: Edgar Brewer is a 60 y.o. male with medical history significant for ESRD on hemodialysis, chronic diastolic CHF, type 2 diabetes mellitus, and chronic normocytic anemia, now presenting to the emergency department with shortness of breath and left lower leg swelling and redness.  Patient reports that he had a superficial scratch from his dog on the posteromedial aspect of his lower left leg approximately 2 weeks ago with subsequent redness and swelling.  He has been treated with doxycycline and Augmentin and while the wound appears to have healed, he has persistent redness and swelling of the lower left leg.  He has also developed shortness of breath over the past couple days, has not noted any fevers or chills, has not had any chest pain, and has not had much cough.  He has not noticed any orthopnea.  He had an episode of vomiting yesterday morning.  Reports that he completed his dialysis session on 05/15/2020, reports strict adherence with his fluid restrictions, but does not pay attention to salt intake.  [facility] ED Course: Upon arrival to the ED, patient is found to be afebrile, saturating upper 80s on room air, mildly tachypneic, and with stable blood pressure.  Chest x-ray notable for mild increase in small left pleural effusion and left retrocardiac atelectasis or consolidation.  Left lower extremity venous Dopplers are negative for DVT but notable for edema throughout the calf and mildly enlarged groin lymph node.  Chemistry panel features a normal potassium, normal bicarbonate, BUN 31, creatinine 5.44, glucose under 91, and alkaline phosphatase 408.  CBC notable for a hemoglobin of 9.9.  High-sensitivity troponin was 18 and BNP 945.  Patient was transferred to [facility] for  ongoing evaluation and management.  Review of Systems:  All other systems reviewed and apart from HPI, are negative.  Past Medical History:  Diagnosis Date  . Acute on chronic combined systolic and diastolic CHF, NYHA class 1 ([place]) 01/13/2015  . Allergy   . Arthritis   . Chronic kidney disease    MWF [name].  . Diabetes mellitus type 2 with complications ([place])    Previous insulin, non-insulin requiring noted 09/2018.  Complications include retinopathy, peripheral neuropathy.  Marland Kitchen GERD (gastroesophageal reflux disease)    at times  . Head injury with loss of consciousness ([place])   . History of kidney stones   . Hypertension   . Neuromuscular disorder ([place])   . Neuropathy   . Pulmonary hypertension ([facility])    PA peak pressure 41 mmHg 03/04/16 echo  . Stroke ([place])   . Varicose veins of lower extremities with complications 54/65/0354    Past Surgical History:  Procedure Laterality Date  . AV FISTULA PLACEMENT Left 06/01/2016   Procedure: ARTERIOVENOUS (AV) FISTULA CREATION;  Surgeon: [name], MD;  Location: [place];  Service: Vascular;  Laterality: Left;  . AV FISTULA PLACEMENT Left 08/10/2017   Procedure: BRACHIOCEPHALIC ARTERIOVENOUS (AV) FISTULA CREATION LEFT UPPER EXTREMITY;  Surgeon: [name], MD;  Location: [place];  Service: Vascular;  Laterality: Left;  . CHOLECYSTECTOMY N/A 10/04/2018   Procedure: LAPAROSCOPIC CHOLECYSTECTOMY;  Surgeon: Kinsinger, Arta Bruce, MD;  Location: [place];  Service: General;  Laterality: N/A;  . COLONOSCOPY W/ POLYPECTOMY  03/2016   For evaluation of iron deficiency anemia.  Dr. Silverio Decamp.  10 polyps, largest 20 mm.  Pathology: tubular adenomas with no high-grade dysplasia, hyperplastic.  None bleeding internal hemorrhoids.  Pandiverticulosis.  Marland Kitchen ENDOVENOUS ABLATION SAPHENOUS VEIN W/ LASER Left 07-23-2015   endovenous laser ablation left greater saphenous vein by [name]  . ENDOVENOUS ABLATION SAPHENOUS VEIN W/ LASER Right 10/15/2015    EVLA right greater saphenous vein by [name]  . ESOPHAGOGASTRODUODENOSCOPY  03/2016   For evaluation of IDA.  Dr. Silverio Decamp.  Duodenal erythema, pathology benign mucosa, no villous atrophy.  Marland Kitchen GALLBLADDER SURGERY  10/2018  . IR RADIOLOGIST EVAL & MGMT  11/29/2018  . TEE WITHOUT CARDIOVERSION N/A 11/19/2018   Procedure: TRANSESOPHAGEAL ECHOCARDIOGRAM (TEE);  Surgeon: Acie Fredrickson Wonda Cheng, MD;  Location: [facility];  Service: Cardiovascular;  Laterality: N/A;    Social History:   reports that he quit smoking about 5 years ago. His smoking use included cigarettes. He has a 45.00 pack-year smoking history. He has never used smokeless tobacco. He reports current alcohol use of about 1.0 standard drink of alcohol per week. He reports that he does not use drugs.  Allergies  Allergen Reactions  . Codeine Anaphylaxis, Hives, Swelling and Other (See Comments)    Swelling all over body and the throat  . Clindamycin/Lincomycin Nausea And Vomiting    Family History  Problem Relation Age of Onset  . Arthritis Mother   . Hearing loss Father   . Prostate cancer Father   . Hyperlipidemia Maternal Grandmother   . Heart disease Maternal Grandmother   . Lung cancer Maternal Grandfather      Prior to Admission medications   Medication Sig Start Date End Date Taking? Authorizing Provider  amoxicillin-clavulanate (AUGMENTIN) 500-125 MG tablet Take 1 tablet (500 mg total) by mouth daily. 05/11/20   Marin Olp, MD  aspirin EC 81 MG tablet Take 81 mg by mouth daily. Swallow whole.    [provider]  b complex-vitamin c-folic acid (NEPHRO-VITE) 0.8 MG TABS tablet Take 1 tablet by mouth daily.    [provider]  cinacalcet (SENSIPAR) 60 MG tablet Take 60 mg by mouth daily.    [provider]  doxycycline (VIBRA-TABS) 100 MG tablet Take 1 tablet (100 mg total) by mouth 2 (two) times daily. 05/11/20   Marin Olp, MD  gabapentin (NEURONTIN) 300 MG capsule Take 1  capsule (300 mg total) by mouth at bedtime. 03/25/20   Marin Olp, MD  linagliptin (TRADJENTA) 5 MG TABS tablet Take 1 tablet (5 mg total) by mouth daily. 02/25/20   Marin Olp, MD  omeprazole (PRILOSEC) 20 MG capsule Take 20 mg by mouth daily before breakfast.  11/25/17   [provider]  omeprazole (PRILOSEC) 40 MG capsule Take 1 capsule (40 mg total) by mouth daily. Hold omeprazole 20 mg while taking omeprazole 40mg  dose for 1 month 03/25/20   Marin Olp, MD  sevelamer carbonate (RENVELA) 800 MG tablet Take 2 tablets (1,600 mg total) by mouth 3 (three) times daily with meals. 10/22/18   Aline August, MD    Physical Exam: Vitals:   05/17/20 1636 05/17/20 1804 05/17/20 2008 05/17/20 2115  BP:  140/74  (!) 152/76  Pulse: 95 92 95 95  Resp:  (!) 31 (!) 27 (!) 26  Temp:    99.4 F (37.4 C)  TempSrc:    Oral  SpO2: 93% 94% (!) 87% 100%  Weight:    92.9 kg  Height:    5\' 8"  (  1.727 m)    Constitutional: NAD, calm  Eyes: PERTLA, lids and conjunctivae normal ENMT: Mucous membranes are moist. Posterior pharynx clear of any exudate or lesions.   Neck:  supple, no masses  Respiratory: no wheezing, no crackles. No accessory muscle use.  Cardiovascular: S1 & S2 heard, regular rate and rhythm. Trace pitting edema on right, 3+ on left. Abdomen: No distension, no tenderness, soft. Bowel sounds active.  Musculoskeletal: no clubbing / cyanosis. No joint deformity upper and lower extremities.   Skin: Left calf erythema, edema, and heat. Dry, warm, well-perfused. Neurologic: CN 2-12 grossly intact. Sensation intact. Moving all extremities.  Psychiatric: Alert and oriented to person, place, and situation. Calm and cooperative.    Labs and Imaging on Admission: I have personally reviewed following labs and imaging studies  CBC: Recent Labs  Lab 05/17/20 1400  WBC 7.3  NEUTROABS 5.9  HGB 9.9*  HCT 31.5*  MCV 86.8  PLT 078   Basic Metabolic Panel: Recent Labs  Lab  05/17/20 1400  NA 136  K 3.9  CL 96*  CO2 30  GLUCOSE 191*  BUN 31*  CREATININE 5.44*  CALCIUM 8.7*   GFR: Estimated Creatinine Clearance: 16 mL/min (A) (by C-G formula based on SCr of 5.44 mg/dL (H)). Liver Function Tests: Recent Labs  Lab 05/17/20 1400  AST 19  ALT 17  ALKPHOS 408*  BILITOT 0.6  PROT 6.6  ALBUMIN 3.6   Recent Labs  Lab 05/17/20 1400  LIPASE 28   No results for input(s): AMMONIA in the last 168 hours. Coagulation Profile: No results for input(s): INR, PROTIME in the last 168 hours. Cardiac Enzymes: No results for input(s): CKTOTAL, CKMB, CKMBINDEX, TROPONINI in the last 168 hours. BNP (last 3 results) No results for input(s): PROBNP in the last 8760 hours. HbA1C: No results for input(s): HGBA1C in the last 72 hours. CBG: Recent Labs  Lab 05/17/20 2117  GLUCAP 247*   Lipid Profile: No results for input(s): CHOL, HDL, LDLCALC, TRIG, CHOLHDL, LDLDIRECT in the last 72 hours. Thyroid Function Tests: No results for input(s): TSH, T4TOTAL, FREET4, T3FREE, THYROIDAB in the last 72 hours. Anemia Panel: No results for input(s): VITAMINB12, FOLATE, FERRITIN, TIBC, IRON, RETICCTPCT in the last 72 hours. Urine analysis:    Component Value Date/Time   COLORURINE AMBER (A) 11/18/2018 0807   APPEARANCEUR CLEAR 11/18/2018 0807   LABSPEC 1.015 11/18/2018 0807   PHURINE 6.0 11/18/2018 0807   GLUCOSEU >=500 (A) 11/18/2018 0807   HGBUR NEGATIVE 11/18/2018 0807   BILIRUBINUR SMALL (A) 11/18/2018 0807   KETONESUR 5 (A) 11/18/2018 0807   PROTEINUR >=300 (A) 11/18/2018 0807   NITRITE NEGATIVE 11/18/2018 0807   LEUKOCYTESUR NEGATIVE 11/18/2018 0807   Sepsis Labs: @LABRCNTIP (procalcitonin:4,lacticidven:4) )No results found for this or any previous visit (from the past 240 hour(s)).   Radiological Exams on Admission: US Venous Img Lower  Left (DVT Study)  Result Date: 05/17/2020 CLINICAL DATA:  Left lower calf dog scratch 2-3 weeks ago, now swollen and  erythematous. EXAM: LEFT LOWER EXTREMITY VENOUS DOPPLER ULTRASOUND TECHNIQUE: Gray-scale sonography with compression, as well as color and duplex ultrasound, were performed to evaluate the deep venous system(s) from the level of the common femoral vein through the popliteal and proximal calf veins. COMPARISON:  None. FINDINGS: VENOUS Normal compressibility of the common femoral, superficial femoral, and popliteal veins, as well as the visualized calf veins. The peroneal vein is not well seen. Visualized portions of profunda femoral vein and great saphenous vein unremarkable. No  filling defects to suggest DVT on grayscale or color Doppler imaging. Doppler waveforms show normal direction of venous flow, normal respiratory plasticity and response to augmentation. Limited views of the contralateral common femoral vein are unremarkable. OTHER Multiple varicosities are noted in the leg without evidence of clot. There is edema throughout the calf. A lymph node in the left groin has a fatty hilum, but appears mildly enlarged. Limitations: none IMPRESSION: 1. No evidence of deep venous thrombosis within the left lower extremity. 2. Edema throughout the calf and mildly enlarged left groin lymph node. These findings likely reflect infection given the clinical history. Electronically Signed   By: Zerita Boers M.D.   On: 05/17/2020 16:10   DG Chest Port 1 View  Result Date: 05/17/2020 CLINICAL DATA:  Shortness of breath. EXAM: PORTABLE CHEST 1 VIEW COMPARISON:  04/21/2020 FINDINGS: Heart size is stable. Small left pleural effusion shows mild increase in size since previous study. Atelectasis or consolidation is seen in the left retrocardiac lung base. Right lung remains clear. IMPRESSION: Mild increase in size of small left pleural effusion. Left retrocardiac atelectasis versus consolidation. Electronically Signed   By: Marlaine Hind M.D.   On: 05/17/2020 14:34    Assessment/Plan   1. Acute hypoxic respiratory failure;  acute on chronic diastolic CHF  - Presents with SOB and persistent LLE redness and swelling despite oral antibiotics and is found to have oxygen saturations in upper 80s on rm air  - Small left pleural effusion and question of retrocardiac atelectasis or consolidation on 1v CXR   - BNP increased to 945, EF was preserved in October 2020  - Check 2v CXR, give Lasix 80 mg IV x1, consult nephrology in am, continue supplemental O2 as needed   2. Left leg cellulitis  - Lower left leg swollen, red, and warm despite doxycycline and Augmentin as outpatient  - Venous US negative for DVT  - Start Rocephin, monitor    3. ESRD  - Reports completing HD on 05/15/20  - Hypervolemic but in any distress  - Restrict fluids, renally-dose medications, consult nephrology in am    4. Type II DM  - A1c was 8.2% in February 2022  - Continue Tradjenta, check CBGs and use SSI for now     DVT prophylaxis: sq heparin  Code Status: Full  Level of Care: Level of care: Telemetry Medical Family Communication: None present  Disposition Plan:  Patient is from: Home  Anticipated d/c is to: home  Anticipated d/c date is: 4/25 or 05/19/20 Patient currently: Pending  Consults called: None  Admission status: Observation     Vianne Bulls, MD [facility]  05/17/2020, 10:58 PM

## 2020-05-17 NOTE — ED Provider Notes (Signed)
  Physical Exam  BP 140/74   Pulse 92   Temp 98.1 F (36.7 C) (Oral)   Resp (!) 31   Ht 5\' 8"  (1.727 m)   Wt 94 kg   SpO2 94%   BMI 31.51 kg/m   Physical Exam  ED Course/Procedures   Clinical Course as of 05/17/20 2005  Sun May 17, 2020  1829 I spoke to [facility] about transfer, and they accepted. [AW]    Clinical Course User Index [AW] Arnaldo Natal, MD    Procedures  MDM  Mr. Critzer had desaturation to 88% with ambulation.  He was also noted to be tachypneic to 31 even at rest.  He is likely symptomatic from his increasing pleural effusion and will be admitted to the hospital for further management.      Arnaldo Natal, MD 05/17/20 2005

## 2020-05-17 NOTE — ED Notes (Signed)
Patient's O2 sats have been 87-89% at times with movement. During ambulation with RT and NT he was around 90% but still has episodes of de-sat

## 2020-05-17 NOTE — Plan of Care (Signed)

## 2020-05-17 NOTE — ED Notes (Signed)
Patient ambulated per order. O2 sats remained 90-93% while ambulating on RA. HR maintained between 95-100 throughout. Patient denies any shob at this time.

## 2020-05-17 NOTE — ED Notes (Signed)
Patient reports he has Dialysis on MWF

## 2020-05-17 NOTE — Progress Notes (Signed)
MD at bedside assessing pt. Will intiate new orders.

## 2020-05-17 NOTE — ED Triage Notes (Signed)
Patient reports x2 weeks ago he was scratched by his sister's dog and has since developed cellulitis.  Patient reports he has diabetes that is not well managed. Patient also reports he has a hernia in his lower abdomen that is making him feel short of breath. Patient reports he has regular emesis.

## 2020-05-17 NOTE — Progress Notes (Signed)
Pt admitted from OSH via mobile. vss on 3l [place]. No acute distress noted. Call bell with in reach. Bed alarm on. See charted assessments.

## 2020-05-17 NOTE — ED Provider Notes (Signed)
[facility] EMERGENCY DEPT Provider Note   CSN: [id number] Arrival date & time: 05/17/20  1318     History Chief Complaint  Patient presents with  . Cellulitis    Edgar Brewer is a 60 y.o. male.  Patient is a 59 year old male with a history of pulmonary hypertension, stroke, CHF, end-stage renal disease on dialysis, diabetes, GERD and aortic stenosis who is presenting here today with several complaints.  His initial complaint is ongoing swelling of his left lower extremity.  Patient was playing with his puppy less than 3 weeks ago and it scratched him on the medial/posterior portion of his left lower leg which then developed infection.  He notes the leg has been swollen for the last 2 weeks he did see his doctor and was given a course of doxycycline and Augmentin.  He did receive a dose of Rocephin in the office.  He completed the course of antibiotics yesterday and he and his family member agree that symptoms appear better but he is still having redness and swelling of his leg.  Patient is secondarily here because of shortness of breath and vomiting.  He reports that he will feel fullness in his abdomen and when he bends over it makes him short of breath as well as with exertion.  He has not had any productive cough, chest pain or fever.  He has been going to dialysis regularly and last dialysis was yesterday a full course and they remove 1.7 L.  He was at his dry weight when leaving.  This morning he had multiple episodes of vomiting where he denies any nausea but states just starts burping and then vomits.  It was yellow in color and appeared to be stomach bile.  He has been having normal bowel movements.  The history is provided by the patient.       Past Medical History:  Diagnosis Date  . Acute on chronic combined systolic and diastolic CHF, NYHA class 1 ([place]) 01/13/2015  . Allergy   . Arthritis   . Chronic kidney disease    MWF [name].  . Diabetes mellitus type 2  with complications ([place])    Previous insulin, non-insulin requiring noted 09/2018.  Complications include retinopathy, peripheral neuropathy.  Marland Kitchen GERD (gastroesophageal reflux disease)    at times  . Head injury with loss of consciousness ([place])   . History of kidney stones   . Hypertension   . Neuromuscular disorder ([place])   . Neuropathy   . Pulmonary hypertension (HCC)    PA peak pressure 41 mmHg 03/04/16 echo  . Stroke ([place])   . Varicose veins of lower extremities with complications 50/09/3816    Patient Active Problem List   Diagnosis Date Noted  . Wound of left leg 05/08/2020  . Left leg cellulitis 05/08/2020  . GERD (gastroesophageal reflux disease) 03/25/2020  . Aortic stenosis 02/25/2020  . Type 2 diabetes mellitus with diabetic neuropathy, unspecified ([place]) 02/25/2020  . History of stroke 02/25/2020  . Gallbladder abscess 11/15/2018  . Elevated LFTs 10/18/2018  . ESRD (end stage renal disease) on dialysis ([place]) 10/03/2018  . Other disorders of phosphorus metabolism 01/19/2018  . Secondary hyperparathyroidism of renal origin ([place]) 09/27/2017  . CKD (chronic kidney disease), stage IV ([place]) 01/14/2015  . Diabetes mellitus type II, non insulin dependent ([place]) 01/14/2015  . Obesity 01/14/2015  . Hypertension associated with diabetes ([place]) 01/14/2015  . Anemia of chronic kidney failure 01/14/2015  . Chronic diastolic CHF (congestive heart failure) ([place]) 01/13/2015  .  Varicose veins of lower extremities with complications 25/36/6440  . Chronic venous insufficiency 10/22/2014    Past Surgical History:  Procedure Laterality Date  . AV FISTULA PLACEMENT Left 06/01/2016   Procedure: ARTERIOVENOUS (AV) FISTULA CREATION;  Surgeon: [name], MD;  Location: [facility];  Service: Vascular;  Laterality: Left;  . AV FISTULA PLACEMENT Left 08/10/2017   Procedure: BRACHIOCEPHALIC ARTERIOVENOUS (AV) FISTULA CREATION LEFT UPPER EXTREMITY;  Surgeon: [name], MD;  Location: [facility];   Service: Vascular;  Laterality: Left;  . CHOLECYSTECTOMY N/A 10/04/2018   Procedure: LAPAROSCOPIC CHOLECYSTECTOMY;  Surgeon: Kinsinger, Arta Bruce, MD;  Location: [facility];  Service: General;  Laterality: N/A;  . COLONOSCOPY W/ POLYPECTOMY  03/2016   For evaluation of iron deficiency anemia.  Dr. Silverio Decamp.  10 polyps, largest 20 mm.  Pathology: tubular adenomas with no high-grade dysplasia, hyperplastic.  None bleeding internal hemorrhoids.  Pandiverticulosis.  Marland Kitchen ENDOVENOUS ABLATION SAPHENOUS VEIN W/ LASER Left 07-23-2015   endovenous laser ablation left greater saphenous vein by [name]  . ENDOVENOUS ABLATION SAPHENOUS VEIN W/ LASER Right 10/15/2015   EVLA right greater saphenous vein by [name]  . ESOPHAGOGASTRODUODENOSCOPY  03/2016   For evaluation of IDA.  Dr. Silverio Decamp.  Duodenal erythema, pathology benign mucosa, no villous atrophy.  Marland Kitchen GALLBLADDER SURGERY  10/2018  . IR RADIOLOGIST EVAL & MGMT  11/29/2018  . TEE WITHOUT CARDIOVERSION N/A 11/19/2018   Procedure: TRANSESOPHAGEAL ECHOCARDIOGRAM (TEE);  Surgeon: Acie Fredrickson Wonda Cheng, MD;  Location: [facility] ENDOSCOPY;  Service: Cardiovascular;  Laterality: N/A;       Family History  Problem Relation Age of Onset  . Arthritis Mother   . Hearing loss Father   . Prostate cancer Father   . Hyperlipidemia Maternal Grandmother   . Heart disease Maternal Grandmother   . Lung cancer Maternal Grandfather     Social History   Tobacco Use  . Smoking status: Former Smoker    Packs/day: 1.00    Years: 45.00    Pack years: 45.00    Types: Cigarettes    Quit date: 01/13/2015    Years since quitting: 5.3  . Smokeless tobacco: Never Used  Vaping Use  . Vaping Use: Never used  Substance Use Topics  . Alcohol use: Yes    Alcohol/week: 1.0 standard drink    Types: 1 Glasses of wine per week    Comment: rare occassion  . Drug use: No    Home Medications Prior to Admission medications   Medication Sig Start Date End Date Taking?  Authorizing Provider  amoxicillin-clavulanate (AUGMENTIN) 500-125 MG tablet Take 1 tablet (500 mg total) by mouth daily. 05/11/20   Marin Olp, MD  aspirin EC 81 MG tablet Take 81 mg by mouth daily. Swallow whole.    [provider]  b complex-vitamin c-folic acid (NEPHRO-VITE) 0.8 MG TABS tablet Take 1 tablet by mouth daily.    [provider]  cinacalcet (SENSIPAR) 60 MG tablet Take 60 mg by mouth daily.    [provider]  doxycycline (VIBRA-TABS) 100 MG tablet Take 1 tablet (100 mg total) by mouth 2 (two) times daily. 05/11/20   Marin Olp, MD  gabapentin (NEURONTIN) 300 MG capsule Take 1 capsule (300 mg total) by mouth at bedtime. 03/25/20   Marin Olp, MD  linagliptin (TRADJENTA) 5 MG TABS tablet Take 1 tablet (5 mg total) by mouth daily. 02/25/20   Marin Olp, MD  omeprazole (PRILOSEC) 20 MG capsule Take 20  mg by mouth daily before breakfast.  11/25/17   [provider]  omeprazole (PRILOSEC) 40 MG capsule Take 1 capsule (40 mg total) by mouth daily. Hold omeprazole 20 mg while taking omeprazole 40mg  dose for 1 month 03/25/20   Marin Olp, MD  sevelamer carbonate (RENVELA) 800 MG tablet Take 2 tablets (1,600 mg total) by mouth 3 (three) times daily with meals. 10/22/18   Aline August, MD    Allergies    Codeine and Clindamycin/lincomycin  Review of Systems   Review of Systems  All other systems reviewed and are negative.   Physical Exam Updated Vital Signs BP 135/63   Pulse 96   Resp (!) 26   Ht 5\' 8"  (1.727 m)   Wt 94 kg   SpO2 94%   BMI 31.51 kg/m   Physical Exam Vitals and nursing note reviewed.  Constitutional:      General: He is not in acute distress.    Appearance: He is well-developed.     Comments: Chronically ill-appearing  HENT:     Head: Normocephalic and atraumatic.     Mouth/Throat:     Mouth: Mucous membranes are moist.  Eyes:     Conjunctiva/sclera: Conjunctivae normal.     Pupils: Pupils  are equal, round, and reactive to light.  Cardiovascular:     Rate and Rhythm: Regular rhythm. Tachycardia present.     Pulses: Normal pulses.     Heart sounds: No murmur heard.   Pulmonary:     Effort: Pulmonary effort is normal. No respiratory distress.     Breath sounds: Examination of the left-lower field reveals decreased breath sounds. Decreased breath sounds present. No wheezing or rales.  Abdominal:     General: There is distension.     Palpations: Abdomen is soft. There is hepatomegaly.     Tenderness: There is no abdominal tenderness. There is no guarding or rebound.  Musculoskeletal:        General: No tenderness. Normal range of motion.     Cervical back: Normal range of motion and neck supple.     Right lower leg: Edema present.     Left lower leg: Edema present.     Comments: Trace edema in right lower extremity, 2+ pitting edema in the left lower extremity.  Superficial wound present on the posterior/medial left leg with mild weeping.  Dark red skin changes noted to most of the tib-fib sparing the foot and knee.  Mildly warm to the touch.  No upper leg redness or streaking.  Skin:    General: Skin is warm and dry.     Findings: No erythema or rash.  Neurological:     Mental Status: He is alert and oriented to person, place, and time. Mental status is at baseline.  Psychiatric:        Mood and Affect: Mood normal.        Behavior: Behavior normal.     ED Results / Procedures / Treatments   Labs (all labs ordered are listed, but only abnormal results are displayed) Labs Reviewed  CBC WITH DIFFERENTIAL/PLATELET - Abnormal; Notable for the following components:      Result Value   RBC 3.63 (*)    Hemoglobin 9.9 (*)    HCT 31.5 (*)    RDW 16.1 (*)    Lymphs Abs 0.6 (*)    All other components within normal limits  COMPREHENSIVE METABOLIC PANEL - Abnormal; Notable for the following components:   Chloride 96 (*)  Glucose, Bld 191 (*)    BUN 31 (*)     Creatinine, Ser 5.44 (*)    Calcium 8.7 (*)    Alkaline Phosphatase 408 (*)    GFR, Estimated 11 (*)    All other components within normal limits  TROPONIN I (HIGH SENSITIVITY) - Abnormal; Notable for the following components:   Troponin I (High Sensitivity) 18 (*)    All other components within normal limits  LIPASE, BLOOD    EKG None  Radiology DG Chest Port 1 View  Result Date: 05/17/2020 CLINICAL DATA:  Shortness of breath. EXAM: PORTABLE CHEST 1 VIEW COMPARISON:  04/21/2020 FINDINGS: Heart size is stable. Small left pleural effusion shows mild increase in size since previous study. Atelectasis or consolidation is seen in the left retrocardiac lung base. Right lung remains clear. IMPRESSION: Mild increase in size of small left pleural effusion. Left retrocardiac atelectasis versus consolidation. Electronically Signed   By: Marlaine Hind M.D.   On: 05/17/2020 14:34    Procedures Procedures   Medications Ordered in ED Medications - No data to display  ED Course  I have reviewed the triage vital signs and the nursing notes.  Pertinent labs & imaging results that were available during my care of the patient were reviewed by me and considered in my medical decision making (see chart for details).    MDM Rules/Calculators/A&P                          Patient is a 60 year old male with multiple medical problems presenting today with several complaints.  Patient does appear to have some ongoing cellulitis in the left lower extremity as well is edema.  Symptoms are improved but not resolved and finished antibiotics 1 day ago.  Feel that it is reasonable to continue antibiotics but will do an ultrasound to ensure no DVT. Patient is also having fullness of his abdomen and shortness of breath with activity and bending over.  He is completing dialysis on a regular basis and not missing any sessions.  He does not have prior history of liver disease and does not use drugs or alcohol.  He does  have a history of CHF which has improved since he started dialysis.  Patient's echo from 2020 showed an EF of 55 to 60% and normal ventricles.  Concerned that patient may have some fluid buildup and needs more fluid taken at dialysis.  He is not having cough or congestion suggestive for infection.  X-ray does show mild increase in size of small left pleural effusion and left retrocardiac atelectasis versus consolidation but again patient does not have infectious symptoms consistent with pneumonia at this time.  CBC is within normal limits and CMP without acute findings.  Final Clinical Impression(s) / ED Diagnoses Final diagnoses:  None    Rx / DC Orders ED Discharge Orders    None       Blanchie Dessert, MD 05/17/20 1553

## 2020-05-18 ENCOUNTER — Inpatient Hospital Stay (HOSPITAL_COMMUNITY): Payer: Medicare Other

## 2020-05-18 DIAGNOSIS — I5033 Acute on chronic diastolic (congestive) heart failure: Secondary | ICD-10-CM | POA: Diagnosis present

## 2020-05-18 DIAGNOSIS — E1122 Type 2 diabetes mellitus with diabetic chronic kidney disease: Secondary | ICD-10-CM | POA: Diagnosis present

## 2020-05-18 DIAGNOSIS — I132 Hypertensive heart and chronic kidney disease with heart failure and with stage 5 chronic kidney disease, or end stage renal disease: Secondary | ICD-10-CM | POA: Diagnosis present

## 2020-05-18 DIAGNOSIS — D631 Anemia in chronic kidney disease: Secondary | ICD-10-CM | POA: Diagnosis present

## 2020-05-18 DIAGNOSIS — I272 Pulmonary hypertension, unspecified: Secondary | ICD-10-CM | POA: Diagnosis present

## 2020-05-18 DIAGNOSIS — Z79899 Other long term (current) drug therapy: Secondary | ICD-10-CM | POA: Diagnosis not present

## 2020-05-18 DIAGNOSIS — K219 Gastro-esophageal reflux disease without esophagitis: Secondary | ICD-10-CM | POA: Diagnosis present

## 2020-05-18 DIAGNOSIS — Z8261 Family history of arthritis: Secondary | ICD-10-CM | POA: Diagnosis not present

## 2020-05-18 DIAGNOSIS — E8770 Fluid overload, unspecified: Secondary | ICD-10-CM | POA: Diagnosis present

## 2020-05-18 DIAGNOSIS — Z8673 Personal history of transient ischemic attack (TIA), and cerebral infarction without residual deficits: Secondary | ICD-10-CM | POA: Diagnosis not present

## 2020-05-18 DIAGNOSIS — L03116 Cellulitis of left lower limb: Secondary | ICD-10-CM | POA: Diagnosis present

## 2020-05-18 DIAGNOSIS — E1142 Type 2 diabetes mellitus with diabetic polyneuropathy: Secondary | ICD-10-CM | POA: Diagnosis present

## 2020-05-18 DIAGNOSIS — Z992 Dependence on renal dialysis: Secondary | ICD-10-CM | POA: Diagnosis not present

## 2020-05-18 DIAGNOSIS — Z8249 Family history of ischemic heart disease and other diseases of the circulatory system: Secondary | ICD-10-CM | POA: Diagnosis not present

## 2020-05-18 DIAGNOSIS — Z881 Allergy status to other antibiotic agents status: Secondary | ICD-10-CM | POA: Diagnosis not present

## 2020-05-18 DIAGNOSIS — Z8042 Family history of malignant neoplasm of prostate: Secondary | ICD-10-CM | POA: Diagnosis not present

## 2020-05-18 DIAGNOSIS — J9601 Acute respiratory failure with hypoxia: Secondary | ICD-10-CM | POA: Diagnosis present

## 2020-05-18 DIAGNOSIS — I350 Nonrheumatic aortic (valve) stenosis: Secondary | ICD-10-CM | POA: Diagnosis present

## 2020-05-18 DIAGNOSIS — Z7982 Long term (current) use of aspirin: Secondary | ICD-10-CM | POA: Diagnosis not present

## 2020-05-18 DIAGNOSIS — N2581 Secondary hyperparathyroidism of renal origin: Secondary | ICD-10-CM | POA: Diagnosis present

## 2020-05-18 DIAGNOSIS — Z83438 Family history of other disorder of lipoprotein metabolism and other lipidemia: Secondary | ICD-10-CM | POA: Diagnosis not present

## 2020-05-18 DIAGNOSIS — Z87891 Personal history of nicotine dependence: Secondary | ICD-10-CM | POA: Diagnosis not present

## 2020-05-18 DIAGNOSIS — Z20822 Contact with and (suspected) exposure to covid-19: Secondary | ICD-10-CM | POA: Diagnosis present

## 2020-05-18 DIAGNOSIS — N186 End stage renal disease: Secondary | ICD-10-CM | POA: Diagnosis present

## 2020-05-18 LAB — BASIC METABOLIC PANEL
Anion gap: 12 (ref 5–15)
BUN: 23 mg/dL — ABNORMAL HIGH (ref 6–20)
CO2: 23 mmol/L (ref 22–32)
Calcium: 6.5 mg/dL — ABNORMAL LOW (ref 8.9–10.3)
Chloride: 109 mmol/L (ref 98–111)
Creatinine, Ser: 4.55 mg/dL — ABNORMAL HIGH (ref 0.61–1.24)
GFR, Estimated: 14 mL/min — ABNORMAL LOW (ref >60)
Glucose, Bld: 162 mg/dL — ABNORMAL HIGH (ref 70–99)
Potassium: 3.1 mmol/L — ABNORMAL LOW (ref 3.5–5.1)
Sodium: 144 mmol/L (ref 135–145)

## 2020-05-18 LAB — MAGNESIUM: Magnesium: 1.5 mg/dL — ABNORMAL LOW (ref 1.7–2.4)

## 2020-05-18 LAB — CBC
HCT: 27.1 % — ABNORMAL LOW (ref 39.0–52.0)
Hemoglobin: 8.2 g/dL — ABNORMAL LOW (ref 13.0–17.0)
MCH: 26.9 pg (ref 26.0–34.0)
MCHC: 30.3 g/dL (ref 30.0–36.0)
MCV: 88.9 fL (ref 80.0–100.0)
Platelets: 214 10*3/uL (ref 150–400)
RBC: 3.05 MIL/uL — ABNORMAL LOW (ref 4.22–5.81)
RDW: 16.1 % — ABNORMAL HIGH (ref 11.5–15.5)
WBC: 6.9 10*3/uL (ref 4.0–10.5)
nRBC: 0 % (ref 0.0–0.2)

## 2020-05-18 LAB — HEMOGLOBIN A1C
Hgb A1c MFr Bld: 7.2 % — ABNORMAL HIGH (ref 4.8–5.6)
Mean Plasma Glucose: 159.94 mg/dL

## 2020-05-18 LAB — SARS CORONAVIRUS 2 (TAT 6-24 HRS): SARS Coronavirus 2: NEGATIVE

## 2020-05-18 LAB — PHOSPHORUS: Phosphorus: 3.4 mg/dL (ref 2.5–4.6)

## 2020-05-18 LAB — HIV ANTIBODY (ROUTINE TESTING W REFLEX): HIV Screen 4th Generation wRfx: NONREACTIVE

## 2020-05-18 LAB — GLUCOSE, CAPILLARY
Glucose-Capillary: 139 mg/dL — ABNORMAL HIGH (ref 70–99)
Glucose-Capillary: 181 mg/dL — ABNORMAL HIGH (ref 70–99)
Glucose-Capillary: 147 mg/dL — ABNORMAL HIGH (ref 70–99)
Glucose-Capillary: 200 mg/dL — ABNORMAL HIGH (ref 70–99)

## 2020-05-18 LAB — MRSA PCR SCREENING: MRSA by PCR: NEGATIVE

## 2020-05-18 MED ORDER — POTASSIUM CHLORIDE CRYS ER 20 MEQ PO TBCR
40.0000 meq | EXTENDED_RELEASE_TABLET | Freq: Once | ORAL | Status: AC
  Administered 2020-05-18: 40 meq via ORAL
  Filled 2020-05-18: qty 2

## 2020-05-18 MED ORDER — CHLORHEXIDINE GLUCONATE CLOTH 2 % EX PADS
6.0000 | MEDICATED_PAD | Freq: Every day | CUTANEOUS | Status: DC
  Administered 2020-05-19: 6 via TOPICAL

## 2020-05-18 MED ORDER — POTASSIUM CHLORIDE CRYS ER 20 MEQ PO TBCR: 40.0000 meq | EXTENDED_RELEASE_TABLET | Freq: Two times a day (BID) | ORAL | Status: DC

## 2020-05-18 MED ORDER — CHLORHEXIDINE GLUCONATE CLOTH 2 % EX PADS
6.0000 | MEDICATED_PAD | Freq: Every day | CUTANEOUS | Status: DC
  Administered 2020-05-18: 6 via TOPICAL

## 2020-05-18 MED ORDER — MAGNESIUM SULFATE 2 GM/50ML IV SOLN
2.0000 g | Freq: Once | INTRAVENOUS | Status: AC
  Administered 2020-05-18: 2 g via INTRAVENOUS
  Filled 2020-05-18: qty 50

## 2020-05-18 NOTE — Progress Notes (Signed)
   05/17/20 2115  Assess: MEWS Score  Temp 99.4 F (37.4 C)  BP (!) 152/76  Pulse Rate 95  ECG Heart Rate 96  Resp (!) 26  Level of Consciousness Alert  SpO2 100 %  O2 Device Nasal Cannula  O2 Flow Rate (L/min) 3 L/min  Assess: MEWS Score  MEWS Temp 0  MEWS Systolic 0  MEWS Pulse 0  MEWS RR 2  MEWS LOC 0  MEWS Score 2  MEWS Score Color Yellow  Assess: if the MEWS score is Yellow or Red  Were vital signs taken at a resting state? Yes  Focused Assessment No change from prior assessment  Early Detection of Sepsis Score *See Row Information* Low  MEWS guidelines implemented *See Row Information* No, other (Comment)  Treat  MEWS Interventions Administered scheduled meds/treatments  Pain Scale 0-10  Pain Score 0  Take Vital Signs  Increase Vital Sign Frequency  Yellow: Q 2hr X 2 then Q 4hr X 2, if remains yellow, continue Q 4hrs  Escalate  MEWS: Escalate Yellow: discuss with charge nurse/RN and consider discussing with provider and RRT  Notify: Charge Nurse/RN  Name of Charge Nurse/RN Notified kristina  Date Charge Nurse/RN Notified 05/17/20  Time Charge Nurse/RN Notified 2115

## 2020-05-18 NOTE — Procedures (Signed)
I was present at this dialysis session, have reviewed the session itself and made  appropriate changes.  UF 3.5L today.   If not clinically improved after today's treatment he may benefit from an DUF treatment tomorrow.  Jannifer Hick MD [facility] pager [phone]   05/18/2020, 2:13 PM

## 2020-05-18 NOTE — Progress Notes (Signed)
PROGRESS NOTE  Edgar Brewer PJK:[id number] DOB: [birth year] DOA: 05/17/2020 PCP: Marin Olp, MD  HPI/Recap of past 24 hours: Edgar Brewer is a 60 y.o. male with medical history significant for ESRD on hemodialysis, chronic diastolic CHF, type 2 diabetes mellitus, and chronic normocytic anemia, now presenting to the emergency department with shortness of breath and left lower leg swelling and redness.  Patient reports that he had a superficial scratch from his dog on the posteromedial aspect of his lower left leg approximately 2 weeks ago with subsequent redness and swelling.  He has been treated with doxycycline and Augmentin and while the wound appears to have healed, he has persistent redness and swelling of the lower left leg.  He has also developed shortness of breath over the past couple days, has not noted any fevers or chills, has not had any chest pain, and has not had much cough.  He has not noticed any orthopnea.  He had an episode of vomiting yesterday morning.  Reports that he completed his dialysis session on 05/15/2020, reports strict adherence with his fluid restrictions, but does not pay attention to salt intake.   05/18/20: Seen and examined this morning at his bedside.  States his dyspnea is improved even though he is noted to have increased work of breathing.  Had hemodialysis today.  Assessment/Plan: Principal Problem:   Acute respiratory failure with hypoxia (HCC) Active Problems:   Diabetes mellitus type II, non insulin dependent (HCC)   ESRD (end stage renal disease) on dialysis ([place])   Left leg cellulitis  Acute hypoxic respiratory failure secondary to acute on chronic diastolic CHF Not on oxygen supplementation at baseline Currently requiring 3 L to maintain oxygen saturation greater than 92%. Personally reviewed chest x-ray done on admission which shows left pleural effusion versus atelectasis Elevated BNP on presentation greater than 900. Had hemodialysis on  05/18/2020. Volume managed with hemodialysis. Continue to maintain a saturation greater than 92%. Wean off oxygen supplementation as tolerated.  Left lower extremity cellulitis, failed outpatient treatment. He was on doxycycline and Augmentin as outpatient. Left lower extremity erythematous, edematous and warm Started on Rocephin, continue. Blood culture negative to date, continue to monitor cultures.  ESRD on HD MWF Hemodialysis on 05/18/2020. Patient nephrology's assistance Electrolytes and volume status managed with hemodialysis.  Type 2 diabetes with hyperglycemia Hemoglobin A1c 7.2 on 05/18/2020. Continuous insulin scale  Hypokalemia Potassium 3.1 Managed with hemodialysis.  Hypomagnesemia Magnesium 1.5 Managed with hemodialysis.  Anemia of chronic disease in the setting of ESRD Hemoglobin 8.2 from 9.9 No overt bleeding Continue to monitor    Code Status: Full code.  Family Communication: None at bedside.  Disposition Plan: Discharged to prior environment.   Consultants:  Nephrology.  Procedures:  Hemodialysis on 05/18/2020.  Antimicrobials:  Rocephin.  DVT prophylaxis: Subcu heparin 3 times daily.  Status is: Inpatient    Dispo:  Patient From: Home  Planned Disposition: Home possibly on 05/20/2020 or when cellulitis has improved.  Medically stable for discharge: No          Objective: Vitals:   05/18/20 1530 05/18/20 1600 05/18/20 1700 05/18/20 1720  BP: (!) 141/67 138/67 (!) 153/72 (!) 142/65  Pulse:    97  Resp:    (!) 35  Temp:    99 F (37.2 C)  TempSrc:    Oral  SpO2:      Weight:    91 kg  Height:        Intake/Output Summary (Last 24 hours)  at 05/18/2020 1802 Last data filed at 05/18/2020 1720 Gross per 24 hour  Intake 399.91 ml  Output 3600 ml  Net -3200.09 ml   Filed Weights   05/18/20 0620 05/18/20 1300 05/18/20 1720  Weight: 92.6 kg 94.2 kg 91 kg    Exam:  . General: 60 y.o. year-old male well developed well  nourished in no acute distress.  Alert and oriented x3. . Cardiovascular: Regular rate and rhythm with no rubs or gallops.  No thyromegaly or JVD noted.   Marland Kitchen Respiratory: Mild rales at bases with no wheezing noted.  Poor inspiratory effort.  Use of assessor muscles to breathe this morning prior to hemodialysis.  . Abdomen: Soft nontender nondistended with normal bowel sounds x4 quadrants. . Musculoskeletal: Bilateral lower extremity edema.  Left greater than right. . Skin: Left lower extremity erythematous, edematous, warm. Marland Kitchen Psychiatry: Mood is appropriate for condition and setting   Data Reviewed: CBC: Recent Labs  Lab 05/17/20 1400 05/18/20 0028  WBC 7.3 6.9  NEUTROABS 5.9  --   HGB 9.9* 8.2*  HCT 31.5* 27.1*  MCV 86.8 88.9  PLT 266 678   Basic Metabolic Panel: Recent Labs  Lab 05/17/20 1400 05/18/20 0028  NA 136 144  K 3.9 3.1*  CL 96* 109  CO2 30 23  GLUCOSE 191* 162*  BUN 31* 23*  CREATININE 5.44* 4.55*  CALCIUM 8.7* 6.5*  MG  --  1.5*  PHOS  --  3.4   GFR: Estimated Creatinine Clearance: 18.9 mL/min (A) (by C-G formula based on SCr of 4.55 mg/dL (H)). Liver Function Tests: Recent Labs  Lab 05/17/20 1400  AST 19  ALT 17  ALKPHOS 408*  BILITOT 0.6  PROT 6.6  ALBUMIN 3.6   Recent Labs  Lab 05/17/20 1400  LIPASE 28   No results for input(s): AMMONIA in the last 168 hours. Coagulation Profile: No results for input(s): INR, PROTIME in the last 168 hours. Cardiac Enzymes: No results for input(s): CKTOTAL, CKMB, CKMBINDEX, TROPONINI in the last 168 hours. BNP (last 3 results) No results for input(s): PROBNP in the last 8760 hours. HbA1C: Recent Labs    05/18/20 0028  HGBA1C 7.2*   CBG: Recent Labs  Lab 05/17/20 2117 05/18/20 0814 05/18/20 1157  GLUCAP 247* 139* 181*   Lipid Profile: No results for input(s): CHOL, HDL, LDLCALC, TRIG, CHOLHDL, LDLDIRECT in the last 72 hours. Thyroid Function Tests: No results for input(s): TSH, T4TOTAL,  FREET4, T3FREE, THYROIDAB in the last 72 hours. Anemia Panel: No results for input(s): VITAMINB12, FOLATE, FERRITIN, TIBC, IRON, RETICCTPCT in the last 72 hours. Urine analysis:    Component Value Date/Time   COLORURINE AMBER (A) 11/18/2018 0807   APPEARANCEUR CLEAR 11/18/2018 0807   LABSPEC 1.015 11/18/2018 0807   PHURINE 6.0 11/18/2018 0807   GLUCOSEU >=500 (A) 11/18/2018 0807   HGBUR NEGATIVE 11/18/2018 0807   BILIRUBINUR SMALL (A) 11/18/2018 0807   KETONESUR 5 (A) 11/18/2018 0807   PROTEINUR >=300 (A) 11/18/2018 0807   NITRITE NEGATIVE 11/18/2018 0807   LEUKOCYTESUR NEGATIVE 11/18/2018 0807   Sepsis Labs: @LABRCNTIP (procalcitonin:4,lacticidven:4)  ) Recent Results (from the past 240 hour(s))  SARS CORONAVIRUS 2 (TAT 6-24 HRS) Nasopharyngeal Nasopharyngeal Swab     Status: None   Collection Time: 05/17/20  5:45 PM   Specimen: Nasopharyngeal Swab  Result Value Ref Range Status   SARS Coronavirus 2 NEGATIVE NEGATIVE Final    Comment: (NOTE) SARS-CoV-2 target nucleic acids are NOT DETECTED.  The SARS-CoV-2 RNA is generally detectable  in upper and lower respiratory specimens during the acute phase of infection. Negative results do not preclude SARS-CoV-2 infection, do not rule out co-infections with other pathogens, and should not be used as the sole basis for treatment or other patient management decisions. Negative results must be combined with clinical observations, patient history, and epidemiological information. The expected result is Negative.  Fact Sheet for Patients: SugarRoll.be  Fact Sheet for Healthcare Providers: https://www.woods-mathews.com/  This test is not yet approved or cleared by the [place] FDA and  has been authorized for detection and/or diagnosis of SARS-CoV-2 by FDA under an Emergency Use Authorization (EUA). This EUA will remain  in effect (meaning this test can be used) for the duration of  the COVID-19 declaration under Se ction 564(b)(1) of the Act, 21 U.S.C. section 360bbb-3(b)(1), unless the authorization is terminated or revoked sooner.  Performed at [facility], [place] [address], [place], [place] [address]   Culture, blood (routine x 2)     Status: None (Preliminary result)   Collection Time: 05/18/20 12:28 AM   Specimen: BLOOD  Result Value Ref Range Status   Specimen Description BLOOD SITE NOT SPECIFIED  Final   Special Requests   Final    BOTTLES DRAWN AEROBIC ONLY Blood Culture results may not be optimal due to an inadequate volume of blood received in culture bottles   Culture   Final    NO GROWTH < 12 HOURS Performed at [facility], [place] [address] [place], [place] [address]    Report Status PENDING  Incomplete  Culture, blood (routine x 2)     Status: None (Preliminary result)   Collection Time: 05/18/20 12:28 AM   Specimen: BLOOD  Result Value Ref Range Status   Specimen Description BLOOD SITE NOT SPECIFIED  Final   Special Requests   Final    BOTTLES DRAWN AEROBIC ONLY Blood Culture results may not be optimal due to an inadequate volume of blood received in culture bottles   Culture   Final    NO GROWTH < 12 HOURS Performed at [facility], [place] [address] [place], [place] [address]    Report Status PENDING  Incomplete  MRSA PCR Screening     Status: None   Collection Time: 05/18/20  1:56 AM   Specimen: Nasal Mucosa; Nasopharyngeal  Result Value Ref Range Status   MRSA by PCR NEGATIVE NEGATIVE Final    Comment:        The GeneXpert MRSA Assay (FDA approved for NASAL specimens only), is one component of a comprehensive MRSA colonization surveillance program. It is not intended to diagnose MRSA infection nor to guide or monitor treatment for MRSA infections. Performed at [facility], [place] [address], [place], [place] [address]       Studies: No results found.  Scheduled Meds: . aspirin EC  81 mg Oral Daily  .  [START ON 05/19/2020] Chlorhexidine Gluconate Cloth  6 each Topical Q0600  . gabapentin  300 mg Oral QHS  . heparin  5,000 Units Subcutaneous Q8H  . insulin aspart  0-6 Units Subcutaneous TID WC  . linagliptin  5 mg Oral Daily  . pantoprazole  40 mg Oral Daily  . sevelamer carbonate  1,600 mg Oral TID WC    Continuous Infusions: . cefTRIAXone (ROCEPHIN)  IV Stopped (05/18/20 0045)     LOS: 0 days     Kayleen Memos, MD [facility] Pager [phone]  If 7PM-7AM, please  contact night-coverage [email or web address] Password [facility] 05/18/2020, 6:02 PM

## 2020-05-18 NOTE — Progress Notes (Signed)
  [facility] Progress Note   Subjective:   Patient admitted to observation for shortness of breath and leg swelling. Found to have left leg cellulitis and started on rocephin. SpO2 in the 80's on arrival, 1 view CXR with small left pleural effusion and retrocardiac atelectasis vs consolidation. Noted to be hypervolemic on exam. Nephrology consulted for management of ESRD.  At present, patient reports feeling "ok," denies leg pain but reports it has been very swollen lately. SOB improved on O2. Denies CP, palpitations, dizziness, abdominal pain, nausea, fever and chills.   Objective Vitals:   05/18/20 0620 05/18/20 0700 05/18/20 0800 05/18/20 0817  BP:    135/74  Pulse:  89 88 95  Resp:    20  Temp:    98.2 F (36.8 C)  TempSrc:    Oral  SpO2:  95% 97% 95%  Weight: 92.6 kg     Height:       Physical Exam General: Well developed male in NAD Heart: slightly tachycardic, regular rhythm, no murmur or gallos Lungs: Decreased breath sounds b/l bases but no rhonchi, rales or wheezing auscultated.  Abdomen: Soft, non-distended, +BS Extremities: L leg erythematous with 3+ edema, R leg trace pitting edema Dialysis Access: LUE AVF + bruit  Additional Objective Labs: Basic Metabolic Panel: Recent Labs  Lab 05/17/20 1400 05/18/20 0028  NA 136 144  K 3.9 3.1*  CL 96* 109  CO2 30 23  GLUCOSE 191* 162*  BUN 31* 23*  CREATININE 5.44* 4.55*  CALCIUM 8.7* 6.5*  PHOS  --  3.4   Liver Function Tests: Recent Labs  Lab 05/17/20 1400  AST 19  ALT 17  ALKPHOS 408*  BILITOT 0.6  PROT 6.6  ALBUMIN 3.6   Recent Labs  Lab 05/17/20 1400  LIPASE 28   CBC: Recent Labs  Lab 05/17/20 1400 05/18/20 0028  WBC 7.3 6.9  NEUTROABS 5.9  --   HGB 9.9* 8.2*  HCT 31.5* 27.1*  MCV 86.8 88.9  PLT 266 214    Medications: . cefTRIAXone (ROCEPHIN)  IV Stopped (05/18/20 0045)  . magnesium sulfate bolus IVPB     . aspirin EC  81 mg Oral Daily  . Chlorhexidine Gluconate  Cloth  6 each Topical Q0600  . cinacalcet  60 mg Oral Daily  . gabapentin  300 mg Oral QHS  . heparin  5,000 Units Subcutaneous Q8H  . insulin aspart  0-6 Units Subcutaneous TID WC  . linagliptin  5 mg Oral Daily  . pantoprazole  40 mg Oral Daily  . potassium chloride  40 mEq Oral BID  . sevelamer carbonate  1,600 mg Oral TID WC    Dialysis Orders: [facility] on MWF 180NRE, 4 hours, BFR 400, DFR 500, EDW 92kg, 2K/2.5Ca, AVF 15g, heparin 2600 unit bolus  Assessment/Plan: 1. Left lower leg cellulitis: On rocephin, management per primary team.  2. ESRD: Dialyzes on MWF schedule, plan for HD today 3. HTN/volume:  Volume overloaded and hypoxic on exam. UF with HD today as tolerated, likely need new EDW. K+ 3.1, use higher K bath. Note potassium 73mEq BID ordered, will reduce to 1 dose due to ESRD.  4. Anemia: Hemoglobin 8.2. Received mircera 119mcg on 05/13/20.  5. Secondary hyperparathyroidism: Calcium low today, will hold sensipar pending recheck on next labs. Continue renvela.    6. Nutrition:  Renal diet/fluid restrictions.  Anice Paganini, PA-C 05/18/2020, 8:18 AM  [facility] Pager: [phone]

## 2020-05-19 LAB — CBC
HCT: 34.2 % — ABNORMAL LOW (ref 39.0–52.0)
Hemoglobin: 10.2 g/dL — ABNORMAL LOW (ref 13.0–17.0)
MCH: 26.2 pg (ref 26.0–34.0)
MCHC: 29.8 g/dL — ABNORMAL LOW (ref 30.0–36.0)
MCV: 87.9 fL (ref 80.0–100.0)
Platelets: 297 10*3/uL (ref 150–400)
RBC: 3.89 MIL/uL — ABNORMAL LOW (ref 4.22–5.81)
RDW: 16.2 % — ABNORMAL HIGH (ref 11.5–15.5)
WBC: 7.4 10*3/uL (ref 4.0–10.5)
nRBC: 0 % (ref 0.0–0.2)

## 2020-05-19 LAB — BASIC METABOLIC PANEL
Anion gap: 8 (ref 5–15)
BUN: 20 mg/dL (ref 8–23)
CO2: 28 mmol/L (ref 22–32)
Calcium: 9.4 mg/dL (ref 8.9–10.3)
Chloride: 101 mmol/L (ref 98–111)
Creatinine, Ser: 4.43 mg/dL — ABNORMAL HIGH (ref 0.61–1.24)
GFR, Estimated: 14 mL/min — ABNORMAL LOW (ref >60)
Glucose, Bld: 151 mg/dL — ABNORMAL HIGH (ref 70–99)
Potassium: 4.4 mmol/L (ref 3.5–5.1)
Sodium: 137 mmol/L (ref 135–145)

## 2020-05-19 LAB — GLUCOSE, CAPILLARY
Glucose-Capillary: 160 mg/dL — ABNORMAL HIGH (ref 70–99)
Glucose-Capillary: 138 mg/dL — ABNORMAL HIGH (ref 70–99)
Glucose-Capillary: 164 mg/dL — ABNORMAL HIGH (ref 70–99)
Glucose-Capillary: 267 mg/dL — ABNORMAL HIGH (ref 70–99)

## 2020-05-19 MED ORDER — CHLORHEXIDINE GLUCONATE CLOTH 2 % EX PADS
6.0000 | MEDICATED_PAD | Freq: Every day | CUTANEOUS | Status: DC
  Administered 2020-05-19: 6 via TOPICAL

## 2020-05-19 MED ORDER — CINACALCET HCL 30 MG PO TABS
60.0000 mg | ORAL_TABLET | Freq: Every day | ORAL | Status: DC
  Administered 2020-05-19: 60 mg via ORAL
  Filled 2020-05-19 (×3): qty 2

## 2020-05-19 NOTE — Evaluation (Signed)
Physical Therapy Evaluation Patient Details Name: Edgar Brewer MRN: [id number] DOB: [birth year] Today's Date: 05/19/2020   History of Present Illness  Patient is a 60 y/o male who presents on 05/17/20 with SOB and LLE redness/swelling. CXR- small left pleural effusion and left retrocardiac atelectasis. Admitted with acute hypoxic respiratory failure and acute on chronic diastolic CHF as well as LLE cellulitis. PMH includes ESRD on HD, DM, CHF, HTN, CVA, neuropathy.  Clinical Impression  Patient presents with generalized weakness, dyspnea on exertion, decreased activity tolerance, impaired balance and impaired mobility s/p above. Pt lives at home with mother and does his own ADLs and walking PTA using SPC vs RW as needed for longer distances. Mother drives him to HD and the store. Today, pt tolerated transfers and gait training Min guard for safety with 2/4 DOE. Sp02 stayed >88% on RA throughout walk however once back in room and after using bathroom, it dropped to mid 80s but rebounds quickly with rest. Donned 1L/min 02 [place] at end of session. Encouraged increasing activity slowly and walking to bathroom as able. Will follow acutely to maximize independence and mobility prior to return home.    Follow Up Recommendations No PT follow up    Equipment Recommendations  None recommended by PT    Recommendations for Other Services       Precautions / Restrictions Precautions Precautions: Fall;Other (comment) Precaution Comments: watch 02 Restrictions Weight Bearing Restrictions: No      Mobility  Bed Mobility Overal bed mobility: Modified Independent             General bed mobility comments: No assist needed. Use of rail.    Transfers Overall transfer level: Needs assistance Equipment used: None Transfers: Sit to/from Stand Sit to Stand: Min guard         General transfer comment: Min guard for safety. Stood from [employer] without difficulty, from toilet  x1.  Ambulation/Gait Ambulation/Gait assistance: Min guard Gait Distance (Feet): 300 Feet Assistive device: None Gait Pattern/deviations: Step-through pattern;Decreased stride length;Drifts right/left Gait velocity: decreased   General Gait Details: Slow, mildly unsteady gait with some drifting noted but no overt LOB. 2/4 DOE. Sp02 remained >88% on RA throughout, dropped to mid 80s after using bathroom but rebounds quickly with rest and 02.  Stairs            Wheelchair Mobility    Modified Rankin (Stroke Patients Only)       Balance Overall balance assessment: Needs assistance Sitting-balance support: Feet supported;No upper extremity supported Sitting balance-Leahy Scale: Good     Standing balance support: During functional activity Standing balance-Leahy Scale: Fair                               Pertinent Vitals/Pain Pain Assessment: No/denies pain    Home Living Family/patient expects to be discharged to:: Private residence Living Arrangements: Parent (mother) Available Help at Discharge: Family;Available PRN/intermittently Type of Home: House Home Access: Stairs to enter Entrance Stairs-Rails: Right Entrance Stairs-Number of Steps: a couple Home Layout: One level Home Equipment: Cane - single point;Walker - 2 wheels      Prior Function Level of Independence: Independent with assistive device(s)         Comments: Uses SPC for longer walks outside the house; mother drives him.     Hand Dominance   Dominant Hand: Right    Extremity/Trunk Assessment   Upper Extremity Assessment Upper Extremity Assessment: Defer to  OT evaluation    Lower Extremity Assessment Lower Extremity Assessment: RLE deficits/detail;LLE deficits/detail RLE Sensation: history of peripheral neuropathy LLE Deficits / Details: Mild redness and warmth LLE LLE Sensation: history of peripheral neuropathy;decreased light touch    Cervical / Trunk  Assessment Cervical / Trunk Assessment: Normal  Communication   Communication: No difficulties  Cognition Arousal/Alertness: Awake/alert Behavior During Therapy: WFL for tasks assessed/performed Overall Cognitive Status: No family/caregiver present to determine baseline cognitive functioning                                 General Comments: Poor understanding of health and health conditions/disease processes; asking questions regarding HF. Repetition needed at times as pt not answering questions directly.      General Comments General comments (skin integrity, edema, etc.): Swelling, redness LLE and tingling    Exercises     Assessment/Plan    PT Assessment Patient needs continued PT services  PT Problem List Decreased strength;Decreased mobility;Decreased safety awareness;Impaired sensation;Decreased balance;Cardiopulmonary status limiting activity;Decreased cognition       PT Treatment Interventions Therapeutic exercise;Gait training;Stair training;Functional mobility training;Therapeutic activities;Patient/family education;Balance training    PT Goals (Current goals can be found in the Care Plan section)  Acute Rehab PT Goals Patient Stated Goal: to go home PT Goal Formulation: With patient Time For Goal Achievement: 06/02/20 Potential to Achieve Goals: Good    Frequency Min 3X/week   Barriers to discharge Inaccessible home environment stairs    Co-evaluation               AM-PAC PT "6 Clicks" Mobility  Outcome Measure Help needed turning from your back to your side while in a flat bed without using bedrails?: None Help needed moving from lying on your back to sitting on the side of a flat bed without using bedrails?: None Help needed moving to and from a bed to a chair (including a wheelchair)?: A Little Help needed standing up from a chair using your arms (e.g., wheelchair or bedside chair)?: A Little Help needed to walk in hospital room?: A  Little Help needed climbing 3-5 steps with a railing? : A Little 6 Click Score: 20    End of Session Equipment Utilized During Treatment: Gait belt Activity Tolerance: Treatment limited secondary to medical complications (Comment) (drop in Sp02) Patient left: in bed;with call bell/phone within reach Nurse Communication: Mobility status PT Visit Diagnosis: Unsteadiness on feet (R26.81);Difficulty in walking, not elsewhere classified (R26.2)    Time: 1323-1350 PT Time Calculation (min) (ACUTE ONLY): 27 min   Charges:   PT Evaluation $PT Eval Moderate Complexity: 1 Mod PT Treatments $Gait Training: 8-22 mins        Marisa Severin, PT, DPT Acute Rehabilitation Services Pager [phone] Office [place] 05/19/2020, 3:14 PM

## 2020-05-19 NOTE — Progress Notes (Signed)
PROGRESS NOTE  Edgar Brewer EYC:[id number] DOB: [birth year] DOA: 05/17/2020 PCP: Marin Olp, MD  HPI/Recap of past 24 hours: Edgar Brewer is a 60 y.o. male with medical history significant for ESRD on HD MWF, chronic diastolic CHF, type 2 diabetes mellitus, and chronic normocytic anemia, who presents to the ED with dyspnea at rest and left lower extremity cellulitis, failed outpatient treatment.  Had a superficial scratch from his dog on the posteromedial aspect of his lower left leg approximately 2 weeks prior to presentation with subsequent redness and swelling.  Treated with doxycycline and Augmentin outpatient unsuccessfully.  He developed shortness of breath over the past couple days, has not noted any fevers or chills, has not had any chest pain, and has not had much cough.  Reports that he completed his dialysis session on 05/15/2020, reports strict adherence with his fluid restrictions, but not to his salt intake.   Hypoxic on presentation, not on oxygen supplementation at baseline.  Volume overload on exam.  He was hemodialyzed on 05/18/2020.  05/19/20: Seen and examined at his bedside.  He denies having any dyspnea while at rest.  Use of accessory muscles to breathe and abdominal breathing noted on exam.  Assessment/Plan: Principal Problem:   Acute respiratory failure with hypoxia (HCC) Active Problems:   Diabetes mellitus type II, non insulin dependent (HCC)   ESRD (end stage renal disease) on dialysis ([place])   Left leg cellulitis  Acute hypoxic respiratory failure secondary to volume overload and left pleural effusion likely from acute on chronic diastolic CHF Not on oxygen supplementation at baseline Denies missing hemodialysis but admits to noncompliance with salt intake. Currently requiring 3 L to maintain oxygen saturation greater than 92%. Personally reviewed chest x-ray done on admission which shows left pleural effusion  Elevated BNP on presentation greater than  900. Had hemodialysis on 05/18/2020. Volume managed with hemodialysis. Continue to maintain a saturation greater than 92%. Wean off oxygen supplementation as tolerated. Plan home oxygen evaluation prior to DC Would benefit from another session of hemodialysis Repeat BNP tomorrow  Left lower extremity cellulitis, possibly from dog scratch at home, failed outpatient treatment. He was on doxycycline and Augmentin as outpatient. Left lower extremity erythematous, edematous and warm Continue on Rocephin. MRSA screen negative on 05/18/2020. Blood cultures negative to date, continue to monitor blood cultures. Currently afebrile with no leukocytosis Will benefit from additional 1-2 days of IV antibiotics Doppler ultrasound done on 05/17/2020 negative for DVT however showed mildly enlarged left groin lymph node, findings likely reflect infection given the clinical history.  ESRD on HD MWF Hemodialysis on 05/18/2020. Appreciate nephrology's assistance. Electrolytes and volume status managed with hemodialysis.  Type 2 diabetes with hyperglycemia Hemoglobin A1c 7.2 on 05/18/2020. Continuous insulin sliding scale  Resolved post hemodialysis: Hypokalemia Potassium 3.1>> 4.4. Managed with hemodialysis.  Hypomagnesemia Magnesium 1.5 Managed with hemodialysis. Repeat magnesium level in the morning.  Anemia of chronic disease in the setting of ESRD Hemoglobin 8.2 from 9.9 Suspect dilutional in the setting of volume overload. Repeated hemoglobin 10.2 without blood transfusion. No overt bleeding Continue to monitor H&H.  Generalized weakness PT OT to assess Fall precautions Out of bed to chair with every shift Mobilize as tolerated with assistance.  Code Status: Full code.  Family Communication: None at bedside.  Disposition Plan: Discharged to prior environment.   Consultants:  Nephrology.  Procedures:  Hemodialysis on 05/18/2020.  Antimicrobials:  Rocephin.  DVT  prophylaxis: Subcu heparin 3 times daily.  Status is: Inpatient  Dispo:  Patient From: Home  Planned Disposition: Home possibly on 05/21/2020 or when cellulitis has improved.  Medically stable for discharge: No          Objective: Vitals:   05/18/20 2351 05/19/20 0509 05/19/20 0800 05/19/20 1100  BP: (!) 159/79 140/65 (!) 171/87 (!) 109/94  Pulse: 97 87 95 91  Resp: 16 17 18 18   Temp: 99 F (37.2 C) 98.6 F (37 C) 97.7 F (36.5 C) 98.3 F (36.8 C)  TempSrc: Oral Oral  Oral  SpO2: 96% 90% 96% 95%  Weight:  88.8 kg    Height:        Intake/Output Summary (Last 24 hours) at 05/19/2020 1205 Last data filed at 05/19/2020 0900 Gross per 24 hour  Intake 220 ml  Output 3002 ml  Net -2782 ml   Filed Weights   05/18/20 1300 05/18/20 1720 05/19/20 0509  Weight: 94.2 kg 91 kg 88.8 kg    Exam:  . General: 60 y.o. year-old male frail-appearing in no acute distress.  He is alert and oriented x3.   . Cardiovascular: Regular rate and rhythm no rubs or gallops. Marland Kitchen Respiratory: Mild rales at bases with no wheezing noted.  Moderate use of accessory muscles to breathe.   . Abdomen: Obese soft nontender normal bowel sounds present.  Musculoskeletal: Trace lower extremity edema bilaterally.  Left lower extremity mildly edematous with erythema and tenderness, appears approved. . Skin: Left lower extremity erythematous, edematous and warm. Marland Kitchen Psychiatry: Mood is appropriate for condition and setting.   Data Reviewed: CBC: Recent Labs  Lab 05/17/20 1400 05/18/20 0028 05/19/20 0558  WBC 7.3 6.9 7.4  NEUTROABS 5.9  --   --   HGB 9.9* 8.2* 10.2*  HCT 31.5* 27.1* 34.2*  MCV 86.8 88.9 87.9  PLT 266 214 623   Basic Metabolic Panel: Recent Labs  Lab 05/17/20 1400 05/18/20 0028 05/19/20 0558  NA 136 144 137  K 3.9 3.1* 4.4  CL 96* 109 101  CO2 30 23 28   GLUCOSE 191* 162* 151*  BUN 31* 23* 20  CREATININE 5.44* 4.55* 4.43*  CALCIUM 8.7* 6.5* 9.4  MG  --  1.5*  --   PHOS   --  3.4  --    GFR: Estimated Creatinine Clearance: 19 mL/min (A) (by C-G formula based on SCr of 4.43 mg/dL (H)). Liver Function Tests: Recent Labs  Lab 05/17/20 1400  AST 19  ALT 17  ALKPHOS 408*  BILITOT 0.6  PROT 6.6  ALBUMIN 3.6   Recent Labs  Lab 05/17/20 1400  LIPASE 28   No results for input(s): AMMONIA in the last 168 hours. Coagulation Profile: No results for input(s): INR, PROTIME in the last 168 hours. Cardiac Enzymes: No results for input(s): CKTOTAL, CKMB, CKMBINDEX, TROPONINI in the last 168 hours. BNP (last 3 results) No results for input(s): PROBNP in the last 8760 hours. HbA1C: Recent Labs    05/18/20 0028  HGBA1C 7.2*   CBG: Recent Labs  Lab 05/18/20 1157 05/18/20 1803 05/18/20 2117 05/19/20 0807 05/19/20 1131  GLUCAP 181* 147* 200* 160* 138*   Lipid Profile: No results for input(s): CHOL, HDL, LDLCALC, TRIG, CHOLHDL, LDLDIRECT in the last 72 hours. Thyroid Function Tests: No results for input(s): TSH, T4TOTAL, FREET4, T3FREE, THYROIDAB in the last 72 hours. Anemia Panel: No results for input(s): VITAMINB12, FOLATE, FERRITIN, TIBC, IRON, RETICCTPCT in the last 72 hours. Urine analysis:    Component Value Date/Time   COLORURINE AMBER (A) 11/18/2018 7628  APPEARANCEUR CLEAR 11/18/2018 0807   LABSPEC 1.015 11/18/2018 0807   PHURINE 6.0 11/18/2018 0807   GLUCOSEU >=500 (A) 11/18/2018 0807   HGBUR NEGATIVE 11/18/2018 0807   BILIRUBINUR SMALL (A) 11/18/2018 0807   KETONESUR 5 (A) 11/18/2018 0807   PROTEINUR >=300 (A) 11/18/2018 0807   NITRITE NEGATIVE 11/18/2018 0807   LEUKOCYTESUR NEGATIVE 11/18/2018 0807   Sepsis Labs: @LABRCNTIP (procalcitonin:4,lacticidven:4)  ) Recent Results (from the past 240 hour(s))  SARS CORONAVIRUS 2 (TAT 6-24 HRS) Nasopharyngeal Nasopharyngeal Swab     Status: None   Collection Time: 05/17/20  5:45 PM   Specimen: Nasopharyngeal Swab  Result Value Ref Range Status   SARS Coronavirus 2 NEGATIVE NEGATIVE  Final    Comment: (NOTE) SARS-CoV-2 target nucleic acids are NOT DETECTED.  The SARS-CoV-2 RNA is generally detectable in upper and lower respiratory specimens during the acute phase of infection. Negative results do not preclude SARS-CoV-2 infection, do not rule out co-infections with other pathogens, and should not be used as the sole basis for treatment or other patient management decisions. Negative results must be combined with clinical observations, patient history, and epidemiological information. The expected result is Negative.  Fact Sheet for Patients: SugarRoll.be  Fact Sheet for Healthcare Providers: https://www.woods-mathews.com/  This test is not yet approved or cleared by the [place] FDA and  has been authorized for detection and/or diagnosis of SARS-CoV-2 by FDA under an Emergency Use Authorization (EUA). This EUA will remain  in effect (meaning this test can be used) for the duration of the COVID-19 declaration under Se ction 564(b)(1) of the Act, 21 U.S.C. section 360bbb-3(b)(1), unless the authorization is terminated or revoked sooner.  Performed at [facility], [place] [address] [place], [place] [address]   Culture, blood (routine x 2)     Status: None (Preliminary result)   Collection Time: 05/18/20 12:28 AM   Specimen: BLOOD  Result Value Ref Range Status   Specimen Description BLOOD SITE NOT SPECIFIED  Final   Special Requests   Final    BOTTLES DRAWN AEROBIC ONLY Blood Culture results may not be optimal due to an inadequate volume of blood received in culture bottles   Culture   Final    NO GROWTH 1 DAY Performed at [facility], [place] [address], [place], [place] [address]    Report Status PENDING  Incomplete  Culture, blood (routine x 2)     Status: None (Preliminary result)   Collection Time: 05/18/20 12:28 AM   Specimen: BLOOD  Result Value Ref Range Status   Specimen Description BLOOD  SITE NOT SPECIFIED  Final   Special Requests   Final    BOTTLES DRAWN AEROBIC ONLY Blood Culture results may not be optimal due to an inadequate volume of blood received in culture bottles   Culture   Final    NO GROWTH 1 DAY Performed at [facility], [place] [address] [place], [place] [address]    Report Status PENDING  Incomplete  MRSA PCR Screening     Status: None   Collection Time: 05/18/20  1:56 AM   Specimen: Nasal Mucosa; Nasopharyngeal  Result Value Ref Range Status   MRSA by PCR NEGATIVE NEGATIVE Final    Comment:        The GeneXpert MRSA Assay (FDA approved for NASAL specimens only), is one component of a comprehensive MRSA colonization surveillance program. It is not intended to diagnose MRSA infection nor to guide or monitor treatment for MRSA  infections. Performed at [facility], [place] [address], [place], [place] [address]       Studies: DG Chest 2 View  Result Date: 05/18/2020 CLINICAL DATA:  End-stage renal disease, dyspnea EXAM: CHEST - 2 VIEW COMPARISON:  05/17/2020 FINDINGS: Small to moderate left pleural effusion persists and is unchanged. The lungs are otherwise clear. No pneumothorax. No pleural effusion on the right. Cardiac size within normal limits. Pulmonary vascularity is normal. No acute bone abnormality. IMPRESSION: Stable small to moderate left pleural effusion. Electronically Signed   By: Fidela Salisbury MD   On: 05/18/2020 21:59    Scheduled Meds: . aspirin EC  81 mg Oral Daily  . Chlorhexidine Gluconate Cloth  6 each Topical Q0600  . cinacalcet  60 mg Oral Q supper  . gabapentin  300 mg Oral QHS  . heparin  5,000 Units Subcutaneous Q8H  . insulin aspart  0-6 Units Subcutaneous TID WC  . linagliptin  5 mg Oral Daily  . pantoprazole  40 mg Oral Daily  . sevelamer carbonate  1,600 mg Oral TID WC    Continuous Infusions: . cefTRIAXone (ROCEPHIN)  IV 1 g (05/18/20 2313)     LOS: 1 day     Kayleen Memos, MD [facility] Pager [phone]  If 7PM-7AM, please contact night-coverage [email or web address] Password [email or web address] 05/19/2020, 12:05 PM

## 2020-05-19 NOTE — Progress Notes (Signed)
[facility] Progress Note   Subjective:   Patient seen in room. Reports feeling much better compared to yesterday, but notes breathing is still not at baseline. On O2 2L. Also reports he vomited on HD yesterday which he thinks is because he just ate. No CP, palpitations, dizziness, abdominal pain or nausea.   Objective Vitals:   05/18/20 2206 05/18/20 2351 05/19/20 0509 05/19/20 0800  BP:  (!) 159/79 140/65 (!) 171/87  Pulse:  97 87 95  Resp:  16 17 18   Temp: 99.4 F (37.4 C) 99 F (37.2 C) 98.6 F (37 C) 97.7 F (36.5 C)  TempSrc: Oral Oral Oral   SpO2:  96% 90% 96%  Weight:   88.8 kg   Height:       Physical Exam General: Well developed male, alert and non-toxic appearing Heart: RRR, no murmurs, rubs or gallops Lungs: On O2 via [place], slightly increased WOB. Decreased breath sounds bilaterally but no wheezing or rales auscultated  Abdomen: Soft, non-tender, non-distended, +BS Extremities: L leg erythematous with 2+ edema, R leg trace pitting edema Dialysis Access: LUE AVF + bruit  Additional Objective Labs: Basic Metabolic Panel: Recent Labs  Lab 05/17/20 1400 05/18/20 0028 05/19/20 0558  NA 136 144 137  K 3.9 3.1* 4.4  CL 96* 109 101  CO2 30 23 28   GLUCOSE 191* 162* 151*  BUN 31* 23* 20  CREATININE 5.44* 4.55* 4.43*  CALCIUM 8.7* 6.5* 9.4  PHOS  --  3.4  --    Liver Function Tests: Recent Labs  Lab 05/17/20 1400  AST 19  ALT 17  ALKPHOS 408*  BILITOT 0.6  PROT 6.6  ALBUMIN 3.6   Recent Labs  Lab 05/17/20 1400  LIPASE 28   CBC: Recent Labs  Lab 05/17/20 1400 05/18/20 0028 05/19/20 0558  WBC 7.3 6.9 7.4  NEUTROABS 5.9  --   --   HGB 9.9* 8.2* 10.2*  HCT 31.5* 27.1* 34.2*  MCV 86.8 88.9 87.9  PLT 266 214 297   Blood Culture    Component Value Date/Time   SDES BLOOD SITE NOT SPECIFIED 05/18/2020 0028   SDES BLOOD SITE NOT SPECIFIED 05/18/2020 0028   SPECREQUEST  05/18/2020 0028    BOTTLES DRAWN AEROBIC ONLY Blood Culture  results may not be optimal due to an inadequate volume of blood received in culture bottles   SPECREQUEST  05/18/2020 0028    BOTTLES DRAWN AEROBIC ONLY Blood Culture results may not be optimal due to an inadequate volume of blood received in culture bottles   CULT  05/18/2020 0028    NO GROWTH 1 DAY Performed at [facility], [address], [place], [place] [address]    CULT  05/18/2020 0028    NO GROWTH 1 DAY Performed at [facility] [address], [place], [place] [address]    REPTSTATUS PENDING 05/18/2020 0028   REPTSTATUS PENDING 05/18/2020 0028   CBG: Recent Labs  Lab 05/18/20 0814 05/18/20 1157 05/18/20 1803 05/18/20 2117 05/19/20 0807  GLUCAP 139* 181* 147* 200* 160*   Studies/Results: DG Chest 2 View  Result Date: 05/18/2020 CLINICAL DATA:  End-stage renal disease, dyspnea EXAM: CHEST - 2 VIEW COMPARISON:  05/17/2020 FINDINGS: Small to moderate left pleural effusion persists and is unchanged. The lungs are otherwise clear. No pneumothorax. No pleural effusion on the right. Cardiac size within normal limits. Pulmonary vascularity is normal. No acute bone abnormality. IMPRESSION: Stable small to moderate left pleural effusion. Electronically Signed  By: Fidela Salisbury MD   On: 05/18/2020 21:59   US Venous Img Lower  Left (DVT Study)  Result Date: 05/17/2020 CLINICAL DATA:  Left lower calf dog scratch 2-3 weeks ago, now swollen and erythematous. EXAM: LEFT LOWER EXTREMITY VENOUS DOPPLER ULTRASOUND TECHNIQUE: Gray-scale sonography with compression, as well as color and duplex ultrasound, were performed to evaluate the deep venous system(s) from the level of the common femoral vein through the popliteal and proximal calf veins. COMPARISON:  None. FINDINGS: VENOUS Normal compressibility of the common femoral, superficial femoral, and popliteal veins, as well as the visualized calf veins. The peroneal vein is not well seen. Visualized portions of profunda femoral  vein and great saphenous vein unremarkable. No filling defects to suggest DVT on grayscale or color Doppler imaging. Doppler waveforms show normal direction of venous flow, normal respiratory plasticity and response to augmentation. Limited views of the contralateral common femoral vein are unremarkable. OTHER Multiple varicosities are noted in the leg without evidence of clot. There is edema throughout the calf. A lymph node in the left groin has a fatty hilum, but appears mildly enlarged. Limitations: none IMPRESSION: 1. No evidence of deep venous thrombosis within the left lower extremity. 2. Edema throughout the calf and mildly enlarged left groin lymph node. These findings likely reflect infection given the clinical history. Electronically Signed   By: Zerita Boers M.D.   On: 05/17/2020 16:10   DG Chest Port 1 View  Result Date: 05/17/2020 CLINICAL DATA:  Shortness of breath. EXAM: PORTABLE CHEST 1 VIEW COMPARISON:  04/21/2020 FINDINGS: Heart size is stable. Small left pleural effusion shows mild increase in size since previous study. Atelectasis or consolidation is seen in the left retrocardiac lung base. Right lung remains clear. IMPRESSION: Mild increase in size of small left pleural effusion. Left retrocardiac atelectasis versus consolidation. Electronically Signed   By: Marlaine Hind M.D.   On: 05/17/2020 14:34   Medications: . cefTRIAXone (ROCEPHIN)  IV 1 g (05/18/20 2313)   . aspirin EC  81 mg Oral Daily  . Chlorhexidine Gluconate Cloth  6 each Topical Q0600  . gabapentin  300 mg Oral QHS  . heparin  5,000 Units Subcutaneous Q8H  . insulin aspart  0-6 Units Subcutaneous TID WC  . linagliptin  5 mg Oral Daily  . pantoprazole  40 mg Oral Daily  . sevelamer carbonate  1,600 mg Oral TID WC    Outpatient Dialysis Orders: [facility] on MWF 180NRE, 4 hours, BFR 400, DFR 500, EDW 92kg, 2K/2.5Ca, AVF 15g, heparin 2600 unit bolus  Assessment/Plan: 1. Left lower leg cellulitis: On rocephin,  management per primary team.  2. ESRD: Dialyzes on MWF schedule, planned for extra HD today for volume, see below.  3. HTN/volume:  Volume overload improved but still with edema and requiring O2. Tolerated 3.5L UF with HD yesterday. Agrees to extra 3 hour treatment today for further volume removal.  4. Anemia: Hemoglobin 8.2 > 10.2. Received mircera 130mcg on 05/13/20.  5. Secondary hyperparathyroidism: Calcium level variable, now on the high side. Will resume sensipar. Continue renvela.    6. Nutrition:  Renal diet/fluid restrictions.  Anice Paganini, PA-C 05/19/2020, 8:28 AM  [facility] Pager: [phone]

## 2020-05-20 LAB — CBC
HCT: 35.9 % — ABNORMAL LOW (ref 39.0–52.0)
Hemoglobin: 10.9 g/dL — ABNORMAL LOW (ref 13.0–17.0)
MCH: 26.6 pg (ref 26.0–34.0)
MCHC: 30.4 g/dL (ref 30.0–36.0)
MCV: 87.6 fL (ref 80.0–100.0)
Platelets: 328 10*3/uL (ref 150–400)
RBC: 4.1 MIL/uL — ABNORMAL LOW (ref 4.22–5.81)
RDW: 16.1 % — ABNORMAL HIGH (ref 11.5–15.5)
WBC: 8.1 10*3/uL (ref 4.0–10.5)
nRBC: 0 % (ref 0.0–0.2)

## 2020-05-20 LAB — BASIC METABOLIC PANEL
Anion gap: 12 (ref 5–15)
BUN: 18 mg/dL (ref 8–23)
CO2: 28 mmol/L (ref 22–32)
Calcium: 9.3 mg/dL (ref 8.9–10.3)
Chloride: 96 mmol/L — ABNORMAL LOW (ref 98–111)
Creatinine, Ser: 4.61 mg/dL — ABNORMAL HIGH (ref 0.61–1.24)
GFR, Estimated: 14 mL/min — ABNORMAL LOW (ref >60)
Glucose, Bld: 183 mg/dL — ABNORMAL HIGH (ref 70–99)
Potassium: 4.4 mmol/L (ref 3.5–5.1)
Sodium: 136 mmol/L (ref 135–145)

## 2020-05-20 LAB — GLUCOSE, CAPILLARY
Glucose-Capillary: 187 mg/dL — ABNORMAL HIGH (ref 70–99)
Glucose-Capillary: 166 mg/dL — ABNORMAL HIGH (ref 70–99)

## 2020-05-20 LAB — BRAIN NATRIURETIC PEPTIDE: B Natriuretic Peptide: 929.7 pg/mL — ABNORMAL HIGH (ref 0.0–100.0)

## 2020-05-20 NOTE — Progress Notes (Signed)
PROGRESS NOTE  Edgar Brewer OZD:[id number] DOB: [birth year] DOA: 05/17/2020 PCP: Marin Olp, MD  Brief Summary: Edgar Brewer is a 60 y.o. male with medical history significant for ESRD on HD MWF, chronic diastolic CHF, type 2 diabetes mellitus, and chronic normocytic anemia, who presents to the ED with dyspnea at rest and left lower extremity cellulitis, failed outpatient treatment.  Had a superficial scratch from his dog on the posteromedial aspect of his lower left leg approximately 2 weeks prior to presentation with subsequent redness and swelling.  Treated with doxycycline and Augmentin outpatient unsuccessfully.  He developed shortness of breath over the past couple days, has not noted any fevers or chills, has not had any chest pain, and has not had much cough.  Reports that he completed his dialysis session on 05/15/2020, reports strict adherence with his fluid restrictions, but not to his salt intake.   Hypoxic on presentation, not on oxygen supplementation at baseline.  Volume overload on exam.  He was hemodialyzed on 05/18/2020.   Subjective:  Patient mentions that his left leg feels better.  Shortness of breath is improving.  Denies any chest pain.  No nausea or vomiting.   Assessment/Plan:  Acute respiratory failure with hypoxia/volume overload from end-stage renal disease/acute diastolic CHF Usually does not require oxygen at baseline.  Currently requiring 2 to 3 L.  Noted to have been weaned off oxygen this morning.  Underwent extra sessions of dialysis per nephrology. Seems to be improving gradually.  Will need ambulatory pulse oximetry.   Left lower extremity cellulitis, possibly from dog scratch at home, failed outpatient treatment. He was on doxycycline and Augmentin as outpatient. Left lower extremity was erythematous, edematous and warm Patient was placed on ceftriaxone with improvement in his left leg swelling.  Erythema is also improved.  Blood cultures have been  negative so far.  Patient also has underlying venous stasis dermatitis.  Lower extremity Doppler studies were negative for DVT.  Reactive lymph node was noted in the inguinal area.   Patient told to keep his legs elevated and use compression stockings when he goes home. Since patient failed Augmentin and doxycycline 1 strategy could be to convert to cephalexin at discharge.  ESRD on HD MWF Nephrology is following.  Patient was noted to be fluid overloaded.  He underwent extra sessions of dialysis.  Plan is for another session today.  Type 2 diabetes with hyperglycemia Hemoglobin A1c 7.2 on 05/18/2020. Continuous insulin sliding scale  Anemia of chronic disease in the setting of ESRD Drop in hemoglobin is dilutional.  No evidence of overt bleeding.  Continue to monitor.    Generalized weakness Seen by physical therapy.  No needs identified.    Code Status: Full code. DVT prophylaxis: Subcutaneous heparin Family Communication: Discussed with patient.  Mother at bedside. Disposition Plan: Discharge to prior environment.  Status is: Inpatient  Remains inpatient appropriate because:IV treatments appropriate due to intensity of illness or inability to take PO   Dispo:  Patient From: Home  Planned Disposition: Home  Medically stable for discharge: No        Consultants:  Nephrology.  Procedures:  Hemodialysis on 05/18/2020.  Antimicrobials:  Rocephin.     Objective: Vitals:   05/19/20 1955 05/19/20 2337 05/20/20 0406 05/20/20 0925  BP: 124/65 129/65 133/67 (!) 152/72  Pulse: 92 93 87 87  Resp: 15 16 18 20   Temp: 98.5 F (36.9 C)  98.7 F (37.1 C) 98.7 F (37.1 C)  TempSrc: Oral  Oral  Oral  SpO2: 95% 98% 99%   Weight:   86.6 kg   Height:        Intake/Output Summary (Last 24 hours) at 05/20/2020 1314 Last data filed at 05/19/2020 2200 Gross per 24 hour  Intake 388 ml  Output 3000 ml  Net -2612 ml   Filed Weights   05/19/20 1405 05/19/20 1715 05/20/20  0406  Weight: 89.8 kg 86.5 kg 86.6 kg    Exam:  General appearance: Awake alert.  In no distress Resp: Few crackles bilateral bases.  No wheezing or rhonchi.  Normal effort at rest. Cardio: S1-S2 is normal regular.  No S3-S4.  No rubs murmurs or bruit GI: Abdomen is soft.  Nontender nondistended.  Bowel sounds are present normal.  No masses organomegaly Extremities: Improved swelling and erythema of the left flank.  Venous stasis dermatitis noted. Neurologic: Alert and oriented x3.  No focal neurological deficits.      Data Reviewed: CBC: Recent Labs  Lab 05/17/20 1400 05/18/20 0028 05/19/20 0558 05/20/20 0411  WBC 7.3 6.9 7.4 8.1  NEUTROABS 5.9  --   --   --   HGB 9.9* 8.2* 10.2* 10.9*  HCT 31.5* 27.1* 34.2* 35.9*  MCV 86.8 88.9 87.9 87.6  PLT 266 214 297 938   Basic Metabolic Panel: Recent Labs  Lab 05/17/20 1400 05/18/20 0028 05/19/20 0558 05/20/20 0411  NA 136 144 137 136  K 3.9 3.1* 4.4 4.4  CL 96* 109 101 96*  CO2 30 23 28 28   GLUCOSE 191* 162* 151* 183*  BUN 31* 23* 20 18  CREATININE 5.44* 4.55* 4.43* 4.61*  CALCIUM 8.7* 6.5* 9.4 9.3  MG  --  1.5*  --   --   PHOS  --  3.4  --   --    GFR: Estimated Creatinine Clearance: 18 mL/min (A) (by C-G formula based on SCr of 4.61 mg/dL (H)). Liver Function Tests: Recent Labs  Lab 05/17/20 1400  AST 19  ALT 17  ALKPHOS 408*  BILITOT 0.6  PROT 6.6  ALBUMIN 3.6   Recent Labs  Lab 05/17/20 1400  LIPASE 28   HbA1C: Recent Labs    05/18/20 0028  HGBA1C 7.2*   CBG: Recent Labs  Lab 05/19/20 0807 05/19/20 1131 05/19/20 1739 05/19/20 2100 05/20/20 0826  GLUCAP 160* 138* 164* 267* 187*    Recent Results (from the past 240 hour(s))  SARS CORONAVIRUS 2 (TAT 6-24 HRS) Nasopharyngeal Nasopharyngeal Swab     Status: None   Collection Time: 05/17/20  5:45 PM   Specimen: Nasopharyngeal Swab  Result Value Ref Range Status   SARS Coronavirus 2 NEGATIVE NEGATIVE Final    Comment: (NOTE) SARS-CoV-2  target nucleic acids are NOT DETECTED.  The SARS-CoV-2 RNA is generally detectable in upper and lower respiratory specimens during the acute phase of infection. Negative results do not preclude SARS-CoV-2 infection, do not rule out co-infections with other pathogens, and should not be used as the sole basis for treatment or other patient management decisions. Negative results must be combined with clinical observations, patient history, and epidemiological information. The expected result is Negative.  Fact Sheet for Patients: SugarRoll.be  Fact Sheet for Healthcare Providers: https://www.woods-mathews.com/  This test is not yet approved or cleared by the [place] FDA and  has been authorized for detection and/or diagnosis of SARS-CoV-2 by FDA under an Emergency Use Authorization (EUA). This EUA will remain  in effect (meaning this test can be used) for the duration of the  COVID-19 declaration under Se ction 564(b)(1) of the Act, 21 U.S.C. section 360bbb-3(b)(1), unless the authorization is terminated or revoked sooner.  Performed at [facility], [place] [address], [place], [place] [address]   Culture, blood (routine x 2)     Status: None (Preliminary result)   Collection Time: 05/18/20 12:28 AM   Specimen: BLOOD  Result Value Ref Range Status   Specimen Description BLOOD SITE NOT SPECIFIED  Final   Special Requests   Final    BOTTLES DRAWN AEROBIC ONLY Blood Culture results may not be optimal due to an inadequate volume of blood received in culture bottles   Culture   Final    NO GROWTH 2 DAYS Performed at [facility], [place] [address], [place], [place] [address]    Report Status PENDING  Incomplete  Culture, blood (routine x 2)     Status: None (Preliminary result)   Collection Time: 05/18/20 12:28 AM   Specimen: BLOOD  Result Value Ref Range Status   Specimen Description BLOOD SITE NOT SPECIFIED  Final   Special  Requests   Final    BOTTLES DRAWN AEROBIC ONLY Blood Culture results may not be optimal due to an inadequate volume of blood received in culture bottles   Culture   Final    NO GROWTH 2 DAYS Performed at [facility], [place] [address], [place], [place] [address]    Report Status PENDING  Incomplete  MRSA PCR Screening     Status: None   Collection Time: 05/18/20  1:56 AM   Specimen: Nasal Mucosa; Nasopharyngeal  Result Value Ref Range Status   MRSA by PCR NEGATIVE NEGATIVE Final    Comment:        The GeneXpert MRSA Assay (FDA approved for NASAL specimens only), is one component of a comprehensive MRSA colonization surveillance program. It is not intended to diagnose MRSA infection nor to guide or monitor treatment for MRSA infections. Performed at [facility], [place] [address] [place], [place] [address]       Studies: No results found.  Scheduled Meds: . aspirin EC  81 mg Oral Daily  . Chlorhexidine Gluconate Cloth  6 each Topical Q0600  . cinacalcet  60 mg Oral Q supper  . gabapentin  300 mg Oral QHS  . heparin  5,000 Units Subcutaneous Q8H  . insulin aspart  0-6 Units Subcutaneous TID WC  . linagliptin  5 mg Oral Daily  . pantoprazole  40 mg Oral Daily  . sevelamer carbonate  1,600 mg Oral TID WC    Continuous Infusions: . cefTRIAXone (ROCEPHIN)  IV 1 g (05/19/20 2306)     LOS: 2 days     Bonnielee Haff, MD [facility] Pager: [email or web address]  If 7PM-7AM, please contact night-coverage [email or web address] Password [email or web address] 05/20/2020, 1:14 PM

## 2020-05-20 NOTE — Evaluation (Signed)
Occupational Therapy Evaluation and Discharge Patient Details Name: Edgar Brewer MRN: [id number] DOB: 19-Sep-1960 Today's Date: 05/20/2020    History of Present Illness Patient is a 60 y/o male who presents on 05/17/20 with SOB and LLE redness/swelling. CXR- small left pleural effusion and left retrocardiac atelectasis. Admitted with acute hypoxic respiratory failure and acute on chronic diastolic CHF as well as LLE cellulitis. PMH includes ESRD on HD, DM, CHF, HTN, CVA, neuropathy.   Clinical Impression   Pt was admitted to ED due to concerns listed below. PTA pt reported being independent in all ADL's and pt assists his mom in completion of IADL's. Pt's mom drives him for all community needs. During evaluation, pt demonstrated continued independence in all ADL's at the level he was able to complete them at home. Pt and mom were educated on energy conservation and the importance of mobility, pt agreeable to education. At this time pt needs no further OT services and will be discharged from OT.    Follow Up Recommendations  No OT follow up    Equipment Recommendations  None recommended by OT    Recommendations for Other Services       Precautions / Restrictions Precautions Precautions: Fall;Other (comment) Precaution Comments: watch 02 Restrictions Weight Bearing Restrictions: No      Mobility Bed Mobility Overal bed mobility: Modified Independent             General bed mobility comments: HOB elevated    Transfers Overall transfer level: Modified independent Equipment used: None Transfers: Sit to/from Stand Sit to Stand: Modified independent (Device/Increase time)         General transfer comment: Stood from EOB and toilet with no difficulty    Balance Overall balance assessment: Modified Independent;No apparent balance deficits (not formally assessed) Sitting-balance support: No upper extremity supported;Feet supported Sitting balance-Leahy Scale: Good      Standing balance support: No upper extremity supported Standing balance-Leahy Scale: Fair                             ADL either performed or assessed with clinical judgement   ADL Overall ADL's : Modified independent;At baseline                                       General ADL Comments: Pt completed toileting, simulated bathing while standing, and donned socks and hospital gown all with independence. Functional mobility was slow, however no LoB, 1 stumble but he caught himself, overall independent with some furniture walking.     Vision Baseline Vision/History: Retinopathy Patient Visual Report: No change from baseline Additional Comments: Vision impaired at baseline     Perception Perception Perception Tested?: No   Praxis Praxis Praxis tested?: Not tested    Pertinent Vitals/Pain Pain Assessment: No/denies pain     Hand Dominance Right   Extremity/Trunk Assessment Upper Extremity Assessment Upper Extremity Assessment: Overall WFL for tasks assessed   Lower Extremity Assessment Lower Extremity Assessment: Defer to PT evaluation   Cervical / Trunk Assessment Cervical / Trunk Assessment: Normal   Communication Communication Communication: No difficulties   Cognition Arousal/Alertness: Awake/alert Behavior During Therapy: WFL for tasks assessed/performed Overall Cognitive Status: History of cognitive impairments - at baseline  General Comments: pt follows commands well, participates in PT session appropriately   General Comments  Pt on RA, O2 maintained stable around 90-92%.    Exercises     Shoulder Instructions      Home Living Family/patient expects to be discharged to:: Private residence Living Arrangements: Parent Available Help at Discharge: Family;Available PRN/intermittently Type of Home: House Home Access: Stairs to enter [employer] of Steps: a  couple Entrance Stairs-Rails: Right Home Layout: One level     Bathroom Shower/Tub: Teacher, early years/pre: Standard Bathroom Accessibility: Yes How Accessible: Accessible via walker Home Equipment: Cane - single point;Walker - 2 wheels          Prior Functioning/Environment Level of Independence: Independent with assistive device(s)        Comments: Uses SPC for longer walks outside the house; mother drives him.        OT Problem List: Decreased strength;Decreased activity tolerance;Cardiopulmonary status limiting activity;Increased edema      OT Treatment/Interventions:      OT Goals(Current goals can be found in the care plan section) Acute Rehab OT Goals Patient Stated Goal: to go home OT Goal Formulation: All assessment and education complete, DC therapy  OT Frequency:     Barriers to D/C:            Co-evaluation              AM-PAC OT "6 Clicks" Daily Activity     Outcome Measure Help from another person eating meals?: None Help from another person taking care of personal grooming?: None Help from another person toileting, which includes using toliet, bedpan, or urinal?: None Help from another person bathing (including washing, rinsing, drying)?: None Help from another person to put on and taking off regular upper body clothing?: None Help from another person to put on and taking off regular lower body clothing?: None 6 Click Score: 24   End of Session Equipment Utilized During Treatment: Gait belt Nurse Communication: Mobility status  Activity Tolerance: Patient tolerated treatment well Patient left: in bed;with call bell/phone within reach;with family/visitor present  OT Visit Diagnosis: Unsteadiness on feet (R26.81);Muscle weakness (generalized) (M62.81)                Time: 6546-5035 OT Time Calculation (min): 23 min Charges:  OT General Charges $OT Visit: 1 Visit OT Evaluation $OT Eval Low Complexity: 1 Low OT  Treatments $Self Care/Home Management : 8-22 mins  Oneika Simonian H., OTR/L Acute Rehabilitation  Duff Pozzi Elane Darryel Diodato 05/20/2020, 1:50 PM

## 2020-05-20 NOTE — Progress Notes (Signed)
Physical Therapy Treatment Patient Details Name: Edgar Brewer MRN: [id number] DOB: July 19, 1960 Today's Date: 05/20/2020    History of Present Illness Patient is a 60 y/o male who presents on 05/17/20 with SOB and LLE redness/swelling. CXR- small left pleural effusion and left retrocardiac atelectasis. Admitted with acute hypoxic respiratory failure and acute on chronic diastolic CHF as well as LLE cellulitis. PMH includes ESRD on HD, DM, CHF, HTN, CVA, neuropathy.    PT Comments    Pt tolerates treatment well, not requiring supplemental oxygen and denying SOB with all mobility. Pt is able to ambulate for increased distances and mobilizes at a supervision level currently. Pt will benefit from continued acute PT services to assess stair negotiation, dynamic balance tasks, and to continue improving endurance. PT continues to recommend no PT or DME at the time of discharge.   Follow Up Recommendations  No PT follow up     Equipment Recommendations  None recommended by PT    Recommendations for Other Services       Precautions / Restrictions Precautions Precautions: Fall;Other (comment) Precaution Comments: watch 02 Restrictions Weight Bearing Restrictions: No    Mobility  Bed Mobility Overal bed mobility: Modified Independent             General bed mobility comments: HOB elevated    Transfers Overall transfer level: Needs assistance Equipment used: None Transfers: Sit to/from Stand Sit to Stand: Supervision            Ambulation/Gait Ambulation/Gait assistance: Supervision Gait Distance (Feet): 400 Feet Assistive device: None Gait Pattern/deviations: Step-through pattern Gait velocity: reduced Gait velocity interpretation: 1.31 - 2.62 ft/sec, indicative of limited community ambulator General Gait Details: pt with slowed step-through gait   Stairs             Wheelchair Mobility    Modified Rankin (Stroke Patients Only)       Balance Overall  balance assessment: Needs assistance Sitting-balance support: No upper extremity supported;Feet supported Sitting balance-Leahy Scale: Good     Standing balance support: No upper extremity supported Standing balance-Leahy Scale: Fair                              Cognition Arousal/Alertness: Awake/alert Behavior During Therapy: WFL for tasks assessed/performed Overall Cognitive Status: No family/caregiver present to determine baseline cognitive functioning                                 General Comments: pt follows commands well, participates in PT session appropriately      Exercises      General Comments General comments (skin integrity, edema, etc.): pt on 4L [place] upon arrival, weaned to room air with stable sats from 93-97% with activity throughout session. Pt left on RA at end of session, RN made aware      Pertinent Vitals/Pain Pain Assessment: No/denies pain    Home Living                      Prior Function            PT Goals (current goals can now be found in the care plan section) Acute Rehab PT Goals Patient Stated Goal: to go home Progress towards PT goals: Progressing toward goals    Frequency    Min 3X/week      PT Plan Current plan remains appropriate  Co-evaluation              AM-PAC PT "6 Clicks" Mobility   Outcome Measure  Help needed turning from your back to your side while in a flat bed without using bedrails?: None Help needed moving from lying on your back to sitting on the side of a flat bed without using bedrails?: None Help needed moving to and from a bed to a chair (including a wheelchair)?: A Little Help needed standing up from a chair using your arms (e.g., wheelchair or bedside chair)?: A Little Help needed to walk in hospital room?: A Little Help needed climbing 3-5 steps with a railing? : A Little 6 Click Score: 20    End of Session   Activity Tolerance: Patient tolerated  treatment well Patient left: in bed;with call bell/phone within reach;with family/visitor present Nurse Communication: Mobility status PT Visit Diagnosis: Unsteadiness on feet (R26.81);Difficulty in walking, not elsewhere classified (R26.2)     Time: 0947-1000 PT Time Calculation (min) (ACUTE ONLY): 13 min  Charges:  $Therapeutic Activity: 8-22 mins                     Zenaida Niece, PT, DPT Acute Rehabilitation Pager: [phone]    Zenaida Niece 05/20/2020, 10:28 AM

## 2020-05-20 NOTE — Progress Notes (Signed)
[facility] Progress Note   Subjective:   Asleep in chair at bedside, awakens to voice. Reports he is not feeling short of breath. Denies chest pain, abdominal pain and nausea. Reports he is comfortable, does not want to get back in bed.   Objective Vitals:   05/19/20 1741 05/19/20 1955 05/19/20 2337 05/20/20 0406  BP: 124/63 124/65 129/65 133/67  Pulse:  92 93 87  Resp: 20 15 16 18   Temp: 98.7 F (37.1 C) 98.5 F (36.9 C)  98.7 F (37.1 C)  TempSrc: Oral Oral  Oral  SpO2: 92% 95% 98% 99%  Weight:    86.6 kg  Height:       Physical Exam General: Well developed male, alert and non-toxic appearing Heart: RRR, no murmurs, rubs or gallops Lungs: On O2 via [place], respirations unlabored while seated upright, no accessory muscle use. Decreased breath sounds b/l bases but no wheezing or rales Abdomen: Soft, non-tender, non-distended, +BS Extremities: L leg erythematous with 1+ edema, R leg no edema Dialysis Access: LUE AVF + bruit  Additional Objective Labs: Basic Metabolic Panel: Recent Labs  Lab 05/18/20 0028 05/19/20 0558 05/20/20 0411  NA 144 137 136  K 3.1* 4.4 4.4  CL 109 101 96*  CO2 23 28 28   GLUCOSE 162* 151* 183*  BUN 23* 20 18  CREATININE 4.55* 4.43* 4.61*  CALCIUM 6.5* 9.4 9.3  PHOS 3.4  --   --    Liver Function Tests: Recent Labs  Lab 05/17/20 1400  AST 19  ALT 17  ALKPHOS 408*  BILITOT 0.6  PROT 6.6  ALBUMIN 3.6   Recent Labs  Lab 05/17/20 1400  LIPASE 28   CBC: Recent Labs  Lab 05/17/20 1400 05/18/20 0028 05/19/20 0558 05/20/20 0411  WBC 7.3 6.9 7.4 8.1  NEUTROABS 5.9  --   --   --   HGB 9.9* 8.2* 10.2* 10.9*  HCT 31.5* 27.1* 34.2* 35.9*  MCV 86.8 88.9 87.9 87.6  PLT 266 214 297 328   Blood Culture    Component Value Date/Time   SDES BLOOD SITE NOT SPECIFIED 05/18/2020 0028   SDES BLOOD SITE NOT SPECIFIED 05/18/2020 0028   SPECREQUEST  05/18/2020 0028    BOTTLES DRAWN AEROBIC ONLY Blood Culture results may not be  optimal due to an inadequate volume of blood received in culture bottles   SPECREQUEST  05/18/2020 0028    BOTTLES DRAWN AEROBIC ONLY Blood Culture results may not be optimal due to an inadequate volume of blood received in culture bottles   CULT  05/18/2020 0028    NO GROWTH 1 DAY Performed at [facility], [place] [address], [place], [place] [address]    CULT  05/18/2020 0028    NO GROWTH 1 DAY Performed at [facility] [address], [place], [place] [address]    REPTSTATUS PENDING 05/18/2020 0028   REPTSTATUS PENDING 05/18/2020 0028   CBG: Recent Labs  Lab 05/18/20 2117 05/19/20 0807 05/19/20 1131 05/19/20 1739 05/19/20 2100  GLUCAP 200* 160* 138* 164* 267*    Studies/Results: DG Chest 2 View  Result Date: 05/18/2020 CLINICAL DATA:  End-stage renal disease, dyspnea EXAM: CHEST - 2 VIEW COMPARISON:  05/17/2020 FINDINGS: Small to moderate left pleural effusion persists and is unchanged. The lungs are otherwise clear. No pneumothorax. No pleural effusion on the right. Cardiac size within normal limits. Pulmonary vascularity is normal. No acute bone abnormality. IMPRESSION: Stable small to moderate left pleural effusion. Electronically Signed  By: Fidela Salisbury MD   On: 05/18/2020 21:59   Medications: . cefTRIAXone (ROCEPHIN)  IV 1 g (05/19/20 2306)   . aspirin EC  81 mg Oral Daily  . Chlorhexidine Gluconate Cloth  6 each Topical Q0600  . cinacalcet  60 mg Oral Q supper  . gabapentin  300 mg Oral QHS  . heparin  5,000 Units Subcutaneous Q8H  . insulin aspart  0-6 Units Subcutaneous TID WC  . linagliptin  5 mg Oral Daily  . pantoprazole  40 mg Oral Daily  . sevelamer carbonate  1,600 mg Oral TID WC   Outpatient Dialysis Orders: [facility] on MWF 180NRE, 4 hours, BFR 400, DFR 500, EDW 92kg, 2K/2.5Ca, AVF 15g, heparin 2600 unit bolus  Assessment/Plan: 1.Left lower leg cellulitis: On rocephin, management per primary team. 2. ESRD:Dialyzes on MWF schedule, extra HD  on 4/26, see below.  3.HTN/volume:Volume overload improved but still requiring O2. Had extra HD yesterday, net UF 6L so far. Breathing appears improved but patient is also sitting upright. Planned for HD again today.  4. Anemia:Hemoglobin 10.9. Received mircera 180mcg on 05/13/20. 5. Secondary hyperparathyroidism:Calcium level variable, now on the high side. Resumed sensipar. Continue renvela. 6. Nutrition:Renal diet/fluid restrictions.  Anice Paganini, PA-C 05/20/2020, 8:22 AM  [facility] Pager: [phone]

## 2020-05-21 ENCOUNTER — Encounter (HOSPITAL_BASED_OUTPATIENT_CLINIC_OR_DEPARTMENT_OTHER): Payer: Medicare Other | Admitting: Internal Medicine

## 2020-05-21 LAB — GLUCOSE, CAPILLARY: Glucose-Capillary: 182 mg/dL — ABNORMAL HIGH (ref 70–99)

## 2020-05-21 MED ORDER — CEPHALEXIN 500 MG PO CAPS: 500.0000 mg | ORAL_CAPSULE | Freq: Two times a day (BID) | ORAL | 0 refills | Status: AC

## 2020-05-21 MED ORDER — CHLORHEXIDINE GLUCONATE CLOTH 2 % EX PADS: 6.0000 | MEDICATED_PAD | Freq: Every day | CUTANEOUS | Status: DC

## 2020-05-21 NOTE — Discharge Instructions (Addendum)
End-Stage Kidney Disease End-stage kidney disease occurs when the kidneys are so damaged that they cannot function and cannot get better. This condition may also be referred to as end-stage renal disease or ESRD. The kidneys are two organs that do many important jobs in the body, including:  Removing waste and extra fluid from the blood to make urine.  Making hormones that maintain the amount of fluid in tissues and blood vessels.  Maintaining the right amount of fluids and chemicals in the body. Without functioning kidneys, toxins build up in the blood, and life-threatening complications can occur. What are the causes? This condition usually occurs when a long-term (chronic) kidney disease gets worse and results in permanent damage to the kidneys. It may also be caused by sudden damage to the kidneys (acute kidney injury). What increases the risk? The following factors may make you more likely to develop this condition:  Having a family history of chronic kidney disease (CKD).  Chronic medical conditions that affect the kidneys, such as: ? Cardiovascular disease, including high blood pressure. ? Diabetes. ? Certain diseases that affect the body's disease-fighting system (immunesystem).  Smoking or a history of smoking.  Overuse of over-the-counter pain medicines.  Being around or being in contact with poisonous (toxic) substances. What are the signs or symptoms? Symptoms of this condition include:  Swelling of the face, legs, ankles, or feet.  Numbness, tingling, or loss of feeling in the hands or feet.  Tiredness (lethargy).  Nausea or vomiting.  Confusion, trouble concentrating, or loss of consciousness.  Chest pain.  Shortness of breath.  Passing little or no urine.  Muscle twitches and cramps, especially in the legs.  Skin changes, such as: ? Dry, itchy skin. ? Pale skin due to anemia, including the skin and tissue around the eye (conjunctiva). ? Abnormally dark  or light skin.  Loss of appetite.  Headaches.  Decrease in muscle size (muscle wasting).  Easy bruising.  Stopping of menstrual periods, in women.  Jerky movements (seizures). How is this diagnosed? This condition may be diagnosed based on:  A physical exam, including blood pressure measurements.  Urine tests.  Blood tests.  Imaging tests.  A test in which a sample of tissue is removed from the kidneys to be examined under a microscope (kidney biopsy). How is this treated? This condition may be treated with:  Dialysis, which is a procedure that removes toxic wastes from the body. There are two types of dialysis: ? Dialysis that is done through your abdomen. This is called peritoneal dialysis and may be done several times a day. ? Dialysis that is done by a machine. This is called hemodialysis and may be done several times a week.  Kidney transplant. This is surgery to receive a new kidney. In addition to having dialysis or a kidney transplant, you may need to take medicines:  To control high blood pressure (hypertension).  To control high cholesterol.  To treat diabetes.  To maintain healthy levels of minerals in the blood (electrolytes). You may also be given a specific meal plan to follow that includes requirements or limits for:  Salt (sodium).  Protein.  Phosphorus.  Potassium.  Calcium. Follow these instructions at home: Medicines  Take over-the-counter and prescription medicines only as told by your health care provider.  Do not take any new medicines, vitamins, or mineral supplements unless approved by your health care provider. Many medicines and supplements can worsen kidney damage.  Follow instructions from your health care provider about  adjusting the doses of any medicines you take. Lifestyle  Do not use any products that contain nicotine or tobacco, such as cigarettes, e-cigarettes, and chewing tobacco. If you need help quitting, ask your  health care provider.  Achieve and maintain a healthy weight. If you need help with this, ask your health care provider.  Start or continue an exercise plan. Exercise at least 30 minutes a day, 5 days a week.  Follow your prescribed meal plan. General instructions  Stay current with your shots (immunizations) as told by your health care provider.  Keep track of your blood pressure. Report changes in your blood pressure as told by your health care provider.  If you are being treated for diabetes, monitor and track your blood sugar (blood glucose) levels as told by your health care provider.  Keep all follow-up visits. This is important. Where to find more information  American Association of Kidney Patients: BombTimer.gl  National Kidney Foundation: www.kidney.[place]: https://mathis.com/  [facility]: www.lifeoptions.org  [facility] [employer]: www.kidneyschool.org Contact a health care provider if:  Your symptoms get worse.  You develop new symptoms. Get help right away if:  You have weakness in an arm or leg on one side of your body.  You have difficulty speaking or you are slurring your speech.  You have a sudden change in your vision.  You have a sudden, severe headache.  You have a sudden weight increase.  You have difficulty breathing.  Your symptoms suddenly get worse.  You have chest pain. Summary  End-stage kidney disease occurs when the kidneys are so damaged that they cannot function and cannot get better.  Without functioning kidneys, toxins build up in the blood and life-threatening complications can occur.  Treatment may include dialysis or a kidney transplant along with medicines and lifestyle changes. This information is not intended to replace advice given to you by your health care provider. Make sure you discuss any questions you have with your health care provider. Document Revised: 05/06/2019 Document Reviewed:  04/12/2019 Elsevier Patient Education  [place].

## 2020-05-21 NOTE — Progress Notes (Signed)
Pt is alert and oriented. Discharge instructions/ AVS given to pt. 

## 2020-05-21 NOTE — Discharge Summary (Signed)
[facility]  Physician Discharge Summary   Patient ID: Edgar Brewer MRN: [id number] DOB/AGE: 61/29/1962 61 y.o.  Admit date: 05/17/2020 Discharge date: 05/22/2020  PCP: Marin Olp, MD  DISCHARGE DIAGNOSES:  Volume overload due to end-stage renal disease Acute diastolic CHF Acute respiratory failure with hypoxia, resolved Left lower extremity cellulitis, resolved End-stage renal disease on hemodialysis, Monday Wednesday Friday Diabetes mellitus type 2 with hyperglycemia Anemia of chronic disease  RECOMMENDATIONS FOR OUTPATIENT FOLLOW UP: 1. Continue with hemodialysis schedule.   Home Health: None Equipment/Devices: None  CODE STATUS: Full code  DISCHARGE CONDITION: fair  Diet recommendation: As before  INITIAL HISTORY: Edgar Brewer a 60 y.o.malewith medical history significant forESRD on HD MWF, chronic diastolic CHF, type 2 diabetes mellitus, and chronic normocytic anemia, who presents to the ED with dyspnea at rest and left lower extremity cellulitis, failed outpatient treatment.  Had a superficial scratch from his dog on the posteromedial aspect of his lower left leg approximately 2 weeks prior to presentation with subsequent redness and swelling. Treated with doxycycline and Augmentin outpatient unsuccessfully.  He developed shortness of breath over the past couple days, has not noted any fevers or chills, has not had any chest pain, and has not had much cough. Reports that he completed his dialysis session on 05/15/2020, reports strict adherence with his fluid restrictions, but not to his salt intake.   Hypoxic on presentation, not on oxygen supplementation at baseline.  Volume overload on exam.  He was hemodialyzed on 05/18/2020.   Consultations:  Nephrology  Procedures:  Hemodialysis   HOSPITAL COURSE:   Acute respiratory failure with hypoxia/volume overload from end-stage renal disease/acute diastolic CHF Usually does not require oxygen  at baseline.  Was requiring 2 to 3 L of oxygen at admission.  Noted to be fluid overloaded.  After he underwent multiple dialysis sessions he improved.  Was weaned off of oxygen.  Left lower extremity cellulitis, possibly from dog scratch at home, failed outpatient treatment. He was on doxycycline and Augmentin as outpatient. Left lower extremity was erythematous, edematous and warm Patient was placed on ceftriaxone with improvement in his left leg swelling.  Erythema is also improved.  Blood cultures have been negative so far.  Patient also has underlying venous stasis dermatitis.  Lower extremity Doppler studies were negative for DVT.  Reactive lymph node was noted in the inguinal area.   Patient told to keep his legs elevated and use compression stockings when he goes home. Since patient failed Augmentin and doxycycline 1 strategy could be to convert to cephalexin at discharge.  ESRD on HD MWF Nephrology is following.  Patient was noted to be fluid overloaded.  He underwent extra sessions of dialysis  Type 2 diabetes with hyperglycemia Hemoglobin A1c 7.2 on 05/18/2020.  Anemia of chronic disease in the setting of ESRD Drop in hemoglobin is dilutional.  No evidence of overt bleeding.  Generalized weakness Seen by physical therapy.  No needs identified.    Okay for discharge home today.   PERTINENT LABS:  The results of significant diagnostics from this hospitalization (including imaging, microbiology, ancillary and laboratory) are listed below for reference.    Microbiology: Recent Results (from the past 240 hour(s))  SARS CORONAVIRUS 2 (TAT 6-24 HRS) Nasopharyngeal Nasopharyngeal Swab     Status: None   Collection Time: 05/17/20  5:45 PM   Specimen: Nasopharyngeal Swab  Result Value Ref Range Status   SARS Coronavirus 2 NEGATIVE NEGATIVE Final    Comment: (NOTE) SARS-CoV-2 target nucleic  acids are NOT DETECTED.  The SARS-CoV-2 RNA is generally detectable in upper and  lower respiratory specimens during the acute phase of infection. Negative results do not preclude SARS-CoV-2 infection, do not rule out co-infections with other pathogens, and should not be used as the sole basis for treatment or other patient management decisions. Negative results must be combined with clinical observations, patient history, and epidemiological information. The expected result is Negative.  Fact Sheet for Patients: SugarRoll.be  Fact Sheet for Healthcare Providers: https://www.woods-mathews.com/  This test is not yet approved or cleared by the [place] FDA and  has been authorized for detection and/or diagnosis of SARS-CoV-2 by FDA under an Emergency Use Authorization (EUA). This EUA will remain  in effect (meaning this test can be used) for the duration of the COVID-19 declaration under Se ction 564(b)(1) of the Act, 21 U.S.C. section 360bbb-3(b)(1), unless the authorization is terminated or revoked sooner.  Performed at [facility], [place] [address], [place], [place] [address]   Culture, blood (routine x 2)     Status: None (Preliminary result)   Collection Time: 05/18/20 12:28 AM   Specimen: BLOOD  Result Value Ref Range Status   Specimen Description BLOOD SITE NOT SPECIFIED  Final   Special Requests   Final    BOTTLES DRAWN AEROBIC ONLY Blood Culture results may not be optimal due to an inadequate volume of blood received in culture bottles   Culture   Final    NO GROWTH 4 DAYS Performed at [facility], [place] [address], [place], [place] [address]    Report Status PENDING  Incomplete  Culture, blood (routine x 2)     Status: None (Preliminary result)   Collection Time: 05/18/20 12:28 AM   Specimen: BLOOD  Result Value Ref Range Status   Specimen Description BLOOD SITE NOT SPECIFIED  Final   Special Requests   Final    BOTTLES DRAWN AEROBIC ONLY Blood Culture results may not be optimal due to an  inadequate volume of blood received in culture bottles   Culture   Final    NO GROWTH 4 DAYS Performed at [facility], [place] [address], [place], [place] [address]    Report Status PENDING  Incomplete  MRSA PCR Screening     Status: None   Collection Time: 05/18/20  1:56 AM   Specimen: Nasal Mucosa; Nasopharyngeal  Result Value Ref Range Status   MRSA by PCR NEGATIVE NEGATIVE Final    Comment:        The GeneXpert MRSA Assay (FDA approved for NASAL specimens only), is one component of a comprehensive MRSA colonization surveillance program. It is not intended to diagnose MRSA infection nor to guide or monitor treatment for MRSA infections. Performed at [facility], [place] [address], [place], [place] [address]      Labs:  COVID-19 Labs   Lab Results  Component Value Date   SARSCOV2NAA NEGATIVE 05/17/2020   [place] NEGATIVE 03/17/2020   [place] NEGATIVE 11/14/2018   [place] NEGATIVE 10/18/2018      Basic Metabolic Panel: Recent Labs  Lab 05/17/20 1400 05/18/20 0028 05/19/20 0558 05/20/20 0411  NA 136 144 137 136  K 3.9 3.1* 4.4 4.4  CL 96* 109 101 96*  CO2 30 23 28 28   GLUCOSE 191* 162* 151* 183*  BUN 31* 23* 20 18  CREATININE 5.44* 4.55* 4.43* 4.61*  CALCIUM 8.7* 6.5* 9.4 9.3  MG  --  1.5*  --   --  PHOS  --  3.4  --   --    Liver Function Tests: Recent Labs  Lab 05/17/20 1400  AST 19  ALT 17  ALKPHOS 408*  BILITOT 0.6  PROT 6.6  ALBUMIN 3.6   Recent Labs  Lab 05/17/20 1400  LIPASE 28   CBC: Recent Labs  Lab 05/17/20 1400 05/18/20 0028 05/19/20 0558 05/20/20 0411  WBC 7.3 6.9 7.4 8.1  NEUTROABS 5.9  --   --   --   HGB 9.9* 8.2* 10.2* 10.9*  HCT 31.5* 27.1* 34.2* 35.9*  MCV 86.8 88.9 87.9 87.6  PLT 266 214 297 328   BNP: BNP (last 3 results) Recent Labs    05/17/20 1400 05/20/20 0411  BNP 944.5* 929.7*    CBG: Recent Labs  Lab 05/19/20 1739 05/19/20 2100 05/20/20 0826 05/20/20 2031  05/21/20 0756  GLUCAP 164* 267* 187* 166* 182*     IMAGING STUDIES DG Chest 2 View  Result Date: 05/18/2020 CLINICAL DATA:  End-stage renal disease, dyspnea EXAM: CHEST - 2 VIEW COMPARISON:  05/17/2020 FINDINGS: Small to moderate left pleural effusion persists and is unchanged. The lungs are otherwise clear. No pneumothorax. No pleural effusion on the right. Cardiac size within normal limits. Pulmonary vascularity is normal. No acute bone abnormality. IMPRESSION: Stable small to moderate left pleural effusion. Electronically Signed   By: Fidela Salisbury MD   On: 05/18/2020 21:59   US Venous Img Lower  Left (DVT Study)  Result Date: 05/17/2020 CLINICAL DATA:  Left lower calf dog scratch 2-3 weeks ago, now swollen and erythematous. EXAM: LEFT LOWER EXTREMITY VENOUS DOPPLER ULTRASOUND TECHNIQUE: Gray-scale sonography with compression, as well as color and duplex ultrasound, were performed to evaluate the deep venous system(s) from the level of the common femoral vein through the popliteal and proximal calf veins. COMPARISON:  None. FINDINGS: VENOUS Normal compressibility of the common femoral, superficial femoral, and popliteal veins, as well as the visualized calf veins. The peroneal vein is not well seen. Visualized portions of profunda femoral vein and great saphenous vein unremarkable. No filling defects to suggest DVT on grayscale or color Doppler imaging. Doppler waveforms show normal direction of venous flow, normal respiratory plasticity and response to augmentation. Limited views of the contralateral common femoral vein are unremarkable. OTHER Multiple varicosities are noted in the leg without evidence of clot. There is edema throughout the calf. A lymph node in the left groin has a fatty hilum, but appears mildly enlarged. Limitations: none IMPRESSION: 1. No evidence of deep venous thrombosis within the left lower extremity. 2. Edema throughout the calf and mildly enlarged left groin lymph node.  These findings likely reflect infection given the clinical history. Electronically Signed   By: Zerita Boers M.D.   On: 05/17/2020 16:10   DG Chest Port 1 View  Result Date: 05/17/2020 CLINICAL DATA:  Shortness of breath. EXAM: PORTABLE CHEST 1 VIEW COMPARISON:  04/21/2020 FINDINGS: Heart size is stable. Small left pleural effusion shows mild increase in size since previous study. Atelectasis or consolidation is seen in the left retrocardiac lung base. Right lung remains clear. IMPRESSION: Mild increase in size of small left pleural effusion. Left retrocardiac atelectasis versus consolidation. Electronically Signed   By: Marlaine Hind M.D.   On: 05/17/2020 14:34    DISCHARGE EXAMINATION: Vitals:   05/20/20 1819 05/20/20 2029 05/21/20 0003 05/21/20 0528  BP: 132/64 132/69 140/74 128/79  Pulse: 92 92 99 87  Resp: 16 15  18   Temp: 98.6 F (37  C) 98.9 F (37.2 C) 99.2 F (37.3 C) 98.1 F (36.7 C)  TempSrc: Oral Oral Oral Oral  SpO2: 95% 92% 92%   Weight:    84.1 kg  Height:       General appearance: Awake alert.  In no distress Resp: Clear to auscultation bilaterally.  Normal effort Cardio: S1-S2 is normal regular.  No S3-S4.  No rubs murmurs or bruit GI: Abdomen is soft.  Nontender nondistended.  Bowel sounds are present normal.  No masses organomegaly Extremities: Left lower extremity erythema is improved   DISPOSITION: Home  Discharge Instructions    Call MD for:  difficulty breathing, headache or visual disturbances   Complete by: As directed    Call MD for:  extreme fatigue   Complete by: As directed    Call MD for:  persistant dizziness or light-headedness   Complete by: As directed    Call MD for:  persistant nausea and vomiting   Complete by: As directed    Call MD for:  redness, tenderness, or signs of infection (pain, swelling, redness, odor or green/yellow discharge around incision site)   Complete by: As directed    Call MD for:  severe uncontrolled pain   Complete  by: As directed    Call MD for:  temperature >100.4   Complete by: As directed    Diet - low sodium heart healthy   Complete by: As directed    Discharge instructions   Complete by: As directed    Please keep up with your regular dialysis schedule starting tomorrow.  Take your medications as prescribed.  Keep your legs elevated.  Use compression stockings during the daytime.  You were cared for by a hospitalist during your hospital stay. If you have any questions about your discharge medications or the care you received while you were in the hospital after you are discharged, you can call the unit and asked to speak with the hospitalist on call if the hospitalist that took care of you is not available. Once you are discharged, your primary care physician will handle any further medical issues. Please note that NO REFILLS for any discharge medications will be authorized once you are discharged, as it is imperative that you return to your primary care physician (or establish a relationship with a primary care physician if you do not have one) for your aftercare needs so that they can reassess your need for medications and monitor your lab values. If you do not have a primary care physician, you can call [phone] for a physician referral.   Increase activity slowly   Complete by: As directed    No wound care   Complete by: As directed         Allergies as of 05/21/2020      Reactions   Codeine Anaphylaxis, Hives, Swelling, Other (See Comments)   Swelling all over body and the throat   Clindamycin/lincomycin Nausea And Vomiting      Medication List    STOP taking these medications   amoxicillin-clavulanate 500-125 MG tablet Commonly known as: Augmentin     TAKE these medications   aspirin EC 81 MG tablet Take 81 mg by mouth daily. Swallow whole.   b complex-vitamin c-folic acid 0.8 MG Tabs tablet Take 1 tablet by mouth daily.   cephALEXin 500 MG capsule Commonly known as:  KEFLEX Take 1 capsule (500 mg total) by mouth 2 (two) times daily for 5 days.   cinacalcet 60 MG tablet Commonly  known as: SENSIPAR Take 60 mg by mouth See admin instructions. Take one tablet by mouth on Tues,Thurs Saturday and sundays only per patient   gabapentin 300 MG capsule Commonly known as: NEURONTIN Take 1 capsule (300 mg total) by mouth at bedtime.   linagliptin 5 MG Tabs tablet Commonly known as: TRADJENTA Take 1 tablet (5 mg total) by mouth daily.   omeprazole 40 MG capsule Commonly known as: PRILOSEC Take 1 capsule (40 mg total) by mouth daily. Hold omeprazole 20 mg while taking omeprazole 40mg  dose for 1 month What changed: additional instructions   sevelamer carbonate 800 MG tablet Commonly known as: RENVELA Take 2 tablets (1,600 mg total) by mouth 3 (three) times daily with meals.         Follow-up Information    Marin Olp, MD. Schedule an appointment as soon as possible for a visit in 1 week.   Specialty: Family Medicine Contact information: [place] [address] [phone]               TOTAL DISCHARGE TIME: 26 minutes   [place]  [facility] Pager on [email or web address]  05/22/2020, 11:31 AM

## 2020-05-21 NOTE — Progress Notes (Signed)
[facility] Progress Note   Subjective:   Seen in room this AM, sleeping but awakens to voice. Now on RA and denies SOB, CP, palpitations and dizziness. No pain at present. Net UF 2.5L with HD yesterday.   Objective Vitals:   05/20/20 1819 05/20/20 2029 05/21/20 0003 05/21/20 0528  BP: 132/64 132/69 140/74 128/79  Pulse: 92 92 99 87  Resp: 16 15  18   Temp: 98.6 F (37 C) 98.9 F (37.2 C) 99.2 F (37.3 C) 98.1 F (36.7 C)  TempSrc: Oral Oral Oral Oral  SpO2: 95% 92% 92%   Weight:    84.1 kg  Height:       Physical Exam General:Well developed male, non-toxic appearing Heart:RRR, no murmurs, rubs or gallops Lungs:On RA, respirations unlabored. Lungs CTA without wheezing, rhonchi or rales Abdomen:Soft, non-tender, non-distended, +BS Extremities:L leg erythematous with 1+ edema, R leg no edema Dialysis Access:LUE AVF + bruit   Additional Objective Labs: Basic Metabolic Panel: Recent Labs  Lab 05/18/20 0028 05/19/20 0558 05/20/20 0411  NA 144 137 136  K 3.1* 4.4 4.4  CL 109 101 96*  CO2 23 28 28   GLUCOSE 162* 151* 183*  BUN 23* 20 18  CREATININE 4.55* 4.43* 4.61*  CALCIUM 6.5* 9.4 9.3  PHOS 3.4  --   --    Liver Function Tests: Recent Labs  Lab 05/17/20 1400  AST 19  ALT 17  ALKPHOS 408*  BILITOT 0.6  PROT 6.6  ALBUMIN 3.6   Recent Labs  Lab 05/17/20 1400  LIPASE 28   CBC: Recent Labs  Lab 05/17/20 1400 05/18/20 0028 05/19/20 0558 05/20/20 0411  WBC 7.3 6.9 7.4 8.1  NEUTROABS 5.9  --   --   --   HGB 9.9* 8.2* 10.2* 10.9*  HCT 31.5* 27.1* 34.2* 35.9*  MCV 86.8 88.9 87.9 87.6  PLT 266 214 297 328   Blood Culture    Component Value Date/Time   SDES BLOOD SITE NOT SPECIFIED 05/18/2020 0028   SDES BLOOD SITE NOT SPECIFIED 05/18/2020 0028   SPECREQUEST  05/18/2020 0028    BOTTLES DRAWN AEROBIC ONLY Blood Culture results may not be optimal due to an inadequate volume of blood received in culture bottles   SPECREQUEST   05/18/2020 0028    BOTTLES DRAWN AEROBIC ONLY Blood Culture results may not be optimal due to an inadequate volume of blood received in culture bottles   CULT  05/18/2020 0028    NO GROWTH 3 DAYS Performed at [facility] [address] [place], [place] [address]    CULT  05/18/2020 0028    NO GROWTH 3 DAYS Performed at [facility] [address], [place], [place] [address]    REPTSTATUS PENDING 05/18/2020 0028   REPTSTATUS PENDING 05/18/2020 0028   CBG: Recent Labs  Lab 05/19/20 1739 05/19/20 2100 05/20/20 0826 05/20/20 2031 05/21/20 0756  GLUCAP 164* 267* 187* 166* 182*   Medications: . cefTRIAXone (ROCEPHIN)  IV Stopped (05/21/20 0037)   . aspirin EC  81 mg Oral Daily  . Chlorhexidine Gluconate Cloth  6 each Topical Q0600  . cinacalcet  60 mg Oral Q supper  . gabapentin  300 mg Oral QHS  . heparin  5,000 Units Subcutaneous Q8H  . insulin aspart  0-6 Units Subcutaneous TID WC  . linagliptin  5 mg Oral Daily  . pantoprazole  40 mg Oral Daily  . sevelamer carbonate  1,600 mg Oral TID WC   OutpatientDialysis  Orders: [facility] on MWF 180NRE, 4 hours, BFR 400, DFR 500, EDW 92kg, 2K/2.5Ca, AVF 15g, heparin 2600 unit bolus  Assessment/Plan: 1.Left lower leg cellulitis: On rocephin, management per primary team. 2. ESRD:Dialyzes on MWF schedule,extra HD on 4/26, see below. Next HD tomorrow (Friday)  3.HTN/volume:Volume overloadimproved, now on RA.Had extra HD yesterday, net UF 8.5L so far and is well below his outpatient EDW.   4. Anemia:Hemoglobin 10.9. Received mircera 154mcg on 05/13/20. 5. Secondary hyperparathyroidism:Calciumlevel variable, now on the high side. Resumed sensipar.Continue renvela. 6. Nutrition:Renal diet/fluid restrictions.  Anice Paganini, PA-C 05/21/2020, 8:15 AM  [facility] Pager: [phone]

## 2020-05-22 ENCOUNTER — Telehealth: Payer: Self-pay

## 2020-05-22 NOTE — Telephone Encounter (Signed)
Noted thanks °

## 2020-05-22 NOTE — Telephone Encounter (Cosign Needed)
Transition Care Management Follow-up Telephone Call  Date of discharge and from where: [facility] 05/21/20  How have you been since you were released from the hospital? [facility]  Any questions or concerns? No  Items Reviewed:  Did the pt receive and understand the discharge instructions provided? Yes   Medications obtained and verified? Yes   Other? No   Any new allergies since your discharge? No   Dietary orders reviewed? Yes  Do you have support at home? Yes   Home Care and Equipment/Supplies: Were home health services ordered? not applicable If so, what is the name of the agency?   Has the agency set up a time to come to the patient's home? not applicable Were any new equipment or medical supplies ordered?  No What is the name of the medical supply agency?  Were you able to get the supplies/equipment? not applicable Do you have any questions related to the use of the equipment or supplies? No  Functional Questionnaire: (I = Independent and D = Dependent) ADLs: I  Bathing/Dressing- I  Meal Prep- I  Eating- I  Maintaining continence- I  Transferring/Ambulation- I  Managing Meds- I  Follow up appointments reviewed:   PCP Hospital f/u appt confirmed? Yes  Scheduled to see Dr Yong Channel  on May 12th  @ 10a.m.  [facility] f/u appt confirmed? No    Are transportation arrangements needed? No   If their condition worsens, is the pt aware to call PCP or go to the Emergency Dept.? Yes  Was the patient provided with contact information for the PCP's office or ED? Yes  Was to pt encouraged to call back with questions or concerns? Yes

## 2020-05-23 LAB — CULTURE, BLOOD (ROUTINE X 2)
Culture: NO GROWTH
Culture: NO GROWTH

## 2020-06-03 LAB — CBC AND DIFFERENTIAL
Hemoglobin: 12.5 — AB (ref 13.5–17.5)
Platelets: 277 (ref 150–399)
WBC: 6.7

## 2020-06-03 LAB — BASIC METABOLIC PANEL
Potassium: 4.9 (ref 3.4–5.3)
Sodium: 139 (ref 137–147)

## 2020-06-03 LAB — IRON,TIBC AND FERRITIN PANEL: Ferritin: 901

## 2020-06-03 LAB — COMPREHENSIVE METABOLIC PANEL: Albumin: 3.9 (ref 3.5–5.0)

## 2020-06-03 NOTE — Patient Instructions (Addendum)
  Health Maintenance Due  Topic Date Due  . OPHTHALMOLOGY EXAM Needs to reschedule.  Never done   Sign release of information at the check out desk for lasb from yesterday from dialysis  Glad you are doing so much better- let us know if have worsening redness on the legs or other concerns.   Since wound has healed can cancel wound care visit - make sure to give them several days notice  Recommended follow up: cancel visit on 06/25/20 and reschedule for around for around 3 months from now- sooner if needed

## 2020-06-03 NOTE — Telephone Encounter (Signed)
Edgar Brewer, I believe this was the visit we discussed a few weeks ago.

## 2020-06-04 ENCOUNTER — Ambulatory Visit (INDEPENDENT_AMBULATORY_CARE_PROVIDER_SITE_OTHER): Payer: Medicare Other | Admitting: Family Medicine

## 2020-06-04 ENCOUNTER — Encounter: Payer: Self-pay | Admitting: Family Medicine

## 2020-06-04 ENCOUNTER — Other Ambulatory Visit: Payer: Self-pay

## 2020-06-04 VITALS — BP 138/70 | HR 84 | Temp 97.8°F | Ht 68.0 in | Wt 188.4 lb

## 2020-06-04 DIAGNOSIS — N186 End stage renal disease: Secondary | ICD-10-CM | POA: Diagnosis not present

## 2020-06-04 DIAGNOSIS — E119 Type 2 diabetes mellitus without complications: Secondary | ICD-10-CM

## 2020-06-04 DIAGNOSIS — E1140 Type 2 diabetes mellitus with diabetic neuropathy, unspecified: Secondary | ICD-10-CM

## 2020-06-04 DIAGNOSIS — G4719 Other hypersomnia: Secondary | ICD-10-CM

## 2020-06-04 DIAGNOSIS — I5032 Chronic diastolic (congestive) heart failure: Secondary | ICD-10-CM

## 2020-06-04 DIAGNOSIS — Z992 Dependence on renal dialysis: Secondary | ICD-10-CM

## 2020-06-04 NOTE — Progress Notes (Signed)
Phone [phone]   Subjective:  Edgar Brewer is a 60 y.o. year old very pleasant male patient who presents for transitional care management and hospital follow up for acute diastolic CHF/volume overload with acute respiratory failure/hypoxia. Patient was hospitalized from 05/17/2020 to 05/22/2020. A TCM phone call was completed on April 29. Medical complexity moderate  He was seen in the ED to 05/17/20 for shortness of breath and left lower extremity swelling and redness. He had a superficial scratch from his dog on the posteromedial aspect of his lower leg leg that happened 2 weeks ago that developed to redness and swelling. He has used Doxycycline and Augmentin-wound healed however he noted having consistent redness and swelling in that area. He developed shortness of breath 2 days later with some occasional vomiting episodes. No fever, chills, abdominal pain, chest pain, or headache was noted. He went to the ED for further evaluation.  He was noted to be hypoxic on presentation-patient not typically oxygen dependent.  He was noted to be volume overloaded.Labs were taken, K+ was normal, BUN 31, creatinine 5.44-of note patient on dialysis,, alkaline phosphatase 408 which is comparable to baseline, hemoglobin 9.9, troponin 18, and BNP 945.   Chest X-ray showed mild increase in size of small left pleural effusion, left retrocardiac atelectasis versus consolidation.  I independently reviewed initial chest x-ray on 05/17/2020 with the following findings-only 1 view available, airway normal, no obvious bony abnormalities, cardiac silhouette obscured on the left due to pleural effusion, pleural effusion noted in left lower lung field, diaphragm normal on the right but obscured on the left due to effusion  Left lower extremity venous dopplers showed no evidence of venous thrombosis within left lower extremity, concern for infection given some edema in the calf and mildly enlarged left groin lymph  nodes.  Today patient denies any worsening swelling or redness of the leg.  Breathing issues have resolved- not requiring oxygen since hospitalization   See problem oriented charting as well  Past Medical History-  Patient Active Problem List   Diagnosis Date Noted  . History of stroke 02/25/2020    Priority: High  . ESRD (end stage renal disease) on dialysis ([place]) 10/03/2018    Priority: High  . Diabetes mellitus type II, non insulin dependent ([place]) 01/14/2015    Priority: High  . Chronic diastolic CHF (congestive heart failure) ([place]) 01/13/2015    Priority: High  . GERD (gastroesophageal reflux disease) 03/25/2020    Priority: Medium  . Aortic stenosis 02/25/2020    Priority: Medium  . Type 2 diabetes mellitus with diabetic neuropathy, unspecified ([place]) 02/25/2020    Priority: Medium  . Gallbladder abscess 11/15/2018    Priority: Medium  . Elevated LFTs 10/18/2018    Priority: Medium  . Other disorders of phosphorus metabolism 01/19/2018    Priority: Medium  . Secondary hyperparathyroidism of renal origin ([place]) 09/27/2017    Priority: Medium  . Hypertension associated with diabetes ([place]) 01/14/2015    Priority: Medium  . Anemia of chronic kidney failure 01/14/2015    Priority: Medium  . Varicose veins of lower extremities with complications 09/62/8366    Priority: Medium  . CKD (chronic kidney disease), stage IV ([place]) 01/14/2015    Priority: Low  . Obesity 01/14/2015    Priority: Low  . Chronic venous insufficiency 10/22/2014    Priority: Low  . Acute respiratory failure with hypoxia ([place]) 05/17/2020  . Wound of left leg 05/08/2020  . Left leg cellulitis 05/08/2020    Medications- reviewed and  updated  A medical reconciliation was performed comparing current medicines to hospital discharge medications. Current Outpatient Medications  Medication Sig Dispense Refill  . aspirin EC 81 MG tablet Take 81 mg by mouth daily. Swallow whole.    . b complex-vitamin c-folic  acid (NEPHRO-VITE) 0.8 MG TABS tablet Take 1 tablet by mouth daily.    . cinacalcet (SENSIPAR) 60 MG tablet Take 60 mg by mouth See admin instructions. Take one tablet by mouth on Tues,Thurs Saturday and sundays only per patient    . linagliptin (TRADJENTA) 5 MG TABS tablet Take 1 tablet (5 mg total) by mouth daily. 90 tablet 3  . omeprazole (PRILOSEC) 40 MG capsule Take 1 capsule (40 mg total) by mouth daily. Hold omeprazole 20 mg while taking omeprazole 40mg  dose for 1 month (Patient taking differently: Take 40 mg by mouth daily.) 30 capsule 0  . sevelamer carbonate (RENVELA) 800 MG tablet Take 2 tablets (1,600 mg total) by mouth 3 (three) times daily with meals.     No current facility-administered medications for this visit.   Objective  Objective:  BP 138/70   Pulse 84   Temp 97.8 F (36.6 C) (Temporal)   Ht 5\' 8"  (1.727 m)   Wt 188 lb 6.4 oz (85.5 kg)   SpO2 97%   BMI 28.65 kg/m  Gen: NAD, resting comfortably CV: regular heart rate, no murmurs rubs or gallops Lungs: CTAB no crackles, wheeze, rhonchi Abdomen: soft/nontender/nondistended/normal bowel sounds. No rebound or guarding.  Ext: Trace edema on the left leg, mild erythema to approximately 10 cm below tibial plateau, no warmth or tenderness other than mild tenderness noted in the left lower ankle.  No edema on the right lower exertemity with mild venousstasis changes,  Skin: warm, dry Neuro: grossly normal, moves all extremities  Diabetic Foot Exam - Simple   Simple Foot Form Diabetic Foot exam was performed with the following findings: Yes 06/04/2020 10:31 AM  Visual Inspection No deformities, no ulcerations, no other skin breakdown bilaterally: Yes Sensation Testing See comments: Yes Pulse Check Posterior Tibialis and Dorsalis pulse intact bilaterally: Yes Comments Does have slight callous at base of 1st MTP on left. Does not feel monofilament at toes or forefoot but present through rest of foot      Assessment  and Plan:   #Hospital follow-up forHeart failure with preserved ejection fraction-follows with Dr. [name].  Patient with acute respiratory failure with hypoxia due to volume overload S: Medication: Managed in the hospital with multiple dialysis sessions.  He was able to be weaned off of oxygen  Edema: Improved Weight gain:No weight gain since getting out the hospital Shortness of breath: No shortness of breath since getting out of the hospital  Orthopnea/PND: No difficulty breathing noted while lying down   A/P: Heart failure with preserved ejection fraction now stable after multiple dialysis sessions and getting extra fluid off.  Not completely clear what triggered the episode.  Patient is continuing thrice weekly dialysis.  -Patient did have dilutional anemia from fluid excess down to 8.2-improved during hospitalization.  Back to baseline by discharge 10.9 for hemoglobin -Patient did have generalized weakness-he was seen by physical therapy and no needs were discovered  #Cellulitis-patient also treated outpatient prior to hospitalization for cellulitis with doxycycline and Augmentin unsuccessfully-patient reports ceftriaxone during hospitalization-erythema/swelling improved.  Blood cultures were negative.  DVT was ruled out.  Patient was advised to elevate the legs and use compression stockings at home.  Patient was transitioned to Keflex outpatient since he  failed Augmentin and doxycycline. Has since improved since his hospital visit.  -Patient previously had a nonhealing wound and plan was for follow-up with wound care center- wound has now resolved and patient plans to cancel visit with wound care  #End-stage renal disease on hemodialysis since January 2019. thought related to diabtes #Secondary hyperparathyroidism S: Patient on dialysis followed by Dr. Lorrene Reid on MWF at [facility] site  -AV fistula 06/01/2016 by Dr. Donnetta Hutching A/P: Overall appears stable-continue dialysis Monday Wednesday Friday as  primary treatment for fluid overload -Patient compliant with Sensipar for secondary hyperparathyroidism -On Renvela phosphate binder  #hypertension S: medication: None Home readings #s: No at home blood pressure taken A/P: High normal systolic readings but overall reasonable controlled, continue without  medication     #Aortic stenosis S:Last echocardiogram October 2020-reported as very mild considering fistula A/P: Would be reasonable to repea every 2 years or so-October 2022 or later is reasonable   # Diabetes with neuropathy S: Medication:Tradjenta 5 mg, gabapentin 300mg  BID-Taking 300 in the morning and 300 in the evening of gabapentin Lab Results  Component Value Date   HGBA1C 7.2 (H) 05/18/2020   HGBA1C 8.2 (H) 02/25/2020   HGBA1C 7.3 (H) 11/15/2018  A/P: Reasonable control-would prefer A1c was 7.5 if possible and was at goal during hospitalization. Continue the Tradjenta, but discontinuing the gabapentin-patient prefers not to take gabapentin just once a day as he does not find it helpful-was helpful to twice a day-this is above the limits for dialysis though-I would be willing to write this but only if I get a letter from his nephrologist  #history of stroke- now on aspirin 81mg - stutter lingers from this #hyperlipidemia S: Medication: states not on statin as of 2022-we attempted to start rosuvastatin in the past Lab Results  Component Value Date   CHOL 212 (H) 02/25/2020   HDL 38.40 (L) 02/25/2020   LDLDIRECT 64.0 02/25/2020   TRIG (H) 02/25/2020    433.0 Triglyceride is over 400; calculations on Lipids are invalid.   CHOLHDL 6 02/25/2020    A/P: Patient opted not to take rosuvastatin previously-we will reinforce at next visit  # Excessive Daytime Sleeping S: For 3 months he has been sleeping excessively per mother, she denies hearing any snoring while he is sleeping  A/P: Patient and mother concerned about OSA-referral to Pulmonology for further  evaluation  Recommended follow up: Recommended scheduling visit in a few weeks out to 3 months or sooner if needed Future Appointments  Date Time Provider [place]  06/16/2020  1:15 PM Ricard Dillon, MD [facility]  08/11/2020  2:00 PM Josue Hector, MD [facility]  09/08/2020  3:40 PM Yong Channel, Brayton Mars, MD [facility]   Lab/Order associations:   ICD-10-CM   1. Chronic diastolic CHF (congestive heart failure) (HCC)  I50.32   2. ESRD (end stage renal disease) on dialysis ([place])  N18.6    Z99.2   3. Diabetes mellitus type II, non insulin dependent (HCC)  E11.9   4. Type 2 diabetes mellitus with diabetic neuropathy, without long-term current use of insulin (HCC)  E11.40   5. Excessive daytime sleepiness  G47.19 Ambulatory referral to Pulmonology    Time Spent: 42 minutes of total time (10:11 AM- 10:53 AM) was spent on the date of the encounte performing the following actions: chart review prior to seeing the patient, obtaining history, performing a medically necessary exam, counseling on the treatment plan, placing orders, and documenting in our EHR.   - This  was originally planned as TCM visit but overall did not meet qualifications for such  [employer] as a scribe for Garret Reddish, MD.,have documented all relevant documentation on the behalf of Garret Reddish, MD,as directed by  Garret Reddish, MD while in the presence of Garret Reddish, MD.   I, Garret Reddish, MD, have reviewed all documentation for this visit. The documentation on 06/04/20 for the exam, diagnosis, procedures, and orders are all accurate and complete.   Return precautions advised.  Garret Reddish, MD

## 2020-06-09 ENCOUNTER — Encounter: Payer: Self-pay | Admitting: Family Medicine

## 2020-06-16 ENCOUNTER — Encounter (HOSPITAL_BASED_OUTPATIENT_CLINIC_OR_DEPARTMENT_OTHER): Payer: Medicare Other | Admitting: Internal Medicine

## 2020-06-17 ENCOUNTER — Telehealth: Payer: Self-pay

## 2020-06-17 MED ORDER — OMEPRAZOLE 40 MG PO CPDR: 40.0000 mg | DELAYED_RELEASE_CAPSULE | Freq: Every day | ORAL | 0 refills | Status: DC

## 2020-06-17 NOTE — Telephone Encounter (Signed)
MEDICATION: omeprazole (PRILOSEC) 40 MG capsule  PHARMACY: [facility] on [address]   Comments:   **Let patient know to contact pharmacy at the end of the day to make sure medication is ready. **  ** Please notify patient to allow 48-72 hours to process**  **Encourage patient to contact the pharmacy for refills or they can request refills through [facility]**

## 2020-06-17 NOTE — Telephone Encounter (Signed)
Refill sent.

## 2020-06-25 ENCOUNTER — Ambulatory Visit: Payer: Medicare Other | Admitting: Family Medicine

## 2020-08-02 NOTE — Progress Notes (Signed)
CARDIOLOGY OFFICE NOTE  Date:  08/02/2020    Edgar Brewer Date of Birth: 11/25/1960 Medical Record #[id number]  PCP:  Marin Olp, MD  Cardiologist:  [name]  No chief complaint on file.   History of Present Illness: Edgar Brewer is a 60 y.o. male who presents today for f/u diastolic CHF and mild AS He has CRF with Cr 7.4 Now on dialysis  Has left brachio cephalic fistula. Also with anemia, DM, neuropathy and LE Varicosities. History of anemia due to CRF EGD 04/05/16 duodenitis and colon with multiple polyps removed and Diverticular disease.   TTE Reviewed 11/07/17 EF 60-65% mild AS mean gradient 11 mmHg mild MR He had f/u TEE/TTE 10/24 and 11/19/18  when in hospital for bacteremia and commented On only AV sclerosis and not stenosis  He is a big Electrical engineer to talk sports and thinks Alvino Blood stinks   Had laparoscopic cholecystectomy on 10/04/18 readmitted with ? Seroma vs biloma or abscess started on antibiotics HIDA negative for bile leak but outpatient still with elevated alk phos and ESR 97 Seeing Dr Silverio Decamp Readmitted for percutaneous drainage and more antibiotic Rx October 2020  05/22/20 admitted with dyspnea and ? LLE cellulitis Troponin 18 BNP 945 Dialysis intensified Venous duplex Negative for DVT CXR small left pleural effusion Cellulitis RX with Augmentin and Doxycycline Cardiology did  Not see patient during hospitalization   He denies chest pain Breathing better since d/c  Very poor vision doesn't drive and has hard time reading     Past Medical History:  Diagnosis Date   Acute on chronic combined systolic and diastolic CHF, NYHA class 1 ([place]) 01/13/2015   Allergy    Arthritis    Chronic kidney disease    MWF Edgar Brewer.   Diabetes mellitus type 2 with complications (Edgar Brewer)    Previous insulin, non-insulin requiring noted 09/2018.  Complications include retinopathy, peripheral neuropathy.   GERD (gastroesophageal reflux disease)    at times   Head  injury with loss of consciousness (Edgar Brewer)    History of kidney stones    Hypertension    Neuromuscular disorder ([place])    Neuropathy    Pulmonary hypertension (Edgar Brewer)    PA peak pressure 41 mmHg 03/04/16 echo   Stroke [facility])    Varicose veins of lower extremities with complications 63/14/9702    Past Surgical History:  Procedure Laterality Date   AV FISTULA PLACEMENT Left 06/01/2016   Procedure: ARTERIOVENOUS (AV) FISTULA CREATION;  Surgeon: [name], MD;  Location: [place];  Service: Vascular;  Laterality: Left;   AV FISTULA PLACEMENT Left 08/10/2017   Procedure: BRACHIOCEPHALIC ARTERIOVENOUS (AV) FISTULA CREATION LEFT UPPER EXTREMITY;  Surgeon: [name], MD;  Location: [place];  Service: Vascular;  Laterality: Left;   CHOLECYSTECTOMY N/A 10/04/2018   Procedure: LAPAROSCOPIC CHOLECYSTECTOMY;  Surgeon: Mickeal Skinner, MD;  Location: [place];  Service: General;  Laterality: N/A;   COLONOSCOPY W/ POLYPECTOMY  03/2016   For evaluation of iron deficiency anemia.  Dr. Silverio Decamp.  10 polyps, largest 20 mm.  Pathology: tubular adenomas with no high-grade dysplasia, hyperplastic.  None bleeding internal hemorrhoids.  Pandiverticulosis.   ENDOVENOUS ABLATION SAPHENOUS VEIN W/ LASER Left 07-23-2015   endovenous laser ablation left greater saphenous vein by [name]   ENDOVENOUS ABLATION SAPHENOUS VEIN W/ LASER Right 10/15/2015   EVLA right greater saphenous vein by [name]   ESOPHAGOGASTRODUODENOSCOPY  03/2016   For evaluation of IDA.  Dr.  Nandigam.  Duodenal erythema, pathology benign mucosa, no villous atrophy.   GALLBLADDER SURGERY  10/2018   IR RADIOLOGIST EVAL & MGMT  11/29/2018   TEE WITHOUT CARDIOVERSION N/A 11/19/2018   Procedure: TRANSESOPHAGEAL ECHOCARDIOGRAM (TEE);  Surgeon: Nahser, Philip J, MD;  [facility];  Service: Cardiovascular;  Laterality: N/A;     Medications: Current Outpatient Medications  Medication Sig Dispense Refill   aspirin EC 81 MG  tablet Take 81 mg by mouth daily. Swallow whole.     b complex-vitamin c-folic acid (NEPHRO-VITE) 0.8 MG TABS tablet Take 1 tablet by mouth daily.     cinacalcet (SENSIPAR) 60 MG tablet Take 60 mg by mouth See admin instructions. Take one tablet by mouth on Tues,Thurs Saturday and sundays only per patient     linagliptin (TRADJENTA) 5 MG TABS tablet Take 1 tablet (5 mg total) by mouth daily. 90 tablet 3   omeprazole (PRILOSEC) 40 MG capsule Take 1 capsule (40 mg total) by mouth daily. Hold omeprazole 20 mg while taking omeprazole 40mg dose for 1 month 30 capsule 0   sevelamer carbonate (RENVELA) 800 MG tablet Take 2 tablets (1,600 mg total) by mouth 3 (three) times daily with meals.     No current facility-administered medications for this visit.    Allergies: Allergies  Allergen Reactions   Codeine Anaphylaxis, Hives, Swelling and Other (See Comments)    Swelling all over body and the throat   Clindamycin/Lincomycin Nausea And Vomiting    Social History: The patient  reports that he quit smoking about 5 years ago. His smoking use included cigarettes. He has a 45.00 pack-year smoking history. He has never used smokeless tobacco. He reports current alcohol use of about 1.0 standard drink of alcohol per week. He reports that he does not use drugs.   Family History: The patient's family history includes Arthritis in his mother; Hearing loss in his father; Heart disease in his maternal grandmother; Hyperlipidemia in his maternal grandmother; Lung cancer in his maternal grandfather; Prostate cancer in his father.   Review of Systems: Please see the history of present illness.   Otherwise, the review of systems is positive for none.   All other systems are reviewed and negative.   Physical Exam: VS:  There were no vitals taken for this visit. .  BMI There is no height or weight on file to calculate BMI.  Wt Readings from Last 3 Encounters:  06/04/20 85.5 kg  05/21/20 84.1 kg  05/07/20 93.5  kg    Affect appropriate Healthy:  appears stated age HEENT: normal Neck supple with no adenopathy JVP normal no bruits no thyromegaly Lungs clear with no wheezing and good diaphragmatic motion Heart:  S1/S2 preserved mild AS murmur, no rub, gallop or click PMI normal Abdomen: benighn, BS positve, no tenderness, no AAA no bruit.  No HSM or HJR Large left upper arm fistula with thrill  No edema Neuro non-focal Skin warm and dry No muscular weakness   LABORATORY DATA:  EKG:   07/06/16 NSR with nonspecific T wave changes.  Lab Results  Component Value Date   WBC 6.7 06/03/2020   HGB 12.5 (A) 06/03/2020   HCT 35.9 (L) 05/20/2020   PLT 277 06/03/2020   GLUCOSE 183 (H) 05/20/2020   CHOL 212 (H) 02/25/2020   TRIG (H) 02/25/2020    433.0 Triglyceride is over 400; calculations on Lipids are invalid.   HDL 38.40 (L) 02/25/2020   LDLDIRECT 64.0 02/25/2020   ALT 17 05/17/2020     AST 19 05/17/2020   NA 139 06/03/2020   K 4.9 06/03/2020   CL 96 (L) 05/20/2020   CREATININE 4.61 (H) 05/20/2020   BUN 18 05/20/2020   CO2 28 05/20/2020   TSH 1.256 01/13/2015   INR 1.0 11/14/2018   HGBA1C 7.2 (H) 05/18/2020    BNP (last 3 results) Recent Labs    05/17/20 1400 05/20/20 0411  BNP 944.5* 929.7*    ProBNP (last 3 results) No results for input(s): PROBNP in the last 8760 hours.   Other Studies Reviewed Today:  Myoview Study Highlights from 05/2014   Myocardial perfusion is normal. The study is abormal. This is an intermediate risk study. Overall left ventricular systolic function was abnormal. LV cavity size is normal. The left ventricular ejection fraction is moderately decreased (42%). There is no prior study for comparison.   Notes Recorded by  C , MD on 06/25/2014 at 8:31 AM Increase cozaar to 100 mg decrease lasix to once/day  F/u with me to discuss cath EF low   Echo Study Conclusions from 05/2014   - Left ventricle: The cavity size was mildly dilated.  Wall   thickness was increased in a pattern of mild LVH. Systolic   function was mildly reduced. The estimated ejection fraction was   in the range of 45% to 50%. Diffuse hypokinesis. Doppler   parameters are consistent with elevated ventricular end-diastolic   filling pressure. - Mitral valve: There was mild regurgitation. - Left atrium: The atrium was mildly dilated. - Atrial septum: No defect or patent foramen ovale was identified. - Pulmonary arteries: PA peak pressure: 52 mm Hg (S). - Pericardium, extracardiac: Small to moderate circumferential   pericardial effusion no tamponade.    Assessment/Plan: 1. Anemia:  Multiple polyps removed f/u GI and CKD f/u primary   2. Chronic diastolic HF -  related to CRF on dialysis Hospitalization April 2022 improved with dialysis  Will order updated echo and lexiscan myovue Last ischemic evaluation 2016 no known CAD   3. Varicose veins/venous insufficiency - chronic. Refer back to Dr Early ? Sclerosing/ablative Rx  4. CKD -   Now on dialysis  AV fistula done 06/01/16 by Dr Early F/U Dr Dunham   5. DM - Discussed low carb diet.  Target hemoglobin A1c is 6.5 or less.  Continue current medications. A1c in September poor control 8.4   6. Prior tobacco abuse - tells me he is not smoking CT 11/15/18 no cancer  7. Aortic Stenosis :  Very mild considering fistula with mean gradient 11 mmHg TTE done 11/07/17 and no progression on TEE/TTE done October 2020 will update echo see above   8. GI:  F/U Dr Nandigam post cholecystectomy with rehospitalization for elevated alk phos  And percutaneous aspiration of gallbladder fossa Rx Klebsiella, Ecoli and Enterococcus bacteremia   Echo for AS Lexiscan myovue CHF   F/U in a year if studies low risk      

## 2020-08-11 ENCOUNTER — Other Ambulatory Visit: Payer: Self-pay

## 2020-08-11 ENCOUNTER — Encounter: Payer: Self-pay | Admitting: Cardiovascular Disease

## 2020-08-11 ENCOUNTER — Ambulatory Visit (INDEPENDENT_AMBULATORY_CARE_PROVIDER_SITE_OTHER): Payer: Medicare Other | Admitting: Cardiovascular Disease

## 2020-08-11 VITALS — BP 124/56 | HR 86 | Ht 68.0 in | Wt 200.0 lb

## 2020-08-11 DIAGNOSIS — I5032 Chronic diastolic (congestive) heart failure: Secondary | ICD-10-CM

## 2020-08-11 DIAGNOSIS — I350 Nonrheumatic aortic (valve) stenosis: Secondary | ICD-10-CM

## 2020-08-11 DIAGNOSIS — N185 Chronic kidney disease, stage 5: Secondary | ICD-10-CM | POA: Diagnosis not present

## 2020-08-11 DIAGNOSIS — I2510 Atherosclerotic heart disease of native coronary artery without angina pectoris: Secondary | ICD-10-CM | POA: Diagnosis not present

## 2020-08-11 NOTE — Patient Instructions (Signed)
Medication Instructions:  *If you need a refill on your cardiac medications before your next appointment, please call your pharmacy*  Lab Work: If you have labs (blood work) drawn today and your tests are completely normal, you will receive your results only by: [place] (if you have MyChart) OR A paper copy in the mail If you have any lab test that is abnormal or we need to change your treatment, we will call you to review the results.  Testing/Procedures: Your physician has requested that you have an echocardiogram. Echocardiography is a painless test that uses sound waves to create images of your heart. It provides your doctor with information about the size and shape of your heart and how well your heart's chambers and valves are working. This procedure takes approximately one hour. There are no restrictions for this procedure.  Your physician has requested that you have a lexiscan myoview. For further information please visit [email or web address]. Please follow instruction sheet, as given.  Follow-Up: At [facility], you and your health needs are our priority.  As part of our continuing mission to provide you with exceptional heart care, we have created designated Provider Care Teams.  These Care Teams include your primary Cardiologist (physician) and Advanced Practice Providers (APPs -  Physician Assistants and Nurse Practitioners) who all work together to provide you with the care you need, when you need it.  We recommend signing up for the patient portal called "MyChart".  Sign up information is provided on this After Visit Summary.  MyChart is used to connect with patients for Virtual Visits (Telemedicine).  Patients are able to view lab/test results, encounter notes, upcoming appointments, etc.  Non-urgent messages can be sent to your provider as well.   To learn more about what you can do with MyChart, go to [email or web address].    Your next appointment:   12  month(s)  The format for your next appointment:   In Person  Provider:   You may see Jenkins Rouge, MD or one of the following Advanced Practice Providers on your designated Care Team:   Cecilie Kicks, NP

## 2020-08-24 ENCOUNTER — Telehealth (HOSPITAL_COMMUNITY): Payer: Self-pay

## 2020-08-24 NOTE — Telephone Encounter (Signed)
Spoke with the patient's mom per DPR and left his instructions. She stated that he would be there for his test. Asked to call back with any questions. S.Sanjith Siwek EMTP

## 2020-08-25 ENCOUNTER — Ambulatory Visit (HOSPITAL_COMMUNITY): Payer: Medicare Other | Attending: Cardiology

## 2020-08-25 ENCOUNTER — Other Ambulatory Visit: Payer: Self-pay

## 2020-08-25 DIAGNOSIS — I350 Nonrheumatic aortic (valve) stenosis: Secondary | ICD-10-CM | POA: Insufficient documentation

## 2020-08-25 DIAGNOSIS — I2510 Atherosclerotic heart disease of native coronary artery without angina pectoris: Secondary | ICD-10-CM | POA: Diagnosis present

## 2020-08-25 DIAGNOSIS — I5032 Chronic diastolic (congestive) heart failure: Secondary | ICD-10-CM | POA: Diagnosis present

## 2020-08-25 LAB — MYOCARDIAL PERFUSION IMAGING
LV dias vol: 109 mL (ref 62–150)
LV sys vol: 50 mL
Peak HR: 86 {beats}/min
Rest HR: 75 {beats}/min
SDS: 0
SRS: 0
SSS: 0
TID: 1.02

## 2020-08-25 MED ORDER — TECHNETIUM TC 99M TETROFOSMIN IV KIT
32.9000 | PACK | Freq: Once | INTRAVENOUS | Status: AC | PRN
  Administered 2020-08-25: 32.9 via INTRAVENOUS
  Filled 2020-08-25: qty 33

## 2020-08-25 MED ORDER — REGADENOSON 0.4 MG/5ML IV SOLN
0.4000 mg | Freq: Once | INTRAVENOUS | Status: AC
  Administered 2020-08-25: 0.4 mg via INTRAVENOUS

## 2020-08-25 MED ORDER — TECHNETIUM TC 99M TETROFOSMIN IV KIT
10.9000 | PACK | Freq: Once | INTRAVENOUS | Status: AC | PRN
  Administered 2020-08-25: 10.9 via INTRAVENOUS
  Filled 2020-08-25: qty 11

## 2020-08-27 ENCOUNTER — Other Ambulatory Visit: Payer: Self-pay

## 2020-08-27 ENCOUNTER — Ambulatory Visit (HOSPITAL_COMMUNITY): Payer: Medicare Other | Attending: Cardiovascular Disease

## 2020-08-27 DIAGNOSIS — I5032 Chronic diastolic (congestive) heart failure: Secondary | ICD-10-CM | POA: Insufficient documentation

## 2020-08-27 DIAGNOSIS — I2510 Atherosclerotic heart disease of native coronary artery without angina pectoris: Secondary | ICD-10-CM | POA: Diagnosis present

## 2020-08-27 DIAGNOSIS — I350 Nonrheumatic aortic (valve) stenosis: Secondary | ICD-10-CM | POA: Diagnosis present

## 2020-08-28 LAB — ECHOCARDIOGRAM COMPLETE
AR max vel: 1.37 cm2
AV Area VTI: 1.38 cm2
AV Area mean vel: 1.35 cm2
AV Mean grad: 9.2 mmHg
AV Peak grad: 15.8 mmHg
Ao pk vel: 1.99 m/s
Area-P 1/2: 3.12 cm2
S' Lateral: 3.3 cm

## 2020-09-08 ENCOUNTER — Encounter: Payer: Self-pay | Admitting: Family Medicine

## 2020-09-08 ENCOUNTER — Ambulatory Visit (INDEPENDENT_AMBULATORY_CARE_PROVIDER_SITE_OTHER): Payer: Medicare Other | Admitting: Family Medicine

## 2020-09-08 ENCOUNTER — Other Ambulatory Visit: Payer: Self-pay

## 2020-09-08 VITALS — BP 119/74 | HR 91 | Temp 98.6°F | Ht 68.0 in | Wt 202.8 lb

## 2020-09-08 DIAGNOSIS — E1140 Type 2 diabetes mellitus with diabetic neuropathy, unspecified: Secondary | ICD-10-CM | POA: Diagnosis not present

## 2020-09-08 DIAGNOSIS — E119 Type 2 diabetes mellitus without complications: Secondary | ICD-10-CM | POA: Diagnosis not present

## 2020-09-08 DIAGNOSIS — N186 End stage renal disease: Secondary | ICD-10-CM

## 2020-09-08 DIAGNOSIS — G8929 Other chronic pain: Secondary | ICD-10-CM

## 2020-09-08 DIAGNOSIS — M25561 Pain in right knee: Secondary | ICD-10-CM | POA: Diagnosis not present

## 2020-09-08 DIAGNOSIS — Z992 Dependence on renal dialysis: Secondary | ICD-10-CM

## 2020-09-08 DIAGNOSIS — I5032 Chronic diastolic (congestive) heart failure: Secondary | ICD-10-CM

## 2020-09-08 DIAGNOSIS — I350 Nonrheumatic aortic (valve) stenosis: Secondary | ICD-10-CM

## 2020-09-08 DIAGNOSIS — M25562 Pain in left knee: Secondary | ICD-10-CM | POA: Diagnosis not present

## 2020-09-08 DIAGNOSIS — Z8673 Personal history of transient ischemic attack (TIA), and cerebral infarction without residual deficits: Secondary | ICD-10-CM

## 2020-09-08 DIAGNOSIS — I2510 Atherosclerotic heart disease of native coronary artery without angina pectoris: Secondary | ICD-10-CM

## 2020-09-08 LAB — POCT GLYCOSYLATED HEMOGLOBIN (HGB A1C): Hemoglobin A1C: 8.1 % — AB (ref 4.0–5.6)

## 2020-09-08 MED ORDER — OMEPRAZOLE 20 MG PO CPDR: 20.0000 mg | DELAYED_RELEASE_CAPSULE | Freq: Every day | ORAL | 3 refills | Status: DC

## 2020-09-08 MED ORDER — ROSUVASTATIN CALCIUM 10 MG PO TABS: 10.0000 mg | ORAL_TABLET | Freq: Every day | ORAL | 3 refills | Status: DC

## 2020-09-08 NOTE — Addendum Note (Signed)
Addended by: Linton Ham on: 09/08/2020 04:33 PM   Modules accepted: Orders

## 2020-09-08 NOTE — Progress Notes (Signed)
Phone [phone] In person visit   Subjective:   Edgar Brewer is a 60 y.o. year old very pleasant male patient who presents for/with See problem oriented charting Chief Complaint  Patient presents with   Hypertension    This visit occurred during the SARS-CoV-2 public health emergency.  Safety protocols were in place, including screening questions prior to the visit, additional usage of staff PPE, and extensive cleaning of exam room while observing appropriate contact time as indicated for disinfecting solutions.   Past Medical History-  Patient Active Problem List   Diagnosis Date Noted   History of stroke 02/25/2020    Priority: High   ESRD (end stage renal disease) on dialysis ([place]) 10/03/2018    Priority: High   Diabetes mellitus type II, non insulin dependent ([place]) 01/14/2015    Priority: High   Chronic diastolic CHF (congestive heart failure) ([place]) 01/13/2015    Priority: High   GERD (gastroesophageal reflux disease) 03/25/2020    Priority: Medium   Aortic stenosis 02/25/2020    Priority: Medium   Type 2 diabetes mellitus with diabetic neuropathy, unspecified ([place]) 02/25/2020    Priority: Medium   Gallbladder abscess 11/15/2018    Priority: Medium   Elevated LFTs 10/18/2018    Priority: Medium   Other disorders of phosphorus metabolism 01/19/2018    Priority: Medium   Secondary hyperparathyroidism of renal origin ([place]) 09/27/2017    Priority: Medium   Hypertension associated with diabetes ([place]) 01/14/2015    Priority: Medium   Anemia of chronic kidney failure 01/14/2015    Priority: Medium   Varicose veins of lower extremities with complications 89/37/3428    Priority: Medium   CKD (chronic kidney disease), stage IV ([place]) 01/14/2015    Priority: Low   Obesity 01/14/2015    Priority: Low   Chronic venous insufficiency 10/22/2014    Priority: Low   Acute respiratory failure with hypoxia ([place]) 05/17/2020   Wound of left leg 05/08/2020   Left leg cellulitis  05/08/2020    Medications- reviewed and updated Current Outpatient Medications  Medication Sig Dispense Refill   aspirin EC 81 MG tablet Take 81 mg by mouth daily. Swallow whole.     b complex-vitamin c-folic acid (NEPHRO-VITE) 0.8 MG TABS tablet Take 1 tablet by mouth daily.     cinacalcet (SENSIPAR) 60 MG tablet Take 60 mg by mouth See admin instructions. Take one tablet by mouth on Tues,Thurs Saturday and sundays only per patient     linagliptin (TRADJENTA) 5 MG TABS tablet Take 1 tablet (5 mg total) by mouth daily. 90 tablet 3   sevelamer carbonate (RENVELA) 800 MG tablet Take 2 tablets (1,600 mg total) by mouth 3 (three) times daily with meals.     omeprazole (PRILOSEC) 20 MG capsule Take 1 capsule (20 mg total) by mouth daily. 90 capsule 3   rosuvastatin (CRESTOR) 10 MG tablet Take 1 tablet (10 mg total) by mouth daily. 90 tablet 3   No current facility-administered medications for this visit.     Objective:  BP 119/74   Pulse 91   Temp 98.6 F (37 C) (Temporal)   Ht 5\' 8"  (1.727 m)   Wt 202 lb 12.8 oz (92 kg)   SpO2 97%   BMI 30.84 kg/m  Gen: NAD, resting comfortably CV: RRR no murmurs rubs or gallops Lungs: CTAB no crackles, wheeze, rhonchi Abdomen: soft/nontender/nondistended/normal bowel sounds. No rebound or guarding.  Ext: no edema Skin: warm, dry [place]ghe: No joint line pain on exam today  Assessment and Plan    # Sick on stomach- about a week ago felt nauseous and threw up after breakfast. State hd something happen in the past.  -prior had seroma formation in 2020 after gallbladder removed- this weighs heavily on him and wants to repeat imaging. We discussed if he has recurrent symptoms of nausea/vomiting to let us know.   # Bilateral knee pain- patient with bilateral knee pain and states cardiology advised x-rays/further workup - offered referral to sports medicine- he agrees.  Patient states knee pain makes him not want to walk as much-complains also of a  stiffening sensation.  #End-stage renal disease on hemodialysis since January 2019. thorugh related to diabtes #Secondary hyperparathyroidism S: Patient on dialysis followed by Dr. Lorrene Reid on MWF at [facility] site  -AV fistula 06/01/2016 by Dr. Donnetta Hutching A/P: stable- continue current meds as below  -Patient compliant with Sensipar for secondary hyperparathyroidism -On Renvela phosphate binder  #Heart failure with preserved ejection fraction-follows with Dr. [name] S: Medication: lasix in past, now managed by dialysis  Edema, weight gain, fluids status: Managed by dialysis Shortness of breath: no more than normal A/P: Stable. Continue current medications.    #hypertension S: medication: none  BP Readings from Last 3 Encounters:  09/08/20 119/74  08/11/20 (!) 124/56  06/04/20 138/70  A/P: Stable. Continue current medications.   #Aortic stenosis S:Last echocardiogram October 2020-reported as very mild considering fistula.  Had repeat 08/27/2020-mild aortic stenosis with stable ejection fraction A/P: stable on most recent echo- continue to monitor- does not report chest pain, shortness of breath.    # Diabetes with neuropathy-A1c goal 7.5 or less is reasonable S: Medication:Tradjenta 5 mg. Roughly remembers a1c may be up.  For neuropathy-gabapentin 300mg   in past- he did not like the max dose 300mg  per day and opted to stop.  Exercise and diet- not exercising. Weight up 14 lbs from last visit- he does not report that dialysis thinks this is water weight- we discussed cutting down on snacking to help Lab Results  Component Value Date   HGBA1C 7.2 (H) 05/18/2020   HGBA1C 8.2 (H) 02/25/2020   HGBA1C 7.3 (H) 11/15/2018  A/P: will update a1c poc today- sounds like needs to tighten up on quantity of food and snacking  #history of stroke- now on aspirin 81mg - stutter lingers from this #hyperlipidemia S: Medication: states not on statin as of 2022. We sent in last visit Lab Results  Component Value  Date   CHOL 212 (H) 02/25/2020   HDL 38.40 (L) 02/25/2020   LDLDIRECT 64.0 02/25/2020   TRIG (H) 02/25/2020    433.0 Triglyceride is over 400; calculations on Lipids are invalid.   CHOLHDL 6 02/25/2020  A/P: with history of stroke want him back on rosuvastatin. Sent in rosuvastatin 10 mg. Some concern with LFTs  #Elevated LFTs S: Patient with laparoscopic cholecystectomy September 0981 complicated by gallbladder fossa abscess status post percutaneous aspiration and JP drain in October 2020. Ultimately developed sepsis with bacteremia. LFTs were slowly improving per notes on February 07, 2019 visit with Dr. Dyann Kief was for repeat LFTs in 3 months, avoid hepatotoxins, NSAIDs, alcohol, OTC meds -They were planning for repeat colonoscopy next visit- he declines and states will never change his mind A/P:  ast/alt ok- will retrial statin as above- recheck next visit Lab Results  Component Value Date   ALT 17 05/17/2020   AST 19 05/17/2020   GGT 668 (H) 10/20/2018   ALKPHOS 408 (H) 05/17/2020   BILITOT 0.6  05/17/2020    # GERD S:Medication: Omeprazole 20 mg in past- did higher dose short term in past   A/P: retrial 20mg - hold off for now   # former smoker- get AAA scan at 77 . Offered lung cancer screening program - opts out of this.   #Varicose veins/venous insufficiencyCardiology has considered referral back to Dr. Donnetta Hutching of vein and vascular- has had procedures in past. Patient with no complaints today  #HM- declines cancer screenings including colonoscopy and lung cancer screening. See avs as well  Recommended follow up: Return in about 3 months (around 12/09/2020) for follow up- or sooner if needed.   Lab/Order associations:   ICD-10-CM   1. Chronic pain of both knees  M25.561 Ambulatory referral to Sports Medicine   M25.562    G89.29     2. ESRD (end stage renal disease) on dialysis ([place])  N18.6    Z99.2     3. Diabetes mellitus type II, non insulin dependent (HCC)   E11.9     4. Chronic diastolic CHF (congestive heart failure) (H[facility])  I50.32     5. History of stroke  Z86.73     6. Aortic valve stenosis, etiology of cardiac valve disease unspecified  I35.0     7. Type 2 diabetes mellitus with diabetic neuropathy, without long-term current use of insulin (H[facility])  E11.40       Meds ordered this encounter  Medications   rosuvastatin (CRESTOR) 10 MG tablet    Sig: Take 1 tablet (10 mg total) by mouth daily.    Dispense:  90 tablet    Refill:  3   omeprazole (PRILOSEC) 20 MG capsule    Sig: Take 1 capsule (20 mg total) by mouth daily.    Dispense:  90 capsule    Refill:  3    I,Harris Phan,acting as a scribe for Garret Reddish, MD.,have documented all relevant documentation on the behalf of Davied Nocito, MD,as directed by  Garret Reddish, MD while in the presence of Garret Reddish, MD.  I, Garret Reddish, MD, have reviewed all documentation for this visit. The documentation on 09/08/20 for the exam, diagnosis, procedures, and orders are all accurate and complete.   Return precautions advised.  Garret Reddish, MD

## 2020-09-08 NOTE — Patient Instructions (Addendum)
Health Maintenance Due  Topic Date Due   OPHTHALMOLOGY EXAM If you have had your eye exam within the last year, please sign release of information at check out desk. If you have not had an eye exam within a year, please get one at this time as this is important for your diabetes care  Never done   Zoster Vaccines- Shingrix (1 of 2) Patient states that he has had shingles twice. Technically can still get vaccine  Please check with your pharmacy to see if they have the shingrix vaccine. If they do- please get this immunization and update [place] by phone call or mychart with dates you receive the vaccine  Never done   COVID-19 Vaccine - Please consider getting the new strand vaccination when it becomes available. 05/23/2020   INFLUENZA VACCINE Not available in office yet. Please consider getting your flu shot in the Fall. If you get this outside of our office, please let us know.  08/24/2020   We will call you within two weeks about your referral to sports medicine. If you do not hear within 2 weeks, give [place] a call.   I encourage you to try to exercise - at least 150 minutes per week. I also want to encourage you to try to start a healthy diet and watch your calories. I feel these lifestyle choices would be best for your diabetes.  Team- POC A1c before you leave today. I will contact about next steps if high  Sign release of information at the check out desk for records from 3 months ago.  Recommended follow up: Return in about 3 months (around 12/09/2020) for follow up- or sooner if needed.

## 2020-09-17 ENCOUNTER — Ambulatory Visit (INDEPENDENT_AMBULATORY_CARE_PROVIDER_SITE_OTHER): Payer: Medicare Other | Admitting: Family Medicine

## 2020-09-17 ENCOUNTER — Ambulatory Visit (INDEPENDENT_AMBULATORY_CARE_PROVIDER_SITE_OTHER): Payer: Medicare Other

## 2020-09-17 ENCOUNTER — Other Ambulatory Visit: Payer: Self-pay

## 2020-09-17 ENCOUNTER — Ambulatory Visit: Payer: Self-pay

## 2020-09-17 ENCOUNTER — Encounter: Payer: Self-pay | Admitting: Family Medicine

## 2020-09-17 VITALS — BP 110/60 | HR 85 | Ht 68.0 in | Wt 203.6 lb

## 2020-09-17 DIAGNOSIS — M170 Bilateral primary osteoarthritis of knee: Secondary | ICD-10-CM | POA: Diagnosis not present

## 2020-09-17 DIAGNOSIS — G8929 Other chronic pain: Secondary | ICD-10-CM | POA: Diagnosis not present

## 2020-09-17 DIAGNOSIS — M25562 Pain in left knee: Secondary | ICD-10-CM | POA: Diagnosis not present

## 2020-09-17 DIAGNOSIS — M79604 Pain in right leg: Secondary | ICD-10-CM | POA: Diagnosis not present

## 2020-09-17 DIAGNOSIS — I2510 Atherosclerotic heart disease of native coronary artery without angina pectoris: Secondary | ICD-10-CM

## 2020-09-17 DIAGNOSIS — M79605 Pain in left leg: Secondary | ICD-10-CM

## 2020-09-17 DIAGNOSIS — M25561 Pain in right knee: Secondary | ICD-10-CM

## 2020-09-17 NOTE — Patient Instructions (Addendum)
Thank you for coming in today.   Please get an Xray today before you leave   You should hear about the blood flow test.    We will ask for permission to do gel shots.  You should hear about that in a week or two. Let me know if you do not hear anything.

## 2020-09-17 NOTE — Progress Notes (Signed)
I, Wendy Poet, LAT, ATC, am serving as scribe for Dr. Lynne Leader.  Subjective:    I'm seeing this patient as a consultation for: Dr. Garret Reddish. Note will be routed back to referring provider/PCP.  CC: Bilat knee pain  HPI: Pt is a 60 y/o male c/o bilat knee stiffness x  one year. Pt locates pain to his B ant knees that radiates into his B lower legs.  Pain is worse with activity better with rest.  Does have a history of CAD.  Patient does not have assessment so far for PAD.  Knee swelling: No Mechanical symptoms: yes in the L knee Aggravates: walking; transitioning from sitting-to-standing; stairs Treatments tried: Nothing  Past medical history, Surgical history, Family history, Social history, Allergies, and medications have been entered into the medical record, reviewed.  Dialysis.  Not ideally controlled diabetes.  Review of Systems: No new headache, visual changes, nausea, vomiting, diarrhea, constipation, dizziness, abdominal pain, skin rash, fevers, chills, night sweats, weight loss, swollen lymph nodes, body aches, joint swelling, muscle aches, chest pain, shortness of breath, mood changes, visual or auditory hallucinations.   Objective:    Vitals:   09/17/20 1349  BP: 110/60  Pulse: 85  SpO2: 96%   General: Well Developed, well nourished, and in no acute distress.  Neuro/Psych: Alert and oriented x3, extra-ocular muscles intact, able to move all 4 extremities, sensation grossly intact. Skin: Warm and dry, no rashes noted.  Respiratory: Not using accessory muscles, speaking in full sentences, trachea midline.  Cardiovascular: Pulses palpable, no extremity edema. Abdomen: Does not appear distended. MSK: Knees bilaterally with mild effusion normal motion with crepitation.  Tender palpation anterior and medial joint line bilaterally.  Stable ligamentous exam bilaterally. Bilateral lower extremities no significant swelling.  Intact pulses  Lab and Radiology  Results  Diagnostic Limited MSK Ultrasound of: Right knee Quad tendon intact normal-appearing patellar tendon intact normal appearing. Medial joint line narrowed degenerative appearing Lateral joint line intact normal appearing lateral meniscus. Posterior knee no Baker's cyst. Impression: DJD.  Diagnostic Limited MSK Ultrasound of: Left knee Quad tendon intact normal-appearing.  Patellar tendon intact and normal-appearing Medial joint line narrowed and degenerative appearing Lateral joint line intact and normal-appearing lateral meniscus No Baker's cyst is present in the posterior knee Impression: DJD  X-ray images bilateral knees obtained today personally and independently interpreted  Right knee: Mild medial compartment DJD.  No acute fractures.  Left knee: Mild medial compartment DJD.  No acute fractures.  Await formal radiology review    Impression and Recommendations:    Assessment and Plan: 60 y.o. male with bilateral knee pain.  Pain thought to be due to DJD.  Ideally at this point would be proceed with a steroid injection however his diabetes is not as well-controlled as I would like.  He is at risk for hyperglycemia with steroid injections.  We will work on authorization for hyaluronic acid injections or proceed to that if possible as I believe it will be safer.  Additionally his lower leg pain with activity.  He is a high risk for peripheral arterial disease.  Although I do not think his symptoms are mostly due to his claudication I think is worth checking an ABI to be sure that his symptoms are not claudication.  I think his symptoms are more likely related to peripheral neuropathy than anything else.Marland Kitchen   PDMP not reviewed this encounter. Orders Placed This Encounter  Procedures   [place] LIMITED JOINT SPACE STRUCTURES LOW BILAT(NO  LINKED CHARGES)    Standing Status:   Future    Number of Occurrences:   1    Standing Expiration Date:   03/20/2021    Order Specific  Question:   Reason for Exam (SYMPTOM  OR DIAGNOSIS REQUIRED)    Answer:   bilateral knee pain    Order Specific Question:   Preferred imaging location?    Answer:   [facility]   DG Knee AP/LAT W/Sun[place] Left    Standing Status:   Future    Number of Occurrences:   1    Standing Expiration Date:   10/18/2020    Order Specific Question:   Reason for Exam (SYMPTOM  OR DIAGNOSIS REQUIRED)    Answer:   L knee pain    Order Specific Question:   Preferred imaging location?    Answer:   [facility]   DG Knee AP/LAT W/Sunrise Right    Standing Status:   Future    Number of Occurrences:   1    Standing Expiration Date:   10/18/2020    Order Specific Question:   Reason for Exam (SYMPTOM  OR DIAGNOSIS REQUIRED)    Answer:   R knee pain    Order Specific Question:   Preferred imaging location?    Answer:   [facility]   No orders of the defined types were placed in this encounter.   Discussed warning signs or symptoms. Please see discharge instructions. Patient expresses understanding.   The above documentation has been reviewed and is accurate and complete Lynne Leader, M.D.

## 2020-09-21 NOTE — Progress Notes (Signed)
Left knee x-ray looks pretty normal to radiology.

## 2020-09-21 NOTE — Progress Notes (Signed)
Right knee x-ray looks pretty normal to radiology as well.

## 2020-09-24 ENCOUNTER — Ambulatory Visit (HOSPITAL_COMMUNITY)
Admission: RE | Admit: 2020-09-24 | Discharge: 2020-09-24 | Disposition: A | Discharge status: Home / Self Care | Payer: Medicare Other | Source: Ambulatory Visit | Attending: Family Medicine | Admitting: Family Medicine

## 2020-09-24 ENCOUNTER — Other Ambulatory Visit: Payer: Self-pay

## 2020-09-24 DIAGNOSIS — M79605 Pain in left leg: Secondary | ICD-10-CM | POA: Diagnosis present

## 2020-09-24 DIAGNOSIS — M79604 Pain in right leg: Secondary | ICD-10-CM | POA: Diagnosis present

## 2020-09-29 NOTE — Progress Notes (Signed)
Vascular ultrasound to study the blood flow in the leg was nondiagnostic.  The test could not be done correctly.

## 2020-10-01 ENCOUNTER — Telehealth: Payer: Self-pay | Admitting: Physical Therapy

## 2020-10-01 DIAGNOSIS — Z992 Dependence on renal dialysis: Secondary | ICD-10-CM

## 2020-10-01 DIAGNOSIS — M79604 Pain in right leg: Secondary | ICD-10-CM

## 2020-10-01 DIAGNOSIS — N186 End stage renal disease: Secondary | ICD-10-CM

## 2020-10-01 NOTE — Telephone Encounter (Signed)
Called pt regarding his VAS ABI results and provided options of either being referred to vasc sx or cardiology and he states that he is fine with either.  He states that he will go with whatever option Dr. Georgina Snell thinks is the best.  Please place referral to whichever specialist you deem most appropriate.

## 2020-10-05 NOTE — Addendum Note (Signed)
Addended by: Gregor Hams on: 10/05/2020 07:29 AM   Modules accepted: Orders

## 2020-10-05 NOTE — Telephone Encounter (Signed)
Referral to vascular surgery placed today.

## 2020-10-19 ENCOUNTER — Telehealth: Payer: Self-pay | Admitting: Family Medicine

## 2020-10-19 NOTE — Telephone Encounter (Signed)
Left message for patient to call back and schedule Medicare Annual Wellness Visit (AWV) either virtually OR in office.   AWV-I PER [place] 04/25/18  please schedule at anytime with [facility]-Nurse Health Advisor at [facility].  This should be a 45 minute visit.

## 2020-10-19 NOTE — Progress Notes (Signed)
[facility] [place]  ASSESSMENT / PLAN: Edgar Brewer is a 60 y.o. male with atherosclerosis of native arteries of bilateral lower extremities causing  atypical symptoms .  The patient has no evidence of chronic limb threatening ischemia.  I counseled him to return to clinic promptly should he develop a nonhealing ulcer on his feet.  Recommend the following which can slow the progression of atherosclerosis and reduce the risk of major adverse cardiac / limb events:  Complete cessation from all tobacco products. Blood glucose control with goal A1c < 7%. Blood pressure control with goal blood pressure < 140/90 mmHg. Lipid reduction therapy with goal LDL-C <100 mg/dL (<70 if symptomatic from PAD).  Aspirin 81mg  PO QD.  Atorvastatin 40-80mg  PO QD (or other "high intensity" statin therapy). Daily walking to and past the point of discomfort. Patient counseled to keep a log of exercise distance.  Follow up in 1 year with [facility]VS PA for ABI surveillance.  CHIEF COMPLAINT: leg pain  HISTORY OF PRESENT ILLNESS: Edgar Brewer is a 60 y.o. 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  The patient reports "stiffness" in his bilateral knees extending into his lower extremities bilaterally.  Plain films made the x-rays showed abundant calcification in his tibial vessels.  He underwent an ankle-brachial index which revealed noncompressible tibial arteries bilaterally.  He had reassuring waveforms and reassuring toe pressures.  The patient does not give a classic history of claudication.  He reports the pain can occur at activity or at rest.  He does not have ischemic rest pain symptoms.  He has no ulceration about his feet.  He does suffer from neuropathy, and feels like the symptoms are progression of his neuropathic discomfort.  Past Medical History:  Diagnosis Date    Acute on chronic combined systolic and diastolic CHF, NYHA class 1 ([place]) 01/13/2015   Allergy    Arthritis    Chronic kidney disease    MWF Edgar Brewer.   Diabetes mellitus type 2 with complications ([place])    Previous insulin, non-insulin requiring noted 09/2018.  Complications include retinopathy, peripheral neuropathy.   GERD (gastroesophageal reflux disease)    at times   Head injury with loss of consciousness ([place])    History of kidney stones    Hypertension    Neuromuscular disorder ([place])    Neuropathy    Pulmonary hypertension ([place])    PA peak pressure 41 mmHg 03/04/16 echo   Stroke [facility])    Varicose veins of lower extremities with complications 19/62/2297    Past Surgical History:  Procedure Laterality Date   AV FISTULA PLACEMENT Left 06/01/2016   Procedure: ARTERIOVENOUS (AV) FISTULA CREATION;  Surgeon: [name], MD;  Location: [place];  Service: Vascular;  Laterality: Left;   AV FISTULA PLACEMENT Left 08/10/2017   Procedure: BRACHIOCEPHALIC ARTERIOVENOUS (AV) FISTULA CREATION LEFT UPPER EXTREMITY;  Surgeon: [name], MD;  Location: [place];  Service: Vascular;  Laterality: Left;   CHOLECYSTECTOMY N/A 10/04/2018   Procedure: LAPAROSCOPIC CHOLECYSTECTOMY;  Surgeon: Mickeal Skinner, MD;  Location: [place];  Service: General;  Laterality: N/A;   COLONOSCOPY W/ POLYPECTOMY  03/2016   For evaluation of iron deficiency anemia.  Dr. Silverio Decamp.  10 polyps, largest 20 mm.  Pathology: tubular adenomas with no high-grade dysplasia, hyperplastic.  None bleeding internal hemorrhoids.  Pandiverticulosis.   ENDOVENOUS ABLATION SAPHENOUS VEIN W/ LASER Left 07-23-2015   endovenous laser  ablation left greater saphenous vein by [name]   ENDOVENOUS ABLATION SAPHENOUS VEIN W/ LASER Right 10/15/2015   EVLA right greater saphenous vein by [name]   ESOPHAGOGASTRODUODENOSCOPY  03/2016   For evaluation of IDA.  Dr. Silverio Decamp.  Duodenal erythema, pathology benign mucosa, no  villous atrophy.   GALLBLADDER SURGERY  10/2018   IR RADIOLOGIST EVAL & MGMT  11/29/2018   TEE WITHOUT CARDIOVERSION N/A 11/19/2018   Procedure: TRANSESOPHAGEAL ECHOCARDIOGRAM (TEE);  Surgeon: Acie Fredrickson, Wonda Cheng, MD;  Location: [facility] ENDOSCOPY;  Service: Cardiovascular;  Laterality: N/A;    Family History  Problem Relation Age of Onset   Arthritis Mother    Hearing loss Father    Prostate cancer Father    Hyperlipidemia Maternal Grandmother    Heart disease Maternal Grandmother    Lung cancer Maternal Grandfather     Social History   Socioeconomic History   Marital status: Single    Spouse name: Not on file   Number of children: Not on file   Years of education: Not on file   Highest education level: Not on file  Occupational History   Not on file  Tobacco Use   Smoking status: Former    Packs/day: 1.00    Years: 45.00    Pack years: 45.00    Types: Cigarettes    Quit date: 01/13/2015    Years since quitting: 5.7   Smokeless tobacco: Never  Vaping Use   Vaping Use: Never used  Substance and Sexual Activity   Alcohol use: Yes    Alcohol/week: 1.0 standard drink    Types: 1 Glasses of wine per week    Comment: rare occassion   Drug use: No   Sexual activity: Not Currently  Other Topics Concern   Not on file  Social History Narrative   Separated for many years over 95 years/practical divorce. No Kids. No pets.  Lives with mom Edgar Brewer      Disabled- from dialysis and diabetes. 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      Hobbies: play cards, Chief Operating Officer, bet on horses   Social Determinants of Radio broadcast assistant Strain: Not on file  Food Insecurity: Not on file  Transportation Needs: Not on file  Physical Activity: Not on file  Stress: Not on file  Social Connections: Not on file  Intimate Partner Violence: Not on file    Allergies  Allergen Reactions   Codeine Anaphylaxis, Hives, Swelling and Other (See Comments)     Swelling all over body and the throat   Clindamycin/Lincomycin Nausea And Vomiting    Current Outpatient Medications  Medication Sig Dispense Refill   aspirin EC 81 MG tablet Take 81 mg by mouth daily. Swallow whole.     b complex-vitamin c-folic acid (NEPHRO-VITE) 0.8 MG TABS tablet Take 1 tablet by mouth daily.     cinacalcet (SENSIPAR) 60 MG tablet Take 60 mg by mouth See admin instructions. Take one tablet by mouth on Tues,Thurs Saturday and sundays only per patient     linagliptin (TRADJENTA) 5 MG TABS tablet Take 1 tablet (5 mg total) by mouth daily. 90 tablet 3   omeprazole (PRILOSEC) 20 MG capsule Take 1 capsule (20 mg total) by mouth daily. 90 capsule 3   rosuvastatin (CRESTOR) 10 MG tablet Take 1 tablet (10 mg total) by mouth daily. 90 tablet 3   sevelamer carbonate (RENVELA) 800 MG tablet Take 2 tablets (1,600  mg total) by mouth 3 (three) times daily with meals.     No current facility-administered medications for this visit.    REVIEW OF SYSTEMS:  [X]  denotes positive finding, [ ]  denotes negative finding Cardiac  Comments:  Chest pain or chest pressure:    Shortness of breath upon exertion:    Short of breath when lying flat:    Irregular heart rhythm:        Vascular    Pain in calf, thigh, or hip brought on by ambulation:    Pain in feet at night that wakes you up from your sleep:     Blood clot in your veins:    Leg swelling:         Pulmonary    Oxygen at home:    Productive cough:     Wheezing:         Neurologic    Sudden weakness in arms or legs:     Sudden numbness in arms or legs:     Sudden onset of difficulty speaking or slurred speech:    Temporary loss of vision in one eye:     Problems with dizziness:         Gastrointestinal    Blood in stool:     Vomited blood:         Genitourinary    Burning when urinating:     Blood in urine:        Psychiatric    Major depression:         Hematologic    Bleeding problems:    Problems with blood  clotting too easily:        Skin    Rashes or ulcers:        Constitutional    Fever or chills:      PHYSICAL EXAM Vitals:   10/20/20 1005  BP: 126/71  Pulse: 85  Resp: 20  Temp: 99 F (37.2 C)  SpO2: 97%  Weight: 204 lb (92.5 kg)  Height: 5\' 8"  (1.727 m)    Constitutional: chronically ill appearing. no distress. Appears well nourished.  Neurologic: CN intact. no focal findings. no sensory loss. Psychiatric:  Mood and affect symmetric and appropriate. Eyes:  No icterus. No conjunctival pallor. Ears, nose, throat:  mucous membranes moist. Midline trachea.  Cardiac: regular rate and rhythm.  Respiratory:  unlabored. Abdominal:  soft, non-tender, non-distended.  Peripheral vascular: LUE AVF with good thrill. No palpable pedal pulses. Feet are warm. Abundant varicosities about bilateral calves which are not tender. Extremity: no edema. no cyanosis. no pallor.  Skin: no gangrene. no ulceration.  Lymphatic: no Stemmer's sign. no palpable lymphadenopathy.  PERTINENT LABORATORY AND RADIOLOGIC DATA  Most recent CBC CBC Latest Ref Rng & Units 06/03/2020 05/20/2020 05/19/2020  WBC - 6.7 8.1 7.4  Hemoglobin 13.5 - 17.5 12.5(A) 10.9(L) 10.2(L)  Hematocrit 39.0 - 52.0 % - 35.9(L) 34.2(L)  Platelets 150 - 399 277 328 297     Most recent CMP CMP Latest Ref Rng & Units 06/03/2020 05/20/2020 05/19/2020  Glucose 70 - 99 mg/dL - 183(H) 151(H)  BUN 8 - 23 mg/dL - 18 20  Creatinine 0.61 - 1.24 mg/dL - 4.61(H) 4.43(H)  Sodium 137 - 147 139 136 137  Potassium 3.4 - 5.3 4.9 4.4 4.4  Chloride 98 - 111 mmol/L - 96(L) 101  CO2 22 - 32 mmol/L - 28 28  Calcium 8.9 - 10.3 mg/dL - 9.3 9.4  Total Protein 6.5 - 8.1 g/dL - - -  Total Bilirubin 0.3 - 1.2 mg/dL - - -  Alkaline Phos 38 - 126 U/L - - -  AST 15 - 41 U/L - - -  ALT 0 - 44 U/L - - -    Renal function CrCl cannot be calculated (Patient's most recent lab result is older than the maximum 21 days allowed.).  Hemoglobin A1C (%)  Date  Value  09/08/2020 8.1 (A)   Hgb A1c MFr Bld (%)  Date Value  05/18/2020 7.2 (H)    Direct LDL  Date Value Ref Range Status  02/25/2020 64.0 mg/dL Final    Comment:    Optimal:  <100 mg/dLNear or Above Optimal:  100-129 mg/dLBorderline High:  130-159 mg/dLHigh:  160-189 mg/dLVery High:  >190 mg/dL     LOWER EXTREMITY DOPPLER STUDY   Patient Name:  Edgar Brewer  Date of Exam:   09/24/2020  Medical Rec #: [id number]     Accession #:    [id number]  Date of Birth: [birth year]     Patient Gender: M  Patient Age:   69 years  Exam Location:  [facility]  Procedure:      VAS [place] ABI WITH/WO TBI  Referring Phys:    ---------------------------------------------------------------------------  -----     Indications: Claudication of bilateral lower extremities after short  distances.   High Risk Factors: Hypertension, Diabetes, prior CVA.     Vascular Interventions: Left AVF.   Performing Technologist: Ronal Fear RVS, RCS      Examination Guidelines: A complete evaluation includes at minimum, Doppler  waveform signals and systolic blood pressure reading at the level of  bilateral  brachial, anterior tibial, and posterior tibial arteries, when vessel  segments  are accessible. Bilateral testing is considered an integral part of a  complete  examination. Photoelectric Plethysmograph (PPG) waveforms and toe systolic  pressure readings are included as required and additional duplex testing  as  needed. Limited examinations for reoccurring indications may be performed  as  noted.      ABI Findings:  +---------+------------------+-----+--------+----------------+  Right    Rt Pressure (mmHg)IndexWaveformComment           +---------+------------------+-----+--------+----------------+  Brachial 139                                              +---------+------------------+-----+--------+----------------+  PTA                              biphasicnon-compressible  +---------+------------------+-----+--------+----------------+  DP                              biphasicnon-compressible  +---------+------------------+-----+--------+----------------+  Great Toe113               0.81                           +---------+------------------+-----+--------+----------------+   +---------+------------------+-----+--------+----------------+  Left     Lt Pressure (mmHg)IndexWaveformComment           +---------+------------------+-----+--------+----------------+  PTA                             biphasicnon-compressible  +---------+------------------+-----+--------+----------------+  DP  biphasicnon-compressible  +---------+------------------+-----+--------+----------------+  Great Toe107               0.77                           +---------+------------------+-----+--------+----------------+   +-------+-----------+-----------+------------+------------+  ABI/TBIToday's ABIToday's TBIPrevious ABIPrevious TBI  +-------+-----------+-----------+------------+------------+  Right  [place]         0.81                                 +-------+-----------+-----------+------------+------------+  Left   [place]         0.77                                 +-------+-----------+-----------+------------+------------+             Summary:  Right: Resting right ankle-brachial index indicates noncompressible right  lower extremity arteries. The right toe-brachial index is normal.   Left: Resting left ankle-brachial index indicates noncompressible left  lower extremity arteries. The left toe-brachial index is normal.       *See table(s) above for measurements and observations.       Electronically signed by Deitra Mayo MD on 09/24/2020 at 12:49:43  PM.   Yevonne Aline. Stanford Breed, MD [facility] [place] Phone Number: [phone] 10/20/2020 10:25 AM  Total time spent on preparing this encounter including chart review, data review, collecting history, examining the patient, coordinating care for this new patient, 60 minutes.  Portions of this report may have been transcribed using voice recognition software.  Every effort has been made to ensure accuracy; however, inadvertent computerized transcription errors may still be present.

## 2020-10-20 ENCOUNTER — Ambulatory Visit (INDEPENDENT_AMBULATORY_CARE_PROVIDER_SITE_OTHER): Payer: Medicare Other | Admitting: Vascular Surgery

## 2020-10-20 ENCOUNTER — Encounter: Payer: Self-pay | Admitting: Vascular Surgery

## 2020-10-20 ENCOUNTER — Other Ambulatory Visit: Payer: Self-pay

## 2020-10-20 VITALS — BP 126/71 | HR 85 | Temp 99.0°F | Resp 20 | Ht 68.0 in | Wt 204.0 lb

## 2020-10-20 DIAGNOSIS — I739 Peripheral vascular disease, unspecified: Secondary | ICD-10-CM

## 2020-12-10 ENCOUNTER — Other Ambulatory Visit: Payer: Self-pay

## 2020-12-10 ENCOUNTER — Ambulatory Visit (INDEPENDENT_AMBULATORY_CARE_PROVIDER_SITE_OTHER): Payer: Medicare Other | Admitting: Family Medicine

## 2020-12-10 ENCOUNTER — Encounter: Payer: Self-pay | Admitting: Family Medicine

## 2020-12-10 VITALS — BP 132/62 | HR 85 | Temp 98.0°F | Ht 68.0 in | Wt 209.8 lb

## 2020-12-10 DIAGNOSIS — E1169 Type 2 diabetes mellitus with other specified complication: Secondary | ICD-10-CM | POA: Diagnosis not present

## 2020-12-10 DIAGNOSIS — E119 Type 2 diabetes mellitus without complications: Secondary | ICD-10-CM | POA: Diagnosis not present

## 2020-12-10 DIAGNOSIS — Z8042 Family history of malignant neoplasm of prostate: Secondary | ICD-10-CM | POA: Diagnosis not present

## 2020-12-10 DIAGNOSIS — R3912 Poor urinary stream: Secondary | ICD-10-CM

## 2020-12-10 DIAGNOSIS — E1159 Type 2 diabetes mellitus with other circulatory complications: Secondary | ICD-10-CM | POA: Diagnosis not present

## 2020-12-10 DIAGNOSIS — E785 Hyperlipidemia, unspecified: Secondary | ICD-10-CM

## 2020-12-10 DIAGNOSIS — I152 Hypertension secondary to endocrine disorders: Secondary | ICD-10-CM | POA: Diagnosis not present

## 2020-12-10 DIAGNOSIS — I83893 Varicose veins of bilateral lower extremities with other complications: Secondary | ICD-10-CM

## 2020-12-10 DIAGNOSIS — Z23 Encounter for immunization: Secondary | ICD-10-CM | POA: Diagnosis not present

## 2020-12-10 DIAGNOSIS — R351 Nocturia: Secondary | ICD-10-CM

## 2020-12-10 DIAGNOSIS — I2510 Atherosclerotic heart disease of native coronary artery without angina pectoris: Secondary | ICD-10-CM

## 2020-12-10 DIAGNOSIS — N2581 Secondary hyperparathyroidism of renal origin: Secondary | ICD-10-CM

## 2020-12-10 LAB — POCT GLYCOSYLATED HEMOGLOBIN (HGB A1C): Hemoglobin A1C: 8.9 % — AB (ref 4.0–5.6)

## 2020-12-10 MED ORDER — BASAGLAR KWIKPEN 100 UNIT/ML ~~LOC~~ SOPN: 5.0000 [IU] | PEN_INJECTOR | Freq: Every day | SUBCUTANEOUS | 1 refills | Status: DC

## 2020-12-10 NOTE — Progress Notes (Signed)
Phone [phone] In person visit   Subjective:   Edgar Brewer is a 60 y.o. year old very pleasant male patient who presents for/with See problem oriented charting Chief Complaint  Patient presents with   Follow-up   Congestive Heart Failure   Hypertension   Diabetes   Hyperlipidemia   Gastroesophageal Reflux    This visit occurred during the SARS-CoV-2 public health emergency.  Safety protocols were in place, including screening questions prior to the visit, additional usage of staff PPE, and extensive cleaning of exam room while observing appropriate contact time as indicated for disinfecting solutions.   Past Medical History-  Patient Active Problem List   Diagnosis Date Noted   History of stroke 02/25/2020    Priority: High   ESRD (end stage renal disease) on dialysis ([place]) 10/03/2018    Priority: High   Diabetes mellitus type II, non insulin dependent (Edgar Brewer) 01/14/2015    Priority: High   Chronic diastolic CHF (congestive heart failure) (Edgar Brewer) 01/13/2015    Priority: High   GERD (gastroesophageal reflux disease) 03/25/2020    Priority: Medium    Aortic stenosis 02/25/2020    Priority: Medium    Type 2 diabetes mellitus with diabetic neuropathy, unspecified (Edgar Brewer) 02/25/2020    Priority: Medium    Gallbladder abscess 11/15/2018    Priority: Medium    Elevated LFTs 10/18/2018    Priority: Medium    Other disorders of phosphorus metabolism 01/19/2018    Priority: Medium    Secondary hyperparathyroidism of renal origin (Edgar Brewer) 09/27/2017    Priority: Medium    Hypertension associated with diabetes (Edgar Brewer) 01/14/2015    Priority: Medium    Anemia of chronic kidney failure 01/14/2015    Priority: Medium    Varicose veins of lower extremities with complications 32/67/1245    Priority: Medium    CKD (chronic kidney disease), stage IV (Edgar Brewer) 01/14/2015    Priority: Low   Obesity 01/14/2015    Priority: Low   Chronic venous insufficiency 10/22/2014    Priority: Low    Acute respiratory failure with hypoxia (Edgar Brewer) 05/17/2020   Wound of left leg 05/08/2020   Left leg cellulitis 05/08/2020    Medications- reviewed and updated Current Outpatient Medications  Medication Sig Dispense Refill   aspirin EC 81 MG tablet Take 81 mg by mouth daily. Swallow whole.     b complex-vitamin c-folic acid (NEPHRO-VITE) 0.8 MG TABS tablet Take 1 tablet by mouth daily.     cinacalcet (SENSIPAR) 60 MG tablet Take 60 mg by mouth See admin instructions. Take one tablet by mouth on Tues,Thurs Saturday and sundays only per patient     Insulin Glargine (BASAGLAR KWIKPEN) 100 UNIT/ML Inject 5-10 Units into the skin daily. Please provide pen needles 15 mL 1   omeprazole (PRILOSEC) 20 MG capsule Take 1 capsule (20 mg total) by mouth daily. 90 capsule 3   rosuvastatin (CRESTOR) 10 MG tablet Take 1 tablet (10 mg total) by mouth daily. 90 tablet 3   sevelamer carbonate (RENVELA) 800 MG tablet Take 2 tablets (1,600 mg total) by mouth 3 (three) times daily with meals.     No current facility-administered medications for this visit.     Objective:  BP 132/62   Pulse 85   Temp 98 F (36.7 C)   Ht 5\' 8"  (1.727 m)   Wt 209 lb 12.8 oz (95.2 kg)   SpO2 97%   BMI 31.90 kg/m  Gen: NAD, resting comfortably    Assessment and Plan      #  End-stage renal disease on hemodialysis since January 2019. thorugh related to diabtes #Secondary hyperparathyroidism S: Patient on dialysis followed by Dr. Lorrene Reid on MWF at [facility] site  -AV fistula 06/01/2016 by Dr. Donnetta Hutching -Patient compliant with Sensipar for secondary hyperparathyroidism -On Renvela phosphate binder A/P: Patient will continue on Monday Wednesday Friday dialysis follow-up with nephrology  #hypertension S: medication: none at present BP Readings from Last 3 Encounters:  12/10/20 132/62  10/20/20 126/71  09/17/20 110/60  A/P: Blood pressure control without medication-continue to monitor  #Aortic stenosis S:Last echocardiogram  August 2022-reported as very mild considering fistula A/P: Mild once again-repeat echo in 1 year  # Diabetes with neuropathy-A1c goal of 7.5 or less is reasonable S: Medication:januvia 25 mg for diabetes apparently through dialysis-Tradjenta listed on MAR medications.  In the past-gabapentin 300mg  BID for neuropathy-recommended 300 mg max per day which she did not tolerate and we ultimately stopped CBGs- 180s in the morning, after meals over 250 Exercise and diet-not doing well with dietary changes Lab Results  Component Value Date   HGBA1C 8.9 (A) 12/10/2020   HGBA1C 8.1 (A) 09/08/2020   HGBA1C 7.2 (H) 05/18/2020  A/P: Very poorly controlled diabetes-A1c worsening-his nephrologist wrote a note requesting advance therapy -start basaglar 5-10 units- will start with 5 units and update me in 1 week. Let me know ASAP if any sugar under 90 -hold januvia 25 mg for now  Dialysis physician apparently requested a TSH as well-this will be ordered  #history of stroke- now on aspirin 81mg - stutter lingers from this #hyperlipidemia S: Medication:rosuvastatin 10 mg daily-I do not believe below labs or on statin Lab Results  Component Value Date   CHOL 212 (H) 02/25/2020   HDL 38.40 (L) 02/25/2020   LDLDIRECT 64.0 02/25/2020   TRIG (H) 02/25/2020    433.0 Triglyceride is over 400; calculations on Lipids are invalid.   CHOLHDL 6 02/25/2020  A/P: LDL has been at goal but triglycerides have been high-working on blood sugar should be beneficial plus improving lifestyle may help-hoping for further improvement on rosuvastatin 10 mg -Do need to recheck LFTs now on statin with history of elevated LFTs including GGT-I suspect fatty liver  History- #Elevated LFTs S: Patient with laparoscopic cholecystectomy September 3382 complicated by gallbladder fossa abscess status post percutaneous aspiration and JP drain in October 2020. Ultimately developed sepsis with bacteremia. LFTs were slowly improving per  notes on February 07, 2019 visit with Dr. Dyann Kief was for repeat LFTs in 3 months, avoid hepatotoxins, NSAIDs, alcohol, OTC meds -They also are planning for repeat colonoscopy next visit  # Chronic Bilateral Knee Pain Chart reviewed but issue not discussed during visit today Last visit patient reported having pain in both knees and stated cardiology advised x-ray/further workup.  We referred him to sports medicine at last visit  09/17/2020 patient was seen but Dr. Georgina Snell at [place] where both [facility] [place] and X-ray were obtained for bilateral knees.  -X-ray of bilateral knees showed  Mild medial compartment DJD.  No acute fractures noted.  Ultrasound with no obvious ligamentous damage-likely DJD - Plan initially was for patient to start steroid injections however his diabetes was not controlled as Dr. Georgina Snell would have wanted - puts him at high risk for hyperglycemia.  Plan was to work to get authorization for hyaluronic acid injections since this would be a safer route  - For his lower leg pain, Dr. Georgina Snell stated patient was at a high risk for peripheral arterial disease - he did not  think his symptoms were mostly due to claudication but thought it was still worth checking an ABI to be sure vascular claudication was no the cause. Thought symptoms were more likely related to peripheral neuropathy.  ABI could not be completed and patient ultimately referred to vascular surgery for their expertise-vascular surgery thought symptoms were more related to OA or neuropathy-advised risk factor modification and follow-up in 1 year  #Heart failure with preserved ejection fraction-follows with Dr. [name] Status noted but not specifically discussed today-continues to be managed by dialysis-weight was up slightly  # former smoker- get AAA scan at 16 . Offered lung cancer screening program - he opts out  #Colonoscopy-he opts out of this due to cost traditionally  Recommended follow up: Return in about 14  weeks (around 03/18/2021) for follow up- or sooner if needed.    Lab/Order associations:   ICD-10-CM   1. Hypertension associated with diabetes ([place])  E11.59    I15.2     2. Varicose veins of both lower extremities with complications  P79.432     3. Diabetes mellitus type II, non insulin dependent (HCC)  E11.9 POCT HgB A1C    4. Hyperlipidemia associated with type 2 diabetes mellitus (HCC)  E11.69 Comprehensive metabolic panel   X61.4 LDL cholesterol, direct    TSH    5. Secondary hyperparathyroidism of renal origin ([place])  N25.81     6. Nocturia  R35.1     7. Family history of prostate cancer  Z80.42 PSA    8. Poor urinary stream  R39.12 PSA    9. Need for immunization against influenza  Z23 Flu Vaccine QUAD 87mo+IM (Fluarix, Fluzone & Alfiuria Quad PF)      Meds ordered this encounter  Medications   DISCONTD: Insulin Glargine (BASAGLAR KWIKPEN) 100 UNIT/ML    Sig: Inject 5-10 Units into the skin daily.    Dispense:  15 mL    Refill:  1   Insulin Glargine (BASAGLAR KWIKPEN) 100 UNIT/ML    Sig: Inject 5-10 Units into the skin daily. Please provide pen needles    Dispense:  15 mL    Refill:  1   I,Harris Phan,acting as a scribe for Garret Reddish, MD.,have documented all relevant documentation on the behalf of Garret Reddish, MD,as directed by  Garret Reddish, MD while in the presence of Garret Reddish, MD.    I, Garret Reddish, MD, have reviewed all documentation for this visit. The documentation on 12/10/20 for the exam, diagnosis, procedures, and orders are all accurate and complete.   Return precautions advised.  Garret Reddish, MD

## 2020-12-10 NOTE — Patient Instructions (Addendum)
Health Maintenance Due  Topic Date Due   OPHTHALMOLOGY EXAM   Sign release of information at the check out desk for Last diabetic eye exam Never done   Zoster Vaccines- Shingrix (1 of 2) Please check with your pharmacy to see if they have the shingrix vaccine. If they do- please get this immunization and update [place] by phone call or mychart with dates you receive the vaccine  Never done   COVID-19 Vaccine (4 - Booster for Moderna series) - Recommend getting Omicron/Bivalent booster at your local pharmacy - we do not have this available in our office. Please let us know when you have received this vaccination. Sorry we do not have this in clinic  03/19/2020   INFLUENZA VACCINE  - Regular flu shot today.  08/24/2020   Thanks for getting you blood drawn in the room today If you have mychart- we will send your results within 3 business days of [place] receiving them.  If you do not have mychart- we will call you about results within 5 business days of [place] receiving them.  *please also note that you will see labs on mychart as soon as they post. I will later go in and write notes on them- will say "notes from Dr. Yong Channel"  Start insulin Glargine/Basaglar 5-10 injection units daily - I want you to start with 5 units first morning after you have checked your sugars. Update me within one week to let me know how your blood sugars are doing and let me know if you have any sugars under 90. Hold off on Januvia for no (but leaving on med list)   Recommended follow up: Return in about 14 weeks (around 03/18/2021) for follow up- or sooner if needed.

## 2020-12-11 LAB — COMPREHENSIVE METABOLIC PANEL
ALT: 42 U/L (ref 0–53)
AST: 29 U/L (ref 0–37)
Albumin: 4.5 g/dL (ref 3.5–5.2)
Alkaline Phosphatase: 419 U/L — ABNORMAL HIGH (ref 39–117)
BUN: 34 mg/dL — ABNORMAL HIGH (ref 6–23)
CO2: 27 mEq/L (ref 19–32)
Calcium: 9.6 mg/dL (ref 8.4–10.5)
Chloride: 91 mEq/L — ABNORMAL LOW (ref 96–112)
Creatinine, Ser: 7.27 mg/dL (ref 0.40–1.50)
GFR: 7.51 mL/min — CL (ref >60.00)
Glucose, Bld: 404 mg/dL — ABNORMAL HIGH (ref 70–99)
Potassium: 5.2 mEq/L — ABNORMAL HIGH (ref 3.5–5.1)
Sodium: 135 mEq/L (ref 135–145)
Total Bilirubin: 0.5 mg/dL (ref 0.2–1.2)
Total Protein: 7.3 g/dL (ref 6.0–8.3)

## 2020-12-11 LAB — TSH: TSH: 1.97 u[IU]/mL (ref 0.35–5.50)

## 2020-12-11 LAB — PSA: PSA: 3.9 ng/mL (ref 0.10–4.00)

## 2020-12-11 LAB — LDL CHOLESTEROL, DIRECT: Direct LDL: 49 mg/dL

## 2020-12-14 ENCOUNTER — Telehealth: Payer: Self-pay

## 2020-12-14 MED ORDER — INSULIN PEN NEEDLE 31G X 8 MM MISC: 3 refills | Status: DC

## 2020-12-14 NOTE — Telephone Encounter (Signed)
[name] called stating that Dr Yong Channel prescribed Insulin for Edgar Brewer but there was no needles with the prescription. She would like to know what she should do to get the needles. Please Advise.

## 2020-12-14 NOTE — Telephone Encounter (Signed)
Needles sent to pharmacy

## 2020-12-16 ENCOUNTER — Telehealth: Payer: Self-pay

## 2020-12-16 ENCOUNTER — Other Ambulatory Visit: Payer: Self-pay | Admitting: *Deleted

## 2020-12-16 MED ORDER — OMEPRAZOLE 20 MG PO CPDR
20.0000 mg | DELAYED_RELEASE_CAPSULE | Freq: Every day | ORAL | 3 refills | Status: DC
Start: 2020-12-16 — End: 2022-01-10

## 2020-12-16 NOTE — Telephone Encounter (Signed)
Rx sent to the pharmacy.

## 2020-12-16 NOTE — Telephone Encounter (Signed)
LAST APPOINTMENT DATE:  12/10/20  NEXT APPOINTMENT DATE: 03/30/21  MEDICATION:omeprazole (PRILOSEC) 20 MG capsule  PHARMACY: [facility] [address], [place] [address]

## 2021-01-29 ENCOUNTER — Emergency Department (HOSPITAL_BASED_OUTPATIENT_CLINIC_OR_DEPARTMENT_OTHER): Payer: Medicare Other

## 2021-01-29 ENCOUNTER — Emergency Department (HOSPITAL_BASED_OUTPATIENT_CLINIC_OR_DEPARTMENT_OTHER): Payer: Medicare Other | Admitting: Radiology

## 2021-01-29 ENCOUNTER — Telehealth: Payer: Self-pay

## 2021-01-29 ENCOUNTER — Encounter (HOSPITAL_BASED_OUTPATIENT_CLINIC_OR_DEPARTMENT_OTHER): Payer: Self-pay | Admitting: *Deleted

## 2021-01-29 ENCOUNTER — Other Ambulatory Visit: Payer: Self-pay

## 2021-01-29 ENCOUNTER — Emergency Department (HOSPITAL_BASED_OUTPATIENT_CLINIC_OR_DEPARTMENT_OTHER)
Admission: EM | Admit: 2021-01-29 | Discharge: 2021-01-29 | Disposition: A | Discharge status: Home / Self Care | Payer: Medicare Other | Attending: Emergency Medicine | Admitting: Emergency Medicine

## 2021-01-29 DIAGNOSIS — Z87891 Personal history of nicotine dependence: Secondary | ICD-10-CM | POA: Diagnosis not present

## 2021-01-29 DIAGNOSIS — R112 Nausea with vomiting, unspecified: Secondary | ICD-10-CM | POA: Insufficient documentation

## 2021-01-29 DIAGNOSIS — Z7982 Long term (current) use of aspirin: Secondary | ICD-10-CM | POA: Insufficient documentation

## 2021-01-29 DIAGNOSIS — Z794 Long term (current) use of insulin: Secondary | ICD-10-CM | POA: Insufficient documentation

## 2021-01-29 DIAGNOSIS — I509 Heart failure, unspecified: Secondary | ICD-10-CM | POA: Diagnosis not present

## 2021-01-29 DIAGNOSIS — E1122 Type 2 diabetes mellitus with diabetic chronic kidney disease: Secondary | ICD-10-CM | POA: Diagnosis not present

## 2021-01-29 DIAGNOSIS — R109 Unspecified abdominal pain: Secondary | ICD-10-CM | POA: Diagnosis present

## 2021-01-29 DIAGNOSIS — N186 End stage renal disease: Secondary | ICD-10-CM | POA: Diagnosis not present

## 2021-01-29 DIAGNOSIS — N1330 Unspecified hydronephrosis: Secondary | ICD-10-CM

## 2021-01-29 DIAGNOSIS — I132 Hypertensive heart and chronic kidney disease with heart failure and with stage 5 chronic kidney disease, or end stage renal disease: Secondary | ICD-10-CM | POA: Diagnosis not present

## 2021-01-29 DIAGNOSIS — R140 Abdominal distension (gaseous): Secondary | ICD-10-CM

## 2021-01-29 LAB — COMPREHENSIVE METABOLIC PANEL
ALT: 35 U/L (ref 0–44)
AST: 30 U/L (ref 15–41)
Albumin: 4.2 g/dL (ref 3.5–5.0)
Alkaline Phosphatase: 356 U/L — ABNORMAL HIGH (ref 38–126)
Anion gap: 13 (ref 5–15)
BUN: 39 mg/dL — ABNORMAL HIGH (ref 8–23)
CO2: 29 mmol/L (ref 22–32)
Calcium: 9.1 mg/dL (ref 8.9–10.3)
Chloride: 90 mmol/L — ABNORMAL LOW (ref 98–111)
Creatinine, Ser: 8.74 mg/dL — ABNORMAL HIGH (ref 0.61–1.24)
GFR, Estimated: 6 mL/min — ABNORMAL LOW (ref >60)
Glucose, Bld: 357 mg/dL — ABNORMAL HIGH (ref 70–99)
Potassium: 4.8 mmol/L (ref 3.5–5.1)
Sodium: 132 mmol/L — ABNORMAL LOW (ref 135–145)
Total Bilirubin: 0.6 mg/dL (ref 0.3–1.2)
Total Protein: 7.3 g/dL (ref 6.5–8.1)

## 2021-01-29 LAB — CBG MONITORING, ED
Glucose-Capillary: 342 mg/dL — ABNORMAL HIGH (ref 70–99)
Glucose-Capillary: 186 mg/dL — ABNORMAL HIGH (ref 70–99)

## 2021-01-29 LAB — CBC
HCT: 33.3 % — ABNORMAL LOW (ref 39.0–52.0)
Hemoglobin: 11.4 g/dL — ABNORMAL LOW (ref 13.0–17.0)
MCH: 30.2 pg (ref 26.0–34.0)
MCHC: 34.2 g/dL (ref 30.0–36.0)
MCV: 88.1 fL (ref 80.0–100.0)
Platelets: 194 10*3/uL (ref 150–400)
RBC: 3.78 MIL/uL — ABNORMAL LOW (ref 4.22–5.81)
RDW: 13.4 % (ref 11.5–15.5)
WBC: 6.9 10*3/uL (ref 4.0–10.5)
nRBC: 0 % (ref 0.0–0.2)

## 2021-01-29 LAB — LIPASE, BLOOD: Lipase: 33 U/L (ref 11–51)

## 2021-01-29 MED ORDER — ONDANSETRON HCL 4 MG PO TABS: 4.0000 mg | ORAL_TABLET | Freq: Four times a day (QID) | ORAL | 0 refills | Status: DC | PRN

## 2021-01-29 NOTE — ED Provider Notes (Signed)
[facility] DEPT Provider Note   CSN: [id number] Arrival date & time: 01/29/21  1056     History Chief Complaint  Patient presents with   Abdominal Pain    Edgar Brewer is a 61 y.o. male with history of ESRD on dialysis MWF, type 2 diabetes, anemia, hypertension, CHF presents the emergency department for evaluation of gradually worsening abdominal distention for the past week.  Patient reports he had 2 episodes of vomiting today.  Patient's mom voiced her concern.  The patient reports it is not painful, just feels like pressure throughout his abdomen.  When asked if he makes urine he reports that he occasionally can "mainly in the morning" but cannot tell me if this happens every day or every other day.  He denies any nausea, diarrhea, or constipation.  He denies any fever, chest pain, or shortness of breath.  He reports he missed dialysis today because of the abdominal distention, but attended the rest of his dialysis sessions this week.  Medical history as listed above.  Surgical history includes cholecystectomy, AV fistula.  Allergic to clindamycin and codeine.  Former smoker.   Abdominal Pain Associated symptoms: vomiting   Associated symptoms: no chest pain, no chills, no constipation, no cough, no diarrhea, no dysuria, no fever, no hematuria, no nausea, no shortness of breath and no sore throat       Home Medications Prior to Admission medications   Medication Sig Start Date End Date Taking? Authorizing Provider  ondansetron (ZOFRAN) 4 MG tablet Take 1 tablet (4 mg total) by mouth every 6 (six) hours as needed for nausea or vomiting. 01/29/21  Yes Sherrell Puller, PA-C  aspirin EC 81 MG tablet Take 81 mg by mouth daily. Swallow whole.    [provider]  b complex-vitamin c-folic acid (NEPHRO-VITE) 0.8 MG TABS tablet Take 1 tablet by mouth daily.    [provider]  cinacalcet (SENSIPAR) 60 MG tablet Take 60 mg by mouth See admin instructions.  Take one tablet by mouth on Tues,Thurs Saturday and sundays only per patient    [provider]  Insulin Glargine (BASAGLAR KWIKPEN) 100 UNIT/ML Inject 5-10 Units into the skin daily. Please provide pen needles 12/10/20   Marin Olp, MD  Insulin Pen Needle 31G X 8 MM MISC Use to inject insulin into the skin daily. Dx: E11.9 12/14/20   Marin Olp, MD  omeprazole (PRILOSEC) 20 MG capsule Take 1 capsule (20 mg total) by mouth daily. 12/16/20   Marin Olp, MD  rosuvastatin (CRESTOR) 10 MG tablet Take 1 tablet (10 mg total) by mouth daily. 09/08/20   Marin Olp, MD  sevelamer carbonate (RENVELA) 800 MG tablet Take 2 tablets (1,600 mg total) by mouth 3 (three) times daily with meals. 10/22/18   Aline August, MD      Allergies    Codeine and Clindamycin/lincomycin    Review of Systems   Review of Systems  Constitutional:  Negative for chills and fever.  HENT:  Negative for ear pain and sore throat.   Eyes:  Negative for pain and visual disturbance.  Respiratory:  Negative for cough and shortness of breath.   Cardiovascular:  Negative for chest pain and palpitations.  Gastrointestinal:  Positive for abdominal pain and vomiting. Negative for blood in stool, constipation, diarrhea and nausea.  Genitourinary:  Negative for dysuria and hematuria.  Musculoskeletal:  Negative for arthralgias and back pain.  Skin:  Negative for color change and rash.  Neurological:  Negative for seizures and syncope.  All other systems reviewed and are negative.  Physical Exam Updated Vital Signs BP 131/68 (BP Location: Right Arm)    Pulse 89    Temp 98.1 F (36.7 C)    Resp 18    Ht 5\' 8"  (1.727 m)    Wt 93.6 kg    SpO2 98%    BMI 31.38 kg/m  Physical Exam Vitals and nursing note reviewed.  Constitutional:      General: He is not in acute distress.    Appearance: He is not ill-appearing or toxic-appearing.  Cardiovascular:     Rate and Rhythm: Normal rate and regular rhythm.   Pulmonary:     Effort: Pulmonary effort is normal. No respiratory distress.     Breath sounds: Normal breath sounds.  Abdominal:     General: Bowel sounds are normal. There is distension.     Tenderness: There is no abdominal tenderness. There is no guarding or rebound.     Comments: Nontender to palpation.  No overlying skin changes noted.  Normoactive bowel sounds in all quadrants.  Abdomen is distended, but still soft upon palpation.  No rigidity.  No peritoneal signs.  Skin:    General: Skin is warm and dry.  Neurological:     General: No focal deficit present.     Mental Status: He is alert and oriented to person, place, and time.    ED Results / Procedures / Treatments   Labs (all labs ordered are listed, but only abnormal results are displayed) Labs Reviewed  COMPREHENSIVE METABOLIC PANEL - Abnormal; Notable for the following components:      Result Value   Sodium 132 (*)    Chloride 90 (*)    Glucose, Bld 357 (*)    BUN 39 (*)    Creatinine, Ser 8.74 (*)    Alkaline Phosphatase 356 (*)    GFR, Estimated 6 (*)    All other components within normal limits  CBC - Abnormal; Notable for the following components:   RBC 3.78 (*)    Hemoglobin 11.4 (*)    HCT 33.3 (*)    All other components within normal limits  CBG MONITORING, ED - Abnormal; Notable for the following components:   Glucose-Capillary 342 (*)    All other components within normal limits  CBG MONITORING, ED - Abnormal; Notable for the following components:   Glucose-Capillary 186 (*)    All other components within normal limits  LIPASE, BLOOD    EKG None  Radiology CT ABDOMEN PELVIS WO CONTRAST  Result Date: 01/29/2021 CLINICAL DATA:  Evaluate for bowel obstruction. Abdominal distension. Dialysis patient. EXAM: CT ABDOMEN AND PELVIS WITHOUT CONTRAST TECHNIQUE: Multidetector CT imaging of the abdomen and pelvis was performed following the standard protocol without IV contrast. COMPARISON:  11/29/2018  FINDINGS: Lower chest: No acute abnormality. Hepatobiliary: Status post cholecystectomy. No bile duct dilatation. No focal liver abnormality. Pancreas: Unremarkable. No pancreatic ductal dilatation or surrounding inflammatory changes. Spleen: The spleen appears enlarged measuring 14.4 cm cranial caudal. Adrenals/Urinary Tract: Normal adrenal glands. There is mild right hydronephrosis and moderate right hydroureter. Within the distal portion of the dilated right ureter there is a long segment of increased attenuation within the ureter which may reflect blood clot or underlying urothelial lesion. This is best seen on image 62/7 and image 49/5. No calcification identified along the course of the right ureter or within the urinary bladder. No left-sided hydronephrosis or hydroureter. No left renal calculi. Bladder  is unremarkable. Stomach/Bowel: Stomach is within normal limits. Appendix appears normal. No evidence of bowel wall thickening, distention, or inflammatory changes. Diffuse colonic diverticulosis identified. Most numerous within the sigmoid colon. No signs of acute diverticulitis. Vascular/Lymphatic: Aortic atherosclerosis without aneurysm. Small, scattered retroperitoneal lymph nodes identified without adenopathy. These measure up to 9 mm. No pelvic or inguinal adenopathy Reproductive: Prostate is unremarkable. Other: No significant free fluid or fluid collections. Musculoskeletal: No acute or significant osseous findings. Degenerative disc disease noted at L5-S1. Ovoid cystic structure within the subcutaneous soft tissues overlying the anterior left hip measures 2.7 cm and likely represents a benign epidermal inclusion cyst. IMPRESSION: 1. There is mild right hydronephrosis and moderate right hydroureter. Perinephric and Periureteral fat stranding is noted within the distal portion of the dilated right ureter there is a long segment of increased attenuation within the distal portion of the dilated right  ureter which may reflect blood clot or underlying urothelial lesion. Consider more definitive characterization with hematuria protocol CT without and with contrast material including delayed imaging through the ureters. 2. Splenomegaly. 3. Colonic diverticulosis without signs of acute diverticulitis. 4. Aortic Atherosclerosis (ICD10-I70.0). Electronically Signed   By: Kerby Moors M.D.   On: 01/29/2021 18:38   DG Chest 2 View  Result Date: 01/29/2021 CLINICAL DATA:  abdominal distention EXAM: CHEST - 2 VIEW COMPARISON:  April 2022 FINDINGS: The cardiomediastinal silhouette is within normal limits. Increased prominence of the central pulmonary vasculature. No pleural effusion. No pneumothorax. No mass or consolidation. No acute osseous abnormality. IMPRESSION: Pulmonary vascular congestion without overt edema. No effusion or consolidation. Electronically Signed   By: Albin Felling M.D.   On: 01/29/2021 15:45   DG Abdomen 1 View  Result Date: 01/29/2021 CLINICAL DATA:  abdominal distention EXAM: ABDOMEN - 1 VIEW COMPARISON:  None. FINDINGS: Nonobstructive bowel gas pattern. No abnormal calcification or unexpected radiopaque foreign body. The osseous structures are unremarkable. IMPRESSION: No acute radiographic abnormality in the abdomen. Electronically Signed   By: Albin Felling M.D.   On: 01/29/2021 15:46     Procedures Procedures   Medications Ordered in ED Medications - No data to display  ED Course/ Medical Decision Making/ A&P                           Medical Decision Making  61 y/o M presents to the ED for evaluation of abdominal distention and 2 episodes of vomiting this AM. Differential diagnosis includes but is not limited to SBO, viral gastroenteritis, CHF exacerbation, pancreatitis, hepatic failure. Vital signs show normotension, normal heart rate, afebrile, satting 98% on room air. No tachypnea, no increased work of breathing. Physical exam shows abdominal distention, but no  peritoneal signs. No overlying skin changes noted. Non-tender to palpation. NBS. Lungs are clear to auscultation bilaterally. Moist mucous membranes. Labs and imaging ordered.   Lipase 33.  CBC shows mild anemia, although is slightly higher than patient's previous CBCs from 8 months ago with prior record review.  No leukocytosis.  Normal platelets.  For CMP, shows slight hyponatremia however patient's glucose is elevated at 37.  With using a sodium corrected for hyperglycemia, the patient's sodium ranges from 136-138.  Decrease chloride to 90.  Patient's creatinine is 8.74 with an increased BUN at 39.  Slightly worsened creatinine and BUN from previous measurement a month ago.  Patient has an increase alkaline phosphatase, however this has been elevated in his previous CMP's.  A BNP would not be  useful in this scenario as the patient is a dialysis patient.  For imaging, chest x-ray obtained and shows pulmonary congestion without overt edema.  X-ray of the abdomen shows no acute radiographic abnormality of the abdomen.  CT of the abdomen and reads: I  IMPRESSION: 1. There is mild right hydronephrosis and moderate right hydroureter. Perinephric and Periureteral fat stranding is noted within the distal portion of the dilated right ureter there is a long segment of increased attenuation within the distal portion of the dilated right ureter which may reflect blood clot or underlying urothelial lesion. Consider more definitive characterization with hematuria protocol CT without and with contrast material including delayed imaging through the ureters. 2. Splenomegaly. 3. Colonic diverticulosis without signs of acute diverticulitis. 4. Aortic Atherosclerosis (ICD10-I70.0).  No significant fluid collections noted.  Lymphadenopathy also seen in the retroperitoneal area.  Given the abdominal CT results, consult to urology placed.  Urology consult return promptly and spoke to Dr. Louis Meckel who discussed that this  is the likelihood of cancer, but he can follow-up outpatient with his office for this.  The patient has not had any vomiting episodes since being here and is requesting food.  The patient ate multiple packs of crackers, 2 cups of soup, and a soda without nausea or vomiting.  Given the patient's lab work and imaging showing no fluid collection as well as his reassuring vital signs, the patient is safe to discharge with close follow up with urology.  I discussed the results with the patient and family in the room.  The family would like admission for removal of the fluid "like they did last year".  On chart review, the patient had fluid overload and was hypoxic which is not the same presentation he is experiencing today as he is satting 98% on room air and speaking full sentences with ease.  No tachypnea present.  At this time, I had my attending discussed with them why an admission would not be appropriate and that they need to follow-up with urology. Zofran prescribed.  Return precautions given.  Patient agrees to plan.  Patient is stable be discharged home in good condition.  I discussed this case with my attending physician who cosigned this note including patient's presenting symptoms, physical exam, and planned diagnostics and interventions. Attending physician stated agreement with plan or made changes to plan which were implemented.     Final Clinical Impression(s) / ED Diagnoses Final diagnoses:  Abdominal distention  Nausea and vomiting, unspecified vomiting type    Rx / DC Orders ED Discharge Orders          Ordered    ondansetron (ZOFRAN) 4 MG tablet  Every 6 hours PRN        01/29/21 2123              Sherrell Puller, PA-C 02/01/21 1023    Dorie Rank, MD 02/02/21 [phone]

## 2021-01-29 NOTE — Discharge Instructions (Addendum)
You are seen in today for evaluation of your abdominal distention and vomiting. Your blood work was consistent with your previous blood work.  Your CT scan showed some abnormality with your right ureter that urology would like you to follow-up with soon.  Information of the urologists included in this discharge paperwork.  You will need to call to schedule an appointment soon.  Additionally, I prescribed you Zofran, an antinausea medication to take every 6 hours as needed for nausea and vomiting.  You will need to call your primary care doctor, nephrologist, or dialysis center to see if they can get you in for dialysis tomorrow since you missed today.  If you have any concern, new or worsening symptoms such as chest pain, shortness of breath, dark/tarry stools, please return to the nearest emergency department for evaluation.

## 2021-01-29 NOTE — ED Notes (Signed)
EMT-P provided AVS using Teachback Method. Patient verbalizes understanding of Discharge Instructions. Opportunity for Questioning and Answers were provided by EMT-P. Patient Discharged from ED.  ? ?

## 2021-01-29 NOTE — ED Triage Notes (Addendum)
Pt is a dialysis M,W,F this past week abd distention from ? Fluid overload. Abd distended and firm

## 2021-01-29 NOTE — ED Notes (Signed)
CBG 186 

## 2021-01-29 NOTE — Telephone Encounter (Signed)
Pt went to ER

## 2021-01-29 NOTE — ED Notes (Signed)
Pt given x2 chicken noodle soup, crackers, and apple juice per request.

## 2021-01-29 NOTE — ED Notes (Signed)
Patient transported to CT 

## 2021-01-29 NOTE — ED Notes (Signed)
Patient given urinal for urine sample. Made aware sample is needed.

## 2021-01-29 NOTE — ED Notes (Signed)
Pt given bottle of contrast to drink; scan time between 1815 and 1915

## 2021-01-29 NOTE — Telephone Encounter (Signed)
Patient Name: Edgar Brewer Gender: Male DOB: 04/15/1960 Age: 61 Y 49 M 11 D Return Phone Number: [phone] (Primary) Address: City/ State/ Zip: [place]  [address] Client [facility] Provider Garret Reddish- MD Contact Type Call Who Is Calling Patient / Member / Family / Caregiver Call Type Triage / Clinical Relationship To Patient Self Return Phone Number [phone] (Primary) Chief Complaint Vomiting Reason for Call Symptomatic / Request for [place] states, dialysis pt. Vomiting and stomach bloating. Office has no appt. today [place] Not Immunologist at [facility] No Nurse Assessment Nurse: D'Heur Lucia Gaskins, RN, Adrienne Date/Time (Eastern Time): 01/29/2021 9:58:27 AM Confirm and document reason for call. If symptomatic, describe symptoms. ---Caller states her husband is a dialysis patient. He is having vomiting and stomach bloating. He has this happen last February or March last year & he had fluid removed. Abdomen is rigid and bloating is severe. He has vomited once this morning. He was heaving during the night. Does the patient have any new or worsening symptoms? ---Yes Will a triage be completed? ---Yes Related visit to physician within the last 2 weeks? ---No Does the PT have any chronic conditions? (i.e. diabetes, asthma, this includes High risk factors for pregnancy, etc.) ---Yes List chronic conditions. ---dialysis patient, diabetes Is this a behavioral health or substance abuse call? ---No Guidelines Guideline Title Affirmed Question Affirmed Notes Nurse Date/Time (Eastern Time) Vomiting High-risk adult (e.g., diabetes mellitus, brain tumor, V-P shunt, hernia) D'Heur Lucia Gaskins, RN, Adrienne 01/29/2021 10:01:35  Disp. Time Eilene Ghazi Time) Disposition Final User 01/29/2021 10:08:02 AM Go  to ED Now (or PCP triage) Yes D'Heur Lucia Gaskins, RN, Ebbie Latus Disagree/Comply Comply Caller Understands Yes PreDisposition Call Doctor Care Advice Given Per Guideline GO TO ED NOW (OR PCP TRIAGE): CARE ADVICE per Vomiting (Adult) guideline. Referrals GO TO FACILITY OTHER - SPECIFY

## 2021-02-01 NOTE — Telephone Encounter (Signed)
Ok to refer? If so, what to order under?

## 2021-02-01 NOTE — Telephone Encounter (Signed)
You may refer under right hydronephrosis to [facility]-Dr. Louis Meckel

## 2021-02-01 NOTE — Telephone Encounter (Signed)
Referral has been placed. 

## 2021-02-01 NOTE — Telephone Encounter (Signed)
They released him Friday night and wanted him to see a urologist and they were wanting to know if they could get a referral for dr Louis Meckel, [facility]. Callback [phone]

## 2021-02-14 IMAGING — MR MR 3D RECON AT [facility]ANNER
10 of 12 series · 42 of 48 positions shown · non-contrast
Comparison: Multiple exams, including CT and ultrasound exams from
[DATE]

CLINICAL DATA: Jaundice, abdominal pain. End stage renal disease.

EXAM:
MRI ABDOMEN WITHOUT CONTRAST  (INCLUDING MRCP)
TECHNIQUE: Multiplanar multisequence MR imaging of the abdomen was performed.
Heavily T2-weighted images of the biliary and pancreatic ducts were
obtained, and three-dimensional MRCP images were rendered by post
processing.

[Series 3: bSSFP · coronal · 6.0mm · 0.74mm/px · 2 of 35 slices shown]
[im 1/35]
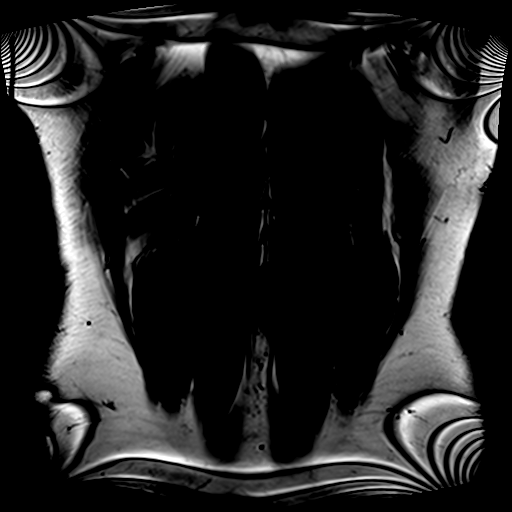
[im 35/35]
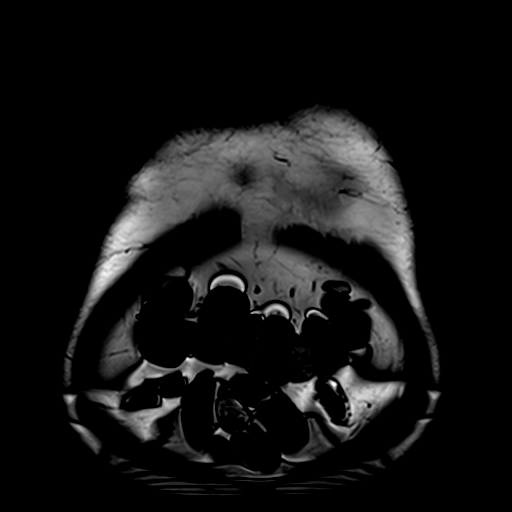

[Series 4: ax haste · axial · 6.0mm · 1.25mm/px · z∈[-192,+89]mm · 2 of 40 slices shown]
[im 1/40]
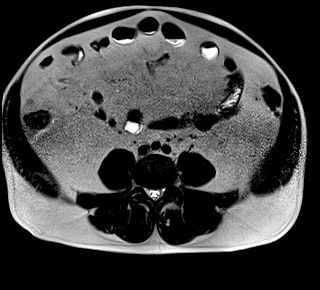
[im 40/40]
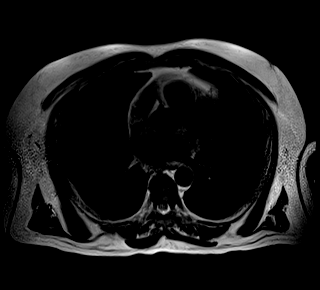

[Series 5: T2 fat-sat · axial · 6.0mm · 1.25mm/px · z∈[-192,+89]mm · 3 of 40 slices shown]
[im 1/40]
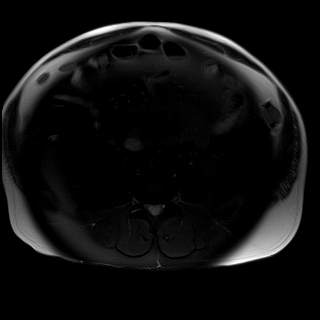
[im 20/40]
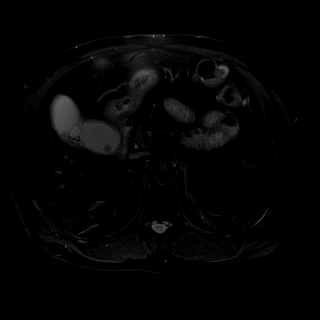
[im 40/40]
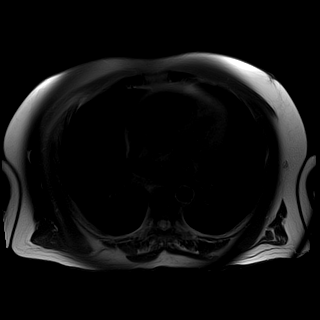

[Series 6: DWI · axial · 6.0mm · 1.49mm/px · z∈[-192,+89]mm · 9 of 120 slices shown (1 of 2)]
[im 1/120]
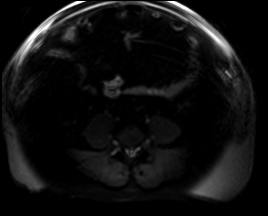
[im 15/120]
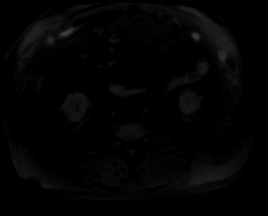
[im 30/120]
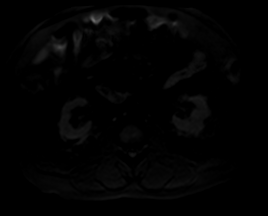
[im 45/120]
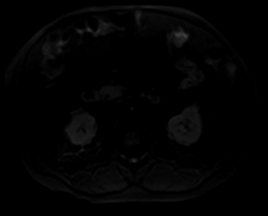
[im 60/120]
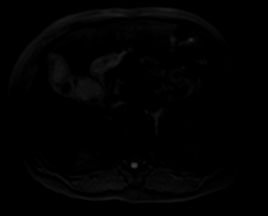
[im 75/120]
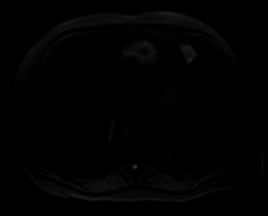
[im 90/120]
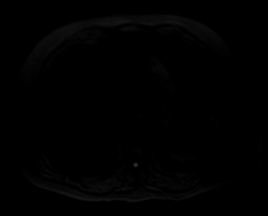
[im 105/120]
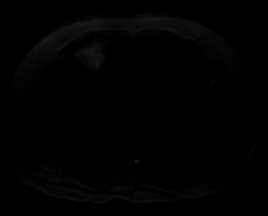
[im 120/120]
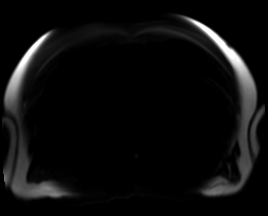

[Series 7: DWI · axial · 6.0mm · 1.49mm/px · z∈[-192,+89]mm · 3 of 40 slices shown (2 of 2)]
[im 1/40]
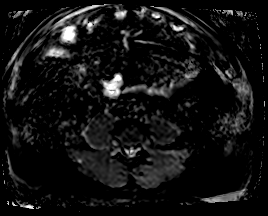
[im 20/40]
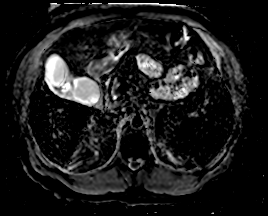
[im 40/40]
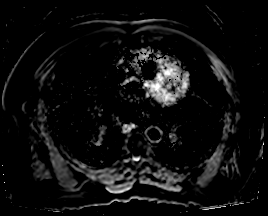

[Series 8: ax in and · axial · 3.0mm · 1.25mm/px · z∈[-179,+82]mm · 7 of 88 slices shown]
[im 1/88]
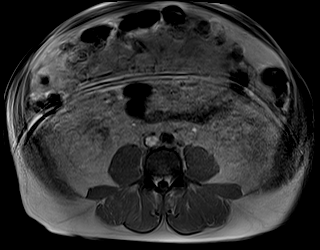
[im 15/88]
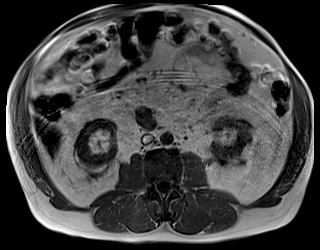
[im 30/88]
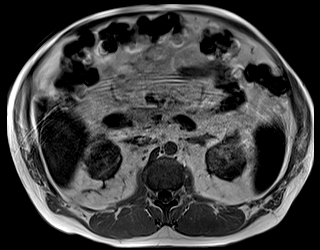
[im 44/88]
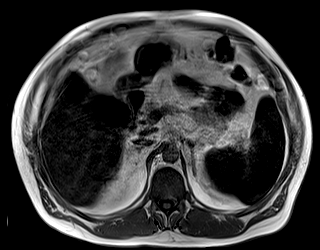
[im 59/88]
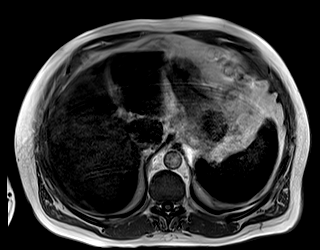
[im 73/88]
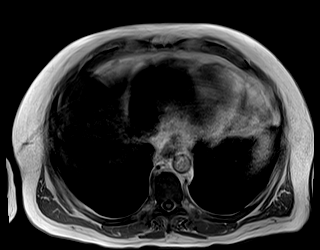
[im 88/88]
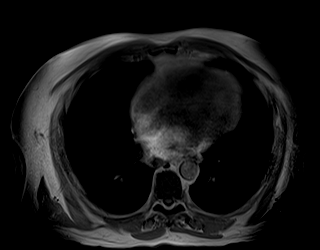

[Series 8: out of phase · axial · 3.0mm · 1.25mm/px · z∈[-179,+82]mm · 7 of 88 slices shown]
[im 1/88]
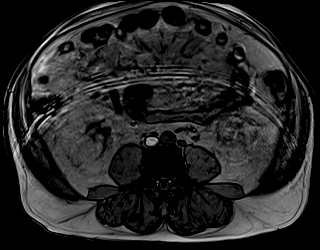
[im 15/88]
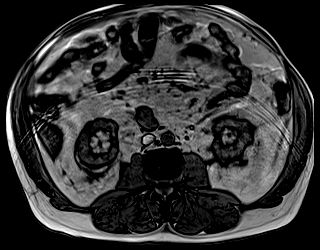
[im 30/88]
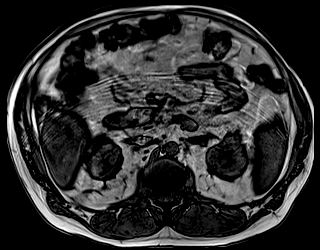
[im 44/88]
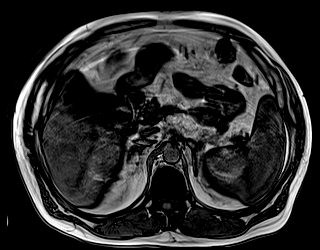
[im 59/88]
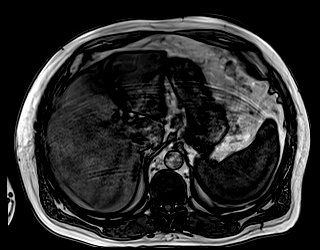
[im 73/88]
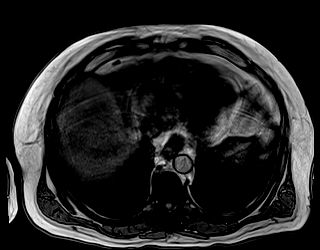
[im 88/88]
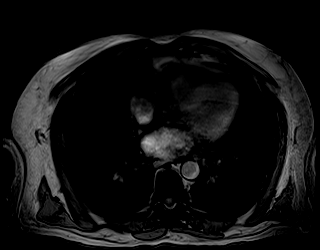

[Series 9: MRCP · coronal · 4.0mm · 1.12mm/px · 1 of 17 slices shown]
[im 1/17]
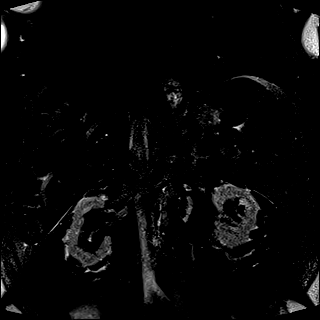

[Series 12: radials · coronal · 50.0mm · 0.78mm/px · 1 of 5 slices shown]
[im 1/5]
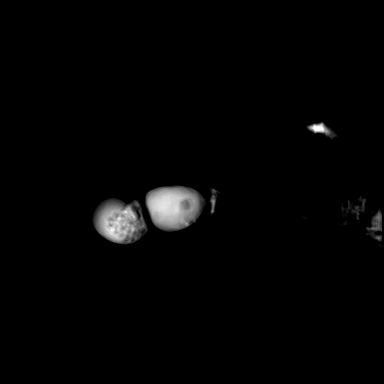

[Series 13: T1 dynamic · axial · non-contrast · 3.0mm · 1.25mm/px · z∈[-179,+82]mm · 7 of 88 slices shown]
[im 1/88]
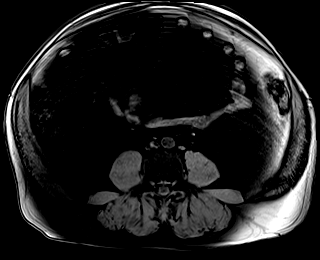
[im 15/88]
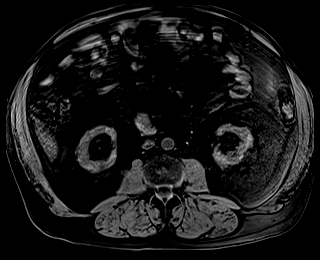
[im 30/88]
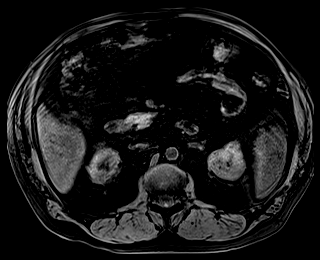
[im 44/88]
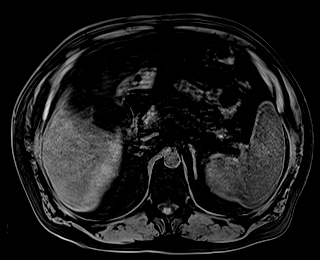
[im 59/88]
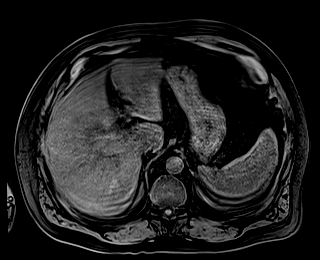
[im 73/88]
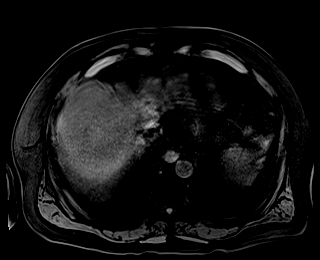
[im 88/88]
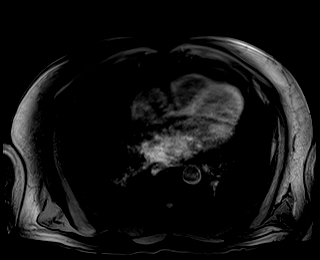

[42 of 48 positions shown; findings below may reference images not displayed]

FINDINGS: Lower chest: Mild cardiomegaly.

Hepatobiliary: Multiple gallstones are present in the gallbladder,
the largest approximately 1.1 cm in diameter. Mild gallbladder wall
thickening is present no biliary dilatation is identified. Subject
to the limitations of motion artifact, no filling defect is
identified in the biliary tree to suggest choledocholithiasis.

There is reduced hepatic and splenic signal on inphase images
compared to out of phase images, raising the possibility of mild
hemochromatosis.

No significant focal liver lesion on today's noncontrast exam.

Pancreas:  Pancreas divisum.  Otherwise unremarkable.

Spleen: Dropout of signal in the spleen on inphase images raising
the possibility of hemosiderosis. The spleen measures 13.4 by 6.0 by
14.1 cm (volume = 590 cm^3).

Adrenals/Urinary Tract: Mild bilateral renal atrophy. Adrenal glands
unremarkable.

Stomach/Bowel: Small diverticulum of the transverse duodenum without
signs of inflammation. Widespread colonic diverticulosis.

Vascular/Lymphatic:  Aortoiliac atherosclerotic vascular disease.

Other:  No supplemental non-categorized findings.

Musculoskeletal: Unremarkable
IMPRESSION: 1. Cholelithiasis and gallbladder wall thickening. Correlate
clinically in assessing for acute cholecystitis.
2. No biliary dilatation or appreciable choledocholithiasis.
3. There is some dropout of signal in both the liver and spleen on
inphase images, potentially from hemosiderosis or hemochromatosis.
4. Pancreas divisum.
5. Colonic diverticulosis.
6.  Aortic Atherosclerosis ([id number]-YD0.0).

## 2021-02-15 ENCOUNTER — Other Ambulatory Visit: Payer: Self-pay | Admitting: Urology

## 2021-02-16 ENCOUNTER — Other Ambulatory Visit (INDEPENDENT_AMBULATORY_CARE_PROVIDER_SITE_OTHER): Payer: Medicare Other

## 2021-02-16 ENCOUNTER — Ambulatory Visit (INDEPENDENT_AMBULATORY_CARE_PROVIDER_SITE_OTHER): Payer: Medicare Other | Admitting: Physician Assistant

## 2021-02-16 ENCOUNTER — Encounter: Payer: Self-pay | Admitting: Physician Assistant

## 2021-02-16 VITALS — BP 114/60 | HR 70 | Ht 68.0 in | Wt 206.0 lb

## 2021-02-16 DIAGNOSIS — R748 Abnormal levels of other serum enzymes: Secondary | ICD-10-CM

## 2021-02-16 DIAGNOSIS — Z86010 Personal history of colonic polyps: Secondary | ICD-10-CM | POA: Diagnosis not present

## 2021-02-16 DIAGNOSIS — R140 Abdominal distension (gaseous): Secondary | ICD-10-CM

## 2021-02-16 DIAGNOSIS — R9389 Abnormal findings on diagnostic imaging of other specified body structures: Secondary | ICD-10-CM

## 2021-02-16 DIAGNOSIS — K219 Gastro-esophageal reflux disease without esophagitis: Secondary | ICD-10-CM

## 2021-02-16 DIAGNOSIS — R112 Nausea with vomiting, unspecified: Secondary | ICD-10-CM

## 2021-02-16 LAB — GAMMA GT: GGT: 213 U/L — ABNORMAL HIGH (ref 7–51)

## 2021-02-16 NOTE — Progress Notes (Signed)
Subjective:    Patient ID: Edgar Brewer, male    DOB: [birth year], 61 y.o.   MRN: [id number]  HPI Edgar Brewer is a 61 year old white male, established with Dr. Silverio Decamp who was last seen here in January 2021.  He has history of hepatic steatosis, had undergone laparoscopic cholecystectomy in September 2500, which was complicated by a postoperative abscess.  He was seen at that time for elevated LFTs.  He had extensive serologic markers done all of which were negative. He comes in today after recent ER visit on 01/29/2021 with complaints of abdominal pain distention bloating nausea and vomiting. He underwent CT of the abdomen and pelvis without IV contrast and was found to have no focal liver abnormality, status postcholecystectomy, enlarged spleen at 14.4 cm, there was no evidence of dilated bowel or bowel wall thickening, diffuse colonic diverticulosis noted.  He had mild hydronephrosis and moderate right hydroureter with perinephric and periureteral fat stranding within the distal portion of the dilated right ureter there was a long segment of increased attenuation within the distal portion of the dilated right ureter which could reflect clot or underlying lesion/neoplasm.  Patient has upcoming appointment on February 2 with urology. He says that he is feeling a lot better since that visit.  He and his mother feel that his symptoms were exacerbated by him increasing his dose of Renvela.  Previously he had been taking 2 tablets with every meal and had been instructed to try to take 1 with every snack.  He had been eating a lot of snacks and was taking a lot more of the Renvela.  They have now decreased to just 2 tablets with meals and his abdominal bloating and gassiness has resolved as has the nausea.  He is not having any diarrhea, says his stools are soft.,  No melena or hematochezia.  Again no complaints of current abdominal discomfort or pain.   He has multiple comorbidities including end-stage renal  disease on dialysis, prior history of CVA, pulmonary hypertension,/congestive heart failure, aortic stenosis, GERD and adult onset diabetes mellitus.  Last colonoscopy was done here in March 2018 with removal of 10 polyps, also noted to have multiple diverticuli and internal hemorrhoids.  The largest polyp was 20 mm in the sigmoid colon.  Path was consistent with all of the polyps being tubular adenomatous or hyperplastic.  He was to have a 1 year follow-up.  Review of Systems Pertinent positive and negative review of systems were noted in the above HPI section.  All other review of systems was otherwise negative.   Outpatient Encounter Medications as of 02/16/2021  Medication Sig   aspirin EC 81 MG tablet Take 81 mg by mouth daily. Swallow whole.   b complex-vitamin c-folic acid (NEPHRO-VITE) 0.8 MG TABS tablet Take 1 tablet by mouth in the morning.   cinacalcet (SENSIPAR) 60 MG tablet Take 60 mg by mouth every Tuesday, Thursday, and Saturday at 6 PM. In the evening.   Insulin Glargine (BASAGLAR KWIKPEN) 100 UNIT/ML Inject 5-10 Units into the skin daily. Please provide pen needles (Patient taking differently: Inject 5 Units into the skin daily. Please provide pen needles)   Insulin Pen Needle 31G X 8 MM MISC Use to inject insulin into the skin daily. Dx: E11.9   omeprazole (PRILOSEC) 20 MG capsule Take 1 capsule (20 mg total) by mouth daily. (Patient taking differently: Take 20 mg by mouth daily with supper.)   ondansetron (ZOFRAN) 4 MG tablet Take 1 tablet (4 mg total) by  mouth every 6 (six) hours as needed for nausea or vomiting. (Patient not taking: Reported on 02/16/2021)   rosuvastatin (CRESTOR) 10 MG tablet Take 1 tablet (10 mg total) by mouth daily. (Patient taking differently: Take 10 mg by mouth every evening.)   sevelamer carbonate (RENVELA) 800 MG tablet Take 2 tablets (1,600 mg total) by mouth 3 (three) times daily with meals.   No facility-administered encounter medications on file as  of 02/16/2021.   Allergies  Allergen Reactions   Codeine Anaphylaxis, Hives, Swelling and Other (See Comments)    Swelling all over body and the throat   Clindamycin/Lincomycin Nausea And Vomiting   Patient Active Problem List   Diagnosis Date Noted   Acute respiratory failure with hypoxia ([place]) 05/17/2020   Wound of left leg 05/08/2020   Left leg cellulitis 05/08/2020   GERD (gastroesophageal reflux disease) 03/25/2020   Aortic stenosis 02/25/2020   Type 2 diabetes mellitus with diabetic neuropathy, unspecified ([place]) 02/25/2020   History of stroke 02/25/2020   Gallbladder abscess 11/15/2018   Elevated LFTs 10/18/2018   ESRD (end stage renal disease) on dialysis ([place]) 10/03/2018   Other disorders of phosphorus metabolism 01/19/2018   Secondary hyperparathyroidism of renal origin ([place]) 09/27/2017   CKD (chronic kidney disease), stage IV ([place]) 01/14/2015   Diabetes mellitus type II, non insulin dependent ([place]) 01/14/2015   Obesity 01/14/2015   Hypertension associated with diabetes ([place]) 01/14/2015   Anemia of chronic kidney failure 01/14/2015   Chronic diastolic CHF (congestive heart failure) ([place]) 01/13/2015   Varicose veins of lower extremities with complications 50/03/7046   Chronic venous insufficiency 10/22/2014   Social History   Socioeconomic History   Marital status: Single    Spouse name: Not on file   Number of children: Not on file   Years of education: Not on file   Highest education level: Not on file  Occupational History   Not on file  Tobacco Use   Smoking status: Former    Packs/day: 1.00    Years: 45.00    Pack years: 45.00    Types: Cigarettes    Quit date: 01/13/2015    Years since quitting: 6.0   Smokeless tobacco: Never  Vaping Use   Vaping Use: Never used  Substance and Sexual Activity   Alcohol use: Yes    Alcohol/week: 1.0 standard drink    Types: 1 Glasses of wine per week    Comment: rare occassion   Drug use: No   Sexual activity: Not  Currently  Other Topics Concern   Not on file  Social History Narrative   Separated for many years over 10 years/practical divorce. No Kids. No pets.  Lives with mom Edgar Brewer      Disabled- from dialysis and diabetes. 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 family history includes Arthritis in his mother; Hearing loss in his father; Heart disease in his maternal grandmother; Hyperlipidemia in his maternal grandmother; Lung cancer in his maternal grandfather; Prostate cancer in his father.      Objective:    Vitals:  02/16/21 0901  BP: 114/60  Pulse: 70    Physical Exam.Well-developed well-nourished chronically ill-appearing older white male in no acute distress.  Accompanied by family member , Weight, 206 BMI 31.3  HEENT; nontraumatic normocephalic, EOMI, PE R LA, sclera anicteric. Oropharynx; not examined today Neck; supple, no JVD Cardiovascular; regular rate and rhythm with S1-S2, no murmur rub or gallop Pulmonary; Clear bilaterally Abdomen; soft,  nondistended, nontender, no palpable mass or hepatosplenomegaly, bowel sounds are somewhat hyperactive Rectal; not done today Skin; benign exam, no jaundice rash or appreciable lesions Extremities; no clubbing cyanosis or edema skin warm and dry Neuro/Psych; alert and oriented x4, grossly nonfocal mood and affect appropriate        Assessment & Plan:   #73 61 year old white male with recent ER visit with complaints of abdominal pain bloating distention nausea and vomiting 01/29/2021.  Work-up there was unremarkable for any acute GI etiology with CT of the abdomen  pelvis showing no evidence of any dilated or distended small bowel or colon. He was noted to have abnormality of the right ureter with periureteral fat stranding and perinephric stranding on the right, there was concern for potential lesion in the distal right ureter Patient is scheduled for urologic consultation on February 2.  His GI symptoms have resolved.  His mother who accompanies him says he was taking an increased dose of Renvela, 2 with meals and was also taking 1 with snacks and frequently snacking.  Renvela is associated with multiple GI side effects including nausea vomiting dyspepsia abdominal pain gas diarrhea and ileus.  They have decreased the dose of Renvela to a previous dose of 2 tablets just with meals and none with snacks in his GI symptoms have resolved.  I think it is very likely this was the etiology of his symptoms.  #2 end-stage renal disease on dialysis 3.  Prior history of CVA 4.  Adult onset diabetes mellitus 5.  Status postcholecystectomy complicated by abscess 1155 #6 aortic stenosis 7.  Pulmonary hypertension/congestive heart failure 8.  Hypertension  #9 history of multiple colon polyps at colonoscopy March 2018-10 polyps removed at that time the largest 20 mm and all of the polyps were tubular adenomatous without high-grade dysplasia or hyperplastic.  He was to have 1 year interval follow-up and has not had colonoscopy since that time. #10 history of elevated LFTs-recent labs show elevated alk phos at 356 transaminases within normal limits  Plan; continue Renvela at current dose of 2 tablets with each meal. Continue omeprazole 20 mg every morning Will check GGT and 5 prime nucleotidase today to further evaluate the elevated alkaline phosphatase. We discussed need for follow-up colonoscopy however with the recent abnormality found on CT scan and concern for a possible ureteral neoplasm we will hold on scheduling colonoscopy until he undergoes urologic evaluation  with initial consult scheduled for next week. I asked them to follow-up with Dr. Silverio Decamp in 3 months, and will need to be scheduled for follow-up colonoscopy at that time which will need to be done at the hospital given dialysis and other comorbidities.    Kenslei Hearty Genia Harold PA-C 02/16/2021   Cc: Marin Olp, MD

## 2021-02-16 NOTE — Patient Instructions (Addendum)
If you are age 62 or younger, your body mass index should be between 19-25. Your Body mass index is 31.32 kg/m. If this is out of the aformentioned range listed, please consider follow up with your Primary Care Provider.  ________________________________________________________  The [facility] providers would like to encourage you to use [facility] to communicate with providers for non-urgent requests or questions.  Due to long hold times on the telephone, sending your provider a message by [facility] may be a faster and more efficient way to get a response.  Please allow 48 business hours for a response.  Please remember that this is for non-urgent requests.  _______________________________________________________  Your provider has requested that you go to the basement level for lab work before leaving today. Press "B" on the elevator. The lab is located at the first door on the left as you exit the elevator.  Continue Omeprazole 20 mg every morning  Call the office in the next month or to schedule a follow up with Dr. Silverio Decamp.  Thank you for entrusting me with your care and choosing [facility].  Amy Esterwood, PA-C

## 2021-02-16 NOTE — Progress Notes (Signed)
Sent message, via epic in basket, requesting orders in epic from surgeon.  

## 2021-02-17 NOTE — Progress Notes (Signed)
Reviewed and agree with documentation and assessment and plan. K. Veena Curtistine Pettitt , MD   

## 2021-02-18 NOTE — Patient Instructions (Addendum)
DUE TO COVID-19 ONLY ONE VISITOR IS ALLOWED TO COME WITH YOU AND STAY IN THE WAITING ROOM ONLY DURING PRE OP AND PROCEDURE DAY OF SURGERY IF YOU ARE GOING HOME AFTER SURGERY. IF YOU ARE SPENDING THE NIGHT 2 PEOPLE MAY VISIT WITH YOU IN YOUR PRIVATE ROOM AFTER SURGERY UNTIL VISITING  HOURS ARE OVER AT 800 PM AND 1  VISITOR  MAY  SPEND THE NIGHT.                 Edgar Brewer     Your procedure is scheduled on: 02/25/21   Report to [facility] Main  Entrance   Report to admitting at  10:30 AM     Call this number if you have problems the morning of surgery [phone]    Remember: Do not eat food  :After Midnight the night before your surgery,   You may have clear liquids from midnight until 6:45 am    CLEAR LIQUID DIET   Foods Allowed                                                                     Foods Excluded  Coffee and tea, regular and decaf                             liquids that you cannot  Plain Jell-O any favor except red or purple                                           see through such as: Fruit ices (not with fruit pulp)                                     milk, soups, orange juice  Iced Popsicles                                    All solid food Carbonated beverages, regular and diet                                    Cranberry, grape and apple juices Sports drinks like Gatorade Lightly seasoned clear broth or consume(fat free) Sugar     BRUSH YOUR TEETH MORNING OF SURGERY AND RINSE YOUR MOUTH OUT, NO CHEWING GUM CANDY OR MINTS.     Take these medicines the morning of surgery with A SIP OF WATER: none   Continue taking your ASAas instructed by _Dr. Manny____________.   How to Manage Your Diabetes Before and After Surgery  Why is it important to control my blood sugar before and after surgery? Improving blood sugar levels before and after surgery helps healing and can limit problems. A way of improving blood sugar control is eating a healthy  diet by:  Eating less sugar and carbohydrates  Increasing activity/exercise  Talking with your doctor about reaching your blood sugar  goals High blood sugars (greater than 180 mg/dL) can raise your risk of infections and slow your recovery, so you will need to focus on controlling your diabetes during the weeks before surgery. Make sure that the doctor who takes care of your diabetes knows about your planned surgery including the date and location.  How do I manage my blood sugar before surgery? Check your blood sugar at least 4 times a day, starting 2 days before surgery, to make sure that the level is not too high or low. Check your blood sugar the morning of your surgery when you wake up and every 2 hours until you get to the Short Stay unit. If your blood sugar is less than 70 mg/dL, you will need to treat for low blood sugar: Do not take insulin. Treat a low blood sugar (less than 70 mg/dL) with  cup of clear juice (cranberry or apple), 4 glucose tablets, OR glucose gel. Recheck blood sugar in 15 minutes after treatment (to make sure it is greater than 70 mg/dL). If your blood sugar is not greater than 70 mg/dL on recheck, call [phone] for further instructions. Report your blood sugar to the short stay nurse when you get to Short Stay.  If you are admitted to the hospital after surgery: Your blood sugar will be checked by the staff and you will probably be given insulin after surgery (instead of oral diabetes medicines) to make sure you have good blood sugar levels. The goal for blood sugar control after surgery is 80-180 mg/dL.   WHAT DO I DO ABOUT MY DIABETES MEDICATION?  Do not take oral diabetes medicines (pills) the morning of surgery.  THE NIGHT BEFORE SURGERY, take   0  units of       insulin.       THE MORNING OF SURGERY, take( 50% of your usual dose)   2.5-5 units of   Glargine   insulin.                                     You may not have any metal on your body  including              piercings  Do not wear jewelry, lotions, powders or  deodorant              Men may shave face and neck.   Do not bring valuables to the hospital. Edgar Brewer.  Contacts, dentures or bridgework may not be worn into surgery.     Patients discharged the day of surgery will not be allowed to drive home.  IF YOU ARE HAVING SURGERY AND GOING HOME THE SAME DAY, YOU MUST HAVE AN ADULT TO DRIVE YOU HOME AND BE WITH YOU FOR 24 HOURS. YOU MAY GO HOME BY TAXI OR UBER OR ORTHERWISE, BUT AN ADULT MUST ACCOMPANY YOU HOME AND STAY WITH YOU FOR 24 HOURS.  Name and phone number of your driver:  Special Instructions: N/A              Please read over the following fact sheets you were given: _____________________________________________________________________             [facility] - Preparing for Surgery Before surgery, you can play an important role.  Because skin is not sterile,  your skin needs to be as free of germs as possible.  You can reduce the number of germs on your skin by washing with CHG (chlorahexidine gluconate) soap before surgery.  CHG is an antiseptic cleaner which kills germs and bonds with the skin to continue killing germs even after washing. Please DO NOT use if you have an allergy to CHG or antibacterial soaps.  If your skin becomes reddened/irritated stop using the CHG and inform your nurse when you arrive at Short Stay. Do not shave (including legs and underarms) for at least 48 hours prior to the first CHG shower.   Please follow these instructions carefully:  1.  Shower with CHG Soap the night before surgery and the  morning of Surgery.  2.  If you choose to wash your hair, wash your hair first as usual with your  normal  shampoo.  3.  After you shampoo, rinse your hair and body thoroughly to remove the  shampoo.                            4.  Use CHG as you would any other liquid soap.  You can apply chg  directly  to the skin and wash                       Gently with a scrungie or clean washcloth.  5.  Apply the CHG Soap to your body ONLY FROM THE NECK DOWN.   Do not use on face/ open                           Wound or open sores. Avoid contact with eyes, ears mouth and genitals (private parts).                       Wash face,  Genitals (private parts) with your normal soap.             6.  Wash thoroughly, paying special attention to the area where your surgery  will be performed.  7.  Thoroughly rinse your body with warm water from the neck down.  8.  DO NOT shower/wash with your normal soap after using and rinsing off  the CHG Soap.             9.  Pat yourself dry with a clean towel.            10.  Wear clean pajamas.            11.  Place clean sheets on your bed the night of your first shower and do not  sleep with pets. Day of Surgery : Do not apply any lotions/deodorants the morning of surgery.  Please wear clean clothes to the hospital/surgery center.  FAILURE TO FOLLOW THESE INSTRUCTIONS MAY RESULT IN THE CANCELLATION OF YOUR SURGERY PATIENT SIGNATURE_________________________________  NURSE SIGNATURE__________________________________  ________________________________________________________________________

## 2021-02-19 ENCOUNTER — Encounter (HOSPITAL_COMMUNITY): Payer: Self-pay

## 2021-02-19 ENCOUNTER — Encounter (HOSPITAL_COMMUNITY)
Admission: RE | Admit: 2021-02-19 | Discharge: 2021-02-19 | Disposition: A | Discharge status: Home / Self Care | Payer: Medicare Other | Source: Ambulatory Visit | Attending: Urology | Admitting: Urology

## 2021-02-19 LAB — NUCLEOTIDASE, 5', BLOOD: 5-Nucleotidase: 14 U/L — ABNORMAL HIGH (ref 0–10)

## 2021-02-19 NOTE — Progress Notes (Signed)
COVID test- NA   PCP - Dr. Ansel Bong Cardiologist - Dr. Edmonia James  Chest x-ray - 01/29/21-epic EKG - 05/17/20-epic Stress Test - 08/25/20-epic ECHO - 08/27/20-epic Cardiac Cath - no Pacemaker/ICD device last checked:NA  Sleep Study - no CPAP -   Fasting Blood Sugar - Mother didn't know Checks Blood Sugar _____ times a day  Blood Thinner Instructions:ASA 81/ Dr. [name] Aspirin Instructions:continue taking/ Dr. Tresa Moore Last Dose:  Anesthesia review: yes  Patient denies shortness of breath, fever, cough and chest pain at PAT appointment. PAT appointment was a phone call.  He refused to answer questions and got his Mother to the phone. Pt became mad and verbally abusive towards his mother while on the phone because it was going to take too long. She answered the questions as best she could. He has Dialysis 3 times a week. No SOB with activities around the house. She didn't know his CBGs.  Patient verbalized understanding of instructions that were given to them at the PAT appointment. Patient was also instructed that they will need to review over the PAT instructions again at home before surgery. I read the instructions to the Mother and she read the important parts back to me.

## 2021-02-19 NOTE — Progress Notes (Signed)
Anesthesia Chart Review   Case: [id number] Date/Time: 02/25/21 1230   Procedures:      CYSTOSCOPY WITH BILATERAL  RETROGRADE PYELOGRAM, RIGHT URETEROSCOPY WITH POSSIBLE BIOPSY AND POSSIBLE STENT PLACEMENT (Bilateral)     HOLMIUM LASER OF TUMOR (Right)   Anesthesia type: Choice   Pre-op diagnosis: RIGHT URETERAL MASS, HYDRONEPHROSIS   Location: [facility] / [facility]   Surgeons: Alexis Frock, MD       DISCUSSION:61 y.o. former smoker with h/o HTN, GERD, DM II, ESRD on dialysis MWF, CHF, mild AS, right ureteral mass, hydronephrosis scheduled for above procedure 02/25/21 with Dr. Alexis Frock.   Pt last seen by cardiology 08/11/2020.   Normal stress test 08/25/20.   Echo 08/27/2020 with mild AS, EF 60-65%, stable.   Anticipate pt can proceed with planned procedure barring acute status change. Labs DOS.  VS: Ht 5\' 8"  (1.727 m)    Wt 92.5 kg    BMI 31.02 kg/m   PROVIDERS: Marin Olp, MD is PCP   Jenkins Rouge, MD is Cardiologist  LABS:  labs DOS (all labs ordered are listed, but only abnormal results are displayed)  Labs Reviewed - No data to display   IMAGES:   EKG: 03/17/20 Rate 87 bpm  Normal sinus rhythm Nonspecific T wave abnormality Abnormal ECG No significant change since last tracing  CV: Echo 08/27/2020 1. Left ventricular ejection fraction, by estimation, is 60 to 65%. The  left ventricle has normal function. The left ventricle has no regional  wall motion abnormalities. Left ventricular diastolic parameters are  consistent with Grade I diastolic  dysfunction (impaired relaxation).   2. Right ventricular systolic function is normal. The right ventricular  size is normal.   3. Left atrial size was moderately dilated.   4. The mitral valve is normal in structure. No evidence of mitral valve  regurgitation. No evidence of mitral stenosis.   5. The aortic valve is tricuspid. There is moderate calcification of the  aortic valve. There is moderate  thickening of the aortic valve. Aortic  valve regurgitation is not visualized. Mild aortic valve stenosis.   6. The inferior vena cava is normal in size with greater than 50%  respiratory variability, suggesting right atrial pressure of 3 mmHg.   Myocardial Perfusion 08/25/20 Nuclear stress EF: 54%. The left ventricular ejection fraction is mildly decreased (45-54%). This is a low risk study. There is no evidence of ischemia and no evidence of previous infarction. The study is normal. Past Medical History:  Diagnosis Date   Acute on chronic combined systolic and diastolic CHF, NYHA class 1 ([place]) 01/13/2015   Allergy    Arthritis    Chronic kidney disease    MWF [name].   Diabetes mellitus type 2 with complications ([place])    Previous insulin, non-insulin requiring noted 09/2018.  Complications include retinopathy, peripheral neuropathy.   GERD (gastroesophageal reflux disease)    at times   Head injury with loss of consciousness ([place])    History of kidney stones    Hypertension    Neuromuscular disorder ([place])    Neuropathy    Pulmonary hypertension ([place])    PA peak pressure 41 mmHg 03/04/16 echo   Stroke [facility])    no residual weakness   Varicose veins of lower extremities with complications 83/25/4982    Past Surgical History:  Procedure Laterality Date   AV FISTULA PLACEMENT Left 06/01/2016   Procedure: ARTERIOVENOUS (AV) FISTULA CREATION;  Surgeon: [name], MD;  Location:  [place] OR;  Service: Vascular;  Laterality: Left;   AV FISTULA PLACEMENT Left 08/10/2017   Procedure: BRACHIOCEPHALIC ARTERIOVENOUS (AV) FISTULA CREATION LEFT UPPER EXTREMITY;  Surgeon: [name], MD;  Location: [place];  Service: Vascular;  Laterality: Left;   CHOLECYSTECTOMY N/A 10/04/2018   Procedure: LAPAROSCOPIC CHOLECYSTECTOMY;  Surgeon: Mickeal Skinner, MD;  Location: [place];  Service: General;  Laterality: N/A;   COLONOSCOPY W/ POLYPECTOMY  03/2016   For evaluation of iron deficiency  anemia.  Dr. Silverio Decamp.  10 polyps, largest 20 mm.  Pathology: tubular adenomas with no high-grade dysplasia, hyperplastic.  None bleeding internal hemorrhoids.  Pandiverticulosis.   ENDOVENOUS ABLATION SAPHENOUS VEIN W/ LASER Left 07-23-2015   endovenous laser ablation left greater saphenous vein by [name]   ENDOVENOUS ABLATION SAPHENOUS VEIN W/ LASER Right 10/15/2015   EVLA right greater saphenous vein by [name]   ESOPHAGOGASTRODUODENOSCOPY  03/2016   For evaluation of IDA.  Dr. Silverio Decamp.  Duodenal erythema, pathology benign mucosa, no villous atrophy.   GALLBLADDER SURGERY  10/2018   IR RADIOLOGIST EVAL & MGMT  11/29/2018   TEE WITHOUT CARDIOVERSION N/A 11/19/2018   Procedure: TRANSESOPHAGEAL ECHOCARDIOGRAM (TEE);  Surgeon: Thayer Headings, MD;  [facility] END[facility];  Service: Cardiovascular;  Laterality: N/A;    MEDICATIONS:  aspirin EC 81 MG tablet   b complex-vitamin c-folic acid (NEPHRO-VITE) 0.8 MG TABS tablet   cinacalcet (SENSIPAR) 60 MG tablet   ibuprofen (ADVIL) 200 MG tablet   Insulin Glargine (BASAGLAR KWIKPEN) 100 UNIT/ML   Insulin Pen Needle 31G X 8 MM MISC   omeprazole (PRILOSEC) 20 MG capsule   ondansetron (ZOFRAN) 4 MG tablet   rosuvastatin (CRESTOR) 10 MG tablet   sevelamer carbonate (RENVELA) 800 MG tablet   No current facility-administered medications for this encounter.     Konrad Felix Ward, PA-C [facility] [phone]

## 2021-02-19 NOTE — Anesthesia Preprocedure Evaluation (Addendum)
Anesthesia Evaluation  Patient identified by MRN, date of birth, ID band Patient awake    Reviewed: Allergy & Precautions, NPO status , Patient's Chart, lab work & pertinent test results  Airway Mallampati: II  TM Distance: >3 FB Neck ROM: Full    Dental  (+) Dental Advisory Given   Pulmonary former smoker,    breath sounds clear to auscultation       Cardiovascular hypertension,  Rhythm:Regular Rate:Normal     Neuro/Psych CVA    GI/Hepatic Neg liver ROS, GERD  ,  Endo/Other  diabetes  Renal/GU Renal disease     Musculoskeletal  (+) Arthritis ,   Abdominal   Peds  Hematology  (+) anemia ,   Anesthesia Other Findings   Reproductive/Obstetrics                            Anesthesia Physical Anesthesia Plan  ASA: 3  Anesthesia Plan: General   Post-op Pain Management: Tylenol PO (pre-op)   Induction: Intravenous  PONV Risk Score and Plan: 2 and Dexamethasone and Ondansetron  Airway Management Planned: LMA  Additional Equipment: None  Intra-op Plan:   Post-operative Plan: Extubation in OR  Informed Consent: I have reviewed the patients History and Physical, chart, labs and discussed the procedure including the risks, benefits and alternatives for the proposed anesthesia with the patient or authorized representative who has indicated his/her understanding and acceptance.     Dental advisory given  Plan Discussed with: CRNA  Anesthesia Plan Comments:        Anesthesia Quick Evaluation

## 2021-02-25 ENCOUNTER — Encounter (HOSPITAL_COMMUNITY)
Admission: RE | Disposition: A | Discharge status: Home / Self Care | Payer: Self-pay | Source: Home / Self Care | Attending: Urology

## 2021-02-25 ENCOUNTER — Ambulatory Visit (HOSPITAL_COMMUNITY)
Admission: RE | Admit: 2021-02-25 | Discharge: 2021-02-25 | Disposition: A | Discharge status: Home / Self Care | Payer: Medicare Other | Attending: Urology | Admitting: Urology

## 2021-02-25 ENCOUNTER — Ambulatory Visit (HOSPITAL_COMMUNITY): Payer: Medicare Other | Admitting: Physician Assistant

## 2021-02-25 ENCOUNTER — Ambulatory Visit (HOSPITAL_COMMUNITY): Payer: Medicare Other | Admitting: Anesthesiology

## 2021-02-25 ENCOUNTER — Encounter (HOSPITAL_COMMUNITY): Payer: Self-pay | Admitting: Urology

## 2021-02-25 ENCOUNTER — Ambulatory Visit (HOSPITAL_COMMUNITY): Payer: Medicare Other

## 2021-02-25 DIAGNOSIS — E669 Obesity, unspecified: Secondary | ICD-10-CM | POA: Insufficient documentation

## 2021-02-25 DIAGNOSIS — N1330 Unspecified hydronephrosis: Secondary | ICD-10-CM | POA: Insufficient documentation

## 2021-02-25 DIAGNOSIS — Z87891 Personal history of nicotine dependence: Secondary | ICD-10-CM | POA: Diagnosis not present

## 2021-02-25 DIAGNOSIS — E119 Type 2 diabetes mellitus without complications: Secondary | ICD-10-CM | POA: Diagnosis not present

## 2021-02-25 DIAGNOSIS — Z8673 Personal history of transient ischemic attack (TIA), and cerebral infarction without residual deficits: Secondary | ICD-10-CM | POA: Diagnosis not present

## 2021-02-25 DIAGNOSIS — I132 Hypertensive heart and chronic kidney disease with heart failure and with stage 5 chronic kidney disease, or end stage renal disease: Secondary | ICD-10-CM | POA: Insufficient documentation

## 2021-02-25 DIAGNOSIS — Z992 Dependence on renal dialysis: Secondary | ICD-10-CM | POA: Insufficient documentation

## 2021-02-25 DIAGNOSIS — I5042 Chronic combined systolic (congestive) and diastolic (congestive) heart failure: Secondary | ICD-10-CM | POA: Diagnosis not present

## 2021-02-25 DIAGNOSIS — N186 End stage renal disease: Secondary | ICD-10-CM | POA: Diagnosis not present

## 2021-02-25 HISTORY — PX: CYSTOSCOPY WITH RETROGRADE PYELOGRAM, URETEROSCOPY AND STENT PLACEMENT: SHX5789

## 2021-02-25 HISTORY — PX: HOLMIUM LASER APPLICATION: SHX5852

## 2021-02-25 LAB — BASIC METABOLIC PANEL
Anion gap: 10 (ref 5–15)
BUN: 34 mg/dL — ABNORMAL HIGH (ref 8–23)
CO2: 29 mmol/L (ref 22–32)
Calcium: 8.6 mg/dL — ABNORMAL LOW (ref 8.9–10.3)
Chloride: 94 mmol/L — ABNORMAL LOW (ref 98–111)
Creatinine, Ser: 6.78 mg/dL — ABNORMAL HIGH (ref 0.61–1.24)
GFR, Estimated: 9 mL/min — ABNORMAL LOW (ref >60)
Glucose, Bld: 268 mg/dL — ABNORMAL HIGH (ref 70–99)
Potassium: 4.6 mmol/L (ref 3.5–5.1)
Sodium: 133 mmol/L — ABNORMAL LOW (ref 135–145)

## 2021-02-25 LAB — CBC WITH DIFFERENTIAL/PLATELET
Abs Immature Granulocytes: 0.03 10*3/uL (ref 0.00–0.07)
Basophils Absolute: 0.1 10*3/uL (ref 0.0–0.1)
Basophils Relative: 1 %
Eosinophils Absolute: 0.2 10*3/uL (ref 0.0–0.5)
Eosinophils Relative: 3 %
HCT: 38 % — ABNORMAL LOW (ref 39.0–52.0)
Hemoglobin: 12.6 g/dL — ABNORMAL LOW (ref 13.0–17.0)
Immature Granulocytes: 1 %
Lymphocytes Relative: 16 %
Lymphs Abs: 1 10*3/uL (ref 0.7–4.0)
MCH: 29.9 pg (ref 26.0–34.0)
MCHC: 33.2 g/dL (ref 30.0–36.0)
MCV: 90.3 fL (ref 80.0–100.0)
Monocytes Absolute: 0.5 10*3/uL (ref 0.1–1.0)
Monocytes Relative: 7 %
Neutro Abs: 4.7 10*3/uL (ref 1.7–7.7)
Neutrophils Relative %: 72 %
Platelets: 179 10*3/uL (ref 150–400)
RBC: 4.21 MIL/uL — ABNORMAL LOW (ref 4.22–5.81)
RDW: 13.5 % (ref 11.5–15.5)
WBC: 6.5 10*3/uL (ref 4.0–10.5)
nRBC: 0 % (ref 0.0–0.2)

## 2021-02-25 LAB — GLUCOSE, CAPILLARY
Glucose-Capillary: 291 mg/dL — ABNORMAL HIGH (ref 70–99)
Glucose-Capillary: 183 mg/dL — ABNORMAL HIGH (ref 70–99)

## 2021-02-25 SURGERY — CYSTOURETEROSCOPY, WITH RETROGRADE PYELOGRAM AND STENT INSERTION
Anesthesia: Choice | Laterality: Right

## 2021-02-25 MED ORDER — LIDOCAINE HCL (PF) 2 % IJ SOLN
INTRAMUSCULAR | Status: AC
  Filled 2021-02-25: qty 5

## 2021-02-25 MED ORDER — PROPOFOL 10 MG/ML IV BOLUS
INTRAVENOUS | Status: AC
  Filled 2021-02-25: qty 20

## 2021-02-25 MED ORDER — MIDAZOLAM HCL 2 MG/2ML IJ SOLN
INTRAMUSCULAR | Status: AC
  Filled 2021-02-25: qty 2

## 2021-02-25 MED ORDER — PROPOFOL 10 MG/ML IV BOLUS
INTRAVENOUS | Status: DC | PRN
  Administered 2021-02-25: 150 mg via INTRAVENOUS

## 2021-02-25 MED ORDER — CHLORHEXIDINE GLUCONATE 0.12 % MT SOLN
15.0000 mL | Freq: Once | OROMUCOSAL | Status: AC
  Administered 2021-02-25: 15 mL via OROMUCOSAL

## 2021-02-25 MED ORDER — SODIUM CHLORIDE 0.9 % IV SOLN
1.0000 g | INTRAVENOUS | Status: AC
  Administered 2021-02-25: 2 g via INTRAVENOUS
  Filled 2021-02-25: qty 10

## 2021-02-25 MED ORDER — PHENYLEPHRINE 40 MCG/ML (10ML) SYRINGE FOR IV PUSH (FOR BLOOD PRESSURE SUPPORT)
PREFILLED_SYRINGE | INTRAVENOUS | Status: DC | PRN
  Administered 2021-02-25: 120 ug via INTRAVENOUS
  Administered 2021-02-25 (×2): 80 ug via INTRAVENOUS

## 2021-02-25 MED ORDER — INSULIN ASPART 100 UNIT/ML IJ SOLN
INTRAMUSCULAR | Status: AC
  Filled 2021-02-25: qty 1

## 2021-02-25 MED ORDER — INSULIN ASPART 100 UNIT/ML IJ SOLN
8.0000 [IU] | INTRAMUSCULAR | Status: AC
  Administered 2021-02-25: 8 [IU] via SUBCUTANEOUS

## 2021-02-25 MED ORDER — PHENYLEPHRINE 40 MCG/ML (10ML) SYRINGE FOR IV PUSH (FOR BLOOD PRESSURE SUPPORT)
PREFILLED_SYRINGE | INTRAVENOUS | Status: AC
  Filled 2021-02-25: qty 10

## 2021-02-25 MED ORDER — SODIUM CHLORIDE 0.9 % IV SOLN: INTRAVENOUS | Status: DC | PRN

## 2021-02-25 MED ORDER — ONDANSETRON HCL 4 MG/2ML IJ SOLN
INTRAMUSCULAR | Status: AC
  Filled 2021-02-25: qty 2

## 2021-02-25 MED ORDER — SODIUM CHLORIDE (PF) 0.9 % IJ SOLN
INTRAMUSCULAR | Status: AC
  Filled 2021-02-25: qty 10

## 2021-02-25 MED ORDER — IOHEXOL 300 MG/ML  SOLN
INTRAMUSCULAR | Status: DC | PRN
  Administered 2021-02-25: 19 mL via URETHRAL

## 2021-02-25 MED ORDER — ONDANSETRON HCL 4 MG/2ML IJ SOLN
INTRAMUSCULAR | Status: DC | PRN
  Administered 2021-02-25: 4 mg via INTRAVENOUS

## 2021-02-25 MED ORDER — ORAL CARE MOUTH RINSE: 15.0000 mL | Freq: Once | OROMUCOSAL | Status: AC

## 2021-02-25 MED ORDER — TRAMADOL HCL 50 MG PO TABS
50.0000 mg | ORAL_TABLET | Freq: Four times a day (QID) | ORAL | 0 refills | Status: DC | PRN
Start: 2021-02-25 — End: 2021-03-30

## 2021-02-25 MED ORDER — LACTATED RINGERS IV SOLN: INTRAVENOUS | Status: DC

## 2021-02-25 MED ORDER — FENTANYL CITRATE (PF) 100 MCG/2ML IJ SOLN
INTRAMUSCULAR | Status: DC | PRN
  Administered 2021-02-25: 25 ug via INTRAVENOUS

## 2021-02-25 MED ORDER — LIDOCAINE 2% (20 MG/ML) 5 ML SYRINGE
INTRAMUSCULAR | Status: DC | PRN
  Administered 2021-02-25: 60 mg via INTRAVENOUS

## 2021-02-25 MED ORDER — DEXAMETHASONE SODIUM PHOSPHATE 10 MG/ML IJ SOLN
INTRAMUSCULAR | Status: AC
  Filled 2021-02-25: qty 1

## 2021-02-25 MED ORDER — SODIUM CHLORIDE 0.9 % IR SOLN
Status: DC | PRN
  Administered 2021-02-25: 3000 mL via INTRAVESICAL

## 2021-02-25 MED ORDER — FENTANYL CITRATE (PF) 100 MCG/2ML IJ SOLN
INTRAMUSCULAR | Status: AC
  Filled 2021-02-25: qty 2

## 2021-02-25 SURGICAL SUPPLY — 23 items
BAG URO CATCHER STRL LF (MISCELLANEOUS) ×3 IMPLANT
BASKET LASER NITINOL 1.9FR (BASKET) IMPLANT
BSKT STON RTRVL 120 1.9FR (BASKET)
CATH URETL OPEN END 6FR 70 (CATHETERS) ×3 IMPLANT
CLOTH BEACON ORANGE TIMEOUT ST (SAFETY) ×3 IMPLANT
EXTRACTOR STONE 1.7FRX115CM (UROLOGICAL SUPPLIES) IMPLANT
GLOVE SURG ENC TEXT LTX SZ7.5 (GLOVE) ×5 IMPLANT
GOWN STRL REUS W/TWL LRG LVL3 (GOWN DISPOSABLE) ×4 IMPLANT
GUIDEWIRE ANG ZIPWIRE 038X150 (WIRE) ×3 IMPLANT
GUIDEWIRE STR DUAL SENSOR (WIRE) ×3 IMPLANT
KIT TURNOVER KIT A (KITS) ×1 IMPLANT
LASER FIB FLEXIVA PULSE ID 365 (Laser) IMPLANT
LASER FIB FLEXIVA PULSE ID 550 (Laser) IMPLANT
LASER FIB FLEXIVA PULSE ID 910 (Laser) IMPLANT
MANIFOLD NEPTUNE II (INSTRUMENTS) ×3 IMPLANT
PACK CYSTO (CUSTOM PROCEDURE TRAY) ×3 IMPLANT
SHEATH NAVIGATOR HD 11/13X28 (SHEATH) IMPLANT
SHEATH NAVIGATOR HD 11/13X36 (SHEATH) IMPLANT
TRACTIP FLEXIVA PULS ID 200XHI (Laser) IMPLANT
TRACTIP FLEXIVA PULSE ID 200 (Laser)
TUBE FEEDING 8FR 16IN STR KANG (MISCELLANEOUS) ×3 IMPLANT
TUBING CONNECTING 10 (TUBING) ×3 IMPLANT
TUBING UROLOGY SET (TUBING) ×3 IMPLANT

## 2021-02-25 NOTE — H&P (Signed)
Edgar Brewer is an 61 y.o. male.    Chief Complaint: Pre-Op Cysto, Bilateral Retrogrades, RIGHT Ureteroscopy  HPI:   1 - Rt Hydronephrosis, Rule Out Ureteral Mass - new moderate rt hydro to what appears like intraluminal soft tissue density on CT 01/2021. New since CT 2021. No pelvic adnopathy. Remote smoker.   2 - End Stage Renal Disease - on hemodialysis MWF via left arm AVF since aroung 2019 for medical renal disease. Between nerphologists at present. Voids small amoutn about once daily.   PMH sig for DM2, CVA (no deficits / blood thinners), large truncal obesity. He is disabled. His PCP is Edgar Reddish MD.   Today "Edgar Brewer" is seen to proceed with cysto, retrogrades, right ureteroscopy to r/o ureteral neplasm. NO iterval fevers. REcieved dialysis yesterday.   Past Medical History:  Diagnosis Date   Acute on chronic combined systolic and diastolic CHF, NYHA class 1 ([place]) 01/13/2015   Allergy    Arthritis    Chronic kidney disease    MWF [name].   Diabetes mellitus type 2 with complications ([place])    Previous insulin, non-insulin requiring noted 09/2018.  Complications include retinopathy, peripheral neuropathy.   GERD (gastroesophageal reflux disease)    at times   Head injury with loss of consciousness ([place])    History of kidney stones    Hypertension    Neuromuscular disorder ([place])    Neuropathy    Pulmonary hypertension ([place])    PA peak pressure 41 mmHg 03/04/16 echo   Stroke [facility])    no residual weakness   Varicose veins of lower extremities with complications 70/17/7939    Past Surgical History:  Procedure Laterality Date   AV FISTULA PLACEMENT Left 06/01/2016   Procedure: ARTERIOVENOUS (AV) FISTULA CREATION;  Surgeon: [name], MD;  Location: [place] [facility];  Service: Vascular;  Laterality: Left;   AV FISTULA PLACEMENT Left 08/10/2017   Procedure: BRACHIOCEPHALIC ARTERIOVENOUS (AV) FISTULA CREATION LEFT UPPER EXTREMITY;  Surgeon: [name], MD;  Location: [place];  Service: Vascular;  Laterality: Left;   CHOLECYSTECTOMY N/A 10/04/2018   Procedure: LAPAROSCOPIC CHOLECYSTECTOMY;  Surgeon: Mickeal Skinner, MD;  Location: [place];  Service: General;  Laterality: N/A;   COLONOSCOPY W/ POLYPECTOMY  03/2016   For evaluation of iron deficiency anemia.  Dr. Silverio Decamp.  10 polyps, largest 20 mm.  Pathology: tubular adenomas with no high-grade dysplasia, hyperplastic.  None bleeding internal hemorrhoids.  Pandiverticulosis.   ENDOVENOUS ABLATION SAPHENOUS VEIN W/ LASER Left 07-23-2015   endovenous laser ablation left greater saphenous vein by [name]   ENDOVENOUS ABLATION SAPHENOUS VEIN W/ LASER Right 10/15/2015   EVLA right greater saphenous vein by [name]   ESOPHAGOGASTRODUODENOSCOPY  03/2016   For evaluation of IDA.  Dr. Silverio Decamp.  Duodenal erythema, pathology benign mucosa, no villous atrophy.   GALLBLADDER SURGERY  10/2018   IR RADIOLOGIST EVAL & MGMT  11/29/2018   TEE WITHOUT CARDIOVERSION N/A 11/19/2018   Procedure: TRANSESOPHAGEAL ECHOCARDIOGRAM (TEE);  Surgeon: Acie Fredrickson Wonda Cheng, MD;  Location: [facility] ENDOSCOPY;  Service: Cardiovascular;  Laterality: N/A;    Family History  Problem Relation Age of Onset   Arthritis Mother    Hearing loss Father    Prostate cancer Father    Hyperlipidemia Maternal Grandmother    Heart disease Maternal Grandmother    Lung cancer Maternal Grandfather    Social History:  reports that he quit smoking about 6 years ago. His smoking use included cigarettes. He has a  45.00 pack-year smoking history. He has never used smokeless tobacco. He reports current alcohol use of about 1.0 standard drink per week. He reports that he does not use drugs.  Allergies:  Allergies  Allergen Reactions   Codeine Anaphylaxis, Hives, Swelling and Other (See Comments)    Swelling all over body and the throat   Clindamycin/Lincomycin Nausea And Vomiting    No medications prior to admission.    No results found for this or  any previous visit (from the past 48 hour(s)). No results found.  Review of Systems  Constitutional:  Negative for chills and fever.  All other systems reviewed and are negative.  There were no vitals taken for this visit. Physical Exam Vitals reviewed.  HENT:     Head: Normocephalic.     Mouth/Throat:     Mouth: Mucous membranes are moist.  Eyes:     Pupils: Pupils are equal, round, and reactive to light.  Cardiovascular:     Rate and Rhythm: Normal rate.  Pulmonary:     Effort: Pulmonary effort is normal.  Abdominal:     Comments: Stable large truncal obesity.   Genitourinary:    Comments: NO CVAT at present.  Musculoskeletal:        General: Normal range of motion.  Skin:    General: Skin is warm.     Assessment/Plan  Proceed as planned with endoscopic GU eval. Risks, benefits, alternatives, expected peri-op course discussed previously and reiterated today. His baseline ESRD and significnat metabolic dysfunciton increses risk of ALL peri-op complications including mortality.   Alexis Frock, MD 02/25/2021, 8:03 AM

## 2021-02-25 NOTE — Brief Op Note (Signed)
02/25/2021  1:25 PM  PATIENT:  Edgar Brewer  61 y.o. male  PRE-OPERATIVE DIAGNOSIS:  RIGHT URETERAL MASS, HYDRONEPHROSIS  POST-OPERATIVE DIAGNOSIS:  right ureteral mass, hydronephrosis  PROCEDURE:  Cysto, Bilateral retrogrades, Right diagnostic ureteroscopy, Bladder biopsy and fulgeration  SURGEON:  Surgeon(s) and Role:    Alexis Frock, MD - Primary  PHYSICIAN ASSISTANT:   ASSISTANTS: none   ANESTHESIA:   general  EBL:  minmal   BLOOD ADMINISTERED:none  DRAINS: none   LOCAL MEDICATIONS USED:  NONE  SPECIMEN:  Source of Specimen:  bladder erythema  DISPOSITION OF SPECIMEN:  PATHOLOGY  COUNTS:  YES  TOURNIQUET:  * No tourniquets in log *  DICTATION: .Other Dictation: Dictation Number [id number]  PLAN OF CARE: Discharge to home after PACU  PATIENT DISPOSITION:  PACU - hemodynamically stable.   Delay start of Pharmacological VTE agent (>24hrs) due to surgical blood loss or risk of bleeding: yes

## 2021-02-25 NOTE — Discharge Instructions (Addendum)
1 - You may have urinary urgency (bladder spasms) and bloody urine on / off for up to 2 weeks.  This is normal.  2 - Call MD or go to ER for fever >102, severe pain / nausea / vomiting not relieved by medications, or acute change in medical status  

## 2021-02-25 NOTE — Anesthesia Procedure Notes (Signed)
Procedure Name: LMA Insertion Date/Time: 02/25/2021 12:51 PM Performed by: Sharlette Dense, CRNA Patient Re-evaluated:Patient Re-evaluated prior to induction Oxygen Delivery Method: Circle system utilized Preoxygenation: Pre-oxygenation with 100% oxygen Induction Type: IV induction LMA: LMA inserted LMA Size: 4.0 Number of attempts: 1 Placement Confirmation: positive ETCO2 and breath sounds checked- equal and bilateral Tube secured with: Tape Dental Injury: Teeth and Oropharynx as per pre-operative assessment

## 2021-02-25 NOTE — Transfer of Care (Signed)
Immediate Anesthesia Transfer of Care Note  Patient: Edgar Brewer  Procedure(s) Performed: CYSTOSCOPY WITH BILATERAL  RETROGRADE PYELOGRAM, RIGHT URETEROSCOPY WITH POSSIBLE BIOPSY AND POSSIBLE STENT PLACEMENT (Bilateral) HOLMIUM LASER OF TUMOR (Right)  Patient Location: PACU  Anesthesia Type:General  Level of Consciousness: awake  Airway & Oxygen Therapy: Patient Spontanous Breathing and Patient connected to face mask oxygen  Post-op Assessment: Report given to RN and Post -op Vital signs reviewed and stable  Post vital signs: Reviewed and stable  Last Vitals:  Vitals Value Taken Time  BP 120/58 02/25/21 1338  Temp    Pulse 77 02/25/21 1339  Resp 15 02/25/21 1339  SpO2 100 % 02/25/21 1339  Vitals shown include unvalidated device data.  Last Pain:  Vitals:   02/25/21 1137  TempSrc:   PainSc: 0-No pain         Complications: No notable events documented.

## 2021-02-26 ENCOUNTER — Encounter (HOSPITAL_COMMUNITY): Payer: Self-pay | Admitting: Urology

## 2021-02-26 LAB — SURGICAL PATHOLOGY

## 2021-02-26 NOTE — Op Note (Signed)
NAMESHARAD, VANEATON MEDICAL RECORD NO: [id number] ACCOUNT NO: [id number] DATE OF BIRTH: 1960/04/07 FACILITY: [facility] LOCATION: [facility] PHYSICIAN: Alexis Frock, MD  Operative Report   DATE OF PROCEDURE: 02/25/2021  PREOPERATIVE DIAGNOSES: 1.  Rule out right ureteral mass. 2.  History of hematuria.  PROCEDURE PERFORMED:  1.  Cystoscopy, bilateral retrograde pyelograms. 2.  Right diagnostic ureteroscopy. 3.  Bladder biopsy, fulguration.  ESTIMATED BLOOD LOSS:  Nil.  COMPLICATIONS:  None.  SPECIMENS:  Bladder erythema for permanent pathology.  FINDINGS:  1.  Unremarkable bilateral pyelograms. 2.  Unremarkable right diagnostic ureteroscopy. 3.  Small bladder erythema.  This was biopsied.  INDICATIONS:  The patient is a 61 year old man with end-stage renal disease, on dialysis, who was found on workup of flank pain, hematuria to have a questionable right ureteral mass.  Options were discussed including recommended path of diagnostic  ureteroscopy, possible biopsy and he wished to proceed.  He presents for this today.  He had dialysis yesterday and plans for tomorrow.  He is normal volemic at presentation.  Informed consent was obtained and placed in medical record.  DESCRIPTION OF PROCEDURE:  The patient identified being himself, procedure being cystoscopy, bilateral retrogrades, right ureteroscopy was confirmed. Procedure timeout was performed.  Intravenous antibiotics were administered and general anesthesia  induced.  The patient was placed into a low lithotomy position. A sterile field was created.  Prepped and draped the base of the penis, perineum and proximal thigh using iodine. Cystourethroscopy was performed using a 21-French rigid cystoscope with  offset lens. Inspection of the anterior and posterior urethra was unremarkable. Inspection of the bladder revealed no diverticula, calcifications or papillary lesions.  There was some mild erythema in the right lateral area.  This was  nonpapillary.  The  left ureteral orifice was cannulated with a 6-French renal catheter and a left retrograde pyelogram seen.  Left retrograde pyelogram demonstrated a single left ureter, single system left kidney.  No filling defects or narrowing noted.  Similarly, a right retrograde pyelogram was obtained.  Right retrograde pyelogram demonstrated a single right ureter, single system right kidney, also no filling defects or narrowing noted.  It was quite favorable.  A ZIPwire was advanced to the level of lower pole, set aside as a safety wire.  An 8-French  feeding tube placed in the urinary bladder for pressure release. Semi-rigid ureteroscopy was performed of distal two-thirds of right ureter alongside a separate sensor working wire.  No mucosal abnormalities were found.  Semirigid scope was then  exchanged over the sensor working wire for the flexible digital ureteroscope, which was placed over this using fluoroscopic guidance at the level of the kidney and flexible digital ureteroscopy was performed of the right kidney including all calices x3.   There was no evidence of intraluminal mass whatsoever.  The scope was carefully removed under direct vision and again the entire length of the right ureter was inspected and there was no evidence of intraluminal neoplasia whatsoever.  This was quite  favorable. Cystoscope was then once again used and the small area of bladder erythema was biopsied using cold cup biopsy forceps and the base fulgurated.  This was a very superficial mucosal biopsy only purposely. There was no evidence of perforation.   The bladder was emptied per cystoscope.  Procedure terminated.  The patient tolerated the procedure well.  No immediate perioperative complications.  The patient was taken to postanesthesia care unit in stable condition.  PLAN:  For discharge home.  PAA D: 02/25/2021 1:30:21 pm T: 02/25/2021 10:48:00 pm  JOB: [id number]/ [id number]

## 2021-02-26 NOTE — Anesthesia Postprocedure Evaluation (Signed)
Anesthesia Post Note  Patient: Edgar Brewer  Procedure(s) Performed: CYSTOSCOPY WITH BILATERAL  RETROGRADE PYELOGRAM, RIGHT DIAGNOSTIC URETEROSCOPY,  BLADDER BIOPSY WITH FULGERATION (Bilateral) HOLMIUM LASER OF TUMOR (Right)     Patient location during evaluation: PACU Anesthesia Type: General Level of consciousness: awake and alert Pain management: pain level controlled Vital Signs Assessment: post-procedure vital signs reviewed and stable Respiratory status: spontaneous breathing, nonlabored ventilation, respiratory function stable and patient connected to nasal cannula oxygen Cardiovascular status: blood pressure returned to baseline and stable Postop Assessment: no apparent nausea or vomiting Anesthetic complications: no   No notable events documented.  Last Vitals:  Vitals:   02/25/21 1400 02/25/21 1430  BP: 112/65 (!) 109/57  Pulse: 81 78  Resp: 14 15  Temp: 36.9 C 36.9 C  SpO2: 97% 96%    Last Pain:  Vitals:   02/25/21 1430  TempSrc:   PainSc: 0-No pain                 Tiajuana Amass

## 2021-03-05 ENCOUNTER — Other Ambulatory Visit: Payer: Self-pay

## 2021-03-05 ENCOUNTER — Telehealth: Payer: Self-pay | Admitting: Family Medicine

## 2021-03-05 MED ORDER — GABAPENTIN 300 MG PO CAPS: 300.0000 mg | ORAL_CAPSULE | Freq: Every day | ORAL | 3 refills | Status: DC

## 2021-03-05 NOTE — Telephone Encounter (Signed)
Pt's mother called in requesting a refill of a medication that has been discontinued by Dr Yong Channel. She said he has continued to take this medication and wants to continue. Please advise.  Discontinued by: Marin Olp, MD on 06/04/2020 10:36  MEDICATION:gabapentin (NEURONTIN) 300 MG capsule [[id number]]

## 2021-03-05 NOTE — Telephone Encounter (Signed)
Ok to refill 

## 2021-03-05 NOTE — Telephone Encounter (Signed)
Rx refilled.

## 2021-03-05 NOTE — Telephone Encounter (Signed)
From prior note "In the past-gabapentin 300mg  BID for neuropathy-recommended 300 mg max per day which he did not tolerate and we ultimately stopped "  If he would like to restart gabapentin 300 mg qhs prn im ok with that- #90 with 3 refills

## 2021-03-24 NOTE — Progress Notes (Signed)
Phone [phone] In person visit   Subjective:   Edgar Brewer is a 61 y.o. year old very pleasant male patient who presents for/with See problem oriented charting Chief Complaint  Patient presents with   Follow-up   This visit occurred during the SARS-CoV-2 public health emergency.  Safety protocols were in place, including screening questions prior to the visit, additional usage of staff PPE, and extensive cleaning of exam room while observing appropriate contact time as indicated for disinfecting solutions.   Past Medical History-  Patient Active Problem List   Diagnosis Date Noted   History of stroke 02/25/2020    Priority: High   ESRD (end stage renal disease) on dialysis ([place]) 10/03/2018    Priority: High   Diabetes mellitus type II, non insulin dependent ([place]) 01/14/2015    Priority: High   Chronic diastolic CHF (congestive heart failure) ([place]) 01/13/2015    Priority: High   GERD (gastroesophageal reflux disease) 03/25/2020    Priority: Medium    Aortic stenosis 02/25/2020    Priority: Medium    Type 2 diabetes mellitus with diabetic neuropathy, unspecified ([place]) 02/25/2020    Priority: Medium    Gallbladder abscess 11/15/2018    Priority: Medium    Elevated LFTs 10/18/2018    Priority: Medium    Other disorders of phosphorus metabolism 01/19/2018    Priority: Medium    Secondary hyperparathyroidism of renal origin ([place]) 09/27/2017    Priority: Medium    Hypertension associated with diabetes ([place]) 01/14/2015    Priority: Medium    Anemia of chronic kidney failure 01/14/2015    Priority: Medium    Varicose veins of lower extremities with complications 44/31/5400    Priority: Medium    CKD (chronic kidney disease), stage IV ([place]) 01/14/2015    Priority: Low   Obesity 01/14/2015    Priority: Low   Chronic venous insufficiency 10/22/2014    Priority: Low   Wound of left leg 05/08/2020   Left leg cellulitis 05/08/2020    Medications- reviewed and  updated Current Outpatient Medications  Medication Sig Dispense Refill   aspirin EC 81 MG tablet Take 81 mg by mouth daily. Swallow whole.     b complex-vitamin c-folic acid (NEPHRO-VITE) 0.8 MG TABS tablet Take 1 tablet by mouth in the morning.     cinacalcet (SENSIPAR) 60 MG tablet Take 60 mg by mouth every Tuesday, Thursday, and Saturday at 6 PM. In the evening.     gabapentin (NEURONTIN) 300 MG capsule Take 1 capsule (300 mg total) by mouth at bedtime. 90 capsule 3   ibuprofen (ADVIL) 200 MG tablet Take 400 mg by mouth every 8 (eight) hours as needed (pain.).     Insulin Pen Needle 31G X 8 MM MISC Use to inject insulin into the skin daily. Dx: E11.9 100 each 3   omeprazole (PRILOSEC) 20 MG capsule Take 1 capsule (20 mg total) by mouth daily. (Patient taking differently: Take 20 mg by mouth daily with supper.) 90 capsule 3   rosuvastatin (CRESTOR) 10 MG tablet Take 1 tablet (10 mg total) by mouth daily. (Patient taking differently: Take 10 mg by mouth every evening.) 90 tablet 3   sevelamer carbonate (RENVELA) 800 MG tablet Take 2 tablets (1,600 mg total) by mouth 3 (three) times daily with meals.     Insulin Glargine (BASAGLAR KWIKPEN) 100 UNIT/ML Inject 10-25 Units into the skin daily. Please provide pen needles 15 mL 5   No current facility-administered medications for this visit.  Objective:  BP 108/60    Pulse 80    Temp 98.5 F (36.9 C)    Ht 5' 8"  (1.727 m)    Wt 203 lb 12.8 oz (92.4 kg)    SpO2 95%    BMI 30.99 kg/m  Gen: NAD, resting comfortably CV: RRR no murmurs rubs or gallops Lungs: CTAB no crackles, wheeze, rhonchi Ext: no edema Skin: warm, dry     Assessment and Plan   # cancer screenings- declined colonoscopy  in past and again today despite precancerous polyp. He is aware of risk of colon cancer but wants to hold off on repeat colonoscopy at this time- in 2018 was advised 1 year repeat - PSA high normal last check- recheck with labs with dialysis  # ED F/U for  abdominal pain S:Pt was seen in ED on 01/29/2021 for an evaluation of abdominal pain which gradually worsened for a week. Ultimately there was concern for new hydronephrosis on CT and later had hydronephrosis. Apparently they reduced frequency of phosphate binders and he has felt better though levels have raised some -had other workup per ED note A/P: symptoms improved with changing phosphate binder dosing- will follow up with nephrology.    #End-stage renal disease on hemodialysis since January 2019. Thought related to diabetes #Secondary hyperparathyroidism S: Patient on dialysis followed by Dr. Lorrene Reid (now retired and he states getting established with new doctor) on MWF at [facility] site  -AV fistula 06/01/2016 by Dr. Donnetta Hutching A/P: overall stable x 2 - continue close follow up with  nephrology  #Heart failure with  ejection fraction-follows with Dr. [name] S: Medication: lasix in past, now managed by dialysis Edema: no increase Weight gain:overall stable Shortness of breath: none A/P: appears stable with dialysis- continue current meds   #hypertension S: medication: none since dialysis BP Readings from Last 3 Encounters:  03/30/21 108/60  02/25/21 (!) 109/57  02/16/21 114/60  A/P: controlled without medicine- continue to monitor on dialysis  #Aortic stenosis S:Last echocardiogram august 2022-reported as very mild considering fistula A/P: mild noted on last exam- repeat in 1 year   # Diabetes with neuropathy S: Medication: taking basaglar 8 units but #s still up to 300s on a regular basis but sometimes in 250s -not on januvia or tradjenta -gabapentin 383m at bedtime- not perfectly controlled Lab Results  Component Value Date   HGBA1C 8.9 (A) 12/10/2020   HGBA1C 8.1 (A) 09/08/2020   HGBA1C 7.2 (H) 05/18/2020   A/P:  check poc a1c today- #s are far too high though we will increase basaglar to 10 units and I want him to update me in 1 week with how blood sugars are doing as I will likely  increase more. Follow up 14 weeks  Neuropathy not perfectly controlled but on max dose for dialysis- continue current meds  #history of stroke- now on aspirin 863m stutter lingers from this #hyperlipidemia S: Medication: rosuvastatin 10 mg daily Lab Results  Component Value Date   CHOL 212 (H) 02/25/2020   HDL 38.40 (L) 02/25/2020   LDLDIRECT 49.0 12/10/2020   TRIG (H) 02/25/2020    433.0 Triglyceride is over 400; calculations on Lipids are invalid.   CHOLHDL 6 02/25/2020   A/P: cholesterol has been high in past - continue current meds and see if dialysis can update lipid panel  #Varicose veins/venous insufficiency S:Cardiology had considered referral back to Dr. EaDonnetta Hutchingf [facility]- has had procedures in past A/P: not bothering him lately- wants to monitor.    #  Elevated LFTs S: Patient with laparoscopic cholecystectomy September 1753 complicated by gallbladder fossa abscess status post percutaneous aspiration and JP drain in October 2020. Ultimately developed sepsis with bacteremia. LFTs were slowly improved per notes on February 07, 2019 visit with Dr. Dyann Kief was for repeat LFTs in 3 months, avoid hepatotoxins, NSAIDs, alcohol, OTC meds -They also are planning for repeat colonoscopy next visit Lab Results  Component Value Date   ALT 35 01/29/2021   AST 30 01/29/2021   GGT 668 (H) 10/20/2018   ALKPHOS 356 (H) 01/29/2021   BILITOT 0.6 01/29/2021  A/P: GGT actually much lower on last check. Alk phos slightly high but not fasting- monitor for now unless worsens- can be high in esnrd  # former smoker- get AAA scan at 40 . Offered lung cancer screening program - declines  Recommended follow up: Return in about 14 weeks (around 07/06/2021) for follow up- or sooner if needed.  Lab/Order associations:   ICD-10-CM   1. Hyperlipidemia associated with type 2 diabetes mellitus ([place])  E11.69    E78.5     2. Chronic diastolic CHF (congestive heart failure) ([facility])  I50.32      3. Type 2 diabetes mellitus with diabetic neuropathy, without long-term current use of insulin ([facility])  E11.40 Lipid panel    4. Elevated PSA  R97.20 PSA    5. ESRD (end stage renal disease) on dialysis ([facility]) Chronic N18.6    Z99.2     6. Secondary hyperparathyroidism of renal origin ([place]) Chronic N25.81     7. Elevated LFTs  R79.89     8. Essential hypertension  I10     9. Aortic valve stenosis, etiology of cardiac valve disease unspecified  I35.0       Meds ordered this encounter  Medications   Insulin Glargine (BASAGLAR KWIKPEN) 100 UNIT/ML    Sig: Inject 10-25 Units into the skin daily. Please provide pen needles    Dispense:  15 mL    Refill:  5    I,Jada Bradford,acting as a scribe for Garret Reddish, MD.,have documented all relevant documentation on the behalf of Garret Reddish, MD,as directed by  Garret Reddish, MD while in the presence of Garret Reddish, MD.  I, Garret Reddish, MD, have reviewed all documentation for this visit. The documentation on 03/30/21 for the exam, diagnosis, procedures, and orders are all accurate and complete.  Return precautions advised.  Garret Reddish, MD

## 2021-03-30 ENCOUNTER — Ambulatory Visit (INDEPENDENT_AMBULATORY_CARE_PROVIDER_SITE_OTHER): Payer: Medicare Other | Admitting: Family Medicine

## 2021-03-30 ENCOUNTER — Other Ambulatory Visit: Payer: Self-pay

## 2021-03-30 ENCOUNTER — Encounter: Payer: Self-pay | Admitting: Family Medicine

## 2021-03-30 VITALS — BP 108/60 | HR 80 | Temp 98.5°F | Ht 68.0 in | Wt 203.8 lb

## 2021-03-30 DIAGNOSIS — I5032 Chronic diastolic (congestive) heart failure: Secondary | ICD-10-CM

## 2021-03-30 DIAGNOSIS — N2581 Secondary hyperparathyroidism of renal origin: Secondary | ICD-10-CM

## 2021-03-30 DIAGNOSIS — E1140 Type 2 diabetes mellitus with diabetic neuropathy, unspecified: Secondary | ICD-10-CM

## 2021-03-30 DIAGNOSIS — Z992 Dependence on renal dialysis: Secondary | ICD-10-CM

## 2021-03-30 DIAGNOSIS — R972 Elevated prostate specific antigen [PSA]: Secondary | ICD-10-CM

## 2021-03-30 DIAGNOSIS — E785 Hyperlipidemia, unspecified: Secondary | ICD-10-CM | POA: Diagnosis not present

## 2021-03-30 DIAGNOSIS — I350 Nonrheumatic aortic (valve) stenosis: Secondary | ICD-10-CM

## 2021-03-30 DIAGNOSIS — R7989 Other specified abnormal findings of blood chemistry: Secondary | ICD-10-CM

## 2021-03-30 DIAGNOSIS — E1169 Type 2 diabetes mellitus with other specified complication: Secondary | ICD-10-CM | POA: Diagnosis not present

## 2021-03-30 DIAGNOSIS — Z87891 Personal history of nicotine dependence: Secondary | ICD-10-CM

## 2021-03-30 DIAGNOSIS — K219 Gastro-esophageal reflux disease without esophagitis: Secondary | ICD-10-CM

## 2021-03-30 DIAGNOSIS — N186 End stage renal disease: Secondary | ICD-10-CM

## 2021-03-30 DIAGNOSIS — I10 Essential (primary) hypertension: Secondary | ICD-10-CM

## 2021-03-30 LAB — POCT GLYCOSYLATED HEMOGLOBIN (HGB A1C): Hemoglobin A1C: 9.8 % — AB (ref 4.0–5.6)

## 2021-03-30 MED ORDER — BASAGLAR KWIKPEN 100 UNIT/ML ~~LOC~~ SOPN: 10.0000 [IU] | PEN_INJECTOR | Freq: Every day | SUBCUTANEOUS | 5 refills | Status: DC

## 2021-03-30 NOTE — Patient Instructions (Addendum)
Schedule eye exam and have a copy faxed to [place] at [phone]. ? ?we will increase basaglar to 10 units and I want him to update me in 1 week with how blood sugars are doing as I will likely increase more ? ?[facility] please print lipid panel and PSA I ordered- have him take this to dialysis and ask if they can fax [place] the results ?-also check POC A1c today for diabetes ? ?Recommended follow up: Return in about 14 weeks (around 07/06/2021) for follow up- or sooner if needed. ?

## 2021-03-30 NOTE — Addendum Note (Signed)
Addended by: Loura Back on: 03/30/2021 03:10 PM ? ? Modules accepted: Orders ? ?

## 2021-03-30 NOTE — Addendum Note (Signed)
Addended by: Clyde Lundborg A on: 03/30/2021 02:54 PM ? ? Modules accepted: Orders ? ?

## 2021-04-14 ENCOUNTER — Telehealth: Payer: Self-pay | Admitting: Family Medicine

## 2021-04-14 MED ORDER — BASAGLAR KWIKPEN 100 UNIT/ML ~~LOC~~ SOPN: 10.0000 [IU] | PEN_INJECTOR | Freq: Every day | SUBCUTANEOUS | 5 refills | Status: DC

## 2021-04-14 NOTE — Telephone Encounter (Signed)
Rx refilled.

## 2021-04-14 NOTE — Telephone Encounter (Signed)
.. ?  Encourage patient to contact the pharmacy for refills or they can request refills through [facility] ? ?LAST APPOINTMENT DATE:  03/30/21 ? ?NEXT APPOINTMENT DATE:07/06/21 ? ?MEDICATION:Insulin Glargine (BASAGLAR KWIKPEN) 100 UNIT/ML ? ?Is the patient out of medication? Yes  ? ?PHARMACY:[facility] on file   ? ?Let patient know to contact pharmacy at the end of the day to make sure medication is ready. ? ?Please notify patient to allow 48-72 hours to process  ?

## 2021-04-15 ENCOUNTER — Ambulatory Visit (INDEPENDENT_AMBULATORY_CARE_PROVIDER_SITE_OTHER): Payer: Medicare Other

## 2021-04-15 ENCOUNTER — Other Ambulatory Visit: Payer: Self-pay

## 2021-04-15 DIAGNOSIS — Z0000 Encounter for general adult medical examination without abnormal findings: Secondary | ICD-10-CM

## 2021-04-15 NOTE — Patient Instructions (Addendum)
Edgar Brewer , ?Thank you for taking time to come for your Medicare Wellness Visit. I appreciate your ongoing commitment to your health goals. Please review the following plan we discussed and let me know if I can assist you in the future.  ? ?Screening recommendations/referrals: ?Colonoscopy: Discontinue  ?Recommended yearly ophthalmology/optometry visit for glaucoma screening and checkup ?Recommended yearly dental visit for hygiene and checkup ? ?Vaccinations: ?Influenza vaccine: Done 12/10/20 repeat every year  ?Pneumococcal vaccine: Up to date ?Tdap vaccine: Done 06/11/15 repeat every 10 years  ?Shingles vaccine: Shingrix discussed. Please contact your pharmacy for coverage information.    ?Covid-19: Completed 3/10, 4/9, 01/23/20 ? ?Advanced directives: Advance directive discussed with you today. Even though you declined this today please call our office should you change your mind and we can give you the proper paperwork for you to fill out. ? ? ?Conditions/risks identified: none at this time  ? ?Next appointment: Follow up in one year for your annual wellness visit  ? ?Preventive Care 40-64 Years, Male ?Preventive care refers to lifestyle choices and visits with your health care provider that can promote health and wellness. ?What does preventive care include? ?A yearly physical exam. This is also called an annual well check. ?Dental exams once or twice a year. ?Routine eye exams. Ask your health care provider how often you should have your eyes checked. ?Personal lifestyle choices, including: ?Daily care of your teeth and gums. ?Regular physical activity. ?Eating a healthy diet. ?Avoiding tobacco and drug use. ?Limiting alcohol use. ?Practicing safe sex. ?Taking low-dose aspirin every day starting at age 67. ?What happens during an annual well check? ?The services and screenings done by your health care provider during your annual well check will depend on your age, overall health, lifestyle risk factors, and  family history of disease. ?Counseling  ?Your health care provider may ask you questions about your: ?Alcohol use. ?Tobacco use. ?Drug use. ?Emotional well-being. ?Home and relationship well-being. ?Sexual activity. ?Eating habits. ?Work and work Statistician. ?Screening  ?You may have the following tests or measurements: ?Height, weight, and BMI. ?Blood pressure. ?Lipid and cholesterol levels. These may be checked every 5 years, or more frequently if you are over 36 years old. ?Skin check. ?Lung cancer screening. You may have this screening every year starting at age 3 if you have a 30-pack-year history of smoking and currently smoke or have quit within the past 15 years. ?Fecal occult blood test (FOBT) of the stool. You may have this test every year starting at age 79. ?Flexible sigmoidoscopy or colonoscopy. You may have a sigmoidoscopy every 5 years or a colonoscopy every 10 years starting at age 83. ?Prostate cancer screening. Recommendations will vary depending on your family history and other risks. ?Hepatitis C blood test. ?Hepatitis B blood test. ?Sexually transmitted disease (STD) testing. ?Diabetes screening. This is done by checking your blood sugar (glucose) after you have not eaten for a while (fasting). You may have this done every 1-3 years. ?Discuss your test results, treatment options, and if necessary, the need for more tests with your health care provider. ?Vaccines  ?Your health care provider may recommend certain vaccines, such as: ?Influenza vaccine. This is recommended every year. ?Tetanus, diphtheria, and acellular pertussis (Tdap, Td) vaccine. You may need a Td booster every 10 years. ?Zoster vaccine. You may need this after age 59. ?Pneumococcal 13-valent conjugate (PCV13) vaccine. You may need this if you have certain conditions and have not been vaccinated. ?Pneumococcal polysaccharide (PPSV23) vaccine. You may  need one or two doses if you smoke cigarettes or if you have certain  conditions. ?Talk to your health care provider about which screenings and vaccines you need and how often you need them. ?This information is not intended to replace advice given to you by your health care provider. Make sure you discuss any questions you have with your health care provider. ?Document Released: 02/06/2015 Document Revised: 09/30/2015 Document Reviewed: 11/11/2014 ?Elsevier Interactive Patient Education ? 2017 [place]. ? ?Fall Prevention in the Home ?Falls can cause injuries. They can happen to people of all ages. There are many things you can do to make your home safe and to help prevent falls. ?What can I do on the outside of my home? ?Regularly fix the edges of walkways and driveways and fix any cracks. ?Remove anything that might make you trip as you walk through a door, such as a raised step or threshold. ?Trim any bushes or trees on the path to your home. ?Use bright outdoor lighting. ?Clear any walking paths of anything that might make someone trip, such as rocks or tools. ?Regularly check to see if handrails are loose or broken. Make sure that both sides of any steps have handrails. ?Any raised decks and porches should have guardrails on the edges. ?Have any leaves, snow, or ice cleared regularly. ?Use sand or salt on walking paths during winter. ?Clean up any spills in your garage right away. This includes oil or grease spills. ?What can I do in the bathroom? ?Use night lights. ?Install grab bars by the toilet and in the tub and shower. Do not use towel bars as grab bars. ?Use non-skid mats or decals in the tub or shower. ?If you need to sit down in the shower, use a plastic, non-slip stool. ?Keep the floor dry. Clean up any water that spills on the floor as soon as it happens. ?Remove soap buildup in the tub or shower regularly. ?Attach bath mats securely with double-sided non-slip rug tape. ?Do not have throw rugs and other things on the floor that can make you trip. ?What can I do in  the bedroom? ?Use night lights. ?Make sure that you have a light by your bed that is easy to reach. ?Do not use any sheets or blankets that are too big for your bed. They should not hang down onto the floor. ?Have a firm chair that has side arms. You can use this for support while you get dressed. ?Do not have throw rugs and other things on the floor that can make you trip. ?What can I do in the kitchen? ?Clean up any spills right away. ?Avoid walking on wet floors. ?Keep items that you use a lot in easy-to-reach places. ?If you need to reach something above you, use a strong step stool that has a grab bar. ?Keep electrical cords out of the way. ?Do not use floor polish or wax that makes floors slippery. If you must use wax, use non-skid floor wax. ?Do not have throw rugs and other things on the floor that can make you trip. ?What can I do with my stairs? ?Do not leave any items on the stairs. ?Make sure that there are handrails on both sides of the stairs and use them. Fix handrails that are broken or loose. Make sure that handrails are as long as the stairways. ?Check any carpeting to make sure that it is firmly attached to the stairs. Fix any carpet that is loose or worn. ?Avoid having  throw rugs at the top or bottom of the stairs. If you do have throw rugs, attach them to the floor with carpet tape. ?Make sure that you have a light switch at the top of the stairs and the bottom of the stairs. If you do not have them, ask someone to add them for you. ?What else can I do to help prevent falls? ?Wear shoes that: ?Do not have high heels. ?Have rubber bottoms. ?Are comfortable and fit you well. ?Are closed at the toe. Do not wear sandals. ?If you use a stepladder: ?Make sure that it is fully opened. Do not climb a closed stepladder. ?Make sure that both sides of the stepladder are locked into place. ?Ask someone to hold it for you, if possible. ?Clearly mark and make sure that you can see: ?Any grab bars or  handrails. ?First and last steps. ?Where the edge of each step is. ?Use tools that help you move around (mobility aids) if they are needed. These include: ?Canes. ?Walkers. ?Scooters. ?Crutches. ?Turn on the lights when y

## 2021-04-15 NOTE — Progress Notes (Addendum)
Virtual Visit via Telephone Note ? ?I connected with  Edgar Brewer on 04/15/21 at  3:00 PM EDT by telephone and verified that I am speaking with the correct person using two identifiers. ? ?Medicare Annual Wellness visit completed telephonically due to Covid-19 pandemic.  ? ?Persons participating in this call: This Health Coach and this patient and Mother  ? ?Location: ?Patient: Home ?Provider: Office  ?  ?I discussed the limitations, risks, security and privacy concerns of performing an evaluation and management service by telephone and the availability of in person appointments. The patient expressed understanding and agreed to proceed. ? ?Unable to perform video visit due to video visit attempted and failed and/or patient does not have video capability.  ? ?Some vital signs may be absent or patient reported.  ? ?Willette Brace, LPN ? ? ?Subjective:  ? Edgar Brewer is a 61 y.o. male who presents for an Initial Medicare Annual Wellness Visit. ? ?Review of Systems    ? ?Cardiac Risk Factors include: advanced age (>15mn, >>73women);hypertension;diabetes mellitus;male gender;obesity (BMI >30kg/m2);sedentary lifestyle ? ?   ?Objective:  ?  ?There were no vitals filed for this visit. ?There is no height or weight on file to calculate BMI. ? ? ?  04/15/2021  ?  3:02 PM 02/19/2021  ?  9:09 AM 01/29/2021  ? 11:25 AM 05/17/2020  ?  1:28 PM 11/14/2018  ?  4:59 PM 10/19/2018  ?  4:19 AM 10/03/2018  ?  4:05 AM  ?Advanced Directives  ?Does Patient Have a Medical Advance Directive? No No No No No No No  ?Would patient like information on creating a medical advance directive? No - Patient declined No - Patient declined  No - Patient declined No - Patient declined No - Patient declined No - Patient declined  ? ? ?Current Medications (verified) ?Outpatient Encounter Medications as of 04/15/2021  ?Medication Sig  ? aspirin EC 81 MG tablet Take 81 mg by mouth daily. Swallow whole.  ? b complex-vitamin c-folic acid (NEPHRO-VITE) 0.8 MG TABS  tablet Take 1 tablet by mouth in the morning.  ? cinacalcet (SENSIPAR) 60 MG tablet Take 60 mg by mouth every Tuesday, Thursday, and Saturday at 6 PM. In the evening.  ? gabapentin (NEURONTIN) 300 MG capsule Take 1 capsule (300 mg total) by mouth at bedtime.  ? ibuprofen (ADVIL) 200 MG tablet Take 400 mg by mouth every 8 (eight) hours as needed (pain.).  ? Insulin Glargine (BASAGLAR KWIKPEN) 100 UNIT/ML Inject 10-25 Units into the skin daily. Please provide pen needles  ? Insulin Pen Needle 31G X 8 MM MISC Use to inject insulin into the skin daily. Dx: E11.9  ? omeprazole (PRILOSEC) 20 MG capsule Take 1 capsule (20 mg total) by mouth daily. (Patient taking differently: Take 20 mg by mouth daily with supper.)  ? rosuvastatin (CRESTOR) 10 MG tablet Take 1 tablet (10 mg total) by mouth daily. (Patient taking differently: Take 10 mg by mouth every evening.)  ? sevelamer carbonate (RENVELA) 800 MG tablet Take 2 tablets (1,600 mg total) by mouth 3 (three) times daily with meals.  ? ?No facility-administered encounter medications on file as of 04/15/2021.  ? ? ?Allergies (verified) ?Codeine and Clindamycin/lincomycin  ? ?History: ?Past Medical History:  ?Diagnosis Date  ? Acute on chronic combined systolic and diastolic CHF, [place] 1 [place] 01/13/[place]  ? Allergy   ? Arthritis   ? Chronic kidney disease   ? MWF HRicharda Blade  ? Diabetes mellitus type 2  with complications ([place])   ? Previous insulin, non-insulin requiring noted 09/2018.  Complications include retinopathy, peripheral neuropathy.  ? GERD (gastroesophageal reflux disease)   ? at times  ? Head injury with loss of consciousness ([place])   ? History of kidney stones   ? Hypertension   ? Neuromuscular disorder ([place])   ? Neuropathy   ? Pulmonary hypertension ([place])   ? PA peak pressure 41 mmHg 03/04/16 echo  ? Stroke [facility])   ? no residual weakness  ? Varicose veins of lower extremities with complications 50/35/4656  ? ?Past Surgical History:  ?Procedure Laterality Date  ? AV  FISTULA PLACEMENT Left 06/01/2016  ? Procedure: ARTERIOVENOUS (AV) FISTULA CREATION;  Surgeon: [name], MD;  Location: [facility] OR;  Service: Vascular;  Laterality: Left;  ? AV FISTULA PLACEMENT Left 08/10/2017  ? Procedure: BRACHIOCEPHALIC ARTERIOVENOUS (AV) FISTULA CREATION LEFT UPPER EXTREMITY;  Surgeon: [name], MD;  Location: [place];  Service: Vascular;  Laterality: Left;  ? CHOLECYSTECTOMY N/A 10/04/2018  ? Procedure: LAPAROSCOPIC CHOLECYSTECTOMY;  Surgeon: Kinsinger, Arta Bruce, MD;  Location: [place];  Service: General;  Laterality: N/A;  ? COLONOSCOPY W/ POLYPECTOMY  03/2016  ? For evaluation of iron deficiency anemia.  Dr. Silverio Decamp.  10 polyps, largest 20 mm.  Pathology: tubular adenomas with no high-grade dysplasia, hyperplastic.  None bleeding internal hemorrhoids.  Pandiverticulosis.  ? CYSTOSCOPY WITH RETROGRADE PYELOGRAM, URETEROSCOPY AND STENT PLACEMENT Bilateral 02/25/2021  ? Procedure: CYSTOSCOPY WITH BILATERAL  RETROGRADE PYELOGRAM, RIGHT DIAGNOSTIC URETEROSCOPY,  BLADDER BIOPSY WITH FULGERATION;  Surgeon: Alexis Frock, MD;  Location: [facility];  Service: Urology;  Laterality: Bilateral;  ? ENDOVENOUS ABLATION SAPHENOUS VEIN W/ LASER Left 07-23-2015  ? endovenous laser ablation left greater saphenous vein by [name]  ? ENDOVENOUS ABLATION SAPHENOUS VEIN W/ LASER Right 10/15/2015  ? EVLA right greater saphenous vein by [name]  ? ESOPHAGOGASTRODUODENOSCOPY  03/2016  ? For evaluation of IDA.  Dr. Silverio Decamp.  Duodenal erythema, pathology benign mucosa, no villous atrophy.  ? GALLBLADDER SURGERY  10/2018  ? HOLMIUM LASER APPLICATION Right 08/24/2749  ? Procedure: HOLMIUM LASER OF TUMOR;  Surgeon: Alexis Frock, MD;  Location: [facility];  Service: Urology;  Laterality: Right;  ? IR RADIOLOGIST EVAL & MGMT  11/29/2018  ? TEE WITHOUT CARDIOVERSION N/A 11/19/2018  ? Procedure: TRANSESOPHAGEAL ECHOCARDIOGRAM (TEE);  Surgeon: Acie Fredrickson Wonda Cheng, MD;  Location: [place];  Service:  Cardiovascular;  Laterality: N/A;  ? ?Family History  ?Problem Relation Age of Onset  ? Arthritis Mother   ? Hearing loss Father   ? Prostate cancer Father   ? Hyperlipidemia Maternal Grandmother   ? Heart disease Maternal Grandmother   ? Lung cancer Maternal Grandfather   ? ?Social History  ? ?Socioeconomic History  ? Marital status: Single  ?  Spouse name: Not on file  ? Number of children: Not on file  ? Years of education: Not on file  ? Highest education level: Not on file  ?Occupational History  ? Not on file  ?Tobacco Use  ? Smoking status: Former  ?  Packs/day: 1.00  ?  Years: 45.00  ?  Pack years: 45.00  ?  Types: Cigarettes  ?  Quit date: 01/13/2015  ?  Years since quitting: 6.2  ? Smokeless tobacco: Never  ?Vaping Use  ? Vaping Use: Never used  ?Substance and Sexual Activity  ? Alcohol use: Yes  ?  Alcohol/week: 1.0 standard drink  ?  Types: 1 Glasses of  wine per week  ?  Comment: rare occassion  ? Drug use: No  ? Sexual activity: Not Currently  ?Other Topics Concern  ? Not on file  ?Social History Narrative  ? Separated for many years over 66 years/practical divorce. No Kids. No pets.  Lives with mom Edgar Brewer  ?   ? Disabled- from dialysis and diabetes. 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   ? Hobbies: play cards, play poker, bet on horses  ? ?Social Determinants of Health  ? ?Financial Resource Strain: Low Risk   ? Difficulty of Paying Living Expenses: Not hard at all  ?Food Insecurity: No Food Insecurity  ? Worried About Charity fundraiser in the Last Year: Never true  ? Ran Out of Food in the Last Year: Never true  ?Transportation Needs: No Transportation Needs  ? Lack of Transportation (Medical): No  ? Lack of Transportation (Non-Medical): No  ?Physical Activity: Inactive  ? Days of Exercise per Week: 0 days  ? Minutes of Exercise per Session: 0 min  ?Stress: No Stress Concern Present  ? Feeling of Stress : Not at all  ?Social Connections: Socially Isolated  ?  Frequency of Communication with Friends and Family: Never  ? Frequency of Social Gatherings with Friends and Family: Once a week  ? Attends Religious Services: More than 4 times per year  ? Active Member of [employer]

## 2021-07-01 ENCOUNTER — Telehealth: Payer: Self-pay | Admitting: Family Medicine

## 2021-07-01 NOTE — Telephone Encounter (Signed)
Chart reviewed- plan was to have team reach out about blood sugars but he is scheduled for next week

## 2021-07-06 ENCOUNTER — Ambulatory Visit (INDEPENDENT_AMBULATORY_CARE_PROVIDER_SITE_OTHER): Payer: Medicare Other | Admitting: Family Medicine

## 2021-07-06 ENCOUNTER — Telehealth: Payer: Self-pay

## 2021-07-06 ENCOUNTER — Encounter: Payer: Self-pay | Admitting: Family Medicine

## 2021-07-06 VITALS — BP 110/62 | HR 80 | Temp 98.6°F | Ht 68.0 in | Wt 209.2 lb

## 2021-07-06 DIAGNOSIS — I10 Essential (primary) hypertension: Secondary | ICD-10-CM

## 2021-07-06 DIAGNOSIS — E1140 Type 2 diabetes mellitus with diabetic neuropathy, unspecified: Secondary | ICD-10-CM | POA: Diagnosis not present

## 2021-07-06 DIAGNOSIS — R7989 Other specified abnormal findings of blood chemistry: Secondary | ICD-10-CM

## 2021-07-06 DIAGNOSIS — R972 Elevated prostate specific antigen [PSA]: Secondary | ICD-10-CM

## 2021-07-06 LAB — CBC WITH DIFFERENTIAL/PLATELET
Basophils Absolute: 0 10*3/uL (ref 0.0–0.1)
Basophils Relative: 0.7 % (ref 0.0–3.0)
Eosinophils Absolute: 0.2 10*3/uL (ref 0.0–0.7)
Eosinophils Relative: 2.3 % (ref 0.0–5.0)
HCT: 38.4 % — ABNORMAL LOW (ref 39.0–52.0)
Hemoglobin: 13.2 g/dL (ref 13.0–17.0)
Lymphocytes Relative: 14.9 % (ref 12.0–46.0)
Lymphs Abs: 1 10*3/uL (ref 0.7–4.0)
MCHC: 34.3 g/dL (ref 30.0–36.0)
MCV: 90.2 fl (ref 78.0–100.0)
Monocytes Absolute: 0.4 10*3/uL (ref 0.1–1.0)
Monocytes Relative: 6.1 % (ref 3.0–12.0)
Neutro Abs: 5.2 10*3/uL (ref 1.4–7.7)
Neutrophils Relative %: 76 % (ref 43.0–77.0)
Platelets: 163 10*3/uL (ref 150.0–400.0)
RBC: 4.25 Mil/uL (ref 4.22–5.81)
RDW: 14.3 % (ref 11.5–15.5)
WBC: 6.8 10*3/uL (ref 4.0–10.5)

## 2021-07-06 LAB — COMPREHENSIVE METABOLIC PANEL
ALT: 31 U/L (ref 0–53)
AST: 20 U/L (ref 0–37)
Albumin: 4.4 g/dL (ref 3.5–5.2)
Alkaline Phosphatase: 317 U/L — ABNORMAL HIGH (ref 39–117)
BUN: 41 mg/dL — ABNORMAL HIGH (ref 6–23)
CO2: 28 mEq/L (ref 19–32)
Calcium: 9.4 mg/dL (ref 8.4–10.5)
Chloride: 90 mEq/L — ABNORMAL LOW (ref 96–112)
Creatinine, Ser: 8.03 mg/dL (ref 0.40–1.50)
GFR: 6.64 mL/min — CL (ref >60.00)
Glucose, Bld: 386 mg/dL — ABNORMAL HIGH (ref 70–99)
Potassium: 5.2 mEq/L — ABNORMAL HIGH (ref 3.5–5.1)
Sodium: 133 mEq/L — ABNORMAL LOW (ref 135–145)
Total Bilirubin: 0.6 mg/dL (ref 0.2–1.2)
Total Protein: 7 g/dL (ref 6.0–8.3)

## 2021-07-06 LAB — LIPID PANEL
Cholesterol: 147 mg/dL (ref 0–200)
HDL: 36.1 mg/dL — ABNORMAL LOW (ref >39.00)
Total CHOL/HDL Ratio: 4
Triglycerides: 529 mg/dL — ABNORMAL HIGH (ref 0.0–149.0)

## 2021-07-06 LAB — HEMOGLOBIN A1C: Hgb A1c MFr Bld: 9.5 % — ABNORMAL HIGH (ref 4.6–6.5)

## 2021-07-06 LAB — LDL CHOLESTEROL, DIRECT: Direct LDL: 41 mg/dL

## 2021-07-06 LAB — PSA: PSA: 4.22 ng/mL — ABNORMAL HIGH (ref 0.10–4.00)

## 2021-07-06 NOTE — Patient Instructions (Addendum)
-  increase basaglar to 22 units for a week- if no sugars under 150 then... -increase basaglar to 24 units for a week and if no sugars under 150 then... Increase basaglar to 25 units for a week then update me on blood sugars after a week - try to improve diet  Please stop by lab before you go If you have mychart- we will send your results within 3 business days of [place] receiving them.  If you do not have mychart- we will call you about results within 5 business days of [place] receiving them.  *please also note that you will see labs on mychart as soon as they post. I will later go in and write notes on them- will say "notes from Dr. Yong Channel"   Recommended follow up: Return in about 14 weeks (around 10/12/2021) for followup or sooner if needed.Schedule b4 you leave.

## 2021-07-06 NOTE — Progress Notes (Signed)
Phone [phone] In person visit   Subjective:   Edgar Brewer is a 61 y.o. year old very pleasant male patient who presents for/with See problem oriented charting Chief Complaint  Patient presents with   Follow-up   Hyperlipidemia   eye discoloration    Pt states mom and brother noticed eyes looking a milky white/yellow in color.   Past Medical History-  Patient Active Problem List   Diagnosis Date Noted   History of stroke 02/25/2020    Priority: High   ESRD (end stage renal disease) on dialysis ([place]) 10/03/2018    Priority: High   Diabetes mellitus type II, non insulin dependent ([place]) 01/14/2015    Priority: High   Chronic diastolic CHF (congestive heart failure) ([place]) 01/13/2015    Priority: High   GERD (gastroesophageal reflux disease) 03/25/2020    Priority: Medium    Aortic stenosis 02/25/2020    Priority: Medium    Type 2 diabetes mellitus with diabetic neuropathy, unspecified ([place]) 02/25/2020    Priority: Medium    Gallbladder abscess 11/15/2018    Priority: Medium    Elevated LFTs 10/18/2018    Priority: Medium    Other disorders of phosphorus metabolism 01/19/2018    Priority: Medium    Secondary hyperparathyroidism of renal origin ([place]) 09/27/2017    Priority: Medium    Essential hypertension 01/14/2015    Priority: Medium    Anemia of chronic kidney failure 01/14/2015    Priority: Medium    Varicose veins of lower extremities with complications 14/48/1856    Priority: Medium    CKD (chronic kidney disease), stage IV ([place]) 01/14/2015    Priority: Low   Obesity 01/14/2015    Priority: Low   Chronic venous insufficiency 10/22/2014    Priority: Low   Wound of left leg 05/08/2020   Left leg cellulitis 05/08/2020    Medications- reviewed and updated Current Outpatient Medications  Medication Sig Dispense Refill   aspirin EC 81 MG tablet Take 81 mg by mouth daily. Swallow whole.     b complex-vitamin c-folic acid (NEPHRO-VITE) 0.8 MG TABS tablet Take  1 tablet by mouth in the morning.     cinacalcet (SENSIPAR) 60 MG tablet Take 60 mg by mouth every Tuesday, Thursday, and Saturday at 6 PM. In the evening.     gabapentin (NEURONTIN) 300 MG capsule Take 1 capsule (300 mg total) by mouth at bedtime. 90 capsule 3   Insulin Glargine (BASAGLAR KWIKPEN) 100 UNIT/ML Inject 10-25 Units into the skin daily. Please provide pen needles 15 mL 5   Insulin Pen Needle 31G X 8 MM MISC Use to inject insulin into the skin daily. Dx: E11.9 100 each 3   omeprazole (PRILOSEC) 20 MG capsule Take 1 capsule (20 mg total) by mouth daily. (Patient taking differently: Take 20 mg by mouth daily with supper.) 90 capsule 3   rosuvastatin (CRESTOR) 10 MG tablet Take 1 tablet (10 mg total) by mouth daily. (Patient taking differently: Take 10 mg by mouth every evening.) 90 tablet 3   sevelamer carbonate (RENVELA) 800 MG tablet Take 2 tablets (1,600 mg total) by mouth 3 (three) times daily with meals.     No current facility-administered medications for this visit.     Objective:  BP 110/62   Pulse 80   Temp 98.6 F (37 C)   Ht '5\' 8"'$  (1.727 m)   Wt 209 lb 3.2 oz (94.9 kg)   SpO2 96%   BMI 31.81 kg/m  Gen: NAD, resting  comfortably CV: RRR no murmurs rubs or gallops Lungs: CTAB no crackles, wheeze, rhonchi Abdomen: soft/nontender/nondistended/normal bowel sounds.  Ext: no edema, av fistula left arm Diabetic Foot Exam - Simple   Simple Foot Form Diabetic Foot exam was performed with the following findings: Yes 07/06/2021  1:52 PM  Visual Inspection No deformities, no ulcerations, no other skin breakdown bilaterally: Yes Sensation Testing See comments: Yes Pulse Check Posterior Tibialis and Dorsalis pulse intact bilaterally: Yes Comments Monofilament not detected on bottom of feet but is on top        Assessment and Plan   #Cancer screenings - declines colonoscopy despite precancerous polyp history - PSA high normal previously- plan was for psa recheck with  dialysis but do not see completed- will check psa . Has seen urology/established with urology and had cystoscopy to rule out ureteral mass   #End-stage renal disease on hemodialysis since January 2019. thorugh related to diabetes #Secondary hyperparathyroidism S: Patient on dialysis followed by Dr. Lorrene Reid on MWF at [facility] site  -AV fistula 06/01/2016 by Dr. Donnetta Hutching A/P: overall stable- continue follow up with nephrology  -Patient compliant with Sensipar for secondary hyperparathyroidism -On Renvela phosphate binder  #Heart failure with preserved ejection fraction-follows with Dr. [name] S: Medication: lasix in past, now managed by dialysis   Edema: no increase Weight gain:slight weight gain- but feels like dietary  Shortness of breath: no increase  A/P: heart failure reasonably stable- continue current medicine   #hypertension S: medication:  no meds since dialysis BP Readings from Last 3 Encounters:  07/06/21 110/62  03/30/21 108/60  02/25/21 (!) 109/57  A/P: Controlled. Continue current medications.     # Diabetes with neuropathy S: Medication: gabapentin '300mg'$  BID (hands and feet bother him some), basaglar now up to 20 units-  has gotten down to 220s lately CBGs- sensitive to sugars in 70s or 80s- hasnt happened recently. Often 250s or as high as 300s in past but ipmroving Lab Results  Component Value Date   HGBA1C 9.8 (A) 03/30/2021  A/P: update a1c with labs today- hopefully improved but from sugars still si too high -increase basaglar to 22 units for a week- if no sugars under 150 then... -increase basaglar to 24 units for a week and if no sugars under 150 then... Increase basaglar to 25 units for a week then update me on blood sugars after a week - try to improve diet -declines diabetes education  Neuropathy imperfectly controlled but we dont have space to increase gabapentin with his dialysis  #history of stroke- now on aspirin '81mg'$ - stutter lingers from  this #hyperlipidemia S: Medication: rosuvastatin 10 mg daily   Lab Results  Component Value Date   CHOL 212 (H) 02/25/2020   HDL 38.40 (L) 02/25/2020   LDLDIRECT 49.0 12/10/2020   TRIG (H) 02/25/2020    433.0 Triglyceride is over 400; calculations on Lipids are invalid.   CHOLHDL 6 02/25/2020   A/P: we have had trouble getting labs from dialysis- we will order lipid panel today. Ldl goal under 70 with stroke history  #Elevated LFTs S: Patient with laparoscopic cholecystectomy September 9417 complicated by gallbladder fossa abscess status post percutaneous aspiration and JP drain in October 2020.  Ultimately developed sepsis with bacteremia.  LFTs were slowly improving per notes on February 07, 2019 visit with Dr. Dyann Kief was for repeat LFTs in 3 months, avoid hepatotoxins, NSAIDs, alcohol, OTC meds -They  had been planning for repeat colonoscopy - but patient has declined as above - he  notes slight yellowing of eyes. He denies drining alcohol  A/P: with some reporteed yellowing of eyes (I do not see) will check LFTS   Recommended follow up: Return in about 14 weeks (around 10/12/2021) for followup or sooner if needed.Schedule b4 you leave. Future Appointments  Date Time Provider [place]  04/29/2022  3:15 PM [facility] HEALTH COACH [facility]    Lab/Order associations:   ICD-10-CM   1. Elevated PSA  R97.20 PSA    2. Type 2 diabetes mellitus with diabetic neuropathy, without long-term current use of insulin (HCC)  E11.40 CBC with Differential/Platelet    Comprehensive metabolic panel    Lipid panel    Hemoglobin A1c    3. Elevated LFTs  R79.89 Comprehensive metabolic panel    4. Essential hypertension  I10 CBC with Differential/Platelet    Comprehensive metabolic panel    Lipid panel      No orders of the defined types were placed in this encounter.   Return precautions advised.  Garret Reddish, MD

## 2021-07-06 NOTE — Telephone Encounter (Signed)
Noted-known dialysis patient

## 2021-07-06 NOTE — Telephone Encounter (Signed)
CRITICAL LAB:  Creatinine @ 8.03 GFR @ 6.64

## 2021-07-07 ENCOUNTER — Other Ambulatory Visit: Payer: Self-pay

## 2021-07-07 DIAGNOSIS — R972 Elevated prostate specific antigen [PSA]: Secondary | ICD-10-CM

## 2021-07-08 ENCOUNTER — Other Ambulatory Visit: Payer: Self-pay

## 2021-07-08 DIAGNOSIS — R972 Elevated prostate specific antigen [PSA]: Secondary | ICD-10-CM

## 2021-08-04 LAB — HEMOGLOBIN A1C: Hemoglobin A1C: 9.9

## 2021-08-17 ENCOUNTER — Telehealth: Payer: Self-pay | Admitting: Family Medicine

## 2021-08-17 NOTE — Telephone Encounter (Signed)
FYI--Joy from [facility] called in regards to patient's request for their records to be sent to [place]. I confirmed our fax number with Joy. We have received the records via fax and have placed these in provider's box.

## 2021-08-18 NOTE — Telephone Encounter (Signed)
Noted  

## 2021-10-04 ENCOUNTER — Telehealth: Payer: Self-pay | Admitting: Family Medicine

## 2021-10-04 MED ORDER — ROSUVASTATIN CALCIUM 10 MG PO TABS: 10.0000 mg | ORAL_TABLET | Freq: Every day | ORAL | 3 refills | Status: DC

## 2021-10-04 NOTE — Telephone Encounter (Signed)
..   Encourage patient to contact the pharmacy for refills or they can request refills through [place]:  Please schedule appointment if longer than 1 year  NEXT APPOINTMENT DATE: 9/19   MEDICATION:rosuvastatin (CRESTOR) 10 MG tablet   Is the patient out of medication?  Yes   PHARMACY: [facility]   Let patient know to contact pharmacy at the end of the day to make sure medication is ready.  Please notify patient to allow 48-72 hours to process

## 2021-10-04 NOTE — Telephone Encounter (Signed)
Rx refilled.

## 2021-10-05 ENCOUNTER — Encounter (HOSPITAL_COMMUNITY): Payer: Self-pay

## 2021-10-06 MED ORDER — ROSUVASTATIN CALCIUM 10 MG PO TABS: 10.0000 mg | ORAL_TABLET | Freq: Every day | ORAL | 3 refills | Status: DC

## 2021-10-06 NOTE — Telephone Encounter (Signed)
Rx resent to pharmacy

## 2021-10-06 NOTE — Telephone Encounter (Signed)
Edgar Brewer (mother) calling in stating pharmacy does not have script.  Is requesting follow up call at [phone].

## 2021-10-06 NOTE — Addendum Note (Signed)
Addended by: Clyde Lundborg A on: 10/06/2021 01:47 PM   Modules accepted: Orders

## 2021-10-12 ENCOUNTER — Ambulatory Visit: Payer: Medicare Other | Admitting: Family Medicine

## 2021-10-18 ENCOUNTER — Encounter: Payer: Self-pay | Admitting: *Deleted

## 2021-10-21 ENCOUNTER — Encounter: Payer: Self-pay | Admitting: Family Medicine

## 2021-10-21 ENCOUNTER — Ambulatory Visit (INDEPENDENT_AMBULATORY_CARE_PROVIDER_SITE_OTHER): Payer: PRIVATE HEALTH INSURANCE | Admitting: Family Medicine

## 2021-10-21 VITALS — BP 118/60 | HR 85 | Temp 98.4°F | Ht 68.0 in | Wt 215.0 lb

## 2021-10-21 DIAGNOSIS — Z794 Long term (current) use of insulin: Secondary | ICD-10-CM

## 2021-10-21 DIAGNOSIS — Z23 Encounter for immunization: Secondary | ICD-10-CM | POA: Diagnosis not present

## 2021-10-21 DIAGNOSIS — I10 Essential (primary) hypertension: Secondary | ICD-10-CM | POA: Diagnosis not present

## 2021-10-21 DIAGNOSIS — R198 Other specified symptoms and signs involving the digestive system and abdomen: Secondary | ICD-10-CM

## 2021-10-21 DIAGNOSIS — N186 End stage renal disease: Secondary | ICD-10-CM | POA: Diagnosis not present

## 2021-10-21 DIAGNOSIS — E1140 Type 2 diabetes mellitus with diabetic neuropathy, unspecified: Secondary | ICD-10-CM

## 2021-10-21 DIAGNOSIS — Z992 Dependence on renal dialysis: Secondary | ICD-10-CM

## 2021-10-21 MED ORDER — INSULIN PEN NEEDLE 31G X 8 MM MISC: 3 refills | Status: DC

## 2021-10-21 MED ORDER — BASAGLAR KWIKPEN 100 UNIT/ML ~~LOC~~ SOPN: 30.0000 [IU] | PEN_INJECTOR | Freq: Every day | SUBCUTANEOUS | 5 refills | Status: DC

## 2021-10-21 NOTE — Progress Notes (Signed)
Phone [phone] In person visit   Subjective:   Edgar Brewer is a 61 y.o. year old very pleasant male patient who presents for/with See problem oriented charting Chief Complaint  Patient presents with   Follow-up    Pt c/o fluid on stomach   Hypertension   Hyperlipidemia   Past Medical History-  Patient Active Problem List   Diagnosis Date Noted   History of stroke 02/25/2020    Priority: High   ESRD (end stage renal disease) on dialysis ([place]) 10/03/2018    Priority: High   Diabetes mellitus type II, non insulin dependent ([place]) 01/14/2015    Priority: High   Chronic diastolic CHF (congestive heart failure) ([place]) 01/13/2015    Priority: High   GERD (gastroesophageal reflux disease) 03/25/2020    Priority: Medium    Aortic stenosis 02/25/2020    Priority: Medium    Type 2 diabetes mellitus with diabetic neuropathy, unspecified ([place]) 02/25/2020    Priority: Medium    Gallbladder abscess 11/15/2018    Priority: Medium    Elevated LFTs 10/18/2018    Priority: Medium    Other disorders of phosphorus metabolism 01/19/2018    Priority: Medium    Secondary hyperparathyroidism of renal origin ([place]) 09/27/2017    Priority: Medium    Essential hypertension 01/14/2015    Priority: Medium    Anemia of chronic kidney failure 01/14/2015    Priority: Medium    Varicose veins of lower extremities with complications 74/25/9563    Priority: Medium    CKD (chronic kidney disease), stage IV ([place]) 01/14/2015    Priority: Low   Obesity 01/14/2015    Priority: Low   Chronic venous insufficiency 10/22/2014    Priority: Low   Wound of left leg 05/08/2020   Left leg cellulitis 05/08/2020    Medications- reviewed and updated Current Outpatient Medications  Medication Sig Dispense Refill   aspirin EC 81 MG tablet Take 81 mg by mouth daily. Swallow whole.     b complex-vitamin c-folic acid (NEPHRO-VITE) 0.8 MG TABS tablet Take 1 tablet by mouth in the morning.     cinacalcet  (SENSIPAR) 60 MG tablet Take 60 mg by mouth every Tuesday, Thursday, and Saturday at 6 PM. In the evening.     gabapentin (NEURONTIN) 300 MG capsule Take 1 capsule (300 mg total) by mouth at bedtime. 90 capsule 3   omeprazole (PRILOSEC) 20 MG capsule Take 1 capsule (20 mg total) by mouth daily. (Patient taking differently: Take 20 mg by mouth daily with supper.) 90 capsule 3   rosuvastatin (CRESTOR) 10 MG tablet Take 1 tablet (10 mg total) by mouth daily. 90 tablet 3   sevelamer carbonate (RENVELA) 800 MG tablet Take 2 tablets (1,600 mg total) by mouth 3 (three) times daily with meals.     Insulin Glargine (BASAGLAR KWIKPEN) 100 UNIT/ML Inject 30-40 Units into the skin daily. Please provide pen needles 15 mL 5   Insulin Pen Needle 31G X 8 MM MISC Use to inject insulin into the skin daily. Dx: E11.9 100 each 3   No current facility-administered medications for this visit.     Objective:  BP 118/60   Pulse 85   Temp 98.4 F (36.9 C)   Ht '5\' 8"'$  (1.727 m)   Wt 215 lb (97.5 kg)   SpO2 94%   BMI 32.69 kg/m  Gen: NAD, resting comfortably CV: RRR no murmurs rubs or gallops Lungs: CTAB no crackles, wheeze, rhonchi Abdomen: Protuberant abdomen somewhat firm to palpation  Ext: Trace edema Skin: warm, dry     Assessment and Plan   #Elevated PSA-[facility]-one of their concerns with limited life expectancy under 10 years likely decreasing the value of aggressive evaluation.  Ultimately they decided on 1-monthrecheck of PSA and repeat clinic visit-we will be on the look out for this report at next visit.  Main goal of evaluation would be to rule out very large/aggressive cancer if PSA elevates further Lab Results  Component Value Date   PSA 4.22 (H) 07/06/2021   PSA 3.90 12/10/2020    #Increased abdominal girth #Unintentional weight gain S: Patient reports increased abdominal girth over the last several months along with weight gain-up 6 pounds from our  June visit-he also reports his nephrologist mentioned this at a visit-does not seem to be gaining weight anywhere but centrally -Denies abdominal pain A/P: Patient states he does not tolerate CT.  He did have a CT in January that did not mention ascites but that is one of his primary concerns-does appear to have some ascites on prior ultrasound in the past-we opted to try to get a copy of most recent LFTs from GI and order abdominal ultrasound at this time if covered -Alkaline phosphatase gradually trending down since 2020-see prior notes about significantly elevated LFTs-rest of liver function test normalized  #End-stage renal disease on hemodialysis since January 2019. thorugh related to diabetes #Secondary hyperparathyroidism S: [facility]  -AV fistula 06/01/2016 by Dr. EDonnetta HutchingA/P: Overall stable with exception of weight gain that seems to be central in abdomen-peripheral edema appears stable -Patient compliant with Sensipar for secondary hyperparathyroidism -On Renvela phosphate binder  #Heart failure with preserved ejection fraction-follows with Dr. NJohnsie CancelS: Medication: lasix in past, now managed by dialysis   Edema: No increase/minimal Weight gain: Up 6 pounds in 3 months Shortness of breath: Denies increase A/P: Possible fluid overload with abdominal girth and weight gain-evaluate further with abdominal ultrasound-for now continue dialysis for managing fluid status otherwise  #hypertension S: medication: None A/P: Remains controlled without medication  #Aortic stenosis S:Last echocardiogram august 2022-reported as very mild  A/P:  stable- 1 year repeat planned per notes- should be after next cardiology visit    # Diabetes with neuropathy S: Medication:basaglar 30 units (titrated up),, gabapentin '300mg'$  BID CBGs- if doing well with diet usually 160s and if slips up into 200s Lab Results  Component Value Date   HGBA1C 9.9  08/04/2021   HGBA1C 9.5 (H) 07/06/2021   HGBA1C 9.8 (A) 03/30/2021  A/P: Unfortunately A1c did not improve on repeat in July but he has been titrating his insulin up from 20 units at last visit-we recommended gradual taper up (see after visit summary) over 5 weeks to potentially 35 units and then updating me with blood sugars  Recommended follow up: Return in about 14 weeks (around 01/27/2022) for followup or sooner if needed.Schedule b4 you leave. Future Appointments  Date Time Provider [place] 02/01/2022  4:00 PM [facility] 04/29/2022  3:15 PM [facility] HEALTH COACH [facility]    Lab/Order associations:   ICD-10-CM   1. [place]D (end stage renal disease) [place]  N18.6    Z99.2     2. Type 2 diabetes mellitus with diabetic neuropathy, with long-term current use of insulin (H[facility])  E11.40    Z79.4     3. Essential hypertension  I10  4. Increased abdominal girth  R19.8 US Abdomen Complete    5. Need for immunization against influenza  Z23 Flu Vaccine QUAD 43moIM (Fluarix, Fluzone & Alfiuria Quad PF)      Meds ordered this encounter  Medications   Insulin Glargine (BASAGLAR KWIKPEN) 100 UNIT/ML    Sig: Inject 30-40 Units into the skin daily. Please provide pen needles    Dispense:  15 mL    Refill:  5   Insulin Pen Needle 31G X 8 MM MISC    Sig: Use to inject insulin into the skin daily. Dx: E11.9    Dispense:  100 each    Refill:  3    Return precautions advised.  SGarret Reddish MD

## 2021-10-21 NOTE — Patient Instructions (Addendum)
Have results of eye exam faxed to [place] at [phone] when you go in Dec.  We discussed possibly using dexcom 7 to be able to monitor sugar more regularly   -increase basaglar to 32 units for a week- if no sugars under 150 then... -increase basaglar to34 units for a week and if no sugars under 150 then... Increase basaglar to 35 units for a week then update me on blood sugars after a week  We will call you within two weeks about your referral for ultrasound of the abdomen through [facility].  Their phone number is [phone].  Please call them if you have not heard in 1-2 weeks  Sign release of information at the check out desk for last 3 months of bloodwork from dialysis center    Recommended follow up: Return in about 14 weeks (around 01/27/2022) for followup or sooner if needed.Schedule b4 you leave.

## 2021-11-04 ENCOUNTER — Ambulatory Visit
Admission: RE | Admit: 2021-11-04 | Discharge: 2021-11-04 | Disposition: A | Discharge status: Home / Self Care | Payer: Medicare (Managed Care) | Source: Ambulatory Visit | Attending: Family Medicine | Admitting: Family Medicine

## 2021-11-04 DIAGNOSIS — R198 Other specified symptoms and signs involving the digestive system and abdomen: Secondary | ICD-10-CM

## 2021-11-08 ENCOUNTER — Telehealth: Payer: Self-pay | Admitting: Family Medicine

## 2021-11-08 NOTE — Telephone Encounter (Signed)
Patient's mother [name] requests to be called at ph# [phone]  to be given Ultrasound results (unable to open MyChart-computer issue)

## 2021-11-08 NOTE — Telephone Encounter (Signed)
Called and spoke with pt mom and results reviewed.

## 2021-11-30 ENCOUNTER — Other Ambulatory Visit (HOSPITAL_COMMUNITY): Payer: Self-pay | Admitting: Nephrology

## 2021-11-30 DIAGNOSIS — R188 Other ascites: Secondary | ICD-10-CM

## 2021-12-02 ENCOUNTER — Ambulatory Visit (HOSPITAL_COMMUNITY)
Admission: RE | Admit: 2021-12-02 | Discharge: 2021-12-02 | Disposition: A | Discharge status: Home / Self Care | Payer: Medicare (Managed Care) | Source: Ambulatory Visit | Attending: Nephrology | Admitting: Nephrology

## 2021-12-02 ENCOUNTER — Other Ambulatory Visit (HOSPITAL_COMMUNITY): Payer: Self-pay | Admitting: Nephrology

## 2021-12-02 DIAGNOSIS — R188 Other ascites: Secondary | ICD-10-CM | POA: Diagnosis present

## 2022-01-10 ENCOUNTER — Other Ambulatory Visit: Payer: Self-pay | Admitting: Family Medicine

## 2022-01-27 ENCOUNTER — Ambulatory Visit: Payer: PRIVATE HEALTH INSURANCE | Admitting: Family Medicine

## 2022-02-01 ENCOUNTER — Ambulatory Visit: Payer: PRIVATE HEALTH INSURANCE | Admitting: Family Medicine

## 2022-02-10 ENCOUNTER — Encounter: Payer: Self-pay | Admitting: Family Medicine

## 2022-02-10 ENCOUNTER — Ambulatory Visit (INDEPENDENT_AMBULATORY_CARE_PROVIDER_SITE_OTHER): Payer: Medicare (Managed Care) | Admitting: Family Medicine

## 2022-02-10 VITALS — BP 120/60 | HR 93 | Temp 98.1°F | Ht 68.0 in | Wt 220.0 lb

## 2022-02-10 DIAGNOSIS — Z794 Long term (current) use of insulin: Secondary | ICD-10-CM

## 2022-02-10 DIAGNOSIS — R7989 Other specified abnormal findings of blood chemistry: Secondary | ICD-10-CM

## 2022-02-10 DIAGNOSIS — I10 Essential (primary) hypertension: Secondary | ICD-10-CM

## 2022-02-10 DIAGNOSIS — E1140 Type 2 diabetes mellitus with diabetic neuropathy, unspecified: Secondary | ICD-10-CM | POA: Diagnosis not present

## 2022-02-10 DIAGNOSIS — I5032 Chronic diastolic (congestive) heart failure: Secondary | ICD-10-CM

## 2022-02-10 DIAGNOSIS — E119 Type 2 diabetes mellitus without complications: Secondary | ICD-10-CM | POA: Diagnosis not present

## 2022-02-10 DIAGNOSIS — N2581 Secondary hyperparathyroidism of renal origin: Secondary | ICD-10-CM

## 2022-02-10 DIAGNOSIS — N186 End stage renal disease: Secondary | ICD-10-CM | POA: Diagnosis not present

## 2022-02-10 DIAGNOSIS — K760 Fatty (change of) liver, not elsewhere classified: Secondary | ICD-10-CM | POA: Insufficient documentation

## 2022-02-10 DIAGNOSIS — Z992 Dependence on renal dialysis: Secondary | ICD-10-CM

## 2022-02-10 LAB — POCT GLYCOSYLATED HEMOGLOBIN (HGB A1C): Hemoglobin A1C: 8 % — AB (ref 4.0–5.6)

## 2022-02-10 MED ORDER — GABAPENTIN 100 MG PO CAPS: 100.0000 mg | ORAL_CAPSULE | Freq: Three times a day (TID) | ORAL | 5 refills | Status: DC

## 2022-02-10 MED ORDER — FLUTICASONE PROPIONATE 50 MCG/ACT NA SUSP: 2.0000 | Freq: Every day | NASAL | 3 refills | Status: DC

## 2022-02-10 NOTE — Patient Instructions (Addendum)
between more prominent floaters and the discoloration you are seeing at times- I want you to call your eye doctor to move up for sooner possible appointment   Please call urology to follow up on elevated PSA  Try gabapentin '100mg'$  three times a day instead of '300mg'$  once a day  Lets try to cut out nighttime snack- would love to get sugars under 140 consistently in AM  Recommended follow up: Return in about 4 months (around 06/11/2022) for followup or sooner if needed.Schedule b4 you leave.

## 2022-02-10 NOTE — Progress Notes (Addendum)
Phone [phone] In person visit   Subjective:   Edgar Brewer is a 62 y.o. year old very pleasant male patient who presents for/with See problem oriented charting Chief Complaint  Patient presents with   Follow-up   Hyperlipidemia   Hypertension   blood in eye    Pt thinks he may have blood in eye, has not contacted eye doc yet.    Past Medical History-  Patient Active Problem List   Diagnosis Date Noted   History of stroke 02/25/2020    Priority: High   ESRD (end stage renal disease) on dialysis ([place]) 10/03/2018    Priority: High   Diabetes mellitus, type II, insulin dependent ([place]) 01/14/2015    Priority: High   Chronic diastolic CHF (congestive heart failure) ([place]) 01/13/2015    Priority: High   Fatty liver 02/10/2022    Priority: Medium    GERD (gastroesophageal reflux disease) 03/25/2020    Priority: Medium    Aortic stenosis 02/25/2020    Priority: Medium    Type 2 diabetes mellitus with diabetic neuropathy, unspecified ([place]) 02/25/2020    Priority: Medium    Gallbladder abscess 11/15/2018    Priority: Medium    Elevated LFTs 10/18/2018    Priority: Medium    Other disorders of phosphorus metabolism 01/19/2018    Priority: Medium    Secondary hyperparathyroidism of renal origin ([place]) 09/27/2017    Priority: Medium    Essential hypertension 01/14/2015    Priority: Medium    Anemia of chronic kidney failure 01/14/2015    Priority: Medium    Varicose veins of lower extremities with complications 84/16/6063    Priority: Medium    Wound of left leg 05/08/2020    Priority: Low   Left leg cellulitis 05/08/2020    Priority: Low   CKD (chronic kidney disease), stage IV ([place]) 01/14/2015    Priority: Low   Obesity 01/14/2015    Priority: Low   Chronic venous insufficiency 10/22/2014    Priority: Low    Medications- reviewed and updated Current Outpatient Medications  Medication Sig Dispense Refill   aspirin EC 81 MG tablet Take 81 mg by mouth daily.  Swallow whole.     b complex-vitamin c-folic acid (NEPHRO-VITE) 0.8 MG TABS tablet Take 1 tablet by mouth in the morning.     cinacalcet (SENSIPAR) 60 MG tablet Take 60 mg by mouth every Tuesday, Thursday, and Saturday at 6 PM. In the evening.     fluticasone (FLONASE) 50 MCG/ACT nasal spray Place 2 sprays into both nostrils daily. 16 g 3   gabapentin (NEURONTIN) 100 MG capsule Take 1 capsule (100 mg total) by mouth 3 (three) times daily. Substitute for '300mg'$  once a day- patient prefers to space out dose 90 capsule 5   Insulin Glargine (BASAGLAR KWIKPEN) 100 UNIT/ML Inject 30-40 Units into the skin daily. Please provide pen needles 15 mL 5   Insulin Pen Needle 31G X 8 MM MISC Use to inject insulin into the skin daily. Dx: E11.9 100 each 3   omeprazole (PRILOSEC) 20 MG capsule Take 1 capsule by mouth once daily 90 capsule 0   rosuvastatin (CRESTOR) 10 MG tablet Take 1 tablet (10 mg total) by mouth daily. 90 tablet 3   sevelamer carbonate (RENVELA) 800 MG tablet Take 2 tablets (1,600 mg total) by mouth 3 (three) times daily with meals.     No current facility-administered medications for this visit.     Objective:  BP 120/60   Pulse 93  Temp 98.1 F (36.7 C)   Ht '5\' 8"'$  (1.727 m)   Wt 220 lb (99.8 kg)   SpO2 97%   BMI 33.45 kg/m  Gen: NAD, resting comfortably CV: RRR stable murmur Lungs: CTAB no crackles, wheeze, rhonchi Abdomen: soft/nontender/nondistended/normal bowel sounds. No rebound or guarding.  Ext: trace edema Skin: warm, dry     Assessment and Plan   # Floaters S:patient has noted issues with mainly his right eye- notes brownish, red color that comes down like a shade then goes back up- only happens for a few seconds and happens randomly. If he looks up is more likely to notice it . Doesn't see any red when he looks in mirror -he has some floaters in general that have been more prominent A/P: between more prominent floaters and the discoloration you are seeing at times-  I want you to call your eye doctor to move up for sooner possible appointment  -I did ask him to do this quickly-very important  From last note "elevated PSA_ [facility]-one of their concerns with limited life expectancy under 10 years likely decreasing the value of aggressive evaluation. Ultimately they decided on 45-monthrecheck of PSA and repeat clinic visit-we will be on the look out for this report at next visit. Main goal of evaluation would be to rule out very large/aggressive cancer if PSA elevates further " -I strongly encouraged follow-up with urology and also wrote out the number for him on his after visit summary to call per his request  #End-stage renal disease on hemodialysis since January 2019. thorugh related to diabetes #Secondary hyperparathyroidism S: [facility]  -AV fistula 06/01/2016 by Dr. EDonnetta HutchingA/P: Patient has been stable on dialysis-no increased edema but has had weight gain-I think this may be more dietary - He remains compliant with Sensipar for secondary hyperparathyroidism -On Renvela phosphate binder-would encourage him to continue this medication  #Heart failure with preserved ejection fraction-follows with Dr. NJohnsie CancelS: Medication: None-lasix in past, now managed by dialysis   Edema: No increase Weight gain: Yes See above Shortness of breath: Not reported A/P: Once again does not appear fluid overloaded but is gaining weight-encouraged dietary modification and exercise-he does not have strong interest in making these changes  #hypertension S: medication: None BP Readings from Last 3 Encounters:  02/10/22 120/60  10/21/21 118/60  07/06/21 110/62  A/P: Blood pressure is well-controlled without medication at this time-continue to monitor  #Aortic stenosis S:Last echocardiogram august 2022-reported as mild with 1 year repeat mentioned by cardiology A/P: Stable murmur-has  upcoming cardiology follow-up   # Diabetes with neuropathy S: Medication:basaglar 50 units in the morning of from 30 units at last visit, gabapentin '300mg'$  daily (limited by dialysis) CBGs- nothing under 100, as low as 116 if avoid snacking at night but up to 180 if snacks Exercise and diet- not exercising- encouraged to do so.  -weight up 5 lbs Lab Results  Component Value Date   HGBA1C 8.0 (A) 02/10/2022   HGBA1C 9.9 08/04/2021   HGBA1C 9.5 (H) 07/06/2021  A/P: Significant improvement in A1c though I will note this was a point-of-care test.  Since he has such improvement in his blood sugars with dietary modifications if he does not snack at night and has had no lows I encouraged him to not snack at night-will hold off on other adjustments to see if he can be consistent with this-if he  cannot consider increasing Basaglar further -We will try to get him set up for CGM- I think this would give him more confidence on dietary changes or if we titrate insulin to make sure he doesn't have lows  #history of stroke- now on aspirin '81mg'$ - stutter lingers from this #hyperlipidemia S: Medication: in 2023 back on rosuvastatin 10 mg  Lab Results  Component Value Date   CHOL 147 07/06/2021   HDL 36.10 (L) 07/06/2021   LDLDIRECT 41.0 07/06/2021   TRIG (H) 07/06/2021    529.0 Triglyceride is over 400; calculations on Lipids are invalid.   CHOLHDL 4 07/06/2021   A/P: LDL at goal but triglycerides very high-hoping for dietary change and diabetes control to drive triglycerides down  # Fatty liver-noted on prior ultrasound-discussed need for healthy eating/or exercise -Also discussed risk of progression to cirrhosis which he was unfamiliar with and discussed that could even lead to liver cancer  # Stuffy nose at times-he wants me to send in Afrin and I discussed this could raise blood pressure-recommended trial of Flonase instead and he agrees  Recommended follow up: Return in about 4 months (around  06/11/2022) for followup or sooner if needed.Schedule b4 you leave. Future Appointments  Date Time Provider [place]  02/22/2022  9:15 AM Josue Hector, MD [facility]  02/24/2022  1:00 PM [facility] 5 [facility]  02/24/2022  1:30 PM [facility]  04/29/2022  3:15 PM [facility] HEALTH COACH [facility]  06/07/2022  3:20 PM Nonnie Pickney, Brayton Mars, MD [facility]    Lab/Order associations:   ICD-10-CM   1. Diabetes mellitus, type II, insulin dependent (HCC)  E11.9 POCT HgB A1C   Z79.4     2. ESRD (end stage renal disease) on dialysis ([facility]) Chronic N18.6    Z99.2     3. Chronic diastolic CHF (congestive heart failure) (H[facility]) Chronic I50.32     4. Type 2 diabetes mellitus with diabetic neuropathy, with long-term current use of insulin (H[facility]) Chronic E11.40    Z79.4     5. Secondary hyperparathyroidism of renal origin ([place]) Chronic N25.81     6. Essential hypertension  I10     7. Elevated LFTs  R79.89     8. Fatty liver  K76.0       Meds ordered this encounter  Medications   gabapentin (NEURONTIN) 100 MG capsule    Sig: Take 1 capsule (100 mg total) by mouth 3 (three) times daily. Substitute for '300mg'$  once a day- patient prefers to space out dose    Dispense:  90 capsule    Refill:  5   fluticasone (FLONASE) 50 MCG/ACT nasal spray    Sig: Place 2 sprays into both nostrils daily.    Dispense:  16 g    Refill:  3    Time Spent: 41 minutes of total time (4:29 PM-5:10 PM) was spent on the date of the encounter performing the following actions: chart review prior to seeing the patient, obtaining history, performing a medically necessary exam, counseling on the treatment plan, placing orders, and documenting in our EHR.    Return precautions advised.  Garret Reddish, MD

## 2022-02-11 ENCOUNTER — Encounter: Payer: Self-pay | Admitting: Family Medicine

## 2022-02-14 ENCOUNTER — Other Ambulatory Visit: Payer: Self-pay | Admitting: *Deleted

## 2022-02-14 DIAGNOSIS — I739 Peripheral vascular disease, unspecified: Secondary | ICD-10-CM

## 2022-02-14 NOTE — Progress Notes (Signed)
CARDIOLOGY OFFICE NOTE  Date:  02/22/2022    [name] Date of Birth: 09-01-1960 Medical Record #[id number]  PCP:  Marin Olp, MD  Cardiologist:  [name]  No chief complaint on file.    History of Present Illness: Edgar Brewer is a 62 y.o. male who presents today for f/u diastolic CHF and mild AS He has CRF with Cr 7.4 Now on dialysis  Has left brachio cephalic fistula. Also with anemia, DM, neuropathy and LE Varicosities. History of anemia due to CRF EGD 04/05/16 duodenitis and colon with multiple polyps removed and Diverticular disease.   TTE 08/28/20 EF normal mean AV gradient only 9.5 mmhg   He is a big Electrical engineer to talk sports and thinks [place] Blood stinks   10/04/18 Had laparoscopic cholecystectomy Re admitted with ? Seroma vs biloma or abscess started on antibiotics HIDA negative for bile leak but outpatient still with elevated alk phos and ESR 97 Seeing Dr Silverio Decamp Readmitted for percutaneous drainage and more antibiotic Rx October 2020  05/22/20 admitted with dyspnea and ? LLE cellulitis Troponin 18 BNP 945 Dialysis intensified Venous duplex Negative for DVT CXR small left pleural effusion Cellulitis RX with Augmentin and Doxycycline Cardiology did  Not see patient during hospitalization   Very poor vision doesn't drive and has hard time reading Recent floaters worse   Lives with mother and brother who drive him around Does not like to exercise   Past Medical History:  Diagnosis Date   Acute on chronic combined systolic and diastolic CHF, NYHA class 1 ([place]) 01/13/2015   Allergy    Arthritis    Chronic kidney disease    MWF [name].   Diabetes mellitus type 2 with complications ([place])    Previous insulin, non-insulin requiring noted 09/2018.  Complications include retinopathy, peripheral neuropathy.   GERD (gastroesophageal reflux disease)    at times   Head injury with loss of consciousness ([place])    History of kidney stones    Hypertension     Neuromuscular disorder ([place])    Neuropathy    Pulmonary hypertension ([place])    PA peak pressure 41 mmHg 03/04/16 echo   Stroke [facility])    no residual weakness   Varicose veins of lower extremities with complications 94/70/9628    Past Surgical History:  Procedure Laterality Date   AV FISTULA PLACEMENT Left 06/01/2016   Procedure: ARTERIOVENOUS (AV) FISTULA CREATION;  Surgeon: [name], MD;  Location: [place];  Service: Vascular;  Laterality: Left;   AV FISTULA PLACEMENT Left 08/10/2017   Procedure: BRACHIOCEPHALIC ARTERIOVENOUS (AV) FISTULA CREATION LEFT UPPER EXTREMITY;  Surgeon: [name], MD;  Location: [place];  Service: Vascular;  Laterality: Left;   CHOLECYSTECTOMY N/A 10/04/2018   Procedure: LAPAROSCOPIC CHOLECYSTECTOMY;  Surgeon: Mickeal Skinner, MD;  Location: [place];  Service: General;  Laterality: N/A;   COLONOSCOPY W/ POLYPECTOMY  03/2016   For evaluation of iron deficiency anemia.  Dr. Silverio Decamp.  10 polyps, largest 20 mm.  Pathology: tubular adenomas with no high-grade dysplasia, hyperplastic.  None bleeding internal hemorrhoids.  Pandiverticulosis.   CYSTOSCOPY WITH RETROGRADE PYELOGRAM, URETEROSCOPY AND STENT PLACEMENT Bilateral 02/25/2021   Procedure: CYSTOSCOPY WITH BILATERAL  RETROGRADE PYELOGRAM, RIGHT DIAGNOSTIC URETEROSCOPY,  BLADDER BIOPSY WITH FULGERATION;  Surgeon: Alexis Frock, MD;  Location: [facility];  Service: Urology;  Laterality: Bilateral;   ENDOVENOUS ABLATION SAPHENOUS VEIN W/ LASER Left 07-23-2015   endovenous laser ablation left greater saphenous vein by [name]  ENDOVENOUS ABLATION SAPHENOUS VEIN W/ LASER Right 10/15/2015   EVLA right greater saphenous vein by [name]   ESOPHAGOGASTRODUODENOSCOPY  03/2016   For evaluation of IDA.  Dr. Silverio Decamp.  Duodenal erythema, pathology benign mucosa, no villous atrophy.   GALLBLADDER SURGERY  10/2018   HOLMIUM LASER APPLICATION Right 10/01/3530   Procedure: HOLMIUM LASER OF TUMOR;  Surgeon:  Alexis Frock, MD;  Location: [facility];  Service: Urology;  Laterality: Right;   IR RADIOLOGIST EVAL & MGMT  11/29/2018   TEE WITHOUT CARDIOVERSION N/A 11/19/2018   Procedure: TRANSESOPHAGEAL ECHOCARDIOGRAM (TEE);  Surgeon: Acie Fredrickson Wonda Cheng, MD;  Location: [facility];  Service: Cardiovascular;  Laterality: N/A;     Medications: Current Outpatient Medications  Medication Sig Dispense Refill   aspirin EC 81 MG tablet Take 81 mg by mouth daily. Swallow whole.     b complex-vitamin c-folic acid (NEPHRO-VITE) 0.8 MG TABS tablet Take 1 tablet by mouth in the morning.     cinacalcet (SENSIPAR) 60 MG tablet Take 60 mg by mouth every Tuesday, Thursday, and Saturday at 6 PM. In the evening.     fluticasone (FLONASE) 50 MCG/ACT nasal spray Place 2 sprays into both nostrils daily. 16 g 3   gabapentin (NEURONTIN) 100 MG capsule Take 1 capsule (100 mg total) by mouth 3 (three) times daily. Substitute for '300mg'$  once a day- patient prefers to space out dose 90 capsule 5   Insulin Glargine (BASAGLAR KWIKPEN) 100 UNIT/ML Inject 50 Units into the skin daily.     Insulin Pen Needle 31G X 8 MM MISC Use to inject insulin into the skin daily. Dx: E11.9 100 each 3   omeprazole (PRILOSEC) 20 MG capsule Take 1 capsule by mouth once daily 90 capsule 0   rosuvastatin (CRESTOR) 10 MG tablet Take 1 tablet (10 mg total) by mouth daily. 90 tablet 3   sevelamer carbonate (RENVELA) 800 MG tablet Take 2 tablets (1,600 mg total) by mouth 3 (three) times daily with meals.     Insulin Glargine (BASAGLAR KWIKPEN) 100 UNIT/ML Inject 30-40 Units into the skin daily. Please provide pen needles (Patient not taking: Reported on 02/22/2022) 15 mL 5   No current facility-administered medications for this visit.    Allergies: Allergies  Allergen Reactions   Codeine Anaphylaxis, Hives, Swelling and Other (See Comments)    Swelling all over body and the throat   Clindamycin/Lincomycin Nausea And Vomiting    Social History: The  patient  reports that he quit smoking about 7 years ago. His smoking use included cigarettes. He has a 45.00 pack-year smoking history. He has never used smokeless tobacco. He reports current alcohol use of about 1.0 standard drink of alcohol per week. He reports that he does not use drugs.   Family History: The patient's family history includes Arthritis in his mother; Hearing loss in his father; Heart disease in his maternal grandmother; Hyperlipidemia in his maternal grandmother; Lung cancer in his maternal grandfather; Prostate cancer in his father.   Review of Systems: Please see the history of present illness.   Otherwise, the review of systems is positive for none.   All other systems are reviewed and negative.   Physical Exam: VS:  BP 122/62   Pulse 89   Ht '5\' 8"'$  (1.727 m)   Wt 216 lb (98 kg)   SpO2 93%   BMI 32.84 kg/m  .  BMI Body mass index is 32.84 kg/m.  Wt Readings from Last 3 Encounters:  02/22/22 216  lb (98 kg)  02/10/22 220 lb (99.8 kg)  10/21/21 215 lb (97.5 kg)    Affect appropriate Healthy:  appears stated age 71: normal Neck supple with no adenopathy JVP normal no bruits no thyromegaly Lungs clear with no wheezing and good diaphragmatic motion Heart:  S1/S2 preserved mild AS murmur, no rub, gallop or click PMI normal Abdomen: benighn, BS positve, no tenderness, no AAA no bruit.  No HSM or HJR Large left upper arm fistula with thrill  No edema Neuro non-focal Skin warm and dry No muscular weakness   LABORATORY DATA:  EKG:   02/22/2022 NSR with nonspecific T wave changes. PR 212 msec    Lab Results  Component Value Date   WBC 6.8 07/06/2021   HGB 13.2 07/06/2021   HCT 38.4 (L) 07/06/2021   PLT 163.0 07/06/2021   GLUCOSE 386 (H) 07/06/2021   CHOL 147 07/06/2021   TRIG (H) 07/06/2021    529.0 Triglyceride is over 400; calculations on Lipids are invalid.   HDL 36.10 (L) 07/06/2021   LDLDIRECT 41.0 07/06/2021   ALT 31 07/06/2021   AST 20  07/06/2021   NA 133 (L) 07/06/2021   K 5.2 (H) 07/06/2021   CL 90 (L) 07/06/2021   CREATININE 8.03 (HH) 07/06/2021   BUN 41 (H) 07/06/2021   CO2 28 07/06/2021   TSH 1.97 12/10/2020   PSA 4.22 (H) 07/06/2021   INR 1.0 11/14/2018   HGBA1C 8.0 (A) 02/10/2022    BNP (last 3 results) No results for input(s): "BNP" in the last 8760 hours.   ProBNP (last 3 results) No results for input(s): "PROBNP" in the last 8760 hours.   Other Studies Reviewed Today:  Myoview Study Highlights from 05/2014   Myocardial perfusion is normal. The study is abormal. This is an intermediate risk study. Overall left ventricular systolic function was abnormal. LV cavity size is normal. The left ventricular ejection fraction is moderately decreased (42%). There is no prior study for comparison.   Notes Recorded by Josue Hector, MD on 06/25/2014 at 8:31 AM Increase cozaar to 100 mg decrease lasix to once/day  F/u with me to discuss cath EF low   Echo Study Conclusions from 05/2014   - Left ventricle: The cavity size was mildly dilated. Wall   thickness was increased in a pattern of mild LVH. Systolic   function was mildly reduced. The estimated ejection fraction was   in the range of 45% to 50%. Diffuse hypokinesis. Doppler   parameters are consistent with elevated ventricular end-diastolic   filling pressure. - Mitral valve: There was mild regurgitation. - Left atrium: The atrium was mildly dilated. - Atrial septum: No defect or patent foramen ovale was identified. - Pulmonary arteries: PA peak pressure: 52 mm Hg (S). - Pericardium, extracardiac: Small to moderate circumferential   pericardial effusion no tamponade.    Assessment/Plan: 1. Anemia:  Multiple polyps removed f/u GI and CKD f/u primary   2. Chronic diastolic HF -  related to CRF on dialysis Hospitalization April 2022 improved with dialysis   Myovue done 08/25/20 non ischemic EF 54%  3. Varicose veins/venous insufficiency - chronic.  Refer back to Dr Early ? Sclerosing/ablative Rx  4. CKD -   On dialysis  AV fistula done 06/01/16 by Dr Early F/U Dr Lorrene Reid   5. DM - Discussed low carb diet.  Target hemoglobin A1c is 6.5 or less.  Continue current medications. A1c in September poor control 8.4   6. Prior tobacco abuse -  tells me he is not smoking CT 11/15/18 no cancer  7. Aortic Stenosis :  Very mild considering fistula with mean gradient  stable 9.2 on TTE 08/27/20   8. GI:  F/U Dr Silverio Decamp post cholecystectomy with rehospitalization for elevated alk phos  And percutaneous aspiration of gallbladder fossa Rx Klebsiella, Ecoli and Enterococcus bacteremia   9.  CAD:  normal myovue 08/25/20 no ischemia EF 54%     F/U in a year   Jenkins Rouge

## 2022-02-22 ENCOUNTER — Encounter: Payer: Self-pay | Admitting: Cardiovascular Disease

## 2022-02-22 ENCOUNTER — Ambulatory Visit: Payer: Medicare (Managed Care) | Attending: Cardiovascular Disease | Admitting: Cardiovascular Disease

## 2022-02-22 ENCOUNTER — Encounter: Payer: Self-pay | Admitting: *Deleted

## 2022-02-22 VITALS — BP 122/62 | HR 89 | Ht 68.0 in | Wt 216.0 lb

## 2022-02-22 DIAGNOSIS — I5032 Chronic diastolic (congestive) heart failure: Secondary | ICD-10-CM

## 2022-02-22 DIAGNOSIS — I350 Nonrheumatic aortic (valve) stenosis: Secondary | ICD-10-CM | POA: Diagnosis not present

## 2022-02-22 DIAGNOSIS — N185 Chronic kidney disease, stage 5: Secondary | ICD-10-CM | POA: Diagnosis not present

## 2022-02-22 NOTE — Telephone Encounter (Signed)
This encounter was created in error - please disregard.

## 2022-02-22 NOTE — Patient Instructions (Addendum)
Medication Instructions:  Your physician recommends that you continue on your current medications as directed. Please refer to the Current Medication list given to you today.  *If you need a refill on your cardiac medications before your next appointment, please call your pharmacy*  Lab Work: If you have labs (blood work) drawn today and your tests are completely normal, you will receive your results only by: MyChart Message (if you have MyChart) OR A paper copy in the mail If you have any lab test that is abnormal or we need to change your treatment, we will call you to review the results.  Testing/Procedures: None ordered today.  Follow-Up: At [facility], you and your health needs are our priority.  As part of our continuing mission to provide you with exceptional heart care, we have created designated Provider Care Teams.  These Care Teams include your primary Cardiologist (physician) and Advanced Practice Providers (APPs -  Physician Assistants and Nurse Practitioners) who all work together to provide you with the care you need, when you need it.  We recommend signing up for the patient portal called "MyChart".  Sign up information is provided on this After Visit Summary.  MyChart is used to connect with patients for Virtual Visits (Telemedicine).  Patients are able to view lab/test results, encounter notes, upcoming appointments, etc.  Non-urgent messages can be sent to your provider as well.   To learn more about what you can do with MyChart, go to [email or web address].    Your next appointment:   6 month(s)  Provider:   Peter Nishan, MD      

## 2022-02-24 ENCOUNTER — Ambulatory Visit (HOSPITAL_COMMUNITY): Payer: Medicare (Managed Care)

## 2022-02-24 ENCOUNTER — Ambulatory Visit: Payer: Medicare (Managed Care)

## 2022-03-17 ENCOUNTER — Ambulatory Visit (HOSPITAL_COMMUNITY)
Admission: RE | Admit: 2022-03-17 | Discharge: 2022-03-17 | Disposition: A | Discharge status: Home / Self Care | Payer: Medicare (Managed Care) | Source: Ambulatory Visit | Attending: Physician Assistant | Admitting: Physician Assistant

## 2022-03-17 ENCOUNTER — Ambulatory Visit (INDEPENDENT_AMBULATORY_CARE_PROVIDER_SITE_OTHER): Payer: Medicare (Managed Care) | Admitting: Physician Assistant

## 2022-03-17 ENCOUNTER — Telehealth: Payer: Self-pay | Admitting: *Deleted

## 2022-03-17 VITALS — BP 135/73 | HR 81 | Temp 98.0°F | Resp 20 | Ht 68.0 in | Wt 218.2 lb

## 2022-03-17 DIAGNOSIS — I739 Peripheral vascular disease, unspecified: Secondary | ICD-10-CM

## 2022-03-17 LAB — VAS US ABI WITH/WO TBI

## 2022-03-17 NOTE — Progress Notes (Signed)
Office Note   History of Present Illness   Edgar Brewer is a 62 y.o. ([birth year]) male who presents for surveillance of PAD.  He was last seen by our office in 2022 with atypical symptoms of PAD.  He typically reported "stiffness" in both of his knees after walking for an extended period of time.  Plain films of his knees demonstrate abundant calcification in his tibial vessels.  He also has a history of diabetic neuropathy in both of his arms and legs.  At follow-up today, he is doing okay.  He denies any changes to his health since the last time he had seen him.  He states that he still gets "stiff" in his knees after walking.  He denies any rest pain in his feet, however he does get occasional stabbing pains in his feet due to diabetic neuropathy.  The stabbing pains are typically resolved by movement of his feet or rubbing his feet.  He denies any wounds of his lower extremities.  He denies any classic history of claudication.  Current Outpatient Medications  Medication Sig Dispense Refill   aspirin EC 81 MG tablet Take 81 mg by mouth daily. Swallow whole.     b complex-vitamin c-folic acid (NEPHRO-VITE) 0.8 MG TABS tablet Take 1 tablet by mouth in the morning.     cinacalcet (SENSIPAR) 60 MG tablet Take 60 mg by mouth every Tuesday, Thursday, and Saturday at 6 PM. In the evening.     fluticasone (FLONASE) 50 MCG/ACT nasal spray Place 2 sprays into both nostrils daily. 16 g 3   gabapentin (NEURONTIN) 100 MG capsule Take 1 capsule (100 mg total) by mouth 3 (three) times daily. Substitute for 386m once a day- patient prefers to space out dose 90 capsule 5   Insulin Glargine (BASAGLAR KWIKPEN) 100 UNIT/ML Inject 30-40 Units into the skin daily. Please provide pen needles 15 mL 5   Insulin Glargine (BASAGLAR KWIKPEN) 100 UNIT/ML Inject 50 Units into the skin daily.     Insulin Pen Needle 31G X 8 MM MISC Use to inject insulin into the skin daily. Dx: E11.9 100 each 3   lanthanum (FOSRENOL)  1000 MG chewable tablet Chew by mouth.     omeprazole (PRILOSEC) 20 MG capsule Take 1 capsule by mouth once daily 90 capsule 0   rosuvastatin (CRESTOR) 10 MG tablet Take 1 tablet (10 mg total) by mouth daily. 90 tablet 3   sevelamer carbonate (RENVELA) 800 MG tablet Take 2 tablets (1,600 mg total) by mouth 3 (three) times daily with meals.     No current facility-administered medications for this visit.    REVIEW OF SYSTEMS (negative unless checked):   Cardiac:  []$  Chest pain or chest pressure? []$  Shortness of breath upon activity? []$  Shortness of breath when lying flat? []$  Irregular heart rhythm?  Vascular:  []$  Pain in calf, thigh, or hip brought on by walking? []$  Pain in feet at night that wakes you up from your sleep? []$  Blood clot in your veins? []$  Leg swelling?  Pulmonary:  []$  Oxygen at home? []$  Productive cough? []$  Wheezing?  Neurologic:  []$  Sudden weakness in arms or legs? []$  Sudden numbness in arms or legs? []$  Sudden onset of difficult speaking or slurred speech? []$  Temporary loss of vision in one eye? []$  Problems with dizziness?  Gastrointestinal:  []$  Blood in stool? []$  Vomited blood?  Genitourinary:  []$  Burning when urinating? []$  Blood in urine?  Psychiatric:  []$  Major depression  Hematologic:  []$  Bleeding problems? []$  Problems with blood clotting?  Dermatologic:  []$  Rashes or ulcers?  Constitutional:  []$  Fever or chills?  Ear/Nose/Throat:  []$  Change in hearing? []$  Nose bleeds? []$  Sore throat?  Musculoskeletal:  []$  Back pain? []$  Joint pain? []$  Muscle pain?   Physical Examination   Vitals:   03/17/22 1332  BP: 135/73  Pulse: 81  Resp: 20  Temp: 98 F (36.7 C)  TempSrc: Temporal  SpO2: 98%  Weight: 218 lb 3.2 oz (99 kg)  Height: 5' 8"$  (1.727 m)   Body mass index is 33.18 kg/m.  General:  WDWN in NAD; vital signs documented above Gait: Not observed HENT: WNL, normocephalic Pulmonary: normal non-labored breathing  Cardiac:  regular rate and rhythm Abdomen: soft, NT, no masses Skin: without rashes Vascular Exam/Pulses: Nonpalpable pedal pulses.  Biphasic right PT/DP Doppler signals.  Monophasic left PT and biphasic left DP Doppler signals. Extremities: without ischemic changes, without Gangrene , without cellulitis; without open wounds;  Musculoskeletal: no muscle wasting or atrophy  Neurologic: A&O X 3;  No focal weakness or paresthesias are detected Psychiatric:  The pt has Normal affect.  Non-Invasive Vascular imaging   ABI (03/17/2022) R:  ABI: [place] ([place]),  PT: bi DP: bi TBI:  0.61 L:  ABI: [place] ([place]),  PT: mono DP: bi TBI: 0.58  Medical Decision Making   Edgar Brewer is a 62 y.o. male who presents with surveillance of PAD  Based on the patient's vascular studies, his tibial vessels are still noncompressible.  His right TBI is 0.61 and left TBI is 0.58.  He has biphasic flow in his right PT/DP arteries.  He has monophasic flow in his left PT and biphasic flow in his left DP. He still endorses the same history of "stiff" knees after walking for extended period of time there is no clear history of claudication. He denies any rest pain or lower extremity wounds.  He does have diabetic neuropathy in both of his feet causing occasional pains. He will continue his aspirin and statin He can follow-up with our office in 1 year with repeat ABIs   Vicente Serene PA-C [facility] Office: [facility] MD: Scot Dock

## 2022-03-17 NOTE — Telephone Encounter (Signed)
   Patient Name: Edgar Brewer  DOB: 1960/06/10 MRN: [id number]  Primary Cardiologist: Jenkins Rouge, MD  Chart reviewed as part of pre-operative protocol coverage.   From a cardiac perspective, pt may hold Aspirin for 5-7 days prior to surgery. However, pt also takes Aspirin for a history of stroke. Additional recommendations for holding Aspirin prior to surgery should come from managing provider (PCP).   I will route this recommendation to the requesting party via Epic fax function and remove from pre-op pool.  Please call with questions.  Lenna Sciara, NP 03/17/2022, 3:59 PM

## 2022-03-17 NOTE — Telephone Encounter (Signed)
   Pre-operative Risk Assessment    Patient Name: Edgar Brewer  DOB: October 03, 1960 MRN: [id number]      Request for Surgical Clearance    Procedure:   RETINAL SURGERY : VITRECTOMY, PANRETINAL PHOTOCOAGULATION, INTRAVITREAL INJECTION OF EYLEA  Date of Surgery:  Clearance 03/31/22                                 Surgeon:  DR. Corliss Parish, MD Surgeon's Group or Practice Name:  [facility], [facility] Phone number:  [phone] Fax number:  [phone]   Type of Clearance Requested:   - Pharmacy:  Hold Aspirin X'S 7 DAYS   Type of Anesthesia:  MAC   Additional requests/questions:    [name]   03/17/2022, 3:42 PM

## 2022-04-08 ENCOUNTER — Other Ambulatory Visit: Payer: Self-pay | Admitting: Family Medicine

## 2022-04-12 ENCOUNTER — Telehealth: Payer: Self-pay | Admitting: Family Medicine

## 2022-04-12 NOTE — Telephone Encounter (Signed)
Contacted [name] to schedule their annual wellness visit. Appointment made for 04/19/2022.  [place] Direct Dial [phone]

## 2022-04-19 ENCOUNTER — Ambulatory Visit (INDEPENDENT_AMBULATORY_CARE_PROVIDER_SITE_OTHER): Payer: Medicare (Managed Care)

## 2022-04-19 VITALS — Wt 218.0 lb

## 2022-04-19 DIAGNOSIS — Z0000 Encounter for general adult medical examination without abnormal findings: Secondary | ICD-10-CM | POA: Diagnosis not present

## 2022-04-19 NOTE — Progress Notes (Signed)
I connected with  [name] on 04/19/22 by a audio enabled telemedicine application and verified that I am speaking with the correct person using two identifiers.  Patient Location: Home  Provider Location: Office/Clinic  I discussed the limitations of evaluation and management by telemedicine. The patient expressed understanding and agreed to proceed.   Subjective:   Edgar Brewer is a 62 y.o. male who presents for Medicare Annual/Subsequent preventive examination.  Review of Systems     Cardiac Risk Factors include: advanced age (>84men, >61 women);male gender;hypertension;obesity (BMI >30kg/m2)     Objective:    Today's Vitals   04/19/22 1451  Weight: 218 lb (98.9 kg)   Body mass index is 33.15 kg/m.     04/15/2021    3:02 PM 02/19/2021    9:09 AM 01/29/2021   11:25 AM 05/17/2020    1:28 PM 11/14/2018    4:59 PM 10/19/2018    4:19 AM 10/03/2018    4:05 AM  Advanced Directives  Does Patient Have a Medical Advance Directive? No No No No No No No  Would patient like information on creating a medical advance directive? No - Patient declined No - Patient declined  No - Patient declined No - Patient declined No - Patient declined No - Patient declined    Current Medications (verified) Outpatient Encounter Medications as of 04/19/2022  Medication Sig   aspirin EC 81 MG tablet Take 81 mg by mouth daily. Swallow whole.   b complex-vitamin c-folic acid (NEPHRO-VITE) 0.8 MG TABS tablet Take 1 tablet by mouth in the morning.   cinacalcet (SENSIPAR) 60 MG tablet Take 60 mg by mouth every Tuesday, Thursday, and Saturday at 6 PM. In the evening.   fluticasone (FLONASE) 50 MCG/ACT nasal spray Place 2 sprays into both nostrils daily.   gabapentin (NEURONTIN) 100 MG capsule Take 1 capsule (100 mg total) by mouth 3 (three) times daily. Substitute for 300mg  once a day- patient prefers to space out dose   Insulin Glargine (BASAGLAR KWIKPEN) 100 UNIT/ML Inject 50 Units into the skin daily.    Insulin Pen Needle 31G X 8 MM MISC Use to inject insulin into the skin daily. Dx: E11.9   lanthanum (FOSRENOL) 1000 MG chewable tablet Chew by mouth.   omeprazole (PRILOSEC) 20 MG capsule Take 1 capsule by mouth once daily   prednisoLONE acetate (PRED FORTE) 1 % ophthalmic suspension Place 1 drop into the right eye 4 (four) times daily.   rosuvastatin (CRESTOR) 10 MG tablet Take 1 tablet (10 mg total) by mouth daily.   sevelamer carbonate (RENVELA) 800 MG tablet Take 2 tablets (1,600 mg total) by mouth 3 (three) times daily with meals.   Insulin Glargine (BASAGLAR KWIKPEN) 100 UNIT/ML Inject 30-40 Units into the skin daily. Please provide pen needles (Patient not taking: Reported on 04/19/2022)   No facility-administered encounter medications on file as of 04/19/2022.    Allergies (verified) Codeine and Clindamycin/lincomycin   History: Past Medical History:  Diagnosis Date   Acute on chronic combined systolic and diastolic CHF, NYHA class 1 ([place]) 01/13/2015   Allergy    Arthritis    Chronic kidney disease    MWF [name].   Diabetes mellitus type 2 with complications ([place])    Previous insulin, non-insulin requiring noted 09/2018.  Complications include retinopathy, peripheral neuropathy.   GERD (gastroesophageal reflux disease)    at times   Head injury with loss of consciousness ([place])    History of kidney stones    Hypertension  Neuromuscular disorder ([place])    Neuropathy    Pulmonary hypertension (H[facility])    PA peak pressure 41 mmHg 03/04/16 echo   Stroke [facility])    no residual weakness   Varicose veins of lower extremities with complications [id number]   Past Surgical History:  Procedure Laterality Date   AV FISTULA PLACEMENT Left 06/01/2016   Procedure: ARTERIOVENOUS (AV) FISTULA CREATION;  Surgeon: [name], MD;  Location: [place] [facility];  Service: Vascular;  Laterality: Left;   AV FISTULA PLACEMENT Left 08/10/2017   Procedure: BRACHIOCEPHALIC ARTERIOVENOUS (AV) FISTULA CREATION LEFT  UPPER EXTREMITY;  Surgeon: [name], MD;  Location: [place];  Service: Vascular;  Laterality: Left;   CHOLECYSTECTOMY N/A 10/04/2018   Procedure: LAPAROSCOPIC CHOLECYSTECTOMY;  Surgeon: Mickeal Skinner, MD;  Location: [place];  Service: General;  Laterality: N/A;   COLONOSCOPY W/ POLYPECTOMY  03/2016   For evaluation of iron deficiency anemia.  Dr. Silverio Decamp.  10 polyps, largest 20 mm.  Pathology: tubular adenomas with no high-grade dysplasia, hyperplastic.  None bleeding internal hemorrhoids.  Pandiverticulosis.   CYSTOSCOPY WITH RETROGRADE PYELOGRAM, URETEROSCOPY AND STENT PLACEMENT Bilateral 02/25/2021   Procedure: CYSTOSCOPY WITH BILATERAL  RETROGRADE PYELOGRAM, RIGHT DIAGNOSTIC URETEROSCOPY,  BLADDER BIOPSY WITH FULGERATION;  Surgeon: Alexis Frock, MD;  Location: [facility];  Service: Urology;  Laterality: Bilateral;   ENDOVENOUS ABLATION SAPHENOUS VEIN W/ LASER Left 07-23-2015   endovenous laser ablation left greater saphenous vein by [name]   ENDOVENOUS ABLATION SAPHENOUS VEIN W/ LASER Right 10/15/2015   EVLA right greater saphenous vein by [name]   ESOPHAGOGASTRODUODENOSCOPY  03/2016   For evaluation of IDA.  Dr. Silverio Decamp.  Duodenal erythema, pathology benign mucosa, no villous atrophy.   GALLBLADDER SURGERY  10/2018   HOLMIUM LASER APPLICATION Right [id number]   Procedure: HOLMIUM LASER OF TUMOR;  Surgeon: Alexis Frock, MD;  Location: [facility];  Service: Urology;  Laterality: Right;   IR RADIOLOGIST EVAL & MGMT  11/29/2018   TEE WITHOUT CARDIOVERSION N/A 11/19/2018   Procedure: TRANSESOPHAGEAL ECHOCARDIOGRAM (TEE);  Surgeon: Acie Fredrickson, Wonda Cheng, MD;  [facility] END[facility];  Service: Cardiovascular;  Laterality: N/A;   Family History  Problem Relation Age of Onset   Arthritis Mother    Hearing loss Father    Prostate cancer Father    Hyperlipidemia Maternal Grandmother    Heart disease Maternal Grandmother    Lung cancer Maternal Grandfather    Social History    Socioeconomic History   Marital status: Single    Spouse name: Not on file   Number of children: Not on file   Years of education: Not on file   Highest education level: Not on file  Occupational History   Not on file  Tobacco Use   Smoking status: Former    Packs/day: 1.00    Years: 45.00    Additional pack years: 0.00    Total pack years: 45.00    Types: Cigarettes    Quit date: 01/13/2015    Years since quitting: 7.2   Smokeless tobacco: Never  Vaping Use   Vaping Use: Never used  Substance and Sexual Activity   Alcohol use: Yes    Alcohol/week: 1.0 standard drink of alcohol    Types: 1 Glasses of wine per week    Comment: rare occassion   Drug use: No   Sexual activity: Not Currently  Other Topics Concern   Not on file  Social History Narrative   Separated for many years over 15 years/practical  divorce. No Kids. No pets.  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      Hobbies: play cards, play poker, bet on horses   Social Determinants of Health   Financial Resource Strain: Low Risk  (04/19/2022)   Overall Financial Resource Strain (CARDIA)    Difficulty of Paying Living Expenses: Not hard at all  Food Insecurity: No Food Insecurity (04/19/2022)   Hunger Vital Sign    Worried About Running Out of Food in the Last Year: Never true    [place] in the Last Year: Never true  Transportation Needs: No Transportation Needs (04/19/2022)   PRAPARE - Hydrologist (Medical): No    Lack of Transportation (Non-Medical): No  Physical Activity: Inactive (04/19/2022)   Exercise Vital Sign    Days of Exercise per Week: 0 days    Minutes of Exercise per Session: 0 min  Stress: No Stress Concern Present (04/19/2022)   [place]    Feeling of Stress : Not at all  Social Connections:  Unknown (04/19/2022)   Social Connection and Isolation Panel [NHANES]    Frequency of Communication with Friends and Family: Not on file    Frequency of Social Gatherings with Friends and Family: Twice a week    Attends Religious Services: More than 4 times per year    Active Member of [employer] or Organizations: No    Attends Music therapist: Never    Marital Status: Separated    Tobacco Counseling Counseling given: Not Answered   Clinical Intake:  Pre-visit preparation completed: Yes  Pain : No/denies pain     BMI - recorded: 33.15 Nutritional Status: BMI > 30  Obese Nutritional Risks: None Diabetes: Yes CBG done?: Yes (218 per pt) CBG resulted in Enter/ Edit results?: No Did pt. bring in CBG monitor from home?: No  How often do you need to have someone help you when you read instructions, pamphlets, or other written materials from your doctor or pharmacy?: 1 - Never  Diabetic?Nutrition Risk Assessment:  Has the patient had any N/V/D within the last 2 months?  No  Does the patient have any non-healing wounds?  No  Has the patient had any unintentional weight loss or weight gain?  No   Diabetes:  Is the patient diabetic?  Yes  If diabetic, was a CBG obtained today?  Yes  Did the patient bring in their glucometer from home?  No  How often do you monitor your CBG's? daily.   Financial Strains and Diabetes Management:  Are you having any financial strains with the device, your supplies or your medication? No .  Does the patient want to be seen by Chronic Care Management for management of their diabetes?  No  Would the patient like to be referred to a Nutritionist or for Diabetic Management?  No   Diabetic Exams:  Diabetic Eye Exam: Overdue for diabetic eye exam. Pt has been advised about the importance in completing this exam. Patient advised to call and schedule an eye exam. Diabetic Foot Exam: Completed 07/06/21   Interpreter Needed?: No  Information  entered by :: Charlott Rakes, LPN   Activities of Daily Living    04/19/2022    2:58 PM  In your present state of health, do you have any difficulty  performing the following activities:  Hearing? 0  Vision? 0  Difficulty concentrating or making decisions? 0  Walking or climbing stairs? 0  Dressing or bathing? 0  Doing errands, shopping? 0  Preparing Food and eating ? Y  Comment mom prepared meals  Using the Toilet? N  In the past six months, have you accidently leaked urine? N  Do you have problems with loss of bowel control? N  Managing your Medications? N  Managing your Finances? N  Housekeeping or managing your Housekeeping? N    Patient Care Team: Marin Olp, MD as PCP - General (Family Medicine) Josue Hector, MD as PCP - Cardiology (Cardiology) Jamal Maes, MD as Consulting Physician (Nephrology) Sherlynn Stalls, MD as Consulting Physician (Ophthalmology) [facility]  Indicate any recent Medical Services you may have received from other than [facility] providers in the past year (date may be approximate).     Assessment:   This is a routine wellness examination for Tytan.  Hearing/Vision screen Hearing Screening - Comments:: Pt denies any hearing issues  Vision Screening - Comments:: Pt follows up with Dr Sherlynn Stalls for annual eye exams   Dietary issues and exercise activities discussed: Current Exercise Habits: The patient does not participate in regular exercise at present   Goals Addressed             This Visit's Progress    Patient Stated       None at this time        Depression Screen    04/19/2022    2:56 PM 04/15/2021    3:00 PM 09/08/2020    3:37 PM 06/04/2020   10:00 AM 02/25/2020    9:36 AM  PHQ 2/9 Scores  PHQ - 2 Score 0 0 0 0 0    Fall Risk    04/19/2022    2:57 PM 04/15/2021    3:04 PM 09/08/2020    3:37 PM 02/25/2020    9:36 AM  [place] in the past year? 0 0 0 1  Number falls in past  yr: 0 0 0 0  Injury with Fall? 0 0 0 1  Risk for fall due to : Impaired vision;Impaired balance/gait Impaired vision;Impaired balance/gait;Impaired mobility No Fall Risks   Follow up Falls prevention discussed Falls prevention discussed Falls evaluation completed     FALL RISK PREVENTION PERTAINING TO THE HOME:  Any stairs in or around the home? No  If so, are there any without handrails? No  Home free of loose throw rugs in walkways, pet beds, electrical cords, etc? Yes  Adequate lighting in your home to reduce risk of falls? Yes   ASSISTIVE DEVICES UTILIZED TO PREVENT FALLS:  Life alert? Yes  Use of a cane, walker or w/c? No  Grab bars in the bathroom? No  Shower chair or bench in shower? No  Elevated toilet seat or a handicapped toilet? No   TIMED UP AND GO:  Was the test performed? No .   Cognitive Function:    08/28/2018    2:56 PM 12/25/2017    8:12 AM  MMSE - Mini Mental State Exam  Orientation to time 5 5  Orientation to Place 5 3  Registration 3 3  Attention/ Calculation 2 5  Recall 3 2  Language- name 2 objects 2 2  Language- repeat 0 1  Language- follow 3 step command 3 3  Language- read & follow direction 1 1  Write a  sentence 1 1  Copy design 1 1  Total score 26 27        04/19/2022    3:00 PM 04/15/2021    3:07 PM  6CIT Screen  What Year? 0 points 0 points  What month? 0 points 0 points  What time? 0 points 0 points  Count back from 20 0 points 0 points  Months in reverse 0 points 0 points  Repeat phrase 0 points 0 points  Total Score 0 points 0 points    Immunizations Immunization History  Administered Date(s) Administered   Hepatitis B, ADULT 03/08/2017, 04/05/2017, 05/03/2017, 09/06/2017, 12/01/2017, 01/01/2018, 02/02/2018   Influenza,inj,Quad PF,6+ Mos 10/04/2018, 11/22/2019, 12/10/2020, 10/21/2021   Influenza,inj,quad, With Preservative 10/11/2017   Moderna Sars-Covid-2 Vaccination 04/03/2019, 05/03/2019, 01/23/2020   Pneumococcal  Conjugate-13 04/03/2017, 02/07/2018   Pneumococcal Polysaccharide-23 06/07/2017, 04/16/2018   Tdap 05/04/2014, 06/11/2015    TDAP status: Up to date  Flu Vaccine status: Up to date  Pneumococcal vaccine status: Up to date  Covid-19 vaccine status: Completed vaccines  Qualifies for Shingles Vaccine? Yes   Zostavax completed No   Shingrix Completed?: No.    Education has been provided regarding the importance of this vaccine. Patient has been advised to call insurance company to determine out of pocket expense if they have not yet received this vaccine. Advised may also receive vaccine at local pharmacy or Health Dept. Verbalized acceptance and understanding.  Screening Tests Health Maintenance  Topic Date Due   Lung Cancer Screening  11/15/2019   COVID-19 Vaccine (4 - 2023-24 season) 09/24/2021   OPHTHALMOLOGY EXAM  03/23/2022   Zoster Vaccines- Shingrix (1 of 2) 05/12/2022 (Originally 05/19/2009)   FOOT EXAM  07/07/2022   HEMOGLOBIN A1C  08/11/2022   Medicare Annual Wellness (AWV)  04/19/2023   Pneumococcal Vaccine 28-31 Years old (4 of 4 - PPSV23 or PCV20) 05/19/2024   DTaP/Tdap/Td (3 - Td or Tdap) 06/10/2025   INFLUENZA VACCINE  Completed   Hepatitis C Screening  Completed   HIV Screening  Completed   HPV VACCINES  Aged Out   COLONOSCOPY (Pts 45-9yrs Insurance coverage will need to be confirmed)  Discontinued    Health Maintenance  Health Maintenance Due  Topic Date Due   Lung Cancer Screening  11/15/2019   COVID-19 Vaccine (4 - 2023-24 season) 09/24/2021   OPHTHALMOLOGY EXAM  03/23/2022    Colorectal cancer screening: No longer required.   Lung Cancer Screening: (Low Dose CT Chest recommended if Age 20-80 years, 30 pack-year currently smoking OR have quit w/in 15years.) does qualify.   Lung Cancer Screening Referral: pt declined at this time   Additional Screening:  Hepatitis C Screening: Completed 10/20/18  Vision Screening: Recommended annual ophthalmology  exams for early detection of glaucoma and other disorders of the eye. Is the patient up to date with their annual eye exam?  No  Who is the provider or what is the name of the office in which the patient attends annual eye exams? Dr Baird Cancer  If pt is not established with a provider, would they like to be referred to a provider to establish care? No .   Dental Screening: Recommended annual dental exams for proper oral hygiene  Community Resource Referral / Chronic Care Management: CRR required this visit?  Yes   CCM required this visit?  Yes      Plan:     I have personally reviewed and noted the following in the patient's chart:   Medical and social history  Use of alcohol, tobacco or illicit drugs  Current medications and supplements including opioid prescriptions. Patient is not currently taking opioid prescriptions. Functional ability and status Nutritional status Physical activity Advanced directives List of other physicians Hospitalizations, surgeries, and ER visits in previous 12 months Vitals Screenings to include cognitive, depression, and falls Referrals and appointments  In addition, I have reviewed and discussed with patient certain preventive protocols, quality metrics, and best practice recommendations. A written personalized care plan for preventive services as well as general preventive health recommendations were provided to patient.     Willette Brace, LPN   [phone]   Nurse Notes: none

## 2022-04-19 NOTE — Patient Instructions (Signed)
[name] , Thank you for taking time to come for your Medicare Wellness Visit. I appreciate your ongoing commitment to your health goals. Please review the following plan we discussed and let me know if I can assist you in the future.   These are the goals we discussed:  Goals      Patient Stated     None at this time      Patient Stated     None at this time         This is a list of the screening recommended for you and due dates:  Health Maintenance  Topic Date Due   Screening for Lung Cancer  11/15/2019   COVID-19 Vaccine (4 - 2023-24 season) 09/24/2021   Eye exam for diabetics  03/23/2022   Zoster (Shingles) Vaccine (1 of 2) 05/12/2022*   Complete foot exam   07/07/2022   Hemoglobin A1C  08/11/2022   Medicare Annual Wellness Visit  04/19/2023   Pneumococcal Vaccination (4 of 4 - PPSV23 or PCV20) 05/19/2024   DTaP/Tdap/Td vaccine (3 - Td or Tdap) 06/10/2025   Flu Shot  Completed   Hepatitis C Screening: USPSTF Recommendation to screen - Ages 18-79 yo.  Completed   HIV Screening  Completed   HPV Vaccine  Aged Out   Colon Cancer Screening  Discontinued  *Topic was postponed. The date shown is not the original due date.    Advanced directives: Advance directive discussed with you today. Even though you declined this today please call our office should you change your mind and we can give you the proper paperwork for you to fill out.  Conditions/risks identified: none at this time   Next appointment: Follow up in one year for your annual wellness visit.   Preventive Care 36 Years and Older, Male  Preventive care refers to lifestyle choices and visits with your health care provider that can promote health and wellness. What does preventive care include? A yearly physical exam. This is also called an annual well check. Dental exams once or twice a year. Routine eye exams. Ask your health care provider how often you should have your eyes checked. Personal lifestyle  choices, including: Daily care of your teeth and gums. Regular physical activity. Eating a healthy diet. Avoiding tobacco and drug use. Limiting alcohol use. Practicing safe sex. Taking low doses of aspirin every day. Taking vitamin and mineral supplements as recommended by your health care provider. What happens during an annual well check? The services and screenings done by your health care provider during your annual well check will depend on your age, overall health, lifestyle risk factors, and family history of disease. Counseling  Your health care provider may ask you questions about your: Alcohol use. Tobacco use. Drug use. Emotional well-being. Home and relationship well-being. Sexual activity. Eating habits. History of falls. Memory and ability to understand (cognition). Work and work Statistician. Screening  You may have the following tests or measurements: Height, weight, and BMI. Blood pressure. Lipid and cholesterol levels. These may be checked every 5 years, or more frequently if you are over 79 years old. Skin check. Lung cancer screening. You may have this screening every year starting at age 63 if you have a 30-pack-year history of smoking and currently smoke or have quit within the past 15 years. Fecal occult blood test (FOBT) of the stool. You may have this test every year starting at age 32. Flexible sigmoidoscopy or colonoscopy. You may have a sigmoidoscopy every  5 years or a colonoscopy every 10 years starting at age 63. Prostate cancer screening. Recommendations will vary depending on your family history and other risks. Hepatitis C blood test. Hepatitis B blood test. Sexually transmitted disease (STD) testing. Diabetes screening. This is done by checking your blood sugar (glucose) after you have not eaten for a while (fasting). You may have this done every 1-3 years. Abdominal aortic aneurysm (AAA) screening. You may need this if you are a current or  former smoker. Osteoporosis. You may be screened starting at age 63 if you are at high risk. Talk with your health care provider about your test results, treatment options, and if necessary, the need for more tests. Vaccines  Your health care provider may recommend certain vaccines, such as: Influenza vaccine. This is recommended every year. Tetanus, diphtheria, and acellular pertussis (Tdap, Td) vaccine. You may need a Td booster every 10 years. Zoster vaccine. You may need this after age 63. Pneumococcal 13-valent conjugate (PCV13) vaccine. One dose is recommended after age 63. Pneumococcal polysaccharide (PPSV23) vaccine. One dose is recommended after age 63. Talk to your health care provider about which screenings and vaccines you need and how often you need them. This information is not intended to replace advice given to you by your health care provider. Make sure you discuss any questions you have with your health care provider. Document Released: 02/06/2015 Document Revised: 09/30/2015 Document Reviewed: 11/11/2014 Elsevier Interactive Patient Education  2017 [place] Prevention in the Home Falls can cause injuries. They can happen to people of all ages. There are many things you can do to make your home safe and to help prevent falls. What can I do on the outside of my home? Regularly fix the edges of walkways and driveways and fix any cracks. Remove anything that might make you trip as you walk through a door, such as a raised step or threshold. Trim any bushes or trees on the path to your home. Use bright outdoor lighting. Clear any walking paths of anything that might make someone trip, such as rocks or tools. Regularly check to see if handrails are loose or broken. Make sure that both sides of any steps have handrails. Any raised decks and porches should have guardrails on the edges. Have any leaves, snow, or ice cleared regularly. Use sand or salt on walking paths  during winter. Clean up any spills in your garage right away. This includes oil or grease spills. What can I do in the bathroom? Use night lights. Install grab bars by the toilet and in the tub and shower. Do not use towel bars as grab bars. Use non-skid mats or decals in the tub or shower. If you need to sit down in the shower, use a plastic, non-slip stool. Keep the floor dry. Clean up any water that spills on the floor as soon as it happens. Remove soap buildup in the tub or shower regularly. Attach bath mats securely with double-sided non-slip rug tape. Do not have throw rugs and other things on the floor that can make you trip. What can I do in the bedroom? Use night lights. Make sure that you have a light by your bed that is easy to reach. Do not use any sheets or blankets that are too big for your bed. They should not hang down onto the floor. Have a firm chair that has side arms. You can use this for support while you get dressed. Do not have throw  rugs and other things on the floor that can make you trip. What can I do in the kitchen? Clean up any spills right away. Avoid walking on wet floors. Keep items that you use a lot in easy-to-reach places. If you need to reach something above you, use a strong step stool that has a grab bar. Keep electrical cords out of the way. Do not use floor polish or wax that makes floors slippery. If you must use wax, use non-skid floor wax. Do not have throw rugs and other things on the floor that can make you trip. What can I do with my stairs? Do not leave any items on the stairs. Make sure that there are handrails on both sides of the stairs and use them. Fix handrails that are broken or loose. Make sure that handrails are as long as the stairways. Check any carpeting to make sure that it is firmly attached to the stairs. Fix any carpet that is loose or worn. Avoid having throw rugs at the top or bottom of the stairs. If you do have throw  rugs, attach them to the floor with carpet tape. Make sure that you have a light switch at the top of the stairs and the bottom of the stairs. If you do not have them, ask someone to add them for you. What else can I do to help prevent falls? Wear shoes that: Do not have high heels. Have rubber bottoms. Are comfortable and fit you well. Are closed at the toe. Do not wear sandals. If you use a stepladder: Make sure that it is fully opened. Do not climb a closed stepladder. Make sure that both sides of the stepladder are locked into place. Ask someone to hold it for you, if possible. Clearly mark and make sure that you can see: Any grab bars or handrails. First and last steps. Where the edge of each step is. Use tools that help you move around (mobility aids) if they are needed. These include: Canes. Walkers. Scooters. Crutches. Turn on the lights when you go into a dark area. Replace any light bulbs as soon as they burn out. Set up your furniture so you have a clear path. Avoid moving your furniture around. If any of your floors are uneven, fix them. If there are any pets around you, be aware of where they are. Review your medicines with your doctor. Some medicines can make you feel dizzy. This can increase your chance of falling. Ask your doctor what other things that you can do to help prevent falls. This information is not intended to replace advice given to you by your health care provider. Make sure you discuss any questions you have with your health care provider. Document Released: 11/06/2008 Document Revised: 06/18/2015 Document Reviewed: 02/14/2014 Elsevier Interactive Patient Education  2017 [employer].

## 2022-04-29 ENCOUNTER — Ambulatory Visit: Payer: Medicare Other

## 2022-05-12 ENCOUNTER — Other Ambulatory Visit: Payer: Self-pay | Admitting: Family Medicine

## 2022-05-28 ENCOUNTER — Other Ambulatory Visit: Payer: Self-pay | Admitting: Family Medicine

## 2022-06-07 ENCOUNTER — Ambulatory Visit (INDEPENDENT_AMBULATORY_CARE_PROVIDER_SITE_OTHER): Payer: Medicare (Managed Care) | Admitting: Family Medicine

## 2022-06-07 ENCOUNTER — Encounter: Payer: Self-pay | Admitting: Family Medicine

## 2022-06-07 VITALS — BP 122/64 | HR 74 | Temp 98.2°F | Ht 68.0 in | Wt 218.0 lb

## 2022-06-07 DIAGNOSIS — E119 Type 2 diabetes mellitus without complications: Secondary | ICD-10-CM | POA: Diagnosis not present

## 2022-06-07 DIAGNOSIS — Z794 Long term (current) use of insulin: Secondary | ICD-10-CM

## 2022-06-07 LAB — POCT GLYCOSYLATED HEMOGLOBIN (HGB A1C): Hemoglobin A1C: 7.8 % — AB (ref 4.0–5.6)

## 2022-06-07 MED ORDER — BASAGLAR KWIKPEN 100 UNIT/ML ~~LOC~~ SOPN: 50.0000 [IU] | PEN_INJECTOR | Freq: Every day | SUBCUTANEOUS | 11 refills | Status: DC

## 2022-06-07 MED ORDER — GABAPENTIN 100 MG PO CAPS: 100.0000 mg | ORAL_CAPSULE | Freq: Three times a day (TID) | ORAL | 3 refills | Status: DC

## 2022-06-07 NOTE — Progress Notes (Signed)
Phone [phone] In person visit   Subjective:   Edgar Brewer is a 62 y.o. year old very pleasant male patient who presents for/with See problem oriented charting Chief Complaint  Patient presents with   Medical Management of Chronic Issues   Hyperlipidemia   Hypertension   Diabetes    Past Medical History-  Patient Active Problem List   Diagnosis Date Noted   History of stroke 02/25/2020    Priority: High   ESRD (end stage renal disease) on dialysis (HCC) 10/03/2018    Priority: High   Diabetes mellitus, type II, insulin dependent (HCC) 01/14/2015    Priority: High   Chronic diastolic CHF (congestive heart failure) (HCC) 01/13/2015    Priority: High   Fatty liver 02/10/2022    Priority: Medium    GERD (gastroesophageal reflux disease) 03/25/2020    Priority: Medium    Aortic stenosis 02/25/2020    Priority: Medium    Type 2 diabetes mellitus with diabetic neuropathy, unspecified (HCC) 02/25/2020    Priority: Medium    Gallbladder abscess 11/15/2018    Priority: Medium    Elevated LFTs 10/18/2018    Priority: Medium    Other disorders of phosphorus metabolism 01/19/2018    Priority: Medium    Secondary hyperparathyroidism of renal origin (HCC) 09/27/2017    Priority: Medium    Essential hypertension 01/14/2015    Priority: Medium    Anemia of chronic kidney failure 01/14/2015    Priority: Medium    Varicose veins of lower extremities with complications 12/24/2014    Priority: Medium    Wound of left leg 05/08/2020    Priority: Low   Left leg cellulitis 05/08/2020    Priority: Low   CKD (chronic kidney disease), stage IV (HCC) 01/14/2015    Priority: Low   Obesity 01/14/2015    Priority: Low   Chronic venous insufficiency 10/22/2014    Priority: Low    Medications- reviewed and updated Current Outpatient Medications  Medication Sig Dispense Refill   aspirin EC 81 MG tablet Take 81 mg by mouth daily. Swallow whole.     b complex-vitamin c-folic acid  (NEPHRO-VITE) 0.8 MG TABS tablet Take 1 tablet by mouth in the morning.     cinacalcet (SENSIPAR) 60 MG tablet Take 60 mg by mouth every Tuesday, Thursday, and Saturday at 6 PM. In the evening.     fluticasone (FLONASE) 50 MCG/ACT nasal spray Place 2 sprays into both nostrils daily. 16 g 3   Insulin Pen Needle 31G X 8 MM MISC Use to inject insulin into the skin daily. Dx: E11.9 100 each 3   lanthanum (FOSRENOL) 1000 MG chewable tablet Chew by mouth.     omeprazole (PRILOSEC) 20 MG capsule Take 1 capsule by mouth once daily 90 capsule 0   prednisoLONE acetate (PRED FORTE) 1 % ophthalmic suspension Place 1 drop into the right eye 4 (four) times daily.     rosuvastatin (CRESTOR) 10 MG tablet Take 1 tablet (10 mg total) by mouth daily. 90 tablet 3   sevelamer carbonate (RENVELA) 800 MG tablet Take 2 tablets (1,600 mg total) by mouth 3 (three) times daily with meals.     gabapentin (NEURONTIN) 100 MG capsule Take 1 capsule (100 mg total) by mouth 3 (three) times daily. Substitute for 300mg  once a day- patient prefers to space out dose 270 capsule 3   Insulin Glargine (BASAGLAR KWIKPEN) 100 UNIT/ML Inject 50 Units into the skin daily. 15 mL 11   No current facility-administered  medications for this visit.     Objective:  BP 122/64   Pulse 74   Temp 98.2 F (36.8 C)   Ht 5\' 8"  (1.727 m)   Wt 218 lb (98.9 kg)   SpO2 96%   BMI 33.15 kg/m  Gen: NAD, resting comfortably CV: RRR no murmurs rubs or gallops Lungs: CTAB no crackles, wheeze, rhonchi Ext: trace edema Skin: warm, dry     Assessment and Plan   # Floaters/discoloration in visual field at times-last visit encouraged urgent follow-up with his ophthalmologist-he reports ended up requiring procedure and has done much better since that time- floaters /color change improved but has some different floaters now- plans to follow up with eye doctor   # elevated PSA- due for urology follow up - encourage dhim again today- he is worried about  cost though and is leaning  towards not follow up despite risk of prostate cancer and death Lab Results  Component Value Date   PSA 4.22 (H) 07/06/2021   PSA 3.90 12/10/2020   #End-stage renal disease on hemodialysis since January 2019. thorugh related to diabetes #Secondary hyperparathyroidism S: Patient on dialysis followed by Dr. Eliott Nine on MWF at [facility] site  -AV fistula 06/01/2016 by Dr. Arbie Cookey A/P: reports overall stability- continue current medications   -Patient compliant with Sensipar for secondary hyperparathyroidism -On Renvela phosphate binder and fosrenal it appers  #Heart failure with  ejection fraction-follows with Dr. Eden Emms S: Medication: lasix in past, now managed by dialysis   Edema: none Weight gain:none Shortness of breath: none reported A/P: euvolemic with dialysis- continue current treatmnet   #hypertension S: medication: none A/P: blood pressure controlled without medicine - continue to monitor   #Aortic stenosis S:Last echocardiogram august 2022-reported as mild with year repeat per Dr. Eden Emms A/P: aortic stenosis noted- follow up with cardiology- last visit January 2024 no repeat echocardiogram was plenned    # Diabetes with neuropathy S: Medication:basaglar 50 units, gabapentin 300mg  daily limit per dialysis-going to try 100 mg 3 times a day- no sedation CBGs- feels its improving- was told 7.5 with most recent dialysis visit. Sugars down to 117- had been over 200- but he is running out of insulin as used to be only on 30-40 units now using more Lab Results  Component Value Date   HGBA1C 7.8 (A) 06/07/2022   HGBA1C 8.0 (A) 02/10/2022   HGBA1C 9.9 08/04/2021   A/P: reasonable control while avoiding low sugars- continue current medications   #history of stroke- now on aspirin 81mg - stutter lingers from this #hyperlipidemia S: Medication: in 2023 back on rosuvastatin 10 mg  Lab Results  Component Value Date   CHOL 147 07/06/2021   HDL 36.10 (L)  07/06/2021   LDLDIRECT 41.0 07/06/2021   TRIG (H) 07/06/2021    529.0 Triglyceride is over 400; calculations on Lipids are invalid.   CHOLHDL 4 07/06/2021   A/P: LDL at goal but triglyceride(s) high- work on diet/exercise/weight loss- would like for dialysis to check lipid panel in a month or so and send [place] results   #Elevated LFTs S: Patient with laparoscopic cholecystectomy September 2020 complicated by gallbladder fossa abscess status post percutaneous aspiration and JP drain in October 2020.  Ultimately developed sepsis with bacteremia.  LFTs were slowly improving per notes on February 07, 2019 visit with Dr. Elyn Peers was for repeat LFTs in 3 months, avoid hepatotoxins, NSAIDs, alcohol, OTC meds -They also are planning for repeat colonoscopy next visit but he declined due to cost and preference  unfortunately A/P:  hopefully improving- need copy updated LFTs- will eithe rorder or have them send [place] most recnet    # former smoker- get AAA scan at 64 . Offered lung cancer screening program - he declines again today  Recommended follow up: Return in about 4 months (around 10/08/2022) for followup or sooner if needed.Schedule b4 you leave. Future Appointments  Date Time Provider Department Center  04/24/2023 11:45 AM [facility] ANNUAL WELLNESS VISIT 1 [facility]    Lab/Order associations:   ICD-10-CM   1. Diabetes mellitus, type II, insulin dependent (HCC)  E11.9 POCT HgB A1C   Z79.4 Comprehensive metabolic panel    Lipid panel      Meds ordered this encounter  Medications   Insulin Glargine (BASAGLAR KWIKPEN) 100 UNIT/ML    Sig: Inject 50 Units into the skin daily.    Dispense:  15 mL    Refill:  11   gabapentin (NEURONTIN) 100 MG capsule    Sig: Take 1 capsule (100 mg total) by mouth 3 (three) times daily. Substitute for 300mg  once a day- patient prefers to space out dose    Dispense:  270 capsule    Refill:  3    Return precautions advised.  Tana Conch, MD

## 2022-06-07 NOTE — Patient Instructions (Addendum)
Have a copy of diabetic eye exam sent to [place] at [phone] when you go in Oct.  Let us know when you decide to get our [facility] vaccine or anymore COVID vaccines.  Recommend follow up with urology - see print out  - would like for dialysis to check lipid panel in a month or so and send [place] results- also would love updated CMP - please give printouts to dialysis and see if they will send to [place] -team please print off CMP and lipid panel Or see if they will send me copy of recnet labs  Health Maintenance Due  Topic Date Due   Lung Cancer Screening  11/15/2019  Let us know if you change your mind and want to do lung cancer screening   Recommended follow up: Return in about 4 months (around 10/08/2022) for followup or sooner if needed.Schedule b4 you leave.

## 2022-09-08 ENCOUNTER — Encounter (INDEPENDENT_AMBULATORY_CARE_PROVIDER_SITE_OTHER): Payer: Self-pay

## 2022-09-19 ENCOUNTER — Other Ambulatory Visit: Payer: Self-pay | Admitting: Family Medicine

## 2022-09-22 ENCOUNTER — Ambulatory Visit (INDEPENDENT_AMBULATORY_CARE_PROVIDER_SITE_OTHER): Payer: Medicare (Managed Care) | Admitting: Internal Medicine

## 2022-09-22 ENCOUNTER — Encounter: Payer: Self-pay | Admitting: Internal Medicine

## 2022-09-22 VITALS — BP 101/65 | HR 78 | Temp 98.0°F | Ht 68.0 in | Wt 216.8 lb

## 2022-09-22 DIAGNOSIS — R21 Rash and other nonspecific skin eruption: Secondary | ICD-10-CM

## 2022-09-22 DIAGNOSIS — I83023 Varicose veins of left lower extremity with ulcer of ankle: Secondary | ICD-10-CM | POA: Diagnosis not present

## 2022-09-22 DIAGNOSIS — L97321 Non-pressure chronic ulcer of left ankle limited to breakdown of skin: Secondary | ICD-10-CM | POA: Diagnosis not present

## 2022-09-22 DIAGNOSIS — R438 Other disturbances of smell and taste: Secondary | ICD-10-CM | POA: Diagnosis not present

## 2022-09-22 NOTE — Progress Notes (Signed)
[facility]: [phone]   -- Medical Office Visit --  Patient:  Edgar Brewer      Age: 62 y.o.       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 Poor circulation in lower extremities is noted, alongside a history of dialysis. We will apply antibiotic ointment and bandage the lesion, advise them to elevate their legs above the heart level to improve circulation, and instruct not to pop future blisters but to come in for sterile drainage instead. They should follow up with Dr. Durene Cal in a couple of weeks, or sooner if the condition worsens or new symptoms develop.  Coordinated / shared this note to Primary Care Provider (PCP) re: vein specialist follow up and longitudinal management.  Instructions given to patient in AVS on wound management.  Blistering rash Uncertainty regarding the ulceration due to it formed a large blister and he popped it.  Gave warnings regarding emergency room.   Metallic taste They report a metallic taste in their mouth, managed by frequent consumption of Life Savers, with no visible oral lesions observed. No immediate intervention is required. Suspect(s) related with dialysis      Adult male has Chronic venous insufficiency; Varicose veins of lower extremities with complications; Chronic diastolic CHF (congestive heart failure) (HCC); CKD (chronic  kidney disease), stage IV (HCC); Diabetes mellitus, type II, insulin dependent (HCC); Obesity; Essential hypertension; Anemia of chronic kidney failure; ESRD (end stage renal disease) on dialysis (HCC); Elevated LFTs; Gallbladder abscess; Other disorders of phosphorus metabolism; Secondary hyperparathyroidism of renal origin (HCC); Aortic stenosis; Type 2 diabetes mellitus with diabetic neuropathy, unspecified (HCC); History of stroke; GERD (gastroesophageal reflux disease); Wound of left leg; Left leg cellulitis; and Fatty liver on their problem list. 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 Future Appointments  Date Time Provider Department Center  10/06/2022  3:20 PM Shelva Majestic, MD [facility]  04/24/2023 11:45 AM [facility] ANNUAL WELLNESS VISIT 1 [facility]            Subjective   62 y.o. male who has Chronic venous insufficiency; Varicose veins of lower extremities with complications; Chronic diastolic CHF (congestive heart failure) (HCC); CKD (chronic kidney disease), stage IV (HCC); Diabetes mellitus, type II, insulin dependent (HCC); Obesity; Essential hypertension; Anemia of chronic kidney failure; ESRD (end stage renal disease) on dialysis (HCC); Elevated LFTs; Gallbladder abscess;  Other disorders of phosphorus metabolism; Secondary hyperparathyroidism of renal origin (HCC); Aortic  stenosis; Type 2 diabetes mellitus with diabetic neuropathy, unspecified (HCC); History of stroke; GERD (gastroesophageal reflux disease); Wound of left leg; Left leg cellulitis; and Fatty liver on their problem list. His reasons/main concerns/chief complaints for today's office visit are Seeping wound (Left ankle and is leaking fluid for a few days. Intermittent stinging sensation.)   ------------------------------------------------------------------------------------------------------------------------ AI-Extracted: Discussed the use of AI scribe software for clinical note transcription with the patient, who gave verbal consent to proceed.  History of Present Illness   The patient presents with a blister on the left ankle that appeared suddenly a couple of days ago. They report that it initially presented as a white bubble filled with fluid, which they subsequently ruptured due to discomfort. The patient describes the sensation as a stinging pain that occasionally intensifies. They also report a similar lesion on the other foot that appeared about a week ago and has since almost completely healed.  The patient has a history of similar skin issues, including "blown up legs" that occurred years ago. They also report a chronic metallic taste in their mouth, which they manage with frequent consumption of Life Savers candy.  The patient has been on insulin therapy for the past couple of months, which they administer via a pen injection in their side. They deny any new medications or symptoms such as fevers, body aches, or shortness of breath. They also deny any recent trauma or pressure on the affected area that could have caused the blister.      He has a past medical history of Acute on chronic combined systolic and diastolic CHF, NYHA class 1 (HCC) (01/13/2015), Allergy, Arthritis, Chronic kidney disease, Diabetes mellitus type 2 with complications (HCC), GERD (gastroesophageal reflux disease), Head  injury with loss of consciousness (HCC), History of kidney stones, Hypertension, Neuromuscular disorder (HCC), Neuropathy, Pulmonary hypertension (HCC), Stroke (HCC), and Varicose veins of lower extremities with complications (12/24/2014).  Problem list overviews that were updated at today's visit: No problems updated. Current Outpatient Medications on File Prior to Visit  Medication Sig   aspirin EC 81 MG tablet Take 81 mg by mouth daily. Swallow whole.   b complex-vitamin c-folic acid (NEPHRO-VITE) 0.8 MG TABS tablet Take 1 tablet by mouth in the morning.   cinacalcet (SENSIPAR) 60 MG tablet Take 60 mg by mouth every Tuesday, Thursday, and Saturday at 6 PM. In the evening.   fluticasone (FLONASE) 50 MCG/ACT nasal spray Place 2 sprays into both nostrils daily.   gabapentin (NEURONTIN) 100 MG capsule Take 1 capsule (100 mg total) by mouth 3 (three) times daily. Substitute for 300mg  once a day- patient prefers to space out dose   Insulin Glargine (BASAGLAR KWIKPEN) 100 UNIT/ML Inject 50 Units into the skin daily.   Insulin Pen Needle 31G X 8 MM MISC Use to inject insulin into the skin daily. Dx: E11.9   lanthanum (FOSRENOL) 1000 MG chewable tablet Chew by mouth.   omeprazole (PRILOSEC) 20 MG capsule Take 1 capsule by mouth once daily   prednisoLONE acetate (PRED FORTE) 1 % ophthalmic suspension Place 1 drop into the right eye 4 (four) times daily.   rosuvastatin (CRESTOR) 10 MG tablet Take 1 tablet (10 mg total) by mouth daily.   sevelamer carbonate (RENVELA) 800 MG tablet Take 2 tablets (1,600 mg total) by mouth 3 (three) times daily with meals.   No current facility-administered medications on file prior to visit.  There are no  discontinued medications.   Objective   Physical Exam  BP 101/65 (BP Location: Right Arm, Patient Position: Sitting)   Pulse 78   Temp 98 F (36.7 C) (Temporal)   Ht 5\' 8"  (1.727 m)   Wt 216 lb 12.8 oz (98.3 kg)   SpO2 96%   BMI 32.96 kg/m  Wt Readings from  Last 10 Encounters:  09/22/22 216 lb 12.8 oz (98.3 kg)  06/07/22 218 lb (98.9 kg)  04/19/22 218 lb (98.9 kg)  03/17/22 218 lb 3.2 oz (99 kg)  02/22/22 216 lb (98 kg)  02/10/22 220 lb (99.8 kg)  10/21/21 215 lb (97.5 kg)  07/06/21 209 lb 3.2 oz (94.9 kg)  03/30/21 203 lb 12.8 oz (92.4 kg)  02/25/21 203 lb 14.8 oz (92.5 kg)   Vital signs reviewed.  Nursing notes reviewed. Weight trend reviewed. Abnormalities and Problem-Specific physical exam findings:  Photographs Taken 09/22/2022 :    After evaluation of above, bacitracin coated bandage was applied over it. General Appearance:  No acute distress appreciable.   Well-groomed, healthy-appearing male.  Well proportioned with no abnormal fat distribution.  Good muscle tone. Pulmonary:  Normal work of breathing at rest, no respiratory distress apparent. SpO2: 96 %  Musculoskeletal: All extremities are intact.  Neurological:  Awake, alert, oriented, and engaged.  No obvious focal neurological deficits or cognitive impairments.  Sensorium seems unclouded.   Speech is clear and coherent with logical content. Psychiatric:  Appropriate mood, pleasant and cooperative demeanor, thoughtful and engaged during the exam  Results   Procedure: Wound care Description: Bandage removed from left ankle blister. Area inspected for drainage. Antibiotic ointment applied. New bandage placed.        No results found for any visits on 09/22/22.  Office Visit on 06/07/2022  Component Date Value   Hemoglobin A1C 06/07/2022 7.8 (A)   Hospital Outpatient Visit on 03/17/2022  Component Date Value   Right ABI 03/17/2022 Noncompressible    Left ABI 03/17/2022 Noncompressible   Office Visit on 02/10/2022  Component Date Value   Hemoglobin A1C 02/10/2022 8.0 (A)    No image results found.   No results found.  VAS [place] ABI WITH/WO TBI  Result Date: 03/17/2022  LOWER EXTREMITY DOPPLER STUDY Patient Name:  RAYAAN MONAGLE  Date of Exam:   03/17/2022 Medical Rec #:  [id number]     Accession #:    [id number] Date of Birth: 1960/12/29     Patient Gender: M Patient Age:   3 years Exam Location:  [facility] Procedure:      VAS [place] ABI WITH/WO TBI Referring Phys: EMMA COLLINS --------------------------------------------------------------------------------  Indications: Peripheral artery disease. High Risk Factors: Hypertension, Diabetes, prior CVA.  Comparison Study: 09/24/20 ABI/TBI- right=[place]/0.81, left= [place]/0.77 Performing Technologist: Gertie Fey MHA, RVT, RDCS, RDMS  Examination Guidelines: A complete evaluation includes at minimum, Doppler waveform signals and systolic blood pressure reading at the level of bilateral brachial, anterior tibial, and posterior tibial arteries, when vessel segments are accessible. Bilateral testing is considered an integral part of a complete examination. Photoelectric Plethysmograph (PPG) waveforms and toe systolic pressure readings are included as required and additional duplex testing as needed. Limited examinations for reoccurring indications may be performed as noted.  ABI Findings: +---------+------------------+-----+--------+--------+ Right    Rt Pressure (mmHg)IndexWaveformComment  +---------+------------------+-----+--------+--------+ Brachial 150                                     +---------+------------------+-----+--------+--------+  PTA      Noncompressible        biphasic         +---------+------------------+-----+--------+--------+ DP       Noncompressible        biphasic         +---------+------------------+-----+--------+--------+ Great Toe92                0.61                  +---------+------------------+-----+--------+--------+ +---------+------------------+-----+----------+-------+ Left     Lt Pressure (mmHg)IndexWaveform  Comment +---------+------------------+-----+----------+-------+ PTA      Noncompressible        monophasic         +---------+------------------+-----+----------+-------+ DP       Noncompressible        biphasic          +---------+------------------+-----+----------+-------+ Randie Heinz Toe87                0.58                   +---------+------------------+-----+----------+-------+ +-------+---------------+-----------+---------------+------------+ ABI/TBIToday's ABI    Today's TBIPrevious ABI   Previous TBI +-------+---------------+-----------+---------------+------------+ Right  Noncompressible0.61       Noncompressible0.81         +-------+---------------+-----------+---------------+------------+ Left   Noncompressible0.58       Noncompressible0.77         +-------+---------------+-----------+---------------+------------+  Bilateral ABIs appear essentially unchanged compared to prior study on 09/24/2020. Bilateral TBIs appear decreased compared to prior study on 09/24/2020.  Summary: Right: Resting right ankle-brachial index indicates noncompressible right lower extremity arteries. The right toe-brachial index is abnormal. Left: Resting left ankle-brachial index indicates noncompressible left lower extremity arteries. The left toe-brachial index is abnormal. *See table(s) above for measurements and observations.  Electronically signed by Waverly Ferrari MD on 03/17/2022 at 1:23:21 PM.    Final        Additional Info: This encounter employed real-time, collaborative documentation. The patient actively reviewed and updated their medical record on a shared screen, ensuring transparency and facilitating joint problem-solving for the problem list, overview, and plan. This approach promotes accurate, informed care. The treatment plan was discussed and reviewed in detail, including medication safety, potential side effects, and all patient questions. We confirmed understanding and comfort with the plan. Follow-up instructions were established, including contacting the office for any concerns, returning  if symptoms worsen, persist, or new symptoms develop, and precautions for potential emergency department visits.

## 2022-09-22 NOTE — Patient Instructions (Addendum)
VISIT SUMMARY:  During your visit, we discussed the blister on your left ankle and the metallic taste in your mouth. The blister is likely due to poor circulation in your lower legs, a condition known as a venous stasis ulcer. The metallic taste in your mouth does not require immediate intervention.  YOUR PLAN:  -VENOUS STASIS ULCER: This is a type of wound that occurs due to poor blood circulation in your lower legs. We have applied antibiotic ointment and a bandage to the blister. To help improve circulation, try to elevate your legs above the level of your heart when possible. If you notice any new blisters, please do not pop them yourself. Instead, come in for a sterile drainage procedure.  -ORAL HEALTH: The metallic taste in your mouth is not causing any visible problems at this time. Continue to manage it as you have been doing.  INSTRUCTIONS:  Please follow up with Dr. Durene Cal in a couple of weeks, or sooner if your condition worsens or new symptoms develop.  Managing the Rash:  1. Keep the area clean and dry. 2. Don't pop the blisters. 3. Use soft bandages to cover the rash. 4. The doctor might give special creams or medicines. 5. Raise the legs when sitting or lying down. 6. Wear loose, comfortable clothes. 7. Follow the doctor's instructions carefully.  Watch Out for Danger Signs:  Sometimes, a rash can be very serious. Call the doctor right away if:  1. The rash spreads quickly. 2. You see blisters in the mouth, eyes, or private parts. 3. The skin starts to peel off in sheets. 4. You get a fever or feel very sick. 5. It's hard to breathe or swallow.  These could be signs of a very bad skin reaction. The doctor needs to check it fast.  Remember: - Don't start any new medicines without asking the doctor. - Tell the doctor about all medicines you're taking. - If you're not sure about something, always ask the doctor or nurse.  Taking good care of your skin and  following the doctor's advice is very important. They will help you feel better and stay safe.

## 2022-09-28 ENCOUNTER — Other Ambulatory Visit: Payer: Self-pay

## 2022-09-28 MED ORDER — OMEPRAZOLE 20 MG PO CPDR: 20.0000 mg | DELAYED_RELEASE_CAPSULE | Freq: Every day | ORAL | 0 refills | Status: DC

## 2022-10-06 ENCOUNTER — Encounter: Payer: Self-pay | Admitting: Family Medicine

## 2022-10-06 ENCOUNTER — Ambulatory Visit: Payer: PRIVATE HEALTH INSURANCE | Admitting: Family Medicine

## 2022-10-06 ENCOUNTER — Other Ambulatory Visit: Payer: Self-pay

## 2022-10-06 VITALS — BP 116/50 | HR 84 | Temp 98.7°F | Ht 68.0 in | Wt 220.2 lb

## 2022-10-06 DIAGNOSIS — Z794 Long term (current) use of insulin: Secondary | ICD-10-CM

## 2022-10-06 DIAGNOSIS — I10 Essential (primary) hypertension: Secondary | ICD-10-CM | POA: Diagnosis not present

## 2022-10-06 DIAGNOSIS — Z23 Encounter for immunization: Secondary | ICD-10-CM

## 2022-10-06 DIAGNOSIS — I83023 Varicose veins of left lower extremity with ulcer of ankle: Secondary | ICD-10-CM | POA: Diagnosis not present

## 2022-10-06 DIAGNOSIS — E1169 Type 2 diabetes mellitus with other specified complication: Secondary | ICD-10-CM

## 2022-10-06 DIAGNOSIS — L97321 Non-pressure chronic ulcer of left ankle limited to breakdown of skin: Secondary | ICD-10-CM

## 2022-10-06 DIAGNOSIS — E119 Type 2 diabetes mellitus without complications: Secondary | ICD-10-CM

## 2022-10-06 DIAGNOSIS — E785 Hyperlipidemia, unspecified: Secondary | ICD-10-CM

## 2022-10-06 LAB — POCT GLYCOSYLATED HEMOGLOBIN (HGB A1C): Hemoglobin A1C: 7.3 % — AB (ref 4.0–5.6)

## 2022-10-06 NOTE — Progress Notes (Signed)
Phone [phone] In person visit   Subjective:   Edgar Brewer is a 62 y.o. year old very pleasant male patient who presents for/with See problem oriented charting Chief Complaint  Patient presents with   3 mo FU    Diabetes   Blister    Pt had blister on Lt ankle that has popped, red   Past Medical History-  Patient Active Problem List   Diagnosis Date Noted   History of stroke 02/25/2020    Priority: High   ESRD (end stage renal disease) on dialysis (HCC) 10/03/2018    Priority: High   Diabetes mellitus, type II, insulin dependent (HCC) 01/14/2015    Priority: High   Chronic diastolic CHF (congestive heart failure) (HCC) 01/13/2015    Priority: High   Fatty liver 02/10/2022    Priority: Medium    GERD (gastroesophageal reflux disease) 03/25/2020    Priority: Medium    Aortic stenosis 02/25/2020    Priority: Medium    Type 2 diabetes mellitus with diabetic neuropathy, unspecified (HCC) 02/25/2020    Priority: Medium    Gallbladder abscess 11/15/2018    Priority: Medium    Elevated LFTs 10/18/2018    Priority: Medium    Other disorders of phosphorus metabolism 01/19/2018    Priority: Medium    Secondary hyperparathyroidism of renal origin (HCC) 09/27/2017    Priority: Medium    Essential hypertension 01/14/2015    Priority: Medium    Anemia of chronic kidney failure 01/14/2015    Priority: Medium    Varicose veins of lower extremities with complications 12/24/2014    Priority: Medium    Wound of left leg 05/08/2020    Priority: Low   Left leg cellulitis 05/08/2020    Priority: Low   CKD (chronic kidney disease), stage IV (HCC) 01/14/2015    Priority: Low   Obesity 01/14/2015    Priority: Low   Chronic venous insufficiency 10/22/2014    Priority: Low    Medications- reviewed and updated Current Outpatient Medications  Medication Sig Dispense Refill   aspirin EC 81 MG tablet Take 81 mg by mouth daily. Swallow whole.     b complex-vitamin c-folic acid  (NEPHRO-VITE) 0.8 MG TABS tablet Take 1 tablet by mouth in the morning.     cinacalcet (SENSIPAR) 60 MG tablet Take 60 mg by mouth every Tuesday, Thursday, and Saturday at 6 PM. In the evening.     gabapentin (NEURONTIN) 100 MG capsule Take 1 capsule (100 mg total) by mouth 3 (three) times daily. Substitute for 300mg  once a day- patient prefers to space out dose 270 capsule 3   Insulin Glargine (BASAGLAR KWIKPEN) 100 UNIT/ML Inject 50 Units into the skin daily. 15 mL 11   Insulin Pen Needle 31G X 8 MM MISC Use to inject insulin into the skin daily. Dx: E11.9 100 each 3   omeprazole (PRILOSEC) 20 MG capsule Take 1 capsule (20 mg total) by mouth daily. 90 capsule 0   rosuvastatin (CRESTOR) 10 MG tablet Take 1 tablet (10 mg total) by mouth daily. 90 tablet 3   sevelamer carbonate (RENVELA) 800 MG tablet Take 2 tablets (1,600 mg total) by mouth 3 (three) times daily with meals.     fluticasone (FLONASE) 50 MCG/ACT nasal spray Place 2 sprays into both nostrils daily. (Patient not taking: Reported on 10/06/2022) 16 g 3   lanthanum (FOSRENOL) 1000 MG chewable tablet Chew by mouth. (Patient not taking: Reported on 10/06/2022)     prednisoLONE acetate (PRED FORTE)  1 % ophthalmic suspension Place 1 drop into the right eye 4 (four) times daily. (Patient not taking: Reported on 10/06/2022)     No current facility-administered medications for this visit.     Objective:  BP (!) 116/50   Pulse 84   Temp 98.7 F (37.1 C) (Temporal)   Ht 5\' 8"  (1.727 m)   Wt 220 lb 3.2 oz (99.9 kg)   SpO2 96%   BMI 33.48 kg/m  Gen: NAD, resting comfortably CV: RRR no murmurs rubs or gallops Lungs: CTAB no crackles, wheeze, rhonchi Ext: trace edema Skin: warm, dry     Assessment and Plan   #Venous stasis ulcer- reports blister that popped- substantially improved. Reports varicose vein treatment in left leg- better since that time. Has been to wound care in the past.  - 5 x 3 cm down to 3 x 2 cm- gradually improving  and tissues around this look good without signs of infection- continue to monitor - do not think we need wound care at this point  #Joint stiffness in hands- some pain and difficulty with stiffness-could try Voltaren gel topically  #Knee pain- bilateral and likely arthritis related. Discussed weight loss importance.  - would be interested in injections if needed. Had in past but doesn't remember how much it helped.  -Offered sports medicine consult but he declines  #End-stage renal disease on hemodialysis since January 2019. thorugh related to diabetes #Secondary hyperparathyroidism S: Patient on dialysis  MWF at [facility] site  -AV fistula 06/01/2016 by Dr. Arbie Cookey A/P: Reports reasonable stability and compliance with dialysis- -Patient compliant with Sensipar for secondary hyperparathyroidism -On Renvela phosphate binder  #hypertension S: medication: None-blood pressure now managed with dialysis and no medicine A/P: Stable without medicine-continue to monitor    # Diabetes with neuropathy S: Medication:basaglar 50 units, gabapentin 300mg  daily limit per dialysis-going to try 100 mg 3 times a day- no sedation thankfully -interested in ozempic CBGs- lowest he has seen is 92 and 98. Highest he has seen was 250 in day before basaglar Exercise and diet- no exercising- bothers knees- mentioned weight- he is not interested Lab Results  Component Value Date   HGBA1C 7.3 (A) 10/06/2022   HGBA1C 7.8 (A) 06/07/2022   HGBA1C 8.0 (A) 02/10/2022   A/P: A1c has improved.  Hold off on medication changes at this time-he is interested in Ozempic-would consider this if A1c trends up at all plus he may want to just try for weight loss-might reduce insulin by 5-10 units or so if you were to start Ozempic-would want to avoid lows   #history of stroke- now on aspirin 81mg - stutter lingers from this #hyperlipidemia S: Medication: in 2023 back on rosuvastatin 10 mg  Lab Results  Component Value Date   CHOL  147 07/06/2021   HDL 36.10 (L) 07/06/2021   LDLDIRECT 41.0 07/06/2021   TRIG (H) 07/06/2021    529.0 Triglyceride is over 400; calculations on Lipids are invalid.   CHOLHDL 4 07/06/2021     A/P: LDL has been well-controlled-we are going to try to get a copy of his most recent labs-he was to sign release of information-consider labs here if no recent lipids obtained or see if we can get done at dialysis-we printed a CMP and lipid panel last visit that he was to take to dialysis but we never received records -Previously with some elevated LFTs so we want to monitor   Recommended follow up: Return in about 4 months (around 02/05/2023) for followup  or sooner if needed.Schedule b4 you leave. Future Appointments  Date Time Provider Department Center  02/07/2023  3:20 PM Shelva Majestic, MD [facility]  04/24/2023 11:45 AM [facility] ANNUAL WELLNESS VISIT 1 [facility]    Lab/Order associations:   ICD-10-CM   1. Diabetes mellitus, type II, insulin dependent (HCC)  E11.9 POCT glycosylated hemoglobin (Hb A1C)   Z79.4     2. Essential hypertension  I10     3. 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 Need for influenza vaccination  Z23 Flu vaccine trivalent PF, 6mos and older(Flulaval,Afluria,Fluarix,Fluzone)    5. Hyperlipidemia associated with type 2 diabetes mellitus (HCC)  E11.69    E78.5       No orders of the defined types were placed in this encounter.   Return precautions advised.  Tana Conch, MD

## 2022-10-06 NOTE — Patient Instructions (Addendum)
A1c today- Point of Care (POC)  I will try to see if Ozempic is ok for dialysis patients and let you know  Sign release of information at the check out desk for labs from last 6 months and notes from [facility]  Recommended follow up: Return in about 4 months (around 02/05/2023) for followup or sooner if needed.Schedule b4 you leave.

## 2022-10-18 ENCOUNTER — Encounter: Payer: Self-pay | Admitting: Family Medicine

## 2022-10-24 ENCOUNTER — Other Ambulatory Visit: Payer: Self-pay | Admitting: Family Medicine

## 2022-12-03 ENCOUNTER — Other Ambulatory Visit: Payer: Self-pay | Admitting: Family Medicine

## 2022-12-30 ENCOUNTER — Other Ambulatory Visit: Payer: Self-pay | Admitting: Family Medicine

## 2023-01-08 ENCOUNTER — Encounter (HOSPITAL_COMMUNITY): Payer: Self-pay

## 2023-01-30 ENCOUNTER — Other Ambulatory Visit: Payer: Self-pay | Admitting: Family Medicine

## 2023-02-06 LAB — LAB REPORT - SCANNED: A1c: 7.1

## 2023-02-07 ENCOUNTER — Ambulatory Visit (INDEPENDENT_AMBULATORY_CARE_PROVIDER_SITE_OTHER): Payer: Medicare (Managed Care) | Admitting: Family Medicine

## 2023-02-07 ENCOUNTER — Ambulatory Visit: Payer: PRIVATE HEALTH INSURANCE | Admitting: Family Medicine

## 2023-02-07 ENCOUNTER — Encounter: Payer: Self-pay | Admitting: Family Medicine

## 2023-02-07 VITALS — BP 120/70 | HR 90 | Temp 98.1°F | Ht 68.0 in | Wt 222.6 lb

## 2023-02-07 DIAGNOSIS — N186 End stage renal disease: Secondary | ICD-10-CM

## 2023-02-07 DIAGNOSIS — Z794 Long term (current) use of insulin: Secondary | ICD-10-CM | POA: Diagnosis not present

## 2023-02-07 DIAGNOSIS — I10 Essential (primary) hypertension: Secondary | ICD-10-CM

## 2023-02-07 DIAGNOSIS — E1169 Type 2 diabetes mellitus with other specified complication: Secondary | ICD-10-CM

## 2023-02-07 DIAGNOSIS — E119 Type 2 diabetes mellitus without complications: Secondary | ICD-10-CM | POA: Diagnosis not present

## 2023-02-07 DIAGNOSIS — I5032 Chronic diastolic (congestive) heart failure: Secondary | ICD-10-CM | POA: Diagnosis not present

## 2023-02-07 DIAGNOSIS — E785 Hyperlipidemia, unspecified: Secondary | ICD-10-CM

## 2023-02-07 DIAGNOSIS — Z992 Dependence on renal dialysis: Secondary | ICD-10-CM

## 2023-02-07 LAB — POCT GLYCOSYLATED HEMOGLOBIN (HGB A1C): Hemoglobin A1C: 7.1 % — AB (ref 4.0–5.6)

## 2023-02-07 MED ORDER — OZEMPIC (0.25 OR 0.5 MG/DOSE) 2 MG/3ML ~~LOC~~ SOPN: PEN_INJECTOR | SUBCUTANEOUS | 0 refills | Status: AC

## 2023-02-07 NOTE — Patient Instructions (Addendum)
 Have copy of diabetic eye exam sent to us  at [phone] next month.  diabetes has improved slightly on Point of Care (POC) test today- he will continue basaglar  50 units for now- we will add Ozempic  0.25 mg for 4 weeks then go up to 0.5 mg for 4 weeks. If he feels good on that dose we can continue or bump up to 1 mg- he will message me in about 6 weeks with how he is feeling/doing -any sugar under 80 let me know and we will reduce insulin  -also hoping this helps with weight loss  #prior PSA elevation- encouraged follow up with urology  Recommended follow up: Return in about 4 months (around 06/07/2023) for followup or sooner if needed.Schedule b4 you leave.

## 2023-02-07 NOTE — Progress Notes (Signed)
 Phone [phone] In person visit   Subjective:   Edgar Brewer is a 63 y.o. year old very pleasant male patient who presents for/with See problem oriented charting Chief Complaint  Patient presents with   Medical Management of Chronic Issues   Diabetes    DEE scheduled on 02/27/23   Hyperlipidemia   Hypertension   joint stiffness    Pt c/o joint stiffness in knees and ankles    Past Medical History-  Patient Active Problem List   Diagnosis Date Noted   History of stroke 02/25/2020    Priority: High   ESRD (end stage renal disease) on dialysis (HCC) 10/03/2018    Priority: High   Diabetes mellitus, type II, insulin  dependent (HCC) 01/14/2015    Priority: High   Chronic diastolic CHF (congestive heart failure) (HCC) 01/13/2015    Priority: High   Fatty liver 02/10/2022    Priority: Medium    GERD (gastroesophageal reflux disease) 03/25/2020    Priority: Medium    Aortic stenosis 02/25/2020    Priority: Medium    Type 2 diabetes mellitus with diabetic neuropathy, unspecified (HCC) 02/25/2020    Priority: Medium    Gallbladder abscess 11/15/2018    Priority: Medium    Elevated LFTs 10/18/2018    Priority: Medium    Other disorders of phosphorus metabolism 01/19/2018    Priority: Medium    Secondary hyperparathyroidism of renal origin (HCC) 09/27/2017    Priority: Medium    Essential hypertension 01/14/2015    Priority: Medium    Anemia of chronic kidney failure 01/14/2015    Priority: Medium    Varicose veins of lower extremities with complications 12/24/2014    Priority: Medium    Wound of left leg 05/08/2020    Priority: Low   Left leg cellulitis 05/08/2020    Priority: Low   CKD (chronic kidney disease), stage IV (HCC) 01/14/2015    Priority: Low   Obesity 01/14/2015    Priority: Low   Chronic venous insufficiency 10/22/2014    Priority: Low    Medications- reviewed and updated Current Outpatient Medications  Medication Sig Dispense Refill   aspirin   EC 81 MG tablet Take 81 mg by mouth daily. Swallow whole.     b complex-vitamin c -folic acid  (NEPHRO-VITE) 0.8 MG TABS tablet Take 1 tablet by mouth in the morning.     cinacalcet  (SENSIPAR ) 60 MG tablet Take 60 mg by mouth every Tuesday, Thursday, and Saturday at 6 PM. In the evening.     fluticasone  (FLONASE ) 50 MCG/ACT nasal spray Place 2 sprays into both nostrils daily. 16 g 3   gabapentin  (NEURONTIN ) 100 MG capsule Take 1 capsule (100 mg total) by mouth 3 (three) times daily. Substitute for 300mg  once a day- patient prefers to space out dose 270 capsule 3   Insulin  Glargine (BASAGLAR  KWIKPEN) 100 UNIT/ML Inject 50 Units into the skin daily. 15 mL 11   Insulin  Pen Needle (B-D ULTRAFINE III SHORT PEN) 31G X 8 MM MISC USE TO INJECT INSULIN  INTO THE SKIN DAILY 100 each 0   lanthanum (FOSRENOL) 1000 MG chewable tablet Chew by mouth.     omeprazole  (PRILOSEC) 20 MG capsule Take 1 capsule by mouth once daily 90 capsule 0   prednisoLONE acetate (PRED FORTE) 1 % ophthalmic suspension Place 1 drop into the right eye 4 (four) times daily.     rosuvastatin  (CRESTOR ) 10 MG tablet Take 1 tablet by mouth once daily 90 tablet 0   sevelamer  carbonate (RENVELA ) 800  MG tablet Take 2 tablets (1,600 mg total) by mouth 3 (three) times daily with meals.     No current facility-administered medications for this visit.     Objective:  BP 120/70   Pulse 90   Temp 98.1 F (36.7 C)   Ht 5' 8 (1.727 m)   Wt 222 lb 9.6 oz (101 kg)   SpO2 95%   BMI 33.85 kg/m  Gen: NAD, resting comfortably CV: RRR no rubs or gallops Lungs: CTAB no crackles, wheeze, rhonchi Ext: no edema Skin: warm, dry  Diabetic Foot Exam - Simple   Simple Foot Form Diabetic Foot exam was performed with the following findings: Yes 02/07/2023  4:21 PM  Visual Inspection No deformities, no ulcerations, no other skin breakdown bilaterally: Yes Sensation Testing See comments: Yes Pulse Check Posterior Tibialis and Dorsalis pulse intact  bilaterally: Yes Comments Slight callous buildup at base of MTP 1st toe both foot Reduced monofilament testing bilaterally- some at midfoot 1+ PT and DP pulses        Assessment and Plan   #Joint pain and stiffness- knees and ankles, sometimes elbow- likely arthritis-   #prior PSA elevation- encouraged follow up with urology. Biopsy desires lower with limited life expectaancy on dialysis  #End-stage renal disease on hemodialysis since January 2019. thorugh related to diabetes #Secondary hyperparathyroidism S: Patient on dialysis followed by Dr. Betsey on MWF at [facility] site - Dr. Macel more recently I believe -AV fistula 06/01/2016 by Dr. Oris A/P: overall stable- continue dialysis with nephrology  -Patient compliant with Sensipar  for secondary hyperparathyroidism  #Heart failure with preserved ejection fraction-follows with Dr. Delford RAMAN: Medication: lasix  in past, now managed by dialysis   Edema: no increase Weight gain:slight gain up 2 lbs from last visit Shortness of breath: no worsening A/P: heart failure appears euvolemic with dialysis- continue to monitor    #hypertension S: medication: none since being on dialysis A/P: controlled without medications- continue to monitor   #Aortic stenosis- mild 2022- plan for repeat with cardiology- sees them soon   # Diabetes with neuropathy S: Medication:basaglar  50 units, gabapentin  300mg  daily limit per dialysis-going to try 100 mg 3 times a day- no sedation thankfully CBGs- 101- 160 in morning  Lab Results  Component Value Date   HGBA1C 7.1 (A) 02/07/2023   HGBA1C 7.3 (A) 10/06/2022   HGBA1C 7.8 (A) 06/07/2022  A/P: diabetes has improved slightly on Point of Care (POC) test today- he will continue basaglar  50 units for now- we will add Ozempic  0.25 mg for 4 weeks then go up to 0.5 mg for 4 weeks. If he feels good on that dose we can continue or bump up to 1 mg- he will message me in about 6 weeks with how he is  feeling/doing -any sugar under 80 let me know and we will reduce insulin  -also hoping this helps with weight loss -for neuropathy- ongoing pain on gabapentin - imperfect but tolerable  #history of stroke- now on aspirin  81mg - stutter lingers from this #hyperlipidemia S: Medication: in 2023 back on rosuvastatin  10 mg  Lab Results  Component Value Date   CHOL 147 07/06/2021   HDL 36.10 (L) 07/06/2021   LDLDIRECT 41.0 07/06/2021   TRIG (H) 07/06/2021    529.0 Triglyceride is over 400; calculations on Lipids are invalid.   CHOLHDL 4 07/06/2021   A/P: lipids at goal for LDL but triglyceride(s) high last time- trying to get labs from nephrology again  #Varicose veins/venous insufficiency- prior blister healed thankfully  and not recurred  # GERD S:Medication: Omeprazole  20 mg A/P: stable- continue current medicines    # former smoker- get AAA scan at 64 . Offered lung cancer screening program - opts out again today  Recommended follow up: Return in about 4 months (around 06/07/2023) for followup or sooner if needed.Schedule b4 you leave. Future Appointments  Date Time Provider Department Center  02/28/2023  9:15 AM Delford Maude BROCKS, MD [facility]  04/24/2023 11:45 AM [facility] ANNUAL WELLNESS VISIT 1 [facility]    Lab/Order associations:   ICD-10-CM   1. Diabetes mellitus, type II, insulin  dependent (HCC)  E11.9 POCT HgB A1C   Z79.4     2. Chronic diastolic CHF (congestive heart failure) (HCC)  I50.32     3. ESRD (end stage renal disease) on dialysis (HCC)  N18.6    Z99.2     4. Essential hypertension  I10     5. Hyperlipidemia associated with type 2 diabetes mellitus (HCC) Chronic E11.69    E78.5       Meds ordered this encounter  Medications   Semaglutide ,0.25 or 0.5MG /DOS, (OZEMPIC , 0.25 OR 0.5 MG/DOSE,) 2 MG/3ML SOPN    Sig: Inject 0.25 mg into the skin once a week for 28 days, THEN 0.5 mg once a week for 28 days.    Dispense:  6 mL    Refill:  0     Return precautions advised.  Garnette Lukes, MD

## 2023-02-21 ENCOUNTER — Encounter (HOSPITAL_COMMUNITY): Payer: Self-pay

## 2023-02-23 NOTE — Progress Notes (Signed)
 CARDIOLOGY OFFICE NOTE  Date:  02/28/2023    Edgar Brewer Date of Birth: 04-29-1960 Medical Record #[id number]  PCP:  Katrinka Edgar KIDD, MD  Cardiologist:  Delford  No chief complaint on file.    History of Present Illness: Edgar Brewer is a 63 y.o. male who presents today for f/u diastolic CHF and mild AS He has CRF with Cr 7.4 Now on dialysis  Has left brachio cephalic fistula. Also with anemia, DM, neuropathy and LE Varicosities. History of anemia due to CRF EGD 04/05/16 duodenitis and colon with multiple polyps removed and Diverticular disease.   TTE 08/28/20 EF normal mean AV gradient only 9.5 mmhg   He is a big Teacher, Music to talk sports and thinks Charlena Cleaves stinks   10/04/18 Had laparoscopic cholecystectomy Re admitted with ? Seroma vs biloma or abscess started on antibiotics HIDA negative for bile leak but outpatient still with elevated alk phos and ESR 97 Seeing Dr Shila Readmitted for percutaneous drainage and more antibiotic Rx October 2020  05/22/20 admitted with dyspnea and ? LLE cellulitis Troponin 18 BNP 945 Dialysis intensified Venous duplex Negative for DVT CXR small left pleural effusion Cellulitis RX with Augmentin  and Doxycycline  Cardiology did  Not see patient during hospitalization   Very poor vision doesn't drive and has hard time reading Recent floaters worse Vitrectomy early 2024   Lives with mother and brother who drive him around Does not like to exercise   No cardiac complaints still able to read/watch TV  Past Medical History:  Diagnosis Date   Acute on chronic combined systolic and diastolic CHF, NYHA class 1 (HCC) 01/13/2015   Allergy    Arthritis    Chronic kidney disease    MWF [facility].   Diabetes mellitus type 2 with complications (HCC)    Previous insulin , non-insulin  requiring noted 09/2018.  Complications include retinopathy, peripheral neuropathy.   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)    History of kidney stones    Hypertension    Neuromuscular disorder (HCC)    Neuropathy    Pulmonary hypertension (HCC)    PA peak pressure 41 mmHg 03/04/16 echo   Stroke [facility])    no residual weakness   Varicose veins of lower extremities with complications 12/24/2014    Past Surgical History:  Procedure Laterality Date   AV FISTULA PLACEMENT Left 06/01/2016   Procedure: ARTERIOVENOUS (AV) FISTULA CREATION;  Surgeon: Oris Krystal FALCON, MD;  Location: [facility] OR;  Service: Vascular;  Laterality: Left;   AV FISTULA PLACEMENT Left 08/10/2017   Procedure: BRACHIOCEPHALIC ARTERIOVENOUS (AV) FISTULA CREATION LEFT UPPER EXTREMITY;  Surgeon: Eliza Lonni RAMAN, MD;  Location[facility] OR;  Service: Vascular;  Laterality: Left;   CHOLECYSTECTOMY N/A 10/04/2018   Procedure: LAPAROSCOPIC CHOLECYSTECTOMY;  Surgeon: Stevie Herlene Righter, MD;  Location: [facility] OR;  Service: General;  Laterality: N/A;   COLONOSCOPY W/ POLYPECTOMY  03/2016   For evaluation of iron deficiency anemia.  Dr. Shila.  10 polyps, largest 20 mm.  Pathology: tubular adenomas with no high-grade dysplasia, hyperplastic.  None bleeding internal hemorrhoids.  Pandiverticulosis.   CYSTOSCOPY WITH RETROGRADE PYELOGRAM, URETEROSCOPY AND STENT PLACEMENT Bilateral 02/25/2021   Procedure: CYSTOSCOPY WITH BILATERAL  RETROGRADE PYELOGRAM, RIGHT DIAGNOSTIC URETEROSCOPY,  BLADDER BIOPSY WITH FULGERATION;  Surgeon: Alvaro Hummer, MD;  Location: [facility] ORS;  Service: Urology;  Laterality: Bilateral;   ENDOVENOUS ABLATION SAPHENOUS VEIN W/ LASER Left 07-23-2015  endovenous laser ablation left greater saphenous vein by Krystal Doing MD   ENDOVENOUS ABLATION SAPHENOUS VEIN W/ LASER Right 10/15/2015   EVLA right greater saphenous vein by Krystal Doing MD   ESOPHAGOGASTRODUODENOSCOPY  03/2016   For evaluation of IDA.  Dr. Shila.  Duodenal erythema, pathology benign mucosa, no villous atrophy.   GALLBLADDER SURGERY  10/2018   HOLMIUM LASER  APPLICATION Right 02/25/2021   Procedure: HOLMIUM LASER OF TUMOR;  Surgeon: Alvaro Hummer, MD;  Location: [facility] ORS;  Service: Urology;  Laterality: Right;   IR RADIOLOGIST EVAL & MGMT  11/29/2018   TEE WITHOUT CARDIOVERSION N/A 11/19/2018   Procedure: TRANSESOPHAGEAL ECHOCARDIOGRAM (TEE);  Surgeon: Alveta Aleene PARAS, MD;  Location: [facility] ENDOSCOPY;  Service: Cardiovascular;  Laterality: N/A;     Medications: Current Outpatient Medications  Medication Sig Dispense Refill   aspirin  EC 81 MG tablet Take 81 mg by mouth daily. Swallow whole.     b complex-vitamin c -folic acid  (NEPHRO-VITE) 0.8 MG TABS tablet Take 1 tablet by mouth in the morning.     cinacalcet  (SENSIPAR ) 60 MG tablet Take 60 mg by mouth every Tuesday, Thursday, and Saturday at 6 PM. In the evening.     fluticasone  (FLONASE ) 50 MCG/ACT nasal spray Place 2 sprays into both nostrils daily. 16 g 3   gabapentin  (NEURONTIN ) 100 MG capsule Take 1 capsule (100 mg total) by mouth 3 (three) times daily. Substitute for 300mg  once a day- patient prefers to space out dose 270 capsule 3   Insulin  Glargine (BASAGLAR  KWIKPEN) 100 UNIT/ML Inject 50 Units into the skin daily. 15 mL 11   Insulin  Pen Needle (B-D ULTRAFINE III SHORT PEN) 31G X 8 MM MISC USE TO INJECT INSULIN  INTO THE SKIN DAILY 100 each 0   lanthanum (FOSRENOL) 1000 MG chewable tablet Chew by mouth.     omeprazole  (PRILOSEC) 20 MG capsule Take 1 capsule by mouth once daily 90 capsule 0   prednisoLONE acetate (PRED FORTE) 1 % ophthalmic suspension Place 1 drop into the right eye 4 (four) times daily.     rosuvastatin  (CRESTOR ) 10 MG tablet Take 1 tablet by mouth once daily 90 tablet 0   sevelamer  carbonate (RENVELA ) 800 MG tablet Take 2 tablets (1,600 mg total) by mouth 3 (three) times daily with meals.     Semaglutide ,0.25 or 0.5MG /DOS, (OZEMPIC , 0.25 OR 0.5 MG/DOSE,) 2 MG/3ML SOPN Inject 0.25 mg into the skin once a week for 28 days, THEN 0.5 mg once a week for 28 days. (Patient not taking:  Reported on 02/28/2023) 6 mL 0   No current facility-administered medications for this visit.    Allergies: Allergies  Allergen Reactions   Codeine Anaphylaxis, Hives, Swelling and Other (See Comments)    Swelling all over body and the throat   Clindamycin /Lincomycin Nausea And Vomiting    Social History: The patient  reports that he quit smoking about 8 years ago. His smoking use included cigarettes. He started smoking about 53 years ago. He has a 45 pack-year smoking history. He has never used smokeless tobacco. He reports current alcohol use of about 1.0 standard drink of alcohol per week. He reports that he does not use drugs.   Family History: The patient's family history includes Arthritis in his mother; Hearing loss in his father; Heart disease in his maternal grandmother; Hyperlipidemia in his maternal grandmother; Lung cancer in his maternal grandfather; Prostate cancer in his father.   Review of Systems: Please see the history of present illness.  Otherwise, the review of systems is positive for none.   All other systems are reviewed and negative.   Physical Exam: VS:  BP 124/60   Pulse 80   Ht 5' 8 (1.727 m)   Wt 219 lb (99.3 kg)   SpO2 99%   BMI 33.30 kg/m  .  BMI Body mass index is 33.3 kg/m.  Wt Readings from Last 3 Encounters:  02/28/23 219 lb (99.3 kg)  02/07/23 222 lb 9.6 oz (101 kg)  10/06/22 220 lb 3.2 oz (99.9 kg)    Affect appropriate Healthy:  appears stated age HEENT: normal Neck supple with no adenopathy JVP normal no bruits no thyromegaly Lungs clear with no wheezing and good diaphragmatic motion Heart:  S1/S2 preserved mild AS murmur, no rub, gallop or click PMI normal Abdomen: benighn, BS positve, no tenderness, no AAA no bruit.  No HSM or HJR Large left upper arm fistula with thrill  No edema Neuro non-focal Skin warm and dry No muscular weakness   LABORATORY DATA:  EKG:   02/28/2023 NSR with nonspecific T wave changes. PR 212 msec     Lab Results  Component Value Date   WBC 6.8 07/06/2021   HGB 13.2 07/06/2021   HCT 38.4 (L) 07/06/2021   PLT 163.0 07/06/2021   GLUCOSE 386 (H) 07/06/2021   CHOL 147 07/06/2021   TRIG (H) 07/06/2021    529.0 Triglyceride is over 400; calculations on Lipids are invalid.   HDL 36.10 (L) 07/06/2021   LDLDIRECT 41.0 07/06/2021   ALT 31 07/06/2021   AST 20 07/06/2021   NA 133 (L) 07/06/2021   K 5.2 (H) 07/06/2021   CL 90 (L) 07/06/2021   CREATININE 8.03 (HH) 07/06/2021   BUN 41 (H) 07/06/2021   CO2 28 07/06/2021   TSH 1.97 12/10/2020   PSA 4.22 (H) 07/06/2021   INR 1.0 11/14/2018   HGBA1C 7.1 (A) 02/07/2023    BNP (last 3 results) No results for input(s): BNP in the last 8760 hours.   ProBNP (last 3 results) No results for input(s): PROBNP in the last 8760 hours.   Other Studies Reviewed Today:  Myoview  Study Highlights from 05/2014   Myocardial perfusion is normal. The study is abormal. This is an intermediate risk study. Overall left ventricular systolic function was abnormal. LV cavity size is normal. The left ventricular ejection fraction is moderately decreased (42%). There is no prior study for comparison.   Notes Recorded by Maude JAYSON Emmer, MD on 06/25/2014 at 8:31 AM Increase cozaar  to 100 mg decrease lasix  to once/day  F/u with me to discuss cath EF low   Echo Study Conclusions from 05/2014   - Left ventricle: The cavity size was mildly dilated. Wall   thickness was increased in a pattern of mild LVH. Systolic   function was mildly reduced. The estimated ejection fraction was   in the range of 45% to 50%. Diffuse hypokinesis. Doppler   parameters are consistent with elevated ventricular end-diastolic   filling pressure. - Mitral valve: There was mild regurgitation. - Left atrium: The atrium was mildly dilated. - Atrial septum: No defect or patent foramen ovale was identified. - Pulmonary arteries: PA peak pressure: 52 mm Hg (S). - Pericardium,  extracardiac: Small to moderate circumferential   pericardial effusion no tamponade.    Assessment/Plan: 1. Anemia:  Multiple polyps removed f/u GI and CKD f/u primary   2. Chronic diastolic HF -  related to CRF on dialysis Hospitalization April 2022 improved with  dialysis   Myovue done 08/25/20 non ischemic EF 54%  3. Varicose veins/venous insufficiency - chronic. Refer back to [facility] prior patient  Dr Oris ? Sclerosing/ablative Rx  4. CKD -   On dialysis  AV fistula done 06/01/16 by Dr Early F/U nephrology  5. DM - Discussed low carb diet.  Target hemoglobin A1c is 6.5 or less.  Continue current medications. A1c in September poor control 8.4   6. Prior tobacco abuse - tells me he is not smoking CT 11/15/18 no cancer  7. Aortic Stenosis :  Very mild considering fistula with mean gradient  stable 9.2 on TTE 08/27/20 will update next year   8. GI:  F/U Dr Shila post cholecystectomy with rehospitalization for elevated alk phos  And percutaneous aspiration of gallbladder fossa Rx Klebsiella, Ecoli and Enterococcus bacteremia   9.  CAD:  normal myovue 08/25/20 no ischemia EF 54%   TTE for AS in a year  F/U in a year   Maude Emmer

## 2023-02-28 ENCOUNTER — Encounter: Payer: Self-pay | Admitting: Cardiovascular Disease

## 2023-02-28 ENCOUNTER — Encounter (HOSPITAL_COMMUNITY): Payer: Self-pay

## 2023-02-28 ENCOUNTER — Ambulatory Visit: Payer: Medicare (Managed Care) | Attending: Cardiovascular Disease | Admitting: Cardiovascular Disease

## 2023-02-28 VITALS — BP 124/60 | HR 80 | Ht 68.0 in | Wt 219.0 lb

## 2023-02-28 DIAGNOSIS — I739 Peripheral vascular disease, unspecified: Secondary | ICD-10-CM | POA: Diagnosis not present

## 2023-02-28 DIAGNOSIS — I5032 Chronic diastolic (congestive) heart failure: Secondary | ICD-10-CM

## 2023-02-28 DIAGNOSIS — N185 Chronic kidney disease, stage 5: Secondary | ICD-10-CM

## 2023-02-28 DIAGNOSIS — I350 Nonrheumatic aortic (valve) stenosis: Secondary | ICD-10-CM | POA: Diagnosis not present

## 2023-02-28 NOTE — Patient Instructions (Addendum)
 Medication Instructions:  Your physician recommends that you continue on your current medications as directed. Please refer to the Current Medication list given to you today.  *If you need a refill on your cardiac medications before your next appointment, please call your pharmacy*  Lab Work: If you have labs (blood work) drawn today and your tests are completely normal, you will receive your results only by: MyChart Message (if you have MyChart) OR A paper copy in the mail If you have any lab test that is abnormal or we need to change your treatment, we will call you to review the results.  Follow-Up: At [facility], you and your health needs are our priority.  As part of our continuing mission to provide you with exceptional heart care, we have created designated Provider Care Teams.  These Care Teams include your primary Cardiologist (physician) and Advanced Practice Providers (APPs -  Physician Assistants and Nurse Practitioners) who all work together to provide you with the care you need, when you need it.  We recommend signing up for the patient portal called "MyChart".  Sign up information is provided on this After Visit Summary.  MyChart is used to connect with patients for Virtual Visits (Telemedicine).  Patients are able to view lab/test results, encounter notes, upcoming appointments, etc.  Non-urgent messages can be sent to your provider as well.   To learn more about what you can do with MyChart, go to [email or web address].    Your next appointment:   1 year(s)  Provider:   Charlton Haws, MD     Other Instructions    1st Floor: - Lobby - Registration  - Pharmacy  - Lab - Cafe  2nd Floor: - PV Lab - Diagnostic Testing (echo, CT, nuclear med)  3rd Floor: - Vacant  4th Floor: - TCTS (cardiothoracic surgery) - AFib Clinic - Structural Heart Clinic - Vascular Surgery  - Vascular Ultrasound  5th Floor: - [facility]Care Cardiology (general and EP) -  Clinical Pharmacy for coumadin, hypertension, lipid, weight-loss medications, and med management appointments    Valet parking services will be available as well.

## 2023-03-17 ENCOUNTER — Encounter (HOSPITAL_COMMUNITY): Payer: Self-pay

## 2023-03-17 ENCOUNTER — Other Ambulatory Visit (HOSPITAL_COMMUNITY): Payer: Self-pay

## 2023-03-17 ENCOUNTER — Telehealth: Payer: Self-pay

## 2023-03-17 NOTE — Telephone Encounter (Signed)
Pharmacy Patient Advocate Encounter   Received notification from Pt Calls Messages that prior authorization for Basaglar is required/requested.   Insurance verification completed.   The patient is insured through  [employer] Part D  .   Per test claim:  Elon Jester is not on the formulary.   Lantus Solostar copay $12.15   Can the patient be changed to Lantus?   Please advise.

## 2023-03-17 NOTE — Telephone Encounter (Signed)
Received from pharmacy pt needing PA on Basaglar 100unit.

## 2023-03-20 MED ORDER — LANTUS SOLOSTAR 100 UNIT/ML ~~LOC~~ SOPN: 50.0000 [IU] | PEN_INJECTOR | Freq: Every day | SUBCUTANEOUS | 5 refills | Status: DC

## 2023-03-20 NOTE — Telephone Encounter (Signed)
 Rx changed, lm on pt vm tcb. When pt cb please give him message below.

## 2023-03-20 NOTE — Telephone Encounter (Signed)
 See below

## 2023-03-20 NOTE — Telephone Encounter (Signed)
 Yes may change Basaglar to Lantus with the same directions.  Please inform patient it was changed to help save him money and that he can finish his Basaglar and then start the Lantus but important not to run out

## 2023-04-12 ENCOUNTER — Other Ambulatory Visit: Payer: Self-pay | Admitting: Family Medicine

## 2023-04-14 ENCOUNTER — Encounter (HOSPITAL_COMMUNITY): Payer: Self-pay

## 2023-04-14 ENCOUNTER — Emergency Department (HOSPITAL_BASED_OUTPATIENT_CLINIC_OR_DEPARTMENT_OTHER)
Admission: EM | Admit: 2023-04-14 | Discharge: 2023-04-14 | Disposition: A | Discharge status: Home / Self Care | Payer: Medicare (Managed Care) | Attending: Emergency Medicine | Admitting: Emergency Medicine

## 2023-04-14 ENCOUNTER — Encounter (HOSPITAL_BASED_OUTPATIENT_CLINIC_OR_DEPARTMENT_OTHER): Payer: Self-pay | Admitting: Emergency Medicine

## 2023-04-14 ENCOUNTER — Emergency Department (HOSPITAL_BASED_OUTPATIENT_CLINIC_OR_DEPARTMENT_OTHER): Payer: Medicare (Managed Care)

## 2023-04-14 ENCOUNTER — Ambulatory Visit: Payer: Self-pay

## 2023-04-14 ENCOUNTER — Other Ambulatory Visit: Payer: Self-pay

## 2023-04-14 DIAGNOSIS — I509 Heart failure, unspecified: Secondary | ICD-10-CM | POA: Insufficient documentation

## 2023-04-14 DIAGNOSIS — E1122 Type 2 diabetes mellitus with diabetic chronic kidney disease: Secondary | ICD-10-CM | POA: Insufficient documentation

## 2023-04-14 DIAGNOSIS — R059 Cough, unspecified: Secondary | ICD-10-CM | POA: Diagnosis not present

## 2023-04-14 DIAGNOSIS — N186 End stage renal disease: Secondary | ICD-10-CM | POA: Diagnosis not present

## 2023-04-14 DIAGNOSIS — R112 Nausea with vomiting, unspecified: Secondary | ICD-10-CM | POA: Insufficient documentation

## 2023-04-14 DIAGNOSIS — R197 Diarrhea, unspecified: Secondary | ICD-10-CM | POA: Diagnosis not present

## 2023-04-14 DIAGNOSIS — I132 Hypertensive heart and chronic kidney disease with heart failure and with stage 5 chronic kidney disease, or end stage renal disease: Secondary | ICD-10-CM | POA: Diagnosis not present

## 2023-04-14 DIAGNOSIS — Z7982 Long term (current) use of aspirin: Secondary | ICD-10-CM | POA: Diagnosis not present

## 2023-04-14 DIAGNOSIS — Z794 Long term (current) use of insulin: Secondary | ICD-10-CM | POA: Diagnosis not present

## 2023-04-14 DIAGNOSIS — R1084 Generalized abdominal pain: Secondary | ICD-10-CM | POA: Diagnosis not present

## 2023-04-14 DIAGNOSIS — Z992 Dependence on renal dialysis: Secondary | ICD-10-CM | POA: Insufficient documentation

## 2023-04-14 DIAGNOSIS — R109 Unspecified abdominal pain: Secondary | ICD-10-CM

## 2023-04-14 LAB — CBC WITH DIFFERENTIAL/PLATELET
Abs Immature Granulocytes: 0.06 10*3/uL (ref 0.00–0.07)
Basophils Absolute: 0 10*3/uL (ref 0.0–0.1)
Basophils Relative: 1 %
Eosinophils Absolute: 0.1 10*3/uL (ref 0.0–0.5)
Eosinophils Relative: 1 %
HCT: 41.2 % (ref 39.0–52.0)
Hemoglobin: 14.1 g/dL (ref 13.0–17.0)
Immature Granulocytes: 1 %
Lymphocytes Relative: 10 %
Lymphs Abs: 0.8 10*3/uL (ref 0.7–4.0)
MCH: 30.5 pg (ref 26.0–34.0)
MCHC: 34.2 g/dL (ref 30.0–36.0)
MCV: 89 fL (ref 80.0–100.0)
Monocytes Absolute: 0.5 10*3/uL (ref 0.1–1.0)
Monocytes Relative: 6 %
Neutro Abs: 6.3 10*3/uL (ref 1.7–7.7)
Neutrophils Relative %: 81 %
Platelets: 218 10*3/uL (ref 150–400)
RBC: 4.63 MIL/uL (ref 4.22–5.81)
RDW: 13.6 % (ref 11.5–15.5)
WBC: 7.7 10*3/uL (ref 4.0–10.5)
nRBC: 0 % (ref 0.0–0.2)

## 2023-04-14 LAB — COMPREHENSIVE METABOLIC PANEL
ALT: 27 U/L (ref 0–44)
AST: 23 U/L (ref 15–41)
Albumin: 4.7 g/dL (ref 3.5–5.0)
Alkaline Phosphatase: 252 U/L — ABNORMAL HIGH (ref 38–126)
Anion gap: 16 — ABNORMAL HIGH (ref 5–15)
BUN: 49 mg/dL — ABNORMAL HIGH (ref 8–23)
CO2: 25 mmol/L (ref 22–32)
Calcium: 10.7 mg/dL — ABNORMAL HIGH (ref 8.9–10.3)
Chloride: 89 mmol/L — ABNORMAL LOW (ref 98–111)
Creatinine, Ser: 10.17 mg/dL — ABNORMAL HIGH (ref 0.61–1.24)
GFR, Estimated: 5 mL/min — ABNORMAL LOW (ref >60)
Glucose, Bld: 222 mg/dL — ABNORMAL HIGH (ref 70–99)
Potassium: 5.2 mmol/L — ABNORMAL HIGH (ref 3.5–5.1)
Sodium: 130 mmol/L — ABNORMAL LOW (ref 135–145)
Total Bilirubin: 0.6 mg/dL (ref 0.0–1.2)
Total Protein: 7.4 g/dL (ref 6.5–8.1)

## 2023-04-14 LAB — LIPASE, BLOOD: Lipase: 28 U/L (ref 11–51)

## 2023-04-14 MED ORDER — PROMETHAZINE HCL 25 MG/ML IJ SOLN
INTRAMUSCULAR | Status: AC
  Filled 2023-04-14: qty 1

## 2023-04-14 MED ORDER — SODIUM CHLORIDE 0.9 % IV SOLN
25.0000 mg | Freq: Once | INTRAVENOUS | Status: AC
  Administered 2023-04-14: 25 mg via INTRAVENOUS
  Filled 2023-04-14: qty 1

## 2023-04-14 MED ORDER — SODIUM CHLORIDE 0.9 % IV BOLUS
500.0000 mL | Freq: Once | INTRAVENOUS | Status: AC
  Administered 2023-04-14: 500 mL via INTRAVENOUS

## 2023-04-14 MED ORDER — ONDANSETRON HCL 4 MG/2ML IJ SOLN
4.0000 mg | Freq: Once | INTRAMUSCULAR | Status: AC
  Administered 2023-04-14: 4 mg via INTRAVENOUS
  Filled 2023-04-14: qty 2

## 2023-04-14 MED ORDER — ONDANSETRON HCL 4 MG PO TABS: 4.0000 mg | ORAL_TABLET | Freq: Four times a day (QID) | ORAL | 0 refills | Status: DC

## 2023-04-14 MED ORDER — FENTANYL CITRATE PF 50 MCG/ML IJ SOSY
50.0000 ug | PREFILLED_SYRINGE | Freq: Once | INTRAMUSCULAR | Status: DC
  Filled 2023-04-14 (×2): qty 1

## 2023-04-14 NOTE — ED Notes (Signed)
 Pt aware of the need for a urine... Unable to currently provide the sample.Marland KitchenMarland Kitchen

## 2023-04-14 NOTE — Telephone Encounter (Signed)
  Chief Complaint: vomiting Symptoms: nausea, vomiting, dry mouth, lightheaded Frequency: x 4 days Pertinent Negatives: Patient denies fever, abdominal pain, blood in vomit or stool Disposition: [x] ED /[] Urgent Care (no appt availability in office) / [] Appointment(In office/virtual)/ []  [facility]/ [] Home Care/ [] Refused Recommended Disposition /[] [facility]/ []  Follow-up with PCP Additional Notes: Patient's mother reports nausea and vomiting since 04/11/23, and went to dialysis on 04/12/23. Diarrhea began last night. Patient can not keep down water or ginger ale, has been able to eat a little apple sauce. Patient is with mother on phone for triage. He has not checked his blood sugar the past few days due to feeling unwell. Mother agreeable to take patient to ED.   Copied from CRM [id number]. Topic: Clinical - Red Word Triage >> Apr 14, 2023  8:40 AM Adele Barthel wrote: Red Word that prompted transfer to Nurse Triage:   Dialysis patient Vomiting on and off since Tues after his dialysis appt Now has diarrhea Scheduled for dialysis appt Reason for Disposition  [1] MODERATE vomiting (e.g., 3 - 5 times/day) AND [2] age > 60 years  Answer Assessment - Initial Assessment Questions 1. VOMITING SEVERITY: "How many times have you vomited in the past 24 hours?"     - MILD:  1 - 2 times/day    - MODERATE: 3 - 5 times/day, decreased oral intake without significant weight loss or symptoms of dehydration    - SEVERE: 6 or more times/day, vomits everything or nearly everything, with significant weight loss, symptoms of dehydration      She is unsure but states she thinks it is more than 5 times, states anytime he eats or drinks he vomits.  2. ONSET: "When did the vomiting begin?"      04/11/23.  3. FLUIDS: "What fluids or food have you vomited up today?" "Have you been able to keep any fluids down?"     Mother states the only thing he has been able to keep down is applesauce. She states  he has thrown up water and ginger ale.  4. ABDOMEN PAIN: "Are your having any abdomen pain?" If Yes : "How bad is it and what does it feel like?" (e.g., crampy, dull, intermittent, constant)      Denies.  5. DIARRHEA: "Is there any diarrhea?" If Yes, ask: "How many times today?"      Yes, started last night. She estimates 2 episodes of diarrhea.  6. CONTACTS: "Is there anyone else in the family with the same symptoms?"      Denies.  7. CAUSE: "What do you think is causing your vomiting?"     Unsure.  8. HYDRATION STATUS: "Any signs of dehydration?" (e.g., dry mouth [not only dry lips], too weak to stand) "When did you last urinate?"     Due to dialysis his baseline is that he does not make much urine so difficult to assess.   9. OTHER SYMPTOMS: "Do you have any other symptoms?" (e.g., fever, headache, vertigo, vomiting blood or coffee grounds, recent head injury)     Denies.  10. PREGNANCY: "Is there any chance you are pregnant?" "When was your last menstrual period?"       N/A.  Protocols used: Vomiting-A-AH

## 2023-04-14 NOTE — ED Provider Notes (Signed)
 [place] EMERGENCY DEPARTMENT AT [facility] Provider Note   CSN: [id number] Arrival date & time: 04/14/23  1008     History  No chief complaint on file.   Edgar Brewer is a 63 y.o. male.  The history is provided by the patient, the spouse and medical records. No language interpreter was used.  Abdominal Pain Pain location:  Generalized Pain quality: aching, bloating and cramping   Pain radiates to:  Does not radiate Pain severity:  Moderate Onset quality:  Gradual Duration:  4 days Timing:  Constant Progression:  Waxing and waning Chronicity:  New Context: previous surgery   Relieved by:  Nothing Worsened by:  Palpation Ineffective treatments:  None tried Associated symptoms: cough (mild), diarrhea, nausea and vomiting   Associated symptoms: no chest pain, no chills, no constipation, no fatigue, no fever, no flatus and no shortness of breath   Risk factors: multiple surgeries        Home Medications Prior to Admission medications   Medication Sig Start Date End Date Taking? Authorizing Provider  aspirin EC 81 MG tablet Take 81 mg by mouth daily. Swallow whole.    [provider]  b complex-vitamin c-folic acid (NEPHRO-VITE) 0.8 MG TABS tablet Take 1 tablet by mouth in the morning.    [provider]  cinacalcet (SENSIPAR) 60 MG tablet Take 60 mg by mouth every Tuesday, Thursday, and Saturday at 6 PM. In the evening.    [provider]  fluticasone (FLONASE) 50 MCG/ACT nasal spray Place 2 sprays into both nostrils daily. 02/10/22   Shelva Majestic, MD  gabapentin (NEURONTIN) 100 MG capsule Take 1 capsule (100 mg total) by mouth 3 (three) times daily. Substitute for 300mg  once a day- patient prefers to space out dose 06/07/22   Shelva Majestic, MD  Insulin Glargine [facility]) 100 UNIT/ML Inject 50 Units into the skin daily. 06/07/22   Shelva Majestic, MD  insulin glargine (LANTUS SOLOSTAR) 100 UNIT/ML Solostar Pen Inject 50  Units into the skin daily. 03/20/23   Shelva Majestic, MD  Insulin Pen Needle (B-D ULTRAFINE III SHORT PEN) 31G X 8 MM MISC USE TO INJECT INSULIN INTO THE SKIN DAILY 12/05/22   Shelva Majestic, MD  lanthanum (FOSRENOL) 1000 MG chewable tablet Chew by mouth. 05/24/21   [provider]  omeprazole (PRILOSEC) 20 MG capsule Take 1 capsule by mouth once daily 04/12/23   Shelva Majestic, MD  prednisoLONE acetate (PRED FORTE) 1 % ophthalmic suspension Place 1 drop into the right eye 4 (four) times daily. 03/17/22   [provider]  rosuvastatin (CRESTOR) 10 MG tablet Take 1 tablet by mouth once daily 01/30/23   Shelva Majestic, MD  sevelamer carbonate (RENVELA) 800 MG tablet Take 2 tablets (1,600 mg total) by mouth 3 (three) times daily with meals. 10/22/18   Glade Lloyd, MD      Allergies    Codeine and Clindamycin/lincomycin    Review of Systems   Review of Systems  Constitutional:  Negative for chills, diaphoresis, fatigue and fever.  HENT:  Negative for congestion.   Respiratory:  Positive for cough (mild). Negative for chest tightness, shortness of breath and wheezing.   Cardiovascular:  Negative for chest pain, palpitations and leg swelling.  Gastrointestinal:  Positive for abdominal distention, abdominal pain, diarrhea, nausea and vomiting. Negative for anal bleeding, blood in stool, constipation and flatus.  Genitourinary:  Positive for decreased urine volume. Negative for flank pain.  Musculoskeletal:  Negative for back pain and neck pain.  Skin:  Negative for rash and wound.  Neurological:  Negative for light-headedness and headaches.  Psychiatric/Behavioral:  Negative for agitation.   All other systems reviewed and are negative.   Physical Exam Updated Vital Signs BP 127/71   Pulse 88   Temp 98.1 F (36.7 C) (Oral)   Resp 16   Wt 98.9 kg   SpO2 96%   BMI 33.15 kg/m  Physical Exam Vitals and nursing note reviewed.  Constitutional:      General: He is  not in acute distress.    Appearance: He is well-developed. He is not ill-appearing, toxic-appearing or diaphoretic.  HENT:     Head: Normocephalic and atraumatic.     Nose: No congestion or rhinorrhea.     Mouth/Throat:     Mouth: Mucous membranes are dry.     Pharynx: No oropharyngeal exudate or posterior oropharyngeal erythema.  Eyes:     Extraocular Movements: Extraocular movements intact.     Conjunctiva/sclera: Conjunctivae normal.     Pupils: Pupils are equal, round, and reactive to light.  Cardiovascular:     Rate and Rhythm: Normal rate and regular rhythm.     Pulses: Normal pulses.     Heart sounds: No murmur heard. Pulmonary:     Effort: Pulmonary effort is normal. No respiratory distress.     Breath sounds: Normal breath sounds. No wheezing, rhonchi or rales.  Chest:     Chest wall: No tenderness.  Abdominal:     General: Abdomen is protuberant. Bowel sounds are normal.     Palpations: Abdomen is soft.     Tenderness: There is abdominal tenderness. There is no right CVA tenderness, left CVA tenderness, guarding or rebound.  Musculoskeletal:        General: No swelling or tenderness.     Cervical back: Neck supple.     Right lower leg: No edema.     Left lower leg: No edema.  Skin:    General: Skin is warm and dry.     Capillary Refill: Capillary refill takes less than 2 seconds.     Findings: No erythema or rash.  Neurological:     General: No focal deficit present.     Mental Status: He is alert.  Psychiatric:        Mood and Affect: Mood normal.     ED Results / Procedures / Treatments   Labs (all labs ordered are listed, but only abnormal results are displayed) Labs Reviewed  COMPREHENSIVE METABOLIC PANEL - Abnormal; Notable for the following components:      Result Value   Sodium 130 (*)    Potassium 5.2 (*)    Chloride 89 (*)    Glucose, Bld 222 (*)    BUN 49 (*)    Creatinine, Ser 10.17 (*)    Calcium 10.7 (*)    Alkaline Phosphatase 252 (*)     GFR, Estimated 5 (*)    Anion gap 16 (*)    All other components within normal limits  CBC WITH DIFFERENTIAL/PLATELET  LIPASE, BLOOD  LACTIC ACID, PLASMA  LACTIC ACID, PLASMA  URINALYSIS, W/ REFLEX TO CULTURE (INFECTION SUSPECTED)    EKG None  Radiology CT ABDOMEN PELVIS WO CONTRAST Result Date: 04/14/2023 CLINICAL DATA:  Abdominal distension, nausea stopping and atypical loose stools. Bowel obstruction suspected. Dialysis patient. EXAM: CT ABDOMEN AND PELVIS WITHOUT CONTRAST TECHNIQUE: Multidetector CT imaging of the abdomen and pelvis was performed following the standard  protocol without IV contrast. RADIATION DOSE REDUCTION: This exam was performed according to the departmental dose-optimization program which includes automated exposure control, adjustment of the mA and/or kV according to patient size and/or use of iterative reconstruction technique. COMPARISON:  CT head without contrast 01/29/2021. FINDINGS: Lower chest: The lung bases are clear without focal nodule, mass, or airspace disease. Coronary artery calcifications scratched at the heart size is normal. Coronary artery calcifications are present. No significant pleural or pericardial effusion is present. Hepatobiliary: No focal liver abnormality is seen. Status post cholecystectomy. No biliary dilatation. Pancreas: Unremarkable. No pancreatic ductal dilatation or surrounding inflammatory changes. Spleen: Mild splenic enlargement is stable. No discrete lesions are present. Adrenals/Urinary Tract: Adrenal glands are normal bilaterally. Mild renal atrophy is noted bilaterally. No discrete renal lesions are present. No obstruction is present. The ureters are within normal limits bilaterally. The urinary bladder is normal. Stomach/Bowel: Stomach and duodenum are within normal limits. The small bowel is unremarkable. The terminal ileum is within normal limits. Appendix is visualized and normal. Diverticular changes are present in the  ascending and transverse colon without focal inflammation or obstruction. Diverticular changes are present in the descending and sigmoid colon without focal inflammation or obstruction. Vascular/Lymphatic: Atherosclerotic calcifications are present in the aorta and branch vessels. No aneurysm is present. No significant adenopathy is present. Reproductive: Prostatic enlargement is stable. The prostate measures 4.9 cm in transverse diameter. Other: No abdominal wall hernia or abnormality. No abdominopelvic ascites. Musculoskeletal: Mild multilevel degenerative changes of the thoracolumbar spine are stable. The bony pelvis is normal. The hips are located and within normal limits bilaterally. IMPRESSION: 1. No acute or focal lesion to explain the patient's symptoms. 2. Diverticulosis without diverticulitis. 3. Stable mild splenic enlargement. 4. Stable mild renal atrophy bilaterally. 5. Stable prostatic enlargement. 6. Coronary artery disease. 7.  Aortic Atherosclerosis (ICD10-I70.0). Electronically Signed   By: Marin Roberts M.D.   On: 04/14/2023 11:40   DG Chest Portable 1 View Result Date: 04/14/2023 CLINICAL DATA:  Cough. EXAM: PORTABLE CHEST 1 VIEW COMPARISON:  01/29/2021 FINDINGS: The heart size and mediastinal contours are within normal limits. Both lungs are clear. The visualized skeletal structures are unremarkable. IMPRESSION: No active disease. Electronically Signed   By: Danae Orleans M.D.   On: 04/14/2023 11:04    Procedures Procedures    Medications Ordered in ED Medications  fentaNYL (SUBLIMAZE) injection 50 mcg (50 mcg Intravenous Patient Refused/Not Given 04/14/23 1527)  promethazine (PHENERGAN) 25 MG/ML injection (  Not Given 04/14/23 1513)  sodium chloride 0.9 % bolus 500 mL (0 mLs Intravenous Stopped 04/14/23 1307)  ondansetron (ZOFRAN) injection 4 mg (4 mg Intravenous Given 04/14/23 1121)  promethazine (PHENERGAN) 25 mg in sodium chloride 0.9 % 50 mL IVPB (25 mg Intravenous New  Bag/Given 04/14/23 1512)  sodium chloride 0.9 % bolus 500 mL (500 mLs Intravenous New Bag/Given 04/14/23 1511)    ED Course/ Medical Decision Making/ A&P                                 Medical Decision Making Amount and/or Complexity of Data Reviewed Labs: ordered. Radiology: ordered.  Risk Prescription drug management.    Edgar Brewer is a 63 y.o. male with a past medical history significant for ESRD on dialysis MWF, CHF, diabetes, hypertension, previous gallbladder abscess status post surgery, and previous stroke who presents with abdominal pain, distention, bloating, nausea, vomiting, and stool changes.  According to patient,  for the last 4 days he has had pain across his abdomen with some distention.  He has had nausea and vomiting has not been able keep anything down.  He is not keeping fluids down.  He reports he has had several loose stools but no blood in his vomit or stools.  He reports abdominal distention and discomfort that is moderate in severity.  He denies any trauma.  He reports his urine is less.  He has not gone to dialysis yet today.  He denies any fevers, chills, chest pain, or shortness of breath.  He does report a dry cough.  Denies any back or flank pain.  Denies any other complaints.  Denies sick contacts.  On exam, lungs clear.  Chest nontender.  Abdomen is tender to palpation diffusely I did hear some bowel sounds.  Back and flanks nontender.  Legs are nontender and nonedematous.  No murmur on my exam.  Given the patient's distention, vomiting, then stool changes with his history of previous abdominal surgery, it is not unreasonable to get a CT of the pelvis to look for bowel obstruction, diverticulitis or other cause.  As he does still make urine despite dialysis, we will hold on contrast to not hurt the kidneys.  Will give him some fluids as I do think he looks dehydrated with dry mucous membranes.  Will check labs.  Anticipate reassessment after workup to determine  disposition.  2:55 PM Patient CT returned without acute abnormality and labs were overall reassuring.  Patient still having nausea vomiting and pain.  Suspect viral gastroenteritis causing this however given his ill appearance will give some more fluids and some more nausea and pain medicine.  If patient gets to where he can tolerate p.o., anticipate discharge home however he may need more rehydration now.         Final Clinical Impression(s) / ED Diagnoses Final diagnoses:  Nausea vomiting and diarrhea  Abdominal pain, unspecified abdominal location    Clinical Impression: 1. Nausea vomiting and diarrhea   2. Abdominal pain, unspecified abdominal location     Disposition: Care transferred to oncoming team to await reassessment after rehydration.  Anticipate discharge if patient is feeling better.  This note was prepared with assistance of Conservation officer, historic buildings. Occasional wrong-word or sound-a-like substitutions may have occurred due to the inherent limitations of voice recognition software.      Ronnett Pullin, Canary Brim, MD 04/14/23 1536

## 2023-04-14 NOTE — ED Provider Notes (Signed)
  Physical Exam  BP (!) 143/74 (BP Location: Right Arm)   Pulse 97   Temp 98.1 F (36.7 C) (Oral)   Resp 16   Wt 98.9 kg   SpO2 96%   BMI 33.15 kg/m   Physical Exam  Procedures  Procedures  ED Course / MDM    Medical Decision Making Amount and/or Complexity of Data Reviewed Labs: ordered. Radiology: ordered.  Risk Prescription drug management.   Wardell Honour, have assumed care for this patient.  In brief this is a 63 year old male signed out pending reassessment of symptoms.  Patient dialysis patient, is been having some nausea vomiting diarrhea over the last couple of days.  Imaging negative.  Patient feeling quite a bit better.  Will discharge him with Zofran.       Anders Simmonds T, DO 04/14/23 1740

## 2023-04-14 NOTE — Discharge Instructions (Addendum)
 While you were in the emergency room, you had blood work done that overall was normal for you.  Your CT scan of your abdomen that was normal.  You likely have a viral stomach infection.  You can take Zofran as needed for nausea.  Symptoms should begin to improve over the next 2 to 3 days.

## 2023-04-14 NOTE — ED Triage Notes (Signed)
 Pt c/o abd pain, with n/v x 4 days, diarrhea x 2 days

## 2023-04-14 NOTE — ED Notes (Signed)
 Discharge paperwork given and verbally understood.

## 2023-04-24 ENCOUNTER — Ambulatory Visit: Payer: PRIVATE HEALTH INSURANCE

## 2023-04-25 ENCOUNTER — Other Ambulatory Visit: Payer: Self-pay | Admitting: Family Medicine

## 2023-05-04 ENCOUNTER — Other Ambulatory Visit: Payer: Self-pay | Admitting: Family Medicine

## 2023-05-17 ENCOUNTER — Encounter (HOSPITAL_COMMUNITY): Payer: Self-pay

## 2023-05-19 ENCOUNTER — Encounter: Payer: Self-pay | Admitting: Internal Medicine

## 2023-05-19 ENCOUNTER — Ambulatory Visit (INDEPENDENT_AMBULATORY_CARE_PROVIDER_SITE_OTHER): Payer: Medicare (Managed Care) | Admitting: Internal Medicine

## 2023-05-19 ENCOUNTER — Ambulatory Visit: Payer: PRIVATE HEALTH INSURANCE | Admitting: Internal Medicine

## 2023-05-19 VITALS — BP 120/68 | HR 78 | Temp 97.6°F | Ht 68.0 in | Wt 215.6 lb

## 2023-05-19 DIAGNOSIS — D492 Neoplasm of unspecified behavior of bone, soft tissue, and skin: Secondary | ICD-10-CM

## 2023-05-19 NOTE — Progress Notes (Signed)
 Photographs Taken 05/20/2023 : ==============================  [facility]: [id number]   -- Medical Office Visit --  Patient: Edgar Brewer      Age: 63 y.o.       Sex:  male  Date:   05/19/2023 Today's Healthcare Provider: Anthon Kins, MD  ==============================   Chief Complaint: Acute Visit (Patient presents today for head check. He reports a noticeable lump/ mass right side of the head. He reports that it has changed in size/ shape and is itchy.)  History of Present Illness Edgar Brewer is a 63 year old male who presents with a sore on his head.  He has a sore on his head, described as possibly being a wart, present for a couple of weeks. It is itchy, leading to scratching that may have caused some marks.  He feels extremely tired, stating he is 'ready to fall over' after being on dialysis all day, indicating a significant impact on his energy levels and overall well-being.  Background Reviewed: Problem List: has Chronic venous insufficiency; Varicose veins of lower extremities with complications; Chronic diastolic CHF (congestive heart failure) (HCC); CKD (chronic kidney disease), stage IV (HCC); Diabetes mellitus, type II, insulin  dependent (HCC); Obesity; Essential hypertension; Anemia of chronic kidney failure; ESRD (end stage renal disease) on dialysis (HCC); Elevated LFTs; Gallbladder abscess; Other disorders of phosphorus metabolism; Secondary hyperparathyroidism of renal origin (HCC); Aortic stenosis; Type 2 diabetes mellitus with diabetic neuropathy, unspecified (HCC); History of stroke; GERD (gastroesophageal reflux disease); Wound of left leg; Left leg cellulitis; and Fatty liver on their problem list. Medications:  has a current medication list which includes the following prescription(s): aspirin  ec, b complex-vitamin c -folic acid , b-d ultrafine iii short pen, cinacalcet , fluticasone , gabapentin , basaglar  kwikpen, lantus   solostar, lanthanum, omeprazole , ondansetron , prednisolone acetate, rosuvastatin , and sevelamer  carbonate.  Allergies:  is allergic to codeine and clindamycin /lincomycin.  Past Medical History:  has a past medical history of Acute on chronic combined systolic and diastolic CHF, NYHA class 1 (HCC) (01/13/2015), Allergy, Arthritis, Chronic kidney disease, Diabetes mellitus type 2 with complications (HCC), GERD (gastroesophageal reflux disease), Head injury with loss of consciousness (HCC), History of kidney stones, Hypertension, Neuromuscular disorder (HCC), Neuropathy, Pulmonary hypertension (HCC), Stroke (HCC), and Varicose veins of lower extremities with complications (12/24/2014). Past Surgical History:   has a past surgical history that includes Endovenous ablation saphenous vein w/ laser (Left, 07-23-2015); Endovenous ablation saphenous vein w/ laser (Right, 10/15/2015); Colonoscopy w/ polypectomy (03/2016); AV fistula placement (Left, 06/01/2016); AV fistula placement (Left, 08/10/2017); Cholecystectomy (N/A, 10/04/2018); Esophagogastroduodenoscopy (03/2016); TEE without cardioversion (N/A, 11/19/2018); IR Radiologist Eval & Mgmt (11/29/2018); Gallbladder surgery (10/2018); Cystoscopy with retrograde pyelogram, ureteroscopy and stent placement (Bilateral, 02/25/2021); and Holmium laser application (Right, 02/25/2021). Social History:   reports that he quit smoking about 8 years ago. His smoking use included cigarettes. He started smoking about 53 years ago. He has a 45 pack-year smoking history. He has never used smokeless tobacco. He reports current alcohol use of about 1.0 standard drink of alcohol per week. He reports that he does not use drugs. Family History:  family history includes Arthritis in his mother; Hearing loss in his father; Heart disease in his maternal grandmother; Hyperlipidemia in his maternal grandmother; Lung cancer in his maternal grandfather; Prostate cancer in his father. Depression Screen  and Health Maintenance:    02/07/2023    3:42 PM 10/06/2022    3:34 PM 09/22/2022   11:41 AM 04/19/2022    2:56 PM  PHQ 2/9 Scores  PHQ - 2 Score 2 2 1  0  PHQ- 9 Score 5 3 3     Current Outpatient Medications on File Prior to Visit  Medication Sig   aspirin  EC 81 MG tablet Take 81 mg by mouth daily. Swallow whole.   b complex-vitamin c -folic acid  (NEPHRO-VITE) 0.8 MG TABS tablet Take 1 tablet by mouth in the morning.   B-D ULTRAFINE III SHORT PEN 31G X 8 MM MISC USE TO INJECT INSULIN  INTO THE SKIN DAILY.   cinacalcet  (SENSIPAR ) 60 MG tablet Take 60 mg by mouth every Tuesday, Thursday, and Saturday at 6 PM. In the evening.   fluticasone  (FLONASE ) 50 MCG/ACT nasal spray Place 2 sprays into both nostrils daily.   gabapentin  (NEURONTIN ) 100 MG capsule Take 1 capsule (100 mg total) by mouth 3 (three) times daily. Substitute for 300mg  once a day- patient prefers to space out dose   Insulin  Glargine (BASAGLAR  KWIKPEN) 100 UNIT/ML Inject 50 Units into the skin daily.   insulin  glargine (LANTUS  SOLOSTAR) 100 UNIT/ML Solostar Pen Inject 50 Units into the skin daily.   lanthanum (FOSRENOL) 1000 MG chewable tablet Chew by mouth.   omeprazole  (PRILOSEC) 20 MG capsule Take 1 capsule by mouth once daily   ondansetron  (ZOFRAN ) 4 MG tablet Take 1 tablet (4 mg total) by mouth every 6 (six) hours.   prednisoLONE acetate (PRED FORTE) 1 % ophthalmic suspension Place 1 drop into the right eye 4 (four) times daily.   rosuvastatin  (CRESTOR ) 10 MG tablet Take 1 tablet by mouth once daily   sevelamer  carbonate (RENVELA ) 800 MG tablet Take 2 tablets (1,600 mg total) by mouth 3 (three) times daily with meals.   No current facility-administered medications on file prior to visit.  There are no discontinued medications.   Physical Exam:    05/19/2023    3:48 PM 04/14/2023    5:35 PM 04/14/2023    5:00 PM  Vitals with BMI  Height 5\' 8"     Weight 215 lbs 10 oz    BMI 32.79    Systolic 120 146 962  Diastolic 68 69  74  Pulse 78 100 97  Vital signs reviewed.  Nursing notes reviewed. Weight trend reviewed. Physical Exam General Appearance:  No acute distress appreciable.   Well-groomed, healthy-appearing male.  Well proportioned with no abnormal fat distribution.  Good muscle tone. Pulmonary:  Normal work of breathing at rest, no respiratory distress apparent. SpO2: 97 %  Musculoskeletal: All extremities are intact.  Neurological:  Awake, alert, oriented, and engaged.  No obvious focal neurological deficits or cognitive impairments.  Sensorium seems unclouded.   Speech is clear and coherent with logical content. Psychiatric:  Appropriate mood, pleasant and cooperative demeanor, thoughtful and engaged during the exam Physical Exam  Photographs Taken 05/20/2023 :    Assessment & Plan Skin tumor Placed urgent dermatology referral. Reviewed differential and importance of prompt dermatology evaluation.      Orders Placed During this Encounter:   Orders Placed This Encounter  Procedures   Ambulatory referral to Dermatology    Referral Priority:   Urgent    Referral Type:   Consultation    Referral Reason:   Specialty Services Required    Requested Specialty:   Dermatology    Number of Visits Requested:   1   Ambulatory referral to Dermatology       Comments: Reason for consult: 6 cm possibly rapidly growing pruritic, hyperkeratotic plaque on right parietal scalp-appeared within ~2 weeks after  head shave. Lesion has warty/verrucous surface with focal crust but no central ulcer seen. Given size > 2 cm and explosive evolution, high suspicion for high-risk cSCC vs keratoacanthoma; inflamed SK also considered. Request: Please perform dermoscopic evaluation and urgent biopsy (punch or deep shave acceptable) to establish diagnosis; appreciate recommendations for definitive management. Full skin survey also requested. High-resolution photo and clinical note attached. Pertinent history: No prior skin cancers;  occupation involves outdoor work; no immunosuppression. No systemic symptoms. Patient counselled on sun protection and to avoid further trauma; understands need for prompt evaluation.        This document was synthesized by artificial intelligence ([employer]bridge) using HIPAA-compliant recording of the clinical interaction;   We discussed the use of AI scribe software for clinical note transcription with the patient, who gave verbal consent to proceed. additional Info: This encounter employed state-of-the-art, real-time, collaborative documentation. The patient actively reviewed and assisted in updating their electronic medical record on a shared screen, ensuring transparency and facilitating joint problem-solving for the problem list, overview, and plan. This approach promotes accurate, informed care. The treatment plan was discussed and reviewed in detail, including medication safety, potential side effects, and all patient questions. We confirmed understanding and comfort with the plan. Follow-up instructions were established, including contacting the office for any concerns, returning if symptoms worsen, persist, or new symptoms develop, and precautions for potential emergency department visits.

## 2023-05-19 NOTE — Patient Instructions (Addendum)
-  SKIN LESION ON HEAD: A skin lesion is an area of abnormal tissue on the skin. The sore on your head is suspected to be a seborrheic dermatitis, irritated but possibly could be a skin cancer. INSTRUCTIONS: Please take a picture of the sore on your head as it evolves and upload it to the system for further evaluation  Differential Diagnosis: Seborrheic Keratosis (~70% likelihood) Reason: Waxy, "stuck-on," verrucous (wart-like) appearance. Common in people over 50. Scalp is a frequent site. No pain or rapid change. Verruca Vulgaris (Common Wart) (~15% likelihood) Reason: Papillomatous surface could fit. But warts are less common on the scalp of older adults compared to seborrheic keratoses. Squamous Cell Carcinoma (SCC), verrucous type (~10% likelihood) Reason: SCC can sometimes look "warty" or thickened. Risk is higher on sun-exposed areas like the scalp, especially in older adults. SCC often hurts or bleeds eventually, which this doesn't seem to be doing -- still something to keep in mind. Actinic Keratosis (Hypertrophic type) (~5% likelihood) Reason: These can be rough and raised. But usually flatter than this and more gritty than papillomatous.  Important Notes: Seborrheic keratoses are benign, but if it suddenly changes (rapid growth, bleeding, ulcerating, very dark/black color), it's worth checking for irritated seborrheic keratosis or malignant transformation (rare). Biopsy is the definitive way to distinguish between these if there's any doubt, especially to rule out SCC.

## 2023-05-20 ENCOUNTER — Encounter: Payer: Self-pay | Admitting: Internal Medicine

## 2023-06-08 ENCOUNTER — Ambulatory Visit: Payer: PRIVATE HEALTH INSURANCE | Admitting: Family Medicine

## 2023-06-08 ENCOUNTER — Encounter (HOSPITAL_COMMUNITY): Payer: Self-pay

## 2023-06-15 ENCOUNTER — Ambulatory Visit (INDEPENDENT_AMBULATORY_CARE_PROVIDER_SITE_OTHER): Payer: Medicare (Managed Care) | Admitting: Family Medicine

## 2023-06-15 ENCOUNTER — Encounter: Payer: Self-pay | Admitting: Family Medicine

## 2023-06-15 ENCOUNTER — Encounter (HOSPITAL_COMMUNITY): Payer: Self-pay

## 2023-06-15 VITALS — BP 110/60 | HR 76 | Temp 98.6°F | Ht 68.0 in | Wt 216.9 lb

## 2023-06-15 DIAGNOSIS — I5032 Chronic diastolic (congestive) heart failure: Secondary | ICD-10-CM | POA: Diagnosis not present

## 2023-06-15 DIAGNOSIS — Z794 Long term (current) use of insulin: Secondary | ICD-10-CM

## 2023-06-15 DIAGNOSIS — N186 End stage renal disease: Secondary | ICD-10-CM | POA: Diagnosis not present

## 2023-06-15 DIAGNOSIS — I350 Nonrheumatic aortic (valve) stenosis: Secondary | ICD-10-CM | POA: Diagnosis not present

## 2023-06-15 DIAGNOSIS — Z8673 Personal history of transient ischemic attack (TIA), and cerebral infarction without residual deficits: Secondary | ICD-10-CM

## 2023-06-15 DIAGNOSIS — I10 Essential (primary) hypertension: Secondary | ICD-10-CM

## 2023-06-15 DIAGNOSIS — E119 Type 2 diabetes mellitus without complications: Secondary | ICD-10-CM | POA: Diagnosis not present

## 2023-06-15 DIAGNOSIS — Z992 Dependence on renal dialysis: Secondary | ICD-10-CM

## 2023-06-15 LAB — POCT GLYCOSYLATED HEMOGLOBIN (HGB A1C): Hemoglobin A1C: 7 % — AB (ref 4.0–5.6)

## 2023-06-15 MED ORDER — DICLOFENAC SODIUM 1 % EX GEL: 2.0000 g | Freq: Four times a day (QID) | CUTANEOUS | 1 refills | Status: DC

## 2023-06-15 NOTE — Progress Notes (Signed)
 Phone [phone] In person visit   Subjective:   Edgar Brewer is a 63 y.o. year old very pleasant male patient who presents for/with See problem oriented charting Chief Complaint  Patient presents with   Medical Management of Chronic Issues   Diabetes    DEE is not due until later in the year.   Hyperlipidemia   Hypertension   Knee Pain    Pt c/o bilateral knee pain when he walks   Past Medical History-  Patient Active Problem List   Diagnosis Date Noted   History of stroke 02/25/2020    Priority: High   ESRD (end stage renal disease) on dialysis (HCC) 10/03/2018    Priority: High   Diabetes mellitus, type II, insulin  dependent (HCC) 01/14/2015    Priority: High   Chronic diastolic CHF (congestive heart failure) (HCC) 01/13/2015    Priority: High   Fatty liver 02/10/2022    Priority: Medium    GERD (gastroesophageal reflux disease) 03/25/2020    Priority: Medium    Aortic stenosis 02/25/2020    Priority: Medium    Type 2 diabetes mellitus with diabetic neuropathy, unspecified (HCC) 02/25/2020    Priority: Medium    Gallbladder abscess 11/15/2018    Priority: Medium    Elevated LFTs 10/18/2018    Priority: Medium    Other disorders of phosphorus metabolism 01/19/2018    Priority: Medium    Secondary hyperparathyroidism of renal origin (HCC) 09/27/2017    Priority: Medium    Essential hypertension 01/14/2015    Priority: Medium    Anemia of chronic kidney failure 01/14/2015    Priority: Medium    Varicose veins of lower extremities with complications 12/24/2014    Priority: Medium    Wound of left leg 05/08/2020    Priority: Low   Left leg cellulitis 05/08/2020    Priority: Low   CKD (chronic kidney disease), stage IV (HCC) 01/14/2015    Priority: Low   Obesity 01/14/2015    Priority: Low   Chronic venous insufficiency 10/22/2014    Priority: Low    Medications- reviewed and updated Current Outpatient Medications  Medication Sig Dispense Refill    aspirin  EC 81 MG tablet Take 81 mg by mouth daily. Swallow whole.     b complex-vitamin c -folic acid  (NEPHRO-VITE) 0.8 MG TABS tablet Take 1 tablet by mouth in the morning.     B-D ULTRAFINE III SHORT PEN 31G X 8 MM MISC USE TO INJECT INSULIN  INTO THE SKIN DAILY. 100 each 0   cinacalcet  (SENSIPAR ) 60 MG tablet Take 60 mg by mouth every Tuesday, Thursday, and Saturday at 6 PM. In the evening.     fluticasone  (FLONASE ) 50 MCG/ACT nasal spray Place 2 sprays into both nostrils daily. 16 g 3   gabapentin  (NEURONTIN ) 100 MG capsule Take 1 capsule (100 mg total) by mouth 3 (three) times daily. Substitute for 300mg  once a day- patient prefers to space out dose 270 capsule 3   insulin  glargine (LANTUS  SOLOSTAR) 100 UNIT/ML Solostar Pen Inject 50 Units into the skin daily. 15 mL 5   lanthanum (FOSRENOL) 1000 MG chewable tablet Chew by mouth.     omeprazole  (PRILOSEC) 20 MG capsule Take 1 capsule by mouth once daily 90 capsule 0   ondansetron  (ZOFRAN ) 4 MG tablet Take 1 tablet (4 mg total) by mouth every 6 (six) hours. (Patient not taking: Reported on 06/15/2023) 12 tablet 0   prednisoLONE acetate (PRED FORTE) 1 % ophthalmic suspension Place 1 drop into the  right eye 4 (four) times daily.     rosuvastatin  (CRESTOR ) 10 MG tablet Take 1 tablet by mouth once daily 90 tablet 0   sevelamer  carbonate (RENVELA ) 800 MG tablet Take 2 tablets (1,600 mg total) by mouth 3 (three) times daily with meals.     No current facility-administered medications for this visit.     Objective:  BP 110/60   Pulse 76   Temp 98.6 F (37 C)   Ht 5\' 8"  (1.727 m)   Wt 216 lb 14.4 oz (98.4 kg)   SpO2 98%   BMI 32.98 kg/m  Gen: NAD, resting comfortably CV: RRR  faint murmur Lungs: CTAB no crackles, wheeze, rhonchi Ext:  trace edema Skin: warm, dry     Assessment and Plan   #Health maintenance- declines colonoscopy due to cost, PSA not being pursued per urology due to limited life expectancy  # Knee pain bilaterally  S:  knee pain when walking any distance -some thumb pain at times and tingling at times. Random pains at times -occasoinal twitch in hand and hard to hold onto something like an uncovered cup A/P: could try Voltaren gel on knees or hands if desired- also element of neuropathy with   Wt Readings from Last 3 Encounters:  06/15/23 216 lb 14.4 oz (98.4 kg)  05/19/23 215 lb 9.6 oz (97.8 kg)  04/14/23 218 lb (98.9 kg)   #skin lesion on head- dermatology visit schedued may 27th- set up by Dr. Boston Byers  #End-stage renal disease on hemodialysis since January 2019. thorugh related to diabetes #Secondary hyperparathyroidism S: Patient on dialysis followed by Dr. Elex Grimmer on MWF at [facility] site  -AV fistula 06/01/2016 by Dr. Shirley Douglas A/P: reports reasonably stable - continue current medications   -On Renvela  phosphate binder  #Heart failure with  ejection fraction-follows with Dr. Stann Earnest S: Medication: lasix  in past, now managed by dialysis   Edema: no increase Wt Readings from Last 3 Encounters:  06/15/23 216 lb 14.4 oz (98.4 kg)  05/19/23 215 lb 9.6 oz (97.8 kg)  04/14/23 218 lb (98.9 kg)  Weight gain:largely stable- within 2 lbs of lst visit Shortness of breath: none reported A/P: stable with dialysis- continue to monitor    #hypertension S: medication: none A/P: well controlled continue current medications   #Aortic stenosis S:Last echocardiogram august 2022-reported as mild with 1 year repeat per Dr. Stann Earnest A/P: just saw cardiology in February and they said so mild ok to wait until next year for echocardiogram.     # Diabetes with neuropathy S: Medication: basaglar  50 units, gabapentin  300mg  daily limit per dialysis-going to try 100 mg 3 times a day- no sedation thankfully CBGs- 108-up to 240 if a bad meal  Lab Results  Component Value Date   HGBA1C 7.0 (A) 06/15/2023   HGBA1C 7.1 (A) 02/07/2023   HGBA1C 7.3 (A) 10/06/2022   A/P: a1c down to 7 today- congratulated him and asked him to  continue current medications   #history of stroke- now on aspirin  81mg - stutter lingers from this #hyperlipidemia S: Medication: in 2023 back on rosuvastatin  10 mg  Lab Results  Component Value Date   CHOL 147 07/06/2021   HDL 36.10 (L) 07/06/2021   LDLDIRECT 41.0 07/06/2021   TRIG (H) 07/06/2021    529.0 Triglyceride is over 400; calculations on Lipids are invalid.   CHOLHDL 4 07/06/2021   A/P: we tried to get records from [facility] but no labs from this year show cholesterol- asked him to see  if they can draw next bloodwork- messaged Dr. Edson Graces -remains on aspirin    #Elevated LFTs- trying to get updated levels through dialysis- likely fatty liver. Minimal alcohol intake  Recommended follow up: Return in about 4 months (around 10/16/2023) for followup or sooner if needed.Schedule b4 you leave. Future Appointments  Date Time Provider Department Center  06/20/2023 10:15 AM Paci, Lenore Rafter, MD [facility] None    Lab/Order associations:   ICD-10-CM   1. Diabetes mellitus, type II, insulin  dependent (HCC)  E11.9 POCT glycosylated hemoglobin (Hb A1C)   Z79.4     2. Mild aortic stenosis  I35.0       No orders of the defined types were placed in this encounter.   Return precautions advised.  Clarisa Crooked, MD

## 2023-06-15 NOTE — Patient Instructions (Addendum)
 Congratulations on the a1c looking so good!   Might help the knee:  [email or web address]  See if they can draw cholesterol for you with next labs and liver tests- I also messaged Dr. Edson Graces  Recommended follow up: Return in about 4 months (around 10/16/2023) for followup or sooner if needed.Schedule b4 you leave.

## 2023-06-20 ENCOUNTER — Encounter: Payer: Self-pay | Admitting: Dermatology

## 2023-06-20 ENCOUNTER — Ambulatory Visit: Payer: Medicare (Managed Care) | Admitting: Dermatology

## 2023-06-20 DIAGNOSIS — B079 Viral wart, unspecified: Secondary | ICD-10-CM

## 2023-06-20 DIAGNOSIS — D492 Neoplasm of unspecified behavior of bone, soft tissue, and skin: Secondary | ICD-10-CM

## 2023-06-20 DIAGNOSIS — D489 Neoplasm of uncertain behavior, unspecified: Secondary | ICD-10-CM

## 2023-06-20 NOTE — Patient Instructions (Signed)

## 2023-06-20 NOTE — Progress Notes (Signed)
   Follow-Up Visit   Subjective  Edgar Brewer is a 63 y.o. male who presents for the following: evaluation of plaque on the right parietal scalp. Present for at least 1 month. Patient reports that it started small and has grown. Denies pain or previous treatment. Patient accompanied by his mother.  The following portions of the chart were reviewed this encounter and updated as appropriate: medications, allergies, medical history  Review of Systems:  No other skin or systemic complaints except as noted in HPI or Assessment and Plan.  Objective  Well appearing patient in no apparent distress; mood and affect are within normal limits.  A focused examination was performed of the following areas: scalp  Relevant exam findings are noted in the Assessment and Plan.  Right Parietal Scalp 3.0cm x 2.0cm Wart-like stuck on plaque    Assessment & Plan   NEOPLASM OF UNCERTAIN BEHAVIOR Right Parietal Scalp Epidermal / dermal shaving  Lesion diameter (cm):  3 Timeout: patient name, date of birth, surgical site, and procedure verified   Anesthesia: the lesion was anesthetized in a standard fashion   Anesthetic:  1% lidocaine  w/ epinephrine  1-100,000 local infiltration Instrument used: DermaBlade   Hemostasis achieved with: pressure and aluminum chloride   Outcome: patient tolerated procedure well   Post-procedure details: sterile dressing applied and wound care instructions given   Dressing type: pressure dressing and petrolatum    Specimen 1 - Surgical pathology Differential Diagnosis: r/o NMSC vs other  Check Margins: YES  Return if symptoms worsen or fail to improve.  Maurine Sovereign, RN, am acting as scribe for Deneise Finlay, MD .   Documentation: I have reviewed the above documentation for accuracy and completeness, and I agree with the above.  Deneise Finlay, MD

## 2023-06-23 LAB — SURGICAL PATHOLOGY

## 2023-06-25 ENCOUNTER — Ambulatory Visit: Payer: Self-pay | Admitting: Dermatology

## 2023-07-06 ENCOUNTER — Encounter (HOSPITAL_COMMUNITY): Payer: Self-pay

## 2023-07-11 ENCOUNTER — Other Ambulatory Visit: Payer: Self-pay | Admitting: Family Medicine

## 2023-07-31 ENCOUNTER — Other Ambulatory Visit: Payer: Self-pay | Admitting: Family Medicine

## 2023-08-05 ENCOUNTER — Other Ambulatory Visit: Payer: Self-pay | Admitting: Family Medicine

## 2023-08-22 ENCOUNTER — Telehealth: Payer: Self-pay | Admitting: Family Medicine

## 2023-08-22 NOTE — Telephone Encounter (Unsigned)
 Copied from CRM [id number]. Topic: General - Other >> Aug 22, 2023 11:40 AM Jasmin G wrote: Reason for CRM: Needed paperwork from [employer] for pt needs to be filled out and faxed back before July 31st, please do at your earliest convenience.

## 2023-08-23 NOTE — Telephone Encounter (Signed)
 I have not received any paperwork from this company.

## 2023-08-29 ENCOUNTER — Ambulatory Visit: Payer: Self-pay

## 2023-08-29 ENCOUNTER — Other Ambulatory Visit: Payer: Self-pay

## 2023-08-29 ENCOUNTER — Encounter (HOSPITAL_BASED_OUTPATIENT_CLINIC_OR_DEPARTMENT_OTHER): Payer: Self-pay | Admitting: Emergency Medicine

## 2023-08-29 ENCOUNTER — Encounter: Payer: Self-pay | Admitting: Physician Assistant

## 2023-08-29 ENCOUNTER — Other Ambulatory Visit (HOSPITAL_BASED_OUTPATIENT_CLINIC_OR_DEPARTMENT_OTHER): Payer: Self-pay

## 2023-08-29 ENCOUNTER — Emergency Department (HOSPITAL_BASED_OUTPATIENT_CLINIC_OR_DEPARTMENT_OTHER)
Admission: EM | Admit: 2023-08-29 | Discharge: 2023-08-29 | Disposition: A | Discharge status: Home / Self Care | Payer: Medicare (Managed Care) | Attending: Emergency Medicine | Admitting: Emergency Medicine

## 2023-08-29 ENCOUNTER — Emergency Department (HOSPITAL_BASED_OUTPATIENT_CLINIC_OR_DEPARTMENT_OTHER): Payer: Medicare (Managed Care) | Admitting: Radiology

## 2023-08-29 ENCOUNTER — Ambulatory Visit (INDEPENDENT_AMBULATORY_CARE_PROVIDER_SITE_OTHER): Payer: Medicare (Managed Care) | Admitting: Physician Assistant

## 2023-08-29 ENCOUNTER — Encounter (HOSPITAL_COMMUNITY): Payer: Self-pay

## 2023-08-29 VITALS — BP 124/60 | HR 82 | Temp 98.2°F | Ht 68.0 in | Wt 221.2 lb

## 2023-08-29 DIAGNOSIS — N186 End stage renal disease: Secondary | ICD-10-CM | POA: Insufficient documentation

## 2023-08-29 DIAGNOSIS — E1140 Type 2 diabetes mellitus with diabetic neuropathy, unspecified: Secondary | ICD-10-CM | POA: Diagnosis not present

## 2023-08-29 DIAGNOSIS — Z7982 Long term (current) use of aspirin: Secondary | ICD-10-CM | POA: Insufficient documentation

## 2023-08-29 DIAGNOSIS — L97521 Non-pressure chronic ulcer of other part of left foot limited to breakdown of skin: Secondary | ICD-10-CM

## 2023-08-29 DIAGNOSIS — E1122 Type 2 diabetes mellitus with diabetic chronic kidney disease: Secondary | ICD-10-CM | POA: Insufficient documentation

## 2023-08-29 DIAGNOSIS — E11621 Type 2 diabetes mellitus with foot ulcer: Secondary | ICD-10-CM | POA: Insufficient documentation

## 2023-08-29 DIAGNOSIS — Z794 Long term (current) use of insulin: Secondary | ICD-10-CM | POA: Diagnosis not present

## 2023-08-29 DIAGNOSIS — L03116 Cellulitis of left lower limb: Secondary | ICD-10-CM | POA: Insufficient documentation

## 2023-08-29 DIAGNOSIS — M79675 Pain in left toe(s): Secondary | ICD-10-CM | POA: Diagnosis present

## 2023-08-29 LAB — CBC WITH DIFFERENTIAL/PLATELET
Abs Immature Granulocytes: 0.03 K/uL (ref 0.00–0.07)
Basophils Absolute: 0 K/uL (ref 0.0–0.1)
Basophils Relative: 1 %
Eosinophils Absolute: 0.2 K/uL (ref 0.0–0.5)
Eosinophils Relative: 3 %
HCT: 36.5 % — ABNORMAL LOW (ref 39.0–52.0)
Hemoglobin: 12.2 g/dL — ABNORMAL LOW (ref 13.0–17.0)
Immature Granulocytes: 0 %
Lymphocytes Relative: 12 %
Lymphs Abs: 0.8 K/uL (ref 0.7–4.0)
MCH: 32.1 pg (ref 26.0–34.0)
MCHC: 33.4 g/dL (ref 30.0–36.0)
MCV: 96.1 fL (ref 80.0–100.0)
Monocytes Absolute: 0.5 K/uL (ref 0.1–1.0)
Monocytes Relative: 7 %
Neutro Abs: 5.6 K/uL (ref 1.7–7.7)
Neutrophils Relative %: 77 %
Platelets: 188 K/uL (ref 150–400)
RBC: 3.8 MIL/uL — ABNORMAL LOW (ref 4.22–5.81)
RDW: 13.4 % (ref 11.5–15.5)
WBC: 7.2 K/uL (ref 4.0–10.5)
nRBC: 0 % (ref 0.0–0.2)

## 2023-08-29 LAB — COMPREHENSIVE METABOLIC PANEL WITH GFR
ALT: 31 U/L (ref 0–44)
AST: 27 U/L (ref 15–41)
Albumin: 4.5 g/dL (ref 3.5–5.0)
Alkaline Phosphatase: 366 U/L — ABNORMAL HIGH (ref 38–126)
Anion gap: 14 (ref 5–15)
BUN: 34 mg/dL — ABNORMAL HIGH (ref 8–23)
CO2: 29 mmol/L (ref 22–32)
Calcium: 11 mg/dL — ABNORMAL HIGH (ref 8.9–10.3)
Chloride: 93 mmol/L — ABNORMAL LOW (ref 98–111)
Creatinine, Ser: 9.78 mg/dL — ABNORMAL HIGH (ref 0.61–1.24)
GFR, Estimated: 5 mL/min — ABNORMAL LOW (ref >60)
Glucose, Bld: 180 mg/dL — ABNORMAL HIGH (ref 70–99)
Potassium: 5.6 mmol/L — ABNORMAL HIGH (ref 3.5–5.1)
Sodium: 137 mmol/L (ref 135–145)
Total Bilirubin: 0.4 mg/dL (ref 0.0–1.2)
Total Protein: 7.5 g/dL (ref 6.5–8.1)

## 2023-08-29 MED ORDER — DOXYCYCLINE HYCLATE 100 MG PO TABS
100.0000 mg | ORAL_TABLET | Freq: Once | ORAL | Status: AC
  Administered 2023-08-29: 100 mg via ORAL
  Filled 2023-08-29: qty 1

## 2023-08-29 MED ORDER — DOXYCYCLINE HYCLATE 100 MG PO CAPS
100.0000 mg | ORAL_CAPSULE | Freq: Two times a day (BID) | ORAL | 0 refills | Status: DC
  Filled 2023-08-29: qty 14, 7d supply, fill #0

## 2023-08-29 MED ORDER — AMOXICILLIN 500 MG PO CAPS
500.0000 mg | ORAL_CAPSULE | Freq: Every day | ORAL | 0 refills | Status: DC
Start: 2023-08-29 — End: 2023-09-07
  Filled 2023-08-29: qty 7, 7d supply, fill #0

## 2023-08-29 MED ORDER — AMOXICILLIN 500 MG PO CAPS
500.0000 mg | ORAL_CAPSULE | Freq: Once | ORAL | Status: AC
Start: 2023-08-29 — End: 2023-08-29
  Administered 2023-08-29: 500 mg via ORAL
  Filled 2023-08-29: qty 1

## 2023-08-29 NOTE — Discharge Instructions (Addendum)
 Call your doctor and get an appointment for recheck in 48-72 hours.   Please also call the podiatrist listed and get an appointment for specialty follow-up.   Tell your dialysis center about your new medications tomorrow.      Please read and follow all provided instructions.  Your diagnoses today include:  1. Skin ulcer of toe of left foot, limited to breakdown of skin (HCC)   2. Cellulitis of foot, left     Tests performed today include: Vital signs. See below for your results today.  Complete blood cell count: Normal infection fighting cell count, slightly low red blood cell count Basic metabolic panel: Signs consistent with end-stage renal disease  Medications prescribed:  Doxycycline  - antibiotic  You have been prescribed an antibiotic medicine: take the entire course of medicine even if you are feeling better. Stopping early can cause the antibiotic not to work.  Amoxicillin  - antibiotic  You have been prescribed an antibiotic medicine: take the entire course of medicine even if you are feeling better. Stopping early can cause the antibiotic not to work.  Take any prescribed medications only as directed.   Home care instructions:  Follow any educational materials contained in this packet. Keep affected area above the level of your heart when possible. Wash area gently twice a day with warm soapy water . Do not apply alcohol or hydrogen peroxide. Cover the area if it draining or weeping.   Follow-up instructions: You need to follow-up closely with your doctor for monitoring of your wound.  It would be ideal for you to have podiatry or orthopedic follow-up.  Referral given.  Return instructions:  Return to the Emergency Department if you have: Fever Worsening symptoms Worsening pain Worsening swelling Redness of the skin that moves away from the affected area, especially if it streaks away from the affected area  Any other emergent concerns  Your vital signs today  were: BP (!) 146/65 (BP Location: Right Arm)   Pulse 85   Temp 98.2 F (36.8 C) (Oral)   Resp 18   SpO2 98%  If your blood pressure (BP) was elevated above 135/85 this visit, please have this repeated by your doctor within one month. --------------

## 2023-08-29 NOTE — Telephone Encounter (Signed)
 Noted

## 2023-08-29 NOTE — Patient Instructions (Signed)
 It was great to see you!  YOUR PLAN: RIGHT FOOT ULCER WITH POSSIBLE OSTEOMYELITIS: You have a wound on your right foot that may be infected and could affect the bone due to poor blood flow and your diabetes. -Go to the emergency room immediately for urgent evaluation and management. -Imaging studies like an X-ray or CT scan will be done to check for bone infection. -You will receive IV antibiotics to treat any potential infection.  Take care,  Lucie Buttner PA-C

## 2023-08-29 NOTE — ED Triage Notes (Signed)
 Left 2nd two numbness and discoloration 1 week. Denies injury to toe. Type 2 diabetic. Denies fever.   Dialysis on MWF. Hasn't missed any visits.

## 2023-08-29 NOTE — Progress Notes (Signed)
 Edgar Brewer is a 63 y.o. male here for a new problem.  History of Present Illness:   Chief Complaint  Patient presents with   c/o Toe problem    Pt c/o left 2nd toe is discolored spreading down into foot area, open area no drainage.    HPI  Discussed the use of AI scribe software for clinical note transcription with the patient, who gave verbal consent to proceed.  History of Present Illness Edgar Brewer is a 63 year old male with diabetes who presents with a concerning foot wound. He is accompanied by his mother.  A wound on his foot was noticed a couple of days ago during a routine check at dialysis. His mother, who cut his nails three to four weeks ago, did not notice any issues at that time. He estimates the wound developed within the last week and a half. There is no recollection of specific trauma to the foot. He has diabetes with blood sugar levels that are improving but still not optimal, with a reading of 144 mg/dL this morning. He is insulin -dependent.  He does not use a continuous glucose monitor. There are no fever or chills and no significant changes in his general well-being. He experiences peripheral neuropathy, as he does not feel his feet on a normal day and cannot see them clearly.   His mother notes he does not elevate his legs when sitting. He does not work and lacks disability benefits. Additionally, there is a scrape on the back of his leg caused by his other foot, which he describes as itching.    Past Medical History:  Diagnosis Date   Acute on chronic combined systolic and diastolic CHF, NYHA class 1 (HCC) 01/13/2015   Allergy    Arthritis    Chronic kidney disease    M[facility].   Diabetes mellitus type 2 with complications (HCC)    Previous insulin , non-insulin  requiring noted 09/2018.  Complications include retinopathy, peripheral neuropathy.   GERD (gastroesophageal reflux disease)    at times   Head injury with loss of consciousness (HCC)     History of kidney stones    Hypertension    Neuromuscular disorder (HCC)    Neuropathy    Pulmonary hypertension (HCC)    PA peak pressure 41 mmHg 03/04/16 echo   Stroke [facility])    no residual weakness   Varicose veins of lower extremities with complications 12/24/2014     Social History   Tobacco Use   Smoking status: Former    Current packs/day: 0.00    Average packs/day: 1 pack/day for 45.0 years (45.0 ttl pk-yrs)    Types: Cigarettes    Start date: 01/12/1970    Quit date: 01/13/2015    Years since quitting: 8.6   Smokeless tobacco: Never  Vaping Use   Vaping status: Never Used  Substance Use Topics   Alcohol use: Yes    Alcohol/week: 1.0 standard drink of alcohol    Types: 1 Glasses of wine per week    Comment: rare occassion   Drug use: No    Past Surgical History:  Procedure Laterality Date   AV FISTULA PLACEMENT Left 06/01/2016   Procedure: ARTERIOVENOUS (AV) FISTULA CREATION;  Surgeon: Oris Krystal FALCON, MD;  Location: [facility] OR;  Service: Vascular;  Laterality: Left;   AV FISTULA PLACEMENT Left 08/10/2017   Procedure: BRACHIOCEPHALIC ARTERIOVENOUS (AV) FISTULA CREATION LEFT UPPER EXTREMITY;  Surgeon: Eliza Lonni RAMAN, MD;  Location: [facility] OR;  Service: Vascular;  Laterality: Left;   CHOLECYSTECTOMY N/A 10/04/2018   Procedure: LAPAROSCOPIC CHOLECYSTECTOMY;  Surgeon: Kinsinger, Herlene Righter, MD;  Location: [facility] OR;  Service: General;  Laterality: N/A;   COLONOSCOPY W/ POLYPECTOMY  03/2016   For evaluation of iron deficiency anemia.  Dr. Shila.  10 polyps, largest 20 mm.  Pathology: tubular adenomas with no high-grade dysplasia, hyperplastic.  None bleeding internal hemorrhoids.  Pandiverticulosis.   CYSTOSCOPY WITH RETROGRADE PYELOGRAM, URETEROSCOPY AND STENT PLACEMENT Bilateral 02/25/2021   Procedure: CYSTOSCOPY WITH BILATERAL  RETROGRADE PYELOGRAM, RIGHT DIAGNOSTIC URETEROSCOPY,  BLADDER BIOPSY WITH FULGERATION;  Surgeon: Alvaro Hummer, MD;  Location: [facility] ORS;  Service: Urology;   Laterality: Bilateral;   ENDOVENOUS ABLATION SAPHENOUS VEIN W/ LASER Left 07-23-2015   endovenous laser ablation left greater saphenous vein by Krystal Doing MD   ENDOVENOUS ABLATION SAPHENOUS VEIN W/ LASER Right 10/15/2015   EVLA right greater saphenous vein by Krystal Doing MD   ESOPHAGOGASTRODUODENOSCOPY  03/2016   For evaluation of IDA.  Dr. Shila.  Duodenal erythema, pathology benign mucosa, no villous atrophy.   GALLBLADDER SURGERY  10/2018   HOLMIUM LASER APPLICATION Right 02/25/2021   Procedure: HOLMIUM LASER OF TUMOR;  Surgeon: Alvaro Hummer, MD;  Location: [facility] ORS;  Service: Urology;  Laterality: Right;   IR RADIOLOGIST EVAL & MGMT  11/29/2018   TEE WITHOUT CARDIOVERSION N/A 11/19/2018   Procedure: TRANSESOPHAGEAL ECHOCARDIOGRAM (TEE);  Surgeon: Alveta Aleene PARAS, MD;  Location: [facility] ENDOSCOPY;  Service: Cardiovascular;  Laterality: N/A;    Family History  Problem Relation Age of Onset   Arthritis Mother    Hearing loss Father    Prostate cancer Father    Hyperlipidemia Maternal Grandmother    Heart disease Maternal Grandmother    Lung cancer Maternal Grandfather     Allergies  Allergen Reactions   Codeine Anaphylaxis, Hives, Swelling and Other (See Comments)    Swelling all over body and the throat   Clindamycin /Lincomycin Nausea And Vomiting    Current Medications:   Current Outpatient Medications:    aspirin  EC 81 MG tablet, Take 81 mg by mouth daily. Swallow whole., Disp: , Rfl:    b complex-vitamin c -folic acid  (NEPHRO-VITE) 0.8 MG TABS tablet, Take 1 tablet by mouth in the morning., Disp: , Rfl:    BD PEN NEEDLE SHORT ULTRAFINE 31G X 8 MM MISC, USE TO INJECT INSULIN  INTO THE SKIN DAILY., Disp: 100 each, Rfl: 0   cinacalcet  (SENSIPAR ) 60 MG tablet, Take 60 mg by mouth every Tuesday, Thursday, and Saturday at 6 PM. In the evening., Disp: , Rfl:    diclofenac  Sodium (VOLTAREN  ARTHRITIS PAIN) 1 % GEL, Apply 2 g topically 4 (four) times daily. For knee or hand arthritis  pain, Disp: 350 g, Rfl: 1   gabapentin  (NEURONTIN ) 100 MG capsule, TAKE 1 CAPSULE BY MOUTH THREE TIMES DAILY, Disp: 270 capsule, Rfl: 0   insulin  glargine-yfgn (SEMGLEE ) 100 UNIT/ML Pen, Inject into the skin. (Patient taking differently: Inject 50 Units into the skin daily.), Disp: , Rfl:    lanthanum (FOSRENOL) 1000 MG chewable tablet, Chew by mouth., Disp: , Rfl:    omeprazole  (PRILOSEC) 20 MG capsule, Take 1 capsule by mouth once daily, Disp: 90 capsule, Rfl: 0   ondansetron  (ZOFRAN ) 4 MG tablet, Take 1 tablet (4 mg total) by mouth every 6 (six) hours., Disp: 12 tablet, Rfl: 0   rosuvastatin  (CRESTOR ) 10 MG tablet, Take 1 tablet by mouth once daily, Disp: 90 tablet, Rfl: 0   sevelamer  carbonate (RENVELA ) 800 MG tablet,  Take 2 tablets (1,600 mg total) by mouth 3 (three) times daily with meals., Disp: , Rfl:    Review of Systems:   Negative unless otherwise specified per HPI.  Vitals:   Vitals:   08/29/23 1322  BP: 124/60  Pulse: 82  Temp: 98.2 F (36.8 C)  TempSrc: Temporal  SpO2: 97%  Weight: 221 lb 4 oz (100.4 kg)  Height: 5' 8 (1.727 m)     Body mass index is 33.64 kg/m.  Physical Exam:   Physical Exam Vitals and nursing note reviewed.  Constitutional:      Appearance: He is well-developed.  HENT:     Head: Normocephalic.  Eyes:     Conjunctiva/sclera: Conjunctivae normal.     Pupils: Pupils are equal, round, and reactive to light.  Pulmonary:     Effort: Pulmonary effort is normal.  Musculoskeletal:        General: Normal range of motion.     Cervical back: Normal range of motion.     Comments: Approximately 3 inch area of superficial skin loss to left calf  Feet:     Comments: Ecchymosis to top of left foot No sensation to foot Induration to dorsal aspect Skin loss to second digit without discharge presence Skin:    General: Skin is warm and dry.  Neurological:     Mental Status: He is alert and oriented to person, place, and time.  Psychiatric:         Behavior: Behavior normal.        Thought Content: Thought content normal.        Judgment: Judgment normal.        Assessment and Plan:   Assessment and Plan Assessment & Plan Type 2 diabetes mellitus with right foot ulcer and diabetic neuropathy Right foot ulcer with possible osteomyelitis due to poor blood flow and potential infection. Diabetic neuropathy present. Blood sugar levels suboptimal. High risk of infection and tissue necrosis. - Refer to emergency room for urgent evaluation and management. - Order imaging studies such as X-ray or CT scan to assess for osteomyelitis. - Administer IV antibiotics for potential infection.  End-stage renal disease on hemodialysis Currently on hemodialysis.  Superficial abrasion, left lower leg Superficial abrasion likely due to scratching. No infection signs. - Apply bandage to the abrasion.     Lucie Buttner, PA-C

## 2023-08-29 NOTE — ED Provider Notes (Signed)
  EMERGENCY DEPARTMENT AT [facility] Provider Note   CSN: [id number] Arrival date & time: 08/29/23  1401     Patient presents with: Toe Pain   Edgar Brewer is a 63 y.o. male.   Patient with history of diabetes, high cholesterol, ESRD compliant --presents to the emergency department for evaluation of left second toe ulceration, discoloration of the toe moving up into the foot.  Ulceration was noted about 1 and half weeks ago.  No injuries reported.  Patient has a history of diabetic neuropathy, does not have any significant pain.  Denies fevers.  Reports history of wounds on his ankles in the past that have resolved.  He does have a superficial abrasion on the left posterior ankle from where he has been scratching recently.  Patient seen by PCP, referred to the emergency department.  Patient states that time of my exam that he does not want to be admitted to the hospital.  ABI/TBI 02/2022:  Summary:  Right: Resting right ankle-brachial index indicates noncompressible right  lower extremity arteries. The right toe-brachial index is abnormal.   Left: Resting left ankle-brachial index indicates noncompressible left  lower extremity arteries. The left toe-brachial index is abnormal.          Prior to Admission medications   Medication Sig Start Date End Date Taking? Authorizing Provider  aspirin  EC 81 MG tablet Take 81 mg by mouth daily. Swallow whole.    [provider]  b complex-vitamin c -folic acid  (NEPHRO-VITE) 0.8 MG TABS tablet Take 1 tablet by mouth in the morning.    [provider]  BD PEN NEEDLE SHORT ULTRAFINE 31G X 8 MM MISC USE TO INJECT INSULIN  INTO THE SKIN DAILY. 07/31/23   Katrinka Garnette KIDD, MD  cinacalcet  (SENSIPAR ) 60 MG tablet Take 60 mg by mouth every Tuesday, Thursday, and Saturday at 6 PM. In the evening.    [provider]  diclofenac  Sodium (VOLTAREN  ARTHRITIS PAIN) 1 % GEL Apply 2 g topically 4 (four) times daily. For  knee or hand arthritis pain 06/15/23   Katrinka Garnette KIDD, MD  gabapentin  (NEURONTIN ) 100 MG capsule TAKE 1 CAPSULE BY MOUTH THREE TIMES DAILY 07/13/23   Katrinka Garnette KIDD, MD  insulin  glargine-yfgn (SEMGLEE ) 100 UNIT/ML Pen Inject into the skin. Patient taking differently: Inject 50 Units into the skin daily. 08/01/23   [provider]  lanthanum (FOSRENOL) 1000 MG chewable tablet Chew by mouth. 05/24/21   [provider]  omeprazole  (PRILOSEC) 20 MG capsule Take 1 capsule by mouth once daily 07/13/23   Katrinka Garnette KIDD, MD  ondansetron  (ZOFRAN ) 4 MG tablet Take 1 tablet (4 mg total) by mouth every 6 (six) hours. 04/14/23   Mannie Fairy DASEN, DO  rosuvastatin  (CRESTOR ) 10 MG tablet Take 1 tablet by mouth once daily 08/07/23   Katrinka Garnette KIDD, MD  sevelamer  carbonate (RENVELA ) 800 MG tablet Take 2 tablets (1,600 mg total) by mouth 3 (three) times daily with meals. 10/22/18   Cheryle Page, MD    Allergies: Codeine and Clindamycin /lincomycin    Review of Systems  Updated Vital Signs BP (!) 146/65 (BP Location: Right Arm)   Pulse 85   Temp 98.2 F (36.8 C) (Oral)   Resp 18   SpO2 98%   Physical Exam Vitals and nursing note reviewed.  Constitutional:      Appearance: He is well-developed.  HENT:     Head: Normocephalic and atraumatic.  Eyes:     Conjunctiva/sclera: Conjunctivae normal.  Cardiovascular:     Pulses:          Dorsalis pedis pulses are 2+ on the right side and detected w/ Doppler on the left side.  Pulmonary:     Effort: No respiratory distress.  Musculoskeletal:     Cervical back: Normal range of motion and neck supple.     Comments: Left foot: Ulceration to the dorsum of the left second toe with violaceous discoloration that extends onto the distal portion of the toe.  No drainage.  Skin:    General: Skin is warm and dry.     Findings: Erythema present.  Neurological:     Mental Status: He is alert.     (all labs ordered are listed, but only  abnormal results are displayed) Labs Reviewed  CBC WITH DIFFERENTIAL/PLATELET - Abnormal; Notable for the following components:      Result Value   RBC 3.80 (*)    Hemoglobin 12.2 (*)    HCT 36.5 (*)    All other components within normal limits  COMPREHENSIVE METABOLIC PANEL WITH GFR - Abnormal; Notable for the following components:   Potassium 5.6 (*)    Chloride 93 (*)    Glucose, Bld 180 (*)    BUN 34 (*)    Creatinine, Ser 9.78 (*)    Calcium  11.0 (*)    Alkaline Phosphatase 366 (*)    GFR, Estimated 5 (*)    All other components within normal limits    EKG: None  Radiology: DG Foot Complete Left Result Date: 08/29/2023 CLINICAL DATA:  2nd toe ulceration/cellulitis. EXAM: LEFT FOOT - COMPLETE 3+ VIEW COMPARISON:  None Available. FINDINGS: Diffuse vascular calcifications. No acute bony abnormality. Specifically, no fracture, subluxation, or dislocation. No bone destruction to suggest osteomyelitis. Joint spaces maintained. IMPRESSION: No acute bony abnormality. Diffuse vascular calcifications. Electronically Signed   By: Franky Crease M.D.   On: 08/29/2023 15:25     Procedures   Medications Ordered in the ED  doxycycline  (VIBRA -TABS) tablet 100 mg (100 mg Oral Given 08/29/23 1557)  amoxicillin  (AMOXIL ) capsule 500 mg (500 mg Oral Given 08/29/23 1557)    ED Course  Patient seen and examined. History obtained directly from patient.   Labs/EKG: Ordered CBC, CMP.  Imaging: Ordered x-ray of the left foot.  Medications/Fluids: None ordered  Most recent vital signs reviewed and are as follows: BP (!) 146/65 (BP Location: Right Arm)   Pulse 85   Temp 98.2 F (36.8 C) (Oral)   Resp 18   SpO2 98%   Initial impression: Left toe ulceration, history of diabetes.  Possible associated cellulitis given discoloration of the toe and distal portion of the foot.  4:01 PM Reassessment performed. Patient appears stable. Discussed case with Dr. Mannie who has seen patient.   Labs  personally reviewed and interpreted including: CBC white blood cell count is normal, hemoglobin slightly low but at baseline at 12.2; CMP potassium elevated 5.6, glucose 180, BUN and creatinine elevated.   Imaging personally visualized and interpreted including: X-ray without signs of osteo, diffuse vascular calcifications noted.  Reviewed pertinent lab work and imaging with patient at bedside. Questions answered.  Most current vital signs reviewed and are as follows: BP (!) 146/65 (BP Location: Right Arm)   Pulse 85   Temp 98.2 F (36.8 C) (Oral)   Resp 18   SpO2 98%   Plan: Discussed case with Dr. Mannie who has seen patient.  Patient does not want to stay in the hospital.  Will give trial of oral antibiotics.  Will give doxycycline  twice daily, amoxicillin  500 mg daily (dose adjusted for ESRD). Will give podiatry referral.   Discussed signs and symptoms that should cause return.  This includes worsening pain and swelling, fevers, redness spreading beyond the base of the toe.  4:26 PM Reassessment performed.  We reviewed plan and follow-up plan.  Discussed that he needs to monitor the wound very closely and strongly consider return to the emergency department with worsening.  We discussed that he is at high risk for worsening infection, spread of infection to the bone, which may ultimately result in needing amputation.  Discussed that he should be seen by PCP in the in 2 to 3 days for recheck.  Also given podiatry referral and encouraged to call to make an appointment.  Most current vital signs reviewed and are as follows: BP (!) 146/65 (BP Location: Right Arm)   Pulse 85   Temp 98.2 F (36.8 C) (Oral)   Resp 18   SpO2 98%   Plan: Discharge to home.   Prescriptions written for: Amoxicillin , doxycycline .  He would like to pick these up in the pharmacy here today.  I sent him upstairs for him to pick up on his way out.  Other home care instructions discussed: Monitoring of  wound  ED return instructions discussed: Return with redness that is spreading, drainage from the wound, fevers, lethargy, nausea or vomiting, new or worsening symptoms.  Follow-up instructions discussed: Patient encouraged to follow-up with their PCP in 2-3 days.                                     Medical Decision Making Amount and/or Complexity of Data Reviewed Labs: ordered. Radiology: ordered.  Risk Prescription drug management.   Patient who has developed ulcer of second toe of left foot.  He is diabetic.  He has peripheral arterial disease.  Pulses are present in the feet today.  Ulcer is progressive.  Lab workup overall reassuring.  No evidence of osteomyelitis on x-ray today, however symptoms only present for about a week and a half.  He is high risk for worsening and progression.  Discussed admission.  Patient is averse to admission.  He is willing to try oral antibiotics.  These were prescribed for him today.  We did discuss possibility of illness progressing to needing amputation.  Given outpatient podiatry follow-up.  Discussed importance of close follow-up, close monitoring of symptoms, return with worsening.     Final diagnoses:  Skin ulcer of toe of left foot, limited to breakdown of skin (HCC)  Cellulitis of foot, left    ED Discharge Orders          Ordered    doxycycline  (VIBRAMYCIN ) 100 MG capsule  2 times daily        08/29/23 1616    amoxicillin  (AMOXIL ) 500 MG capsule  Daily        08/29/23 1616               Desiderio Chew, PA-C 08/29/23 2313    Mannie Pac T, DO 08/29/23 2358

## 2023-08-29 NOTE — Telephone Encounter (Signed)
 FYI Only or Action Required?: FYI only for provider.  Patient was last seen in primary care on 06/15/2023 by Katrinka Garnette KIDD, MD.  Called Nurse Triage reporting Toe Pain.  Symptoms began within the last month, first noticed yesterday at dialysis.  Interventions attempted: Nothing.  Symptoms are: unchanged.  Triage Disposition: See HCP Within 4 Hours (Or PCP Triage)  Patient/caregiver understands and will follow disposition?: Yes        Reason for Disposition  [1] Redness spreading into foot or red streak into foot AND [2] no fever  Answer Assessment - Initial Assessment Questions 1. ONSET: When did the pain start?      Was seen at dialysis 4 days ago and nothing was noticed, but yesterday when they checked his feet there was a spot on his left 2nd toe.  2. LOCATION: Where is the pain located?   (e.g., around nail, entire toe, at foot joint)      Left foot 2nd toe 3. PAIN: How bad is the pain?    (Scale 1-10; or mild, moderate, severe)     Intermittent stinging pain to the toe  4. APPEARANCE: What does the toe look like? (e.g., redness, swelling, bruising, pallor)     Scabby and brown, redness by nail  5. CAUSE: What do you think is causing the toe pain?     Unsure  6. OTHER SYMPTOMS: Do you have any other symptoms? (e.g., leg pain, rash, fever, numbness)     No  Protocols used: Toe Pain-A-AH

## 2023-08-30 ENCOUNTER — Other Ambulatory Visit (HOSPITAL_BASED_OUTPATIENT_CLINIC_OR_DEPARTMENT_OTHER): Payer: Self-pay

## 2023-08-30 ENCOUNTER — Telehealth: Payer: Self-pay | Admitting: Family Medicine

## 2023-08-30 ENCOUNTER — Encounter (HOSPITAL_COMMUNITY): Payer: Self-pay

## 2023-08-30 NOTE — Telephone Encounter (Signed)
 We can work him in 11 40 tomorrow but let him know likely significant wait - but we do want to see him after Emergency Department visit yesterday

## 2023-08-30 NOTE — Telephone Encounter (Unsigned)
 Copied from CRM #[id number]. Topic: General - Other >> Aug 30, 2023 10:50 AM Gennette ORN wrote: Reason for CRM: Patient's mom [name] called in stating the paperwork from hospital states follow up appointment needs to be within 2 days. Please follow up.

## 2023-08-30 NOTE — Telephone Encounter (Signed)
 See below, do you want to work pt in or see available provider?

## 2023-09-07 ENCOUNTER — Ambulatory Visit (INDEPENDENT_AMBULATORY_CARE_PROVIDER_SITE_OTHER): Payer: Medicare (Managed Care) | Admitting: Podiatry

## 2023-09-07 DIAGNOSIS — I739 Peripheral vascular disease, unspecified: Secondary | ICD-10-CM

## 2023-09-07 DIAGNOSIS — L97522 Non-pressure chronic ulcer of other part of left foot with fat layer exposed: Secondary | ICD-10-CM | POA: Diagnosis not present

## 2023-09-07 NOTE — Progress Notes (Signed)
 Subjective:  Patient ID: Edgar Brewer, male    DOB: Jul 06, 1960,  MRN: [id number]  Chief Complaint  Patient presents with   Foot Ulcer    Mother is with him today due to his vision issues. Left 2nd toe ulcer. Noticed the toe was ugly looking a week and a half ago. Per patient's mom the toe does look better. He does not feel much pain in the foot. Last A1c: 7.0, no anticoag.     63 y.o. male presents with concern for ulcer on the left second toe.  Patient reports that the wound is relatively stable no drainage.  He does have a history of vascular problems on the left leg.  Has seen vascular surgery in the past but he thinks it has been several years or more.   Past Medical History:  Diagnosis Date   Acute on chronic combined systolic and diastolic CHF, NYHA class 1 (HCC) 01/13/2015   Allergy    Arthritis    Chronic kidney disease    MWF [facility].   Diabetes mellitus type 2 with complications (HCC)    Previous insulin , non-insulin  requiring noted 09/2018.  Complications include retinopathy, peripheral neuropathy.   GERD (gastroesophageal reflux disease)    at times   Head injury with loss of consciousness (HCC)    History of kidney stones    Hypertension    Neuromuscular disorder (HCC)    Neuropathy    Pulmonary hypertension (HCC)    PA peak pressure 41 mmHg 03/04/16 echo   Stroke [facility])    no residual weakness   Varicose veins of lower extremities with complications 12/24/2014    Allergies  Allergen Reactions   Codeine Anaphylaxis, Hives, Swelling and Other (See Comments)    Swelling all over body and the throat   Clindamycin /Lincomycin Nausea And Vomiting    ROS: Negative except as per HPI above  Objective:  General: AAO x3, NAD  Dermatological: Dry gangrene with dorsal necrosis of the tissue of the left second toe.  No evidence of active drainage or malodor no significant erythema.  Vascular: Nonpalpable DP and PT pulse in the left foot, left second toe is slightly cool  to touch distally with diminished cap refill  Neruologic: Grossly intact to left foot  Musculoskeletal: No gross boney pedal deformities   Gait: Unassisted, Nonantalgic.     Radiographs:  Date: 08/29/23 XR the left foot Weightbearing AP/Lateral/Oblique   Findings: Diffuse vascular calcifications. No acute bony abnormality. Specifically, no fracture, subluxation, or dislocation. No bone destruction to suggest osteomyelitis. Joint spaces maintained.   IMPRESSION: No acute bony abnormality.   Diffuse vascular calcifications.  ABI 03/17/22: Right: Resting right ankle-brachial index indicates noncompressible right  lower extremity arteries. The right toe-brachial index is abnormal.   Left: Resting left ankle-brachial index indicates noncompressible left  lower extremity arteries. The left toe-brachial index is abnormal.   Assessment:   1. Ulcer of toe, left, with fat layer exposed (HCC)   2. PAD (peripheral artery disease) (HCC)      Plan:  Patient was evaluated and treated and all questions answered.  #  dry gangrene of left second toe - Discussed with patient there is no evidence of active infection in the left second toe however there are gangrenous changes and he may very well need a left second toe amputation at some point in the near future - However he first requires evaluation by the vascular surgery team and I will place a referral for that today. - Recommend  in the meantime to use Betadine solution and pat on the gangrenous black area of the left second toe once daily. - Instructed him to go to the ED if there is significant redness swelling malodor or drainage from the left second toe that worsens acutely - Will follow-up with me in 1 month hopefully after having seen vascular          Edgar Brewer, DPM [facility] / [facility]

## 2023-09-13 ENCOUNTER — Encounter (HOSPITAL_COMMUNITY): Payer: Self-pay

## 2023-09-19 ENCOUNTER — Other Ambulatory Visit: Payer: Self-pay

## 2023-09-19 ENCOUNTER — Ambulatory Visit (INDEPENDENT_AMBULATORY_CARE_PROVIDER_SITE_OTHER): Payer: Medicare (Managed Care) | Admitting: Family Medicine

## 2023-09-19 ENCOUNTER — Inpatient Hospital Stay (HOSPITAL_COMMUNITY)
Admission: EM | Admit: 2023-09-19 | Discharge: 2023-09-22 | DRG: 252 | Disposition: A | Discharge status: Home Health | Payer: Medicare (Managed Care) | Attending: Internal Medicine | Admitting: Internal Medicine

## 2023-09-19 ENCOUNTER — Encounter (HOSPITAL_COMMUNITY): Payer: Self-pay

## 2023-09-19 ENCOUNTER — Ambulatory Visit (INDEPENDENT_AMBULATORY_CARE_PROVIDER_SITE_OTHER): Payer: Medicare (Managed Care)

## 2023-09-19 ENCOUNTER — Encounter: Payer: Self-pay | Admitting: Family Medicine

## 2023-09-19 ENCOUNTER — Ambulatory Visit: Payer: Self-pay | Admitting: Family Medicine

## 2023-09-19 VITALS — BP 108/70 | HR 85 | Temp 98.2°F | Ht 68.0 in | Wt 218.6 lb

## 2023-09-19 DIAGNOSIS — L97529 Non-pressure chronic ulcer of other part of left foot with unspecified severity: Secondary | ICD-10-CM | POA: Diagnosis present

## 2023-09-19 DIAGNOSIS — Z992 Dependence on renal dialysis: Secondary | ICD-10-CM | POA: Diagnosis not present

## 2023-09-19 DIAGNOSIS — E11319 Type 2 diabetes mellitus with unspecified diabetic retinopathy without macular edema: Secondary | ICD-10-CM | POA: Diagnosis present

## 2023-09-19 DIAGNOSIS — E1122 Type 2 diabetes mellitus with diabetic chronic kidney disease: Secondary | ICD-10-CM | POA: Diagnosis present

## 2023-09-19 DIAGNOSIS — N2581 Secondary hyperparathyroidism of renal origin: Secondary | ICD-10-CM | POA: Diagnosis present

## 2023-09-19 DIAGNOSIS — I96 Gangrene, not elsewhere classified: Secondary | ICD-10-CM

## 2023-09-19 DIAGNOSIS — I70262 Atherosclerosis of native arteries of extremities with gangrene, left leg: Secondary | ICD-10-CM | POA: Diagnosis present

## 2023-09-19 DIAGNOSIS — Z885 Allergy status to narcotic agent status: Secondary | ICD-10-CM | POA: Diagnosis not present

## 2023-09-19 DIAGNOSIS — E1165 Type 2 diabetes mellitus with hyperglycemia: Secondary | ICD-10-CM | POA: Diagnosis present

## 2023-09-19 DIAGNOSIS — E1152 Type 2 diabetes mellitus with diabetic peripheral angiopathy with gangrene: Principal | ICD-10-CM | POA: Diagnosis present

## 2023-09-19 DIAGNOSIS — E1140 Type 2 diabetes mellitus with diabetic neuropathy, unspecified: Secondary | ICD-10-CM | POA: Diagnosis present

## 2023-09-19 DIAGNOSIS — I679 Cerebrovascular disease, unspecified: Secondary | ICD-10-CM | POA: Diagnosis not present

## 2023-09-19 DIAGNOSIS — E871 Hypo-osmolality and hyponatremia: Secondary | ICD-10-CM | POA: Diagnosis present

## 2023-09-19 DIAGNOSIS — Z9889 Other specified postprocedural states: Secondary | ICD-10-CM | POA: Diagnosis not present

## 2023-09-19 DIAGNOSIS — Z48812 Encounter for surgical aftercare following surgery on the circulatory system: Secondary | ICD-10-CM | POA: Diagnosis not present

## 2023-09-19 DIAGNOSIS — L089 Local infection of the skin and subcutaneous tissue, unspecified: Secondary | ICD-10-CM

## 2023-09-19 DIAGNOSIS — E1142 Type 2 diabetes mellitus with diabetic polyneuropathy: Secondary | ICD-10-CM | POA: Diagnosis present

## 2023-09-19 DIAGNOSIS — K219 Gastro-esophageal reflux disease without esophagitis: Secondary | ICD-10-CM | POA: Diagnosis present

## 2023-09-19 DIAGNOSIS — Z23 Encounter for immunization: Secondary | ICD-10-CM

## 2023-09-19 DIAGNOSIS — I132 Hypertensive heart and chronic kidney disease with heart failure and with stage 5 chronic kidney disease, or end stage renal disease: Secondary | ICD-10-CM | POA: Diagnosis present

## 2023-09-19 DIAGNOSIS — Z6833 Body mass index (BMI) 33.0-33.9, adult: Secondary | ICD-10-CM

## 2023-09-19 DIAGNOSIS — Z801 Family history of malignant neoplasm of trachea, bronchus and lung: Secondary | ICD-10-CM | POA: Diagnosis not present

## 2023-09-19 DIAGNOSIS — Z83438 Family history of other disorder of lipoprotein metabolism and other lipidemia: Secondary | ICD-10-CM | POA: Diagnosis not present

## 2023-09-19 DIAGNOSIS — E66811 Obesity, class 1: Secondary | ICD-10-CM | POA: Diagnosis present

## 2023-09-19 DIAGNOSIS — D649 Anemia, unspecified: Secondary | ICD-10-CM | POA: Diagnosis present

## 2023-09-19 DIAGNOSIS — M1990 Unspecified osteoarthritis, unspecified site: Secondary | ICD-10-CM | POA: Diagnosis present

## 2023-09-19 DIAGNOSIS — E11621 Type 2 diabetes mellitus with foot ulcer: Secondary | ICD-10-CM | POA: Diagnosis present

## 2023-09-19 DIAGNOSIS — Z8261 Family history of arthritis: Secondary | ICD-10-CM

## 2023-09-19 DIAGNOSIS — Z8673 Personal history of transient ischemic attack (TIA), and cerebral infarction without residual deficits: Secondary | ICD-10-CM | POA: Diagnosis not present

## 2023-09-19 DIAGNOSIS — L97521 Non-pressure chronic ulcer of other part of left foot limited to breakdown of skin: Secondary | ICD-10-CM

## 2023-09-19 DIAGNOSIS — Z79899 Other long term (current) drug therapy: Secondary | ICD-10-CM

## 2023-09-19 DIAGNOSIS — E119 Type 2 diabetes mellitus without complications: Secondary | ICD-10-CM

## 2023-09-19 DIAGNOSIS — E875 Hyperkalemia: Secondary | ICD-10-CM | POA: Diagnosis present

## 2023-09-19 DIAGNOSIS — I272 Pulmonary hypertension, unspecified: Secondary | ICD-10-CM | POA: Diagnosis present

## 2023-09-19 DIAGNOSIS — Z822 Family history of deafness and hearing loss: Secondary | ICD-10-CM

## 2023-09-19 DIAGNOSIS — Z881 Allergy status to other antibiotic agents status: Secondary | ICD-10-CM

## 2023-09-19 DIAGNOSIS — I120 Hypertensive chronic kidney disease with stage 5 chronic kidney disease or end stage renal disease: Secondary | ICD-10-CM | POA: Diagnosis not present

## 2023-09-19 DIAGNOSIS — I5042 Chronic combined systolic (congestive) and diastolic (congestive) heart failure: Secondary | ICD-10-CM | POA: Diagnosis present

## 2023-09-19 DIAGNOSIS — N186 End stage renal disease: Secondary | ICD-10-CM | POA: Diagnosis present

## 2023-09-19 DIAGNOSIS — Z9862 Peripheral vascular angioplasty status: Secondary | ICD-10-CM | POA: Diagnosis not present

## 2023-09-19 DIAGNOSIS — Z7982 Long term (current) use of aspirin: Secondary | ICD-10-CM

## 2023-09-19 DIAGNOSIS — Z7984 Long term (current) use of oral hypoglycemic drugs: Secondary | ICD-10-CM

## 2023-09-19 DIAGNOSIS — Z794 Long term (current) use of insulin: Secondary | ICD-10-CM

## 2023-09-19 DIAGNOSIS — Z87891 Personal history of nicotine dependence: Secondary | ICD-10-CM | POA: Diagnosis not present

## 2023-09-19 DIAGNOSIS — Z8042 Family history of malignant neoplasm of prostate: Secondary | ICD-10-CM

## 2023-09-19 DIAGNOSIS — Z720 Tobacco use: Secondary | ICD-10-CM | POA: Diagnosis not present

## 2023-09-19 DIAGNOSIS — E7800 Pure hypercholesterolemia, unspecified: Secondary | ICD-10-CM | POA: Diagnosis present

## 2023-09-19 DIAGNOSIS — Z723 Lack of physical exercise: Secondary | ICD-10-CM

## 2023-09-19 DIAGNOSIS — Z8249 Family history of ischemic heart disease and other diseases of the circulatory system: Secondary | ICD-10-CM

## 2023-09-19 DIAGNOSIS — E7801 Familial hypercholesterolemia: Secondary | ICD-10-CM | POA: Diagnosis not present

## 2023-09-19 DIAGNOSIS — D631 Anemia in chronic kidney disease: Secondary | ICD-10-CM | POA: Diagnosis present

## 2023-09-19 LAB — CBC WITH DIFFERENTIAL/PLATELET
Abs Immature Granulocytes: 0.03 K/uL (ref 0.00–0.07)
Basophils Absolute: 0.1 K/uL (ref 0.0–0.1)
Basophils Relative: 1 %
Eosinophils Absolute: 0.1 K/uL (ref 0.0–0.5)
Eosinophils Relative: 2 %
HCT: 37.5 % — ABNORMAL LOW (ref 39.0–52.0)
Hemoglobin: 12.4 g/dL — ABNORMAL LOW (ref 13.0–17.0)
Immature Granulocytes: 0 %
Lymphocytes Relative: 12 %
Lymphs Abs: 0.9 K/uL (ref 0.7–4.0)
MCH: 32 pg (ref 26.0–34.0)
MCHC: 33.1 g/dL (ref 30.0–36.0)
MCV: 96.9 fL (ref 80.0–100.0)
Monocytes Absolute: 0.6 K/uL (ref 0.1–1.0)
Monocytes Relative: 7 %
Neutro Abs: 6 K/uL (ref 1.7–7.7)
Neutrophils Relative %: 78 %
Platelets: 230 K/uL (ref 150–400)
RBC: 3.87 MIL/uL — ABNORMAL LOW (ref 4.22–5.81)
RDW: 13.7 % (ref 11.5–15.5)
WBC: 7.6 K/uL (ref 4.0–10.5)
nRBC: 0 % (ref 0.0–0.2)

## 2023-09-19 LAB — COMPREHENSIVE METABOLIC PANEL WITH GFR
ALT: 24 U/L (ref 0–44)
AST: 23 U/L (ref 15–41)
Albumin: 3.5 g/dL (ref 3.5–5.0)
Alkaline Phosphatase: 298 U/L — ABNORMAL HIGH (ref 38–126)
Anion gap: 15 (ref 5–15)
BUN: 35 mg/dL — ABNORMAL HIGH (ref 8–23)
CO2: 23 mmol/L (ref 22–32)
Calcium: 9.8 mg/dL (ref 8.9–10.3)
Chloride: 91 mmol/L — ABNORMAL LOW (ref 98–111)
Creatinine, Ser: 10.67 mg/dL — ABNORMAL HIGH (ref 0.61–1.24)
GFR, Estimated: 5 mL/min — ABNORMAL LOW (ref >60)
Glucose, Bld: 275 mg/dL — ABNORMAL HIGH (ref 70–99)
Potassium: 5.7 mmol/L — ABNORMAL HIGH (ref 3.5–5.1)
Sodium: 129 mmol/L — ABNORMAL LOW (ref 135–145)
Total Bilirubin: 0.6 mg/dL (ref 0.0–1.2)
Total Protein: 7.4 g/dL (ref 6.5–8.1)

## 2023-09-19 LAB — I-STAT CG4 LACTIC ACID, ED: Lactic Acid, Venous: 1.2 mmol/L (ref 0.5–1.9)

## 2023-09-19 MED ORDER — SODIUM CHLORIDE 0.9 % IV SOLN: 2.0000 g | Freq: Once | INTRAVENOUS | Status: DC

## 2023-09-19 MED ORDER — SODIUM CHLORIDE 0.9 % IV SOLN
1.0000 g | Freq: Once | INTRAVENOUS | Status: AC
  Administered 2023-09-19: 1 g via INTRAVENOUS
  Filled 2023-09-19: qty 1

## 2023-09-19 MED ORDER — VANCOMYCIN HCL IN DEXTROSE 1-5 GM/200ML-% IV SOLN: 1000.0000 mg | Freq: Once | INTRAVENOUS | Status: DC

## 2023-09-19 MED ORDER — VANCOMYCIN HCL 1500 MG/300ML IV SOLN
1500.0000 mg | Freq: Once | INTRAVENOUS | Status: AC
  Administered 2023-09-19: 1500 mg via INTRAVENOUS
  Filled 2023-09-19: qty 300

## 2023-09-19 NOTE — ED Notes (Signed)
 Gave patient sandwich bag and drink

## 2023-09-19 NOTE — ED Provider Notes (Signed)
 [place] EMERGENCY DEPARTMENT AT [facility] Provider Note   CSN: [id number] Arrival date & time: 09/19/23  1528     Patient presents with: Toe Pain   Edgar Brewer is a 63 y.o. male.   63 y.o male with a PMH of DM, ESR MWF presents to the ED from Dr. San office with a chief complaitn of left toe pain x 2 weeks. Patient evaluated by PCP today, reports he took approximately 1 week of antibiotics without any improvement in his symptoms.  He was previously managed by Dr. Malvin of podiatry who he last saw on 09/07/2023 for the same complaint.  He tells me he has not been running any fevers, has not been running any chills.  Apparently according to that visit patient was supposed to have a clearance by vascular prior to toe amputation but wife at the bedside reports this appointment is not until October.  Denies any other complaints.  The history is provided by the patient.  Toe Pain This is a new problem. The current episode started 2 days ago. The problem has not changed since onset.Pertinent negatives include no chest pain, no abdominal pain and no shortness of breath. Nothing relieves the symptoms.       Prior to Admission medications   Medication Sig Start Date End Date Taking? Authorizing Provider  aspirin  EC 81 MG tablet Take 81 mg by mouth daily. Swallow whole.    [provider]  b complex-vitamin c -folic acid  (NEPHRO-VITE) 0.8 MG TABS tablet Take 1 tablet by mouth in the morning.    [provider]  BD PEN NEEDLE SHORT ULTRAFINE 31G X 8 MM MISC USE TO INJECT INSULIN  INTO THE SKIN DAILY. 07/31/23   Katrinka Garnette KIDD, MD  cinacalcet  (SENSIPAR ) 60 MG tablet Take 60 mg by mouth every Tuesday, Thursday, and Saturday at 6 PM. In the evening.    [provider]  gabapentin  (NEURONTIN ) 100 MG capsule TAKE 1 CAPSULE BY MOUTH THREE TIMES DAILY 07/13/23   Katrinka Garnette KIDD, MD  insulin  glargine-yfgn (SEMGLEE ) 100 UNIT/ML Pen Inject into the skin.  08/01/23   [provider]  omeprazole  (PRILOSEC) 20 MG capsule Take 1 capsule by mouth once daily 07/13/23   Katrinka Garnette KIDD, MD  ondansetron  (ZOFRAN ) 4 MG tablet Take 1 tablet (4 mg total) by mouth every 6 (six) hours. 04/14/23   Mannie Fairy DASEN, DO  rosuvastatin  (CRESTOR ) 10 MG tablet Take 1 tablet by mouth once daily 08/07/23   Katrinka Garnette KIDD, MD  sevelamer  carbonate (RENVELA ) 800 MG tablet Take 2 tablets (1,600 mg total) by mouth 3 (three) times daily with meals. 10/22/18   Cheryle Page, MD    Allergies: Codeine and Clindamycin /lincomycin    Review of Systems  Constitutional:  Negative for chills and fever.  Respiratory:  Negative for shortness of breath.   Cardiovascular:  Negative for chest pain.  Gastrointestinal:  Negative for abdominal pain.  Genitourinary:  Negative for flank pain.  Musculoskeletal:  Negative for back pain.  Neurological:  Negative for syncope and facial asymmetry.  All other systems reviewed and are negative.   Updated Vital Signs BP 132/64   Pulse 82   Temp 98.6 F (37 C) (Oral)   Resp (!) 21   Ht 5' 8 (1.727 m)   Wt 98.9 kg   SpO2 100%   BMI 33.15 kg/m   Physical Exam Vitals and nursing note reviewed.  Constitutional:      Appearance: Normal appearance.  HENT:  Head: Normocephalic and atraumatic.     Nose: Nose normal.  Eyes:     Pupils: Pupils are equal, round, and reactive to light.  Cardiovascular:     Rate and Rhythm: Normal rate.     Pulses:          Dorsalis pedis pulses are detected w/ Doppler on the left side.       Posterior tibial pulses are detected w/ Doppler on the left side.  Pulmonary:     Effort: Pulmonary effort is normal.  Abdominal:     General: Abdomen is flat.  Musculoskeletal:     Cervical back: Normal range of motion.  Feet:     Left foot:     Skin integrity: Ulcer, skin breakdown, erythema and warmth present.     Toenail Condition: Left toenails are abnormally thick.     Comments: Increased  warmth, erythema, ulcer with likely gangrene present. Skin:    General: Skin is warm and dry.     Findings: Erythema present.  Neurological:     Mental Status: He is alert and oriented to person, place, and time.        (all labs ordered are listed, but only abnormal results are displayed) Labs Reviewed  COMPREHENSIVE METABOLIC PANEL WITH GFR - Abnormal; Notable for the following components:      Result Value   Sodium 129 (*)    Potassium 5.7 (*)    Chloride 91 (*)    Glucose, Bld 275 (*)    BUN 35 (*)    Creatinine, Ser 10.67 (*)    Alkaline Phosphatase 298 (*)    GFR, Estimated 5 (*)    All other components within normal limits  CBC WITH DIFFERENTIAL/PLATELET - Abnormal; Notable for the following components:   RBC 3.87 (*)    Hemoglobin 12.4 (*)    HCT 37.5 (*)    All other components within normal limits  CULTURE, BLOOD (ROUTINE X 2)  CULTURE, BLOOD (ROUTINE X 2)  I-STAT CG4 LACTIC ACID, ED    EKG: None  Radiology: DG Foot Complete Left Result Date: 09/19/2023 CLINICAL DATA:  Left foot ulcer.  Possible gangrene. EXAM: LEFT FOOT - COMPLETE 3+ VIEW COMPARISON:  None Available. FINDINGS: There is no evidence of fracture or dislocation. There is no evidence of arthropathy or other focal bone abnormality. Vascular calcifications are noted. IMPRESSION: No fracture or dislocation is noted. No definite bony abnormality is noted. Electronically Signed   By: Lynwood Landy Raddle M.D.   On: 09/19/2023 12:48     Procedures   Medications Ordered in the ED  vancomycin  (VANCOREADY) IVPB 1500 mg/300 mL (has no administration in time range)  ceFEPIme  (MAXIPIME ) 1 g in sodium chloride  0.9 % 100 mL IVPB (has no administration in time range)                                    Medical Decision Making Amount and/or Complexity of Data Reviewed Radiology: ordered.  Risk Prescription drug management. Decision regarding hospitalization.    This patient presents to the ED for  concern of toe , this involves a number of treatment options, and is a complaint that carries with it a high risk of complications and morbidity.  The differential diagnosis includes cellulitis, osteomyelitis versus diabetic foot.     Co morbidities: Discussed in HPI   Brief History:  See HPI.   EMR reviewed including  pt PMHx, past surgical history and past visits to ER.   See HPI for more details   Lab Tests:  I ordered and independently interpreted labs.  The pertinent results include:    CBC with no leukocytosis, hemoglobin is within his baseline. CMP with low sodium, hyperkalemia, creatine is worsen but he is on dialysis MWF, LFTs are within normal limits. Lactic acid is negative.   Imaging Studies:  Xray of the left foot showed: No fracture or dislocation is noted. No definite bony abnormality is noted.  Medicines ordered:  I ordered medication including vancomycin , cefepime   for diabetic foot Reevaluation of the patient after these medicines showed that the patient stayed the same I have reviewed the patients home medicines and have made adjustments as needed  Consults:  I requested consultation with Dr. Malvin podiatry,  and discussed lab and imaging findings as well as pertinent plan - they recommend: vascular clearance and will round on patient tomorrow am.  - Consultation with Dr. Serene of vascular surgery who will evaluate patient in the morning, will also have Angiogram  Reevaluation:  After the interventions noted above I re-evaluated patient and found that they have :improved  Social Determinants of Health:  The patient's social determinants of health were a factor in the care of this patient  Problem List / ED Course:  Patient presents to the ED with a left foot infection that has been ongoing for the past 2 weeks.  Completed outpatient antibiotics previously followed by podiatry Dr. Malvin and reports he finished these antibiotics.  He is  evaluated by his PCP today Dr. Katrinka who sent him to the emergency department as wound appears to be worsening.  According to his records which I have reviewed, looks like patient was waiting for clearance from vascular surgery in order to undergo toe amputation. Please see photos attached.  No leukocytosis, electrolytes are deranged with low sodium, increase in potassium dialysis patient and elevated creatinine.  I have not reached out to the renal navigator but patient does need dialysis tomorrow.  Lactic acid was also negative.  Due to increasing erythema and warmth I do feel that patient warrants antibiotic therapy at this time with vancomycin  and cefepime . X-ray of the foot earlier did not show any acute findings.  I did ask podiatry whether more imaging was needed at this time, and they did not feel like this was necessary.  I also reached out to vascular surgery who will valuate patient tomorrow and perform a angiogram he is to be n.p.o. after midnight.  Patient remains hemodynamically stable for admission. I spoke to Dr. Charlton who agrees on admission at this time, appreciate his assistance.   Dispostion:  After consideration of the diagnostic results and the patients response to treatment, I feel that the patent would benefit from admission for further management of toe infection.      Final diagnoses:  Gangrene of left foot (HCC)  Toe infection    ED Discharge Orders     None          Maureen Broad, PA-C 09/19/23 2142    Lenor Hollering, MD 09/19/23 2314

## 2023-09-19 NOTE — ED Notes (Signed)
 Called floor to advise patient in route, floor receptive.

## 2023-09-19 NOTE — ED Provider Triage Note (Signed)
 Emergency Medicine Provider Triage Evaluation Note  Calven Gilkes , a 63 y.o. male  was evaluated in triage.  Pt complains of toe infection. Report infection to left 2nd toe ongoing for 2 weeks.  Was on abx but now my toe is rotten off.  Denies fever, chills.  Hx of ESRD also on MWF dialysis.  Seen by PCP today and recommend getting admitted for his toe infection  Review of Systems  Positive: As above Negative: As above  Physical Exam  BP (!) 152/62 (BP Location: Right Arm)   Pulse 94   Temp 98.3 F (36.8 C)   Resp 17   Ht 5' 8 (1.727 m)   Wt 98.9 kg   SpO2 93%   BMI 33.15 kg/m  Gen:   Awake, no distress   Resp:  Normal effort  MSK:   Moves extremities without difficulty  Other:    Medical Decision Making  Medically screening exam initiated at 4:03 PM.  Appropriate orders placed.  Valentin Benney was informed that the remainder of the evaluation will be completed by another provider, this initial triage assessment does not replace that evaluation, and the importance of remaining in the ED until their evaluation is complete.     Nivia Colon, PA-C 09/19/23 1605

## 2023-09-19 NOTE — Patient Instructions (Addendum)
 X-ray today  Call Dr. Jarold back and get appointment- see if they can help in person with the billing issue but we also need to keep you in long term care with him- If he has another low cost option I can refer you  I am very concerned about the toe and need for more immediate amputation- getting updated x-ray and trying to reach out to podiatry and vascular surgery  Recommended follow up: Return for next already scheduled visit or sooner if needed. You may need to got emergency department either by phone call from me later or if the toe worsens

## 2023-09-19 NOTE — ED Triage Notes (Signed)
 Pt reports his left second toe is rotting and wants someone to look at it. It started with 2 black dots on the toe two weeks ago and now it is draining and is black. Denies fevers.  He goes to Dialysis MWF and has DM2.

## 2023-09-19 NOTE — Progress Notes (Signed)
 Phone [phone] In person visit   Subjective:   Edgar Brewer is a 63 y.o. year old very pleasant male patient who presents for/with See problem oriented charting Chief Complaint  Patient presents with   Hospitalization Follow-up   Toe Injury    Left second toe issue;    Medical Management of Chronic Issues    Would like eye doctor referral     Past Medical History-  Patient Active Problem List   Diagnosis Date Noted   History of stroke 02/25/2020    Priority: High   ESRD (end stage renal disease) on dialysis (HCC) 10/03/2018    Priority: High   Diabetes mellitus, type II, insulin  dependent (HCC) 01/14/2015    Priority: High   Chronic diastolic CHF (congestive heart failure) (HCC) 01/13/2015    Priority: High   Fatty liver 02/10/2022    Priority: Medium    GERD (gastroesophageal reflux disease) 03/25/2020    Priority: Medium    Aortic stenosis 02/25/2020    Priority: Medium    Type 2 diabetes mellitus with diabetic neuropathy, unspecified (HCC) 02/25/2020    Priority: Medium    Gallbladder abscess 11/15/2018    Priority: Medium    Elevated LFTs 10/18/2018    Priority: Medium    Other disorders of phosphorus metabolism 01/19/2018    Priority: Medium    Secondary hyperparathyroidism of renal origin (HCC) 09/27/2017    Priority: Medium    Essential hypertension 01/14/2015    Priority: Medium    Anemia of chronic kidney failure 01/14/2015    Priority: Medium    Varicose veins of lower extremities with complications 12/24/2014    Priority: Medium    Wound of left leg 05/08/2020    Priority: Low   Left leg cellulitis 05/08/2020    Priority: Low   CKD (chronic kidney disease), stage IV (HCC) 01/14/2015    Priority: Low   Obesity 01/14/2015    Priority: Low   Chronic venous insufficiency 10/22/2014    Priority: Low    Medications- reviewed and updated Current Outpatient Medications  Medication Sig Dispense Refill   aspirin  EC 81 MG tablet Take 81 mg by  mouth daily. Swallow whole.     b complex-vitamin c -folic acid  (NEPHRO-VITE) 0.8 MG TABS tablet Take 1 tablet by mouth in the morning.     BD PEN NEEDLE SHORT ULTRAFINE 31G X 8 MM MISC USE TO INJECT INSULIN  INTO THE SKIN DAILY. 100 each 0   cinacalcet  (SENSIPAR ) 60 MG tablet Take 60 mg by mouth every Tuesday, Thursday, and Saturday at 6 PM. In the evening.     gabapentin  (NEURONTIN ) 100 MG capsule TAKE 1 CAPSULE BY MOUTH THREE TIMES DAILY 270 capsule 0   insulin  glargine-yfgn (SEMGLEE ) 100 UNIT/ML Pen Inject into the skin.     omeprazole  (PRILOSEC) 20 MG capsule Take 1 capsule by mouth once daily 90 capsule 0   ondansetron  (ZOFRAN ) 4 MG tablet Take 1 tablet (4 mg total) by mouth every 6 (six) hours. 12 tablet 0   rosuvastatin  (CRESTOR ) 10 MG tablet Take 1 tablet by mouth once daily 90 tablet 0   sevelamer  carbonate (RENVELA ) 800 MG tablet Take 2 tablets (1,600 mg total) by mouth 3 (three) times daily with meals.     No current facility-administered medications for this visit.     Objective:  BP 108/70 (BP Location: Left Arm, Patient Position: Sitting, Cuff Size: Normal)   Pulse 85   Temp 98.2 F (36.8 C) (Temporal)   Ht 5'  8 (1.727 m)   Wt 218 lb 9.6 oz (99.2 kg)   SpO2 95%   BMI 33.24 kg/m  Gen: NAD, resting comfortably CV: RRR  Lungs: CTAB no crackles, wheeze, rhonchi Abdomen: soft/nontender/nondistended/normal bowel sounds. No rebound or guarding.  Ext: trace to 1+ edema on the left, minimal on the right.  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 No osteomyelitis on x-ray- thought ulcer that had not yet developed into osteomyelitis though he is high risk. No WBC elevation. He was seen by  podiatry on 8/14/ 25   He saw podiatry Dr. Malvin on 09/07/23- at this point noted as dry gangrene and high risk for amputation- no longer appeared infected. They recommended vascular surgery consult first and cleanse with betadine solution and pat dry once daily and return to Emergency Department if worsens  Mom has cleaned area and noted progressive odor and more discharge. More swelling above the area of dry gangrene though not tender A/P: Patient has recently seen podiatry and had developed dry gangrene-at this point there is some increased moisture and reported purulence at home as well as an odor in the room-I am concerned he may have developed wet gangrene or osteomyelitis-I am ordering updated x-ray - I attempted to reach vascular surgery to see if we could move it to his appointment - I also reached out to Dr. Squire am strongly considering sending him directly to the hospital but him interested in his input as he has recently seen him -On right third toe there is a slight callus formation and we discussed using callus doughnuts to offload  Addendum: Was able to secure message with both vascular surgery and Dr. Rolla could potentially work him in at 2 PM today but stated very high probability of going straight to emergency department after that and Dr. Malvin recommended emergency department with concern for acute infection-I have called and redirected him to [facility] emergency department since if admitted he will need dialysis there.  I am going to list this as wet gangrene in addition to the dry gangrene area that is expanding certainly open to opinion of emergency team -would recommend vascular consult on hospital side- may need CT angiogram per Dr. Malvin  # Diabetic retinopathy-patient typically has no cost for his injections but recently had $800 bill and this is not affordable for him so he has not returned for follow-up even though in need of potential  another injection and they have not been able to work through this by phone-I encouraged him to schedule in person appointment to going to discuss this situation-he considered referral to another provider but we only want to pursue that if we cannot resolve current issue Right third toe  #End-stage renal disease on hemodialysis since January 2019. thorugh related to diabetes #Secondary hyperparathyroidism S: Patient on dialysis followed by Dr. Dolan on MWF at [facility] site  -AV fistula 06/01/2016 by Dr. Oris A/P: Overall stable-continue close follow-up with nephrology -Patient compliant with Sensipar  for secondary hyperparathyroidism -On Renvela  phosphate binder  #Heart failure with  ejection fraction-follows with Dr. Merl essentially managed by dialysis-Lasix  in the past  #hypertension S: medication: Previous: Medication prior to dialysis-no current treatment but  well-controlled Home readings #s: Well-controlled at medicine-continue monitor A/P: Well-controlled on medicine-continue monitor  # Diabetes with neuropathy S: Medication: Long-acting insulin  50 units, gabapentin  300mg  daily limit per dialysis-going to try 100 mg 3 times a day- no sedation thankfully Lab Results  Component Value Date   HGBA1C 7.0 (A) 06/15/2023   HGBA1C 7.1 (A) 02/07/2023   HGBA1C 7.3 (A) 10/06/2022  A/P: Diabetes is well-controlled-continue current medication.  Neuropathy likely contributes to his lack of sensation with current toe issues  #history of stroke- now on aspirin  81mg - stutter lingers from this #hyperlipidemia S: Medication: Rosuvastatin  10 mg  A/P: Next time that we do blood work here would like to do lipid panel-he is overdue and apparently they are not able to complete this at dialysis  Recommended follow up: Return for next already scheduled visit or sooner if needed. Future Appointments  Date Time Provider Department Center  10/05/2023 11:30 AM Standiford, Marsa FALCON, DPM [facility]  [facility]  10/24/2023  2:20 PM [facility] ANNUAL WELLNESS VISIT 1 [facility]  10/24/2023  3:20 PM Katrinka Garnette KIDD, MD [facility]  11/23/2023 12:30 PM [facility]  11/23/2023  1:30 PM [facility] PA [facility]    Lab/Order associations:   ICD-10-CM   1. Wet gangrene (HCC)  I96     2. Dry gangrene (HCC)  I96 DG Foot Complete Left    3. Need for vaccination against Streptococcus pneumoniae using pneumococcal conjugate vaccine 7  Z23     4. Need for pneumococcal vaccine  Z23 Pneumococcal conjugate vaccine 20-valent (Prevnar 20)    5. Diabetes mellitus, type II, insulin  dependent (HCC)  E11.9    Z79.4      I personally spent a total of 50 minutes in the care of the patient today including preparing to see the patient, getting/reviewing separately obtained history, performing a medically appropriate exam/evaluation, counseling and educating, placing orders, referring and communicating with other health care professionals (vascular and podiatry team through secure messaging and reaching back out to patient), and documenting clinical information in the EHR.   No orders of the defined types were placed in this encounter.   Return precautions advised.  Garnette Katrinka, MD

## 2023-09-20 ENCOUNTER — Encounter (HOSPITAL_COMMUNITY): Payer: Self-pay | Admitting: Family Medicine

## 2023-09-20 ENCOUNTER — Encounter (HOSPITAL_COMMUNITY): Payer: Self-pay

## 2023-09-20 ENCOUNTER — Encounter (HOSPITAL_COMMUNITY)
Admission: EM | Disposition: A | Discharge status: Home Health | Payer: Self-pay | Source: Home / Self Care | Attending: Internal Medicine

## 2023-09-20 DIAGNOSIS — Z8673 Personal history of transient ischemic attack (TIA), and cerebral infarction without residual deficits: Secondary | ICD-10-CM

## 2023-09-20 DIAGNOSIS — E7801 Familial hypercholesterolemia: Secondary | ICD-10-CM

## 2023-09-20 DIAGNOSIS — L97529 Non-pressure chronic ulcer of other part of left foot with unspecified severity: Secondary | ICD-10-CM

## 2023-09-20 DIAGNOSIS — E1122 Type 2 diabetes mellitus with diabetic chronic kidney disease: Secondary | ICD-10-CM

## 2023-09-20 DIAGNOSIS — Z992 Dependence on renal dialysis: Secondary | ICD-10-CM

## 2023-09-20 DIAGNOSIS — Z720 Tobacco use: Secondary | ICD-10-CM

## 2023-09-20 DIAGNOSIS — Z48812 Encounter for surgical aftercare following surgery on the circulatory system: Secondary | ICD-10-CM

## 2023-09-20 DIAGNOSIS — E871 Hypo-osmolality and hyponatremia: Secondary | ICD-10-CM | POA: Diagnosis present

## 2023-09-20 DIAGNOSIS — I70262 Atherosclerosis of native arteries of extremities with gangrene, left leg: Secondary | ICD-10-CM

## 2023-09-20 DIAGNOSIS — E875 Hyperkalemia: Secondary | ICD-10-CM | POA: Diagnosis present

## 2023-09-20 DIAGNOSIS — I96 Gangrene, not elsewhere classified: Secondary | ICD-10-CM

## 2023-09-20 DIAGNOSIS — N186 End stage renal disease: Secondary | ICD-10-CM

## 2023-09-20 HISTORY — PX: LOWER EXTREMITY INTERVENTION: CATH118252

## 2023-09-20 HISTORY — PX: LOWER EXTREMITY ANGIOGRAPHY: CATH118251

## 2023-09-20 LAB — CBC
HCT: 34.4 % — ABNORMAL LOW (ref 39.0–52.0)
Hemoglobin: 11.5 g/dL — ABNORMAL LOW (ref 13.0–17.0)
MCH: 31.9 pg (ref 26.0–34.0)
MCHC: 33.4 g/dL (ref 30.0–36.0)
MCV: 95.6 fL (ref 80.0–100.0)
Platelets: 199 K/uL (ref 150–400)
RBC: 3.6 MIL/uL — ABNORMAL LOW (ref 4.22–5.81)
RDW: 13.8 % (ref 11.5–15.5)
WBC: 7.5 K/uL (ref 4.0–10.5)
nRBC: 0 % (ref 0.0–0.2)

## 2023-09-20 LAB — HEPATITIS B SURFACE ANTIGEN: Hepatitis B Surface Ag: NONREACTIVE

## 2023-09-20 LAB — POCT ACTIVATED CLOTTING TIME
Activated Clotting Time: 233 s
Activated Clotting Time: 187 s
Activated Clotting Time: 164 s

## 2023-09-20 LAB — BASIC METABOLIC PANEL WITH GFR
Anion gap: 14 (ref 5–15)
BUN: 41 mg/dL — ABNORMAL HIGH (ref 8–23)
CO2: 23 mmol/L (ref 22–32)
Calcium: 9.9 mg/dL (ref 8.9–10.3)
Chloride: 98 mmol/L (ref 98–111)
Creatinine, Ser: 11.5 mg/dL — ABNORMAL HIGH (ref 0.61–1.24)
GFR, Estimated: 4 mL/min — ABNORMAL LOW (ref >60)
Glucose, Bld: 161 mg/dL — ABNORMAL HIGH (ref 70–99)
Potassium: 5.6 mmol/L — ABNORMAL HIGH (ref 3.5–5.1)
Sodium: 135 mmol/L (ref 135–145)

## 2023-09-20 LAB — GLUCOSE, CAPILLARY
Glucose-Capillary: 174 mg/dL — ABNORMAL HIGH (ref 70–99)
Glucose-Capillary: 134 mg/dL — ABNORMAL HIGH (ref 70–99)
Glucose-Capillary: 143 mg/dL — ABNORMAL HIGH (ref 70–99)

## 2023-09-20 LAB — HIV ANTIBODY (ROUTINE TESTING W REFLEX): HIV Screen 4th Generation wRfx: REACTIVE — AB

## 2023-09-20 MED ORDER — CLOPIDOGREL BISULFATE 300 MG PO TABS
ORAL_TABLET | ORAL | Status: AC
Start: 2023-09-20 — End: 2023-09-20
  Filled 2023-09-20: qty 1

## 2023-09-20 MED ORDER — SODIUM CHLORIDE 0.9 % IV SOLN: 250.0000 mL | INTRAVENOUS | Status: AC | PRN

## 2023-09-20 MED ORDER — GABAPENTIN 100 MG PO CAPS
100.0000 mg | ORAL_CAPSULE | Freq: Three times a day (TID) | ORAL | Status: DC
  Administered 2023-09-20 – 2023-09-21 (×4): 100 mg via ORAL
  Filled 2023-09-20 (×4): qty 1

## 2023-09-20 MED ORDER — ACETAMINOPHEN 325 MG PO TABS: 650.0000 mg | ORAL_TABLET | Freq: Four times a day (QID) | ORAL | Status: DC | PRN

## 2023-09-20 MED ORDER — OXYCODONE HCL 5 MG PO TABS
5.0000 mg | ORAL_TABLET | Freq: Four times a day (QID) | ORAL | Status: DC | PRN
  Administered 2023-09-20: 5 mg via ORAL

## 2023-09-20 MED ORDER — INSULIN GLARGINE-YFGN 100 UNIT/ML ~~LOC~~ SOLN
20.0000 [IU] | Freq: Every day | SUBCUTANEOUS | Status: DC
  Administered 2023-09-21: 20 [IU] via SUBCUTANEOUS
  Filled 2023-09-20 (×3): qty 0.2

## 2023-09-20 MED ORDER — SODIUM ZIRCONIUM CYCLOSILICATE 10 G PO PACK
10.0000 g | PACK | Freq: Once | ORAL | Status: AC
  Administered 2023-09-20: 10 g via ORAL
  Filled 2023-09-20: qty 1

## 2023-09-20 MED ORDER — HEPARIN SODIUM (PORCINE) 1000 UNIT/ML IJ SOLN
INTRAMUSCULAR | Status: DC | PRN
  Administered 2023-09-20: 10000 [IU] via INTRAVENOUS

## 2023-09-20 MED ORDER — HEPARIN (PORCINE) IN NACL 1000-0.9 UT/500ML-% IV SOLN
INTRAVENOUS | Status: DC | PRN
Start: 2023-09-20 — End: 2023-09-20
  Administered 2023-09-20: 1000 mL

## 2023-09-20 MED ORDER — OXYCODONE HCL 5 MG PO TABS
ORAL_TABLET | ORAL | Status: AC
Start: 2023-09-20 — End: 2023-09-20
  Filled 2023-09-20: qty 1

## 2023-09-20 MED ORDER — LIDOCAINE HCL (PF) 1 % IJ SOLN
INTRAMUSCULAR | Status: DC | PRN
  Administered 2023-09-20: 5 mL

## 2023-09-20 MED ORDER — HEPARIN SODIUM (PORCINE) 1000 UNIT/ML IJ SOLN
INTRAMUSCULAR | Status: AC
  Filled 2023-09-20: qty 10

## 2023-09-20 MED ORDER — ACETAMINOPHEN 650 MG RE SUPP: 650.0000 mg | Freq: Four times a day (QID) | RECTAL | Status: DC | PRN

## 2023-09-20 MED ORDER — SODIUM CHLORIDE 0.9 % IV SOLN
3.0000 g | INTRAVENOUS | Status: DC
  Administered 2023-09-20 – 2023-09-21 (×2): 3 g via INTRAVENOUS
  Filled 2023-09-20 (×2): qty 8

## 2023-09-20 MED ORDER — CHLORHEXIDINE GLUCONATE CLOTH 2 % EX PADS
6.0000 | MEDICATED_PAD | Freq: Every day | CUTANEOUS | Status: DC
  Administered 2023-09-21 – 2023-09-22 (×2): 6 via TOPICAL

## 2023-09-20 MED ORDER — ROSUVASTATIN CALCIUM 5 MG PO TABS
10.0000 mg | ORAL_TABLET | Freq: Every day | ORAL | Status: DC
  Administered 2023-09-21: 10 mg via ORAL
  Filled 2023-09-20: qty 2

## 2023-09-20 MED ORDER — CLOPIDOGREL BISULFATE 75 MG PO TABS
300.0000 mg | ORAL_TABLET | Freq: Once | ORAL | Status: AC
  Administered 2023-09-20: 300 mg via ORAL

## 2023-09-20 MED ORDER — SODIUM CHLORIDE 0.9 % WEIGHT BASED INFUSION: 1.0000 mL/kg/h | INTRAVENOUS | Status: DC

## 2023-09-20 MED ORDER — PROCHLORPERAZINE EDISYLATE 10 MG/2ML IJ SOLN: 5.0000 mg | Freq: Four times a day (QID) | INTRAMUSCULAR | Status: DC | PRN

## 2023-09-20 MED ORDER — LIDOCAINE HCL (PF) 1 % IJ SOLN
INTRAMUSCULAR | Status: AC
  Filled 2023-09-20: qty 30

## 2023-09-20 MED ORDER — SODIUM CHLORIDE 0.9% FLUSH
3.0000 mL | INTRAVENOUS | Status: DC | PRN
Start: 2023-09-21 — End: 2023-09-22

## 2023-09-20 MED ORDER — INSULIN ASPART 100 UNIT/ML IJ SOLN
0.0000 [IU] | INTRAMUSCULAR | Status: DC
  Administered 2023-09-20 – 2023-09-21 (×2): 1 [IU] via SUBCUTANEOUS
  Administered 2023-09-21: 2 [IU] via SUBCUTANEOUS
  Administered 2023-09-21 – 2023-09-22 (×2): 1 [IU] via SUBCUTANEOUS

## 2023-09-20 MED ORDER — PANTOPRAZOLE SODIUM 40 MG PO TBEC
40.0000 mg | DELAYED_RELEASE_TABLET | Freq: Every day | ORAL | Status: DC
  Administered 2023-09-21: 40 mg via ORAL
  Filled 2023-09-20: qty 1

## 2023-09-20 MED ORDER — SODIUM CHLORIDE 0.9 % IV SOLN: 250.0000 mL | INTRAVENOUS | Status: DC | PRN

## 2023-09-20 MED ORDER — ASPIRIN 81 MG PO TBEC
81.0000 mg | DELAYED_RELEASE_TABLET | Freq: Every day | ORAL | Status: DC
  Administered 2023-09-20: 81 mg via ORAL
  Filled 2023-09-20: qty 1

## 2023-09-20 MED ORDER — LABETALOL HCL 5 MG/ML IV SOLN: 10.0000 mg | INTRAVENOUS | Status: DC | PRN

## 2023-09-20 MED ORDER — SENNA 8.6 MG PO TABS: 1.0000 | ORAL_TABLET | Freq: Every day | ORAL | Status: DC | PRN

## 2023-09-20 MED ORDER — IODIXANOL 320 MG/ML IV SOLN
INTRAVENOUS | Status: DC | PRN
Start: 2023-09-20 — End: 2023-09-20
  Administered 2023-09-20: 50 mL

## 2023-09-20 MED ORDER — CLOPIDOGREL BISULFATE 75 MG PO TABS: 75.0000 mg | ORAL_TABLET | Freq: Every day | ORAL | Status: DC

## 2023-09-20 MED ORDER — SODIUM CHLORIDE 0.9% FLUSH
3.0000 mL | Freq: Two times a day (BID) | INTRAVENOUS | Status: DC
  Administered 2023-09-21 (×2): 3 mL via INTRAVENOUS

## 2023-09-20 MED ORDER — HYDRALAZINE HCL 20 MG/ML IJ SOLN: 5.0000 mg | INTRAMUSCULAR | Status: DC | PRN

## 2023-09-20 MED ORDER — SODIUM CHLORIDE 0.9% FLUSH: 3.0000 mL | INTRAVENOUS | Status: DC | PRN

## 2023-09-20 MED ORDER — SEVELAMER CARBONATE 800 MG PO TABS
1600.0000 mg | ORAL_TABLET | Freq: Three times a day (TID) | ORAL | Status: DC
  Administered 2023-09-20 – 2023-09-21 (×2): 1600 mg via ORAL
  Filled 2023-09-20 (×2): qty 2

## 2023-09-20 MED ORDER — CINACALCET HCL 30 MG PO TABS
60.0000 mg | ORAL_TABLET | ORAL | Status: DC
  Administered 2023-09-21: 60 mg via ORAL
  Filled 2023-09-20 (×3): qty 2

## 2023-09-20 MED ORDER — SODIUM CHLORIDE 0.9% FLUSH: 3.0000 mL | Freq: Two times a day (BID) | INTRAVENOUS | Status: DC

## 2023-09-20 MED ORDER — LINEZOLID 600 MG PO TABS
600.0000 mg | ORAL_TABLET | Freq: Two times a day (BID) | ORAL | Status: DC
  Administered 2023-09-21 (×2): 600 mg via ORAL
  Filled 2023-09-20 (×5): qty 1

## 2023-09-20 NOTE — Op Note (Signed)
 DATE OF SERVICE: 09/20/2023  PATIENT:  Edgar Brewer  63 y.o. male  PRE-OPERATIVE DIAGNOSIS:  Atherosclerosis of native arteries of left lower extremity causing gangrene  POST-OPERATIVE DIAGNOSIS:  Same  PROCEDURE:   1) Ultrasound guided right common femoral access (CPT [phone]) 2) Aortogram (CPT [phone]) 3) Left lower extremity angiogram with second order cannulation (CPT [phone]) 4) Additional left lower extremity angiogram with third order cannulation (CPT [phone]) 5) Left posterior tibial angioplasty - 3x220 [place] (CPT [phone])  SURGEON:  Debby SAILOR. Magda, MD  ASSISTANT: none  ANESTHESIA:   local  ESTIMATED BLOOD LOSS: min  LOCAL MEDICATIONS USED:  LIDOCAINE    COUNTS: confirmed correct.  PATIENT DISPOSITION:  PACU - hemodynamically stable.   Delay start of Pharmacological VTE agent (>24hrs) due to surgical blood loss or risk of bleeding: no  INDICATION FOR PROCEDURE: Edgar Brewer is a 63 y.o. male with left toe gangrene and non-invasive evidence of peripheral arterial disease. After careful discussion of risks, benefits, and alternatives the patient was offered angiogram. The patient understood and wished to proceed.  OPERATIVE FINDINGS:   Left renal artery: not seen Right renal artery: not seen  Infrarenal aorta: patent  Left common iliac artery: patent Right common iliac artery: patent  Left internal iliac artery: patent Right internal iliac artery: patent  Left external iliac artery: patent Right external iliac artery: patent  Left common femoral artery: patent Right common femoral artery: not studied  Left profunda femoris artery: patent - ? AV fistula between femoral artery and femoral vein Right profunda femoris artery: not studied  Left superficial femoral artery: patent Right superficial femoral artery: not studied  Left popliteal artery: patent Right popliteal artery: not studied  Left anterior tibial artery: patent - dominant tibial. Continues into foot as DP.  Right  anterior tibial artery: not studied  Left tibioperoneal trunk: patent Right tibioperoneal trunk: not studied  Left peroneal artery: patent, but diminutive to the ankle Right peroneal artery: not studied  Left posterior tibial artery: severely diseased throughout its length, greatest stenosis 95%. Calcified. Fills plantar vessels Right posterior tibial artery: not studied  Left pedal circulation: severely disadvantaged. Right pedal circulation: not studied   GLASS score. FP 0. IP 0. Stage 1.  WIfI score. 2 / 0 / 1. Stage II.  DESCRIPTION OF PROCEDURE: After identification of the patient in the pre-operative holding area, the patient was transferred to the operating room. The patient was positioned supine on the operating room table. The groins was prepped and draped in standard fashion. A surgical pause was performed confirming correct patient, procedure, and operative location.  The right groin was anesthetized with subcutaneous injection of 1% lidocaine . Using ultrasound guidance, the right common femoral artery was accessed with micropuncture technique. The 70F micropuncture sheath was upsized to 69F.   A Benson wire was advanced into the distal aorta. Over the wire an omni flush catheter was advanced to the level of L2. Aortogram was performed - see above for details.   The left common iliac artery was selected with an omniflush catheter and glidewire guidewire. The wire was advanced into the common femoral artery. Over the wire the omni flush catheter was advanced into the external iliac artery. Selective angiography was performed - see above for details.   The decision was made to intervene. The patient was heparinized with 10,000 units of heparin . The 69F sheath was exchanged for a 69F x 90cm sheath. Selective angiography of the left lower extremity extremity performed prior to intervention.  The lesions were treated with: Angioplasty of the left posterior tibial artery.  Completion  angiography revealed:  Resolution of PT disease; persistence of severe pedal disease.  The sheath was left in place to be removed in the recovery area.  Upon completion of the case instrument and sharps counts were confirmed correct. The patient was transferred to the PACU in good condition. I was present for all portions of the procedure.  PLAN: ASA / Plavix  / Statin. Severe pedal disease. Optimized for toe amputation. High risk of needing more proximal amputation, but this cannot be further reduced by vascular intervention.  Debby SAILOR. Magda, MD [facility] Phone Number: [phone] 09/20/2023 10:15 AM

## 2023-09-20 NOTE — Progress Notes (Signed)
 Pt receives out-pt HD at [facility] on MWF 11:00 am chair time. Will assist as needed.   Randine Mungo Dialysis Navigator [phone]

## 2023-09-20 NOTE — Consult Note (Signed)
 PODIATRY CONSULTATION  NAME Edgar Brewer MRN [id number] DOB 1960-05-07 DOA 09/19/2023   Reason for consult:  Chief Complaint  Patient presents with   Toe Pain    Attending/Consulting physician: C. Gherghe MD  History of present illness: Edgar Brewer is a 63 y.o. male with medical history significant for diabetes mellitus, history of CVA, and ESRD on hemodialysis who presents for evaluation of left second toe discoloration and drainage.   Patient was first noted to have a left second toe wound while at dialysis approximately 3 weeks ago.  Since then, he has experienced progressive discoloration.  He reports being treated with oral antibiotics but has had continued worsening and is now experiencing some drainage from the toe.  He has been following with podiatry, Dr. Malvin, for this.  He was evaluated by his PCP today, toe appeared to be worsening and there was concern for infection, and he was sent to the ED.  He denies fevers, chills, chest pain, or abdominal pain.  He reports completing dialysis on 09/18/2023.  Pt known to me from visit 09/07/23 where he was diagnosed with L 2nd toe dry gangrene. Did not appear acutely infected at that time. Decision made for vascular referral. Had abi in feb 24 that should abnormal TBI and [place] vessels. We discussed plan for probable amputation later this week.   Past Medical History:  Diagnosis Date   Acute on chronic combined systolic and diastolic CHF, NYHA class 1 (HCC) 01/13/2015   Allergy    Arthritis    Chronic kidney disease    MWF [facility].   Diabetes mellitus type 2 with complications (HCC)    Previous insulin , non-insulin  requiring noted 09/2018.  Complications include retinopathy, peripheral neuropathy.   GERD (gastroesophageal reflux disease)    at times   Head injury with loss of consciousness [facility])    History of kidney stones    Hypertension    Neuromuscular disorder (HCC)    Neuropathy    Pulmonary hypertension (HCC)    PA  peak pressure 41 mmHg 03/04/16 echo   Stroke [facility])    no residual weakness   Varicose veins of lower extremities with complications 12/24/2014       Latest Ref Rng & Units 09/20/2023    5:09 AM 09/19/2023    4:12 PM 08/29/2023    3:03 PM  CBC  WBC 4.0 - 10.5 K/uL 7.5  7.6  7.2   Hemoglobin 13.0 - 17.0 g/dL 88.4  87.5  87.7   Hematocrit 39.0 - 52.0 % 34.4  37.5  36.5   Platelets 150 - 400 K/uL 199  230  188        Latest Ref Rng & Units 09/20/2023    5:09 AM 09/19/2023    4:12 PM 08/29/2023    3:03 PM  BMP  Glucose 70 - 99 mg/dL 838  724  819   BUN 8 - 23 mg/dL 41  35  34   Creatinine 0.61 - 1.24 mg/dL 88.49  89.32  0.21   Sodium 135 - 145 mmol/L 135  129  137   Potassium 3.5 - 5.1 mmol/L 5.6  5.7  5.6   Chloride 98 - 111 mmol/L 98  91  93   CO2 22 - 32 mmol/L 23  23  29    Calcium  8.9 - 10.3 mg/dL 9.9  9.8  88.9       Physical Exam: Lower Extremity Exam  Left 2nd toe with necrotic tissue circumfirentially to level  of PIPJ, increased from prior,  Erythema and maceration plantar and dorsal to the MPJ level       Non palpable dp and pt pulse left foot, does have + doppler signal DP>PT   Sensation intact to light touch   ASSESSMENT/PLAN OF CARE 63 y.o. male with PMHx significant for  diabetes mellitus, history of CVA, and ESRD on hemodialysis  with gangrene and cellulitis of the left 2nd toe, progression from dry to wet gangrene.   WBC 7.5 XR foot L: No fracture or dislocation is noted. No definite bony abnormality is noted.  - Appreciate vascular surgery, plan for angiogram LLE today. - Pt will require L 2nd toe amputation once revascularized, possibly tomorrow afternoon vs Friday AM. He is in agreement with plan - Continue IV abx broad spectrum pending further culture data - Anticoagulation: ok to continue per vascular/ primary recs - Wound care: betadine pain to L 2nd toe pre op - WB status: WBAT LLE - Will continue to follow   Thank you for the consult.  Please  contact me directly with any questions or concerns.           [name], DPM [facility]    [address] [place], [place] [address]                Office [phone]  Fax [phone]

## 2023-09-20 NOTE — Progress Notes (Signed)
 PROGRESS NOTE  Edgar Brewer FMW:[id number] DOB: 01/07/1961 DOA: 09/19/2023 PCP: Katrinka Garnette KIDD, MD   LOS: 1 day   Brief Narrative / Interim history:  63 y.o. male with medical history significant for diabetes mellitus, history of CVA, and ESRD on hemodialysis who presents for evaluation of left second toe discoloration and drainage.  Vascular and podiatry consulted  Subjective / 24h Interval events: Doing well this morning, about to go down for vascular  Assesement and Plan: Principal Problem:   Gangrene of toe of left foot (HCC) Active Problems:   ESRD (end stage renal disease) on dialysis (HCC)   Type 2 diabetes mellitus with diabetic neuropathy, unspecified (HCC)   History of stroke   Hyperkalemia   Hyponatremia   Principal problem Left second toe gangrene -vascular surgery consulted, he will have arteriogram this morning.  Podiatry consulted as well, likely he will need amputation - Continue antibiotics from  Active problems ESRD, hyperkalemia-nephrology consulted this morning.  He is due for dialysis today  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  Scheduled Meds:  [MAR Hold] aspirin  EC  81 mg Oral Daily   [MAR Hold] cinacalcet   60 mg Oral Q T,Th,Sat-1800   [MAR Hold] gabapentin   100 mg Oral TID   [MAR Hold] insulin  aspart  0-6 Units Subcutaneous Q4H   [MAR Hold] insulin  glargine-yfgn  20 Units Subcutaneous Daily   [MAR Hold] linezolid   600 mg Oral Q12H   [MAR Hold] pantoprazole   40 mg Oral Daily   [MAR Hold] rosuvastatin   10 mg Oral Daily   [MAR Hold] sevelamer  carbonate  1,600 mg Oral TID WC   [MAR Hold] sodium chloride  flush  3 mL Intravenous Q12H   sodium chloride  flush  3 mL Intravenous Q12H   Continuous Infusions:  sodium chloride      [MAR Hold] ampicillin -sulbactam (UNASYN ) IV 3 g (09/20/23 0350)   PRN Meds:.sodium chloride , [MAR Hold] acetaminophen  **OR** [MAR Hold] acetaminophen , Heparin   (Porcine) in NaCl, heparin  sodium (porcine), iodixanol , lidocaine  (PF), [MAR Hold] oxyCODONE , [MAR Hold] prochlorperazine , [MAR Hold] senna, sodium chloride  flush  Current Outpatient Medications  Medication Instructions   aspirin  EC 81 mg, Daily   b complex-vitamin c -folic acid  (NEPHRO-VITE) 0.8 MG TABS tablet 1 tablet, Every morning   BD PEN NEEDLE SHORT ULTRAFINE 31G X 8 MM MISC USE TO INJECT INSULIN  INTO THE SKIN DAILY.   cinacalcet  (SENSIPAR ) 60 mg, Every T-Th-Sa (1800)   gabapentin  (NEURONTIN ) 100 mg, Oral, 3 times daily   insulin  glargine-yfgn (SEMGLEE ) 100 UNIT/ML Pen Inject into the skin.   omeprazole  (PRILOSEC) 20 mg, Oral, Daily   rosuvastatin  (CRESTOR ) 10 mg, Oral, Daily   sevelamer  carbonate (RENVELA ) 1,600 mg, Oral, 3 times daily with meals    Diet Orders (From admission, onward)     Start     Ordered   09/21/23 0001  Diet NPO time specified Except for: Sips with Meds  Diet effective midnight       Question:  Except for  Answer:  Sips with Meds   09/20/23 0857   09/20/23 0129  Diet NPO time specified Except for: Ice Chips, Sips with Meds  Diet effective midnight       Question Answer Comment  Except for Ice Chips   Except for Sips with Meds      09/20/23 0131            DVT prophylaxis: SCDs Start: 09/20/23 0131   Lab Results  Component  Value Date   PLT 199 09/20/2023      Code Status: Full Code  Family Communication: No family at bedside  Status is: Inpatient Remains inpatient appropriate because: severity of illness  Level of care: Telemetry Surgical  Consultants:  Vascular surgery Podiatry Nephrology  Objective: Vitals:   09/20/23 0130 09/20/23 0401 09/20/23 0755 09/20/23 0905  BP:  (!) 142/39 124/63   Pulse:  86 77   Resp:  16    Temp:  98 F (36.7 C) 98.2 F (36.8 C)   TempSrc:   Oral   SpO2: 96% 96% 97% 100%  Weight:      Height:       No intake or output data in the 24 hours ending 09/20/23 1004 Wt Readings from Last 3  Encounters:  09/19/23 98.9 kg  09/19/23 99.2 kg  08/29/23 100.4 kg    Examination:  Constitutional: NAD Eyes: no scleral icterus ENMT: Mucous membranes are moist.  Neck: normal, supple Respiratory: clear to auscultation bilaterally, no wheezing, no crackles. Normal respiratory effort. No accessory muscle use.  Cardiovascular: Regular rate and rhythm, no murmurs / rubs / gallops. No LE edema.  Abdomen: non distended, no tenderness. Bowel sounds positive.  Musculoskeletal: no clubbing / cyanosis.    Data Reviewed: I have independently reviewed following labs and imaging studies   CBC Recent Labs  Lab 09/19/23 1612 09/20/23 0509  WBC 7.6 7.5  HGB 12.4* 11.5*  HCT 37.5* 34.4*  PLT 230 199  MCV 96.9 95.6  MCH 32.0 31.9  MCHC 33.1 33.4  RDW 13.7 13.8  LYMPHSABS 0.9  --   MONOABS 0.6  --   EOSABS 0.1  --   BASOSABS 0.1  --     Recent Labs  Lab 09/19/23 1612 09/19/23 1618 09/20/23 0509  NA 129*  --  135  K 5.7*  --  5.6*  CL 91*  --  98  CO2 23  --  23  GLUCOSE 275*  --  161*  BUN 35*  --  41*  CREATININE 10.67*  --  11.50*  CALCIUM  9.8  --  9.9  AST 23  --   --   ALT 24  --   --   ALKPHOS 298*  --   --   BILITOT 0.6  --   --   ALBUMIN 3.5  --   --   LATICACIDVEN  --  1.2  --     ------------------------------------------------------------------------------------------------------------------ No results for input(s): CHOL, HDL, LDLCALC, TRIG, CHOLHDL, LDLDIRECT in the last 72 hours.  Lab Results  Component Value Date   HGBA1C 7.0 (A) 06/15/2023   ------------------------------------------------------------------------------------------------------------------ No results for input(s): TSH, T4TOTAL, T3FREE, THYROIDAB in the last 72 hours.  Invalid input(s): FREET3  Cardiac Enzymes No results for input(s): CKMB, TROPONINI, MYOGLOBIN in the last 168 hours.  Invalid input(s):  CK ------------------------------------------------------------------------------------------------------------------    Component Value Date/Time   BNP 929.7 (H) 05/20/2020 0411    CBG: Recent Labs  Lab 09/20/23 0347  GLUCAP 174*    Recent Results (from the past 240 hours)  Blood Cultures x 2 sites     Status: None (Preliminary result)   Collection Time: 09/19/23  4:09 PM   Specimen: BLOOD  Result Value Ref Range Status   Specimen Description BLOOD BLOOD RIGHT FOREARM  Final   Special Requests   Final    BOTTLES DRAWN AEROBIC AND ANAEROBIC Blood Culture adequate volume   Culture   Final    NO GROWTH <  24 HOURS Performed at [facility], [address] [place], [place] [address]    Report Status PENDING  Incomplete  Blood Cultures x 2 sites     Status: None (Preliminary result)   Collection Time: 09/19/23  4:12 PM   Specimen: BLOOD  Result Value Ref Range Status   Specimen Description BLOOD SITE NOT SPECIFIED  Final   Special Requests   Final    BOTTLES DRAWN AEROBIC ONLY Blood Culture results may not be optimal due to an inadequate volume of blood received in culture bottles   Culture   Final    NO GROWTH < 24 HOURS Performed at [facility], [address], [place], [place] [address]    Report Status PENDING  Incomplete     Radiology Studies: DG Foot Complete Left Result Date: 09/19/2023 CLINICAL DATA:  Left foot ulcer.  Possible gangrene. EXAM: LEFT FOOT - COMPLETE 3+ VIEW COMPARISON:  None Available. FINDINGS: There is no evidence of fracture or dislocation. There is no evidence of arthropathy or other focal bone abnormality. Vascular calcifications are noted. IMPRESSION: No fracture or dislocation is noted. No definite bony abnormality is noted. Electronically Signed   By: Lynwood Landy Raddle M.D.   On: 09/19/2023 12:48     Nilda Fendt, MD, PhD [facility]  Between 7 am - 7 pm I am available, please contact me via Amion (for emergencies) or  Securechat (non urgent messages)  Between 7 pm - 7 am I am not available, please contact night coverage MD/APP via Amion

## 2023-09-20 NOTE — Consult Note (Signed)
 [facility] and [facility]in [facility]ist of [facility]  Patient name: Edgar Brewer MRN: [id number] DOB: 1960-02-15 Sex: male   REQUESTING PROVIDER:   ER   REASON FOR CONSULT:    Left third toe  HISTORY OF PRESENT ILLNESS:   Jakim Drapeau is a 63 y.o. male, who presented to the ER yesterday with left third toe.  He states that this was noticed about 3 weeks ago and has progressively become more dark.  He tried outpatient antibiotics.  He was seen by his primary care provider and was sent to the ER.  Dr. Malvin with podiatry has seen him as well.  He had ABIs in our office that were noncompressible with biphasic waveforms.  Left toe pressure was 107   The patient has undergone left-sided access for dialysis in our office.  He has also had ablation of his right saphenous vein.  We have also seen him for PAD, the most recent time in 2020 for he has not had any classic signs or symptoms of claudication.  The patient suffers from end-stage renal disease on dialysis Monday Wednesday Friday.  He has type 2 diabetes.  He has a history of stroke with no residual weakness he is a former smoker.  He takes a statin for hypercholesterolemia.  PAST MEDICAL HISTORY    Past Medical History:  Diagnosis Date   Acute on chronic combined systolic and diastolic CHF, NYHA class 1 (HCC) 01/13/2015   Allergy    Arthritis    Chronic kidney disease    MWF [facility].   Diabetes mellitus type 2 with complications (HCC)    Previous insulin , non-insulin  requiring noted 09/2018.  Complications include retinopathy, peripheral neuropathy.   GERD (gastroesophageal reflux disease)    at times   Head injury with loss of consciousness (HCC)    History of kidney stones    Hypertension    Neuromuscular disorder (HCC)    Neuropathy    Pulmonary hypertension (HCC)    PA peak pressure 41 mmHg 03/04/16 echo   Stroke [facility])    no residual weakness   Varicose veins of lower extremities with  complications 12/24/2014     FAMILY HISTORY   Family History  Problem Relation Age of Onset   Arthritis Mother    Hearing loss Father    Prostate cancer Father    Hyperlipidemia Maternal Grandmother    Heart disease Maternal Grandmother    Lung cancer Maternal Grandfather     SOCIAL HISTORY:   Social History   Socioeconomic History   Marital status: Single    Spouse name: Not on file   Number of children: Not on file   Years of education: Not on file   Highest education level: Not on file  Occupational History   Not on file  Tobacco Use   Smoking status: Former    Current packs/day: 0.00    Average packs/day: 1 pack/day for 45.0 years (45.0 ttl pk-yrs)    Types: Cigarettes    Start date: 01/12/1970    Quit date: 01/13/2015    Years since quitting: 8.6   Smokeless tobacco: Never  Vaping Use   Vaping status: Never Used  Substance and Sexual Activity   Alcohol use: Yes    Alcohol/week: 1.0 standard drink of alcohol    Types: 1 Glasses of wine per week    Comment: rare occassion   Drug use: No   Sexual activity: Not Currently  Other Topics Concern   Not on file  Social  History Narrative   Separated for many years over 15 years/practical divorce. No Kids. No pets.  Lives with mom [name]      Disabled- from dialysis and diabetes. 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      Hobbies: play cards, play poker, bet on horses   Social Drivers of Health   Financial Resource Strain: Low Risk  (04/19/2022)   Overall Financial Resource Strain (CARDIA)    Difficulty of Paying Living Expenses: Not hard at all  Food Insecurity: No Food Insecurity (09/20/2023)   Hunger Vital Sign    Worried About Running Out of Food in the Last Year: Never true    Ran Out of Food in the Last Year: Never true  Transportation Needs: No Transportation Needs (09/20/2023)   PRAPARE - Administrator, Civil Service (Medical): No    Lack of Transportation  (Non-Medical): No  Physical Activity: Inactive (04/19/2022)   Exercise Vital Sign    Days of Exercise per Week: 0 days    Minutes of Exercise per Session: 0 min  Stress: No Stress Concern Present (04/19/2022)   [employer] of Occupational Health - Occupational Stress Questionnaire    Feeling of Stress : Not at all  Social Connections: Unknown (04/19/2022)   Social Connection and Isolation Panel    Frequency of Communication with Friends and Family: Not on file    Frequency of Social Gatherings with Friends and Family: Twice a week    Attends Religious Services: More than 4 times per year    Active Member of [employer] or Organizations: No    Attends Banker Meetings: Never    Marital Status: Separated  Intimate Partner Violence: Not At Risk (09/20/2023)   Humiliation, Afraid, Rape, and Kick questionnaire    Fear of Current or Ex-Partner: No    Emotionally Abused: No    Physically Abused: No    Sexually Abused: No    ALLERGIES:    Allergies  Allergen Reactions   Codeine Anaphylaxis, Hives, Swelling and Other (See Comments)    Swelling all over body and the throat   Clindamycin /Lincomycin Nausea And Vomiting    CURRENT MEDICATIONS:    Current Facility-Administered Medications  Medication Dose Route Frequency Provider Last Rate Last Admin   acetaminophen  (TYLENOL ) tablet 650 mg  650 mg Oral Q6H PRN Opyd, Timothy S, MD       Or   acetaminophen  (TYLENOL ) suppository 650 mg  650 mg Rectal Q6H PRN Opyd, Timothy S, MD       Ampicillin -Sulbactam (UNASYN ) 3 g in sodium chloride  0.9 % 100 mL IVPB  3 g Intravenous Q24H Gretta Levorn SQUIBB, RPH 200 mL/hr at 09/20/23 0350 3 g at 09/20/23 0350   aspirin  EC tablet 81 mg  81 mg Oral Daily Opyd, Timothy S, MD       [START ON 09/21/2023] cinacalcet  (SENSIPAR ) tablet 60 mg  60 mg Oral Q T,Th,Sat-1800 Opyd, Timothy S, MD       gabapentin  (NEURONTIN ) capsule 100 mg  100 mg Oral TID Opyd, Evalene RAMAN, MD       insulin  aspart (novoLOG )  injection 0-6 Units  0-6 Units Subcutaneous Q4H Opyd, Evalene RAMAN, MD   1 Units at 09/20/23 0354   insulin  glargine-yfgn (SEMGLEE ) injection 20 Units  20 Units Subcutaneous Daily Opyd, Timothy S, MD       linezolid  (ZYVOX ) tablet 600 mg  600 mg Oral Q12H Opyd, Evalene RAMAN, MD  oxyCODONE  (Oxy IR/ROXICODONE ) immediate release tablet 5 mg  5 mg Oral Q6H PRN Opyd, Evalene RAMAN, MD       pantoprazole  (PROTONIX ) EC tablet 40 mg  40 mg Oral Daily Opyd, Evalene RAMAN, MD       prochlorperazine  (COMPAZINE ) injection 5 mg  5 mg Intravenous Q6H PRN Opyd, Evalene RAMAN, MD       rosuvastatin  (CRESTOR ) tablet 10 mg  10 mg Oral Daily Opyd, Evalene RAMAN, MD       senna (SENOKOT) tablet 8.6 mg  1 tablet Oral Daily PRN Opyd, Evalene RAMAN, MD       sevelamer  carbonate (RENVELA ) tablet 1,600 mg  1,600 mg Oral TID WC Opyd, Evalene RAMAN, MD       sodium chloride  flush (NS) 0.9 % injection 3 mL  3 mL Intravenous Q12H Opyd, Evalene RAMAN, MD        REVIEW OF SYSTEMS:   [X]  denotes positive finding, [ ]  denotes negative finding Cardiac  Comments:  Chest pain or chest pressure:    Shortness of breath upon exertion:    Short of breath when lying flat:    Irregular heart rhythm:        Vascular    Pain in calf, thigh, or hip brought on by ambulation:    Pain in feet at night that wakes you up from your sleep:     Blood clot in your veins:    Leg swelling:         Pulmonary    Oxygen at home:    Productive cough:     Wheezing:         Neurologic    Sudden weakness in arms or legs:     Sudden numbness in arms or legs:     Sudden onset of difficulty speaking or slurred speech:    Temporary loss of vision in one eye:     Problems with dizziness:         Gastrointestinal    Blood in stool:      Vomited blood:         Genitourinary    Burning when urinating:     Blood in urine:        Psychiatric    Major depression:         Hematologic    Bleeding problems:    Problems with blood clotting too easily:        Skin     Rashes or ulcers:        Constitutional    Fever or chills:     PHYSICAL EXAM:   Vitals:   09/19/23 2237 09/20/23 0031 09/20/23 0130 09/20/23 0401  BP: (!) 145/59 122/63  (!) 142/39  Pulse: 80 86  86  Resp: 16 17  16   Temp: 97.6 F (36.4 C) (!) 96.8 F (36 C)  98 F (36.7 C)  TempSrc:      SpO2: 99% 96% 96% 96%  Weight:      Height:        GENERAL: The patient is a well-nourished male, in no acute distress. The vital signs are documented above. CARDIAC: There is a regular rate and rhythm.  VASCULAR: Palpable femoral pulses.  Nonpalpable pedal pulses PULMONARY: Nonlabored respirations ABDOMEN: Soft and non-tender  MUSCULOSKELETAL: There are no major deformities or cyanosis. NEUROLOGIC: No focal weakness or paresthesias are detected. SKIN: See photo below. PSYCHIATRIC: The patient has a normal affect.     STUDIES:   I  have reviewed the following: ABI Findings:  +---------+------------------+-----+--------+----------------+  Right   Rt Pressure (mmHg)IndexWaveformComment           +---------+------------------+-----+--------+----------------+  Brachial 139                                              +---------+------------------+-----+--------+----------------+  PTA                            biphasicnon-compressible  +---------+------------------+-----+--------+----------------+  DP                             biphasicnon-compressible  +---------+------------------+-----+--------+----------------+  [place] Toe113               0.81                           +---------+------------------+-----+--------+----------------+   +---------+------------------+-----+--------+----------------+  Left    Lt Pressure (mmHg)IndexWaveformComment           +---------+------------------+-----+--------+----------------+  PTA                            biphasicnon-compressible  +---------+------------------+-----+--------+----------------+   DP                             biphasicnon-compressible  +---------+------------------+-----+--------+----------------+  Great Toe107               0.77                           +---------+------------------+-----+--------+----------------+   ASSESSMENT and PLAN   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  He is at risk for limb loss without revascularization.  I discussed proceeding with angiography later today to see what his options are for revascularization.  Hopefully he can be treated from a endovascular standpoint.  This will be from a right femoral approach   Malvina New, IV, MD, FACS [facility] [phone] Pager [phone]

## 2023-09-20 NOTE — Progress Notes (Signed)
 Initial Nutrition Assessment  DOCUMENTATION CODES:   Obesity unspecified  INTERVENTION:  When pt diet ordered after surgery, recommend:  Liberalizing diet as much as possible to provide increased options for pt (Renal no Carb Mod) Adding daily Rena Vit supplementation (Nephro Vit part of pt's home meds) Magic cup TID with meals, each supplement provides 290 kcal and 9 grams of protein Nepro Shake po BID, each supplement provides 425 kcal and 19 grams protein  1 packet Juven BID, each packet provides 95 calories, 2.5 grams of protein (collagen), and 9.8 grams of carbohydrate (3 grams sugar); also contains 7 grams of L-arginine and L-glutamine, 300 mg vitamin C , 15 mg vitamin E, 1.2 mcg vitamin B-12, 9.5 mg zinc, 200 mg calcium , and 1.5 g  Calcium  Beta-hydroxy-Beta-methylbutyrate to support wound healing   NUTRITION DIAGNOSIS:   Increased nutrient needs related to wound healing as evidenced by estimated needs.  GOAL:   Patient will meet greater than or equal to 90% of their needs  MONITOR:   PO intake, Supplement acceptance  REASON FOR ASSESSMENT:   Consult Wound healing  ASSESSMENT:   Pt with hx ESRD on dialysis MWF, type II diabetes with neuropathy, CHF, CVA, and GERD. Admitted for worsening discoloration and drainage of L second toe gangrene.  Plan for angiography today 8/27 to assess revascularization. Pt unavailable at time of assessment, in cath lab for angiogram.   Per chart review, pt currently receiving dialysis treatments MWF. Pt was currently taking NephroVit daily for vitamin supplementation, recommend ordering RenaVit daily while admitted. Pt will require protein supplementation while recovering from surgery, recommend liberalizing diet as much as possible to provide increased options as well as supplements like magic cups and Nepro. Will recommend Juven BID to help aid in wound healing due to possible deficiencies. Wt hx shows no significant changes in last  year.  Per [facility]: EDW- 97.4 kg Last HD wt values: 09/19/23 Pre-dialysis 101.3 Post-dialysis 97.9 kg   Medications reviewed and include:  SSI Novolog  q 4 hr Semglee  20 units daily Protonix  Renvela  Lokelma  Unasyn   Labs reviewed:  Potassium 5.6 (high) BUN 41/ Creatinine 11. 50 (high) A1c 7.0 CBG x 24 hr: 174 mg/dL  NUTRITION - FOCUSED PHYSICAL EXAM: Deferred to follow up  Diet Order:   Diet Order             Diet renal/carb modified with fluid restriction Diet-HS Snack? Nothing; Fluid restriction: 1200 mL Fluid; Room service appropriate? Yes; Fluid consistency: Thin  Diet effective now                   EDUCATION NEEDS:   Not appropriate for education at this time  Skin:  Skin Assessment: Reviewed RN Assessment  Last BM:  8/26  Height:   Ht Readings from Last 1 Encounters:  09/19/23 5' 8 (1.727 m)    Weight:   Wt Readings from Last 1 Encounters:  09/19/23 98.9 kg    Ideal Body Weight:  70 kg  BMI:  Body mass index is 33.15 kg/m.  Estimated Nutritional Needs:   Kcal:  2000-2200  Protein:  85-105g  Fluid:  >/= 2L    Josette Glance, MS, RDN, LDN Clinical Dietitian I Please reach out via secure chat

## 2023-09-20 NOTE — Plan of Care (Signed)
Pt is at dialysis

## 2023-09-20 NOTE — H&P (Signed)
 History and Physical    Milferd Ansell FMW:[id number] DOB: Jun 14, 1960 DOA: 09/19/2023  PCP: Katrinka Garnette KIDD, MD   Patient coming from: Home   Chief Complaint: rotting toe    HPI: Edgar Brewer is a 63 y.o. male with medical history significant for diabetes mellitus, history of CVA, and ESRD on hemodialysis who presents for evaluation of left second toe discoloration and drainage.  Patient was first noted to have a left second toe wound while at dialysis approximately 3 weeks ago.  Since then, he has experienced progressive discoloration.  He reports being treated with oral antibiotics but has had continued worsening and is now experiencing some drainage from the toe.  He has been following with podiatry, Dr. Malvin, for this.  He was evaluated by his PCP today, toe appeared to be worsening and there was concern for infection, and he was sent to the ED.  He denies fevers, chills, chest pain, or abdominal pain.  He reports completing dialysis on 09/18/2023.  ED Course: Upon arrival to the ED, patient is found to be afebrile and saturating mid 90s on room air with normal HR and stable BP.  Labs are most notable for sodium 129, potassium 5.7, BUN 35, glucose 275, normal WBC, and normal lactate.  Plain radiographs of the left foot are negative for acute osseous abnormality.  Vascular surgery (Dr. Serene) was consulted by the ED PA, blood cultures were collected, and the patient was treated with vancomycin  and cefepime .  Review of Systems:  All other systems reviewed and apart from HPI, are negative.  Past Medical History:  Diagnosis Date   Acute on chronic combined systolic and diastolic CHF, NYHA class 1 (HCC) 01/13/2015   Allergy    Arthritis    Chronic kidney disease    MWF [facility].   Diabetes mellitus type 2 with complications (HCC)    Previous insulin , non-insulin  requiring noted 09/2018.  Complications include retinopathy, peripheral neuropathy.   GERD (gastroesophageal reflux  disease)    at times   Head injury with loss of consciousness (HCC)    History of kidney stones    Hypertension    Neuromuscular disorder (HCC)    Neuropathy    Pulmonary hypertension (HCC)    PA peak pressure 41 mmHg 03/04/16 echo   Stroke [facility])    no residual weakness   Varicose veins of lower extremities with complications 12/24/2014    Past Surgical History:  Procedure Laterality Date   AV FISTULA PLACEMENT Left 06/01/2016   Procedure: ARTERIOVENOUS (AV) FISTULA CREATION;  Surgeon: Oris Krystal FALCON, MD;  Location: [facility] OR;  Service: Vascular;  Laterality: Left;   AV FISTULA PLACEMENT Left 08/10/2017   Procedure: BRACHIOCEPHALIC ARTERIOVENOUS (AV) FISTULA CREATION LEFT UPPER EXTREMITY;  Surgeon: Eliza Lonni RAMAN, MD;  Location: [facility] OR;  Service: Vascular;  Laterality: Left;   CHOLECYSTECTOMY N/A 10/04/2018   Procedure: LAPAROSCOPIC CHOLECYSTECTOMY;  Surgeon: Stevie Herlene Righter, MD;  Location: [facility] OR;  Service: General;  Laterality: N/A;   COLONOSCOPY W/ POLYPECTOMY  03/2016   For evaluation of iron deficiency anemia.  Dr. Shila.  10 polyps, largest 20 mm.  Pathology: tubular adenomas with no high-grade dysplasia, hyperplastic.  None bleeding internal hemorrhoids.  Pandiverticulosis.   CYSTOSCOPY WITH RETROGRADE PYELOGRAM, URETEROSCOPY AND STENT PLACEMENT Bilateral 02/25/2021   Procedure: CYSTOSCOPY WITH BILATERAL  RETROGRADE PYELOGRAM, RIGHT DIAGNOSTIC URETEROSCOPY,  BLADDER BIOPSY WITH FULGERATION;  Surgeon: Alvaro Hummer, MD;  Location: [facility] ORS;  Service: Urology;  Laterality: Bilateral;   ENDOVENOUS ABLATION SAPHENOUS  VEIN W/ LASER Left 07-23-2015   endovenous laser ablation left greater saphenous vein by Krystal Doing MD   ENDOVENOUS ABLATION SAPHENOUS VEIN W/ LASER Right 10/15/2015   EVLA right greater saphenous vein by Krystal Doing MD   ESOPHAGOGASTRODUODENOSCOPY  03/2016   For evaluation of IDA.  Dr. Shila.  Duodenal erythema, pathology benign mucosa, no villous atrophy.    GALLBLADDER SURGERY  10/2018   HOLMIUM LASER APPLICATION Right 02/25/2021   Procedure: HOLMIUM LASER OF TUMOR;  Surgeon: Alvaro Hummer, MD;  Location: [facility] ORS;  Service: Urology;  Laterality: Right;   IR RADIOLOGIST EVAL & MGMT  11/29/2018   TEE WITHOUT CARDIOVERSION N/A 11/19/2018   Procedure: TRANSESOPHAGEAL ECHOCARDIOGRAM (TEE);  Surgeon: Alveta Aleene PARAS, MD;  Location: [facility] ENDOSCOPY;  Service: Cardiovascular;  Laterality: N/A;    Social History:   reports that he quit smoking about 8 years ago. His smoking use included cigarettes. He started smoking about 53 years ago. He has a 45 pack-year smoking history. He has never used smokeless tobacco. He reports current alcohol use of about 1.0 standard drink of alcohol per week. He reports that he does not use drugs.  Allergies  Allergen Reactions   Codeine Anaphylaxis, Hives, Swelling and Other (See Comments)    Swelling all over body and the throat   Clindamycin /Lincomycin Nausea And Vomiting    Family History  Problem Relation Age of Onset   Arthritis Mother    Hearing loss Father    Prostate cancer Father    Hyperlipidemia Maternal Grandmother    Heart disease Maternal Grandmother    Lung cancer Maternal Grandfather      Prior to Admission medications   Medication Sig Start Date End Date Taking? Authorizing Provider  aspirin  EC 81 MG tablet Take 81 mg by mouth daily. Swallow whole.   Yes [provider]  b complex-vitamin c -folic acid  (NEPHRO-VITE) 0.8 MG TABS tablet Take 1 tablet by mouth in the morning.   Yes [provider]  cinacalcet  (SENSIPAR ) 60 MG tablet Take 60 mg by mouth every Tuesday, Thursday, and Saturday at 6 PM. In the evening.   Yes [provider]  gabapentin  (NEURONTIN ) 100 MG capsule TAKE 1 CAPSULE BY MOUTH THREE TIMES DAILY 07/13/23  Yes Katrinka Garnette KIDD, MD  insulin  glargine-yfgn (SEMGLEE ) 100 UNIT/ML Pen Inject into the skin. Patient taking differently: Inject 50 Units into the skin  every morning. 08/01/23  Yes [provider]  omeprazole  (PRILOSEC) 20 MG capsule Take 1 capsule by mouth once daily Patient taking differently: Take 20 mg by mouth every evening. 07/13/23  Yes Katrinka Garnette KIDD, MD  rosuvastatin  (CRESTOR ) 10 MG tablet Take 1 tablet by mouth once daily 08/07/23  Yes Katrinka Garnette KIDD, MD  sevelamer  carbonate (RENVELA ) 800 MG tablet Take 2 tablets (1,600 mg total) by mouth 3 (three) times daily with meals. 10/22/18  Yes Cheryle Page, MD  BD PEN NEEDLE SHORT ULTRAFINE 31G X 8 MM MISC USE TO INJECT INSULIN  INTO THE SKIN DAILY. 07/31/23   Katrinka Garnette KIDD, MD    Physical Exam: Vitals:   09/19/23 2130 09/19/23 2200 09/19/23 2237 09/20/23 0031  BP: 131/62 121/64 (!) 145/59 122/63  Pulse: 81 79 80 86  Resp: (!) 22 (!) 22 16 17   Temp:   97.6 F (36.4 C) (!) 96.8 F (36 C)  TempSrc:      SpO2: 96% 95% 99% 96%  Weight:      Height:        Constitutional:  NAD, calm  Eyes: PERTLA, lids and conjunctivae normal ENMT: Mucous membranes are moist. Posterior pharynx clear of any exudate or lesions.   Neck: supple, no masses  Respiratory: no wheezing, no crackles. No accessory muscle use.  Cardiovascular: S1 & S2 heard, regular rate and rhythm. Trace extremity edema.   Abdomen: No tenderness, soft. Bowel sounds active.  Musculoskeletal: no clubbing / cyanosis. No joint deformity upper and lower extremities.   Skin: left second toe dark and atrophic with scant drainage and surrounding erythema and heat. Skin is otherwise warm, dry, well-perfused. Neurologic: CN 2-12 grossly intact. Moving all extremities. Alert and oriented.  Psychiatric: Pleasant. Cooperative.    Labs and Imaging on Admission: I have personally reviewed following labs and imaging studies  CBC: Recent Labs  Lab 09/19/23 1612  WBC 7.6  NEUTROABS 6.0  HGB 12.4*  HCT 37.5*  MCV 96.9  PLT 230   Basic Metabolic Panel: Recent Labs  Lab 09/19/23 1612  NA 129*  K 5.7*  CL 91*  CO2 23   GLUCOSE 275*  BUN 35*  CREATININE 10.67*  CALCIUM  9.8   GFR: Estimated Creatinine Clearance: 8 mL/min (A) (by C-G formula based on SCr of 10.67 mg/dL (H)). Liver Function Tests: Recent Labs  Lab 09/19/23 1612  AST 23  ALT 24  ALKPHOS 298*  BILITOT 0.6  PROT 7.4  ALBUMIN 3.5   No results for input(s): LIPASE, AMYLASE in the last 168 hours. No results for input(s): AMMONIA in the last 168 hours. Coagulation Profile: No results for input(s): INR, PROTIME in the last 168 hours. Cardiac Enzymes: No results for input(s): CKTOTAL, CKMB, CKMBINDEX, TROPONINI in the last 168 hours. BNP (last 3 results) No results for input(s): PROBNP in the last 8760 hours. HbA1C: No results for input(s): HGBA1C in the last 72 hours. CBG: No results for input(s): GLUCAP in the last 168 hours. Lipid Profile: No results for input(s): CHOL, HDL, LDLCALC, TRIG, CHOLHDL, LDLDIRECT in the last 72 hours. Thyroid  Function Tests: No results for input(s): TSH, T4TOTAL, FREET4, T3FREE, THYROIDAB in the last 72 hours. Anemia Panel: No results for input(s): VITAMINB12, FOLATE, FERRITIN, TIBC, IRON, RETICCTPCT in the last 72 hours. Urine analysis:    Component Value Date/Time   COLORURINE AMBER (A) 11/18/2018 0807   APPEARANCEUR CLEAR 11/18/2018 0807   LABSPEC 1.015 11/18/2018 0807   PHURINE 6.0 11/18/2018 0807   GLUCOSEU >=500 (A) 11/18/2018 0807   HGBUR NEGATIVE 11/18/2018 0807   BILIRUBINUR SMALL (A) 11/18/2018 0807   KETONESUR 5 (A) 11/18/2018 0807   PROTEINUR >=300 (A) 11/18/2018 0807   NITRITE NEGATIVE 11/18/2018 0807   LEUKOCYTESUR NEGATIVE 11/18/2018 0807   Sepsis Labs: @LABRCNTIP (procalcitonin:4,lacticidven:4) )No results found for this or any previous visit (from the past 240 hours).   Radiological Exams on Admission: DG Foot Complete Left Result Date: 09/19/2023 CLINICAL DATA:  Left foot ulcer.  Possible gangrene. EXAM: LEFT FOOT  - COMPLETE 3+ VIEW COMPARISON:  None Available. FINDINGS: There is no evidence of fracture or dislocation. There is no evidence of arthropathy or other focal bone abnormality. Vascular calcifications are noted. IMPRESSION: No fracture or dislocation is noted. No definite bony abnormality is noted. Electronically Signed   By: Lynwood Landy Raddle M.D.   On: 09/19/2023 12:48   Assessment/Plan   1. Left 2nd toe gangrene  - Left 2nd toe is gangrenous with cellulitis involving the forefoot; not septic on admission -  - Continue antibiotics, keep NPO after midnight, follow-up on vascular surgery recommendations  2. ESRD; hyperkalemia  - Give a dose of Loklema, restrict fluids, renally-dose medications, continue Sensipar  and Renvella, repeat chem panel in am, consult nephrology in am for maintenance HD    3. Hyponatremia  - Serum sodium is 129, corrects to 132 when accounting for hyperglycemia  - He appears mildly hypervolemic, is due for HD tomorrow  - Restrict fluids, repeat chem panel in am   4. Hx of CVA  - ASA, Crestor     DVT prophylaxis: SCDs  Code Status: Full  Level of Care: Level of care: Telemetry Surgical Family Communication: none present  Disposition Plan:  Patient is from: home  Anticipated d/c is to: TBD Anticipated d/c date is: 09/22/23  Patient currently: Pending vascular surgery consultation, possible vascular intervention and/or amputation  Consults called: Vascular surgery  Admission status: Inpatient     Evalene GORMAN Sprinkles, MD [facility]  09/20/2023, 1:32 AM

## 2023-09-20 NOTE — Consult Note (Signed)
 [facility] Renal Consultation Note    Indication for Consultation:  Management of ESRD/hemodialysis; anemia, hypertension/volume and secondary hyperparathyroidism   HPI: Edgar Brewer is a 63 y.o. male with ESRD on HD MWF, DM, H/o CVA who is admitted with left toe gangrene. VVS performed lower extremity angiogram today which demonstrated severe pedal disease and performed angioplasty to the left posterior tibial artery. Optimized for further amputation. On admission labs notable for Na 129, K 5.7, Glu 275, Hgb 12.4. Nephrology consulted for dialysis needs.   Last dialysis was Monday via AVF. Occasionally has some short treatments but usually leaving near dry weight.  Seen and examined at bedside. Lying in flat in bed. He has no complaints. Denies f/c, cp, sob, n/v. No problems with dialysis lately.   Past Medical History:  Diagnosis Date   Acute on chronic combined systolic and diastolic CHF, NYHA class 1 (HCC) 01/13/2015   Allergy    Arthritis    Chronic kidney disease    MWF [facility].   Diabetes mellitus type 2 with complications (HCC)    Previous insulin , non-insulin  requiring noted 09/2018.  Complications include retinopathy, peripheral neuropathy.   GERD (gastroesophageal reflux disease)    at times   Head injury with loss of consciousness (HCC)    History of kidney stones    Hypertension    Neuromuscular disorder (HCC)    Neuropathy    Pulmonary hypertension (HCC)    PA peak pressure 41 mmHg 03/04/16 echo   Stroke [facility])    no residual weakness   Varicose veins of lower extremities with complications 12/24/2014   Past Surgical History:  Procedure Laterality Date   AV FISTULA PLACEMENT Left 06/01/2016   Procedure: ARTERIOVENOUS (AV) FISTULA CREATION;  Surgeon: Oris Krystal FALCON, MD;  Location: [facility] OR;  Service: Vascular;  Laterality: Left;   AV FISTULA PLACEMENT Left 08/10/2017   Procedure: BRACHIOCEPHALIC ARTERIOVENOUS (AV) FISTULA CREATION LEFT UPPER EXTREMITY;   Surgeon: Eliza Lonni RAMAN, MD;  Location: [facility] OR;  Service: Vascular;  Laterality: Left;   CHOLECYSTECTOMY N/A 10/04/2018   Procedure: LAPAROSCOPIC CHOLECYSTECTOMY;  Surgeon: Stevie Herlene Righter, MD;  Location: [facility] OR;  Service: General;  Laterality: N/A;   COLONOSCOPY W/ POLYPECTOMY  03/2016   For evaluation of iron deficiency anemia.  Dr. Shila.  10 polyps, largest 20 mm.  Pathology: tubular adenomas with no high-grade dysplasia, hyperplastic.  None bleeding internal hemorrhoids.  Pandiverticulosis.   CYSTOSCOPY WITH RETROGRADE PYELOGRAM, URETEROSCOPY AND STENT PLACEMENT Bilateral 02/25/2021   Procedure: CYSTOSCOPY WITH BILATERAL  RETROGRADE PYELOGRAM, RIGHT DIAGNOSTIC URETEROSCOPY,  BLADDER BIOPSY WITH FULGERATION;  Surgeon: Alvaro Hummer, MD;  Location: [facility] ORS;  Service: Urology;  Laterality: Bilateral;   ENDOVENOUS ABLATION SAPHENOUS VEIN W/ LASER Left 07-23-2015   endovenous laser ablation left greater saphenous vein by Krystal Oris MD   ENDOVENOUS ABLATION SAPHENOUS VEIN W/ LASER Right 10/15/2015   EVLA right greater saphenous vein by Krystal Oris MD   ESOPHAGOGASTRODUODENOSCOPY  03/2016   For evaluation of IDA.  Dr. Shila.  Duodenal erythema, pathology benign mucosa, no villous atrophy.   GALLBLADDER SURGERY  10/2018   HOLMIUM LASER APPLICATION Right 02/25/2021   Procedure: HOLMIUM LASER OF TUMOR;  Surgeon: Alvaro Hummer, MD;  Location: [facility] ORS;  Service: Urology;  Laterality: Right;   IR RADIOLOGIST EVAL & MGMT  11/29/2018   TEE WITHOUT CARDIOVERSION N/A 11/19/2018   Procedure: TRANSESOPHAGEAL ECHOCARDIOGRAM (TEE);  Surgeon: Alveta Aleene PARAS, MD;  Location: [facility] ENDOSCOPY;  Service: Cardiovascular;  Laterality: N/A;  Family History  Problem Relation Age of Onset   Arthritis Mother    Hearing loss Father    Prostate cancer Father    Hyperlipidemia Maternal Grandmother    Heart disease Maternal Grandmother    Lung cancer Maternal Grandfather    Social History:  reports that  he quit smoking about 8 years ago. His smoking use included cigarettes. He started smoking about 53 years ago. He has a 45 pack-year smoking history. He has never used smokeless tobacco. He reports current alcohol use of about 1.0 standard drink of alcohol per week. He reports that he does not use drugs. Allergies  Allergen Reactions   Codeine Anaphylaxis, Hives, Swelling and Other (See Comments)    Swelling all over body and the throat   Clindamycin /Lincomycin Nausea And Vomiting   Prior to Admission medications   Medication Sig Start Date End Date Taking? Authorizing Provider  aspirin  EC 81 MG tablet Take 81 mg by mouth daily. Swallow whole.   Yes [provider]  b complex-vitamin c -folic acid  (NEPHRO-VITE) 0.8 MG TABS tablet Take 1 tablet by mouth in the morning.   Yes [provider]  cinacalcet  (SENSIPAR ) 60 MG tablet Take 60 mg by mouth every Tuesday, Thursday, and Saturday at 6 PM. In the evening.   Yes [provider]  gabapentin  (NEURONTIN ) 100 MG capsule TAKE 1 CAPSULE BY MOUTH THREE TIMES DAILY 07/13/23  Yes Katrinka Garnette KIDD, MD  insulin  glargine-yfgn (SEMGLEE ) 100 UNIT/ML Pen Inject into the skin. Patient taking differently: Inject 50 Units into the skin every morning. 08/01/23  Yes [provider]  omeprazole  (PRILOSEC) 20 MG capsule Take 1 capsule by mouth once daily Patient taking differently: Take 20 mg by mouth every evening. 07/13/23  Yes Katrinka Garnette KIDD, MD  rosuvastatin  (CRESTOR ) 10 MG tablet Take 1 tablet by mouth once daily 08/07/23  Yes Katrinka Garnette KIDD, MD  sevelamer  carbonate (RENVELA ) 800 MG tablet Take 2 tablets (1,600 mg total) by mouth 3 (three) times daily with meals. 10/22/18  Yes Cheryle Page, MD  BD PEN NEEDLE SHORT ULTRAFINE 31G X 8 MM MISC USE TO INJECT INSULIN  INTO THE SKIN DAILY. 07/31/23   Katrinka Garnette KIDD, MD   Current Facility-Administered Medications  Medication Dose Route Frequency Provider Last Rate Last Admin   0.9  %  sodium chloride  infusion  250 mL Intravenous PRN Magda Debby SAILOR, MD       0.9 %  sodium chloride  infusion  250 mL Intravenous PRN Hawken, Thomas N, MD       [MAR Hold] acetaminophen  (TYLENOL ) tablet 650 mg  650 mg Oral Q6H PRN Opyd, Timothy S, MD       Or   ILDA Hold] acetaminophen  (TYLENOL ) suppository 650 mg  650 mg Rectal Q6H PRN Opyd, Timothy S, MD       [MAR Hold] Ampicillin -Sulbactam (UNASYN ) 3 g in sodium chloride  0.9 % 100 mL IVPB  3 g Intravenous Q24H Gretta Levorn SQUIBB, RPH 200 mL/hr at 09/20/23 0350 3 g at 09/20/23 0350   [MAR Hold] aspirin  EC tablet 81 mg  81 mg Oral Daily Opyd, Timothy S, MD   81 mg at 09/20/23 1122   [MAR Hold] cinacalcet  (SENSIPAR ) tablet 60 mg  60 mg Oral Q T,Th,Sat-1800 Opyd, Timothy S, MD       clopidogrel  (PLAVIX ) 300 MG tablet            [START ON 09/21/2023] clopidogrel  (PLAVIX ) tablet 75 mg  75 mg Oral Q breakfast  Magda Debby SAILOR, MD       [MAR Hold] gabapentin  (NEURONTIN ) capsule 100 mg  100 mg Oral TID Opyd, Timothy S, MD       Heparin  (Porcine) in NaCl 1000-0.9 UT/500ML-% SOLN    PRN Magda Debby SAILOR, MD   1,000 mL at 09/20/23 1001   heparin  sodium (porcine) injection    PRN Magda Debby SAILOR, MD   10,000 Units at 09/20/23 9073   hydrALAZINE  (APRESOLINE ) injection 5 mg  5 mg Intravenous Q20 Min PRN Magda Debby SAILOR, MD       [facility] Hold] insulin  aspart (novoLOG ) injection 0-6 Units  0-6 Units Subcutaneous Q4H Opyd, Timothy S, MD   1 Units at 09/20/23 0354   [MAR Hold] insulin  glargine-yfgn (SEMGLEE ) injection 20 Units  20 Units Subcutaneous Daily Opyd, Timothy S, MD       iodixanol  (VISIPAQUE ) 320 MG/ML injection    PRN Hawken, Thomas N, MD   50 mL at 09/20/23 1001   labetalol  (NORMODYNE ) injection 10 mg  10 mg Intravenous Q10 min PRN Magda Debby SAILOR, MD       lidocaine  (PF) (XYLOCAINE ) 1 % injection    PRN Hawken, Thomas N, MD   5 mL at 09/20/23 0913   [MAR Hold] linezolid  (ZYVOX ) tablet 600 mg  600 mg Oral Q12H Opyd, Evalene RAMAN, MD       oxyCODONE  (Oxy  IR/ROXICODONE ) 5 MG immediate release tablet            [MAR Hold] oxyCODONE  (Oxy IR/ROXICODONE ) immediate release tablet 5 mg  5 mg Oral Q6H PRN Opyd, Timothy S, MD   5 mg at 09/20/23 1119   [MAR Hold] pantoprazole  (PROTONIX ) EC tablet 40 mg  40 mg Oral Daily Opyd, Timothy S, MD       [MAR Hold] prochlorperazine  (COMPAZINE ) injection 5 mg  5 mg Intravenous Q6H PRN Opyd, Timothy S, MD       [MAR Hold] rosuvastatin  (CRESTOR ) tablet 10 mg  10 mg Oral Daily Opyd, Timothy S, MD       [MAR Hold] senna (SENOKOT) tablet 8.6 mg  1 tablet Oral Daily PRN Opyd, Timothy S, MD       [MAR Hold] sevelamer  carbonate (RENVELA ) tablet 1,600 mg  1,600 mg Oral TID WC Opyd, Timothy S, MD       [MAR Hold] sodium chloride  flush (NS) 0.9 % injection 3 mL  3 mL Intravenous Q12H Opyd, Timothy S, MD       sodium chloride  flush (NS) 0.9 % injection 3 mL  3 mL Intravenous Q12H Magda Debby SAILOR, MD       sodium chloride  flush (NS) 0.9 % injection 3 mL  3 mL Intravenous PRN Magda Debby SAILOR, MD         ROS: As per HPI otherwise negative.  Physical Exam: Vitals:   09/20/23 1305 09/20/23 1315 09/20/23 1330 09/20/23 1340  BP: (!) 122/101 136/76 123/67 (!) 126/57  Pulse: 89 86 87 84  Resp: 20 20 (!) 23 20  Temp:      TempSrc:      SpO2: 95% 94% (!) 89% 92%  Weight:      Height:         General: Appears comfortable, in no distress  Head: NCAT sclera not icteric MMM Neck: Supple. No JVD appreciated  Lungs: Clear bilaterally, normal wob Heart: RRR, no murmur, no rub Abdomen: soft non-tender, no masses  Lower extremities: No significant LE edema; necrotic left 2nd toe.  Neuro: A & O X 3. Follows commands  Psych:  Normal affect  Dialysis Access: AVF   Labs: Basic Metabolic Panel: Recent Labs  Lab 09/19/23 1612 09/20/23 0509  NA 129* 135  K 5.7* 5.6*  CL 91* 98  CO2 23 23  GLUCOSE 275* 161*  BUN 35* 41*  CREATININE 10.67* 11.50*  CALCIUM  9.8 9.9   Liver Function Tests: Recent Labs  Lab 09/19/23 1612   AST 23  ALT 24  ALKPHOS 298*  BILITOT 0.6  PROT 7.4  ALBUMIN 3.5   No results for input(s): LIPASE, AMYLASE in the last 168 hours. No results for input(s): AMMONIA in the last 168 hours. CBC: Recent Labs  Lab 09/19/23 1612 09/20/23 0509  WBC 7.6 7.5  NEUTROABS 6.0  --   HGB 12.4* 11.5*  HCT 37.5* 34.4*  MCV 96.9 95.6  PLT 230 199   Cardiac Enzymes: No results for input(s): CKTOTAL, CKMB, CKMBINDEX, TROPONINI in the last 168 hours. CBG: Recent Labs  Lab 09/20/23 0347 09/20/23 1111  GLUCAP 174* 134*   Iron Studies: No results for input(s): IRON, TIBC, TRANSFERRIN, FERRITIN in the last 72 hours. Studies/Results: DG Foot Complete Left Result Date: 09/19/2023 CLINICAL DATA:  Left foot ulcer.  Possible gangrene. EXAM: LEFT FOOT - COMPLETE 3+ VIEW COMPARISON:  None Available. FINDINGS: There is no evidence of fracture or dislocation. There is no evidence of arthropathy or other focal bone abnormality. Vascular calcifications are noted. IMPRESSION: No fracture or dislocation is noted. No definite bony abnormality is noted. Electronically Signed   By: Lynwood Landy Raddle M.D.   On: 09/19/2023 12:48    Dialysis Orders:  [facility] MWF  4:00 BFR 400 EDW 97.4 kg 2K/2.5Ca AVF Hep 2600 U  -No ESA  -Calcitriol 1.5 q HD   Assessment/Plan: PAD/ L toe gangrene. S/p LE angiogram with L posterior tibial angioplasty per VVS. Optimized for toe amputation. Cont ASA/Plavix .  ESRD.  HD MWF. Continue on schedule. HD this evening.  Access. AVF. No issues reported  Hypertension. BP acceptable  Volume. Appears euvolemic. UF to EDW.  Anemia. Hgb at goal. No ESA needs.  Metabolic bone disease.  Cont calcitriol; sevelamer  binder  T2DM. Insulin  per primary   [name] PA-C  [facility] 09/20/2023, 2:23 PM

## 2023-09-20 NOTE — Plan of Care (Signed)

## 2023-09-20 NOTE — Progress Notes (Signed)
 80f sheath removed from RFA at 1240.  Manual pressured applied for 20 min.  No hematoma or complications.  Doppled RDP pulse pre and post groin hold.  VSS.  Bed rest instructions given.  Pt. Understands.

## 2023-09-21 ENCOUNTER — Encounter (HOSPITAL_COMMUNITY): Payer: Self-pay

## 2023-09-21 ENCOUNTER — Encounter (HOSPITAL_COMMUNITY): Payer: Self-pay | Admitting: Family Medicine

## 2023-09-21 ENCOUNTER — Inpatient Hospital Stay (HOSPITAL_COMMUNITY): Payer: Medicare (Managed Care) | Admitting: Anesthesiology

## 2023-09-21 ENCOUNTER — Other Ambulatory Visit: Payer: Self-pay

## 2023-09-21 ENCOUNTER — Encounter (HOSPITAL_COMMUNITY)
Admission: EM | Disposition: A | Discharge status: Home Health | Payer: Self-pay | Source: Home / Self Care | Attending: Internal Medicine

## 2023-09-21 ENCOUNTER — Inpatient Hospital Stay (HOSPITAL_COMMUNITY): Payer: Medicare (Managed Care)

## 2023-09-21 DIAGNOSIS — Z9862 Peripheral vascular angioplasty status: Secondary | ICD-10-CM

## 2023-09-21 DIAGNOSIS — N186 End stage renal disease: Secondary | ICD-10-CM

## 2023-09-21 DIAGNOSIS — E1152 Type 2 diabetes mellitus with diabetic peripheral angiopathy with gangrene: Secondary | ICD-10-CM

## 2023-09-21 DIAGNOSIS — Z9889 Other specified postprocedural states: Secondary | ICD-10-CM

## 2023-09-21 DIAGNOSIS — I679 Cerebrovascular disease, unspecified: Secondary | ICD-10-CM

## 2023-09-21 DIAGNOSIS — I120 Hypertensive chronic kidney disease with stage 5 chronic kidney disease or end stage renal disease: Secondary | ICD-10-CM | POA: Diagnosis not present

## 2023-09-21 DIAGNOSIS — I96 Gangrene, not elsewhere classified: Secondary | ICD-10-CM | POA: Diagnosis not present

## 2023-09-21 HISTORY — PX: AMPUTATION TOE: SHX6595

## 2023-09-21 LAB — GLUCOSE, CAPILLARY
Glucose-Capillary: 105 mg/dL — ABNORMAL HIGH (ref 70–99)
Glucose-Capillary: 114 mg/dL — ABNORMAL HIGH (ref 70–99)
Glucose-Capillary: 117 mg/dL — ABNORMAL HIGH (ref 70–99)
Glucose-Capillary: 152 mg/dL — ABNORMAL HIGH (ref 70–99)
Glucose-Capillary: 175 mg/dL — ABNORMAL HIGH (ref 70–99)
Glucose-Capillary: 188 mg/dL — ABNORMAL HIGH (ref 70–99)
Glucose-Capillary: 242 mg/dL — ABNORMAL HIGH (ref 70–99)
Glucose-Capillary: 85 mg/dL (ref 70–99)

## 2023-09-21 LAB — CBC
HCT: 36.3 % — ABNORMAL LOW (ref 39.0–52.0)
Hemoglobin: 12.1 g/dL — ABNORMAL LOW (ref 13.0–17.0)
MCH: 31.8 pg (ref 26.0–34.0)
MCHC: 33.3 g/dL (ref 30.0–36.0)
MCV: 95.5 fL (ref 80.0–100.0)
Platelets: 218 K/uL (ref 150–400)
RBC: 3.8 MIL/uL — ABNORMAL LOW (ref 4.22–5.81)
RDW: 14 % (ref 11.5–15.5)
WBC: 7.3 K/uL (ref 4.0–10.5)
nRBC: 0 % (ref 0.0–0.2)

## 2023-09-21 LAB — LIPID PANEL
Cholesterol: 117 mg/dL (ref 0–200)
HDL: 32 mg/dL — ABNORMAL LOW (ref >40)
LDL Cholesterol: 31 mg/dL (ref 0–99)
Total CHOL/HDL Ratio: 3.7 ratio
Triglycerides: 268 mg/dL — ABNORMAL HIGH (ref <150)
VLDL: 54 mg/dL — ABNORMAL HIGH (ref 0–40)

## 2023-09-21 LAB — RENAL FUNCTION PANEL
Albumin: 3 g/dL — ABNORMAL LOW (ref 3.5–5.0)
Anion gap: 15 (ref 5–15)
BUN: 44 mg/dL — ABNORMAL HIGH (ref 8–23)
CO2: 23 mmol/L (ref 22–32)
Calcium: 9.9 mg/dL (ref 8.9–10.3)
Chloride: 98 mmol/L (ref 98–111)
Creatinine, Ser: 11.13 mg/dL — ABNORMAL HIGH (ref 0.61–1.24)
GFR, Estimated: 5 mL/min — ABNORMAL LOW (ref >60)
Glucose, Bld: 119 mg/dL — ABNORMAL HIGH (ref 70–99)
Phosphorus: 6.4 mg/dL — ABNORMAL HIGH (ref 2.5–4.6)
Potassium: 5.9 mmol/L — ABNORMAL HIGH (ref 3.5–5.1)
Sodium: 136 mmol/L (ref 135–145)

## 2023-09-21 LAB — BASIC METABOLIC PANEL WITH GFR
Anion gap: 16 — ABNORMAL HIGH (ref 5–15)
BUN: 44 mg/dL — ABNORMAL HIGH (ref 8–23)
CO2: 23 mmol/L (ref 22–32)
Calcium: 10 mg/dL (ref 8.9–10.3)
Chloride: 97 mmol/L — ABNORMAL LOW (ref 98–111)
Creatinine, Ser: 11.22 mg/dL — ABNORMAL HIGH (ref 0.61–1.24)
GFR, Estimated: 5 mL/min — ABNORMAL LOW (ref >60)
Glucose, Bld: 128 mg/dL — ABNORMAL HIGH (ref 70–99)
Potassium: 5.7 mmol/L — ABNORMAL HIGH (ref 3.5–5.1)
Sodium: 136 mmol/L (ref 135–145)

## 2023-09-21 LAB — HEPATITIS B SURFACE ANTIBODY, QUANTITATIVE: Hep B S AB Quant (Post): 24.4 m[IU]/mL

## 2023-09-21 SURGERY — AMPUTATION, TOE
Anesthesia: Monitor Anesthesia Care | Site: Toe | Laterality: Left

## 2023-09-21 MED ORDER — OXYCODONE HCL 5 MG/5ML PO SOLN: 5.0000 mg | Freq: Once | ORAL | Status: DC | PRN

## 2023-09-21 MED ORDER — HYDROMORPHONE HCL 1 MG/ML IJ SOLN: 0.2500 mg | INTRAMUSCULAR | Status: DC | PRN

## 2023-09-21 MED ORDER — PROPOFOL 500 MG/50ML IV EMUL
INTRAVENOUS | Status: DC | PRN
  Administered 2023-09-21: 75 ug/kg/min via INTRAVENOUS

## 2023-09-21 MED ORDER — SODIUM CHLORIDE 0.9 % IV SOLN: INTRAVENOUS | Status: DC | PRN

## 2023-09-21 MED ORDER — PHENYLEPHRINE HCL (PRESSORS) 10 MG/ML IV SOLN
INTRAVENOUS | Status: DC | PRN
  Administered 2023-09-21: 80 ug via INTRAVENOUS

## 2023-09-21 MED ORDER — ORAL CARE MOUTH RINSE: 15.0000 mL | Freq: Once | OROMUCOSAL | Status: AC

## 2023-09-21 MED ORDER — LIDOCAINE HCL (PF) 1 % IJ SOLN
INTRAMUSCULAR | Status: DC | PRN
  Administered 2023-09-21: 10 mL via INTRAMUSCULAR

## 2023-09-21 MED ORDER — MIDAZOLAM HCL 5 MG/5ML IJ SOLN
INTRAMUSCULAR | Status: DC | PRN
  Administered 2023-09-21 (×2): 1 mg via INTRAVENOUS

## 2023-09-21 MED ORDER — AMOXICILLIN-POT CLAVULANATE 500-125 MG PO TABS: 1.0000 | ORAL_TABLET | ORAL | Status: DC

## 2023-09-21 MED ORDER — AMOXICILLIN-POT CLAVULANATE 500-125 MG PO TABS
1.0000 | ORAL_TABLET | ORAL | Status: DC
  Administered 2023-09-22: 1 via ORAL
  Filled 2023-09-21: qty 1

## 2023-09-21 MED ORDER — BUPIVACAINE HCL (PF) 0.5 % IJ SOLN
INTRAMUSCULAR | Status: AC
Start: 2023-09-21 — End: 2023-09-21
  Filled 2023-09-21: qty 30

## 2023-09-21 MED ORDER — CHLORHEXIDINE GLUCONATE 0.12 % MT SOLN
OROMUCOSAL | Status: AC
  Administered 2023-09-21: 15 mL via OROMUCOSAL
  Filled 2023-09-21: qty 15

## 2023-09-21 MED ORDER — ACETAMINOPHEN 500 MG PO TABS: 1000.0000 mg | ORAL_TABLET | Freq: Once | ORAL | Status: AC

## 2023-09-21 MED ORDER — OXYCODONE HCL 5 MG PO TABS: 5.0000 mg | ORAL_TABLET | Freq: Once | ORAL | Status: DC | PRN

## 2023-09-21 MED ORDER — CHLORHEXIDINE GLUCONATE 0.12 % MT SOLN: 15.0000 mL | Freq: Once | OROMUCOSAL | Status: AC

## 2023-09-21 MED ORDER — MIDAZOLAM HCL 2 MG/2ML IJ SOLN
INTRAMUSCULAR | Status: AC
  Filled 2023-09-21: qty 2

## 2023-09-21 MED ORDER — ACETAMINOPHEN 500 MG PO TABS
ORAL_TABLET | ORAL | Status: AC
  Administered 2023-09-21: 1000 mg via ORAL
  Filled 2023-09-21: qty 2

## 2023-09-21 MED ORDER — LIDOCAINE HCL (PF) 1 % IJ SOLN
INTRAMUSCULAR | Status: AC
  Filled 2023-09-21: qty 30

## 2023-09-21 MED ORDER — ONDANSETRON HCL 4 MG/2ML IJ SOLN: 4.0000 mg | Freq: Once | INTRAMUSCULAR | Status: DC | PRN

## 2023-09-21 MED ORDER — SODIUM CHLORIDE 0.9 % IV SOLN: INTRAVENOUS | Status: DC

## 2023-09-21 MED ORDER — 0.9 % SODIUM CHLORIDE (POUR BTL) OPTIME
TOPICAL | Status: DC | PRN
Start: 2023-09-21 — End: 2023-09-21
  Administered 2023-09-21: 1000 mL

## 2023-09-21 MED ORDER — FENTANYL CITRATE (PF) 100 MCG/2ML IJ SOLN
INTRAMUSCULAR | Status: AC
  Filled 2023-09-21: qty 2

## 2023-09-21 SURGICAL SUPPLY — 37 items
BLADE AVERAGE 25X9 (BLADE) IMPLANT
BLADE SURG 10 STRL SS (BLADE) ×1 IMPLANT
BLADE SURG 15 STRL LF DISP TIS (BLADE) ×1 IMPLANT
BNDG COHESIVE 3X5 TAN ST LF (GAUZE/BANDAGES/DRESSINGS) ×1 IMPLANT
BNDG COMPR ESMARK 4X3 LF (GAUZE/BANDAGES/DRESSINGS) ×1 IMPLANT
BNDG ELASTIC 3INX 5YD STR LF (GAUZE/BANDAGES/DRESSINGS) ×1 IMPLANT
BNDG ELASTIC 4INX 5YD STR LF (GAUZE/BANDAGES/DRESSINGS) IMPLANT
BNDG GAUZE DERMACEA FLUFF 4 (GAUZE/BANDAGES/DRESSINGS) IMPLANT
CHLORAPREP W/TINT 26 (MISCELLANEOUS) IMPLANT
DRSG ADAPTIC 3X8 NADH LF (GAUZE/BANDAGES/DRESSINGS) IMPLANT
DRSG XEROFORM 1X8 (GAUZE/BANDAGES/DRESSINGS) IMPLANT
ELECTRODE REM PT RTRN 9FT ADLT (ELECTROSURGICAL) ×1 IMPLANT
GAUZE PAD ABD 8X10 STRL (GAUZE/BANDAGES/DRESSINGS) IMPLANT
GAUZE SPONGE 2X2 STRL 8-PLY (GAUZE/BANDAGES/DRESSINGS) IMPLANT
GAUZE SPONGE 4X4 12PLY STRL (GAUZE/BANDAGES/DRESSINGS) ×1 IMPLANT
GAUZE STRETCH 2X75IN STRL (MISCELLANEOUS) ×1 IMPLANT
GAUZE XEROFORM 1X8 LF (GAUZE/BANDAGES/DRESSINGS) ×1 IMPLANT
GAUZE XEROFORM 5X9 LF (GAUZE/BANDAGES/DRESSINGS) IMPLANT
GLOVE BIO SURGEON STRL SZ7.5 (GLOVE) ×1 IMPLANT
GLOVE BIOGEL PI IND STRL 7.5 (GLOVE) ×1 IMPLANT
GOWN STRL REUS W/ TWL LRG LVL3 (GOWN DISPOSABLE) ×2 IMPLANT
KIT BASIN OR (CUSTOM PROCEDURE TRAY) ×1 IMPLANT
NDL HYPO 25X1 1.5 SAFETY (NEEDLE) ×1 IMPLANT
NEEDLE HYPO 25X1 1.5 SAFETY (NEEDLE) ×1 IMPLANT
PACK ORTHO EXTREMITY (CUSTOM PROCEDURE TRAY) ×1 IMPLANT
PADDING CAST ABS COTTON 4X4 ST (CAST SUPPLIES) ×2 IMPLANT
SET HNDPC FAN SPRY TIP SCT (DISPOSABLE) IMPLANT
SPIKE FLUID TRANSFER (MISCELLANEOUS) IMPLANT
STOCKINETTE 4X48 STRL (DRAPES) IMPLANT
SUT ETHILON 3 0 FSLX (SUTURE) IMPLANT
SUT PROLENE 3 0 PS 2 (SUTURE) IMPLANT
SUT PROLENE 4 0 PS 2 18 (SUTURE) IMPLANT
SYR CONTROL 10ML LL (SYRINGE) ×1 IMPLANT
TUBE CONNECTING 12X1/4 (SUCTIONS) IMPLANT
UNDERPAD 30X36 HEAVY ABSORB (UNDERPADS AND DIAPERS) ×1 IMPLANT
WATER STERILE IRR 1000ML POUR (IV SOLUTION) ×1 IMPLANT
YANKAUER SUCT BULB TIP NO VENT (SUCTIONS) IMPLANT

## 2023-09-21 NOTE — Progress Notes (Signed)
  Received patient in bed to unit.  Alert and oriented.  Informed consent signed and in chart. Yes  TX duration: 3.5  Patient tolerated well. yes Transported back to the room Yes Alert, without acute distress. Yes Hand-off given to patient's nurse. Yes Willie Coup, RN  Access used: Left arm AVF Access issues: None  Total UF removed: Medication(s) given: None Post HD VS: T98.3-HR80-RR23-B/P118/56 Post HD weight: 97.1 kg  Neville Seip, RN [facility]    09/21/23 0045  Vitals  Temp 98.3 F (36.8 C)  Temp Source Oral  BP (!) 118/56  MAP (mmHg) 75  BP Location Right Arm  BP Method Automatic  Patient Position (if appropriate) Lying  Pulse Rate 78  ECG Heart Rate 79  Resp (!) 22  Weight 97.1 kg  Type of Weight Post-Dialysis  Oxygen Therapy  SpO2 94 %  O2 Device Nasal Cannula  O2 Flow Rate (L/min) 2 L/min  Patient Activity (if Appropriate) In bed  Pulse Oximetry Type Continuous  During Treatment Monitoring  Blood Flow Rate (mL/min) 0 mL/min  Arterial Pressure (mmHg) -1.21 mmHg  Venous Pressure (mmHg) -2.02 mmHg  TMP (mmHg) 6.06 mmHg  Ultrafiltration Rate (mL/min) 0 mL/min  Dialysate Flow Rate (mL/min) 299 ml/min  Dialysate Potassium Concentration 2  Dialysate Calcium  Concentration 2.5  Duration of HD Treatment -hour(s) 3.5 hour(s)  Cumulative Fluid Removed (mL) per Treatment  1814.66  HD Safety Checks Performed Yes  Intra-Hemodialysis Comments Tolerated well;Tx completed  Post Treatment  Dialyzer Clearance Lightly streaked  Liters Processed 84  Fluid Removed (mL) 1800 mL  Tolerated HD Treatment Yes  Post-Hemodialysis Comments Stable post treatment  AVG/AVF Arterial Site Held (minutes) 10 minutes  AVG/AVF Venous Site Held (minutes) 10 minutes

## 2023-09-21 NOTE — Anesthesia Preprocedure Evaluation (Signed)
 Anesthesia Evaluation  Patient identified by MRN, date of birth, ID band Patient awake    Reviewed: Allergy & Precautions, H&P , NPO status , Patient's Chart, lab work & pertinent test results  Airway Mallampati: IV  TM Distance: >3 FB Neck ROM: Full    Dental  (+) Poor Dentition, Chipped, Dental Advisory Given   Pulmonary neg pulmonary ROS, former smoker   Pulmonary exam normal breath sounds clear to auscultation       Cardiovascular hypertension (115/66 preop), Normal cardiovascular exam+ Valvular Problems/Murmurs (mild AS) AS  Rhythm:Regular Rate:Normal  Echo 2022 1. Left ventricular ejection fraction, by estimation, is 60 to 65%. The  left ventricle has normal function. The left ventricle has no regional  wall motion abnormalities. Left ventricular diastolic parameters are  consistent with Grade I diastolic  dysfunction (impaired relaxation).   2. Right ventricular systolic function is normal. The right ventricular  size is normal.   3. Left atrial size was moderately dilated.   4. The mitral valve is normal in structure. No evidence of mitral valve  regurgitation. No evidence of mitral stenosis.   5. The aortic valve is tricuspid. There is moderate calcification of the  aortic valve. There is moderate thickening of the aortic valve. Aortic  valve regurgitation is not visualized. Mild aortic valve stenosis.   6. The inferior vena cava is normal in size with greater than 50%  respiratory variability, suggesting right atrial pressure of 3 mmHg.     Neuro/Psych CVA  negative psych ROS   GI/Hepatic Neg liver ROS,GERD  Controlled and Medicated,,  Endo/Other  diabetes, Well Controlled, Type 2, Insulin  Dependent  BMI 33  Renal/GU ESRF and DialysisRenal diseaseK 5.7 this AM  negative genitourinary   Musculoskeletal  (+) Arthritis , Osteoarthritis,  L 2nd toe gangrene   Abdominal   Peds negative pediatric ROS (+)   Hematology negative hematology ROS (+) Hb 12.1 plt 218    Anesthesia Other Findings   Reproductive/Obstetrics negative OB ROS                              Anesthesia Physical Anesthesia Plan  ASA: 4  Anesthesia Plan: MAC   Post-op Pain Management:    Induction:   PONV Risk Score and Plan: 2 and Propofol  infusion and TIVA  Airway Management Planned: Natural Airway and Simple Face Mask  Additional Equipment: None  Intra-op Plan:   Post-operative Plan:   Informed Consent: I have reviewed the patients History and Physical, chart, labs and discussed the procedure including the risks, benefits and alternatives for the proposed anesthesia with the patient or authorized representative who has indicated his/her understanding and acceptance.       Plan Discussed with: CRNA  Anesthesia Plan Comments:          Anesthesia Quick Evaluation

## 2023-09-21 NOTE — Op Note (Signed)
 Full Operative Report  Date of Operation: 11:54 AM, 09/21/2023   Patient: Edgar Brewer - 63 y.o. male  Surgeon: Malvin Marsa FALCON, DPM   Assistant: None  Diagnosis: Left 2nd toe gangrene  Procedure:  1. Amputation of left 2nd toe through proximal phalanx base    Anesthesia: Anesthesia type not filed in the log.  No responsible provider has been recorded for the case.  Anesthesiologist: Merla Almarie HERO, DO CRNA: Arvell Edsel HERO, CRNA   Estimated Blood Loss: Minimal   Hemostasis: 1) Anatomical dissection, mechanical compression, electrocautery 2) no tourniquet was used  Implants: * No implants in log *  Materials: prolene 3-0  Injectables: 1) Pre-operatively: 10 cc of 50:50 mixture 1%lidocaine  plain and 0.5% marcaine  plain 2) Post-operatively: None   Specimens: - Pathology: L 2nd toe - Microbiology: none   Antibiotics: IV antibiotics given per schedule on the floor  Drains: None  Complications: Patient tolerated the procedure well without complication.   Operative findings: As below in detailed report  Indications for Procedure: Edgar Brewer presents to [facility], [place] with a chief complaint of gangrene and cellulitis of left 2nd toe. The patient has failed conservative treatments of various modalities. At this time the patient has elected to proceed with surgical correction. All alternatives, risks, and complications of the procedures were thoroughly explained to the patient. Patient exhibits appropriate understanding of all discussion points and informed consent was signed and obtained in the chart with no guarantees to surgical outcome given or implied.  Description of Procedure: Patient was brought to the operating room. Patient remained on their hospital bed in the supine position. A surgical timeout was performed and all members of the operating room, the procedure, and the surgical site were identified. anesthesia occurred as per  anesthesia record. Local anesthetic as previously described was then injected about the operative field in a local infiltrative block.  The operative lower extremity as noted above was then prepped and draped in the usual sterile manner. The following procedure then began.  Attention was directed to the 2nd digit on the LEFT foot. A full-thickness incision encompassing the entire digit was made using a #15 blade. Dissection was carried down to bone. The toe was secured with a towel clamp, further dissected in its entirety, and disarticulated at the pipj and passed to the back table as a gross specimen. This was then labled and sent to pathology.  A bone cutting rongeur was used to cut through the base of the proximal phalanx for closure and margin purposes.  All remaining necrotic and devitalized soft tissue structures were visualized and dissected away using sharp and dull dissection. Care was taken to protect all neurovascular structures throughout the dissection. All bleeders were cauterized as necessary. The area was then flushed with copious amounts of sterile saline. Then using the suture materials previously described, the site was closed in anatomic layers and the skin was well approximated under minimal tension.   The surgical site was then dressed with xeroform 4x4 kerlix and ace wrap. The patient tolerated both the procedure and anesthesia well with vital signs stable throughout. The patient was transferred in good condition and all vital signs stable  from the OR to recovery under the discretion of anesthesia.  Condition: Vital signs stable, neurovascular status unchanged from preoperative   Surgical plan:  Expect clean margin, decent bleeding seen at the amputation site hopeful for adequate flow to heal. WABT in psot op shoe. 5 days Augmentin  recommended from OR. Stable  for DC home tomorrow, leave dressing c/d/I until follow up next week Thursday   The patient will be WBAT in a post op shoe  to the operative limb until further instructed. The dressing is to remain clean, dry, and intact. Will continue to follow unless noted elsewhere.   Marsa Honour, DPM [facility]

## 2023-09-21 NOTE — Anesthesia Procedure Notes (Signed)
 Procedure Name: MAC Date/Time: 09/21/2023 12:24 PM  Performed by: Arvell Edsel HERO, CRNAPre-anesthesia Checklist: Patient identified, Emergency Drugs available, Suction available, Patient being monitored and Timeout performed Patient Re-evaluated:Patient Re-evaluated prior to induction Oxygen Delivery Method: Simple face mask

## 2023-09-21 NOTE — Progress Notes (Signed)
 PROGRESS NOTE  Edgar Brewer FMW:[id number] DOB: 1960-05-31 DOA: 09/19/2023 PCP: Katrinka Garnette KIDD, MD   LOS: 2 days   Brief Narrative / Interim history:  63 y.o. male with medical history significant for diabetes mellitus, history of CVA, and ESRD on hemodialysis who presents for evaluation of left second toe discoloration and drainage.  Vascular and podiatry consulted  Subjective / 24h Interval events: He is doing well today, denies any chest pain, no shortness of breath.  Was able to get dialyzed very late last night.  Assesement and Plan: Principal Problem:   Gangrene of toe of left foot (HCC) Active Problems:   ESRD (end stage renal disease) on dialysis (HCC)   Type 2 diabetes mellitus with diabetic neuropathy, unspecified (HCC)   History of stroke   Hyperkalemia   Hyponatremia   Principal problem Left second toe gangrene -vascular surgery consulted, he is status post left lower extremity angiogram on 8/27, status post left posterior tibial angioplasty, felt to be optimized for toe amputation - Remains at high risk for needing more proximal amputation, but vascularly he is optimized - Podiatry consulted, he is planning to go to the OR today with Dr. Malvin - Continue antibiotics for now  Active problems ESRD, hyperkalemia-nephrology consulted, underwent HD late last night.  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  Scheduled Meds:  aspirin  EC  81 mg Oral Daily   Chlorhexidine  Gluconate Cloth  6 each Topical Q0600   cinacalcet   60 mg Oral Q T,Th,Sat-1800   clopidogrel   75 mg Oral Q breakfast   gabapentin   100 mg Oral TID   insulin  aspart  0-6 Units Subcutaneous Q4H   insulin  glargine-yfgn  20 Units Subcutaneous Daily   linezolid   600 mg Oral Q12H   pantoprazole   40 mg Oral Daily   rosuvastatin   10 mg Oral Daily   sevelamer  carbonate  1,600 mg Oral TID WC   sodium chloride  flush  3 mL Intravenous Q12H    sodium chloride  flush  3 mL Intravenous Q12H   Continuous Infusions:  sodium chloride      ampicillin -sulbactam (UNASYN ) IV 3 g (09/21/23 0257)   PRN Meds:.sodium chloride , acetaminophen  **OR** acetaminophen , hydrALAZINE , labetalol , oxyCODONE , prochlorperazine , senna, sodium chloride  flush  Current Outpatient Medications  Medication Instructions   aspirin  EC 81 mg, Daily   b complex-vitamin c -folic acid  (NEPHRO-VITE) 0.8 MG TABS tablet 1 tablet, Every morning   BD PEN NEEDLE SHORT ULTRAFINE 31G X 8 MM MISC USE TO INJECT INSULIN  INTO THE SKIN DAILY.   cinacalcet  (SENSIPAR ) 60 mg, Every T-Th-Sa (1800)   gabapentin  (NEURONTIN ) 100 mg, Oral, 3 times daily   insulin  glargine-yfgn (SEMGLEE ) 100 UNIT/ML Pen Inject into the skin.   omeprazole  (PRILOSEC) 20 mg, Oral, Daily   rosuvastatin  (CRESTOR ) 10 mg, Oral, Daily   sevelamer  carbonate (RENVELA ) 1,600 mg, Oral, 3 times daily with meals    Diet Orders (From admission, onward)     Start     Ordered   09/20/23 1027  Diet renal/carb modified with fluid restriction Diet-HS Snack? Nothing; Fluid restriction: 1200 mL Fluid; Room service appropriate? Yes; Fluid consistency: Thin  Diet effective now       Question Answer Comment  Diet-HS Snack? Nothing   Fluid restriction: 1200 mL Fluid   Room service appropriate? Yes   Fluid consistency: Thin      09/20/23 1026            DVT prophylaxis: SCDs Start:  09/20/23 0131   Lab Results  Component Value Date   PLT 218 09/21/2023      Code Status: Full Code  Family Communication: No family at bedside  Status is: Inpatient Remains inpatient appropriate because: severity of illness  Level of care: Progressive Cardiac  Consultants:  Vascular surgery Podiatry Nephrology  Objective: Vitals:   09/21/23 0045 09/21/23 0200 09/21/23 0420 09/21/23 0800  BP: (!) 118/56 (!) 113/54 (!) 114/52 123/63  Pulse: 78  82 89  Resp: (!) 22  19 20   Temp: 98.3 F (36.8 C) 98.3 F (36.8 C) 98 F  (36.7 C) 98.9 F (37.2 C)  TempSrc: Oral Oral Oral Oral  SpO2: 94%  95%   Weight: 97.1 kg     Height:        Intake/Output Summary (Last 24 hours) at 09/21/2023 0919 Last data filed at 09/21/2023 0045 Gross per 24 hour  Intake 100.06 ml  Output 1800 ml  Net -1699.94 ml   Wt Readings from Last 3 Encounters:  09/21/23 97.1 kg  09/19/23 99.2 kg  08/29/23 100.4 kg    Examination:  Constitutional: NAD Eyes: lids and conjunctivae normal, no scleral icterus ENMT: mmm Neck: normal, supple Respiratory: clear to auscultation bilaterally, no wheezing, no crackles. Cardiovascular: Regular rate and rhythm, no murmurs / rubs / gallops. Abdomen: soft, no distention, no tenderness. Bowel sounds positive.    Data Reviewed: I have independently reviewed following labs and imaging studies   CBC Recent Labs  Lab 09/19/23 1612 09/20/23 0509 09/21/23 0506  WBC 7.6 7.5 7.3  HGB 12.4* 11.5* 12.1*  HCT 37.5* 34.4* 36.3*  PLT 230 199 218  MCV 96.9 95.6 95.5  MCH 32.0 31.9 31.8  MCHC 33.1 33.4 33.3  RDW 13.7 13.8 14.0  LYMPHSABS 0.9  --   --   MONOABS 0.6  --   --   EOSABS 0.1  --   --   BASOSABS 0.1  --   --     Recent Labs  Lab 09/19/23 1612 09/19/23 1618 09/20/23 0509 09/21/23 0506  NA 129*  --  135 136  K 5.7*  --  5.6* 5.7*  CL 91*  --  98 97*  CO2 23  --  23 23  GLUCOSE 275*  --  161* 128*  BUN 35*  --  41* 44*  CREATININE 10.67*  --  11.50* 11.22*  CALCIUM  9.8  --  9.9 10.0  AST 23  --   --   --   ALT 24  --   --   --   ALKPHOS 298*  --   --   --   BILITOT 0.6  --   --   --   ALBUMIN 3.5  --   --   --   LATICACIDVEN  --  1.2  --   --     ------------------------------------------------------------------------------------------------------------------ No results for input(s): CHOL, HDL, LDLCALC, TRIG, CHOLHDL, LDLDIRECT in the last 72 hours.  Lab Results  Component Value Date   HGBA1C 7.0 (A) 06/15/2023    ------------------------------------------------------------------------------------------------------------------ No results for input(s): TSH, T4TOTAL, T3FREE, THYROIDAB in the last 72 hours.  Invalid input(s): FREET3  Cardiac Enzymes No results for input(s): CKMB, TROPONINI, MYOGLOBIN in the last 168 hours.  Invalid input(s): CK ------------------------------------------------------------------------------------------------------------------    Component Value Date/Time   BNP 929.7 (H) 05/20/2020 0411    CBG: Recent Labs  Lab 09/20/23 1111 09/20/23 1603 09/21/23 0121 09/21/23 0418 09/21/23 0805  GLUCAP 134* 143* 114* 117*  175*    Recent Results (from the past 240 hours)  Blood Cultures x 2 sites     Status: None (Preliminary result)   Collection Time: 09/19/23  4:09 PM   Specimen: BLOOD  Result Value Ref Range Status   Specimen Description BLOOD BLOOD RIGHT FOREARM  Final   Special Requests   Final    BOTTLES DRAWN AEROBIC AND ANAEROBIC Blood Culture adequate volume   Culture   Final    NO GROWTH < 24 HOURS Performed at [facility], [address] [place], [place] [address]    Report Status PENDING  Incomplete  Blood Cultures x 2 sites     Status: None (Preliminary result)   Collection Time: 09/19/23  4:12 PM   Specimen: BLOOD  Result Value Ref Range Status   Specimen Description BLOOD SITE NOT SPECIFIED  Final   Special Requests   Final    BOTTLES DRAWN AEROBIC ONLY Blood Culture results may not be optimal due to an inadequate volume of blood received in culture bottles   Culture   Final    NO GROWTH < 24 HOURS Performed at [facility], [address], [place], [place] [address]    Report Status PENDING  Incomplete     Radiology Studies: No results found.    Nilda Fendt, MD, PhD [facility]  Between 7 am - 7 pm I am available, please contact me via Amion (for emergencies) or Securechat (non urgent  messages)  Between 7 pm - 7 am I am not available, please contact night coverage MD/APP via Amion

## 2023-09-21 NOTE — Progress Notes (Addendum)
  Progress Note    09/21/2023 6:50 AM 1 Day Post-Op  Subjective:  wants to know what time his surgery is.    Tm 89 now afebrile  Vitals:   09/21/23 0200 09/21/23 0420  BP: (!) 113/54 (!) 114/52  Pulse:  82  Resp:  19  Temp: 98.3 F (36.8 C) 98 F (36.7 C)  SpO2:  95%    Physical Exam: General:  no distress Cardiac:  regular Lungs:  non labored Incisions:  right groin is soft without hematoma Extremities:  brisk multiphasic AT>PT and monophasic peroneal doppler signal; left calf is soft and non tender.   CBC    Component Value Date/Time   WBC 7.3 09/21/2023 0506   RBC 3.80 (L) 09/21/2023 0506   HGB 12.1 (L) 09/21/2023 0506   HGB 10.4 (L) 02/29/2016 1439   HCT 36.3 (L) 09/21/2023 0506   HCT 31.2 (L) 02/29/2016 1439   PLT 218 09/21/2023 0506   PLT 229 02/29/2016 1439   MCV 95.5 09/21/2023 0506   MCV 85 02/29/2016 1439   MCH 31.8 09/21/2023 0506   MCHC 33.3 09/21/2023 0506   RDW 14.0 09/21/2023 0506   RDW 14.9 02/29/2016 1439   LYMPHSABS 0.9 09/19/2023 1612   MONOABS 0.6 09/19/2023 1612   EOSABS 0.1 09/19/2023 1612   BASOSABS 0.1 09/19/2023 1612    BMET    Component Value Date/Time   NA 136 09/21/2023 0506   NA 139 06/03/2020 0000   K 5.7 (H) 09/21/2023 0506   CL 97 (L) 09/21/2023 0506   CO2 23 09/21/2023 0506   GLUCOSE 128 (H) 09/21/2023 0506   BUN 44 (H) 09/21/2023 0506   BUN 47 (H) 03/04/2016 1317   CREATININE 11.22 (H) 09/21/2023 0506   CREATININE 4.06 (H) 12/19/2018 1015   CALCIUM  10.0 09/21/2023 0506   GFRNONAA 5 (L) 09/21/2023 0506   GFRAA 11 (L) 11/20/2018 0320    INR    Component Value Date/Time   INR 1.0 11/14/2018 1702     Intake/Output Summary (Last 24 hours) at 09/21/2023 0650 Last data filed at 09/21/2023 0045 Gross per 24 hour  Intake 100.06 ml  Output 1800 ml  Net -1699.94 ml      Assessment/Plan:  63 y.o. male is s/p:  Angiogram with angioplasty left PTA via right CFA 09/20/2023 by Dr. Magda  1 Day Post-Op   -pt  doing well this am with brisk doppler flow left AT/PT/pero.  Plan for surgery today with Dr. Malvin.  Discussed with pt it could be earlier or later depending on OR schedule.  -continue asa/plavix /statin -f/u in 4-6 weeks with LLE arterial duplex and ABI.  Our office will arrange appt.    Lucie Apt, PA-C [facility] [phone] 09/21/2023 6:50 AM   I agree with the above  Malvina New

## 2023-09-21 NOTE — Transfer of Care (Signed)
 Immediate Anesthesia Transfer of Care Note  Patient: Edgar Brewer  Procedure(s) Performed: AMPUTATION, TOE (Left: Toe)  Patient Location: PACU  Anesthesia Type:MAC  Level of Consciousness: drowsy and patient cooperative  Airway & Oxygen Therapy: Patient Spontanous Breathing and Patient connected to face mask oxygen  Post-op Assessment: Report given to RN, Post -op Vital signs reviewed and stable, Patient moving all extremities, and Patient moving all extremities X 4  Post vital signs: Reviewed and stable  Last Vitals:  Vitals Value Taken Time  BP 105/85 09/21/23 12:40  Temp 36.7 C 09/21/23 12:39  Pulse 72 09/21/23 12:44  Resp 21 09/21/23 12:44  SpO2 96 % 09/21/23 12:44  Vitals shown include unfiled device data.  Last Pain:  Vitals:   09/21/23 1140  TempSrc:   PainSc: 0-No pain      Patients Stated Pain Goal: 0 (09/21/23 0740)  Complications: No notable events documented.

## 2023-09-21 NOTE — Progress Notes (Signed)
 History and Physical Interval Note:  09/21/2023 11:54 AM  Edgar Brewer  has presented today for surgery, with the diagnosis of gangrene of the left 2nd toe.  The various methods of treatment have been discussed with the patient and family. After consideration of risks, benefits and other options for treatment, the patient has consented to   Procedure(s) with comments: AMPUTATION, TOE (Left) - Left 2nd toe amputation as a surgical intervention.  The patient's history has been reviewed, patient examined, no change in status, stable for surgery.  I have reviewed the patient's chart and labs.  Questions were answered to the patient's satisfaction.     Marsa FALCON Jaydynn Wolford

## 2023-09-21 NOTE — Plan of Care (Signed)

## 2023-09-21 NOTE — Progress Notes (Signed)
  [facility] Progress Note   Subjective: Seen in room. Completed dialysis last night - net UF 1.8L. Concerning that K 5.7 after dialysis.  No complaints this morning. Plan for toe amputation today.   Objective Vitals:   09/21/23 0045 09/21/23 0200 09/21/23 0420 09/21/23 0800  BP: (!) 118/56 (!) 113/54 (!) 114/52 123/63  Pulse: 78  82 89  Resp: (!) 22  19 20   Temp: 98.3 F (36.8 C) 98.3 F (36.8 C) 98 F (36.7 C) 98.9 F (37.2 C)  TempSrc: Oral Oral Oral Oral  SpO2: 94%  95%   Weight: 97.1 kg     Height:        Additional Objective Labs: Basic Metabolic Panel: Recent Labs  Lab 09/19/23 1612 09/20/23 0509 09/21/23 0506  NA 129* 135 136  K 5.7* 5.6* 5.7*  CL 91* 98 97*  CO2 23 23 23   GLUCOSE 275* 161* 128*  BUN 35* 41* 44*  CREATININE 10.67* 11.50* 11.22*  CALCIUM  9.8 9.9 10.0   CBC: Recent Labs  Lab 09/19/23 1612 09/20/23 0509 09/21/23 0506  WBC 7.6 7.5 7.3  NEUTROABS 6.0  --   --   HGB 12.4* 11.5* 12.1*  HCT 37.5* 34.4* 36.3*  MCV 96.9 95.6 95.5  PLT 230 199 218   Blood Culture    Component Value Date/Time   SDES BLOOD SITE NOT SPECIFIED 09/19/2023 1612   SPECREQUEST  09/19/2023 1612    BOTTLES DRAWN AEROBIC ONLY Blood Culture results may not be optimal due to an inadequate volume of blood received in culture bottles   CULT  09/19/2023 1612    NO GROWTH < 24 HOURS Performed at [facility], [address] [place], [place] [address]    REPTSTATUS PENDING 09/19/2023 1612     Physical Exam General: Alert, nad  Heart: RRR Lungs: Clear, normal wob Abdomen: non -tender Extremities: no LE edema  Dialysis Access: L AVF +bruit   Medications:  sodium chloride      ampicillin -sulbactam (UNASYN ) IV 3 g (09/21/23 0257)    aspirin  EC  81 mg Oral Daily   Chlorhexidine  Gluconate Cloth  6 each Topical Q0600   cinacalcet   60 mg Oral Q T,Th,Sat-1800   clopidogrel   75 mg Oral Q breakfast   gabapentin   100 mg Oral TID   insulin  aspart   0-6 Units Subcutaneous Q4H   insulin  glargine-yfgn  20 Units Subcutaneous Daily   linezolid   600 mg Oral Q12H   pantoprazole   40 mg Oral Daily   rosuvastatin   10 mg Oral Daily   sevelamer  carbonate  1,600 mg Oral TID WC   sodium chloride  flush  3 mL Intravenous Q12H   sodium chloride  flush  3 mL Intravenous Q12H   Dialysis Orders:  [facility] MWF  4:00 BFR 400 EDW 97.4 kg 2K/2.5Ca AVF Hep 2600 U  -No ESA  -Calcitriol 1.5 q HD    Assessment/Plan: PAD/ L toe gangrene. S/p LE angiogram with L posterior tibial angioplasty per VVS. Cont ASA/Plavix .  For toe amputation per podiatry.  ESRD.  HD MWF. Continue on schedule. Next HD Friday Hyperkalemia. K 5.7 post HD? Repeat labs.  Access. AVF. No issues reported  Hypertension. BP acceptable  Volume. Appears euvolemic. UF to EDW.  Anemia. Hgb at goal. No ESA needs.  Metabolic bone disease.  Cont calcitriol; sevelamer  binder  T2DM. Insulin  per primary   [name] PA-C [facility] 09/21/2023,9:06 AM

## 2023-09-21 NOTE — Progress Notes (Signed)
 Orthopedic Tech Progress Note Patient Details:  Edgar Brewer 1960/05/14 [id number]  Ortho Devices Type of Ortho Device: Postop shoe/boot Ortho Device/Splint Location: LLE Ortho Device/Splint Interventions: Ordered, Application   Post Interventions Patient Tolerated: Well  Efrain DELENA Cos 09/21/2023, 12:59 PM

## 2023-09-21 NOTE — Anesthesia Postprocedure Evaluation (Signed)
 Anesthesia Post Note  Patient: Edgar Brewer  Procedure(s) Performed: AMPUTATION, TOE (Left: Toe)     Patient location during evaluation: PACU Anesthesia Type: MAC Level of consciousness: awake and alert Pain management: pain level controlled Vital Signs Assessment: post-procedure vital signs reviewed and stable Respiratory status: spontaneous breathing, nonlabored ventilation and respiratory function stable Cardiovascular status: blood pressure returned to baseline and stable Postop Assessment: no apparent nausea or vomiting Anesthetic complications: no   No notable events documented.  Last Vitals:  Vitals:   09/21/23 1300 09/21/23 1315  BP: 118/65 119/70  Pulse: 78 79  Resp: 19 20  Temp:  36.5 C  SpO2: 95% 94%    Last Pain:  Vitals:   09/21/23 1239  TempSrc:   PainSc: Asleep                 Almarie CHRISTELLA Marchi

## 2023-09-22 ENCOUNTER — Other Ambulatory Visit (HOSPITAL_COMMUNITY): Payer: Self-pay

## 2023-09-22 ENCOUNTER — Encounter (HOSPITAL_COMMUNITY): Payer: Self-pay | Admitting: Podiatry

## 2023-09-22 DIAGNOSIS — I96 Gangrene, not elsewhere classified: Secondary | ICD-10-CM | POA: Diagnosis not present

## 2023-09-22 LAB — CBC
HCT: 38.4 % — ABNORMAL LOW (ref 39.0–52.0)
Hemoglobin: 12.7 g/dL — ABNORMAL LOW (ref 13.0–17.0)
MCH: 32.1 pg (ref 26.0–34.0)
MCHC: 33.1 g/dL (ref 30.0–36.0)
MCV: 97 fL (ref 80.0–100.0)
Platelets: 243 K/uL (ref 150–400)
RBC: 3.96 MIL/uL — ABNORMAL LOW (ref 4.22–5.81)
RDW: 14 % (ref 11.5–15.5)
WBC: 7.2 K/uL (ref 4.0–10.5)
nRBC: 0 % (ref 0.0–0.2)

## 2023-09-22 LAB — COMPREHENSIVE METABOLIC PANEL WITH GFR
ALT: 44 U/L (ref 0–44)
AST: 32 U/L (ref 15–41)
Albumin: 3.1 g/dL — ABNORMAL LOW (ref 3.5–5.0)
Alkaline Phosphatase: 297 U/L — ABNORMAL HIGH (ref 38–126)
Anion gap: 20 — ABNORMAL HIGH (ref 5–15)
BUN: 62 mg/dL — ABNORMAL HIGH (ref 8–23)
CO2: 21 mmol/L — ABNORMAL LOW (ref 22–32)
Calcium: 9.5 mg/dL (ref 8.9–10.3)
Chloride: 95 mmol/L — ABNORMAL LOW (ref 98–111)
Creatinine, Ser: 13.72 mg/dL — ABNORMAL HIGH (ref 0.61–1.24)
GFR, Estimated: 4 mL/min — ABNORMAL LOW (ref >60)
Glucose, Bld: 160 mg/dL — ABNORMAL HIGH (ref 70–99)
Potassium: 6.5 mmol/L (ref 3.5–5.1)
Sodium: 136 mmol/L (ref 135–145)
Total Bilirubin: 0.8 mg/dL (ref 0.0–1.2)
Total Protein: 6.7 g/dL (ref 6.5–8.1)

## 2023-09-22 LAB — BASIC METABOLIC PANEL WITH GFR
Anion gap: 20 — ABNORMAL HIGH (ref 5–15)
BUN: 62 mg/dL — ABNORMAL HIGH (ref 8–23)
CO2: 20 mmol/L — ABNORMAL LOW (ref 22–32)
Calcium: 9.6 mg/dL (ref 8.9–10.3)
Chloride: 95 mmol/L — ABNORMAL LOW (ref 98–111)
Creatinine, Ser: 13.8 mg/dL — ABNORMAL HIGH (ref 0.61–1.24)
GFR, Estimated: 4 mL/min — ABNORMAL LOW (ref >60)
Glucose, Bld: 181 mg/dL — ABNORMAL HIGH (ref 70–99)
Potassium: 5.9 mmol/L — ABNORMAL HIGH (ref 3.5–5.1)
Sodium: 135 mmol/L (ref 135–145)

## 2023-09-22 LAB — GLUCOSE, CAPILLARY
Glucose-Capillary: 152 mg/dL — ABNORMAL HIGH (ref 70–99)
Glucose-Capillary: 177 mg/dL — ABNORMAL HIGH (ref 70–99)
Glucose-Capillary: 166 mg/dL — ABNORMAL HIGH (ref 70–99)
Glucose-Capillary: 117 mg/dL — ABNORMAL HIGH (ref 70–99)

## 2023-09-22 LAB — MAGNESIUM: Magnesium: 2.2 mg/dL (ref 1.7–2.4)

## 2023-09-22 MED ORDER — HEPARIN SODIUM (PORCINE) 1000 UNIT/ML DIALYSIS: 1000.0000 [IU] | INTRAMUSCULAR | Status: DC | PRN

## 2023-09-22 MED ORDER — OXYCODONE HCL 5 MG PO TABS
5.0000 mg | ORAL_TABLET | Freq: Four times a day (QID) | ORAL | 0 refills | Status: DC | PRN
  Filled 2023-09-22: qty 15, 4d supply, fill #0

## 2023-09-22 MED ORDER — INSULIN ASPART 100 UNIT/ML IJ SOLN: 0.0000 [IU] | Freq: Every day | INTRAMUSCULAR | Status: DC

## 2023-09-22 MED ORDER — PENTAFLUOROPROP-TETRAFLUOROETH EX AERO
1.0000 | INHALATION_SPRAY | CUTANEOUS | Status: DC | PRN
Start: 2023-09-22 — End: 2023-09-22

## 2023-09-22 MED ORDER — ALTEPLASE 2 MG IJ SOLR: 2.0000 mg | Freq: Once | INTRAMUSCULAR | Status: DC | PRN

## 2023-09-22 MED ORDER — AMOXICILLIN-POT CLAVULANATE 500-125 MG PO TABS
1.0000 | ORAL_TABLET | ORAL | 0 refills | Status: AC
  Filled 2023-09-22: qty 5, 5d supply, fill #0

## 2023-09-22 MED ORDER — LIDOCAINE HCL (PF) 1 % IJ SOLN: 5.0000 mL | INTRAMUSCULAR | Status: DC | PRN

## 2023-09-22 MED ORDER — ANTICOAGULANT SODIUM CITRATE 4% (200MG/5ML) IV SOLN: 5.0000 mL | Status: DC | PRN

## 2023-09-22 MED ORDER — INSULIN ASPART 100 UNIT/ML IJ SOLN: 0.0000 [IU] | Freq: Three times a day (TID) | INTRAMUSCULAR | Status: DC

## 2023-09-22 MED ORDER — LIDOCAINE-PRILOCAINE 2.5-2.5 % EX CREA
1.0000 | TOPICAL_CREAM | CUTANEOUS | Status: DC | PRN
Start: 2023-09-22 — End: 2023-09-22

## 2023-09-22 NOTE — TOC Initial Note (Signed)
 Transition of Care [facility]) - Initial/Assessment Note    Patient Details  Name: Edgar Brewer MRN: [id number] Date of Birth: 10-04-1960  Transition [facility] Care [facility]) CM/SW Contact:    Sudie Erminio Deems, RN Phone Number: 09/22/2023, 2:42 PM  Clinical Narrative:  Patient presented for toe pain-Hx ESRD MWF. Patient s/p amputation of left second toe-will follow up with podiatry. PT/OT recommendations for [facility] PT-patient did not have an agency preference and is agreeable to use [facility]. Referral submitted and start of care to begin within 24-48 hours post transition home. DME rolling walker delivered to the room via [employer]. No further needs identified at this time. Patient's mother to transport patient home. No further needs identified.              Expected Discharge Plan: Home w Home Health Services Barriers to Discharge: No Barriers Identified   Patient Goals and CMS Choice Patient states their goals for this hospitalization and ongoing recovery are:: plan to return home once stable.   Choice offered to / list presented to :  (Patient had no agency preference.)      Expected Discharge Plan and Services   Discharge Planning Services: CM Consult Post Acute Care Choice: Home Health Living arrangements for the past 2 months: Single Family Home Expected Discharge Date: 09/22/23               DME Arranged: Vannie rolling DME Agency: [employer] Date DME Agency Contacted: 09/22/23 Time DME Agency Contacted: 1441 Representative spoke with at DME Agency: [place] HH Arranged: PT HH Agency: [facility] Date [facility] Agency Contacted: 09/22/23 Time HH Agency Contacted: 1441 Representative spoke with at [facility] Agency: Amy  Prior Living Arrangements/Services Living arrangements for the past 2 months: Single Family Home Lives with:: Parents Patient language and need for interpreter reviewed:: Yes Do you feel safe going back to the place where you live?: Yes      Need  for Family Participation in Patient Care: No (Comment) Care giver support system in place?: No (comment)   Criminal Activity/Legal Involvement Pertinent to Current Situation/Hospitalization: No - Comment as needed  Activities of Daily Living   ADL Screening (condition at time of admission) Independently performs ADLs?: Yes (appropriate for developmental age) Is the patient deaf or have difficulty hearing?: No Does the patient have difficulty seeing, even when wearing glasses/contacts?: Yes Does the patient have difficulty concentrating, remembering, or making decisions?: No  Permission Sought/Granted Permission sought to share information with : Family Supports, Magazine features editor, Case Estate manager/land agent granted to share information with : Yes, Verbal Permission Granted     Permission granted to share info w AGENCY: [facility]; [employer]        Emotional Assessment Appearance:: Appears stated age Attitude/Demeanor/Rapport: Engaged Affect (typically observed): Appropriate Orientation: : Oriented to Self, Oriented to Place, Oriented to  Time Alcohol / Substance Use: Not Applicable Psych Involvement: No (comment)  Admission diagnosis:  Toe infection [L08.9] Gangrene of left foot (HCC) [I96] Gangrene of toe of left foot (HCC) [I96] Patient Active Problem List   Diagnosis Date Noted   Hyperkalemia 09/20/2023   Hyponatremia 09/20/2023   Gangrene of toe of left foot (HCC) 09/19/2023   Fatty liver 02/10/2022   Wound of left leg 05/08/2020   Left leg cellulitis 05/08/2020   GERD (gastroesophageal reflux disease) 03/25/2020   Aortic stenosis 02/25/2020   Type 2 diabetes mellitus with diabetic neuropathy, unspecified (HCC) 02/25/2020   History of stroke 02/25/2020   Gallbladder  abscess 11/15/2018   Elevated LFTs 10/18/2018   ESRD (end stage renal disease) on dialysis (HCC) 10/03/2018   Other disorders of phosphorus metabolism 01/19/2018   Secondary hyperparathyroidism of  renal origin (HCC) 09/27/2017   CKD (chronic kidney disease), stage IV (HCC) 01/14/2015   Diabetes mellitus, type II, insulin  dependent (HCC) 01/14/2015   Obesity 01/14/2015   Essential hypertension 01/14/2015   Anemia of chronic kidney failure 01/14/2015   Chronic diastolic CHF (congestive heart failure) (HCC) 01/13/2015   Varicose veins of lower extremities with complications 12/24/2014   Chronic venous insufficiency 10/22/2014   PCP:  Katrinka Garnette KIDD, MD Pharmacy:   [facility] [address] [place] - [address] [place] [address] Phone: [phone] Fax: [phone]  [facility]  - [place], [place] - [address] [place] [address] Phone: [phone] Fax: [phone]  [facility] [address] [place] [address] Phone: [phone] Fax: [phone]  [facility] [address] [place] [address] Phone: [phone] Fax: [phone]     Social Drivers of Health (SDOH) Social History: SDOH Screenings   Food Insecurity: No Food Insecurity (09/20/2023)  Housing: Low Risk  (09/20/2023)  Transportation Needs: No Transportation Needs (09/20/2023)  Utilities: Not At Risk (09/20/2023)  Depression (PHQ2-9): Low Risk  (09/19/2023)  Financial Resource Strain: Low Risk  (04/19/2022)  Physical Activity: Inactive (04/19/2022)  Social Connections: Unknown (04/19/2022)  Stress: No Stress Concern Present (04/19/2022)  Tobacco Use: Medium Risk (09/21/2023)   SDOH Interventions:     Readmission Risk Interventions     No data to display

## 2023-09-22 NOTE — Discharge Summary (Signed)
 Physician Discharge Summary  Edgar Brewer FMW:[id number] DOB: [birth year] DOA: 09/19/2023  PCP: Katrinka Garnette KIDD, MD  Admit date: 09/19/2023 Discharge date: 09/22/2023  Admitted From: home Disposition:  home  Recommendations for Outpatient Follow-up:  Follow up with PCP in 1-2 weeks Please obtain BMP/CBC in one week Follow-up with podiatry next week as scheduled  Home Health: none Equipment/Devices: none  Discharge Condition: stable CODE STATUS: Full code Diet Orders (From admission, onward)     Start     Ordered   09/20/23 1027  Diet renal/carb modified with fluid restriction Diet-HS Snack? Nothing; Fluid restriction: 1200 mL Fluid; Room service appropriate? Yes; Fluid consistency: Thin  Diet effective now       Question Answer Comment  Diet-HS Snack? Nothing   Fluid restriction: 1200 mL Fluid   Room service appropriate? Yes   Fluid consistency: Thin      09/20/23 1026            Brief Narrative / Interim history:  63 y.o. male with medical history significant for diabetes mellitus, history of CVA, and ESRD on hemodialysis who presents for evaluation of left second toe discoloration and drainage.  Vascular and podiatry consulted  Hospital Course / Discharge diagnoses: Principal Problem:   Gangrene of toe of left foot (HCC) Active Problems:   ESRD (end stage renal disease) on dialysis (HCC)   Type 2 diabetes mellitus with diabetic neuropathy, unspecified (HCC)   History of stroke   Hyperkalemia   Hyponatremia   Principal problem Left second toe gangrene -vascular surgery consulted, he is status post left lower extremity angiogram on 8/27, status post left posterior tibial angioplasty, felt to be optimized.  Podiatry consulted as well, he was taken to the OR on 8/28 status post amputation of the left second toe through proximal phalanx base.  Per podiatry clean margin is expected, recommending Augmentin  for 5 additional days.  He is to be weightbearing as tolerated  in postop shoe, dressing stays on until he sees podiatry next week  Active problems ESRD, hyperkalemia-nephrology consulted, dialyzed prior to discharge.  Continue outpatient HD  Hyponatremia -in the setting of ESRD, fluid management with dialysis  Prior CVA-continue aspirin , Crestor  Obesity, class I-BMI 43  Sepsis ruled out   Discharge Instructions   Allergies as of 09/22/2023       Reactions   Codeine Anaphylaxis, Hives, Swelling, Other (See Comments)   Swelling all over body and the throat   Clindamycin /lincomycin Nausea And Vomiting        Medication List     TAKE these medications    amoxicillin -clavulanate 500-125 MG tablet Commonly known as: AUGMENTIN  Take 1 tablet by mouth daily for 5 days. Start taking on: September 23, 2023   aspirin  EC 81 MG tablet Take 81 mg by mouth daily. Swallow whole.   b complex-vitamin c -folic acid  0.8 MG Tabs tablet Take 1 tablet by mouth in the morning.   BD Pen Needle Short Ultrafine 31G X 8 MM Misc Generic drug: Insulin  Pen Needle USE TO INJECT INSULIN  INTO THE SKIN DAILY.   cinacalcet  60 MG tablet Commonly known as: SENSIPAR  Take 60 mg by mouth every Tuesday, Thursday, and Saturday at 6 PM. In the evening.   gabapentin  100 MG capsule Commonly known as: NEURONTIN  TAKE 1 CAPSULE BY MOUTH THREE TIMES DAILY   insulin  glargine-yfgn 100 UNIT/ML Pen Commonly known as: SEMGLEE  Inject into the skin. What changed:  how much to take when to take this   omeprazole  20 MG  capsule Commonly known as: PRILOSEC Take 1 capsule by mouth once daily What changed: when to take this   oxyCODONE  5 MG immediate release tablet Commonly known as: Oxy IR/ROXICODONE  Take 1 tablet (5 mg total) by mouth every 6 (six) hours as needed for moderate pain (pain score 4-6).   rosuvastatin  10 MG tablet Commonly known as: CRESTOR  Take 1 tablet by mouth once daily   sevelamer  carbonate 800 MG tablet Commonly known as: RENVELA  Take 2 tablets (1,600  mg total) by mouth 3 (three) times daily with meals.       Consultations: Nephrology Podiatry Vascular surgery  Procedures/Studies:  DG Foot 2 Views Left Result Date: 09/21/2023 CLINICAL DATA:  Postoperative state. EXAM: LEFT FOOT - 2 VIEW COMPARISON:  Left foot radiograph dated 09/19/2023. FINDINGS: Status post amputation at the base of the proximal phalanx of the second digit. No acute fracture or dislocation. Old fracture of the distal fifth metatarsal. The bones are osteopenic. Vascular calcifications noted. The soft tissues are grossly unremarkable. Overlying dressing noted. IMPRESSION: Status post amputation at the base of the proximal phalanx of the second digit. Electronically Signed   By: Vanetta Chou M.D.   On: 09/21/2023 13:51   DG Foot Complete Left Result Date: 09/19/2023 CLINICAL DATA:  Left foot ulcer.  Possible gangrene. EXAM: LEFT FOOT - COMPLETE 3+ VIEW COMPARISON:  None Available. FINDINGS: There is no evidence of fracture or dislocation. There is no evidence of arthropathy or other focal bone abnormality. Vascular calcifications are noted. IMPRESSION: No fracture or dislocation is noted. No definite bony abnormality is noted. Electronically Signed   By: Lynwood Landy Raddle M.D.   On: 09/19/2023 12:48   DG Foot Complete Left Result Date: 08/29/2023 CLINICAL DATA:  2nd toe ulceration/cellulitis. EXAM: LEFT FOOT - COMPLETE 3+ VIEW COMPARISON:  None Available. FINDINGS: Diffuse vascular calcifications. No acute bony abnormality. Specifically, no fracture, subluxation, or dislocation. No bone destruction to suggest osteomyelitis. Joint spaces maintained. IMPRESSION: No acute bony abnormality. Diffuse vascular calcifications. Electronically Signed   By: Franky Crease M.D.   On: 08/29/2023 15:25    Subjective: - no chest pain, shortness of breath, no abdominal pain, nausea or vomiting.   Discharge Exam: BP (!) 99/57   Pulse 78   Temp 97.6 F (36.4 C)   Resp (!) 22   Ht 5' 8  (1.727 m)   Wt 97.9 kg   SpO2 93%   BMI 32.82 kg/m   General: Pt is alert, awake, not in acute distress Cardiovascular: RRR, S1/S2 +, no rubs, no gallops Respiratory: CTA bilaterally, no wheezing, no rhonchi Abdominal: Soft, NT, ND, bowel sounds + Extremities: no edema, no cyanosis   The results of significant diagnostics from this hospitalization (including imaging, microbiology, ancillary and laboratory) are listed below for reference.     Microbiology: Recent Results (from the past 240 hours)  Blood Cultures x 2 sites     Status: None (Preliminary result)   Collection Time: 09/19/23  4:09 PM   Specimen: BLOOD  Result Value Ref Range Status   Specimen Description BLOOD BLOOD RIGHT FOREARM  Final   Special Requests   Final    BOTTLES DRAWN AEROBIC AND ANAEROBIC Blood Culture adequate volume   Culture   Final    NO GROWTH 3 DAYS Performed at [facility], [address] [place], [place] [address]    Report Status PENDING  Incomplete  Blood Cultures x 2 sites     Status: None (Preliminary result)  Collection Time: 09/19/23  4:12 PM   Specimen: BLOOD  Result Value Ref Range Status   Specimen Description BLOOD SITE NOT SPECIFIED  Final   Special Requests   Final    BOTTLES DRAWN AEROBIC ONLY Blood Culture results may not be optimal due to an inadequate volume of blood received in culture bottles   Culture   Final    NO GROWTH 3 DAYS Performed at [name], [address], [place], [place] [address]    Report Status PENDING  Incomplete     Labs: Basic Metabolic Panel: Recent Labs  Lab 09/19/23 1612 09/20/23 0509 09/21/23 0506 09/22/23 0646 09/22/23 0705  NA 129* 135 136  136 136 135  K 5.7* 5.6* 5.9*  5.7* 6.5* 5.9*  CL 91* 98 98  97* 95* 95*  CO2 23 23 23  23  21* 20*  GLUCOSE 275* 161* 119*  128* 160* 181*  BUN 35* 41* 44*  44* 62* 62*  CREATININE 10.67* 11.50* 11.13*  11.22* 13.72* 13.80*  CALCIUM  9.8 9.9 9.9  10.0 9.5 9.6  MG  --    --   --  2.2  --   PHOS  --   --  6.4*  --   --    Liver Function Tests: Recent Labs  Lab 09/19/23 1612 09/21/23 0506 09/22/23 0646  AST 23  --  32  ALT 24  --  44  ALKPHOS 298*  --  297*  BILITOT 0.6  --  0.8  PROT 7.4  --  6.7  ALBUMIN 3.5 3.0* 3.1*   CBC: Recent Labs  Lab 09/19/23 1612 09/20/23 0509 09/21/23 0506 09/22/23 0501  WBC 7.6 7.5 7.3 7.2  NEUTROABS 6.0  --   --   --   HGB 12.4* 11.5* 12.1* 12.7*  HCT 37.5* 34.4* 36.3* 38.4*  MCV 96.9 95.6 95.5 97.0  PLT 230 199 218 243   CBG: Recent Labs  Lab 09/21/23 2034 09/21/23 2347 09/22/23 0332 09/22/23 0655 09/22/23 0739  GLUCAP 188* 152* 152* 177* 166*   Hgb A1c No results for input(s): HGBA1C in the last 72 hours. Lipid Profile Recent Labs    09/21/23 0506  CHOL 117  HDL 32*  LDLCALC 31  TRIG 731*  CHOLHDL 3.7   Thyroid  function studies No results for input(s): TSH, T4TOTAL, T3FREE, THYROIDAB in the last 72 hours.  Invalid input(s): FREET3 Urinalysis    Component Value Date/Time   COLORURINE AMBER (A) 11/18/2018 0807   APPEARANCEUR CLEAR 11/18/2018 0807   LABSPEC 1.015 11/18/2018 0807   PHURINE 6.0 11/18/2018 0807   GLUCOSEU >=500 (A) 11/18/2018 0807   HGBUR NEGATIVE 11/18/2018 0807   BILIRUBINUR SMALL (A) 11/18/2018 0807   KETONESUR 5 (A) 11/18/2018 0807   PROTEINUR >=300 (A) 11/18/2018 0807   NITRITE NEGATIVE 11/18/2018 0807   LEUKOCYTESUR NEGATIVE 11/18/2018 9192    FURTHER DISCHARGE INSTRUCTIONS:   Get Medicines reviewed and adjusted: Please take all your medications with you for your next visit with your Primary MD   Laboratory/radiological data: Please request your Primary MD to go over all hospital tests and procedure/radiological results at the follow up, please ask your Primary MD to get all Hospital records sent to his/her office.   In some cases, they will be blood work, cultures and biopsy results pending at the time of your discharge. Please request that  your primary care M.D. goes through all the records of your hospital data and follows up on these results.  Also Note the following: If you experience worsening of your admission symptoms, develop shortness of breath, life threatening emergency, suicidal or homicidal thoughts you must seek medical attention immediately by calling 911 or calling your MD immediately  if symptoms less severe.   You must read complete instructions/literature along with all the possible adverse reactions/side effects for all the Medicines you take and that have been prescribed to you. Take any new Medicines after you have completely understood and accpet all the possible adverse reactions/side effects.    Do not drive when taking Pain medications or sleeping medications (Benzodaizepines)   Do not take more than prescribed Pain, Sleep and Anxiety Medications. It is not advisable to combine anxiety,sleep and pain medications without talking with your primary care practitioner   Special Instructions: If you have smoked or chewed Tobacco  in the last 2 yrs please stop smoking, stop any regular Alcohol  and or any Recreational drug use.   Wear Seat belts while driving.   Please note: You were cared for by a hospitalist during your hospital stay. Once you are discharged, your primary care physician will handle any further medical issues. Please note that NO REFILLS for any discharge medications will be authorized once you are discharged, as it is imperative that you return to your primary care physician (or establish a relationship with a primary care physician if you do not have one) for your post hospital discharge needs so that they can reassess your need for medications and monitor your lab values.  Time coordinating discharge: 40 minutes  SIGNED:  Nilda Fendt, MD, PhD 09/22/2023, 10:44 AM

## 2023-09-22 NOTE — Evaluation (Signed)
 Physical Therapy Evaluation Patient Details Name: Edgar Brewer MRN: [id number] DOB: [birth year] Today's Date: 09/22/2023  History of Present Illness  Pt is a 63 y/o M admitted on 09/19/23 after presenting for evaluation of L 2nd toe discoloration & drainage. Vascular & podiatry consulted. Pt is s/p LLE angiogram on 09/20/23, s/p L posterior tibial angioplasty, amputation of L 2nd toe through proximal phalanx base on 09/21/23. PMH: DM, CVA, ERSD on HD  Clinical Impression  Pt seen for PT evaluation with pt agreeable, pt presents with some cognitive deficits but unsure if this is baseline or not. Pt requires assistance to secure strap of L post op shoe, but does attempt himself first. Pt ambulates without AD but PT provides pt with RW for increased balance & stability. Pt ambulates with RW & CGA with intermittent cuing re: L foot clearance vs foot drag. Pt negotiated 4 steps with R ascending rail & up to min assist. Recommend use of RW at home to reduce fall risk, HHPT f/u. Will continue to follow pt acutely to progress mobility as able.    If plan is discharge home, recommend the following: A little help with walking and/or transfers;A little help with bathing/dressing/bathroom;Assistance with cooking/housework;Assist for transportation;Help with stairs or ramp for entrance;Supervision due to cognitive status;Direct supervision/assist for financial management;Direct supervision/assist for medications management   Can travel by private vehicle        Equipment Recommendations Rolling walker (2 wheels)  Recommendations for Other Services       Functional Status Assessment Patient has had a recent decline in their functional status and demonstrates the ability to make significant improvements in function in a reasonable and predictable amount of time.     Precautions / Restrictions Precautions Precautions: Fall Restrictions Weight Bearing Restrictions Per Provider Order: Yes LLE Weight Bearing  Per Provider Order: Weight bearing as tolerated (in post op shoe)      Mobility  Bed Mobility               General bed mobility comments: not tested, pt received & left sitting in standard chair in room    Transfers Overall transfer level: Needs assistance Equipment used: None, Rolling walker (2 wheels) Transfers: Sit to/from Stand Sit to Stand: Supervision           General transfer comment: sit>stand from chair without AD & with RW, pushes to stand, needs cuing to reach back for sitting    Ambulation/Gait Ambulation/Gait assistance: Min assist, Contact guard assist Gait Distance (Feet): 7 Feet (+ 150 ft + 150 ft) Assistive device: None, Rolling walker (2 wheels) Gait Pattern/deviations: Decreased step length - left, Decreased dorsiflexion - left, Decreased stride length Gait velocity: decreased     General Gait Details: Pt ambulates 7 ft without AD with min assist, then provided with RW & pt ambulates with CGA. Pt with L foot drag at times, cuing for increased stepping & foot clearance with good return demo but eventually requires cuing again. Education to ambulate within base of AD with good return demo.  Stairs Stairs: Yes Stairs assistance: Contact guard assist, Min assist Stair Management: One rail Right, Step to pattern Number of Stairs: 4 (6) General stair comments: up to min assist to ascend stairs, CGA to descend, education/cuing re: compensatory pattern  Wheelchair Mobility     Tilt Bed    Modified Rankin (Stroke Patients Only)       Balance Overall balance assessment: Needs assistance Sitting-balance support: Feet supported Sitting balance-Leahy Scale: Good  Standing balance support: During functional activity, No upper extremity supported Standing balance-Leahy Scale: Fair                               Pertinent Vitals/Pain Pain Assessment Pain Assessment: No/denies pain    Home Living Family/patient expects to be  discharged to:: Private residence Living Arrangements: Parent Available Help at Discharge: Family Type of Home: House Home Access: Stairs to enter Entrance Stairs-Rails: Right Entrance Stairs-Number of Steps: 2   Home Layout: One level Home Equipment: Cane - quad;Shower seat      Prior Function               Mobility Comments: Ambulatory without AD, denies falls, ADLs Comments: Independent with bathing & dressing. Mother does cooking & cleaning & provides transportation to dialysis as pt is unable to drive 2/2 vision issues.     Extremity/Trunk Assessment   Upper Extremity Assessment Upper Extremity Assessment: Overall WFL for tasks assessed    Lower Extremity Assessment Lower Extremity Assessment: LLE deficits/detail LLE Deficits / Details: s/p L 2nd toe amputation, ace wrapped       Communication   Communication Communication: No apparent difficulties    Cognition Arousal: Alert Behavior During Therapy: Flat affect   PT - Cognitive impairments: Orientation, Awareness, Problem solving, Safety/Judgement, Initiation   Orientation impairments: Place, Time                   PT - Cognition Comments: extra processing time to respond to questions, oriented to city & hospital but unable to state name of hospital, oriented to month but unable to recall current year Following commands: Intact       Cueing Cueing Techniques: Verbal cues     General Comments General comments (skin integrity, edema, etc.): HR 97-98 bpm after gait    Exercises Other Exercises Other Exercises: Pt attempts to don LLE post op shoe, requires assistance to secure strap around ankle.   Assessment/Plan    PT Assessment Patient needs continued PT services  PT Problem List Decreased strength;Decreased activity tolerance;Decreased balance;Decreased mobility;Decreased knowledge of precautions;Decreased safety awareness;Decreased knowledge of use of DME;Decreased skin integrity        PT Treatment Interventions DME instruction;Balance training;Gait training;Neuromuscular re-education;Stair training;Functional mobility training;Therapeutic activities;Therapeutic exercise;Patient/family education;Cognitive remediation    PT Goals (Current goals can be found in the Care Plan section)  Acute Rehab PT Goals Patient Stated Goal: go home PT Goal Formulation: With patient Time For Goal Achievement: 10/06/23 Potential to Achieve Goals: Good    Frequency Min 2X/week     Co-evaluation               AM-PAC PT 6 Clicks Mobility  Outcome Measure Help needed turning from your back to your side while in a flat bed without using bedrails?: None Help needed moving from lying on your back to sitting on the side of a flat bed without using bedrails?: A Little Help needed moving to and from a bed to a chair (including a wheelchair)?: A Little Help needed standing up from a chair using your arms (e.g., wheelchair or bedside chair)?: A Little Help needed to walk in hospital room?: A Little Help needed climbing 3-5 steps with a railing? : A Little 6 Click Score: 19    End of Session Equipment Utilized During Treatment:  (LLE post op shoe) Activity Tolerance: Patient tolerated treatment well Patient left: in chair;with call bell/phone within  reach;with family/visitor present   PT Visit Diagnosis: Muscle weakness (generalized) (M62.81);Other abnormalities of gait and mobility (R26.89);Unsteadiness on feet (R26.81);Difficulty in walking, not elsewhere classified (R26.2)    Time: 8641-8588 and 8583-8575 PT Time Calculation (min) (ACUTE ONLY): 13 + 8 min, 21 min total   Charges:   PT Evaluation $PT Eval Low Complexity: 1 Low   PT General Charges $$ ACUTE PT VISIT: 1 Visit         [name], PT, DPT 09/22/23, 2:34 PM   [name] 09/22/2023, 2:32 PM

## 2023-09-22 NOTE — Progress Notes (Signed)
 D/C order noted. Contacted [facility] to be advised of pt's d/c today and that pt should resume care on Monday.   Randine Mungo Dialysis Navigator [phone]

## 2023-09-22 NOTE — Progress Notes (Signed)
 PT Cancellation Note  Patient Details Name: Edgar Brewer MRN: [id number] DOB: 09-27-1960   Cancelled Treatment:    Reason Eval/Treat Not Completed: Patient at procedure or test/unavailable Pt noted to be OTF at dialysis. Will f/u as able.  [name], PT, DPT 09/22/23, 8:38 AM   [name] 09/22/2023, 8:37 AM

## 2023-09-22 NOTE — Progress Notes (Signed)
 Pt with mother at bedside. Patient and mother expressed verbal understanding of discharge plan of care. Tele removed no PIV at discharge.  CCMD/Darrell.  RW at bedside.   All belongings with patient.   VSS alert oriented in good spirits.   Transport called for TOC/Main [facility].

## 2023-09-22 NOTE — Progress Notes (Signed)
 Received patient in bed to unit.  Alert and oriented.  Informed consent signed and in chart.   TX duration: 3 hours and 45 minutes  Patient had low BP during session but had no chest pain, dizziness or shortness of breath.  Dr. Melia and PA Jake informed.  Transported back to the room  Alert, without acute distress.  Hand-off given to patient's nurse.   Access used: Left Upper arm AV Fistula Access issues: none  Total UF removed: Medication(s) given: none   09/22/23 1220  Vitals  Temp 98.8 F (37.1 C)  Temp Source Oral  BP (!) 93/50  Pulse Rate 86  ECG Heart Rate 86  Resp (!) 23  Weight 97.4 kg  Type of Weight Post-Dialysis  Oxygen Therapy  SpO2 94 %  O2 Device Room Air  During Treatment Monitoring  Duration of HD Treatment -hour(s) 3.75 hour(s)  HD Safety Checks Performed Yes  Intra-Hemodialysis Comments Tx completed  Post Treatment  Dialyzer Clearance Clear  Liters Processed 90  Fluid Removed (mL) 600 mL  Tolerated HD Treatment Yes  Post-Hemodialysis Comments Patient SBP>90 and UF off a lot of the session.  Patient asymptomatic during those times of Low SBP  AVG/AVF Arterial Site Held (minutes) 7 minutes  AVG/AVF Venous Site Held (minutes) 7 minutes  Fistula / Graft Left Upper arm Arteriovenous fistula  Placement Date/Time: 05/17/20 2115   Placed prior to admission: Yes  Orientation: Left  Access Location: Upper arm  Access Type: Arteriovenous fistula  Status Deaccessed     Camellia Brasil LPN [facility]

## 2023-09-22 NOTE — Progress Notes (Signed)
  Subjective:  Patient ID: Edgar Brewer, male    DOB: 17-Nov-1960,  MRN: [id number]  Chief Complaint  Patient presents with   Toe Pain    DOS: 09/21/2023 Procedure: Amputation of left second toe through proximal phalanx base  63 y.o. male seen for post op check.  Patient seen in dialysis unit.  He is doing well he denies pain.  He discussed findings from surgery as well as follow-up plans  Review of Systems: Negative except as noted in the HPI. Denies N/V/F/Ch.   Objective:   Constitutional Well developed. Well nourished.  Vascular Foot warm and well perfused. Capillary refill normal to all digits.   No calf pain with palpation  Neurologic Normal speech. Oriented to person, place, and time. Epicritic sensation diminished to left foot  Dermatologic Dressing clean dry and intact to left foot  Orthopedic: Status post left second toe amputation proximal phalanx base   Radiographs: Status post amputation at the base of the proximal phalanx of the second digit.  Pathology: Pending  Micro: N/A  Assessment:   1. Gangrene of left foot (HCC)   2. Toe infection   Gangrene of left second toe status post amputation proximal phalanx base level  Plan:  Patient was evaluated and treated and all questions answered.  POD # 1 s/p amputation of left second toe through proximal phalanx base -Progressing as expected postoperatively, pain controlled.  Decent capillary bleeding was seen in the soft tissues at the time of amputation though no active bleeding.  Hopeful he will be able to heal this level of amputation -XR: Expected postoperative changes -WB Status: Weightbearing as tolerated in postop shoe -Sutures: Remain intact 2 to 3 weeks. -Medications/ABX: Recommend 5 days Augmentin  from the OR -Dressing: Remain clean dry and intact until follow-up next week - F/u Plan: Patient stable for discharge from my perspective he will follow-up next week Thursday in the office will have office call  to arrange.  Leave dressing clean dry and intact until then        [name], DPM [facility] / [facility]

## 2023-09-22 NOTE — Discharge Planning (Addendum)
 [facility] Patient Discharge Orders- [facility] CLINIC: [place]  Patient's name: Edgar Brewer Admit/DC Dates: 09/19/2023 - 09/22/2023  Discharge Diagnoses: PAD/ L toe gangrene. S/p L 2nd toe amputation per podiatry  Hyperkalemia. Concern for access recirculation. Needs OP evaluation.   Outpatient Dialysis Orders:  -Heparin : No change  -EDW:  No change   -Bath: No change   Anemia Aranesp : Given: --   Date of last dose/amount: 00   PRBC's Given: -- Date/# of units: -- ESA dose for discharge:   Recent Labs  Lab 09/21/23 0506 09/22/23 0501 09/22/23 0646 09/22/23 0705  HGB 12.1* 12.7*  --   --   K 5.9*  5.7*  --  6.5* 5.9*  CALCIUM  9.9  10.0  --  9.5 9.6  PHOS 6.4*  --   --   --   ALBUMIN 3.0*  --  3.1*  --     Access intervention/Change:  Please refer for fistulogram to evaluate for recirculation. Clinic manager at [facility] made aware of order.   Medications: -IV Antibiotics:  -Anticoagulation:  OTHER/APPTS/LABS     Completed by: [name] PA-C   D/C Meds to be reconciled by nurse after every discharge.    Reviewed by: MD:______ RN_______

## 2023-09-22 NOTE — Plan of Care (Signed)

## 2023-09-22 NOTE — Progress Notes (Signed)
 Edgar Brewer Progress Note   Subjective: Seen in [facility]. Getting ready to start dialysis. Low potassium bath ordered.  Had foot surgery yesterday - left 2nd toe amputation. No issues overnight. No cp, dyspnea.   Objective Vitals:   09/22/23 0327 09/22/23 0739 09/22/23 0808 09/22/23 0809  BP: 124/71 132/61 126/69   Pulse: 79 83 82   Resp: 16  (!) 22   Temp: 98.3 F (36.8 C) 98.7 F (37.1 C) 97.6 F (36.4 C)   TempSrc: Oral Oral    SpO2: 97% 94% 92%   Weight:    97.9 kg  Height:        Additional Objective Labs: Basic Metabolic Panel: Recent Labs  Lab 09/19/23 1612 09/20/23 0509 09/21/23 0506  NA 129* 135 136  136  K 5.7* 5.6* 5.9*  5.7*  CL 91* 98 98  97*  CO2 23 23 23  23   GLUCOSE 275* 161* 119*  128*  BUN 35* 41* 44*  44*  CREATININE 10.67* 11.50* 11.13*  11.22*  CALCIUM  9.8 9.9 9.9  10.0  PHOS  --   --  6.4*   CBC: Recent Labs  Lab 09/19/23 1612 09/20/23 0509 09/21/23 0506 09/22/23 0501  WBC 7.6 7.5 7.3 7.2  NEUTROABS 6.0  --   --   --   HGB 12.4* 11.5* 12.1* 12.7*  HCT 37.5* 34.4* 36.3* 38.4*  MCV 96.9 95.6 95.5 97.0  PLT 230 199 218 243   Blood Culture    Component Value Date/Time   SDES BLOOD SITE NOT SPECIFIED 09/19/2023 1612   SPECREQUEST  09/19/2023 1612    BOTTLES DRAWN AEROBIC ONLY Blood Culture results may not be optimal due to an inadequate volume of blood received in culture bottles   CULT  09/19/2023 1612    NO GROWTH 3 DAYS Performed at [facility], [address] [place], [place] [address]    REPTSTATUS PENDING 09/19/2023 1612     Physical Exam General: Alert, nad  Heart: RRR Lungs: Clear, normal wob Abdomen: non -tender Extremities: no LE edema  Dialysis Access: L AVF +bruit   Medications:  anticoagulant sodium citrate       amoxicillin -clavulanate  1 tablet Oral Q24H   aspirin  EC  81 mg Oral Daily   Chlorhexidine  Gluconate Cloth  6 each Topical Q0600   cinacalcet   60 mg Oral Q T,Th,Sat-1800    clopidogrel   75 mg Oral Q breakfast   gabapentin   100 mg Oral TID   insulin  aspart  0-5 Units Subcutaneous QHS   insulin  aspart  0-6 Units Subcutaneous TID WC   insulin  glargine-yfgn  20 Units Subcutaneous Daily   pantoprazole   40 mg Oral Daily   rosuvastatin   10 mg Oral Daily   sevelamer  carbonate  1,600 mg Oral TID WC   sodium chloride  flush  3 mL Intravenous Q12H   sodium chloride  flush  3 mL Intravenous Q12H   Dialysis Orders:  [facility] M[facility]  4:00 BFR 400 EDW 97.4 kg 2K/2.5Ca AVF Hep 2600 U  -No ESA  -Calcitriol 1.5 q HD    Assessment/Plan: PAD/ L toe gangrene. S/p LE angiogram with L posterior tibial angioplasty per VVS. Cont ASA/Plavix .  S/p L 2nd toe amputation 8/28. Per podiatry  ESRD.  HD MWF. Continue on schedule. Next HD Friday Hyperkalemia. Using low K bath with HD today  Access. AVF. No issues reported  Hypertension. BP acceptable  Volume. Appears euvolemic. UF to EDW.  Anemia. Hgb at goal. No ESA needs.  Metabolic bone disease.  Cont calcitriol; sevelamer  binder  T2DM. Insulin  per primary   Edgar Ronnald Acosta PA-C [facility] 09/22/2023,8:20 AM

## 2023-09-23 ENCOUNTER — Ambulatory Visit: Payer: Self-pay | Admitting: Family Medicine

## 2023-09-23 ENCOUNTER — Telehealth (HOSPITAL_COMMUNITY): Payer: Self-pay | Admitting: Nephrology

## 2023-09-23 ENCOUNTER — Encounter (HOSPITAL_COMMUNITY): Payer: Self-pay | Admitting: Vascular Surgery

## 2023-09-23 LAB — HIV-1/HIV-2 QUALITATIVE RNA
Final Interpretation: NEGATIVE
HIV-1 RNA, Qualitative: NONREACTIVE
HIV-2 RNA, Qualitative: NONREACTIVE

## 2023-09-23 LAB — HIV-1/2 AB - DIFFERENTIATION
HIV 1 Ab: NONREACTIVE
HIV 2 Ab: NONREACTIVE
Note: NEGATIVE

## 2023-09-23 NOTE — Telephone Encounter (Signed)
 Transition of care contact from inpatient facility  Date of Discharge: 09/22/2023 Date of Contact:09/23/2023 - attempt #1 Method of contact: Phone  Attempted to contact patient to discuss transition of care from inpatient admission. Patient did not answer the phone. There was no ability to leave a message.  Izetta Boehringer, PA-C [employer] Pager [phone]

## 2023-09-24 LAB — CULTURE, BLOOD (ROUTINE X 2)
Culture: NO GROWTH
Culture: NO GROWTH
Special Requests: ADEQUATE

## 2023-09-26 LAB — SURGICAL PATHOLOGY

## 2023-09-27 ENCOUNTER — Telehealth: Payer: Self-pay | Admitting: Family Medicine

## 2023-09-27 NOTE — Telephone Encounter (Signed)
 Please review message from [facility].   Copied from CRM [id number]. Topic: General - Other >> Sep 27, 2023  2:25 PM Henretta I wrote: Reason for CRM: Edgar Brew[facility] with [facility]. She was calling to notify that she did evaluation today and patient feels like he's doing well so no further visits. Patient is declining PT and OT.

## 2023-09-27 NOTE — Telephone Encounter (Signed)
 Noted thank you

## 2023-09-28 ENCOUNTER — Encounter: Payer: Self-pay | Admitting: Podiatry

## 2023-09-28 ENCOUNTER — Ambulatory Visit (INDEPENDENT_AMBULATORY_CARE_PROVIDER_SITE_OTHER): Payer: Medicare (Managed Care) | Admitting: Podiatry

## 2023-09-28 DIAGNOSIS — Z9889 Other specified postprocedural states: Secondary | ICD-10-CM

## 2023-09-28 DIAGNOSIS — I96 Gangrene, not elsewhere classified: Secondary | ICD-10-CM | POA: Diagnosis not present

## 2023-09-28 DIAGNOSIS — I739 Peripheral vascular disease, unspecified: Secondary | ICD-10-CM

## 2023-09-28 NOTE — Progress Notes (Signed)
  Subjective:  Patient ID: Edgar Brewer, male    DOB: 02-17-1960,  MRN: [id number]  Chief Complaint  Patient presents with   Wound Check    Amputation of left second toe through proximal phalanx base. 0 pain. IDDM A1C 7.0. Wearing surgical shoe.    DOS: 09/21/2023 Procedure: Amputation of left second toe through proximal phalanx base  63 y.o. male seen for post op check.   1 week s/p above procedure. Walking in post op shoe. Dressings intact since leaving hospital.  Has noted some burning sensation near the amputation site  Review of Systems: Negative except as noted in the HPI. Denies N/V/F/Ch.   Objective:   Constitutional Well developed. Well nourished.  Vascular Foot warm and well perfused. Capillary refill normal to all digits.   No calf pain with palpation  Neurologic Normal speech. Oriented to person, place, and time. Epicritic sensation diminished to left foot  Dermatologic L 2nd toe amputation site overall is relatively healthy with superficial eschar difficult to assess if there is a true dehiscence or just dried blood eschar overlying.  Stitches are still intact.  No significant gapping or deep dehiscence noted  Orthopedic: Status post left second toe amputation proximal phalanx base   Radiographs: Status post amputation at the base of the proximal phalanx of the second digit.  Pathology:  A.   TOE, LEFT SECOND, AMPUTATION:  -Gangrenous necrosis (2.6 cm gangrenous ulcer).  -Negative for acute osteomyelitis.  -Soft tissue and skin proximal resection margin grossly viable.  -Bone resection margin viable and negative for acute osteomyelitis.   Micro: N/A  Assessment:   1. Gangrene of toe of left foot (HCC)   2. PAD (peripheral artery disease) (HCC)   3. Post-operative state    Gangrene of left second toe status post amputation proximal phalanx base level  Plan:  Patient was evaluated and treated and all questions answered.  1 week s/p amputation of left second  toe through proximal phalanx base -Progressing as expected postoperatively, amp site overall appears relatively healthy possible superficial dehiscence though difficult to fully assess given mild dried blood eschar overlying. -XR: Expected postoperative changes -WB Status: Weightbearing as tolerated in postop shoe -Sutures: Remain intact 1 -2 weeks. -Medications/ABX: s/p Augmentin , no further abx indicated -Dressing: Replaced new Xeroform and dry gauze dsg today, Remain clean dry and intact until follow-up next week - F/u Plan:  F/u in 1 week        [name], DPM [facility] / [facility]

## 2023-10-02 ENCOUNTER — Telehealth: Payer: Self-pay | Admitting: *Deleted

## 2023-10-02 NOTE — Telephone Encounter (Signed)
 Copied from CRM [id number]. Topic: Medical Record Request - Payor/Billing Request >> Sep 29, 2023  5:29 PM [place] J wrote: Reason for CRM: Representative named Mardeen, calling from [employer], is wanting to verify the patient's medication list. She will call back on Monday once someone clinical is able to verify medication list.

## 2023-10-03 ENCOUNTER — Other Ambulatory Visit: Payer: Self-pay | Admitting: Family Medicine

## 2023-10-03 NOTE — Telephone Encounter (Signed)
 Team please work with [employer]

## 2023-10-04 NOTE — Telephone Encounter (Signed)
 No call back number for [facility] listed, will await next phone call on Monday as listed below.

## 2023-10-05 ENCOUNTER — Ambulatory Visit (INDEPENDENT_AMBULATORY_CARE_PROVIDER_SITE_OTHER): Payer: Medicare (Managed Care) | Admitting: Podiatry

## 2023-10-05 ENCOUNTER — Encounter: Payer: Self-pay | Admitting: Podiatry

## 2023-10-05 ENCOUNTER — Other Ambulatory Visit: Payer: Self-pay

## 2023-10-05 ENCOUNTER — Encounter (HOSPITAL_COMMUNITY): Payer: Self-pay

## 2023-10-05 VITALS — Ht 68.0 in | Wt 214.0 lb

## 2023-10-05 DIAGNOSIS — I96 Gangrene, not elsewhere classified: Secondary | ICD-10-CM

## 2023-10-05 DIAGNOSIS — I739 Peripheral vascular disease, unspecified: Secondary | ICD-10-CM

## 2023-10-05 DIAGNOSIS — Z9889 Other specified postprocedural states: Secondary | ICD-10-CM

## 2023-10-05 DIAGNOSIS — E119 Type 2 diabetes mellitus without complications: Secondary | ICD-10-CM

## 2023-10-05 MED ORDER — INSULIN GLARGINE-YFGN 100 UNIT/ML ~~LOC~~ SOPN: 50.0000 [IU] | PEN_INJECTOR | Freq: Every morning | SUBCUTANEOUS | 3 refills | Status: DC

## 2023-10-05 NOTE — Progress Notes (Signed)
  Subjective:  Patient ID: Edgar Brewer, male    DOB: 1960/03/17,  MRN: [id number]  Chief Complaint  Patient presents with   Toe Pain    Gangrene of toe of left foot (HCC)    DOS: 09/21/2023 Procedure: Amputation of left second toe through proximal phalanx base  63 y.o. male seen for post op check.   2 week s/p above procedure. Walking in post op shoe.  He states the dressing was not changed however there is a new dressing applied on his foot that I did not apply at last visit.  Denies any nausea vomiting fever chills denies significant drainage.  Review of Systems: Negative except as noted in the HPI. Denies N/V/F/Ch.   Objective:   Constitutional Well developed. Well nourished.  Vascular Foot warm and well perfused. Capillary refill normal to all digits.   No calf pain with palpation  Neurologic Normal speech. Oriented to person, place, and time. Epicritic sensation diminished to left foot  Dermatologic L 2nd toe amputation site has maceration and superficial dehiscence to subcutaneous fat tissue level no evidence of infection.   Orthopedic: Status post left second toe amputation proximal phalanx base   Radiographs: Status post amputation at the base of the proximal phalanx of the second digit.  Pathology:  A.   TOE, LEFT SECOND, AMPUTATION:  -Gangrenous necrosis (2.6 cm gangrenous ulcer).  -Negative for acute osteomyelitis.  -Soft tissue and skin proximal resection margin grossly viable.  -Bone resection margin viable and negative for acute osteomyelitis.   Micro: N/A  Assessment:   1. Gangrene of toe of left foot (HCC)   2. PAD (peripheral artery disease) (HCC)   3. Post-operative state     Gangrene of left second toe status post amputation proximal phalanx base level  Plan:  Patient was evaluated and treated and all questions answered.  2 week s/p amputation of left second toe through proximal phalanx base - Superficial dehiscence of subcutaneous fat tissue  level at the amputation site, likely related to small vessel disease contributing to delayed healing and necrosis at the incision line. -Debrided the amputation site of any necrotic or fibrotic tissues present in the wound bed with tissue nipper to subcutaneous fat tissue level does not appear to probe deep and still partially coapted -Recommend we proceed with every other day Betadine wet-to-dry dressing -XR: Expected postoperative changes -WB Status: Weightbearing as tolerated in postop shoe -Sutures: Removed in total at this visit -Medications/ABX: s/p Augmentin , no further abx indicated -Dressing: Replaced Betadine wet-to-dry dressing with some Steri-Strips.  Change every other day until next appointment - F/u Plan:  F/u in 2 week        [name], DPM [facility] / [facility]

## 2023-10-16 ENCOUNTER — Other Ambulatory Visit: Payer: Self-pay

## 2023-10-16 DIAGNOSIS — I739 Peripheral vascular disease, unspecified: Secondary | ICD-10-CM

## 2023-10-17 ENCOUNTER — Ambulatory Visit (INDEPENDENT_AMBULATORY_CARE_PROVIDER_SITE_OTHER): Payer: Medicare (Managed Care)

## 2023-10-17 ENCOUNTER — Encounter (HOSPITAL_COMMUNITY): Payer: Self-pay

## 2023-10-17 ENCOUNTER — Ambulatory Visit (INDEPENDENT_AMBULATORY_CARE_PROVIDER_SITE_OTHER): Payer: Medicare (Managed Care) | Admitting: Podiatry

## 2023-10-17 DIAGNOSIS — I739 Peripheral vascular disease, unspecified: Secondary | ICD-10-CM

## 2023-10-17 DIAGNOSIS — M86272 Subacute osteomyelitis, left ankle and foot: Secondary | ICD-10-CM

## 2023-10-17 DIAGNOSIS — L97524 Non-pressure chronic ulcer of other part of left foot with necrosis of bone: Secondary | ICD-10-CM | POA: Diagnosis not present

## 2023-10-17 DIAGNOSIS — L97522 Non-pressure chronic ulcer of other part of left foot with fat layer exposed: Secondary | ICD-10-CM | POA: Diagnosis not present

## 2023-10-18 ENCOUNTER — Telehealth: Payer: Self-pay

## 2023-10-18 NOTE — Telephone Encounter (Signed)
 Patient motherJamorion Brewer) called concerned about patients condition after dialysis. She states his leg was red and warm to the touch, patient has limited mobility today and seemed lethargic following dialysis treatment. Patient is scheduled for an upcoming surgery on 10/26/23. Advised to monitor patient's condition and seek medical attention at the local emergency department if symptoms progress.

## 2023-10-18 NOTE — Progress Notes (Signed)
 Subjective:  Patient ID: Edgar Brewer, male    DOB: 1960-09-12,  MRN: [id number]  Chief Complaint  Patient presents with   Wound Check    2 wk rtn Amputation of left second toe through proximal phalanx base. IDDM A1C 7.0. 5 pain sharp shooting pain. Iodine  dressng. Wearing surgical shoe. Right great toe distal dorsal wound.    DOS: 09/21/2023 Procedure: Amputation of left second toe through proximal phalanx base  63 y.o. male seen for post op check.   4 week s/p above procedure. Walking in post op shoe.   Still having drainage and pain at the site of amputation.  Doing Betadine dressing.  Thinks the wound is getting worse.  Review of Systems: Negative except as noted in the HPI. Denies N/V/F/Ch.   Objective:   Constitutional Well developed. Well nourished.  Vascular Foot warm and well perfused. Capillary refill normal to all digits.   No calf pain with palpation  Neurologic Normal speech. Oriented to person, place, and time. Epicritic sensation diminished to left foot  Dermatologic L 2nd toe amputation site with mild malodor and concern for fibrotic tissue in the wound bed.  There is maceration of the periwound tissue.  Mild erythema and edema of the area.  Wound does probe down to bone.     Orthopedic: Status post left second toe amputation proximal phalanx base   Radiographs: Status post amputation at the base of the proximal phalanx of the second digit.  10/17/2023 XR 3 views AP lateral and oblique of the left foot: Attention directed to the remnant proximal phalanx base on the second toe there is concern for erosions of the distal margin concerning for osteomyelitis.  No obvious evidence of erosion of the second metatarsal head.  Significant vascular calcification  Pathology:  A.   TOE, LEFT SECOND, AMPUTATION:  -Gangrenous necrosis (2.6 cm gangrenous ulcer).  -Negative for acute osteomyelitis.  -Soft tissue and skin proximal resection margin grossly viable.  -Bone  resection margin viable and negative for acute osteomyelitis.   Micro: N/A  Assessment:   1. Subacute osteomyelitis of left foot (HCC)   2. Ulcer of left foot with necrosis of bone (HCC)   3. PAD (peripheral artery disease)     Gangrene of left second toe status post amputation proximal phalanx base level  Now with concern for residual osteomyelitis of 2nd proximal phalanx base and wound dehiscence 2/2 PAD, soft tissue infection  Plan:  Patient was evaluated and treated and all questions answered.  4 week s/p amputation of left second toe through proximal phalanx base - At this point there is concern for residual osteomyelitis of the proximal phalanx base of the second toe.  No evidence of osteomyelitis in the second metatarsal head on x-ray -Discussed treatment options with the patient I do recommend returning to the operating room for further debridement and amputation of the proximal phalanx base with debridement of the soft tissue surrounding.  May consider bone biopsy as well - Discussed various ways in which we could achieve this -outpatient surgery versus inpatient admission.  As it does not appear to be a rapidly spreading infection we can proceed with outpatient surgery we will begin surgical planning.  Discussed risk benefits terms of possible complications.  May not be able to close the wound depending on amount of tissue loss and concern for residual infection.  -Debrided the amputation site of any necrotic or fibrotic tissues present in the wound bed with tissue nipper and the wound probes to  bone dressing -XR: Evidence of erosions in the remnant proximal phalanx base concerning for osteomyelitis second ray -WB Status: Weightbearing as tolerated in postop shoe -Sutures: Previously removed -Medications/ABX: Holding antibiotics for IntraOp cultures -Dressing: Replaced Betadine wet-to-dry dressing  Change every other day until OR        [name], DPM [facility] / [facility]

## 2023-10-19 ENCOUNTER — Emergency Department (HOSPITAL_COMMUNITY): Payer: Medicare (Managed Care)

## 2023-10-19 ENCOUNTER — Ambulatory Visit (HOSPITAL_COMMUNITY)
Admission: RE | Admit: 2023-10-19 | Payer: Medicare (Managed Care) | Source: Home / Self Care | Admitting: Vascular Surgery

## 2023-10-19 ENCOUNTER — Other Ambulatory Visit: Payer: Self-pay

## 2023-10-19 ENCOUNTER — Encounter (HOSPITAL_COMMUNITY)
Admission: EM | Disposition: A | Discharge status: Skilled Nursing Facility | Payer: Self-pay | Source: Home / Self Care | Attending: Internal Medicine

## 2023-10-19 ENCOUNTER — Ambulatory Visit: Payer: PRIVATE HEALTH INSURANCE | Admitting: Podiatry

## 2023-10-19 ENCOUNTER — Telehealth: Payer: Self-pay | Admitting: Podiatry

## 2023-10-19 ENCOUNTER — Inpatient Hospital Stay (HOSPITAL_COMMUNITY)
Admission: EM | Admit: 2023-10-19 | Discharge: 2023-11-07 | DRG: 856 | Disposition: A | Discharge status: Skilled Nursing Facility | Payer: Medicare (Managed Care) | Attending: Internal Medicine | Admitting: Internal Medicine

## 2023-10-19 ENCOUNTER — Encounter (HOSPITAL_COMMUNITY): Payer: Self-pay | Admitting: Hospitalist

## 2023-10-19 DIAGNOSIS — E1152 Type 2 diabetes mellitus with diabetic peripheral angiopathy with gangrene: Secondary | ICD-10-CM | POA: Diagnosis present

## 2023-10-19 DIAGNOSIS — Z801 Family history of malignant neoplasm of trachea, bronchus and lung: Secondary | ICD-10-CM

## 2023-10-19 DIAGNOSIS — R931 Abnormal findings on diagnostic imaging of heart and coronary circulation: Secondary | ICD-10-CM

## 2023-10-19 DIAGNOSIS — Z6832 Body mass index (BMI) 32.0-32.9, adult: Secondary | ICD-10-CM | POA: Diagnosis not present

## 2023-10-19 DIAGNOSIS — R0902 Hypoxemia: Secondary | ICD-10-CM | POA: Diagnosis present

## 2023-10-19 DIAGNOSIS — I441 Atrioventricular block, second degree: Secondary | ICD-10-CM | POA: Diagnosis not present

## 2023-10-19 DIAGNOSIS — Z885 Allergy status to narcotic agent status: Secondary | ICD-10-CM

## 2023-10-19 DIAGNOSIS — I739 Peripheral vascular disease, unspecified: Secondary | ICD-10-CM | POA: Diagnosis not present

## 2023-10-19 DIAGNOSIS — M86072 Acute hematogenous osteomyelitis, left ankle and foot: Secondary | ICD-10-CM | POA: Diagnosis not present

## 2023-10-19 DIAGNOSIS — E118 Type 2 diabetes mellitus with unspecified complications: Secondary | ICD-10-CM | POA: Diagnosis not present

## 2023-10-19 DIAGNOSIS — N186 End stage renal disease: Secondary | ICD-10-CM | POA: Diagnosis present

## 2023-10-19 DIAGNOSIS — E11319 Type 2 diabetes mellitus with unspecified diabetic retinopathy without macular edema: Secondary | ICD-10-CM | POA: Diagnosis present

## 2023-10-19 DIAGNOSIS — Z992 Dependence on renal dialysis: Secondary | ICD-10-CM

## 2023-10-19 DIAGNOSIS — E1122 Type 2 diabetes mellitus with diabetic chronic kidney disease: Secondary | ICD-10-CM | POA: Diagnosis present

## 2023-10-19 DIAGNOSIS — E875 Hyperkalemia: Secondary | ICD-10-CM | POA: Diagnosis present

## 2023-10-19 DIAGNOSIS — E872 Acidosis, unspecified: Secondary | ICD-10-CM | POA: Diagnosis present

## 2023-10-19 DIAGNOSIS — T8141XA Infection following a procedure, superficial incisional surgical site, initial encounter: Secondary | ICD-10-CM | POA: Diagnosis present

## 2023-10-19 DIAGNOSIS — M869 Osteomyelitis, unspecified: Secondary | ICD-10-CM | POA: Diagnosis present

## 2023-10-19 DIAGNOSIS — Z8673 Personal history of transient ischemic attack (TIA), and cerebral infarction without residual deficits: Secondary | ICD-10-CM

## 2023-10-19 DIAGNOSIS — I480 Paroxysmal atrial fibrillation: Secondary | ICD-10-CM | POA: Diagnosis present

## 2023-10-19 DIAGNOSIS — I96 Gangrene, not elsewhere classified: Secondary | ICD-10-CM | POA: Diagnosis not present

## 2023-10-19 DIAGNOSIS — I330 Acute and subacute infective endocarditis: Secondary | ICD-10-CM

## 2023-10-19 DIAGNOSIS — M7989 Other specified soft tissue disorders: Secondary | ICD-10-CM | POA: Diagnosis not present

## 2023-10-19 DIAGNOSIS — T8142XD Infection following a procedure, deep incisional surgical site, subsequent encounter: Secondary | ICD-10-CM | POA: Diagnosis not present

## 2023-10-19 DIAGNOSIS — Z23 Encounter for immunization: Secondary | ICD-10-CM | POA: Diagnosis present

## 2023-10-19 DIAGNOSIS — Z1152 Encounter for screening for COVID-19: Secondary | ICD-10-CM | POA: Diagnosis not present

## 2023-10-19 DIAGNOSIS — E1169 Type 2 diabetes mellitus with other specified complication: Secondary | ICD-10-CM | POA: Diagnosis present

## 2023-10-19 DIAGNOSIS — E871 Hypo-osmolality and hyponatremia: Secondary | ICD-10-CM | POA: Diagnosis present

## 2023-10-19 DIAGNOSIS — M86172 Other acute osteomyelitis, left ankle and foot: Secondary | ICD-10-CM | POA: Diagnosis not present

## 2023-10-19 DIAGNOSIS — B9689 Other specified bacterial agents as the cause of diseases classified elsewhere: Secondary | ICD-10-CM | POA: Diagnosis not present

## 2023-10-19 DIAGNOSIS — Z8249 Family history of ischemic heart disease and other diseases of the circulatory system: Secondary | ICD-10-CM

## 2023-10-19 DIAGNOSIS — N2581 Secondary hyperparathyroidism of renal origin: Secondary | ICD-10-CM | POA: Diagnosis present

## 2023-10-19 DIAGNOSIS — Z87891 Personal history of nicotine dependence: Secondary | ICD-10-CM

## 2023-10-19 DIAGNOSIS — Z9049 Acquired absence of other specified parts of digestive tract: Secondary | ICD-10-CM

## 2023-10-19 DIAGNOSIS — E1165 Type 2 diabetes mellitus with hyperglycemia: Secondary | ICD-10-CM | POA: Diagnosis present

## 2023-10-19 DIAGNOSIS — I340 Nonrheumatic mitral (valve) insufficiency: Secondary | ICD-10-CM | POA: Diagnosis present

## 2023-10-19 DIAGNOSIS — I272 Pulmonary hypertension, unspecified: Secondary | ICD-10-CM | POA: Diagnosis present

## 2023-10-19 DIAGNOSIS — R001 Bradycardia, unspecified: Secondary | ICD-10-CM | POA: Diagnosis not present

## 2023-10-19 DIAGNOSIS — A419 Sepsis, unspecified organism: Principal | ICD-10-CM | POA: Diagnosis present

## 2023-10-19 DIAGNOSIS — I5032 Chronic diastolic (congestive) heart failure: Secondary | ICD-10-CM | POA: Diagnosis not present

## 2023-10-19 DIAGNOSIS — Z8261 Family history of arthritis: Secondary | ICD-10-CM

## 2023-10-19 DIAGNOSIS — E66811 Obesity, class 1: Secondary | ICD-10-CM | POA: Diagnosis present

## 2023-10-19 DIAGNOSIS — Z881 Allergy status to other antibiotic agents status: Secondary | ICD-10-CM

## 2023-10-19 DIAGNOSIS — I953 Hypotension of hemodialysis: Secondary | ICD-10-CM | POA: Diagnosis not present

## 2023-10-19 DIAGNOSIS — I110 Hypertensive heart disease with heart failure: Secondary | ICD-10-CM | POA: Diagnosis not present

## 2023-10-19 DIAGNOSIS — T8144XA Sepsis following a procedure, initial encounter: Secondary | ICD-10-CM | POA: Diagnosis present

## 2023-10-19 DIAGNOSIS — I5042 Chronic combined systolic (congestive) and diastolic (congestive) heart failure: Secondary | ICD-10-CM | POA: Diagnosis present

## 2023-10-19 DIAGNOSIS — M1990 Unspecified osteoarthritis, unspecified site: Secondary | ICD-10-CM | POA: Diagnosis present

## 2023-10-19 DIAGNOSIS — Z822 Family history of deafness and hearing loss: Secondary | ICD-10-CM

## 2023-10-19 DIAGNOSIS — I132 Hypertensive heart and chronic kidney disease with heart failure and with stage 5 chronic kidney disease, or end stage renal disease: Secondary | ICD-10-CM | POA: Diagnosis present

## 2023-10-19 DIAGNOSIS — Z794 Long term (current) use of insulin: Secondary | ICD-10-CM

## 2023-10-19 DIAGNOSIS — G629 Polyneuropathy, unspecified: Secondary | ICD-10-CM | POA: Diagnosis not present

## 2023-10-19 DIAGNOSIS — M79672 Pain in left foot: Secondary | ICD-10-CM

## 2023-10-19 DIAGNOSIS — R4182 Altered mental status, unspecified: Secondary | ICD-10-CM | POA: Diagnosis present

## 2023-10-19 DIAGNOSIS — E1140 Type 2 diabetes mellitus with diabetic neuropathy, unspecified: Secondary | ICD-10-CM | POA: Diagnosis present

## 2023-10-19 DIAGNOSIS — I059 Rheumatic mitral valve disease, unspecified: Secondary | ICD-10-CM | POA: Diagnosis not present

## 2023-10-19 DIAGNOSIS — I350 Nonrheumatic aortic (valve) stenosis: Secondary | ICD-10-CM | POA: Diagnosis not present

## 2023-10-19 DIAGNOSIS — Z89432 Acquired absence of left foot: Secondary | ICD-10-CM | POA: Diagnosis not present

## 2023-10-19 DIAGNOSIS — E785 Hyperlipidemia, unspecified: Secondary | ICD-10-CM | POA: Diagnosis present

## 2023-10-19 DIAGNOSIS — K219 Gastro-esophageal reflux disease without esophagitis: Secondary | ICD-10-CM | POA: Diagnosis present

## 2023-10-19 DIAGNOSIS — Z8042 Family history of malignant neoplasm of prostate: Secondary | ICD-10-CM

## 2023-10-19 DIAGNOSIS — R9431 Abnormal electrocardiogram [ECG] [EKG]: Secondary | ICD-10-CM | POA: Diagnosis not present

## 2023-10-19 DIAGNOSIS — Z7982 Long term (current) use of aspirin: Secondary | ICD-10-CM

## 2023-10-19 DIAGNOSIS — D631 Anemia in chronic kidney disease: Secondary | ICD-10-CM | POA: Diagnosis present

## 2023-10-19 DIAGNOSIS — Z79899 Other long term (current) drug therapy: Secondary | ICD-10-CM

## 2023-10-19 DIAGNOSIS — Z83438 Family history of other disorder of lipoprotein metabolism and other lipidemia: Secondary | ICD-10-CM

## 2023-10-19 DIAGNOSIS — K5900 Constipation, unspecified: Secondary | ICD-10-CM | POA: Diagnosis not present

## 2023-10-19 LAB — CBC
HCT: 32.7 % — ABNORMAL LOW (ref 39.0–52.0)
Hemoglobin: 10.4 g/dL — ABNORMAL LOW (ref 13.0–17.0)
MCH: 31.4 pg (ref 26.0–34.0)
MCHC: 31.8 g/dL (ref 30.0–36.0)
MCV: 98.8 fL (ref 80.0–100.0)
Platelets: 199 K/uL (ref 150–400)
RBC: 3.31 MIL/uL — ABNORMAL LOW (ref 4.22–5.81)
RDW: 13.4 % (ref 11.5–15.5)
WBC: 7.9 K/uL (ref 4.0–10.5)
nRBC: 0 % (ref 0.0–0.2)

## 2023-10-19 LAB — BRAIN NATRIURETIC PEPTIDE: B Natriuretic Peptide: 387.7 pg/mL — ABNORMAL HIGH (ref 0.0–100.0)

## 2023-10-19 LAB — CBC WITH DIFFERENTIAL/PLATELET
Abs Immature Granulocytes: 0.06 K/uL (ref 0.00–0.07)
Basophils Absolute: 0 K/uL (ref 0.0–0.1)
Basophils Relative: 0 %
Eosinophils Absolute: 0 K/uL (ref 0.0–0.5)
Eosinophils Relative: 0 %
HCT: 34.2 % — ABNORMAL LOW (ref 39.0–52.0)
Hemoglobin: 11.1 g/dL — ABNORMAL LOW (ref 13.0–17.0)
Immature Granulocytes: 1 %
Lymphocytes Relative: 4 %
Lymphs Abs: 0.4 K/uL — ABNORMAL LOW (ref 0.7–4.0)
MCH: 31.7 pg (ref 26.0–34.0)
MCHC: 32.5 g/dL (ref 30.0–36.0)
MCV: 97.7 fL (ref 80.0–100.0)
Monocytes Absolute: 0.7 K/uL (ref 0.1–1.0)
Monocytes Relative: 8 %
Neutro Abs: 7.5 K/uL (ref 1.7–7.7)
Neutrophils Relative %: 87 %
Platelets: 208 K/uL (ref 150–400)
RBC: 3.5 MIL/uL — ABNORMAL LOW (ref 4.22–5.81)
RDW: 13.5 % (ref 11.5–15.5)
WBC: 8.6 K/uL (ref 4.0–10.5)
nRBC: 0 % (ref 0.0–0.2)

## 2023-10-19 LAB — COMPREHENSIVE METABOLIC PANEL WITH GFR
ALT: 19 U/L (ref 0–44)
AST: 19 U/L (ref 15–41)
Albumin: 3 g/dL — ABNORMAL LOW (ref 3.5–5.0)
Alkaline Phosphatase: 255 U/L — ABNORMAL HIGH (ref 38–126)
Anion gap: 17 — ABNORMAL HIGH (ref 5–15)
BUN: 27 mg/dL — ABNORMAL HIGH (ref 8–23)
CO2: 27 mmol/L (ref 22–32)
Calcium: 9.8 mg/dL (ref 8.9–10.3)
Chloride: 92 mmol/L — ABNORMAL LOW (ref 98–111)
Creatinine, Ser: 8.73 mg/dL — ABNORMAL HIGH (ref 0.61–1.24)
GFR, Estimated: 6 mL/min — ABNORMAL LOW (ref >60)
Glucose, Bld: 102 mg/dL — ABNORMAL HIGH (ref 70–99)
Potassium: 5.2 mmol/L — ABNORMAL HIGH (ref 3.5–5.1)
Sodium: 136 mmol/L (ref 135–145)
Total Bilirubin: 1.1 mg/dL (ref 0.0–1.2)
Total Protein: 7.1 g/dL (ref 6.5–8.1)

## 2023-10-19 LAB — RESP PANEL BY RT-PCR (RSV, FLU A&B, COVID)  RVPGX2
Influenza A by PCR: NEGATIVE
Influenza B by PCR: NEGATIVE
Resp Syncytial Virus by PCR: NEGATIVE
SARS Coronavirus 2 by RT PCR: NEGATIVE

## 2023-10-19 LAB — GLUCOSE, CAPILLARY
Glucose-Capillary: 168 mg/dL — ABNORMAL HIGH (ref 70–99)
Glucose-Capillary: 234 mg/dL — ABNORMAL HIGH (ref 70–99)

## 2023-10-19 LAB — CREATININE, SERUM
Creatinine, Ser: 9.36 mg/dL — ABNORMAL HIGH (ref 0.61–1.24)
GFR, Estimated: 6 mL/min — ABNORMAL LOW (ref >60)

## 2023-10-19 LAB — PROTIME-INR
INR: 1.1 (ref 0.8–1.2)
Prothrombin Time: 15.3 s — ABNORMAL HIGH (ref 11.4–15.2)

## 2023-10-19 LAB — I-STAT CG4 LACTIC ACID, ED
Lactic Acid, Venous: 1.1 mmol/L (ref 0.5–1.9)
Lactic Acid, Venous: 0.8 mmol/L (ref 0.5–1.9)
Lactic Acid, Venous: 5.6 mmol/L (ref 0.5–1.9)

## 2023-10-19 LAB — LIPASE, BLOOD: Lipase: 14 U/L (ref 11–51)

## 2023-10-19 LAB — TROPONIN I (HIGH SENSITIVITY)
Troponin I (High Sensitivity): 24 ng/L — ABNORMAL HIGH (ref <18)
Troponin I (High Sensitivity): 25 ng/L — ABNORMAL HIGH (ref <18)

## 2023-10-19 LAB — HEMOGLOBIN A1C
Hgb A1c MFr Bld: 6.3 % — ABNORMAL HIGH (ref 4.8–5.6)
Mean Plasma Glucose: 134.11 mg/dL

## 2023-10-19 LAB — HEPATITIS B SURFACE ANTIGEN: Hepatitis B Surface Ag: NONREACTIVE

## 2023-10-19 SURGERY — A/V SHUNT INTERVENTION
Anesthesia: LOCAL | Site: Arm Upper | Laterality: Left

## 2023-10-19 MED ORDER — LACTATED RINGERS IV SOLN: INTRAVENOUS | Status: DC

## 2023-10-19 MED ORDER — SODIUM CHLORIDE 0.9 % IV SOLN
1.0000 g | INTRAVENOUS | Status: DC
  Administered 2023-10-21 – 2023-10-26 (×6): 1 g via INTRAVENOUS
  Filled 2023-10-19 (×8): qty 10

## 2023-10-19 MED ORDER — ENOXAPARIN SODIUM 30 MG/0.3ML IJ SOSY
30.0000 mg | PREFILLED_SYRINGE | INTRAMUSCULAR | Status: DC
  Administered 2023-10-19 – 2023-10-22 (×4): 30 mg via SUBCUTANEOUS
  Filled 2023-10-19 (×4): qty 0.3

## 2023-10-19 MED ORDER — ACETAMINOPHEN 325 MG PO TABS
650.0000 mg | ORAL_TABLET | Freq: Four times a day (QID) | ORAL | Status: DC | PRN
  Administered 2023-10-19 – 2023-10-24 (×4): 650 mg via ORAL
  Filled 2023-10-19 (×5): qty 2

## 2023-10-19 MED ORDER — VANCOMYCIN HCL IN DEXTROSE 1-5 GM/200ML-% IV SOLN
1000.0000 mg | Freq: Once | INTRAVENOUS | Status: DC
Start: 2023-10-19 — End: 2023-10-19

## 2023-10-19 MED ORDER — SEVELAMER CARBONATE 800 MG PO TABS
1600.0000 mg | ORAL_TABLET | Freq: Three times a day (TID) | ORAL | Status: DC
  Administered 2023-10-19 – 2023-11-07 (×48): 1600 mg via ORAL
  Filled 2023-10-19 (×50): qty 2

## 2023-10-19 MED ORDER — CINACALCET HCL 30 MG PO TABS
60.0000 mg | ORAL_TABLET | ORAL | Status: DC
  Administered 2023-10-19 – 2023-11-07 (×9): 60 mg via ORAL
  Filled 2023-10-19 (×9): qty 2

## 2023-10-19 MED ORDER — INSULIN GLARGINE-YFGN 100 UNIT/ML ~~LOC~~ SOPN: 50.0000 [IU] | PEN_INJECTOR | Freq: Every morning | SUBCUTANEOUS | Status: DC

## 2023-10-19 MED ORDER — METRONIDAZOLE 500 MG PO TABS
500.0000 mg | ORAL_TABLET | Freq: Two times a day (BID) | ORAL | Status: DC
  Administered 2023-10-19 – 2023-10-26 (×14): 500 mg via ORAL
  Filled 2023-10-19 (×15): qty 1

## 2023-10-19 MED ORDER — LACTATED RINGERS IV BOLUS (SEPSIS)
1000.0000 mL | Freq: Once | INTRAVENOUS | Status: AC
  Administered 2023-10-19: 1000 mL via INTRAVENOUS

## 2023-10-19 MED ORDER — ROSUVASTATIN CALCIUM 5 MG PO TABS
10.0000 mg | ORAL_TABLET | Freq: Every day | ORAL | Status: DC
  Administered 2023-10-19 – 2023-11-07 (×17): 10 mg via ORAL
  Filled 2023-10-19 (×20): qty 2

## 2023-10-19 MED ORDER — LACTATED RINGERS IV BOLUS (SEPSIS)
1000.0000 mL | Freq: Once | INTRAVENOUS | Status: DC
Start: 2023-10-19 — End: 2023-10-19

## 2023-10-19 MED ORDER — ONDANSETRON HCL 4 MG/2ML IJ SOLN
4.0000 mg | Freq: Four times a day (QID) | INTRAMUSCULAR | Status: DC | PRN
  Administered 2023-10-19: 4 mg via INTRAVENOUS
  Filled 2023-10-19: qty 2

## 2023-10-19 MED ORDER — INSULIN ASPART 100 UNIT/ML IJ SOLN
0.0000 [IU] | Freq: Three times a day (TID) | INTRAMUSCULAR | Status: DC
  Administered 2023-10-19: 2 [IU] via SUBCUTANEOUS
  Administered 2023-10-20: 3 [IU] via SUBCUTANEOUS
  Administered 2023-10-20: 2 [IU] via SUBCUTANEOUS
  Administered 2023-10-21 – 2023-10-22 (×4): 1 [IU] via SUBCUTANEOUS
  Administered 2023-10-24 (×3): 3 [IU] via SUBCUTANEOUS
  Administered 2023-10-25: 4 [IU] via SUBCUTANEOUS
  Administered 2023-10-25 – 2023-10-26 (×2): 7 [IU] via SUBCUTANEOUS
  Administered 2023-10-26: 5 [IU] via SUBCUTANEOUS
  Administered 2023-10-27: 2 [IU] via SUBCUTANEOUS
  Administered 2023-10-28 (×2): 1 [IU] via SUBCUTANEOUS
  Administered 2023-10-29: 2 [IU] via SUBCUTANEOUS
  Administered 2023-10-29: 1 [IU] via SUBCUTANEOUS

## 2023-10-19 MED ORDER — PANTOPRAZOLE SODIUM 40 MG PO TBEC
40.0000 mg | DELAYED_RELEASE_TABLET | Freq: Every day | ORAL | Status: DC
  Administered 2023-10-19 – 2023-11-07 (×17): 40 mg via ORAL
  Filled 2023-10-19 (×19): qty 1

## 2023-10-19 MED ORDER — RENA-VITE PO TABS
1.0000 | ORAL_TABLET | Freq: Every day | ORAL | Status: DC
  Administered 2023-10-19 – 2023-11-06 (×18): 1 via ORAL
  Filled 2023-10-19 (×19): qty 1

## 2023-10-19 MED ORDER — CHLORHEXIDINE GLUCONATE CLOTH 2 % EX PADS
6.0000 | MEDICATED_PAD | Freq: Every day | CUTANEOUS | Status: DC
Start: 2023-10-20 — End: 2023-10-29
  Administered 2023-10-20 – 2023-10-29 (×6): 6 via TOPICAL

## 2023-10-19 MED ORDER — METRONIDAZOLE 500 MG/100ML IV SOLN
500.0000 mg | Freq: Once | INTRAVENOUS | Status: AC
  Administered 2023-10-19: 500 mg via INTRAVENOUS
  Filled 2023-10-19: qty 100

## 2023-10-19 MED ORDER — GABAPENTIN 100 MG PO CAPS
100.0000 mg | ORAL_CAPSULE | Freq: Three times a day (TID) | ORAL | Status: DC
Start: 2023-10-19 — End: 2023-11-01
  Administered 2023-10-19 – 2023-10-31 (×32): 100 mg via ORAL
  Filled 2023-10-19 (×34): qty 1

## 2023-10-19 MED ORDER — INSULIN GLARGINE 100 UNIT/ML ~~LOC~~ SOLN
50.0000 [IU] | Freq: Every day | SUBCUTANEOUS | Status: DC
  Administered 2023-10-20 – 2023-10-27 (×7): 50 [IU] via SUBCUTANEOUS
  Filled 2023-10-19 (×10): qty 0.5

## 2023-10-19 MED ORDER — VANCOMYCIN VARIABLE DOSE PER UNSTABLE RENAL FUNCTION (PHARMACIST DOSING): Status: DC

## 2023-10-19 MED ORDER — SODIUM CHLORIDE 0.9 % IV SOLN
2.0000 g | Freq: Once | INTRAVENOUS | Status: AC
  Administered 2023-10-19: 2 g via INTRAVENOUS
  Filled 2023-10-19: qty 12.5

## 2023-10-19 MED ORDER — VANCOMYCIN HCL 2000 MG/400ML IV SOLN
2000.0000 mg | INTRAVENOUS | Status: AC
  Administered 2023-10-19: 2000 mg via INTRAVENOUS
  Filled 2023-10-19: qty 400

## 2023-10-19 NOTE — Progress Notes (Signed)
 Pt receives out-pt HD at [facility] [place] on MWF 11:25 am chair time. Will assist as needed.   Randine Mungo Dialysis Navigator [phone]

## 2023-10-19 NOTE — ED Notes (Signed)
 This RN had a conversation with Dr Ginger about the fluids and multiple antibiotics. RN concern about patient being on dialysis and multiple antibotics at once. Dr Ginger reported that he is fine with one antibiotic at a time with one bag of fluids. RN verbalized understanding.

## 2023-10-19 NOTE — H&P (Signed)
 History and Physical    Patient: Edgar Brewer FMW:[id number] DOB: 06-Jan-1961 DOA: 10/19/2023 DOS: the patient was seen and examined on 10/19/2023 PCP: Katrinka Garnette KIDD, MD  Patient coming from: Home  Chief Complaint:  Chief Complaint  Patient presents with   Altered Mental Status   HPI: Edgar Brewer is a 63 y.o. 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  The patient underwent a left toe amputation a couple of weeks ago due to gangrene. After the surgery, the patient went home and was self-managing the wound care. The patient visited the clinic for follow-up, where it was noted that the wound was producing pus. Approximately one week ago, the patient began experiencing pus and drainage from the amputation site. The patient was given a single antibiotic pill to take for one week, which was completed a couple of weeks ago. The patient's mother became concerned when the patient experienced difficulty standing and was shaking. This led to an ambulance being called late last night to bring the patient to the hospital.  In the ED, pt febrile 100.8 x1, tachycardic, and tachypneic W/ hypoxia (required 2L [place]; on RA pta). Labs notable for K 5.2, Cr 8.73, lactic acid 5.6-->0.8. US  LLE W/O DVT. XR L foot w/o acute findings or OM. EDP started IV abx (vancomycin , cefepime , and flagyl ) and requested medicine admission for sepsis iso surgical wound infection.    Review of Systems: As mentioned in the history of present illness. All other systems reviewed and are negative. Past Medical History:  Diagnosis Date   Acute on chronic combined systolic and diastolic CHF, NYHA class 1 (HCC) 01/13/2015   Allergy    Arthritis    Chronic kidney disease    MWF [facility].   Diabetes mellitus type 2 with complications (HCC)     Previous insulin , non-insulin  requiring noted 09/2018.  Complications include retinopathy, peripheral neuropathy.   GERD (gastroesophageal reflux disease)    at times   Head injury with loss of consciousness (HCC)    History of kidney stones    Hypertension    Neuromuscular disorder (HCC)    Neuropathy    Pulmonary hypertension (HCC)    PA peak pressure 41 mmHg 03/04/16 echo   Stroke [facility])    no residual weakness   Varicose veins of lower extremities with complications 12/24/2014   Past Surgical History:  Procedure Laterality Date   AMPUTATION TOE Left 09/21/2023   Procedure: AMPUTATION, TOE;  Surgeon: Malvin Marsa FALCON, DPM;  Location: [facility] OR;  Service: Orthopedics/Podiatry;  Laterality: Left;  Left 2nd toe amputation   AV FISTULA PLACEMENT Left 06/01/2016   Procedure: ARTERIOVENOUS (AV) FISTULA CREATION;  Surgeon: Oris Krystal FALCON, MD;  Location: [facility] OR;  Service: Vascular;  Laterality: Left;   AV FISTULA PLACEMENT Left 08/10/2017   Procedure: BRACHIOCEPHALIC ARTERIOVENOUS (AV) FISTULA CREATION LEFT UPPER EXTREMITY;  Surgeon: Eliza Lonni RAMAN, MD;  Location: [facility] OR;  Service: Vascular;  Laterality: Left;   CHOLECYSTECTOMY N/A 10/04/2018   Procedure: LAPAROSCOPIC CHOLECYSTECTOMY;  Surgeon: Stevie Herlene Righter, MD;  Location: [facility] OR;  Service: General;  Laterality: N/A;   COLONOSCOPY W/ POLYPECTOMY  03/2016   For evaluation of iron deficiency anemia.  Dr. Shila.  10 polyps, largest 20 mm.  Pathology: tubular adenomas with no high-grade dysplasia, hyperplastic.  None bleeding  internal hemorrhoids.  Pandiverticulosis.   CYSTOSCOPY WITH RETROGRADE PYELOGRAM, URETEROSCOPY AND STENT PLACEMENT Bilateral 02/25/2021   Procedure: CYSTOSCOPY WITH BILATERAL  RETROGRADE PYELOGRAM, RIGHT DIAGNOSTIC URETEROSCOPY,  BLADDER BIOPSY WITH FULGERATION;  Surgeon: Alvaro Hummer, MD;  Location: [facility] ORS;  Service: Urology;  Laterality: Bilateral;   ENDOVENOUS ABLATION SAPHENOUS VEIN W/ LASER Left 07-23-2015    endovenous laser ablation left greater saphenous vein by Krystal Doing MD   ENDOVENOUS ABLATION SAPHENOUS VEIN W/ LASER Right 10/15/2015   EVLA right greater saphenous vein by Krystal Doing MD   ESOPHAGOGASTRODUODENOSCOPY  03/2016   For evaluation of IDA.  Dr. Shila.  Duodenal erythema, pathology benign mucosa, no villous atrophy.   GALLBLADDER SURGERY  10/2018   HOLMIUM LASER APPLICATION Right 02/25/2021   Procedure: HOLMIUM LASER OF TUMOR;  Surgeon: Alvaro Hummer, MD;  Location: [facility] ORS;  Service: Urology;  Laterality: Right;   IR RADIOLOGIST EVAL & MGMT  11/29/2018   LOWER EXTREMITY ANGIOGRAPHY N/A 09/20/2023   Procedure: Lower Extremity Angiography;  Surgeon: Magda Debby SAILOR, MD;  Location: [facility] INVASIVE CV LAB;  Service: Cardiovascular;  Laterality: N/A;   LOWER EXTREMITY INTERVENTION Left 09/20/2023   Procedure: LOWER EXTREMITY INTERVENTION;  Surgeon: Magda Debby SAILOR, MD;  Location: [facility] INVASIVE CV LAB;  Service: Cardiovascular;  Laterality: Left;   TEE WITHOUT CARDIOVERSION N/A 11/19/2018   Procedure: TRANSESOPHAGEAL ECHOCARDIOGRAM (TEE);  Surgeon: Alveta Aleene PARAS, MD;  Location: [facility] ENDOSCOPY;  Service: Cardiovascular;  Laterality: N/A;   Social History:  reports that he quit smoking about 8 years ago. His smoking use included cigarettes. He started smoking about 53 years ago. He has a 45 pack-year smoking history. He has never used smokeless tobacco. He reports current alcohol use of about 1.0 standard drink of alcohol per week. He reports that he does not use drugs.  Allergies  Allergen Reactions   Codeine Anaphylaxis, Hives, Swelling and Other (See Comments)    Swelling all over body and the throat   Clindamycin /Lincomycin Nausea And Vomiting    Family History  Problem Relation Age of Onset   Arthritis Mother    Hearing loss Father    Prostate cancer Father    Hyperlipidemia Maternal Grandmother    Heart disease Maternal Grandmother    Lung cancer Maternal Grandfather      Prior to Admission medications   Medication Sig Start Date End Date Taking? Authorizing Provider  aspirin  EC 81 MG tablet Take 81 mg by mouth daily. Swallow whole.   Yes [provider]  b complex-vitamin c -folic acid  (NEPHRO-VITE) 0.8 MG TABS tablet Take 1 tablet by mouth in the morning.   Yes [provider]  cinacalcet  (SENSIPAR ) 60 MG tablet Take 60 mg by mouth every Tuesday, Thursday, and Saturday at 6 PM. In the evening.   Yes [provider]  diphenhydramine -acetaminophen  (TYLENOL  PM) 25-500 MG TABS tablet Take 1 tablet by mouth at bedtime as needed (Sleep/Pain).   Yes [provider]  Docusate Calcium  (STOOL SOFTENER PO) Take 1-2 tablets by mouth 2 (two) times daily as needed (Constipation).   Yes [provider]  gabapentin  (NEURONTIN ) 100 MG capsule TAKE 1 CAPSULE BY MOUTH THREE TIMES DAILY 07/13/23  Yes Katrinka Garnette KIDD, MD  LANTUS  SOLOSTAR 100 UNIT/ML Solostar Pen Inject 50 Units into the skin daily. 10/06/23  Yes [provider]  omeprazole  (PRILOSEC) 20 MG capsule Take 1 capsule by mouth once daily Patient taking differently: Take 20 mg by mouth every evening. 07/13/23  Yes Katrinka Garnette KIDD,  MD  rosuvastatin  (CRESTOR ) 10 MG tablet Take 1 tablet by mouth once daily 08/07/23  Yes Katrinka Garnette KIDD, MD  sevelamer  carbonate (RENVELA ) 800 MG tablet Take 2 tablets (1,600 mg total) by mouth 3 (three) times daily with meals. 10/22/18  Yes Cheryle Page, MD  insulin  glargine-yfgn (SEMGLEE ) 100 UNIT/ML Pen Inject 50 Units into the skin every morning. Patient not taking: Reported on 10/19/2023 10/05/23   Katrinka Garnette KIDD, MD    Physical Exam: Vitals:   10/19/23 1030 10/19/23 1115 10/19/23 1200 10/19/23 1331  BP: 134/66 132/70 (!) 117/39 132/60  Pulse: 99 100 99 (!) 103  Resp: (!) 25 (!) 23 (!) 23   Temp:   98.8 F (37.1 C) 98.6 F (37 C)  TempSrc:   Oral Oral  SpO2: 100% 100% 100% 91%  Weight:      Height:       General:  Alert, oriented x3, resting comfortably in no acute distress Respiratory: Lungs clear to auscultation bilaterally with normal respiratory effort; no w/r/r Cardiovascular: Regular rate and rhythm w/o m/r/g Skin: LLE with ACE bandage in place c/d/i   Data Reviewed:  Lab Results  Component Value Date   WBC 8.6 10/19/2023   HGB 11.1 (L) 10/19/2023   HCT 34.2 (L) 10/19/2023   MCV 97.7 10/19/2023   PLT 208 10/19/2023   Lab Results  Component Value Date   GLUCOSE 102 (H) 10/19/2023   CALCIUM  9.8 10/19/2023   NA 136 10/19/2023   K 5.2 (H) 10/19/2023   CO2 27 10/19/2023   CL 92 (L) 10/19/2023   BUN 27 (H) 10/19/2023   CREATININE 8.73 (H) 10/19/2023   Lab Results  Component Value Date   ALT 19 10/19/2023   AST 19 10/19/2023   GGT 668 (H) 10/20/2018   ALKPHOS 255 (H) 10/19/2023   BILITOT 1.1 10/19/2023   Lab Results  Component Value Date   INR 1.1 10/19/2023   INR 1.0 11/14/2018   INR 1.0 11/01/2018   Radiology: VAS US  LOWER EXTREMITY VENOUS (DVT) (ONLY [facility]) Result Date: 10/19/2023  Lower Venous DVT Study Patient Name:  KAELEN CAUGHLIN  Date of Exam:   10/19/2023 Medical Rec #: [id number]     Accession #:    [id number] Date of Birth: 05/30/1960     Patient Gender: M Patient Age:   47 years Exam Location:  [facility] Procedure:      VAS US  LOWER EXTREMITY VENOUS (DVT) Referring Phys: LONNI SAKAI --------------------------------------------------------------------------------  Indications: Swelling, Edema, Erythema, and ulceration.  Limitations: Body habitus and poor ultrasound/tissue interface. Comparison Study: Previous study on 4.24.2022. Performing Technologist: Edilia Elden Appl  Examination Guidelines: A complete evaluation includes B-mode imaging, spectral Doppler, color Doppler, and power Doppler as needed of all accessible portions of each vessel. Bilateral testing is considered an integral part of a complete examination. Limited examinations for reoccurring  indications may be performed as noted. The reflux portion of the exam is performed with the patient in reverse Trendelenburg.  +-----+---------------+---------+-----------+----------+--------------+ RIGHTCompressibilityPhasicitySpontaneityPropertiesThrombus Aging +-----+---------------+---------+-----------+----------+--------------+ CFV  Full           Yes      Yes                                 +-----+---------------+---------+-----------+----------+--------------+ SFJ  Full           Yes      Yes                                 +-----+---------------+---------+-----------+----------+--------------+   +---------+---------------+---------+-----------+----------+--------------+  LEFT     CompressibilityPhasicitySpontaneityPropertiesThrombus Aging +---------+---------------+---------+-----------+----------+--------------+ CFV      Full           Yes      Yes                                 +---------+---------------+---------+-----------+----------+--------------+ SFJ      Full           Yes      Yes                                 +---------+---------------+---------+-----------+----------+--------------+ FV Prox  Full                                                        +---------+---------------+---------+-----------+----------+--------------+ FV Mid   Full                                                        +---------+---------------+---------+-----------+----------+--------------+ FV DistalFull                                                        +---------+---------------+---------+-----------+----------+--------------+ PFV      Full                                                        +---------+---------------+---------+-----------+----------+--------------+ POP      Full           Yes      Yes                                 +---------+---------------+---------+-----------+----------+--------------+ PTV      Full                                                         +---------+---------------+---------+-----------+----------+--------------+ PERO     Full                                                        +---------+---------------+---------+-----------+----------+--------------+    Summary: RIGHT: - No evidence of common femoral vein obstruction.   LEFT: - There is no evidence of deep vein thrombosis in the lower extremity.  - No cystic structure found in the popliteal fossa.  *See table(s) above for measurements and observations.    Preliminary  DG Foot Complete Left Result Date: 10/19/2023 CLINICAL DATA:  Sepsis.  Possible osteomyelitis. EXAM: LEFT FOOT - COMPLETE 3+ VIEW COMPARISON:  10/17/2023 FINDINGS: Mild diffuse decreased bone mineralization is present. Evidence of previous amputation distal to the base of the second proximal phalanx unchanged. Degenerative changes over the midfoot and hindfoot as well as tibiotalar joint which are stable. Small vessel atherosclerotic disease is present. No evidence to suggest osteomyelitis. Minimal diffuse soft tissue swelling over the forefoot. IMPRESSION: 1. No acute findings. No evidence of osteomyelitis. 2. Degenerative changes as described. 3. Minimal diffuse soft tissue swelling over the forefoot. Electronically Signed   By: Toribio Agreste M.D.   On: 10/19/2023 09:13   DG Chest Port 1 View Result Date: 10/19/2023 CLINICAL DATA:  Questionable sepsis. EXAM: PORTABLE CHEST 1 VIEW COMPARISON:  04/14/2023 FINDINGS: Lungs are adequately inflated without focal airspace consolidation or effusion. Cardiomediastinal silhouette and remainder of the exam is unchanged. IMPRESSION: No active disease. Electronically Signed   By: Toribio Agreste M.D.   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  Sepsis 2/2 surgical wound infection S/p recent amputation of L 2nd toe through proximal phalanx -Podiatry consulted; apprec eval/recs -Continue IV vanc/cefepime /flagyl  for now -MIVF: LR at 150cc/h for now -F/u blood cultures; if positive will need TTE to exclude IE -Will defer to podiatry regarding need for LLE MRI to eval for OM  ESRD -Renal consulted; apprec eval/recs -MIVF per above iso sepsis; will need regularly scheduled HD tomorrow -PTA Renvela  and gabapentin  -Strict I&Os and daily weights (standing preferred) -F/u BMP daily -Renally dose medications for CrCl -Avoid lovenox , NSAIDs, morphine, Fleet's phosphate enema, regular insulin , contrast; no gadolinium for MRI to avoid nephrogenic systemic fibrosis  HLD -PTA Crestor  10mg  daily   Advance Care Planning:   Code Status: Full Code   Consults: Renal and podiatry  Family Communication: Mother  Severity of Illness: The appropriate patient status for this patient is INPATIENT. Inpatient status is judged to be reasonable and necessary in order to provide the required intensity of service to ensure the patient's safety. The patient's presenting symptoms, physical exam findings, and initial radiographic and laboratory data in the context of their chronic comorbidities is felt to place them at high risk for further clinical deterioration. Furthermore, it is not anticipated that the patient will be medically stable for discharge from the hospital within 2 midnights of admission.   * I certify that at the point of admission it is my clinical judgment that the patient will require inpatient hospital care spanning beyond 2 midnights from the point of admission due to high intensity of service, high risk for further deterioration and high frequency of surveillance required.*   ------- I spent 60 minutes reviewing previous notes, at the bedside counseling/discussing the treatment plan, and performing clinical  documentation.  Author: Marsha Ada, MD 10/19/2023 1:33 PM  For on call review [email or web address].

## 2023-10-19 NOTE — ED Notes (Signed)
 Got patient into a gown on the monitor did EKG shown to er provider patient is resting with call bell in reach

## 2023-10-19 NOTE — Telephone Encounter (Signed)
 DOS- 10/26/2023  2ND TOE PARTIAL EXCISION BONE, TALUS OR CALCANEOUS PHALANX OF TOE LT- 28124  [facility] EFFECTIVE DATE- 06/25/2023  DEDUCTIBLE- $0 OOP- $4200 REMAINING- $938.32  PER FAX RECEIVED FROM [employer], NO PRIOR AUTH IS REQUIRED FOR CPT CODE 71875. DOCUMENTATION ATTACHED TO SURGERY CONSENT PACKET.

## 2023-10-19 NOTE — ED Notes (Signed)
 Urine not collected. Patient reports that he does not make urine and when he does it is minimal. Patient informed that if he feels that he is able to provide a urine sample to alert RN or tech so that it can be collected. Patient verbalized understanding.

## 2023-10-19 NOTE — Sepsis Progress Note (Signed)
 Elink will follow per sepsis protocol.

## 2023-10-19 NOTE — ED Notes (Signed)
 Patient moved to other side of the ER to wait for admit bed to be available. Patient report given to Brianna, RN who will take over care until patient is moved upstairs.

## 2023-10-19 NOTE — Consult Note (Signed)
 [facility] Renal Consultation Note    Indication for Consultation:  Management of ESRD/hemodialysis; anemia, hypertension/volume and secondary hyperparathyroidism   HPI: Edgar Brewer is a 63 y.o. male with ESRD on HD MWF, HFpEF, T2DM, prior CVA. He is s/p left toe amputation last month now admitted with surgical wound infection and concern for sepsis. On arrival he was febrile, tachycardic, and tachypneic. Labs notable for K 5.2, lactic acid 5.6. Blood cultures collected. IV Vancomycin  started. Also has received 3L IVF bolus this am.   Dialysis on MWF via AVF.  Last dialysis was Wednesday. He completed a full treatment and has not missed any dialysis.  Seen in and examined at bedside. He is comfortable in bed. Afebrile. Endorses drainage and odor from left foot wound for the last week.  He denies any specific complaints today. Denies chest pain, sob. Mostly he's concerned about getting the television to work.   Past Medical History:  Diagnosis Date   Acute on chronic combined systolic and diastolic CHF, NYHA class 1 (HCC) 01/13/2015   Allergy    Arthritis    Chronic kidney disease    MWF [facility].   Diabetes mellitus type 2 with complications (HCC)    Previous insulin , non-insulin  requiring noted 09/2018.  Complications include retinopathy, peripheral neuropathy.   GERD (gastroesophageal reflux disease)    at times   Head injury with loss of consciousness (HCC)    History of kidney stones    Hypertension    Neuromuscular disorder (HCC)    Neuropathy    Pulmonary hypertension (HCC)    PA peak pressure 41 mmHg 03/04/16 echo   Stroke [facility])    no residual weakness   Varicose veins of lower extremities with complications 12/24/2014   Past Surgical History:  Procedure Laterality Date   AMPUTATION TOE Left 09/21/2023   Procedure: AMPUTATION, TOE;  Surgeon: Malvin Marsa FALCON, DPM;  Location: [facility] OR;  Service: Orthopedics/Podiatry;  Laterality: Left;  Left 2nd toe  amputation   AV FISTULA PLACEMENT Left 06/01/2016   Procedure: ARTERIOVENOUS (AV) FISTULA CREATION;  Surgeon: Oris Krystal FALCON, MD;  Location: [facility] OR;  Service: Vascular;  Laterality: Left;   AV FISTULA PLACEMENT Left 08/10/2017   Procedure: BRACHIOCEPHALIC ARTERIOVENOUS (AV) FISTULA CREATION LEFT UPPER EXTREMITY;  Surgeon: Eliza Lonni RAMAN, MD;  Location: [facility] OR;  Service: Vascular;  Laterality: Left;   CHOLECYSTECTOMY N/A 10/04/2018   Procedure: LAPAROSCOPIC CHOLECYSTECTOMY;  Surgeon: Stevie Herlene Righter, MD;  Location: [facility] OR;  Service: General;  Laterality: N/A;   COLONOSCOPY W/ POLYPECTOMY  03/2016   For evaluation of iron deficiency anemia.  Dr. Shila.  10 polyps, largest 20 mm.  Pathology: tubular adenomas with no high-grade dysplasia, hyperplastic.  None bleeding internal hemorrhoids.  Pandiverticulosis.   CYSTOSCOPY WITH RETROGRADE PYELOGRAM, URETEROSCOPY AND STENT PLACEMENT Bilateral 02/25/2021   Procedure: CYSTOSCOPY WITH BILATERAL  RETROGRADE PYELOGRAM, RIGHT DIAGNOSTIC URETEROSCOPY,  BLADDER BIOPSY WITH FULGERATION;  Surgeon: Alvaro Hummer, MD;  Location: [facility] ORS;  Service: Urology;  Laterality: Bilateral;   ENDOVENOUS ABLATION SAPHENOUS VEIN W/ LASER Left 07-23-2015   endovenous laser ablation left greater saphenous vein by Krystal Oris MD   ENDOVENOUS ABLATION SAPHENOUS VEIN W/ LASER Right 10/15/2015   EVLA right greater saphenous vein by Krystal Oris MD   ESOPHAGOGASTRODUODENOSCOPY  03/2016   For evaluation of IDA.  Dr. Shila.  Duodenal erythema, pathology benign mucosa, no villous atrophy.   GALLBLADDER SURGERY  10/2018   HOLMIUM LASER APPLICATION Right 02/25/2021   Procedure: HOLMIUM LASER  OF TUMOR;  Surgeon: Alvaro Hummer, MD;  Location: [facility] ORS;  Service: Urology;  Laterality: Right;   IR RADIOLOGIST EVAL & MGMT  11/29/2018   LOWER EXTREMITY ANGIOGRAPHY N/A 09/20/2023   Procedure: Lower Extremity Angiography;  Surgeon: Magda Debby SAILOR, MD;  Location: [facility] INVASIVE CV LAB;   Service: Cardiovascular;  Laterality: N/A;   LOWER EXTREMITY INTERVENTION Left 09/20/2023   Procedure: LOWER EXTREMITY INTERVENTION;  Surgeon: Magda Debby SAILOR, MD;  Location: [facility] INVASIVE CV LAB;  Service: Cardiovascular;  Laterality: Left;   TEE WITHOUT CARDIOVERSION N/A 11/19/2018   Procedure: TRANSESOPHAGEAL ECHOCARDIOGRAM (TEE);  Surgeon: Alveta Aleene PARAS, MD;  Location: [facility] ENDOSCOPY;  Service: Cardiovascular;  Laterality: N/A;   Family History  Problem Relation Age of Onset   Arthritis Mother    Hearing loss Father    Prostate cancer Father    Hyperlipidemia Maternal Grandmother    Heart disease Maternal Grandmother    Lung cancer Maternal Grandfather    Social History:  reports that he quit smoking about 8 years ago. His smoking use included cigarettes. He started smoking about 53 years ago. He has a 45 pack-year smoking history. He has never used smokeless tobacco. He reports current alcohol use of about 1.0 standard drink of alcohol per week. He reports that he does not use drugs. Allergies  Allergen Reactions   Codeine Anaphylaxis, Hives, Swelling and Other (See Comments)    Swelling all over body and the throat   Clindamycin /Lincomycin Nausea And Vomiting   Prior to Admission medications   Medication Sig Start Date End Date Taking? Authorizing Provider  aspirin  EC 81 MG tablet Take 81 mg by mouth daily. Swallow whole.   Yes [provider]  b complex-vitamin c -folic acid  (NEPHRO-VITE) 0.8 MG TABS tablet Take 1 tablet by mouth in the morning.   Yes [provider]  cinacalcet  (SENSIPAR ) 60 MG tablet Take 60 mg by mouth every Tuesday, Thursday, and Saturday at 6 PM. In the evening.   Yes [provider]  diphenhydramine -acetaminophen  (TYLENOL  PM) 25-500 MG TABS tablet Take 1 tablet by mouth at bedtime as needed (Sleep/Pain).   Yes [provider]  Docusate Calcium  (STOOL SOFTENER PO) Take 1-2 tablets by mouth 2 (two) times daily as needed  (Constipation).   Yes [provider]  gabapentin  (NEURONTIN ) 100 MG capsule TAKE 1 CAPSULE BY MOUTH THREE TIMES DAILY 07/13/23  Yes Katrinka Garnette KIDD, MD  LANTUS  SOLOSTAR 100 UNIT/ML Solostar Pen Inject 50 Units into the skin daily. 10/06/23  Yes [provider]  omeprazole  (PRILOSEC) 20 MG capsule Take 1 capsule by mouth once daily Patient taking differently: Take 20 mg by mouth every evening. 07/13/23  Yes Katrinka Garnette KIDD, MD  rosuvastatin  (CRESTOR ) 10 MG tablet Take 1 tablet by mouth once daily 08/07/23  Yes Katrinka Garnette KIDD, MD  sevelamer  carbonate (RENVELA ) 800 MG tablet Take 2 tablets (1,600 mg total) by mouth 3 (three) times daily with meals. 10/22/18  Yes Cheryle Page, MD  insulin  glargine-yfgn (SEMGLEE ) 100 UNIT/ML Pen Inject 50 Units into the skin every morning. Patient not taking: Reported on 10/19/2023 10/05/23   Katrinka Garnette KIDD, MD   Current Facility-Administered Medications  Medication Dose Route Frequency Provider Last Rate Last Admin   [START ON 10/20/2023] ceFEPIme  (MAXIPIME ) 1 g in sodium chloride  0.9 % 100 mL IVPB  1 g Intravenous Q24H Merilee Linsey I, RPH       cinacalcet  (SENSIPAR ) tablet 60 mg  60 mg Oral Q T,Th,Sat-1800 Georgina Basket,  MD       enoxaparin  (LOVENOX ) injection 30 mg  30 mg Subcutaneous Q24H Georgina Basket, MD   30 mg at 10/19/23 1403   gabapentin  (NEURONTIN ) capsule 100 mg  100 mg Oral TID Georgina Basket, MD       insulin  aspart (novoLOG ) injection 0-9 Units  0-9 Units Subcutaneous TID WC Georgina Basket, MD       insulin  glargine (LANTUS ) injection 50 Units  50 Units Subcutaneous Daily Georgina Basket, MD       lactated ringers  infusion   Intravenous Continuous Tegeler, Lonni PARAS, MD 150 mL/hr at 10/19/23 1212 New Bag at 10/19/23 1212   metroNIDAZOLE  (FLAGYL ) IVPB 500 mg  500 mg Intravenous Once Tegeler, Lonni PARAS, MD       metroNIDAZOLE  (FLAGYL ) tablet 500 mg  500 mg Oral Q12H Georgina Basket, MD       multivitamin (RENA-VIT) tablet 1  tablet  1 tablet Oral QHS Georgina Basket, MD       pantoprazole  (PROTONIX ) EC tablet 40 mg  40 mg Oral Daily Georgina Basket, MD   40 mg at 10/19/23 1404   rosuvastatin  (CRESTOR ) tablet 10 mg  10 mg Oral Daily Georgina Basket, MD   10 mg at 10/19/23 1403   sevelamer  carbonate (RENVELA ) tablet 1,600 mg  1,600 mg Oral TID WC Georgina Basket, MD       vancomycin  variable dose per unstable renal function (pharmacist dosing)   Does not apply See admin instructions Merilee Linsey I, RPH         ROS: As per HPI otherwise negative.  Physical Exam: Vitals:   10/19/23 1030 10/19/23 1115 10/19/23 1200 10/19/23 1331  BP: 134/66 132/70 (!) 117/39 132/60  Pulse: 99 100 99 (!) 103  Resp: (!) 25 (!) 23 (!) 23 20  Temp:   98.8 F (37.1 C) 98.6 F (37 C)  TempSrc:   Oral Oral  SpO2: 100% 100% 100% 91%  Weight:      Height:         General: Appears comfortable, in no distress  Head: NCAT sclera not icteric MMM Neck: Supple. No JVD appreciated  Lungs: Clear bilaterally without wheezes, rales, or rhonchi. Normal WOB  Heart: RRR, no murmur, rub, or gallop  Abdomen: soft non-tender, bowel sounds normal, no masses  Lower extremities: no significant LE edema; L foot bandaged  Neuro: A & O X 3. Moves all extremities spontaneously. Psych:  Responds to questions appropriately with a normal affect. Dialysis Access: LUE AVF +bruit   Labs: Basic Metabolic Panel: Recent Labs  Lab 10/19/23 0900  NA 136  K 5.2*  CL 92*  CO2 27  GLUCOSE 102*  BUN 27*  CREATININE 8.73*  CALCIUM  9.8   Liver Function Tests: Recent Labs  Lab 10/19/23 0900  AST 19  ALT 19  ALKPHOS 255*  BILITOT 1.1  PROT 7.1  ALBUMIN 3.0*   Recent Labs  Lab 10/19/23 0900  LIPASE 14   No results for input(s): AMMONIA in the last 168 hours. CBC: Recent Labs  Lab 10/19/23 0900  WBC 8.6  NEUTROABS 7.5  HGB 11.1*  HCT 34.2*  MCV 97.7  PLT 208   Cardiac Enzymes: No results for input(s): CKTOTAL, CKMB, CKMBINDEX,  TROPONINI in the last 168 hours. CBG: No results for input(s): GLUCAP in the last 168 hours. Iron Studies: No results for input(s): IRON, TIBC, TRANSFERRIN, FERRITIN in the last 72 hours. Studies/Results: VAS US  LOWER EXTREMITY VENOUS (DVT) (ONLY [facility]) Result  Date: 10/19/2023  Lower Venous DVT Study Patient Name:  Edgar Brewer  Date of Exam:   10/19/2023 Medical Rec #: [id number]     Accession #:    [id number] Date of Birth: 03-01-1960     Patient Gender: M Patient Age:   18 years Exam Location:  [facility] Procedure:      VAS US  LOWER EXTREMITY VENOUS (DVT) Referring Phys: LONNI SAKAI --------------------------------------------------------------------------------  Indications: Swelling, Edema, Erythema, and ulceration.  Limitations: Body habitus and poor ultrasound/tissue interface. Comparison Study: Previous study on 4.24.2022. Performing Technologist: Edilia Elden Appl  Examination Guidelines: A complete evaluation includes B-mode imaging, spectral Doppler, color Doppler, and power Doppler as needed of all accessible portions of each vessel. Bilateral testing is considered an integral part of a complete examination. Limited examinations for reoccurring indications may be performed as noted. The reflux portion of the exam is performed with the patient in reverse Trendelenburg.  +-----+---------------+---------+-----------+----------+--------------+ RIGHTCompressibilityPhasicitySpontaneityPropertiesThrombus Aging +-----+---------------+---------+-----------+----------+--------------+ CFV  Full           Yes      Yes                                 +-----+---------------+---------+-----------+----------+--------------+ SFJ  Full           Yes      Yes                                 +-----+---------------+---------+-----------+----------+--------------+   +---------+---------------+---------+-----------+----------+--------------+ LEFT      CompressibilityPhasicitySpontaneityPropertiesThrombus Aging +---------+---------------+---------+-----------+----------+--------------+ CFV      Full           Yes      Yes                                 +---------+---------------+---------+-----------+----------+--------------+ SFJ      Full           Yes      Yes                                 +---------+---------------+---------+-----------+----------+--------------+ FV Prox  Full                                                        +---------+---------------+---------+-----------+----------+--------------+ FV Mid   Full                                                        +---------+---------------+---------+-----------+----------+--------------+ FV DistalFull                                                        +---------+---------------+---------+-----------+----------+--------------+ PFV      Full                                                        +---------+---------------+---------+-----------+----------+--------------+  POP      Full           Yes      Yes                                 +---------+---------------+---------+-----------+----------+--------------+ PTV      Full                                                        +---------+---------------+---------+-----------+----------+--------------+ PERO     Full                                                        +---------+---------------+---------+-----------+----------+--------------+    Summary: RIGHT: - No evidence of common femoral vein obstruction.   LEFT: - There is no evidence of deep vein thrombosis in the lower extremity.  - No cystic structure found in the popliteal fossa.  *See table(s) above for measurements and observations.    Preliminary    DG Foot Complete Left Result Date: 10/19/2023 CLINICAL DATA:  Sepsis.  Possible osteomyelitis. EXAM: LEFT FOOT - COMPLETE 3+ VIEW COMPARISON:  10/17/2023  FINDINGS: Mild diffuse decreased bone mineralization is present. Evidence of previous amputation distal to the base of the second proximal phalanx unchanged. Degenerative changes over the midfoot and hindfoot as well as tibiotalar joint which are stable. Small vessel atherosclerotic disease is present. No evidence to suggest osteomyelitis. Minimal diffuse soft tissue swelling over the forefoot. IMPRESSION: 1. No acute findings. No evidence of osteomyelitis. 2. Degenerative changes as described. 3. Minimal diffuse soft tissue swelling over the forefoot. Electronically Signed   By: Toribio Agreste M.D.   On: 10/19/2023 09:13   DG Chest Port 1 View Result Date: 10/19/2023 CLINICAL DATA:  Questionable sepsis. EXAM: PORTABLE CHEST 1 VIEW COMPARISON:  04/14/2023 FINDINGS: Lungs are adequately inflated without focal airspace consolidation or effusion. Cardiomediastinal silhouette and remainder of the exam is unchanged. IMPRESSION: No active disease. Electronically Signed   By: Toribio Agreste M.D.   On: 10/19/2023 09:11   DG Foot Complete Left Result Date: 10/17/2023 Please see detailed radiograph report in office note.   Dialysis Orders:  NW MWF BFR 400 EDW 96.8 kg 2K/2.5Ca AVF  -Heparin  2600 -Calcitriol 1.75 TIW  Assessment/Plan: Sepsis/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 IV antibiotics per primary. Blood cultures pending. Podiatry consulted. Per primary team.  ESRD.  HD MWF. No urgent dialysis indications today. Next HD Friday   Hypertension/volume. Blood pressure controlled. Euvolemic appearing. UF to EDW.  Anemia. Hgb stable. No ESA indications. Follow trends  Metabolic bone disease.  Continue home binders. Follow Ca/Phos trends.   [name] PA-C [facility] 10/19/2023, 2:13 PM

## 2023-10-19 NOTE — ED Triage Notes (Signed)
 Patient presents to the ER via EMS from home. Mother called 911 stating that patient was not acting his normal. Patient has had 2 previous strokes. Patient was found to be at 86% on room air. Patient denies any home O2 therapy. Pateint 94% in ER on room air. In ER patient nauseated. Patient has a hx of diabetes with recent surgery on the left great toe for infection with a reported having to have an additional surgery on Thursday. Patient is confused unable to states the month. Patient denies pain.

## 2023-10-19 NOTE — Progress Notes (Signed)
 Pharmacy Antibiotic Note  Edgar Brewer is a 63 y.o. male admitted on 10/19/2023 with sepsis.  Pharmacy has been consulted for cefepime , vancomycin  dosing.  Pt is ESRD on iHD   Plan: Vancomycin  2g IV x1 in ED, no standing vanc pending iHD schedule  Goal trough 15-8mcg/mL -F/u renal function, LOT, and culture data -F/u vancomycin  levels PRN per protocol  Cefepime  1g IV q24 to be given after HD on HD days Flagyl  500mg  PO  q 12 h   Height: 5' 8 (172.7 cm) Weight: 98.9 kg (218 lb) IBW/kg (Calculated) : 68.4  Temp (24hrs), Avg:99.4 F (37.4 C), Min:98.6 F (37 C), Max:100.8 F (38.2 C)  Recent Labs  Lab 10/19/23 0900 10/19/23 0931 10/19/23 1206 10/19/23 1217  WBC 8.6  --   --   --   CREATININE 8.73*  --   --   --   LATICACIDVEN  --  1.1 5.6* 0.8    Estimated Creatinine Clearance: 9.7 mL/min (A) (by C-G formula based on SCr of 8.73 mg/dL (H)).    Allergies  Allergen Reactions   Codeine Anaphylaxis, Hives, Swelling and Other (See Comments)    Swelling all over body and the throat   Clindamycin /Lincomycin Nausea And Vomiting    Antimicrobials this admission: Vanc 9/25> Cefepime  9/25> Flagyl  9/25>  Dose adjustments this admission:   Microbiology results: 9/25 [facility] 9/25 RVP neg   Thank you for allowing pharmacy to be a part of this patient's care.  Sharyne Glatter, PharmD, BCCCP Critical Care Clinical Pharmacist 10/19/2023 1:04 PM

## 2023-10-19 NOTE — ED Provider Notes (Signed)
 [place] EMERGENCY DEPARTMENT AT [facility] Provider Note   CSN: [id number] Arrival date & time: 10/19/23  9196     Patient presents with: No chief complaint on file.   Edgar Brewer is a 63 y.o. male.   The history is provided by the patient and medical records. No language interpreter was used.  Illness Location:  Sepsis Severity:  Severe Onset quality:  Gradual Duration:  1 day Timing:  Constant Progression:  Unchanged Chronicity:  New Associated symptoms: cough, fatigue, fever, nausea, shortness of breath and vomiting   Associated symptoms: no abdominal pain, no chest pain, no congestion, no diarrhea, no headaches, no loss of consciousness, no rash and no wheezing        Prior to Admission medications   Medication Sig Start Date End Date Taking? Authorizing Provider  aspirin  EC 81 MG tablet Take 81 mg by mouth daily. Swallow whole.    [provider]  b complex-vitamin c -folic acid  (NEPHRO-VITE) 0.8 MG TABS tablet Take 1 tablet by mouth in the morning.    [provider]  BD PEN NEEDLE SHORT ULTRAFINE 31G X 8 MM MISC USE TO INJECT INSULIN  INTO THE SKIN DAILY. 07/31/23   Katrinka Garnette KIDD, MD  cinacalcet  (SENSIPAR ) 60 MG tablet Take 60 mg by mouth every Tuesday, Thursday, and Saturday at 6 PM. In the evening.    [provider]  gabapentin  (NEURONTIN ) 100 MG capsule TAKE 1 CAPSULE BY MOUTH THREE TIMES DAILY 07/13/23   Katrinka Garnette KIDD, MD  insulin  glargine-yfgn (SEMGLEE ) 100 UNIT/ML Pen Inject 50 Units into the skin every morning. 10/05/23   Katrinka Garnette KIDD, MD  omeprazole  (PRILOSEC) 20 MG capsule Take 1 capsule by mouth once daily Patient taking differently: Take 20 mg by mouth every evening. 07/13/23   Katrinka Garnette KIDD, MD  oxyCODONE  (OXY IR/ROXICODONE ) 5 MG immediate release tablet Take 1 tablet (5 mg total) by mouth every 6 (six) hours as needed for moderate pain (pain score 4-6). Patient not taking: Reported on 10/17/2023 09/22/23    Gherghe, Costin M, MD  rosuvastatin  (CRESTOR ) 10 MG tablet Take 1 tablet by mouth once daily 08/07/23   Katrinka Garnette KIDD, MD  sevelamer  carbonate (RENVELA ) 800 MG tablet Take 2 tablets (1,600 mg total) by mouth 3 (three) times daily with meals. 10/22/18   Cheryle Page, MD    Allergies: Codeine and Clindamycin /lincomycin    Review of Systems  Constitutional:  Positive for chills, fatigue and fever.  HENT:  Negative for congestion.   Respiratory:  Positive for cough and shortness of breath. Negative for chest tightness, wheezing and stridor.   Cardiovascular:  Positive for leg swelling. Negative for chest pain and palpitations.  Gastrointestinal:  Positive for nausea and vomiting. Negative for abdominal pain, constipation and diarrhea.  Genitourinary:  Negative for dysuria, flank pain and frequency.  Musculoskeletal:  Negative for back pain, neck pain and neck stiffness.  Skin:  Positive for wound. Negative for rash.  Neurological:  Negative for loss of consciousness, weakness, light-headedness, numbness and headaches.  Psychiatric/Behavioral:  Negative for agitation and confusion.   All other systems reviewed and are negative.   Updated Vital Signs There were no vitals taken for this visit.  Physical Exam Vitals and nursing note reviewed.  Constitutional:      General: He is not in acute distress.    Appearance: He is well-developed. He is ill-appearing. He is not toxic-appearing or diaphoretic.  HENT:     Head: Normocephalic and atraumatic.  Nose: No congestion or rhinorrhea.     Mouth/Throat:     Mouth: Mucous membranes are moist.     Pharynx: No oropharyngeal exudate or posterior oropharyngeal erythema.  Eyes:     Extraocular Movements: Extraocular movements intact.     Conjunctiva/sclera: Conjunctivae normal.     Pupils: Pupils are equal, round, and reactive to light.  Cardiovascular:     Rate and Rhythm: Regular rhythm. Tachycardia present.     Heart sounds: No murmur  heard. Pulmonary:     Effort: Pulmonary effort is normal. No respiratory distress.     Breath sounds: Rhonchi present. No wheezing or rales.  Chest:     Chest wall: No tenderness.  Abdominal:     Palpations: Abdomen is soft.     Tenderness: There is no abdominal tenderness. There is no guarding or rebound.  Musculoskeletal:        General: No swelling or tenderness.     Cervical back: Neck supple. No tenderness.  Skin:    General: Skin is warm and dry.     Capillary Refill: Capillary refill takes less than 2 seconds.     Findings: Erythema present.  Neurological:     General: No focal deficit present.     Mental Status: He is alert.     Sensory: No sensory deficit.     Motor: No weakness.  Psychiatric:        Mood and Affect: Mood normal.     (all labs ordered are listed, but only abnormal results are displayed) Labs Reviewed  COMPREHENSIVE METABOLIC PANEL WITH GFR - Abnormal; Notable for the following components:      Result Value   Potassium 5.2 (*)    Chloride 92 (*)    Glucose, Bld 102 (*)    BUN 27 (*)    Creatinine, Ser 8.73 (*)    Albumin 3.0 (*)    Alkaline Phosphatase 255 (*)    GFR, Estimated 6 (*)    Anion gap 17 (*)    All other components within normal limits  CBC WITH DIFFERENTIAL/PLATELET - Abnormal; Notable for the following components:   RBC 3.50 (*)    Hemoglobin 11.1 (*)    HCT 34.2 (*)    Lymphs Abs 0.4 (*)    All other components within normal limits  PROTIME-INR - Abnormal; Notable for the following components:   Prothrombin Time 15.3 (*)    All other components within normal limits  TROPONIN I (HIGH SENSITIVITY) - Abnormal; Notable for the following components:   Troponin I (High Sensitivity) 24 (*)    All other components within normal limits  RESP PANEL BY RT-PCR (RSV, FLU A&B, COVID)  RVPGX2  CULTURE, BLOOD (ROUTINE X 2)  CULTURE, BLOOD (ROUTINE X 2)  URINALYSIS, W/ REFLEX TO CULTURE (INFECTION SUSPECTED)  BRAIN NATRIURETIC PEPTIDE   LIPASE, BLOOD  CBC  CREATININE, SERUM  I-STAT CG4 LACTIC ACID, ED  I-STAT CG4 LACTIC ACID, ED  TROPONIN I (HIGH SENSITIVITY)    EKG: None  Radiology: DG Foot Complete Left Result Date: 10/19/2023 CLINICAL DATA:  Sepsis.  Possible osteomyelitis. EXAM: LEFT FOOT - COMPLETE 3+ VIEW COMPARISON:  10/17/2023 FINDINGS: Mild diffuse decreased bone mineralization is present. Evidence of previous amputation distal to the base of the second proximal phalanx unchanged. Degenerative changes over the midfoot and hindfoot as well as tibiotalar joint which are stable. Small vessel atherosclerotic disease is present. No evidence to suggest osteomyelitis. Minimal diffuse soft tissue swelling over the forefoot. IMPRESSION:  1. No acute findings. No evidence of osteomyelitis. 2. Degenerative changes as described. 3. Minimal diffuse soft tissue swelling over the forefoot. Electronically Signed   By: Toribio Agreste M.D.   On: 10/19/2023 09:13   DG Chest Port 1 View Result Date: 10/19/2023 CLINICAL DATA:  Questionable sepsis. EXAM: PORTABLE CHEST 1 VIEW COMPARISON:  04/14/2023 FINDINGS: Lungs are adequately inflated without focal airspace consolidation or effusion. Cardiomediastinal silhouette and remainder of the exam is unchanged. IMPRESSION: No active disease. Electronically Signed   By: Toribio Agreste M.D.   On: 10/19/2023 09:11   DG Foot Complete Left Result Date: 10/17/2023 Please see detailed radiograph report in office note.    Procedures   CRITICAL CARE Performed by: Lonni PARAS Trenell Moxey Total critical care time: 35 minutes Critical care time was exclusive of separately billable procedures and treating other patients. Critical care was necessary to treat or prevent imminent or life-threatening deterioration. Critical care was time spent personally by me on the following activities: development of treatment plan with patient and/or surrogate as well as nursing, discussions with consultants, evaluation  of patient's response to treatment, examination of patient, obtaining history from patient or surrogate, ordering and performing treatments and interventions, ordering and review of laboratory studies, ordering and review of radiographic studies, pulse oximetry and re-evaluation of patient's condition.  Medications Ordered in the ED  lactated ringers  infusion (has no administration in time range)  ceFEPIme  (MAXIPIME ) 2 g in sodium chloride  0.9 % 100 mL IVPB (has no administration in time range)  metroNIDAZOLE  (FLAGYL ) IVPB 500 mg (has no administration in time range)  vancomycin  (VANCOREADY) IVPB 2000 mg/400 mL (2,000 mg Intravenous New Bag/Given 10/19/23 0924)  lactated ringers  bolus 1,000 mL (1,000 mLs Intravenous New Bag/Given 10/19/23 0910)                                    Medical Decision Making Amount and/or Complexity of Data Reviewed Labs: ordered. Radiology: ordered.  Risk Prescription drug management. Decision regarding hospitalization.    Edgar Brewer is a 63 y.o. 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  According to patient, he is supposed to have more surgery on his left foot to remove more of the infection after he lost a toe from osteomyelitis and gangrene.  He says that it is scheduled for Thursday but over the last 24 hours he has been feeling worse.  He reports subjective fevers and chills and has had worsened pain in his left foot with shooting every now and then.  He reports also had some cough and fatigue and had some nausea and vomiting.  He denies chest pain or abdominal pain.  Denies a history of blood clots.  Denies any other trauma.  On exam, lungs were extremely coarse and oxygen saturation's were dipping into the 80s.  He was  placed on 2 L by EMS and we will keep him on this.  Has a wrap around his left foot but it does appear slightly swollen and erythematous and I do see the wound in the toe that is not draining at this time.  I do not feel crepitance but he does feel warm to the touch.  Otherwise he had good bowel sounds and his abdomen  was nontender.  Back nontender.  Patient is ill-appearing.  Patient's vital signs consistent with sepsis with tachycardia tachypnea and fever.  Patient also slightly hypoxic.  Patient placed on nasal cannula we will activate a code sepsis.  Will give broad-spectrum antibiotics.  With his recent procedure and the swelling of the left leg we will get a DVT study.  We will get chest x-ray and labs.  Will check for COVID flu/RSV and the ongoing pandemic.  He will get an x-ray of the foot to look for acutely worsening osteomyelitis and to look for subcutaneous gas.  He does tell me that he still makes some urine so we will check urinalysis and will hold on a CT PE study or CT abdomen pelvis with contrast as he is not having significant pain and I have less concern for PE and or concern for infection at this time.  Will need admission after workup for further management.     10:51 AM Workup continues to return.  X-ray does not show extensive osteomyelitis however again patient is scheduled to have more surgery and is having worsened pain.  Given his sepsis I suspect this is the source.  His x-ray did not show clear pneumonia and his viral swab is still in process.  Patient takes dialysis so his creatinine is expected to be elevated.  Troponin will be trended.  Will call for admission for sepsis with concern for either viral versus foot infection with intermittent new hypoxia.      Final diagnoses:  Sepsis, due to unspecified organism, unspecified whether acute organ dysfunction present [facility])  Hypoxia  Left foot pain     Clinical Impression: 1. Sepsis, due to unspecified organism,  unspecified whether acute organ dysfunction present (HCC)   2. Hypoxia   3. Left foot pain     Disposition: Admit  This note was prepared with assistance of Dragon voice recognition software. Occasional wrong-word or sound-a-like substitutions may have occurred due to the inherent limitations of voice recognition software.     Lasaundra Riche, Lonni PARAS, MD 10/19/23 1124

## 2023-10-20 ENCOUNTER — Other Ambulatory Visit (HOSPITAL_COMMUNITY): Payer: Self-pay

## 2023-10-20 DIAGNOSIS — M86172 Other acute osteomyelitis, left ankle and foot: Secondary | ICD-10-CM

## 2023-10-20 DIAGNOSIS — E118 Type 2 diabetes mellitus with unspecified complications: Secondary | ICD-10-CM | POA: Diagnosis not present

## 2023-10-20 DIAGNOSIS — Z992 Dependence on renal dialysis: Secondary | ICD-10-CM

## 2023-10-20 DIAGNOSIS — N186 End stage renal disease: Secondary | ICD-10-CM

## 2023-10-20 DIAGNOSIS — I739 Peripheral vascular disease, unspecified: Secondary | ICD-10-CM

## 2023-10-20 DIAGNOSIS — G629 Polyneuropathy, unspecified: Secondary | ICD-10-CM

## 2023-10-20 DIAGNOSIS — M869 Osteomyelitis, unspecified: Secondary | ICD-10-CM

## 2023-10-20 DIAGNOSIS — A419 Sepsis, unspecified organism: Secondary | ICD-10-CM | POA: Diagnosis not present

## 2023-10-20 LAB — GLUCOSE, CAPILLARY
Glucose-Capillary: 213 mg/dL — ABNORMAL HIGH (ref 70–99)
Glucose-Capillary: 181 mg/dL — ABNORMAL HIGH (ref 70–99)
Glucose-Capillary: 113 mg/dL — ABNORMAL HIGH (ref 70–99)

## 2023-10-20 MED ORDER — MUPIROCIN CALCIUM 2 % EX CREA
TOPICAL_CREAM | CUTANEOUS | Status: DC
  Administered 2023-11-03: 1 via TOPICAL
  Filled 2023-10-20 (×2): qty 15

## 2023-10-20 MED ORDER — HEPARIN SODIUM (PORCINE) 1000 UNIT/ML DIALYSIS
2500.0000 [IU] | INTRAMUSCULAR | Status: DC | PRN
  Administered 2023-10-20: 2500 [IU] via INTRAVENOUS_CENTRAL

## 2023-10-20 MED ORDER — PENTAFLUOROPROP-TETRAFLUOROETH EX AERO: 1.0000 | INHALATION_SPRAY | CUTANEOUS | Status: DC | PRN

## 2023-10-20 MED ORDER — VANCOMYCIN HCL IN DEXTROSE 1-5 GM/200ML-% IV SOLN
1000.0000 mg | INTRAVENOUS | Status: DC
  Administered 2023-10-20: 1000 mg via INTRAVENOUS

## 2023-10-20 MED ORDER — HEPARIN SODIUM (PORCINE) 1000 UNIT/ML DIALYSIS: 1000.0000 [IU] | INTRAMUSCULAR | Status: DC | PRN

## 2023-10-20 MED ORDER — HEPARIN SODIUM (PORCINE) 1000 UNIT/ML IJ SOLN
INTRAMUSCULAR | Status: AC
  Filled 2023-10-20: qty 3

## 2023-10-20 MED ORDER — LIDOCAINE-PRILOCAINE 2.5-2.5 % EX CREA: 1.0000 | TOPICAL_CREAM | CUTANEOUS | Status: DC | PRN

## 2023-10-20 MED ORDER — ANTICOAGULANT SODIUM CITRATE 4% (200MG/5ML) IV SOLN: 5.0000 mL | Status: DC | PRN

## 2023-10-20 MED ORDER — ALTEPLASE 2 MG IJ SOLR: 2.0000 mg | Freq: Once | INTRAMUSCULAR | Status: DC | PRN

## 2023-10-20 MED ORDER — LIDOCAINE HCL (PF) 1 % IJ SOLN: 5.0000 mL | INTRAMUSCULAR | Status: DC | PRN

## 2023-10-20 MED ORDER — VANCOMYCIN HCL IN DEXTROSE 1-5 GM/200ML-% IV SOLN
INTRAVENOUS | Status: AC
  Filled 2023-10-20: qty 200

## 2023-10-20 MED ORDER — PROSOURCE PLUS PO LIQD
30.0000 mL | Freq: Two times a day (BID) | ORAL | Status: DC
  Administered 2023-10-20 – 2023-11-07 (×27): 30 mL via ORAL
  Filled 2023-10-20 (×31): qty 30

## 2023-10-20 NOTE — Consult Note (Signed)
 PODIATRY CONSULTATION  NAME Edgar Brewer MRN [id number] DOB 01/03/1961 DOA 10/19/2023   Reason for consult:  Chief Complaint  Patient presents with   Altered Mental Status    Attending/Consulting physician:   History of present illness: Edgar Brewer is a 63 y.o. 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   The patient underwent a left toe amputation a couple of weeks ago due to gangrene. After the surgery, the patient went home and was self-managing the wound care. The patient visited the clinic for follow-up, where it was noted that the wound was producing pus. Approximately one week ago, the patient began experiencing pus and drainage from the amputation site. The patient was given a single antibiotic pill to take for one week, which was completed a couple of weeks ago. The patient's mother became concerned when the patient experienced difficulty standing and was shaking. This led to an ambulance being called late last night to bring the patient to the hospital.  Patient known to me from the office and recent hospitalization.  He was recently admitted in late August.  Had angiogram with intervention with Dr. Magda due to PAD on the left lower extremity.  Subsequently he underwent a left second toe amputation at the proximal phalanx base on September 13, 2023.  Unfortunately he dehisced the amputation site and there is concern for nonhealing as well as possible residual infection in the proximal phalanx base.  I saw him a few days ago and discussed inpatient admission versus outpatient surgery to debride the amputation site and remove the proximal phalanx base.  At the time he did not want to be admitted and wanted to proceed with outpatient surgery which I felt was reasonable given the clinical  appearance.  However he unfortunately developed worsening systemic signs of infection including weakness and chills that caused him to come in to the hospital yesterday.  We had been planning surgery next Thursday however this will be done more urgently.  I did discuss that there is significant evidence of lack of blood flow to the left foot in addition to residual infection.  We discussed plan to get an MRI and further determine surgical plan based on that.  Past Medical History:  Diagnosis Date   Acute on chronic combined systolic and diastolic CHF, NYHA class 1 (HCC) 01/13/2015   Allergy    Arthritis    Chronic kidney disease    MWF [facility].   Diabetes mellitus type 2 with complications (HCC)    Previous insulin , non-insulin  requiring noted 09/2018.  Complications include retinopathy, peripheral neuropathy.   GERD (gastroesophageal reflux disease)    at times   Head injury with loss of consciousness [facility])    History of kidney stones    Hypertension    Neuromuscular disorder (HCC)    Neuropathy    Pulmonary hypertension (HCC)    PA peak pressure 41 mmHg 03/04/16 echo   Stroke [facility])    no residual weakness   Varicose veins of lower extremities with complications 12/24/2014       Latest Ref Rng & Units 10/19/2023    2:49 PM 10/19/2023    9:00 AM 09/22/2023    5:01 AM  CBC  WBC 4.0 - 10.5 K/uL 7.9  8.6  7.2   Hemoglobin 13.0 -  17.0 g/dL 89.5  88.8  87.2   Hematocrit 39.0 - 52.0 % 32.7  34.2  38.4   Platelets 150 - 400 K/uL 199  208  243        Latest Ref Rng & Units 10/19/2023    2:49 PM 10/19/2023    9:00 AM 09/22/2023    7:05 AM  BMP  Glucose 70 - 99 mg/dL  897  818   BUN 8 - 23 mg/dL  27  62   Creatinine 9.38 - 1.24 mg/dL 0.63  1.26  86.19   Sodium 135 - 145 mmol/L  136  135   Potassium 3.5 - 5.1 mmol/L  5.2  5.9   Chloride 98 - 111 mmol/L  92  95   CO2 22 - 32 mmol/L  27  20   Calcium  8.9 - 10.3 mg/dL  9.8  9.6       Physical Exam: Lower Extremity Exam  Nonpalpable  DP and PT pulse on the left foot.  Of note the left hallux is now slightly dusky and cooler to touch than previously on exam.  Open ulceration at the second toe amputation site with necrotic fibrotic material in the wound bed.  Fibropurulent drainage with mild malodor present.  More erythema than prior extending to the midfoot.  Also with some erythema of the third toe.  Decreased sensation to the forefoot   On the right foot patient does have a superficial wound to the dorsal aspect of the hallux however this is stable and clean at this time.  No evidence of infection I removed a necrotic skin overlying area of blister in the office few days ago and appears very healthy and clean.         ASSESSMENT/PLAN OF CARE 62 y.o. 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  WBC 7.9 XR left foot: Prior amputation distal to base of second proximal phalanx, small vessel pedal arterial vascular calcifications, osseous erosions at the amputation margin of the proximal phalanx concerning for osteomyelitis per my evaluation  MRI left foot without contrast: Pending  -Discussed with patient and his mother who is present in the room that we will order an MRI and further evaluate the extent of his infection in the left foot.  I personally reviewed the angiogram results from Dr. Magda and he does have evidence of very limited arterial flow to the forefoot.  I am concerned he will not heal at that level.  Surgical plan would be debridement of the second toe amputation site with removal of the proximal phalanx versus transmetatarsal amputation.  Further surgical plan pending MRI result.  Possible OR tomorrow  depending on the timing of that result. - Continue IV abx broad spectrum pending further culture data - Anticoagulation: Okay to continue per primary or vascular recommendations - Wound care: Betadine gauze dressing applied to the left foot.  On the right foot recommend every other day antibiotic ointment and Band-Aid dressing to the right great toe orders entered - WB status: Weightbearing as tolerated preoperatively - Will continue to follow   Thank you for the consult.  Please contact me directly with any questions or concerns.  [name], DPM [facility]    [address] [place], [place] [address]                Office [phone]  Fax [phone]

## 2023-10-20 NOTE — Assessment & Plan Note (Signed)
-   Continue with Lantus  and SSI

## 2023-10-20 NOTE — Assessment & Plan Note (Signed)
-  Continue with home gabapentin

## 2023-10-20 NOTE — Hospital Course (Addendum)
 Edgar Brewer is a 63 y.o. male with PMH of 63 y.o. 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  Patient had amputation 2 weeks ago, noticed purulent discharge and was given 1 week of antibiotics as outpatient.  Patient was having chills so brought to ED On presentation febrile at 100.8, tachycardic and tachypneic, hypoxic requiring 2 L of oxygen.  Labs with potassium of 5.2, creatinine 8.73, lactic acid 5.6> 0.8,US  LLE W/O DVT. XR L foot w/o acute findings or OM.  Blood cultures were drawn. Patient was started on vancomycin , cefepime  and Flagyl .  Podiatry was consulted. Seen by podiatry,case was discussed with vascular surgery MRI >> postsurgical changes and incomplete wound healing dorsally, no organized fluid collection, there was nonspecific T2 hyperintensity within the remaining second proximal phalangeal base and second metatarsal head which can be either postsurgical or secondary to early osteomyelitis.  No other specific evidence of osteomyelitis noted. 9/29: Remained hemodynamically stable, patient had transmetatarsal potation left foot 9/29   Subjective: Seen and examined today He is alert awake resting comfortably, complains of pain on his left lower extremity Overnight afebrile BP stable on room air Labs reviewed-CBC with mild leukocytosis hemoglobin 10.7, BMP mild hyperkalemia BUN 68 creatinine 10.3  Assessment and plan:  Sepsis 2/2 surgical wound infection left foot S/p recent amputation of L 2nd toe through proximal phalanx. Podiatry was consulted-MRI for with postsurgical changes and incomplete wound healing dorsally, no organized fluid collection, there was nonspecific T2 hyperintensity within the remaining second proximal phalangeal base and second metatarsal head which can be either  postsurgical or secondary to early osteomyelitis.  No other specific evidence of osteomyelitis noted. patient had transmetatarsal potation left foot 9/29  Continue with vancomycin  and cefepime -culture pending from surgical site 9/29 Discussed with podiatry plan for PT eval today as per his mom not sure if he will be able to go home with NWB to LLE.  Will await for PT recommendation.  Planning for dressing change tomorrow   Diabetes mellitus type 2 with complications: Continue with Lantus  and SSI Recent Labs  Lab 10/19/23 1449 10/19/23 1723 10/23/23 1724 10/23/23 1829 10/23/23 2023 10/24/23 0427 10/24/23 0740  GLUCAP  --    < > 85 88 124* 322* 229*  HGBA1C 6.3*  --   --   --   --   --   --    < > = values in this interval not displayed.     ESRD on dialysis MWF Metabolic bone disease Mild hyperkalemia: Nephro following last HD 9/29.  Continue Renvela  Sensipar .  Add Lokelma  x 1   Neuropathy: Continue with home gabapentin    PAD Patient with history of significant PAD, will be high risk in terms of wound healing or progressive amputation. Podiatry is communicating with vascular surgery. Continue with home Crestor   Class I Obesity w/ Body mass index is 32.58 kg/m.: Will benefit with PCP follow-up, weight loss,healthy lifestyle and outpatient sleep eval if not done.  Mobility: PT Orders: Active  PT Follow up Rec:    DVT prophylaxis:  Code Status:   Code Status: Full Code Family Communication: plan of care discussed with patient at bedside. Patient status is: Remains hospitalized because of severity of illness Level of care: Med-Surg   Dispo: The patient is from: home alone  Anticipated disposition: TBD Objective: Vitals last 24 hrs: Vitals:   10/23/23 2020 10/23/23 2147 10/24/23 0423 10/24/23 0740  BP: (!) 127/49 126/64 (!) 118/51 133/64  Pulse: 91 95 86 87  Resp: 20 14 20 18   Temp: 98.8 F (37.1 C) 98.5 F (36.9 C)  97.8 F (36.6 C)  TempSrc: Oral    Oral  SpO2: 91% 90% 92% 94%  Weight:      Height:        Physical Examination: General exam: alert awake, oriented, older than stated age HEENT:Oral mucosa moist, Ear/Nose WNL grossly Respiratory system: Bilaterally clear BS,no use of accessory muscle Cardiovascular system: S1 & S2 +, No JVD. Gastrointestinal system: Abdomen soft,NT,ND, BS+ Nervous System: Alert, awake, moving all extremities,and following commands. Extremities: extremities warm, leg edema neg, left foot with dressing in place C/C/I Skin: No rashes,no icterus. MSK: Normal muscle bulk,tone, power   Medications reviewed:  Scheduled Meds:  (feeding supplement) PROSource Plus  30 mL Oral BID BM   Chlorhexidine  Gluconate Cloth  6 each Topical Q0600   cinacalcet   60 mg Oral Q T,Th,Sat-1800   darbepoetin (ARANESP ) injection - DIALYSIS  60 mcg Subcutaneous Q Mon-1800   gabapentin   100 mg Oral TID   insulin  aspart  0-9 Units Subcutaneous TID WC   insulin  glargine  50 Units Subcutaneous Daily   metroNIDAZOLE   500 mg Oral Q12H   multivitamin  1 tablet Oral QHS   mupirocin  cream   Topical QODAY   pantoprazole   40 mg Oral Daily   rosuvastatin   10 mg Oral Daily   sevelamer  carbonate  1,600 mg Oral TID WC   Continuous Infusions:  ceFEPime  (MAXIPIME ) IV 1 g (10/23/23 1850)   vancomycin      Diet: Diet Order             Diet Carb Modified Fluid consistency: Thin; Room service appropriate? Yes  Diet effective now

## 2023-10-20 NOTE — Assessment & Plan Note (Signed)
 Patient is on MWF schedule. - Nephrology was consulted

## 2023-10-20 NOTE — Progress Notes (Signed)
Pt refuses to wear oxygen. 

## 2023-10-20 NOTE — Progress Notes (Signed)
  Progress Note   Patient: Edgar Brewer FMW:[id number] DOB: 1961/01/12 DOA: 10/19/2023     1 DOS: the patient was seen and examined on 10/20/2023   Brief hospital course: Partly taken from H&P.  Reuben Knoblock is a 63 y.o. 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   Patient had amputation 2 weeks ago, noticed purulent discharge and was given 1 week of antibiotics as outpatient.  Patient was having chills so brought to ED  On presentation febrile at 100.8, tachycardic and tachypneic, hypoxic requiring 2 L of oxygen.  Labs with potassium of 5.2, creatinine 8.73, lactic acid 5.6> 0.8,US  LLE W/O DVT. XR L foot w/o acute findings or OM.  Blood cultures were drawn.  Patient was started on vancomycin , cefepime  and Flagyl .  Podiatry was consulted.  9/26: Temperature of 99.8, A1c of 6.3, CBG 213, BNP of 387 but patient is on dialysis, troponin 25>24, preliminary blood culture negative. Apparently further surgery was planned for upcoming Thursday by podiatry but patient was admitted due to worsening infection.  Podiatry ordered MRI, increased risk of nonhealing wound due to PAD.  Case was discussed with vascular surgery by podiatry.  Assessment and Plan: * Sepsis (HCC) Sepsis 2/2 surgical wound infection S/p recent amputation of L 2nd toe through proximal phalanx. Podiatry was consulted-they ordered MRI Further plan after imaging, patient might need either phalanx amputation or TMA. - Continue with broad-spectrum antibiotics -Continue with supportive care  Diabetes mellitus type 2 with complications (HCC) - Continue with Lantus  and SSI  ESRD on dialysis [facility]) Patient is on MWF schedule. - Nephrology was consulted  Neuropathy - Continue with home gabapentin   PAD (peripheral artery disease) Patient  with history of significant PAD, will be high risk in terms of wound healing or progressive amputation. Podiatry is communicating with vascular surgery. - Continue with home Crestor    Subjective: Patient was eating lunch when seen today.  Still having significant discharge from the foot wound.  Physical Exam: Vitals:   10/19/23 2030 10/20/23 0500 10/20/23 0726 10/20/23 1553  BP: (!) 125/59 (!) 184/159 118/67   Pulse: 92 96 97   Resp: 17 18 18    Temp: 99.2 F (37.3 C) 99.6 F (37.6 C) 99.8 F (37.7 C)   TempSrc: Oral Oral Oral   SpO2: 92% 96% 91%   Weight:    100.2 kg  Height:       General.  Obese gentleman, in no acute distress. Pulmonary.  Lungs clear bilaterally, normal respiratory effort. CV.  Regular rate and rhythm, no JVD, rub or murmur. Abdomen.  Soft, nontender, nondistended, BS positive. CNS.  Alert and oriented .  No focal neurologic deficit. Extremities.  Left foot with bandage Psychiatry.  Judgment and insight appears normal.   Data Reviewed: Prior data reviewed  Family Communication: Discussed with mother at bedside  Disposition: Status is: Inpatient Remains inpatient appropriate because: Severity of illness  Planned Discharge Destination: Home with Home Health  DVT prophylaxis.  Lovenox  Time spent: 50 minutes  This record has been created using Conservation officer, historic buildings. Errors have been sought and corrected,but may not always be located. Such creation errors do not reflect on the standard of care.   Author: Amaryllis Dare, MD 10/20/2023 4:28 PM  For on call review [email or web address].

## 2023-10-20 NOTE — Assessment & Plan Note (Signed)
 Patient with history of significant PAD, will be high risk in terms of wound healing or progressive amputation. Podiatry is communicating with vascular surgery. - Continue with home Crestor 

## 2023-10-20 NOTE — TOC CM/SW Note (Signed)
 Transition of Care [facility]) - Inpatient Brief Assessment   Patient Details  Name: Edgar Brewer MRN: [id number] Date of Birth: Nov 06, 1960  Transition of Care [facility]) CM/SW Contact:    Tom-Johnson, Harvest Muskrat, RN Phone Number: 10/20/2023, 4:09 PM   Clinical Narrative:  Patient presented to the ED with Surgical Wound Infection, admitted with Sepsis on IV abx. Patient recently underwent Lt 2nd Toe Amputation. Podiatry following.  Form home with his mother, Edgar Brewer who is at bedside. On disability, hx of ESRD on MWF schedule and Edgar Brewer transports to and from HD.  Has a cane, walker and shower seat at home.  PCP is Katrinka Garnette KIDD, MD and uses [facility] on [address].   No ICM needs or recommendations noted at this time.  Patient not Medically ready for discharge.  CM will continue to follow as patient progresses with care towards discharge.                 Transition of Care Asessment: Insurance and Status: Insurance coverage has been reviewed Patient has primary care physician: Yes Home environment has been reviewed: Yes Prior level of function:: Modified Independent Prior/Current Home Services: No current home services Social Drivers of Health Review: SDOH reviewed no interventions necessary Readmission risk has been reviewed: Yes Transition of care needs: no transition of care needs at this time

## 2023-10-20 NOTE — Progress Notes (Signed)
 [facility] Progress Note   Subjective:   Seen in room. No CP/dyspnea. Is having L foot pain occasionally. For HD today.  Objective Vitals:   10/19/23 1832 10/19/23 2030 10/20/23 0500 10/20/23 0726  BP: 115/61 (!) 125/59 (!) 184/159 118/67  Pulse: 96 92 96 97  Resp:  17 18 18   Temp:  99.2 F (37.3 C) 99.6 F (37.6 C) 99.8 F (37.7 C)  TempSrc:  Oral Oral Oral  SpO2: 91% 92% 96% 91%  Weight:      Height:       Physical Exam General: Well appearing man, NAD. Room air Heart: RRR Lungs: CTAB Abdomen: soft Extremities: no RLE edema, LLE with erythema, foot bandaged Dialysis Access: LUE AVF +t/b  Additional Objective Labs: Basic Metabolic Panel: Recent Labs  Lab 10/19/23 0900 10/19/23 1449  NA 136  --   K 5.2*  --   CL 92*  --   CO2 27  --   GLUCOSE 102*  --   BUN 27*  --   CREATININE 8.73* 9.36*  CALCIUM  9.8  --    Liver Function Tests: Recent Labs  Lab 10/19/23 0900  AST 19  ALT 19  ALKPHOS 255*  BILITOT 1.1  PROT 7.1  ALBUMIN 3.0*   Recent Labs  Lab 10/19/23 0900  LIPASE 14   CBC: Recent Labs  Lab 10/19/23 0900 10/19/23 1449  WBC 8.6 7.9  NEUTROABS 7.5  --   HGB 11.1* 10.4*  HCT 34.2* 32.7*  MCV 97.7 98.8  PLT 208 199   Blood Culture    Component Value Date/Time   SDES BLOOD RIGHT ARM 10/19/2023 0905   SPECREQUEST  10/19/2023 0905    BOTTLES DRAWN AEROBIC AND ANAEROBIC Blood Culture adequate volume   CULT  10/19/2023 0905    NO GROWTH < 24 HOURS Performed at [facility], [address] [place], [place] [address]    REPTSTATUS PENDING 10/19/2023 9094   Studies/Results: VAS US  LOWER EXTREMITY VENOUS (DVT) (ONLY [facility] & [facility]) Result Date: 10/19/2023  Lower Venous DVT Study Patient Name:  [facility] BELLUOMINI  Date of Exam:   10/19/2023 Medical Rec #: [id number]     Accession #:    [id number] Date of Birth: 11-Feb-1960     Patient Gender: M Patient Age:   20 years Exam Location:  [facility] Procedure:      VAS US  LOWER  EXTREMITY VENOUS (DVT) Referring Phys: LONNI SAKAI --------------------------------------------------------------------------------  Indications: Swelling, Edema, Erythema, and ulceration.  Limitations: Body habitus and poor ultrasound/tissue interface. Comparison Study: Previous study on 4.24.2022. Performing Technologist: Edilia Elden Appl  Examination Guidelines: A complete evaluation includes B-mode imaging, spectral Doppler, color Doppler, and power Doppler as needed of all accessible portions of each vessel. Bilateral testing is considered an integral part of a complete examination. Limited examinations for reoccurring indications may be performed as noted. The reflux portion of the exam is performed with the patient in reverse Trendelenburg.  +-----+---------------+---------+-----------+----------+--------------+ RIGHTCompressibilityPhasicitySpontaneityPropertiesThrombus Aging +-----+---------------+---------+-----------+----------+--------------+ CFV  Full           Yes      Yes                                 +-----+---------------+---------+-----------+----------+--------------+ SFJ  Full           Yes      Yes                                 +-----+---------------+---------+-----------+----------+--------------+   +---------+---------------+---------+-----------+----------+--------------+  LEFT     CompressibilityPhasicitySpontaneityPropertiesThrombus Aging +---------+---------------+---------+-----------+----------+--------------+ CFV      Full           Yes      Yes                                 +---------+---------------+---------+-----------+----------+--------------+ SFJ      Full           Yes      Yes                                 +---------+---------------+---------+-----------+----------+--------------+ FV Prox  Full                                                         +---------+---------------+---------+-----------+----------+--------------+ FV Mid   Full                                                        +---------+---------------+---------+-----------+----------+--------------+ FV DistalFull                                                        +---------+---------------+---------+-----------+----------+--------------+ PFV      Full                                                        +---------+---------------+---------+-----------+----------+--------------+ POP      Full           Yes      Yes                                 +---------+---------------+---------+-----------+----------+--------------+ PTV      Full                                                        +---------+---------------+---------+-----------+----------+--------------+ PERO     Full                                                        +---------+---------------+---------+-----------+----------+--------------+    Summary: RIGHT: - No evidence of common femoral vein obstruction.   LEFT: - There is no evidence of deep vein thrombosis in the lower extremity.  - No cystic structure found in the popliteal fossa.  *See table(s) above for measurements and observations. Electronically signed by Lonni Gaskins MD  on 10/19/2023 at 2:26:06 PM.    Final    DG Foot Complete Left Result Date: 10/19/2023 CLINICAL DATA:  Sepsis.  Possible osteomyelitis. EXAM: LEFT FOOT - COMPLETE 3+ VIEW COMPARISON:  10/17/2023 FINDINGS: Mild diffuse decreased bone mineralization is present. Evidence of previous amputation distal to the base of the second proximal phalanx unchanged. Degenerative changes over the midfoot and hindfoot as well as tibiotalar joint which are stable. Small vessel atherosclerotic disease is present. No evidence to suggest osteomyelitis. Minimal diffuse soft tissue swelling over the forefoot. IMPRESSION: 1. No acute findings. No evidence of osteomyelitis.  2. Degenerative changes as described. 3. Minimal diffuse soft tissue swelling over the forefoot. Electronically Signed   By: Toribio Agreste M.D.   On: 10/19/2023 09:13   DG Chest Port 1 View Result Date: 10/19/2023 CLINICAL DATA:  Questionable sepsis. EXAM: PORTABLE CHEST 1 VIEW COMPARISON:  04/14/2023 FINDINGS: Lungs are adequately inflated without focal airspace consolidation or effusion. Cardiomediastinal silhouette and remainder of the exam is unchanged. IMPRESSION: No active disease. Electronically Signed   By: Toribio Agreste M.D.   On: 10/19/2023 09:11   Medications:  ceFEPime  (MAXIPIME ) IV     vancomycin       Chlorhexidine  Gluconate Cloth  6 each Topical Q0600   cinacalcet   60 mg Oral Q T,Th,Sat-1800   enoxaparin  (LOVENOX ) injection  30 mg Subcutaneous Q24H   gabapentin   100 mg Oral TID   insulin  aspart  0-9 Units Subcutaneous TID WC   insulin  glargine  50 Units Subcutaneous Daily   metroNIDAZOLE   500 mg Oral Q12H   multivitamin  1 tablet Oral QHS   pantoprazole   40 mg Oral Daily   rosuvastatin   10 mg Oral Daily   sevelamer  carbonate  1,600 mg Oral TID WC    Dialysis Orders [facility] MWF BFR 400 EDW 96.8 kg 2K/2.5Ca AVF  -Heparin  2600 -Calcitriol 1.75 TIW   Assessment/Plan: Sepsis/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 On IV Vanc/Cefepime . Blood cultures NGTD. Podiatry consulted. Per primary team.  ESRD.  Continue usual MWF HD - for HD today. Hypertension/volume. Blood pressure variable, min LE edema. UF to EDW.  Anemia. Hgb 10.4, monitor without ESA for now. Metabolic bone disease.  Ca ok, Phos pending. continue home binders/cinacalcet . Nutrition: Alb low, adding supps. T2DM: Insulin  per primary. DVT prophylaxis: Prefer heparin  over Lovenox  in ESRD patient.   Izetta Boehringer, PA-C 10/20/2023, 9:48 AM  [employer]

## 2023-10-20 NOTE — Progress Notes (Signed)
 Pt refuses to wear oxygen and told me to go away.

## 2023-10-20 NOTE — Assessment & Plan Note (Signed)
 Sepsis 2/2 surgical wound infection S/p recent amputation of L 2nd toe through proximal phalanx. Podiatry was consulted-they ordered MRI Further plan after imaging, patient might need either phalanx amputation or TMA. - Continue with broad-spectrum antibiotics -Continue with supportive care

## 2023-10-21 ENCOUNTER — Inpatient Hospital Stay (HOSPITAL_COMMUNITY): Payer: Medicare (Managed Care)

## 2023-10-21 DIAGNOSIS — M86172 Other acute osteomyelitis, left ankle and foot: Secondary | ICD-10-CM | POA: Diagnosis not present

## 2023-10-21 DIAGNOSIS — A419 Sepsis, unspecified organism: Secondary | ICD-10-CM | POA: Diagnosis not present

## 2023-10-21 DIAGNOSIS — N186 End stage renal disease: Secondary | ICD-10-CM | POA: Diagnosis not present

## 2023-10-21 DIAGNOSIS — E118 Type 2 diabetes mellitus with unspecified complications: Secondary | ICD-10-CM | POA: Diagnosis not present

## 2023-10-21 LAB — GLUCOSE, CAPILLARY
Glucose-Capillary: 96 mg/dL (ref 70–99)
Glucose-Capillary: 125 mg/dL — ABNORMAL HIGH (ref 70–99)
Glucose-Capillary: 129 mg/dL — ABNORMAL HIGH (ref 70–99)
Glucose-Capillary: 161 mg/dL — ABNORMAL HIGH (ref 70–99)

## 2023-10-21 NOTE — Assessment & Plan Note (Signed)
 Patient is on MWF schedule. - Nephrology was consulted

## 2023-10-21 NOTE — Progress Notes (Signed)
 Progress Note   Patient: Edgar Brewer FMW:[id number] DOB: 01-Jul-1960 DOA: 10/19/2023     2 DOS: the patient was seen and examined on 10/21/2023   Brief hospital course: Partly taken from H&P.  Kenna Kirn is a 63 y.o. 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   Patient had amputation 2 weeks ago, noticed purulent discharge and was given 1 week of antibiotics as outpatient.  Patient was having chills so brought to ED  On presentation febrile at 100.8, tachycardic and tachypneic, hypoxic requiring 2 L of oxygen.  Labs with potassium of 5.2, creatinine 8.73, lactic acid 5.6> 0.8,US  LLE W/O DVT. XR L foot w/o acute findings or OM.  Blood cultures were drawn.  Patient was started on vancomycin , cefepime  and Flagyl .  Podiatry was consulted.  9/26: Temperature of 99.8, A1c of 6.3, CBG 213, BNP of 387 but patient is on dialysis, troponin 25>24, preliminary blood culture negative. Apparently further surgery was planned for upcoming Thursday by podiatry but patient was admitted due to worsening infection.  Podiatry ordered MRI, increased risk of nonhealing wound due to PAD.  Case was discussed with vascular surgery by podiatry.  9/27: Remained afebrile with stable vitals, MRI for with postsurgical changes and incomplete wound healing dorsally, no organized fluid collection, there was nonspecific T2 hyperintensity within the remaining second proximal phalangeal base and second metatarsal head which can be either postsurgical or secondary to early osteomyelitis.  No other specific evidence of osteomyelitis noted. Awaiting podiatry decision regarding further surgery.  Assessment and Plan: * Sepsis (HCC) Sepsis 2/2 surgical wound infection S/p recent amputation of L 2nd toe through proximal phalanx. Podiatry  was consulted-MRI for with postsurgical changes and incomplete wound healing dorsally, no organized fluid collection, there was nonspecific T2 hyperintensity within the remaining second proximal phalangeal base and second metatarsal head which can be either postsurgical or secondary to early osteomyelitis.  No other specific evidence of osteomyelitis noted. -Awaiting further plan by podiatry - Continue with broad-spectrum antibiotics -Continue with supportive care  Diabetes mellitus type 2 with complications (HCC) CBG within goal - Continue with Lantus  and SSI  ESRD on dialysis [facility]) Patient is on MWF schedule. - Nephrology was consulted  Neuropathy - Continue with home gabapentin   PAD (peripheral artery disease) Patient with history of significant PAD, will be high risk in terms of wound healing or progressive amputation. Podiatry is communicating with vascular surgery. - Continue with home Crestor    Subjective: Patient was sitting at the side of bed when seen today.  Drainage seems improving.  Denies any pain.  Physical Exam: Vitals:   10/20/23 2010 10/20/23 2113 10/21/23 0729 10/21/23 1014  BP: (!) 101/57 (!) 112/57 (!) 120/54   Pulse: 86 87 83   Resp: (!) 26 18    Temp: 98.8 F (37.1 C) 99.1 F (37.3 C) 98 F (36.7 C)   TempSrc: Oral Oral Oral   SpO2: 94% 90%  95%  Weight:      Height:       General.  Obese gentleman, in no acute distress. Pulmonary.  Lungs clear bilaterally, normal respiratory effort. CV.  Regular rate and rhythm, no JVD, rub or murmur. Abdomen.  Soft, nontender, nondistended, BS positive. CNS.  Alert and oriented .  No focal neurologic deficit. Extremities.  No edema, pulses  intact and symmetrical. Psychiatry.  Judgment and insight appears normal.   Data Reviewed: Prior data reviewed  Family Communication: Discussed with patient  Disposition: Status is: Inpatient Remains inpatient appropriate because: Severity of illness  Planned Discharge  Destination: Home with Home Health  DVT prophylaxis.  Lovenox  Time spent: 50 minutes  This record has been created using Conservation officer, historic buildings. Errors have been sought and corrected,but may not always be located. Such creation errors do not reflect on the standard of care.   Author: Amaryllis Dare, MD 10/21/2023 1:48 PM  For on call review [email or web address].

## 2023-10-21 NOTE — Progress Notes (Signed)
 PODIATRY PROGRESS NOTE Patient Name: Edgar Brewer  DOB 1960/10/29 DOA 10/19/2023  Hospital Day: 3  Assessment:  63 y.o. 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 .  AF, VSS  Imaging: MRI left foot 9/27 IMPRESSION: 1. Postsurgical changes related to recent 2nd toe amputation with possible incomplete wound healing dorsally. No organized fluid collection identified. 2. Nonspecific marrow T2 hyperintensity within the remaining 2nd proximal phalangeal base and 2nd metatarsal head with a small to moderate effusion of the 2nd metatarsophalangeal joint. Findings are nonspecific and could be postsurgical or secondary to early osteomyelitis. 3. No evidence of osteomyelitis within the 1st, 3rd, 4th or 5th rays. 4. Mild nonspecific forefoot muscular edema. 5. Mild degenerative changes in the midfoot and 1st metatarsophalangeal joint.     Electronically Signed   By: Elsie Perone M.D.   On: 10/21/2023 10:42 Plan:  - Case discussed with Dr. Malvin - Findings discussed with patient - Angiogram left lower extremity showing limited arterial flow to the left forefoot concerning for wound healing potential - Plan for surgery Monday with Dr. Malvin, surgical site debridement with removal of 2nd proximal phalanx vs left foot transmetatarsal amputation. Did discuss that based on vascular status the transmetarsal amputation is more likely -Betadine wet to dry dressing applied to the left foot. Continue this daily. Continue right foot antibiotic ointment and bandaid every other day. Will continue to follow        Ethan LITTIE Saddler, DPM [facility]    Subjective:  Patient seen  resting comfortably at bedside.  Intermittent shooting pain left foot at left second toe amputation site.  Otherwise feeling well.  Objective:   Vitals:   10/21/23 1014 10/21/23 1600  BP:  118/65  Pulse:  96  Resp:  18  Temp:  97.6 F (36.4 C)  SpO2: 95% 95%       Latest Ref Rng & Units 10/19/2023    2:49 PM 10/19/2023    9:00 AM 09/22/2023    5:01 AM  CBC  WBC 4.0 - 10.5 K/uL 7.9  8.6  7.2   Hemoglobin 13.0 - 17.0 g/dL 89.5  88.8  87.2   Hematocrit 39.0 - 52.0 % 32.7  34.2  38.4   Platelets 150 - 400 K/uL 199  208  243        Latest Ref Rng & Units 10/19/2023    2:49 PM 10/19/2023    9:00 AM 09/22/2023    7:05 AM  BMP  Glucose 70 - 99 mg/dL  897  818   BUN 8 - 23 mg/dL  27  62   Creatinine 9.38 - 1.24 mg/dL 0.63  1.26  86.19   Sodium 135 - 145 mmol/L  136  135   Potassium 3.5 - 5.1 mmol/L  5.2  5.9   Chloride 98 - 111 mmol/L  92  95   CO2 22 - 32 mmol/L  27  20   Calcium  8.9 - 10.3 mg/dL  9.8  9.6     General: AAOx3, NAD  Lower Extremity Exam  DP and PT pulses nonpalpable left foot.  Forefoot cool to touch.  Slightly dusky appearance to the hallux, this is  similar to the remaining digits and forefoot.  Second toe amputation site ulceration present with fibronecrotic base.  No significant malodor.  Minimal to no bleeding present, fibrous slough present.  Erythema present about the forefoot.  Right dorsal hallux wound stable.  Decreased sensation to forefoot.   Radiology:  Results reviewed. See assessment for pertinent imaging results

## 2023-10-21 NOTE — Assessment & Plan Note (Signed)
 Sepsis 2/2 surgical wound infection S/p recent amputation of L 2nd toe through proximal phalanx. Podiatry was consulted-MRI for with postsurgical changes and incomplete wound healing dorsally, no organized fluid collection, there was nonspecific T2 hyperintensity within the remaining second proximal phalangeal base and second metatarsal head which can be either postsurgical or secondary to early osteomyelitis.  No other specific evidence of osteomyelitis noted. -Going back to the OR tomorrow with podiatry - Continue with broad-spectrum antibiotics -Continue with supportive care

## 2023-10-21 NOTE — Assessment & Plan Note (Signed)
 CBG within goal - Continue with Lantus  and SSI

## 2023-10-21 NOTE — Progress Notes (Signed)
  [facility] Progress Note   Subjective:   Seen in room - denies CP/dyspnea. S/p HD yesterday - did fine with 2.5L off. NPO this AM, awaiting MRI foot read and may need surgery per podiatry note.  Objective Vitals:   10/20/23 1951 10/20/23 2010 10/20/23 2113 10/21/23 0729  BP:  (!) 101/57 (!) 112/57 (!) 120/54  Pulse: 87 86 87 83  Resp: (!) 25 (!) 26 18   Temp:  98.8 F (37.1 C) 99.1 F (37.3 C) 98 F (36.7 C)  TempSrc:  Oral Oral Oral  SpO2:  94% 90% 95%  Weight:      Height:       Physical Exam General: Well appearing man, NAD. Room air Heart: RRR Lungs: CTAB Abdomen: soft Extremities: no RLE edema, LLE with erythema, foot bandaged Dialysis Access: LUE AVF +t/b   Additional Objective Labs: Basic Metabolic Panel: Recent Labs  Lab 10/19/23 0900 10/19/23 1449  NA 136  --   K 5.2*  --   CL 92*  --   CO2 27  --   GLUCOSE 102*  --   BUN 27*  --   CREATININE 8.73* 9.36*  CALCIUM  9.8  --    Liver Function Tests: Recent Labs  Lab 10/19/23 0900  AST 19  ALT 19  ALKPHOS 255*  BILITOT 1.1  PROT 7.1  ALBUMIN 3.0*   Recent Labs  Lab 10/19/23 0900  LIPASE 14   CBC: Recent Labs  Lab 10/19/23 0900 10/19/23 1449  WBC 8.6 7.9  NEUTROABS 7.5  --   HGB 11.1* 10.4*  HCT 34.2* 32.7*  MCV 97.7 98.8  PLT 208 199   Blood Culture    Component Value Date/Time   SDES BLOOD RIGHT ARM 10/19/2023 0905   SPECREQUEST  10/19/2023 0905    BOTTLES DRAWN AEROBIC AND ANAEROBIC Blood Culture adequate volume   CULT  10/19/2023 0905    NO GROWTH 2 DAYS Performed at [facility], [address] [place], [place] [address]    REPTSTATUS PENDING 10/19/2023 9094   Studies/Results: VAS US  LOWER EXTREMITY VENOUS (DVT) (ONLY [facility] & [facility]) Result Date: 10/19/2023  Lower Venous DVT Study Patient Name:  Edgar Brewer  Date of Exam:   10/19/2023 Medical Rec #: [id number]     Accession #:    [id number] Date of Birth: 06/14/1960     Patient Gender: M Patient Age:   63  years Exam Location:  [facility] Procedure:      VAS US  LOWER EXTREMITY VENOUS (DVT) Referring Phys: LONNI SAKAI --------------------------------------------------------------------------------  Indications: Swelling, Edema, Erythema, and ulceration.  Limitations: Body habitus and poor ultrasound/tissue interface. Comparison Study: Previous study on 4.24.2022. Performing Technologist: Edilia Elden Appl  Examination Guidelines: A complete evaluation includes B-mode imaging, spectral Doppler, color Doppler, and power Doppler as needed of all accessible portions of each vessel. Bilateral testing is considered an integral part of a complete examination. Limited examinations for reoccurring indications may be performed as noted. The reflux portion of the exam is performed with the patient in reverse Trendelenburg.  +-----+---------------+---------+-----------+----------+--------------+ RIGHTCompressibilityPhasicitySpontaneityPropertiesThrombus Aging +-----+---------------+---------+-----------+----------+--------------+ CFV  Full           Yes      Yes                                 +-----+---------------+---------+-----------+----------+--------------+ SFJ  Full           Yes  Yes                                 +-----+---------------+---------+-----------+----------+--------------+   +---------+---------------+---------+-----------+----------+--------------+ LEFT     CompressibilityPhasicitySpontaneityPropertiesThrombus Aging +---------+---------------+---------+-----------+----------+--------------+ CFV      Full           Yes      Yes                                 +---------+---------------+---------+-----------+----------+--------------+ SFJ      Full           Yes      Yes                                 +---------+---------------+---------+-----------+----------+--------------+ FV Prox  Full                                                         +---------+---------------+---------+-----------+----------+--------------+ FV Mid   Full                                                        +---------+---------------+---------+-----------+----------+--------------+ FV DistalFull                                                        +---------+---------------+---------+-----------+----------+--------------+ PFV      Full                                                        +---------+---------------+---------+-----------+----------+--------------+ POP      Full           Yes      Yes                                 +---------+---------------+---------+-----------+----------+--------------+ PTV      Full                                                        +---------+---------------+---------+-----------+----------+--------------+ PERO     Full                                                        +---------+---------------+---------+-----------+----------+--------------+    Summary: RIGHT: - No evidence of common femoral vein obstruction.   LEFT: -  There is no evidence of deep vein thrombosis in the lower extremity.  - No cystic structure found in the popliteal fossa.  *See table(s) above for measurements and observations. Electronically signed by Lonni Gaskins MD on 10/19/2023 at 2:26:06 PM.    Final    Medications:  ceFEPime  (MAXIPIME ) IV     vancomycin  1,000 mg (10/20/23 1834)    (feeding supplement) PROSource Plus  30 mL Oral BID BM   Chlorhexidine  Gluconate Cloth  6 each Topical Q0600   cinacalcet   60 mg Oral Q T,Th,Sat-1800   enoxaparin  (LOVENOX ) injection  30 mg Subcutaneous Q24H   gabapentin   100 mg Oral TID   insulin  aspart  0-9 Units Subcutaneous TID WC   insulin  glargine  50 Units Subcutaneous Daily   metroNIDAZOLE   500 mg Oral Q12H   multivitamin  1 tablet Oral QHS   mupirocin  cream   Topical QODAY   pantoprazole   40 mg Oral Daily   rosuvastatin   10 mg Oral Daily   sevelamer   carbonate  1,600 mg Oral TID WC    Dialysis Orders NW MWF BFR 400 EDW 96.8 kg 2K/2.5Ca AVF  -Heparin  2600 -Calcitriol 1.75 TIW   Assessment/Plan: Sepsis/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 On IV Vanc/Cefepime . Blood cultures NGTD. Podiatry consulted, s/p MRI - may need debridement v. amputation today. ESRD.  Continue usual MWF HD - next HD Monday 9/29. Hypertension/volume. BP controlled, min LE edema. Keeping EDW same for now. Anemia. Hgb 10.4, monitor without ESA for now. Metabolic bone disease.  Ca ok, Phos pending. continue home binders/cinacalcet . Nutrition: Alb low, continue supplements. T2DM: Insulin  per primary. DVT prophylaxis: Prefer heparin  over Lovenox  in ESRD patient.   Izetta Boehringer, PA-C 10/21/2023, 9:16 AM  [employer]

## 2023-10-21 NOTE — Assessment & Plan Note (Signed)
 Patient with history of significant PAD, will be high risk in terms of wound healing or progressive amputation. Podiatry is communicating with vascular surgery. - Continue with home Crestor 

## 2023-10-21 NOTE — Plan of Care (Signed)
   Problem: Coping: Goal: Ability to adjust to condition or change in health will improve Outcome: Progressing   Problem: Metabolic: Goal: Ability to maintain appropriate glucose levels will improve Outcome: Progressing   Problem: Nutritional: Goal: Maintenance of adequate nutrition will improve Outcome: Progressing

## 2023-10-22 DIAGNOSIS — E118 Type 2 diabetes mellitus with unspecified complications: Secondary | ICD-10-CM | POA: Diagnosis not present

## 2023-10-22 DIAGNOSIS — A419 Sepsis, unspecified organism: Secondary | ICD-10-CM | POA: Diagnosis not present

## 2023-10-22 DIAGNOSIS — N186 End stage renal disease: Secondary | ICD-10-CM | POA: Diagnosis not present

## 2023-10-22 DIAGNOSIS — M86172 Other acute osteomyelitis, left ankle and foot: Secondary | ICD-10-CM | POA: Diagnosis not present

## 2023-10-22 LAB — GLUCOSE, CAPILLARY
Glucose-Capillary: 90 mg/dL (ref 70–99)
Glucose-Capillary: 137 mg/dL — ABNORMAL HIGH (ref 70–99)
Glucose-Capillary: 122 mg/dL — ABNORMAL HIGH (ref 70–99)

## 2023-10-22 NOTE — Plan of Care (Signed)
  Problem: Education: Goal: Ability to describe self-care measures that may prevent or decrease complications (Diabetes Survival Skills Education) will improve Outcome: Progressing Goal: Individualized Educational Video(s) Outcome: Progressing   Problem: Coping: Goal: Ability to adjust to condition or change in health will improve Outcome: Progressing   Problem: Health Behavior/Discharge Planning: Goal: Ability to identify and utilize available resources and services will improve Outcome: Progressing Goal: Ability to manage health-related needs will improve Outcome: Progressing   Problem: Metabolic: Goal: Ability to maintain appropriate glucose levels will improve Outcome: Progressing

## 2023-10-22 NOTE — Progress Notes (Signed)
 Progress Note   Patient: Edgar Brewer FMW:[id number] DOB: 1961-01-18 DOA: 10/19/2023     3 DOS: the patient was seen and examined on 10/22/2023   Brief hospital course: Partly taken from H&P.  Edgar Brewer is a 63 y.o. 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   Patient had amputation 2 weeks ago, noticed purulent discharge and was given 1 week of antibiotics as outpatient.  Patient was having chills so brought to ED  On presentation febrile at 100.8, tachycardic and tachypneic, hypoxic requiring 2 L of oxygen.  Labs with potassium of 5.2, creatinine 8.73, lactic acid 5.6> 0.8,US  LLE W/O DVT. XR L foot w/o acute findings or OM.  Blood cultures were drawn.  Patient was started on vancomycin , cefepime  and Flagyl .  Podiatry was consulted.  9/26: Temperature of 99.8, A1c of 6.3, CBG 213, BNP of 387 but patient is on dialysis, troponin 25>24, preliminary blood culture negative. Apparently further surgery was planned for upcoming Thursday by podiatry but patient was admitted due to worsening infection.  Podiatry ordered MRI, increased risk of nonhealing wound due to PAD.  Case was discussed with vascular surgery by podiatry.  9/27: Remained afebrile with stable vitals, MRI for with postsurgical changes and incomplete wound healing dorsally, no organized fluid collection, there was nonspecific T2 hyperintensity within the remaining second proximal phalangeal base and second metatarsal head which can be either postsurgical or secondary to early osteomyelitis.  No other specific evidence of osteomyelitis noted. Awaiting podiatry decision regarding further surgery.  9/28: Hemodynamically stable, going to the OR with podiatry tomorrow for TMA versus further debridement of current amputation site.  Lovenox  is  being held for surgery.  Assessment and Plan: * Sepsis (HCC) Sepsis 2/2 surgical wound infection S/p recent amputation of L 2nd toe through proximal phalanx. Podiatry was consulted-MRI for with postsurgical changes and incomplete wound healing dorsally, no organized fluid collection, there was nonspecific T2 hyperintensity within the remaining second proximal phalangeal base and second metatarsal head which can be either postsurgical or secondary to early osteomyelitis.  No other specific evidence of osteomyelitis noted. -Going back to the OR tomorrow with podiatry - Continue with broad-spectrum antibiotics -Continue with supportive care  Diabetes mellitus type 2 with complications (HCC) CBG within goal - Continue with Lantus  and SSI  ESRD on dialysis [facility]) Patient is on MWF schedule. - Nephrology was consulted  Neuropathy - Continue with home gabapentin   PAD (peripheral artery disease) Patient with history of significant PAD, will be high risk in terms of wound healing or progressive amputation. Podiatry is communicating with vascular surgery. - Continue with home Crestor    Subjective: Patient was seen and examined today.  No new concern.  He was aware about his surgery tomorrow.  Physical Exam: Vitals:   10/21/23 2048 10/22/23 0420 10/22/23 0500 10/22/23 0752  BP: (!) 126/50 (!) 119/56  (!) 112/57  Pulse: 93 84  85  Resp: 18 18    Temp: 98.8 F (37.1 C) 98.9 F (37.2 C)  98.2 F (36.8 C)  TempSrc:  Oral  Oral  SpO2: 91% 93%  91%  Weight:   98.4 kg   Height:       General.  Obese gentleman, in no acute distress. Pulmonary.  Lungs clear bilaterally, normal respiratory effort. CV.  Regular rate and  rhythm, no JVD, rub or murmur. Abdomen.  Soft, nontender, nondistended, BS positive. CNS.  Alert and oriented .  No focal neurologic deficit. Extremities.  No edema,pulses intact and symmetrical. Psychiatry.  Judgment and insight appears normal.    Data Reviewed: Prior  data reviewed  Family Communication: Discussed with patient  Disposition: Status is: Inpatient Remains inpatient appropriate because: Severity of illness  Planned Discharge Destination: Home with Home Health  DVT prophylaxis.  Lovenox  Time spent: 50 minutes  This record has been created using Conservation officer, historic buildings. Errors have been sought and corrected,but may not always be located. Such creation errors do not reflect on the standard of care.   Author: Amaryllis Dare, MD 10/22/2023 1:23 PM  For on call review [email or web address].

## 2023-10-22 NOTE — Progress Notes (Signed)
 [facility] Progress Note   Subjective:   Seen in room - feels ok. No CP/dyspnea. Per notes, will be going for surgery tomorrow - either prox 2nd amputation v. TMA. He is also due for dialysis tomorrow.  Objective Vitals:   10/21/23 2048 10/22/23 0420 10/22/23 0500 10/22/23 0752  BP: (!) 126/50 (!) 119/56  (!) 112/57  Pulse: 93 84  85  Resp: 18 18    Temp: 98.8 F (37.1 C) 98.9 F (37.2 C)  98.2 F (36.8 C)  TempSrc:  Oral  Oral  SpO2: 91% 93%  91%  Weight:   98.4 kg   Height:       Physical Exam General: Well appearing man, NAD. Room air Heart: RRR Lungs: CTAB Abdomen: soft Extremities: no RLE edema, LLE with erythema, foot bandaged Dialysis Access: LUE AVF +t/b  Additional Objective Labs: Basic Metabolic Panel: Recent Labs  Lab 10/19/23 0900 10/19/23 1449  NA 136  --   K 5.2*  --   CL 92*  --   CO2 27  --   GLUCOSE 102*  --   BUN 27*  --   CREATININE 8.73* 9.36*  CALCIUM  9.8  --    Liver Function Tests: Recent Labs  Lab 10/19/23 0900  AST 19  ALT 19  ALKPHOS 255*  BILITOT 1.1  PROT 7.1  ALBUMIN 3.0*   Recent Labs  Lab 10/19/23 0900  LIPASE 14   CBC: Recent Labs  Lab 10/19/23 0900 10/19/23 1449  WBC 8.6 7.9  NEUTROABS 7.5  --   HGB 11.1* 10.4*  HCT 34.2* 32.7*  MCV 97.7 98.8  PLT 208 199   Studies/Results: MR FOOT LEFT WO CONTRAST Result Date: 10/21/2023 CLINICAL DATA:  eval for 1st-3rd ray osteomyelitis Left 2nd toe amputation 09/21/2023. EXAM: MRI OF THE LEFT FOOT WITHOUT CONTRAST TECHNIQUE: Multiplanar, multisequence MR imaging of the left forefoot was performed. No intravenous contrast was administered. COMPARISON:  Multiple foot radiographs, most recently performed 09/21/2023, 10/17/2023 and 10/19/2023. No previous MRI available. FINDINGS: Technical note: Despite efforts by the technologist and patient, mild motion artifact is present on today's exam and could not be eliminated. This reduces exam sensitivity and  specificity. Motion greatest on the sagittal images. Bones/Joint/Cartilage Status post amputation of the 2nd toe through the base of the proximal phalanx. Nonspecific marrow T2 hyperintensity within the remaining 2nd proximal phalangeal base and 2nd metatarsal head with a small to moderate effusion of the 2nd metatarsophalangeal joint. No evidence of osteomyelitis within the 1st, 3rd, 4th or 5th rays. There are mild degenerative changes at the 1st metatarsophalangeal joint. There are additional greater degenerative changes in the midfoot with scattered subchondral cyst formation in the metatarsal bases and cuneiforms. The alignment is normal at the Lisfranc joint. Ligaments Intact Lisfranc ligament. The collateral ligaments of the metatarsophalangeal joints appear intact. Muscles and Tendons Mild nonspecific forefoot muscular edema. Laxity and retraction of the flexor tendons to the 2nd ray attributed to previous amputation. No significant tenosynovitis. Soft tissues Postsurgical changes related to recent 2nd toe amputation with possible incomplete wound healing dorsally. No organized fluid collection identified. There is mild dorsal subcutaneous edema. Prominent vascular calcifications on accompanying recent radiographs. IMPRESSION: 1. Postsurgical changes related to recent 2nd toe amputation with possible incomplete wound healing dorsally. No organized fluid collection identified. 2. Nonspecific marrow T2 hyperintensity within the remaining 2nd proximal phalangeal base and 2nd metatarsal head with a small to moderate effusion of the 2nd metatarsophalangeal joint. Findings are nonspecific and could  be postsurgical or secondary to early osteomyelitis. 3. No evidence of osteomyelitis within the 1st, 3rd, 4th or 5th rays. 4. Mild nonspecific forefoot muscular edema. 5. Mild degenerative changes in the midfoot and 1st metatarsophalangeal joint. Electronically Signed   By: Elsie Perone M.D.   On: 10/21/2023 10:42    Medications:  ceFEPime  (MAXIPIME ) IV 1 g (10/21/23 1722)   vancomycin  1,000 mg (10/20/23 1834)    (feeding supplement) PROSource Plus  30 mL Oral BID BM   Chlorhexidine  Gluconate Cloth  6 each Topical Q0600   cinacalcet   60 mg Oral Q T,Th,Sat-1800   enoxaparin  (LOVENOX ) injection  30 mg Subcutaneous Q24H   gabapentin   100 mg Oral TID   insulin  aspart  0-9 Units Subcutaneous TID WC   insulin  glargine  50 Units Subcutaneous Daily   metroNIDAZOLE   500 mg Oral Q12H   multivitamin  1 tablet Oral QHS   mupirocin  cream   Topical QODAY   pantoprazole   40 mg Oral Daily   rosuvastatin   10 mg Oral Daily   sevelamer  carbonate  1,600 mg Oral TID WC   Dialysis Orders NW MWF BFR 400 EDW 96.8 kg 2K/2.5Ca AVF  - Heparin  2600 - Calcitriol 1.75 TIW   Assessment/Plan: Sepsis/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 On IV Vanc/Cefepime . Blood cultures NGTD. Podiatry consulted, s/p MRI with plan for prox 2nd toe amp v. TMA tomorrow. ESRD.  Continue usual MWF HD - for HD tomorrow as well, ideally PRIOR to surgery. Will double check OR schedule in AM - appears to be 11:45am - and try to bring him prior. Hypertension/volume. BP controlled, min LE edema. Keeping EDW same for now. Anemia. Hgb 10.4, monitor without ESA for now. Metabolic bone disease.  Ca ok, Phos pending. continue home binders/cinacalcet . Nutrition: Alb low, continue supplements. T2DM: Insulin  per primary. DVT prophylaxis: Prefer heparin  over Lovenox  in ESRD patient.   Izetta Boehringer, PA-C 10/22/2023, 9:54 AM  [employer]

## 2023-10-22 NOTE — Plan of Care (Signed)

## 2023-10-22 NOTE — Progress Notes (Signed)
 PODIATRY PROGRESS NOTE Patient Name: Edgar Brewer  DOB 08/15/1960 DOA 10/19/2023  Hospital Day: 4  Assessment:  63 y.o. 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   AF, VSS  Imaging: MRI left foot 9/27 IMPRESSION: 1. Postsurgical changes related to recent 2nd toe amputation with possible incomplete wound healing dorsally. No organized fluid collection identified. 2. Nonspecific marrow T2 hyperintensity within the remaining 2nd proximal phalangeal base and 2nd metatarsal head with a small to moderate effusion of the 2nd metatarsophalangeal joint. Findings are nonspecific and could be postsurgical or secondary to early osteomyelitis. 3. No evidence of osteomyelitis within the 1st, 3rd, 4th or 5th rays. 4. Mild nonspecific forefoot muscular edema. 5. Mild degenerative changes in the midfoot and 1st metatarsophalangeal joint.     Electronically Signed   By: Elsie Perone M.D.   On: 10/21/2023 10:42 Plan:  - Case discussed with Dr. Malvin - Findings discussed with patient - Angiogram left lower extremity showing limited arterial flow to the left forefoot concerning for wound healing potential - Plan for OR on Monday 9/29 left TMA vs Debridement of nonhealing amputation site with excision of 2nd proximal phalanx. - Does have dialysis Monday morning, will need noon or later start time, per OR availability around 3pm - Risks and Benefits discussed with patient -Obtain consent -N.p.o. starting at midnight tonight -Please hold Lovenox , may be resumed postoperatively -Dressing left intact today        Ethan LITTIE Saddler, DPM [facility]    Subjective:  Patient seen  resting comfortably at bedside.  Intermittent shooting pain left foot at left second toe amputation site.  Otherwise feeling well.  Objective:   Vitals:   10/22/23 0420 10/22/23 0752  BP: (!) 119/56 (!) 112/57  Pulse: 84 85  Resp: 18   Temp: 98.9 F (37.2 C) 98.2 F (36.8 C)  SpO2: 93% 91%       Latest Ref Rng & Units 10/19/2023    2:49 PM 10/19/2023    9:00 AM 09/22/2023    5:01 AM  CBC  WBC 4.0 - 10.5 K/uL 7.9  8.6  7.2   Hemoglobin 13.0 - 17.0 g/dL 89.5  88.8  87.2   Hematocrit 39.0 - 52.0 % 32.7  34.2  38.4   Platelets 150 - 400 K/uL 199  208  243        Latest Ref Rng & Units 10/19/2023    2:49 PM 10/19/2023    9:00 AM 09/22/2023    7:05 AM  BMP  Glucose 70 - 99 mg/dL  897  818   BUN 8 - 23 mg/dL  27  62   Creatinine 9.38 - 1.24 mg/dL 0.63  1.26  86.19   Sodium 135 - 145 mmol/L  136  135   Potassium 3.5 - 5.1 mmol/L  5.2  5.9   Chloride 98 - 111 mmol/L  92  95   CO2 22 - 32 mmol/L  27  20   Calcium  8.9 - 10.3 mg/dL  9.8  9.6     General: AAOx3, NAD  Lower Extremity Exam  Left foot dressings left intact  No pain with calf squeeze  No ascending  erythema or edema proximal to dressings  On prior exam dusky changes to the hallux with some similar changes to the forefoot, fibronecrotic nonhealing wound at the second toe amputation site   Radiology:  Results reviewed. See assessment for pertinent imaging results

## 2023-10-23 ENCOUNTER — Encounter (HOSPITAL_COMMUNITY)
Admission: EM | Disposition: A | Discharge status: Skilled Nursing Facility | Payer: Self-pay | Source: Home / Self Care | Attending: Internal Medicine

## 2023-10-23 ENCOUNTER — Inpatient Hospital Stay (HOSPITAL_COMMUNITY): Payer: Medicare (Managed Care) | Admitting: Anesthesiology

## 2023-10-23 ENCOUNTER — Inpatient Hospital Stay (HOSPITAL_COMMUNITY): Payer: Medicare (Managed Care)

## 2023-10-23 ENCOUNTER — Encounter (HOSPITAL_COMMUNITY): Payer: Self-pay | Admitting: Hospitalist

## 2023-10-23 ENCOUNTER — Other Ambulatory Visit: Payer: Self-pay

## 2023-10-23 DIAGNOSIS — M86172 Other acute osteomyelitis, left ankle and foot: Secondary | ICD-10-CM | POA: Diagnosis not present

## 2023-10-23 DIAGNOSIS — M869 Osteomyelitis, unspecified: Secondary | ICD-10-CM

## 2023-10-23 DIAGNOSIS — E118 Type 2 diabetes mellitus with unspecified complications: Secondary | ICD-10-CM | POA: Diagnosis not present

## 2023-10-23 DIAGNOSIS — I96 Gangrene, not elsewhere classified: Secondary | ICD-10-CM | POA: Diagnosis not present

## 2023-10-23 DIAGNOSIS — I110 Hypertensive heart disease with heart failure: Secondary | ICD-10-CM

## 2023-10-23 DIAGNOSIS — E1169 Type 2 diabetes mellitus with other specified complication: Secondary | ICD-10-CM | POA: Diagnosis not present

## 2023-10-23 DIAGNOSIS — I5032 Chronic diastolic (congestive) heart failure: Secondary | ICD-10-CM

## 2023-10-23 DIAGNOSIS — A419 Sepsis, unspecified organism: Secondary | ICD-10-CM | POA: Diagnosis not present

## 2023-10-23 DIAGNOSIS — N186 End stage renal disease: Secondary | ICD-10-CM | POA: Diagnosis not present

## 2023-10-23 HISTORY — PX: TRANSMETATARSAL AMPUTATION: SHX6197

## 2023-10-23 LAB — CBC
HCT: 28 % — ABNORMAL LOW (ref 39.0–52.0)
Hemoglobin: 9 g/dL — ABNORMAL LOW (ref 13.0–17.0)
MCH: 30.7 pg (ref 26.0–34.0)
MCHC: 32.1 g/dL (ref 30.0–36.0)
MCV: 95.6 fL (ref 80.0–100.0)
Platelets: 231 K/uL (ref 150–400)
RBC: 2.93 MIL/uL — ABNORMAL LOW (ref 4.22–5.81)
RDW: 13.6 % (ref 11.5–15.5)
WBC: 9.1 K/uL (ref 4.0–10.5)
nRBC: 0 % (ref 0.0–0.2)

## 2023-10-23 LAB — POCT I-STAT, CHEM 8
BUN: 42 mg/dL — ABNORMAL HIGH (ref 8–23)
Calcium, Ion: 1.15 mmol/L (ref 1.15–1.40)
Chloride: 95 mmol/L — ABNORMAL LOW (ref 98–111)
Creatinine, Ser: 8.6 mg/dL — ABNORMAL HIGH (ref 0.61–1.24)
Glucose, Bld: 86 mg/dL (ref 70–99)
HCT: 30 % — ABNORMAL LOW (ref 39.0–52.0)
Hemoglobin: 10.2 g/dL — ABNORMAL LOW (ref 13.0–17.0)
Potassium: 4.1 mmol/L (ref 3.5–5.1)
Sodium: 133 mmol/L — ABNORMAL LOW (ref 135–145)
TCO2: 24 mmol/L (ref 22–32)

## 2023-10-23 LAB — BASIC METABOLIC PANEL WITH GFR
Anion gap: 20 — ABNORMAL HIGH (ref 5–15)
BUN: 104 mg/dL — ABNORMAL HIGH (ref 8–23)
CO2: 21 mmol/L — ABNORMAL LOW (ref 22–32)
Calcium: 9.5 mg/dL (ref 8.9–10.3)
Chloride: 92 mmol/L — ABNORMAL LOW (ref 98–111)
Creatinine, Ser: 15.16 mg/dL — ABNORMAL HIGH (ref 0.61–1.24)
GFR, Estimated: 3 mL/min — ABNORMAL LOW (ref >60)
Glucose, Bld: 84 mg/dL (ref 70–99)
Potassium: 5.2 mmol/L — ABNORMAL HIGH (ref 3.5–5.1)
Sodium: 133 mmol/L — ABNORMAL LOW (ref 135–145)

## 2023-10-23 LAB — GLUCOSE, CAPILLARY
Glucose-Capillary: 89 mg/dL (ref 70–99)
Glucose-Capillary: 80 mg/dL (ref 70–99)
Glucose-Capillary: 85 mg/dL (ref 70–99)
Glucose-Capillary: 88 mg/dL (ref 70–99)
Glucose-Capillary: 124 mg/dL — ABNORMAL HIGH (ref 70–99)

## 2023-10-23 SURGERY — AMPUTATION, FOOT, TRANSMETATARSAL
Anesthesia: General | Site: Toe | Laterality: Left

## 2023-10-23 SURGERY — AMPUTATION, TOE
Anesthesia: General | Site: Toe | Laterality: Left

## 2023-10-23 MED ORDER — FENTANYL CITRATE (PF) 100 MCG/2ML IJ SOLN: 25.0000 ug | INTRAMUSCULAR | Status: DC | PRN

## 2023-10-23 MED ORDER — ANTICOAGULANT SODIUM CITRATE 4% (200MG/5ML) IV SOLN: 5.0000 mL | Status: DC | PRN

## 2023-10-23 MED ORDER — OXYCODONE HCL 5 MG PO TABS: 5.0000 mg | ORAL_TABLET | Freq: Once | ORAL | Status: DC | PRN

## 2023-10-23 MED ORDER — SODIUM CHLORIDE 0.9 % IV SOLN: INTRAVENOUS | Status: DC

## 2023-10-23 MED ORDER — PENTAFLUOROPROP-TETRAFLUOROETH EX AERO: 1.0000 | INHALATION_SPRAY | CUTANEOUS | Status: DC | PRN

## 2023-10-23 MED ORDER — LIDOCAINE HCL (PF) 1 % IJ SOLN: 5.0000 mL | INTRAMUSCULAR | Status: DC | PRN

## 2023-10-23 MED ORDER — PROPOFOL 10 MG/ML IV BOLUS
INTRAVENOUS | Status: DC | PRN
  Administered 2023-10-23: 100 mg via INTRAVENOUS

## 2023-10-23 MED ORDER — CHLORHEXIDINE GLUCONATE 0.12 % MT SOLN
OROMUCOSAL | Status: AC
  Administered 2023-10-23: 15 mL via OROMUCOSAL
  Filled 2023-10-23: qty 15

## 2023-10-23 MED ORDER — EPHEDRINE SULFATE-NACL 50-0.9 MG/10ML-% IV SOSY
PREFILLED_SYRINGE | INTRAVENOUS | Status: DC | PRN
  Administered 2023-10-23 (×2): 5 mg via INTRAVENOUS

## 2023-10-23 MED ORDER — BUPIVACAINE HCL (PF) 0.5 % IJ SOLN
INTRAMUSCULAR | Status: AC
  Filled 2023-10-23: qty 30

## 2023-10-23 MED ORDER — MIDAZOLAM HCL 2 MG/2ML IJ SOLN
INTRAMUSCULAR | Status: AC
  Filled 2023-10-23: qty 2

## 2023-10-23 MED ORDER — LIDOCAINE HCL 1 % IJ SOLN
INTRAMUSCULAR | Status: AC
  Filled 2023-10-23: qty 20

## 2023-10-23 MED ORDER — ORAL CARE MOUTH RINSE: 15.0000 mL | Freq: Once | OROMUCOSAL | Status: AC

## 2023-10-23 MED ORDER — CHLORHEXIDINE GLUCONATE 0.12 % MT SOLN: 15.0000 mL | Freq: Once | OROMUCOSAL | Status: AC

## 2023-10-23 MED ORDER — LIDOCAINE HCL (PF) 1 % IJ SOLN
INTRAMUSCULAR | Status: DC | PRN
  Administered 2023-10-23 (×2): 10 mL via INTRAMUSCULAR

## 2023-10-23 MED ORDER — VANCOMYCIN HCL IN DEXTROSE 1-5 GM/200ML-% IV SOLN
1000.0000 mg | INTRAVENOUS | Status: DC
  Administered 2023-10-25: 1000 mg via INTRAVENOUS

## 2023-10-23 MED ORDER — FENTANYL CITRATE (PF) 250 MCG/5ML IJ SOLN
INTRAMUSCULAR | Status: DC | PRN
  Administered 2023-10-23: 50 ug via INTRAVENOUS

## 2023-10-23 MED ORDER — PHENYLEPHRINE 80 MCG/ML (10ML) SYRINGE FOR IV PUSH (FOR BLOOD PRESSURE SUPPORT)
PREFILLED_SYRINGE | INTRAVENOUS | Status: DC | PRN
  Administered 2023-10-23 (×4): 80 ug via INTRAVENOUS

## 2023-10-23 MED ORDER — ONDANSETRON HCL 4 MG/2ML IJ SOLN: 4.0000 mg | Freq: Four times a day (QID) | INTRAMUSCULAR | Status: DC | PRN

## 2023-10-23 MED ORDER — SODIUM CHLORIDE 0.9 % IR SOLN
Status: DC | PRN
  Administered 2023-10-23: 1000 mL

## 2023-10-23 MED ORDER — ALTEPLASE 2 MG IJ SOLR: 2.0000 mg | Freq: Once | INTRAMUSCULAR | Status: DC | PRN

## 2023-10-23 MED ORDER — FENTANYL CITRATE (PF) 250 MCG/5ML IJ SOLN
INTRAMUSCULAR | Status: AC
  Filled 2023-10-23: qty 5

## 2023-10-23 MED ORDER — OXYCODONE HCL 5 MG/5ML PO SOLN: 5.0000 mg | Freq: Once | ORAL | Status: DC | PRN

## 2023-10-23 MED ORDER — DEXAMETHASONE SODIUM PHOSPHATE 10 MG/ML IJ SOLN
INTRAMUSCULAR | Status: DC | PRN
  Administered 2023-10-23: 5 mg via INTRAVENOUS

## 2023-10-23 MED ORDER — INSULIN ASPART 100 UNIT/ML IJ SOLN: 0.0000 [IU] | INTRAMUSCULAR | Status: DC | PRN

## 2023-10-23 MED ORDER — HEPARIN SODIUM (PORCINE) 1000 UNIT/ML DIALYSIS: 1000.0000 [IU] | INTRAMUSCULAR | Status: DC | PRN

## 2023-10-23 MED ORDER — ONDANSETRON HCL 4 MG/2ML IJ SOLN
INTRAMUSCULAR | Status: DC | PRN
  Administered 2023-10-23: 4 mg via INTRAVENOUS

## 2023-10-23 MED ORDER — DARBEPOETIN ALFA 60 MCG/0.3ML IJ SOSY
60.0000 ug | PREFILLED_SYRINGE | INTRAMUSCULAR | Status: DC
  Filled 2023-10-23 (×2): qty 0.3

## 2023-10-23 MED ORDER — LIDOCAINE-PRILOCAINE 2.5-2.5 % EX CREA: 1.0000 | TOPICAL_CREAM | CUTANEOUS | Status: DC | PRN

## 2023-10-23 SURGICAL SUPPLY — 38 items
BLADE AVERAGE 25X9 (BLADE) IMPLANT
BLADE SURG 10 STRL SS (BLADE) ×1 IMPLANT
BLADE SURG 15 STRL LF DISP TIS (BLADE) ×1 IMPLANT
BNDG COHESIVE 3X5 TAN ST LF (GAUZE/BANDAGES/DRESSINGS) ×1 IMPLANT
BNDG COMPR ESMARK 4X3 LF (GAUZE/BANDAGES/DRESSINGS) ×1 IMPLANT
BNDG ELASTIC 3INX 5YD STR LF (GAUZE/BANDAGES/DRESSINGS) ×1 IMPLANT
BNDG ELASTIC 4INX 5YD STR LF (GAUZE/BANDAGES/DRESSINGS) IMPLANT
BNDG GAUZE DERMACEA FLUFF 4 (GAUZE/BANDAGES/DRESSINGS) IMPLANT
CHLORAPREP W/TINT 26 (MISCELLANEOUS) IMPLANT
DRSG ADAPTIC 3X8 NADH LF (GAUZE/BANDAGES/DRESSINGS) IMPLANT
DRSG XEROFORM 1X8 (GAUZE/BANDAGES/DRESSINGS) IMPLANT
ELECTRODE REM PT RTRN 9FT ADLT (ELECTROSURGICAL) ×1 IMPLANT
GAUZE PAD ABD 8X10 STRL (GAUZE/BANDAGES/DRESSINGS) IMPLANT
GAUZE SPONGE 2X2 STRL 8-PLY (GAUZE/BANDAGES/DRESSINGS) IMPLANT
GAUZE SPONGE 4X4 12PLY STRL (GAUZE/BANDAGES/DRESSINGS) ×1 IMPLANT
GAUZE STRETCH 2X75IN STRL (MISCELLANEOUS) ×1 IMPLANT
GAUZE XEROFORM 1X8 LF (GAUZE/BANDAGES/DRESSINGS) ×1 IMPLANT
GAUZE XEROFORM 5X9 LF (GAUZE/BANDAGES/DRESSINGS) IMPLANT
GLOVE BIO SURGEON STRL SZ7.5 (GLOVE) ×1 IMPLANT
GLOVE BIOGEL PI IND STRL 7.5 (GLOVE) ×1 IMPLANT
GOWN STRL REUS W/ TWL LRG LVL3 (GOWN DISPOSABLE) ×2 IMPLANT
KIT BASIN OR (CUSTOM PROCEDURE TRAY) ×1 IMPLANT
NDL HYPO 25X1 1.5 SAFETY (NEEDLE) ×1 IMPLANT
NEEDLE HYPO 25X1 1.5 SAFETY (NEEDLE) ×1 IMPLANT
PACK ORTHO EXTREMITY (CUSTOM PROCEDURE TRAY) ×1 IMPLANT
PADDING CAST ABS COTTON 4X4 ST (CAST SUPPLIES) ×2 IMPLANT
SET HNDPC FAN SPRY TIP SCT (DISPOSABLE) IMPLANT
SOLN STERILE WATER 1000 ML (IV SOLUTION) ×1 IMPLANT
SOLN STERILE WATER BTL 1000 ML (IV SOLUTION) ×1 IMPLANT
SPIKE FLUID TRANSFER (MISCELLANEOUS) IMPLANT
STOCKINETTE 4X48 STRL (DRAPES) IMPLANT
SUT ETHILON 3 0 FSLX (SUTURE) IMPLANT
SUT PROLENE 3 0 PS 2 (SUTURE) IMPLANT
SUT PROLENE 4 0 PS 2 18 (SUTURE) IMPLANT
SYR CONTROL 10ML LL (SYRINGE) ×1 IMPLANT
TUBE CONNECTING 12X1/4 (SUCTIONS) IMPLANT
UNDERPAD 30X36 HEAVY ABSORB (UNDERPADS AND DIAPERS) ×1 IMPLANT
YANKAUER SUCT BULB TIP NO VENT (SUCTIONS) IMPLANT

## 2023-10-23 NOTE — Progress Notes (Signed)
 [facility] Progress Note   Subjective:   Seen on HD - 2.5L UFG. Per OR schedule, surgery will be around 3pm now, so extending HD time to 3:30h and using larger dialyzer today. Denies CP/dyspnea today.  Objective Vitals:   10/22/23 1614 10/23/23 0734 10/23/23 0905 10/23/23 0920  BP: 138/69 (!) 117/52 (!) 123/55 (!) 122/59  Pulse: 85 81 82 80  Resp:  18 (!) 21 (!) 21  Temp: 98.4 F (36.9 C) 99.2 F (37.3 C) 98.8 F (37.1 C)   TempSrc:   Oral   SpO2: 96% 90% 92% 95%  Weight:   97.7 kg   Height:       Physical Exam General: Well appearing man, NAD. [place] O2 in place Heart: RRR Lungs: CTAB Abdomen: soft Extremities: no RLE edema, LLE with erythema, foot bandaged, great toe poking out - purplish color Dialysis Access: LUE AVF +t/b    Additional Objective Labs: Basic Metabolic Panel: Recent Labs  Lab 10/19/23 0900 10/19/23 1449 10/23/23 0915  NA 136  --  133*  K 5.2*  --  5.2*  CL 92*  --  92*  CO2 27  --  21*  GLUCOSE 102*  --  84  BUN 27*  --  104*  CREATININE 8.73* 9.36* 15.16*  CALCIUM  9.8  --  9.5   Liver Function Tests: Recent Labs  Lab 10/19/23 0900  AST 19  ALT 19  ALKPHOS 255*  BILITOT 1.1  PROT 7.1  ALBUMIN 3.0*   Recent Labs  Lab 10/19/23 0900  LIPASE 14   CBC: Recent Labs  Lab 10/19/23 0900 10/19/23 1449 10/23/23 0915  WBC 8.6 7.9 9.1  NEUTROABS 7.5  --   --   HGB 11.1* 10.4* 9.0*  HCT 34.2* 32.7* 28.0*  MCV 97.7 98.8 95.6  PLT 208 199 231   Blood Culture    Component Value Date/Time   SDES BLOOD RIGHT ARM 10/19/2023 0905   SPECREQUEST  10/19/2023 0905    BOTTLES DRAWN AEROBIC AND ANAEROBIC Blood Culture adequate volume   CULT  10/19/2023 0905    NO GROWTH 4 DAYS Performed at [facility], [address], [place], [place] [address]    REPTSTATUS PENDING 10/19/2023 9094   Medications:  anticoagulant sodium citrate      ceFEPime  (MAXIPIME ) IV Stopped (10/22/23 1736)   vancomycin  1,000 mg (10/20/23 1834)     (feeding supplement) PROSource Plus  30 mL Oral BID BM   Chlorhexidine  Gluconate Cloth  6 each Topical Q0600   cinacalcet   60 mg Oral Q T,Th,Sat-1800   gabapentin   100 mg Oral TID   insulin  aspart  0-9 Units Subcutaneous TID WC   insulin  glargine  50 Units Subcutaneous Daily   metroNIDAZOLE   500 mg Oral Q12H   multivitamin  1 tablet Oral QHS   mupirocin  cream   Topical QODAY   pantoprazole   40 mg Oral Daily   rosuvastatin   10 mg Oral Daily   sevelamer  carbonate  1,600 mg Oral TID WC    Dialysis Orders NW MWF BFR 400 EDW 96.8 kg 2K/2.5Ca AVF  - Heparin  2600 - Calcitriol 1.75 TIW   Assessment/Plan: Sepsis/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 On IV Vanc/Cefepime . Blood cultures NGTD. Podiatry consulted, s/p MRI with plan for prox 2nd toe amp v. TMA today. ESRD.  Continue usual MWF HD - HD now, 3:30hr, 2K bath, 2.5L UFG. Hypertension/volume. BP controlled, min LE edema. Keeping EDW same for now. Anemia. Hgb trending down -  will order ESA to be given today. Metabolic bone disease.  CorrCa high sided, VDRA on hold. Phos pending. Continue home binders/cinacalcet . Nutrition: Alb low, continue supplements. T2DM: Insulin  per primary. DVT prophylaxis: Prefer heparin  over Lovenox  in ESRD patient, AC in hold today for surgery.   Izetta Boehringer, PA-C 10/23/2023, 9:58 AM  [employer]

## 2023-10-23 NOTE — Anesthesia Procedure Notes (Signed)
 Procedure Name: LMA Insertion Date/Time: 10/23/2023 4:41 PM  Performed by: Mollie Olivia SAUNDERS, CRNAPre-anesthesia Checklist: Patient identified, Emergency Drugs available, Suction available and Patient being monitored Patient Re-evaluated:Patient Re-evaluated prior to induction Oxygen Delivery Method: Circle System Utilized Preoxygenation: Pre-oxygenation with 100% oxygen Induction Type: IV induction Ventilation: Mask ventilation without difficulty LMA: LMA inserted LMA Size: 5.0 Number of attempts: 1 Placement Confirmation: positive ETCO2 Tube secured with: Tape Dental Injury: Teeth and Oropharynx as per pre-operative assessment

## 2023-10-23 NOTE — Progress Notes (Signed)
  Received patient in bed to unit.   Informed consent signed and in chart.    TX duration:3.5     Transported by  Hand-off given to patient's nurse.    Access used: left AVF Access issues: None   Total UF removed: 2500 Medication(s) given: none Post HD VS: 118/62     Hunter Hacking LPN [facility]

## 2023-10-23 NOTE — Care Management Important Message (Signed)
 Important Message  Patient Details  Name: Edgar Brewer MRN: [id number] Date of Birth: [birth year]   Important Message Given:  Yes - Medicare IM     Claretta Deed 10/23/2023, 3:40 PM

## 2023-10-23 NOTE — Transfer of Care (Signed)
 Immediate Anesthesia Transfer of Care Note  Patient: Edgar Brewer  Procedure(s) Performed: AMPUTATION, FOOT, TRANSMETATARSAL (Left: Toe)  Patient Location: PACU  Anesthesia Type:General  Level of Consciousness: awake, drowsy, and responds to stimulation  Airway & Oxygen Therapy: Patient Spontanous Breathing and Patient connected to face mask oxygen  Post-op Assessment: Report given to RN, Post -op Vital signs reviewed and stable, and Patient moving all extremities X 4  Post vital signs: Reviewed and stable  Last Vitals:  Vitals Value Taken Time  BP 111/56 10/23/23 17:22  Temp    Pulse 90 10/23/23 17:25  Resp 26 10/23/23 17:25  SpO2 99 % 10/23/23 17:25  Vitals shown include unfiled device data.  Last Pain:  Vitals:   10/23/23 1531  TempSrc: Oral  PainSc: 0-No pain         Complications: No notable events documented.

## 2023-10-23 NOTE — Progress Notes (Signed)
 History and Physical Interval Note:  10/23/2023 3:35 PM  Edgar Brewer  has presented today for surgery, with the diagnosis of osteomyelitis and non healing amputation site 2nd toe left foot.  The various methods of treatment have been discussed with the patient and family. After consideration of risks, benefits and other options for treatment, the patient has consented to   Procedure(s): AMPUTATION, FOOT, TRANSMETATARSAL vs Wound debridement removal of 2nd toe proximal phalanx (Left) as a surgical intervention.  The patient's history has been reviewed, patient examined, no change in status, stable for surgery.  I have reviewed the patient's chart and labs.  Questions were answered to the patient's satisfaction.     Edgar Brewer Edgar Brewer

## 2023-10-23 NOTE — Anesthesia Postprocedure Evaluation (Signed)
 Anesthesia Post Note  Patient: Dejuan Elman  Procedure(s) Performed: AMPUTATION, FOOT, TRANSMETATARSAL (Left: Toe)     Patient location during evaluation: PACU Anesthesia Type: General Level of consciousness: awake and alert, oriented and patient cooperative Pain management: pain level controlled Vital Signs Assessment: post-procedure vital signs reviewed and stable Respiratory status: spontaneous breathing, nonlabored ventilation and respiratory function stable Cardiovascular status: blood pressure returned to baseline and stable Postop Assessment: no apparent nausea or vomiting Anesthetic complications: no   No notable events documented.  Last Vitals:  Vitals:   10/23/23 1730 10/23/23 1745  BP: (!) 112/55 (!) 117/57  Pulse: 90 87  Resp: 19 19  Temp:    SpO2: 93% 96%    Last Pain:  Vitals:   10/23/23 1745  TempSrc:   PainSc: 0-No pain                 Kenyonna Micek,E. Avianna Moynahan

## 2023-10-23 NOTE — Anesthesia Preprocedure Evaluation (Signed)
 Anesthesia Evaluation  Patient identified by MRN, date of birth, ID band Patient awake    Reviewed: Allergy & Precautions, H&P , NPO status , Patient's Chart, lab work & pertinent test results  Airway Mallampati: II   Neck ROM: full    Dental   Pulmonary former smoker   breath sounds clear to auscultation       Cardiovascular hypertension, + Peripheral Vascular Disease and +CHF   Rhythm:regular Rate:Normal  TTE (08/2023): EF 60%, mild AS.   Neuro/Psych CVA    GI/Hepatic ,GERD  ,,  Endo/Other  diabetes, Type 2    Renal/GU ESRF and DialysisRenal disease     Musculoskeletal  (+) Arthritis ,    Abdominal   Peds  Hematology   Anesthesia Other Findings   Reproductive/Obstetrics                              Anesthesia Physical Anesthesia Plan  ASA: 3  Anesthesia Plan: General   Post-op Pain Management:    Induction: Intravenous  PONV Risk Score and Plan: 2 and Ondansetron , Dexamethasone , Treatment may vary due to age or medical condition and Midazolam   Airway Management Planned: LMA  Additional Equipment:   Intra-op Plan:   Post-operative Plan: Extubation in OR  Informed Consent: I have reviewed the patients History and Physical, chart, labs and discussed the procedure including the risks, benefits and alternatives for the proposed anesthesia with the patient or authorized representative who has indicated his/her understanding and acceptance.     Dental advisory given  Plan Discussed with: CRNA, Anesthesiologist and Surgeon  Anesthesia Plan Comments:         Anesthesia Quick Evaluation

## 2023-10-23 NOTE — Op Note (Signed)
 Full Operative Report  Date of Operation: 4:16 PM, 10/23/2023   Patient: Edgar Brewer - 63 y.o. male  Surgeon: Malvin Marsa FALCON, DPM   Assistant: None  Diagnosis: osteomyelitis left second toe, gangrene foot  Procedure:  1.  Transmetatarsal amputation, left foot    Anesthesia: Anesthesia type not filed in the log.  No responsible provider has been recorded for the case.  No anesthesia staff entered.   Estimated Blood Loss: 30 mL  Hemostasis: 1) Anatomical dissection, mechanical compression, electrocautery 2) no tourniquet was used during procedure  Implants: * No implants in log *  Materials: Prolene 2-0 and skin staples  Injectables: 1) Pre-operatively: 20 cc of 50:50 mixture 1%lidocaine  plain and 0.5% marcaine  plain 2) Post-operatively: None   Specimens: - Pathology: Left forefoot for pathology - Microbiology: Deep tissue culture second MPJ   Antibiotics: IV antibiotics given per schedule on the floor  Drains: None  Complications: Patient tolerated the procedure well without complication.   Operative findings: As below in detailed report  Indications for Procedure: [name] presents to Malvin Marsa FALCON, DPM with a chief complaint of nonhealing ulceration at the second toe amputation site on the left foot with concern for residual osteomyelitis of the proximal phalanx base.  Also with gangrenous changes of the 1st and 3rd digit.  The patient has failed conservative treatments of various modalities. At this time the patient has elected to proceed with surgical correction. All alternatives, risks, and complications of the procedures were thoroughly explained to the patient. Patient exhibits appropriate understanding of all discussion points and informed consent was signed and obtained in the chart with no guarantees to surgical outcome given or implied.  Description of Procedure: Patient was brought to the operating room. Patient remained on their  hospital bed in the supine position. A surgical timeout was performed and all members of the operating room, the procedure, and the surgical site were identified. anesthesia occurred as per anesthesia record. Local anesthetic as previously described was then injected about the operative field in a local infiltrative block.  The operative lower extremity as noted above was then prepped and draped in the usual sterile manner. The following procedure then began.  Attention was directed to the LEFT lower extremity. A fish-mouth type incision was made proximal to the web spaces and encompassed the entire forefoot. The full-thickness incision was made with a longer plantar flap to allow for wound closure. The incision was continued through the soft tissue down to the shafts of the metatarsal bones. A 15 blade was then used to free up the periosteum on all the metatarsal shafts. Using an oscillating saw, the metatarsals were cut in a dorsal distal to plantar proximal orientation. The first metatarsal was beveled so that the medial cortex was shorter than the lateral, and the fifth metatarsal was beveled so that the lateral cortex was shorter than the medial, thus less prominent. The amputation was done so that a metatarsal parabola was maintained.  The distal portion including all the digits were freed from the metatarsals and soft tissue attachments. The specimen was passed off the field and sent for gross pathology. A deep tissue culture was taken from the 2nd toe amputation site. All remaining non-viable and necrotic tissues were sharply resected and removed. Extensor and flexors tendons were grasped with a hemostat and cut proximally. Good bleeding was seen from the amputation flap sites.   The surgical site was then flushed with 1000ml of saline. The plantar flap was brought in  approximation with the dorsal flap and the sutures material previously described was used for closure. Care was taken not to place the flaps  under tension in order not to jeopardize the vascular supply.  The surgical site was then dressed with Xeroform 4 x 4 gauze ABD pad Kerlix Ace wrap. The patient tolerated both the procedure and anesthesia well with vital signs stable throughout. The patient was transferred in good condition and all vital signs stable  from the OR to recovery under the discretion of anesthesia.  Condition: Vital signs stable, neurovascular status unchanged from preoperative   Surgical plan:  Evidence of early gangrene of the digital level pre proc, though good bleeding was seen at the TMA level, hopeful for healing at this level. Strict NWB to L foot. De escalate abx to PO x 7 days as culture data comes back.    The patient will be NWB in a post op shoe to the operative limb until further instructed. The dressing is to remain clean, dry, and intact. Will continue to follow unless noted elsewhere.   Marsa Honour, DPM [facility]

## 2023-10-23 NOTE — Progress Notes (Signed)
 Progress Note   Patient: Edgar Brewer FMW:[id number] DOB: Apr 27, 1960 DOA: 10/19/2023     4 DOS: the patient was seen and examined on 10/23/2023   Brief hospital course: Partly taken from H&P.  Yishai Rehfeld is a 63 y.o. 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   Patient had amputation 2 weeks ago, noticed purulent discharge and was given 1 week of antibiotics as outpatient.  Patient was having chills so brought to ED  On presentation febrile at 100.8, tachycardic and tachypneic, hypoxic requiring 2 L of oxygen.  Labs with potassium of 5.2, creatinine 8.73, lactic acid 5.6> 0.8,US  LLE W/O DVT. XR L foot w/o acute findings or OM.  Blood cultures were drawn.  Patient was started on vancomycin , cefepime  and Flagyl .  Podiatry was consulted.  9/26: Temperature of 99.8, A1c of 6.3, CBG 213, BNP of 387 but patient is on dialysis, troponin 25>24, preliminary blood culture negative. Apparently further surgery was planned for upcoming Thursday by podiatry but patient was admitted due to worsening infection.  Podiatry ordered MRI, increased risk of nonhealing wound due to PAD.  Case was discussed with vascular surgery by podiatry.  9/27: Remained afebrile with stable vitals, MRI for with postsurgical changes and incomplete wound healing dorsally, no organized fluid collection, there was nonspecific T2 hyperintensity within the remaining second proximal phalangeal base and second metatarsal head which can be either postsurgical or secondary to early osteomyelitis.  No other specific evidence of osteomyelitis noted. Awaiting podiatry decision regarding further surgery.  9/28: Hemodynamically stable, going to the OR with podiatry tomorrow for TMA versus further debridement of current amputation site.  Lovenox  is  being held for surgery.  9/29: Remained hemodynamically stable, going to the OR later today.  Assessment and Plan: * Sepsis (HCC) Sepsis 2/2 surgical wound infection S/p recent amputation of L 2nd toe through proximal phalanx. Podiatry was consulted-MRI for with postsurgical changes and incomplete wound healing dorsally, no organized fluid collection, there was nonspecific T2 hyperintensity within the remaining second proximal phalangeal base and second metatarsal head which can be either postsurgical or secondary to early osteomyelitis.  No other specific evidence of osteomyelitis noted. -Going back to the OR in the afternoon today with podiatry - Continue with broad-spectrum antibiotics -Continue with supportive care  Diabetes mellitus type 2 with complications (HCC) CBG within goal - Continue with Lantus  and SSI  ESRD on dialysis [facility]) Patient is on MWF schedule, received dialysis today. - Nephrology was consulted  Neuropathy - Continue with home gabapentin   PAD (peripheral artery disease) Patient with history of significant PAD, will be high risk in terms of wound healing or progressive amputation. Podiatry is communicating with vascular surgery. - Continue with home Crestor   Subjective: Patient was seen during dialysis.  No new concern.  Awaiting to go to the OR after finishing dialysis.  Physical Exam: Vitals:   10/23/23 0920 10/23/23 1000 10/23/23 1030 10/23/23 1100  BP: (!) 122/59 (!) 130/57 (!) 112/50 (!) 106/53  Pulse: 80 82 81 80  Resp: (!) 21 (!) 24 20 (!) 26  Temp:      TempSrc:      SpO2: 95% 98% (!) 89% 97%  Weight:      Height:       General.  Obese gentleman, in no acute distress. Pulmonary.  Lungs clear bilaterally, normal respiratory effort. CV.  Regular rate and rhythm, no JVD, rub or murmur. Abdomen.  Soft, nontender, nondistended, BS positive. CNS.  Alert and oriented .  No focal neurologic deficit. Extremities.  No edema, left foot with  bandage Psychiatry.  Judgment and insight appears normal.   Data Reviewed: Prior data reviewed  Family Communication: Discussed with patient  Disposition: Status is: Inpatient Remains inpatient appropriate because: Severity of illness  Planned Discharge Destination: To be determined after the surgery  DVT prophylaxis.  Lovenox  Time spent: 50 minutes  This record has been created using Conservation officer, historic buildings. Errors have been sought and corrected,but may not always be located. Such creation errors do not reflect on the standard of care.   Author: Amaryllis Dare, MD 10/23/2023 11:36 AM  For on call review [email or web address].

## 2023-10-24 ENCOUNTER — Ambulatory Visit: Payer: PRIVATE HEALTH INSURANCE

## 2023-10-24 ENCOUNTER — Ambulatory Visit: Payer: Medicare (Managed Care) | Admitting: Family Medicine

## 2023-10-24 ENCOUNTER — Encounter (HOSPITAL_COMMUNITY): Payer: Self-pay | Admitting: Podiatry

## 2023-10-24 DIAGNOSIS — A419 Sepsis, unspecified organism: Secondary | ICD-10-CM | POA: Diagnosis not present

## 2023-10-24 LAB — COMPREHENSIVE METABOLIC PANEL WITH GFR
ALT: 21 U/L (ref 0–44)
AST: 22 U/L (ref 15–41)
Albumin: 2.7 g/dL — ABNORMAL LOW (ref 3.5–5.0)
Alkaline Phosphatase: 350 U/L — ABNORMAL HIGH (ref 38–126)
Anion gap: 16 — ABNORMAL HIGH (ref 5–15)
BUN: 68 mg/dL — ABNORMAL HIGH (ref 8–23)
CO2: 24 mmol/L (ref 22–32)
Calcium: 9.8 mg/dL (ref 8.9–10.3)
Chloride: 92 mmol/L — ABNORMAL LOW (ref 98–111)
Creatinine, Ser: 10.43 mg/dL — ABNORMAL HIGH (ref 0.61–1.24)
GFR, Estimated: 5 mL/min — ABNORMAL LOW
Glucose, Bld: 238 mg/dL — ABNORMAL HIGH (ref 70–99)
Potassium: 5.2 mmol/L — ABNORMAL HIGH (ref 3.5–5.1)
Sodium: 132 mmol/L — ABNORMAL LOW (ref 135–145)
Total Bilirubin: 0.8 mg/dL (ref 0.0–1.2)
Total Protein: 7.1 g/dL (ref 6.5–8.1)

## 2023-10-24 LAB — CBC
HCT: 32.8 % — ABNORMAL LOW (ref 39.0–52.0)
Hemoglobin: 10.7 g/dL — ABNORMAL LOW (ref 13.0–17.0)
MCH: 30.8 pg (ref 26.0–34.0)
MCHC: 32.6 g/dL (ref 30.0–36.0)
MCV: 94.5 fL (ref 80.0–100.0)
Platelets: 276 10*3/uL (ref 150–400)
RBC: 3.47 MIL/uL — ABNORMAL LOW (ref 4.22–5.81)
RDW: 13.6 % (ref 11.5–15.5)
WBC: 12.1 10*3/uL — ABNORMAL HIGH (ref 4.0–10.5)
nRBC: 0 % (ref 0.0–0.2)

## 2023-10-24 LAB — CULTURE, BLOOD (ROUTINE X 2)
Culture: NO GROWTH
Culture: NO GROWTH
Special Requests: ADEQUATE
Special Requests: ADEQUATE

## 2023-10-24 LAB — GLUCOSE, CAPILLARY
Glucose-Capillary: 322 mg/dL — ABNORMAL HIGH (ref 70–99)
Glucose-Capillary: 229 mg/dL — ABNORMAL HIGH (ref 70–99)
Glucose-Capillary: 201 mg/dL — ABNORMAL HIGH (ref 70–99)
Glucose-Capillary: 201 mg/dL — ABNORMAL HIGH (ref 70–99)
Glucose-Capillary: 169 mg/dL — ABNORMAL HIGH (ref 70–99)

## 2023-10-24 LAB — VANCOMYCIN, RANDOM: Vancomycin Rm: 18 ug/mL

## 2023-10-24 LAB — PHOSPHORUS: Phosphorus: 7.5 mg/dL — ABNORMAL HIGH (ref 2.5–4.6)

## 2023-10-24 MED ORDER — SODIUM ZIRCONIUM CYCLOSILICATE 10 G PO PACK
10.0000 g | PACK | Freq: Once | ORAL | Status: AC
  Administered 2023-10-24: 10 g via ORAL
  Filled 2023-10-24: qty 1

## 2023-10-24 MED ORDER — HYDROCODONE-ACETAMINOPHEN 5-325 MG PO TABS
1.0000 | ORAL_TABLET | Freq: Four times a day (QID) | ORAL | Status: DC | PRN
  Administered 2023-10-24 – 2023-10-26 (×4): 1 via ORAL
  Filled 2023-10-24 (×6): qty 1

## 2023-10-24 NOTE — Evaluation (Signed)
 Physical Therapy Evaluation Patient Details Name: Edgar Brewer MRN: [id number] DOB: 24-Jan-1961 Today's Date: 10/24/2023  History of Present Illness  Edgar Brewer is a 63 y.o. 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 Lives at home with mother, in a single-level home with 3-4 steps to enter; Prior to admission, pt was able to manage independently with cane; he does state that he did not keep foot elevated as much as he thinks the doctor would have liked; Presents to PT with decr functional mobility, pain in L foot, incr difficutly maintaining NWB L foot; Abel to get up to EOB with supervision; Min assis tto stand from EOB; difficulty taking pivot steps bed to recliner with RW without putting weight on LLE; will need to consider a wheelchair for mobility as he heals; Worth considering post-acute rehab to maximize independence and safety with mobility and ADLs, and in particular, incr time for stair training as he must go out to HD 3x/week;  Pt currently with functional limitations due to the deficits listed below (see PT Problem List). Pt will benefit from skilled PT to increase their independence and safety with mobility to allow discharge to the venue listed below.           If plan is discharge home, recommend the following: A lot of help with walking and/or transfers;Help with stairs or ramp for entrance   Can travel by private vehicle   No    Equipment Recommendations Wheelchair (measurements PT);Wheelchair cushion (measurements PT);Rolling walker (2 wheels)  Recommendations for Other Services       Functional Status Assessment Patient has had a recent decline in their functional status and  demonstrates the ability to make significant improvements in function in a reasonable and predictable amount of time.     Precautions / Restrictions Precautions Precautions: Fall Recall of Precautions/Restrictions: Impaired Required Braces or Orthoses: Other Brace Other Brace: postop shoe Restrictions Weight Bearing Restrictions Per Provider Order: Yes LLE Weight Bearing Per Provider Order: Non weight bearing      Mobility  Bed Mobility Overal bed mobility: Needs Assistance Bed Mobility: Supine to Sit     Supine to sit: Supervision          Transfers Overall transfer level: Needs assistance Equipment used: Rolling walker (2 wheels) Transfers: Sit to/from Stand, Bed to chair/wheelchair/BSC Sit to Stand: Min assist, From elevated surface   Step pivot transfers: Min assist       General transfer comment: Good rise to stand, near constant cues for NWB LLE; Able to static stand with L foot held up; difficulty keeping L foot off of teh floor as pt took heel-toe pivot steps on R    Ambulation/Gait               General Gait Details: Did not attempt gait due to diffiuclty keeping weight off of L foot with transfers  Stairs Stairs: Yes       General stair comments: Lengthy discussion re: managing stairs; showed pt adn mother techqniue fo rusing shower chair and stairs  Wheelchair Mobility     Tilt Bed    Modified Rankin (Stroke Patients Only)       Balance Overall balance assessment: Needs assistance  Sitting balance-Leahy Scale: Fair       Standing balance-Leahy Scale: Poor                               Pertinent Vitals/Pain Pain Assessment Pain Assessment: Faces Faces Pain Scale: Hurts a little bit Pain Location: L foot Pain Descriptors / Indicators: Throbbing Pain Intervention(s): Premedicated before session    Home Living Family/patient expects to be discharged to:: Private residence Living Arrangements: Parent Available  Help at Discharge: Family Type of Home: House Home Access: Stairs to enter Entrance Stairs-Rails: Right Entrance Stairs-Number of Steps: 4   Home Layout: One level Home Equipment: Cane - quad;Shower seat      Prior Function Prior Level of Function : Independent/Modified Independent             Mobility Comments: Ambulatory without AD, denies falls, ADLs Comments: Independent with bathing & dressing. Mother does cooking & cleaning & provides transportation to dialysis as pt is unable to drive 2/2 vision issues.     Extremity/Trunk Assessment   Upper Extremity Assessment Upper Extremity Assessment: Generalized weakness    Lower Extremity Assessment Lower Extremity Assessment: Generalized weakness;LLE deficits/detail LLE Deficits / Details: transmet amputaion, dressing CDI; able to straight leg raise against gravity       Communication   Communication Communication: No apparent difficulties    Cognition Arousal: Alert Behavior During Therapy: WFL for tasks assessed/performed                           PT - Cognition Comments: slow to answer questions, likely due to pain meds Following commands: Intact       Cueing Cueing Techniques: Verbal cues, Other (comments) (demo cues)     General Comments General comments (skin integrity, edema, etc.): Pt's mother present and helpful for clarification of household setup    Exercises     Assessment/Plan    PT Assessment Patient needs continued PT services  PT Problem List Decreased strength;Decreased range of motion;Decreased activity tolerance;Decreased balance;Decreased mobility;Decreased coordination;Decreased cognition;Decreased knowledge of use of DME;Decreased safety awareness;Decreased knowledge of precautions;Pain;Decreased skin integrity       PT Treatment Interventions DME instruction;Gait training;Stair training;Functional mobility training;Therapeutic activities;Therapeutic exercise;Balance  training;Cognitive remediation;Patient/family education;Wheelchair mobility training;Manual techniques;Modalities    PT Goals (Current goals can be found in the Care Plan section)  Acute Rehab PT Goals Patient Stated Goal: To get better and be able to bear weight on L foot PT Goal Formulation: With patient/family Time For Goal Achievement: 11/07/23 Potential to Achieve Goals: Good    Frequency Min 2X/week     Co-evaluation               AM-PAC PT 6 Clicks Mobility  Outcome Measure Help needed turning from your back to your side while in a flat bed without using bedrails?: None Help needed moving from lying on your back to sitting on the side of a flat bed without using bedrails?: None Help needed moving to and from a bed to a chair (including a wheelchair)?: A Lot Help needed standing up from a chair using your arms (e.g., wheelchair or bedside chair)?: A Little Help needed to walk in hospital room?: Total Help needed climbing 3-5 steps with a railing? : Total 6 Click Score: 15    End of Session Equipment Utilized During Treatment: Gait belt Activity Tolerance: Patient tolerated treatment well Patient left: in chair;with  call bell/phone within reach;with chair alarm set Nurse Communication: Mobility status PT Visit Diagnosis: Unsteadiness on feet (R26.81);Other abnormalities of gait and mobility (R26.89);Muscle weakness (generalized) (M62.81);Pain Pain - Right/Left: Left Pain - part of body: Ankle and joints of foot    Time: 1245-1330 PT Time Calculation (min) (ACUTE ONLY): 45 min   Charges:   PT Evaluation $PT Eval Moderate Complexity: 1 Mod PT Treatments $Therapeutic Activity: 23-37 mins PT General Charges $$ ACUTE PT VISIT: 1 Visit         Silvano Currier, PT  Acute Rehabilitation Services Office [phone] Secure Chat welcomed'   Silvano VEAR Currier 10/24/2023, 2:55 PM

## 2023-10-24 NOTE — Progress Notes (Signed)
 PROGRESS NOTE Edgar Brewer  FMW:[id number] DOB: Dec 09, 1960 DOA: 10/19/2023 PCP: Katrinka Garnette KIDD, MD  Brief Narrative/Hospital Course: Edgar Brewer is a 63 y.o. male with PMH of 63 y.o. 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  Patient had amputation 2 weeks ago, noticed purulent discharge and was given 1 week of antibiotics as outpatient.  Patient was having chills so brought to ED On presentation febrile at 100.8, tachycardic and tachypneic, hypoxic requiring 2 L of oxygen.  Labs with potassium of 5.2, creatinine 8.73, lactic acid 5.6> 0.8,US  LLE W/O DVT. XR L foot w/o acute findings or OM.  Blood cultures were drawn. Patient was started on vancomycin , cefepime  and Flagyl .  Podiatry was consulted. Seen by podiatry,case was discussed with vascular surgery MRI >> postsurgical changes and incomplete wound healing dorsally, no organized fluid collection, there was nonspecific T2 hyperintensity within the remaining second proximal phalangeal base and second metatarsal head which can be either postsurgical or secondary to early osteomyelitis.  No other specific evidence of osteomyelitis noted. 9/29: Remained hemodynamically stable, patient had transmetatarsal potation left foot 9/29   Subjective: Seen and examined today He is alert awake resting comfortably, complains of pain on his left lower extremity Overnight afebrile BP stable on room air Labs reviewed-CBC with mild leukocytosis hemoglobin 10.7, BMP mild hyperkalemia BUN 68 creatinine 10.3  Assessment and plan:  Sepsis 2/2 surgical wound infection left foot S/p recent amputation of L 2nd toe through proximal phalanx. Podiatry was consulted-MRI for with postsurgical changes and incomplete wound healing dorsally, no organized fluid collection, there  was nonspecific T2 hyperintensity within the remaining second proximal phalangeal base and second metatarsal head which can be either postsurgical or secondary to early osteomyelitis.  No other specific evidence of osteomyelitis noted. patient had transmetatarsal potation left foot 9/29  Continue with vancomycin  and cefepime -culture pending from surgical site 9/29 Discussed with podiatry plan for PT eval today as per his mom not sure if he will be able to go home with NWB to LLE.  Will await for PT recommendation.  Planning for dressing change tomorrow   Diabetes mellitus type 2 with complications: Continue with Lantus  and SSI Recent Labs  Lab 10/19/23 1449 10/19/23 1723 10/23/23 1724 10/23/23 1829 10/23/23 2023 10/24/23 0427 10/24/23 0740  GLUCAP  --    < > 85 88 124* 322* 229*  HGBA1C 6.3*  --   --   --   --   --   --    < > = values in this interval not displayed.     ESRD on dialysis MWF Metabolic bone disease Mild hyperkalemia: Nephro following last HD 9/29.  Continue Renvela  Sensipar .  Add Lokelma  x 1   Neuropathy: Continue with home gabapentin    PAD Patient with history of significant PAD, will be high risk in terms of wound healing or progressive amputation. Podiatry is communicating with vascular surgery. Continue with home Crestor   Class I Obesity w/ Body mass index is 32.58 kg/m.: Will benefit with PCP follow-up, weight loss,healthy lifestyle and outpatient sleep eval if not done.  Mobility: PT Orders: Active  PT Follow up Rec:    DVT prophylaxis:  Code Status:   Code Status: Full Code Family Communication: plan of care discussed with patient at bedside. Patient status is: Remains hospitalized because of  severity of illness Level of care: Med-Surg   Dispo: The patient is from: home alone            Anticipated disposition: TBD Objective: Vitals last 24 hrs: Vitals:   10/23/23 2020 10/23/23 2147 10/24/23 0423 10/24/23 0740  BP: (!) 127/49 126/64 (!)  118/51 133/64  Pulse: 91 95 86 87  Resp: 20 14 20 18   Temp: 98.8 F (37.1 C) 98.5 F (36.9 C)  97.8 F (36.6 C)  TempSrc: Oral   Oral  SpO2: 91% 90% 92% 94%  Weight:      Height:        Physical Examination: General exam: alert awake, oriented, older than stated age HEENT:Oral mucosa moist, Ear/Nose WNL grossly Respiratory system: Bilaterally clear BS,no use of accessory muscle Cardiovascular system: S1 & S2 +, No JVD. Gastrointestinal system: Abdomen soft,NT,ND, BS+ Nervous System: Alert, awake, moving all extremities,and following commands. Extremities: extremities warm, leg edema neg, left foot with dressing in place C/C/I Skin: No rashes,no icterus. MSK: Normal muscle bulk,tone, power   Medications reviewed:  Scheduled Meds:  (feeding supplement) PROSource Plus  30 mL Oral BID BM   Chlorhexidine  Gluconate Cloth  6 each Topical Q0600   cinacalcet   60 mg Oral Q T,Th,Sat-1800   darbepoetin (ARANESP ) injection - DIALYSIS  60 mcg Subcutaneous Q Mon-1800   gabapentin   100 mg Oral TID   insulin  aspart  0-9 Units Subcutaneous TID WC   insulin  glargine  50 Units Subcutaneous Daily   metroNIDAZOLE   500 mg Oral Q12H   multivitamin  1 tablet Oral QHS   mupirocin  cream   Topical QODAY   pantoprazole   40 mg Oral Daily   rosuvastatin   10 mg Oral Daily   sevelamer  carbonate  1,600 mg Oral TID WC   Continuous Infusions:  ceFEPime  (MAXIPIME ) IV 1 g (10/23/23 1850)   vancomycin      Diet: Diet Order             Diet Carb Modified Fluid consistency: Thin; Room service appropriate? Yes  Diet effective now                    Data Reviewed: I have personally reviewed following labs and imaging studies ( see epic result tab) CBC: Recent Labs  Lab 10/19/23 0900 10/19/23 1449 10/23/23 0915 10/23/23 1538 10/24/23 0735  WBC 8.6 7.9 9.1  --  12.1*  NEUTROABS 7.5  --   --   --   --   HGB 11.1* 10.4* 9.0* 10.2* 10.7*  HCT 34.2* 32.7* 28.0* 30.0* 32.8*  MCV 97.7 98.8 95.6  --   94.5  PLT 208 199 231  --  276   CMP: Recent Labs  Lab 10/19/23 0900 10/19/23 1449 10/23/23 0915 10/23/23 1538 10/24/23 0735  NA 136  --  133* 133* 132*  K 5.2*  --  5.2* 4.1 5.2*  CL 92*  --  92* 95* 92*  CO2 27  --  21*  --  24  GLUCOSE 102*  --  84 86 238*  BUN 27*  --  104* 42* 68*  CREATININE 8.73* 9.36* 15.16* 8.60* 10.43*  CALCIUM  9.8  --  9.5  --  9.8   GFR: Estimated Creatinine Clearance: 8.1 mL/min (A) (by C-G formula based on SCr of 10.43 mg/dL (H)). Recent Labs  Lab 10/19/23 0900 10/24/23 0735  AST 19 22  ALT 19 21  ALKPHOS 255* 350*  BILITOT 1.1 0.8  PROT 7.1 7.1  ALBUMIN 3.0* 2.7*    Recent Labs  Lab 10/19/23 0900  LIPASE 14   No results for input(s): AMMONIA in the last 168 hours. Coagulation Profile:  Recent Labs  Lab 10/19/23 0900  INR 1.1   Unresulted Labs (From admission, onward)     Start     Ordered   10/19/23 0834  Urinalysis, w/ Reflex to Culture (Infection Suspected) -Urine, Clean Catch  (Septic presentation on arrival (screening labs, nursing and treatment orders for obvious sepsis))  ONCE - URGENT,   URGENT       Question:  Specimen Source  Answer:  Urine, Clean Catch   10/19/23 0839   Signed and Held  Renal function panel  Tomorrow morning,   R       Question:  Specimen collection method  Answer:  Lab=Lab collect   Signed and Held   Signed and Held  CBC  Tomorrow morning,   R       Question:  Specimen collection method  Answer:  Lab=Lab collect   Signed and Held           Antimicrobials/Microbiology: Anti-infectives (From admission, onward)    Start     Dose/Rate Route Frequency Ordered Stop   10/23/23 1800  vancomycin  (VANCOCIN ) IVPB 1000 mg/200 mL premix        1,000 mg 200 mL/hr over 60 Minutes Intravenous Every M-W-F (Hemodialysis) 10/23/23 1457     10/20/23 1800  ceFEPIme  (MAXIPIME ) 1 g in sodium chloride  0.9 % 100 mL IVPB        1 g 200 mL/hr over 30 Minutes Intravenous Every 24 hours 10/19/23 1311     10/20/23  1200  vancomycin  (VANCOCIN ) IVPB 1000 mg/200 mL premix  Status:  Discontinued        1,000 mg 200 mL/hr over 60 Minutes Intravenous Every M-W-F (Hemodialysis) 10/20/23 0725 10/23/23 1457   10/19/23 2200  metroNIDAZOLE  (FLAGYL ) tablet 500 mg        500 mg Oral Every 12 hours 10/19/23 1235     10/19/23 1310  vancomycin  variable dose per unstable renal function (pharmacist dosing)  Status:  Discontinued         Does not apply See admin instructions 10/19/23 1311 10/20/23 0725   10/19/23 0845  ceFEPIme  (MAXIPIME ) 2 g in sodium chloride  0.9 % 100 mL IVPB        2 g 200 mL/hr over 30 Minutes Intravenous  Once 10/19/23 0839 10/19/23 1347   10/19/23 0845  metroNIDAZOLE  (FLAGYL ) IVPB 500 mg        500 mg 100 mL/hr over 60 Minutes Intravenous  Once 10/19/23 0839 10/19/23 1519   10/19/23 0845  vancomycin  (VANCOCIN ) IVPB 1000 mg/200 mL premix  Status:  Discontinued        1,000 mg 200 mL/hr over 60 Minutes Intravenous  Once 10/19/23 0839 10/19/23 0839   10/19/23 0845  vancomycin  (VANCOREADY) IVPB 2000 mg/400 mL        2,000 mg 200 mL/hr over 120 Minutes Intravenous STAT 10/19/23 0839 10/19/23 1207         Component Value Date/Time   SDES TISSUE LEFT FOOT 10/23/2023 1640   SPECREQUEST NONE 10/23/2023 1640   CULT  10/23/2023 1640    TOO YOUNG TO READ Performed at [facility], [address], [place], [place] [address]    REPTSTATUS PENDING 10/23/2023 1640    Procedures: Procedure(s) (LRB): AMPUTATION, FOOT, TRANSMETATARSAL (Left)   Mennie LAMY, MD [facility] 10/24/2023, 10:23 AM

## 2023-10-24 NOTE — Progress Notes (Signed)
 CSW attempted to speak with patient about PT recs of SNF. Pt was in the bathroom, will try again tomorrow.

## 2023-10-24 NOTE — Progress Notes (Signed)
 [facility] Progress Note   Subjective:   Seen in room - s/p HD yesterday followed by L TMA - appears all went ok. Intra-op Cx pending.  Objective Vitals:   10/23/23 2020 10/23/23 2147 10/24/23 0423 10/24/23 0740  BP: (!) 127/49 126/64 (!) 118/51 133/64  Pulse: 91 95 86 87  Resp: 20 14 20 18   Temp: 98.8 F (37.1 C) 98.5 F (36.9 C)  97.8 F (36.6 C)  TempSrc: Oral   Oral  SpO2: 91% 90% 92% 94%  Weight:      Height:       Physical Exam General: Well appearing man, NAD. Room air. Heart: RRR Lungs: CTAB Abdomen: soft Extremities: no RLE edema, LLE with erythema, foot bandaged s/p TMA Dialysis Access: LUE AVF +t/b    Additional Objective Labs: Basic Metabolic Panel: Recent Labs  Lab 10/19/23 0900 10/19/23 1449 10/23/23 0915 10/23/23 1538 10/24/23 0735  NA 136  --  133* 133* 132*  K 5.2*  --  5.2* 4.1 5.2*  CL 92*  --  92* 95* 92*  CO2 27  --  21*  --  24  GLUCOSE 102*  --  84 86 238*  BUN 27*  --  104* 42* 68*  CREATININE 8.73*   < > 15.16* 8.60* 10.43*  CALCIUM  9.8  --  9.5  --  9.8   < > = values in this interval not displayed.   Liver Function Tests: Recent Labs  Lab 10/19/23 0900 10/24/23 0735  AST 19 22  ALT 19 21  ALKPHOS 255* 350*  BILITOT 1.1 0.8  PROT 7.1 7.1  ALBUMIN 3.0* 2.7*   Recent Labs  Lab 10/19/23 0900  LIPASE 14   CBC: Recent Labs  Lab 10/19/23 0900 10/19/23 1449 10/23/23 0915 10/23/23 1538 10/24/23 0735  WBC 8.6 7.9 9.1  --  12.1*  NEUTROABS 7.5  --   --   --   --   HGB 11.1* 10.4* 9.0* 10.2* 10.7*  HCT 34.2* 32.7* 28.0* 30.0* 32.8*  MCV 97.7 98.8 95.6  --  94.5  PLT 208 199 231  --  276   Blood Culture    Component Value Date/Time   SDES TISSUE LEFT FOOT 10/23/2023 1640   SPECREQUEST NONE 10/23/2023 1640   CULT  10/23/2023 1640    TOO YOUNG TO READ Performed at [facility], [address], [place], [place] [address]    REPTSTATUS PENDING 10/23/2023 1640   Studies/Results: DG Foot 2  Views Left Result Date: 10/23/2023 CLINICAL DATA:  Postop EXAM: LEFT FOOT - 2 VIEW COMPARISON:  10/19/2023 FINDINGS: Interval transmetatarsal amputation of the first through fifth digits. Cutaneous staples. Vascular calcifications. IMPRESSION: Interval transmetatarsal amputation of the first through fifth digits. Electronically Signed   By: Luke Bun M.D.   On: 10/23/2023 19:52   Medications:  ceFEPime  (MAXIPIME ) IV 1 g (10/23/23 1850)   vancomycin       (feeding supplement) PROSource Plus  30 mL Oral BID BM   Chlorhexidine  Gluconate Cloth  6 each Topical Q0600   cinacalcet   60 mg Oral Q T,Th,Sat-1800   darbepoetin (ARANESP ) injection - DIALYSIS  60 mcg Subcutaneous Q Mon-1800   gabapentin   100 mg Oral TID   insulin  aspart  0-9 Units Subcutaneous TID WC   insulin  glargine  50 Units Subcutaneous Daily   metroNIDAZOLE   500 mg Oral Q12H   multivitamin  1 tablet Oral QHS   mupirocin  cream   Topical QODAY  pantoprazole   40 mg Oral Daily   rosuvastatin   10 mg Oral Daily   sevelamer  carbonate  1,600 mg Oral TID WC    Dialysis Orders MWF - NW 4hr, 400/A1.5, EDW 96.8kg, 2K/2.5Ca bath, AVF, Heparin  2600 - no ESA, Hgb > goal - Calcitriol 1.75 TIW   Assessment/Plan: Sepsis/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 On IV Vanc/Cefepime . Blood cultures NGTD. Podiatry consulted, s/p MRI with plan for prox 2nd toe amp v. TMA today. ESRD.  Continue usual MWF HD - for HD tomorrow. Hypertension/volume. BP controlled, min LE edema, but low Na - inching EDW down. Anemia. Hgb variable - monitor without ESA for now. Metabolic bone disease.  CorrCa high sided, VDRA on hold. Phos pending. Continue home binders/cinacalcet . Nutrition: Alb low, continue supplements. T2DM: Insulin  per primary. DVT prophylaxis: Prefer heparin  over Lovenox  in ESRD patient, AC on hold s/p surgery.  Izetta Boehringer, PA-C 10/24/2023, 12:29 PM  [facility]

## 2023-10-24 NOTE — Progress Notes (Signed)
 Orthopedic Tech Progress Note Patient Details:  Edgar Brewer December 31, 1960 [id number]  Ortho Devices Type of Ortho Device: Postop shoe/boot Ortho Device/Splint Location: lle Ortho Device/Splint Interventions: Ordered, Application, Adjustment   Post Interventions Patient Tolerated: Well Instructions Provided: Care of device, Adjustment of device  Chandra Dorn PARAS 10/24/2023, 6:49 AM

## 2023-10-24 NOTE — Progress Notes (Signed)
  Subjective:  Patient ID: Edgar Brewer, male    DOB: August 30, 1960,  MRN: [id number]  Chief Complaint  Patient presents with   Altered Mental Status    DOS: 10/23/23 Procedure: 1.  Transmetatarsal amputation, left foot   63 y.o. male seen for post op check. He reports pain in left foot. Discussed need for no weight on left foot for several weeks.   Review of Systems: Negative except as noted in the HPI. Denies N/V/F/Ch.   Objective:   Constitutional Well developed. Well nourished.  Vascular Foot warm and well perfused. Capillary refill normal to all digits.   No calf pain with palpation  Neurologic Normal speech. Oriented to person, place, and time. Epicritic sensation diminished to left foot  Dermatologic Dressing c/d/I LLE  Orthopedic: S/p L foot TMA   Radiographs: Interval transmetatarsal amputation of the first through fifth digits.  Pathology: pending  Micro: Rare GNR  Assessment:   1. Sepsis, due to unspecified organism, unspecified whether acute organ dysfunction present (HCC)   2. Hypoxia   3. 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  POD # 1 s/p TMA left foot -Progressing as expected post op, continue pain control, PT today -XR: expected changes -WB Status: NWB in post op shoe LLE -Sutures: remain intact. -Medications/ABX: recommend de escalate IV abx to PO in next 24-48 hrs -Dressing: leave c/d/I likely change tmrw - F/u Plan: may require rehab placement pending PT eval, will see tmrw        [name], DPM [facility] / [facility]

## 2023-10-25 DIAGNOSIS — A419 Sepsis, unspecified organism: Secondary | ICD-10-CM | POA: Diagnosis not present

## 2023-10-25 LAB — CBC
HCT: 29 % — ABNORMAL LOW (ref 39.0–52.0)
HCT: 28.3 % — ABNORMAL LOW (ref 39.0–52.0)
Hemoglobin: 9.5 g/dL — ABNORMAL LOW (ref 13.0–17.0)
Hemoglobin: 9.3 g/dL — ABNORMAL LOW (ref 13.0–17.0)
MCH: 31.1 pg (ref 26.0–34.0)
MCH: 31.7 pg (ref 26.0–34.0)
MCHC: 32.8 g/dL (ref 30.0–36.0)
MCHC: 32.9 g/dL (ref 30.0–36.0)
MCV: 95.1 fL (ref 80.0–100.0)
MCV: 96.6 fL (ref 80.0–100.0)
Platelets: 261 K/uL (ref 150–400)
Platelets: 272 K/uL (ref 150–400)
RBC: 3.05 MIL/uL — ABNORMAL LOW (ref 4.22–5.81)
RBC: 2.93 MIL/uL — ABNORMAL LOW (ref 4.22–5.81)
RDW: 13.8 % (ref 11.5–15.5)
RDW: 13.7 % (ref 11.5–15.5)
WBC: 9.4 K/uL (ref 4.0–10.5)
WBC: 8.6 K/uL (ref 4.0–10.5)
nRBC: 0 % (ref 0.0–0.2)
nRBC: 0 % (ref 0.0–0.2)

## 2023-10-25 LAB — RENAL FUNCTION PANEL
Albumin: 2.4 g/dL — ABNORMAL LOW (ref 3.5–5.0)
Anion gap: 17 — ABNORMAL HIGH (ref 5–15)
BUN: 89 mg/dL — ABNORMAL HIGH (ref 8–23)
CO2: 22 mmol/L (ref 22–32)
Calcium: 9.1 mg/dL (ref 8.9–10.3)
Chloride: 95 mmol/L — ABNORMAL LOW (ref 98–111)
Creatinine, Ser: 12.94 mg/dL — ABNORMAL HIGH (ref 0.61–1.24)
GFR, Estimated: 4 mL/min — ABNORMAL LOW (ref >60)
Glucose, Bld: 231 mg/dL — ABNORMAL HIGH (ref 70–99)
Phosphorus: 8.6 mg/dL — ABNORMAL HIGH (ref 2.5–4.6)
Potassium: 5.5 mmol/L — ABNORMAL HIGH (ref 3.5–5.1)
Sodium: 134 mmol/L — ABNORMAL LOW (ref 135–145)

## 2023-10-25 LAB — BASIC METABOLIC PANEL WITH GFR
Anion gap: 18 — ABNORMAL HIGH (ref 5–15)
BUN: 85 mg/dL — ABNORMAL HIGH (ref 8–23)
CO2: 23 mmol/L (ref 22–32)
Calcium: 9.3 mg/dL (ref 8.9–10.3)
Chloride: 94 mmol/L — ABNORMAL LOW (ref 98–111)
Creatinine, Ser: 12.52 mg/dL — ABNORMAL HIGH (ref 0.61–1.24)
GFR, Estimated: 4 mL/min — ABNORMAL LOW (ref >60)
Glucose, Bld: 267 mg/dL — ABNORMAL HIGH (ref 70–99)
Potassium: 5.5 mmol/L — ABNORMAL HIGH (ref 3.5–5.1)
Sodium: 135 mmol/L (ref 135–145)

## 2023-10-25 LAB — GLUCOSE, CAPILLARY
Glucose-Capillary: 241 mg/dL — ABNORMAL HIGH (ref 70–99)
Glucose-Capillary: 130 mg/dL — ABNORMAL HIGH (ref 70–99)
Glucose-Capillary: 302 mg/dL — ABNORMAL HIGH (ref 70–99)
Glucose-Capillary: 336 mg/dL — ABNORMAL HIGH (ref 70–99)

## 2023-10-25 LAB — SURGICAL PATHOLOGY

## 2023-10-25 MED ORDER — VANCOMYCIN HCL IN DEXTROSE 1-5 GM/200ML-% IV SOLN
INTRAVENOUS | Status: AC
  Filled 2023-10-25: qty 200

## 2023-10-25 MED ORDER — ASPIRIN 81 MG PO TBEC
81.0000 mg | DELAYED_RELEASE_TABLET | Freq: Every day | ORAL | Status: DC
  Administered 2023-10-25 – 2023-11-07 (×12): 81 mg via ORAL
  Filled 2023-10-25 (×13): qty 1

## 2023-10-25 MED ORDER — INFLUENZA VIRUS VACC SPLIT PF (FLUZONE) 0.5 ML IM SUSY
0.5000 mL | PREFILLED_SYRINGE | INTRAMUSCULAR | Status: AC
  Administered 2023-10-26: 0.5 mL via INTRAMUSCULAR
  Filled 2023-10-25: qty 0.5

## 2023-10-25 NOTE — Progress Notes (Signed)
 PROGRESS NOTE Edgar Brewer  FMW:[id number] DOB: Sep 21, 1960 DOA: 10/19/2023 PCP: Katrinka Garnette KIDD, MD  Brief Narrative/Hospital Course: Edgar Brewer is a 63 y.o. male with PMH of 63 y.o. 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  Patient had amputation 2 weeks ago, noticed purulent discharge and was given 1 week of antibiotics as outpatient.  Patient was having chills so brought to ED On presentation febrile at 100.8, tachycardic and tachypneic, hypoxic requiring 2 L of oxygen.  Labs with potassium of 5.2, creatinine 8.73, lactic acid 5.6> 0.8,US  LLE W/O DVT. XR L foot w/o acute findings or OM.  Blood cultures were drawn. Patient was started on vancomycin , cefepime  and Flagyl .  Podiatry was consulted. Seen by podiatry,case was discussed with vascular surgery MRI >> postsurgical changes and incomplete wound healing dorsally, no organized fluid collection, there was nonspecific T2 hyperintensity within the remaining second proximal phalangeal base and second metatarsal head which can be either postsurgical or secondary to early osteomyelitis.  No other specific evidence of osteomyelitis noted. 9/29: Remained hemodynamically stable, patient had transmetatarsal potation left foot 9/29   Subjective: Seen and examined in dialysis. No new complaints Overnight afebrile BP stable labs shows hyperkalemia 5.5 leukocytosis resolved hemoglobin 9.5  Assessment and plan:  Sepsis 2/2 surgical wound infection left foot S/p recent amputation of L 2nd toe through proximal phalanx. Podiatry was consulted-MRI for with postsurgical changes and incomplete wound healing dorsally, no organized fluid collection, there was nonspecific T2 hyperintensity within the remaining second proximal phalangeal base and second metatarsal  head which can be either postsurgical or secondary to early osteomyelitis.  No other specific evidence of osteomyelitis noted. S/ p Transmetatarsal amputation left foot 9/29 -OR culture Gram stain gram-negative rod, culture is still in process Continue with vancomycin  and cefepime  pending further culture-plan for antibiotic x 5 days. .  I changed the dressing today next change will be on Monday at rehab-okay for discharge. Discussed with podiatry-PT has evaluated, planning for SNF, continue NWB to LLE   Diabetes mellitus type 2 with complications: Poorly controlled hyperglycemia, continue with Lantus  and SSI-and adjust Recent Labs  Lab 10/19/23 1449 10/19/23 1723 10/24/23 0740 10/24/23 1112 10/24/23 1652 10/24/23 2106 10/25/23 0729  GLUCAP  --    < > 229* 201* 201* 169* 241*  HGBA1C 6.3*  --   --   --   --   --   --    < > = values in this interval not displayed.     ESRD on dialysis MWF Metabolic bone disease Mild hyperkalemia: Nephro following last HD 9/29.  Continue Renvela  Sensipar .  Continue dialysis today   Neuropathy: Continue with home gabapentin    PAD Patient with history of significant PAD, will be high risk in terms of wound healing or progressive amputation. Podiatry communicated with vascular surgery. Continue with home Crestor   Class I Obesity w/ Body mass index is 32.85 kg/m.: Will benefit with PCP follow-up, weight loss,healthy lifestyle and outpatient sleep eval if not done.  Mobility: PT Orders: Active PT Follow up Rec: Skilled Nursing-Short Term Rehab (<3 Hours/Day)10/24/2023 1400   DVT prophylaxis:  Code Status:   Code Status: Full Code Family Communication: plan of care discussed with patient at bedside. Patient status is: Remains hospitalized because of severity of illness Level of care: Med-Surg  Dispo: The patient is from: home alone            Anticipated disposition: Plan for skilled nursing facility Objective: Vitals last 24 hrs: Vitals:    10/25/23 1005 10/25/23 1020 10/25/23 1030 10/25/23 1035  BP: (!) 108/50 (!) 98/51 (!) 97/51 (!) 97/51  Pulse: 76 77 76 77  Resp: (!) 21 (!) 22 (!) 21 (!) 23  Temp:      TempSrc:      SpO2: 93% 93% 95% 94%  Weight:      Height:        Physical Examination: General exam: alert awake, oriented HEENT:Oral mucosa moist, Ear/Nose WNL grossly Respiratory system: Bilaterally clear BS,no use of accessory muscle Cardiovascular system: S1 & S2 +, No JVD. Gastrointestinal system: Abdomen soft,NT,ND, BS+ Nervous System: Alert, awake, moving all extremities,and following commands. Extremities: extremities warm, leg edema neg, left foot with dressing in place C/C/I Skin: No rashes,no icterus. MSK: Normal muscle bulk,tone, power   Medications reviewed:  Scheduled Meds:  (feeding supplement) PROSource Plus  30 mL Oral BID BM   Chlorhexidine  Gluconate Cloth  6 each Topical Q0600   cinacalcet   60 mg Oral Q T,Th,Sat-1800   darbepoetin (ARANESP ) injection - DIALYSIS  60 mcg Subcutaneous Q Mon-1800   gabapentin   100 mg Oral TID   [START ON 10/26/2023] influenza vac split trivalent PF  0.5 mL Intramuscular Tomorrow-1000   insulin  aspart  0-9 Units Subcutaneous TID WC   insulin  glargine  50 Units Subcutaneous Daily   metroNIDAZOLE   500 mg Oral Q12H   multivitamin  1 tablet Oral QHS   mupirocin  cream   Topical QODAY   pantoprazole   40 mg Oral Daily   rosuvastatin   10 mg Oral Daily   sevelamer  carbonate  1,600 mg Oral TID WC   Continuous Infusions:  ceFEPime  (MAXIPIME ) IV 1 g (10/24/23 1734)   vancomycin      Diet: Diet Order             Diet Carb Modified Fluid consistency: Thin; Room service appropriate? Yes  Diet effective now                    Data Reviewed: I have personally reviewed following labs and imaging studies ( see epic result tab) CBC: Recent Labs  Lab 10/19/23 0900 10/19/23 1449 10/23/23 0915 10/23/23 1538 10/24/23 0735 10/25/23 0359 10/25/23 0917  WBC 8.6  7.9 9.1  --  12.1* 9.4 8.6  NEUTROABS 7.5  --   --   --   --   --   --   HGB 11.1* 10.4* 9.0* 10.2* 10.7* 9.5* 9.3*  HCT 34.2* 32.7* 28.0* 30.0* 32.8* 29.0* 28.3*  MCV 97.7 98.8 95.6  --  94.5 95.1 96.6  PLT 208 199 231  --  276 261 272   CMP: Recent Labs  Lab 10/19/23 0900 10/19/23 1449 10/23/23 0915 10/23/23 1538 10/24/23 0735 10/25/23 0359 10/25/23 0917  NA 136  --  133* 133* 132* 135 134*  K 5.2*  --  5.2* 4.1 5.2* 5.5* 5.5*  CL 92*  --  92* 95* 92* 94* 95*  CO2 27  --  21*  --  24 23 22   GLUCOSE 102*  --  84 86 238* 267* 231*  BUN 27*  --  104* 42* 68* 85* 89*  CREATININE 8.73*   < > 15.16* 8.60* 10.43* 12.52* 12.94*  CALCIUM  9.8  --  9.5  --  9.8 9.3 9.1  PHOS  --   --   --   --  7.5*  --  8.6*   < > = values in this interval not displayed.   GFR: Estimated Creatinine Clearance: 6.5 mL/min (A) (by C-G formula based on SCr of 12.94 mg/dL (H)). Recent Labs  Lab 10/19/23 0900 10/24/23 0735 10/25/23 0917  AST 19 22  --   ALT 19 21  --   ALKPHOS 255* 350*  --   BILITOT 1.1 0.8  --   PROT 7.1 7.1  --   ALBUMIN 3.0* 2.7* 2.4*    Recent Labs  Lab 10/19/23 0900  LIPASE 14   No results for input(s): AMMONIA in the last 168 hours. Coagulation Profile:  Recent Labs  Lab 10/19/23 0900  INR 1.1   Unresulted Labs (From admission, onward)     Start     Ordered   10/25/23 0500  Basic metabolic panel with GFR  Daily,   R     Question:  Specimen collection method  Answer:  Lab=Lab collect   10/24/23 1024   10/25/23 0500  CBC  Daily,   R     Question:  Specimen collection method  Answer:  Lab=Lab collect   10/24/23 1024   10/19/23 0834  Urinalysis, w/ Reflex to Culture (Infection Suspected) -Urine, Clean Catch  (Septic presentation on arrival (screening labs, nursing and treatment orders for obvious sepsis))  ONCE - URGENT,   URGENT       Question:  Specimen Source  Answer:  Urine, Clean Catch   10/19/23 0839            Antimicrobials/Microbiology: Anti-infectives (From admission, onward)    Start     Dose/Rate Route Frequency Ordered Stop   10/23/23 1800  vancomycin  (VANCOCIN ) IVPB 1000 mg/200 mL premix        1,000 mg 200 mL/hr over 60 Minutes Intravenous Every M-W-F (Hemodialysis) 10/23/23 1457     10/20/23 1800  ceFEPIme  (MAXIPIME ) 1 g in sodium chloride  0.9 % 100 mL IVPB        1 g 200 mL/hr over 30 Minutes Intravenous Every 24 hours 10/19/23 1311     10/20/23 1200  vancomycin  (VANCOCIN ) IVPB 1000 mg/200 mL premix  Status:  Discontinued        1,000 mg 200 mL/hr over 60 Minutes Intravenous Every M-W-F (Hemodialysis) 10/20/23 0725 10/23/23 1457   10/19/23 2200  metroNIDAZOLE  (FLAGYL ) tablet 500 mg        500 mg Oral Every 12 hours 10/19/23 1235     10/19/23 1310  vancomycin  variable dose per unstable renal function (pharmacist dosing)  Status:  Discontinued         Does not apply See admin instructions 10/19/23 1311 10/20/23 0725   10/19/23 0845  ceFEPIme  (MAXIPIME ) 2 g in sodium chloride  0.9 % 100 mL IVPB        2 g 200 mL/hr over 30 Minutes Intravenous  Once 10/19/23 0839 10/19/23 1347   10/19/23 0845  metroNIDAZOLE  (FLAGYL ) IVPB 500 mg        500 mg 100 mL/hr over 60 Minutes Intravenous  Once 10/19/23 0839 10/19/23 1519   10/19/23 0845  vancomycin  (VANCOCIN ) IVPB 1000 mg/200 mL premix  Status:  Discontinued        1,000 mg 200 mL/hr over 60 Minutes Intravenous  Once 10/19/23 0839 10/19/23 0839   10/19/23 0845  vancomycin  (VANCOREADY) IVPB 2000 mg/400 mL        2,000 mg 200 mL/hr over 120 Minutes Intravenous STAT 10/19/23 0839 10/19/23 1207  Component Value Date/Time   SDES TISSUE LEFT FOOT 10/23/2023 1640   SPECREQUEST NONE 10/23/2023 1640   CULT  10/23/2023 1640    CULTURE REINCUBATED FOR BETTER GROWTH Performed at [facility], [address], [place], [place] [address]    REPTSTATUS PENDING 10/23/2023 1640    Procedures: Procedure(s) (LRB): AMPUTATION, FOOT,  TRANSMETATARSAL (Left)   Mennie LAMY, MD [facility] 10/25/2023, 12:10 PM

## 2023-10-25 NOTE — Progress Notes (Signed)
 Received patient in bed to unit.  Alert and oriented.  Informed consent signed and in chart.   TX duration:3:30  Patient tolerated well.  Transported back to the room  Alert, without acute distress.  Hand-off given to patient's nurse.   Access used: catheter Access issues: NA  Total UF removed: 2.3 Medication(s) given: VANC Post HD weight: 96 KG   10/25/23 1308  Vitals  Temp 97.7 F (36.5 C)  Temp Source Oral  BP 93/61  MAP (mmHg) 72  Pulse Rate 80  ECG Heart Rate 81  Resp (!) 26  Weight 96 kg  Type of Weight Post-Dialysis  Oxygen Therapy  SpO2 95 %  During Treatment Monitoring  Blood Flow Rate (mL/min) 199 mL/min  Arterial Pressure (mmHg) -90.9 mmHg  Venous Pressure (mmHg) 136.15 mmHg  TMP (mmHg) -11.11 mmHg  Ultrafiltration Rate (mL/min) 0 mL/min  Dialysate Flow Rate (mL/min) 299 ml/min  Dialysate Potassium Concentration 2  Dialysate Calcium  Concentration 2.5  Duration of HD Treatment -hour(s) 3.5 hour(s)  Cumulative Fluid Removed (mL) per Treatment  2268.67  HD Safety Checks Performed Yes  Intra-Hemodialysis Comments Tx completed  Post Treatment  Dialyzer Clearance Lightly streaked  Hemodialysis Intake (mL) 0 mL  Liters Processed 84  Fluid Removed (mL) 2300 mL  Tolerated HD Treatment Yes  AVG/AVF Arterial Site Held (minutes) 5 minutes  AVG/AVF Venous Site Held (minutes) 5 minutes  Fistula / Graft Left Upper arm Arteriovenous fistula  Placement Date/Time: 05/17/20 2115   Placed prior to admission: Yes  Orientation: Left  Access Location: Upper arm  Access Type: Arteriovenous fistula  Site Condition No complications  Fistula / Graft Assessment Present;Thrill  Status Deaccessed  Needle Size 15  Drainage Description None    [facility]

## 2023-10-25 NOTE — NC FL2 (Signed)
 [place]  MEDICAID FL2 LEVEL OF CARE FORM     IDENTIFICATION  Patient Name: Edgar Brewer Birthdate: February 10, 1960 Sex: male Admission Date (Current Location): 10/19/2023  [facility] and [place] Number:  Producer, television/film/video and Address:  [facility], [address] [place], [place] [address]      Provider Number: [id number]  Attending Physician Name and Address:  Christobal Guadalajara, MD  Relative Name and Phone Number:  Antwian Santaana (mother),[phone]    Current Level of Care: Hospital Recommended Level of Care: Skilled Nursing Facility Prior Approval Number:    Date Approved/Denied:   PASRR Number: [id number]  Discharge Plan: SNF    Current Diagnoses: Patient Active Problem List   Diagnosis Date Noted   Pyogenic inflammation of bone (HCC) 10/20/2023   PAD (peripheral artery disease) 10/20/2023   Neuropathy    Sepsis (HCC) 10/19/2023   Hyperkalemia 09/20/2023   Hyponatremia 09/20/2023   Gangrene of left foot (HCC) 09/19/2023   Fatty liver 02/10/2022   Wound of left leg 05/08/2020   Left leg cellulitis 05/08/2020   GERD (gastroesophageal reflux disease) 03/25/2020   Aortic stenosis 02/25/2020   Type 2 diabetes mellitus with diabetic neuropathy, unspecified (HCC) 02/25/2020   History of stroke 02/25/2020   Gallbladder abscess 11/15/2018   Elevated LFTs 10/18/2018   ESRD on dialysis (HCC) 10/03/2018   Other disorders of phosphorus metabolism 01/19/2018   Secondary hyperparathyroidism of renal origin 09/27/2017   CKD (chronic kidney disease), stage IV (HCC) 01/14/2015   Diabetes mellitus type 2 with complications (HCC) 01/14/2015   Obesity 01/14/2015   Essential hypertension 01/14/2015   Anemia of chronic kidney failure 01/14/2015   Chronic diastolic CHF (congestive heart failure) (HCC) 01/13/2015   Varicose veins of lower extremities with complications 12/24/2014   Chronic venous insufficiency 10/22/2014    Orientation RESPIRATION BLADDER Height  & Weight     Self, Time, Situation, Place  Normal Continent Weight: 211 lb 10.3 oz (96 kg) Height:  5' 8 (172.7 cm)  BEHAVIORAL SYMPTOMS/MOOD NEUROLOGICAL BOWEL NUTRITION STATUS      Continent Diet (see dc summary)  AMBULATORY STATUS COMMUNICATION OF NEEDS Skin   Extensive Assist Verbally Surgical wounds                       Personal Care Assistance Level of Assistance  Bathing, Feeding, Dressing Bathing Assistance: Limited assistance Feeding assistance: Independent Dressing Assistance: Limited assistance     Functional Limitations Info  Sight, Hearing, Speech Sight Info: Impaired Hearing Info: Adequate Speech Info: Adequate    SPECIAL CARE FACTORS FREQUENCY  PT (By licensed PT), OT (By licensed OT)     PT Frequency: 5x a week OT Frequency: 5x a week            Contractures Contractures Info: Not present    Additional Factors Info  Code Status, Allergies Code Status Info: FULL Allergies Info: Codeine  Clindamycin /lincomycin           Current Medications (10/25/2023):  This is the current hospital active medication list Current Facility-Administered Medications  Medication Dose Route Frequency Provider Last Rate Last Admin   (feeding supplement) PROSource Plus liquid 30 mL  30 mL Oral BID BM Stovall, Kathryn R, PA-C   30 mL at 10/25/23 1417   acetaminophen  (TYLENOL ) tablet 650 mg  650 mg Oral Q6H PRN Moore, Willie, MD   650 mg at 10/24/23 1009   aspirin  EC tablet 81 mg  81 mg Oral Daily  Christobal Guadalajara, MD   81 mg at 10/25/23 1417   ceFEPIme  (MAXIPIME ) 1 g in sodium chloride  0.9 % 100 mL IVPB  1 g Intravenous Q24H Merilee Linsey I, RPH 200 mL/hr at 10/24/23 1734 1 g at 10/24/23 1734   Chlorhexidine  Gluconate Cloth 2 % PADS 6 each  6 each Topical Q0600 Jake Maisie Fellows, PA-C   6 each at 10/22/23 9387   cinacalcet  (SENSIPAR ) tablet 60 mg  60 mg Oral Q T,Th,Sat-1800 Moore, Willie, MD   60 mg at 10/24/23 1731   Darbepoetin Alfa  (ARANESP ) injection 60 mcg   60 mcg Subcutaneous Q Mon-1800 Stovall, Kathryn R, PA-C       gabapentin  (NEURONTIN ) capsule 100 mg  100 mg Oral TID Georgina Basket, MD   100 mg at 10/25/23 1417   HYDROcodone -acetaminophen  (NORCO/VICODIN) 5-325 MG per tablet 1 tablet  1 tablet Oral Q6H PRN Christobal Guadalajara, MD   1 tablet at 10/24/23 2300   [START ON 10/26/2023] influenza vac split trivalent PF (FLUZONE) injection 0.5 mL  0.5 mL Intramuscular Tomorrow-1000 Kc, Ramesh, MD       insulin  aspart (novoLOG ) injection 0-9 Units  0-9 Units Subcutaneous TID WC Georgina Basket, MD   4 Units at 10/25/23 9261   insulin  glargine (LANTUS ) injection 50 Units  50 Units Subcutaneous Daily Georgina Basket, MD   50 Units at 10/24/23 1305   metroNIDAZOLE  (FLAGYL ) tablet 500 mg  500 mg Oral Q12H Georgina Basket, MD   500 mg at 10/25/23 1417   multivitamin (RENA-VIT) tablet 1 tablet  1 tablet Oral QHS Georgina Basket, MD   1 tablet at 10/24/23 2300   mupirocin  cream (BACTROBAN ) 2 %   Topical QODAY StandifordMarsa FALCON, DPM   Given at 10/20/23 1201   ondansetron  (ZOFRAN ) injection 4 mg  4 mg Intravenous Q6H PRN Moore, Willie, MD   4 mg at 10/19/23 1640   pantoprazole  (PROTONIX ) EC tablet 40 mg  40 mg Oral Daily Georgina Basket, MD   40 mg at 10/25/23 1417   rosuvastatin  (CRESTOR ) tablet 10 mg  10 mg Oral Daily Georgina Basket, MD   10 mg at 10/25/23 1417   sevelamer  carbonate (RENVELA ) tablet 1,600 mg  1,600 mg Oral TID WC Georgina Basket, MD   1,600 mg at 10/25/23 1417   vancomycin  (VANCOCIN ) IVPB 1000 mg/200 mL premix  1,000 mg Intravenous Q M,W,F-HD Amin, Sumayya, MD 200 mL/hr at 10/25/23 1204 1,000 mg at 10/25/23 1204     Discharge Medications: Please see discharge summary for a list of discharge medications.  Relevant Imaging Results:  Relevant Lab Results:   Additional Information SSN [id number]  [place], LCSWA

## 2023-10-25 NOTE — Progress Notes (Signed)
  Subjective:  Patient ID: Edgar Brewer, male    DOB: 07-Dec-1960,  MRN: [id number]  Chief Complaint  Patient presents with   Altered Mental Status    DOS: 10/23/23 Procedure: 1.  Transmetatarsal amputation, left foot   63 y.o. male seen for post op check. He is sleeping during HD and didn't talk with him. Discussed with Dr. Christobal, plan for rehab SNF most likely  Review of Systems: Negative except as noted in the HPI. Denies N/V/F/Ch.   Objective:   Constitutional Well developed. Well nourished.  Vascular Foot warm and well perfused. Capillary refill normal to all digits.   No calf pain with palpation  Neurologic Normal speech. Oriented to person, place, and time. Epicritic sensation diminished to left foot  Dermatologic Amputation site well coapted no drainage or dehiscence, no evidence necrosis.   Orthopedic: S/p L foot TMA   Radiographs: Interval transmetatarsal amputation of the first through fifth digits.  Pathology:  A. FOOT, LEFT, TRANSMETATARSAL AMPUTATION:       Chronic osteomyelitis.       Subcutaneous tissue with congestion.       Skin ulcer and necrosis.       Bone margin is viable without evidence of acute osteomyelitis or  necrosis.   Micro: Rare GNR  Assessment:   1. Sepsis, due to unspecified organism, unspecified whether acute organ dysfunction present (HCC)   2. Hypoxia   3. 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  POD # 2 s/p TMA left foot -Progressing as expected post op, amp site healing well at this time no evidence of necrosis/ residual infection -XR: expected changes -WB Status: NWB in post op shoe LLE approx 2 weeks -Sutures: remain intact. -Medications/ABX: ok to de escalate to 5 days po abx on discharge -Dressing: Changed today, next change next week Monday at rehab or here if still admitted  - F/u Plan: Pt stable for dc from my standpoint when  dispo location determined, will have him follow up in 2 weeks office to call to arrange. Will sign off please msg with quesitons         [name], DPM [facility]

## 2023-10-25 NOTE — TOC Progression Note (Signed)
 Transition of Care [facility]) - Progression Note    Patient Details  Name: Edgar Brewer MRN: [id number] Date of Birth: [birth year]  [facility]) CM/SW Contact  Lendia Dais, [place] Phone Number: 10/25/2023, 4:17 PM  Clinical Narrative:   CSW spoke to pt and family at bedside.  CSW informed pt of PT recs of SNF. CSW asked if the pt would be agreeable to transitioning to a SNF for STR and the pt was agreeable.  CSW gave pt medicare list and sent out referrals in the HUB. Bed offers pending.  CSW will continue to monitor.                      Expected Discharge Plan and Services                                               Social Drivers of Health (SDOH) Interventions SDOH Screenings   Food Insecurity: No Food Insecurity (10/20/2023)  Housing: Low Risk  (10/20/2023)  Transportation Needs: No Transportation Needs (10/20/2023)  Utilities: Not At Risk (10/20/2023)  Depression (PHQ2-9): Low Risk  (09/19/2023)  Financial Resource Strain: Low Risk  (04/19/2022)  Physical Activity: Inactive (04/19/2022)  Social Connections: Unknown (04/19/2022)  Stress: No Stress Concern Present (04/19/2022)  Tobacco Use: Medium Risk (10/23/2023)    Readmission Risk Interventions    10/20/2023    4:09 PM  Readmission Risk Prevention Plan  Transportation Screening Complete  PCP or Specialist Appt within 3-5 Days Complete  HRI or Home Care Consult Complete  Social Work Consult for Recovery Care Planning/Counseling Complete  Palliative Care Screening Not Applicable

## 2023-10-25 NOTE — Progress Notes (Signed)
 [facility] Progress Note   Subjective:    Seen in [facility]. Getting ready to start HD No c/os appears comfortable   Objective Vitals:   10/24/23 2103 10/24/23 2104 10/25/23 0434 10/25/23 0730  BP: (!) 113/53 (!) 121/55 (!) 107/59 (!) 117/56  Pulse: 80 78 76 78  Resp: 20  20 18   Temp: 98.8 F (37.1 C)  (!) 97.5 F (36.4 C) 97.6 F (36.4 C)  TempSrc:   Axillary Oral  SpO2: 97% 98% 93% 91%  Weight:      Height:       Physical Exam General: Well appearing man, NAD. Room air. Heart: RRR Resp: Normal wob  Abdomen: soft Extremities: no RLE edema, LLE with erythema, foot bandaged s/p TMA Dialysis Access: LUE AVF +t/b    Additional Objective Labs: Basic Metabolic Panel: Recent Labs  Lab 10/23/23 0915 10/23/23 1538 10/24/23 0735 10/25/23 0359  NA 133* 133* 132* 135  K 5.2* 4.1 5.2* 5.5*  CL 92* 95* 92* 94*  CO2 21*  --  24 23  GLUCOSE 84 86 238* 267*  BUN 104* 42* 68* 85*  CREATININE 15.16* 8.60* 10.43* 12.52*  CALCIUM  9.5  --  9.8 9.3  PHOS  --   --  7.5*  --    Liver Function Tests: Recent Labs  Lab 10/19/23 0900 10/24/23 0735  AST 19 22  ALT 19 21  ALKPHOS 255* 350*  BILITOT 1.1 0.8  PROT 7.1 7.1  ALBUMIN 3.0* 2.7*   Recent Labs  Lab 10/19/23 0900  LIPASE 14   CBC: Recent Labs  Lab 10/19/23 0900 10/19/23 1449 10/23/23 0915 10/23/23 1538 10/24/23 0735 10/25/23 0359  WBC 8.6 7.9 9.1  --  12.1* 9.4  NEUTROABS 7.5  --   --   --   --   --   HGB 11.1* 10.4* 9.0* 10.2* 10.7* 9.5*  HCT 34.2* 32.7* 28.0* 30.0* 32.8* 29.0*  MCV 97.7 98.8 95.6  --  94.5 95.1  PLT 208 199 231  --  276 261   Blood Culture    Component Value Date/Time   SDES TISSUE LEFT FOOT 10/23/2023 1640   SPECREQUEST NONE 10/23/2023 1640   CULT  10/23/2023 1640    TOO YOUNG TO READ Performed at [facility], [address], [place], [place] [address]    REPTSTATUS PENDING 10/23/2023 1640   Studies/Results: DG Foot 2 Views Left Result Date:  10/23/2023 CLINICAL DATA:  Postop EXAM: LEFT FOOT - 2 VIEW COMPARISON:  10/19/2023 FINDINGS: Interval transmetatarsal amputation of the first through fifth digits. Cutaneous staples. Vascular calcifications. IMPRESSION: Interval transmetatarsal amputation of the first through fifth digits. Electronically Signed   By: Luke Bun M.D.   On: 10/23/2023 19:52   Medications:  ceFEPime  (MAXIPIME ) IV 1 g (10/24/23 1734)   vancomycin       (feeding supplement) PROSource Plus  30 mL Oral BID BM   Chlorhexidine  Gluconate Cloth  6 each Topical Q0600   cinacalcet   60 mg Oral Q T,Th,Sat-1800   darbepoetin (ARANESP ) injection - DIALYSIS  60 mcg Subcutaneous Q Mon-1800   gabapentin   100 mg Oral TID   [START ON 10/26/2023] influenza vac split trivalent PF  0.5 mL Intramuscular Tomorrow-1000   insulin  aspart  0-9 Units Subcutaneous TID WC   insulin  glargine  50 Units Subcutaneous Daily   metroNIDAZOLE   500 mg Oral Q12H   multivitamin  1 tablet Oral QHS   mupirocin  cream   Topical QODAY   pantoprazole   40 mg Oral Daily   rosuvastatin   10 mg Oral Daily   sevelamer  carbonate  1,600 mg Oral TID WC    Dialysis Orders MWF - NW 4hr, 400/A1.5, EDW 96.8kg, 2K/2.5Ca bath, AVF, Heparin  2600 - no ESA, Hgb > goal - Calcitriol 1.75 TIW   Assessment/Plan: Sepsis/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 On IV Vanc/Cefepime . Blood cultures NGTD. 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  ESRD.  Continue usual MWF HD - for HD today  Hypertension/volume. BP controlled, min LE edema, but low Na - inching EDW down. Anemia. Hgb variable - monitor without ESA for now. Metabolic bone disease.  CorrCa high sided, VDRA on hold. Phos above goal. Continue home binders/cinacalcet . Nutrition: Alb low, continue supplements. T2DM: Insulin  per primary. DVT prophylaxis: Prefer heparin  over Lovenox  in ESRD patient, AC on hold s/p surgery. Dispo: SNF recommended   [name] PA-C [facility] 10/25/2023,8:59  AM

## 2023-10-26 ENCOUNTER — Ambulatory Visit (HOSPITAL_COMMUNITY): Admit: 2023-10-26 | Payer: Medicare (Managed Care) | Admitting: Podiatry

## 2023-10-26 DIAGNOSIS — N186 End stage renal disease: Secondary | ICD-10-CM | POA: Diagnosis not present

## 2023-10-26 DIAGNOSIS — E118 Type 2 diabetes mellitus with unspecified complications: Secondary | ICD-10-CM | POA: Diagnosis not present

## 2023-10-26 DIAGNOSIS — M86172 Other acute osteomyelitis, left ankle and foot: Secondary | ICD-10-CM | POA: Diagnosis not present

## 2023-10-26 DIAGNOSIS — A419 Sepsis, unspecified organism: Secondary | ICD-10-CM | POA: Diagnosis not present

## 2023-10-26 LAB — CBC
HCT: 30.4 % — ABNORMAL LOW (ref 39.0–52.0)
Hemoglobin: 9.9 g/dL — ABNORMAL LOW (ref 13.0–17.0)
MCH: 30.9 pg (ref 26.0–34.0)
MCHC: 32.6 g/dL (ref 30.0–36.0)
MCV: 95 fL (ref 80.0–100.0)
Platelets: 272 K/uL (ref 150–400)
RBC: 3.2 MIL/uL — ABNORMAL LOW (ref 4.22–5.81)
RDW: 13.7 % (ref 11.5–15.5)
WBC: 9.9 K/uL (ref 4.0–10.5)
nRBC: 0 % (ref 0.0–0.2)

## 2023-10-26 LAB — GLUCOSE, CAPILLARY
Glucose-Capillary: 104 mg/dL — ABNORMAL HIGH (ref 70–99)
Glucose-Capillary: 321 mg/dL — ABNORMAL HIGH (ref 70–99)
Glucose-Capillary: 267 mg/dL — ABNORMAL HIGH (ref 70–99)
Glucose-Capillary: 78 mg/dL (ref 70–99)

## 2023-10-26 LAB — BASIC METABOLIC PANEL WITH GFR
Anion gap: 13 (ref 5–15)
BUN: 50 mg/dL — ABNORMAL HIGH (ref 8–23)
CO2: 26 mmol/L (ref 22–32)
Calcium: 9.2 mg/dL (ref 8.9–10.3)
Chloride: 93 mmol/L — ABNORMAL LOW (ref 98–111)
Creatinine, Ser: 9 mg/dL — ABNORMAL HIGH (ref 0.61–1.24)
GFR, Estimated: 6 mL/min — ABNORMAL LOW (ref >60)
Glucose, Bld: 213 mg/dL — ABNORMAL HIGH (ref 70–99)
Potassium: 4.7 mmol/L (ref 3.5–5.1)
Sodium: 132 mmol/L — ABNORMAL LOW (ref 135–145)

## 2023-10-26 SURGERY — EXCISION, EXOSTOSIS, TOE
Anesthesia: Monitor Anesthesia Care | Laterality: Left

## 2023-10-26 MED ORDER — AMOXICILLIN-POT CLAVULANATE 500-125 MG PO TABS
1.0000 | ORAL_TABLET | Freq: Two times a day (BID) | ORAL | Status: DC
  Administered 2023-10-27: 1 via ORAL
  Filled 2023-10-26: qty 1

## 2023-10-26 MED ORDER — HEPARIN SODIUM (PORCINE) 5000 UNIT/ML IJ SOLN
5000.0000 [IU] | Freq: Three times a day (TID) | INTRAMUSCULAR | Status: DC
  Administered 2023-10-26 – 2023-11-07 (×35): 5000 [IU] via SUBCUTANEOUS
  Filled 2023-10-26 (×37): qty 1

## 2023-10-26 MED ORDER — INSULIN ASPART 100 UNIT/ML IJ SOLN
4.0000 [IU] | Freq: Three times a day (TID) | INTRAMUSCULAR | Status: DC
  Administered 2023-10-26 – 2023-11-07 (×28): 4 [IU] via SUBCUTANEOUS

## 2023-10-26 MED ORDER — AMOXICILLIN-POT CLAVULANATE 500-125 MG PO TABS: 1.0000 | ORAL_TABLET | Freq: Two times a day (BID) | ORAL | Status: DC

## 2023-10-26 MED ORDER — HYDROCODONE-ACETAMINOPHEN 5-325 MG PO TABS
0.5000 | ORAL_TABLET | Freq: Once | ORAL | Status: AC
  Administered 2023-10-26: 0.5 via ORAL
  Filled 2023-10-26: qty 1

## 2023-10-26 NOTE — Progress Notes (Signed)
 [facility] Progress Note   Subjective:    Completed dialysis Wed - 2.3L UF Seen in room. Mother at bedside.  No complaints. SNF placement pending    Objective Vitals:   10/25/23 1748 10/25/23 2057 10/26/23 0409 10/26/23 0750  BP: 122/60 (!) 130/53 (!) 116/56 (!) 118/55  Pulse: 94 89 84 82  Resp: 18 20 18 18   Temp: 98 F (36.7 C) 98.4 F (36.9 C) 98.2 F (36.8 C) 98.4 F (36.9 C)  TempSrc: Oral Oral Oral   SpO2: 94% 93% 95% 93%  Weight:      Height:       Physical Exam General: Well appearing man, NAD. Room air. Heart: RRR Resp: Normal wob  Abdomen: soft Extremities: no RLE edema, LLE with erythema, foot bandaged s/p TMA Dialysis Access: LUE AVF +t/b    Additional Objective Labs: Basic Metabolic Panel: Recent Labs  Lab 10/24/23 0735 10/25/23 0359 10/25/23 0917 10/26/23 0206  NA 132* 135 134* 132*  K 5.2* 5.5* 5.5* 4.7  CL 92* 94* 95* 93*  CO2 24 23 22 26   GLUCOSE 238* 267* 231* 213*  BUN 68* 85* 89* 50*  CREATININE 10.43* 12.52* 12.94* 9.00*  CALCIUM  9.8 9.3 9.1 9.2  PHOS 7.5*  --  8.6*  --    Liver Function Tests: Recent Labs  Lab 10/24/23 0735 10/25/23 0917  AST 22  --   ALT 21  --   ALKPHOS 350*  --   BILITOT 0.8  --   PROT 7.1  --   ALBUMIN 2.7* 2.4*   No results for input(s): LIPASE, AMYLASE in the last 168 hours.  CBC: Recent Labs  Lab 10/23/23 0915 10/23/23 1538 10/24/23 0735 10/25/23 0359 10/25/23 0917 10/26/23 0206  WBC 9.1  --  12.1* 9.4 8.6 9.9  HGB 9.0*   < > 10.7* 9.5* 9.3* 9.9*  HCT 28.0*   < > 32.8* 29.0* 28.3* 30.4*  MCV 95.6  --  94.5 95.1 96.6 95.0  PLT 231  --  276 261 272 272   < > = values in this interval not displayed.   Blood Culture    Component Value Date/Time   SDES TISSUE LEFT FOOT 10/23/2023 1640   SPECREQUEST NONE 10/23/2023 1640   CULT  10/23/2023 1640    MODERATE GRAM NEGATIVE RODS FEW STAPHYLOCOCCUS AUREUS IDENTIFICATION AND SUSCEPTIBILITIES TO FOLLOW NO ANAEROBES  ISOLATED; CULTURE IN PROGRESS FOR 5 DAYS    REPTSTATUS PENDING 10/23/2023 1640   Studies/Results: No results found.  Medications:  ceFEPime  (MAXIPIME ) IV 1 g (10/25/23 1738)   vancomycin  1,000 mg (10/25/23 1204)    (feeding supplement) PROSource Plus  30 mL Oral BID BM   aspirin  EC  81 mg Oral Daily   Chlorhexidine  Gluconate Cloth  6 each Topical Q0600   cinacalcet   60 mg Oral Q T,Th,Sat-1800   darbepoetin (ARANESP ) injection - DIALYSIS  60 mcg Subcutaneous Q Mon-1800   gabapentin   100 mg Oral TID   heparin  injection (subcutaneous)  5,000 Units Subcutaneous Q8H   insulin  aspart  0-9 Units Subcutaneous TID WC   insulin  glargine  50 Units Subcutaneous Daily   metroNIDAZOLE   500 mg Oral Q12H   multivitamin  1 tablet Oral QHS   mupirocin  cream   Topical QODAY   pantoprazole   40 mg Oral Daily   rosuvastatin   10 mg Oral Daily   sevelamer  carbonate  1,600 mg Oral TID WC    Dialysis Orders MWF - NW 4hr, 400/A1.5, EDW 96.8kg,  2K/2.5Ca bath, AVF, Heparin  2600 - no ESA, Hgb > goal - Calcitriol 1.75 TIW   Assessment/Plan: Sepsis/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 On IV Vanc/Cefepime . Blood cultures NGTD. 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  ESRD.  Continue usual MWF HD - HD 10/3 Hypertension/volume. BP controlled, min LE edema, but low Na - inching EDW down. Anemia. Hgb variable - monitor without ESA for now. Metabolic bone disease.  CorrCa high sided, VDRA on hold. Phos above goal. Continue home binders/cinacalcet . Nutrition: Alb low, continue supplements. T2DM: Insulin  per primary. DVT prophylaxis: Prefer heparin  over Lovenox  in ESRD patient, AC on hold s/p surgery. Dispo: SNF recommended   [name] PA-C [facility] 10/26/2023,11:18 AM

## 2023-10-26 NOTE — TOC Progression Note (Signed)
 Transition of Care [facility]) - Progression Note    Patient Details  Name: Edgar Brewer MRN: [id number] Date of Birth: [birth year]  Transition of Care [facility]) CM/SW Contact  Lendia Dais, [place] Phone Number: 10/26/2023, 10:48 AM  Clinical Narrative:  CSW spoke to pt and pt's mother [name] at bedside.   Pt gave verbal permission for [name] to chose a SNF for the pt. [name] has a preference of [facility], [place], and [facility]. CSW has reached out to the following facilities and pending a decision.  CSW will continue to monitor.                      Expected Discharge Plan and Services                                               Social Drivers of Health (SDOH) Interventions SDOH Screenings   Food Insecurity: No Food Insecurity (10/20/2023)  Housing: Low Risk  (10/20/2023)  Transportation Needs: No Transportation Needs (10/20/2023)  Utilities: Not At Risk (10/20/2023)  Depression (PHQ2-9): Low Risk  (09/19/2023)  Financial Resource Strain: Low Risk  (04/19/2022)  Physical Activity: Inactive (04/19/2022)  Social Connections: Unknown (04/19/2022)  Stress: No Stress Concern Present (04/19/2022)  Tobacco Use: Medium Risk (10/23/2023)    Readmission Risk Interventions    10/20/2023    4:09 PM  Readmission Risk Prevention Plan  Transportation Screening Complete  PCP or Specialist Appt within 3-5 Days Complete  HRI or Home Care Consult Complete  Social Work Consult for Recovery Care Planning/Counseling Complete  Palliative Care Screening Not Applicable

## 2023-10-26 NOTE — Inpatient Diabetes Management (Signed)
 Inpatient Diabetes Program Recommendations  AACE/ADA: New Consensus Statement on Inpatient Glycemic Control (2015)  Target Ranges:  Prepandial:   less than 140 mg/dL      Peak postprandial:   less than 180 mg/dL (1-2 hours)      Critically ill patients:  140 - 180 mg/dL   Lab Results  Component Value Date   GLUCAP 321 (H) 10/26/2023   HGBA1C 6.3 (H) 10/19/2023    Review of Glycemic Control  Latest Reference Range & Units 10/25/23 07:29 10/25/23 14:07 10/25/23 17:47 10/25/23 21:00 10/26/23 07:47 10/26/23 11:22  Glucose-Capillary 70 - 99 mg/dL 758 (H) 869 (H) 697 (H) 336 (H) 104 (H) 321 (H)  (H): Data is abnormally high  Diabetes history: DM2 Outpatient Diabetes medications: Lantus  50 units QD Current orders for Inpatient glycemic control: Lantus  50 units every day, Novolog  0-9 units TID  Inpatient Diabetes Program Recommendations:    Postprandials elevated.  Might consider:  Novolog  4 units TID with meals if she consumes at least 50%  Thank you, Wyvonna Pinal, MSN, CDCES Diabetes Coordinator Inpatient Diabetes Program [phone] (team pager from 8a-5p)

## 2023-10-26 NOTE — Progress Notes (Signed)
 Progress Note   Patient: Edgar Brewer FMW:[id number] DOB: Aug 15, 1960 DOA: 10/19/2023     7 DOS: the patient was seen and examined on 10/26/2023   Brief hospital course: Tiernan Suto is a 63 y.o. male with PMH of 63 y.o. 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  Patient had amputation 2 weeks ago, noticed purulent discharge and was given 1 week of antibiotics as outpatient.  Patient was having chills so brought to ED On presentation febrile at 100.8, tachycardic and tachypneic, hypoxic requiring 2 L of oxygen.  Labs with potassium of 5.2, creatinine 8.73, lactic acid 5.6> 0.8,US  LLE W/O DVT. XR L foot w/o acute findings or OM.  Blood cultures were drawn. Patient was started on vancomycin , cefepime  and Flagyl .  Podiatry was consulted. Seen by podiatry,case was discussed with vascular surgery MRI >> postsurgical changes and incomplete wound healing dorsally, no organized fluid collection, there was nonspecific T2 hyperintensity within the remaining second proximal phalangeal base and second metatarsal head which can be either postsurgical or secondary to early osteomyelitis.  No other specific evidence of osteomyelitis noted. 9/29: Remained hemodynamically stable, patient had transmetatarsal amputation left foot 9/29   Assessment and plan: Sepsis 2/2 surgical wound infection left foot S/p recent amputation of L 2nd toe through proximal phalanx. Podiatry was consulted-MRI for with postsurgical changes and incomplete wound healing dorsally, no organized fluid collection, there was nonspecific T2 hyperintensity within the remaining second proximal phalangeal base and second metatarsal head which can be either postsurgical or secondary to early osteomyelitis.  No other specific evidence of osteomyelitis  noted. S/ p Transmetatarsal amputation left foot 9/29 -OR culture Gram stain gram-negative rod, culture is still in process Continue with vancomycin  and cefepime  pending further culture-plan for antibiotic x 5 days. Changed Cefepime , flagyl  to Augmentin  for 5 days. Discussed with podiatry-PT has evaluated, planning for SNF, continue NWB to LLE   Diabetes mellitus type 2 with complications: Poorly controlled hyperglycemia, continue with Lantus  and SSI-and adjust.    ESRD on dialysis MWF Metabolic bone disease Mild hyperkalemia: Nephro following for HD needs.  Continue Renvela  Sensipar .  Continue dialysis today   Neuropathy: Continue with home gabapentin    PAD Patient with history of significant PAD, will be high risk in terms of wound healing or progressive amputation. Continue with home Crestor   Class I Obesity w/ Body mass index is 32.85 kg/m.: Diet, weight reduction advised.   Out of bed to chair. Incentive spirometry. Nursing supportive care. Fall, aspiration precautions. Diet:  Diet Orders (From admission, onward)     Start     Ordered   10/23/23 1711  Diet Carb Modified Fluid consistency: Thin; Room service appropriate? Yes  Diet effective now       Question Answer Comment  Diet-HS Snack? Nothing   Calorie Level Medium 1600-2000   Fluid consistency: Thin   Room service appropriate? Yes      10/23/23 1711           DVT prophylaxis: heparin  injection 5,000 Units Start: 10/26/23 0915  Level of care: Med-Surg   Code Status: Full Code  Subjective: Patient is seen and examined today morning. He is sitting on edge of the bed. Wife at bedside. Denies pain.   Physical Exam: Vitals:   10/26/23 0409 10/26/23 0750 10/26/23 1732 10/26/23 1943  BP: (!) 116/56 (!) 118/55 (!) 107/51 (!) 122/51  Pulse: 84 82 81 84  Resp: 18 18 18 18   Temp: 98.2 F (36.8 C) 98.4 F (36.9 C) 98.2 F (36.8 C) 98.8 F (37.1 C)  TempSrc: Oral   Oral  SpO2: 95% 93% 94% 93%  Weight:       Height:        General - Elderly Caucasian male, no apparent distress HEENT - PERRLA, EOMI, atraumatic head, non tender sinuses. Lung - Clear, basal rales, no rhonchi, wheezes. Heart - S1, S2 heard, no murmurs, rubs, no pedal edema. Abdomen - Soft, non tender, bowel sounds good Neuro - Alert, awake and oriented x 3, non focal exam. Skin - Warm and dry. Left foot dressing.  Data Reviewed:      Latest Ref Rng & Units 10/26/2023    2:06 AM 10/25/2023    9:17 AM 10/25/2023    3:59 AM  CBC  WBC 4.0 - 10.5 K/uL 9.9  8.6  9.4   Hemoglobin 13.0 - 17.0 g/dL 9.9  9.3  9.5   Hematocrit 39.0 - 52.0 % 30.4  28.3  29.0   Platelets 150 - 400 K/uL 272  272  261       Latest Ref Rng & Units 10/26/2023    2:06 AM 10/25/2023    9:17 AM 10/25/2023    3:59 AM  BMP  Glucose 70 - 99 mg/dL 786  768  732   BUN 8 - 23 mg/dL 50  89  85   Creatinine 0.61 - 1.24 mg/dL 0.99  87.05  87.47   Sodium 135 - 145 mmol/L 132  134  135   Potassium 3.5 - 5.1 mmol/L 4.7  5.5  5.5   Chloride 98 - 111 mmol/L 93  95  94   CO2 22 - 32 mmol/L 26  22  23    Calcium  8.9 - 10.3 mg/dL 9.2  9.1  9.3    No results found.  Family Communication: Discussed with patient, understand and agree. All questions answered.  Disposition: Status is: Inpatient Remains inpatient appropriate because: placement.  Planned Discharge Destination: Skilled nursing facility     Time spent: 44 minutes  Author: Concepcion Riser, MD 10/26/2023 9:05 PM Secure chat 7am to 7pm For on call review [email or web address].

## 2023-10-27 DIAGNOSIS — A419 Sepsis, unspecified organism: Secondary | ICD-10-CM | POA: Diagnosis not present

## 2023-10-27 LAB — CBC
HCT: 30.9 % — ABNORMAL LOW (ref 39.0–52.0)
Hemoglobin: 10.2 g/dL — ABNORMAL LOW (ref 13.0–17.0)
MCH: 31 pg (ref 26.0–34.0)
MCHC: 33 g/dL (ref 30.0–36.0)
MCV: 93.9 fL (ref 80.0–100.0)
Platelets: 289 K/uL (ref 150–400)
RBC: 3.29 MIL/uL — ABNORMAL LOW (ref 4.22–5.81)
RDW: 13.5 % (ref 11.5–15.5)
WBC: 10.6 K/uL — ABNORMAL HIGH (ref 4.0–10.5)
nRBC: 0 % (ref 0.0–0.2)

## 2023-10-27 LAB — GLUCOSE, CAPILLARY
Glucose-Capillary: 80 mg/dL (ref 70–99)
Glucose-Capillary: 155 mg/dL — ABNORMAL HIGH (ref 70–99)
Glucose-Capillary: 88 mg/dL (ref 70–99)

## 2023-10-27 LAB — BASIC METABOLIC PANEL WITH GFR
Anion gap: 17 — ABNORMAL HIGH (ref 5–15)
BUN: 71 mg/dL — ABNORMAL HIGH (ref 8–23)
CO2: 25 mmol/L (ref 22–32)
Calcium: 9.5 mg/dL (ref 8.9–10.3)
Chloride: 91 mmol/L — ABNORMAL LOW (ref 98–111)
Creatinine, Ser: 11.18 mg/dL — ABNORMAL HIGH (ref 0.61–1.24)
GFR, Estimated: 5 mL/min — ABNORMAL LOW (ref >60)
Glucose, Bld: 75 mg/dL (ref 70–99)
Potassium: 5.1 mmol/L (ref 3.5–5.1)
Sodium: 133 mmol/L — ABNORMAL LOW (ref 135–145)

## 2023-10-27 MED ORDER — AMOXICILLIN-POT CLAVULANATE 500-125 MG PO TABS
1.0000 | ORAL_TABLET | Freq: Every day | ORAL | Status: AC
Start: 2023-10-27 — End: 2023-11-01
  Administered 2023-10-27 – 2023-10-31 (×5): 1 via ORAL
  Filled 2023-10-27 (×6): qty 1

## 2023-10-27 MED ORDER — PENTAFLUOROPROP-TETRAFLUOROETH EX AERO: 1.0000 | INHALATION_SPRAY | CUTANEOUS | Status: DC | PRN

## 2023-10-27 MED ORDER — DARBEPOETIN ALFA 60 MCG/0.3ML IJ SOSY
60.0000 ug | PREFILLED_SYRINGE | INTRAMUSCULAR | Status: DC
  Administered 2023-10-27: 60 ug via SUBCUTANEOUS
  Filled 2023-10-27: qty 0.3

## 2023-10-27 MED ORDER — LIDOCAINE HCL (PF) 1 % IJ SOLN: 5.0000 mL | INTRAMUSCULAR | Status: DC | PRN

## 2023-10-27 MED ORDER — ALTEPLASE 2 MG IJ SOLR: 2.0000 mg | Freq: Once | INTRAMUSCULAR | Status: DC | PRN

## 2023-10-27 MED ORDER — ANTICOAGULANT SODIUM CITRATE 4% (200MG/5ML) IV SOLN: 5.0000 mL | Status: DC | PRN

## 2023-10-27 MED ORDER — SENNOSIDES-DOCUSATE SODIUM 8.6-50 MG PO TABS
1.0000 | ORAL_TABLET | Freq: Two times a day (BID) | ORAL | Status: DC
  Administered 2023-10-27 – 2023-11-07 (×19): 1 via ORAL
  Filled 2023-10-27 (×22): qty 1

## 2023-10-27 MED ORDER — HEPARIN SODIUM (PORCINE) 1000 UNIT/ML DIALYSIS: 1000.0000 [IU] | INTRAMUSCULAR | Status: DC | PRN

## 2023-10-27 MED ORDER — LIDOCAINE-PRILOCAINE 2.5-2.5 % EX CREA: 1.0000 | TOPICAL_CREAM | CUTANEOUS | Status: DC | PRN

## 2023-10-27 MED ORDER — POLYETHYLENE GLYCOL 3350 17 G PO PACK
17.0000 g | PACK | Freq: Every day | ORAL | Status: DC
  Administered 2023-10-27 – 2023-11-06 (×8): 17 g via ORAL
  Filled 2023-10-27 (×11): qty 1

## 2023-10-27 MED ORDER — HEPARIN SODIUM (PORCINE) 1000 UNIT/ML DIALYSIS
20.0000 [IU]/kg | INTRAMUSCULAR | Status: DC | PRN
  Administered 2023-10-27: 1900 [IU] via INTRAVENOUS_CENTRAL

## 2023-10-27 MED ORDER — INSULIN GLARGINE 100 UNIT/ML ~~LOC~~ SOLN
50.0000 [IU] | Freq: Every day | SUBCUTANEOUS | Status: DC
  Administered 2023-10-28 – 2023-11-07 (×9): 50 [IU] via SUBCUTANEOUS
  Filled 2023-10-27 (×11): qty 0.5

## 2023-10-27 NOTE — Progress Notes (Addendum)
   Inpatient Rehab Admissions Coordinator :  Per therapy recommendations, patient was screened for CIR candidacy by Heron Leavell RN MSN.  [facility] is not in network with [employer] Medicare advantage. Other in network rehab venues should be pursued. TOC SW made aware at 1812.  Heron Leavell, RN, MSN Rehab Admissions Coordinator [phone] 10/27/2023 12:41 PM

## 2023-10-27 NOTE — TOC Progression Note (Signed)
 Transition of Care [facility]) - Progression Note    Patient Details  Name: Edgar Brewer MRN: [id number] Date of Birth: [birth year]  Transition of Care [facility]) CM/SW Contact  Lendia Dais, [place] Phone Number: 10/27/2023, 4:22 PM  Clinical Narrative:   CSW spoke to the pt and pt's mother [name] at bedside.  [name] was agreeable to CIR, CSW stated if the pt is not eligible for CIR, there are still bed offers in the [place] area to choose.  Pt and Hilda need a f/u to choose a bed. Preferences of facilities of [facility], [facility], and [facility] have declined.                       Expected Discharge Plan and Services                                               Social Drivers of Health (SDOH) Interventions SDOH Screenings   Food Insecurity: No Food Insecurity (10/20/2023)  Housing: Low Risk  (10/20/2023)  Transportation Needs: No Transportation Needs (10/20/2023)  Utilities: Not At Risk (10/20/2023)  Depression (PHQ2-9): Low Risk  (09/19/2023)  Financial Resource Strain: Low Risk  (04/19/2022)  Physical Activity: Inactive (04/19/2022)  Social Connections: Unknown (04/19/2022)  Stress: No Stress Concern Present (04/19/2022)  Tobacco Use: Medium Risk (10/23/2023)    Readmission Risk Interventions    10/20/2023    4:09 PM  Readmission Risk Prevention Plan  Transportation Screening Complete  PCP or Specialist Appt within 3-5 Days Complete  HRI or Home Care Consult Complete  Social Work Consult for Recovery Care Planning/Counseling Complete  Palliative Care Screening Not Applicable

## 2023-10-27 NOTE — Progress Notes (Signed)
 Physical Therapy Treatment Patient Details Name: Idris Edmundson MRN: [id number] DOB: Nov 30, 1960 Today's Date: 10/27/2023   History of Present Illness Taiten Brawn is a 63 y.o. male and recent admission for amputation of L 2nd toe through proximal phalanx p/w  sepsis surgical wound infection, s/p L transmetatarsal amputation on 9/29, NWB; with medical history significant of ESRD on HD MWF, HFpEF (EF 60-65% in 08/2020) , DM2, prior CVA w/o residual deficits    PT Comments  Progressing towards goals, able to tolerate pre-gait training to ensure better compliance with WB precautions (NWB) for LLE while wearing post-op shoe. Transitioned with gait in forward direction up to 5 feet with min assist for balance and walker control. Demonstrates asterixis like jerking in UEs and LE, preventing further ambulatory distance today without +2 for safety/chair follow. Doing a better job of NWB compliance with verbal cues primarily. Min assist to stand several times from EOB with good power up through RLE but needs cues for hand placement and set-up; primarily assisted with balance upon standing but eventually steadies himself with CGA in static position. Spoke with family and they are happy to provide additional support when pt gets home. Updating recs as I think he has good potential with aggressive post-acute rehab. Patient will benefit from intensive inpatient follow-up therapy, >3 hours/day. Patient will continue to benefit from skilled physical therapy services to further improve independence with functional mobility.    If plan is discharge home, recommend the following: A lot of help with walking and/or transfers;Help with stairs or ramp for entrance;A lot of help with bathing/dressing/bathroom;Assist for transportation;Assistance with cooking/housework   Can travel by private vehicle     No  Equipment Recommendations  Wheelchair (measurements PT);Wheelchair cushion (measurements PT);Rolling walker (2 wheels)     Recommendations for Other Services Rehab consult     Precautions / Restrictions Precautions Precautions: Fall Recall of Precautions/Restrictions: Impaired Required Braces or Orthoses: Other Brace Other Brace: postop shoe Restrictions Weight Bearing Restrictions Per Provider Order: Yes LLE Weight Bearing Per Provider Order: Non weight bearing     Mobility  Bed Mobility Overal bed mobility: Needs Assistance Bed Mobility: Supine to Sit     Supine to sit: Supervision     General bed mobility comments: Extra time, no physical assist.    Transfers Overall transfer level: Needs assistance Equipment used: Rolling walker (2 wheels) Transfers: Sit to/from Stand, Bed to chair/wheelchair/BSC Sit to Stand: Min assist           General transfer comment: Min assist for boost and balance. Good power-up with Rt leg, requires multiple cues for hand placement and set-up prior to rising.Practiced several times with fair carry over. Still needs cues and initially some support to maintain NWB on LLE.    Ambulation/Gait Ambulation/Gait assistance: Min assist Gait Distance (Feet): 5 Feet Assistive device: Rolling walker (2 wheels) Gait Pattern/deviations:  (hop) Gait velocity: dec Gait velocity interpretation: <1.31 ft/sec, indicative of household ambulator Pre-gait activities: Weight shift, Locking elbows to unload RLE while NWB on LLE (challenging for pt  to perform consistently.) General Gait Details: Demonstrated to pt prior to attempting. Pre-gait activity to ensure NWB on LLE. Adjusted RW for maximum support and leverage. Requires min assist for balance and RW control. Some asterixis type jerking with UEs and RLE. With cues able to maintain NWB on LLE (op shoe on.) Concern for buckling deferred further gait progression until +2 assist available. Pivots on RLE with min assist for balance.   Stairs  Wheelchair Mobility     Tilt Bed    Modified Rankin  (Stroke Patients Only)       Balance Overall balance assessment: Needs assistance Sitting-balance support: No upper extremity supported, Feet supported Sitting balance-Leahy Scale: Fair     Standing balance support: Bilateral upper extremity supported, Reliant on assistive device for balance Standing balance-Leahy Scale: Poor                              Communication Communication Communication: No apparent difficulties  Cognition Arousal: Alert Behavior During Therapy: WFL for tasks assessed/performed   PT - Cognitive impairments: Orientation, Attention, Sequencing, Problem solving                       PT - Cognition Comments: Delayed responses, one month off, could not remember name of hospital. Oriented to self, situation, city and year. Following commands: Intact      Cueing Cueing Techniques: Verbal cues, Gestural cues  Exercises General Exercises - Lower Extremity Ankle Circles/Pumps: AROM, Both, 10 reps, Seated Quad Sets: Strengthening, Both, 10 reps, Seated    General Comments General comments (skin integrity, edema, etc.): Spoke with mother and sister regarding recs.      Pertinent Vitals/Pain Pain Assessment Pain Assessment: Faces Faces Pain Scale: Hurts a little bit Pain Location: L foot Pain Descriptors / Indicators: Throbbing Pain Intervention(s): Monitored during session, Limited activity within patient's tolerance, Repositioned    Home Living                          Prior Function            PT Goals (current goals can now be found in the care plan section) Acute Rehab PT Goals Patient Stated Goal: To get better and be able to bear weight on L foot PT Goal Formulation: With patient/family Time For Goal Achievement: 11/07/23 Potential to Achieve Goals: Good Progress towards PT goals: Progressing toward goals    Frequency    Min 2X/week      PT Plan      Co-evaluation              AM-PAC PT  6 Clicks Mobility   Outcome Measure  Help needed turning from your back to your side while in a flat bed without using bedrails?: None Help needed moving from lying on your back to sitting on the side of a flat bed without using bedrails?: None Help needed moving to and from a bed to a chair (including a wheelchair)?: A Little Help needed standing up from a chair using your arms (e.g., wheelchair or bedside chair)?: A Little Help needed to walk in hospital room?: A Lot Help needed climbing 3-5 steps with a railing? : Total 6 Click Score: 17    End of Session Equipment Utilized During Treatment: Gait belt Activity Tolerance: Patient tolerated treatment well Patient left: in chair;with call bell/phone within reach;with chair alarm set;with family/visitor present Nurse Communication: Mobility status PT Visit Diagnosis: Unsteadiness on feet (R26.81);Other abnormalities of gait and mobility (R26.89);Muscle weakness (generalized) (M62.81);Pain Pain - Right/Left: Left Pain - part of body: Ankle and joints of foot     Time: 1047-1110 PT Time Calculation (min) (ACUTE ONLY): 23 min  Charges:    $Gait Training: 8-22 mins $Therapeutic Activity: 8-22 mins PT General Charges $$ ACUTE PT VISIT: 1 Visit  Leontine Roads, PT, DPT [facility]  Rehabilitation Services Physical Therapist Office: [phone] Website: [email or web address]    Leontine GORMAN Roads 10/27/2023, 12:24 PM

## 2023-10-27 NOTE — Progress Notes (Signed)
 [facility] Progress Note   Subjective:    Seen in room. Mother at bedside No new complaints. No cp, sob  Dialysis today   Objective Vitals:   10/26/23 1732 10/26/23 1943 10/27/23 0413 10/27/23 0759  BP: (!) 107/51 (!) 122/51 (!) 114/55 126/60  Pulse: 81 84 81 86  Resp: 18 18 18 19   Temp: 98.2 F (36.8 C) 98.8 F (37.1 C) 97.7 F (36.5 C) 98.3 F (36.8 C)  TempSrc:  Oral Axillary   SpO2: 94% 93% 94% 94%  Weight:      Height:       Physical Exam General: Well appearing man, NAD. Room air. Heart: RRR Resp: Normal wob  Abdomen: soft Extremities: no RLE edema, LLE with erythema, foot bandaged s/p TMA Dialysis Access: LUE AVF +t/b    Additional Objective Labs: Basic Metabolic Panel: Recent Labs  Lab 10/24/23 0735 10/25/23 0359 10/25/23 0917 10/26/23 0206 10/27/23 0324  NA 132*   < > 134* 132* 133*  K 5.2*   < > 5.5* 4.7 5.1  CL 92*   < > 95* 93* 91*  CO2 24   < > 22 26 25   GLUCOSE 238*   < > 231* 213* 75  BUN 68*   < > 89* 50* 71*  CREATININE 10.43*   < > 12.94* 9.00* 11.18*  CALCIUM  9.8   < > 9.1 9.2 9.5  PHOS 7.5*  --  8.6*  --   --    < > = values in this interval not displayed.   Liver Function Tests: Recent Labs  Lab 10/24/23 0735 10/25/23 0917  AST 22  --   ALT 21  --   ALKPHOS 350*  --   BILITOT 0.8  --   PROT 7.1  --   ALBUMIN 2.7* 2.4*   No results for input(s): LIPASE, AMYLASE in the last 168 hours.  CBC: Recent Labs  Lab 10/24/23 0735 10/25/23 0359 10/25/23 0917 10/26/23 0206 10/27/23 0324  WBC 12.1* 9.4 8.6 9.9 10.6*  HGB 10.7* 9.5* 9.3* 9.9* 10.2*  HCT 32.8* 29.0* 28.3* 30.4* 30.9*  MCV 94.5 95.1 96.6 95.0 93.9  PLT 276 261 272 272 289   Blood Culture    Component Value Date/Time   SDES TISSUE LEFT FOOT 10/23/2023 1640   SPECREQUEST NONE 10/23/2023 1640   CULT  10/23/2023 1640    MODERATE ENTEROCOCCUS FAECALIS FEW STENOTROPHOMONAS MALTOPHILIA FEW STAPHYLOCOCCUS AUREUS NO ANAEROBES ISOLATED; CULTURE  IN PROGRESS FOR 5 DAYS    REPTSTATUS PENDING 10/23/2023 1640   Studies/Results: No results found.  Medications:    (feeding supplement) PROSource Plus  30 mL Oral BID BM   amoxicillin -clavulanate  1 tablet Oral QHS   aspirin  EC  81 mg Oral Daily   Chlorhexidine  Gluconate Cloth  6 each Topical Q0600   cinacalcet   60 mg Oral Q T,Th,Sat-1800   darbepoetin (ARANESP ) injection - DIALYSIS  60 mcg Subcutaneous Q Fri-1800   gabapentin   100 mg Oral TID   heparin  injection (subcutaneous)  5,000 Units Subcutaneous Q8H   insulin  aspart  0-9 Units Subcutaneous TID WC   insulin  aspart  4 Units Subcutaneous TID WC   insulin  glargine  50 Units Subcutaneous Daily   multivitamin  1 tablet Oral QHS   mupirocin  cream   Topical QODAY   pantoprazole   40 mg Oral Daily   rosuvastatin   10 mg Oral Daily   sevelamer  carbonate  1,600 mg Oral TID WC    Dialysis Orders MWF -  NW 4hr, 400/A1.5, EDW 96.8kg, 2K/2.5Ca bath, AVF, Heparin  2600 - no ESA, Hgb > goal - Calcitriol 1.75 TIW   Assessment/Plan: Sepsis/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 On IV Vanc/Cefepime . Blood cultures NGTD. 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  ESRD.  Continue usual MWF HD - HD today Hypertension/volume. BP controlled, min LE edema, but low Na - inching EDW down. Anemia. Hgb variable - monitor without ESA for now. Metabolic bone disease.  CorrCa high sided, VDRA on hold. Phos above goal. Continue home binders/cinacalcet . Nutrition: Alb low, continue supplements. T2DM: Insulin  per primary. DVT prophylaxis: Prefer heparin  over Lovenox  in ESRD patient, AC on hold s/p surgery. Dispo: SNF recommended but sounds like he would prefer to go home.   [name] PA-C [facility] 10/27/2023,9:09 AM

## 2023-10-27 NOTE — Progress Notes (Signed)
 PROGRESS NOTE  Edgar Brewer  FMW:[id number] DOB: September 12, 1960 DOA: 10/19/2023 PCP: Katrinka Garnette KIDD, MD   Brief Narrative: Patient is a 12 male with history of ESRD on dialysis on Monday, Wednesday, Friday, HFpEF, diabetes type 2, prior CVA who was recently admitted for amputation of L 2nd toe through proximal phalanx  presented with complaint of purulent discharge in the left second toe, chills.  On presentation he was febrile, tachycardic, tachypneic, requiring 2 L of oxygen.  Labs with potassium 5.2, creatinine 5.7, lactic acidosis 5.6.XR L foot w/o acute findings or OM .  Patient was admitted for further management of sepsis secondary to left foot wound infection.  Started on broad spectrum antibiotics.  Podiatry, vascular surgery consulted.  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  PT/OT recommending SNF on discharge.  Waiting for bed availability.  Medically stable for discharge whenever possible.  Assessment & Plan:  Principal Problem:   Sepsis (HCC) Active Problems:   Pyogenic inflammation of bone (HCC)   Diabetes mellitus type 2 with complications (HCC)   ESRD on dialysis (HCC)   Neuropathy   PAD (peripheral artery disease)   Gangrene of left foot (HCC)  Sepsis secondary to surgical wound infection of the left foot: Recent history of amputation of L 2nd toe through proximal phalanx.  Presented with fever, chills, tachycardia, tachypnea, lactic acidosis.Started on broad spectrum antibiotics.  Podiatry, vascular surgery consulted.  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 Podiatry recommended nonweightbearing on postop shoe left lower extremity for 2 weeks. Culture showed mixed organisms.  Currently on Augmentin .  Blood cultures have been negative.  Type 2 diabetes: Continue current insulin  regimen.  Diabetic coordinator following  ESRD on dialysis/metabolic bone disease: Nephrology following for dialysis.  Continue Renvela , Sensipar .  Regularly  being dialyzed  Neuropathy: Gabapentin   Peripheral artery disease: Continue Crestor .  Follow-up with vascular surgery as an outpatient.  Obesity: BMI of 32  Constipation: Continue bowel regimen  Deconditioning/debility: PT recommended SNF on discharge.  Waiting for bed availability.  TOC following       DVT prophylaxis:heparin  injection 5,000 Units Start: 10/26/23 0915     Code Status: Full Code  Family Communication: Mother at bedside on 10/3  Patient status:Inpatient  Patient is from :Home  Anticipated discharge to:SNF  Estimated DC date:whenever possible   Consultants: Podiatry,vascular surgery  Procedures:transmetatarsal amputation of left foot   Antimicrobials:  Anti-infectives (From admission, onward)    Start     Dose/Rate Route Frequency Ordered Stop   10/27/23 2200  amoxicillin -clavulanate (AUGMENTIN ) 500-125 MG per tablet 1 tablet        1 tablet Oral Daily at bedtime 10/27/23 0849 11/01/23 2159   10/27/23 1000  amoxicillin -clavulanate (AUGMENTIN ) 500-125 MG per tablet 1 tablet  Status:  Discontinued        1 tablet Oral Every 12 hours 10/26/23 2155 10/27/23 0849   10/26/23 2245  amoxicillin -clavulanate (AUGMENTIN ) 500-125 MG per tablet 1 tablet  Status:  Discontinued        1 tablet Oral Every 12 hours 10/26/23 2154 10/26/23 2155   10/23/23 1800  vancomycin  (VANCOCIN ) IVPB 1000 mg/200 mL premix  Status:  Discontinued        1,000 mg 200 mL/hr over 60 Minutes Intravenous Every M-W-F (Hemodialysis) 10/23/23 1457 10/27/23 0846   10/20/23 1800  ceFEPIme  (MAXIPIME ) 1 g in sodium chloride  0.9 % 100 mL IVPB  Status:  Discontinued        1 g 200 mL/hr over  30 Minutes Intravenous Every 24 hours 10/19/23 1311 10/26/23 2154   10/20/23 1200  vancomycin  (VANCOCIN ) IVPB 1000 mg/200 mL premix  Status:  Discontinued        1,000 mg 200 mL/hr over 60 Minutes Intravenous Every M-W-F (Hemodialysis) 10/20/23 0725 10/23/23 1457   10/19/23 2200  metroNIDAZOLE  (FLAGYL )  tablet 500 mg  Status:  Discontinued        500 mg Oral Every 12 hours 10/19/23 1235 10/26/23 2154   10/19/23 1310  vancomycin  variable dose per unstable renal function (pharmacist dosing)  Status:  Discontinued         Does not apply See admin instructions 10/19/23 1311 10/20/23 0725   10/19/23 0845  ceFEPIme  (MAXIPIME ) 2 g in sodium chloride  0.9 % 100 mL IVPB        2 g 200 mL/hr over 30 Minutes Intravenous  Once 10/19/23 0839 10/19/23 1347   10/19/23 0845  metroNIDAZOLE  (FLAGYL ) IVPB 500 mg        500 mg 100 mL/hr over 60 Minutes Intravenous  Once 10/19/23 0839 10/19/23 1519   10/19/23 0845  vancomycin  (VANCOCIN ) IVPB 1000 mg/200 mL premix  Status:  Discontinued        1,000 mg 200 mL/hr over 60 Minutes Intravenous  Once 10/19/23 0839 10/19/23 0839   10/19/23 0845  vancomycin  (VANCOREADY) IVPB 2000 mg/400 mL        2,000 mg 200 mL/hr over 120 Minutes Intravenous STAT 10/19/23 0839 10/19/23 1207       Subjective: Patient seen and examined at bedside today.  He looks very comfortable.  Sitting on the chair.  Denies any new complaints except for no bowel movement for last few days.  Denies abdomen, nausea or vomiting.  Pain on the left foot is well-controlled.  Mother at bedside.  Objective: Vitals:   10/26/23 1732 10/26/23 1943 10/27/23 0413 10/27/23 0759  BP: (!) 107/51 (!) 122/51 (!) 114/55 126/60  Pulse: 81 84 81 86  Resp: 18 18 18 19   Temp: 98.2 F (36.8 C) 98.8 F (37.1 C) 97.7 F (36.5 C) 98.3 F (36.8 C)  TempSrc:  Oral Axillary   SpO2: 94% 93% 94% 94%  Weight:      Height:        Intake/Output Summary (Last 24 hours) at 10/27/2023 1125 Last data filed at 10/27/2023 0900 Gross per 24 hour  Intake 960 ml  Output 0 ml  Net 960 ml   Filed Weights   10/23/23 1250 10/25/23 0920 10/25/23 1308  Weight: 97.2 kg 98 kg 96 kg    Examination:  General exam: Overall comfortable, not in distress HEENT: PERRL Respiratory system:  no wheezes or crackles  Cardiovascular  system: S1 & S2 heard, RRR.  Gastrointestinal system: Abdomen is nondistended, soft and nontender. Central nervous system: Alert and oriented Extremities: No edema, no clubbing ,no cyanosis, amputation of the left foot, covered with dressing Skin: No rashes, no ulcers,no icterus     Data Reviewed: I have personally reviewed following labs and imaging studies  CBC: Recent Labs  Lab 10/24/23 0735 10/25/23 0359 10/25/23 0917 10/26/23 0206 10/27/23 0324  WBC 12.1* 9.4 8.6 9.9 10.6*  HGB 10.7* 9.5* 9.3* 9.9* 10.2*  HCT 32.8* 29.0* 28.3* 30.4* 30.9*  MCV 94.5 95.1 96.6 95.0 93.9  PLT 276 261 272 272 289   Basic Metabolic Panel: Recent Labs  Lab 10/24/23 0735 10/25/23 0359 10/25/23 0917 10/26/23 0206 10/27/23 0324  NA 132* 135 134* 132* 133*  K 5.2* 5.5*  5.5* 4.7 5.1  CL 92* 94* 95* 93* 91*  CO2 24 23 22 26 25   GLUCOSE 238* 267* 231* 213* 75  BUN 68* 85* 89* 50* 71*  CREATININE 10.43* 12.52* 12.94* 9.00* 11.18*  CALCIUM  9.8 9.3 9.1 9.2 9.5  PHOS 7.5*  --  8.6*  --   --      Recent Results (from the past 240 hours)  Resp panel by RT-PCR (RSV, Flu A&B, Covid) Anterior Nasal Swab     Status: None   Collection Time: 10/19/23  9:00 AM   Specimen: Anterior Nasal Swab  Result Value Ref Range Status   SARS Coronavirus 2 by RT PCR NEGATIVE NEGATIVE Final   Influenza A by PCR NEGATIVE NEGATIVE Final   Influenza B by PCR NEGATIVE NEGATIVE Final    Comment: (NOTE) The Xpert Xpress SARS-CoV-2/FLU/RSV plus assay is intended as an aid in the diagnosis of influenza from Nasopharyngeal swab specimens and should not be used as a sole basis for treatment. Nasal washings and aspirates are unacceptable for Xpert Xpress SARS-CoV-2/FLU/RSV testing.  Fact Sheet for Patients: BloggerCourse.com  Fact Sheet for Healthcare Providers: SeriousBroker.it  This test is not yet approved or cleared by the [place]  FDA and has been  authorized for detection and/or diagnosis of SARS-CoV-2 by FDA under an Emergency Use Authorization (EUA). This EUA will remain in effect (meaning this test can be used) for the duration of the COVID-19 declaration under Section 564(b)(1) of the Act, 21 U.S.C. section 360bbb-3(b)(1), unless the authorization is terminated or revoked.     Resp Syncytial Virus by PCR NEGATIVE NEGATIVE Final    Comment: (NOTE) Fact Sheet for Patients: BloggerCourse.com  Fact Sheet for Healthcare Providers: SeriousBroker.it  This test is not yet approved or cleared by the [place]  FDA and has been authorized for detection and/or diagnosis of SARS-CoV-2 by FDA under an Emergency Use Authorization (EUA). This EUA will remain in effect (meaning this test can be used) for the duration of the COVID-19 declaration under Section 564(b)(1) of the Act, 21 U.S.C. section 360bbb-3(b)(1), unless the authorization is terminated or revoked.  Performed at [facility], [address], [place], [place] [address]   Blood Culture (routine x 2)     Status: None   Collection Time: 10/19/23  9:00 AM   Specimen: BLOOD RIGHT ARM  Result Value Ref Range Status   Specimen Description BLOOD RIGHT ARM  Final   Special Requests   Final    BOTTLES DRAWN AEROBIC AND ANAEROBIC Blood Culture adequate volume   Culture   Final    NO GROWTH 5 DAYS Performed at [facility], [address] [place], [place] [address]    Report Status 10/24/2023 FINAL  Final  Blood Culture (routine x 2)     Status: None   Collection Time: 10/19/23  9:05 AM   Specimen: BLOOD RIGHT ARM  Result Value Ref Range Status   Specimen Description BLOOD RIGHT ARM  Final   Special Requests   Final    BOTTLES DRAWN AEROBIC AND ANAEROBIC Blood Culture adequate volume   Culture   Final    NO GROWTH 5 DAYS Performed at [facility], [address], [place], [place] [address]    Report  Status 10/24/2023 FINAL  Final  Aerobic/Anaerobic Culture w Gram Stain (surgical/deep wound)     Status: None (Preliminary result)   Collection Time: 10/23/23  4:40 PM   Specimen: Wound; Tissue  Result Value  Ref Range Status   Specimen Description TISSUE LEFT FOOT  Final   Special Requests NONE  Final   Gram Stain NO WBC SEEN RARE GRAM NEGATIVE RODS   Final   Culture   Final    MODERATE ENTEROCOCCUS FAECALIS FEW STENOTROPHOMONAS MALTOPHILIA FEW STAPHYLOCOCCUS AUREUS NO ANAEROBES ISOLATED; CULTURE IN PROGRESS FOR 5 DAYS    Report Status PENDING  Incomplete   Organism ID, Bacteria ENTEROCOCCUS FAECALIS  Final   Organism ID, Bacteria STAPHYLOCOCCUS AUREUS  Final      Susceptibility   Enterococcus faecalis - MIC*    AMPICILLIN  <=2 SENSITIVE Sensitive     VANCOMYCIN  2 SENSITIVE Sensitive     GENTAMICIN SYNERGY Value in next row Sensitive      SENSITIVEPerformed at [facility], [address], [place], [place] [address]    * MODERATE ENTEROCOCCUS FAECALIS   Staphylococcus aureus - MIC*    CIPROFLOXACIN Value in next row Sensitive      SENSITIVEPerformed at [facility], [address] [place], [place] [address]    ERYTHROMYCIN Value in next row Sensitive      SENSITIVEPerformed at [facility], [address], [place], [place] [address]    GENTAMICIN Value in next row Sensitive      SENSITIVEPerformed at [facility], [address] [place], [place] [address]    OXACILLIN Value in next row Sensitive      SENSITIVEPerformed at [facility], [address] [place], [place] [address]    TETRACYCLINE Value in next row Sensitive      SENSITIVEPerformed at [facility], [address] [place], [place] [address]    VANCOMYCIN  Value in next row Sensitive      SENSITIVEPerformed at [facility], [address], [place], [place] [address]    TRIMETH/SULFA Value in next row Sensitive      SENSITIVEPerformed at [facility], [address], [place], [place] [address]    CLINDAMYCIN  Value in next row Sensitive      SENSITIVEPerformed at [facility], [address] [place], [place] [address]    RIFAMPIN Value in next row Sensitive      SENSITIVEPerformed at [facility], [address], [place], [place] [address]    Inducible Clindamycin  Value in next row Sensitive      SENSITIVEPerformed at [facility], [address], [place], [place] [address]    LINEZOLID  Value in next row Sensitive      SENSITIVEPerformed at [facility], [address] [place], [place] [address]    * FEW STAPHYLOCOCCUS AUREUS     Radiology Studies: No results found.  Scheduled Meds:  (feeding supplement) PROSource Plus  30 mL Oral BID BM   amoxicillin -clavulanate  1 tablet Oral QHS   aspirin  EC  81 mg Oral Daily   Chlorhexidine  Gluconate Cloth  6 each Topical Q0600   cinacalcet   60 mg Oral Q T,Th,Sat-1800   darbepoetin (ARANESP ) injection - DIALYSIS  60 mcg Subcutaneous Q Fri-1800   gabapentin   100 mg Oral TID   heparin  injection (subcutaneous)  5,000 Units Subcutaneous Q8H   insulin  aspart  0-9 Units Subcutaneous TID WC   insulin  aspart  4 Units Subcutaneous TID WC   insulin  glargine  50 Units Subcutaneous Daily   multivitamin  1 tablet Oral QHS   mupirocin  cream   Topical QODAY   pantoprazole   40 mg Oral Daily   rosuvastatin   10 mg Oral Daily   sevelamer  carbonate  1,600 mg Oral TID WC   Continuous Infusions:  anticoagulant sodium citrate        LOS: 8 days   Ivonne Mustache, MD [facility] P10/03/2023, 11:25 AM

## 2023-10-28 ENCOUNTER — Other Ambulatory Visit: Payer: Self-pay

## 2023-10-28 DIAGNOSIS — A419 Sepsis, unspecified organism: Secondary | ICD-10-CM | POA: Diagnosis not present

## 2023-10-28 LAB — CBC WITH DIFFERENTIAL/PLATELET
Abs Immature Granulocytes: 0.22 K/uL — ABNORMAL HIGH (ref 0.00–0.07)
Basophils Absolute: 0.1 K/uL (ref 0.0–0.1)
Basophils Relative: 1 %
Eosinophils Absolute: 0.2 K/uL (ref 0.0–0.5)
Eosinophils Relative: 2 %
HCT: 32.1 % — ABNORMAL LOW (ref 39.0–52.0)
Hemoglobin: 10.6 g/dL — ABNORMAL LOW (ref 13.0–17.0)
Immature Granulocytes: 2 %
Lymphocytes Relative: 9 %
Lymphs Abs: 1 K/uL (ref 0.7–4.0)
MCH: 31.3 pg (ref 26.0–34.0)
MCHC: 33 g/dL (ref 30.0–36.0)
MCV: 94.7 fL (ref 80.0–100.0)
Monocytes Absolute: 0.7 K/uL (ref 0.1–1.0)
Monocytes Relative: 6 %
Neutro Abs: 9.2 K/uL — ABNORMAL HIGH (ref 1.7–7.7)
Neutrophils Relative %: 80 %
Platelets: 279 K/uL (ref 150–400)
RBC: 3.39 MIL/uL — ABNORMAL LOW (ref 4.22–5.81)
RDW: 13.6 % (ref 11.5–15.5)
WBC: 11.5 K/uL — ABNORMAL HIGH (ref 4.0–10.5)
nRBC: 0 % (ref 0.0–0.2)

## 2023-10-28 LAB — CBC
HCT: 34.2 % — ABNORMAL LOW (ref 39.0–52.0)
Hemoglobin: 11.3 g/dL — ABNORMAL LOW (ref 13.0–17.0)
MCH: 31 pg (ref 26.0–34.0)
MCHC: 33 g/dL (ref 30.0–36.0)
MCV: 94 fL (ref 80.0–100.0)
Platelets: 325 K/uL (ref 150–400)
RBC: 3.64 MIL/uL — ABNORMAL LOW (ref 4.22–5.81)
RDW: 13.8 % (ref 11.5–15.5)
WBC: 12.9 K/uL — ABNORMAL HIGH (ref 4.0–10.5)
nRBC: 0.2 % (ref 0.0–0.2)

## 2023-10-28 LAB — COMPREHENSIVE METABOLIC PANEL WITH GFR
ALT: 15 U/L (ref 0–44)
AST: 16 U/L (ref 15–41)
Albumin: 2.6 g/dL — ABNORMAL LOW (ref 3.5–5.0)
Alkaline Phosphatase: 215 U/L — ABNORMAL HIGH (ref 38–126)
Anion gap: 17 — ABNORMAL HIGH (ref 5–15)
BUN: 50 mg/dL — ABNORMAL HIGH (ref 8–23)
CO2: 26 mmol/L (ref 22–32)
Calcium: 9.9 mg/dL (ref 8.9–10.3)
Chloride: 89 mmol/L — ABNORMAL LOW (ref 98–111)
Creatinine, Ser: 2.57 mg/dL — ABNORMAL HIGH (ref 0.61–1.24)
GFR, Estimated: 27 mL/min — ABNORMAL LOW (ref >60)
Glucose, Bld: 137 mg/dL — ABNORMAL HIGH (ref 70–99)
Potassium: 4.8 mmol/L (ref 3.5–5.1)
Sodium: 132 mmol/L — ABNORMAL LOW (ref 135–145)
Total Bilirubin: 0.6 mg/dL (ref 0.0–1.2)
Total Protein: 6.9 g/dL (ref 6.5–8.1)

## 2023-10-28 LAB — BASIC METABOLIC PANEL WITH GFR
Anion gap: 15 (ref 5–15)
BUN: 43 mg/dL — ABNORMAL HIGH (ref 8–23)
CO2: 26 mmol/L (ref 22–32)
Calcium: 10.1 mg/dL (ref 8.9–10.3)
Chloride: 90 mmol/L — ABNORMAL LOW (ref 98–111)
Creatinine, Ser: 8.7 mg/dL — ABNORMAL HIGH (ref 0.61–1.24)
GFR, Estimated: 6 mL/min — ABNORMAL LOW (ref >60)
Glucose, Bld: 144 mg/dL — ABNORMAL HIGH (ref 70–99)
Potassium: 5 mmol/L (ref 3.5–5.1)
Sodium: 131 mmol/L — ABNORMAL LOW (ref 135–145)

## 2023-10-28 LAB — TROPONIN I (HIGH SENSITIVITY)
Troponin I (High Sensitivity): 15 ng/L (ref <18)
Troponin I (High Sensitivity): 15 ng/L (ref <18)

## 2023-10-28 LAB — GLUCOSE, CAPILLARY
Glucose-Capillary: 149 mg/dL — ABNORMAL HIGH (ref 70–99)
Glucose-Capillary: 211 mg/dL — ABNORMAL HIGH (ref 70–99)
Glucose-Capillary: 136 mg/dL — ABNORMAL HIGH (ref 70–99)
Glucose-Capillary: 204 mg/dL — ABNORMAL HIGH (ref 70–99)

## 2023-10-28 LAB — LACTIC ACID, PLASMA: Lactic Acid, Venous: 0.9 mmol/L (ref 0.5–1.9)

## 2023-10-28 LAB — TSH: TSH: 1.405 u[IU]/mL (ref 0.350–4.500)

## 2023-10-28 MED ORDER — EPINEPHRINE 1 MG/10ML IV SOSY
PREFILLED_SYRINGE | INTRAVENOUS | Status: AC
  Filled 2023-10-28: qty 10

## 2023-10-28 MED ORDER — ATROPINE SULFATE 1 MG/10ML IJ SOSY
PREFILLED_SYRINGE | INTRAMUSCULAR | Status: AC
  Filled 2023-10-28: qty 10

## 2023-10-28 NOTE — Progress Notes (Signed)
 0800: This RN to patient bedside, patient found standing in restroom alone with no assistive devices.Patient initially appears irritable with this nurses presence but allows RN to place walker in the restroom. Patient educated on L foot being non weightbearing and the need to utilize call bell for ambulation purposes. Education not accepted well. Patient produced stool while in the restroom.  Patient placed back in bed safely, bed alarm on. Hells floating with pillow. Assessment completed.  1217: [facility] messaged about ointment needed for wound care

## 2023-10-28 NOTE — Progress Notes (Signed)
   10/28/23 1437  Spiritual Encounters  Type of Visit Initial  Care provided to: Patient  Referral source Code page  Reason for visit Code  OnCall Visit Yes  Spiritual Framework  Presenting Themes Impactful experiences and emotions  Patient Stress Factors Health changes  Family Stress Factors None identified  Interventions  Spiritual Care Interventions Made Compassionate presence;Established relationship of care and support  Intervention Outcomes  Outcomes Awareness of support;Awareness of health   Chaplain responded to a medical alert (Code Blued). The medical team was successful in resuscitating the Pt. No family members were present during the event.  Chaplain remained available to provide emotional an spiritual support for the medical staff and for family upon arrival if needed.

## 2023-10-28 NOTE — Code Documentation (Signed)
  Patient Name: Edgar Brewer   MRN: [id number]   Date of Birth/ Sex: 05/30/1960 , male      Admission Date: 10/19/2023  Attending Provider: Jillian Buttery, MD  Primary Diagnosis: Sepsis [facility])   Indication: Pt was in his usual state of health until this PM, when he was noted to be bradycardia:30s. Code blue was subsequently called. At the time of arrival on scene, ACLS protocol was underway.   Technical Description:  - CPR performance duration:   Not performed  - Was defibrillation or cardioversion used? No   - Was external pacer placed? No  - Was patient intubated pre/post CPR? No   Medications Administered: Y = Yes; Blank = No Amiodarone    Atropine    Calcium     Epinephrine     Lidocaine     Magnesium     Norepinephrine    Phenylephrine     Sodium bicarbonate    Vasopressin     Post CPR evaluation:  - Final Status - Was patient successfully resuscitated ? Yes - What is current rhythm? Irregular possible type 1 vs type 2 AV block - What is current hemodynamic status? Stable heart rate of 60s, his awake and communicating with us  appropriately, declined chest pain and shortness of breath dizziness.  Miscellaneous Information:  - Labs sent, including: CBC,CMP,Lactic, Troponin  - Primary team notified?  Yes  - Family Notified? No  - Additional notes/ transfer status: Patient was not transferred back to primary team we will notify cardiology     Bernadine Manos, MD  10/28/2023, 1:52 PM

## 2023-10-28 NOTE — Progress Notes (Signed)
 PROGRESS NOTE  Edgar Brewer  FMW:[id number] DOB: 1960-08-06 DOA: 10/19/2023 PCP: Katrinka Garnette KIDD, MD   Brief Narrative: Patient is a 30 male with history of ESRD on dialysis on Monday, Wednesday, Friday, HFpEF, diabetes type 2, prior CVA who was recently admitted for amputation of L 2nd toe through proximal phalanx  presented with complaint of purulent discharge in the left second toe, chills.  On presentation he was febrile, tachycardic, tachypneic, requiring 2 L of oxygen.  Labs with potassium 5.2, creatinine 5.7, lactic acidosis 5.6.XR L foot w/o acute findings or OM .  Patient was admitted for further management of sepsis secondary to left foot wound infection.  Started on broad spectrum antibiotics.  Podiatry, vascular surgery consulted.  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  PT/OT recommending SNF on discharge.  Waiting for bed availability.  Medically stable for discharge whenever possible.  Assessment & Plan:  Principal Problem:   Sepsis (HCC) Active Problems:   Pyogenic inflammation of bone (HCC)   Diabetes mellitus type 2 with complications (HCC)   ESRD on dialysis (HCC)   Neuropathy   PAD (peripheral artery disease)   Gangrene of left foot (HCC)  Sepsis secondary to surgical wound infection of the left foot: Recent history of amputation of L 2nd toe through proximal phalanx.  Presented with fever, chills, tachycardia, tachypnea, lactic acidosis.Started on broad spectrum antibiotics.  Podiatry, vascular surgery consulted.  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 Podiatry recommended nonweightbearing on postop shoe left lower extremity for 2 weeks. Culture showed mixed organisms.  Currently on Augmentin .  Blood cultures have been negative.  Type 2 diabetes: Continue current insulin  regimen.  Diabetic coordinator following  ESRD on dialysis/metabolic bone disease: Nephrology following for dialysis.  Continue Renvela , Sensipar .  Regularly  being dialyzed  Neuropathy: Gabapentin   Peripheral artery disease: Continue Crestor .  Follow-up with vascular surgery as an outpatient.  Obesity: BMI of 32  Constipation: Continue bowel regimen  Deconditioning/debility: PT recommended SNF on discharge.  Waiting for bed availability.  TOC following       DVT prophylaxis:heparin  injection 5,000 Units Start: 10/26/23 0915     Code Status: Full Code  Family Communication: Mother at bedside on 10/3  Patient status:Inpatient  Patient is from :Home  Anticipated discharge to:SNF  Estimated DC date:whenever possible   Consultants: Podiatry,vascular surgery  Procedures:transmetatarsal amputation of left foot   Antimicrobials:  Anti-infectives (From admission, onward)    Start     Dose/Rate Route Frequency Ordered Stop   10/27/23 2200  amoxicillin -clavulanate (AUGMENTIN ) 500-125 MG per tablet 1 tablet        1 tablet Oral Daily at bedtime 10/27/23 0849 11/01/23 2159   10/27/23 1000  amoxicillin -clavulanate (AUGMENTIN ) 500-125 MG per tablet 1 tablet  Status:  Discontinued        1 tablet Oral Every 12 hours 10/26/23 2155 10/27/23 0849   10/26/23 2245  amoxicillin -clavulanate (AUGMENTIN ) 500-125 MG per tablet 1 tablet  Status:  Discontinued        1 tablet Oral Every 12 hours 10/26/23 2154 10/26/23 2155   10/23/23 1800  vancomycin  (VANCOCIN ) IVPB 1000 mg/200 mL premix  Status:  Discontinued        1,000 mg 200 mL/hr over 60 Minutes Intravenous Every M-W-F (Hemodialysis) 10/23/23 1457 10/27/23 0846   10/20/23 1800  ceFEPIme  (MAXIPIME ) 1 g in sodium chloride  0.9 % 100 mL IVPB  Status:  Discontinued        1 g 200 mL/hr over  30 Minutes Intravenous Every 24 hours 10/19/23 1311 10/26/23 2154   10/20/23 1200  vancomycin  (VANCOCIN ) IVPB 1000 mg/200 mL premix  Status:  Discontinued        1,000 mg 200 mL/hr over 60 Minutes Intravenous Every M-W-F (Hemodialysis) 10/20/23 0725 10/23/23 1457   10/19/23 2200  metroNIDAZOLE  (FLAGYL )  tablet 500 mg  Status:  Discontinued        500 mg Oral Every 12 hours 10/19/23 1235 10/26/23 2154   10/19/23 1310  vancomycin  variable dose per unstable renal function (pharmacist dosing)  Status:  Discontinued         Does not apply See admin instructions 10/19/23 1311 10/20/23 0725   10/19/23 0845  ceFEPIme  (MAXIPIME ) 2 g in sodium chloride  0.9 % 100 mL IVPB        2 g 200 mL/hr over 30 Minutes Intravenous  Once 10/19/23 0839 10/19/23 1347   10/19/23 0845  metroNIDAZOLE  (FLAGYL ) IVPB 500 mg        500 mg 100 mL/hr over 60 Minutes Intravenous  Once 10/19/23 0839 10/19/23 1519   10/19/23 0845  vancomycin  (VANCOCIN ) IVPB 1000 mg/200 mL premix  Status:  Discontinued        1,000 mg 200 mL/hr over 60 Minutes Intravenous  Once 10/19/23 0839 10/19/23 0839   10/19/23 0845  vancomycin  (VANCOREADY) IVPB 2000 mg/400 mL        2,000 mg 200 mL/hr over 120 Minutes Intravenous STAT 10/19/23 0839 10/19/23 1207       Subjective: Patient seen and examined at the bedside today.  Comfortable.  Sitting on the chair.  No new complaints.  Hemodynamically stable.  Waiting for SNF bed  Objective: Vitals:   10/27/23 1803 10/27/23 1931 10/28/23 0355 10/28/23 0805  BP: (!) 111/50 (!) 128/54 (!) 111/57 129/70  Pulse: 85 91 87 96  Resp: (!) 26 18 16 20   Temp: 97.9 F (36.6 C) 98.9 F (37.2 C) 98.6 F (37 C) 98.3 F (36.8 C)  TempSrc:      SpO2: 92% 94% 93% 94%  Weight: 97 kg     Height:        Intake/Output Summary (Last 24 hours) at 10/28/2023 1146 Last data filed at 10/28/2023 0900 Gross per 24 hour  Intake 1020 ml  Output 2000 ml  Net -980 ml   Filed Weights   10/25/23 1308 10/27/23 1446 10/27/23 1803  Weight: 96 kg 99.1 kg 97 kg    Examination:   General exam: Overall comfortable, not in distress HEENT: PERRL Respiratory system:  no wheezes or crackles  Cardiovascular system: S1 & S2 heard, RRR.  Gastrointestinal system: Abdomen is nondistended, soft and nontender. Central nervous  system: Alert and oriented Extremities: No edema, no clubbing ,no cyanosis, amputation of the left foot/covered with dressing Skin: No rashes, no ulcers,no icterus     Data Reviewed: I have personally reviewed following labs and imaging studies  CBC: Recent Labs  Lab 10/25/23 0359 10/25/23 0917 10/26/23 0206 10/27/23 0324 10/28/23 0751  WBC 9.4 8.6 9.9 10.6* 12.9*  HGB 9.5* 9.3* 9.9* 10.2* 11.3*  HCT 29.0* 28.3* 30.4* 30.9* 34.2*  MCV 95.1 96.6 95.0 93.9 94.0  PLT 261 272 272 289 325   Basic Metabolic Panel: Recent Labs  Lab 10/24/23 0735 10/25/23 0359 10/25/23 0917 10/26/23 0206 10/27/23 0324 10/28/23 0751  NA 132* 135 134* 132* 133* 131*  K 5.2* 5.5* 5.5* 4.7 5.1 5.0  CL 92* 94* 95* 93* 91* 90*  CO2 24 23 22  26 25 26   GLUCOSE 238* 267* 231* 213* 75 144*  BUN 68* 85* 89* 50* 71* 43*  CREATININE 10.43* 12.52* 12.94* 9.00* 11.18* 8.70*  CALCIUM  9.8 9.3 9.1 9.2 9.5 10.1  PHOS 7.5*  --  8.6*  --   --   --      Recent Results (from the past 240 hours)  Resp panel by RT-PCR (RSV, Flu A&B, Covid) Anterior Nasal Swab     Status: None   Collection Time: 10/19/23  9:00 AM   Specimen: Anterior Nasal Swab  Result Value Ref Range Status   SARS Coronavirus 2 by RT PCR NEGATIVE NEGATIVE Final   Influenza A by PCR NEGATIVE NEGATIVE Final   Influenza B by PCR NEGATIVE NEGATIVE Final    Comment: (NOTE) The Xpert Xpress SARS-CoV-2/FLU/RSV plus assay is intended as an aid in the diagnosis of influenza from Nasopharyngeal swab specimens and should not be used as a sole basis for treatment. Nasal washings and aspirates are unacceptable for Xpert Xpress SARS-CoV-2/FLU/RSV testing.  Fact Sheet for Patients: BloggerCourse.com  Fact Sheet for Healthcare Providers: SeriousBroker.it  This test is not yet approved or cleared by the [place]  FDA and has been authorized for detection and/or diagnosis of SARS-CoV-2 by FDA under  an Emergency Use Authorization (EUA). This EUA will remain in effect (meaning this test can be used) for the duration of the COVID-19 declaration under Section 564(b)(1) of the Act, 21 U.S.C. section 360bbb-3(b)(1), unless the authorization is terminated or revoked.     Resp Syncytial Virus by PCR NEGATIVE NEGATIVE Final    Comment: (NOTE) Fact Sheet for Patients: BloggerCourse.com  Fact Sheet for Healthcare Providers: SeriousBroker.it  This test is not yet approved or cleared by the [place]  FDA and has been authorized for detection and/or diagnosis of SARS-CoV-2 by FDA under an Emergency Use Authorization (EUA). This EUA will remain in effect (meaning this test can be used) for the duration of the COVID-19 declaration under Section 564(b)(1) of the Act, 21 U.S.C. section 360bbb-3(b)(1), unless the authorization is terminated or revoked.  Performed at [facility], [address] [place], [place] [address]   Blood Culture (routine x 2)     Status: None   Collection Time: 10/19/23  9:00 AM   Specimen: BLOOD RIGHT ARM  Result Value Ref Range Status   Specimen Description BLOOD RIGHT ARM  Final   Special Requests   Final    BOTTLES DRAWN AEROBIC AND ANAEROBIC Blood Culture adequate volume   Culture   Final    NO GROWTH 5 DAYS Performed at [facility], [address] [place], [place] [address]    Report Status 10/24/2023 FINAL  Final  Blood Culture (routine x 2)     Status: None   Collection Time: 10/19/23  9:05 AM   Specimen: BLOOD RIGHT ARM  Result Value Ref Range Status   Specimen Description BLOOD RIGHT ARM  Final   Special Requests   Final    BOTTLES DRAWN AEROBIC AND ANAEROBIC Blood Culture adequate volume   Culture   Final    NO GROWTH 5 DAYS Performed at [facility], [address], [place], [place] [address]    Report Status 10/24/2023 FINAL  Final  Aerobic/Anaerobic Culture w Gram  Stain (surgical/deep wound)     Status: None (Preliminary result)   Collection Time: 10/23/23  4:40 PM   Specimen: Wound; Tissue  Result Value Ref Range Status   Specimen Description  TISSUE LEFT FOOT  Final   Special Requests NONE  Final   Gram Stain NO WBC SEEN RARE GRAM NEGATIVE RODS   Final   Culture   Final    MODERATE ENTEROCOCCUS FAECALIS FEW STENOTROPHOMONAS MALTOPHILIA FEW STAPHYLOCOCCUS AUREUS NO ANAEROBES ISOLATED; CULTURE IN PROGRESS FOR 5 DAYS Sent to [facility] for further susceptibility testing. Performed at [facility], [address] [place], [place] [address]    Report Status PENDING  Incomplete   Organism ID, Bacteria ENTEROCOCCUS FAECALIS  Final   Organism ID, Bacteria STAPHYLOCOCCUS AUREUS  Final      Susceptibility   Enterococcus faecalis - MIC*    AMPICILLIN  <=2 SENSITIVE Sensitive     VANCOMYCIN  2 SENSITIVE Sensitive     GENTAMICIN SYNERGY SENSITIVE Sensitive     * MODERATE ENTEROCOCCUS FAECALIS   Staphylococcus aureus - MIC*    CIPROFLOXACIN <=0.5 SENSITIVE Sensitive     ERYTHROMYCIN <=0.25 SENSITIVE Sensitive     GENTAMICIN <=0.5 SENSITIVE Sensitive     OXACILLIN 0.5 SENSITIVE Sensitive     TETRACYCLINE <=1 SENSITIVE Sensitive     VANCOMYCIN  <=0.5 SENSITIVE Sensitive     TRIMETH/SULFA <=10 SENSITIVE Sensitive     CLINDAMYCIN  <=0.25 SENSITIVE Sensitive     RIFAMPIN <=0.5 SENSITIVE Sensitive     Inducible Clindamycin  NEGATIVE Sensitive     LINEZOLID  2 SENSITIVE Sensitive     * FEW STAPHYLOCOCCUS AUREUS  Susceptibility, Aer + Anaerob     Status: Abnormal   Collection Time: 10/23/23  4:40 PM  Result Value Ref Range Status   Suscept, Aer + Anaerob Preliminary report (A)  Final    Comment: (NOTE) Performed At: [facility] [employer] [address] [place], [place] [id number] Jennette Shorter MD Ey:[id number]    Source STENOTROPHOMONAS MALTOPHILIA/ TISSUE LEFT FOOT  Final    Comment: Performed at [facility], [address], [place],  [place] [address]  Susceptibility Result     Status: Abnormal   Collection Time: 10/23/23  4:40 PM  Result Value Ref Range Status   Suscept Result 1 Comment (A)  Final    Comment: (NOTE) Stenotrophomonas maltophilia Identification performed by account, not confirmed by this laboratory. Performed At: [facility] [address] [place], [place] [id number] Jennette Shorter MD Ey:[id number]      Radiology Studies: No results found.  Scheduled Meds:  (feeding supplement) PROSource Plus  30 mL Oral BID BM   amoxicillin -clavulanate  1 tablet Oral QHS   aspirin  EC  81 mg Oral Daily   Chlorhexidine  Gluconate Cloth  6 each Topical Q0600   cinacalcet   60 mg Oral Q T,Th,Sat-1800   darbepoetin (ARANESP ) injection - DIALYSIS  60 mcg Subcutaneous Q Fri-1800   gabapentin   100 mg Oral TID   heparin  injection (subcutaneous)  5,000 Units Subcutaneous Q8H   insulin  aspart  0-9 Units Subcutaneous TID WC   insulin  aspart  4 Units Subcutaneous TID WC   insulin  glargine  50 Units Subcutaneous Daily   multivitamin  1 tablet Oral QHS   mupirocin  cream   Topical QODAY   pantoprazole   40 mg Oral Daily   polyethylene glycol  17 g Oral Daily   rosuvastatin   10 mg Oral Daily   senna-docusate  1 tablet Oral BID   sevelamer  carbonate  1,600 mg Oral TID WC   Continuous Infusions:     LOS: 9 days   Ivonne Mustache, MD [facility] P10/04/2023, 11:46 AM

## 2023-10-28 NOTE — Progress Notes (Signed)
 I was called because patient became bradycardic. Rapid response was called.  When I arrived, patient had a stable blood pressure, remains asymptomatic and heart rate was in the range of 60s.  EKG showed second-degree AV block, Mobitz type I. We have requested cardiology consultation.  He is not on any AV nodal blocking agents.  Will get TSH, TEE Continue to monitor on telemetry

## 2023-10-28 NOTE — Progress Notes (Signed)
 Mobility Specialist Progress Note:    10/28/23 1102  Mobility  Activity Pivoted/transferred from bed to chair  Level of Assistance Minimal assist, patient does 75% or more  Assistive Device Front wheel walker  Distance Ambulated (ft) 5 ft  LLE Weight Bearing Per Provider Order NWB  Activity Response Tolerated well  Mobility Referral Yes  Mobility visit 1 Mobility  Mobility Specialist Start Time (ACUTE ONLY) 0931  Mobility Specialist Stop Time (ACUTE ONLY) 0943  Mobility Specialist Time Calculation (min) (ACUTE ONLY) 12 min   Received pt in bed and agreeable to mobility. Pt required MinA STS, otherwise MinG. No c/o. Pt left in chair. Personal belongings and call light within reach. All needs met. NT aware.  Lavanda Pollack Mobility Specialist  Please contact via [employer] or  Rehab Office [phone]

## 2023-10-28 NOTE — Significant Event (Signed)
 1320: This RN to patients bedside to pass afternoon medications and get patient from chair to bed. Patient requesting pain medication. Medication pulled but held as patient is very drowsy and difficult to arouse. Vitals obtained (charted) and patient is found to be hypotensive and bradycardic. Dinomap read 30s for HR, radial pulse felt for 60s, patient HR is 42bpm. Assist button utilized, D, RN (Charge), Aski, RN, [place], NT to bedside at 1323 1324: Crash cart to bedside, patient placed on pads, Dr, Jillian urgently/priority secure chatted, asks about patient's BP, BP stated, Dr. Jillian states in route.  1325: D, RN Museum/gallery curator) initiates Code [employer]. Patient has spontaneous eye opening and is able to communicate but very droawsy and lethargis, HR on Zoll reads in 40-50s.  1326: Rapid Reponse Team notified  1329: Rapid Response Rns Ceola, RN and Newell, Charity fundraiser) to bedside  1330: Dr. Bernadine and Dr. Heddy (Code Team) arrives bedside  1340: Dr. Jillian to bedside  1341: EKG transmitted: shows AV Block: 2nd Degree,  1344: Per provider patient to remain on 13M unit at this time  1400: All providers away from bedside  1430: Rapid Response RN away from bedside  1530: Crash cart removed from room, patient remains on pads

## 2023-10-28 NOTE — Progress Notes (Signed)
 [facility] Progress Note   Subjective:   Patient seen and examined at bedside.  Eating breakfast.  Denies pain, SOB, chest pain, nausea, vomiting and diarrhea.  Admits to intermittent tremor.  States he didn't sleep great last night.    SNF recommended on discharge.   This afternoon after seeing him Code Blue/rapid response documented due to bradycardia.    Objective Vitals:   10/27/23 1803 10/27/23 1931 10/28/23 0355 10/28/23 0805  BP: (!) 111/50 (!) 128/54 (!) 111/57 129/70  Pulse: 85 91 87 96  Resp: (!) 26 18 16 20   Temp: 97.9 F (36.6 C) 98.9 F (37.2 C) 98.6 F (37 C) 98.3 F (36.8 C)  TempSrc:      SpO2: 92% 94% 93% 94%  Weight: 97 kg     Height:       Physical Exam General:chronically ill appearing male in NAD Heart:RRR, no mrg Lungs:CTAB, nml WOB on RA Abdomen:soft, NTD Extremities:trace LE edema, L foot dressed Dialysis Access: LU AVF   Filed Weights   10/25/23 1308 10/27/23 1446 10/27/23 1803  Weight: 96 kg 99.1 kg 97 kg    Intake/Output Summary (Last 24 hours) at 10/28/2023 1552 Last data filed at 10/28/2023 1305 Gross per 24 hour  Intake 1020 ml  Output 2000 ml  Net -980 ml    Additional Objective Labs: Basic Metabolic Panel: Recent Labs  Lab 10/24/23 0735 10/25/23 0359 10/25/23 0917 10/26/23 0206 10/27/23 0324 10/28/23 0751  NA 132*   < > 134* 132* 133* 131*  K 5.2*   < > 5.5* 4.7 5.1 5.0  CL 92*   < > 95* 93* 91* 90*  CO2 24   < > 22 26 25 26   GLUCOSE 238*   < > 231* 213* 75 144*  BUN 68*   < > 89* 50* 71* 43*  CREATININE 10.43*   < > 12.94* 9.00* 11.18* 8.70*  CALCIUM  9.8   < > 9.1 9.2 9.5 10.1  PHOS 7.5*  --  8.6*  --   --   --    < > = values in this interval not displayed.   Liver Function Tests: Recent Labs  Lab 10/24/23 0735 10/25/23 0917  AST 22  --   ALT 21  --   ALKPHOS 350*  --   BILITOT 0.8  --   PROT 7.1  --   ALBUMIN 2.7* 2.4*   CBC: Recent Labs  Lab 10/25/23 0359 10/25/23 0917 10/26/23 0206  10/27/23 0324 10/28/23 0751  WBC 9.4 8.6 9.9 10.6* 12.9*  HGB 9.5* 9.3* 9.9* 10.2* 11.3*  HCT 29.0* 28.3* 30.4* 30.9* 34.2*  MCV 95.1 96.6 95.0 93.9 94.0  PLT 261 272 272 289 325    Medications:   (feeding supplement) PROSource Plus  30 mL Oral BID BM   amoxicillin -clavulanate  1 tablet Oral QHS   aspirin  EC  81 mg Oral Daily   atropine       Chlorhexidine  Gluconate Cloth  6 each Topical Q0600   cinacalcet   60 mg Oral Q T,Th,Sat-1800   darbepoetin (ARANESP ) injection - DIALYSIS  60 mcg Subcutaneous Q Fri-1800   EPINEPHrine        gabapentin   100 mg Oral TID   heparin  injection (subcutaneous)  5,000 Units Subcutaneous Q8H   insulin  aspart  0-9 Units Subcutaneous TID WC   insulin  aspart  4 Units Subcutaneous TID WC   insulin  glargine  50 Units Subcutaneous Daily   multivitamin  1 tablet Oral QHS  mupirocin  cream   Topical QODAY   pantoprazole   40 mg Oral Daily   polyethylene glycol  17 g Oral Daily   rosuvastatin   10 mg Oral Daily   senna-docusate  1 tablet Oral BID   sevelamer  carbonate  1,600 mg Oral TID WC    Dialysis Orders: MWF - NW 4hr, 400/A1.5, EDW 96.8kg, 2K/2.5Ca bath, AVF, Heparin  2600 - no ESA, Hgb > goal - Calcitriol 1.75 TIW   Assessment/Plan: Sepsis/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 On IV Vanc/Cefepime . Blood cultures NGTD. 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  ESRD.  Continue usual MWF HD - Next HD on 10/30/23.  Hypertension/volume. BP controlled, min LE edema, but low Na - UF as tolerated. Anemia. 10.6. No indication for ESA at this time.  Metabolic bone disease.  CorrCa high sided, VDRA on hold. Phos above goal. Continue home binders/cinacalcet . Nutrition: Alb low, continue supplements.  Renal diet w/fluid restrictions.  T2DM: Insulin  per primary. DVT prophylaxis: Prefer heparin  over Lovenox  in ESRD patient, AC on hold s/p surgery. Dispo: SNF recommended by PT Bradycardia - HR to 30s today. Code blue/rapid response called.  Cardiology  consulted.   Manuelita Labella, PA-C [facility] 10/28/2023,3:52 PM  LOS: 9 days

## 2023-10-28 NOTE — Significant Event (Signed)
 Rapid Response Event Note   Reason for Call :  Symptomatic Bradycardia,  HR 30s, BP 96/45 Code Blue Team Activated.  Per RN when she entered the room to give the patient his afternoon medications he seemed more drowsy that usual.  When she checked his VS his HR was in the 30s and he appeared dusky.  Initial Focused Assessment:  Upon my arrival Code Team at bedside.  Patient is alert and oriented but drowsy.  He is warm and dry.  He denies chest pain, shortness of breath or dizziness.  His complaint is some pain in his left foot.    BP 11/40  HR 60s  RR 25  O2 sat 96% on RA   Interventions:  12 lead EKG done Placed on cardiac tele  Plan of Care:     Event Summary:   MD Notified: Dr Jillian notified by staff Call Time: 1326  Arrival Time: 1329 End Time: 1430  Elvin [place], RN

## 2023-10-29 ENCOUNTER — Other Ambulatory Visit: Payer: Self-pay

## 2023-10-29 DIAGNOSIS — I441 Atrioventricular block, second degree: Secondary | ICD-10-CM | POA: Diagnosis not present

## 2023-10-29 DIAGNOSIS — A419 Sepsis, unspecified organism: Secondary | ICD-10-CM | POA: Diagnosis not present

## 2023-10-29 DIAGNOSIS — R001 Bradycardia, unspecified: Secondary | ICD-10-CM | POA: Diagnosis not present

## 2023-10-29 LAB — BASIC METABOLIC PANEL WITH GFR
Anion gap: 17 — ABNORMAL HIGH (ref 5–15)
BUN: 55 mg/dL — ABNORMAL HIGH (ref 8–23)
CO2: 25 mmol/L (ref 22–32)
Calcium: 9.4 mg/dL (ref 8.9–10.3)
Chloride: 89 mmol/L — ABNORMAL LOW (ref 98–111)
Creatinine, Ser: 11.05 mg/dL — ABNORMAL HIGH (ref 0.61–1.24)
GFR, Estimated: 5 mL/min — ABNORMAL LOW (ref >60)
Glucose, Bld: 118 mg/dL — ABNORMAL HIGH (ref 70–99)
Potassium: 5.2 mmol/L — ABNORMAL HIGH (ref 3.5–5.1)
Sodium: 131 mmol/L — ABNORMAL LOW (ref 135–145)

## 2023-10-29 LAB — HEPATITIS B SURFACE ANTIGEN: Hepatitis B Surface Ag: NONREACTIVE

## 2023-10-29 LAB — GLUCOSE, CAPILLARY
Glucose-Capillary: 115 mg/dL — ABNORMAL HIGH (ref 70–99)
Glucose-Capillary: 126 mg/dL — ABNORMAL HIGH (ref 70–99)
Glucose-Capillary: 176 mg/dL — ABNORMAL HIGH (ref 70–99)
Glucose-Capillary: 68 mg/dL — ABNORMAL LOW (ref 70–99)
Glucose-Capillary: 86 mg/dL (ref 70–99)
Glucose-Capillary: 187 mg/dL — ABNORMAL HIGH (ref 70–99)

## 2023-10-29 MED ORDER — SODIUM ZIRCONIUM CYCLOSILICATE 10 G PO PACK
10.0000 g | PACK | Freq: Once | ORAL | Status: AC
  Administered 2023-10-29: 10 g via ORAL
  Filled 2023-10-29: qty 1

## 2023-10-29 MED ORDER — CHLORHEXIDINE GLUCONATE CLOTH 2 % EX PADS
6.0000 | MEDICATED_PAD | Freq: Every day | CUTANEOUS | Status: DC
  Administered 2023-10-30: 6 via TOPICAL

## 2023-10-29 NOTE — Progress Notes (Signed)
 Upon reviewing the telemetry monitoring on the unit, it was noted that the patient had been documented as being in AFIB at 0204.  Upon review, it did not appear to be AFIB.  Upon assessment, patient was sleeping but awakened easily and with much irritability.  VS @ 0338, BP was 106/53, HR=67, 94 RA.  He was very sweaty - CBG was 115.  Patient had no c/o pain.  EKG done and showed 2 Degree HB Type II.  Will continue to monitor patient.  Suzen Ice RN

## 2023-10-29 NOTE — Consult Note (Signed)
   Cardiology Consultation   Patient ID: Edgar Brewer MRN: [id number]; DOB: May 11, 1960  Admit date: 10/19/2023 Date of Consult: 10/29/2023  PCP:  Edgar Garnette KIDD, MD   History of Present Illness:   Edgar Brewer is a 63 year old man with an extensive past medical history who I am seeing today for an evaluation of possible bradycardia.  The patient has a history of end-stage renal disease on dialysis, diabetes, prior stroke.  He was recently admitted for an amputation of the left toe for gangrene.  He was admitted on 25 September with infection of the site.  He was ultimately discharged October 4.  He developed Wenckebach on 4 October with a low pulse rate.  Cardiology was asked to weigh in.  He reports no syncope or presyncope.  No lightheadedness.  He did not know something was wrong with his heart rhythm.  Past medical, surgical, social and family history reviewed.  ROS:  Please see the history of present illness.  All other ROS reviewed and negative.     Physical Exam/Data:   Vitals:   10/28/23 2003 10/29/23 0338 10/29/23 0850 10/29/23 0852  BP: (!) 102/46 (!) 106/53 (!) 109/55 (!) 102/50  Pulse: 63 88 (!) 57 84  Resp: (!) 24 18 18 19   Temp: 98.6 F (37 C) 98.4 F (36.9 C) 98.6 F (37 C) 98.6 F (37 C)  TempSrc: Oral Oral    SpO2: 94% 94% (!) 83% 93%  Weight:      Height:        General: Chronically ill-appearing.  In bed at 45 degrees. Cardiac:  normal S1, S2; irregular rhythm; no murmur  Lungs:  clear to auscultation bilaterally, no wheezing, rhonchi or rales  Psych:  Normal affect   EKG:  The EKG was personally reviewed and demonstrates: Sinus rhythm with Wenckebach.  Ventricular rates in the 60s. Telemetry:  Telemetry was personally reviewed and demonstrates: Sinus rhythm.  Wenckebach.    Assessment and Plan:   #Wenckebach EKGs demonstrate sinus rhythm with Wenckebach conduction.  Rates are okay.  No syncope or presyncope.  I do not think any additional workup is  indicated.  Maintain telemetry while inpatient. Keep K greater than 4 and mag greater than 2 Avoid AV nodal blockers.  #End-stage renal disease Dialysis and electrolyte management per primary team    Ole T. Cindie, MD, [facility], [facility] Cardiac Electrophysiology

## 2023-10-29 NOTE — Progress Notes (Signed)
 PROGRESS NOTE  Edgar Brewer  FMW:[id number] DOB: [birth year] DOA: 10/19/2023 PCP: Katrinka Garnette KIDD, MD   Brief Narrative: Patient is a 64 male with history of ESRD on dialysis on Monday, Wednesday, Friday, HFpEF, diabetes type 2, prior CVA who was recently admitted for amputation of L 2nd toe through proximal phalanx  presented with complaint of purulent discharge in the left second toe, chills.  On presentation he was febrile, tachycardic, tachypneic, requiring 2 L of oxygen.  Labs with potassium 5.2, creatinine 5.7, lactic acidosis 5.6.XR L foot w/o acute findings or OM .  Patient was admitted for further management of sepsis secondary to left foot wound infection.  Started on broad spectrum antibiotics.  Podiatry, vascular surgery consulted.  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  PT/OT recommending SNF on discharge.  Hospital course remarkable for bradycardia now resolved.  He is waiting for bed availability for SNF.  Medically stable for discharge whenever possible.  Assessment & Plan:  Principal Problem:   Sepsis (HCC) Active Problems:   Pyogenic inflammation of bone (HCC)   Diabetes mellitus type 2 with complications (HCC)   ESRD on dialysis (HCC)   Neuropathy   PAD (peripheral artery disease)   Gangrene of left foot (HCC)  Sepsis secondary to surgical wound infection of the left foot: Recent history of amputation of L 2nd toe through proximal phalanx.  Presented with fever, chills, tachycardia, tachypnea, lactic acidosis.Started on broad spectrum antibiotics.  Podiatry, vascular surgery consulted.  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 Podiatry recommended nonweightbearing on postop shoe left lower extremity for 2 weeks. Culture showed mixed organisms.  Currently on Augmentin .  Blood cultures have been negative.  Bradycardia/secondary heart block: Became bradycardia in the afternoon of 10/4.  Did not require atropine or temporary pacemaker.   Heart rate spontaneously stabilized.  Blood pressure remained stable.  Cardiology consulted.  TSH normal.  EKG shows second-degree heart block, Mobitz type I/Wenckebach.  Heart rate remained stable.  No additional workup as indicated by cardiology.  Monitor on telemetry.  Continue to monitor electrolytes.  Avoid AV nodal blocking agents  Type 2 diabetes: Continue current insulin  regimen.  Diabetic coordinator following  ESRD on dialysis/metabolic bone disease: Nephrology following for dialysis.  Continue Renvela , Sensipar .  Dialyzed on schedule. Given a dose of Lokelma  for potassium of 5.2 today.  Neuropathy: Gabapentin   Peripheral artery disease: Continue Crestor .  Follow-up with vascular surgery as an outpatient.  Obesity: BMI of 32  Constipation: Continue bowel regimen  Deconditioning/debility: PT recommended SNF on discharge.  Waiting for bed availability.  TOC following       DVT prophylaxis:heparin  injection 5,000 Units Start: 10/26/23 0915     Code Status: Full Code  Family Communication: Mother at bedside on 10/3  Patient status:Inpatient  Patient is from :Home  Anticipated discharge to:SNF  Estimated DC date:whenever possible   Consultants: Podiatry,vascular surgery  Procedures:transmetatarsal amputation of left foot   Antimicrobials:  Anti-infectives (From admission, onward)    Start     Dose/Rate Route Frequency Ordered Stop   10/27/23 2200  amoxicillin -clavulanate (AUGMENTIN ) 500-125 MG per tablet 1 tablet        1 tablet Oral Daily at bedtime 10/27/23 0849 11/01/23 2159   10/27/23 1000  amoxicillin -clavulanate (AUGMENTIN ) 500-125 MG per tablet 1 tablet  Status:  Discontinued        1 tablet Oral Every 12 hours 10/26/23 2155 10/27/23 0849   10/26/23 2245  amoxicillin -clavulanate (AUGMENTIN ) 500-125 MG per tablet  1 tablet  Status:  Discontinued        1 tablet Oral Every 12 hours 10/26/23 2154 10/26/23 2155   10/23/23 1800  vancomycin  (VANCOCIN ) IVPB 1000  mg/200 mL premix  Status:  Discontinued        1,000 mg 200 mL/hr over 60 Minutes Intravenous Every M-W-F (Hemodialysis) 10/23/23 1457 10/27/23 0846   10/20/23 1800  ceFEPIme  (MAXIPIME ) 1 g in sodium chloride  0.9 % 100 mL IVPB  Status:  Discontinued        1 g 200 mL/hr over 30 Minutes Intravenous Every 24 hours 10/19/23 1311 10/26/23 2154   10/20/23 1200  vancomycin  (VANCOCIN ) IVPB 1000 mg/200 mL premix  Status:  Discontinued        1,000 mg 200 mL/hr over 60 Minutes Intravenous Every M-W-F (Hemodialysis) 10/20/23 0725 10/23/23 1457   10/19/23 2200  metroNIDAZOLE  (FLAGYL ) tablet 500 mg  Status:  Discontinued        500 mg Oral Every 12 hours 10/19/23 1235 10/26/23 2154   10/19/23 1310  vancomycin  variable dose per unstable renal function (pharmacist dosing)  Status:  Discontinued         Does not apply See admin instructions 10/19/23 1311 10/20/23 0725   10/19/23 0845  ceFEPIme  (MAXIPIME ) 2 g in sodium chloride  0.9 % 100 mL IVPB        2 g 200 mL/hr over 30 Minutes Intravenous  Once 10/19/23 0839 10/19/23 1347   10/19/23 0845  metroNIDAZOLE  (FLAGYL ) IVPB 500 mg        500 mg 100 mL/hr over 60 Minutes Intravenous  Once 10/19/23 0839 10/19/23 1519   10/19/23 0845  vancomycin  (VANCOCIN ) IVPB 1000 mg/200 mL premix  Status:  Discontinued        1,000 mg 200 mL/hr over 60 Minutes Intravenous  Once 10/19/23 0839 10/19/23 0839   10/19/23 0845  vancomycin  (VANCOREADY) IVPB 2000 mg/400 mL        2,000 mg 200 mL/hr over 120 Minutes Intravenous STAT 10/19/23 0839 10/19/23 1207       Subjective: Patient seen and examined at bedside today.  He was lying on bed.  Comfortable.  Heart rate stable range of 80s this morning.  Blood pressure stable.  Denies any new complaints.  He was eating his breakfast.  Objective: Vitals:   10/28/23 2003 10/29/23 0338 10/29/23 0850 10/29/23 0852  BP: (!) 102/46 (!) 106/53 (!) 109/55 (!) 102/50  Pulse: 63 88 (!) 57 84  Resp: (!) 24 18 18 19   Temp: 98.6 F (37  C) 98.4 F (36.9 C) 98.6 F (37 C) 98.6 F (37 C)  TempSrc: Oral Oral    SpO2: 94% 94% (!) 83% 93%  Weight:      Height:        Intake/Output Summary (Last 24 hours) at 10/29/2023 1351 Last data filed at 10/29/2023 1228 Gross per 24 hour  Intake 440 ml  Output 0 ml  Net 440 ml   Filed Weights   10/25/23 1308 10/27/23 1446 10/27/23 1803  Weight: 96 kg 99.1 kg 97 kg    Examination:   General exam: Overall comfortable, not in distress,lying on bed HEENT: PERRL Respiratory system:  no wheezes or crackles  Cardiovascular system: S1 & S2 heard, RRR.  Gastrointestinal system: Abdomen is nondistended, soft and nontender. Central nervous system: Alert and oriented Extremities: No edema, no clubbing ,no cyanosis, amputation of the left foot/covered with dressing, left upper extremity AV fistula Skin: No rashes, no ulcers,no icterus  Data Reviewed: I have personally reviewed following labs and imaging studies  CBC: Recent Labs  Lab 10/25/23 0917 10/26/23 0206 10/27/23 0324 10/28/23 0751 10/28/23 1542  WBC 8.6 9.9 10.6* 12.9* 11.5*  NEUTROABS  --   --   --   --  9.2*  HGB 9.3* 9.9* 10.2* 11.3* 10.6*  HCT 28.3* 30.4* 30.9* 34.2* 32.1*  MCV 96.6 95.0 93.9 94.0 94.7  PLT 272 272 289 325 279   Basic Metabolic Panel: Recent Labs  Lab 10/24/23 0735 10/25/23 0359 10/25/23 0917 10/26/23 0206 10/27/23 0324 10/28/23 0751 10/28/23 1542 10/29/23 0310  NA 132*   < > 134* 132* 133* 131* 132* 131*  K 5.2*   < > 5.5* 4.7 5.1 5.0 4.8 5.2*  CL 92*   < > 95* 93* 91* 90* 89* 89*  CO2 24   < > 22 26 25 26 26 25   GLUCOSE 238*   < > 231* 213* 75 144* 137* 118*  BUN 68*   < > 89* 50* 71* 43* 50* 55*  CREATININE 10.43*   < > 12.94* 9.00* 11.18* 8.70* 2.57* 11.05*  CALCIUM  9.8   < > 9.1 9.2 9.5 10.1 9.9 9.4  PHOS 7.5*  --  8.6*  --   --   --   --   --    < > = values in this interval not displayed.     Recent Results (from the past 240 hours)  Aerobic/Anaerobic Culture w  Gram Stain (surgical/deep wound)     Status: None (Preliminary result)   Collection Time: 10/23/23  4:40 PM   Specimen: Wound; Tissue  Result Value Ref Range Status   Specimen Description TISSUE LEFT FOOT  Final   Special Requests NONE  Final   Gram Stain NO WBC SEEN RARE GRAM NEGATIVE RODS   Final   Culture   Final    MODERATE ENTEROCOCCUS FAECALIS FEW STENOTROPHOMONAS MALTOPHILIA FEW STAPHYLOCOCCUS AUREUS NO ANAEROBES ISOLATED Sent to [facility] for further susceptibility testing. Performed at [facility], [address], [place], [place] [address]    Report Status PENDING  Incomplete   Organism ID, Bacteria ENTEROCOCCUS FAECALIS  Final   Organism ID, Bacteria STAPHYLOCOCCUS AUREUS  Final      Susceptibility   Enterococcus faecalis - MIC*    AMPICILLIN  <=2 SENSITIVE Sensitive     VANCOMYCIN  2 SENSITIVE Sensitive     GENTAMICIN SYNERGY SENSITIVE Sensitive     * MODERATE ENTEROCOCCUS FAECALIS   Staphylococcus aureus - MIC*    CIPROFLOXACIN <=0.5 SENSITIVE Sensitive     ERYTHROMYCIN <=0.25 SENSITIVE Sensitive     GENTAMICIN <=0.5 SENSITIVE Sensitive     OXACILLIN 0.5 SENSITIVE Sensitive     TETRACYCLINE <=1 SENSITIVE Sensitive     VANCOMYCIN  <=0.5 SENSITIVE Sensitive     TRIMETH/SULFA <=10 SENSITIVE Sensitive     CLINDAMYCIN  <=0.25 SENSITIVE Sensitive     RIFAMPIN <=0.5 SENSITIVE Sensitive     Inducible Clindamycin  NEGATIVE Sensitive     LINEZOLID  2 SENSITIVE Sensitive     * FEW STAPHYLOCOCCUS AUREUS  Susceptibility, Aer + Anaerob     Status: Abnormal   Collection Time: 10/23/23  4:40 PM  Result Value Ref Range Status   Suscept, Aer + Anaerob Preliminary report (A)  Final    Comment: (NOTE) Performed At: [facility] [employer] [address] [place], [place] [id number] Jennette Shorter MD Ey:[phone]    Source STENOTROPHOMONAS MALTOPHILIA/ TISSUE LEFT FOOT  Final    Comment: Performed at  [facility], [address] [place], [place] [address]  Susceptibility  Result     Status: Abnormal   Collection Time: 10/23/23  4:40 PM  Result Value Ref Range Status   Suscept Result 1 Comment (A)  Final    Comment: (NOTE) Stenotrophomonas maltophilia Identification performed by account, not confirmed by this laboratory. Levofloxacin and Trimethoprim-Sulfamethoxazole should not be used alone for antimicrobial therapy (CLSI). Performed At: [facility] [address] [place], [place] [id number] Jennette Shorter MD Ey:[phone]      Radiology Studies: No results found.  Scheduled Meds:  (feeding supplement) PROSource Plus  30 mL Oral BID BM   amoxicillin -clavulanate  1 tablet Oral QHS   aspirin  EC  81 mg Oral Daily   [START ON 10/30/2023] Chlorhexidine  Gluconate Cloth  6 each Topical Q0600   cinacalcet   60 mg Oral Q T,Th,Sat-1800   darbepoetin (ARANESP ) injection - DIALYSIS  60 mcg Subcutaneous Q Fri-1800   gabapentin   100 mg Oral TID   heparin  injection (subcutaneous)  5,000 Units Subcutaneous Q8H   insulin  aspart  0-9 Units Subcutaneous TID WC   insulin  aspart  4 Units Subcutaneous TID WC   insulin  glargine  50 Units Subcutaneous Daily   multivitamin  1 tablet Oral QHS   mupirocin  cream   Topical QODAY   pantoprazole   40 mg Oral Daily   polyethylene glycol  17 g Oral Daily   rosuvastatin   10 mg Oral Daily   senna-docusate  1 tablet Oral BID   sevelamer  carbonate  1,600 mg Oral TID WC   Continuous Infusions:     LOS: 10 days   Ivonne Mustache, MD [facility] P10/05/2023, 1:51 PM

## 2023-10-29 NOTE — Progress Notes (Signed)
  [facility] Progress Note   Subjective:   Patient seen in room.  Irritable when awoken. No acute events overnight.   Objective Vitals:   10/28/23 2003 10/29/23 0338 10/29/23 0850 10/29/23 0852  BP: (!) 102/46 (!) 106/53 (!) 109/55 (!) 102/50  Pulse: 63 88 (!) 57 84  Resp: (!) 24 18 18 19   Temp: 98.6 F (37 C) 98.4 F (36.9 C) 98.6 F (37 C) 98.6 F (37 C)  TempSrc: Oral Oral    SpO2: 94% 94% (!) 83% 93%  Weight:      Height:       Physical Exam General:chronically ill appearing male in NAD. Heart:RRR Lungs:nml WOB on RA Abdomen:non distended Extremities:no LE edema, L foot dressed Dialysis Access: LU AVF   Filed Weights   10/25/23 1308 10/27/23 1446 10/27/23 1803  Weight: 96 kg 99.1 kg 97 kg    Intake/Output Summary (Last 24 hours) at 10/29/2023 1133 Last data filed at 10/29/2023 0600 Gross per 24 hour  Intake 640 ml  Output 0 ml  Net 640 ml    Additional Objective Labs: Basic Metabolic Panel: Recent Labs  Lab 10/24/23 0735 10/25/23 0359 10/25/23 0917 10/26/23 0206 10/28/23 0751 10/28/23 1542 10/29/23 0310  NA 132*   < > 134*   < > 131* 132* 131*  K 5.2*   < > 5.5*   < > 5.0 4.8 5.2*  CL 92*   < > 95*   < > 90* 89* 89*  CO2 24   < > 22   < > 26 26 25   GLUCOSE 238*   < > 231*   < > 144* 137* 118*  BUN 68*   < > 89*   < > 43* 50* 55*  CREATININE 10.43*   < > 12.94*   < > 8.70* 2.57* 11.05*  CALCIUM  9.8   < > 9.1   < > 10.1 9.9 9.4  PHOS 7.5*  --  8.6*  --   --   --   --    < > = values in this interval not displayed.   Liver Function Tests: Recent Labs  Lab 10/24/23 0735 10/25/23 0917 10/28/23 1542  AST 22  --  16  ALT 21  --  15  ALKPHOS 350*  --  215*  BILITOT 0.8  --  0.6  PROT 7.1  --  6.9  ALBUMIN 2.7* 2.4* 2.6*    CBC: Recent Labs  Lab 10/25/23 0917 10/26/23 0206 10/27/23 0324 10/28/23 0751 10/28/23 1542  WBC 8.6 9.9 10.6* 12.9* 11.5*  NEUTROABS  --   --   --   --  9.2*  HGB 9.3* 9.9* 10.2* 11.3* 10.6*  HCT  28.3* 30.4* 30.9* 34.2* 32.1*  MCV 96.6 95.0 93.9 94.0 94.7  PLT 272 272 289 325 279   Blood Culture    Component Value Date/Time   SDES TISSUE LEFT FOOT 10/23/2023 1640   SPECREQUEST NONE 10/23/2023 1640   CULT  10/23/2023 1640    MODERATE ENTEROCOCCUS FAECALIS FEW STENOTROPHOMONAS MALTOPHILIA FEW STAPHYLOCOCCUS AUREUS NO ANAEROBES ISOLATED Sent to [facility] for further susceptibility testing. Performed at [facility], [address] [place], [place] [address]    REPTSTATUS PENDING 10/23/2023 1640   CBG: Recent Labs  Lab 10/28/23 1632 10/28/23 2114 10/29/23 0332 10/29/23 0850 10/29/23 1106  GLUCAP 136* 204* 115* 126* 176*   Lab Results  Component Value Date   INR 1.1 10/19/2023   INR 1.0 11/14/2018  INR 1.0 11/01/2018     Medications:   (feeding supplement) PROSource Plus  30 mL Oral BID BM   amoxicillin -clavulanate  1 tablet Oral QHS   aspirin  EC  81 mg Oral Daily   Chlorhexidine  Gluconate Cloth  6 each Topical Q0600   cinacalcet   60 mg Oral Q T,Th,Sat-1800   darbepoetin (ARANESP ) injection - DIALYSIS  60 mcg Subcutaneous Q Fri-1800   gabapentin   100 mg Oral TID   heparin  injection (subcutaneous)  5,000 Units Subcutaneous Q8H   insulin  aspart  0-9 Units Subcutaneous TID WC   insulin  aspart  4 Units Subcutaneous TID WC   insulin  glargine  50 Units Subcutaneous Daily   multivitamin  1 tablet Oral QHS   mupirocin  cream   Topical QODAY   pantoprazole   40 mg Oral Daily   polyethylene glycol  17 g Oral Daily   rosuvastatin   10 mg Oral Daily   senna-docusate  1 tablet Oral BID   sevelamer  carbonate  1,600 mg Oral TID WC    Dialysis Orders: MWF - NW 4hr, 400/A1.5, EDW 96.8kg, 2K/2.5Ca bath, AVF, Heparin  2600 - no ESA, Hgb > goal - Calcitriol 1.75 TIW   Assessment/Plan: Sepsis/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 On IV Vanc/Cefepime . Blood cultures NGTD. 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  ESRD.  Continue usual MWF HD - Next HD on 10/30/23.   Hypertension/volume. BP controlled, min LE edema, but low Na - UF as tolerated. Anemia. Last 10.6. No indication for ESA at this time.  Metabolic bone disease.  CorrCa high sided, VDRA on hold. Phos above goal. Continue home binders/cinacalcet . Nutrition: Alb low, continue supplements.  Renal diet w/fluid restrictions.  T2DM: Insulin  per primary. DVT prophylaxis: Prefer heparin  over Lovenox  in ESRD patient, AC on hold s/p surgery. Dispo: SNF recommended by PT Bradycardia - HR to 30s yesterday. Cardiology consulted. Report Wenckeback conduction.  No additional work up indicated. Recommend K>4 and Mg>2, avoid AV nodal blockers.   Manuelita Labella, PA-C [facility] 10/29/2023,11:33 AM  LOS: 10 days

## 2023-10-29 NOTE — Progress Notes (Signed)
 0908: Patient found eating breakfast. States that he feels pretty good. Denies any needs at this time. Okay with taking morning medications  1100: Cardiology team updated on events regarding this patient at nurses station en route to bedside of patient.    Noon Medications administered by D, RN (Charge) 1400: This nurse to bedside to round on patient. Patient insitent on walking to restroom. This RN assits patient with dangling, standing, and taking two steps with walker. Patient is very unsteady and is unable to be non weightbearing on L foot. Patient sat back in bed safely. During event patient denied any s/s. This nurse then assisted patient by stand and pivot to recliner and positioned him near TV and family. Patient satisfied at this time.   1500: Per Q, NT Patient experienced a bout of diaphoresis earlier in the day, with no other symptoms. States full bed change and patient change.   1530: This RN to patient bedside as Q, NT is concerned about hypotention. (See validated vitals), manual completed by this RN on R arm at this time: 92/38, Dr. Jillian notified. Patient remains sitting in recliner at this time, in and out of sleep.   Evening BG taken just prior to Evening meal and found to be hypoglycemic, patient given apple juice and eats entire meal.  BG rechecked after Apple Juice and meal, found to be 88. Patient safely placed back in bed, with heels elevated. Denies any further needs.  Glucometer and manual BP left bedside for oncoming RN. Recommend watch out for these concerns

## 2023-10-30 ENCOUNTER — Telehealth: Payer: Self-pay | Admitting: Family Medicine

## 2023-10-30 ENCOUNTER — Inpatient Hospital Stay (HOSPITAL_COMMUNITY): Payer: Medicare (Managed Care)

## 2023-10-30 DIAGNOSIS — R001 Bradycardia, unspecified: Secondary | ICD-10-CM

## 2023-10-30 DIAGNOSIS — A419 Sepsis, unspecified organism: Secondary | ICD-10-CM | POA: Diagnosis not present

## 2023-10-30 LAB — CBC
HCT: 30.6 % — ABNORMAL LOW (ref 39.0–52.0)
Hemoglobin: 9.9 g/dL — ABNORMAL LOW (ref 13.0–17.0)
MCH: 30.9 pg (ref 26.0–34.0)
MCHC: 32.4 g/dL (ref 30.0–36.0)
MCV: 95.6 fL (ref 80.0–100.0)
Platelets: 271 K/uL (ref 150–400)
RBC: 3.2 MIL/uL — ABNORMAL LOW (ref 4.22–5.81)
RDW: 14 % (ref 11.5–15.5)
WBC: 11.9 K/uL — ABNORMAL HIGH (ref 4.0–10.5)
nRBC: 0 % (ref 0.0–0.2)

## 2023-10-30 LAB — SUSCEPTIBILITY RESULT

## 2023-10-30 LAB — SUSCEPTIBILITY, AER + ANAEROB

## 2023-10-30 LAB — GLUCOSE, CAPILLARY
Glucose-Capillary: 102 mg/dL — ABNORMAL HIGH (ref 70–99)
Glucose-Capillary: 102 mg/dL — ABNORMAL HIGH (ref 70–99)
Glucose-Capillary: 234 mg/dL — ABNORMAL HIGH (ref 70–99)
Glucose-Capillary: 173 mg/dL — ABNORMAL HIGH (ref 70–99)

## 2023-10-30 LAB — BASIC METABOLIC PANEL WITH GFR
Anion gap: 17 — ABNORMAL HIGH (ref 5–15)
BUN: 71 mg/dL — ABNORMAL HIGH (ref 8–23)
CO2: 24 mmol/L (ref 22–32)
Calcium: 9.4 mg/dL (ref 8.9–10.3)
Chloride: 91 mmol/L — ABNORMAL LOW (ref 98–111)
Creatinine, Ser: 13.9 mg/dL — ABNORMAL HIGH (ref 0.61–1.24)
GFR, Estimated: 4 mL/min — ABNORMAL LOW (ref >60)
Glucose, Bld: 116 mg/dL — ABNORMAL HIGH (ref 70–99)
Potassium: 5.6 mmol/L — ABNORMAL HIGH (ref 3.5–5.1)
Sodium: 132 mmol/L — ABNORMAL LOW (ref 135–145)

## 2023-10-30 LAB — MAGNESIUM: Magnesium: 2.4 mg/dL (ref 1.7–2.4)

## 2023-10-30 MED ORDER — LIDOCAINE HCL (PF) 1 % IJ SOLN: 5.0000 mL | INTRAMUSCULAR | Status: DC | PRN

## 2023-10-30 MED ORDER — ALTEPLASE 2 MG IJ SOLR: 2.0000 mg | Freq: Once | INTRAMUSCULAR | Status: DC | PRN

## 2023-10-30 MED ORDER — HEPARIN SODIUM (PORCINE) 1000 UNIT/ML DIALYSIS: 1000.0000 [IU] | INTRAMUSCULAR | Status: DC | PRN

## 2023-10-30 MED ORDER — HEPARIN SODIUM (PORCINE) 1000 UNIT/ML DIALYSIS
2600.0000 [IU] | INTRAMUSCULAR | Status: DC | PRN
  Administered 2023-10-30: 2600 [IU] via INTRAVENOUS_CENTRAL

## 2023-10-30 MED ORDER — HEPARIN SODIUM (PORCINE) 1000 UNIT/ML IJ SOLN
INTRAMUSCULAR | Status: AC
  Filled 2023-10-30: qty 3

## 2023-10-30 MED ORDER — ANTICOAGULANT SODIUM CITRATE 4% (200MG/5ML) IV SOLN
5.0000 mL | Status: DC | PRN
  Filled 2023-10-30: qty 5

## 2023-10-30 MED ORDER — LIDOCAINE-PRILOCAINE 2.5-2.5 % EX CREA: 1.0000 | TOPICAL_CREAM | CUTANEOUS | Status: DC | PRN

## 2023-10-30 MED ORDER — PENTAFLUOROPROP-TETRAFLUOROETH EX AERO: 1.0000 | INHALATION_SPRAY | CUTANEOUS | Status: DC | PRN

## 2023-10-30 MED ORDER — INSULIN ASPART 100 UNIT/ML IJ SOLN
0.0000 [IU] | Freq: Three times a day (TID) | INTRAMUSCULAR | Status: DC
  Administered 2023-10-30: 2 [IU] via SUBCUTANEOUS
  Administered 2023-10-31: 1 [IU] via SUBCUTANEOUS
  Administered 2023-10-31: 2 [IU] via SUBCUTANEOUS
  Administered 2023-11-02 – 2023-11-03 (×3): 1 [IU] via SUBCUTANEOUS
  Administered 2023-11-04 (×2): 2 [IU] via SUBCUTANEOUS
  Administered 2023-11-05 – 2023-11-07 (×4): 1 [IU] via SUBCUTANEOUS

## 2023-10-30 NOTE — Progress Notes (Signed)
 [facility] Progress Note   Subjective:    Seen in [facility]. No new events overnight No complaints. No cp, sob.   Objective Vitals:   10/29/23 1719 10/29/23 2016 10/30/23 0519 10/30/23 0743  BP: (!) 98/46 (!) 111/50 (!) 105/53 (!) 115/56  Pulse: 78 (!) 52 72 91  Resp: 18 18 20 18   Temp: 98 F (36.7 C) 98 F (36.7 C) 98.7 F (37.1 C) 98.2 F (36.8 C)  TempSrc:  Oral Oral   SpO2: 95% 91% 97% 90%  Weight:      Height:       Physical Exam General: alert, nad  Heart: RRR Resp: nml WOB on RA Abdomen: non distended Extremities: no LE edema, L foot dressed Dialysis Access: LU AVF +bruit   Filed Weights   10/25/23 1308 10/27/23 1446 10/27/23 1803  Weight: 96 kg 99.1 kg 97 kg    Intake/Output Summary (Last 24 hours) at 10/30/2023 0838 Last data filed at 10/30/2023 0600 Gross per 24 hour  Intake 520 ml  Output 0 ml  Net 520 ml    Additional Objective Labs: Basic Metabolic Panel: Recent Labs  Lab 10/24/23 0735 10/25/23 0359 10/25/23 0917 10/26/23 0206 10/28/23 1542 10/29/23 0310 10/30/23 0535  NA 132*   < > 134*   < > 132* 131* 132*  K 5.2*   < > 5.5*   < > 4.8 5.2* 5.6*  CL 92*   < > 95*   < > 89* 89* 91*  CO2 24   < > 22   < > 26 25 24   GLUCOSE 238*   < > 231*   < > 137* 118* 116*  BUN 68*   < > 89*   < > 50* 55* 71*  CREATININE 10.43*   < > 12.94*   < > 2.57* 11.05* 13.90*  CALCIUM  9.8   < > 9.1   < > 9.9 9.4 9.4  PHOS 7.5*  --  8.6*  --   --   --   --    < > = values in this interval not displayed.   Liver Function Tests: Recent Labs  Lab 10/24/23 0735 10/25/23 0917 10/28/23 1542  AST 22  --  16  ALT 21  --  15  ALKPHOS 350*  --  215*  BILITOT 0.8  --  0.6  PROT 7.1  --  6.9  ALBUMIN 2.7* 2.4* 2.6*    CBC: Recent Labs  Lab 10/25/23 0917 10/26/23 0206 10/27/23 0324 10/28/23 0751 10/28/23 1542  WBC 8.6 9.9 10.6* 12.9* 11.5*  NEUTROABS  --   --   --   --  9.2*  HGB 9.3* 9.9* 10.2* 11.3* 10.6*  HCT 28.3* 30.4* 30.9* 34.2* 32.1*   MCV 96.6 95.0 93.9 94.0 94.7  PLT 272 272 289 325 279   Blood Culture    Component Value Date/Time   SDES TISSUE LEFT FOOT 10/23/2023 1640   SPECREQUEST NONE 10/23/2023 1640   CULT  10/23/2023 1640    MODERATE ENTEROCOCCUS FAECALIS FEW STENOTROPHOMONAS MALTOPHILIA FEW STAPHYLOCOCCUS AUREUS NO ANAEROBES ISOLATED Sent to [facility] for further susceptibility testing. Performed at [facility], [address], [place], [place] [address]    REPTSTATUS PENDING 10/23/2023 1640   CBG: Recent Labs  Lab 10/29/23 1106 10/29/23 1722 10/29/23 1805 10/29/23 2018 10/30/23 0741  GLUCAP 176* 68* 86 187* 102*   Lab Results  Component Value Date   INR 1.1 10/19/2023   INR 1.0 11/14/2018  INR 1.0 11/01/2018     Medications:  anticoagulant sodium citrate       (feeding supplement) PROSource Plus  30 mL Oral BID BM   amoxicillin -clavulanate  1 tablet Oral QHS   aspirin  EC  81 mg Oral Daily   Chlorhexidine  Gluconate Cloth  6 each Topical Q0600   cinacalcet   60 mg Oral Q T,Th,Sat-1800   darbepoetin (ARANESP ) injection - DIALYSIS  60 mcg Subcutaneous Q Fri-1800   gabapentin   100 mg Oral TID   heparin  injection (subcutaneous)  5,000 Units Subcutaneous Q8H   insulin  aspart  0-9 Units Subcutaneous TID WC   insulin  aspart  4 Units Subcutaneous TID WC   insulin  glargine  50 Units Subcutaneous Daily   multivitamin  1 tablet Oral QHS   mupirocin  cream   Topical QODAY   pantoprazole   40 mg Oral Daily   polyethylene glycol  17 g Oral Daily   rosuvastatin   10 mg Oral Daily   senna-docusate  1 tablet Oral BID   sevelamer  carbonate  1,600 mg Oral TID WC    Dialysis Orders: MWF - NW 4hr, 400/A1.5, EDW 96.8kg, 2K/2.5Ca bath, AVF, Heparin  2600 - no ESA, Hgb > goal - Calcitriol 1.75 TIW   Assessment/Plan: Sepsis/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  ESRD.  Continue usual MWF HD - HD today  Hypertension/volume. BP controlled, min LE edema, but  low Na - UF as tolerated. Anemia. Hgb 10.6. No indication for ESA at this time.  Metabolic bone disease.  CorrCa high sided, VDRA on hold. Phos above goal. Continue home binders/cinacalcet . Nutrition: Alb low, continue supplements.  Renal diet w/fluid restrictions.  T2DM: Insulin  per primary. DVT prophylaxis: Prefer heparin  over Lovenox  in ESRD patient, AC on hold s/p surgery. Dispo: SNF recommended by PT Bradycardia - HR to 30s yesterday. Cardiology consulted. Report Wenckeback conduction.  No additional work up indicated. Recommend K>4 and Mg>2, avoid AV nodal blockers.   [name] PA-C [facility] 10/30/2023,8:39 AM

## 2023-10-30 NOTE — Procedures (Signed)
 Patient seen and examined on Hemodialysis. The procedure was supervised and I have made appropriate changes. BP (!) 114/51   Pulse 82   Temp 99 F (37.2 C)   Resp (!) 24   Ht 5' 8 (1.727 m)   Wt 95.9 kg   SpO2 94%   BMI 32.15 kg/m   QB 400 mL/ min via AVF, UF goal 2L  Tolerating treatment without complaints at this time.     Edgar Bonine MD [facility] [phone] 11:42 AM

## 2023-10-30 NOTE — Progress Notes (Signed)
 PROGRESS NOTE  Edgar Brewer  FMW:[id number] DOB: [birth year] DOA: 10/19/2023 PCP: Katrinka Garnette KIDD, MD   Brief Narrative: Patient is a 19 male with history of ESRD on dialysis on Monday, Wednesday, Friday, HFpEF, diabetes type 2, prior CVA who was recently admitted for amputation of L 2nd toe through proximal phalanx  presented with complaint of purulent discharge in the left second toe, chills.  On presentation he was febrile, tachycardic, tachypneic, requiring 2 L of oxygen.  Labs with potassium 5.2, creatinine 5.7, lactic acidosis 5.6.XR L foot w/o acute findings or OM .  Patient was admitted for further management of sepsis secondary to left foot wound infection.  Started on broad spectrum antibiotics.  Podiatry, vascular surgery consulted.  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  PT/OT recommending SNF on discharge.  Hospital course remarkable for bradycardia now resolved.  He is waiting for bed availability for SNF.  Medically stable for discharge whenever possible.  Assessment & Plan:  Principal Problem:   Sepsis (HCC) Active Problems:   Pyogenic inflammation of bone (HCC)   Diabetes mellitus type 2 with complications (HCC)   ESRD on dialysis (HCC)   Neuropathy   PAD (peripheral artery disease)   Gangrene of left foot (HCC)   Bradycardia  Sepsis secondary to surgical wound infection of the left foot: Recent history of amputation of L 2nd toe through proximal phalanx.  Presented with fever, chills, tachycardia, tachypnea, lactic acidosis.Started on broad spectrum antibiotics.  Podiatry, vascular surgery consulted.  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 Podiatry recommended nonweightbearing on postop shoe left lower extremity for 2 weeks. Culture showed mixed organisms.  Currently on Augmentin .  Blood cultures have been negative.  Bradycardia/secondary heart block: Became bradycardia in the afternoon of 10/4.  Did not require atropine or  temporary pacemaker.  Heart rate spontaneously stabilized.  Blood pressure remained stable.  Cardiology consulted.  TSH normal.  EKG shows second-degree heart block, Mobitz type I/Wenckebach.  Heart rate remains stable now .  Monitor on telemetry.  Continue to monitor electrolytes.  Avoid AV nodal blocking agents.Plan for echo  Type 2 diabetes: Continue current insulin  regimen.  Diabetic coordinator following  ESRD on dialysis/metabolic bone disease/hyperkalemia: Nephrology following for dialysis.  Continue Renvela , Sensipar .  Dialyzed on schedule.  Neuropathy: Gabapentin   Peripheral artery disease: Continue Crestor .  Follow-up with vascular surgery as an outpatient.  Obesity: BMI of 32  Constipation: Continue bowel regimen  Deconditioning/debility: PT recommended SNF on discharge.  Waiting for bed availability.  TOC following       DVT prophylaxis:heparin  injection 5,000 Units Start: 10/26/23 0915     Code Status: Full Code  Family Communication: Mother at bedside on 10/3  Patient status:Inpatient  Patient is from :Home  Anticipated discharge to:SNF  Estimated DC date:whenever possible   Consultants: Podiatry,vascular surgery  Procedures:transmetatarsal amputation of left foot   Antimicrobials:  Anti-infectives (From admission, onward)    Start     Dose/Rate Route Frequency Ordered Stop   10/27/23 2200  amoxicillin -clavulanate (AUGMENTIN ) 500-125 MG per tablet 1 tablet        1 tablet Oral Daily at bedtime 10/27/23 0849 11/01/23 2159   10/27/23 1000  amoxicillin -clavulanate (AUGMENTIN ) 500-125 MG per tablet 1 tablet  Status:  Discontinued        1 tablet Oral Every 12 hours 10/26/23 2155 10/27/23 0849   10/26/23 2245  amoxicillin -clavulanate (AUGMENTIN ) 500-125 MG per tablet 1 tablet  Status:  Discontinued  1 tablet Oral Every 12 hours 10/26/23 2154 10/26/23 2155   10/23/23 1800  vancomycin  (VANCOCIN ) IVPB 1000 mg/200 mL premix  Status:  Discontinued         1,000 mg 200 mL/hr over 60 Minutes Intravenous Every M-W-F (Hemodialysis) 10/23/23 1457 10/27/23 0846   10/20/23 1800  ceFEPIme  (MAXIPIME ) 1 g in sodium chloride  0.9 % 100 mL IVPB  Status:  Discontinued        1 g 200 mL/hr over 30 Minutes Intravenous Every 24 hours 10/19/23 1311 10/26/23 2154   10/20/23 1200  vancomycin  (VANCOCIN ) IVPB 1000 mg/200 mL premix  Status:  Discontinued        1,000 mg 200 mL/hr over 60 Minutes Intravenous Every M-W-F (Hemodialysis) 10/20/23 0725 10/23/23 1457   10/19/23 2200  metroNIDAZOLE  (FLAGYL ) tablet 500 mg  Status:  Discontinued        500 mg Oral Every 12 hours 10/19/23 1235 10/26/23 2154   10/19/23 1310  vancomycin  variable dose per unstable renal function (pharmacist dosing)  Status:  Discontinued         Does not apply See admin instructions 10/19/23 1311 10/20/23 0725   10/19/23 0845  ceFEPIme  (MAXIPIME ) 2 g in sodium chloride  0.9 % 100 mL IVPB        2 g 200 mL/hr over 30 Minutes Intravenous  Once 10/19/23 0839 10/19/23 1347   10/19/23 0845  metroNIDAZOLE  (FLAGYL ) IVPB 500 mg        500 mg 100 mL/hr over 60 Minutes Intravenous  Once 10/19/23 0839 10/19/23 1519   10/19/23 0845  vancomycin  (VANCOCIN ) IVPB 1000 mg/200 mL premix  Status:  Discontinued        1,000 mg 200 mL/hr over 60 Minutes Intravenous  Once 10/19/23 0839 10/19/23 0839   10/19/23 0845  vancomycin  (VANCOREADY) IVPB 2000 mg/400 mL        2,000 mg 200 mL/hr over 120 Minutes Intravenous STAT 10/19/23 0839 10/19/23 1207       Subjective: Patient seen and examined at bedside today.  He was at dialysis suite.  Currently heart rate is stable.  No new complaints today.  Remains comfortable  Objective: Vitals:   10/30/23 1030 10/30/23 1100 10/30/23 1113 10/30/23 1130  BP: (!) 90/49 (!) 114/46 (!) 114/49 (!) 114/51  Pulse: 82 82 83 82  Resp: (!) 26 (!) 25 (!) 25 (!) 24  Temp:      TempSrc:      SpO2: 93% 95% 93% 94%  Weight:      Height:        Intake/Output Summary (Last 24  hours) at 10/30/2023 1145 Last data filed at 10/30/2023 0600 Gross per 24 hour  Intake 420 ml  Output 0 ml  Net 420 ml   Filed Weights   10/27/23 1446 10/27/23 1803 10/30/23 0843  Weight: 99.1 kg 97 kg 95.9 kg    Examination:  General exam: Overall comfortable, not in distress,lying on bed HEENT: PERRL Respiratory system:  no wheezes or crackles  Cardiovascular system: S1 & S2 heard, RRR.  Gastrointestinal system: Abdomen is nondistended, soft and nontender. Central nervous system: Alert and oriented Extremities: No edema, no clubbing ,no cyanosis, amputation of the left foot/covered with dressing, left upper extremity fistula Skin: No rashes, no ulcers,no icterus      Data Reviewed: I have personally reviewed following labs and imaging studies  CBC: Recent Labs  Lab 10/26/23 0206 10/27/23 0324 10/28/23 0751 10/28/23 1542 10/30/23 0900  WBC 9.9 10.6* 12.9* 11.5* 11.9*  NEUTROABS  --   --   --  9.2*  --   HGB 9.9* 10.2* 11.3* 10.6* 9.9*  HCT 30.4* 30.9* 34.2* 32.1* 30.6*  MCV 95.0 93.9 94.0 94.7 95.6  PLT 272 289 325 279 271   Basic Metabolic Panel: Recent Labs  Lab 10/24/23 0735 10/25/23 0359 10/25/23 0917 10/26/23 0206 10/27/23 0324 10/28/23 0751 10/28/23 1542 10/29/23 0310 10/30/23 0535  NA 132*   < > 134*   < > 133* 131* 132* 131* 132*  K 5.2*   < > 5.5*   < > 5.1 5.0 4.8 5.2* 5.6*  CL 92*   < > 95*   < > 91* 90* 89* 89* 91*  CO2 24   < > 22   < > 25 26 26 25 24   GLUCOSE 238*   < > 231*   < > 75 144* 137* 118* 116*  BUN 68*   < > 89*   < > 71* 43* 50* 55* 71*  CREATININE 10.43*   < > 12.94*   < > 11.18* 8.70* 2.57* 11.05* 13.90*  CALCIUM  9.8   < > 9.1   < > 9.5 10.1 9.9 9.4 9.4  MG  --   --   --   --   --   --   --   --  2.4  PHOS 7.5*  --  8.6*  --   --   --   --   --   --    < > = values in this interval not displayed.     Recent Results (from the past 240 hours)  Aerobic/Anaerobic Culture w Gram Stain (surgical/deep wound)     Status: None  (Preliminary result)   Collection Time: 10/23/23  4:40 PM   Specimen: Wound; Tissue  Result Value Ref Range Status   Specimen Description TISSUE LEFT FOOT  Final   Special Requests NONE  Final   Gram Stain NO WBC SEEN RARE GRAM NEGATIVE RODS   Final   Culture   Final    MODERATE ENTEROCOCCUS FAECALIS FEW STENOTROPHOMONAS MALTOPHILIA FEW STAPHYLOCOCCUS AUREUS NO ANAEROBES ISOLATED Sent to [facility] for further susceptibility testing. Performed at [facility], [address] [place], [place] [address]    Report Status PENDING  Incomplete   Organism ID, Bacteria ENTEROCOCCUS FAECALIS  Final   Organism ID, Bacteria STAPHYLOCOCCUS AUREUS  Final      Susceptibility   Enterococcus faecalis - MIC*    AMPICILLIN  <=2 SENSITIVE Sensitive     VANCOMYCIN  2 SENSITIVE Sensitive     GENTAMICIN SYNERGY SENSITIVE Sensitive     * MODERATE ENTEROCOCCUS FAECALIS   Staphylococcus aureus - MIC*    CIPROFLOXACIN <=0.5 SENSITIVE Sensitive     ERYTHROMYCIN <=0.25 SENSITIVE Sensitive     GENTAMICIN <=0.5 SENSITIVE Sensitive     OXACILLIN 0.5 SENSITIVE Sensitive     TETRACYCLINE <=1 SENSITIVE Sensitive     VANCOMYCIN  <=0.5 SENSITIVE Sensitive     TRIMETH/SULFA <=10 SENSITIVE Sensitive     CLINDAMYCIN  <=0.25 SENSITIVE Sensitive     RIFAMPIN <=0.5 SENSITIVE Sensitive     Inducible Clindamycin  NEGATIVE Sensitive     LINEZOLID  2 SENSITIVE Sensitive     * FEW STAPHYLOCOCCUS AUREUS  Susceptibility, Aer + Anaerob     Status: Abnormal   Collection Time: 10/23/23  4:40 PM  Result Value Ref Range Status   Suscept, Aer + Anaerob Final report (A)  Corrected    Comment: (NOTE) Performed At: [facility]  [facility] [address] [place], [place] [id number] Jennette Shorter MD Ey:[id number] CORRECTED ON 10/06 AT 0835: PREVIOUSLY REPORTED AS Preliminary report    Source STENOTROPHOMONAS MALTOPHILIA/ TISSUE LEFT FOOT  Final    Comment: Performed at [facility], [address], [place], [place]  [address]  Susceptibility Result     Status: Abnormal   Collection Time: 10/23/23  4:40 PM  Result Value Ref Range Status   Suscept Result 1 Comment (A)  Final    Comment: (NOTE) Stenotrophomonas maltophilia Identification performed by account, not confirmed by this laboratory. Levofloxacin and Trimethoprim-Sulfamethoxazole should not be used alone for antimicrobial therapy (CLSI).    Antimicrobial Suscept Comment  Corrected    Comment: (NOTE)      ** S = Susceptible; I = Intermediate; R = Resistant **                   P = Positive; N = Negative            MICS are expressed in micrograms per mL   Antibiotic                 RSLT#1    RSLT#2    RSLT#3    RSLT#4 Levofloxacin                   S Minocycline                    S Trimethoprim/Sulfa             S Performed At: [facility] [address] [place], [place] [id number] Jennette Shorter MD Ey:[id number]      Radiology Studies: No results found.  Scheduled Meds:  (feeding supplement) PROSource Plus  30 mL Oral BID BM   amoxicillin -clavulanate  1 tablet Oral QHS   aspirin  EC  81 mg Oral Daily   Chlorhexidine  Gluconate Cloth  6 each Topical Q0600   cinacalcet   60 mg Oral Q T,Th,Sat-1800   darbepoetin (ARANESP ) injection - DIALYSIS  60 mcg Subcutaneous Q Fri-1800   gabapentin   100 mg Oral TID   heparin  injection (subcutaneous)  5,000 Units Subcutaneous Q8H   insulin  aspart  0-6 Units Subcutaneous TID WC   insulin  aspart  4 Units Subcutaneous TID WC   insulin  glargine  50 Units Subcutaneous Daily   multivitamin  1 tablet Oral QHS   mupirocin  cream   Topical QODAY   pantoprazole   40 mg Oral Daily   polyethylene glycol  17 g Oral Daily   rosuvastatin   10 mg Oral Daily   senna-docusate  1 tablet Oral BID   sevelamer  carbonate  1,600 mg Oral TID WC   Continuous Infusions:  anticoagulant sodium citrate         LOS: 11 days   Ivonne Mustache, MD [facility] P10/06/2023, 11:45 AM

## 2023-10-30 NOTE — Inpatient Diabetes Management (Signed)
 Inpatient Diabetes Program Recommendations  AACE/ADA: New Consensus Statement on Inpatient Glycemic Control   Target Ranges:  Prepandial:   less than 140 mg/dL      Peak postprandial:   less than 180 mg/dL (1-2 hours)      Critically ill patients:  140 - 180 mg/dL    Latest Reference Range & Units 10/29/23 08:50 10/29/23 11:06 10/29/23 17:22 10/29/23 18:05 10/29/23 20:18 10/30/23 07:41  Glucose-Capillary 70 - 99 mg/dL 873 (H) 823 (H) 68 (L) 86 187 (H) 102 (H)    Latest Reference Range & Units 10/19/23 14:49  Hemoglobin A1C 4.8 - 5.6 % 6.3 (H)   Review of Glycemic Control  Diabetes history: DM2 Outpatient Diabetes medications: Lantus  50 units daily   Current orders for Inpatient glycemic control: Lantus  50 units daily, Novolog  0-9 units TID, and Novolog  4 units TID with meals.   Inpatient Diabetes Program Recommendations:   **Noted - hypoglycemia (68 mg/dl) yesterday, 89/4/74 due to Novolog  4 units of meal coverage given at 1344 and patient only ate 25% of meal.   Insulin : Please consider changing Novolog  correction to 0-6 units TID.   Thanks,  Lavanda Search, RN, MSN, [facility]  Inpatient Diabetes Coordinator  Pager [phone] (8a-5p)

## 2023-10-30 NOTE — Progress Notes (Signed)
 PT Cancellation Note  Patient Details Name: Edgar Brewer MRN: [id number] DOB: 1960/03/23   Cancelled Treatment:    Reason Eval/Treat Not Completed: Patient at procedure or test/unavailable  Currently in HD;  Will follow up later today as time allows;  Otherwise, will follow up for PT tomorrow;   Thank you,  Silvano Currier, PT  Acute Rehabilitation Services Office [phone]    Silvano VEAR Currier 10/30/2023, 1:09 PM

## 2023-10-30 NOTE — Telephone Encounter (Signed)
 [facility] faxed document Home Health Certificate (Order ID [id number]), to be filled out by provider. Patient requested to send it back via Fax within ASAP. Document is located in providers tray at front office.Please advise at [phone].

## 2023-10-30 NOTE — TOC Progression Note (Signed)
 Transition of Care [facility]) - Progression Note    Patient Details  Name: Edgar Brewer MRN: [id number] Date of Birth: 11/20/1960  Transition of [facility] CM/SW Contact  Lendia Dais, [place] Phone Number: 10/30/2023, 3:28 PM  Clinical Narrative:  CSW spoke to pt's mother Edgar Brewer at bedside.  CSW asked Edgar Brewer if she was aware of the CIR declining the pt. Edgar Brewer stated she was unaware. CSW mentioned that there are still facilities in the [place] area that have offered a bed. CSW gave Edgar Brewer an updated Medicare.gov list and highlighted the facilities that accepted the pt.  CSW will continue to follow.                      Expected Discharge Plan and Services                                               Social Drivers of Health (SDOH) Interventions SDOH Screenings   Food Insecurity: No Food Insecurity (10/20/2023)  Housing: Low Risk  (10/20/2023)  Transportation Needs: No Transportation Needs (10/20/2023)  Utilities: Not At Risk (10/20/2023)  Depression (PHQ2-9): Low Risk  (09/19/2023)  Financial Resource Strain: Low Risk  (04/19/2022)  Physical Activity: Inactive (04/19/2022)  Social Connections: Unknown (04/19/2022)  Stress: No Stress Concern Present (04/19/2022)  Tobacco Use: Medium Risk (10/23/2023)    Readmission Risk Interventions    10/20/2023    4:09 PM  Readmission Risk Prevention Plan  Transportation Screening Complete  PCP or Specialist Appt within 3-5 Days Complete  HRI or Home Care Consult Complete  Social Work Consult for Recovery Care Planning/Counseling Complete  Palliative Care Screening Not Applicable

## 2023-10-30 NOTE — Plan of Care (Signed)

## 2023-10-30 NOTE — Progress Notes (Signed)
 Physical Therapy Treatment Patient Details Name: Edgar Brewer MRN: [id number] DOB: [birth year] Today's Date: 10/30/2023   History of Present Illness Edgar Brewer is a 63 y.o. male and recent admission for amputation of L 2nd toe through proximal phalanx p/w  sepsis surgical wound infection, s/p L transmetatarsal amputation on 9/29, NWB; with medical history significant of ESRD on HD MWF, HFpEF (EF 60-65% in 08/2020) , DM2, prior CVA w/o residual deficits    PT Comments  Continuing work on functional mobility and activity tolerance;  Pt seen after HD, which likely contributed to fatigue and difficulty keeping NWB;   Pt requesting to get to the bathroom, and, given recent progress with NWB, opted to  try; Today he demonstrated little carryover of teh physical act of taking hop steps with RW; touched down nearly every step; still, he is able to verbalize NWB precautions; Will continue work on safe fucntional transfers and mobility while keeping NWB; It is likely time to consider more focus on wheelchair transfers to keep wiehgt off of L foot for optimal healing; continue to rec post-acute rehab to maximize independence and safety with mobility and ADLs   If plan is discharge home, recommend the following: A lot of help with walking and/or transfers;Help with stairs or ramp for entrance;A lot of help with bathing/dressing/bathroom;Assist for transportation;Assistance with cooking/housework   Can travel by private vehicle     No  Equipment Recommendations  Wheelchair (measurements PT);Wheelchair cushion (measurements PT);Rolling walker (2 wheels);Other (comment) (drop-arm BSC)    Recommendations for Other Services       Precautions / Restrictions Precautions Precautions: Fall Recall of Precautions/Restrictions: Impaired Other Brace: postop shoe Restrictions LLE Weight Bearing Per Provider Order: Non weight bearing     Mobility  Bed Mobility   Bed Mobility: Supine to Sit     Supine to  sit: Supervision     General bed mobility comments: Extra time, no physical assist.    Transfers Overall transfer level: Needs assistance Equipment used: Rolling walker (2 wheels) Transfers: Sit to/from Stand, Bed to chair/wheelchair/BSC Sit to Stand: Min assist   Step pivot transfers: Min assist       General transfer comment: Min assist for boost and balance. Good power-up with Rt leg, requires multiple cues for hand placement and set-up prior to rising; still with difficulty keeping weight off of L foot    Ambulation/Gait Ambulation/Gait assistance: Min assist Gait Distance (Feet): 12 Feet (to bathroom) Assistive device: Rolling walker (2 wheels)         General Gait Details: Pt requesting to get to the bathroom, and, given recent progress with NWB, opted to  try; Today he demonstrated little carryover of teh physical act of taking hop steps with RW; touched down nearly every step   Stairs             Wheelchair Mobility     Tilt Bed    Modified Rankin (Stroke Patients Only)       Balance     Sitting balance-Leahy Scale: Fair       Standing balance-Leahy Scale: Poor                              Communication Communication Communication: No apparent difficulties  Cognition Arousal: Alert Behavior During Therapy: WFL for tasks assessed/performed   PT - Cognitive impairments: Problem solving, Sequencing, Awareness  PT - Cognition Comments: difficulty with problem-solving taking steps and keeping weigh toff of L foot, including with cues to hop Following commands: Intact      Cueing Cueing Techniques: Verbal cues, Gestural cues  Exercises      General Comments General comments (skin integrity, edema, etc.): Assisted pt with changing his underwear in the bathroom      Pertinent Vitals/Pain Pain Assessment Pain Assessment: Faces Faces Pain Scale: Hurts a little bit Pain Location: L foot Pain  Descriptors / Indicators: Throbbing Pain Intervention(s): Monitored during session    Home Living                          Prior Function            PT Goals (current goals can now be found in the care plan section) Acute Rehab PT Goals Patient Stated Goal: To get better and be able to bear weight on L foot PT Goal Formulation: With patient/family Time For Goal Achievement: 11/07/23 Potential to Achieve Goals: Good Progress towards PT goals: Progressing toward goals    Frequency    Min 2X/week      PT Plan      Co-evaluation              AM-PAC PT 6 Clicks Mobility   Outcome Measure  Help needed turning from your back to your side while in a flat bed without using bedrails?: None Help needed moving from lying on your back to sitting on the side of a flat bed without using bedrails?: None Help needed moving to and from a bed to a chair (including a wheelchair)?: A Little Help needed standing up from a chair using your arms (e.g., wheelchair or bedside chair)?: A Little Help needed to walk in hospital room?: A Lot Help needed climbing 3-5 steps with a railing? : Total 6 Click Score: 17    End of Session Equipment Utilized During Treatment: Gait belt Activity Tolerance: Patient tolerated treatment well Patient left: in chair;with call bell/phone within reach;with chair alarm set;with family/visitor present Nurse Communication: Mobility status PT Visit Diagnosis: Unsteadiness on feet (R26.81);Other abnormalities of gait and mobility (R26.89);Muscle weakness (generalized) (M62.81);Pain Pain - Right/Left: Left Pain - part of body: Ankle and joints of foot     Time: 8555-8494 PT Time Calculation (min) (ACUTE ONLY): 21 min  Charges:    $Therapeutic Activity: 8-22 mins PT General Charges $$ ACUTE PT VISIT: 1 Visit                     Silvano Currier, PT  Acute Rehabilitation Services Office [phone] Secure Chat welcomed    Silvano VEAR Currier 10/30/2023, 3:57 PM

## 2023-10-31 ENCOUNTER — Inpatient Hospital Stay (HOSPITAL_COMMUNITY): Payer: Medicare (Managed Care)

## 2023-10-31 DIAGNOSIS — R9431 Abnormal electrocardiogram [ECG] [EKG]: Secondary | ICD-10-CM | POA: Diagnosis not present

## 2023-10-31 DIAGNOSIS — R931 Abnormal findings on diagnostic imaging of heart and coronary circulation: Secondary | ICD-10-CM | POA: Diagnosis not present

## 2023-10-31 DIAGNOSIS — A419 Sepsis, unspecified organism: Secondary | ICD-10-CM | POA: Diagnosis not present

## 2023-10-31 DIAGNOSIS — R001 Bradycardia, unspecified: Secondary | ICD-10-CM | POA: Diagnosis not present

## 2023-10-31 DIAGNOSIS — I480 Paroxysmal atrial fibrillation: Secondary | ICD-10-CM

## 2023-10-31 LAB — ECHOCARDIOGRAM COMPLETE
AR max vel: 1.57 cm2
AV Area VTI: 1.55 cm2
AV Area mean vel: 1.55 cm2
AV Mean grad: 15 mmHg
AV Peak grad: 32 mmHg
Ao pk vel: 2.83 m/s
Area-P 1/2: 5.84 cm2
Calc EF: 70.4 %
Height: 68 in
MV VTI: 2.47 cm2
S' Lateral: 3 cm
Single Plane A2C EF: 69.9 %
Single Plane A4C EF: 70 %
Weight: 3312.19 [oz_av]

## 2023-10-31 LAB — HEPATITIS B SURFACE ANTIBODY, QUANTITATIVE: Hep B S AB Quant (Post): 31.4 m[IU]/mL

## 2023-10-31 LAB — GLUCOSE, CAPILLARY
Glucose-Capillary: 126 mg/dL — ABNORMAL HIGH (ref 70–99)
Glucose-Capillary: 236 mg/dL — ABNORMAL HIGH (ref 70–99)
Glucose-Capillary: 185 mg/dL — ABNORMAL HIGH (ref 70–99)
Glucose-Capillary: 145 mg/dL — ABNORMAL HIGH (ref 70–99)

## 2023-10-31 MED ORDER — CHLORHEXIDINE GLUCONATE CLOTH 2 % EX PADS
6.0000 | MEDICATED_PAD | Freq: Every day | CUTANEOUS | Status: DC
  Administered 2023-11-02 – 2023-11-07 (×6): 6 via TOPICAL

## 2023-10-31 MED ORDER — ALTEPLASE 2 MG IJ SOLR: 2.0000 mg | Freq: Once | INTRAMUSCULAR | Status: DC | PRN

## 2023-10-31 MED ORDER — LIDOCAINE-PRILOCAINE 2.5-2.5 % EX CREA: 1.0000 | TOPICAL_CREAM | CUTANEOUS | Status: DC | PRN

## 2023-10-31 MED ORDER — HEPARIN SODIUM (PORCINE) 1000 UNIT/ML DIALYSIS: 1000.0000 [IU] | INTRAMUSCULAR | Status: DC | PRN

## 2023-10-31 MED ORDER — PENTAFLUOROPROP-TETRAFLUOROETH EX AERO: 1.0000 | INHALATION_SPRAY | CUTANEOUS | Status: DC | PRN

## 2023-10-31 MED ORDER — LIDOCAINE HCL (PF) 1 % IJ SOLN: 5.0000 mL | INTRAMUSCULAR | Status: DC | PRN

## 2023-10-31 MED ORDER — SODIUM CHLORIDE 0.9 % IV SOLN: INTRAVENOUS | Status: DC

## 2023-10-31 NOTE — Progress Notes (Addendum)
 PROGRESS NOTE  Edgar Brewer  FMW:[id number] DOB: 11-Feb-1960 DOA: 10/19/2023 PCP: Katrinka Garnette KIDD, MD   Brief Narrative: Patient is a 63 male with history of ESRD on dialysis on Monday, Wednesday, Friday, HFpEF, diabetes type 2, prior CVA who was recently admitted for amputation of L 2nd toe through proximal phalanx  presented with complaint of purulent discharge in the left second toe, chills.  On presentation he was febrile, tachycardic, tachypneic, requiring 2 L of oxygen.  Labs with potassium 5.2, creatinine 5.7, lactic acidosis 5.6.XR L foot w/o acute findings or OM .  Patient was admitted for further management of sepsis secondary to left foot wound infection.  Started on broad spectrum antibiotics.  Podiatry, vascular surgery consulted.  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  PT/OT recommending SNF on discharge.  TTE showed 1.1 cm calcified mobile echodensity on the anterior mitral valve leaflet, plan for TEE tomorrow  Assessment & Plan:  Principal Problem:   Sepsis (HCC) Active Problems:   Pyogenic inflammation of bone (HCC)   Diabetes mellitus type 2 with complications (HCC)   ESRD on dialysis (HCC)   Neuropathy   PAD (peripheral artery disease)   Gangrene of left foot (HCC)   Bradycardia   PAF (paroxysmal atrial fibrillation) (HCC)  Sepsis secondary to surgical wound infection of the left foot: Recent history of amputation of L 2nd toe through proximal phalanx.  Presented with fever, chills, tachycardia, tachypnea, lactic acidosis.Started on broad spectrum antibiotics.  Podiatry, vascular surgery consulted.  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 Podiatry recommended nonweightbearing on postop shoe left lower extremity for 2 weeks. Culture showed mixed organisms.  Currently on Augmentin .  Blood cultures have been negative.  Bradycardia/secondary heart block: Became bradycardia in the afternoon of 10/4.  Did not require atropine or  temporary pacemaker.  Heart rate spontaneously stabilized.  Blood pressure remained stable.  Cardiology consulted.  TSH normal.  EKG shows second-degree heart block, Mobitz type I/Wenckebach.  Heart rate remains stable now .  Monitor on telemetry.  Continue to monitor electrolytes.  Avoid AV nodal blocking agents.Echo showed EF of 60 to 65%, normal right ventricular function but also showed  1.1 x 1.5 calcified mobile echodensity attached to the base  and medial aspect (A1) of the anterior MV leaflet. Plan for TEE  Type 2 diabetes: Continue current insulin  regimen.  Diabetic coordinator following  ESRD on dialysis/metabolic bone disease/hyperkalemia: Nephrology following for dialysis.  Continue Renvela , Sensipar .  Dialyzed on schedule.  Neuropathy: Gabapentin   Peripheral artery disease: Continue Crestor .  Follow-up with vascular surgery as an outpatient.  Obesity: BMI of 32  Constipation: Continue bowel regimen  Deconditioning/debility: PT recommended SNF on discharge.  Waiting for bed availability.  TOC following       DVT prophylaxis:heparin  injection 5,000 Units Start: 10/26/23 0915     Code Status: Full Code  Family Communication: Mother at bedside on 10/7  Patient status:Inpatient  Patient is from :Home  Anticipated discharge to:SNF  Estimated DC date:whenever possible   Consultants: Podiatry,vascular surgery  Procedures:transmetatarsal amputation of left foot   Antimicrobials:  Anti-infectives (From admission, onward)    Start     Dose/Rate Route Frequency Ordered Stop   10/27/23 2200  amoxicillin -clavulanate (AUGMENTIN ) 500-125 MG per tablet 1 tablet        1 tablet Oral Daily at bedtime 10/27/23 0849 11/01/23 2159   10/27/23 1000  amoxicillin -clavulanate (AUGMENTIN ) 500-125 MG per tablet 1 tablet  Status:  Discontinued  1 tablet Oral Every 12 hours 10/26/23 2155 10/27/23 0849   10/26/23 2245  amoxicillin -clavulanate (AUGMENTIN ) 500-125 MG per tablet 1  tablet  Status:  Discontinued        1 tablet Oral Every 12 hours 10/26/23 2154 10/26/23 2155   10/23/23 1800  vancomycin  (VANCOCIN ) IVPB 1000 mg/200 mL premix  Status:  Discontinued        1,000 mg 200 mL/hr over 60 Minutes Intravenous Every M-W-F (Hemodialysis) 10/23/23 1457 10/27/23 0846   10/20/23 1800  ceFEPIme  (MAXIPIME ) 1 g in sodium chloride  0.9 % 100 mL IVPB  Status:  Discontinued        1 g 200 mL/hr over 30 Minutes Intravenous Every 24 hours 10/19/23 1311 10/26/23 2154   10/20/23 1200  vancomycin  (VANCOCIN ) IVPB 1000 mg/200 mL premix  Status:  Discontinued        1,000 mg 200 mL/hr over 60 Minutes Intravenous Every M-W-F (Hemodialysis) 10/20/23 0725 10/23/23 1457   10/19/23 2200  metroNIDAZOLE  (FLAGYL ) tablet 500 mg  Status:  Discontinued        500 mg Oral Every 12 hours 10/19/23 1235 10/26/23 2154   10/19/23 1310  vancomycin  variable dose per unstable renal function (pharmacist dosing)  Status:  Discontinued         Does not apply See admin instructions 10/19/23 1311 10/20/23 0725   10/19/23 0845  ceFEPIme  (MAXIPIME ) 2 g in sodium chloride  0.9 % 100 mL IVPB        2 g 200 mL/hr over 30 Minutes Intravenous  Once 10/19/23 0839 10/19/23 1347   10/19/23 0845  metroNIDAZOLE  (FLAGYL ) IVPB 500 mg        500 mg 100 mL/hr over 60 Minutes Intravenous  Once 10/19/23 0839 10/19/23 1519   10/19/23 0845  vancomycin  (VANCOCIN ) IVPB 1000 mg/200 mL premix  Status:  Discontinued        1,000 mg 200 mL/hr over 60 Minutes Intravenous  Once 10/19/23 0839 10/19/23 0839   10/19/23 0845  vancomycin  (VANCOREADY) IVPB 2000 mg/400 mL        2,000 mg 200 mL/hr over 120 Minutes Intravenous STAT 10/19/23 0839 10/19/23 1207       Subjective: Patient seen and examined at bedside today.  Comfortable.  Lying on bed.  Heart rate stable.  Denies any complaints today.  No new issues  Objective: Vitals:   10/30/23 1643 10/30/23 2027 10/31/23 0427 10/31/23 0737  BP: (!) 92/53 (!) 93/57 (!) 124/51 (!)  103/55  Pulse: 99 86 88 93  Resp: 18 18 18 19   Temp: 99.2 F (37.3 C) 98.7 F (37.1 C) 99.1 F (37.3 C) 99.5 F (37.5 C)  TempSrc: Oral Oral Oral Oral  SpO2: 98% 92% 92% 95%  Weight:      Height:        Intake/Output Summary (Last 24 hours) at 10/31/2023 1318 Last data filed at 10/31/2023 0830 Gross per 24 hour  Intake 360 ml  Output 0 ml  Net 360 ml   Filed Weights   10/27/23 1803 10/30/23 0843 10/30/23 1243  Weight: 97 kg 95.9 kg 93.9 kg    Examination:  General exam: Overall comfortable, not in distress,lying on bed HEENT: PERRL Respiratory system:  no wheezes or crackles  Cardiovascular system: S1 & S2 heard, RRR.  Gastrointestinal system: Abdomen is nondistended, soft and nontender. Central nervous system: Alert and oriented Extremities: No edema, no clubbing ,no cyanosis, amputation of the left foot/covered with dressing, left upper extremity AV fistula Skin: No rashes,  no ulcers,no icterus        Data Reviewed: I have personally reviewed following labs and imaging studies  CBC: Recent Labs  Lab 10/26/23 0206 10/27/23 0324 10/28/23 0751 10/28/23 1542 10/30/23 0900  WBC 9.9 10.6* 12.9* 11.5* 11.9*  NEUTROABS  --   --   --  9.2*  --   HGB 9.9* 10.2* 11.3* 10.6* 9.9*  HCT 30.4* 30.9* 34.2* 32.1* 30.6*  MCV 95.0 93.9 94.0 94.7 95.6  PLT 272 289 325 279 271   Basic Metabolic Panel: Recent Labs  Lab 10/25/23 0917 10/26/23 0206 10/27/23 0324 10/28/23 0751 10/28/23 1542 10/29/23 0310 10/30/23 0535  NA 134*   < > 133* 131* 132* 131* 132*  K 5.5*   < > 5.1 5.0 4.8 5.2* 5.6*  CL 95*   < > 91* 90* 89* 89* 91*  CO2 22   < > 25 26 26 25 24   GLUCOSE 231*   < > 75 144* 137* 118* 116*  BUN 89*   < > 71* 43* 50* 55* 71*  CREATININE 12.94*   < > 11.18* 8.70* 2.57* 11.05* 13.90*  CALCIUM  9.1   < > 9.5 10.1 9.9 9.4 9.4  MG  --   --   --   --   --   --  2.4  PHOS 8.6*  --   --   --   --   --   --    < > = values in this interval not displayed.     Recent  Results (from the past 240 hours)  Aerobic/Anaerobic Culture w Gram Stain (surgical/deep wound)     Status: None (Preliminary result)   Collection Time: 10/23/23  4:40 PM   Specimen: Wound; Tissue  Result Value Ref Range Status   Specimen Description TISSUE LEFT FOOT  Final   Special Requests NONE  Final   Gram Stain NO WBC SEEN RARE GRAM NEGATIVE RODS   Final   Culture   Final    MODERATE ENTEROCOCCUS FAECALIS FEW STENOTROPHOMONAS MALTOPHILIA FEW STAPHYLOCOCCUS AUREUS NO ANAEROBES ISOLATED Sent to [facility] for further susceptibility testing. Performed at [facility], [address], [place], [place] [address]    Report Status PENDING  Incomplete   Organism ID, Bacteria ENTEROCOCCUS FAECALIS  Final   Organism ID, Bacteria STAPHYLOCOCCUS AUREUS  Final      Susceptibility   Enterococcus faecalis - MIC*    AMPICILLIN  <=2 SENSITIVE Sensitive     VANCOMYCIN  2 SENSITIVE Sensitive     GENTAMICIN SYNERGY SENSITIVE Sensitive     * MODERATE ENTEROCOCCUS FAECALIS   Staphylococcus aureus - MIC*    CIPROFLOXACIN <=0.5 SENSITIVE Sensitive     ERYTHROMYCIN <=0.25 SENSITIVE Sensitive     GENTAMICIN <=0.5 SENSITIVE Sensitive     OXACILLIN 0.5 SENSITIVE Sensitive     TETRACYCLINE <=1 SENSITIVE Sensitive     VANCOMYCIN  <=0.5 SENSITIVE Sensitive     TRIMETH/SULFA <=10 SENSITIVE Sensitive     CLINDAMYCIN  <=0.25 SENSITIVE Sensitive     RIFAMPIN <=0.5 SENSITIVE Sensitive     Inducible Clindamycin  NEGATIVE Sensitive     LINEZOLID  2 SENSITIVE Sensitive     * FEW STAPHYLOCOCCUS AUREUS  Susceptibility, Aer + Anaerob     Status: Abnormal   Collection Time: 10/23/23  4:40 PM  Result Value Ref Range Status   Suscept, Aer + Anaerob Final report (A)  Corrected    Comment: (NOTE) Performed At: [facility] [employer] [address] [place], [place] [id number] Jennette  Frankey MD Ey:[phone] CORRECTED ON 10/06 AT 0835: PREVIOUSLY REPORTED AS Preliminary report    Source STENOTROPHOMONAS  MALTOPHILIA/ TISSUE LEFT FOOT  Final    Comment: Performed at [facility], [address], [place], [place] [address]  Susceptibility Result     Status: Abnormal   Collection Time: 10/23/23  4:40 PM  Result Value Ref Range Status   Suscept Result 1 Comment (A)  Final    Comment: (NOTE) Stenotrophomonas maltophilia Identification performed by account, not confirmed by this laboratory. Levofloxacin and Trimethoprim-Sulfamethoxazole should not be used alone for antimicrobial therapy (CLSI).    Antimicrobial Suscept Comment  Corrected    Comment: (NOTE)      ** S = Susceptible; I = Intermediate; R = Resistant **                   P = Positive; N = Negative            MICS are expressed in micrograms per mL   Antibiotic                 RSLT#1    RSLT#2    RSLT#3    RSLT#4 Levofloxacin                   S Minocycline                    S Trimethoprim/Sulfa             S Performed At: [facility] [employer] [address] [place], [place] [id number] Jennette Frankey MD Ey:[phone]      Radiology Studies: ECHOCARDIOGRAM COMPLETE Result Date: 10/31/2023    ECHOCARDIOGRAM REPORT   Patient Name:   Edgar Brewer Date of Exam: 10/31/2023 Medical Rec #:  [id number]    Height:       68.0 in Accession #:    [id number]   Weight:       207.0 lb Date of Birth:  27-Mar-1960    BSA:          2.074 m Patient Age:    63 years     BP:           124/51 mmHg Patient Gender: M            HR:           96 bpm. Exam Location:  Inpatient Procedure: 2D Echo, Cardiac Doppler and Color Doppler (Both Spectral and Color            Flow Doppler were utilized during procedure). Indications:    Abnormal EKG R94.31  History:        Patient has prior history of Echocardiogram examinations, most                 recent 11/17/2023. CHF, Stroke; Risk Factors:Hypertension and                 Diabetes. H/O Pulmonary hypertension, ESRD, Bradycardia, Aortic                 stenosis.  Sonographer:    BERNARDA ROCKS Referring Phys:  [id number] Relena Ivancic IMPRESSIONS  1. Left ventricular ejection fraction, by estimation, is 60 to 65%. The left ventricle has normal function. The left ventricle has no regional wall motion abnormalities. Left ventricular diastolic parameters were normal. There is the interventricular septum is flattened in diastole ('D' shaped left ventricle), consistent with right ventricular volume overload.  2. Right ventricular systolic  function is normal. The right ventricular size is normal.  3. There is a 1.1 x 1.5 calcified mobile echodensity attached to the base and medial aspect (A1) of the anterior MV leaflet. . The mitral valve is normal in structure. No evidence of mitral valve regurgitation. No evidence of mitral stenosis.  4. The aortic valve is normal in structure. There is moderate calcification of the aortic valve. Aortic valve regurgitation is not visualized. Mild to moderate aortic valve stenosis. Aortic valve area, by VTI measures 1.55 cm. Aortic valve mean gradient measures 15.0 mmHg. Aortic valve Vmax measures 2.83 m/s.  5. The inferior vena cava is normal in size with greater than 50% respiratory variability, suggesting right atrial pressure of 3 mmHg. FINDINGS  Left Ventricle: Left ventricular ejection fraction, by estimation, is 60 to 65%. The left ventricle has normal function. The left ventricle has no regional wall motion abnormalities. The left ventricular internal cavity size was normal in size. There is  no left ventricular hypertrophy. The interventricular septum is flattened in diastole ('D' shaped left ventricle), consistent with right ventricular volume overload. Left ventricular diastolic parameters were normal. Right Ventricle: The right ventricular size is normal. No increase in right ventricular wall thickness. Right ventricular systolic function is normal. Left Atrium: Left atrial size was normal in size. Right Atrium: Right atrial size was normal in size. Pericardium: There is no evidence  of pericardial effusion. Mitral Valve: There is a 1.1 x 1.5 calcified mobile echodensity attached to the base and medial aspect (A1) of the anterior MV leaflet. The mitral valve is normal in structure. No evidence of mitral valve regurgitation. No evidence of mitral valve stenosis. MV peak gradient, 11.3 mmHg. The mean mitral valve gradient is 4.0 mmHg. Tricuspid Valve: The tricuspid valve is normal in structure. Tricuspid valve regurgitation is trivial. No evidence of tricuspid stenosis. Aortic Valve: The aortic valve is normal in structure. There is moderate calcification of the aortic valve. Aortic valve regurgitation is not visualized. Mild to moderate aortic stenosis is present. Aortic valve mean gradient measures 15.0 mmHg. Aortic valve peak gradient measures 32.0 mmHg. Aortic valve area, by VTI measures 1.55 cm. Pulmonic Valve: The pulmonic valve was normal in structure. Pulmonic valve regurgitation is not visualized. No evidence of pulmonic stenosis. Aorta: The aortic root is normal in size and structure. Venous: The inferior vena cava is normal in size with greater than 50% respiratory variability, suggesting right atrial pressure of 3 mmHg. IAS/Shunts: No atrial level shunt detected by color flow Doppler.  LEFT VENTRICLE PLAX 2D LVIDd:         4.30 cm      Diastology LVIDs:         3.00 cm      LV e' medial:    10.10 cm/s LV PW:         0.90 cm      LV E/e' medial:  15.0 LV IVS:        0.90 cm      LV e' lateral:   14.00 cm/s LVOT diam:     2.20 cm      LV E/e' lateral: 10.8 LV SV:         75 LV SV Index:   36 LVOT Area:     3.80 cm  LV Volumes (MOD) LV vol d, MOD A2C: 164.0 ml LV vol d, MOD A4C: 189.0 ml LV vol s, MOD A2C: 49.3 ml LV vol s, MOD A4C: 56.7 ml LV SV MOD A2C:  114.7 ml LV SV MOD A4C:     189.0 ml LV SV MOD BP:      124.6 ml RIGHT VENTRICLE             IVC RV Basal diam:  3.20 cm     IVC diam: 1.90 cm RV S prime:     12.90 cm/s TAPSE (M-mode): 1.4 cm LEFT ATRIUM             Index         RIGHT ATRIUM           Index LA diam:        3.90 cm 1.88 cm/m   RA Area:     14.40 cm LA Vol (A2C):   42.0 ml 20.25 ml/m  RA Volume:   34.70 ml  16.73 ml/m LA Vol (A4C):   71.8 ml 34.62 ml/m LA Biplane Vol: 57.0 ml 27.48 ml/m  AORTIC VALVE                     PULMONIC VALVE AV Area (Vmax):    1.57 cm      PV Vmax:          1.23 m/s AV Area (Vmean):   1.55 cm      PV Peak grad:     6.1 mmHg AV Area (VTI):     1.55 cm      PR End Diast Vel: 1.07 msec AV Vmax:           283.00 cm/s AV Vmean:          180.000 cm/s AV VTI:            0.485 m AV Peak Grad:      32.0 mmHg AV Mean Grad:      15.0 mmHg LVOT Vmax:         117.00 cm/s LVOT Vmean:        73.300 cm/s LVOT VTI:          0.198 m LVOT/AV VTI ratio: 0.41  AORTA Ao Root diam: 3.00 cm Ao Asc diam:  3.20 cm MITRAL VALVE MV Area (PHT): 5.84 cm     SHUNTS MV Area VTI:   2.47 cm     Systemic VTI:  0.20 m MV Peak grad:  11.3 mmHg    Systemic Diam: 2.20 cm MV Mean grad:  4.0 mmHg MV Vmax:       1.68 m/s MV Vmean:      88.2 cm/s MV Decel Time: 130 msec MV E velocity: 151.00 cm/s MV A velocity: 41.40 cm/s MV E/A ratio:  3.65 Franck Azobou Tonleu Electronically signed by Joelle Cedars Tonleu Signature Date/Time: 10/31/2023/9:23:36 AM    Final     Scheduled Meds:  (feeding supplement) PROSource Plus  30 mL Oral BID BM   amoxicillin -clavulanate  1 tablet Oral QHS   aspirin  EC  81 mg Oral Daily   [START ON 11/01/2023] Chlorhexidine  Gluconate Cloth  6 each Topical Q0600   cinacalcet   60 mg Oral Q T,Th,Sat-1800   darbepoetin (ARANESP ) injection - DIALYSIS  60 mcg Subcutaneous Q Fri-1800   gabapentin   100 mg Oral TID   heparin  injection (subcutaneous)  5,000 Units Subcutaneous Q8H   insulin  aspart  0-6 Units Subcutaneous TID WC   insulin  aspart  4 Units Subcutaneous TID WC   insulin  glargine  50 Units Subcutaneous Daily   multivitamin  1 tablet Oral QHS   mupirocin  cream   Topical  QODAY   pantoprazole   40 mg Oral Daily   polyethylene glycol  17 g Oral  Daily   rosuvastatin   10 mg Oral Daily   senna-docusate  1 tablet Oral BID   sevelamer  carbonate  1,600 mg Oral TID WC   Continuous Infusions:      LOS: 12 days   Ivonne Mustache, MD [facility] P10/07/2023, 1:18 PM

## 2023-10-31 NOTE — Progress Notes (Signed)
 Physical Therapy Treatment Patient Details Name: Edgar Brewer MRN: [id number] DOB: 1960-03-27 Today's Date: 10/31/2023   History of Present Illness Edgar Brewer is a 63 y.o. male and recent admission for amputation of L 2nd toe through proximal phalanx p/w  sepsis surgical wound infection, s/p L transmetatarsal amputation on 9/29, NWB; with medical history significant of ESRD on HD MWF, HFpEF (EF 60-65% in 08/2020) , DM2, prior CVA w/o residual deficits    PT Comments  Continuing work on functional mobility and activity tolerance;  Session focused on keeping NWB during functional transfers, with modest, but notable improvements compared to yesterday's session; Better attention and engagement in session as well; Still, it will likely be worth considering introducing wheelchair mobility in the next few PT sessions   If plan is discharge home, recommend the following: A lot of help with walking and/or transfers;Help with stairs or ramp for entrance;A lot of help with bathing/dressing/bathroom;Assist for transportation;Assistance with cooking/housework   Can travel by private vehicle     No  Equipment Recommendations  Wheelchair (measurements PT);Wheelchair cushion (measurements PT);Rolling walker (2 wheels);Other (comment) (drop-arm BSC)    Recommendations for Other Services       Precautions / Restrictions Precautions Precautions: Fall Recall of Precautions/Restrictions: Impaired Other Brace: postop shoe Restrictions LLE Weight Bearing Per Provider Order: Non weight bearing     Mobility  Bed Mobility Overal bed mobility: Needs Assistance Bed Mobility: Supine to Sit     Supine to sit: Supervision     General bed mobility comments: Extra time, no physical assist.    Transfers Overall transfer level: Needs assistance Equipment used: Rolling walker (2 wheels) Transfers: Sit to/from Stand, Bed to chair/wheelchair/BSC Sit to Stand: Min assist   Step pivot transfers: Min  assist Squat pivot transfers: Min assist     General transfer comment: Min assist for boost and balance. Good power-up with Rt leg, requires multiple cues for hand placement and set-up prior to rising; still with difficulty keeping weight off of L foot; worked on squat pivot transfers, min assist and noted pt getting very close to EOB before pushing up on RLE and pulling self over with RUE    Ambulation/Gait Ambulation/Gait assistance: Min assist, +2 safety/equipment Gait Distance (Feet): 4 Feet Assistive device: Rolling walker (2 wheels) Gait Pattern/deviations:  (hop)       General Gait Details: Repeated use of demonstration today; noting improved stepping on R foot only, with efforts at hop steps; still touching down and stepping on LLE occasionally, but improved from previous session   Stairs             Wheelchair Mobility     Tilt Bed    Modified Rankin (Stroke Patients Only)       Balance     Sitting balance-Leahy Scale: Fair       Standing balance-Leahy Scale: Poor                              Communication Communication Communication: No apparent difficulties  Cognition Arousal: Alert Behavior During Therapy: WFL for tasks assessed/performed   PT - Cognitive impairments: Problem solving, Sequencing, Awareness                       PT - Cognition Comments: difficulty with problem-solving taking steps and keeping weight off of L foot, including with cues to hop Following commands: Intact      Cueing Cueing  Techniques: Verbal cues, Gestural cues, Other (comments) (Demonstration)  Exercises      General Comments General comments (skin integrity, edema, etc.): Placed pillow in recliner with the hopes of it being comfortable enough for pt to tolerate more time OOB      Pertinent Vitals/Pain Pain Assessment Pain Assessment: Faces Faces Pain Scale: Hurts a little bit Pain Location: L foot Pain Descriptors / Indicators:  Throbbing Pain Intervention(s): Monitored during session    Home Living                          Prior Function            PT Goals (current goals can now be found in the care plan section) Acute Rehab PT Goals Patient Stated Goal: To get better and be able to bear weight on L foot PT Goal Formulation: With patient/family Time For Goal Achievement: 11/07/23 Potential to Achieve Goals: Good Progress towards PT goals: Progressing toward goals (slowly)    Frequency    Min 2X/week      PT Plan      Co-evaluation              AM-PAC PT 6 Clicks Mobility   Outcome Measure  Help needed turning from your back to your side while in a flat bed without using bedrails?: None Help needed moving from lying on your back to sitting on the side of a flat bed without using bedrails?: None Help needed moving to and from a bed to a chair (including a wheelchair)?: A Little Help needed standing up from a chair using your arms (e.g., wheelchair or bedside chair)?: A Little Help needed to walk in hospital room?: A Lot Help needed climbing 3-5 steps with a railing? : Total 6 Click Score: 17    End of Session Equipment Utilized During Treatment: Gait belt Activity Tolerance: Patient tolerated treatment well Patient left: in chair;with call bell/phone within reach;with chair alarm set Nurse Communication: Mobility status PT Visit Diagnosis: Unsteadiness on feet (R26.81);Other abnormalities of gait and mobility (R26.89);Muscle weakness (generalized) (M62.81);Pain Pain - Right/Left: Left Pain - part of body: Ankle and joints of foot     Time: 1335-1414 (minus approx 10 min as Dr. Delford came in) PT Time Calculation (min) (ACUTE ONLY): 39 min  Charges:    $Gait Training: 8-22 mins $Therapeutic Activity: 8-22 mins PT General Charges $$ ACUTE PT VISIT: 1 Visit                     Silvano Currier, PT  Acute Rehabilitation Services Office [phone] Secure Chat  welcomed    Silvano VEAR Currier 10/31/2023, 3:23 PM

## 2023-10-31 NOTE — Progress Notes (Signed)
 Subjective:  Asked to see patient for abnormal echo. 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 BC;s on 9/25 were negative   Objective:  Vitals:   10/30/23 1643 10/30/23 2027 10/31/23 0427 10/31/23 0737  BP: (!) 92/53 (!) 93/57 (!) 124/51 (!) 103/55  Pulse: 99 86 88 93  Resp: 18 18 18 19   Temp: 99.2 F (37.3 C) 98.7 F (37.1 C) 99.1 F (37.3 C) 99.5 F (37.5 C)  TempSrc: Oral Oral Oral Oral  SpO2: 98% 92% 92% 95%  Weight:      Height:        Intake/Output from previous day:  Intake/Output Summary (Last 24 hours) at 10/31/2023 1411 Last data filed at 10/31/2023 1330 Gross per 24 hour  Intake 600 ml  Output 0 ml  Net 600 ml    Physical Exam:  Chronically ill male no distress Lungs clear AS murmur Abdomen benign No edema Post amputation left forefoot Left brachiocephalic fistula   Lab Results: Basic Metabolic Panel: Recent Labs    10/29/23 0310 10/30/23 0535  NA 131* 132*  K 5.2* 5.6*  CL 89* 91*  CO2 25 24  GLUCOSE 118* 116*  BUN 55* 71*  CREATININE 11.05* 13.90*  CALCIUM  9.4 9.4  MG  --  2.4   Liver Function Tests: Recent Labs    10/28/23 1542  AST 16  ALT 15  ALKPHOS 215*  BILITOT 0.6  PROT 6.9  ALBUMIN 2.6*   No results for input(s): LIPASE, AMYLASE in the last 72 hours. CBC: Recent Labs    10/28/23 1542 10/30/23 0900  WBC 11.5* 11.9*  NEUTROABS 9.2*  --   HGB 10.6* 9.9*  HCT 32.1* 30.6*  MCV 94.7 95.6  PLT 279 271    Thyroid  Function Tests: Recent Labs    10/28/23 1542  TSH 1.405   Anemia Panel: No results for input(s): VITAMINB12, FOLATE, FERRITIN, TIBC, IRON, RETICCTPCT in the last 72 hours.  Imaging: ECHOCARDIOGRAM COMPLETE Result Date: 10/31/2023    ECHOCARDIOGRAM REPORT   Patient Name:   Edgar Brewer Date of Exam: 10/31/2023 Medical Rec #:  [id number]    Height:       68.0 in Accession #:    [id number]   Weight:       207.0 lb Date of Birth:  1960-11-21    BSA:          2.074 m  Patient Age:    63 years     BP:           124/51 mmHg Patient Gender: M            HR:           96 bpm. Exam Location:  Inpatient Procedure: 2D Echo, Cardiac Doppler and Color Doppler (Both Spectral and Color            Flow Doppler were utilized during procedure). Indications:    Abnormal EKG R94.31  History:        Patient has prior history of Echocardiogram examinations, most                 recent 11/17/2023. CHF, Stroke; Risk Factors:Hypertension and                 Diabetes. H/O Pulmonary hypertension, ESRD, Bradycardia, Aortic                 stenosis.  Sonographer:    BERNARDA ROCKS Referring Phys: [id number] AMRIT ADHIKARI IMPRESSIONS  1. Left ventricular ejection fraction, by estimation, is 60 to 65%. The left ventricle has normal function. The left ventricle has no regional wall motion abnormalities. Left ventricular diastolic parameters were normal. There is the interventricular septum is flattened in diastole ('D' shaped left ventricle), consistent with right ventricular volume overload.  2. Right ventricular systolic function is normal. The right ventricular size is normal.  3. There is a 1.1 x 1.5 calcified mobile echodensity attached to the base and medial aspect (A1) of the anterior MV leaflet. . The mitral valve is normal in structure. No evidence of mitral valve regurgitation. No evidence of mitral stenosis.  4. The aortic valve is normal in structure. There is moderate calcification of the aortic valve. Aortic valve regurgitation is not visualized. Mild to moderate aortic valve stenosis. Aortic valve area, by VTI measures 1.55 cm. Aortic valve mean gradient measures 15.0 mmHg. Aortic valve Vmax measures 2.83 m/s.  5. The inferior vena cava is normal in size with greater than 50% respiratory variability, suggesting right atrial pressure of 3 mmHg. FINDINGS  Left Ventricle: Left ventricular ejection fraction, by estimation, is 60 to 65%. The left ventricle has normal function. The left ventricle has  no regional wall motion abnormalities. The left ventricular internal cavity size was normal in size. There is  no left ventricular hypertrophy. The interventricular septum is flattened in diastole ('D' shaped left ventricle), consistent with right ventricular volume overload. Left ventricular diastolic parameters were normal. Right Ventricle: The right ventricular size is normal. No increase in right ventricular wall thickness. Right ventricular systolic function is normal. Left Atrium: Left atrial size was normal in size. Right Atrium: Right atrial size was normal in size. Pericardium: There is no evidence of pericardial effusion. Mitral Valve: There is a 1.1 x 1.5 calcified mobile echodensity attached to the base and medial aspect (A1) of the anterior MV leaflet. The mitral valve is normal in structure. No evidence of mitral valve regurgitation. No evidence of mitral valve stenosis. MV peak gradient, 11.3 mmHg. The mean mitral valve gradient is 4.0 mmHg. Tricuspid Valve: The tricuspid valve is normal in structure. Tricuspid valve regurgitation is trivial. No evidence of tricuspid stenosis. Aortic Valve: The aortic valve is normal in structure. There is moderate calcification of the aortic valve. Aortic valve regurgitation is not visualized. Mild to moderate aortic stenosis is present. Aortic valve mean gradient measures 15.0 mmHg. Aortic valve peak gradient measures 32.0 mmHg. Aortic valve area, by VTI measures 1.55 cm. Pulmonic Valve: The pulmonic valve was normal in structure. Pulmonic valve regurgitation is not visualized. No evidence of pulmonic stenosis. Aorta: The aortic root is normal in size and structure. Venous: The inferior vena cava is normal in size with greater than 50% respiratory variability, suggesting right atrial pressure of 3 mmHg. IAS/Shunts: No atrial level shunt detected by color flow Doppler.  LEFT VENTRICLE PLAX 2D LVIDd:         4.30 cm      Diastology LVIDs:         3.00 cm      LV e'  medial:    10.10 cm/s LV PW:         0.90 cm      LV E/e' medial:  15.0 LV IVS:        0.90 cm      LV e' lateral:   14.00 cm/s LVOT diam:     2.20 cm      LV E/e' lateral: 10.8 LV SV:  75 LV SV Index:   36 LVOT Area:     3.80 cm  LV Volumes (MOD) LV vol d, MOD A2C: 164.0 ml LV vol d, MOD A4C: 189.0 ml LV vol s, MOD A2C: 49.3 ml LV vol s, MOD A4C: 56.7 ml LV SV MOD A2C:     114.7 ml LV SV MOD A4C:     189.0 ml LV SV MOD BP:      124.6 ml RIGHT VENTRICLE             IVC RV Basal diam:  3.20 cm     IVC diam: 1.90 cm RV S prime:     12.90 cm/s TAPSE (M-mode): 1.4 cm LEFT ATRIUM             Index        RIGHT ATRIUM           Index LA diam:        3.90 cm 1.88 cm/m   RA Area:     14.40 cm LA Vol (A2C):   42.0 ml 20.25 ml/m  RA Volume:   34.70 ml  16.73 ml/m LA Vol (A4C):   71.8 ml 34.62 ml/m LA Biplane Vol: 57.0 ml 27.48 ml/m  AORTIC VALVE                     PULMONIC VALVE AV Area (Vmax):    1.57 cm      PV Vmax:          1.23 m/s AV Area (Vmean):   1.55 cm      PV Peak grad:     6.1 mmHg AV Area (VTI):     1.55 cm      PR End Diast Vel: 1.07 msec AV Vmax:           283.00 cm/s AV Vmean:          180.000 cm/s AV VTI:            0.485 m AV Peak Grad:      32.0 mmHg AV Mean Grad:      15.0 mmHg LVOT Vmax:         117.00 cm/s LVOT Vmean:        73.300 cm/s LVOT VTI:          0.198 m LVOT/AV VTI ratio: 0.41  AORTA Ao Root diam: 3.00 cm Ao Asc diam:  3.20 cm MITRAL VALVE MV Area (PHT): 5.84 cm     SHUNTS MV Area VTI:   2.47 cm     Systemic VTI:  0.20 m MV Peak grad:  11.3 mmHg    Systemic Diam: 2.20 cm MV Mean grad:  4.0 mmHg MV Vmax:       1.68 m/s MV Vmean:      88.2 cm/s MV Decel Time: 130 msec MV E velocity: 151.00 cm/s MV A velocity: 41.40 cm/s MV E/A ratio:  3.65 Franck Azobou Tonleu Electronically signed by Joelle Cedars Tonleu Signature Date/Time: 10/31/2023/9:23:36 AM    Final     Cardiac Studies:  ECG: SR rate 69 wenkebach   Telemetry: SR rates 80-90  Echo: Reviewed 10/7 EF 60-65% 1.1 x 1.5  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  mild/mod AS   Medications:    (feeding supplement) PROSource Plus  30 mL Oral BID BM   amoxicillin -clavulanate  1 tablet Oral QHS   aspirin  EC  81 mg Oral Daily   [  START ON 11/01/2023] Chlorhexidine  Gluconate Cloth  6 each Topical Q0600   cinacalcet   60 mg Oral Q T,Th,Sat-1800   darbepoetin (ARANESP ) injection - DIALYSIS  60 mcg Subcutaneous Q Fri-1800   gabapentin   100 mg Oral TID   heparin  injection (subcutaneous)  5,000 Units Subcutaneous Q8H   insulin  aspart  0-6 Units Subcutaneous TID WC   insulin  aspart  4 Units Subcutaneous TID WC   insulin  glargine  50 Units Subcutaneous Daily   multivitamin  1 tablet Oral QHS   mupirocin  cream   Topical QODAY   pantoprazole   40 mg Oral Daily   polyethylene glycol  17 g Oral Daily   rosuvastatin   10 mg Oral Daily   senna-docusate  1 tablet Oral BID   sevelamer  carbonate  1,600 mg Oral TID WC      Assessment/Plan:   ? SBE:  admitted with sepsis and left foot infection growing multiple organisms including enterococcus, and staph on path. BC;s negative 9/25 TTE with mass on anterior leaflet of MV that appears more chronic and calcified. Discussed TEE in am No contraindications willing to proceed. If vegetation suggested would need ID recs for antibiotic coverage for 4-6 weeks. He is not a candidate for surgery due to co morbidities and deformity not causing any significant MR Bradycardia:  seen by Dr Cindie earlier this admission for such. Wenkebach rhtym with no long pauses. No further monitoring or w/u suggested CRF:  continue dialysis via left brachiocephalic access   Maude Emmer 10/31/2023, 2:11 PM

## 2023-10-31 NOTE — Progress Notes (Signed)
 Mobility Specialist Progress Note:   10/31/23 1200  Mobility  Activity Ambulated with assistance  Level of Assistance Minimal assist, patient does 75% or more  Assistive Device Front wheel walker  Distance Ambulated (ft) 10 ft  LLE Weight Bearing Per Provider Order NWB  Activity Response Tolerated fair  Mobility Referral Yes  Mobility visit 1 Mobility  Mobility Specialist Start Time (ACUTE ONLY) 1010  Mobility Specialist Stop Time (ACUTE ONLY) 1030  Mobility Specialist Time Calculation (min) (ACUTE ONLY) 20 min   Pt received in bed agreeable to mobility. Pt was modI for bed mobility; minA for STS. Attempted to hop with +2 assist. Pt was unable to maintain NWB precautions with max VC, distance limited. Left in chair w/ call bell and personal belongings in reach. All needs met. Chair alarm on.  Thersia Minder Mobility Specialist  Please contact vis Secure Chat or  Rehab Office [phone]

## 2023-10-31 NOTE — Progress Notes (Signed)
 0730: Nurse to nurse report received by Levon, RN  [phone]: Patient's bed alarm sounding, this nurse to bedside. Patient is seen dangling on R side of bed with bedside table and breakfast in front of him. Bed alarm changed to left the bed. Patient updated and educated on his plan for an echo today, he states understanding. Denies any further needs at this time.  1010: Patient walking out of restroom with walker and assistance from PT. Patient placed in recliner with chair alarm on. Tesia bedside speaking with mother about placement upon d/c. Patient states 1 occurrence of stool while with PT.  1019: Neurology NP bedside   Morning Assessment:  Clear but diminished breathe sounds on L lobe, active bowel sounds in RUQ, hypo and faint in three remaining, patient still unable to remain NWB on L foot  1224: Medications given, patient laying in be, states comfortable. Lights dimmed, patient resting, bed alarm on.  1400: Cardiology bedside apeaking with patient about plan for tomorrow. Patient states understanding. Physical Therapist places patient in chair at this time.  1545: Patient seen resting in bed, lights on, denies any needs at this time. States would like to take evening medications when food arrives, medications placed bedside  1800: Patient seen resting in bed, denies any needs at this time. Sister bedside.   Report given to [facility] for night shift. Patient is scheduled to have echocardiogram in the morning

## 2023-10-31 NOTE — Plan of Care (Signed)
 Please see shift progressive note about this [name].  This patient is Anuric. This patient has a high BMI and shows no signs of eagerness to obtain optimal weight.    Ahnika Hannibal Charity fundraiser, Scientist, research (physical sciences)

## 2023-10-31 NOTE — TOC Progression Note (Signed)
 Transition of Care [facility]) - Progression Note    Patient Details  Name: Edgar Brewer MRN: [id number] Date of Birth: 25-Jun-1960  [facility] CM/SW Contact  Lendia Dais, [place] Phone Number: 10/31/2023, 10:58 AM  Clinical Narrative:  CSW spoke to pt's mother [name] at bedside.  Pt gave verbal consent for mother and sister to choose a SNF. Hilda chose [facility]. [name] inquired about transportation to HD center and transportation to upcoming doctor's appointment. CSW stated that they will provide transportation to HD and will check about doctors appointment.  CSW spoke to [place] [facility] and stated they are able to provide transport to HD center and will start insurance auth for the pt.   CSW will continue to monitor.                       Expected Discharge Plan and Services                                               Social Drivers of Health (SDOH) Interventions SDOH Screenings   Food Insecurity: No Food Insecurity (10/20/2023)  Housing: Low Risk  (10/20/2023)  Transportation Needs: No Transportation Needs (10/20/2023)  Utilities: Not At Risk (10/20/2023)  Depression (PHQ2-9): Low Risk  (09/19/2023)  Financial Resource Strain: Low Risk  (04/19/2022)  Physical Activity: Inactive (04/19/2022)  Social Connections: Unknown (04/19/2022)  Stress: No Stress Concern Present (04/19/2022)  Tobacco Use: Medium Risk (10/23/2023)    Readmission Risk Interventions    10/20/2023    4:09 PM  Readmission Risk Prevention Plan  Transportation Screening Complete  PCP or Specialist Appt within 3-5 Days Complete  HRI or Home Care Consult Complete  Social Work Consult for Recovery Care Planning/Counseling Complete  Palliative Care Screening Not Applicable

## 2023-10-31 NOTE — Progress Notes (Signed)
 [facility] Progress Note   Subjective:    Seen and examined patient at bedside. Patient's mother also at bedside. Tolerated yesterday's HD with net UF 2L. No issues. Next HD 10/8.  Objective Vitals:   10/30/23 1643 10/30/23 2027 10/31/23 0427 10/31/23 0737  BP: (!) 92/53 (!) 93/57 (!) 124/51 (!) 103/55  Pulse: 99 86 88 93  Resp: 18 18 18 19   Temp: 99.2 F (37.3 C) 98.7 F (37.1 C) 99.1 F (37.3 C) 99.5 F (37.5 C)  TempSrc: Oral Oral Oral Oral  SpO2: 98% 92% 92% 95%  Weight:      Height:       Physical Exam General: alert, nad  Heart: RRR Resp: nml WOB on RA Abdomen: non distended Extremities: no LE edema, L foot dressed Dialysis Access: LU AVF +bruit   Filed Weights   10/27/23 1803 10/30/23 0843 10/30/23 1243  Weight: 97 kg 95.9 kg 93.9 kg    Intake/Output Summary (Last 24 hours) at 10/31/2023 1052 Last data filed at 10/31/2023 0830 Gross per 24 hour  Intake 360 ml  Output 2000 ml  Net -1640 ml    Additional Objective Labs: Basic Metabolic Panel: Recent Labs  Lab 10/25/23 0917 10/26/23 0206 10/28/23 1542 10/29/23 0310 10/30/23 0535  NA 134*   < > 132* 131* 132*  K 5.5*   < > 4.8 5.2* 5.6*  CL 95*   < > 89* 89* 91*  CO2 22   < > 26 25 24   GLUCOSE 231*   < > 137* 118* 116*  BUN 89*   < > 50* 55* 71*  CREATININE 12.94*   < > 2.57* 11.05* 13.90*  CALCIUM  9.1   < > 9.9 9.4 9.4  PHOS 8.6*  --   --   --   --    < > = values in this interval not displayed.   Liver Function Tests: Recent Labs  Lab 10/25/23 0917 10/28/23 1542  AST  --  16  ALT  --  15  ALKPHOS  --  215*  BILITOT  --  0.6  PROT  --  6.9  ALBUMIN 2.4* 2.6*   No results for input(s): LIPASE, AMYLASE in the last 168 hours. CBC: Recent Labs  Lab 10/26/23 0206 10/27/23 0324 10/28/23 0751 10/28/23 1542 10/30/23 0900  WBC 9.9 10.6* 12.9* 11.5* 11.9*  NEUTROABS  --   --   --  9.2*  --   HGB 9.9* 10.2* 11.3* 10.6* 9.9*  HCT 30.4* 30.9* 34.2* 32.1* 30.6*  MCV  95.0 93.9 94.0 94.7 95.6  PLT 272 289 325 279 271   Blood Culture    Component Value Date/Time   SDES TISSUE LEFT FOOT 10/23/2023 1640   SPECREQUEST NONE 10/23/2023 1640   CULT  10/23/2023 1640    MODERATE ENTEROCOCCUS FAECALIS FEW STENOTROPHOMONAS MALTOPHILIA FEW STAPHYLOCOCCUS AUREUS NO ANAEROBES ISOLATED Sent to [facility] for further susceptibility testing. Performed at [facility], [address] [place], [place] [address]    REPTSTATUS PENDING 10/23/2023 1640    Cardiac Enzymes: No results for input(s): CKTOTAL, CKMB, CKMBINDEX, TROPONINI in the last 168 hours. CBG: Recent Labs  Lab 10/30/23 0741 10/30/23 1359 10/30/23 1646 10/30/23 2027 10/31/23 0728  GLUCAP 102* 102* 234* 173* 126*   Iron Studies: No results for input(s): IRON, TIBC, TRANSFERRIN, FERRITIN in the last 72 hours. Lab Results  Component Value Date   INR 1.1 10/19/2023   INR 1.0 11/14/2018   INR 1.0 11/01/2018  Studies/Results: ECHOCARDIOGRAM COMPLETE Result Date: 10/31/2023    ECHOCARDIOGRAM REPORT   Patient Name:   Edgar Brewer Date of Exam: 10/31/2023 Medical Rec #:  [id number]    Height:       68.0 in Accession #:    [id number]   Weight:       207.0 lb Date of Birth:  1960/09/07    BSA:          2.074 m Patient Age:    63 years     BP:           124/51 mmHg Patient Gender: M            HR:           96 bpm. Exam Location:  Inpatient Procedure: 2D Echo, Cardiac Doppler and Color Doppler (Both Spectral and Color            Flow Doppler were utilized during procedure). Indications:    Abnormal EKG R94.31  History:        Patient has prior history of Echocardiogram examinations, most                 recent 11/17/2023. CHF, Stroke; Risk Factors:Hypertension and                 Diabetes. H/O Pulmonary hypertension, ESRD, Bradycardia, Aortic                 stenosis.  Sonographer:    BERNARDA ROCKS Referring Phys: [id number] AMRIT ADHIKARI IMPRESSIONS  1. Left ventricular ejection fraction, by  estimation, is 60 to 65%. The left ventricle has normal function. The left ventricle has no regional wall motion abnormalities. Left ventricular diastolic parameters were normal. There is the interventricular septum is flattened in diastole ('D' shaped left ventricle), consistent with right ventricular volume overload.  2. Right ventricular systolic function is normal. The right ventricular size is normal.  3. There is a 1.1 x 1.5 calcified mobile echodensity attached to the base and medial aspect (A1) of the anterior MV leaflet. . The mitral valve is normal in structure. No evidence of mitral valve regurgitation. No evidence of mitral stenosis.  4. The aortic valve is normal in structure. There is moderate calcification of the aortic valve. Aortic valve regurgitation is not visualized. Mild to moderate aortic valve stenosis. Aortic valve area, by VTI measures 1.55 cm. Aortic valve mean gradient measures 15.0 mmHg. Aortic valve Vmax measures 2.83 m/s.  5. The inferior vena cava is normal in size with greater than 50% respiratory variability, suggesting right atrial pressure of 3 mmHg. FINDINGS  Left Ventricle: Left ventricular ejection fraction, by estimation, is 60 to 65%. The left ventricle has normal function. The left ventricle has no regional wall motion abnormalities. The left ventricular internal cavity size was normal in size. There is  no left ventricular hypertrophy. The interventricular septum is flattened in diastole ('D' shaped left ventricle), consistent with right ventricular volume overload. Left ventricular diastolic parameters were normal. Right Ventricle: The right ventricular size is normal. No increase in right ventricular wall thickness. Right ventricular systolic function is normal. Left Atrium: Left atrial size was normal in size. Right Atrium: Right atrial size was normal in size. Pericardium: There is no evidence of pericardial effusion. Mitral Valve: There is a 1.1 x 1.5 calcified mobile  echodensity attached to the base and medial aspect (A1) of the anterior MV leaflet. The mitral valve is normal in structure. No evidence of mitral valve regurgitation. No evidence  of mitral valve stenosis. MV peak gradient, 11.3 mmHg. The mean mitral valve gradient is 4.0 mmHg. Tricuspid Valve: The tricuspid valve is normal in structure. Tricuspid valve regurgitation is trivial. No evidence of tricuspid stenosis. Aortic Valve: The aortic valve is normal in structure. There is moderate calcification of the aortic valve. Aortic valve regurgitation is not visualized. Mild to moderate aortic stenosis is present. Aortic valve mean gradient measures 15.0 mmHg. Aortic valve peak gradient measures 32.0 mmHg. Aortic valve area, by VTI measures 1.55 cm. Pulmonic Valve: The pulmonic valve was normal in structure. Pulmonic valve regurgitation is not visualized. No evidence of pulmonic stenosis. Aorta: The aortic root is normal in size and structure. Venous: The inferior vena cava is normal in size with greater than 50% respiratory variability, suggesting right atrial pressure of 3 mmHg. IAS/Shunts: No atrial level shunt detected by color flow Doppler.  LEFT VENTRICLE PLAX 2D LVIDd:         4.30 cm      Diastology LVIDs:         3.00 cm      LV e' medial:    10.10 cm/s LV PW:         0.90 cm      LV E/e' medial:  15.0 LV IVS:        0.90 cm      LV e' lateral:   14.00 cm/s LVOT diam:     2.20 cm      LV E/e' lateral: 10.8 LV SV:         75 LV SV Index:   36 LVOT Area:     3.80 cm  LV Volumes (MOD) LV vol d, MOD A2C: 164.0 ml LV vol d, MOD A4C: 189.0 ml LV vol s, MOD A2C: 49.3 ml LV vol s, MOD A4C: 56.7 ml LV SV MOD A2C:     114.7 ml LV SV MOD A4C:     189.0 ml LV SV MOD BP:      124.6 ml RIGHT VENTRICLE             IVC RV Basal diam:  3.20 cm     IVC diam: 1.90 cm RV S prime:     12.90 cm/s TAPSE (M-mode): 1.4 cm LEFT ATRIUM             Index        RIGHT ATRIUM           Index LA diam:        3.90 cm 1.88 cm/m   RA Area:      14.40 cm LA Vol (A2C):   42.0 ml 20.25 ml/m  RA Volume:   34.70 ml  16.73 ml/m LA Vol (A4C):   71.8 ml 34.62 ml/m LA Biplane Vol: 57.0 ml 27.48 ml/m  AORTIC VALVE                     PULMONIC VALVE AV Area (Vmax):    1.57 cm      PV Vmax:          1.23 m/s AV Area (Vmean):   1.55 cm      PV Peak grad:     6.1 mmHg AV Area (VTI):     1.55 cm      PR End Diast Vel: 1.07 msec AV Vmax:           283.00 cm/s AV Vmean:          180.000  cm/s AV VTI:            0.485 m AV Peak Grad:      32.0 mmHg AV Mean Grad:      15.0 mmHg LVOT Vmax:         117.00 cm/s LVOT Vmean:        73.300 cm/s LVOT VTI:          0.198 m LVOT/AV VTI ratio: 0.41  AORTA Ao Root diam: 3.00 cm Ao Asc diam:  3.20 cm MITRAL VALVE MV Area (PHT): 5.84 cm     SHUNTS MV Area VTI:   2.47 cm     Systemic VTI:  0.20 m MV Peak grad:  11.3 mmHg    Systemic Diam: 2.20 cm MV Mean grad:  4.0 mmHg MV Vmax:       1.68 m/s MV Vmean:      88.2 cm/s MV Decel Time: 130 msec MV E velocity: 151.00 cm/s MV A velocity: 41.40 cm/s MV E/A ratio:  3.65 Franck Azobou Tonleu Electronically signed by Joelle Cedars Tonleu Signature Date/Time: 10/31/2023/9:23:36 AM    Final     Medications:   (feeding supplement) PROSource Plus  30 mL Oral BID BM   amoxicillin -clavulanate  1 tablet Oral QHS   aspirin  EC  81 mg Oral Daily   Chlorhexidine  Gluconate Cloth  6 each Topical Q0600   cinacalcet   60 mg Oral Q T,Th,Sat-1800   darbepoetin (ARANESP ) injection - DIALYSIS  60 mcg Subcutaneous Q Fri-1800   gabapentin   100 mg Oral TID   heparin  injection (subcutaneous)  5,000 Units Subcutaneous Q8H   insulin  aspart  0-6 Units Subcutaneous TID WC   insulin  aspart  4 Units Subcutaneous TID WC   insulin  glargine  50 Units Subcutaneous Daily   multivitamin  1 tablet Oral QHS   mupirocin  cream   Topical QODAY   pantoprazole   40 mg Oral Daily   polyethylene glycol  17 g Oral Daily   rosuvastatin   10 mg Oral Daily   senna-docusate  1 tablet Oral BID   sevelamer  carbonate   1,600 mg Oral TID WC    Dialysis Orders: MWF - NW 4hr, 400/A1.5, EDW 96.8kg, 2K/2.5Ca bath, AVF, Heparin  2600 - no ESA, Hgb > goal - Calcitriol 1.75 three times per week  Assessment/Plan: Sepsis/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  ESRD.  Continue usual MWF HD - Next HD 10/8. Hypertension/volume. BP controlled, min LE edema, but low Na - UF as tolerated. Anemia. Hgb 10.6. No indication for ESA at this time.  Metabolic bone disease.  CorrCa high sided, VDRA on hold. Phos above goal. Continue home binders/cinacalcet . Nutrition: Alb low, continue supplements.  Renal diet w/fluid restrictions.  T2DM: Insulin  per primary. DVT prophylaxis: Prefer heparin  over Lovenox  in ESRD patient, AC on hold s/p surgery. Dispo: SNF recommended by PT Bradycardia - HR to 30s on 10/5. Cardiology consulted. Report Wenckeback conduction.  No additional work up indicated. Recommend K>4 and Mg>2, avoid AV nodal blockers.   Charmaine Piety, NP [facility] 10/31/2023,10:52 AM  LOS: 12 days

## 2023-10-31 NOTE — Progress Notes (Signed)
 Case discussed with CSW. Pt for snf placement and snf to start insurance auth today. Contacted inpt HD unit and requested 1st shift HD tomorrow if possible in the event pt stable for d/c and snf auth received tomorrow. Will assist as needed.   Randine Mungo Dialysis Navigator [phone]

## 2023-11-01 ENCOUNTER — Inpatient Hospital Stay (HOSPITAL_COMMUNITY): Payer: Medicare (Managed Care) | Admitting: Anesthesiology

## 2023-11-01 ENCOUNTER — Encounter (HOSPITAL_COMMUNITY)
Admission: EM | Disposition: A | Discharge status: Skilled Nursing Facility | Payer: Self-pay | Source: Home / Self Care | Attending: Internal Medicine

## 2023-11-01 ENCOUNTER — Inpatient Hospital Stay (HOSPITAL_COMMUNITY): Payer: Medicare (Managed Care)

## 2023-11-01 DIAGNOSIS — B9689 Other specified bacterial agents as the cause of diseases classified elsewhere: Secondary | ICD-10-CM

## 2023-11-01 DIAGNOSIS — I5032 Chronic diastolic (congestive) heart failure: Secondary | ICD-10-CM

## 2023-11-01 DIAGNOSIS — I340 Nonrheumatic mitral (valve) insufficiency: Secondary | ICD-10-CM | POA: Diagnosis not present

## 2023-11-01 DIAGNOSIS — Z89432 Acquired absence of left foot: Secondary | ICD-10-CM

## 2023-11-01 DIAGNOSIS — T8142XD Infection following a procedure, deep incisional surgical site, subsequent encounter: Secondary | ICD-10-CM | POA: Diagnosis not present

## 2023-11-01 DIAGNOSIS — I132 Hypertensive heart and chronic kidney disease with heart failure and with stage 5 chronic kidney disease, or end stage renal disease: Secondary | ICD-10-CM | POA: Diagnosis not present

## 2023-11-01 DIAGNOSIS — I480 Paroxysmal atrial fibrillation: Secondary | ICD-10-CM

## 2023-11-01 DIAGNOSIS — R001 Bradycardia, unspecified: Secondary | ICD-10-CM | POA: Diagnosis not present

## 2023-11-01 DIAGNOSIS — I330 Acute and subacute infective endocarditis: Secondary | ICD-10-CM | POA: Diagnosis not present

## 2023-11-01 DIAGNOSIS — I350 Nonrheumatic aortic (valve) stenosis: Secondary | ICD-10-CM

## 2023-11-01 DIAGNOSIS — A419 Sepsis, unspecified organism: Secondary | ICD-10-CM | POA: Diagnosis not present

## 2023-11-01 DIAGNOSIS — R931 Abnormal findings on diagnostic imaging of heart and coronary circulation: Secondary | ICD-10-CM | POA: Diagnosis not present

## 2023-11-01 DIAGNOSIS — N186 End stage renal disease: Secondary | ICD-10-CM

## 2023-11-01 HISTORY — PX: TRANSESOPHAGEAL ECHOCARDIOGRAM (CATH LAB): EP1270

## 2023-11-01 LAB — CBC WITH DIFFERENTIAL/PLATELET
Abs Immature Granulocytes: 0.08 K/uL — ABNORMAL HIGH (ref 0.00–0.07)
Abs Immature Granulocytes: 0.12 K/uL — ABNORMAL HIGH (ref 0.00–0.07)
Basophils Absolute: 0 K/uL (ref 0.0–0.1)
Basophils Absolute: 0.1 K/uL (ref 0.0–0.1)
Basophils Relative: 0 %
Basophils Relative: 1 %
Eosinophils Absolute: 0.2 K/uL (ref 0.0–0.5)
Eosinophils Absolute: 0.1 K/uL (ref 0.0–0.5)
Eosinophils Relative: 2 %
Eosinophils Relative: 1 %
HCT: 30.1 % — ABNORMAL LOW (ref 39.0–52.0)
HCT: 35.7 % — ABNORMAL LOW (ref 39.0–52.0)
Hemoglobin: 9.7 g/dL — ABNORMAL LOW (ref 13.0–17.0)
Hemoglobin: 11.3 g/dL — ABNORMAL LOW (ref 13.0–17.0)
Immature Granulocytes: 1 %
Immature Granulocytes: 1 %
Lymphocytes Relative: 11 %
Lymphocytes Relative: 3 %
Lymphs Abs: 1.1 K/uL (ref 0.7–4.0)
Lymphs Abs: 0.7 K/uL (ref 0.7–4.0)
MCH: 30.9 pg (ref 26.0–34.0)
MCH: 30.7 pg (ref 26.0–34.0)
MCHC: 32.2 g/dL (ref 30.0–36.0)
MCHC: 31.7 g/dL (ref 30.0–36.0)
MCV: 95.9 fL (ref 80.0–100.0)
MCV: 97 fL (ref 80.0–100.0)
Monocytes Absolute: 0.7 K/uL (ref 0.1–1.0)
Monocytes Absolute: 0.8 K/uL (ref 0.1–1.0)
Monocytes Relative: 7 %
Monocytes Relative: 4 %
Neutro Abs: 8 K/uL — ABNORMAL HIGH (ref 1.7–7.7)
Neutro Abs: 19 K/uL — ABNORMAL HIGH (ref 1.7–7.7)
Neutrophils Relative %: 79 %
Neutrophils Relative %: 90 %
Platelets: 281 K/uL (ref 150–400)
Platelets: 332 K/uL (ref 150–400)
RBC: 3.14 MIL/uL — ABNORMAL LOW (ref 4.22–5.81)
RBC: 3.68 MIL/uL — ABNORMAL LOW (ref 4.22–5.81)
RDW: 14.4 % (ref 11.5–15.5)
RDW: 14.6 % (ref 11.5–15.5)
WBC: 10.1 K/uL (ref 4.0–10.5)
WBC: 20.8 K/uL — ABNORMAL HIGH (ref 4.0–10.5)
nRBC: 0 % (ref 0.0–0.2)
nRBC: 0 % (ref 0.0–0.2)

## 2023-11-01 LAB — PROTIME-INR
INR: 1.1 (ref 0.8–1.2)
Prothrombin Time: 14.3 s (ref 11.4–15.2)

## 2023-11-01 LAB — ECHO TEE
AV Mean grad: 18 mmHg
AV Peak grad: 17.6 mmHg
Ao pk vel: 2.1 m/s

## 2023-11-01 LAB — COMPREHENSIVE METABOLIC PANEL WITH GFR
ALT: 16 U/L (ref 0–44)
AST: 21 U/L (ref 15–41)
Albumin: 2.9 g/dL — ABNORMAL LOW (ref 3.5–5.0)
Alkaline Phosphatase: 248 U/L — ABNORMAL HIGH (ref 38–126)
Anion gap: 18 — ABNORMAL HIGH (ref 5–15)
BUN: 33 mg/dL — ABNORMAL HIGH (ref 8–23)
CO2: 26 mmol/L (ref 22–32)
Calcium: 9.7 mg/dL (ref 8.9–10.3)
Chloride: 90 mmol/L — ABNORMAL LOW (ref 98–111)
Creatinine, Ser: 8.01 mg/dL — ABNORMAL HIGH (ref 0.61–1.24)
GFR, Estimated: 7 mL/min — ABNORMAL LOW (ref >60)
Glucose, Bld: 133 mg/dL — ABNORMAL HIGH (ref 70–99)
Potassium: 4.5 mmol/L (ref 3.5–5.1)
Sodium: 134 mmol/L — ABNORMAL LOW (ref 135–145)
Total Bilirubin: 1.1 mg/dL (ref 0.0–1.2)
Total Protein: 8.3 g/dL — ABNORMAL HIGH (ref 6.5–8.1)

## 2023-11-01 LAB — RENAL FUNCTION PANEL
Albumin: 2.5 g/dL — ABNORMAL LOW (ref 3.5–5.0)
Anion gap: 17 — ABNORMAL HIGH (ref 5–15)
BUN: 66 mg/dL — ABNORMAL HIGH (ref 8–23)
CO2: 24 mmol/L (ref 22–32)
Calcium: 9.2 mg/dL (ref 8.9–10.3)
Chloride: 93 mmol/L — ABNORMAL LOW (ref 98–111)
Creatinine, Ser: 12.73 mg/dL — ABNORMAL HIGH (ref 0.61–1.24)
GFR, Estimated: 4 mL/min — ABNORMAL LOW (ref >60)
Glucose, Bld: 224 mg/dL — ABNORMAL HIGH (ref 70–99)
Phosphorus: 7.9 mg/dL — ABNORMAL HIGH (ref 2.5–4.6)
Potassium: 5.5 mmol/L — ABNORMAL HIGH (ref 3.5–5.1)
Sodium: 134 mmol/L — ABNORMAL LOW (ref 135–145)

## 2023-11-01 LAB — C-REACTIVE PROTEIN: CRP: 2.3 mg/dL — ABNORMAL HIGH (ref <1.0)

## 2023-11-01 LAB — LACTIC ACID, PLASMA
Lactic Acid, Venous: 1.5 mmol/L (ref 0.5–1.9)
Lactic Acid, Venous: 1 mmol/L (ref 0.5–1.9)

## 2023-11-01 LAB — GLUCOSE, CAPILLARY
Glucose-Capillary: 162 mg/dL — ABNORMAL HIGH (ref 70–99)
Glucose-Capillary: 134 mg/dL — ABNORMAL HIGH (ref 70–99)
Glucose-Capillary: 120 mg/dL — ABNORMAL HIGH (ref 70–99)
Glucose-Capillary: 126 mg/dL — ABNORMAL HIGH (ref 70–99)

## 2023-11-01 LAB — APTT: aPTT: 33 s (ref 24–36)

## 2023-11-01 LAB — SEDIMENTATION RATE: Sed Rate: 116 mm/h — ABNORMAL HIGH (ref 0–16)

## 2023-11-01 SURGERY — TRANSESOPHAGEAL ECHOCARDIOGRAM (TEE) (CATHLAB)
Anesthesia: Monitor Anesthesia Care

## 2023-11-01 MED ORDER — ACETAMINOPHEN 650 MG RE SUPP
650.0000 mg | Freq: Four times a day (QID) | RECTAL | Status: DC | PRN
  Administered 2023-11-01: 650 mg via RECTAL
  Filled 2023-11-01: qty 1

## 2023-11-01 MED ORDER — HYDROCODONE-ACETAMINOPHEN 5-325 MG PO TABS
1.0000 | ORAL_TABLET | Freq: Four times a day (QID) | ORAL | Status: DC | PRN
  Administered 2023-11-03: 1 via ORAL
  Filled 2023-11-01: qty 1

## 2023-11-01 MED ORDER — ACETAMINOPHEN 500 MG PO TABS: 1000.0000 mg | ORAL_TABLET | Freq: Four times a day (QID) | ORAL | Status: DC | PRN

## 2023-11-01 MED ORDER — MIDODRINE HCL 5 MG PO TABS
5.0000 mg | ORAL_TABLET | Freq: Once | ORAL | Status: AC
  Administered 2023-11-01: 5 mg via ORAL
  Filled 2023-11-01: qty 1

## 2023-11-01 MED ORDER — GLYCOPYRROLATE 0.2 MG/ML IJ SOLN
INTRAMUSCULAR | Status: DC | PRN
  Administered 2023-11-01: .1 mg via INTRAVENOUS

## 2023-11-01 MED ORDER — PROPOFOL 10 MG/ML IV BOLUS
INTRAVENOUS | Status: DC | PRN
Start: 2023-11-01 — End: 2023-11-01
  Administered 2023-11-01: 30 mg via INTRAVENOUS
  Administered 2023-11-01: 110 ug/kg/min via INTRAVENOUS

## 2023-11-01 MED ORDER — ORAL CARE MOUTH RINSE: 15.0000 mL | OROMUCOSAL | Status: DC | PRN

## 2023-11-01 MED ORDER — LEVOFLOXACIN IN D5W 750 MG/150ML IV SOLN
750.0000 mg | Freq: Once | INTRAVENOUS | Status: AC
  Administered 2023-11-01: 750 mg via INTRAVENOUS
  Filled 2023-11-01: qty 150

## 2023-11-01 MED ORDER — PHENYLEPHRINE 80 MCG/ML (10ML) SYRINGE FOR IV PUSH (FOR BLOOD PRESSURE SUPPORT)
PREFILLED_SYRINGE | INTRAVENOUS | Status: DC | PRN
  Administered 2023-11-01: 120 ug via INTRAVENOUS
  Administered 2023-11-01: 80 ug via INTRAVENOUS
  Administered 2023-11-01: 120 ug via INTRAVENOUS
  Administered 2023-11-01: 80 ug via INTRAVENOUS

## 2023-11-01 MED ORDER — VANCOMYCIN HCL IN DEXTROSE 1-5 GM/200ML-% IV SOLN: 1000.0000 mg | INTRAVENOUS | Status: DC

## 2023-11-01 MED ORDER — VANCOMYCIN HCL 2000 MG/400ML IV SOLN
2000.0000 mg | Freq: Once | INTRAVENOUS | Status: AC
  Administered 2023-11-01: 2000 mg via INTRAVENOUS
  Filled 2023-11-01: qty 400

## 2023-11-01 MED ORDER — LEVOFLOXACIN IN D5W 500 MG/100ML IV SOLN
500.0000 mg | INTRAVENOUS | Status: DC
  Administered 2023-11-03: 500 mg via INTRAVENOUS
  Filled 2023-11-01 (×2): qty 100

## 2023-11-01 MED ORDER — GABAPENTIN 100 MG PO CAPS
100.0000 mg | ORAL_CAPSULE | Freq: Three times a day (TID) | ORAL | Status: DC
  Administered 2023-11-02 – 2023-11-07 (×17): 100 mg via ORAL
  Filled 2023-11-01 (×17): qty 1

## 2023-11-01 MED ORDER — ACETAMINOPHEN 650 MG RE SUPP: 650.0000 mg | Freq: Four times a day (QID) | RECTAL | Status: DC | PRN

## 2023-11-01 MED ORDER — ACETAMINOPHEN 500 MG PO TABS
1000.0000 mg | ORAL_TABLET | Freq: Four times a day (QID) | ORAL | Status: DC | PRN
  Administered 2023-11-06: 1000 mg via ORAL
  Filled 2023-11-01: qty 2

## 2023-11-01 MED ORDER — SODIUM CHLORIDE 0.9 % IV BOLUS
500.0000 mL | Freq: Once | INTRAVENOUS | Status: AC
  Administered 2023-11-01: 500 mL via INTRAVENOUS

## 2023-11-01 MED ORDER — LIDOCAINE 2% (20 MG/ML) 5 ML SYRINGE
INTRAMUSCULAR | Status: DC | PRN
  Administered 2023-11-01: 50 mg via INTRAVENOUS

## 2023-11-01 MED ORDER — ALBUTEROL SULFATE (2.5 MG/3ML) 0.083% IN NEBU
2.5000 mg | INHALATION_SOLUTION | RESPIRATORY_TRACT | Status: DC | PRN
  Administered 2023-11-01: 2.5 mg via RESPIRATORY_TRACT

## 2023-11-01 NOTE — Progress Notes (Signed)
 Subjective:  No complaints. Pain control LLE good   Objective:  Vitals:   10/31/23 2017 11/01/23 0453 11/01/23 0600 11/01/23 0831  BP: (S) (!) 114/54 (S) (!) 113/55  (!) 106/51  Pulse: 91 81  80  Resp: 18 20  18   Temp: 98.5 F (36.9 C) 97.8 F (36.6 C)  (!) 97.5 F (36.4 C)  TempSrc:  Oral  Oral  SpO2: 93% 96%  97%  Weight:   94.3 kg   Height:        Intake/Output from previous day:  Intake/Output Summary (Last 24 hours) at 11/01/2023 0857 Last data filed at 11/01/2023 0006 Gross per 24 hour  Intake 600 ml  Output 0 ml  Net 600 ml    Physical Exam:  Chronically ill male no distress Lungs clear AS murmur Abdomen benign No edema Post amputation left forefoot Left brachiocephalic fistula   Lab Results: Basic Metabolic Panel: Recent Labs    10/30/23 0535 11/01/23 0205  NA 132* 134*  K 5.6* 5.5*  CL 91* 93*  CO2 24 24  GLUCOSE 116* 224*  BUN 71* 66*  CREATININE 13.90* 12.73*  CALCIUM  9.4 9.2  MG 2.4  --   PHOS  --  7.9*   Liver Function Tests: Recent Labs    11/01/23 0205  ALBUMIN 2.5*   No results for input(s): LIPASE, AMYLASE in the last 72 hours. CBC: Recent Labs    10/30/23 0900 11/01/23 0205  WBC 11.9* 10.1  NEUTROABS  --  8.0*  HGB 9.9* 9.7*  HCT 30.6* 30.1*  MCV 95.6 95.9  PLT 271 281    Thyroid  Function Tests: No results for input(s): TSH, T4TOTAL, T3FREE, THYROIDAB in the last 72 hours.  Invalid input(s): FREET3  Anemia Panel: No results for input(s): VITAMINB12, FOLATE, FERRITIN, TIBC, IRON, RETICCTPCT in the last 72 hours.  Imaging: ECHOCARDIOGRAM COMPLETE Result Date: 10/31/2023    ECHOCARDIOGRAM REPORT   Patient Name:   Edgar Brewer Date of Exam: 10/31/2023 Medical Rec #:  [id number]    Height:       68.0 in Accession #:    [id number]   Weight:       207.0 lb Date of Birth:  Apr 09, 1960    BSA:          2.074 m Patient Age:    63 years     BP:           124/51 mmHg Patient Gender: M            HR:            96 bpm. Exam Location:  Inpatient Procedure: 2D Echo, Cardiac Doppler and Color Doppler (Both Spectral and Color            Flow Doppler were utilized during procedure). Indications:    Abnormal EKG R94.31  History:        Patient has prior history of Echocardiogram examinations, most                 recent 11/17/2023. CHF, Stroke; Risk Factors:Hypertension and                 Diabetes. H/O Pulmonary hypertension, ESRD, Bradycardia, Aortic                 stenosis.  Sonographer:    BERNARDA ROCKS Referring Phys: [id number] AMRIT ADHIKARI IMPRESSIONS  1. Left ventricular ejection fraction, by estimation, is 60 to 65%. The left ventricle has normal function. The  left ventricle has no regional wall motion abnormalities. Left ventricular diastolic parameters were normal. There is the interventricular septum is flattened in diastole ('D' shaped left ventricle), consistent with right ventricular volume overload.  2. Right ventricular systolic function is normal. The right ventricular size is normal.  3. There is a 1.1 x 1.5 calcified mobile echodensity attached to the base and medial aspect (A1) of the anterior MV leaflet. . The mitral valve is normal in structure. No evidence of mitral valve regurgitation. No evidence of mitral stenosis.  4. The aortic valve is normal in structure. There is moderate calcification of the aortic valve. Aortic valve regurgitation is not visualized. Mild to moderate aortic valve stenosis. Aortic valve area, by VTI measures 1.55 cm. Aortic valve mean gradient measures 15.0 mmHg. Aortic valve Vmax measures 2.83 m/s.  5. The inferior vena cava is normal in size with greater than 50% respiratory variability, suggesting right atrial pressure of 3 mmHg. FINDINGS  Left Ventricle: Left ventricular ejection fraction, by estimation, is 60 to 65%. The left ventricle has normal function. The left ventricle has no regional wall motion abnormalities. The left ventricular internal cavity size was  normal in size. There is  no left ventricular hypertrophy. The interventricular septum is flattened in diastole ('D' shaped left ventricle), consistent with right ventricular volume overload. Left ventricular diastolic parameters were normal. Right Ventricle: The right ventricular size is normal. No increase in right ventricular wall thickness. Right ventricular systolic function is normal. Left Atrium: Left atrial size was normal in size. Right Atrium: Right atrial size was normal in size. Pericardium: There is no evidence of pericardial effusion. Mitral Valve: There is a 1.1 x 1.5 calcified mobile echodensity attached to the base and medial aspect (A1) of the anterior MV leaflet. The mitral valve is normal in structure. No evidence of mitral valve regurgitation. No evidence of mitral valve stenosis. MV peak gradient, 11.3 mmHg. The mean mitral valve gradient is 4.0 mmHg. Tricuspid Valve: The tricuspid valve is normal in structure. Tricuspid valve regurgitation is trivial. No evidence of tricuspid stenosis. Aortic Valve: The aortic valve is normal in structure. There is moderate calcification of the aortic valve. Aortic valve regurgitation is not visualized. Mild to moderate aortic stenosis is present. Aortic valve mean gradient measures 15.0 mmHg. Aortic valve peak gradient measures 32.0 mmHg. Aortic valve area, by VTI measures 1.55 cm. Pulmonic Valve: The pulmonic valve was normal in structure. Pulmonic valve regurgitation is not visualized. No evidence of pulmonic stenosis. Aorta: The aortic root is normal in size and structure. Venous: The inferior vena cava is normal in size with greater than 50% respiratory variability, suggesting right atrial pressure of 3 mmHg. IAS/Shunts: No atrial level shunt detected by color flow Doppler.  LEFT VENTRICLE PLAX 2D LVIDd:         4.30 cm      Diastology LVIDs:         3.00 cm      LV e' medial:    10.10 cm/s LV PW:         0.90 cm      LV E/e' medial:  15.0 LV IVS:         0.90 cm      LV e' lateral:   14.00 cm/s LVOT diam:     2.20 cm      LV E/e' lateral: 10.8 LV SV:         75 LV SV Index:   36 LVOT Area:  3.80 cm  LV Volumes (MOD) LV vol d, MOD A2C: 164.0 ml LV vol d, MOD A4C: 189.0 ml LV vol s, MOD A2C: 49.3 ml LV vol s, MOD A4C: 56.7 ml LV SV MOD A2C:     114.7 ml LV SV MOD A4C:     189.0 ml LV SV MOD BP:      124.6 ml RIGHT VENTRICLE             IVC RV Basal diam:  3.20 cm     IVC diam: 1.90 cm RV S prime:     12.90 cm/s TAPSE (M-mode): 1.4 cm LEFT ATRIUM             Index        RIGHT ATRIUM           Index LA diam:        3.90 cm 1.88 cm/m   RA Area:     14.40 cm LA Vol (A2C):   42.0 ml 20.25 ml/m  RA Volume:   34.70 ml  16.73 ml/m LA Vol (A4C):   71.8 ml 34.62 ml/m LA Biplane Vol: 57.0 ml 27.48 ml/m  AORTIC VALVE                     PULMONIC VALVE AV Area (Vmax):    1.57 cm      PV Vmax:          1.23 m/s AV Area (Vmean):   1.55 cm      PV Peak grad:     6.1 mmHg AV Area (VTI):     1.55 cm      PR End Diast Vel: 1.07 msec AV Vmax:           283.00 cm/s AV Vmean:          180.000 cm/s AV VTI:            0.485 m AV Peak Grad:      32.0 mmHg AV Mean Grad:      15.0 mmHg LVOT Vmax:         117.00 cm/s LVOT Vmean:        73.300 cm/s LVOT VTI:          0.198 m LVOT/AV VTI ratio: 0.41  AORTA Ao Root diam: 3.00 cm Ao Asc diam:  3.20 cm MITRAL VALVE MV Area (PHT): 5.84 cm     SHUNTS MV Area VTI:   2.47 cm     Systemic VTI:  0.20 m MV Peak grad:  11.3 mmHg    Systemic Diam: 2.20 cm MV Mean grad:  4.0 mmHg MV Vmax:       1.68 m/s MV Vmean:      88.2 cm/s MV Decel Time: 130 msec MV E velocity: 151.00 cm/s MV A velocity: 41.40 cm/s MV E/A ratio:  3.65 Franck Azobou Tonleu Electronically signed by Joelle Cedars Tonleu Signature Date/Time: 10/31/2023/9:23:36 AM    Final     Cardiac Studies:  ECG: SR rate 69 wenkebach   Telemetry: SR rates 80-90  Echo: Reviewed 10/7 EF 60-65% 1.1 x 1.5 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  mild/mod AS    Medications:    (feeding supplement) PROSource Plus  30 mL Oral BID BM   aspirin  EC  81 mg Oral Daily   Chlorhexidine  Gluconate Cloth  6 each Topical Q0600   cinacalcet   60 mg Oral Q T,Th,Sat-1800   darbepoetin (  ARANESP ) injection - DIALYSIS  60 mcg Subcutaneous Q Fri-1800   gabapentin   100 mg Oral TID   heparin  injection (subcutaneous)  5,000 Units Subcutaneous Q8H   insulin  aspart  0-6 Units Subcutaneous TID WC   insulin  aspart  4 Units Subcutaneous TID WC   insulin  glargine  50 Units Subcutaneous Daily   multivitamin  1 tablet Oral QHS   mupirocin  cream   Topical QODAY   pantoprazole   40 mg Oral Daily   polyethylene glycol  17 g Oral Daily   rosuvastatin   10 mg Oral Daily   senna-docusate  1 tablet Oral BID   sevelamer  carbonate  1,600 mg Oral TID WC      sodium chloride  Stopped (10/31/23 2230)    Assessment/Plan:   ? SBE:  admitted with sepsis and left foot infection growing multiple organisms including enterococcus, and staph on path. BC;s negative 9/25 TTE with mass on anterior leaflet of MV that appears more chronic and calcified. TEE scheduled this am with Dr Floretta.  If vegetation suggested would need ID recs for antibiotic coverage for 4-6 weeks. He is not a candidate for surgery due to co morbidities and deformity not causing any significant MR Bradycardia:  seen by Dr Cindie earlier this admission for such. Wenkebach rhtym with no long pauses. No further monitoring or w/u suggested CRF:  continue dialysis via left brachiocephalic access Today is dialysis day can do after TEE this am scheduled for 10:00  Maude Emmer 11/01/2023, 8:57 AM

## 2023-11-01 NOTE — Progress Notes (Signed)
 [facility] Progress Note   Subjective:    Seen and examined patient at bedside. Patient's mother also at bedside. No acute complaints. S/p TEE today. Plan for HD this afternoon.  Objective Vitals:   11/01/23 1100 11/01/23 1105 11/01/23 1110 11/01/23 1115  BP: (!) 102/44 (!) 107/48 (!) 109/49 (!) 111/48  Pulse: 86 80 82 80  Resp: (!) 22 (!) 29 (!) 28 (!) 23  Temp:      TempSrc:      SpO2: 93% 93% 92% 93%  Weight:      Height:       Physical Exam General: alert, nad  Heart: RRR Resp: nml WOB on RA Abdomen: non distended Extremities: no LE edema, L foot dressed Dialysis Access: LU AVF +bruit   Filed Weights   10/30/23 0843 10/30/23 1243 11/01/23 0600  Weight: 95.9 kg 93.9 kg 94.3 kg    Intake/Output Summary (Last 24 hours) at 11/01/2023 1251 Last data filed at 11/01/2023 1033 Gross per 24 hour  Intake 700 ml  Output 0 ml  Net 700 ml    Additional Objective Labs: Basic Metabolic Panel: Recent Labs  Lab 10/29/23 0310 10/30/23 0535 11/01/23 0205  NA 131* 132* 134*  K 5.2* 5.6* 5.5*  CL 89* 91* 93*  CO2 25 24 24   GLUCOSE 118* 116* 224*  BUN 55* 71* 66*  CREATININE 11.05* 13.90* 12.73*  CALCIUM  9.4 9.4 9.2  PHOS  --   --  7.9*   Liver Function Tests: Recent Labs  Lab 10/28/23 1542 11/01/23 0205  AST 16  --   ALT 15  --   ALKPHOS 215*  --   BILITOT 0.6  --   PROT 6.9  --   ALBUMIN 2.6* 2.5*   No results for input(s): LIPASE, AMYLASE in the last 168 hours. CBC: Recent Labs  Lab 10/27/23 0324 10/28/23 0751 10/28/23 1542 10/30/23 0900 11/01/23 0205  WBC 10.6* 12.9* 11.5* 11.9* 10.1  NEUTROABS  --   --  9.2*  --  8.0*  HGB 10.2* 11.3* 10.6* 9.9* 9.7*  HCT 30.9* 34.2* 32.1* 30.6* 30.1*  MCV 93.9 94.0 94.7 95.6 95.9  PLT 289 325 279 271 281   Blood Culture    Component Value Date/Time   SDES TISSUE LEFT FOOT 10/23/2023 1640   SPECREQUEST NONE 10/23/2023 1640   CULT  10/23/2023 1640    MODERATE ENTEROCOCCUS FAECALIS FEW  STENOTROPHOMONAS MALTOPHILIA FEW STAPHYLOCOCCUS AUREUS NO ANAEROBES ISOLATED Sent to [facility] for further susceptibility testing. Performed at [facility], [address], [place], [place] [address]    REPTSTATUS PENDING 10/23/2023 1640    Cardiac Enzymes: No results for input(s): CKTOTAL, CKMB, CKMBINDEX, TROPONINI in the last 168 hours. CBG: Recent Labs  Lab 10/31/23 1105 10/31/23 1554 10/31/23 2018 11/01/23 0743 11/01/23 1149  GLUCAP 236* 185* 145* 162* 134*   Iron Studies: No results for input(s): IRON, TIBC, TRANSFERRIN, FERRITIN in the last 72 hours. Lab Results  Component Value Date   INR 1.1 10/19/2023   INR 1.0 11/14/2018   INR 1.0 11/01/2018   Studies/Results: ECHO TEE Result Date: 11/01/2023    TRANSESOPHOGEAL ECHO REPORT   Patient Name:   LOCHLAN GRYGIEL Date of Exam: 11/01/2023 Medical Rec #:  [id number]    Height:       68.0 in Accession #:    [id number]   Weight:       207.9 lb Date of Birth:  Oct 27, 1960    BSA:  2.078 m Patient Age:    63 years     BP:           112/39 mmHg Patient Gender: M            HR:           68 bpm. Exam Location:  Inpatient Procedure: Transesophageal Echo, 3D Echo, Cardiac Doppler and Color Doppler            (Both Spectral and Color Flow Doppler were utilized during            procedure). Indications:     Endocarditis  History:         Patient has prior history of Echocardiogram examinations, most                  recent 10/31/2023. Stroke; Risk Factors:Diabetes and                  Hypertension.  Sonographer:     Jayson Gaskins Referring Phys:  [id number] MAUDE JAYSON EMMER Diagnosing Phys: Georganna Archer PROCEDURE: After discussion of the risks and benefits of a TEE, an informed consent was obtained from the patient. The transesophogeal probe was passed without difficulty through the esophogus of the patient. Sedation performed by different physician. The patient was monitored while under deep sedation. Anesthestetic sedation was  provided intravenously by Anesthesiology: 250mg  of Propofol , 50mg  of Lidocaine . Image quality was excellent. The patient's vital signs; including heart rate, blood pressure, and oxygen saturation; remained stable throughout the procedure. The patient developed no complications during the procedure.  IMPRESSIONS  1. Left ventricular ejection fraction, by estimation, is 60 to 65%. The left ventricle has normal function. The left ventricle has no regional wall motion abnormalities.  2. Right ventricular systolic function is normal. The right ventricular size is normal.  3. No left atrial/left atrial appendage thrombus was detected.  4. There is a calcified mass on the anterior MV leaflet measuring 0.8 cm x 1.3 cm that moves in concert with the anterior MV leaflet. This mass is favored to be calcific thickening of the leaflet, but an infective component cannot be ruled out. Associated with this calcified mass is an independently mobile echodensity measuring 0.4 cm x 0.8 cm present on the LV side of the anterior MV leaflet. 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 The mitral valve is abnormal. Mild mitral valve regurgitation.  5. The aortic valve is calcified. Aortic valve regurgitation is not visualized. Mild aortic valve stenosis. Aortic valve mean gradient measures 18.0 mmHg. Aortic valve Vmax measures 2.10 m/s.  6. Evidence of atrial level shunting detected by color flow Doppler. There is a small patent foramen ovale.  7. 3D performed of the mitral valve. 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 I recommend consultation to ID for further assessment/management. FINDINGS  Left Ventricle: Left ventricular ejection fraction, by estimation, is 60 to 65%. The left ventricle has normal function. The left ventricle has no regional wall motion abnormalities. The left ventricular internal cavity  size was normal in size. Right Ventricle: The right ventricular size is normal. No increase in right ventricular wall thickness. Right ventricular systolic function is normal. Left Atrium: Left atrial size was normal in size. No left atrial/left atrial appendage thrombus was detected. Right Atrium: Right atrial size was normal in size. Pericardium: Trivial pericardial effusion is present. The pericardial effusion is circumferential. Mitral Valve: There is a calcified mass  on the anterior MV leaflet measuring 0.8 cm x 1.3 cm that moves in concert with the anterior MV leaflet. This mass is favored to be calcific thickening of the leaflet, but an infective component cannot be ruled out. Associated with this calcified mass is an independently mobile echodensity measuring 0.4 cm x 0.8 cm present on the LV side of the anterior MV leaflet. 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 The mitral valve is abnormal. There is severe thickening of the anterior mitral valve leaflet(s). There is severe calcification of the anterior mitral valve leaflet(s). Normal mobility of the mitral valve leaflets. Mild mitral valve regurgitation. Tricuspid Valve: The tricuspid valve is grossly normal. Tricuspid valve regurgitation is trivial. Aortic Valve: The aortic valve is calcified. Aortic valve regurgitation is not visualized. Mild aortic stenosis is present. Aortic valve mean gradient measures 18.0 mmHg. Aortic valve peak gradient measures 17.6 mmHg. Pulmonic Valve: The pulmonic valve was grossly normal. Pulmonic valve regurgitation is trivial. Aorta: The aortic root, ascending aorta and aortic arch are all structurally normal, with no evidence of dilitation or obstruction. IAS/Shunts: The interatrial septum appears to be lipomatous. Evidence of atrial level shunting detected by color flow Doppler. A small patent foramen ovale is detected. Additional Comments: 3D was performed not requiring image post processing on  an independent workstation and was abnormal. AORTIC VALVE AV Vmax:      210.00 cm/s AV Peak Grad: 17.6 mmHg AV Mean Grad: 18.0 mmHg Georganna Archer Electronically signed by Georganna Archer Signature Date/Time: 11/01/2023/11:33:50 AM    Final    EP STUDY Result Date: 11/01/2023 See surgical note for result.  ECHOCARDIOGRAM COMPLETE Result Date: 10/31/2023    ECHOCARDIOGRAM REPORT   Patient Name:   NICKEY KLOEPFER Date of Exam: 10/31/2023 Medical Rec #:  [id number]    Height:       68.0 in Accession #:    [id number]   Weight:       207.0 lb Date of Birth:  02/08/1960    BSA:          2.074 m Patient Age:    63 years     BP:           124/51 mmHg Patient Gender: M            HR:           96 bpm. Exam Location:  Inpatient Procedure: 2D Echo, Cardiac Doppler and Color Doppler (Both Spectral and Color            Flow Doppler were utilized during procedure). Indications:    Abnormal EKG R94.31  History:        Patient has prior history of Echocardiogram examinations, most                 recent 11/17/2023. CHF, Stroke; Risk Factors:Hypertension and                 Diabetes. H/O Pulmonary hypertension, ESRD, Bradycardia, Aortic                 stenosis.  Sonographer:    BERNARDA ROCKS Referring Phys: [id number] AMRIT ADHIKARI IMPRESSIONS  1. Left ventricular ejection fraction, by estimation, is 60 to 65%. The left ventricle has normal function. The left ventricle has no regional wall motion abnormalities. Left ventricular diastolic parameters were normal. There is the interventricular septum is flattened in diastole ('D' shaped left ventricle), consistent with right ventricular volume overload.  2. Right ventricular systolic function is  normal. The right ventricular size is normal.  3. There is a 1.1 x 1.5 calcified mobile echodensity attached to the base and medial aspect (A1) of the anterior MV leaflet. . The mitral valve is normal in structure. No evidence of mitral valve regurgitation. No evidence of mitral stenosis.  4.  The aortic valve is normal in structure. There is moderate calcification of the aortic valve. Aortic valve regurgitation is not visualized. Mild to moderate aortic valve stenosis. Aortic valve area, by VTI measures 1.55 cm. Aortic valve mean gradient measures 15.0 mmHg. Aortic valve Vmax measures 2.83 m/s.  5. The inferior vena cava is normal in size with greater than 50% respiratory variability, suggesting right atrial pressure of 3 mmHg. FINDINGS  Left Ventricle: Left ventricular ejection fraction, by estimation, is 60 to 65%. The left ventricle has normal function. The left ventricle has no regional wall motion abnormalities. The left ventricular internal cavity size was normal in size. There is  no left ventricular hypertrophy. The interventricular septum is flattened in diastole ('D' shaped left ventricle), consistent with right ventricular volume overload. Left ventricular diastolic parameters were normal. Right Ventricle: The right ventricular size is normal. No increase in right ventricular wall thickness. Right ventricular systolic function is normal. Left Atrium: Left atrial size was normal in size. Right Atrium: Right atrial size was normal in size. Pericardium: There is no evidence of pericardial effusion. Mitral Valve: There is a 1.1 x 1.5 calcified mobile echodensity attached to the base and medial aspect (A1) of the anterior MV leaflet. The mitral valve is normal in structure. No evidence of mitral valve regurgitation. No evidence of mitral valve stenosis. MV peak gradient, 11.3 mmHg. The mean mitral valve gradient is 4.0 mmHg. Tricuspid Valve: The tricuspid valve is normal in structure. Tricuspid valve regurgitation is trivial. No evidence of tricuspid stenosis. Aortic Valve: The aortic valve is normal in structure. There is moderate calcification of the aortic valve. Aortic valve regurgitation is not visualized. Mild to moderate aortic stenosis is present. Aortic valve mean gradient measures 15.0  mmHg. Aortic valve peak gradient measures 32.0 mmHg. Aortic valve area, by VTI measures 1.55 cm. Pulmonic Valve: The pulmonic valve was normal in structure. Pulmonic valve regurgitation is not visualized. No evidence of pulmonic stenosis. Aorta: The aortic root is normal in size and structure. Venous: The inferior vena cava is normal in size with greater than 50% respiratory variability, suggesting right atrial pressure of 3 mmHg. IAS/Shunts: No atrial level shunt detected by color flow Doppler.  LEFT VENTRICLE PLAX 2D LVIDd:         4.30 cm      Diastology LVIDs:         3.00 cm      LV e' medial:    10.10 cm/s LV PW:         0.90 cm      LV E/e' medial:  15.0 LV IVS:        0.90 cm      LV e' lateral:   14.00 cm/s LVOT diam:     2.20 cm      LV E/e' lateral: 10.8 LV SV:         75 LV SV Index:   36 LVOT Area:     3.80 cm  LV Volumes (MOD) LV vol d, MOD A2C: 164.0 ml LV vol d, MOD A4C: 189.0 ml LV vol s, MOD A2C: 49.3 ml LV vol s, MOD A4C: 56.7 ml LV SV MOD A2C:  114.7 ml LV SV MOD A4C:     189.0 ml LV SV MOD BP:      124.6 ml RIGHT VENTRICLE             IVC RV Basal diam:  3.20 cm     IVC diam: 1.90 cm RV S prime:     12.90 cm/s TAPSE (M-mode): 1.4 cm LEFT ATRIUM             Index        RIGHT ATRIUM           Index LA diam:        3.90 cm 1.88 cm/m   RA Area:     14.40 cm LA Vol (A2C):   42.0 ml 20.25 ml/m  RA Volume:   34.70 ml  16.73 ml/m LA Vol (A4C):   71.8 ml 34.62 ml/m LA Biplane Vol: 57.0 ml 27.48 ml/m  AORTIC VALVE                     PULMONIC VALVE AV Area (Vmax):    1.57 cm      PV Vmax:          1.23 m/s AV Area (Vmean):   1.55 cm      PV Peak grad:     6.1 mmHg AV Area (VTI):     1.55 cm      PR End Diast Vel: 1.07 msec AV Vmax:           283.00 cm/s AV Vmean:          180.000 cm/s AV VTI:            0.485 m AV Peak Grad:      32.0 mmHg AV Mean Grad:      15.0 mmHg LVOT Vmax:         117.00 cm/s LVOT Vmean:        73.300 cm/s LVOT VTI:          0.198 m LVOT/AV VTI ratio: 0.41  AORTA Ao  Root diam: 3.00 cm Ao Asc diam:  3.20 cm MITRAL VALVE MV Area (PHT): 5.84 cm     SHUNTS MV Area VTI:   2.47 cm     Systemic VTI:  0.20 m MV Peak grad:  11.3 mmHg    Systemic Diam: 2.20 cm MV Mean grad:  4.0 mmHg MV Vmax:       1.68 m/s MV Vmean:      88.2 cm/s MV Decel Time: 130 msec MV E velocity: 151.00 cm/s MV A velocity: 41.40 cm/s MV E/A ratio:  3.65 Franck Azobou Tonleu Electronically signed by Joelle Cedars Tonleu Signature Date/Time: 10/31/2023/9:23:36 AM    Final     Medications:   (feeding supplement) PROSource Plus  30 mL Oral BID BM   aspirin  EC  81 mg Oral Daily   Chlorhexidine  Gluconate Cloth  6 each Topical Q0600   cinacalcet   60 mg Oral Q T,Th,Sat-1800   darbepoetin (ARANESP ) injection - DIALYSIS  60 mcg Subcutaneous Q Fri-1800   gabapentin   100 mg Oral TID   heparin  injection (subcutaneous)  5,000 Units Subcutaneous Q8H   insulin  aspart  0-6 Units Subcutaneous TID WC   insulin  aspart  4 Units Subcutaneous TID WC   insulin  glargine  50 Units Subcutaneous Daily   multivitamin  1 tablet Oral QHS   mupirocin  cream   Topical QODAY   pantoprazole   40 mg Oral Daily  polyethylene glycol  17 g Oral Daily   rosuvastatin   10 mg Oral Daily   senna-docusate  1 tablet Oral BID   sevelamer  carbonate  1,600 mg Oral TID WC    Dialysis Orders: MWF - NW 4hr, 400/A1.5, EDW 96.8kg, 2K/2.5Ca bath, AVF, Heparin  2600 - no ESA, Hgb > goal - Calcitriol 1.75 three times per week  Assessment/Plan: Sepsis/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 S/p TEE today. ESRD.  Continue usual MWF HD - Next HD this afternoon. Hypertension/volume. BP controlled, min LE edema, but low Na - UF as tolerated. Anemia. Hgb 10.6. No indication for ESA at this time.  Metabolic bone disease.  CorrCa high sided, VDRA on hold. Phos above goal. Continue home binders/cinacalcet . Nutrition: Alb low, continue supplements.  Renal diet w/fluid restrictions.  T2DM: Insulin  per  primary. DVT prophylaxis: Prefer heparin  over Lovenox  in ESRD patient, AC on hold s/p surgery. Dispo: SNF recommended by PT Bradycardia - HR to 30s on 10/5. Cardiology consulted. Report [place] conduction.  No additional work up indicated. Recommend K>4 and Mg>2, avoid AV nodal blockers.   Charmaine Piety, NP [facility] 11/01/2023,12:51 PM  LOS: 13 days

## 2023-11-01 NOTE — Significant Event (Signed)
 1900: This nurse to bedside upon patient returning to the floor. Patient found with increased work of breathing (heavy abd breathing), ST on monitor at 102bpm, 36 respiration, 80% on 2L, 90/50 Manual BP to RUA. 1905: Staff to bedside, this RN, [place], RN (Charge), [place], Charity fundraiser (Charge), [place], Charity fundraiser, [place], [place], [place], [place]  1910: [employer], Code cart bedside, pads placed on patient  1912: 101.7 Rectal  1914: 70% on 2L, [place] up to 4L/min, BG: 120  1916: 102/40 Manual  1915: Adhikari urgent chatted at this time, instructed to contact on call provider.   1918: RR team to bedside Ce[place], RN and unknown team member) Unknown to this RN: Dr. Vira Groves Paged but did not receive, secure chat started, this RN provide direct line.  1930: Dr. Segars responds to page at this time. Updated, via phone, about patient status upon arrival to floor, vitals, interventions, and outcomes. Recommends nebulizer and states en route.  1940: Dr. Keturah arrives to bedside, updated on situation, assesses patient and recommends sepsis work up, Initiation of Antibiotics and transfer to higher acuity floor.  1945: Dr. Keturah away from bedside 1950: This nurse steps away from bedside to call report, RR RN bedside overseeing patient  2003: Report called to 3E receiving RN at this time, all questions answered at this time.  2012BETHA Leader, RN Museum/gallery curator) notifies this nurse and Dr. Keturah that patient's BP is 80/40 (Manual), Dr. Seagrs verbalizes 500cc bolus. 2014: 650mg  rectal tylenol  given at this time   2015: Bolus and IV antibiotics initiated by Joyceann, RN (RR), this nurse performs manual BP on patient at this time, 82/palp.  2020: XR bedside  2025: Patient belongings placed in belongings bag (Black bag with clothes, glasses, cell phone, charger, stuffed animal) Bag placed on patient bed (between legs) 2030: Patient ready to transfer, portable O2 running at 4L [place], Antibiotics x 2 running with NS bolus, patient remains on 62M telemetry box   2033: Joyceann, RN (RR) and Leader, RN escorts patient to 3E at this time.   Event Assessment:  Patient started and remained alert to self only, responds to name and pain intermittently.  Pupils 2 with no reaction for this nurse.  Skin is hot, red, and dry Patient is able to move limbs with jerking motion (esp. L arm) Chest is rising and falling sym. Tachycardic on telemetry All lung fields are diminished Abd breathing with rib base retractions Rest of assessment is baseline for patient.     2034: Patient's mother called at this time, call not answered. Voicemail box full. This nurse recommends that the team attempt to contact mother in morning. Ms. [name] [phone]  2039: Ms. [name] contacts this RN at this time, updated on patient's status, plan of care and unit transfer. She states she will visit patient in morning.

## 2023-11-01 NOTE — Progress Notes (Signed)
   11/01/23 1814  Vitals  Temp 99.8 F (37.7 C)  Pulse Rate 99  Resp (!) 29  BP (!) 108/59  SpO2 92 %  O2 Device Room Air  Post Treatment  Dialyzer Clearance Lightly streaked  Hemodialysis Intake (mL) 0 mL  Liters Processed 76.6  Fluid Removed (mL) 1500 mL  Tolerated HD Treatment Yes  AVG/AVF Arterial Site Held (minutes) 10 minutes  AVG/AVF Venous Site Held (minutes) 10 minutes   Received patient in bed to unit.  Alert and oriented.  Informed consent signed and in chart.   TX duration:3.25hrs  Patient tolerated well.  Transported back to the room  Alert, without acute distress.  Hand-off given to patient's nurse.   Access used: lavf Access issues: none  Total UF removed: 1.5L Medication(s) given: none    Na'Shaminy T [facility]

## 2023-11-01 NOTE — Progress Notes (Signed)
 PROGRESS NOTE  Banner Huckaba  FMW:[id number] DOB: Sep 14, 1960 DOA: 10/19/2023 PCP: Katrinka Garnette KIDD, MD   Brief Narrative: Patient is a 63 male with history of ESRD on dialysis on Monday, Wednesday, Friday, HFpEF, diabetes type 2, prior CVA who was recently admitted for amputation of L 2nd toe through proximal phalanx  presented with complaint of purulent discharge in the left second toe, chills.  On presentation he was febrile, tachycardic, tachypneic, requiring 2 L of oxygen.  Labs with potassium 5.2, creatinine 5.7, lactic acidosis 5.6.XR L foot w/o acute findings or OM .  Patient was admitted for further management of sepsis secondary to left foot wound infection.  Started on broad spectrum antibiotics.  Podiatry, vascular surgery consulted.  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  PT/OT recommending SNF on discharge.  TTE showed 1.1 cm calcified mobile echodensity on the anterior mitral valve leaflet.  TEE confirmed endocarditis.  ID consulted  Assessment & Plan:  Principal Problem:   Sepsis (HCC) Active Problems:   Pyogenic inflammation of bone (HCC)   Diabetes mellitus type 2 with complications (HCC)   ESRD on dialysis (HCC)   Neuropathy   PAD (peripheral artery disease)   Gangrene of left foot (HCC)   Bradycardia   PAF (paroxysmal atrial fibrillation) (HCC)   Abnormal echocardiogram   Bacterial endocarditis  Sepsis secondary to surgical wound infection of the left foot: Recent history of amputation of L 2nd toe through proximal phalanx.  Presented with fever, chills, tachycardia, tachypnea, lactic acidosis.Started on broad spectrum antibiotics.  Podiatry, vascular surgery consulted.  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 Podiatry recommended nonweightbearing on postop shoe left lower extremity for 2 weeks. Culture showed mixed organisms. Completed the course of  Augmentin .  Blood cultures had been negative.  Bradycardia/secondary heart  block: Became bradycardia in the afternoon of 10/4.  Did not require atropine or temporary pacemaker.  Heart rate spontaneously stabilized.  Blood pressure remained stable.  Cardiology consulted.  TSH normal.  EKG shows second-degree heart block, Mobitz type I/Wenckebach.  Heart rate remains stable now .   Avoid AV nodal blocking agents.Echo showed EF of 60 to 65%, normal right ventricular function but also showed  1.1 x 1.5 calcified mobile echodensity attached to the base and medial aspect (A1) of the anterior MV leaflet. TEE confirmed endocarditis.  ID consulted.  His previous blood cultures have been negative.  Type 2 diabetes: Continue current insulin  regimen.  Diabetic coordinator following  ESRD on dialysis/metabolic bone disease/hyperkalemia: Nephrology following for dialysis.  Continue Renvela , Sensipar .  Dialyzed on schedule.  Neuropathy: Gabapentin   Peripheral artery disease: Continue Crestor .  Follow-up with vascular surgery as an outpatient.  Obesity: BMI of 32  Constipation: Continue bowel regimen  Deconditioning/debility: PT recommended SNF on discharge.  Waiting for bed availability.  TOC following       DVT prophylaxis:heparin  injection 5,000 Units Start: 10/26/23 0915     Code Status: Full Code  Family Communication: Mother at bedside on 10/8  Patient status:Inpatient  Patient is from :Home  Anticipated discharge to:SNF  Estimated DC date:whenever possible   Consultants: Podiatry,vascular surgery  Procedures:transmetatarsal amputation of left foot ,TEE  Antimicrobials:  Anti-infectives (From admission, onward)    Start     Dose/Rate Route Frequency Ordered Stop   10/27/23 2200  amoxicillin -clavulanate (AUGMENTIN ) 500-125 MG per tablet 1 tablet        1 tablet Oral Daily at bedtime 10/27/23 0849 10/31/23 2203   10/27/23 1000  amoxicillin -clavulanate (AUGMENTIN ) 500-125 MG per tablet 1 tablet  Status:  Discontinued        1 tablet Oral Every 12 hours  10/26/23 2155 10/27/23 0849   10/26/23 2245  amoxicillin -clavulanate (AUGMENTIN ) 500-125 MG per tablet 1 tablet  Status:  Discontinued        1 tablet Oral Every 12 hours 10/26/23 2154 10/26/23 2155   10/23/23 1800  vancomycin  (VANCOCIN ) IVPB 1000 mg/200 mL premix  Status:  Discontinued        1,000 mg 200 mL/hr over 60 Minutes Intravenous Every M-W-F (Hemodialysis) 10/23/23 1457 10/27/23 0846   10/20/23 1800  ceFEPIme  (MAXIPIME ) 1 g in sodium chloride  0.9 % 100 mL IVPB  Status:  Discontinued        1 g 200 mL/hr over 30 Minutes Intravenous Every 24 hours 10/19/23 1311 10/26/23 2154   10/20/23 1200  vancomycin  (VANCOCIN ) IVPB 1000 mg/200 mL premix  Status:  Discontinued        1,000 mg 200 mL/hr over 60 Minutes Intravenous Every M-W-F (Hemodialysis) 10/20/23 0725 10/23/23 1457   10/19/23 2200  metroNIDAZOLE  (FLAGYL ) tablet 500 mg  Status:  Discontinued        500 mg Oral Every 12 hours 10/19/23 1235 10/26/23 2154   10/19/23 1310  vancomycin  variable dose per unstable renal function (pharmacist dosing)  Status:  Discontinued         Does not apply See admin instructions 10/19/23 1311 10/20/23 0725   10/19/23 0845  ceFEPIme  (MAXIPIME ) 2 g in sodium chloride  0.9 % 100 mL IVPB        2 g 200 mL/hr over 30 Minutes Intravenous  Once 10/19/23 0839 10/19/23 1347   10/19/23 0845  metroNIDAZOLE  (FLAGYL ) IVPB 500 mg        500 mg 100 mL/hr over 60 Minutes Intravenous  Once 10/19/23 0839 10/19/23 1519   10/19/23 0845  vancomycin  (VANCOCIN ) IVPB 1000 mg/200 mL premix  Status:  Discontinued        1,000 mg 200 mL/hr over 60 Minutes Intravenous  Once 10/19/23 0839 10/19/23 0839   10/19/23 0845  vancomycin  (VANCOREADY) IVPB 2000 mg/400 mL        2,000 mg 200 mL/hr over 120 Minutes Intravenous STAT 10/19/23 0839 10/19/23 1207       Subjective: Patient seen and examined at bedside this morning.  He was about to go for TEE.  Denies any new complaints today.  Hemodynamically stable.  Heart rate is  stable.  Objective: Vitals:   11/01/23 1100 11/01/23 1105 11/01/23 1110 11/01/23 1115  BP: (!) 102/44 (!) 107/48 (!) 109/49 (!) 111/48  Pulse: 86 80 82 80  Resp: (!) 22 (!) 29 (!) 28 (!) 23  Temp:      TempSrc:      SpO2: 93% 93% 92% 93%  Weight:      Height:        Intake/Output Summary (Last 24 hours) at 11/01/2023 1148 Last data filed at 11/01/2023 1033 Gross per 24 hour  Intake 700 ml  Output 0 ml  Net 700 ml   Filed Weights   10/30/23 0843 10/30/23 1243 11/01/23 0600  Weight: 95.9 kg 93.9 kg 94.3 kg    Examination:  General exam: Overall comfortable, not in distress,lying on bed HEENT: PERRL Respiratory system:  no wheezes or crackles  Cardiovascular system: S1 & S2 heard, RRR.  Gastrointestinal system: Abdomen is nondistended, soft and nontender. Central nervous system: Alert and oriented Extremities: No edema, no clubbing ,no cyanosis,  amputation of the left foot covered with dressing, left upper extremity AV fistula Skin: No rashes, no ulcers,no icterus      Data Reviewed: I have personally reviewed following labs and imaging studies  CBC: Recent Labs  Lab 10/27/23 0324 10/28/23 0751 10/28/23 1542 10/30/23 0900 11/01/23 0205  WBC 10.6* 12.9* 11.5* 11.9* 10.1  NEUTROABS  --   --  9.2*  --  8.0*  HGB 10.2* 11.3* 10.6* 9.9* 9.7*  HCT 30.9* 34.2* 32.1* 30.6* 30.1*  MCV 93.9 94.0 94.7 95.6 95.9  PLT 289 325 279 271 281   Basic Metabolic Panel: Recent Labs  Lab 10/28/23 0751 10/28/23 1542 10/29/23 0310 10/30/23 0535 11/01/23 0205  NA 131* 132* 131* 132* 134*  K 5.0 4.8 5.2* 5.6* 5.5*  CL 90* 89* 89* 91* 93*  CO2 26 26 25 24 24   GLUCOSE 144* 137* 118* 116* 224*  BUN 43* 50* 55* 71* 66*  CREATININE 8.70* 2.57* 11.05* 13.90* 12.73*  CALCIUM  10.1 9.9 9.4 9.4 9.2  MG  --   --   --  2.4  --   PHOS  --   --   --   --  7.9*     Recent Results (from the past 240 hours)  Aerobic/Anaerobic Culture w Gram Stain (surgical/deep wound)     Status: None  (Preliminary result)   Collection Time: 10/23/23  4:40 PM   Specimen: Wound; Tissue  Result Value Ref Range Status   Specimen Description TISSUE LEFT FOOT  Final   Special Requests NONE  Final   Gram Stain NO WBC SEEN RARE GRAM NEGATIVE RODS   Final   Culture   Final    MODERATE ENTEROCOCCUS FAECALIS FEW STENOTROPHOMONAS MALTOPHILIA FEW STAPHYLOCOCCUS AUREUS NO ANAEROBES ISOLATED Sent to [facility] for further susceptibility testing. Performed at [facility], [address], [place], [place] [address]    Report Status PENDING  Incomplete   Organism ID, Bacteria ENTEROCOCCUS FAECALIS  Final   Organism ID, Bacteria STAPHYLOCOCCUS AUREUS  Final      Susceptibility   Enterococcus faecalis - MIC*    AMPICILLIN  <=2 SENSITIVE Sensitive     VANCOMYCIN  2 SENSITIVE Sensitive     GENTAMICIN SYNERGY SENSITIVE Sensitive     * MODERATE ENTEROCOCCUS FAECALIS   Staphylococcus aureus - MIC*    CIPROFLOXACIN <=0.5 SENSITIVE Sensitive     ERYTHROMYCIN <=0.25 SENSITIVE Sensitive     GENTAMICIN <=0.5 SENSITIVE Sensitive     OXACILLIN 0.5 SENSITIVE Sensitive     TETRACYCLINE <=1 SENSITIVE Sensitive     VANCOMYCIN  <=0.5 SENSITIVE Sensitive     TRIMETH/SULFA <=10 SENSITIVE Sensitive     CLINDAMYCIN  <=0.25 SENSITIVE Sensitive     RIFAMPIN <=0.5 SENSITIVE Sensitive     Inducible Clindamycin  NEGATIVE Sensitive     LINEZOLID  2 SENSITIVE Sensitive     * FEW STAPHYLOCOCCUS AUREUS  Susceptibility, Aer + Anaerob     Status: Abnormal   Collection Time: 10/23/23  4:40 PM  Result Value Ref Range Status   Suscept, Aer + Anaerob Final report (A)  Corrected    Comment: (NOTE) Performed At: [facility] [employer] [address] [place], [place] [id number] Jennette Shorter MD Ey:[phone] CORRECTED ON 10/06 AT 0835: PREVIOUSLY REPORTED AS Preliminary report    Source STENOTROPHOMONAS MALTOPHILIA/ TISSUE LEFT FOOT  Final    Comment: Performed at [facility], [address] [place], [place]  [address]  Susceptibility Result     Status: Abnormal   Collection Time:  10/23/23  4:40 PM  Result Value Ref Range Status   Suscept Result 1 Comment (A)  Final    Comment: (NOTE) Stenotrophomonas maltophilia Identification performed by account, not confirmed by this laboratory. Levofloxacin and Trimethoprim-Sulfamethoxazole should not be used alone for antimicrobial therapy (CLSI).    Antimicrobial Suscept Comment  Corrected    Comment: (NOTE)      ** S = Susceptible; I = Intermediate; R = Resistant **                   P = Positive; N = Negative            MICS are expressed in micrograms per mL   Antibiotic                 RSLT#1    RSLT#2    RSLT#3    RSLT#4 Levofloxacin                   S Minocycline                    S Trimethoprim/Sulfa             S Performed At: [facility] [employer] [address] [place], [place] [id number] Jennette Shorter MD Ey:[phone]      Radiology Studies: ECHO TEE Result Date: 11/01/2023    TRANSESOPHOGEAL ECHO REPORT   Patient Name:   ABDULLOH ULLOM Date of Exam: 11/01/2023 Medical Rec #:  [id number]    Height:       68.0 in Accession #:    [id number]   Weight:       207.9 lb Date of Birth:  1960/07/14    BSA:          2.078 m Patient Age:    63 years     BP:           112/39 mmHg Patient Gender: M            HR:           68 bpm. Exam Location:  Inpatient Procedure: Transesophageal Echo, 3D Echo, Cardiac Doppler and Color Doppler            (Both Spectral and Color Flow Doppler were utilized during            procedure). Indications:     Endocarditis  History:         Patient has prior history of Echocardiogram examinations, most                  recent 10/31/2023. Stroke; Risk Factors:Diabetes and                  Hypertension.  Sonographer:     Jayson Gaskins Referring Phys:  [id number] MAUDE JAYSON EMMER Diagnosing Phys: Georganna Archer PROCEDURE: After discussion of the risks and benefits of a TEE, an informed consent was obtained from the patient. The transesophogeal  probe was passed without difficulty through the esophogus of the patient. Sedation performed by different physician. The patient was monitored while under deep sedation. Anesthestetic sedation was provided intravenously by Anesthesiology: 250mg  of Propofol , 50mg  of Lidocaine . Image quality was excellent. The patient's vital signs; including heart rate, blood pressure, and oxygen saturation; remained stable throughout the procedure. The patient developed no complications during the procedure.  IMPRESSIONS  1. Left ventricular ejection fraction, by estimation, is 60 to 65%. The left ventricle has normal function. The left ventricle has no regional wall  motion abnormalities.  2. Right ventricular systolic function is normal. The right ventricular size is normal.  3. No left atrial/left atrial appendage thrombus was detected.  4. There is a calcified mass on the anterior MV leaflet measuring 0.8 cm x 1.3 cm that moves in concert with the anterior MV leaflet. This mass is favored to be calcific thickening of the leaflet, but an infective component cannot be ruled out. Associated with this calcified mass is an independently mobile echodensity measuring 0.4 cm x 0.8 cm present on the LV side of the anterior MV leaflet. 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 The mitral valve is abnormal. Mild mitral valve regurgitation.  5. The aortic valve is calcified. Aortic valve regurgitation is not visualized. Mild aortic valve stenosis. Aortic valve mean gradient measures 18.0 mmHg. Aortic valve Vmax measures 2.10 m/s.  6. Evidence of atrial level shunting detected by color flow Doppler. There is a small patent foramen ovale.  7. 3D performed of the mitral valve. 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 I recommend consultation to ID for further assessment/management. FINDINGS  Left Ventricle: Left  ventricular ejection fraction, by estimation, is 60 to 65%. The left ventricle has normal function. The left ventricle has no regional wall motion abnormalities. The left ventricular internal cavity size was normal in size. Right Ventricle: The right ventricular size is normal. No increase in right ventricular wall thickness. Right ventricular systolic function is normal. Left Atrium: Left atrial size was normal in size. No left atrial/left atrial appendage thrombus was detected. Right Atrium: Right atrial size was normal in size. Pericardium: Trivial pericardial effusion is present. The pericardial effusion is circumferential. Mitral Valve: There is a calcified mass on the anterior MV leaflet measuring 0.8 cm x 1.3 cm that moves in concert with the anterior MV leaflet. This mass is favored to be calcific thickening of the leaflet, but an infective component cannot be ruled out. Associated with this calcified mass is an independently mobile echodensity measuring 0.4 cm x 0.8 cm present on the LV side of the anterior MV leaflet. 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 The mitral valve is abnormal. There is severe thickening of the anterior mitral valve leaflet(s). There is severe calcification of the anterior mitral valve leaflet(s). Normal mobility of the mitral valve leaflets. Mild mitral valve regurgitation. Tricuspid Valve: The tricuspid valve is grossly normal. Tricuspid valve regurgitation is trivial. Aortic Valve: The aortic valve is calcified. Aortic valve regurgitation is not visualized. Mild aortic stenosis is present. Aortic valve mean gradient measures 18.0 mmHg. Aortic valve peak gradient measures 17.6 mmHg. Pulmonic Valve: The pulmonic valve was grossly normal. Pulmonic valve regurgitation is trivial. Aorta: The aortic root, ascending aorta and aortic arch are all structurally normal, with no evidence of dilitation or obstruction. IAS/Shunts: The interatrial septum appears to  be lipomatous. Evidence of atrial level shunting detected by color flow Doppler. A small patent foramen ovale is detected. Additional Comments: 3D was performed not requiring image post processing on an independent workstation and was abnormal. AORTIC VALVE AV Vmax:      210.00 cm/s AV Peak Grad: 17.6 mmHg AV Mean Grad: 18.0 mmHg Georganna Archer Electronically signed by Georganna Archer Signature Date/Time: 11/01/2023/11:33:50 AM    Final    EP STUDY Result Date: 11/01/2023 See surgical note for result.  ECHOCARDIOGRAM COMPLETE Result Date: 10/31/2023    ECHOCARDIOGRAM REPORT   Patient Name:   JUVENTINO  Koob Date of Exam: 10/31/2023 Medical Rec #:  [id number]    Height:       68.0 in Accession #:    [id number]   Weight:       207.0 lb Date of Birth:  1960/04/09    BSA:          2.074 m Patient Age:    63 years     BP:           124/51 mmHg Patient Gender: M            HR:           96 bpm. Exam Location:  Inpatient Procedure: 2D Echo, Cardiac Doppler and Color Doppler (Both Spectral and Color            Flow Doppler were utilized during procedure). Indications:    Abnormal EKG R94.31  History:        Patient has prior history of Echocardiogram examinations, most                 recent 11/17/2023. CHF, Stroke; Risk Factors:Hypertension and                 Diabetes. H/O Pulmonary hypertension, ESRD, Bradycardia, Aortic                 stenosis.  Sonographer:    BERNARDA ROCKS Referring Phys: [id number] Sheketa Ende IMPRESSIONS  1. Left ventricular ejection fraction, by estimation, is 60 to 65%. The left ventricle has normal function. The left ventricle has no regional wall motion abnormalities. Left ventricular diastolic parameters were normal. There is the interventricular septum is flattened in diastole ('D' shaped left ventricle), consistent with right ventricular volume overload.  2. Right ventricular systolic function is normal. The right ventricular size is normal.  3. There is a 1.1 x 1.5 calcified mobile  echodensity attached to the base and medial aspect (A1) of the anterior MV leaflet. . The mitral valve is normal in structure. No evidence of mitral valve regurgitation. No evidence of mitral stenosis.  4. The aortic valve is normal in structure. There is moderate calcification of the aortic valve. Aortic valve regurgitation is not visualized. Mild to moderate aortic valve stenosis. Aortic valve area, by VTI measures 1.55 cm. Aortic valve mean gradient measures 15.0 mmHg. Aortic valve Vmax measures 2.83 m/s.  5. The inferior vena cava is normal in size with greater than 50% respiratory variability, suggesting right atrial pressure of 3 mmHg. FINDINGS  Left Ventricle: Left ventricular ejection fraction, by estimation, is 60 to 65%. The left ventricle has normal function. The left ventricle has no regional wall motion abnormalities. The left ventricular internal cavity size was normal in size. There is  no left ventricular hypertrophy. The interventricular septum is flattened in diastole ('D' shaped left ventricle), consistent with right ventricular volume overload. Left ventricular diastolic parameters were normal. Right Ventricle: The right ventricular size is normal. No increase in right ventricular wall thickness. Right ventricular systolic function is normal. Left Atrium: Left atrial size was normal in size. Right Atrium: Right atrial size was normal in size. Pericardium: There is no evidence of pericardial effusion. Mitral Valve: There is a 1.1 x 1.5 calcified mobile echodensity attached to the base and medial aspect (A1) of the anterior MV leaflet. The mitral valve is normal in structure. No evidence of mitral valve regurgitation. No evidence of mitral valve stenosis. MV peak gradient, 11.3 mmHg. The mean mitral valve gradient is 4.0 mmHg. Tricuspid Valve:  The tricuspid valve is normal in structure. Tricuspid valve regurgitation is trivial. No evidence of tricuspid stenosis. Aortic Valve: The aortic valve is  normal in structure. There is moderate calcification of the aortic valve. Aortic valve regurgitation is not visualized. Mild to moderate aortic stenosis is present. Aortic valve mean gradient measures 15.0 mmHg. Aortic valve peak gradient measures 32.0 mmHg. Aortic valve area, by VTI measures 1.55 cm. Pulmonic Valve: The pulmonic valve was normal in structure. Pulmonic valve regurgitation is not visualized. No evidence of pulmonic stenosis. Aorta: The aortic root is normal in size and structure. Venous: The inferior vena cava is normal in size with greater than 50% respiratory variability, suggesting right atrial pressure of 3 mmHg. IAS/Shunts: No atrial level shunt detected by color flow Doppler.  LEFT VENTRICLE PLAX 2D LVIDd:         4.30 cm      Diastology LVIDs:         3.00 cm      LV e' medial:    10.10 cm/s LV PW:         0.90 cm      LV E/e' medial:  15.0 LV IVS:        0.90 cm      LV e' lateral:   14.00 cm/s LVOT diam:     2.20 cm      LV E/e' lateral: 10.8 LV SV:         75 LV SV Index:   36 LVOT Area:     3.80 cm  LV Volumes (MOD) LV vol d, MOD A2C: 164.0 ml LV vol d, MOD A4C: 189.0 ml LV vol s, MOD A2C: 49.3 ml LV vol s, MOD A4C: 56.7 ml LV SV MOD A2C:     114.7 ml LV SV MOD A4C:     189.0 ml LV SV MOD BP:      124.6 ml RIGHT VENTRICLE             IVC RV Basal diam:  3.20 cm     IVC diam: 1.90 cm RV S prime:     12.90 cm/s TAPSE (M-mode): 1.4 cm LEFT ATRIUM             Index        RIGHT ATRIUM           Index LA diam:        3.90 cm 1.88 cm/m   RA Area:     14.40 cm LA Vol (A2C):   42.0 ml 20.25 ml/m  RA Volume:   34.70 ml  16.73 ml/m LA Vol (A4C):   71.8 ml 34.62 ml/m LA Biplane Vol: 57.0 ml 27.48 ml/m  AORTIC VALVE                     PULMONIC VALVE AV Area (Vmax):    1.57 cm      PV Vmax:          1.23 m/s AV Area (Vmean):   1.55 cm      PV Peak grad:     6.1 mmHg AV Area (VTI):     1.55 cm      PR End Diast Vel: 1.07 msec AV Vmax:           283.00 cm/s AV Vmean:          180.000 cm/s AV  VTI:            0.485 m AV Peak  Grad:      32.0 mmHg AV Mean Grad:      15.0 mmHg LVOT Vmax:         117.00 cm/s LVOT Vmean:        73.300 cm/s LVOT VTI:          0.198 m LVOT/AV VTI ratio: 0.41  AORTA Ao Root diam: 3.00 cm Ao Asc diam:  3.20 cm MITRAL VALVE MV Area (PHT): 5.84 cm     SHUNTS MV Area VTI:   2.47 cm     Systemic VTI:  0.20 m MV Peak grad:  11.3 mmHg    Systemic Diam: 2.20 cm MV Mean grad:  4.0 mmHg MV Vmax:       1.68 m/s MV Vmean:      88.2 cm/s MV Decel Time: 130 msec MV E velocity: 151.00 cm/s MV A velocity: 41.40 cm/s MV E/A ratio:  3.65 Franck Azobou Tonleu Electronically signed by Joelle Cedars Tonleu Signature Date/Time: 10/31/2023/9:23:36 AM    Final     Scheduled Meds:  (feeding supplement) PROSource Plus  30 mL Oral BID BM   aspirin  EC  81 mg Oral Daily   Chlorhexidine  Gluconate Cloth  6 each Topical Q0600   cinacalcet   60 mg Oral Q T,Th,Sat-1800   darbepoetin (ARANESP ) injection - DIALYSIS  60 mcg Subcutaneous Q Fri-1800   gabapentin   100 mg Oral TID   heparin  injection (subcutaneous)  5,000 Units Subcutaneous Q8H   insulin  aspart  0-6 Units Subcutaneous TID WC   insulin  aspart  4 Units Subcutaneous TID WC   insulin  glargine  50 Units Subcutaneous Daily   multivitamin  1 tablet Oral QHS   mupirocin  cream   Topical QODAY   pantoprazole   40 mg Oral Daily   polyethylene glycol  17 g Oral Daily   rosuvastatin   10 mg Oral Daily   senna-docusate  1 tablet Oral BID   sevelamer  carbonate  1,600 mg Oral TID WC   Continuous Infusions:      LOS: 13 days   Ivonne Mustache, MD [facility] P10/08/2023, 11:48 AM

## 2023-11-01 NOTE — Significant Event (Signed)
 Notified by RN of RRT for acute hypoxia, respiratory distress. Returned from dialysis and was placed on 2L during session. On arrival to the floor sat in 80's, recovered with 4L, but RR in 30s, BP borderline on manual 90/42, but wnl on automated cuff. Tachycardic at 102, febrile to 101 rectal. On exam tachycardic, soft heart tones, rales in the L base, diminished in the R, tachypneic, on [place], he has impaired attention, but is awake and can maintain brief alertness, a+Oxself, without focal deficits, following commands, moving all extremities antigravity.   A/P  Sepsis secondary to MV endocarditis Respiratory distress, suspect respiratory compensation from metabolic process v aspiration I/s/o delirium Delirium, likely secondary to above  -- Has not yet received Abx, RN hanging these now  -- CXR, CMP, CBC, lactate ordered  -- Nebs, tylenol  prn  -- Neurochecks q4 hr, currently without focal deficits.  -- Transfer to progressive LOC   Dorn Dawson, MD  [facility]

## 2023-11-01 NOTE — Sepsis Progress Note (Signed)
 Elink monitoring for the code sepsis protocol.

## 2023-11-01 NOTE — Consult Note (Signed)
 [facility]    Date of Admission:  10/19/2023          Reason for Consult: Antimicrobial management for the patient with infective endocarditis as per TEE.  Patient also with surgical wound infection of the left foot.  Has polymicrobial wound infection.     Referring Provider: Dr. Jillian. This patient 63 year old male with history of end-stage renal disease on hemodialysis, diabetes mellitus, hypertension, GERD, left foot gangrene, status post toe resection, CVA, pulmonary hypertension presented with progressive pain in the left foot along with some cough, hypoxia and fatigue. Patient also had reported of subjective fevers and chills and he was concerned that his left foot pain was progressive.  Patient had some nausea and vomiting.  He also complained of some fatigue and cough. As per the ER physician, patient was found to have coarse breath sounds and is was hypoxic.  Oxygen saturations were in the 80s.  He was placed on 2 L of oxygen by the EMS. Patient was found to be septic as he was also tachycardic and tachypneic.  Patient was febrile. Patient had undergone left fourth toe amputation couple of weeks back due to gangrene.  Patient apparently had gone home after surgery and was self managing the wound care. When he visited the clinic for follow-up he was found to have purulent drainage from the wound.  He was given oral pills for a week which he had completed couple of weeks prior to arrival. Patient had temperature of 100.8 on arrival to the ER.  He was placed on IV vancomycin , cefepime  and Flagyl .  Admitting diagnosis was sepsis secondary to surgical wound infection. Status post left foot transmetatarsal amputation on 10/23/2023.  During his hospital stay, TTE was done which did reveal possibility of a mass on anterior leaflet of mitral valve.  So TEE was done which revealed infective endocarditis.  So infectious disease consult has been called. The  wound cultures did grow polymicrobial organisms.   Assessment:  Sepsis, resolved. Surgical site infection of the left gangrenous foot.,  Resolved. Status post left TMA on 10/23/2023, postop day #9. Wound cultures grew moderate Enterococcus faecalis ,few Stenotrophomonas maltophilia ,few MSSA. Blood cultures x 2 have been negative so far. Stenotrophomonas maltophilia is sensitive to levofloxacin as well as Bactrim. Pathology revealed that the margins were clear of active infection. 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 End-stage renal disease on hemodialysis. Patient was placed on oral Augmentin  for a week on the outside and in-house he was on cefepime , Vanco and Flagyl .  The antibiotics were then stopped on October 26, 2023. He is undergoing hemodialysis right now.  He complains of severe chills.  Plan: Patient will need long-term IV antibiotics. His foot infection probably is the source of endocarditis. Will place the patient on IV Levaquin and IV vancomycin  in house. Patient probably can be discharged on IV vancomycin  and oral Levaquin at the time of discharge. Patient will need at least a minimum of 6 weeks of IV antibiotics. Follow-up with ID in 3 weeks as an outpatient. Weekly CBC, CMP, ESR, CRP while the patient is on antibiotics. Will send blood cultures x 2.  Will also send an ESR and CRP. Discussed with multidisciplinary team in detail.  Principal Problem:   Sepsis (HCC) Active Problems:   Diabetes mellitus type 2 with complications (HCC)   ESRD on dialysis (HCC)   Gangrene of left foot (HCC)   Pyogenic inflammation of  bone (HCC)   Neuropathy   PAD (peripheral artery disease)   Bradycardia   PAF (paroxysmal atrial fibrillation) (HCC)   Abnormal echocardiogram   Bacterial endocarditis    (feeding supplement) PROSource Plus  30 mL Oral BID BM   aspirin  EC  81 mg Oral Daily   Chlorhexidine  Gluconate Cloth  6 each Topical Q0600   cinacalcet   60 mg Oral Q  T,Th,Sat-1800   darbepoetin (ARANESP ) injection - DIALYSIS  60 mcg Subcutaneous Q Fri-1800   gabapentin   100 mg Oral TID   heparin  injection (subcutaneous)  5,000 Units Subcutaneous Q8H   insulin  aspart  0-6 Units Subcutaneous TID WC   insulin  aspart  4 Units Subcutaneous TID WC   insulin  glargine  50 Units Subcutaneous Daily   multivitamin  1 tablet Oral QHS   mupirocin  cream   Topical QODAY   pantoprazole   40 mg Oral Daily   polyethylene glycol  17 g Oral Daily   rosuvastatin   10 mg Oral Daily   senna-docusate  1 tablet Oral BID   sevelamer  carbonate  1,600 mg Oral TID WC    HPI: This patient 63 year old male with history of end-stage renal disease on hemodialysis, diabetes mellitus, hypertension, GERD, left foot gangrene, status post toe resection, CVA, pulmonary hypertension presented with progressive pain in the left foot along with some cough, hypoxia and fatigue. Patient also had reported of subjective fevers and chills and he was concerned that his left foot pain was progressive.  Patient had some nausea and vomiting.  He also complained of some fatigue and cough. As per the ER physician, patient was found to have coarse breath sounds and is was hypoxic.  Oxygen saturations were in the 80s.  He was placed on 2 L of oxygen by the EMS. Patient was found to be septic as he was also tachycardic and tachypneic.  Patient was febrile. Patient had undergone left fourth toe amputation couple of weeks back due to gangrene.  Patient apparently had gone home after surgery and was self managing the wound care. When he visited the clinic for follow-up he was found to have purulent drainage from the wound.  He was given oral pills for a week which he had completed couple of weeks prior to arrival. Patient had temperature of 100.8 on arrival to the ER.  He was placed on IV vancomycin , cefepime  and Flagyl .  Admitting diagnosis was sepsis secondary to surgical wound infection. Status post left foot  transmetatarsal amputation on 10/23/2023.  During his hospital stay, TTE was done which did reveal possibility of a mass on anterior leaflet of mitral valve.  So TEE was done which revealed infective endocarditis.  So infectious disease consult has been called. The wound cultures did grow polymicrobial organisms.    Review of Systems: Review of Systems  Constitutional:  Positive for chills and malaise/fatigue.  HENT: Negative.    Eyes: Negative.   Respiratory: Negative.    Cardiovascular: Negative.   Gastrointestinal: Negative.   Genitourinary: Negative.   Musculoskeletal: Negative.   Skin: Negative.   Neurological: Negative.   Endo/Heme/Allergies: Negative.   Psychiatric/Behavioral:  The patient is nervous/anxious.     Past Medical History:  Diagnosis Date   Acute on chronic combined systolic and diastolic CHF, NYHA class 1 (HCC) 01/13/2015   Allergy    Arthritis    Chronic kidney disease    MWF [facility].   Diabetes mellitus type 2 with complications (HCC)    Previous insulin , non-insulin  requiring noted  09/2018.  Complications include retinopathy, peripheral neuropathy.   GERD (gastroesophageal reflux disease)    at times   Head injury with loss of consciousness (HCC)    History of kidney stones    Hypertension    Neuromuscular disorder (HCC)    Neuropathy    Pulmonary hypertension (HCC)    PA peak pressure 41 mmHg 03/04/16 echo   Stroke [facility])    no residual weakness   Varicose veins of lower extremities with complications 12/24/2014    Social History   Tobacco Use   Smoking status: Former    Current packs/day: 0.00    Average packs/day: 1 pack/day for 45.0 years (45.0 ttl pk-yrs)    Types: Cigarettes    Start date: 01/12/1970    Quit date: 01/13/2015    Years since quitting: 8.8   Smokeless tobacco: Never  Vaping Use   Vaping status: Never Used  Substance Use Topics   Alcohol use: Yes    Alcohol/week: 1.0 standard drink of alcohol    Types: 1 Glasses of  wine per week    Comment: rare occassion   Drug use: No    Family History  Problem Relation Age of Onset   Arthritis Mother    Hearing loss Father    Prostate cancer Father    Hyperlipidemia Maternal Grandmother    Heart disease Maternal Grandmother    Lung cancer Maternal Grandfather    Allergies  Allergen Reactions   Codeine Anaphylaxis, Hives, Swelling and Other (See Comments)    Swelling all over body and the throat   Clindamycin /Lincomycin Nausea And Vomiting    OBJECTIVE: Blood pressure (!) 105/57, pulse 81, temperature 98.5 F (36.9 C), resp. rate (!) 33, height 5' 8 (1.727 m), weight 94.1 kg, SpO2 96%.  Physical Exam Constitutional:      Appearance: He is ill-appearing.     Comments: Patient proximally older than his stated age.  He is undergoing hemodialysis.  He appears weak and tired.  HENT:     Head: Normocephalic and atraumatic.     Right Ear: External ear normal.     Left Ear: External ear normal.     Nose: Nose normal.     Mouth/Throat:     Mouth: Mucous membranes are moist.  Eyes:     Pupils: Pupils are equal, round, and reactive to light.  Cardiovascular:     Rate and Rhythm: Normal rate and regular rhythm.  Pulmonary:     Effort: Pulmonary effort is normal.     Breath sounds: Normal breath sounds.  Abdominal:     General: Bowel sounds are normal.     Palpations: Abdomen is soft.  Musculoskeletal:        General: Deformity present. Normal range of motion.  Skin:    General: Skin is warm and dry.     Findings: Lesion present.  Neurological:     Mental Status: He is alert and oriented to person, place, and time. Mental status is at baseline.  Psychiatric:     Comments: Patient appears slightly depressed.  He appears fatigued.     Lab Results Lab Results  Component Value Date   WBC 10.1 11/01/2023   HGB 9.7 (L) 11/01/2023   HCT 30.1 (L) 11/01/2023   MCV 95.9 11/01/2023   PLT 281 11/01/2023    Lab Results  Component Value Date    CREATININE 12.73 (H) 11/01/2023   BUN 66 (H) 11/01/2023   NA 134 (L) 11/01/2023   K 5.5 (  H) 11/01/2023   CL 93 (L) 11/01/2023   CO2 24 11/01/2023    Lab Results  Component Value Date   ALT 15 10/28/2023   AST 16 10/28/2023   GGT 668 (H) 10/20/2018   ALKPHOS 215 (H) 10/28/2023   BILITOT 0.6 10/28/2023     Microbiology: Recent Results (from the past 240 hours)  Aerobic/Anaerobic Culture w Gram Stain (surgical/deep wound)     Status: None (Preliminary result)   Collection Time: 10/23/23  4:40 PM   Specimen: Wound; Tissue  Result Value Ref Range Status   Specimen Description TISSUE LEFT FOOT  Final   Special Requests NONE  Final   Gram Stain NO WBC SEEN RARE GRAM NEGATIVE RODS   Final   Culture   Final    MODERATE ENTEROCOCCUS FAECALIS FEW STENOTROPHOMONAS MALTOPHILIA FEW STAPHYLOCOCCUS AUREUS NO ANAEROBES ISOLATED Sent to [facility] for further susceptibility testing. Performed at [facility], [address], [place], [place] [address]    Report Status PENDING  Incomplete   Organism ID, Bacteria ENTEROCOCCUS FAECALIS  Final   Organism ID, Bacteria STAPHYLOCOCCUS AUREUS  Final      Susceptibility   Enterococcus faecalis - MIC*    AMPICILLIN  <=2 SENSITIVE Sensitive     VANCOMYCIN  2 SENSITIVE Sensitive     GENTAMICIN SYNERGY SENSITIVE Sensitive     * MODERATE ENTEROCOCCUS FAECALIS   Staphylococcus aureus - MIC*    CIPROFLOXACIN <=0.5 SENSITIVE Sensitive     ERYTHROMYCIN <=0.25 SENSITIVE Sensitive     GENTAMICIN <=0.5 SENSITIVE Sensitive     OXACILLIN 0.5 SENSITIVE Sensitive     TETRACYCLINE <=1 SENSITIVE Sensitive     VANCOMYCIN  <=0.5 SENSITIVE Sensitive     TRIMETH/SULFA <=10 SENSITIVE Sensitive     CLINDAMYCIN  <=0.25 SENSITIVE Sensitive     RIFAMPIN <=0.5 SENSITIVE Sensitive     Inducible Clindamycin  NEGATIVE Sensitive     LINEZOLID  2 SENSITIVE Sensitive     * FEW STAPHYLOCOCCUS AUREUS  Susceptibility, Aer + Anaerob     Status: Abnormal   Collection Time:  10/23/23  4:40 PM  Result Value Ref Range Status   Suscept, Aer + Anaerob Final report (A)  Corrected    Comment: (NOTE) Performed At: [facility] [employer] [address] [place], [place] [id number] Jennette Shorter MD Ey:[id number] CORRECTED ON 10/06 AT 0835: PREVIOUSLY REPORTED AS Preliminary report    Source STENOTROPHOMONAS MALTOPHILIA/ TISSUE LEFT FOOT  Final    Comment: Performed at [facility], [address] [place], [place] [address]  Susceptibility Result     Status: Abnormal   Collection Time: 10/23/23  4:40 PM  Result Value Ref Range Status   Suscept Result 1 Comment (A)  Final    Comment: (NOTE) Stenotrophomonas maltophilia Identification performed by account, not confirmed by this laboratory. Levofloxacin and Trimethoprim-Sulfamethoxazole should not be used alone for antimicrobial therapy (CLSI).    Antimicrobial Suscept Comment  Corrected    Comment: (NOTE)      ** S = Susceptible; I = Intermediate; R = Resistant **                   P = Positive; N = Negative            MICS are expressed in micrograms per mL   Antibiotic                 RSLT#1    RSLT#2    RSLT#3    RSLT#4 Levofloxacin  S Minocycline                    S Trimethoprim/Sulfa             S Performed At: [employer] [employer] [address] [place], [place] [id number] Jennette Shorter MD Ey:[id number]    09/25 09/26 09/27  09/28 09/29 09/30  10/01 10/02 10/03  10/04 10/05 10/06  10/07 10/08            24 Hrs:  0700 0700 0700 0700 0700 0700 0700 0700 0700 0700  0700 0700 0700 0700 0700 0700 0700 0700 0700 0700  0700      Temperature   WBC Count   WBC   WBC         7.9    9.1 9.4 9.9 10.6  11.5  11.9 10.1   WBC    Anti-infectives   Amoxicillin -Pot Clavulanate 500-125 MG (tablet)                 2 1 1 1 1    Amoxicillin -Pot Clavulanate 500-125 MG (tablet)    CeFEPIme  IJ (mg)           1,000 1,000 1,000 1,000 1,000 1,000        CeFEPIme  IJ (mg)    CeFEPIme  IV (g)         2                CeFEPIme  IV (g)    MetroNIDAZOLE  (mg)         500 1,000 1,000 1,000 500 1,000 1,000 1,000        MetroNIDAZOLE  (mg)    MetroNIDAZOLE  IV (mg)         500               MetroNIDAZOLE  IV (mg)    Vancomycin  1-5 GM/200ML-% IV (mg)          1,000     1,000         Vancomycin  1-5 GM/200ML-% IV (mg)    Vancomycin  HCl IV (mg)         2,000               Vancomycin  HCl IV (mg)    Anti-pyretics     ECHO TEE Result Date: 11/01/2023    TRANSESOPHOGEAL ECHO REPORT   Patient Name:   Edgar Brewer Date of Exam: 11/01/2023 Medical Rec #:  [id number]    Height:       68.0 in Accession #:    [id number]   Weight:       207.9 lb Date of Birth:  [birth year]    BSA:          2.078 m Patient Age:    63 years     BP:           112/39 mmHg Patient Gender: M            HR:           68 bpm. Exam Location:  Inpatient Procedure: Transesophageal Echo, 3D Echo, Cardiac Doppler and Color Doppler            (Both Spectral and Color Flow Doppler were utilized during            procedure). Indications:     Endocarditis  History:         Patient has prior history of Echocardiogram examinations, most  recent 10/31/2023. Stroke; Risk Factors:Diabetes and                  Hypertension.  Sonographer:     Jayson Gaskins Referring Phys:  [id number] MAUDE JAYSON EMMER Diagnosing Phys: Georganna Archer PROCEDURE: After discussion of the risks and benefits of a TEE, an informed consent was obtained from the patient. The transesophogeal probe was passed without difficulty through the esophogus of the patient. Sedation performed by different physician. The patient was monitored while under deep sedation. Anesthestetic sedation was provided intravenously by Anesthesiology: 250mg  of Propofol , 50mg  of Lidocaine . Image quality was excellent. The patient's vital signs; including heart rate, blood pressure, and oxygen saturation; remained stable throughout the procedure. The patient developed no complications during the procedure.  IMPRESSIONS  1. Left  ventricular ejection fraction, by estimation, is 60 to 65%. The left ventricle has normal function. The left ventricle has no regional wall motion abnormalities.  2. Right ventricular systolic function is normal. The right ventricular size is normal.  3. No left atrial/left atrial appendage thrombus was detected.  4. There is a calcified mass on the anterior MV leaflet measuring 0.8 cm x 1.3 cm that moves in concert with the anterior MV leaflet. This mass is favored to be calcific thickening of the leaflet, but an infective component cannot be ruled out. Associated with this calcified mass is an independently mobile echodensity measuring 0.4 cm x 0.8 cm present on the LV side of the anterior MV leaflet. 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 The mitral valve is abnormal. Mild mitral valve regurgitation.  5. The aortic valve is calcified. Aortic valve regurgitation is not visualized. Mild aortic valve stenosis. Aortic valve mean gradient measures 18.0 mmHg. Aortic valve Vmax measures 2.10 m/s.  6. Evidence of atrial level shunting detected by color flow Doppler. There is a small patent foramen ovale.  7. 3D performed of the mitral valve. 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 I recommend consultation to ID for further assessment/management. FINDINGS  Left Ventricle: Left ventricular ejection fraction, by estimation, is 60 to 65%. The left ventricle has normal function. The left ventricle has no regional wall motion abnormalities. The left ventricular internal cavity size was normal in size. Right Ventricle: The right ventricular size is normal. No increase in right ventricular wall thickness. Right ventricular systolic function is normal. Left Atrium: Left atrial size was normal in size. No left atrial/left atrial appendage thrombus was detected. Right Atrium: Right atrial size was  normal in size. Pericardium: Trivial pericardial effusion is present. The pericardial effusion is circumferential. Mitral Valve: There is a calcified mass on the anterior MV leaflet measuring 0.8 cm x 1.3 cm that moves in concert with the anterior MV leaflet. This mass is favored to be calcific thickening of the leaflet, but an infective component cannot be ruled out. Associated with this calcified mass is an independently mobile echodensity measuring 0.4 cm x 0.8 cm present on the LV side of the anterior MV leaflet. 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 The mitral valve is abnormal. There is severe thickening of the anterior mitral valve leaflet(s). There is severe calcification of the anterior mitral valve leaflet(s). Normal mobility of the mitral valve leaflets. Mild mitral valve regurgitation. Tricuspid Valve: The tricuspid valve is grossly normal. Tricuspid valve regurgitation is trivial. Aortic Valve: The aortic valve is calcified. Aortic valve regurgitation is not visualized. Mild aortic stenosis is  present. Aortic valve mean gradient measures 18.0 mmHg. Aortic valve peak gradient measures 17.6 mmHg. Pulmonic Valve: The pulmonic valve was grossly normal. Pulmonic valve regurgitation is trivial. Aorta: The aortic root, ascending aorta and aortic arch are all structurally normal, with no evidence of dilitation or obstruction. IAS/Shunts: The interatrial septum appears to be lipomatous. Evidence of atrial level shunting detected by color flow Doppler. A small patent foramen ovale is detected. Additional Comments: 3D was performed not requiring image post processing on an independent workstation and was abnormal. AORTIC VALVE AV Vmax:      210.00 cm/s AV Peak Grad: 17.6 mmHg AV Mean Grad: 18.0 mmHg Georganna Archer Electronically signed by Georganna Archer Signature Date/Time: 11/01/2023/11:33:50 AM    Final    EP STUDY Result Date: 11/01/2023 See surgical note for  result.  ECHOCARDIOGRAM COMPLETE Result Date: 10/31/2023    ECHOCARDIOGRAM REPORT   Patient Name:   Edgar Brewer Date of Exam: 10/31/2023 Medical Rec #:  [id number]    Height:       68.0 in Accession #:    [id number]   Weight:       207.0 lb Date of Birth:  09-25-1960    BSA:          2.074 m Patient Age:    63 years     BP:           124/51 mmHg Patient Gender: M            HR:           96 bpm. Exam Location:  Inpatient Procedure: 2D Echo, Cardiac Doppler and Color Doppler (Both Spectral and Color            Flow Doppler were utilized during procedure). Indications:    Abnormal EKG R94.31  History:        Patient has prior history of Echocardiogram examinations, most                 recent 11/17/2023. CHF, Stroke; Risk Factors:Hypertension and                 Diabetes. H/O Pulmonary hypertension, ESRD, Bradycardia, Aortic                 stenosis.  Sonographer:    BERNARDA ROCKS Referring Phys: [id number] AMRIT ADHIKARI IMPRESSIONS  1. Left ventricular ejection fraction, by estimation, is 60 to 65%. The left ventricle has normal function. The left ventricle has no regional wall motion abnormalities. Left ventricular diastolic parameters were normal. There is the interventricular septum is flattened in diastole ('D' shaped left ventricle), consistent with right ventricular volume overload.  2. Right ventricular systolic function is normal. The right ventricular size is normal.  3. There is a 1.1 x 1.5 calcified mobile echodensity attached to the base and medial aspect (A1) of the anterior MV leaflet. . The mitral valve is normal in structure. No evidence of mitral valve regurgitation. No evidence of mitral stenosis.  4. The aortic valve is normal in structure. There is moderate calcification of the aortic valve. Aortic valve regurgitation is not visualized. Mild to moderate aortic valve stenosis. Aortic valve area, by VTI measures 1.55 cm. Aortic valve mean gradient measures 15.0 mmHg. Aortic valve Vmax measures 2.83  m/s.  5. The inferior vena cava is normal in size with greater than 50% respiratory variability, suggesting right atrial pressure of 3 mmHg. FINDINGS  Left Ventricle: Left ventricular ejection fraction, by estimation, is 60 to 65%. The left  ventricle has normal function. The left ventricle has no regional wall motion abnormalities. The left ventricular internal cavity size was normal in size. There is  no left ventricular hypertrophy. The interventricular septum is flattened in diastole ('D' shaped left ventricle), consistent with right ventricular volume overload. Left ventricular diastolic parameters were normal. Right Ventricle: The right ventricular size is normal. No increase in right ventricular wall thickness. Right ventricular systolic function is normal. Left Atrium: Left atrial size was normal in size. Right Atrium: Right atrial size was normal in size. Pericardium: There is no evidence of pericardial effusion. Mitral Valve: There is a 1.1 x 1.5 calcified mobile echodensity attached to the base and medial aspect (A1) of the anterior MV leaflet. The mitral valve is normal in structure. No evidence of mitral valve regurgitation. No evidence of mitral valve stenosis. MV peak gradient, 11.3 mmHg. The mean mitral valve gradient is 4.0 mmHg. Tricuspid Valve: The tricuspid valve is normal in structure. Tricuspid valve regurgitation is trivial. No evidence of tricuspid stenosis. Aortic Valve: The aortic valve is normal in structure. There is moderate calcification of the aortic valve. Aortic valve regurgitation is not visualized. Mild to moderate aortic stenosis is present. Aortic valve mean gradient measures 15.0 mmHg. Aortic valve peak gradient measures 32.0 mmHg. Aortic valve area, by VTI measures 1.55 cm. Pulmonic Valve: The pulmonic valve was normal in structure. Pulmonic valve regurgitation is not visualized. No evidence of pulmonic stenosis. Aorta: The aortic root is normal in size and structure. Venous:  The inferior vena cava is normal in size with greater than 50% respiratory variability, suggesting right atrial pressure of 3 mmHg. IAS/Shunts: No atrial level shunt detected by color flow Doppler.  LEFT VENTRICLE PLAX 2D LVIDd:         4.30 cm      Diastology LVIDs:         3.00 cm      LV e' medial:    10.10 cm/s LV PW:         0.90 cm      LV E/e' medial:  15.0 LV IVS:        0.90 cm      LV e' lateral:   14.00 cm/s LVOT diam:     2.20 cm      LV E/e' lateral: 10.8 LV SV:         75 LV SV Index:   36 LVOT Area:     3.80 cm  LV Volumes (MOD) LV vol d, MOD A2C: 164.0 ml LV vol d, MOD A4C: 189.0 ml LV vol s, MOD A2C: 49.3 ml LV vol s, MOD A4C: 56.7 ml LV SV MOD A2C:     114.7 ml LV SV MOD A4C:     189.0 ml LV SV MOD BP:      124.6 ml RIGHT VENTRICLE             IVC RV Basal diam:  3.20 cm     IVC diam: 1.90 cm RV S prime:     12.90 cm/s TAPSE (M-mode): 1.4 cm LEFT ATRIUM             Index        RIGHT ATRIUM           Index LA diam:        3.90 cm 1.88 cm/m   RA Area:     14.40 cm LA Vol (A2C):   42.0 ml 20.25 ml/m  RA Volume:  34.70 ml  16.73 ml/m LA Vol (A4C):   71.8 ml 34.62 ml/m LA Biplane Vol: 57.0 ml 27.48 ml/m  AORTIC VALVE                     PULMONIC VALVE AV Area (Vmax):    1.57 cm      PV Vmax:          1.23 m/s AV Area (Vmean):   1.55 cm      PV Peak grad:     6.1 mmHg AV Area (VTI):     1.55 cm      PR End Diast Vel: 1.07 msec AV Vmax:           283.00 cm/s AV Vmean:          180.000 cm/s AV VTI:            0.485 m AV Peak Grad:      32.0 mmHg AV Mean Grad:      15.0 mmHg LVOT Vmax:         117.00 cm/s LVOT Vmean:        73.300 cm/s LVOT VTI:          0.198 m LVOT/AV VTI ratio: 0.41  AORTA Ao Root diam: 3.00 cm Ao Asc diam:  3.20 cm MITRAL VALVE MV Area (PHT): 5.84 cm     SHUNTS MV Area VTI:   2.47 cm     Systemic VTI:  0.20 m MV Peak grad:  11.3 mmHg    Systemic Diam: 2.20 cm MV Mean grad:  4.0 mmHg MV Vmax:       1.68 m/s MV Vmean:      88.2 cm/s MV Decel Time: 130 msec MV E velocity:  151.00 cm/s MV A velocity: 41.40 cm/s MV E/A ratio:  3.65 Franck Azobou Tonleu Electronically signed by Joelle Cedars Tonleu Signature Date/Time: 10/31/2023/9:23:36 AM    Final    DG Foot 2 Views Left Result Date: 10/23/2023 CLINICAL DATA:  Postop EXAM: LEFT FOOT - 2 VIEW COMPARISON:  10/19/2023 FINDINGS: Interval transmetatarsal amputation of the first through fifth digits. Cutaneous staples. Vascular calcifications. IMPRESSION: Interval transmetatarsal amputation of the first through fifth digits. Electronically Signed   By: Luke Bun M.D.   On: 10/23/2023 19:52   MR FOOT LEFT WO CONTRAST Result Date: 10/21/2023 CLINICAL DATA:  eval for 1st-3rd ray osteomyelitis Left 2nd toe amputation 09/21/2023. EXAM: MRI OF THE LEFT FOOT WITHOUT CONTRAST TECHNIQUE: Multiplanar, multisequence MR imaging of the left forefoot was performed. No intravenous contrast was administered. COMPARISON:  Multiple foot radiographs, most recently performed 09/21/2023, 10/17/2023 and 10/19/2023. No previous MRI available. FINDINGS: Technical note: Despite efforts by the technologist and patient, mild motion artifact is present on today's exam and could not be eliminated. This reduces exam sensitivity and specificity. Motion greatest on the sagittal images. Bones/Joint/Cartilage Status post amputation of the 2nd toe through the base of the proximal phalanx. Nonspecific marrow T2 hyperintensity within the remaining 2nd proximal phalangeal base and 2nd metatarsal head with a small to moderate effusion of the 2nd metatarsophalangeal joint. No evidence of osteomyelitis within the 1st, 3rd, 4th or 5th rays. There are mild degenerative changes at the 1st metatarsophalangeal joint. There are additional greater degenerative changes in the midfoot with scattered subchondral cyst formation in the metatarsal bases and cuneiforms. The alignment is normal at the Lisfranc joint. Ligaments Intact Lisfranc ligament. The collateral ligaments of the  metatarsophalangeal joints appear intact. Muscles and Tendons  Mild nonspecific forefoot muscular edema. Laxity and retraction of the flexor tendons to the 2nd ray attributed to previous amputation. No significant tenosynovitis. Soft tissues Postsurgical changes related to recent 2nd toe amputation with possible incomplete wound healing dorsally. No organized fluid collection identified. There is mild dorsal subcutaneous edema. Prominent vascular calcifications on accompanying recent radiographs. IMPRESSION: 1. Postsurgical changes related to recent 2nd toe amputation with possible incomplete wound healing dorsally. No organized fluid collection identified. 2. Nonspecific marrow T2 hyperintensity within the remaining 2nd proximal phalangeal base and 2nd metatarsal head with a small to moderate effusion of the 2nd metatarsophalangeal joint. Findings are nonspecific and could be postsurgical or secondary to early osteomyelitis. 3. No evidence of osteomyelitis within the 1st, 3rd, 4th or 5th rays. 4. Mild nonspecific forefoot muscular edema. 5. Mild degenerative changes in the midfoot and 1st metatarsophalangeal joint. Electronically Signed   By: Elsie Perone M.D.   On: 10/21/2023 10:42   VAS US  LOWER EXTREMITY VENOUS (DVT) (ONLY [facility] & [facility]) Result Date: 10/19/2023  Lower Venous DVT Study Patient Name:  Edgar Brewer  Date of Exam:   10/19/2023 Medical Rec #: [id number]     Accession #:    [id number] Date of Birth: Nov 22, 1960     Patient Gender: M Patient Age:   73 years Exam Location:  [facility] Procedure:      VAS US  LOWER EXTREMITY VENOUS (DVT) Referring Phys: LONNI SAKAI --------------------------------------------------------------------------------  Indications: Swelling, Edema, Erythema, and ulceration.  Limitations: Body habitus and poor ultrasound/tissue interface. Comparison Study: Previous study on 4.24.2022. Performing Technologist: Edilia Elden Appl  Examination Guidelines: A complete  evaluation includes B-mode imaging, spectral Doppler, color Doppler, and power Doppler as needed of all accessible portions of each vessel. Bilateral testing is considered an integral part of a complete examination. Limited examinations for reoccurring indications may be performed as noted. The reflux portion of the exam is performed with the patient in reverse Trendelenburg.  +-----+---------------+---------+-----------+----------+--------------+ RIGHTCompressibilityPhasicitySpontaneityPropertiesThrombus Aging +-----+---------------+---------+-----------+----------+--------------+ CFV  Full           Yes      Yes                                 +-----+---------------+---------+-----------+----------+--------------+ SFJ  Full           Yes      Yes                                 +-----+---------------+---------+-----------+----------+--------------+   +---------+---------------+---------+-----------+----------+--------------+ LEFT     CompressibilityPhasicitySpontaneityPropertiesThrombus Aging +---------+---------------+---------+-----------+----------+--------------+ CFV      Full           Yes      Yes                                 +---------+---------------+---------+-----------+----------+--------------+ SFJ      Full           Yes      Yes                                 +---------+---------------+---------+-----------+----------+--------------+ FV Prox  Full                                                        +---------+---------------+---------+-----------+----------+--------------+  FV Mid   Full                                                        +---------+---------------+---------+-----------+----------+--------------+ FV DistalFull                                                        +---------+---------------+---------+-----------+----------+--------------+ PFV      Full                                                         +---------+---------------+---------+-----------+----------+--------------+ POP      Full           Yes      Yes                                 +---------+---------------+---------+-----------+----------+--------------+ PTV      Full                                                        +---------+---------------+---------+-----------+----------+--------------+ PERO     Full                                                        +---------+---------------+---------+-----------+----------+--------------+    Summary: RIGHT: - No evidence of common femoral vein obstruction.   LEFT: - There is no evidence of deep vein thrombosis in the lower extremity.  - No cystic structure found in the popliteal fossa.  *See table(s) above for measurements and observations. Electronically signed by Lonni Gaskins MD on 10/19/2023 at 2:26:06 PM.    Final    DG Foot Complete Left Result Date: 10/19/2023 CLINICAL DATA:  Sepsis.  Possible osteomyelitis. EXAM: LEFT FOOT - COMPLETE 3+ VIEW COMPARISON:  10/17/2023 FINDINGS: Mild diffuse decreased bone mineralization is present. Evidence of previous amputation distal to the base of the second proximal phalanx unchanged. Degenerative changes over the midfoot and hindfoot as well as tibiotalar joint which are stable. Small vessel atherosclerotic disease is present. No evidence to suggest osteomyelitis. Minimal diffuse soft tissue swelling over the forefoot. IMPRESSION: 1. No acute findings. No evidence of osteomyelitis. 2. Degenerative changes as described. 3. Minimal diffuse soft tissue swelling over the forefoot. Electronically Signed   By: Toribio Agreste M.D.   On: 10/19/2023 09:13   DG Chest Port 1 View Result Date: 10/19/2023 CLINICAL DATA:  Questionable sepsis. EXAM: PORTABLE CHEST 1 VIEW COMPARISON:  04/14/2023 FINDINGS: Lungs are adequately inflated without focal airspace consolidation or effusion. Cardiomediastinal silhouette and remainder of the exam is  unchanged. IMPRESSION: No active disease. Electronically Signed   By:  Toribio Agreste M.D.   On: 10/19/2023 09:11   DG Foot Complete Left Result Date: 10/17/2023 Please see detailed radiograph report in office note.  PERIPHERAL VASCULAR CATHETERIZATION Result Date: 09/23/2023 Table formatting from the original result was not included. Images from the original result were not included. DATE OF SERVICE: 09/20/2023  PATIENT:  Edgar Brewer  63 y.o. male  PRE-OPERATIVE DIAGNOSIS:  Atherosclerosis of native arteries of left lower extremity causing gangrene  POST-OPERATIVE DIAGNOSIS:  Same  PROCEDURE:  1) Ultrasound guided right common femoral access (CPT [phone]) 2) Aortogram (CPT [id number]) 3) Left lower extremity angiogram with second order cannulation (CPT [phone]) 4) Additional left lower extremity angiogram with third order cannulation (CPT [phone]) 5) Left posterior tibial angioplasty - 3x220 [place] (CPT [phone])  SURGEON:  Debby SAILOR. Magda, MD  ASSISTANT: none  ANESTHESIA:   local  ESTIMATED BLOOD LOSS: min  LOCAL MEDICATIONS USED:  LIDOCAINE   COUNTS: confirmed correct.  PATIENT DISPOSITION:  PACU - hemodynamically stable.  Delay start of Pharmacological VTE agent (>24hrs) due to surgical blood loss or risk of bleeding: no  INDICATION FOR PROCEDURE: Edgar Brewer is a 63 y.o. male with left toe gangrene and non-invasive evidence of peripheral arterial disease. After careful discussion of risks, benefits, and alternatives the patient was offered angiogram. The patient understood and wished to proceed.  OPERATIVE FINDINGS:  Left renal artery: not seen Right renal artery: not seen Infrarenal aorta: patent Left common iliac artery: patent Right common iliac artery: patent Left internal iliac artery: patent Right internal iliac artery: patent Left external iliac artery: patent Right external iliac artery: patent Left common femoral artery: patent Right common femoral artery: not studied Left profunda femoris artery: patent - ?  AV fistula between femoral artery and femoral vein Right profunda femoris artery: not studied Left superficial femoral artery: patent Right superficial femoral artery: not studied Left popliteal artery: patent Right popliteal artery: not studied Left anterior tibial artery: patent - dominant tibial. Continues into foot as DP.  Right anterior tibial artery: not studied Left tibioperoneal trunk: patent Right tibioperoneal trunk: not studied Left peroneal artery: patent, but diminutive to the ankle Right peroneal artery: not studied Left posterior tibial artery: severely diseased throughout its length, greatest stenosis 95%. Calcified. Fills plantar vessels Right posterior tibial artery: not studied Left pedal circulation: severely disadvantaged. Right pedal circulation: not studied  GLASS score. FP 0. IP 0. Stage 1.  WIfI score. 2 / 0 / 1. Stage II.  DESCRIPTION OF PROCEDURE: After identification of the patient in the pre-operative holding area, the patient was transferred to the operating room. The patient was positioned supine on the operating room table. The groins was prepped and draped in standard fashion. A surgical pause was performed confirming correct patient, procedure, and operative location.  The right groin was anesthetized with subcutaneous injection of 1% lidocaine . Using ultrasound guidance, the right common femoral artery was accessed with micropuncture technique. The 66F micropuncture sheath was upsized to 17F.  A Benson wire was advanced into the distal aorta. Over the wire an omni flush catheter was advanced to the level of L2. Aortogram was performed - see above for details.  The left common iliac artery was selected with an omniflush catheter and glidewire guidewire. The wire was advanced into the common femoral artery. Over the wire the omni flush catheter was advanced into the external iliac artery. Selective angiography was performed - see above for details.  The decision was made to intervene.  The patient was heparinized  with 10,000 units of heparin . The 574F sheath was exchanged for a 574F x 90cm sheath. Selective angiography of the left lower extremity extremity performed prior to intervention.  The lesions were treated with: Angioplasty of the left posterior tibial artery.  Completion angiography revealed: Resolution of PT disease; persistence of severe pedal disease.  The sheath was left in place to be removed in the recovery area.  Upon completion of the case instrument and sharps counts were confirmed correct. The patient was transferred to the PACU in good condition. I was present for all portions of the procedure.  PLAN: ASA / Plavix  / Statin. Severe pedal disease. Optimized for toe amputation. High risk of needing more proximal amputation, but this cannot be further reduced by vascular intervention.  Debby SAILOR. Magda, MD FACS [facility] Phone Number: [phone] 09/20/2023 10:15 AM      DG Foot 2 Views Left Result Date: 09/21/2023 CLINICAL DATA:  Postoperative state. EXAM: LEFT FOOT - 2 VIEW COMPARISON:  Left foot radiograph dated 09/19/2023. FINDINGS: Status post amputation at the base of the proximal phalanx of the second digit. No acute fracture or dislocation. Old fracture of the distal fifth metatarsal. The bones are osteopenic. Vascular calcifications noted. The soft tissues are grossly unremarkable. Overlying dressing noted. IMPRESSION: Status post amputation at the base of the proximal phalanx of the second digit. Electronically Signed   By: Vanetta Chou M.D.   On: 09/21/2023 13:51   DG Foot Complete Left Result Date: 09/19/2023 CLINICAL DATA:  Left foot ulcer.  Possible gangrene. EXAM: LEFT FOOT - COMPLETE 3+ VIEW COMPARISON:  None Available. FINDINGS: There is no evidence of fracture or dislocation. There is no evidence of arthropathy or other focal bone abnormality. Vascular calcifications are noted. IMPRESSION: No fracture or dislocation  is noted. No definite bony abnormality is noted. Electronically Signed   By: Lynwood Landy Raddle M.D.   On: 09/19/2023 12:48   DG Foot Complete Left Result Date: 08/29/2023 CLINICAL DATA:  2nd toe ulceration/cellulitis. EXAM: LEFT FOOT - COMPLETE 3+ VIEW COMPARISON:  None Available. FINDINGS: Diffuse vascular calcifications. No acute bony abnormality. Specifically, no fracture, subluxation, or dislocation. No bone destruction to suggest osteomyelitis. Joint spaces maintained. IMPRESSION: No acute bony abnormality. Diffuse vascular calcifications. Electronically Signed   By: Franky Crease M.D.   On: 08/29/2023 15:25    Adham Johnson Zoanne, MD  [facility] Cell [phone].  11/01/2023 3:41 PM    Total Encounter Time: 80 minutes

## 2023-11-01 NOTE — H&P (View-Only) (Signed)
 Subjective:  No complaints. Pain control LLE good   Objective:  Vitals:   10/31/23 2017 11/01/23 0453 11/01/23 0600 11/01/23 0831  BP: (S) (!) 114/54 (S) (!) 113/55  (!) 106/51  Pulse: 91 81  80  Resp: 18 20  18   Temp: 98.5 F (36.9 C) 97.8 F (36.6 C)  (!) 97.5 F (36.4 C)  TempSrc:  Oral  Oral  SpO2: 93% 96%  97%  Weight:   94.3 kg   Height:        Intake/Output from previous day:  Intake/Output Summary (Last 24 hours) at 11/01/2023 0857 Last data filed at 11/01/2023 0006 Gross per 24 hour  Intake 600 ml  Output 0 ml  Net 600 ml    Physical Exam:  Chronically ill male no distress Lungs clear AS murmur Abdomen benign No edema Post amputation left forefoot Left brachiocephalic fistula   Lab Results: Basic Metabolic Panel: Recent Labs    10/30/23 0535 11/01/23 0205  NA 132* 134*  K 5.6* 5.5*  CL 91* 93*  CO2 24 24  GLUCOSE 116* 224*  BUN 71* 66*  CREATININE 13.90* 12.73*  CALCIUM  9.4 9.2  MG 2.4  --   PHOS  --  7.9*   Liver Function Tests: Recent Labs    11/01/23 0205  ALBUMIN 2.5*   No results for input(s): LIPASE, AMYLASE in the last 72 hours. CBC: Recent Labs    10/30/23 0900 11/01/23 0205  WBC 11.9* 10.1  NEUTROABS  --  8.0*  HGB 9.9* 9.7*  HCT 30.6* 30.1*  MCV 95.6 95.9  PLT 271 281    Thyroid  Function Tests: No results for input(s): TSH, T4TOTAL, T3FREE, THYROIDAB in the last 72 hours.  Invalid input(s): FREET3  Anemia Panel: No results for input(s): VITAMINB12, FOLATE, FERRITIN, TIBC, IRON, RETICCTPCT in the last 72 hours.  Imaging: ECHOCARDIOGRAM COMPLETE Result Date: 10/31/2023    ECHOCARDIOGRAM REPORT   Patient Name:   Edgar Brewer Date of Exam: 10/31/2023 Medical Rec #:  [id number]    Height:       68.0 in Accession #:    [id number]   Weight:       207.0 lb Date of Birth:  Jun 22, 1960    BSA:          2.074 m Patient Age:    63 years     BP:           124/51 mmHg Patient Gender: M            HR:            96 bpm. Exam Location:  Inpatient Procedure: 2D Echo, Cardiac Doppler and Color Doppler (Both Spectral and Color            Flow Doppler were utilized during procedure). Indications:    Abnormal EKG R94.31  History:        Patient has prior history of Echocardiogram examinations, most                 recent 11/17/2023. CHF, Stroke; Risk Factors:Hypertension and                 Diabetes. H/O Pulmonary hypertension, ESRD, Bradycardia, Aortic                 stenosis.  Sonographer:    BERNARDA ROCKS Referring Phys: [id number] AMRIT ADHIKARI IMPRESSIONS  1. Left ventricular ejection fraction, by estimation, is 60 to 65%. The left ventricle has normal function. The  left ventricle has no regional wall motion abnormalities. Left ventricular diastolic parameters were normal. There is the interventricular septum is flattened in diastole ('D' shaped left ventricle), consistent with right ventricular volume overload.  2. Right ventricular systolic function is normal. The right ventricular size is normal.  3. There is a 1.1 x 1.5 calcified mobile echodensity attached to the base and medial aspect (A1) of the anterior MV leaflet. . The mitral valve is normal in structure. No evidence of mitral valve regurgitation. No evidence of mitral stenosis.  4. The aortic valve is normal in structure. There is moderate calcification of the aortic valve. Aortic valve regurgitation is not visualized. Mild to moderate aortic valve stenosis. Aortic valve area, by VTI measures 1.55 cm. Aortic valve mean gradient measures 15.0 mmHg. Aortic valve Vmax measures 2.83 m/s.  5. The inferior vena cava is normal in size with greater than 50% respiratory variability, suggesting right atrial pressure of 3 mmHg. FINDINGS  Left Ventricle: Left ventricular ejection fraction, by estimation, is 60 to 65%. The left ventricle has normal function. The left ventricle has no regional wall motion abnormalities. The left ventricular internal cavity size was  normal in size. There is  no left ventricular hypertrophy. The interventricular septum is flattened in diastole ('D' shaped left ventricle), consistent with right ventricular volume overload. Left ventricular diastolic parameters were normal. Right Ventricle: The right ventricular size is normal. No increase in right ventricular wall thickness. Right ventricular systolic function is normal. Left Atrium: Left atrial size was normal in size. Right Atrium: Right atrial size was normal in size. Pericardium: There is no evidence of pericardial effusion. Mitral Valve: There is a 1.1 x 1.5 calcified mobile echodensity attached to the base and medial aspect (A1) of the anterior MV leaflet. The mitral valve is normal in structure. No evidence of mitral valve regurgitation. No evidence of mitral valve stenosis. MV peak gradient, 11.3 mmHg. The mean mitral valve gradient is 4.0 mmHg. Tricuspid Valve: The tricuspid valve is normal in structure. Tricuspid valve regurgitation is trivial. No evidence of tricuspid stenosis. Aortic Valve: The aortic valve is normal in structure. There is moderate calcification of the aortic valve. Aortic valve regurgitation is not visualized. Mild to moderate aortic stenosis is present. Aortic valve mean gradient measures 15.0 mmHg. Aortic valve peak gradient measures 32.0 mmHg. Aortic valve area, by VTI measures 1.55 cm. Pulmonic Valve: The pulmonic valve was normal in structure. Pulmonic valve regurgitation is not visualized. No evidence of pulmonic stenosis. Aorta: The aortic root is normal in size and structure. Venous: The inferior vena cava is normal in size with greater than 50% respiratory variability, suggesting right atrial pressure of 3 mmHg. IAS/Shunts: No atrial level shunt detected by color flow Doppler.  LEFT VENTRICLE PLAX 2D LVIDd:         4.30 cm      Diastology LVIDs:         3.00 cm      LV e' medial:    10.10 cm/s LV PW:         0.90 cm      LV E/e' medial:  15.0 LV IVS:         0.90 cm      LV e' lateral:   14.00 cm/s LVOT diam:     2.20 cm      LV E/e' lateral: 10.8 LV SV:         75 LV SV Index:   36 LVOT Area:  3.80 cm  LV Volumes (MOD) LV vol d, MOD A2C: 164.0 ml LV vol d, MOD A4C: 189.0 ml LV vol s, MOD A2C: 49.3 ml LV vol s, MOD A4C: 56.7 ml LV SV MOD A2C:     114.7 ml LV SV MOD A4C:     189.0 ml LV SV MOD BP:      124.6 ml RIGHT VENTRICLE             IVC RV Basal diam:  3.20 cm     IVC diam: 1.90 cm RV S prime:     12.90 cm/s TAPSE (M-mode): 1.4 cm LEFT ATRIUM             Index        RIGHT ATRIUM           Index LA diam:        3.90 cm 1.88 cm/m   RA Area:     14.40 cm LA Vol (A2C):   42.0 ml 20.25 ml/m  RA Volume:   34.70 ml  16.73 ml/m LA Vol (A4C):   71.8 ml 34.62 ml/m LA Biplane Vol: 57.0 ml 27.48 ml/m  AORTIC VALVE                     PULMONIC VALVE AV Area (Vmax):    1.57 cm      PV Vmax:          1.23 m/s AV Area (Vmean):   1.55 cm      PV Peak grad:     6.1 mmHg AV Area (VTI):     1.55 cm      PR End Diast Vel: 1.07 msec AV Vmax:           283.00 cm/s AV Vmean:          180.000 cm/s AV VTI:            0.485 m AV Peak Grad:      32.0 mmHg AV Mean Grad:      15.0 mmHg LVOT Vmax:         117.00 cm/s LVOT Vmean:        73.300 cm/s LVOT VTI:          0.198 m LVOT/AV VTI ratio: 0.41  AORTA Ao Root diam: 3.00 cm Ao Asc diam:  3.20 cm MITRAL VALVE MV Area (PHT): 5.84 cm     SHUNTS MV Area VTI:   2.47 cm     Systemic VTI:  0.20 m MV Peak grad:  11.3 mmHg    Systemic Diam: 2.20 cm MV Mean grad:  4.0 mmHg MV Vmax:       1.68 m/s MV Vmean:      88.2 cm/s MV Decel Time: 130 msec MV E velocity: 151.00 cm/s MV A velocity: 41.40 cm/s MV E/A ratio:  3.65 Franck Azobou Tonleu Electronically signed by Joelle Cedars Tonleu Signature Date/Time: 10/31/2023/9:23:36 AM    Final     Cardiac Studies:  ECG: SR rate 69 wenkebach   Telemetry: SR rates 80-90  Echo: Reviewed 10/7 EF 60-65% 1.1 x 1.5 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  mild/mod AS    Medications:    (feeding supplement) PROSource Plus  30 mL Oral BID BM   aspirin  EC  81 mg Oral Daily   Chlorhexidine  Gluconate Cloth  6 each Topical Q0600   cinacalcet   60 mg Oral Q T,Th,Sat-1800   darbepoetin (  ARANESP ) injection - DIALYSIS  60 mcg Subcutaneous Q Fri-1800   gabapentin   100 mg Oral TID   heparin  injection (subcutaneous)  5,000 Units Subcutaneous Q8H   insulin  aspart  0-6 Units Subcutaneous TID WC   insulin  aspart  4 Units Subcutaneous TID WC   insulin  glargine  50 Units Subcutaneous Daily   multivitamin  1 tablet Oral QHS   mupirocin  cream   Topical QODAY   pantoprazole   40 mg Oral Daily   polyethylene glycol  17 g Oral Daily   rosuvastatin   10 mg Oral Daily   senna-docusate  1 tablet Oral BID   sevelamer  carbonate  1,600 mg Oral TID WC      sodium chloride  Stopped (10/31/23 2230)    Assessment/Plan:   ? SBE:  admitted with sepsis and left foot infection growing multiple organisms including enterococcus, and staph on path. BC;s negative 9/25 TTE with mass on anterior leaflet of MV that appears more chronic and calcified. TEE scheduled this am with Dr Floretta.  If vegetation suggested would need ID recs for antibiotic coverage for 4-6 weeks. He is not a candidate for surgery due to co morbidities and deformity not causing any significant MR Bradycardia:  seen by Dr Cindie earlier this admission for such. Wenkebach rhtym with no long pauses. No further monitoring or w/u suggested CRF:  continue dialysis via left brachiocephalic access Today is dialysis day can do after TEE this am scheduled for 10:00  Edgar Brewer 11/01/2023, 8:57 AM

## 2023-11-01 NOTE — Progress Notes (Signed)
 0730: This nurse assumes care of this patient at this time. N-N report received from [place], [place]: No major changes per assessment. Patient has remained NPO since midnight d/t TEE scheduled today. Patient understands plan of care and has been more pleasant than recent encounters. 9195: Cath Holding calls. All questions answered: NPO, Cognition, O2 Requirements, IV, Mode of transportation, Infusions. RN states patient should placed in transport within next 30-62m 1129: Report from Cath Lab RN Vegetation, PFO, BP: initially soft back to baseline, neuro check  1205: This nurse responds to patient's bathroom alarm sounding. Patient is found standing in bathroom with walker to side of him. Stool on floor, toilet seat and on patient. This nurse cleans patient, floor and seat. Prior to, patient safely placed back in bed with encouragement for NWB to left foot. At this time, report given to Rosser, RN (HD) and she states patient will be placed in for transport soon. Patient and mother bedside aware.  1415: Patient to HD at this time, escorted by transport staff at this time  1830: Report received from HD RN at this time: states BP dropped slightly but back to patient's baseline. Hypoxic on arrival to floor, maintained O2 with 2L throughout HD, 90s on RA just prior to transport. Pt en route to unit.  1842: Patient received to unit, Misty, NT to bedside to retreive vitals and blood sugar.   Please see significant event note for remainder of time on 61M.   Kendan Cornforth, RN

## 2023-11-01 NOTE — Transfer of Care (Signed)
 Immediate Anesthesia Transfer of Care Note  Patient: Edgar Brewer  Procedure(s) Performed: TRANSESOPHAGEAL ECHOCARDIOGRAM  Patient Location: PACU  Anesthesia Type:MAC  Level of Consciousness: sedated  Airway & Oxygen Therapy: Patient connected to nasal cannula oxygen  Post-op Assessment: Report given to RN and Post -op Vital signs reviewed and stable  Post vital signs: Reviewed and stable  Last Vitals:  Vitals Value Taken Time  BP 140/106 11/01/23 10:40  Temp    Pulse 82 11/01/23 10:41  Resp 30 11/01/23 10:41  SpO2 96 % 11/01/23 10:41  Vitals shown include unfiled device data.  Last Pain:  Vitals:   11/01/23 0911  TempSrc: Temporal  PainSc:       Patients Stated Pain Goal: 0 (10/26/23 0130)  Complications: No notable events documented.

## 2023-11-01 NOTE — Significant Event (Signed)
 Rapid Response Event Note   Reason for Call :  O2 desat, decreased LOC and Temp 101 Recently arrived from HD  Initial Focused Assessment:  Patient is drowsy and generally weak.  He is mildly diaphoretic.  He is belly breathing.  Lung sounds decreased, Heart tones regular.  BP 105/59  HR 106  RR 29  O2 sat 100% on 4L [place]  101.7 rectal   Interventions:  12 lead EKG done Code Sepsis Activated Labs/blood cultures drawn prior to ABX given PCXR Albuterol treatment Tylenol  PR ABXs started 500 cc NS bolus PIV started  Transferred to 3E25  Plan of Care:     Event Summary:   MD Notified: Dr Vira Call Time: 1912 Arrival Time: 1915 End Time: 2100  Elvin [place], RN

## 2023-11-01 NOTE — Progress Notes (Signed)
 Pharmacy Antibiotic Note  Edgar Brewer is a 63 y.o. male admitted on 10/19/2023 with a chief complaint of sepsis, worsening left foot pain, cough, hypoxia, and fatigue.  Pharmacy has been consulted for vancomycin  and levofloxacin dosing in the setting of ESRD on dialysis (usually MWF schedule).   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    Plan: - Vancomycin  IV 2000 mg x1 loading dose, followed by vancomycin  IV 1000 mg with HD sessions (MWF) - Vancomycin  random level ordered for 11/02/2023 - Monitor clinical progression, length of therapy, and future vancomycin  levels as needed   - Levofloxacin IV 750 mg x1 loading dose, followed by levofloxacin IV 500 mg every 48 hours   Height: 5' 8 (172.7 cm) Weight: 94.1 kg (207 lb 7.3 oz) IBW/kg (Calculated) : 68.4  Temp (24hrs), Avg:98 F (36.7 C), Min:97.5 F (36.4 C), Max:98.5 F (36.9 C)  Recent Labs  Lab 10/27/23 0324 10/28/23 0751 10/28/23 1542 10/29/23 0310 10/30/23 0535 10/30/23 0900 11/01/23 0205  WBC 10.6* 12.9* 11.5*  --   --  11.9* 10.1  CREATININE 11.18* 8.70* 2.57* 11.05* 13.90*  --  12.73*  LATICACIDVEN  --   --  0.9  --   --   --   --     Estimated Creatinine Clearance: 6.5 mL/min (A) (by C-G formula based on SCr of 12.73 mg/dL (H)).    Allergies  Allergen Reactions   Codeine Anaphylaxis, Hives, Swelling and Other (See Comments)    Swelling all over body and the throat   Clindamycin /Lincomycin Nausea And Vomiting    Antimicrobials this admission: 9/25 cefepime  >> 10/2 9/25 metronidazole  >> 10/2 9/25 vancomycin  >> 10/3  10/3 amoxicillin /clavulanate >> 10/7 10/8 vancomycin  >>  10/8 levofloxacin >>  Microbiology results: 9/25 BCx: NGTD x5 days 9/29 tissue of left foot cx: Enterococcus faecalis (pan-sensitive), MSSA (pan-sensitive), Stenotrophomonas maltophilia (pan-sensitive)  Thank you for allowing pharmacy to be a part of  this patient's care.  Feliciano Close, PharmD PGY2 Infectious Diseases Pharmacy Resident  11/01/2023 4:39 PM

## 2023-11-01 NOTE — Interval H&P Note (Signed)
 History and Physical Interval Note:  11/01/2023 10:02 AM  Edgar Brewer  has presented today for surgery, with the diagnosis of R/O SBE.  The various methods of treatment have been discussed with the patient and family. After consideration of risks, benefits and other options for treatment, the patient has consented to  Procedure(s): TRANSESOPHAGEAL ECHOCARDIOGRAM (N/A) as a surgical intervention.  The patient's history has been reviewed, patient examined, no change in status, stable for surgery.  I have reviewed the patient's chart and labs.  Questions were answered to the patient's satisfaction.     Georganna Archer

## 2023-11-01 NOTE — Anesthesia Preprocedure Evaluation (Addendum)
 Anesthesia Evaluation  Patient identified by MRN, date of birth, ID band Patient awake    Reviewed: Allergy & Precautions, NPO status , Patient's Chart, lab work & pertinent test results  History of Anesthesia Complications Negative for: history of anesthetic complications  Airway Mallampati: III  TM Distance: >3 FB Neck ROM: Full    Dental  (+) Dental Advisory Given   Pulmonary former smoker   Pulmonary exam normal        Cardiovascular hypertension, pulmonary hypertension+ Peripheral Vascular Disease  Normal cardiovascular exam+ dysrhythmias Atrial Fibrillation + Valvular Problems/Murmurs AS    '25 TTE - EF 60 to 65%. There is the interventricular septum is flattened in diastole ('D' shaped left ventricle), consistent with right ventricular volume overload. There is a 1.1 x 1.5 calcified mobile echodensity attached to the base and medial aspect (A1) of the anterior MV leaflet. Mild to moderate aortic valve stenosis. Aortic valve area, by VTI measures 1.55 cm. Aortic valve mean gradient measures 15.0 mmHg. Aortic valve Vmax measures 2.83 m/s.      Neuro/Psych  Neuromuscular disease CVA  negative psych ROS   GI/Hepatic Neg liver ROS,GERD  Medicated and Controlled,,  Endo/Other  diabetes, Type 2, Insulin  Dependent   Obesity K 5.5   Renal/GU ESRF and DialysisRenal disease     Musculoskeletal  (+) Arthritis ,    Abdominal   Peds  Hematology  (+) Blood dyscrasia, anemia   Anesthesia Other Findings   Reproductive/Obstetrics                              Anesthesia Physical Anesthesia Plan  ASA: 3  Anesthesia Plan: MAC   Post-op Pain Management: Minimal or no pain anticipated   Induction:   PONV Risk Score and Plan: 1 and Propofol  infusion and Treatment may vary due to age or medical condition  Airway Management Planned: Nasal Cannula and Natural Airway  Additional Equipment:  None  Intra-op Plan:   Post-operative Plan:   Informed Consent: I have reviewed the patients History and Physical, chart, labs and discussed the procedure including the risks, benefits and alternatives for the proposed anesthesia with the patient or authorized representative who has indicated his/her understanding and acceptance.       Plan Discussed with: CRNA and Anesthesiologist  Anesthesia Plan Comments:          Anesthesia Quick Evaluation

## 2023-11-01 NOTE — Plan of Care (Signed)
  Problem: Coping: Goal: Ability to adjust to condition or change in health will improve Outcome: Progressing   Problem: Tissue Perfusion: Goal: Adequacy of tissue perfusion will improve Outcome: Progressing   Problem: Clinical Measurements: Goal: Ability to maintain clinical measurements within normal limits will improve Outcome: Progressing

## 2023-11-01 NOTE — CV Procedure (Signed)
    TRANSESOPHAGEAL ECHOCARDIOGRAM   NAME:  Omran Keelin    MRN: [id number] DOB:  07/30/1960    ADMIT DATE: 10/19/2023  INDICATIONS: Bacteremia  PROCEDURE:   Informed consent was obtained prior to the procedure. The risks, benefits and alternatives for the procedure were discussed and the patient comprehended these risks.  Risks include, but are not limited to, cough, sore throat, vomiting, nausea, somnolence, esophageal and stomach trauma or perforation, bleeding, low blood pressure, aspiration, pneumonia, infection, trauma to the teeth and death.    Procedural time out performed. The oropharynx was anesthetized with viscous lidocaine .  Anesthesia was administered by the anaesthesilogy team. The patient's heart rate, blood pressure, and oxygen saturation were monitored continuously during the procedure.  The transesophageal probe was inserted in the esophagus and stomach without difficulty and multiple views were obtained including 3 dimensional imaging.  The patient tolerated the procedure well.  COMPLICATIONS:    There were no immediate complications.  KEY FINDINGS:  Mobile echodensity on anterior leaflet of MV.  Full report to follow.   Nysir Fergusson T. Floretta HEATH, MD [place]  [facility]  10:39 AM

## 2023-11-02 ENCOUNTER — Encounter (HOSPITAL_COMMUNITY): Payer: Self-pay | Admitting: Student in an Organized Health Care Education/Training Program

## 2023-11-02 ENCOUNTER — Encounter: Payer: Medicare (Managed Care) | Admitting: Podiatry

## 2023-11-02 DIAGNOSIS — I059 Rheumatic mitral valve disease, unspecified: Secondary | ICD-10-CM | POA: Diagnosis not present

## 2023-11-02 DIAGNOSIS — A419 Sepsis, unspecified organism: Secondary | ICD-10-CM | POA: Diagnosis not present

## 2023-11-02 LAB — CBC
HCT: 34.1 % — ABNORMAL LOW (ref 39.0–52.0)
Hemoglobin: 11 g/dL — ABNORMAL LOW (ref 13.0–17.0)
MCH: 31.3 pg (ref 26.0–34.0)
MCHC: 32.3 g/dL (ref 30.0–36.0)
MCV: 96.9 fL (ref 80.0–100.0)
Platelets: 313 K/uL (ref 150–400)
RBC: 3.52 MIL/uL — ABNORMAL LOW (ref 4.22–5.81)
RDW: 14.7 % (ref 11.5–15.5)
WBC: 14.4 K/uL — ABNORMAL HIGH (ref 4.0–10.5)
nRBC: 0 % (ref 0.0–0.2)

## 2023-11-02 LAB — GLUCOSE, CAPILLARY
Glucose-Capillary: 108 mg/dL — ABNORMAL HIGH (ref 70–99)
Glucose-Capillary: 128 mg/dL — ABNORMAL HIGH (ref 70–99)
Glucose-Capillary: 176 mg/dL — ABNORMAL HIGH (ref 70–99)
Glucose-Capillary: 107 mg/dL — ABNORMAL HIGH (ref 70–99)

## 2023-11-02 LAB — BASIC METABOLIC PANEL WITH GFR
Anion gap: 16 — ABNORMAL HIGH (ref 5–15)
BUN: 37 mg/dL — ABNORMAL HIGH (ref 8–23)
CO2: 23 mmol/L (ref 22–32)
Calcium: 9.1 mg/dL (ref 8.9–10.3)
Chloride: 96 mmol/L — ABNORMAL LOW (ref 98–111)
Creatinine, Ser: 8.96 mg/dL — ABNORMAL HIGH (ref 0.61–1.24)
GFR, Estimated: 6 mL/min — ABNORMAL LOW (ref >60)
Glucose, Bld: 101 mg/dL — ABNORMAL HIGH (ref 70–99)
Potassium: 4.8 mmol/L (ref 3.5–5.1)
Sodium: 135 mmol/L (ref 135–145)

## 2023-11-02 LAB — VANCOMYCIN, RANDOM: Vancomycin Rm: 43 ug/mL

## 2023-11-02 MED ORDER — VANCOMYCIN VARIABLE DOSE PER UNSTABLE RENAL FUNCTION (PHARMACIST DOSING): Status: DC

## 2023-11-02 MED ORDER — DARBEPOETIN ALFA 60 MCG/0.3ML IJ SOSY: 60.0000 ug | PREFILLED_SYRINGE | INTRAMUSCULAR | Status: DC

## 2023-11-02 NOTE — Progress Notes (Signed)
 PROGRESS NOTE  Edgar Brewer  FMW:[id number] DOB: 1961/01/23 DOA: 10/19/2023 PCP: Katrinka Garnette KIDD, MD   Brief Narrative: Patient is a 63 male with history of ESRD on dialysis on Monday, Wednesday, Friday, HFpEF, diabetes type 2, prior CVA who was recently admitted for amputation of L 2nd toe through proximal phalanx  presented with complaint of purulent discharge in the left second toe, chills.  On presentation he was febrile, tachycardic, tachypneic, requiring 2 L of oxygen.  Labs with potassium 5.2, creatinine 5.7, lactic acidosis 5.6.XR L foot w/o acute findings or OM .  Patient was admitted for further management of sepsis secondary to left foot wound infection.  Started on broad spectrum antibiotics.  Podiatry, vascular surgery consulted.  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  PT/OT recommending SNF on discharge.  TTE showed 1.1 cm calcified mobile echodensity on the anterior mitral valve leaflet.  TEE confirmed endocarditis.  ID consulted.  Repeat blood culture sent on 10/8.  Waiting for final recommendation from ID regarding antibiotics before discharge to SNF.  TOC following  Assessment & Plan:  Principal Problem:   Sepsis (HCC) Active Problems:   Pyogenic inflammation of bone (HCC)   Diabetes mellitus type 2 with complications (HCC)   ESRD on dialysis (HCC)   Neuropathy   PAD (peripheral artery disease)   Gangrene of left foot (HCC)   Bradycardia   PAF (paroxysmal atrial fibrillation) (HCC)   Abnormal echocardiogram   Bacterial endocarditis  Sepsis secondary to surgical wound infection of the left foot: Recent history of amputation of L 2nd toe through proximal phalanx.  Presented with fever, chills, tachycardia, tachypnea, lactic acidosis.Started on broad spectrum antibiotics.  Podiatry, vascular surgery consulted.  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  Podiatry recommended nonweightbearing on postop shoe left lower extremity for 2  weeks. Culture showed mixed organisms. Completed the course of  Augmentin .   Bradycardia/secondary heart block/infective endocarditis: Became bradycardia in the afternoon of 10/4.  Did not require atropine or temporary pacemaker.  Heart rate spontaneously stabilized.  Blood pressure remained stable.  Cardiology consulted.  TSH normal.  EKG shows second-degree heart block, Mobitz type I/Wenckebach.  Heart rate remains stable now .   Avoid AV nodal blocking agents.Echo showed EF of 60 to 65%, normal right ventricular function but also showed  1.1 x 1.5 calcified mobile echodensity attached to the base and medial aspect (A1) of the anterior MV leaflet. TEE confirmed endocarditis.  ID consulted.  His previous blood cultures have been negative.Repeat blood culture sent on 10/8.  Waiting for final recommendation from ID regarding antibiotics before discharge to SNF.  Plan for 6 weeks of antibiotics. Became febrile and hypotensive in the evening of 10/8, found to have elevated white cell count ,now improving.  This morning he is afebrile, blood pressure stable.  On room air.   Type 2 diabetes: Continue current insulin  regimen.  Diabetic coordinator following  ESRD on dialysis/metabolic bone disease/hyperkalemia: Nephrology following for dialysis.  Continue Renvela , Sensipar .  Dialyzed on schedule.  Neuropathy: Gabapentin   Peripheral artery disease: Continue Crestor .  Follows-up with vascular surgery as an outpatient.  Obesity: BMI of 32  Constipation: Continue bowel regimen  Deconditioning/debility: PT recommended SNF on discharge.  Waiting for bed availability.  TOC following       DVT prophylaxis:heparin  injection 5,000 Units Start: 10/26/23 0915     Code Status: Full Code  Family Communication: Mother at bedside on 10/8  Patient status:Inpatient  Patient is from :  Home  Anticipated discharge to:SNF  Estimated DC date:after ID clearance   Consultants: Podiatry,vascular  surgery  Procedures:transmetatarsal amputation of left foot ,TEE  Antimicrobials:  Anti-infectives (From admission, onward)    Start     Dose/Rate Route Frequency Ordered Stop   11/03/23 1830  levofloxacin (LEVAQUIN) IVPB 500 mg       Placed in Followed by Linked Group   500 mg 100 mL/hr over 60 Minutes Intravenous Every 48 hours 11/01/23 1617     11/03/23 1200  vancomycin  (VANCOCIN ) IVPB 1000 mg/200 mL premix  Status:  Discontinued        1,000 mg 200 mL/hr over 60 Minutes Intravenous Every M-W-F (Hemodialysis) 11/01/23 1617 11/02/23 0731   11/02/23 0731  vancomycin  variable dose per unstable renal function (pharmacist dosing)         Does not apply See admin instructions 11/02/23 0731     11/01/23 1830  vancomycin  (VANCOREADY) IVPB 2000 mg/400 mL        2,000 mg 200 mL/hr over 120 Minutes Intravenous Once 11/01/23 1608 11/01/23 2222   11/01/23 1830  levofloxacin (LEVAQUIN) IVPB 750 mg       Placed in Followed by Linked Group   750 mg 100 mL/hr over 90 Minutes Intravenous  Once 11/01/23 1617 11/01/23 2130   10/27/23 2200  amoxicillin -clavulanate (AUGMENTIN ) 500-125 MG per tablet 1 tablet        1 tablet Oral Daily at bedtime 10/27/23 0849 10/31/23 2203   10/27/23 1000  amoxicillin -clavulanate (AUGMENTIN ) 500-125 MG per tablet 1 tablet  Status:  Discontinued        1 tablet Oral Every 12 hours 10/26/23 2155 10/27/23 0849   10/26/23 2245  amoxicillin -clavulanate (AUGMENTIN ) 500-125 MG per tablet 1 tablet  Status:  Discontinued        1 tablet Oral Every 12 hours 10/26/23 2154 10/26/23 2155   10/23/23 1800  vancomycin  (VANCOCIN ) IVPB 1000 mg/200 mL premix  Status:  Discontinued        1,000 mg 200 mL/hr over 60 Minutes Intravenous Every M-W-F (Hemodialysis) 10/23/23 1457 10/27/23 0846   10/20/23 1800  ceFEPIme  (MAXIPIME ) 1 g in sodium chloride  0.9 % 100 mL IVPB  Status:  Discontinued        1 g 200 mL/hr over 30 Minutes Intravenous Every 24 hours 10/19/23 1311 10/26/23 2154    10/20/23 1200  vancomycin  (VANCOCIN ) IVPB 1000 mg/200 mL premix  Status:  Discontinued        1,000 mg 200 mL/hr over 60 Minutes Intravenous Every M-W-F (Hemodialysis) 10/20/23 0725 10/23/23 1457   10/19/23 2200  metroNIDAZOLE  (FLAGYL ) tablet 500 mg  Status:  Discontinued        500 mg Oral Every 12 hours 10/19/23 1235 10/26/23 2154   10/19/23 1310  vancomycin  variable dose per unstable renal function (pharmacist dosing)  Status:  Discontinued         Does not apply See admin instructions 10/19/23 1311 10/20/23 0725   10/19/23 0845  ceFEPIme  (MAXIPIME ) 2 g in sodium chloride  0.9 % 100 mL IVPB        2 g 200 mL/hr over 30 Minutes Intravenous  Once 10/19/23 0839 10/19/23 1347   10/19/23 0845  metroNIDAZOLE  (FLAGYL ) IVPB 500 mg        500 mg 100 mL/hr over 60 Minutes Intravenous  Once 10/19/23 0839 10/19/23 1519   10/19/23 0845  vancomycin  (VANCOCIN ) IVPB 1000 mg/200 mL premix  Status:  Discontinued        1,000  mg 200 mL/hr over 60 Minutes Intravenous  Once 10/19/23 0839 10/19/23 0839   10/19/23 0845  vancomycin  (VANCOREADY) IVPB 2000 mg/400 mL        2,000 mg 200 mL/hr over 120 Minutes Intravenous STAT 10/19/23 0839 10/19/23 1207       Subjective: Patient seen and examined the bedside today.  Comfortably sitting at the edge of the bed.  Alert and oriented.  Denied any shortness of breath or cough.  No fever today.  No new complaints  Objective: Vitals:   11/02/23 0600 11/02/23 0700 11/02/23 0800 11/02/23 1053  BP: 106/69   99/60  Pulse:    89  Resp: 18   (!) 23  Temp:   98 F (36.7 C) 97.6 F (36.4 C)  TempSrc:  Oral Oral Oral  SpO2: 98%   99%  Weight: 92.3 kg     Height:        Intake/Output Summary (Last 24 hours) at 11/02/2023 1142 Last data filed at 11/02/2023 0400 Gross per 24 hour  Intake 240 ml  Output 1500 ml  Net -1260 ml   Filed Weights   11/01/23 0600 11/01/23 1430 11/02/23 0600  Weight: 94.3 kg 94.1 kg 92.3 kg    Examination:  General exam: Overall  comfortable, not in distress HEENT: PERRL Respiratory system:  no wheezes or crackles  Cardiovascular system: S1 & S2 heard, RRR.  Gastrointestinal system: Abdomen is nondistended, soft and nontender. Central nervous system: Alert and oriented Extremities: No edema, no clubbing ,no cyanosis, amputation of the left foot covered with dressing, left upper extremity AV fistula Skin: No rashes, no ulcers,no icterus     Data Reviewed: I have personally reviewed following labs and imaging studies  CBC: Recent Labs  Lab 10/28/23 1542 10/30/23 0900 11/01/23 0205 11/01/23 2002 11/02/23 0835  WBC 11.5* 11.9* 10.1 20.8* 14.4*  NEUTROABS 9.2*  --  8.0* 19.0*  --   HGB 10.6* 9.9* 9.7* 11.3* 11.0*  HCT 32.1* 30.6* 30.1* 35.7* 34.1*  MCV 94.7 95.6 95.9 97.0 96.9  PLT 279 271 281 332 313   Basic Metabolic Panel: Recent Labs  Lab 10/29/23 0310 10/30/23 0535 11/01/23 0205 11/01/23 2002 11/02/23 0220  NA 131* 132* 134* 134* 135  K 5.2* 5.6* 5.5* 4.5 4.8  CL 89* 91* 93* 90* 96*  CO2 25 24 24 26 23   GLUCOSE 118* 116* 224* 133* 101*  BUN 55* 71* 66* 33* 37*  CREATININE 11.05* 13.90* 12.73* 8.01* 8.96*  CALCIUM  9.4 9.4 9.2 9.7 9.1  MG  --  2.4  --   --   --   PHOS  --   --  7.9*  --   --      Recent Results (from the past 240 hours)  Aerobic/Anaerobic Culture w Gram Stain (surgical/deep wound)     Status: None (Preliminary result)   Collection Time: 10/23/23  4:40 PM   Specimen: Wound; Tissue  Result Value Ref Range Status   Specimen Description TISSUE LEFT FOOT  Final   Special Requests NONE  Final   Gram Stain NO WBC SEEN RARE GRAM NEGATIVE RODS   Final   Culture   Final    MODERATE ENTEROCOCCUS FAECALIS FEW STENOTROPHOMONAS MALTOPHILIA FEW STAPHYLOCOCCUS AUREUS NO ANAEROBES ISOLATED Sent to [facility] for further susceptibility testing. Performed at [facility], [address], [place], [place] [address]    Report Status PENDING  Incomplete   Organism ID, Bacteria  ENTEROCOCCUS FAECALIS  Final   Organism  ID, Bacteria STAPHYLOCOCCUS AUREUS  Final      Susceptibility   Enterococcus faecalis - MIC*    AMPICILLIN  <=2 SENSITIVE Sensitive     VANCOMYCIN  2 SENSITIVE Sensitive     GENTAMICIN SYNERGY SENSITIVE Sensitive     * MODERATE ENTEROCOCCUS FAECALIS   Staphylococcus aureus - MIC*    CIPROFLOXACIN <=0.5 SENSITIVE Sensitive     ERYTHROMYCIN <=0.25 SENSITIVE Sensitive     GENTAMICIN <=0.5 SENSITIVE Sensitive     OXACILLIN 0.5 SENSITIVE Sensitive     TETRACYCLINE <=1 SENSITIVE Sensitive     VANCOMYCIN  <=0.5 SENSITIVE Sensitive     TRIMETH/SULFA <=10 SENSITIVE Sensitive     CLINDAMYCIN  <=0.25 SENSITIVE Sensitive     RIFAMPIN <=0.5 SENSITIVE Sensitive     Inducible Clindamycin  NEGATIVE Sensitive     LINEZOLID  2 SENSITIVE Sensitive     * FEW STAPHYLOCOCCUS AUREUS  Susceptibility, Aer + Anaerob     Status: Abnormal   Collection Time: 10/23/23  4:40 PM  Result Value Ref Range Status   Suscept, Aer + Anaerob Final report (A)  Corrected    Comment: (NOTE) Performed At: [facility] [address] [place], [place] [id number] Jennette Shorter MD Ey:[id number] CORRECTED ON 10/06 AT 0835: PREVIOUSLY REPORTED AS Preliminary report    Source STENOTROPHOMONAS MALTOPHILIA/ TISSUE LEFT FOOT  Final    Comment: Performed at [facility], [name] [place], [place] [address]  Susceptibility Result     Status: Abnormal   Collection Time: 10/23/23  4:40 PM  Result Value Ref Range Status   Suscept Result 1 Comment (A)  Final    Comment: (NOTE) Stenotrophomonas maltophilia Identification performed by account, not confirmed by this laboratory. Levofloxacin and Trimethoprim-Sulfamethoxazole should not be used alone for antimicrobial therapy (CLSI).    Antimicrobial Suscept Comment  Corrected    Comment: (NOTE)      ** S = Susceptible; I = Intermediate; R = Resistant **                   P = Positive; N = Negative            MICS are expressed  in micrograms per mL   Antibiotic                 RSLT#1    RSLT#2    RSLT#3    RSLT#4 Levofloxacin                   S Minocycline                    S Trimethoprim/Sulfa             S Performed At: [facility] [address] [place], [place] [id number] Jennette Shorter MD Ey:[id number]   Culture, blood (Routine X 2) w Reflex to ID Panel     Status: None (Preliminary result)   Collection Time: 11/01/23  7:48 PM   Specimen: BLOOD  Result Value Ref Range Status   Specimen Description BLOOD SITE NOT SPECIFIED  Final   Special Requests   Final    BOTTLES DRAWN AEROBIC AND ANAEROBIC Blood Culture results may not be optimal due to an inadequate volume of blood received in culture bottles   Culture   Final    NO GROWTH < 12 HOURS Performed at [facility], [address], [place], [place] [address]    Report Status PENDING  Incomplete  Culture, blood (Routine X 2)  w Reflex to ID Panel     Status: None (Preliminary result)   Collection Time: 11/01/23  7:57 PM   Specimen: BLOOD  Result Value Ref Range Status   Specimen Description BLOOD SITE NOT SPECIFIED  Final   Special Requests   Final    BOTTLES DRAWN AEROBIC AND ANAEROBIC Blood Culture adequate volume   Culture   Final    NO GROWTH < 12 HOURS Performed at [facility], [address] [place], [place] [address]    Report Status PENDING  Incomplete     Radiology Studies: DG Chest Port 1 View Result Date: 11/01/2023 CLINICAL DATA:  Sepsis. EXAM: PORTABLE CHEST 1 VIEW COMPARISON:  October 19, 2023. FINDINGS: The heart size and mediastinal contours are within normal limits. Right lung is clear. Minimal left basilar subsegmental atelectasis is noted. The visualized skeletal structures are unremarkable. IMPRESSION: Minimal left basilar subsegmental atelectasis. Electronically Signed   By: Lynwood Landy Raddle M.D.   On: 11/01/2023 20:30   ECHO TEE Result Date: 11/01/2023    TRANSESOPHOGEAL ECHO REPORT   Patient Name:    Edgar Brewer Date of Exam: 11/01/2023 Medical Rec #:  [id number]    Height:       68.0 in Accession #:    [id number]   Weight:       207.9 lb Date of Birth:  July 30, 1960    BSA:          2.078 m Patient Age:    63 years     BP:           112/39 mmHg Patient Gender: M            HR:           68 bpm. Exam Location:  Inpatient Procedure: Transesophageal Echo, 3D Echo, Cardiac Doppler and Color Doppler            (Both Spectral and Color Flow Doppler were utilized during            procedure). Indications:     Endocarditis  History:         Patient has prior history of Echocardiogram examinations, most                  recent 10/31/2023. Stroke; Risk Factors:Diabetes and                  Hypertension.  Sonographer:     Jayson Gaskins Referring Phys:  [id number] MAUDE JAYSON EMMER Diagnosing Phys: Georganna Archer PROCEDURE: After discussion of the risks and benefits of a TEE, an informed consent was obtained from the patient. The transesophogeal probe was passed without difficulty through the esophogus of the patient. Sedation performed by different physician. The patient was monitored while under deep sedation. Anesthestetic sedation was provided intravenously by Anesthesiology: 250mg  of Propofol , 50mg  of Lidocaine . Image quality was excellent. The patient's vital signs; including heart rate, blood pressure, and oxygen saturation; remained stable throughout the procedure. The patient developed no complications during the procedure.  IMPRESSIONS  1. Left ventricular ejection fraction, by estimation, is 60 to 65%. The left ventricle has normal function. The left ventricle has no regional wall motion abnormalities.  2. Right ventricular systolic function is normal. The right ventricular size is normal.  3. No left atrial/left atrial appendage thrombus was detected.  4. There is a calcified mass on the anterior MV leaflet measuring 0.8 cm x 1.3 cm that moves in concert with the anterior MV leaflet.  This mass is favored to be calcific  thickening of the leaflet, but an infective component cannot be ruled out. Associated with this calcified mass is an independently mobile echodensity measuring 0.4 cm x 0.8 cm present on the LV side of the anterior MV leaflet. 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 The mitral valve is abnormal. Mild mitral valve regurgitation.  5. The aortic valve is calcified. Aortic valve regurgitation is not visualized. Mild aortic valve stenosis. Aortic valve mean gradient measures 18.0 mmHg. Aortic valve Vmax measures 2.10 m/s.  6. Evidence of atrial level shunting detected by color flow Doppler. There is a small patent foramen ovale.  7. 3D performed of the mitral valve. 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 I recommend consultation to ID for further assessment/management. FINDINGS  Left Ventricle: Left ventricular ejection fraction, by estimation, is 60 to 65%. The left ventricle has normal function. The left ventricle has no regional wall motion abnormalities. The left ventricular internal cavity size was normal in size. Right Ventricle: The right ventricular size is normal. No increase in right ventricular wall thickness. Right ventricular systolic function is normal. Left Atrium: Left atrial size was normal in size. No left atrial/left atrial appendage thrombus was detected. Right Atrium: Right atrial size was normal in size. Pericardium: Trivial pericardial effusion is present. The pericardial effusion is circumferential. Mitral Valve: There is a calcified mass on the anterior MV leaflet measuring 0.8 cm x 1.3 cm that moves in concert with the anterior MV leaflet. This mass is favored to be calcific thickening of the leaflet, but an infective component cannot be ruled out. Associated with this calcified mass is an independently mobile echodensity measuring 0.4 cm x 0.8 cm present on the  LV side of the anterior MV leaflet. 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 The mitral valve is abnormal. There is severe thickening of the anterior mitral valve leaflet(s). There is severe calcification of the anterior mitral valve leaflet(s). Normal mobility of the mitral valve leaflets. Mild mitral valve regurgitation. Tricuspid Valve: The tricuspid valve is grossly normal. Tricuspid valve regurgitation is trivial. Aortic Valve: The aortic valve is calcified. Aortic valve regurgitation is not visualized. Mild aortic stenosis is present. Aortic valve mean gradient measures 18.0 mmHg. Aortic valve peak gradient measures 17.6 mmHg. Pulmonic Valve: The pulmonic valve was grossly normal. Pulmonic valve regurgitation is trivial. Aorta: The aortic root, ascending aorta and aortic arch are all structurally normal, with no evidence of dilitation or obstruction. IAS/Shunts: The interatrial septum appears to be lipomatous. Evidence of atrial level shunting detected by color flow Doppler. A small patent foramen ovale is detected. Additional Comments: 3D was performed not requiring image post processing on an independent workstation and was abnormal. AORTIC VALVE AV Vmax:      210.00 cm/s AV Peak Grad: 17.6 mmHg AV Mean Grad: 18.0 mmHg Georganna Archer Electronically signed by Georganna Archer Signature Date/Time: 11/01/2023/11:33:50 AM    Final    EP STUDY Result Date: 11/01/2023 See surgical note for result.   Scheduled Meds:  (feeding supplement) PROSource Plus  30 mL Oral BID BM   aspirin  EC  81 mg Oral Daily   Chlorhexidine  Gluconate Cloth  6 each Topical Q0600   cinacalcet   60 mg Oral Q T,Th,Sat-1800   darbepoetin (ARANESP ) injection - DIALYSIS  60 mcg Subcutaneous Q Fri-1800   gabapentin   100 mg Oral TID   heparin  injection (subcutaneous)  5,000  Units Subcutaneous Q8H   insulin  aspart  0-6 Units Subcutaneous TID WC   insulin  aspart  4 Units Subcutaneous TID WC   insulin  glargine   50 Units Subcutaneous Daily   multivitamin  1 tablet Oral QHS   mupirocin  cream   Topical QODAY   pantoprazole   40 mg Oral Daily   polyethylene glycol  17 g Oral Daily   rosuvastatin   10 mg Oral Daily   senna-docusate  1 tablet Oral BID   sevelamer  carbonate  1,600 mg Oral TID WC   vancomycin  variable dose per unstable renal function (pharmacist dosing)   Does not apply See admin instructions   Continuous Infusions:  [START ON 11/03/2023] levofloxacin (LEVAQUIN) IV         LOS: 14 days   Ivonne Mustache, MD [facility] P10/09/2023, 11:42 AM

## 2023-11-02 NOTE — Progress Notes (Signed)
 EOS  Updated pt's family (darleen sister) on phone w/ pt's new location - family seems to think that abx were stopped, they were not, updated family that he was receiving them   Per ID notes from Dr. Zoanne pt will continue to need abx at this time   Will place the patient on IV Levaquin and IV vancomycin  in house. Patient probably can be discharged on IV vancomycin  and oral Levaquin at the time of discharge.   Family informs that pt is always like this after HD - but resolves in about 8 hours  BP continues to be low overnight, on call [facility] contacted, one time dose order of midodrine 5mg   Pt A&Ox4 but waxes/wanes overnight

## 2023-11-02 NOTE — Plan of Care (Signed)
  Problem: Education: Goal: Ability to describe self-care measures that may prevent or decrease complications (Diabetes Survival Skills Education) will improve Outcome: Progressing Goal: Individualized Educational Video(s) Outcome: Progressing   Problem: Coping: Goal: Ability to adjust to condition or change in health will improve Outcome: Progressing   Problem: Fluid Volume: Goal: Ability to maintain a balanced intake and output will improve Outcome: Progressing   Problem: Health Behavior/Discharge Planning: Goal: Ability to identify and utilize available resources and services will improve Outcome: Progressing Goal: Ability to manage health-related needs will improve Outcome: Progressing   Problem: Metabolic: Goal: Ability to maintain appropriate glucose levels will improve Outcome: Progressing   Problem: Nutritional: Goal: Maintenance of adequate nutrition will improve Outcome: Progressing Goal: Progress toward achieving an optimal weight will improve Outcome: Progressing   Problem: Skin Integrity: Goal: Risk for impaired skin integrity will decrease Outcome: Progressing   Problem: Tissue Perfusion: Goal: Adequacy of tissue perfusion will improve Outcome: Progressing   Problem: Education: Goal: Knowledge of General Education information will improve Description: Including pain rating scale, medication(s)/side effects and non-pharmacologic comfort measures Outcome: Progressing   Problem: Health Behavior/Discharge Planning: Goal: Ability to manage health-related needs will improve Outcome: Progressing   Problem: Clinical Measurements: Goal: Ability to maintain clinical measurements within normal limits will improve Outcome: Progressing Goal: Will remain free from infection Outcome: Progressing Goal: Diagnostic test results will improve Outcome: Progressing Goal: Respiratory complications will improve Outcome: Progressing Goal: Cardiovascular complication will  be avoided Outcome: Progressing   Problem: Activity: Goal: Risk for activity intolerance will decrease Outcome: Progressing   Problem: Nutrition: Goal: Adequate nutrition will be maintained Outcome: Progressing   Problem: Coping: Goal: Level of anxiety will decrease Outcome: Progressing   Problem: Elimination: Goal: Will not experience complications related to bowel motility Outcome: Progressing   Problem: Pain Managment: Goal: General experience of comfort will improve and/or be controlled Outcome: Progressing   Problem: Safety: Goal: Ability to remain free from injury will improve Outcome: Progressing   Problem: Skin Integrity: Goal: Risk for impaired skin integrity will decrease Outcome: Progressing

## 2023-11-02 NOTE — Progress Notes (Signed)
 [facility] Progress Note   Subjective:    Seen and examined.  Working with PT maneuvering wheelchair.  Had RR event last night after dialysis- desatted, spiked a fever.  Code sepsis activated.    Objective Vitals:   11/02/23 0600 11/02/23 0700 11/02/23 0800 11/02/23 1053  BP: 106/69   99/60  Pulse:    89  Resp: 18   (!) 23  Temp:   98 F (36.7 C) 97.6 F (36.4 C)  TempSrc:  Oral Oral Oral  SpO2: 98%   99%  Weight: 92.3 kg     Height:       Physical Exam General: alert, nad  Heart: RRR Resp: nml WOB on RA Abdomen: non distended Extremities: no LE edema, L foot dressed in protective shoe, + burnished skin Dialysis Access: LU AVF +bruit   Filed Weights   11/01/23 0600 11/01/23 1430 11/02/23 0600  Weight: 94.3 kg 94.1 kg 92.3 kg    Intake/Output Summary (Last 24 hours) at 11/02/2023 1104 Last data filed at 11/02/2023 0400 Gross per 24 hour  Intake 240 ml  Output 1500 ml  Net -1260 ml    Additional Objective Labs: Basic Metabolic Panel: Recent Labs  Lab 11/01/23 0205 11/01/23 2002 11/02/23 0220  NA 134* 134* 135  K 5.5* 4.5 4.8  CL 93* 90* 96*  CO2 24 26 23   GLUCOSE 224* 133* 101*  BUN 66* 33* 37*  CREATININE 12.73* 8.01* 8.96*  CALCIUM  9.2 9.7 9.1  PHOS 7.9*  --   --    Liver Function Tests: Recent Labs  Lab 10/28/23 1542 11/01/23 0205 11/01/23 2002  AST 16  --  21  ALT 15  --  16  ALKPHOS 215*  --  248*  BILITOT 0.6  --  1.1  PROT 6.9  --  8.3*  ALBUMIN 2.6* 2.5* 2.9*   No results for input(s): LIPASE, AMYLASE in the last 168 hours. CBC: Recent Labs  Lab 10/28/23 1542 10/30/23 0900 11/01/23 0205 11/01/23 2002 11/02/23 0835  WBC 11.5* 11.9* 10.1 20.8* 14.4*  NEUTROABS 9.2*  --  8.0* 19.0*  --   HGB 10.6* 9.9* 9.7* 11.3* 11.0*  HCT 32.1* 30.6* 30.1* 35.7* 34.1*  MCV 94.7 95.6 95.9 97.0 96.9  PLT 279 271 281 332 313   Blood Culture    Component Value Date/Time   SDES BLOOD SITE NOT SPECIFIED 11/01/2023 1957    SPECREQUEST  11/01/2023 1957    BOTTLES DRAWN AEROBIC AND ANAEROBIC Blood Culture adequate volume   CULT  11/01/2023 1957    NO GROWTH < 12 HOURS Performed at [facility], [address] [place], [place] [address]    REPTSTATUS PENDING 11/01/2023 1957    Cardiac Enzymes: No results for input(s): CKTOTAL, CKMB, CKMBINDEX, TROPONINI in the last 168 hours. CBG: Recent Labs  Lab 11/01/23 0743 11/01/23 1149 11/01/23 1850 11/01/23 1914 11/02/23 0636  GLUCAP 162* 134* 126* 120* 108*   Iron Studies: No results for input(s): IRON, TIBC, TRANSFERRIN, FERRITIN in the last 72 hours. Lab Results  Component Value Date   INR 1.1 11/01/2023   INR 1.1 10/19/2023   INR 1.0 11/14/2018   Studies/Results: DG Chest Port 1 View Result Date: 11/01/2023 CLINICAL DATA:  Sepsis. EXAM: PORTABLE CHEST 1 VIEW COMPARISON:  October 19, 2023. FINDINGS: The heart size and mediastinal contours are within normal limits. Right lung is clear. Minimal left basilar subsegmental atelectasis is noted. The visualized skeletal structures are unremarkable. IMPRESSION: Minimal left basilar  subsegmental atelectasis. Electronically Signed   By: Lynwood Landy Raddle M.D.   On: 11/01/2023 20:30   ECHO TEE Result Date: 11/01/2023    TRANSESOPHOGEAL ECHO REPORT   Patient Name:   Edgar Brewer Date of Exam: 11/01/2023 Medical Rec #:  [id number]    Height:       68.0 in Accession #:    [id number]   Weight:       207.9 lb Date of Birth:  1960-07-12    BSA:          2.078 m Patient Age:    63 years     BP:           112/39 mmHg Patient Gender: M            HR:           68 bpm. Exam Location:  Inpatient Procedure: Transesophageal Echo, 3D Echo, Cardiac Doppler and Color Doppler            (Both Spectral and Color Flow Doppler were utilized during            procedure). Indications:     Endocarditis  History:         Patient has prior history of Echocardiogram examinations, most                  recent 10/31/2023. Stroke;  Risk Factors:Diabetes and                  Hypertension.  Sonographer:     Jayson Gaskins Referring Phys:  [id number] MAUDE JAYSON EMMER Diagnosing Phys: Georganna Archer PROCEDURE: After discussion of the risks and benefits of a TEE, an informed consent was obtained from the patient. The transesophogeal probe was passed without difficulty through the esophogus of the patient. Sedation performed by different physician. The patient was monitored while under deep sedation. Anesthestetic sedation was provided intravenously by Anesthesiology: 250mg  of Propofol , 50mg  of Lidocaine . Image quality was excellent. The patient's vital signs; including heart rate, blood pressure, and oxygen saturation; remained stable throughout the procedure. The patient developed no complications during the procedure.  IMPRESSIONS  1. Left ventricular ejection fraction, by estimation, is 60 to 65%. The left ventricle has normal function. The left ventricle has no regional wall motion abnormalities.  2. Right ventricular systolic function is normal. The right ventricular size is normal.  3. No left atrial/left atrial appendage thrombus was detected.  4. There is a calcified mass on the anterior MV leaflet measuring 0.8 cm x 1.3 cm that moves in concert with the anterior MV leaflet. This mass is favored to be calcific thickening of the leaflet, but an infective component cannot be ruled out. Associated with this calcified mass is an independently mobile echodensity measuring 0.4 cm x 0.8 cm present on the LV side of the anterior MV leaflet. 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 The mitral valve is abnormal. Mild mitral valve regurgitation.  5. The aortic valve is calcified. Aortic valve regurgitation is not visualized. Mild aortic valve stenosis. Aortic valve mean gradient measures 18.0 mmHg. Aortic valve Vmax measures 2.10 m/s.  6. Evidence of atrial level shunting detected by color flow Doppler. There is a small patent foramen  ovale.  7. 3D performed of the mitral valve. 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 I recommend consultation to ID for further assessment/management. FINDINGS  Left Ventricle: Left ventricular ejection fraction, by estimation,  is 60 to 65%. The left ventricle has normal function. The left ventricle has no regional wall motion abnormalities. The left ventricular internal cavity size was normal in size. Right Ventricle: The right ventricular size is normal. No increase in right ventricular wall thickness. Right ventricular systolic function is normal. Left Atrium: Left atrial size was normal in size. No left atrial/left atrial appendage thrombus was detected. Right Atrium: Right atrial size was normal in size. Pericardium: Trivial pericardial effusion is present. The pericardial effusion is circumferential. Mitral Valve: There is a calcified mass on the anterior MV leaflet measuring 0.8 cm x 1.3 cm that moves in concert with the anterior MV leaflet. This mass is favored to be calcific thickening of the leaflet, but an infective component cannot be ruled out. Associated with this calcified mass is an independently mobile echodensity measuring 0.4 cm x 0.8 cm present on the LV side of the anterior MV leaflet. 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 The mitral valve is abnormal. There is severe thickening of the anterior mitral valve leaflet(s). There is severe calcification of the anterior mitral valve leaflet(s). Normal mobility of the mitral valve leaflets. Mild mitral valve regurgitation. Tricuspid Valve: The tricuspid valve is grossly normal. Tricuspid valve regurgitation is trivial. Aortic Valve: The aortic valve is calcified. Aortic valve regurgitation is not visualized. Mild aortic stenosis is present. Aortic valve mean gradient measures 18.0 mmHg. Aortic valve peak gradient  measures 17.6 mmHg. Pulmonic Valve: The pulmonic valve was grossly normal. Pulmonic valve regurgitation is trivial. Aorta: The aortic root, ascending aorta and aortic arch are all structurally normal, with no evidence of dilitation or obstruction. IAS/Shunts: The interatrial septum appears to be lipomatous. Evidence of atrial level shunting detected by color flow Doppler. A small patent foramen ovale is detected. Additional Comments: 3D was performed not requiring image post processing on an independent workstation and was abnormal. AORTIC VALVE AV Vmax:      210.00 cm/s AV Peak Grad: 17.6 mmHg AV Mean Grad: 18.0 mmHg Georganna Archer Electronically signed by Georganna Archer Signature Date/Time: 11/01/2023/11:33:50 AM    Final    EP STUDY Result Date: 11/01/2023 See surgical note for result.   Medications:  [START ON 11/03/2023] levofloxacin (LEVAQUIN) IV      (feeding supplement) PROSource Plus  30 mL Oral BID BM   aspirin  EC  81 mg Oral Daily   Chlorhexidine  Gluconate Cloth  6 each Topical Q0600   cinacalcet   60 mg Oral Q T,Th,Sat-1800   darbepoetin (ARANESP ) injection - DIALYSIS  60 mcg Subcutaneous Q Fri-1800   gabapentin   100 mg Oral TID   heparin  injection (subcutaneous)  5,000 Units Subcutaneous Q8H   insulin  aspart  0-6 Units Subcutaneous TID WC   insulin  aspart  4 Units Subcutaneous TID WC   insulin  glargine  50 Units Subcutaneous Daily   multivitamin  1 tablet Oral QHS   mupirocin  cream   Topical QODAY   pantoprazole   40 mg Oral Daily   polyethylene glycol  17 g Oral Daily   rosuvastatin   10 mg Oral Daily   senna-docusate  1 tablet Oral BID   sevelamer  carbonate  1,600 mg Oral TID WC   vancomycin  variable dose per unstable renal function (pharmacist dosing)   Does not apply See admin instructions    Dialysis Orders: MWF - NW 4hr, 400/A1.5, EDW 96.8kg, 2K/2.5Ca bath, AVF, Heparin  2600 - no ESA, Hgb > goal - Calcitriol 1.75 three times per  week  Assessment/Plan: Sepsis/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 9/29 tissue of left foot cx: Enterococcus faecalis (pan-sensitive), MSSA (pan-sensitive), Stenotrophomonas maltophilia (pan-sensitive). S/p TEE showing calcific mass on MV leaflet consistent with endocarditis.  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  Continue usual MWF HD - Next HD 11/03/23 Hypertension/volume. BP controlled, min LE edema, but low Na - UF as tolerated. Anemia. Hgb 10.6. No indication for ESA at this time.  Metabolic bone disease.  CorrCa high sided, VDRA on hold. Phos above goal. Continue home binders/cinacalcet . Nutrition: Alb low, continue supplements.  Renal diet w/fluid restrictions.  T2DM: Insulin  per primary. DVT prophylaxis: Prefer heparin  over Lovenox  in ESRD patient, AC on hold s/p surgery. Dispo: SNF recommended by PT Bradycardia - HR to 30s on 10/5. Cardiology consulted. Report Wenckeback conduction.  No additional work up indicated. Recommend K>4 and Mg>2, avoid AV nodal blockers.   Edgar Bonine MD [facility] 11/02/2023,11:04 AM  LOS: 14 days

## 2023-11-02 NOTE — Progress Notes (Signed)
 Physical Therapy Treatment Patient Details Name: Edgar Brewer MRN: [id number] DOB: 07/12/1960 Today's Date: 11/02/2023   History of Present Illness 63 y.o. M adm 10/19/23 with sepsis. 9/29 Lt transmet amp. 10/8 TEE with endocarditis and respiratory distress. PMHx: ESRD on HD MWF, HFpEF, T2DM, CVA, PAD, PAF, HTN    PT Comments  Pt sitting EOB on arrival with post op shoe donned. Per surgeon pt to remain NWB for 2-3 weeks post sx. Pt currently unable to maintain LLE NWB with transfers and declined attempting to hop with RW present when entering bathroom. Pt with delay and struggle with WC mobility with rapid fatigue when attempting to propel chair. Pt initially with RLE leg rest left off but pt stated it was too difficult to attempt RLE and UB propulsion with leg rest returned. Pt with decreased safety awareness and RN aware of mobility. Patient will benefit from continued inpatient follow up therapy, <3 hours/day until cleared for WBAT.     If plan is discharge home, recommend the following: A lot of help with walking and/or transfers;Help with stairs or ramp for entrance;A lot of help with bathing/dressing/bathroom;Assist for transportation;Assistance with cooking/housework   Can travel by private vehicle     No  Equipment Recommendations  Wheelchair (measurements PT);Wheelchair cushion (measurements PT);Rolling walker (2 wheels)    Recommendations for Other Services       Precautions / Restrictions Precautions Precautions: Fall Recall of Precautions/Restrictions: Impaired Required Braces or Orthoses: Other Brace Other Brace: postop shoe Restrictions LLE Weight Bearing Per Provider Order: Non weight bearing     Mobility  Bed Mobility               General bed mobility comments: pt sitting EOB on arrival    Transfers Overall transfer level: Needs assistance   Transfers: Sit to/from Stand, Bed to chair/wheelchair/BSC Sit to Stand: Contact guard assist Stand pivot  transfers: Contact guard assist         General transfer comment: pt performed stand pivot from bed to [facility] with pt standing on LLE despite cues and direction for sequence. Pt needing to have BM and adamantly refused BSC with WC pulled to bathroom door and pt stepping 2' with RW into bathroom again refusing to adhere to NWB status    Ambulation/Gait                   Psychologist, counselling mobility: Yes Wheelchair propulsion: Both upper extremities Wheelchair parts: Needs assistance Distance: 80 Wheelchair Assistance Details (indicate cue type and reason): max assist to manage foot rests, min assist to manage brakes, min assist for steering with max verbal and gestural cues for sequence. increased time with short push arc despite education and 4 rests to propel 67' with assist to return to room   Tilt Bed    Modified Rankin (Stroke Patients Only)       Balance Overall balance assessment: Needs assistance Sitting-balance support: No upper extremity supported, Feet supported Sitting balance-Leahy Scale: Fair Sitting balance - Comments: EOB without support   Standing balance support: Bilateral upper extremity supported, Reliant on assistive device for balance Standing balance-Leahy Scale: Poor Standing balance comment: Rw in standing                            Communication Communication Communication: No apparent difficulties  Cognition Arousal: Alert Behavior During Therapy:  Flat affect   PT - Cognitive impairments: Problem solving, Sequencing, Awareness, Safety/Judgement, Memory                       PT - Cognition Comments: pt unable to recall moving rooms, short tempered at times, decreased awareness of safety and limitations, pt ignoring cues for NWB LLE Following commands: Impaired Following commands impaired: Follows one step commands inconsistently, Follows one step commands with  increased time    Cueing Cueing Techniques: Verbal cues, Gestural cues  Exercises      General Comments        Pertinent Vitals/Pain Pain Assessment Pain Assessment: No/denies pain    Home Living                          Prior Function            PT Goals (current goals can now be found in the care plan section) Progress towards PT goals: Progressing toward goals    Frequency    Min 2X/week      PT Plan      Co-evaluation              AM-PAC PT 6 Clicks Mobility   Outcome Measure  Help needed turning from your back to your side while in a flat bed without using bedrails?: A Little Help needed moving from lying on your back to sitting on the side of a flat bed without using bedrails?: A Little Help needed moving to and from a bed to a chair (including a wheelchair)?: A Little Help needed standing up from a chair using your arms (e.g., wheelchair or bedside chair)?: A Little Help needed to walk in hospital room?: Total Help needed climbing 3-5 steps with a railing? : Total 6 Click Score: 14    End of Session Equipment Utilized During Treatment: Gait belt Activity Tolerance: Patient tolerated treatment well Patient left: Other (comment);with call bell/phone within reach (on [facility] over toilet with recliner nearby, call button in hand and RN aware) Nurse Communication: Mobility status;Precautions;Weight bearing status PT Visit Diagnosis: Unsteadiness on feet (R26.81);Other abnormalities of gait and mobility (R26.89);Muscle weakness (generalized) (M62.81)     Time: 9055-8983 PT Time Calculation (min) (ACUTE ONLY): 32 min  Charges:    $Therapeutic Activity: 8-22 mins $Wheel Chair Management: 8-22 mins PT General Charges $$ ACUTE PT VISIT: 1 Visit                     Lenoard SQUIBB, PT Acute Rehabilitation Services Office: [phone]    Lenoard NOVAK Gladis Soley 11/02/2023, 11:29 AM

## 2023-11-02 NOTE — Plan of Care (Signed)

## 2023-11-02 NOTE — TOC Progression Note (Signed)
 Transition of Care [facility]) - Progression Note    Patient Details  Name: Edgar Brewer MRN: [id number] Date of Birth: 09-23-1960  [facility]) CM/SW Contact  Luise JAYSON Pan, [place] Phone Number: 11/02/2023, 11:21 AM  Clinical Narrative:   Per Madelin with [employer], authorization has been approved.   Per progression meeting, patient awaiting PICC placement for IV abx. Per ID note, patient will need 6 weeks IV abx. Awaiting final ID recs.   CSW will continue to follow.    Expected Discharge Plan and Services         Expected Discharge Date: 10/31/23                                     Social Drivers of Health (SDOH) Interventions SDOH Screenings   Food Insecurity: No Food Insecurity (10/20/2023)  Housing: Low Risk  (10/20/2023)  Transportation Needs: No Transportation Needs (10/20/2023)  Utilities: Not At Risk (10/20/2023)  Depression (PHQ2-9): Low Risk  (09/19/2023)  Financial Resource Strain: Low Risk  (04/19/2022)  Physical Activity: Inactive (04/19/2022)  Social Connections: Unknown (04/19/2022)  Stress: No Stress Concern Present (04/19/2022)  Tobacco Use: Medium Risk (10/23/2023)    Readmission Risk Interventions    10/20/2023    4:09 PM  Readmission Risk Prevention Plan  Transportation Screening Complete  PCP or Specialist Appt within 3-5 Days Complete  HRI or Home Care Consult Complete  Social Work Consult for Recovery Care Planning/Counseling Complete  Palliative Care Screening Not Applicable

## 2023-11-02 NOTE — Progress Notes (Signed)
 Pharmacy Antibiotic Note  Edgar Brewer is a 63 y.o. male admitted on 10/19/2023 with a chief complaint of sepsis, worsening left foot pain, cough, hypoxia, and fatigue.  Pharmacy has been consulted for vancomycin  and levofloxacin dosing in the setting of ESRD on dialysis (usually MWF schedule).   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  The patient was on Vancomycin  earlier this admission - last level 18 mcg/ml on 9/30 and last dose given 10/1 after the patient's HD session. Since that time the patient has received no additional Vancomycin  doses and 2 HD sessions. Estimations for residual levels were low </= 10 mcg/ml so the patient was re-loaded.  A vancomycin  random level this morning resulted higher than expected at 43 mcg/ml - which could be explained by lower than anticipated removal with hemodialysis. Will plan to hold the patient's next level with HD and recheck a level with 10/11 AM labs.   Plan: - No standing Vancomycin  for now - Will plan to check a level after the patient's next HD - planned for 10/11 - so with 10/12 AM labs - Continue Levaquin 500 mg IV every 48 hours - Will continue to follow HD schedule/duration, culture results, LOT, and antibiotic de-escalation plans   Height: 5' 8 (172.7 cm) Weight: 92.3 kg (203 lb 8 oz) IBW/kg (Calculated) : 68.4  Temp (24hrs), Avg:99.1 F (37.3 C), Min:97.5 F (36.4 C), Max:100.6 F (38.1 C)  Recent Labs  Lab 10/28/23 0751 10/28/23 1542 10/29/23 0310 10/30/23 0535 10/30/23 0900 11/01/23 0205 11/01/23 2002 11/01/23 2203 11/02/23 0220  WBC 12.9* 11.5*  --   --  11.9* 10.1 20.8*  --   --   CREATININE 8.70* 2.57* 11.05* 13.90*  --  12.73* 8.01*  --  8.96*  LATICACIDVEN  --  0.9  --   --   --   --  1.5 1.0  --   VANCORANDOM  --   --   --   --   --   --   --   --  43    Estimated Creatinine Clearance: 9.2 mL/min (A) (by C-G formula based on  SCr of 8.96 mg/dL (H)).    Allergies  Allergen Reactions   Codeine Anaphylaxis, Hives, Swelling and Other (See Comments)    Swelling all over body and the throat   Clindamycin /Lincomycin Nausea And Vomiting    Antimicrobials this admission: 9/25 cefepime  >> 10/2 9/25 metronidazole  >> 10/2 9/25 vancomycin  >> 10/3  10/3 amoxicillin /clavulanate >> 10/7 10/8 vancomycin  >>  10/8 levofloxacin >>  Microbiology results: 9/25 BCx: NGTD x5 days 9/29 tissue of left foot cx: Enterococcus faecalis (pan-sensitive), MSSA (pan-sensitive), Stenotrophomonas maltophilia (pan-sensitive)  Thank you for allowing pharmacy to be a part of this patient's care.  Almarie Lunger, PharmD, BCPS, BCIDP Infectious Diseases Clinical Pharmacist 11/02/2023 7:33 AM   **Pharmacist phone directory can now be found on [email or web address] ([email or web address]).  Listed under [facility].

## 2023-11-02 NOTE — Progress Notes (Signed)
 Subjective:  No complaints. Usually a bit slow to wake in am   Objective:  Vitals:   11/02/23 0410 11/02/23 0600 11/02/23 0700 11/02/23 0800  BP:  106/69    Pulse:      Resp: 16 18    Temp: 99 F (37.2 C)   98 F (36.7 C)  TempSrc: Oral  Oral Oral  SpO2:  98%    Weight:  92.3 kg    Height:        Intake/Output from previous day:  Intake/Output Summary (Last 24 hours) at 11/02/2023 0904 Last data filed at 11/02/2023 0400 Gross per 24 hour  Intake 340 ml  Output 1500 ml  Net -1160 ml    Physical Exam:  Chronically ill male no distress Lungs clear AS murmur Abdomen benign No edema Post amputation left forefoot Left brachiocephalic fistula   Lab Results: Basic Metabolic Panel: Recent Labs    11/01/23 0205 11/01/23 2002 11/02/23 0220  NA 134* 134* 135  K 5.5* 4.5 4.8  CL 93* 90* 96*  CO2 24 26 23   GLUCOSE 224* 133* 101*  BUN 66* 33* 37*  CREATININE 12.73* 8.01* 8.96*  CALCIUM  9.2 9.7 9.1  PHOS 7.9*  --   --    Liver Function Tests: Recent Labs    11/01/23 0205 11/01/23 2002  AST  --  21  ALT  --  16  ALKPHOS  --  248*  BILITOT  --  1.1  PROT  --  8.3*  ALBUMIN 2.5* 2.9*   No results for input(s): LIPASE, AMYLASE in the last 72 hours. CBC: Recent Labs    11/01/23 0205 11/01/23 2002  WBC 10.1 20.8*  NEUTROABS 8.0* 19.0*  HGB 9.7* 11.3*  HCT 30.1* 35.7*  MCV 95.9 97.0  PLT 281 332      Imaging: DG Chest Port 1 View Result Date: 11/01/2023 CLINICAL DATA:  Sepsis. EXAM: PORTABLE CHEST 1 VIEW COMPARISON:  October 19, 2023. FINDINGS: The heart size and mediastinal contours are within normal limits. Right lung is clear. Minimal left basilar subsegmental atelectasis is noted. The visualized skeletal structures are unremarkable. IMPRESSION: Minimal left basilar subsegmental atelectasis. Electronically Signed   By: Lynwood Landy Raddle M.D.   On: 11/01/2023 20:30   ECHO TEE Result Date: 11/01/2023    TRANSESOPHOGEAL ECHO REPORT   Patient  Name:   Edgar Brewer Date of Exam: 11/01/2023 Medical Rec #:  [id number]    Height:       68.0 in Accession #:    [id number]   Weight:       207.9 lb Date of Birth:  1960/04/08    BSA:          2.078 m Patient Age:    63 years     BP:           112/39 mmHg Patient Gender: M            HR:           68 bpm. Exam Location:  Inpatient Procedure: Transesophageal Echo, 3D Echo, Cardiac Doppler and Color Doppler            (Both Spectral and Color Flow Doppler were utilized during            procedure). Indications:     Endocarditis  History:         Patient has prior history of Echocardiogram examinations, most  recent 10/31/2023. Stroke; Risk Factors:Diabetes and                  Hypertension.  Sonographer:     Jayson Gaskins Referring Phys:  [id number] MAUDE JAYSON EMMER Diagnosing Phys: Georganna Archer PROCEDURE: After discussion of the risks and benefits of a TEE, an informed consent was obtained from the patient. The transesophogeal probe was passed without difficulty through the esophogus of the patient. Sedation performed by different physician. The patient was monitored while under deep sedation. Anesthestetic sedation was provided intravenously by Anesthesiology: 250mg  of Propofol , 50mg  of Lidocaine . Image quality was excellent. The patient's vital signs; including heart rate, blood pressure, and oxygen saturation; remained stable throughout the procedure. The patient developed no complications during the procedure.  IMPRESSIONS  1. Left ventricular ejection fraction, by estimation, is 60 to 65%. The left ventricle has normal function. The left ventricle has no regional wall motion abnormalities.  2. Right ventricular systolic function is normal. The right ventricular size is normal.  3. No left atrial/left atrial appendage thrombus was detected.  4. There is a calcified mass on the anterior MV leaflet measuring 0.8 cm x 1.3 cm that moves in concert with the anterior MV leaflet. This mass is favored to be  calcific thickening of the leaflet, but an infective component cannot be ruled out. Associated with this calcified mass is an independently mobile echodensity measuring 0.4 cm x 0.8 cm present on the LV side of the anterior MV leaflet. 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 The mitral valve is abnormal. Mild mitral valve regurgitation.  5. The aortic valve is calcified. Aortic valve regurgitation is not visualized. Mild aortic valve stenosis. Aortic valve mean gradient measures 18.0 mmHg. Aortic valve Vmax measures 2.10 m/s.  6. Evidence of atrial level shunting detected by color flow Doppler. There is a small patent foramen ovale.  7. 3D performed of the mitral valve. 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 I recommend consultation to ID for further assessment/management. FINDINGS  Left Ventricle: Left ventricular ejection fraction, by estimation, is 60 to 65%. The left ventricle has normal function. The left ventricle has no regional wall motion abnormalities. The left ventricular internal cavity size was normal in size. Right Ventricle: The right ventricular size is normal. No increase in right ventricular wall thickness. Right ventricular systolic function is normal. Left Atrium: Left atrial size was normal in size. No left atrial/left atrial appendage thrombus was detected. Right Atrium: Right atrial size was normal in size. Pericardium: Trivial pericardial effusion is present. The pericardial effusion is circumferential. Mitral Valve: There is a calcified mass on the anterior MV leaflet measuring 0.8 cm x 1.3 cm that moves in concert with the anterior MV leaflet. This mass is favored to be calcific thickening of the leaflet, but an infective component cannot be ruled out. Associated with this calcified mass is an independently mobile echodensity measuring 0.4 cm x 0.8 cm  present on the LV side of the anterior MV leaflet. 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 The mitral valve is abnormal. There is severe thickening of the anterior mitral valve leaflet(s). There is severe calcification of the anterior mitral valve leaflet(s). Normal mobility of the mitral valve leaflets. Mild mitral valve regurgitation. Tricuspid Valve: The tricuspid valve is grossly normal. Tricuspid valve regurgitation is trivial. Aortic Valve: The aortic valve is calcified. Aortic valve regurgitation is not visualized. Mild aortic stenosis is  present. Aortic valve mean gradient measures 18.0 mmHg. Aortic valve peak gradient measures 17.6 mmHg. Pulmonic Valve: The pulmonic valve was grossly normal. Pulmonic valve regurgitation is trivial. Aorta: The aortic root, ascending aorta and aortic arch are all structurally normal, with no evidence of dilitation or obstruction. IAS/Shunts: The interatrial septum appears to be lipomatous. Evidence of atrial level shunting detected by color flow Doppler. A small patent foramen ovale is detected. Additional Comments: 3D was performed not requiring image post processing on an independent workstation and was abnormal. AORTIC VALVE AV Vmax:      210.00 cm/s AV Peak Grad: 17.6 mmHg AV Mean Grad: 18.0 mmHg Georganna Archer Electronically signed by Georganna Archer Signature Date/Time: 11/01/2023/11:33:50 AM    Final    EP STUDY Result Date: 11/01/2023 See surgical note for result.   Cardiac Studies:  ECG: SR rate 69 wenkebach   Telemetry: SR rates 80-90  Echo: Reviewed 10/7 EF 60-65% 1.1 x 1.5 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  mild/mod AS  TEE 10/8 confirms vegetation on anterior leaflet   Medications:    (feeding supplement) PROSource Plus  30 mL Oral BID BM   aspirin  EC  81 mg Oral Daily   Chlorhexidine  Gluconate Cloth  6 each Topical Q0600   cinacalcet   60 mg Oral Q T,Th,Sat-1800   darbepoetin (ARANESP )  injection - DIALYSIS  60 mcg Subcutaneous Q Fri-1800   gabapentin   100 mg Oral TID   heparin  injection (subcutaneous)  5,000 Units Subcutaneous Q8H   insulin  aspart  0-6 Units Subcutaneous TID WC   insulin  aspart  4 Units Subcutaneous TID WC   insulin  glargine  50 Units Subcutaneous Daily   multivitamin  1 tablet Oral QHS   mupirocin  cream   Topical QODAY   pantoprazole   40 mg Oral Daily   polyethylene glycol  17 g Oral Daily   rosuvastatin   10 mg Oral Daily   senna-docusate  1 tablet Oral BID   sevelamer  carbonate  1,600 mg Oral TID WC   vancomycin  variable dose per unstable renal function (pharmacist dosing)   Does not apply See admin instructions      [START ON 11/03/2023] levofloxacin (LEVAQUIN) IV      Assessment/Plan:   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 Have been negative source likely foot. CRP and ESR high as expected. Not an operative candidate The bulk of the lesion is calcified ? MAC but there is a smaller mobile vegetation attached Lesion not in area that should affect conduction but given baseline conduction dx will have to watch for more advanced AV block would not be candidate for PPM until 6 weeks antibiotics given Repeat ECG today Bradycardia:  seen by Dr Cindie earlier this admission for such. Wenkebach rhtym with no long pauses. 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

## 2023-11-02 NOTE — Anesthesia Postprocedure Evaluation (Signed)
 Anesthesia Post Note  Patient: Edgar Brewer  Procedure(s) Performed: TRANSESOPHAGEAL ECHOCARDIOGRAM     Patient location during evaluation: PACU Anesthesia Type: MAC Level of consciousness: awake and alert Pain management: pain level controlled Vital Signs Assessment: post-procedure vital signs reviewed and stable Respiratory status: spontaneous breathing, nonlabored ventilation and respiratory function stable Cardiovascular status: stable and blood pressure returned to baseline Anesthetic complications: no   No notable events documented.  Last Vitals:  Vitals:   11/02/23 0600 11/02/23 0800  BP: 106/69   Pulse:    Resp: 18   Temp:  36.7 C  SpO2: 98%     Last Pain:  Vitals:   11/02/23 0800  TempSrc: Oral  PainSc:                  Debby FORBES Like

## 2023-11-03 DIAGNOSIS — Z89432 Acquired absence of left foot: Secondary | ICD-10-CM | POA: Diagnosis not present

## 2023-11-03 DIAGNOSIS — T8142XD Infection following a procedure, deep incisional surgical site, subsequent encounter: Secondary | ICD-10-CM | POA: Diagnosis not present

## 2023-11-03 DIAGNOSIS — I059 Rheumatic mitral valve disease, unspecified: Secondary | ICD-10-CM

## 2023-11-03 DIAGNOSIS — B9689 Other specified bacterial agents as the cause of diseases classified elsewhere: Secondary | ICD-10-CM | POA: Diagnosis not present

## 2023-11-03 DIAGNOSIS — I330 Acute and subacute infective endocarditis: Secondary | ICD-10-CM | POA: Diagnosis not present

## 2023-11-03 LAB — GLUCOSE, CAPILLARY
Glucose-Capillary: 151 mg/dL — ABNORMAL HIGH (ref 70–99)
Glucose-Capillary: 100 mg/dL — ABNORMAL HIGH (ref 70–99)
Glucose-Capillary: 165 mg/dL — ABNORMAL HIGH (ref 70–99)
Glucose-Capillary: 172 mg/dL — ABNORMAL HIGH (ref 70–99)

## 2023-11-03 LAB — CBC
HCT: 30.6 % — ABNORMAL LOW (ref 39.0–52.0)
Hemoglobin: 10 g/dL — ABNORMAL LOW (ref 13.0–17.0)
MCH: 31.3 pg (ref 26.0–34.0)
MCHC: 32.7 g/dL (ref 30.0–36.0)
MCV: 95.6 fL (ref 80.0–100.0)
Platelets: 281 K/uL (ref 150–400)
RBC: 3.2 MIL/uL — ABNORMAL LOW (ref 4.22–5.81)
RDW: 14.6 % (ref 11.5–15.5)
WBC: 10 K/uL (ref 4.0–10.5)
nRBC: 0 % (ref 0.0–0.2)

## 2023-11-03 LAB — BASIC METABOLIC PANEL WITH GFR
Anion gap: 19 — ABNORMAL HIGH (ref 5–15)
BUN: 57 mg/dL — ABNORMAL HIGH (ref 8–23)
CO2: 24 mmol/L (ref 22–32)
Calcium: 9.4 mg/dL (ref 8.9–10.3)
Chloride: 92 mmol/L — ABNORMAL LOW (ref 98–111)
Creatinine, Ser: 11.4 mg/dL — ABNORMAL HIGH (ref 0.61–1.24)
GFR, Estimated: 5 mL/min — ABNORMAL LOW (ref >60)
Glucose, Bld: 126 mg/dL — ABNORMAL HIGH (ref 70–99)
Potassium: 4.9 mmol/L (ref 3.5–5.1)
Sodium: 135 mmol/L (ref 135–145)

## 2023-11-03 MED ORDER — DIPHENHYDRAMINE HCL 25 MG PO CAPS
25.0000 mg | ORAL_CAPSULE | Freq: Once | ORAL | Status: AC
  Administered 2023-11-03: 25 mg via ORAL

## 2023-11-03 MED ORDER — DIPHENHYDRAMINE HCL 25 MG PO CAPS
ORAL_CAPSULE | ORAL | Status: AC
  Filled 2023-11-03: qty 1

## 2023-11-03 MED ORDER — HEPARIN SODIUM (PORCINE) 1000 UNIT/ML IJ SOLN
2600.0000 [IU] | Freq: Once | INTRAMUSCULAR | Status: AC
  Administered 2023-11-03: 2600 [IU] via INTRAVENOUS

## 2023-11-03 NOTE — Plan of Care (Signed)
  Problem: Coping: Goal: Ability to adjust to condition or change in health will improve Outcome: Progressing   Problem: Fluid Volume: Goal: Ability to maintain a balanced intake and output will improve Outcome: Progressing   Problem: Health Behavior/Discharge Planning: Goal: Ability to manage health-related needs will improve Outcome: Progressing   Problem: Metabolic: Goal: Ability to maintain appropriate glucose levels will improve Outcome: Progressing   Problem: Nutritional: Goal: Maintenance of adequate nutrition will improve Outcome: Progressing Goal: Progress toward achieving an optimal weight will improve Outcome: Progressing   Problem: Skin Integrity: Goal: Risk for impaired skin integrity will decrease Outcome: Progressing   Problem: Tissue Perfusion: Goal: Adequacy of tissue perfusion will improve Outcome: Progressing   Problem: Education: Goal: Knowledge of General Education information will improve Description: Including pain rating scale, medication(s)/side effects and non-pharmacologic comfort measures Outcome: Progressing   Problem: Clinical Measurements: Goal: Ability to maintain clinical measurements within normal limits will improve Outcome: Progressing Goal: Will remain free from infection Outcome: Progressing Goal: Diagnostic test results will improve Outcome: Progressing Goal: Respiratory complications will improve Outcome: Progressing Goal: Cardiovascular complication will be avoided Outcome: Progressing   Problem: Activity: Goal: Risk for activity intolerance will decrease Outcome: Progressing   Problem: Nutrition: Goal: Adequate nutrition will be maintained Outcome: Progressing   Problem: Coping: Goal: Level of anxiety will decrease Outcome: Progressing   Problem: Elimination: Goal: Will not experience complications related to bowel motility Outcome: Progressing   Problem: Pain Managment: Goal: General experience of comfort will  improve and/or be controlled Outcome: Progressing   Problem: Safety: Goal: Ability to remain free from injury will improve Outcome: Progressing   Problem: Skin Integrity: Goal: Risk for impaired skin integrity will decrease Outcome: Progressing

## 2023-11-03 NOTE — Progress Notes (Signed)
 PROGRESS NOTE  Edgar Brewer  FMW:[id number] DOB: [birth year] DOA: 10/19/2023 PCP: Katrinka Garnette KIDD, MD   Brief Narrative: As per prior documentation patient is a 60 male with history of ESRD on dialysis on Monday, Wednesday, Friday, HFpEF, diabetes type 2, prior CVA who was recently admitted for amputation of L 2nd toe through proximal phalanx  presented with complaint of purulent discharge in the left second toe, chills.  On presentation he was febrile, tachycardic, tachypneic, requiring 2 L of oxygen.  Labs with potassium 5.2, creatinine 5.7, lactic acidosis 5.6.XR L foot w/o acute findings or OM .  Patient was admitted for further management of sepsis secondary to left foot wound infection.  Started on broad spectrum antibiotics.  Podiatry, vascular surgery consulted.  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  PT/OT recommending SNF on discharge.  TTE showed 1.1 cm calcified mobile echodensity on the anterior mitral valve leaflet.  TEE confirmed endocarditis.  ID consulted.  Repeat blood culture sent on 10/8.  Waiting for final recommendation from ID regarding antibiotics before discharge to SNF.  TOC following  11/03/2023: Patient seen on hemodialysis.  No new complaints.  Patient is on IV Levaquin and vancomycin .  Wound cultures grew stenotrophomonas maltophilia.  There is also documentation of growth of Enterococcus faecalis and MSSA.  Infectious disease consult is appreciated.  Infectious disease is directing antibiotics management.  As previously documented, TEE revealed endocarditis.  Assessment & Plan:  Principal Problem:   Sepsis (HCC) Active Problems:   Diabetes mellitus type 2 with complications (HCC)   ESRD on dialysis (HCC)   Gangrene of left foot (HCC)   Pyogenic inflammation of bone (HCC)   Neuropathy   PAD (peripheral artery disease)   Bradycardia   PAF (paroxysmal atrial fibrillation) (HCC)   Abnormal echocardiogram   Bacterial endocarditis    Endocarditis of mitral valve  Sepsis secondary to surgical wound infection of the left foot:  -Recent history of amputation of L 2nd toe through proximal phalanx.  Presented with fever, chills, tachycardia, tachypnea, lactic acidosis.Started on broad spectrum antibiotics.  Podiatry, vascular surgery consulted.  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  Podiatry recommended nonweightbearing on postop shoe left lower extremity for 2 weeks. Culture showed mixed organisms. Completed the course of  Augmentin .  11/03/2023: Infectious diseases directing antibiotics management.  Patient is currently on IV vancomycin  and Levaquin.  Bradycardia/secondary heart block/infective endocarditis: Became bradycardia in the afternoon of 10/4.  Did not require atropine or temporary pacemaker.  Heart rate spontaneously stabilized.  Blood pressure remained stable.  Cardiology consulted.  TSH normal.  EKG shows second-degree heart block, Mobitz type I/Wenckebach.  Heart rate remains stable now .   Avoid AV nodal blocking agents.Echo showed EF of 60 to 65%, normal right ventricular function but also showed  1.1 x 1.5 calcified mobile echodensity attached to the base and medial aspect (A1) of the anterior MV leaflet. TEE confirmed endocarditis.  ID consulted.  His previous blood cultures have been negative.Repeat blood culture sent on 10/8.  Waiting for final recommendation from ID regarding antibiotics before discharge to SNF.  Plan for 6 weeks of antibiotics. Became febrile and hypotensive in the evening of 10/8, found to have elevated white cell count ,now improving.  This morning he is afebrile, blood pressure stable.  On room air.   Type 2 diabetes:  -Continue current insulin  regimen.  Diabetic coordinator following  ESRD on dialysis/metabolic bone disease/hyperkalemia:  -Nephrology following for dialysis.  Continue Renvela ,  Sensipar .  Dialyzed on schedule. 11/03/2023: Seen on hemodialysis  today.  Neuropathy: Gabapentin   Peripheral artery disease: Continue Crestor .  Follows-up with vascular surgery as an outpatient.  Obesity: BMI of 32  Constipation: Continue bowel regimen  Deconditioning/debility: PT recommended SNF on discharge.  Waiting for bed availability.  TOC following       DVT prophylaxis:heparin  injection 5,000 Units Start: 10/26/23 0915     Code Status: Full Code  Family Communication: Mother at bedside on 10/8  Patient status:Inpatient  Patient is from :Home  Anticipated discharge to:SNF  Estimated DC date:after ID clearance   Consultants: Podiatry,vascular surgery  Procedures:transmetatarsal amputation of left foot ,TEE  Antimicrobials:  Anti-infectives (From admission, onward)    Start     Dose/Rate Route Frequency Ordered Stop   11/03/23 1830  levofloxacin (LEVAQUIN) IVPB 500 mg       Placed in Followed by Linked Group   500 mg 100 mL/hr over 60 Minutes Intravenous Every 48 hours 11/01/23 1617     11/03/23 1200  vancomycin  (VANCOCIN ) IVPB 1000 mg/200 mL premix  Status:  Discontinued        1,000 mg 200 mL/hr over 60 Minutes Intravenous Every M-W-F (Hemodialysis) 11/01/23 1617 11/02/23 0731   11/02/23 0731  vancomycin  variable dose per unstable renal function (pharmacist dosing)         Does not apply See admin instructions 11/02/23 0731     11/01/23 1830  vancomycin  (VANCOREADY) IVPB 2000 mg/400 mL        2,000 mg 200 mL/hr over 120 Minutes Intravenous Once 11/01/23 1608 11/01/23 2222   11/01/23 1830  levofloxacin (LEVAQUIN) IVPB 750 mg       Placed in Followed by Linked Group   750 mg 100 mL/hr over 90 Minutes Intravenous  Once 11/01/23 1617 11/01/23 2130   10/27/23 2200  amoxicillin -clavulanate (AUGMENTIN ) 500-125 MG per tablet 1 tablet        1 tablet Oral Daily at bedtime 10/27/23 0849 10/31/23 2203   10/27/23 1000  amoxicillin -clavulanate (AUGMENTIN ) 500-125 MG per tablet 1 tablet  Status:  Discontinued        1 tablet  Oral Every 12 hours 10/26/23 2155 10/27/23 0849   10/26/23 2245  amoxicillin -clavulanate (AUGMENTIN ) 500-125 MG per tablet 1 tablet  Status:  Discontinued        1 tablet Oral Every 12 hours 10/26/23 2154 10/26/23 2155   10/23/23 1800  vancomycin  (VANCOCIN ) IVPB 1000 mg/200 mL premix  Status:  Discontinued        1,000 mg 200 mL/hr over 60 Minutes Intravenous Every M-W-F (Hemodialysis) 10/23/23 1457 10/27/23 0846   10/20/23 1800  ceFEPIme  (MAXIPIME ) 1 g in sodium chloride  0.9 % 100 mL IVPB  Status:  Discontinued        1 g 200 mL/hr over 30 Minutes Intravenous Every 24 hours 10/19/23 1311 10/26/23 2154   10/20/23 1200  vancomycin  (VANCOCIN ) IVPB 1000 mg/200 mL premix  Status:  Discontinued        1,000 mg 200 mL/hr over 60 Minutes Intravenous Every M-W-F (Hemodialysis) 10/20/23 0725 10/23/23 1457   10/19/23 2200  metroNIDAZOLE  (FLAGYL ) tablet 500 mg  Status:  Discontinued        500 mg Oral Every 12 hours 10/19/23 1235 10/26/23 2154   10/19/23 1310  vancomycin  variable dose per unstable renal function (pharmacist dosing)  Status:  Discontinued         Does not apply See admin instructions 10/19/23 1311 10/20/23 0725   10/19/23 0845  ceFEPIme  (MAXIPIME ) 2 g in sodium chloride  0.9 % 100 mL IVPB        2 g 200 mL/hr over 30 Minutes Intravenous  Once 10/19/23 0839 10/19/23 1347   10/19/23 0845  metroNIDAZOLE  (FLAGYL ) IVPB 500 mg        500 mg 100 mL/hr over 60 Minutes Intravenous  Once 10/19/23 0839 10/19/23 1519   10/19/23 0845  vancomycin  (VANCOCIN ) IVPB 1000 mg/200 mL premix  Status:  Discontinued        1,000 mg 200 mL/hr over 60 Minutes Intravenous  Once 10/19/23 0839 10/19/23 0839   10/19/23 0845  vancomycin  (VANCOREADY) IVPB 2000 mg/400 mL        2,000 mg 200 mL/hr over 120 Minutes Intravenous STAT 10/19/23 0839 10/19/23 1207       Subjective: No new complaints.  Objective: Vitals:   11/03/23 1100 11/03/23 1111 11/03/23 1112 11/03/23 1215  BP: (!) 109/55 (!) 96/55 (!) 105/52    Pulse: 86 85 85 88  Resp: (!) 25 (!) 22 (!) 26 20  Temp:   97.8 F (36.6 C) 99 F (37.2 C)  TempSrc:   Oral Oral  SpO2: 96% 93% 94% 94%  Weight:   91.2 kg   Height:        Intake/Output Summary (Last 24 hours) at 11/03/2023 1640 Last data filed at 11/03/2023 1112 Gross per 24 hour  Intake --  Output 1700 ml  Net -1700 ml   Filed Weights   11/02/23 0600 11/03/23 0727 11/03/23 1112  Weight: 92.3 kg 92.4 kg 91.2 kg    Examination:  General exam: Patient is obese.  Not in any distress.  Seen in hemodialysis.   HEENT: Patient is pale. Respiratory system:  no wheezes or crackles  Cardiovascular system: S1 & S2 heard.  Gastrointestinal system: Abdomen is soft and nontender. Central nervous system: Alert and oriented Extremities: Status post amputation of left forefoot.   Data Reviewed: I have personally reviewed following labs and imaging studies  CBC: Recent Labs  Lab 10/28/23 1542 10/30/23 0900 11/01/23 0205 11/01/23 2002 11/02/23 0835 11/03/23 0224  WBC 11.5* 11.9* 10.1 20.8* 14.4* 10.0  NEUTROABS 9.2*  --  8.0* 19.0*  --   --   HGB 10.6* 9.9* 9.7* 11.3* 11.0* 10.0*  HCT 32.1* 30.6* 30.1* 35.7* 34.1* 30.6*  MCV 94.7 95.6 95.9 97.0 96.9 95.6  PLT 279 271 281 332 313 281   Basic Metabolic Panel: Recent Labs  Lab 10/30/23 0535 11/01/23 0205 11/01/23 2002 11/02/23 0220 11/03/23 0224  NA 132* 134* 134* 135 135  K 5.6* 5.5* 4.5 4.8 4.9  CL 91* 93* 90* 96* 92*  CO2 24 24 26 23 24   GLUCOSE 116* 224* 133* 101* 126*  BUN 71* 66* 33* 37* 57*  CREATININE 13.90* 12.73* 8.01* 8.96* 11.40*  CALCIUM  9.4 9.2 9.7 9.1 9.4  MG 2.4  --   --   --   --   PHOS  --  7.9*  --   --   --      Recent Results (from the past 240 hours)  Culture, blood (Routine X 2) w Reflex to ID Panel     Status: None (Preliminary result)   Collection Time: 11/01/23  7:48 PM   Specimen: BLOOD  Result Value Ref Range Status   Specimen Description BLOOD SITE NOT SPECIFIED  Final   Special  Requests   Final    BOTTLES DRAWN AEROBIC AND ANAEROBIC Blood Culture results may not be optimal  due to an inadequate volume of blood received in culture bottles   Culture   Final    NO GROWTH 2 DAYS Performed at [facility], [address], [place], [place] [address]    Report Status PENDING  Incomplete  Culture, blood (Routine X 2) w Reflex to ID Panel     Status: None (Preliminary result)   Collection Time: 11/01/23  7:57 PM   Specimen: BLOOD  Result Value Ref Range Status   Specimen Description BLOOD SITE NOT SPECIFIED  Final   Special Requests   Final    BOTTLES DRAWN AEROBIC AND ANAEROBIC Blood Culture adequate volume   Culture   Final    NO GROWTH 2 DAYS Performed at [facility], [address], [place], [place] [address]    Report Status PENDING  Incomplete     Radiology Studies: DG Chest Port 1 View Result Date: 11/01/2023 CLINICAL DATA:  Sepsis. EXAM: PORTABLE CHEST 1 VIEW COMPARISON:  October 19, 2023. FINDINGS: The heart size and mediastinal contours are within normal limits. Right lung is clear. Minimal left basilar subsegmental atelectasis is noted. The visualized skeletal structures are unremarkable. IMPRESSION: Minimal left basilar subsegmental atelectasis. Electronically Signed   By: Lynwood Landy Raddle M.D.   On: 11/01/2023 20:30    Scheduled Meds:  (feeding supplement) PROSource Plus  30 mL Oral BID BM   aspirin  EC  81 mg Oral Daily   Chlorhexidine  Gluconate Cloth  6 each Topical Q0600   cinacalcet   60 mg Oral Q T,Th,Sat-1800   [START ON 11/10/2023] darbepoetin (ARANESP ) injection - DIALYSIS  60 mcg Subcutaneous Q Fri-1800   gabapentin   100 mg Oral TID   heparin  injection (subcutaneous)  5,000 Units Subcutaneous Q8H   insulin  aspart  0-6 Units Subcutaneous TID WC   insulin  aspart  4 Units Subcutaneous TID WC   insulin  glargine  50 Units Subcutaneous Daily   multivitamin  1 tablet Oral QHS   mupirocin  cream   Topical QODAY   pantoprazole   40 mg  Oral Daily   polyethylene glycol  17 g Oral Daily   rosuvastatin   10 mg Oral Daily   senna-docusate  1 tablet Oral BID   sevelamer  carbonate  1,600 mg Oral TID WC   vancomycin  variable dose per unstable renal function (pharmacist dosing)   Does not apply See admin instructions   Continuous Infusions:  levofloxacin (LEVAQUIN) IV         LOS: 15 days   Leatrice LILLETTE Chapel, MD [facility] P10/10/2023, 4:40 PM

## 2023-11-03 NOTE — Progress Notes (Signed)
 Change of shift report rcd, pt to HD at this time via transport.  Pt on RA, no distress noted by this RN at this time.

## 2023-11-03 NOTE — Progress Notes (Signed)
 [facility]  Date of Admission:  10/19/2023    This patient 63 year old male with history of end-stage renal disease on hemodialysis, diabetes mellitus, hypertension, GERD, left foot gangrene, status post toe resection, CVA, pulmonary hypertension presented with progressive pain in the left foot along with some cough, hypoxia and fatigue. Patient also had reported of subjective fevers and chills and he was concerned that his left foot pain was progressive.  Patient had some nausea and vomiting.  He also complained of some fatigue and cough. As per the ER physician, patient was found to have coarse breath sounds and is was hypoxic.  Oxygen saturations were in the 80s.  He was placed on 2 L of oxygen by the EMS. Patient was found to be septic as he was also tachycardic and tachypneic.  Patient was febrile. Patient had undergone left fourth toe amputation couple of weeks back due to gangrene.  Patient apparently had gone home after surgery and was self managing the wound care. When he visited the clinic for follow-up he was found to have purulent drainage from the wound.  He was given oral pills for a week which he had completed couple of weeks prior to arrival. Patient had temperature of 100.8 on arrival to the ER.  He was placed on IV vancomycin , cefepime  and Flagyl .  Admitting diagnosis was sepsis secondary to surgical wound infection. Status post left foot transmetatarsal amputation on 10/23/2023.   During his hospital stay, TTE was done which did reveal possibility of a mass on anterior leaflet of mitral valve.  So TEE was done which revealed infective endocarditis.  So infectious disease consult has been called. The wound cultures did grow polymicrobial organisms.  ASSESSMENT: Sepsis, resolved. Surgical site infection of the left gangrenous foot.,  Resolved. Status post left TMA on 10/23/2023, postop day #11. Wound cultures grew moderate Enterococcus  faecalis ,few Stenotrophomonas maltophilia ,few MSSA. Since patient had complained of chills, repeat blood cultures were sent on 11/01/2023 which have been negative so far. ESR-116, CRP is 2.3. Stenotrophomonas maltophilia is sensitive to levofloxacin as well as Bactrim. Pathology revealed that the margins were clear of active infection. TEE - mitral valve endocarditis.  As per cardiology, plan to repeat echo once antibiotic course is complete. Patient is currently on IV Levaquin 500 mg every 48 hours. Patient is also on IV vancomycin , dosed as per pharmacy. Vancomycin  levels were supratherapeutic.  Pharmacy helps with the dosing.  This was a random level. End-stage renal disease on hemodialysis. Patient was placed on oral Augmentin  for a week on the outside and in-house he was on cefepime , Vanco and Flagyl .  The antibiotics were then stopped on October 26, 2023.   PLAN: Continue IV Levaquin and IV vancomycin . ID will follow along and decide upon the choice and duration of antibiotics. Since patient is not a candidate for PICC line placement, patient probably will be discharged on IV Vanco and cefepime  during hemodialysis.  Nephrology input greatly appreciated. Feasible, please consider podiatry follow-up before discharge.  Stenotrophomonas maltophilia will not be covered by cefepime .  Since the margins were clear of infection, we are focusing on the positive organisms such as MSSA and Enterococcus faecalis causing endocarditis.  Principal Problem:   Sepsis (HCC) Active Problems:   Diabetes mellitus type 2 with complications (HCC)   ESRD on dialysis (HCC)   Gangrene of left foot (HCC)   Pyogenic inflammation of bone (HCC)   Neuropathy  PAD (peripheral artery disease)   Bradycardia   PAF (paroxysmal atrial fibrillation) (HCC)   Abnormal echocardiogram   Bacterial endocarditis   Endocarditis of mitral valve    (feeding supplement) PROSource Plus  30 mL Oral BID BM   aspirin  EC  81  mg Oral Daily   Chlorhexidine  Gluconate Cloth  6 each Topical Q0600   cinacalcet   60 mg Oral Q T,Th,Sat-1800   [START ON 11/10/2023] darbepoetin (ARANESP ) injection - DIALYSIS  60 mcg Subcutaneous Q Fri-1800   gabapentin   100 mg Oral TID   heparin  injection (subcutaneous)  5,000 Units Subcutaneous Q8H   insulin  aspart  0-6 Units Subcutaneous TID WC   insulin  aspart  4 Units Subcutaneous TID WC   insulin  glargine  50 Units Subcutaneous Daily   multivitamin  1 tablet Oral QHS   mupirocin  cream   Topical QODAY   pantoprazole   40 mg Oral Daily   polyethylene glycol  17 g Oral Daily   rosuvastatin   10 mg Oral Daily   senna-docusate  1 tablet Oral BID   sevelamer  carbonate  1,600 mg Oral TID WC   vancomycin  variable dose per unstable renal function (pharmacist dosing)   Does not apply See admin instructions    SUBJECTIVE: Patient appears weak.  He is undergoing hemodialysis.  Review of Systems: Review of Systems  Constitutional:  Positive for malaise/fatigue.  HENT: Negative.    Eyes: Negative.   Respiratory: Negative.    Cardiovascular: Negative.   Gastrointestinal: Negative.   Genitourinary: Negative.   Musculoskeletal: Negative.   Endo/Heme/Allergies: Negative.   Psychiatric/Behavioral: Negative.      Allergies  Allergen Reactions   Codeine Anaphylaxis, Hives, Swelling and Other (See Comments)    Swelling all over body and the throat   Clindamycin /Lincomycin Nausea And Vomiting    OBJECTIVE: Vitals:   11/03/23 1100 11/03/23 1111 11/03/23 1112 11/03/23 1215  BP: (!) 109/55 (!) 96/55 (!) 105/52   Pulse: 86 85 85 88  Resp: (!) 25 (!) 22 (!) 26 20  Temp:   97.8 F (36.6 C) 99 F (37.2 C)  TempSrc:   Oral Oral  SpO2: 96% 93% 94% 94%  Weight:   91.2 kg   Height:       Body mass index is 30.57 kg/m.  Physical Exam Constitutional:      Appearance: He is ill-appearing.     Comments: Patient appears to be older than his stated age.  HENT:     Head: Normocephalic.      Right Ear: External ear normal.     Left Ear: External ear normal.     Mouth/Throat:     Mouth: Mucous membranes are moist.  Eyes:     Pupils: Pupils are equal, round, and reactive to light.  Cardiovascular:     Rate and Rhythm: Normal rate and regular rhythm.     Pulses: Normal pulses.     Heart sounds: Normal heart sounds.  Pulmonary:     Effort: Pulmonary effort is normal.     Breath sounds: Normal breath sounds.  Abdominal:     General: Bowel sounds are normal.     Palpations: Abdomen is soft.  Musculoskeletal:     Cervical back: Normal range of motion.     Right lower leg: Edema present.     Left lower leg: Edema present.     Comments: Left upper extremity AV fistula present.  Bruit present, thrill present.  Skin:    Findings: Lesion present.  Neurological:  Mental Status: He is oriented to person, place, and time. Mental status is at baseline.  Psychiatric:        Mood and Affect: Mood normal.        Behavior: Behavior normal.        Thought Content: Thought content normal.        Judgment: Judgment normal.     Lab Results Lab Results  Component Value Date   WBC 10.0 11/03/2023   HGB 10.0 (L) 11/03/2023   HCT 30.6 (L) 11/03/2023   MCV 95.6 11/03/2023   PLT 281 11/03/2023    Lab Results  Component Value Date   CREATININE 11.40 (H) 11/03/2023   BUN 57 (H) 11/03/2023   NA 135 11/03/2023   K 4.9 11/03/2023   CL 92 (L) 11/03/2023   CO2 24 11/03/2023    Lab Results  Component Value Date   ALT 16 11/01/2023   AST 21 11/01/2023   GGT 668 (H) 10/20/2018   ALKPHOS 248 (H) 11/01/2023   BILITOT 1.1 11/01/2023     Microbiology: Recent Results (from the past 240 hours)  Culture, blood (Routine X 2) w Reflex to ID Panel     Status: None (Preliminary result)   Collection Time: 11/01/23  7:48 PM   Specimen: BLOOD  Result Value Ref Range Status   Specimen Description BLOOD SITE NOT SPECIFIED  Final   Special Requests   Final    BOTTLES DRAWN AEROBIC AND  ANAEROBIC Blood Culture results may not be optimal due to an inadequate volume of blood received in culture bottles   Culture   Final    NO GROWTH 2 DAYS Performed at [facility], [address], [place], [place] [address]    Report Status PENDING  Incomplete  Culture, blood (Routine X 2) w Reflex to ID Panel     Status: None (Preliminary result)   Collection Time: 11/01/23  7:57 PM   Specimen: BLOOD  Result Value Ref Range Status   Specimen Description BLOOD SITE NOT SPECIFIED  Final   Special Requests   Final    BOTTLES DRAWN AEROBIC AND ANAEROBIC Blood Culture adequate volume   Culture   Final    NO GROWTH 2 DAYS Performed at [facility], [address], [place], [place] [address]    Report Status PENDING  Incomplete    Dr. Zoanne, [facility] for Infectious Disease [facility] Cell phone: [phone]  @TODAY @ 2:45 PM   Total Encounter Time: 50 minutes

## 2023-11-03 NOTE — Progress Notes (Signed)
 [facility] Progress Note   Subjective:    Seen and examined.  No big issues today.      Objective Vitals:   11/03/23 0851 11/03/23 0900 11/03/23 0930 11/03/23 1000  BP: (!) 94/52 (!) 100/51 96/63 (!) 99/56  Pulse: 80 80 86 86  Resp: (!) 23 (!) 25 (!) 21 19  Temp:      TempSrc:      SpO2: 94% 95% 94% 95%  Weight:      Height:       Physical Exam General: alert, nad  Heart: RRR Resp: nml WOB on RA Abdomen: non distended Extremities: no LE edema, L foot dressed , some dried blood on dressing, + burnished skin Dialysis Access: LU AVF +bruit   Filed Weights   11/01/23 1430 11/02/23 0600 11/03/23 0727  Weight: 94.1 kg 92.3 kg 92.4 kg   No intake or output data in the 24 hours ending 11/03/23 1028   Additional Objective Labs: Basic Metabolic Panel: Recent Labs  Lab 11/01/23 0205 11/01/23 2002 11/02/23 0220 11/03/23 0224  NA 134* 134* 135 135  K 5.5* 4.5 4.8 4.9  CL 93* 90* 96* 92*  CO2 24 26 23 24   GLUCOSE 224* 133* 101* 126*  BUN 66* 33* 37* 57*  CREATININE 12.73* 8.01* 8.96* 11.40*  CALCIUM  9.2 9.7 9.1 9.4  PHOS 7.9*  --   --   --    Liver Function Tests: Recent Labs  Lab 10/28/23 1542 11/01/23 0205 11/01/23 2002  AST 16  --  21  ALT 15  --  16  ALKPHOS 215*  --  248*  BILITOT 0.6  --  1.1  PROT 6.9  --  8.3*  ALBUMIN 2.6* 2.5* 2.9*   No results for input(s): LIPASE, AMYLASE in the last 168 hours. CBC: Recent Labs  Lab 10/28/23 1542 10/30/23 0900 11/01/23 0205 11/01/23 2002 11/02/23 0835 11/03/23 0224  WBC 11.5* 11.9* 10.1 20.8* 14.4* 10.0  NEUTROABS 9.2*  --  8.0* 19.0*  --   --   HGB 10.6* 9.9* 9.7* 11.3* 11.0* 10.0*  HCT 32.1* 30.6* 30.1* 35.7* 34.1* 30.6*  MCV 94.7 95.6 95.9 97.0 96.9 95.6  PLT 279 271 281 332 313 281   Blood Culture    Component Value Date/Time   SDES BLOOD SITE NOT SPECIFIED 11/01/2023 1957   SPECREQUEST  11/01/2023 1957    BOTTLES DRAWN AEROBIC AND ANAEROBIC Blood Culture adequate volume    CULT  11/01/2023 1957    NO GROWTH 2 DAYS Performed at [facility], [address], [place], [place] [address]    REPTSTATUS PENDING 11/01/2023 1957    Cardiac Enzymes: No results for input(s): CKTOTAL, CKMB, CKMBINDEX, TROPONINI in the last 168 hours. CBG: Recent Labs  Lab 11/02/23 0636 11/02/23 1204 11/02/23 1608 11/02/23 2212 11/03/23 0619  GLUCAP 108* 128* 176* 107* 151*   Iron Studies: No results for input(s): IRON, TIBC, TRANSFERRIN, FERRITIN in the last 72 hours. Lab Results  Component Value Date   INR 1.1 11/01/2023   INR 1.1 10/19/2023   INR 1.0 11/14/2018   Studies/Results: DG Chest Port 1 View Result Date: 11/01/2023 CLINICAL DATA:  Sepsis. EXAM: PORTABLE CHEST 1 VIEW COMPARISON:  October 19, 2023. FINDINGS: The heart size and mediastinal contours are within normal limits. Right lung is clear. Minimal left basilar subsegmental atelectasis is noted. The visualized skeletal structures are unremarkable. IMPRESSION: Minimal left basilar subsegmental atelectasis. Electronically Signed   By: Lynwood Landy Mickey CHRISTELLA.D.  On: 11/01/2023 20:30   ECHO TEE Result Date: 11/01/2023    TRANSESOPHOGEAL ECHO REPORT   Patient Name:   Edgar Brewer Date of Exam: 11/01/2023 Medical Rec #:  [id number]    Height:       68.0 in Accession #:    [id number]   Weight:       207.9 lb Date of Birth:  07/23/1960    BSA:          2.078 m Patient Age:    63 years     BP:           112/39 mmHg Patient Gender: M            HR:           68 bpm. Exam Location:  Inpatient Procedure: Transesophageal Echo, 3D Echo, Cardiac Doppler and Color Doppler            (Both Spectral and Color Flow Doppler were utilized during            procedure). Indications:     Endocarditis  History:         Patient has prior history of Echocardiogram examinations, most                  recent 10/31/2023. Stroke; Risk Factors:Diabetes and                  Hypertension.  Sonographer:     Jayson Gaskins Referring Phys:   [id number] MAUDE JAYSON EMMER Diagnosing Phys: Georganna Archer PROCEDURE: After discussion of the risks and benefits of a TEE, an informed consent was obtained from the patient. The transesophogeal probe was passed without difficulty through the esophogus of the patient. Sedation performed by different physician. The patient was monitored while under deep sedation. Anesthestetic sedation was provided intravenously by Anesthesiology: 250mg  of Propofol , 50mg  of Lidocaine . Image quality was excellent. The patient's vital signs; including heart rate, blood pressure, and oxygen saturation; remained stable throughout the procedure. The patient developed no complications during the procedure.  IMPRESSIONS  1. Left ventricular ejection fraction, by estimation, is 60 to 65%. The left ventricle has normal function. The left ventricle has no regional wall motion abnormalities.  2. Right ventricular systolic function is normal. The right ventricular size is normal.  3. No left atrial/left atrial appendage thrombus was detected.  4. There is a calcified mass on the anterior MV leaflet measuring 0.8 cm x 1.3 cm that moves in concert with the anterior MV leaflet. This mass is favored to be calcific thickening of the leaflet, but an infective component cannot be ruled out. Associated with this calcified mass is an independently mobile echodensity measuring 0.4 cm x 0.8 cm present on the LV side of the anterior MV leaflet. 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 The mitral valve is abnormal. Mild mitral valve regurgitation.  5. The aortic valve is calcified. Aortic valve regurgitation is not visualized. Mild aortic valve stenosis. Aortic valve mean gradient measures 18.0 mmHg. Aortic valve Vmax measures 2.10 m/s.  6. Evidence of atrial level shunting detected by color flow Doppler. There is a small patent foramen ovale.  7. 3D performed of the mitral valve. Conclusion(s)/Recommendation(s): These findings,  particularly in the setting of active infection, are favored to represent infective endocarditis. I recommend consultation to ID for further assessment/management. FINDINGS  Left Ventricle: Left ventricular ejection fraction, by estimation, is 60 to 65%. The left ventricle has normal function. The left ventricle has  no regional wall motion abnormalities. The left ventricular internal cavity size was normal in size. Right Ventricle: The right ventricular size is normal. No increase in right ventricular wall thickness. Right ventricular systolic function is normal. Left Atrium: Left atrial size was normal in size. No left atrial/left atrial appendage thrombus was detected. Right Atrium: Right atrial size was normal in size. Pericardium: Trivial pericardial effusion is present. The pericardial effusion is circumferential. Mitral Valve: There is a calcified mass on the anterior MV leaflet measuring 0.8 cm x 1.3 cm that moves in concert with the anterior MV leaflet. This mass is favored to be calcific thickening of the leaflet, but an infective component cannot be ruled out. Associated with this calcified mass is an independently mobile echodensity measuring 0.4 cm x 0.8 cm present on the LV side of the anterior MV leaflet. 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 The mitral valve is abnormal. There is severe thickening of the anterior mitral valve leaflet(s). There is severe calcification of the anterior mitral valve leaflet(s). Normal mobility of the mitral valve leaflets. Mild mitral valve regurgitation. Tricuspid Valve: The tricuspid valve is grossly normal. Tricuspid valve regurgitation is trivial. Aortic Valve: The aortic valve is calcified. Aortic valve regurgitation is not visualized. Mild aortic stenosis is present. Aortic valve mean gradient measures 18.0 mmHg. Aortic valve peak gradient measures 17.6 mmHg. Pulmonic Valve: The pulmonic valve was grossly normal. Pulmonic valve  regurgitation is trivial. Aorta: The aortic root, ascending aorta and aortic arch are all structurally normal, with no evidence of dilitation or obstruction. IAS/Shunts: The interatrial septum appears to be lipomatous. Evidence of atrial level shunting detected by color flow Doppler. A small patent foramen ovale is detected. Additional Comments: 3D was performed not requiring image post processing on an independent workstation and was abnormal. AORTIC VALVE AV Vmax:      210.00 cm/s AV Peak Grad: 17.6 mmHg AV Mean Grad: 18.0 mmHg Georganna Archer Electronically signed by Georganna Archer Signature Date/Time: 11/01/2023/11:33:50 AM    Final     Medications:  levofloxacin (LEVAQUIN) IV      (feeding supplement) PROSource Plus  30 mL Oral BID BM   aspirin  EC  81 mg Oral Daily   Chlorhexidine  Gluconate Cloth  6 each Topical Q0600   cinacalcet   60 mg Oral Q T,Th,Sat-1800   [START ON 11/10/2023] darbepoetin (ARANESP ) injection - DIALYSIS  60 mcg Subcutaneous Q Fri-1800   gabapentin   100 mg Oral TID   heparin  injection (subcutaneous)  5,000 Units Subcutaneous Q8H   insulin  aspart  0-6 Units Subcutaneous TID WC   insulin  aspart  4 Units Subcutaneous TID WC   insulin  glargine  50 Units Subcutaneous Daily   multivitamin  1 tablet Oral QHS   mupirocin  cream   Topical QODAY   pantoprazole   40 mg Oral Daily   polyethylene glycol  17 g Oral Daily   rosuvastatin   10 mg Oral Daily   senna-docusate  1 tablet Oral BID   sevelamer  carbonate  1,600 mg Oral TID WC   vancomycin  variable dose per unstable renal function (pharmacist dosing)   Does not apply See admin instructions    Dialysis Orders: MWF - NW 4hr, 400/A1.5, EDW 96.8kg, 2K/2.5Ca bath, AVF, Heparin  2600 - no ESA, Hgb > goal - Calcitriol 1.75 three times per week  Assessment/Plan: Sepsis/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 9/29 tissue of left foot cx: Enterococcus faecalis (pan-sensitive), MSSA  (pan-sensitive), Stenotrophomonas maltophilia (pan-sensitive). S/p  TEE showing calcific mass on MV leaflet consistent with endocarditis.  Appreciate cardiology and ID.  Please note that he is not a PICC candidate in the setting of ESRD. 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  Continue usual MWF HD - Next HD 11/03/23 Hypertension/volume. BP controlled, min LE edema, but low Na - UF as tolerated. Anemia. Hgb 10.6. No indication for ESA at this time.  Metabolic bone disease.  CorrCa high sided, VDRA on hold. Phos above goal. Continue home binders/cinacalcet . Nutrition: Alb low, continue supplements.  Renal diet w/fluid restrictions.  T2DM: Insulin  per primary. DVT prophylaxis: Prefer heparin  over Lovenox  in ESRD patient Dispo: SNF recommended by PT Bradycardia - HR to 30s on 10/5. Cardiology consulted. Report Wenckeback conduction.  No additional work up indicated. Recommend K>4 and Mg>2, avoid AV nodal blockers.   Almarie Bonine MD  [facility] 11/03/2023,10:28 AM  LOS: 15 days

## 2023-11-03 NOTE — Progress Notes (Signed)
 Received patient in bed to unit.  Alert and orientedx4 Informed consent signed and in chart.   TX duration:3.25 hours  Patient tolerated well.  Transported back to the room  Alert, without acute distress.  Hand-off given to patient's nurse.   Access used: avf Access issues: none  Total UF removed: 1700 Medication(s) given: heparin  bolous 2600 units, benadryl  25mg  Post HD VS: see table below Post HD weight: 91.2kg   11/03/23 1112  Vitals  Temp 97.8 F (36.6 C)  Temp Source Oral  BP (!) 105/52  MAP (mmHg) 69  BP Location Right Arm  BP Method Automatic  Patient Position (if appropriate) Lying  Pulse Rate 85  Pulse Rate Source Monitor  ECG Heart Rate 85  Resp (!) 26  Weight 91.2 kg  Type of Weight Post-Dialysis  Oxygen Therapy  SpO2 94 %  O2 Device Room Air  Patient Activity (if Appropriate) In bed  Pulse Oximetry Type Continuous  During Treatment Monitoring  Blood Flow Rate (mL/min) 0 mL/min  Arterial Pressure (mmHg) -103.23 mmHg  Venous Pressure (mmHg) 202.21 mmHg  TMP (mmHg) 14.95 mmHg  Ultrafiltration Rate (mL/min) 715 mL/min  Dialysate Flow Rate (mL/min) 299 ml/min  Dialysate Potassium Concentration 2  Dialysate Calcium  Concentration 2.5  Duration of HD Treatment -hour(s) 3.25 hour(s)  Cumulative Fluid Removed (mL) per Treatment  1700.15  HD Safety Checks Performed Yes  Intra-Hemodialysis Comments Tolerated well  Post Treatment  Dialyzer Clearance Lightly streaked  Hemodialysis Intake (mL) 100 mL  Liters Processed 72  Fluid Removed (mL) 1700 mL  Tolerated HD Treatment Yes  Post-Hemodialysis Comments goal lowered t0 1700 due to pressure. md aware  AVG/AVF Arterial Site Held (minutes) 5 minutes  AVG/AVF Venous Site Held (minutes) 5 minutes  Fistula / Graft Left Upper arm Arteriovenous fistula  Placement Date/Time: 05/17/20 2115   Placed prior to admission: Yes  Orientation: Left  Access Location: Upper arm  Access Type: Arteriovenous fistula  Site  Condition No complications  Fistula / Graft Assessment Present;Thrill;Bruit  Status Deaccessed;Flushed;Patent      Lorrene KANDICE [facility]

## 2023-11-03 NOTE — TOC Progression Note (Addendum)
 Transition of Care [facility]) - Progression Note    Patient Details  Name: Edgar Brewer MRN: [id number] Date of Birth: March 25, 1960  Transition of Care [facility]) CM/SW Contact  Luise JAYSON Pan, [place] Phone Number: 11/03/2023, 4:00 PM  Clinical Narrative:   CSW updated patients mother on plan for disposition. CSW updated facility. Per ID note, patient not a candidate for PICC line. CSW will continue to follow for final IV abx recs.   4:15 PM Per Heywood Hertz, patients insurance authorization is approved until Sunday 10/12. If patient is admitting over the weekend, please reach out to [facility] [phone].  CSW will continue to follow.                        Expected Discharge Plan and Services         Expected Discharge Date: 10/31/23                                     Social Drivers of Health (SDOH) Interventions SDOH Screenings   Food Insecurity: No Food Insecurity (10/20/2023)  Housing: Low Risk  (10/20/2023)  Transportation Needs: No Transportation Needs (10/20/2023)  Utilities: Not At Risk (10/20/2023)  Depression (PHQ2-9): Low Risk  (09/19/2023)  Financial Resource Strain: Low Risk  (04/19/2022)  Physical Activity: Inactive (04/19/2022)  Social Connections: Unknown (04/19/2022)  Stress: No Stress Concern Present (04/19/2022)  Tobacco Use: Medium Risk (10/23/2023)    Readmission Risk Interventions    10/20/2023    4:09 PM  Readmission Risk Prevention Plan  Transportation Screening Complete  PCP or Specialist Appt within 3-5 Days Complete  HRI or Home Care Consult Complete  Social Work Consult for Recovery Care Planning/Counseling Complete  Palliative Care Screening Not Applicable

## 2023-11-03 NOTE — Procedures (Signed)
 Patient seen and examined on Hemodialysis. The procedure was supervised and I have made appropriate changes. BP (!) 99/56 (BP Location: Right Arm)   Pulse 86   Temp 98.1 F (36.7 C) (Oral)   Resp 19   Ht 5' 8 (1.727 m)   Wt 92.4 kg   SpO2 95%   BMI 30.97 kg/m   QB 400 mL/ min via AVF, UF goal 2L  Tolerating treatment without complaints at this time.   Almarie Bonine MD [facility] [phone] 10:31 AM

## 2023-11-03 NOTE — Progress Notes (Signed)
  Progress Note  Patient Name: Edgar Brewer Date of Encounter: 11/03/2023 [facility] Cardiologist: Maude Emmer, MD   Interval Summary   Seen in dialysis  Doing well, no complaints  Plans to discharge to SNF in upcoming days   Vital Signs Vitals:   11/03/23 0800 11/03/23 0830 11/03/23 0851 11/03/23 0900  BP: (!) 99/56 98/65 (!) 94/52 (!) 100/51  Pulse: 80 84 80 80  Resp: (!) 22 (!) 22 (!) 23 (!) 25  Temp:      TempSrc:      SpO2: 93% 94% 94% 95%  Weight:      Height:       No intake or output data in the 24 hours ending 11/03/23 0923     11/03/2023    7:27 AM 11/02/2023    6:00 AM 11/01/2023    2:30 PM  Last 3 Weights  Weight (lbs) 203 lb 11.3 oz 203 lb 8 oz 207 lb 7.3 oz  Weight (kg) 92.4 kg 92.307 kg 94.1 kg     Telemetry/ECG  Sinus rhythm, 1st degree AVB, HR 70-80s - Personally Reviewed  Physical Exam  GEN: No acute distress.   Neck: No JVD Cardiac: RRR  Respiratory: Clear to auscultation bilaterally. GI: Soft, nontender, non-distended  MS: No edema  Assessment & Plan   Mitral valve endocarditis  TEE 11/01/23: LVEF 60-65%, 0.8x1.3 cm mass on anterior MV leaflet  Infectious disease recommends 6 weeks antibiotics  Plan to repeat echo once antibiotic course complete   Bradycardia  1st degree AVB Seen by EP this admission No long pauses noted  No further workup suggested  HRs 70-80s last 24hr Continue to follow on telemetry while inpatient   Per primary Sepsis  Left foot infection Type 2 DM ESRD on HD  Neuropathy PAD Constipation    For questions or updates, please contact [facility] Please consult [email or web address] for contact info under         Signed, Waddell DELENA Donath, PA-C

## 2023-11-04 DIAGNOSIS — I330 Acute and subacute infective endocarditis: Secondary | ICD-10-CM

## 2023-11-04 DIAGNOSIS — N186 End stage renal disease: Secondary | ICD-10-CM | POA: Diagnosis not present

## 2023-11-04 DIAGNOSIS — M86072 Acute hematogenous osteomyelitis, left ankle and foot: Secondary | ICD-10-CM | POA: Diagnosis not present

## 2023-11-04 DIAGNOSIS — I059 Rheumatic mitral valve disease, unspecified: Secondary | ICD-10-CM | POA: Diagnosis not present

## 2023-11-04 LAB — VANCOMYCIN, RANDOM: Vancomycin Rm: 25 ug/mL

## 2023-11-04 LAB — GLUCOSE, CAPILLARY
Glucose-Capillary: 121 mg/dL — ABNORMAL HIGH (ref 70–99)
Glucose-Capillary: 205 mg/dL — ABNORMAL HIGH (ref 70–99)
Glucose-Capillary: 216 mg/dL — ABNORMAL HIGH (ref 70–99)
Glucose-Capillary: 117 mg/dL — ABNORMAL HIGH (ref 70–99)

## 2023-11-04 MED ORDER — SODIUM CHLORIDE 0.9 % IV SOLN
1.0000 g | INTRAVENOUS | Status: DC
  Administered 2023-11-06: 1 g via INTRAVENOUS
  Filled 2023-11-04: qty 1

## 2023-11-04 NOTE — Progress Notes (Addendum)
 [facility]    Date of Admission:  10/19/2023   Total days of antibiotics 3   ID: Edgar Brewer is a 63 y.o. male with  left foot osteomyelitis/gangrenous changes s/p TMA on 9/29  (cx showing mssa, efaecalis, stenotrophomonas), path showed clear margins. underwent TEE that showed MV endocarditis. Blood cx remain NGTD Principal Problem:   Sepsis (HCC) Active Problems:   Diabetes mellitus type 2 with complications (HCC)   ESRD on dialysis (HCC)   Gangrene of left foot (HCC)   Pyogenic inflammation of bone (HCC)   Neuropathy   PAD (peripheral artery disease)   Bradycardia   PAF (paroxysmal atrial fibrillation) (HCC)   Abnormal echocardiogram   Bacterial endocarditis   Endocarditis of mitral valve    Subjective: Afebrile, sleepy this morning  Medications:   (feeding supplement) PROSource Plus  30 mL Oral BID BM   aspirin  EC  81 mg Oral Daily   Chlorhexidine  Gluconate Cloth  6 each Topical Q0600   cinacalcet   60 mg Oral Q T,Th,Sat-1800   [START ON 11/10/2023] darbepoetin (ARANESP ) injection - DIALYSIS  60 mcg Subcutaneous Q Fri-1800   gabapentin   100 mg Oral TID   heparin  injection (subcutaneous)  5,000 Units Subcutaneous Q8H   insulin  aspart  0-6 Units Subcutaneous TID WC   insulin  aspart  4 Units Subcutaneous TID WC   insulin  glargine  50 Units Subcutaneous Daily   multivitamin  1 tablet Oral QHS   mupirocin  cream   Topical QODAY   pantoprazole   40 mg Oral Daily   polyethylene glycol  17 g Oral Daily   rosuvastatin   10 mg Oral Daily   senna-docusate  1 tablet Oral BID   sevelamer  carbonate  1,600 mg Oral TID WC   vancomycin  variable dose per unstable renal function (pharmacist dosing)   Does not apply See admin instructions    Objective: Vital signs in last 24 hours: Temp:  [97.7 F (36.5 C)-98.6 F (37 C)] 97.8 F (36.6 C) (10/11 0817) Pulse Rate:  [73-93] 76 (10/11 0817) Resp:  [16-28] 20 (10/11 0817) BP: (94-104)/(46-56) 95/49 (10/11  0817) SpO2:  [92 %-98 %] 97 % (10/11 0817)  Physical Exam  Constitutional: He is oriented to person, place, and time. He appears well-developed and well-nourished. No distress.  HENT:  Mouth/Throat: Oropharynx is clear and moist. No oropharyngeal exudate.  Cardiovascular: Normal rate, regular rhythm and normal heart sounds. Exam reveals no gallop and no friction rub.  No murmur heard.  Pulmonary/Chest: Effort normal and breath sounds normal. No respiratory distress. He has no wheezes.  Abdominal: Soft. Bowel sounds are normal. He exhibits no distension. There is no tenderness.  Ext: left foot wrapped Neurological: He is alert and oriented to person, place, and time.  Skin: Skin is warm and dry. No rash noted. No erythema.  Psychiatric: He has a normal mood and affect. His behavior is normal.    Lab Results Recent Labs    11/02/23 0220 11/02/23 0835 11/03/23 0224  WBC  --  14.4* 10.0  HGB  --  11.0* 10.0*  HCT  --  34.1* 30.6*  NA 135  --  135  K 4.8  --  4.9  CL 96*  --  92*  CO2 23  --  24  BUN 37*  --  57*  CREATININE 8.96*  --  11.40*   Liver Panel Recent Labs    11/01/23 2002  PROT 8.3*  ALBUMIN 2.9*  AST 21  ALT 16  ALKPHOS 248*  BILITOT 1.1   Sedimentation Rate Recent Labs    11/01/23 2002  ESRSEDRATE 116*   C-Reactive Protein Recent Labs    11/01/23 2002  CRP 2.3*    Microbiology: reviewed Studies/Results: TEE on 10/8: IMPRESSIONS     1. Left ventricular ejection fraction, by estimation, is 60 to 65%. The  left ventricle has normal function. The left ventricle has no regional  wall motion abnormalities.   2. Right ventricular systolic function is normal. The right ventricular  size is normal.   3. No left atrial/left atrial appendage thrombus was detected.   4. There is a calcified mass on the anterior MV leaflet measuring 0.8 cm  x 1.3 cm that moves in concert with the anterior MV leaflet. This mass is  favored to be calcific thickening  of the leaflet, but an infective  component cannot be ruled out.  Associated with this calcified mass is an independently mobile echodensity  measuring 0.4 cm x 0.8 cm present on the LV side of the anterior MV  leaflet. This smaller mass is favored to represent infective  endocarditis.. The mitral valve is abnormal. Mild  mitral valve regurgitation.   5. The aortic valve is calcified. Aortic valve regurgitation is not  visualized. Mild aortic valve stenosis. Aortic valve mean gradient  measures 18.0 mmHg. Aortic valve Vmax measures 2.10 m/s.   6. Evidence of atrial level shunting detected by color flow Doppler.  There is a small patent foramen ovale.   7. 3D performed of the mitral valve.    Assessment/Plan: MV native valve endocarditis in the setting of OM of left foot = plan to continue with vancomycin  with HD m-w-f. Plan for 6 wk using 10/8 as day 1.   Please add ceftaz to give with HD, as part of culture negative endocarditis.  End of therapy will be -nov 19th.  Left foot osteomyelitis/gangrenous foot s/p Transmetatarsal amputation = now has source control. Recommend to continue with wound care as noted by podiatry. Cx were polymicrobial, but since he has had amputation and clear margins on 9/28. No need for further treatment  ESRD = per renal, continue with m-w-f  schedule.  evaluation of this patient requires complex antimicrobial therapy evaluation and counseling and isolation needs for disease transmission risk assessment and mitigation.    Will sign off.     [facility] Pager: [phone]  11/04/2023, 1:40 PM

## 2023-11-04 NOTE — Progress Notes (Signed)
 [facility] Progress Note   Subjective:    Seen and examined.  Eating a bagel.  Mom at bedside.  Multiple questions about dischage- answered.  Objective Vitals:   11/03/23 2355 11/04/23 0425 11/04/23 0445 11/04/23 0817  BP: (!) 104/46 (!) 94/54 (!) 101/50 (!) 95/49  Pulse: 86 77 73 76  Resp: 16 (!) 22 20 20   Temp: 98.5 F (36.9 C) 97.7 F (36.5 C)  97.8 F (36.6 C)  TempSrc: Oral Oral  Oral  SpO2: 95% 92%  97%  Weight:      Height:       Physical Exam General: alert, nad  Heart: RRR Resp: nml WOB on RA Abdomen: non distended Extremities: no LE edema, L foot dressed , some dried blood on dressing, + burnished skin Dialysis Access: LU AVF +bruit   Filed Weights   11/02/23 0600 11/03/23 0727 11/03/23 1112  Weight: 92.3 kg 92.4 kg 91.2 kg    Intake/Output Summary (Last 24 hours) at 11/04/2023 1013 Last data filed at 11/03/2023 2000 Gross per 24 hour  Intake 240 ml  Output 1700 ml  Net -1460 ml     Additional Objective Labs: Basic Metabolic Panel: Recent Labs  Lab 11/01/23 0205 11/01/23 2002 11/02/23 0220 11/03/23 0224  NA 134* 134* 135 135  K 5.5* 4.5 4.8 4.9  CL 93* 90* 96* 92*  CO2 24 26 23 24   GLUCOSE 224* 133* 101* 126*  BUN 66* 33* 37* 57*  CREATININE 12.73* 8.01* 8.96* 11.40*  CALCIUM  9.2 9.7 9.1 9.4  PHOS 7.9*  --   --   --    Liver Function Tests: Recent Labs  Lab 10/28/23 1542 11/01/23 0205 11/01/23 2002  AST 16  --  21  ALT 15  --  16  ALKPHOS 215*  --  248*  BILITOT 0.6  --  1.1  PROT 6.9  --  8.3*  ALBUMIN 2.6* 2.5* 2.9*   No results for input(s): LIPASE, AMYLASE in the last 168 hours. CBC: Recent Labs  Lab 10/28/23 1542 10/30/23 0900 11/01/23 0205 11/01/23 2002 11/02/23 0835 11/03/23 0224  WBC 11.5* 11.9* 10.1 20.8* 14.4* 10.0  NEUTROABS 9.2*  --  8.0* 19.0*  --   --   HGB 10.6* 9.9* 9.7* 11.3* 11.0* 10.0*  HCT 32.1* 30.6* 30.1* 35.7* 34.1* 30.6*  MCV 94.7 95.6 95.9 97.0 96.9 95.6  PLT 279 271 281  332 313 281   Blood Culture    Component Value Date/Time   SDES BLOOD SITE NOT SPECIFIED 11/01/2023 1957   SPECREQUEST  11/01/2023 1957    BOTTLES DRAWN AEROBIC AND ANAEROBIC Blood Culture adequate volume   CULT  11/01/2023 1957    NO GROWTH 3 DAYS Performed at [facility], [address] [place], [place] [address]    REPTSTATUS PENDING 11/01/2023 1957    Cardiac Enzymes: No results for input(s): CKTOTAL, CKMB, CKMBINDEX, TROPONINI in the last 168 hours. CBG: Recent Labs  Lab 11/03/23 0619 11/03/23 1214 11/03/23 1613 11/03/23 2056 11/04/23 0550  GLUCAP 151* 100* 165* 172* 121*   Iron Studies: No results for input(s): IRON, TIBC, TRANSFERRIN, FERRITIN in the last 72 hours. Lab Results  Component Value Date   INR 1.1 11/01/2023   INR 1.1 10/19/2023   INR 1.0 11/14/2018   Studies/Results: No results found.   Medications:  levofloxacin (LEVAQUIN) IV 500 mg (11/03/23 2033)    (feeding supplement) PROSource Plus  30 mL Oral BID BM   aspirin  EC  81 mg Oral Daily   Chlorhexidine  Gluconate Cloth  6 each Topical Q0600   cinacalcet   60 mg Oral Q T,Th,Sat-1800   [START ON 11/10/2023] darbepoetin (ARANESP ) injection - DIALYSIS  60 mcg Subcutaneous Q Fri-1800   gabapentin   100 mg Oral TID   heparin  injection (subcutaneous)  5,000 Units Subcutaneous Q8H   insulin  aspart  0-6 Units Subcutaneous TID WC   insulin  aspart  4 Units Subcutaneous TID WC   insulin  glargine  50 Units Subcutaneous Daily   multivitamin  1 tablet Oral QHS   mupirocin  cream   Topical QODAY   pantoprazole   40 mg Oral Daily   polyethylene glycol  17 g Oral Daily   rosuvastatin   10 mg Oral Daily   senna-docusate  1 tablet Oral BID   sevelamer  carbonate  1,600 mg Oral TID WC   vancomycin  variable dose per unstable renal function (pharmacist dosing)   Does not apply See admin instructions    Dialysis Orders: MWF - NW 4hr, 400/A1.5, EDW 96.8kg, 2K/2.5Ca bath, AVF, Heparin  2600 -  no ESA, Hgb > goal - Calcitriol 1.75 three times per week  Assessment/Plan: Sepsis/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 9/29 tissue of left foot cx: Enterococcus faecalis (pan-sensitive), MSSA (pan-sensitive), Stenotrophomonas maltophilia (pan-sensitive). S/p TEE showing calcific mass on MV leaflet consistent with endocarditis.  Appreciate cardiology and ID.  Please note that he is not a PICC candidate in the setting of ESRD. 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  Continue usual MWF HD - Next HD Monday.   Hypertension/volume. BP controlled, min LE edema, but low Na - UF as tolerated. Anemia. Hgb 10.6. No indication for ESA at this time.  Metabolic bone disease.  CorrCa high sided, VDRA on hold. Phos above goal. Continue home binders/cinacalcet . Nutrition: Alb low, continue supplements.  Renal diet w/fluid restrictions.  T2DM: Insulin  per primary. DVT prophylaxis: Prefer heparin  over Lovenox  in ESRD patient Dispo: SNF recommended by PT Bradycardia - HR to 30s on 10/5. Cardiology consulted. Report Wenckeback conduction.  No additional work up indicated. Recommend K>4 and Mg>2, avoid AV nodal blockers.  Dispo: to [facility] when cleared  Almarie Bonine MD [facility] 11/04/2023,10:13 AM  LOS: 16 days

## 2023-11-04 NOTE — Progress Notes (Signed)
 Pharmacy Antibiotic Note  Edgar Brewer is a 63 y.o. male admitted on 10/19/2023 with endocarditis.  Pharmacy has been consulted for Ceftazidime dosing.  Plan: Start Fortaz 1g IV qHD (MWF); Discontinue Levaquin 500 mg Continue Vancomycin  1 g qHD (MWF)  Continue to follow HD schedule/duration End of therapy 11/19  Height: 5' 8 (172.7 cm) Weight: 91.2 kg (201 lb 1 oz) IBW/kg (Calculated) : 68.4  Temp (24hrs), Avg:98.2 F (36.8 C), Min:97.7 F (36.5 C), Max:98.6 F (37 C)  Recent Labs  Lab 10/28/23 1542 10/29/23 0310 10/30/23 0535 10/30/23 0900 11/01/23 0205 11/01/23 2002 11/01/23 2203 11/02/23 0220 11/02/23 0835 11/03/23 0224 11/04/23 0234  WBC 11.5*  --   --  11.9* 10.1 20.8*  --   --  14.4* 10.0  --   CREATININE 2.57*   < > 13.90*  --  12.73* 8.01*  --  8.96*  --  11.40*  --   LATICACIDVEN 0.9  --   --   --   --  1.5 1.0  --   --   --   --   VANCORANDOM  --   --   --   --   --   --   --  43  --   --  25   < > = values in this interval not displayed.    Estimated Creatinine Clearance: 7.2 mL/min (A) (by C-G formula based on SCr of 11.4 mg/dL (H)).    Allergies  Allergen Reactions   Codeine Anaphylaxis, Hives, Swelling and Other (See Comments)    Swelling all over body and the throat   Clindamycin /Lincomycin Nausea And Vomiting    Antimicrobials this admission: 9/25 cefepime  >> 10/2 9/25 metronidazole  >> 10/2 9/25 vancomycin  >> 10/3  10/3 amoxicillin /clavulanate >> 10/7 10/8 levofloxacin >> 10/11 10/8 vancomycin  >>  10/11 Fortaz >>  Microbiology results: 9/25 BCx: NGTD x5 days 9/29 tissue of left foot cx: Enterococcus faecalis (pan-sensitive), MSSA (pan-sensitive), Stenotrophomonas maltophilia (pan-sensitive)  Thank you for allowing pharmacy to be a part of this patient's care.  R. Samual Satterfield, PharmD PGY-1 Acute Care Pharmacy Resident [name] Please refer to [facility] for [facility] Pharmacy numbers 11/04/2023 2:11 PM

## 2023-11-04 NOTE — Progress Notes (Signed)
 PROGRESS NOTE  Edgar Brewer  FMW:[id number] DOB: 10-02-1960 DOA: 10/19/2023 PCP: Katrinka Garnette KIDD, MD   Brief Narrative: As per prior documentation patient is a 49 male with history of ESRD on dialysis on Monday, Wednesday, Friday, HFpEF, diabetes type 2, prior CVA who was recently admitted for amputation of L 2nd toe through proximal phalanx  presented with complaint of purulent discharge in the left second toe, chills.  On presentation he was febrile, tachycardic, tachypneic, requiring 2 L of oxygen.  Labs with potassium 5.2, creatinine 5.7, lactic acidosis 5.6.XR L foot w/o acute findings or OM .  Patient was admitted for further management of sepsis secondary to left foot wound infection.  Started on broad spectrum antibiotics.  Podiatry, vascular surgery consulted.  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  PT/OT recommending SNF on discharge.  TTE showed 1.1 cm calcified mobile echodensity on the anterior mitral valve leaflet.  TEE confirmed endocarditis.  ID consulted.  Repeat blood culture sent on 10/8.  Waiting for final recommendation from ID regarding antibiotics before discharge to SNF.  TOC following  11/03/2023: Patient seen on hemodialysis.  No new complaints.  Patient is on IV Levaquin and vancomycin .  Wound cultures grew stenotrophomonas maltophilia.  There is also documentation of growth of Enterococcus faecalis and MSSA.  Infectious disease consult is appreciated.  Infectious disease is directing antibiotics management.  As previously documented, TEE revealed endocarditis.  11/04/2023: Patient seen alongside patient's brother.  No new complaints.  Input from infectious disease appreciated.  Patient will be discharged on vancomycin  and ceftaz.  Pursue disposition.  SNF is recommended.  No new complaints today.  Assessment & Plan:  Principal Problem:   Sepsis (HCC) Active Problems:   Diabetes mellitus type 2 with complications (HCC)   ESRD on dialysis (HCC)    Gangrene of left foot (HCC)   Pyogenic inflammation of bone (HCC)   Neuropathy   PAD (peripheral artery disease)   Bradycardia   PAF (paroxysmal atrial fibrillation) (HCC)   Abnormal echocardiogram   Bacterial endocarditis   Endocarditis of mitral valve  Sepsis secondary to surgical wound infection of the left foot:  -Recent history of amputation of L 2nd toe through proximal phalanx.  Presented with fever, chills, tachycardia, tachypnea, lactic acidosis.Started on broad spectrum antibiotics.  Podiatry, vascular surgery consulted.  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  Podiatry recommended nonweightbearing on postop shoe left lower extremity for 2 weeks. Culture showed mixed organisms. Completed the course of  Augmentin .  11/03/2023: Infectious diseases directing antibiotics management.  Patient is currently on IV vancomycin  and Levaquin. 11/04/2023: The source is controlled.  Patient has undergone surgery.  Bradycardia/secondary heart block/infective endocarditis: Became bradycardia in the afternoon of 10/4.  Did not require atropine or temporary pacemaker.  Heart rate spontaneously stabilized.  Blood pressure remained stable.  Cardiology consulted.  TSH normal.  EKG shows second-degree heart block, Mobitz type I/Wenckebach.  Heart rate remains stable now .   Avoid AV nodal blocking agents.Echo showed EF of 60 to 65%, normal right ventricular function but also showed  1.1 x 1.5 calcified mobile echodensity attached to the base and medial aspect (A1) of the anterior MV leaflet. TEE confirmed endocarditis.  ID consulted.  His previous blood cultures have been negative.Repeat blood culture sent on 10/8.  Waiting for final recommendation from ID regarding antibiotics before discharge to SNF.  Plan for 6 weeks of antibiotics. Became febrile and hypotensive in the evening of 10/8, found to  have elevated white cell count ,now improving.  This morning he is afebrile, blood pressure  stable.  On room air.  11/04/2023: Infectious disease has recommended IV vancomycin  and ceftazidime for at least 6 weeks.  Type 2 diabetes:  -Continue current insulin  regimen.  Diabetic coordinator following  ESRD on dialysis/metabolic bone disease/hyperkalemia:  -Nephrology following for dialysis.  Continue Renvela , Sensipar .  Dialyzed on schedule. 11/03/2023: Seen on hemodialysis today. 11/04/2023: Continue hemodialysis as per nephrology team.  Neuropathy: Gabapentin   Peripheral artery disease: Continue Crestor .  Follows-up with vascular surgery as an outpatient.  Obesity: BMI of 32  Constipation: Continue bowel regimen  Deconditioning/debility: PT recommended SNF on discharge.  Waiting for bed availability.  TOC following       DVT prophylaxis:heparin  injection 5,000 Units Start: 10/26/23 0915     Code Status: Full Code  Family Communication: Mother at bedside on 10/8  Patient status:Inpatient  Patient is from :Home  Anticipated discharge to:SNF  Estimated DC date:after ID clearance   Consultants: Podiatry,vascular surgery  Procedures:transmetatarsal amputation of left foot ,TEE  Antimicrobials:  Anti-infectives (From admission, onward)    Start     Dose/Rate Route Frequency Ordered Stop   11/06/23 1600  cefTAZidime (FORTAZ) 1 g in sodium chloride  0.9 % 100 mL IVPB        1 g 200 mL/hr over 30 Minutes Intravenous Once per day on Monday Wednesday Friday 11/04/23 1414 11/13/23 1559   11/03/23 1830  levofloxacin (LEVAQUIN) IVPB 500 mg  Status:  Discontinued       Placed in Followed by Linked Group   500 mg 100 mL/hr over 60 Minutes Intravenous Every 48 hours 11/01/23 1617 11/04/23 1336   11/03/23 1200  vancomycin  (VANCOCIN ) IVPB 1000 mg/200 mL premix  Status:  Discontinued        1,000 mg 200 mL/hr over 60 Minutes Intravenous Every M-W-F (Hemodialysis) 11/01/23 1617 11/02/23 0731   11/02/23 0731  vancomycin  variable dose per unstable renal function  (pharmacist dosing)         Does not apply See admin instructions 11/02/23 0731     11/01/23 1830  vancomycin  (VANCOREADY) IVPB 2000 mg/400 mL        2,000 mg 200 mL/hr over 120 Minutes Intravenous Once 11/01/23 1608 11/01/23 2222   11/01/23 1830  levofloxacin (LEVAQUIN) IVPB 750 mg       Placed in Followed by Linked Group   750 mg 100 mL/hr over 90 Minutes Intravenous  Once 11/01/23 1617 11/01/23 2130   10/27/23 2200  amoxicillin -clavulanate (AUGMENTIN ) 500-125 MG per tablet 1 tablet        1 tablet Oral Daily at bedtime 10/27/23 0849 10/31/23 2203   10/27/23 1000  amoxicillin -clavulanate (AUGMENTIN ) 500-125 MG per tablet 1 tablet  Status:  Discontinued        1 tablet Oral Every 12 hours 10/26/23 2155 10/27/23 0849   10/26/23 2245  amoxicillin -clavulanate (AUGMENTIN ) 500-125 MG per tablet 1 tablet  Status:  Discontinued        1 tablet Oral Every 12 hours 10/26/23 2154 10/26/23 2155   10/23/23 1800  vancomycin  (VANCOCIN ) IVPB 1000 mg/200 mL premix  Status:  Discontinued        1,000 mg 200 mL/hr over 60 Minutes Intravenous Every M-W-F (Hemodialysis) 10/23/23 1457 10/27/23 0846   10/20/23 1800  ceFEPIme  (MAXIPIME ) 1 g in sodium chloride  0.9 % 100 mL IVPB  Status:  Discontinued        1 g 200 mL/hr over 30 Minutes Intravenous  Every 24 hours 10/19/23 1311 10/26/23 2154   10/20/23 1200  vancomycin  (VANCOCIN ) IVPB 1000 mg/200 mL premix  Status:  Discontinued        1,000 mg 200 mL/hr over 60 Minutes Intravenous Every M-W-F (Hemodialysis) 10/20/23 0725 10/23/23 1457   10/19/23 2200  metroNIDAZOLE  (FLAGYL ) tablet 500 mg  Status:  Discontinued        500 mg Oral Every 12 hours 10/19/23 1235 10/26/23 2154   10/19/23 1310  vancomycin  variable dose per unstable renal function (pharmacist dosing)  Status:  Discontinued         Does not apply See admin instructions 10/19/23 1311 10/20/23 0725   10/19/23 0845  ceFEPIme  (MAXIPIME ) 2 g in sodium chloride  0.9 % 100 mL IVPB        2 g 200 mL/hr over  30 Minutes Intravenous  Once 10/19/23 0839 10/19/23 1347   10/19/23 0845  metroNIDAZOLE  (FLAGYL ) IVPB 500 mg        500 mg 100 mL/hr over 60 Minutes Intravenous  Once 10/19/23 0839 10/19/23 1519   10/19/23 0845  vancomycin  (VANCOCIN ) IVPB 1000 mg/200 mL premix  Status:  Discontinued        1,000 mg 200 mL/hr over 60 Minutes Intravenous  Once 10/19/23 0839 10/19/23 0839   10/19/23 0845  vancomycin  (VANCOREADY) IVPB 2000 mg/400 mL        2,000 mg 200 mL/hr over 120 Minutes Intravenous STAT 10/19/23 0839 10/19/23 1207       Subjective: No new complaints.  Objective: Vitals:   11/04/23 0425 11/04/23 0445 11/04/23 0817 11/04/23 1617  BP: (!) 94/54 (!) 101/50 (!) 95/49 (!) 95/55  Pulse: 77 73 76 83  Resp: (!) 22 20 20 14   Temp: 97.7 F (36.5 C)  97.8 F (36.6 C) 98.3 F (36.8 C)  TempSrc: Oral  Oral Oral  SpO2: 92%  97% 92%  Weight:      Height:        Intake/Output Summary (Last 24 hours) at 11/04/2023 1701 Last data filed at 11/04/2023 1000 Gross per 24 hour  Intake 0 ml  Output 0 ml  Net 0 ml   Filed Weights   11/02/23 0600 11/03/23 0727 11/03/23 1112  Weight: 92.3 kg 92.4 kg 91.2 kg    Examination:  General exam: Patient is obese.  Not in any distress.  Seen in hemodialysis.   HEENT: Patient is pale. Respiratory system:  no wheezes or crackles  Cardiovascular system: S1 & S2 heard.  Gastrointestinal system: Abdomen is soft and nontender. Central nervous system: Alert and oriented Extremities: Status post amputation of left forefoot.   Data Reviewed: I have personally reviewed following labs and imaging studies  CBC: Recent Labs  Lab 10/30/23 0900 11/01/23 0205 11/01/23 2002 11/02/23 0835 11/03/23 0224  WBC 11.9* 10.1 20.8* 14.4* 10.0  NEUTROABS  --  8.0* 19.0*  --   --   HGB 9.9* 9.7* 11.3* 11.0* 10.0*  HCT 30.6* 30.1* 35.7* 34.1* 30.6*  MCV 95.6 95.9 97.0 96.9 95.6  PLT 271 281 332 313 281   Basic Metabolic Panel: Recent Labs  Lab  10/30/23 0535 11/01/23 0205 11/01/23 2002 11/02/23 0220 11/03/23 0224  NA 132* 134* 134* 135 135  K 5.6* 5.5* 4.5 4.8 4.9  CL 91* 93* 90* 96* 92*  CO2 24 24 26 23 24   GLUCOSE 116* 224* 133* 101* 126*  BUN 71* 66* 33* 37* 57*  CREATININE 13.90* 12.73* 8.01* 8.96* 11.40*  CALCIUM  9.4 9.2 9.7 9.1  9.4  MG 2.4  --   --   --   --   PHOS  --  7.9*  --   --   --      Recent Results (from the past 240 hours)  Culture, blood (Routine X 2) w Reflex to ID Panel     Status: None (Preliminary result)   Collection Time: 11/01/23  7:48 PM   Specimen: BLOOD  Result Value Ref Range Status   Specimen Description BLOOD SITE NOT SPECIFIED  Final   Special Requests   Final    BOTTLES DRAWN AEROBIC AND ANAEROBIC Blood Culture results may not be optimal due to an inadequate volume of blood received in culture bottles   Culture   Final    NO GROWTH 3 DAYS Performed at [facility], [address], [place], [place] [address]    Report Status PENDING  Incomplete  Culture, blood (Routine X 2) w Reflex to ID Panel     Status: None (Preliminary result)   Collection Time: 11/01/23  7:57 PM   Specimen: BLOOD  Result Value Ref Range Status   Specimen Description BLOOD SITE NOT SPECIFIED  Final   Special Requests   Final    BOTTLES DRAWN AEROBIC AND ANAEROBIC Blood Culture adequate volume   Culture   Final    NO GROWTH 3 DAYS Performed at [facility], [address], [place], [place] [address]    Report Status PENDING  Incomplete     Radiology Studies: No results found.   Scheduled Meds:  (feeding supplement) PROSource Plus  30 mL Oral BID BM   aspirin  EC  81 mg Oral Daily   Chlorhexidine  Gluconate Cloth  6 each Topical Q0600   cinacalcet   60 mg Oral Q T,Th,Sat-1800   [START ON 11/10/2023] darbepoetin (ARANESP ) injection - DIALYSIS  60 mcg Subcutaneous Q Fri-1800   gabapentin   100 mg Oral TID   heparin  injection (subcutaneous)  5,000 Units Subcutaneous Q8H   insulin  aspart  0-6  Units Subcutaneous TID WC   insulin  aspart  4 Units Subcutaneous TID WC   insulin  glargine  50 Units Subcutaneous Daily   multivitamin  1 tablet Oral QHS   mupirocin  cream   Topical QODAY   pantoprazole   40 mg Oral Daily   polyethylene glycol  17 g Oral Daily   rosuvastatin   10 mg Oral Daily   senna-docusate  1 tablet Oral BID   sevelamer  carbonate  1,600 mg Oral TID WC   vancomycin  variable dose per unstable renal function (pharmacist dosing)   Does not apply See admin instructions   Continuous Infusions:  [START ON 11/06/2023] cefTAZidime (FORTAZ)  IV         LOS: 16 days   Leatrice LILLETTE Chapel, MD [facility] P10/11/2023, 5:01 PM

## 2023-11-05 DIAGNOSIS — I96 Gangrene, not elsewhere classified: Secondary | ICD-10-CM | POA: Diagnosis not present

## 2023-11-05 LAB — GLUCOSE, CAPILLARY
Glucose-Capillary: 177 mg/dL — ABNORMAL HIGH (ref 70–99)
Glucose-Capillary: 180 mg/dL — ABNORMAL HIGH (ref 70–99)
Glucose-Capillary: 195 mg/dL — ABNORMAL HIGH (ref 70–99)

## 2023-11-05 LAB — AEROBIC/ANAEROBIC CULTURE W GRAM STAIN (SURGICAL/DEEP WOUND): Gram Stain: NONE SEEN

## 2023-11-05 NOTE — Progress Notes (Signed)
   Progress Note  Patient Name: Edgar Brewer Date of Encounter: 11/05/2023  Primary Cardiologist: Maude Emmer, MD  Interval Summary  Following in background.  Telemetry shows no significant bradycardia or second-degree type I heart block as observed previously.  He continues on antibiotics for treatment of mitral valve endocarditis and osteomyelitis of left foot per ID team.  Mitral regurgitation mild by TEE on October 8.  Plan is 6-week course of antibiotics and would then repeat echocardiogram.  For questions or updates, please contact [facility] Please consult [email or web address] for contact info under   Signed, Jayson Sierras, MD  11/05/2023, 9:06 AM

## 2023-11-05 NOTE — Progress Notes (Signed)
 PROGRESS NOTE  Edgar Brewer  FMW:[id number] DOB: [birth year] DOA: 10/19/2023 PCP: Katrinka Garnette KIDD, MD   Brief Narrative: As per prior documentation patient is a 37 male with history of ESRD on dialysis on Monday, Wednesday, Friday, HFpEF, diabetes type 2, prior CVA who was recently admitted for amputation of L 2nd toe through proximal phalanx  presented with complaint of purulent discharge in the left second toe, chills.  On presentation he was febrile, tachycardic, tachypneic, requiring 2 L of oxygen.  Labs with potassium 5.2, creatinine 5.7, lactic acidosis 5.6.XR L foot w/o acute findings or OM .  Patient was admitted for further management of sepsis secondary to left foot wound infection.  Started on broad spectrum antibiotics.  Podiatry, vascular surgery consulted.  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  PT/OT recommending SNF on discharge.  TTE showed 1.1 cm calcified mobile echodensity on the anterior mitral valve leaflet.  TEE confirmed endocarditis.  ID consulted.  Repeat blood culture sent on 10/8.  Waiting for final recommendation from ID regarding antibiotics before discharge to SNF.  TOC following  11/03/2023: Patient seen on hemodialysis.  No new complaints.  Patient is on IV Levaquin and vancomycin .  Wound cultures grew stenotrophomonas maltophilia.  There is also documentation of growth of Enterococcus faecalis and MSSA.  Infectious disease consult is appreciated.  Infectious disease is directing antibiotics management.  As previously documented, TEE revealed endocarditis.  11/04/2023: Patient seen alongside patient's brother.  No new complaints.  Input from infectious disease appreciated.  Patient will be discharged on vancomycin  and ceftaz.  Pursue disposition.  SNF is recommended.  No new complaints today.  11/05/2023: No new complaints.  Pursue disposition tomorrow after hemodialysis.  Patient will be discharged on vancomycin  and ceftazidime.  No new  complaints today.  Assessment & Plan:  Principal Problem:   Sepsis (HCC) Active Problems:   Diabetes mellitus type 2 with complications (HCC)   ESRD on dialysis (HCC)   Gangrene of left foot (HCC)   Pyogenic inflammation of bone (HCC)   Neuropathy   PAD (peripheral artery disease)   Bradycardia   PAF (paroxysmal atrial fibrillation) (HCC)   Abnormal echocardiogram   Bacterial endocarditis   Endocarditis of mitral valve  Sepsis secondary to surgical wound infection of the left foot:  -Recent history of amputation of L 2nd toe through proximal phalanx.  Presented with fever, chills, tachycardia, tachypnea, lactic acidosis.Started on broad spectrum antibiotics.  Podiatry, vascular surgery consulted.  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  Podiatry recommended nonweightbearing on postop shoe left lower extremity for 2 weeks. Culture showed mixed organisms. Completed the course of  Augmentin .  11/03/2023: Infectious diseases directing antibiotics management.  Patient is currently on IV vancomycin  and Levaquin. 11/04/2023: The source is controlled.  Patient has undergone surgery.  Bradycardia/secondary heart block/infective endocarditis: Became bradycardia in the afternoon of 10/4.  Did not require atropine or temporary pacemaker.  Heart rate spontaneously stabilized.  Blood pressure remained stable.  Cardiology consulted.  TSH normal.  EKG shows second-degree heart block, Mobitz type I/Wenckebach.  Heart rate remains stable now .   Avoid AV nodal blocking agents.Echo showed EF of 60 to 65%, normal right ventricular function but also showed  1.1 x 1.5 calcified mobile echodensity attached to the base and medial aspect (A1) of the anterior MV leaflet. TEE confirmed endocarditis.  ID consulted.  His previous blood cultures have been negative.Repeat blood culture sent on 10/8.  Waiting for final recommendation from  ID regarding antibiotics before discharge to SNF.  Plan for 6  weeks of antibiotics. Became febrile and hypotensive in the evening of 10/8, found to have elevated white cell count ,now improving.  This morning he is afebrile, blood pressure stable.  On room air.  11/04/2023: Infectious disease has recommended IV vancomycin  and ceftazidime for at least 6 weeks.  Type 2 diabetes:  -Continue current insulin  regimen.  Diabetic coordinator following  ESRD on dialysis/metabolic bone disease/hyperkalemia:  -Nephrology following for dialysis.  Continue Renvela , Sensipar .  Dialyzed on schedule. 11/03/2023: Seen on hemodialysis today. 11/04/2023: Continue hemodialysis as per nephrology team.  Neuropathy: Gabapentin   Peripheral artery disease: Continue Crestor .  Follows-up with vascular surgery as an outpatient.  Obesity: BMI of 32  Constipation: Continue bowel regimen  Deconditioning/debility: PT recommended SNF on discharge.  Waiting for bed availability.  TOC following       DVT prophylaxis:heparin  injection 5,000 Units Start: 10/26/23 0915     Code Status: Full Code  Family Communication: Mother at bedside on 10/8  Patient status:Inpatient  Patient is from :Home  Anticipated discharge to:SNF  Estimated DC date:after ID clearance   Consultants: Podiatry,vascular surgery  Procedures:transmetatarsal amputation of left foot ,TEE  Antimicrobials:  Anti-infectives (From admission, onward)    Start     Dose/Rate Route Frequency Ordered Stop   11/06/23 1600  cefTAZidime (FORTAZ) 1 g in sodium chloride  0.9 % 100 mL IVPB        1 g 200 mL/hr over 30 Minutes Intravenous Once per day on Monday Wednesday Friday 11/04/23 1414 11/13/23 1559   11/03/23 1830  levofloxacin (LEVAQUIN) IVPB 500 mg  Status:  Discontinued       Placed in Followed by Linked Group   500 mg 100 mL/hr over 60 Minutes Intravenous Every 48 hours 11/01/23 1617 11/04/23 1336   11/03/23 1200  vancomycin  (VANCOCIN ) IVPB 1000 mg/200 mL premix  Status:  Discontinued         1,000 mg 200 mL/hr over 60 Minutes Intravenous Every M-W-F (Hemodialysis) 11/01/23 1617 11/02/23 0731   11/02/23 0731  vancomycin  variable dose per unstable renal function (pharmacist dosing)         Does not apply See admin instructions 11/02/23 0731     11/01/23 1830  vancomycin  (VANCOREADY) IVPB 2000 mg/400 mL        2,000 mg 200 mL/hr over 120 Minutes Intravenous Once 11/01/23 1608 11/01/23 2222   11/01/23 1830  levofloxacin (LEVAQUIN) IVPB 750 mg       Placed in Followed by Linked Group   750 mg 100 mL/hr over 90 Minutes Intravenous  Once 11/01/23 1617 11/01/23 2130   10/27/23 2200  amoxicillin -clavulanate (AUGMENTIN ) 500-125 MG per tablet 1 tablet        1 tablet Oral Daily at bedtime 10/27/23 0849 10/31/23 2203   10/27/23 1000  amoxicillin -clavulanate (AUGMENTIN ) 500-125 MG per tablet 1 tablet  Status:  Discontinued        1 tablet Oral Every 12 hours 10/26/23 2155 10/27/23 0849   10/26/23 2245  amoxicillin -clavulanate (AUGMENTIN ) 500-125 MG per tablet 1 tablet  Status:  Discontinued        1 tablet Oral Every 12 hours 10/26/23 2154 10/26/23 2155   10/23/23 1800  vancomycin  (VANCOCIN ) IVPB 1000 mg/200 mL premix  Status:  Discontinued        1,000 mg 200 mL/hr over 60 Minutes Intravenous Every M-W-F (Hemodialysis) 10/23/23 1457 10/27/23 0846   10/20/23 1800  ceFEPIme  (MAXIPIME ) 1 g in sodium  chloride 0.9 % 100 mL IVPB  Status:  Discontinued        1 g 200 mL/hr over 30 Minutes Intravenous Every 24 hours 10/19/23 1311 10/26/23 2154   10/20/23 1200  vancomycin  (VANCOCIN ) IVPB 1000 mg/200 mL premix  Status:  Discontinued        1,000 mg 200 mL/hr over 60 Minutes Intravenous Every M-W-F (Hemodialysis) 10/20/23 0725 10/23/23 1457   10/19/23 2200  metroNIDAZOLE  (FLAGYL ) tablet 500 mg  Status:  Discontinued        500 mg Oral Every 12 hours 10/19/23 1235 10/26/23 2154   10/19/23 1310  vancomycin  variable dose per unstable renal function (pharmacist dosing)  Status:  Discontinued          Does not apply See admin instructions 10/19/23 1311 10/20/23 0725   10/19/23 0845  ceFEPIme  (MAXIPIME ) 2 g in sodium chloride  0.9 % 100 mL IVPB        2 g 200 mL/hr over 30 Minutes Intravenous  Once 10/19/23 0839 10/19/23 1347   10/19/23 0845  metroNIDAZOLE  (FLAGYL ) IVPB 500 mg        500 mg 100 mL/hr over 60 Minutes Intravenous  Once 10/19/23 0839 10/19/23 1519   10/19/23 0845  vancomycin  (VANCOCIN ) IVPB 1000 mg/200 mL premix  Status:  Discontinued        1,000 mg 200 mL/hr over 60 Minutes Intravenous  Once 10/19/23 0839 10/19/23 0839   10/19/23 0845  vancomycin  (VANCOREADY) IVPB 2000 mg/400 mL        2,000 mg 200 mL/hr over 120 Minutes Intravenous STAT 10/19/23 0839 10/19/23 1207       Subjective: No new complaints.  Objective: Vitals:   11/04/23 2356 11/05/23 0530 11/05/23 0700 11/05/23 1157  BP: (!) 109/50 118/61 106/62 105/65  Pulse: 80 86 87 83  Resp: 16 19 18  (!) 21  Temp: 98.3 F (36.8 C) 98.5 F (36.9 C) 98.5 F (36.9 C) 98.5 F (36.9 C)  TempSrc: Oral Oral Oral Oral  SpO2: 96% 95% 96% 97%  Weight:      Height:        Intake/Output Summary (Last 24 hours) at 11/05/2023 1508 Last data filed at 11/05/2023 1300 Gross per 24 hour  Intake 720 ml  Output 300 ml  Net 420 ml   Filed Weights   11/02/23 0600 11/03/23 0727 11/03/23 1112  Weight: 92.3 kg 92.4 kg 91.2 kg    Examination:  General exam: Patient is obese.  Not in any distress.  Seen in hemodialysis.   HEENT: Patient is pale. Respiratory system:  no wheezes or crackles  Cardiovascular system: S1 & S2 heard.  Gastrointestinal system: Abdomen is soft and nontender. Central nervous system: Alert and oriented Extremities: Status post amputation of left forefoot.   Data Reviewed: I have personally reviewed following labs and imaging studies  CBC: Recent Labs  Lab 10/30/23 0900 11/01/23 0205 11/01/23 2002 11/02/23 0835 11/03/23 0224  WBC 11.9* 10.1 20.8* 14.4* 10.0  NEUTROABS  --  8.0* 19.0*   --   --   HGB 9.9* 9.7* 11.3* 11.0* 10.0*  HCT 30.6* 30.1* 35.7* 34.1* 30.6*  MCV 95.6 95.9 97.0 96.9 95.6  PLT 271 281 332 313 281   Basic Metabolic Panel: Recent Labs  Lab 10/30/23 0535 11/01/23 0205 11/01/23 2002 11/02/23 0220 11/03/23 0224  NA 132* 134* 134* 135 135  K 5.6* 5.5* 4.5 4.8 4.9  CL 91* 93* 90* 96* 92*  CO2 24 24 26 23 24   GLUCOSE  116* 224* 133* 101* 126*  BUN 71* 66* 33* 37* 57*  CREATININE 13.90* 12.73* 8.01* 8.96* 11.40*  CALCIUM  9.4 9.2 9.7 9.1 9.4  MG 2.4  --   --   --   --   PHOS  --  7.9*  --   --   --      Recent Results (from the past 240 hours)  Culture, blood (Routine X 2) w Reflex to ID Panel     Status: None (Preliminary result)   Collection Time: 11/01/23  7:48 PM   Specimen: BLOOD  Result Value Ref Range Status   Specimen Description BLOOD SITE NOT SPECIFIED  Final   Special Requests   Final    BOTTLES DRAWN AEROBIC AND ANAEROBIC Blood Culture results may not be optimal due to an inadequate volume of blood received in culture bottles   Culture   Final    NO GROWTH 4 DAYS Performed at [facility], [address], [place], [place] [address]    Report Status PENDING  Incomplete  Culture, blood (Routine X 2) w Reflex to ID Panel     Status: None (Preliminary result)   Collection Time: 11/01/23  7:57 PM   Specimen: BLOOD  Result Value Ref Range Status   Specimen Description BLOOD SITE NOT SPECIFIED  Final   Special Requests   Final    BOTTLES DRAWN AEROBIC AND ANAEROBIC Blood Culture adequate volume   Culture   Final    NO GROWTH 4 DAYS Performed at [facility], [address] [place], [place] [address]    Report Status PENDING  Incomplete     Radiology Studies: No results found.   Scheduled Meds:  (feeding supplement) PROSource Plus  30 mL Oral BID BM   aspirin  EC  81 mg Oral Daily   Chlorhexidine  Gluconate Cloth  6 each Topical Q0600   cinacalcet   60 mg Oral Q T,Th,Sat-1800   [START ON 11/10/2023] darbepoetin  (ARANESP ) injection - DIALYSIS  60 mcg Subcutaneous Q Fri-1800   gabapentin   100 mg Oral TID   heparin  injection (subcutaneous)  5,000 Units Subcutaneous Q8H   insulin  aspart  0-6 Units Subcutaneous TID WC   insulin  aspart  4 Units Subcutaneous TID WC   insulin  glargine  50 Units Subcutaneous Daily   multivitamin  1 tablet Oral QHS   mupirocin  cream   Topical QODAY   pantoprazole   40 mg Oral Daily   polyethylene glycol  17 g Oral Daily   rosuvastatin   10 mg Oral Daily   senna-docusate  1 tablet Oral BID   sevelamer  carbonate  1,600 mg Oral TID WC   vancomycin  variable dose per unstable renal function (pharmacist dosing)   Does not apply See admin instructions   Continuous Infusions:  [START ON 11/06/2023] cefTAZidime (FORTAZ)  IV         LOS: 17 days   Leatrice LILLETTE Chapel, MD [facility] P10/12/2023, 3:08 PM

## 2023-11-05 NOTE — Plan of Care (Signed)
  Problem: Education: Goal: Ability to describe self-care measures that may prevent or decrease complications (Diabetes Survival Skills Education) will improve Outcome: Progressing Goal: Individualized Educational Video(s) Outcome: Progressing   Problem: Coping: Goal: Ability to adjust to condition or change in health will improve Outcome: Progressing   Problem: Fluid Volume: Goal: Ability to maintain a balanced intake and output will improve Outcome: Progressing   Problem: Health Behavior/Discharge Planning: Goal: Ability to identify and utilize available resources and services will improve Outcome: Progressing Goal: Ability to manage health-related needs will improve Outcome: Progressing   Problem: Metabolic: Goal: Ability to maintain appropriate glucose levels will improve Outcome: Progressing   Problem: Nutritional: Goal: Maintenance of adequate nutrition will improve Outcome: Progressing Goal: Progress toward achieving an optimal weight will improve Outcome: Progressing   Problem: Skin Integrity: Goal: Risk for impaired skin integrity will decrease Outcome: Progressing   Problem: Tissue Perfusion: Goal: Adequacy of tissue perfusion will improve Outcome: Progressing   Problem: Education: Goal: Knowledge of General Education information will improve Description: Including pain rating scale, medication(s)/side effects and non-pharmacologic comfort measures Outcome: Progressing   Problem: Health Behavior/Discharge Planning: Goal: Ability to manage health-related needs will improve Outcome: Progressing   Problem: Clinical Measurements: Goal: Ability to maintain clinical measurements within normal limits will improve Outcome: Progressing Goal: Will remain free from infection Outcome: Progressing Goal: Diagnostic test results will improve Outcome: Progressing Goal: Respiratory complications will improve Outcome: Progressing Goal: Cardiovascular complication will  be avoided Outcome: Progressing   Problem: Activity: Goal: Risk for activity intolerance will decrease Outcome: Progressing   Problem: Nutrition: Goal: Adequate nutrition will be maintained Outcome: Progressing   Problem: Coping: Goal: Level of anxiety will decrease Outcome: Progressing   Problem: Elimination: Goal: Will not experience complications related to bowel motility Outcome: Progressing   Problem: Pain Managment: Goal: General experience of comfort will improve and/or be controlled Outcome: Progressing   Problem: Safety: Goal: Ability to remain free from injury will improve Outcome: Progressing   Problem: Skin Integrity: Goal: Risk for impaired skin integrity will decrease Outcome: Progressing

## 2023-11-05 NOTE — Progress Notes (Signed)
 [facility] Progress Note   Subjective:    Seen and examined.  No big issues today.  Hopeful for discharge tomorrow  Objective Vitals:   11/04/23 2356 11/05/23 0530 11/05/23 0700 11/05/23 1157  BP: (!) 109/50 118/61 106/62 105/65  Pulse: 80 86 87 83  Resp: 16 19 18  (!) 21  Temp: 98.3 F (36.8 C) 98.5 F (36.9 C) 98.5 F (36.9 C) 98.5 F (36.9 C)  TempSrc: Oral Oral Oral Oral  SpO2: 96% 95% 96% 97%  Weight:      Height:       Physical Exam General: alert, nad  Heart: RRR Resp: nml WOB on RA Abdomen: non distended Extremities: no LE edema, L foot dressed , some dried blood on dressing, + burnished skin Dialysis Access: LU AVF +bruit   Filed Weights   11/02/23 0600 11/03/23 0727 11/03/23 1112  Weight: 92.3 kg 92.4 kg 91.2 kg    Intake/Output Summary (Last 24 hours) at 11/05/2023 1317 Last data filed at 11/05/2023 0900 Gross per 24 hour  Intake 480 ml  Output --  Net 480 ml     Additional Objective Labs: Basic Metabolic Panel: Recent Labs  Lab 11/01/23 0205 11/01/23 2002 11/02/23 0220 11/03/23 0224  NA 134* 134* 135 135  K 5.5* 4.5 4.8 4.9  CL 93* 90* 96* 92*  CO2 24 26 23 24   GLUCOSE 224* 133* 101* 126*  BUN 66* 33* 37* 57*  CREATININE 12.73* 8.01* 8.96* 11.40*  CALCIUM  9.2 9.7 9.1 9.4  PHOS 7.9*  --   --   --    Liver Function Tests: Recent Labs  Lab 11/01/23 0205 11/01/23 2002  AST  --  21  ALT  --  16  ALKPHOS  --  248*  BILITOT  --  1.1  PROT  --  8.3*  ALBUMIN 2.5* 2.9*   No results for input(s): LIPASE, AMYLASE in the last 168 hours. CBC: Recent Labs  Lab 10/30/23 0900 11/01/23 0205 11/01/23 2002 11/02/23 0835 11/03/23 0224  WBC 11.9* 10.1 20.8* 14.4* 10.0  NEUTROABS  --  8.0* 19.0*  --   --   HGB 9.9* 9.7* 11.3* 11.0* 10.0*  HCT 30.6* 30.1* 35.7* 34.1* 30.6*  MCV 95.6 95.9 97.0 96.9 95.6  PLT 271 281 332 313 281   Blood Culture    Component Value Date/Time   SDES BLOOD SITE NOT SPECIFIED 11/01/2023  1957   SPECREQUEST  11/01/2023 1957    BOTTLES DRAWN AEROBIC AND ANAEROBIC Blood Culture adequate volume   CULT  11/01/2023 1957    NO GROWTH 4 DAYS Performed at [facility], [address] [place], [place] [address]    REPTSTATUS PENDING 11/01/2023 1957    Cardiac Enzymes: No results for input(s): CKTOTAL, CKMB, CKMBINDEX, TROPONINI in the last 168 hours. CBG: Recent Labs  Lab 11/04/23 1112 11/04/23 1613 11/04/23 2126 11/05/23 0533 11/05/23 1100  GLUCAP 205* 216* 117* 177* 180*   Iron Studies: No results for input(s): IRON, TIBC, TRANSFERRIN, FERRITIN in the last 72 hours. Lab Results  Component Value Date   INR 1.1 11/01/2023   INR 1.1 10/19/2023   INR 1.0 11/14/2018   Studies/Results: No results found.   Medications:  [START ON 11/06/2023] cefTAZidime (FORTAZ)  IV      (feeding supplement) PROSource Plus  30 mL Oral BID BM   aspirin  EC  81 mg Oral Daily   Chlorhexidine  Gluconate Cloth  6 each Topical Q0600   cinacalcet   60  mg Oral Q T,Th,Sat-1800   [START ON 11/10/2023] darbepoetin (ARANESP ) injection - DIALYSIS  60 mcg Subcutaneous Q Fri-1800   gabapentin   100 mg Oral TID   heparin  injection (subcutaneous)  5,000 Units Subcutaneous Q8H   insulin  aspart  0-6 Units Subcutaneous TID WC   insulin  aspart  4 Units Subcutaneous TID WC   insulin  glargine  50 Units Subcutaneous Daily   multivitamin  1 tablet Oral QHS   mupirocin  cream   Topical QODAY   pantoprazole   40 mg Oral Daily   polyethylene glycol  17 g Oral Daily   rosuvastatin   10 mg Oral Daily   senna-docusate  1 tablet Oral BID   sevelamer  carbonate  1,600 mg Oral TID WC   vancomycin  variable dose per unstable renal function (pharmacist dosing)   Does not apply See admin instructions    Dialysis Orders: MWF - NW 4hr, 400/A1.5, EDW 96.8kg, 2K/2.5Ca bath, AVF, Heparin  2600 - no ESA, Hgb > goal - Calcitriol 1.75 three times per week  Assessment/Plan: Sepsis/Infected left foot  wound s/p toe amputation. 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 9/29 tissue of left foot cx: Enterococcus faecalis (pan-sensitive), MSSA (pan-sensitive), Stenotrophomonas maltophilia (pan-sensitive). S/p TEE showing calcific mass on MV leaflet consistent with endocarditis.  Appreciate cardiology and ID.  Please note that he is not a PICC candidate in the setting of ESRD. 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  Per ID, will do vanc/ ceftaz, end date of therapy to be 11/19. ESRD.  Continue usual MWF HD - Next HD Monday 10/13.   Hypertension/volume. BP controlled, min LE edema, but low Na - UF as tolerated. Anemia. Hgb 10.6. No indication for ESA at this time.  Metabolic bone disease.  CorrCa high sided, VDRA on hold. Phos above goal. Continue home binders/cinacalcet . Nutrition: Alb low, continue supplements.  Renal diet w/fluid restrictions.  T2DM: Insulin  per primary. DVT prophylaxis: Prefer heparin  over Lovenox  in ESRD patient Dispo: SNF recommended by PT Bradycardia - HR to 30s on 10/5. Cardiology consulted. Report Wenckeback conduction.  No additional work up indicated. Recommend K>4 and Mg>2, avoid AV nodal blockers.  Dispo: to [facility] when cleared  Almarie Bonine MD [facility] 11/05/2023,1:17 PM  LOS: 17 days

## 2023-11-05 NOTE — Progress Notes (Signed)
 Physical Therapy Treatment Patient Details Name: Trystin Terhune MRN: [id number] DOB: 10/09/1960 Today's Date: 11/05/2023   History of Present Illness 63 y.o. M adm 10/19/23 with sepsis. 9/29 Lt transmet amp. 10/8 TEE with endocarditis and respiratory distress. PMHx: ESRD on HD MWF, HFpEF, T2DM, CVA, PAD, PAF, HTN    PT Comments  Pt with fair tolerance to treatment today. Pt with similar presentation to previous session. Pt still nonadherent to WB precautions despite max cues, demonstration, and education. Pt able to perform seated therex on EOB. No change in DC/DME recs at this time. PT will continue to follow.    If plan is discharge home, recommend the following: A lot of help with walking and/or transfers;Help with stairs or ramp for entrance;A lot of help with bathing/dressing/bathroom;Assist for transportation;Assistance with cooking/housework   Can travel by private vehicle     No  Equipment Recommendations  Wheelchair (measurements PT);Wheelchair cushion (measurements PT);Rolling walker (2 wheels)    Recommendations for Other Services       Precautions / Restrictions Precautions Precautions: Fall Recall of Precautions/Restrictions: Impaired Restrictions Weight Bearing Restrictions Per Provider Order: Yes LLE Weight Bearing Per Provider Order: Non weight bearing     Mobility  Bed Mobility               General bed mobility comments: pt sitting EOB on arrival    Transfers Overall transfer level: Needs assistance Equipment used: Rolling walker (2 wheels) Transfers: Sit to/from Stand Sit to Stand: Contact guard assist           General transfer comment: Cues for hand placement. CGA for safety as pt refuses to adhere to WB precautions.    Ambulation/Gait Ambulation/Gait assistance: Min assist, +2 safety/equipment Gait Distance (Feet): 20 Feet Assistive device: Rolling walker (2 wheels) Gait Pattern/deviations: Trunk flexed, Decreased stride length,  Step-through pattern, Antalgic Gait velocity: dec     General Gait Details: Repeated use of demonstration today; noting improved stepping on R foot only, with efforts at hop steps; still touching down and stepping on LLE. Pt states I just cant do that   Stairs             Wheelchair Mobility     Tilt Bed    Modified Rankin (Stroke Patients Only)       Balance Overall balance assessment: Needs assistance Sitting-balance support: No upper extremity supported, Feet supported Sitting balance-Leahy Scale: Fair Sitting balance - Comments: EOB without support   Standing balance support: Bilateral upper extremity supported, Reliant on assistive device for balance Standing balance-Leahy Scale: Poor Standing balance comment: Rw in standing                            Communication Communication Communication: No apparent difficulties  Cognition Arousal: Alert Behavior During Therapy: Flat affect   PT - Cognitive impairments: Problem solving, Sequencing, Awareness, Safety/Judgement, Memory                       PT - Cognition Comments: Short tempered at times, decreased awareness of safety and limitations, pt ignoring cues for NWB LLE. Following commands: Impaired Following commands impaired: Follows one step commands inconsistently, Follows one step commands with increased time    Cueing Cueing Techniques: Verbal cues, Gestural cues  Exercises General Exercises - Upper Extremity Chair Push Up: Both, Strengthening, AROM, 10 reps, Seated    General Comments General comments (skin integrity, edema, etc.): VSS  Pertinent Vitals/Pain Pain Assessment Pain Assessment: No/denies pain    Home Living                          Prior Function            PT Goals (current goals can now be found in the care plan section) Progress towards PT goals: Progressing toward goals    Frequency    Min 2X/week      PT Plan       Co-evaluation              AM-PAC PT 6 Clicks Mobility   Outcome Measure  Help needed turning from your back to your side while in a flat bed without using bedrails?: A Little Help needed moving from lying on your back to sitting on the side of a flat bed without using bedrails?: A Little Help needed moving to and from a bed to a chair (including a wheelchair)?: A Little Help needed standing up from a chair using your arms (e.g., wheelchair or bedside chair)?: A Little Help needed to walk in hospital room?: Total Help needed climbing 3-5 steps with a railing? : Total 6 Click Score: 14    End of Session Equipment Utilized During Treatment: Gait belt Activity Tolerance: Patient tolerated treatment well Patient left: in bed;with call bell/phone within reach Nurse Communication: Mobility status;Precautions;Weight bearing status PT Visit Diagnosis: Unsteadiness on feet (R26.81);Other abnormalities of gait and mobility (R26.89);Muscle weakness (generalized) (M62.81) Pain - Right/Left: Left Pain - part of body: Ankle and joints of foot     Time: 8541-8484 PT Time Calculation (min) (ACUTE ONLY): 17 min  Charges:    $Therapeutic Activity: 8-22 mins PT General Charges $$ ACUTE PT VISIT: 1 Visit                     Sueellen NOVAK, PT, DPT Acute Rehab Services [phone]    Fynn Vanblarcom 11/05/2023, 4:15 PM

## 2023-11-06 DIAGNOSIS — I330 Acute and subacute infective endocarditis: Secondary | ICD-10-CM | POA: Diagnosis not present

## 2023-11-06 LAB — GLUCOSE, CAPILLARY
Glucose-Capillary: 87 mg/dL (ref 70–99)
Glucose-Capillary: 150 mg/dL — ABNORMAL HIGH (ref 70–99)
Glucose-Capillary: 141 mg/dL — ABNORMAL HIGH (ref 70–99)

## 2023-11-06 LAB — RENAL FUNCTION PANEL
Albumin: 2.7 g/dL — ABNORMAL LOW (ref 3.5–5.0)
Anion gap: 19 — ABNORMAL HIGH (ref 5–15)
BUN: 78 mg/dL — ABNORMAL HIGH (ref 8–23)
CO2: 22 mmol/L (ref 22–32)
Calcium: 9.9 mg/dL (ref 8.9–10.3)
Chloride: 89 mmol/L — ABNORMAL LOW (ref 98–111)
Creatinine, Ser: 12.92 mg/dL — ABNORMAL HIGH (ref 0.61–1.24)
GFR, Estimated: 4 mL/min — ABNORMAL LOW (ref >60)
Glucose, Bld: 104 mg/dL — ABNORMAL HIGH (ref 70–99)
Phosphorus: 8.1 mg/dL — ABNORMAL HIGH (ref 2.5–4.6)
Potassium: 5.4 mmol/L — ABNORMAL HIGH (ref 3.5–5.1)
Sodium: 130 mmol/L — ABNORMAL LOW (ref 135–145)

## 2023-11-06 LAB — CULTURE, BLOOD (ROUTINE X 2)
Culture: NO GROWTH
Culture: NO GROWTH
Special Requests: ADEQUATE

## 2023-11-06 LAB — CBC
HCT: 30.6 % — ABNORMAL LOW (ref 39.0–52.0)
Hemoglobin: 10.3 g/dL — ABNORMAL LOW (ref 13.0–17.0)
MCH: 31.3 pg (ref 26.0–34.0)
MCHC: 33.7 g/dL (ref 30.0–36.0)
MCV: 93 fL (ref 80.0–100.0)
Platelets: 280 K/uL (ref 150–400)
RBC: 3.29 MIL/uL — ABNORMAL LOW (ref 4.22–5.81)
RDW: 15.1 % (ref 11.5–15.5)
WBC: 9.1 K/uL (ref 4.0–10.5)
nRBC: 0 % (ref 0.0–0.2)

## 2023-11-06 MED ORDER — HEPARIN SODIUM (PORCINE) 1000 UNIT/ML IJ SOLN
INTRAMUSCULAR | Status: AC
  Filled 2023-11-06: qty 3

## 2023-11-06 MED ORDER — VANCOMYCIN HCL 750 MG/150ML IV SOLN: 750.0000 mg | INTRAVENOUS | Status: DC

## 2023-11-06 MED ORDER — VANCOMYCIN HCL 750 MG/150ML IV SOLN
750.0000 mg | Freq: Once | INTRAVENOUS | Status: AC
  Administered 2023-11-06: 750 mg via INTRAVENOUS
  Filled 2023-11-06: qty 150

## 2023-11-06 MED ORDER — VANCOMYCIN HCL IN DEXTROSE 1-5 GM/200ML-% IV SOLN: 1000.0000 mg | INTRAVENOUS | Status: DC

## 2023-11-06 MED ORDER — VANCOMYCIN HCL IN DEXTROSE 1-5 GM/200ML-% IV SOLN
1000.0000 mg | Freq: Once | INTRAVENOUS | Status: DC
  Filled 2023-11-06: qty 200

## 2023-11-06 MED ORDER — HEPARIN SODIUM (PORCINE) 1000 UNIT/ML IJ SOLN
2600.0000 [IU] | Freq: Once | INTRAMUSCULAR | Status: AC
  Administered 2023-11-06: 2600 [IU] via INTRAVENOUS

## 2023-11-06 NOTE — Progress Notes (Signed)
 PROGRESS NOTE  Edgar Brewer  FMW:[id number] DOB: [birth year] DOA: 10/19/2023 PCP: Katrinka Garnette KIDD, MD   Brief Narrative: Patient is a 63 year old male with past medical history significant for end-stage renal disease on hemodialysis Monday, Wednesday and Friday; HFpEF, diabetes type 2 and prior CVA.  Patient was recently admitted for amputation of L 2nd toe through proximal phalanx.  Patient presented with purulent discharge from the left second toe, as well as, chills.  On presentation he was febrile, tachycardic, tachypneic, requiring 2 L of oxygen.  Labs revealed potassium 5.2, creatinine 5.7, lactic acidosis 5.6.XR L foot w/o acute findings or OM .  Patient was admitted for further management of sepsis secondary to left foot wound infection.  Patient was started on broad spectrum antibiotics.  Podiatry and vascular surgery teams were consulted.  Patient is status post transmetatarsal amputation of left foot on 10/23/2023.  PT/OT recommending SNF on discharge.  TTE showed 1.1 cm calcified mobile echodensity on the anterior mitral valve leaflet.  TEE confirmed endocarditis.  ID consulted.  Blood cultures have been negative.  Infectious disease team has advised 6 weeks of antibiotics, consideration 11/01/2023 as effects of antibiotics, and completion date 12/13/2023.  Patient will be discharged on IV vancomycin  and ceftazidime, to be given with dialysis.    11/06/2023: Patient seen on hemodialysis.  No new complaints.  Assessment & Plan:  Principal Problem:   Sepsis (HCC) Active Problems:   Diabetes mellitus type 2 with complications (HCC)   ESRD on dialysis (HCC)   Gangrene of left foot (HCC)   Pyogenic inflammation of bone (HCC)   Neuropathy   PAD (peripheral artery disease)   Bradycardia   PAF (paroxysmal atrial fibrillation) (HCC)   Abnormal echocardiogram   Bacterial endocarditis   Endocarditis of mitral valve  Sepsis secondary to surgical wound infection of the left foot:  -Recent  history of amputation of L 2nd toe through proximal phalanx.  Presented with fever, chills, tachycardia, tachypnea, lactic acidosis.Started on broad spectrum antibiotics.  Podiatry, vascular surgery consulted.  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  Podiatry recommended nonweightbearing on postop shoe left lower extremity for 2 weeks. Culture showed mixed organisms. Completed the course of  Augmentin .   11/06/2023: Patient be discharged back home on IV vancomycin  and ceftazidime with hemodialysis for 6 weeks (for treatment of culture-negative endocarditis).  Patient has undergone surgery, therefore, source of foot infection has been controlled. The source is controlled.  Patient has undergone surgery.  Bradycardia/secondary heart block/infective endocarditis (culture-negative): - Became bradycardia on 10/28/2023.  Patient did not require atropine or temporary pacemaker. - Heart rate spontaneously stabilized. - TSH normal. - EKG revealed second-degree heart block, Mobitz type I/Wenckebach.  Heart rate remains stable now .    -Avoid AV nodal blocking agents. -Echo showed EF of 60 to 65%, normal right ventricular function but also showed  1.1 x 1.5 calcified mobile echodensity attached to the base and medial aspect (A1) of the anterior MV leaflet. TEE confirmed endocarditis.   -ID consulted.   - Culture negative.   -Plan is for 6 weeks of antibiotics (vancomycin  and ceftazidime).  Type 2 diabetes:  -Continue current insulin  regimen.    ESRD on dialysis/metabolic bone disease/hyperkalemia:  -Nephrology following for dialysis.  Continue Renvela , Sensipar .  Dialyzed on schedule.  Neuropathy: Gabapentin   Peripheral artery disease: Continue Crestor .  Follows-up with vascular surgery as an outpatient.  Obesity: BMI of 32  Constipation: Continue bowel regimen  Deconditioning/debility: PT recommended SNF on discharge.  Waiting for bed availability.  TOC following        DVT prophylaxis:heparin  injection 5,000 Units Start: 10/26/23 0915     Code Status: Full Code  Family Communication: Mother at bedside on 10/8  Patient status:Inpatient  Patient is from :Home  Anticipated discharge to:SNF  Estimated DC date:after ID clearance   Consultants: Podiatry,vascular surgery  Procedures:transmetatarsal amputation of left foot ,TEE  Antimicrobials:  Anti-infectives (From admission, onward)    Start     Dose/Rate Route Frequency Ordered Stop   11/08/23 1200  vancomycin  (VANCOCIN ) IVPB 1000 mg/200 mL premix  Status:  Discontinued        1,000 mg 200 mL/hr over 60 Minutes Intravenous Every M-W-F (Hemodialysis) 11/06/23 1216 11/06/23 1319   11/08/23 1200  vancomycin  (VANCOREADY) IVPB 750 mg/150 mL        750 mg 150 mL/hr over 60 Minutes Intravenous Every M-W-F (Hemodialysis) 11/06/23 1319     11/06/23 1600  cefTAZidime (FORTAZ) 1 g in sodium chloride  0.9 % 100 mL IVPB        1 g 200 mL/hr over 30 Minutes Intravenous Once per day on Monday Wednesday Friday 11/04/23 1414 11/13/23 1559   11/06/23 1415  vancomycin  (VANCOREADY) IVPB 750 mg/150 mL        750 mg 150 mL/hr over 60 Minutes Intravenous  Once 11/06/23 1319 11/06/23 1623   11/06/23 1315  vancomycin  (VANCOCIN ) IVPB 1000 mg/200 mL premix  Status:  Discontinued        1,000 mg 200 mL/hr over 60 Minutes Intravenous  Once 11/06/23 1216 11/06/23 1319   11/03/23 1830  levofloxacin (LEVAQUIN) IVPB 500 mg  Status:  Discontinued       Placed in Followed by Linked Group   500 mg 100 mL/hr over 60 Minutes Intravenous Every 48 hours 11/01/23 1617 11/04/23 1336   11/03/23 1200  vancomycin  (VANCOCIN ) IVPB 1000 mg/200 mL premix  Status:  Discontinued        1,000 mg 200 mL/hr over 60 Minutes Intravenous Every M-W-F (Hemodialysis) 11/01/23 1617 11/02/23 0731   11/02/23 0731  vancomycin  variable dose per unstable renal function (pharmacist dosing)  Status:  Discontinued         Does not apply See admin  instructions 11/02/23 0731 11/06/23 1319   11/01/23 1830  vancomycin  (VANCOREADY) IVPB 2000 mg/400 mL        2,000 mg 200 mL/hr over 120 Minutes Intravenous Once 11/01/23 1608 11/01/23 2222   11/01/23 1830  levofloxacin (LEVAQUIN) IVPB 750 mg       Placed in Followed by Linked Group   750 mg 100 mL/hr over 90 Minutes Intravenous  Once 11/01/23 1617 11/01/23 2130   10/27/23 2200  amoxicillin -clavulanate (AUGMENTIN ) 500-125 MG per tablet 1 tablet        1 tablet Oral Daily at bedtime 10/27/23 0849 10/31/23 2203   10/27/23 1000  amoxicillin -clavulanate (AUGMENTIN ) 500-125 MG per tablet 1 tablet  Status:  Discontinued        1 tablet Oral Every 12 hours 10/26/23 2155 10/27/23 0849   10/26/23 2245  amoxicillin -clavulanate (AUGMENTIN ) 500-125 MG per tablet 1 tablet  Status:  Discontinued        1 tablet Oral Every 12 hours 10/26/23 2154 10/26/23 2155   10/23/23 1800  vancomycin  (VANCOCIN ) IVPB 1000 mg/200 mL premix  Status:  Discontinued        1,000 mg 200 mL/hr over 60 Minutes Intravenous Every M-W-F (Hemodialysis) 10/23/23 1457 10/27/23 0846   10/20/23 1800  ceFEPIme  (  MAXIPIME ) 1 g in sodium chloride  0.9 % 100 mL IVPB  Status:  Discontinued        1 g 200 mL/hr over 30 Minutes Intravenous Every 24 hours 10/19/23 1311 10/26/23 2154   10/20/23 1200  vancomycin  (VANCOCIN ) IVPB 1000 mg/200 mL premix  Status:  Discontinued        1,000 mg 200 mL/hr over 60 Minutes Intravenous Every M-W-F (Hemodialysis) 10/20/23 0725 10/23/23 1457   10/19/23 2200  metroNIDAZOLE  (FLAGYL ) tablet 500 mg  Status:  Discontinued        500 mg Oral Every 12 hours 10/19/23 1235 10/26/23 2154   10/19/23 1310  vancomycin  variable dose per unstable renal function (pharmacist dosing)  Status:  Discontinued         Does not apply See admin instructions 10/19/23 1311 10/20/23 0725   10/19/23 0845  ceFEPIme  (MAXIPIME ) 2 g in sodium chloride  0.9 % 100 mL IVPB        2 g 200 mL/hr over 30 Minutes Intravenous  Once 10/19/23 0839  10/19/23 1347   10/19/23 0845  metroNIDAZOLE  (FLAGYL ) IVPB 500 mg        500 mg 100 mL/hr over 60 Minutes Intravenous  Once 10/19/23 0839 10/19/23 1519   10/19/23 0845  vancomycin  (VANCOCIN ) IVPB 1000 mg/200 mL premix  Status:  Discontinued        1,000 mg 200 mL/hr over 60 Minutes Intravenous  Once 10/19/23 0839 10/19/23 0839   10/19/23 0845  vancomycin  (VANCOREADY) IVPB 2000 mg/400 mL        2,000 mg 200 mL/hr over 120 Minutes Intravenous STAT 10/19/23 0839 10/19/23 1207       Subjective: No new complaints.  Objective: Vitals:   11/06/23 1106 11/06/23 1113 11/06/23 1132 11/06/23 1555  BP: 111/67 101/60 114/66 (!) 111/57  Pulse: 82 81 87 87  Resp: (!) 22 18 (!) 22 20  Temp:  98.2 F (36.8 C) 98.6 F (37 C) 98.5 F (36.9 C)  TempSrc:  Oral Oral Oral  SpO2: 93% 93% 98% 98%  Weight:      Height:        Intake/Output Summary (Last 24 hours) at 11/06/2023 1923 Last data filed at 11/06/2023 1700 Gross per 24 hour  Intake 200 ml  Output 900 ml  Net -700 ml   Filed Weights   11/03/23 0727 11/03/23 1112 11/06/23 0743  Weight: 92.4 kg 91.2 kg 94.8 kg    Examination:  General exam: Patient is obese.  Not in any distress.  Seen in hemodialysis.   HEENT: Patient is pale. Respiratory system:  no wheezes or crackles  Cardiovascular system: S1 & S2 heard.  Gastrointestinal system: Abdomen is soft and nontender. Central nervous system: Alert and oriented Extremities: Status post amputation of left forefoot.   Data Reviewed: I have personally reviewed following labs and imaging studies  CBC: Recent Labs  Lab 11/01/23 0205 11/01/23 2002 11/02/23 0835 11/03/23 0224 11/06/23 0755  WBC 10.1 20.8* 14.4* 10.0 9.1  NEUTROABS 8.0* 19.0*  --   --   --   HGB 9.7* 11.3* 11.0* 10.0* 10.3*  HCT 30.1* 35.7* 34.1* 30.6* 30.6*  MCV 95.9 97.0 96.9 95.6 93.0  PLT 281 332 313 281 280   Basic Metabolic Panel: Recent Labs  Lab 11/01/23 0205 11/01/23 2002 11/02/23 0220  11/03/23 0224 11/06/23 0755  NA 134* 134* 135 135 130*  K 5.5* 4.5 4.8 4.9 5.4*  CL 93* 90* 96* 92* 89*  CO2 24 26 23  24  22  GLUCOSE 224* 133* 101* 126* 104*  BUN 66* 33* 37* 57* 78*  CREATININE 12.73* 8.01* 8.96* 11.40* 12.92*  CALCIUM  9.2 9.7 9.1 9.4 9.9  PHOS 7.9*  --   --   --  8.1*     Recent Results (from the past 240 hours)  Culture, blood (Routine X 2) w Reflex to ID Panel     Status: None   Collection Time: 11/01/23  7:48 PM   Specimen: BLOOD  Result Value Ref Range Status   Specimen Description BLOOD SITE NOT SPECIFIED  Final   Special Requests   Final    BOTTLES DRAWN AEROBIC AND ANAEROBIC Blood Culture results may not be optimal due to an inadequate volume of blood received in culture bottles   Culture   Final    NO GROWTH 5 DAYS Performed at [facility], [address], [place], [place] [address]    Report Status 11/06/2023 FINAL  Final  Culture, blood (Routine X 2) w Reflex to ID Panel     Status: None   Collection Time: 11/01/23  7:57 PM   Specimen: BLOOD  Result Value Ref Range Status   Specimen Description BLOOD SITE NOT SPECIFIED  Final   Special Requests   Final    BOTTLES DRAWN AEROBIC AND ANAEROBIC Blood Culture adequate volume   Culture   Final    NO GROWTH 5 DAYS Performed at [facility], [address], [place], [place] [address]    Report Status 11/06/2023 FINAL  Final     Radiology Studies: No results found.   Scheduled Meds:  (feeding supplement) PROSource Plus  30 mL Oral BID BM   aspirin  EC  81 mg Oral Daily   Chlorhexidine  Gluconate Cloth  6 each Topical Q0600   cinacalcet   60 mg Oral Q T,Th,Sat-1800   [START ON 11/10/2023] darbepoetin (ARANESP ) injection - DIALYSIS  60 mcg Subcutaneous Q Fri-1800   gabapentin   100 mg Oral TID   heparin  injection (subcutaneous)  5,000 Units Subcutaneous Q8H   insulin  aspart  0-6 Units Subcutaneous TID WC   insulin  aspart  4 Units Subcutaneous TID WC   insulin  glargine  50 Units  Subcutaneous Daily   multivitamin  1 tablet Oral QHS   mupirocin  cream   Topical QODAY   pantoprazole   40 mg Oral Daily   polyethylene glycol  17 g Oral Daily   rosuvastatin   10 mg Oral Daily   senna-docusate  1 tablet Oral BID   sevelamer  carbonate  1,600 mg Oral TID WC   Continuous Infusions:  cefTAZidime (FORTAZ)  IV 1 g (11/06/23 1638)   [START ON 11/08/2023] vancomycin          LOS: 18 days   Leatrice LILLETTE Chapel, MD [facility] P10/13/2025, 7:23 PM

## 2023-11-06 NOTE — Progress Notes (Signed)
 [facility] Progress Note   Subjective:    Seen and examined patient on HD. Informed UF had to be turned off 2nd hypotension. He denies any acute issues. Tentative plan for SNF placement. Also plan for him to get ABXs with HD at dc.   Objective Vitals:   11/06/23 0945 11/06/23 0947 11/06/23 0949 11/06/23 0950  BP: (!) 70/53 (!) 87/56 (!) 83/55 (!) 94/58  Pulse: 88 88 87 86  Resp: 17 (!) 25 20 (!) 24  Temp:      TempSrc:      SpO2: 92% 91% 93% 95%  Weight:      Height:       Physical Exam General: alert, nad  Heart: RRR Resp: nml WOB on RA Abdomen: non distended Extremities: no LE edema, L foot dressed , some dried blood on dressing, + burnished skin Dialysis Access: LU AVF +bruit   Filed Weights   11/03/23 0727 11/03/23 1112 11/06/23 0743  Weight: 92.4 kg 91.2 kg 94.8 kg    Intake/Output Summary (Last 24 hours) at 11/06/2023 1100 Last data filed at 11/05/2023 1700 Gross per 24 hour  Intake 480 ml  Output 300 ml  Net 180 ml    Additional Objective Labs: Basic Metabolic Panel: Recent Labs  Lab 11/01/23 0205 11/01/23 2002 11/02/23 0220 11/03/23 0224 11/06/23 0755  NA 134*   < > 135 135 130*  K 5.5*   < > 4.8 4.9 5.4*  CL 93*   < > 96* 92* 89*  CO2 24   < > 23 24 22   GLUCOSE 224*   < > 101* 126* 104*  BUN 66*   < > 37* 57* 78*  CREATININE 12.73*   < > 8.96* 11.40* 12.92*  CALCIUM  9.2   < > 9.1 9.4 9.9  PHOS 7.9*  --   --   --  8.1*   < > = values in this interval not displayed.   Liver Function Tests: Recent Labs  Lab 11/01/23 0205 11/01/23 2002 11/06/23 0755  AST  --  21  --   ALT  --  16  --   ALKPHOS  --  248*  --   BILITOT  --  1.1  --   PROT  --  8.3*  --   ALBUMIN 2.5* 2.9* 2.7*   No results for input(s): LIPASE, AMYLASE in the last 168 hours. CBC: Recent Labs  Lab 11/01/23 0205 11/01/23 2002 11/02/23 0835 11/03/23 0224 11/06/23 0755  WBC 10.1 20.8* 14.4* 10.0 9.1  NEUTROABS 8.0* 19.0*  --   --   --   HGB 9.7*  11.3* 11.0* 10.0* 10.3*  HCT 30.1* 35.7* 34.1* 30.6* 30.6*  MCV 95.9 97.0 96.9 95.6 93.0  PLT 281 332 313 281 280   Blood Culture    Component Value Date/Time   SDES BLOOD SITE NOT SPECIFIED 11/01/2023 1957   SPECREQUEST  11/01/2023 1957    BOTTLES DRAWN AEROBIC AND ANAEROBIC Blood Culture adequate volume   CULT  11/01/2023 1957    NO GROWTH 5 DAYS Performed at [facility], [address] [place], [place] [address]    REPTSTATUS 11/06/2023 FINAL 11/01/2023 1957    Cardiac Enzymes: No results for input(s): CKTOTAL, CKMB, CKMBINDEX, TROPONINI in the last 168 hours. CBG: Recent Labs  Lab 11/04/23 1613 11/04/23 2126 11/05/23 0533 11/05/23 1100 11/05/23 1609  GLUCAP 216* 117* 177* 180* 195*   Iron Studies: No results for input(s): IRON, TIBC, TRANSFERRIN, FERRITIN in  the last 72 hours. Lab Results  Component Value Date   INR 1.1 11/01/2023   INR 1.1 10/19/2023   INR 1.0 11/14/2018   Studies/Results: No results found.  Medications:  cefTAZidime (FORTAZ)  IV      (feeding supplement) PROSource Plus  30 mL Oral BID BM   aspirin  EC  81 mg Oral Daily   Chlorhexidine  Gluconate Cloth  6 each Topical Q0600   cinacalcet   60 mg Oral Q T,Th,Sat-1800   [START ON 11/10/2023] darbepoetin (ARANESP ) injection - DIALYSIS  60 mcg Subcutaneous Q Fri-1800   gabapentin   100 mg Oral TID   heparin  injection (subcutaneous)  5,000 Units Subcutaneous Q8H   insulin  aspart  0-6 Units Subcutaneous TID WC   insulin  aspart  4 Units Subcutaneous TID WC   insulin  glargine  50 Units Subcutaneous Daily   multivitamin  1 tablet Oral QHS   mupirocin  cream   Topical QODAY   pantoprazole   40 mg Oral Daily   polyethylene glycol  17 g Oral Daily   rosuvastatin   10 mg Oral Daily   senna-docusate  1 tablet Oral BID   sevelamer  carbonate  1,600 mg Oral TID WC   vancomycin  variable dose per unstable renal function (pharmacist dosing)   Does not apply See admin instructions     Dialysis Orders: MWF - NW 4hr, 400/A1.5, EDW 96.8kg, 2K/2.5Ca bath, AVF, Heparin  2600 - no ESA, Hgb > goal - Calcitriol 1.75 three times per week  Assessment/Plan: Sepsis/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 9/29 tissue of left foot cx: Enterococcus faecalis (pan-sensitive), MSSA (pan-sensitive), Stenotrophomonas maltophilia (pan-sensitive). S/p TEE showing calcific mass on MV leaflet consistent with endocarditis.  Appreciate cardiology and ID.  Please note that he is not a PICC candidate in the setting of ESRD. 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  Per ID, will do vanc/ ceftaz, end date of therapy to be 11/19. We are working on ensuring his outpatient HD center has the ABXs ready before patient is officially discharged. ESRD.  Continue usual MWF HD - On HD.   Hypertension/volume. BP controlled, min LE edema, but low Na - UF as tolerated. Anemia. Hgb 10.6. No indication for ESA at this time.  Metabolic bone disease.  CorrCa high sided, VDRA on hold. Phos above goal. Continue home binders/cinacalcet . Nutrition: Alb low, continue supplements.  Renal diet w/fluid restrictions.  T2DM: Insulin  per primary. DVT prophylaxis: Prefer heparin  over Lovenox  in ESRD patient Bradycardia - HR to 30s on 10/5. Cardiology consulted. Report Wenckeback conduction.  No additional work up indicated. Recommend K>4 and Mg>2, avoid AV nodal blockers.  Dispo: to [facility] when cleared  Charmaine Piety, NP [facility] 11/06/2023,11:00 AM  LOS: 18 days

## 2023-11-06 NOTE — TOC Progression Note (Addendum)
 Transition of Care [facility]) - Progression Note    Patient Details  Name: Edgar Brewer MRN: [id number] Date of Birth: [birth year]  Transition of Care [facility]) CM/SW Contact  Luise JAYSON Pan, [place] Phone Number: 11/06/2023, 10:33 AM  Clinical Narrative:   Per Madelin with [facility], facility may need to restart auth for SNF.   Per ID note, patient is not eligible for PICC placement. Will need IV abx at dialysis. Renal Navigator aware.   10:44 AM Per [facility], facility to restart auth today.   CSW will continue to follow.    Expected Discharge Plan and Services         Expected Discharge Date: 10/31/23                                     Social Drivers of Health (SDOH) Interventions SDOH Screenings   Food Insecurity: No Food Insecurity (10/20/2023)  Housing: Low Risk  (10/20/2023)  Transportation Needs: No Transportation Needs (10/20/2023)  Utilities: Not At Risk (10/20/2023)  Depression (PHQ2-9): Low Risk  (09/19/2023)  Financial Resource Strain: Low Risk  (04/19/2022)  Physical Activity: Inactive (04/19/2022)  Social Connections: Unknown (04/19/2022)  Stress: No Stress Concern Present (04/19/2022)  Tobacco Use: Medium Risk (10/23/2023)    Readmission Risk Interventions    10/20/2023    4:09 PM  Readmission Risk Prevention Plan  Transportation Screening Complete  PCP or Specialist Appt within 3-5 Days Complete  HRI or Home Care Consult Complete  Social Work Consult for Recovery Care Planning/Counseling Complete  Palliative Care Screening Not Applicable

## 2023-11-06 NOTE — Progress Notes (Signed)
 Pharmacy Antibiotic Note  Edgar Brewer is a 63 y.o. male admitted on 10/19/2023 with endocarditis.  Pharmacy has been consulted for Ceftazidime and vancomycin  dosing.  -Vancomycin  2gm given 10/8 --vancomycin  random level= 43 on 10/9 -HD 10/10 -vancomycin  random level= 25 on 10/11 -HD 10/13 (already completed)   Plan: - Fortaz 1g IV qHD (MWF) -Vancomycin  750m  IV today -Vancomycin  750mg  qHD (MWF)  -Would check a pre-HD level on Friday (10/17) or Monday (10/20 if possible -End of therapy 11/19  Height: 5' 8 (172.7 cm) Weight: 94.8 kg (208 lb 15.9 oz) IBW/kg (Calculated) : 68.4  Temp (24hrs), Avg:98.7 F (37.1 C), Min:98 F (36.7 C), Max:99.9 F (37.7 C)  Recent Labs  Lab 11/01/23 0205 11/01/23 2002 11/01/23 2203 11/02/23 0220 11/02/23 0835 11/03/23 0224 11/04/23 0234 11/06/23 0755  WBC 10.1 20.8*  --   --  14.4* 10.0  --  9.1  CREATININE 12.73* 8.01*  --  8.96*  --  11.40*  --  12.92*  LATICACIDVEN  --  1.5 1.0  --   --   --   --   --   VANCORANDOM  --   --   --  43  --   --  25  --     Estimated Creatinine Clearance: 6.5 mL/min (A) (by C-G formula based on SCr of 12.92 mg/dL (H)).    Allergies  Allergen Reactions   Codeine Anaphylaxis, Hives, Swelling and Other (See Comments)    Swelling all over body and the throat   Clindamycin /Lincomycin Nausea And Vomiting    Antimicrobials this admission: 9/25 cefepime  >> 10/2 9/25 metronidazole  >> 10/2 9/25 vancomycin  >> 10/3  10/3 amoxicillin /clavulanate >> 10/7 10/8 levofloxacin >> 10/11 10/8 vancomycin  >>  10/11 Fortaz >>  Microbiology results: 9/25 BCx: NGTD x5 days 9/29 tissue of left foot cx: Enterococcus faecalis (pan-sensitive), MSSA (pan-sensitive), Stenotrophomonas maltophilia (pan-sensitive)  Thank you for allowing pharmacy to be a part of this patient's care.  Prentice Poisson, PharmD Clinical Pharmacist **Pharmacist phone directory can now be found on [email or web address] ([email or web address]).  Listed under [facility]  Pharmacy.

## 2023-11-06 NOTE — Progress Notes (Signed)
 Contacted [facility] this morning to inquire if clinic has iv abx needed at d/c. Clinic to order ceftaz today and navigator has requested that clinic staff please contact navigator once iv abx arrives to clinic. Will assist as needed.   Randine Mungo Dialysis Navigator [phone]

## 2023-11-06 NOTE — Progress Notes (Signed)
 Patient's mother/guardian, [name], called in by phone to this nurse for status update. Discussed patient will remain in hospital today. Hilda verbalizes understanding.

## 2023-11-07 ENCOUNTER — Encounter: Payer: Medicare (Managed Care) | Admitting: Podiatry

## 2023-11-07 DIAGNOSIS — M86172 Other acute osteomyelitis, left ankle and foot: Secondary | ICD-10-CM | POA: Diagnosis not present

## 2023-11-07 LAB — GLUCOSE, CAPILLARY
Glucose-Capillary: 84 mg/dL (ref 70–99)
Glucose-Capillary: 191 mg/dL — ABNORMAL HIGH (ref 70–99)
Glucose-Capillary: 133 mg/dL — ABNORMAL HIGH (ref 70–99)

## 2023-11-07 LAB — RENAL FUNCTION PANEL
Albumin: 2.7 g/dL — ABNORMAL LOW (ref 3.5–5.0)
Anion gap: 15 (ref 5–15)
BUN: 56 mg/dL — ABNORMAL HIGH (ref 8–23)
CO2: 24 mmol/L (ref 22–32)
Calcium: 9.7 mg/dL (ref 8.9–10.3)
Chloride: 90 mmol/L — ABNORMAL LOW (ref 98–111)
Creatinine, Ser: 10.73 mg/dL — ABNORMAL HIGH (ref 0.61–1.24)
GFR, Estimated: 5 mL/min — ABNORMAL LOW (ref >60)
Glucose, Bld: 187 mg/dL — ABNORMAL HIGH (ref 70–99)
Phosphorus: 7.1 mg/dL — ABNORMAL HIGH (ref 2.5–4.6)
Potassium: 5 mmol/L (ref 3.5–5.1)
Sodium: 129 mmol/L — ABNORMAL LOW (ref 135–145)

## 2023-11-07 MED ORDER — ALBUTEROL SULFATE (2.5 MG/3ML) 0.083% IN NEBU: 2.5000 mg | INHALATION_SOLUTION | RESPIRATORY_TRACT | Status: DC | PRN

## 2023-11-07 MED ORDER — PROSOURCE PLUS PO LIQD: 30.0000 mL | Freq: Two times a day (BID) | ORAL | Status: DC

## 2023-11-07 MED ORDER — SENNOSIDES-DOCUSATE SODIUM 8.6-50 MG PO TABS: 1.0000 | ORAL_TABLET | Freq: Two times a day (BID) | ORAL | Status: DC

## 2023-11-07 MED ORDER — INSULIN ASPART 100 UNIT/ML IJ SOLN: 0.0000 [IU] | Freq: Three times a day (TID) | INTRAMUSCULAR | Status: DC

## 2023-11-07 MED ORDER — POLYETHYLENE GLYCOL 3350 17 G PO PACK: 17.0000 g | PACK | Freq: Every day | ORAL | Status: DC | PRN

## 2023-11-07 MED ORDER — INSULIN ASPART 100 UNIT/ML IJ SOLN: 4.0000 [IU] | Freq: Three times a day (TID) | INTRAMUSCULAR | Status: DC

## 2023-11-07 MED ORDER — VANCOMYCIN HCL 750 MG/150ML IV SOLN: 750.0000 mg | INTRAVENOUS | Status: AC

## 2023-11-07 MED ORDER — SODIUM CHLORIDE 0.9 % IV SOLN: 1.0000 g | INTRAVENOUS | Status: AC

## 2023-11-07 MED ORDER — ACETAMINOPHEN 500 MG PO TABS: 1000.0000 mg | ORAL_TABLET | Freq: Four times a day (QID) | ORAL | Status: DC | PRN

## 2023-11-07 MED ORDER — HYDROCODONE-ACETAMINOPHEN 5-325 MG PO TABS: 1.0000 | ORAL_TABLET | Freq: Four times a day (QID) | ORAL | 0 refills | Status: DC | PRN

## 2023-11-07 NOTE — Progress Notes (Signed)
 Report called to [facility], report given to [facility] LPN. Patient is getting discharge to room 140. Will be picked up by [facility] .

## 2023-11-07 NOTE — TOC Transition Note (Signed)
 Transition of Care [facility]) - Discharge Note   Patient Details  Name: Edgar Brewer MRN: [id number] Date of Birth: 1960/05/28  Transition of [facility]) CM/SW Contact:  Luise JAYSON Pan, LCSWA Phone Number: 11/07/2023, 3:12 PM   Clinical Narrative:   Patient will DC to: [facility] Anticipated DC date: 11/07/23  Family notified: [name] (mother)  Transport by: [facility]   Per MD patient ready for DC to [facility]. RN to call report prior to discharge [phone]; room room 140). RN, patient, patient's family, and facility notified of DC. Discharge Summary and FL2 sent to facility. DC packet on chart. Ambulance transport requested for patient 3:08PM.   CSW will sign off for now as social work intervention is no longer needed. Please consult us  again if new needs arise.      Final next level of care: Skilled Nursing Facility Barriers to Discharge: Barriers Resolved   Patient Goals and CMS Choice            Discharge Placement              Patient chooses bed at: Other - please specify in the comment section below: [facility]) Patient to be transferred to facility by: PTAR Name of family member notified: [name] (mother) Patient and family notified of of transfer: 11/07/23  Discharge Plan and Services Additional resources added to the After Visit Summary for                                       Social Drivers of Health (SDOH) Interventions SDOH Screenings   Food Insecurity: No Food Insecurity (10/20/2023)  Housing: Low Risk  (10/20/2023)  Transportation Needs: No Transportation Needs (10/20/2023)  Utilities: Not At Risk (10/20/2023)  Depression (PHQ2-9): Low Risk  (09/19/2023)  Financial Resource Strain: Low Risk  (04/19/2022)  Physical Activity: Inactive (04/19/2022)  Social Connections: Unknown (04/19/2022)  Stress: No Stress Concern Present (04/19/2022)  Tobacco Use: Medium Risk (10/23/2023)     Readmission Risk Interventions    10/20/2023    4:09  PM  Readmission Risk Prevention Plan  Transportation Screening Complete  PCP or Specialist Appt within 3-5 Days Complete  HRI or Home Care Consult Complete  Social Work Consult for Recovery Care Planning/Counseling Complete  Palliative Care Screening Not Applicable

## 2023-11-07 NOTE — Progress Notes (Signed)
  [facility] Progress Note   Subjective:    Seen and examined patient at bedside. Informed [facility] has his ABXs for discharge and PA for SNF has been approved. D/w Hospitalist, plan for discharge today.  Objective Vitals:   11/06/23 1943 11/07/23 0509 11/07/23 0744 11/07/23 1041  BP: 115/60 (!) 114/55 105/66 (!) 91/55  Pulse: 68 81 87 88  Resp: (!) 25 17 17 18   Temp: 99.1 F (37.3 C) 97.8 F (36.6 C) 98.5 F (36.9 C) 98.4 F (36.9 C)  TempSrc: Oral Oral Oral Oral  SpO2: 100% 90% 97% 95%  Weight:      Height:       Physical Exam General: alert, nad  Heart: RRR Resp: nml WOB on RA Abdomen: non distended Extremities: no LE edema, L foot dressed , some dried blood on dressing, + burnished skin Dialysis Access: LU AVF +bruit   Filed Weights   11/03/23 0727 11/03/23 1112 11/06/23 0743  Weight: 92.4 kg 91.2 kg 94.8 kg    Intake/Output Summary (Last 24 hours) at 11/07/2023 1451 Last data filed at 11/07/2023 1000 Gross per 24 hour  Intake 560 ml  Output --  Net 560 ml    Additional Objective Labs: Basic Metabolic Panel: Recent Labs  Lab 11/01/23 0205 11/01/23 2002 11/03/23 0224 11/06/23 0755 11/07/23 1007  NA 134*   < > 135 130* 129*  K 5.5*   < > 4.9 5.4* 5.0  CL 93*   < > 92* 89* 90*  CO2 24   < > 24 22 24   GLUCOSE 224*   < > 126* 104* 187*  BUN 66*   < > 57* 78* 56*  CREATININE 12.73*   < > 11.40* 12.92* 10.73*  CALCIUM  9.2   < > 9.4 9.9 9.7  PHOS 7.9*  --   --  8.1* 7.1*   < > = values in this interval not displayed.   Liver Function Tests: Recent Labs  Lab 11/01/23 2002 11/06/23 0755 11/07/23 1007  AST 21  --   --   ALT 16  --   --   ALKPHOS 248*  --   --   BILITOT 1.1  --   --   PROT 8.3*  --   --   ALBUMIN 2.9* 2.7* 2.7*   No results for input(s): LIPASE, AMYLASE in the last 168 hours. CBC: Recent Labs  Lab 11/01/23 0205 11/01/23 2002 11/02/23 0835 11/03/23 0224 11/06/23 0755  WBC 10.1 20.8* 14.4* 10.0 9.1   NEUTROABS 8.0* 19.0*  --   --   --   HGB 9.7* 11.3* 11.0* 10.0* 10.3*  HCT 30.1* 35.7* 34.1* 30.6* 30.6*  MCV 95.9 97.0 96.9 95.6 93.0  PLT 281 332 313 281 280   Blood Culture    Component Value Date/Time   SDES BLOOD SITE NOT SPECIFIED 11/01/2023 1957   SPECREQUEST  11/01/2023 1957    BOTTLES DRAWN AEROBIC AND ANAEROBIC Blood Culture adequate volume   CULT  11/01/2023 1957    NO GROWTH 5 DAYS Performed at [facility], [address] [place], [place] [address]    REPTSTATUS 11/06/2023 FINAL 11/01/2023 1957    Cardiac Enzymes: No results for input(s): CKTOTAL, CKMB, CKMBINDEX, TROPONINI in the last 168 hours. CBG: Recent Labs  Lab 11/06/23 1136 11/06/23 1553 11/06/23 2110 11/07/23 0548 11/07/23 1108  GLUCAP 87 150* 141* 84 191*   Iron Studies: No results for input(s): IRON, TIBC, TRANSFERRIN, FERRITIN in the last 72 hours. Lab  Results  Component Value Date   INR 1.1 11/01/2023   INR 1.1 10/19/2023   INR 1.0 11/14/2018   Studies/Results: No results found.  Medications:  cefTAZidime (FORTAZ)  IV 1 g (11/06/23 1638)   [START ON 11/08/2023] vancomycin       (feeding supplement) PROSource Plus  30 mL Oral BID BM   aspirin  EC  81 mg Oral Daily   Chlorhexidine  Gluconate Cloth  6 each Topical Q0600   cinacalcet   60 mg Oral Q T,Th,Sat-1800   [START ON 11/10/2023] darbepoetin (ARANESP ) injection - DIALYSIS  60 mcg Subcutaneous Q Fri-1800   gabapentin   100 mg Oral TID   heparin  injection (subcutaneous)  5,000 Units Subcutaneous Q8H   insulin  aspart  0-6 Units Subcutaneous TID WC   insulin  aspart  4 Units Subcutaneous TID WC   insulin  glargine  50 Units Subcutaneous Daily   multivitamin  1 tablet Oral QHS   mupirocin  cream   Topical QODAY   pantoprazole   40 mg Oral Daily   rosuvastatin   10 mg Oral Daily   senna-docusate  1 tablet Oral BID   sevelamer  carbonate  1,600 mg Oral TID WC    Dialysis Orders: MWF - [facility] 4hr, 400/A1.5, EDW 96.8kg,  2K/2.5Ca bath, AVF, Heparin  2600 - no ESA, Hgb > goal - Calcitriol 1.75 three times per week  Assessment/Plan: Sepsis/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 9/29 tissue of left foot cx: Enterococcus faecalis (pan-sensitive), MSSA (pan-sensitive), Stenotrophomonas maltophilia (pan-sensitive). S/p TEE showing calcific mass on MV leaflet consistent with endocarditis.  Appreciate cardiology and ID.  Please note that he is not a PICC candidate in the setting of ESRD. 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  Per ID, will do vanc 750mg  IV and Ceftazidime 1GM IV with HD, end date of therapy to be 11/19. [facility] confirmed they have both ABXs. Per pharmacy note from 10/13: check vanc level pre-HD either on 10/17 or 10/20. ESRD.  Continue usual MWF HD - Next HD 10/15 in outpatient.   Hypertension/volume. BP controlled, min LE edema, but low Na - UF as tolerated. Anemia. Hgb 10.6. No indication for ESA at this time.  Metabolic bone disease.  CorrCa high sided, VDRA on hold. Phos above goal. Continue home binders/cinacalcet . Nutrition: Alb low, continue supplements.  Renal diet w/fluid restrictions.  T2DM: Insulin  per primary. DVT prophylaxis: Prefer heparin  over Lovenox  in ESRD patient Bradycardia - HR to 30s on 10/5. Cardiology consulted. Report [place] conduction.  No additional work up indicated. Recommend K>4 and Mg>2, avoid AV nodal blockers.  Dispo: PA for SNF approved today. Okay for discharge from a renal standpoint.  Charmaine Piety, NP [facility] 11/07/2023,2:51 PM  LOS: 19 days

## 2023-11-07 NOTE — Progress Notes (Signed)
 PTAR called with 15 min heads up. Last set of vitals within normal, packed pt's belongings. Ortho shoes in left foot. Awaiting PTAR.

## 2023-11-07 NOTE — Progress Notes (Addendum)
 Contacted by [facility] clinic RN to say that HD clinic has received iv ceftazidime. This info was provided to attending, renal NP, and CSW. Clinic advised that snf shara is currently pending per CSW. Will assist as needed.   Randine Mungo Dialysis Navigator [phone]  Addendum at 3:10 pm: Pt to d/c to snf today. Contacted [facility] to be advised of pt's d/c today and that pt should resume care tomorrow. Renal NP will send orders for iv abx with HD to clinic at d/c. Clinic provided snf name. Will add HD info to AVS as well.

## 2023-11-07 NOTE — Progress Notes (Signed)
 Mobility Specialist Progress Note:    11/07/23 1544  Mobility  Activity Ambulated with assistance  Level of Assistance Minimal assist, patient does 75% or more  Assistive Device Front wheel walker  Distance Ambulated (ft) 15 ft  LLE Weight Bearing Per Provider Order [facility]  Activity Response Tolerated fair  Mobility Referral Yes  Mobility visit 1 Mobility  Mobility Specialist Start Time (ACUTE ONLY) 1544  Mobility Specialist Stop Time (ACUTE ONLY) 1603  Mobility Specialist Time Calculation (min) (ACUTE ONLY) 19 min   Received pt sitting on EOB agreeable to session. Pt knows and is good for donning left shoe. Pt c/o fatigue using walker and trying to keep weight off left foot. Pt needing constant cueing in order to follow [facility] LLE. Returned pt to sink for bath w/ NT.  Venetia Keel Mobility Specialist Please Contact via SecureChat or Rehab Office at [phone]

## 2023-11-07 NOTE — Progress Notes (Addendum)
 AVS in packet for transport  Script In packet.

## 2023-11-07 NOTE — Plan of Care (Signed)
  Problem: Education: Goal: Ability to describe self-care measures that may prevent or decrease complications (Diabetes Survival Skills Education) will improve 11/07/2023 1047 by Gary Michaelis, RN Outcome: Progressing 11/07/2023 1047 by Gary Michaelis, RN Outcome: Progressing Goal: Individualized Educational Video(s) 11/07/2023 1047 by Gary Michaelis, RN Outcome: Progressing 11/07/2023 1047 by Gary Michaelis, RN Outcome: Progressing   Problem: Coping: Goal: Ability to adjust to condition or change in health will improve 11/07/2023 1047 by Gary Michaelis, RN Outcome: Progressing 11/07/2023 1047 by Gary Michaelis, RN Outcome: Progressing   Problem: Fluid Volume: Goal: Ability to maintain a balanced intake and output will improve 11/07/2023 1047 by Gary Michaelis, RN Outcome: Progressing 11/07/2023 1047 by Gary Michaelis, RN Outcome: Progressing   Problem: Health Behavior/Discharge Planning: Goal: Ability to identify and utilize available resources and services will improve 11/07/2023 1047 by Gary Michaelis, RN Outcome: Progressing 11/07/2023 1047 by Gary Michaelis, RN Outcome: Progressing Goal: Ability to manage health-related needs will improve 11/07/2023 1047 by Gary Michaelis, RN Outcome: Progressing 11/07/2023 1047 by Gary Michaelis, RN Outcome: Progressing   Problem: Metabolic: Goal: Ability to maintain appropriate glucose levels will improve 11/07/2023 1047 by Gary Michaelis, RN Outcome: Progressing 11/07/2023 1047 by Gary Michaelis, RN Outcome: Progressing   Problem: Nutritional: Goal: Maintenance of adequate nutrition will improve 11/07/2023 1047 by Gary Michaelis, RN Outcome: Progressing 11/07/2023 1047 by Gary Michaelis, RN Outcome: Progressing Goal: Progress toward achieving an optimal weight will improve 11/07/2023 1047 by Gary Michaelis, RN Outcome: Progressing 11/07/2023 1047 by Gary Michaelis,  RN Outcome: Progressing   Problem: Skin Integrity: Goal: Risk for impaired skin integrity will decrease 11/07/2023 1047 by Gary Michaelis, RN Outcome: Progressing 11/07/2023 1047 by Gary Michaelis, RN Outcome: Progressing   Problem: Tissue Perfusion: Goal: Adequacy of tissue perfusion will improve 11/07/2023 1047 by Gary Michaelis, RN Outcome: Progressing 11/07/2023 1047 by Gary Michaelis, RN Outcome: Progressing   Problem: Education: Goal: Knowledge of General Education information will improve Description: Including pain rating scale, medication(s)/side effects and non-pharmacologic comfort measures 11/07/2023 1047 by Gary Michaelis, RN Outcome: Progressing 11/07/2023 1047 by Gary Michaelis, RN Outcome: Progressing   Problem: Health Behavior/Discharge Planning: Goal: Ability to manage health-related needs will improve 11/07/2023 1047 by Gary Michaelis, RN Outcome: Progressing 11/07/2023 1047 by Gary Michaelis, RN Outcome: Progressing   Problem: Clinical Measurements: Goal: Ability to maintain clinical measurements within normal limits will improve Outcome: Progressing Goal: Will remain free from infection Outcome: Progressing Goal: Diagnostic test results will improve Outcome: Progressing Goal: Respiratory complications will improve Outcome: Progressing Goal: Cardiovascular complication will be avoided Outcome: Progressing   Problem: Activity: Goal: Risk for activity intolerance will decrease Outcome: Progressing   Problem: Nutrition: Goal: Adequate nutrition will be maintained Outcome: Progressing   Problem: Coping: Goal: Level of anxiety will decrease Outcome: Progressing   Problem: Elimination: Goal: Will not experience complications related to bowel motility Outcome: Progressing   Problem: Pain Managment: Goal: General experience of comfort will improve and/or be controlled Outcome: Progressing   Problem: Safety: Goal:  Ability to remain free from injury will improve Outcome: Progressing   Problem: Skin Integrity: Goal: Risk for impaired skin integrity will decrease Outcome: Progressing

## 2023-11-07 NOTE — Progress Notes (Signed)
 Patient discharged to [facility] via [facility].

## 2023-11-07 NOTE — Care Management Important Message (Deleted)
 Important Message  Patient Details  Name: Edgar Brewer MRN: [id number] Date of Birth: 08-04-1960   Important Message Given:  Yes - Medicare IM     Vonzell Arrie Sharps 11/07/2023, 10:31 AM

## 2023-11-07 NOTE — TOC Progression Note (Addendum)
 Transition of Care [facility]) - Progression Note    Patient Details  Name: Edgar Brewer MRN: [id number] Date of Birth: Jan 31, 1960  [facility]) CM/SW Contact  Luise JAYSON Pan, [place] Phone Number: 11/07/2023, 11:42 AM  Clinical Narrative:   Per [facility], shara is pending.   1:50 PM CSW updated patients mother on current disposition. CSW awaiting auth.   2:40 PM Per facility, insurance authorization is approved. CSW notified medical team.   CSW will continue to follow.     Expected Discharge Plan and Services         Expected Discharge Date: 10/31/23                                     Social Drivers of Health (SDOH) Interventions SDOH Screenings   Food Insecurity: No Food Insecurity (10/20/2023)  Housing: Low Risk  (10/20/2023)  Transportation Needs: No Transportation Needs (10/20/2023)  Utilities: Not At Risk (10/20/2023)  Depression (PHQ2-9): Low Risk  (09/19/2023)  Financial Resource Strain: Low Risk  (04/19/2022)  Physical Activity: Inactive (04/19/2022)  Social Connections: Unknown (04/19/2022)  Stress: No Stress Concern Present (04/19/2022)  Tobacco Use: Medium Risk (10/23/2023)    Readmission Risk Interventions    10/20/2023    4:09 PM  Readmission Risk Prevention Plan  Transportation Screening Complete  PCP or Specialist Appt within 3-5 Days Complete  HRI or Home Care Consult Complete  Social Work Consult for Recovery Care Planning/Counseling Complete  Palliative Care Screening Not Applicable

## 2023-11-07 NOTE — Discharge Summary (Signed)
 Physician Discharge Summary  Edgar Brewer FMW:[id number] DOB: 1961-01-11 DOA: 10/19/2023  PCP: Katrinka Garnette KIDD, MD  Admit date: 10/19/2023 Discharge date: 11/07/2023  Admitted from: Home Discharge disposition: SNF  Recommendations at discharge:  Per ID recommendation, 6 weeks of antibiotics with IV vancomycin  and IV ceftazidime to be given with dialysis, EOT 12/13/2023   Subjective: Patient was seen and examined this morning. Middle-aged Caucasian male.  Lying on bed.  No acute distress.  No new symptoms. Pending SNF Chart reviewed. Afebrile, hemodynamically stable, breathing room air  Brief narrative: Edgar Brewer is a 63 y.o. male with PMH significant for ESRD-HD-MWF, DM2, HTN, CKD, stroke, GERD. Patient was recently hospitalized for amputation of L 2nd toe through proximal phalanx.   9/25, patient presented with complaint of purulent discharge from the left second toe, as well as, chills.   On presentation he was febrile, tachycardic, tachypneic, requiring 2 L of oxygen.  Labs revealed potassium 5.2, creatinine 5.7, lactic acidosis 5.6. XR L foot w/o acute findings or OM. Started on broad-spectrum IV antibiotics Patient was admitted to [facility] for further management of sepsis secondary to left foot wound infection.   Podiatry and vascular surgery teams were consulted.   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 Malvin Podiatry recommended nonweightbearing on postop shoe left lower extremity for 2 weeks. 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  It showed 1.1 cm calcified mobile echodensity on the anterior mitral valve leaflet.  10/8, TEE confirmed endocarditis.   ID was consulted.  Blood cultures have been negative.   Infectious disease team has advised 6 weeks of antibiotics with IV vancomycin  and IV ceftazidime to be given with dialysis, EOT 12/13/2023   Bradycardia secondary heart block Became bradycardia on 10/28/2023.  EKG revealed second-degree heart block, Mobitz type I/Wenckebach.   Heart rate stabilized without atropine or temporary pacemaker. TSH normal. Echo 10/7 with infective endocarditis as above.  Showed EF 60 to 65%, normal RV function   Type 2 diabetes mellitus Diabetic neuropathy A1c 6.3 10/19/2023 Currently blood sugar levels controlled on Lantus  50 units daily, SSI/Accu-Cheks Continue gabapentin  100 mg 3 times daily Recent Labs  Lab 11/06/23 1136 11/06/23 1553 11/06/23 2110 11/07/23 0548 11/07/23 1108  GLUCAP 87 150* 141* 84 191*   ESRD-HD-MWF Nephrology following for dialysis.   Continue Renvela , Sensipar .  Dialyzed on schedule.  Hyponatremia Mild chronic related to dialysis status.  Continue to monitor as an outpatient Recent Labs  Lab 11/01/23 0205 11/01/23 2002 11/02/23 0220 11/03/23 0224 11/06/23 0755 11/07/23 1007  NA 134* 134* 135 135 130* 129*   Peripheral artery disease Continue aspirin  81 mg daily, Crestor  10 mg daily Follows-up with vascular surgery as an outpatient.   Constipation Continue bowel regimen with Senokot twice daily, MiraLAX daily as needed   Obesity 1 Body mass index is 31.78 kg/m. Patient has been advised to make an attempt to improve diet and exercise patterns to aid in weight loss.  Impaired mobility PT recommended SNF on discharge.    Goals of care   Code Status: Full Code   Diet:  Diet Order  Diet general           Diet renal with fluid restriction Fluid restriction: 1200 mL Fluid; Room service appropriate? Yes; Fluid  consistency: Thin  Diet effective now                   Nutritional status:  Body mass index is 31.78 kg/m.       Wounds:  - Wound 11/04/23 0800 Surgical Closed Surgical Incision Foot Left (Active)  Date First Assessed/Time First Assessed: 11/04/23 0800   Primary Wound Type: Surgical  Secondary Wound Type - Surgical: Closed Surgical Incision  Location: (c) Foot  Location Orientation: Left  Wound Outcome: Amputated    Assessments 11/04/2023  9:00 AM 11/07/2023  7:44 AM  Site / Wound Assessment -- Dressing in place / Unable to assess  Dressing Type -- Gauze (Comment)  Dressing Status Old drainage Clean, Dry, Intact     No associated orders.     Wound 11/04/23 0800 Other (Comment) Toe (Comment  which one) Right (Active)  Date First Assessed/Time First Assessed: 11/04/23 0800   Present on Original Admission: Yes  Primary Wound Type: Other (Comment)  Location: (c) Toe (Comment  which one)  Location Orientation: Right    Assessments 11/04/2023 12:00 PM 11/07/2023  7:44 AM  Site / Wound Assessment -- Painful;Red  Peri-wound Assessment -- Erythema (blanchable)  Drainage Amount -- None  Treatments Cleansed;Other (Comment) Cleansed  Dressing Type -- Foam - Lift dressing to assess site every shift  Dressing Changed -- Changed  Dressing Status -- Clean, Dry, Intact     No associated orders.     Wound 11/07/23 0750 Other (Comment) Foot Anterior;Right (Active)  Date First Assessed/Time First Assessed: 11/07/23 0750   Primary Wound Type: Other (Comment)  Location: (c) Foot  Location Orientation: Anterior;Right    Assessments 11/07/2023  7:44 AM  Site / Wound Assessment Pink;Painful  Peri-wound Assessment Erythema (blanchable)  Drainage Amount None  Dressing Type None     No associated orders.    Discharge Medications:   Allergies as of 11/07/2023       Reactions   Codeine Anaphylaxis, Hives, Swelling, Other (See Comments)   Swelling all over body and the throat    Clindamycin /lincomycin Nausea And Vomiting        Medication List     STOP taking these medications    insulin  glargine-yfgn 100 UNIT/ML Pen Commonly known as: SEMGLEE        TAKE these medications    (feeding supplement) PROSource Plus liquid Take 30 mLs by mouth 2 (two) times daily between meals. Start taking on: November 08, 2023   acetaminophen  500 MG tablet Commonly known as: TYLENOL  Take 2 tablets (1,000 mg total) by mouth every 6 (six) hours as needed for mild pain (pain score 1-3) or fever.   albuterol (2.5 MG/3ML) 0.083% nebulizer solution Commonly known as: PROVENTIL Take 3 mLs (2.5 mg total) by nebulization every 4 (four) hours as needed for wheezing or shortness of breath.   aspirin  EC 81 MG tablet Take 81 mg by mouth daily. Swallow whole.   b complex-vitamin c -folic acid  0.8 MG Tabs tablet Take 1 tablet by mouth in the morning.   cefTAZidime 1 g in sodium chloride  0.9 % 100 mL Inject 1 g into the vein 3 (three) times a week. Start taking on: November 08, 2023   cinacalcet  60 MG tablet Commonly known as: SENSIPAR  Take 60 mg by mouth every Tuesday, Thursday, and Saturday at 6 PM.  In the evening.   diphenhydramine -acetaminophen  25-500 MG Tabs tablet Commonly known as: TYLENOL  PM Take 1 tablet by mouth at bedtime as needed (Sleep/Pain).   gabapentin  100 MG capsule Commonly known as: NEURONTIN  TAKE 1 CAPSULE BY MOUTH THREE TIMES DAILY   HYDROcodone -acetaminophen  5-325 MG tablet Commonly known as: NORCO/VICODIN Take 1 tablet by mouth every 6 (six) hours as needed for severe pain (pain score 7-10).   insulin  aspart 100 UNIT/ML injection Commonly known as: novoLOG  Inject 0-6 Units into the skin 3 (three) times daily with meals.   insulin  aspart 100 UNIT/ML injection Commonly known as: novoLOG  Inject 4 Units into the skin 3 (three) times daily with meals.   Lantus  SoloStar 100 UNIT/ML Solostar Pen Generic drug: insulin  glargine Inject 50 Units into  the skin daily.   omeprazole  20 MG capsule Commonly known as: PRILOSEC Take 1 capsule by mouth once daily What changed: when to take this   polyethylene glycol 17 g packet Commonly known as: MIRALAX / GLYCOLAX Take 17 g by mouth daily as needed for moderate constipation.   rosuvastatin  10 MG tablet Commonly known as: CRESTOR  Take 1 tablet by mouth once daily   senna-docusate 8.6-50 MG tablet Commonly known as: Senokot-S Take 1 tablet by mouth 2 (two) times daily.   sevelamer  carbonate 800 MG tablet Commonly known as: RENVELA  Take 2 tablets (1,600 mg total) by mouth 3 (three) times daily with meals.   STOOL SOFTENER PO Take 1-2 tablets by mouth 2 (two) times daily as needed (Constipation).   vancomycin  750 MG/150ML Soln Commonly known as: VANCOREADY Inject 150 mLs (750 mg total) into the vein every Monday, Wednesday, and Friday with hemodialysis. Start taking on: November 08, 2023               Discharge Care Instructions  (From admission, onward)           Start     Ordered   11/07/23 0000  Discharge wound care:        11/07/23 1452             Follow ups:    Follow-up Information     Katrinka Garnette KIDD, MD Follow up.   Specialty: Family Medicine Contact information: [address] [place] [address] [phone]         Malvin Marsa FALCON, DPM Follow up.   Specialty: Podiatry Contact information: [address] [place] [address] [phone]         Luiz Channel, MD Follow up.   Specialty: Infectious Diseases Contact information: [address] [place] [address] [phone]                 Discharge Instructions:   Discharge Instructions     Call MD for:  difficulty breathing, headache or visual disturbances   Complete by: As directed    Call MD for:  extreme fatigue   Complete by: As directed    Call MD for:  hives   Complete by: As directed    Call MD  for:  persistant dizziness or light-headedness   Complete by: As directed    Call MD for:  persistant nausea and vomiting   Complete by: As directed    Call MD for:  severe uncontrolled pain   Complete by: As directed    Call MD for:  temperature >100.4   Complete by: As directed    Diet general   Complete by: As directed  Dialysis, carb modified diet   Discharge instructions   Complete by: As directed    Recommendations at discharge:   Per ID recommendation, 6 weeks of antibiotics with IV vancomycin  and IV ceftazidime to be given with dialysis, EOT 12/13/2023   Discharge instructions for diabetes mellitus: Check blood sugar 3 times a day and bedtime at home. If blood sugar running above 200 or less than 70 please call your MD to adjust insulin . If you notice signs and symptoms of hypoglycemia (low blood sugar) like jitteriness, confusion, thirst, tremor and sweating, please check blood sugar, drink sugary drink/biscuits/sweets to increase sugar level and call MD or return to ER.      PDMP reviewed this encounter.   Opioid taper instructions: It is important to wean off of your opioid medication as soon as possible. If you do not need pain medication after your surgery it is ok to stop day one. Opioids include: Codeine, Hydrocodone (Norco, Vicodin), Oxycodone (Percocet, oxycontin ) and hydromorphone  amongst others.  Long term and even short term use of opiods can cause: Increased pain response Dependence Constipation Depression Respiratory depression And more.  Withdrawal symptoms can include Flu like symptoms Nausea, vomiting And more Techniques to manage these symptoms Hydrate well Eat regular healthy meals Stay active Use relaxation techniques(deep breathing, meditating, yoga) Do Not substitute Alcohol to help with tapering If you have been on opioids for less than two weeks and do not have pain than it is ok to stop all together.  Plan to wean off of  opioids This plan should start within one week post op of your joint replacement. Maintain the same interval or time between taking each dose and first decrease the dose.  Cut the total daily intake of opioids by one tablet each day Next start to increase the time between doses. The last dose that should be eliminated is the evening dose.        General discharge instructions: Follow with Primary MD Katrinka Garnette KIDD, MD in 7 days  Please request your PCP  to go over your hospital tests, procedures, radiology results at the follow up. Please get your medicines reviewed and adjusted.  Your PCP may decide to repeat certain labs or tests as needed. Do not drive, operate heavy machinery, perform activities at heights, swimming or participation in water  activities or provide baby sitting services if your were admitted for syncope or siezures until you have seen by Primary MD or a Neurologist and advised to do so again.   Controlled Substance Reporting System database was reviewed. Do not drive, operate heavy machinery, perform activities at heights, swim, participate in water  activities or provide baby-sitting services while on medications for pain, sleep and mood until your outpatient physician has reevaluated you and advised to do so again.  You are strongly recommended to comply with the dose, frequency and duration of prescribed medications. Activity: As tolerated with Full fall precautions use walker/cane & assistance as needed Avoid using any recreational substances like cigarette, tobacco, alcohol, or non-prescribed drug. If you experience worsening of your admission symptoms, develop shortness of breath, life threatening emergency, suicidal or homicidal thoughts you must seek medical attention immediately by calling 911 or calling your MD immediately  if symptoms less severe. You must read complete instructions/literature along with all the possible adverse reactions/side effects  for all the medicines you take and that have been prescribed to you. Take any new medicine only after you have completely understood and accepted all the possible adverse reactions/side effects.  Wear Seat belts while driving. You were cared for by a hospitalist during your hospital stay. If you have any questions about your discharge medications or the care you received while you were in the hospital after you are discharged, you can call the unit and ask to speak with the hospitalist or the covering physician. Once you are discharged, your primary care physician will handle any further medical issues. Please note that NO REFILLS for any discharge medications will be authorized once you are discharged, as it is imperative that you return to your primary care physician (or establish a relationship with a primary care physician if you do not have one).   Discharge wound care:   Complete by: As directed    Increase activity slowly   Complete by: As directed        Discharge Exam:   Vitals:   11/06/23 1943 11/07/23 0509 11/07/23 0744 11/07/23 1041  BP: 115/60 (!) 114/55 105/66 (!) 91/55  Pulse: 68 81 87 88  Resp: (!) 25 17 17 18   Temp: 99.1 F (37.3 C) 97.8 F (36.6 C) 98.5 F (36.9 C) 98.4 F (36.9 C)  TempSrc: Oral Oral Oral Oral  SpO2: 100% 90% 97% 95%  Weight:      Height:        Body mass index is 31.78 kg/m.  General exam: Pleasant, middle-aged Caucasian male.  Not in distress Skin: No rashes, lesions or ulcers. HEENT: Atraumatic, normocephalic, no obvious bleeding Lungs: Clear to auscultation bilaterally,  CVS: S1, S2, no murmur,   GI/Abd: Soft, nontender, nondistended, bowel sound present,   CNS: Alert, awake, oriented x 3 Psychiatry: Mood appropriate Extremities: No pedal edema, no calf tenderness,    The results of significant diagnostics from this hospitalization (including imaging, microbiology, ancillary and laboratory) are listed below for reference.     Procedures and Diagnostic Studies:   VAS US  LOWER EXTREMITY VENOUS (DVT) (ONLY [facility] & [facility]) Result Date: 10/19/2023  Lower Venous DVT Study Patient Name:  DALBERT STILLINGS  Date of Exam:   10/19/2023 Medical Rec #: [id number]     Accession #:    [id number] Date of Birth: [birth year]     Patient Gender: M Patient Age:   63 years Exam Location:  [facility] Procedure:      VAS US  LOWER EXTREMITY VENOUS (DVT) Referring Phys: LONNI SAKAI --------------------------------------------------------------------------------  Indications: Swelling, Edema, Erythema, and ulceration.  Limitations: Body habitus and poor ultrasound/tissue interface. Comparison Study: Previous study on 4.24.2022. Performing Technologist: Edilia Elden Appl  Examination Guidelines: A complete evaluation includes B-mode imaging, spectral Doppler, color Doppler, and power Doppler as needed of all accessible portions of each vessel. Bilateral testing is considered an integral part of a complete examination. Limited examinations for reoccurring indications may be performed as noted. The reflux portion of the exam is performed with the patient in reverse Trendelenburg.  +-----+---------------+---------+-----------+----------+--------------+ RIGHTCompressibilityPhasicitySpontaneityPropertiesThrombus Aging +-----+---------------+---------+-----------+----------+--------------+ CFV  Full           Yes      Yes                                 +-----+---------------+---------+-----------+----------+--------------+ SFJ  Full           Yes      Yes                                 +-----+---------------+---------+-----------+----------+--------------+   +---------+---------------+---------+-----------+----------+--------------+  LEFT     CompressibilityPhasicitySpontaneityPropertiesThrombus Aging +---------+---------------+---------+-----------+----------+--------------+ CFV      Full           Yes      Yes                                  +---------+---------------+---------+-----------+----------+--------------+ SFJ      Full           Yes      Yes                                 +---------+---------------+---------+-----------+----------+--------------+ FV Prox  Full                                                        +---------+---------------+---------+-----------+----------+--------------+ FV Mid   Full                                                        +---------+---------------+---------+-----------+----------+--------------+ FV DistalFull                                                        +---------+---------------+---------+-----------+----------+--------------+ PFV      Full                                                        +---------+---------------+---------+-----------+----------+--------------+ POP      Full           Yes      Yes                                 +---------+---------------+---------+-----------+----------+--------------+ PTV      Full                                                        +---------+---------------+---------+-----------+----------+--------------+ PERO     Full                                                        +---------+---------------+---------+-----------+----------+--------------+    Summary: RIGHT: - No evidence of common femoral vein obstruction.   LEFT: - There is no evidence of deep vein thrombosis in the lower extremity.  - No cystic structure found in the popliteal fossa.  *See table(s) above for measurements and observations. Electronically signed by Lonni Gaskins MD  on 10/19/2023 at 2:26:06 PM.    Final    DG Foot Complete Left Result Date: 10/19/2023 CLINICAL DATA:  Sepsis.  Possible osteomyelitis. EXAM: LEFT FOOT - COMPLETE 3+ VIEW COMPARISON:  10/17/2023 FINDINGS: Mild diffuse decreased bone mineralization is present. Evidence of previous amputation distal to the base of the second  proximal phalanx unchanged. Degenerative changes over the midfoot and hindfoot as well as tibiotalar joint which are stable. Small vessel atherosclerotic disease is present. No evidence to suggest osteomyelitis. Minimal diffuse soft tissue swelling over the forefoot. IMPRESSION: 1. No acute findings. No evidence of osteomyelitis. 2. Degenerative changes as described. 3. Minimal diffuse soft tissue swelling over the forefoot. Electronically Signed   By: Toribio Agreste M.D.   On: 10/19/2023 09:13   DG Chest Port 1 View Result Date: 10/19/2023 CLINICAL DATA:  Questionable sepsis. EXAM: PORTABLE CHEST 1 VIEW COMPARISON:  04/14/2023 FINDINGS: Lungs are adequately inflated without focal airspace consolidation or effusion. Cardiomediastinal silhouette and remainder of the exam is unchanged. IMPRESSION: No active disease. Electronically Signed   By: Toribio Agreste M.D.   On: 10/19/2023 09:11     Labs:   Basic Metabolic Panel: Recent Labs  Lab 11/01/23 0205 11/01/23 2002 11/02/23 0220 11/03/23 0224 11/06/23 0755 11/07/23 1007  NA 134* 134* 135 135 130* 129*  K 5.5* 4.5 4.8 4.9 5.4* 5.0  CL 93* 90* 96* 92* 89* 90*  CO2 24 26 23 24 22 24   GLUCOSE 224* 133* 101* 126* 104* 187*  BUN 66* 33* 37* 57* 78* 56*  CREATININE 12.73* 8.01* 8.96* 11.40* 12.92* 10.73*  CALCIUM  9.2 9.7 9.1 9.4 9.9 9.7  PHOS 7.9*  --   --   --  8.1* 7.1*   GFR Estimated Creatinine Clearance: 7.8 mL/min (A) (by C-G formula based on SCr of 10.73 mg/dL (H)). Liver Function Tests: Recent Labs  Lab 11/01/23 0205 11/01/23 2002 11/06/23 0755 11/07/23 1007  AST  --  21  --   --   ALT  --  16  --   --   ALKPHOS  --  248*  --   --   BILITOT  --  1.1  --   --   PROT  --  8.3*  --   --   ALBUMIN 2.5* 2.9* 2.7* 2.7*   No results for input(s): LIPASE, AMYLASE in the last 168 hours. No results for input(s): AMMONIA in the last 168 hours. Coagulation profile Recent Labs  Lab 11/01/23 2002  INR 1.1    CBC: Recent Labs   Lab 11/01/23 0205 11/01/23 2002 11/02/23 0835 11/03/23 0224 11/06/23 0755  WBC 10.1 20.8* 14.4* 10.0 9.1  NEUTROABS 8.0* 19.0*  --   --   --   HGB 9.7* 11.3* 11.0* 10.0* 10.3*  HCT 30.1* 35.7* 34.1* 30.6* 30.6*  MCV 95.9 97.0 96.9 95.6 93.0  PLT 281 332 313 281 280   Cardiac Enzymes: No results for input(s): CKTOTAL, CKMB, CKMBINDEX, TROPONINI in the last 168 hours. BNP: Invalid input(s): POCBNP CBG: Recent Labs  Lab 11/06/23 1136 11/06/23 1553 11/06/23 2110 11/07/23 0548 11/07/23 1108  GLUCAP 87 150* 141* 84 191*   D-Dimer No results for input(s): DDIMER in the last 72 hours. Hgb A1c No results for input(s): HGBA1C in the last 72 hours. Lipid Profile No results for input(s): CHOL, HDL, LDLCALC, TRIG, CHOLHDL, LDLDIRECT in the last 72 hours. Thyroid  function studies No results for input(s): TSH, T4TOTAL, T3FREE, THYROIDAB in the last 72 hours.  Invalid input(s):  FREET3 Anemia work up No results for input(s): VITAMINB12, FOLATE, FERRITIN, TIBC, IRON, RETICCTPCT in the last 72 hours. Microbiology Recent Results (from the past 240 hours)  Culture, blood (Routine X 2) w Reflex to ID Panel     Status: None   Collection Time: 11/01/23  7:48 PM   Specimen: BLOOD  Result Value Ref Range Status   Specimen Description BLOOD SITE NOT SPECIFIED  Final   Special Requests   Final    BOTTLES DRAWN AEROBIC AND ANAEROBIC Blood Culture results may not be optimal due to an inadequate volume of blood received in culture bottles   Culture   Final    NO GROWTH 5 DAYS Performed at [facility], [address] [place], [place] [address]    Report Status 11/06/2023 FINAL  Final  Culture, blood (Routine X 2) w Reflex to ID Panel     Status: None   Collection Time: 11/01/23  7:57 PM   Specimen: BLOOD  Result Value Ref Range Status   Specimen Description BLOOD SITE NOT SPECIFIED  Final   Special Requests   Final    BOTTLES DRAWN  AEROBIC AND ANAEROBIC Blood Culture adequate volume   Culture   Final    NO GROWTH 5 DAYS Performed at [facility], [address], [place], [place] [address]    Report Status 11/06/2023 FINAL  Final    Time coordinating discharge: 45 minutes  Signed: Alfreida Steffenhagen  [facility] 11/07/2023, 2:53 PM

## 2023-11-07 NOTE — Progress Notes (Signed)
 Physical Therapy Treatment Patient Details Name: Edgar Brewer MRN: [id number] DOB: [birth year] Today's Date: 11/07/2023   History of Present Illness 63 y.o. M adm 10/19/23 with sepsis. 9/29 Lt transmet amp. 10/8 TEE with endocarditis and respiratory distress. PMHx: ESRD on HD MWF, HFpEF, T2DM, CVA, PAD, PAF, HTN    PT Comments  Pt EOB on arrival and end of session with mom present. Pt easily frustrated when discussing NWB status and options for mobility. With sequential cues and demonstration pt able to maintain NWB for short gait distance and perform HEP. Pt refused WC trials this date and educated that he may be NWB for another week per podiatry. Pt encouraged to maintain NWB for proper healing. Will continue to follow. Pt refused OOB to recliner.     If plan is discharge home, recommend the following: Help with stairs or ramp for entrance;Assist for transportation;Assistance with cooking/housework;A little help with walking and/or transfers;A little help with bathing/dressing/bathroom;Supervision due to cognitive status   Can travel by private vehicle     No  Equipment Recommendations  Wheelchair (measurements PT);Wheelchair cushion (measurements PT);Rolling walker (2 wheels)    Recommendations for Other Services       Precautions / Restrictions Precautions Precautions: Fall Recall of Precautions/Restrictions: Impaired Required Braces or Orthoses: Other Brace Other Brace: postop shoe Restrictions LLE Weight Bearing Per Provider Order: Non weight bearing     Mobility  Bed Mobility               General bed mobility comments: pt sitting EOB on arrival    Transfers Overall transfer level: Needs assistance   Transfers: Sit to/from Stand Sit to Stand: Contact guard assist           General transfer comment: Cues and demonstration for hand placement and LLE positioning for NWB with pt demonstrating understanding for rise but then rushing to sit and touching down with  LLE and sitting prematurely. pt able to scoot toward HOB and EOB with cues    Ambulation/Gait Ambulation/Gait assistance: Contact guard assist Gait Distance (Feet): 10 Feet Assistive device: Rolling walker (2 wheels) Gait Pattern/deviations: Step-to pattern   Gait velocity interpretation: <1.31 ft/sec, indicative of household ambulator   General Gait Details: pt needing mod cues to keep RW close, keep NWb status and regulate distance. Pt maintained NWB throughout gait trial this session and encouraged mom to bring Rt shoe to decrease impact.   Stairs             Education officer, museum Details (indicate cue type and reason): pt denied attempting WC use this session   Tilt Bed    Modified Rankin (Stroke Patients Only)       Balance Overall balance assessment: Needs assistance Sitting-balance support: No upper extremity supported, Feet supported Sitting balance-Leahy Scale: Good Sitting balance - Comments: EOB without support   Standing balance support: Bilateral upper extremity supported, Reliant on assistive device for balance Standing balance-Leahy Scale: Poor Standing balance comment: Rw in standing for NWB status                            Communication Communication Communication: No apparent difficulties  Cognition Arousal: Alert Behavior During Therapy: Flat affect   PT - Cognitive impairments: Problem solving, Sequencing, Awareness, Safety/Judgement, Memory                       PT - Cognition Comments: pt needing  step by step multimodal cues with repetition throughout session for transfers and gait. Pt more calm and receptive with mom present this session Following commands: Intact Following commands impaired: Follows one step commands with increased time    Cueing Cueing Techniques: Verbal cues, Gestural cues  Exercises General Exercises - Lower Extremity Long Arc Quad: AROM, Left, Seated,  Strengthening, 20 reps Hip Flexion/Marching: AROM, Left, Seated, Strengthening, 20 reps    General Comments        Pertinent Vitals/Pain Pain Assessment Pain Assessment: No/denies pain    Home Living                          Prior Function            PT Goals (current goals can now be found in the care plan section) Acute Rehab PT Goals Time For Goal Achievement: 11/21/23 Potential to Achieve Goals: Fair Progress towards PT goals: Progressing toward goals    Frequency           PT Plan      Co-evaluation              AM-PAC PT 6 Clicks Mobility   Outcome Measure  Help needed turning from your back to your side while in a flat bed without using bedrails?: A Little Help needed moving from lying on your back to sitting on the side of a flat bed without using bedrails?: A Little Help needed moving to and from a bed to a chair (including a wheelchair)?: A Little Help needed standing up from a chair using your arms (e.g., wheelchair or bedside chair)?: A Little Help needed to walk in hospital room?: A Lot Help needed climbing 3-5 steps with a railing? : Total 6 Click Score: 15    End of Session Equipment Utilized During Treatment: Gait belt Activity Tolerance: Patient tolerated treatment well Patient left: in bed;with call bell/phone within reach;with family/visitor present (EOB with mom present) Nurse Communication: Mobility status;Precautions;Weight bearing status PT Visit Diagnosis: Unsteadiness on feet (R26.81);Other abnormalities of gait and mobility (R26.89);Muscle weakness (generalized) (M62.81)     Time: 8843-8787 PT Time Calculation (min) (ACUTE ONLY): 16 min  Charges:    $Therapeutic Activity: 8-22 mins PT General Charges $$ ACUTE PT VISIT: 1 Visit                     Lenoard SQUIBB, PT Acute Rehabilitation Services Office: [phone]    Clearnce Leja B Kelda Azad 11/07/2023, 1:01 PM

## 2023-11-07 NOTE — Progress Notes (Signed)
 PROGRESS NOTE  Edgar Brewer  DOB: 09-30-1960  PCP: Katrinka Garnette KIDD, MD FMW:[id number]  DOA: 10/19/2023  LOS: 19 days  Hospital Day: 20  Subjective: Patient was seen and examined this morning. Middle-aged Caucasian male.  Lying on bed.  No acute distress.  No new symptoms. Pending SNF Chart reviewed. Afebrile, hemodynamically stable, breathing room air  Brief narrative: Edgar Brewer is a 63 y.o. male with PMH significant for ESRD-HD-MWF, DM2, HTN, CKD, stroke, GERD. Patient was recently hospitalized for amputation of L 2nd toe through proximal phalanx.   9/25, patient presented with complaint of purulent discharge from the left second toe, as well as, chills.   On presentation he was febrile, tachycardic, tachypneic, requiring 2 L of oxygen.  Labs revealed potassium 5.2, creatinine 5.7, lactic acidosis 5.6. XR L foot w/o acute findings or OM. Started on broad-spectrum IV antibiotics Patient was admitted to [facility] for further management of sepsis secondary to left foot wound infection.   Podiatry and vascular surgery teams were consulted.   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  It showed 1.1 cm calcified mobile echodensity on the anterior mitral valve leaflet.  10/8, TEE confirmed endocarditis.   ID was consulted.  Blood cultures have been negative.   Infectious disease team has advised 6  weeks of antibiotics with IV vancomycin  and IV ceftazidime to be given with dialysis, EOT 12/13/2023   Bradycardia secondary heart block Became bradycardia on 10/28/2023.  EKG revealed second-degree heart block, Mobitz type I/Wenckebach.   Heart rate stabilized without atropine or temporary pacemaker. TSH normal. Echo 10/7 with infective endocarditis as above.  Showed EF 60 to 65%, normal RV function   Type 2 diabetes mellitus Diabetic neuropathy A1c 6.3 10/19/2023 Currently blood sugar levels controlled on Lantus  50 units daily, SSI/Accu-Cheks Continue gabapentin  100 mg 3 times daily Recent Labs  Lab 11/06/23 1136 11/06/23 1553 11/06/23 2110 11/07/23 0548 11/07/23 1108  GLUCAP 87 150* 141* 84 191*   ESRD-HD-MWF Nephrology following for dialysis.   Continue Renvela , Sensipar .  Dialyzed on schedule.  Hyponatremia Mild chronic related to dialysis status.  Continue to monitor Recent Labs  Lab 11/01/23 0205 11/01/23 2002 11/02/23 0220 11/03/23 0224 11/06/23 0755 11/07/23 1007  NA 134* 134* 135 135 130* 129*   Peripheral artery disease Continue aspirin  81 mg daily, Crestor  10 mg daily Follows-up with vascular surgery as an outpatient.   Constipation Continue bowel regimen with Senokot twice daily, MiraLAX daily as needed   Obesity 1 Body mass index is 31.78 kg/m. Patient has been advised to make an attempt to improve diet and exercise patterns to aid in weight loss.  Impaired mobility PT recommended SNF on discharge.   Waiting for bed availability.  TOC following    PT Orders: Active   PT Follow up Rec: Skilled Nursing-Short Term Rehab (<3 Hours/Day)11/07/2023 1259  Goals of care   Code Status: Full Code     DVT prophylaxis:  heparin  injection 5,000 Units Start: 10/26/23 0915   Antimicrobials: IV  vancomycin , IV ceftazidime Fluid: None Consultants: ID Family Communication: None at bedside  Status: Inpatient Level of care: Currently progressive.  Can  transfer to Telemetry Cardiac   Patient is from: Home Needs to continue in-hospital care: Pending SNF authorization.  Medically stable    Diet:  Diet Order             Diet renal with fluid restriction Fluid restriction: 1200 mL Fluid; Room service appropriate? Yes; Fluid consistency: Thin  Diet effective now                   Scheduled Meds:  (feeding supplement) PROSource Plus  30 mL Oral BID BM   aspirin  EC  81 mg Oral Daily   Chlorhexidine  Gluconate Cloth  6 each Topical Q0600   cinacalcet   60 mg Oral Q T,Th,Sat-1800   [START ON 11/10/2023] darbepoetin (ARANESP ) injection - DIALYSIS  60 mcg Subcutaneous Q Fri-1800   gabapentin   100 mg Oral TID   heparin  injection (subcutaneous)  5,000 Units Subcutaneous Q8H   insulin  aspart  0-6 Units Subcutaneous TID WC   insulin  aspart  4 Units Subcutaneous TID WC   insulin  glargine  50 Units Subcutaneous Daily   multivitamin  1 tablet Oral QHS   mupirocin  cream   Topical QODAY   pantoprazole   40 mg Oral Daily   rosuvastatin   10 mg Oral Daily   senna-docusate  1 tablet Oral BID   sevelamer  carbonate  1,600 mg Oral TID WC    PRN meds: acetaminophen  **OR** acetaminophen , albuterol, HYDROcodone -acetaminophen , ondansetron  (ZOFRAN ) IV, mouth rinse, polyethylene glycol   Infusions:   cefTAZidime (FORTAZ)  IV 1 g (11/06/23 1638)   [START ON 11/08/2023] vancomycin       Antimicrobials: Anti-infectives (From admission, onward)    Start     Dose/Rate Route Frequency Ordered Stop   11/08/23 1200  vancomycin  (VANCOCIN ) IVPB 1000 mg/200 mL premix  Status:  Discontinued        1,000 mg 200 mL/hr over 60 Minutes Intravenous Every M-W-F (Hemodialysis) 11/06/23 1216 11/06/23 1319   11/08/23 1200  vancomycin  (VANCOREADY) IVPB 750 mg/150 mL        750 mg 150 mL/hr over 60 Minutes Intravenous Every M-W-F (Hemodialysis) 11/06/23 1319     11/06/23 1600  cefTAZidime (FORTAZ) 1 g in sodium chloride  0.9 % 100 mL IVPB        1 g 200 mL/hr over  30 Minutes Intravenous Once per day on Monday Wednesday Friday 11/04/23 1414 11/13/23 1559   11/06/23 1415  vancomycin  (VANCOREADY) IVPB 750 mg/150 mL        750 mg 150 mL/hr over 60 Minutes Intravenous  Once 11/06/23 1319 11/06/23 1623   11/06/23 1315  vancomycin  (VANCOCIN ) IVPB 1000 mg/200 mL premix  Status:  Discontinued        1,000 mg 200 mL/hr over 60 Minutes Intravenous  Once 11/06/23 1216 11/06/23 1319   11/03/23 1830  levofloxacin (LEVAQUIN) IVPB 500 mg  Status:  Discontinued       Placed in Followed by Linked Group   500 mg 100 mL/hr over 60 Minutes Intravenous Every 48 hours 11/01/23 1617 11/04/23 1336   11/03/23 1200  vancomycin  (VANCOCIN ) IVPB 1000 mg/200 mL premix  Status:  Discontinued        1,000 mg 200 mL/hr over 60 Minutes Intravenous Every M-W-F (Hemodialysis) 11/01/23 1617 11/02/23 0731   11/02/23 0731  vancomycin  variable dose per unstable renal function (pharmacist dosing)  Status:  Discontinued  Does not apply See admin instructions 11/02/23 0731 11/06/23 1319   11/01/23 1830  vancomycin  (VANCOREADY) IVPB 2000 mg/400 mL        2,000 mg 200 mL/hr over 120 Minutes Intravenous Once 11/01/23 1608 11/01/23 2222   11/01/23 1830  levofloxacin (LEVAQUIN) IVPB 750 mg       Placed in Followed by Linked Group   750 mg 100 mL/hr over 90 Minutes Intravenous  Once 11/01/23 1617 11/01/23 2130   10/27/23 2200  amoxicillin -clavulanate (AUGMENTIN ) 500-125 MG per tablet 1 tablet        1 tablet Oral Daily at bedtime 10/27/23 0849 10/31/23 2203   10/27/23 1000  amoxicillin -clavulanate (AUGMENTIN ) 500-125 MG per tablet 1 tablet  Status:  Discontinued        1 tablet Oral Every 12 hours 10/26/23 2155 10/27/23 0849   10/26/23 2245  amoxicillin -clavulanate (AUGMENTIN ) 500-125 MG per tablet 1 tablet  Status:  Discontinued        1 tablet Oral Every 12 hours 10/26/23 2154 10/26/23 2155   10/23/23 1800  vancomycin  (VANCOCIN ) IVPB 1000 mg/200 mL premix  Status:  Discontinued         1,000 mg 200 mL/hr over 60 Minutes Intravenous Every M-W-F (Hemodialysis) 10/23/23 1457 10/27/23 0846   10/20/23 1800  ceFEPIme  (MAXIPIME ) 1 g in sodium chloride  0.9 % 100 mL IVPB  Status:  Discontinued        1 g 200 mL/hr over 30 Minutes Intravenous Every 24 hours 10/19/23 1311 10/26/23 2154   10/20/23 1200  vancomycin  (VANCOCIN ) IVPB 1000 mg/200 mL premix  Status:  Discontinued        1,000 mg 200 mL/hr over 60 Minutes Intravenous Every M-W-F (Hemodialysis) 10/20/23 0725 10/23/23 1457   10/19/23 2200  metroNIDAZOLE  (FLAGYL ) tablet 500 mg  Status:  Discontinued        500 mg Oral Every 12 hours 10/19/23 1235 10/26/23 2154   10/19/23 1310  vancomycin  variable dose per unstable renal function (pharmacist dosing)  Status:  Discontinued         Does not apply See admin instructions 10/19/23 1311 10/20/23 0725   10/19/23 0845  ceFEPIme  (MAXIPIME ) 2 g in sodium chloride  0.9 % 100 mL IVPB        2 g 200 mL/hr over 30 Minutes Intravenous  Once 10/19/23 0839 10/19/23 1347   10/19/23 0845  metroNIDAZOLE  (FLAGYL ) IVPB 500 mg        500 mg 100 mL/hr over 60 Minutes Intravenous  Once 10/19/23 0839 10/19/23 1519   10/19/23 0845  vancomycin  (VANCOCIN ) IVPB 1000 mg/200 mL premix  Status:  Discontinued        1,000 mg 200 mL/hr over 60 Minutes Intravenous  Once 10/19/23 0839 10/19/23 0839   10/19/23 0845  vancomycin  (VANCOREADY) IVPB 2000 mg/400 mL        2,000 mg 200 mL/hr over 120 Minutes Intravenous STAT 10/19/23 0839 10/19/23 1207       Objective: Vitals:   11/07/23 0744 11/07/23 1041  BP: 105/66 (!) 91/55  Pulse: 87 88  Resp: 17 18  Temp: 98.5 F (36.9 C) 98.4 F (36.9 C)  SpO2: 97% 95%    Intake/Output Summary (Last 24 hours) at 11/07/2023 1305 Last data filed at 11/07/2023 1000 Gross per 24 hour  Intake 560 ml  Output --  Net 560 ml   Filed Weights   11/03/23 0727 11/03/23 1112 11/06/23 0743  Weight: 92.4 kg 91.2 kg 94.8 kg   Weight change:  Body  mass index is 31.78  kg/m.   Physical Exam: General exam: Pleasant, middle-aged Caucasian male.  Not in distress Skin: No rashes, lesions or ulcers. HEENT: Atraumatic, normocephalic, no obvious bleeding Lungs: Clear to auscultation bilaterally,  CVS: S1, S2, no murmur,   GI/Abd: Soft, nontender, nondistended, bowel sound present,   CNS: Alert, awake, oriented x 3 Psychiatry: Mood appropriate Extremities: No pedal edema, no calf tenderness,   Data Review: I have personally reviewed the laboratory data and studies available.  F/u labs ordered Unresulted Labs (From admission, onward)     Start     Ordered   11/08/23 0500  Basic metabolic panel with GFR  Tomorrow morning,   R       Question:  Specimen collection method  Answer:  Lab=Lab collect   11/07/23 1305   11/08/23 0500  CBC with Differential/Platelet  Tomorrow morning,   R       Question:  Specimen collection method  Answer:  Lab=Lab collect   11/07/23 1305            Signed, Chapman Rota, MD [facility] 11/07/2023

## 2023-11-08 NOTE — Discharge Planning (Signed)
 [facility] Patient Discharge Orders- [facility] CLINIC: [place]- DC to SNF  Patient's name: Edgar Brewer Admit/DC Dates: 10/19/2023 - 11/07/2023  Discharge Diagnoses: L foot infection - s/p amputation of L 2nd toe through proximal phalanx 9/29 then underwent TMA L foot MV endocarditis - conformed by TEE, see below on ABX plan Bradycradia - EKG revealed second-degree heart block, Mobitz type I/Wenckebach. HR stabilized without atropine or temporary pacemaker.   Aranesp : Given: No     Last Hgb: 10.3 PRBC's Given: No  ESA dose for discharge: Start mircera per protocol if Hgb drops below 10.  IV Iron dose at discharge: N/A  Heparin  change: No  EDW Change: Yes New EDW: Lower to 93kg. Appears weights here were below his EDW. Notify renal team if he doesn't tolerate this new EDW.  Bath Change: No  Access intervention/Change: No  Calcitriol change: No  Discharge Labs: Calcium  9.7  Phosphorus 7.1  Albumin 2.7  K+ 5.0  IV Antibiotics: Yes Details: MV endocarditis:  Vanc 750mg  IV and Ceftazidime 1GM IV with HD with end date of therapy to be 11/19   On Coumadin?: No   OTHER/APPTS/LAB ORDERS:  Please check K+ level pre-HD at next tx    D/C Meds to be reconciled by nurse after every discharge.  Completed By: Charmaine Piety, NP   Reviewed by: MD:______ RN_______

## 2023-11-13 ENCOUNTER — Other Ambulatory Visit: Payer: Self-pay | Admitting: Family Medicine

## 2023-11-14 ENCOUNTER — Ambulatory Visit (INDEPENDENT_AMBULATORY_CARE_PROVIDER_SITE_OTHER): Payer: Medicare (Managed Care) | Admitting: Podiatry

## 2023-11-14 ENCOUNTER — Encounter: Payer: Self-pay | Admitting: Podiatry

## 2023-11-14 DIAGNOSIS — Z9889 Other specified postprocedural states: Secondary | ICD-10-CM

## 2023-11-14 DIAGNOSIS — I739 Peripheral vascular disease, unspecified: Secondary | ICD-10-CM

## 2023-11-14 DIAGNOSIS — M86272 Subacute osteomyelitis, left ankle and foot: Secondary | ICD-10-CM

## 2023-11-14 NOTE — Progress Notes (Signed)
  Subjective:  Patient ID: Edgar Brewer, male    DOB: [birth year],  MRN: [id number]  Chief Complaint  Patient presents with   Routine Post Op    POV# 1 - DOS: 10/23/23 Transmetatarsal amputation, left foot. 0 pain. Vancomycin  I.V. Right foot great toe wound from blister.     DOS: 10/23/23 Procedure: 1.  Transmetatarsal amputation, left foot   63 y.o. male seen for post op check.  Patient presenting approximately 3 weeks out from left foot transmetatarsal amputation.  Patient is getting IV antibiotics with dialysis.  He is now out of the rehab facility.  Getting appropriate dressing changes as ordered at the facility.  Denies pain.  Also has superficial wounds to the right foot without drainage  Review of Systems: Negative except as noted in the HPI. Denies N/V/F/Ch.   Objective:   Constitutional Well developed. Well nourished.  Vascular Foot warm and well perfused. Capillary refill normal to all digits.   No calf pain with palpation  Neurologic Normal speech. Oriented to person, place, and time. Epicritic sensation diminished to left foot  Dermatologic Amputation site well coapted though with central area of maceration and mild erythema no gross dehiscence no purulence  Right foot with superficial necrotic eschar overlying the right great toe without evidence of erythema or drainage.  Right dorsal lateral foot wound appears healthy with no drainage     Orthopedic: S/p L foot TMA   Radiographs: Interval transmetatarsal amputation of the first through fifth digits.  Pathology:  A. FOOT, LEFT, TRANSMETATARSAL AMPUTATION:       Chronic osteomyelitis.       Subcutaneous tissue with congestion.       Skin ulcer and necrosis.       Bone margin is viable without evidence of acute osteomyelitis or  necrosis.   Micro: Rare GNR  Assessment:   1. Subacute osteomyelitis of left foot (HCC)   2. PAD (peripheral artery disease)   3. Post-operative state    Osteomyelitis and gangrene  left foot s/p TMA  Plan:  Patient was evaluated and treated and all questions answered.  3 weeks s/p TMA left foot -Progressing with concern for superficial amputation site infection/maceration but no gross dehiscence. -Recommend we proceed with daily Betadine wet-to-dry dressing changes to left foot TMA site. -Discussed that if he has worsening infection or full dehiscence he is at risk for limb loss on the left lower extremity. -XR: expected changes -WB Status: NWB in post op shoe LLE  -Sutures: remain intact for another week -Medications/ABX: On long-term IV antibiotics per ID -Dressing: Changed today, recommend daily dressing change with Betadine wet-to-dry - F/u Plan: Patient to follow-up in 1 week        [name], DPM [facility] / [facility]

## 2023-11-16 ENCOUNTER — Encounter: Payer: Self-pay | Admitting: Family Medicine

## 2023-11-16 ENCOUNTER — Ambulatory Visit (INDEPENDENT_AMBULATORY_CARE_PROVIDER_SITE_OTHER): Payer: Medicare (Managed Care) | Admitting: Family Medicine

## 2023-11-16 ENCOUNTER — Encounter (HOSPITAL_COMMUNITY): Payer: Self-pay

## 2023-11-16 VITALS — BP 122/62 | HR 83 | Temp 98.1°F | Ht 68.0 in | Wt 207.6 lb

## 2023-11-16 DIAGNOSIS — I059 Rheumatic mitral valve disease, unspecified: Secondary | ICD-10-CM | POA: Diagnosis not present

## 2023-11-16 DIAGNOSIS — N186 End stage renal disease: Secondary | ICD-10-CM

## 2023-11-16 DIAGNOSIS — E118 Type 2 diabetes mellitus with unspecified complications: Secondary | ICD-10-CM

## 2023-11-16 DIAGNOSIS — I5032 Chronic diastolic (congestive) heart failure: Secondary | ICD-10-CM

## 2023-11-16 DIAGNOSIS — Z992 Dependence on renal dialysis: Secondary | ICD-10-CM

## 2023-11-16 DIAGNOSIS — M86272 Subacute osteomyelitis, left ankle and foot: Secondary | ICD-10-CM

## 2023-11-16 DIAGNOSIS — I10 Essential (primary) hypertension: Secondary | ICD-10-CM

## 2023-11-16 NOTE — Progress Notes (Signed)
 Phone [phone] In person visit   Subjective:   Eleazar Kimmey is a 63 y.o. year old very pleasant male patient who presents for/with See problem oriented charting Chief Complaint  Patient presents with   Hospitalization Follow-up    Will do handicap placard today;     Past Medical History-  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    Priority: High   PAD (peripheral artery disease) 10/20/2023    Priority: High   History of stroke 02/25/2020    Priority: High   ESRD on dialysis (HCC) 10/03/2018    Priority: High   Diabetes mellitus type 2 with complications (HCC) 01/14/2015    Priority: High   Chronic diastolic CHF (congestive heart failure) (HCC) 01/13/2015    Priority: High   Fatty liver 02/10/2022    Priority: Medium    GERD (gastroesophageal reflux disease) 03/25/2020    Priority: Medium    Aortic stenosis 02/25/2020    Priority: Medium    Type 2 diabetes mellitus with diabetic neuropathy, unspecified (HCC) 02/25/2020    Priority: Medium    Gallbladder abscess 11/15/2018    Priority: Medium    Elevated LFTs 10/18/2018    Priority: Medium    Other disorders of phosphorus metabolism 01/19/2018    Priority: Medium    Secondary hyperparathyroidism of renal origin 09/27/2017    Priority: Medium    Essential hypertension 01/14/2015    Priority: Medium    Anemia of chronic kidney failure 01/14/2015    Priority: Medium    Varicose veins of lower extremities with complications 12/24/2014    Priority: Medium    Wound of left leg 05/08/2020    Priority: Low   Left leg cellulitis 05/08/2020    Priority: Low   CKD (chronic kidney disease), stage IV (HCC) 01/14/2015    Priority: Low   Obesity 01/14/2015    Priority: Low   Chronic venous insufficiency 10/22/2014    Priority: Low   Abnormal echocardiogram 10/31/2023   Bradycardia 10/30/2023   Pyogenic inflammation of bone (HCC) 10/20/2023   Neuropathy    Sepsis  (HCC) 10/19/2023   Hyperkalemia 09/20/2023   Hyponatremia 09/20/2023   Gangrene of left foot (HCC) 09/19/2023    Medications- reviewed and updated Current Outpatient Medications  Medication Sig Dispense Refill   aspirin  EC 81 MG tablet Take 81 mg by mouth daily. Swallow whole.     b complex-vitamin c -folic acid  (NEPHRO-VITE) 0.8 MG TABS tablet Take 1 tablet by mouth in the morning.     cefTAZidime 1 g in sodium chloride  0.9 % 100 mL Inject 1 g into the vein 3 (three) times a week.     cinacalcet  (SENSIPAR ) 60 MG tablet Take 60 mg by mouth every Tuesday, Thursday, and Saturday at 6 PM. In the evening.     diphenhydramine -acetaminophen  (TYLENOL  PM) 25-500 MG TABS tablet Take 1 tablet by mouth at bedtime as needed (Sleep/Pain).     Docusate Calcium  (STOOL SOFTENER PO) Take 1-2 tablets by mouth 2 (two) times daily as needed (Constipation).     gabapentin  (NEURONTIN ) 100 MG capsule TAKE 1 CAPSULE BY MOUTH THREE TIMES DAILY 270 capsule 3   LANTUS  SOLOSTAR 100 UNIT/ML Solostar Pen Inject 50 Units into the skin daily.     omeprazole  (PRILOSEC) 20 MG capsule Take 1 capsule by mouth once daily 90 capsule 0   rosuvastatin  (CRESTOR ) 10 MG tablet Take 1 tablet by mouth once daily 90 tablet 0  sevelamer  carbonate (RENVELA ) 800 MG tablet Take 2 tablets (1,600 mg total) by mouth 3 (three) times daily with meals.     vancomycin  (VANCOREADY) 750 MG/150ML SOLN Inject 150 mLs (750 mg total) into the vein every Monday, Wednesday, and Friday with hemodialysis.     No current facility-administered medications for this visit.     Objective:  BP 122/62 (BP Location: Left Arm, Patient Position: Sitting, Cuff Size: Normal)   Pulse 83   Temp 98.1 F (36.7 C) (Temporal)   Ht 5' 8 (1.727 m)   Wt 207 lb 9.6 oz (94.2 kg)   SpO2 97%   BMI 31.57 kg/m  Gen: NAD, resting comfortably CV: RRR no murmurs rubs or gallops Lungs: CTAB no crackles, wheeze, rhonchi Ext: Trace imitation, left foot in walking boot with  transmetatarsal amputation noted Skin: warm, dry     Assessment and Plan   # Hospital follow-up for osteomyelitis now status post transmetatarsal amputation left foot and for endocarditis S: Prior to most recent hospitalization on 10/19/2023 patient had recently been admitted for amputation of second toe ray due to osteomyelitis osteomyelitis continue to progress and he presented with purulent discharge from the left second toe as well as chills he was febrile, tachycardic, tachypneic and required 2 L oxygen.  He was started on broad-spectrum antibiotics and admitted due to infection-podiatry and vascular surgery were consulted while he was being treated for sepsis.  On September 29 he underwent transmetatarsal amputation of the left foot  Hospital course complicated by culture-negative infective endocarditis on 6-week course of antibiotics through skilled nursing facility with IV vancomycin  and ceftazidime with dialysis with last date of treatment 12/13/2023.  For the first 2-week he was to be nonweightbearing  Patient also had secondary heart block Mobitz type I and had significant bradycardia 10/28/2023 but thankfully heart rate stabilized and he did not require atropine or temporary pacemaker.  Echocardiogram 10 7 showed infective endocarditis with ejection fraction 60 to 65% and normal RV function  Hospitalization was prolonged with discharge 11/07/2023. He was at [facility] but not a good experience and came out on Saturday. He is set up for antibiotics for ongoing treatment of mitral valve endocarditis.   Currently he reports he is trying to stay off the foot is much as possible.  He is taking the antibiotics with dialysis.  He was not aware of the endocarditis issue and we went over this today and that he was on antibiotics not only for the osteomyelitis but also for the endocarditis.  No fever or chills.  He is having some drainage from his foot but working closely with podiatry on this-they  have discussed possibility of BKA and we did as well today A/P: Endocarditis on appropriate treatment with vancomycin  and ceftazidime with dialysis for 6 weeks.  Already has infectious disease follow-up on November 6.  Cardiology also has wanted to do repeat echocardiogram-hoping they will reach out to patient-if not we can help facilitate  For osteomyelitis-working closely with podiatry but I do have some concern that he may end up needing BKA-we went over this risk today and thankfully this has already been discussed by podiatry as well     #End-stage renal disease on hemodialysis since January 2019. thorugh related to diabetes #Secondary hyperparathyroidism S: Patient on dialysis followed by Dr. Dolan on MWF at [facility] site  -AV fistula 06/01/2016 by Dr. Oris A/P: Stable with dialysis-continue close follow-up with nephrology -Patient compliant with Sensipar  for secondary hyperparathyroidism-continue -On Renvela  phosphate  binder-continue  #Heart failure-follows with Dr. Delford S: Medication: lasix  in past, now managed by dialysis  Remains in bed you Edema: No increased Weight gain: Weights followed closely and adjusted with dialysis  A/P: Appears euvolemic with dialysis-continue current treatment  #hypertension S: medication: None A/P: Off blood pressure medicine on dialysis-continue to monitor  #Aortic stenosis-stable as mild in hospital    # Diabetes with neuropathy S: Medication:basaglar  or lantus  50 units (this was maintained in the hospital along with sliding scale insulin ), posthospitalization they recommended NovoLog  as well but he had not been on that before and may have simply been essentially sliding scale continued  gabapentin  300mg  daily limit per dialysis-going to try 100 mg 3 times a day- no sedation thankfully  Lab Results  Component Value Date   HGBA1C 6.3 (H) 10/19/2023   HGBA1C 7.0 (A) 06/15/2023   HGBA1C 7.1 (A) 02/07/2023   A/P: Diabetes with neuropathy-A1c  has been well-controlled-will have him back in 3 months Neuropathy overall stable-continue current medication but certainly contributes to amputation risk  #history of stroke- now on aspirin  81mg - stutter lingers from this #hyperlipidemia S: Medication: in 2023 back on rosuvastatin  10 mg  Lab Results  Component Value Date   CHOL 117 09/21/2023   HDL 32 (L) 09/21/2023   LDLCALC 31 09/21/2023   LDLDIRECT 41.0 07/06/2021   TRIG 268 (H) 09/21/2023   CHOLHDL 3.7 09/21/2023   A/P: LDL at goal-triglycerides above goal-would encourage work on healthy eating and regular exercise but right now the focus is getting through this foot issue  #Varicose veins/venous insufficiency-also PAD-upcoming visit with vascular surgery  # Atrial fibrillation-I do not see any evidence of this in the hospitalization-appears to have potentially erroneously been added on 10/31/23 as no evidence of discussion of anticoagulants for instance  Recommended follow up: Return in about 3 months (around 02/16/2024) for followup or sooner if needed.Schedule b4 you leave. Future Appointments  Date Time Provider Department Center  11/21/2023  3:30 PM Malvin Marsa JULIANNA ROSENA [facility]  11/23/2023 12:30 PM [facility]  11/23/2023  1:30 PM [facility]  11/30/2023  1:00 PM Luiz Channel, MD [facility]  02/27/2024  3:00 PM Katrinka Garnette KIDD, MD [facility]    Lab/Order associations:   ICD-10-CM   1. Subacute osteomyelitis of left foot (HCC)  M86.272     2. Endocarditis of mitral valve  I05.9     3. ESRD on dialysis (HCC)  N18.6    Z99.2     4. Chronic diastolic CHF (congestive heart failure) (HCC)  I50.32     5. Essential hypertension  I10     6. Diabetes mellitus type 2 with complications (HCC)  E11.8      Return precautions advised.  Garnette Katrinka, MD

## 2023-11-16 NOTE — Patient Instructions (Addendum)
 Hoping for good results for the foot and keeping infection at bay  No changes today  Recommended follow up: Return in about 3 months (around 02/16/2024) for followup or sooner if needed.Schedule b4 you leave.

## 2023-11-16 NOTE — Assessment & Plan Note (Signed)
 This appears to have been added by error-I do not see any actual documentation of atrial fibrillation in any the notes.  I am going to remove/delete this

## 2023-11-21 ENCOUNTER — Encounter: Payer: Self-pay | Admitting: Podiatry

## 2023-11-21 ENCOUNTER — Encounter (HOSPITAL_COMMUNITY): Payer: Self-pay

## 2023-11-21 ENCOUNTER — Ambulatory Visit (INDEPENDENT_AMBULATORY_CARE_PROVIDER_SITE_OTHER): Payer: Medicare (Managed Care) | Admitting: Podiatry

## 2023-11-21 DIAGNOSIS — Z9889 Other specified postprocedural states: Secondary | ICD-10-CM

## 2023-11-21 DIAGNOSIS — M86272 Subacute osteomyelitis, left ankle and foot: Secondary | ICD-10-CM

## 2023-11-21 DIAGNOSIS — L97512 Non-pressure chronic ulcer of other part of right foot with fat layer exposed: Secondary | ICD-10-CM

## 2023-11-21 DIAGNOSIS — I739 Peripheral vascular disease, unspecified: Secondary | ICD-10-CM

## 2023-11-21 NOTE — Progress Notes (Signed)
  Subjective:  Patient ID: Edgar Brewer, male    DOB: [birth year],  MRN: [id number]  Chief Complaint  Patient presents with   Routine Post Op    Transmetatarsal amputation, left foot iodine  dressing. 0 pain. Vancomycin  I.V. Right foot great toe anterior wound from blister. Using triple antibiotic. R1 macerated.    DOS: 10/23/23 Procedure: 1.  Transmetatarsal amputation, left foot   63 y.o. male seen for post op check.  Patient presenting approximately 4 weeks out from left foot transmetatarsal amputation.  Patient is getting IV antibiotics with dialysis.   Also has wound on right great toe.  Has been using Betadine on the left foot TMA site.  Using antibiotic ointment on right foot reports daily dressing change per family.  Walking in postop shoe bilaterally.  Review of Systems: Negative except as noted in the HPI. Denies N/V/F/Ch.   Objective:   Constitutional Well developed. Well nourished.  Vascular Foot warm and well perfused. Capillary refill normal to all digits.   No calf pain with palpation  Neurologic Normal speech. Oriented to person, place, and time. Epicritic sensation diminished to left foot  Dermatologic Left foot amputation site well coapted, decreased eschar from prior overall improved healing there is some superficial necrotic areas and superficial ulceration at the lateral aspect of the amputation line however the majority is well-healed.   Right foot great toe has dorsal ulceration with some fibrotic eschar present in the wound bed but no evidence of erythema or drainage.    Orthopedic: S/p L foot TMA   Radiographs: Interval transmetatarsal amputation of the first through fifth digits.  Pathology:  A. FOOT, LEFT, TRANSMETATARSAL AMPUTATION:       Chronic osteomyelitis.       Subcutaneous tissue with congestion.       Skin ulcer and necrosis.       Bone margin is viable without evidence of acute osteomyelitis or  necrosis.   Micro: Rare GNR  Assessment:    1. Subacute osteomyelitis of left foot (HCC)   2. Skin ulcer of right great toe with fat layer exposed (HCC)   3. PAD (peripheral artery disease)   4. Post-operative state     Osteomyelitis and gangrene left foot s/p TMA Right foot ulceration dorsal hallux to subcutaneous fat tissue layer Plan:  Patient was evaluated and treated and all questions answered.  4 weeks s/p TMA left foot -Progressing with improvement in the left foot TMA site from prior.  Decreased maceration and overall improved healing today. - Okay to proceed with Monday Wednesday Friday Betadine and gauze dressing to the left foot TMA site. -Recommend Monday Wednesday Friday Betadine and gauze dressing to the right hallux as well discontinue antibiotic ointment -XR: expected changes -WB Status: NWB in post op shoe LLE, weightbearing as tolerated right foot in postop shoe -Sutures: Removed in total today from left foot TMA site and Steri-Strips applied -Medications/ABX: On long-term IV antibiotics per ID -Dressing: Changed today, recommend Monday Wednesday Friday dressing change with Betadine wet-to-dry to left foot TMA site as well as right hallux ulceration - F/u Plan: Patient to follow-up in 3 week        [name], DPM [facility] / [facility]

## 2023-11-23 ENCOUNTER — Other Ambulatory Visit: Payer: Self-pay

## 2023-11-23 ENCOUNTER — Ambulatory Visit (HOSPITAL_COMMUNITY)
Admission: RE | Admit: 2023-11-23 | Discharge: 2023-11-23 | Disposition: A | Discharge status: Home / Self Care | Payer: Medicare (Managed Care) | Source: Ambulatory Visit | Attending: Vascular Surgery | Admitting: Vascular Surgery

## 2023-11-23 ENCOUNTER — Ambulatory Visit: Payer: Medicare (Managed Care) | Attending: Vascular Surgery | Admitting: Physician Assistant

## 2023-11-23 VITALS — BP 140/78 | HR 98 | Temp 98.3°F | Wt 206.6 lb

## 2023-11-23 DIAGNOSIS — N186 End stage renal disease: Secondary | ICD-10-CM | POA: Diagnosis not present

## 2023-11-23 DIAGNOSIS — I872 Venous insufficiency (chronic) (peripheral): Secondary | ICD-10-CM | POA: Diagnosis not present

## 2023-11-23 DIAGNOSIS — I739 Peripheral vascular disease, unspecified: Secondary | ICD-10-CM | POA: Insufficient documentation

## 2023-11-23 DIAGNOSIS — I7025 Atherosclerosis of native arteries of other extremities with ulceration: Secondary | ICD-10-CM

## 2023-11-23 LAB — VAS US ABI WITH/WO TBI

## 2023-11-23 NOTE — Progress Notes (Signed)
 Office Note     CC:  follow up Requesting Provider:  Katrinka Garnette KIDD, MD  HPI: Edgar Brewer is a 64 y.o. (08/09/1960) male who presents for follow up at the request of Dr. Malvin. He is well known to our practice from prior dialysis access as well as for his PAD and venous disease. He most recently was seen in August during a hospitalization in which he underwent Angiogram with angioplasty left PTA via right CFA 09/20/2023 by Dr. Magda for gangrene. He underwent a 2nd toe amputation and later had to have a left TMA. He has been under the management of Dr. Malvin for wound care. He was recently seen on 10/28 for follow up of left TMA, which has been slowly healing. Per Dr. Emmitt note it is making good progress. At this visit he was noted to have new right great toe ulceration.   He is here today with his sister. Patient explains that he is not really sure when the wounds on right foot appeared. His sister says they were present at the time of his left foot wounds. Patient just recalls that it started out as a blister and turned into a blood blister which he believes popped. He denies any pain in the leg. He has a lot of numbness so he is unable to really feel it. He is without any fever or chills. He explains that he has not been doing much ambulation because initially he was suppose to be NWB but even now with his post operative shoe he says he tries to not walk to much. Patients sister and mother have been doing the dressing changes on his feet. Betadine and antibiotic ointment.  He is medically managed on aspirin  and statin.    Past Medical History:  Diagnosis Date   Acute on chronic combined systolic and diastolic CHF, NYHA class 1 (HCC) 01/13/2015   Allergy    Arthritis    Chronic kidney disease    MWF [facility].   Diabetes mellitus type 2 with complications (HCC)    Previous insulin , non-insulin  requiring noted 09/2018.  Complications include retinopathy, peripheral  neuropathy.   GERD (gastroesophageal reflux disease)    at times   Head injury with loss of consciousness (HCC)    History of kidney stones    Hypertension    Neuromuscular disorder (HCC)    Neuropathy    Pulmonary hypertension (HCC)    PA peak pressure 41 mmHg 03/04/16 echo   Stroke [facility])    no residual weakness   Varicose veins of lower extremities with complications 12/24/2014    Past Surgical History:  Procedure Laterality Date   AMPUTATION TOE Left 09/21/2023   Procedure: AMPUTATION, TOE;  Surgeon: Malvin Marsa FALCON, DPM;  Location: [facility] OR;  Service: Orthopedics/Podiatry;  Laterality: Left;  Left 2nd toe amputation   AV FISTULA PLACEMENT Left 06/01/2016   Procedure: ARTERIOVENOUS (AV) FISTULA CREATION;  Surgeon: Oris Krystal FALCON, MD;  Location: [facility] OR;  Service: Vascular;  Laterality: Left;   AV FISTULA PLACEMENT Left 08/10/2017   Procedure: BRACHIOCEPHALIC ARTERIOVENOUS (AV) FISTULA CREATION LEFT UPPER EXTREMITY;  Surgeon: Eliza Lonni RAMAN, MD;  Location[facility] OR;  Service: Vascular;  Laterality: Left;   CHOLECYSTECTOMY N/A 10/04/2018   Procedure: LAPAROSCOPIC CHOLECYSTECTOMY;  Surgeon: Stevie Herlene Righter, MD;  Location: [facility] OR;  Service: General;  Laterality: N/A;   COLONOSCOPY W/ POLYPECTOMY  03/2016   For evaluation of iron deficiency anemia.  Dr. Shila.  10 polyps, largest 20 mm.  Pathology: tubular adenomas with no high-grade dysplasia, hyperplastic.  None bleeding internal hemorrhoids.  Pandiverticulosis.   CYSTOSCOPY WITH RETROGRADE PYELOGRAM, URETEROSCOPY AND STENT PLACEMENT Bilateral 02/25/2021   Procedure: CYSTOSCOPY WITH BILATERAL  RETROGRADE PYELOGRAM, RIGHT DIAGNOSTIC URETEROSCOPY,  BLADDER BIOPSY WITH FULGERATION;  Surgeon: Alvaro Hummer, MD;  Location: [facility] ORS;  Service: Urology;  Laterality: Bilateral;   ENDOVENOUS ABLATION SAPHENOUS VEIN W/ LASER Left 07-23-2015   endovenous laser ablation left greater saphenous vein by Krystal Doing MD   ENDOVENOUS ABLATION  SAPHENOUS VEIN W/ LASER Right 10/15/2015   EVLA right greater saphenous vein by Krystal Doing MD   ESOPHAGOGASTRODUODENOSCOPY  03/2016   For evaluation of IDA.  Dr. Shila.  Duodenal erythema, pathology benign mucosa, no villous atrophy.   GALLBLADDER SURGERY  10/2018   HOLMIUM LASER APPLICATION Right 02/25/2021   Procedure: HOLMIUM LASER OF TUMOR;  Surgeon: Alvaro Hummer, MD;  Location: [facility] ORS;  Service: Urology;  Laterality: Right;   IR RADIOLOGIST EVAL & MGMT  11/29/2018   LOWER EXTREMITY ANGIOGRAPHY N/A 09/20/2023   Procedure: Lower Extremity Angiography;  Surgeon: Magda Debby SAILOR, MD;  Location: [facility] INVASIVE CV LAB;  Service: Cardiovascular;  Laterality: N/A;   LOWER EXTREMITY INTERVENTION Left 09/20/2023   Procedure: LOWER EXTREMITY INTERVENTION;  Surgeon: Magda Debby SAILOR, MD;  Location: [facility] INVASIVE CV LAB;  Service: Cardiovascular;  Laterality: Left;   TEE WITHOUT CARDIOVERSION N/A 11/19/2018   Procedure: TRANSESOPHAGEAL ECHOCARDIOGRAM (TEE);  Surgeon: Alveta Aleene PARAS, MD;  Location: [facility] ENDOSCOPY;  Service: Cardiovascular;  Laterality: N/A;   TRANSESOPHAGEAL ECHOCARDIOGRAM (CATH LAB) N/A 11/01/2023   Procedure: TRANSESOPHAGEAL ECHOCARDIOGRAM;  Surgeon: Floretta Mallard, MD;  Location: [facility] INVASIVE CV LAB;  Service: Cardiovascular;  Laterality: N/A;   TRANSMETATARSAL AMPUTATION Left 10/23/2023   Procedure: AMPUTATION, FOOT, TRANSMETATARSAL;  Surgeon: Malvin Marsa FALCON, DPM;  Location: [facility] OR;  Service: Orthopedics/Podiatry;  Laterality: Left;    Social History   Socioeconomic History   Marital status: Single    Spouse name: Not on file   Number of children: Not on file   Years of education: Not on file   Highest education level: Not on file  Occupational History   Not on file  Tobacco Use   Smoking status: Former    Current packs/day: 0.00    Average packs/day: 1 pack/day for 45.0 years (45.0 ttl pk-yrs)    Types: Cigarettes    Start date: 01/12/1970    Quit date: 01/13/2015     Years since quitting: 8.8   Smokeless tobacco: Never  Vaping Use   Vaping status: Never Used  Substance and Sexual Activity   Alcohol use: Yes    Alcohol/week: 1.0 standard drink of alcohol    Types: 1 Glasses of wine per week    Comment: rare occassion   Drug use: No   Sexual activity: Not Currently  Other Topics Concern   Not on file  Social History Narrative   Separated for many years over 15 years/practical divorce. No Kids. No pets.  Lives with mom [name]      Disabled- from dialysis and diabetes. 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      Hobbies: play cards, play poker, bet on horses   Social Drivers of Health   Financial Resource Strain: Low Risk  (04/19/2022)   Overall Financial Resource Strain (CARDIA)    Difficulty of Paying Living Expenses: Not hard at all  Food Insecurity: No Food Insecurity (10/20/2023)   Hunger Vital  Sign    Worried About Programme Researcher, Broadcasting/film/video in the Last Year: Never true    Ran Out of Food in the Last Year: Never true  Transportation Needs: No Transportation Needs (10/20/2023)   PRAPARE - Administrator, Civil Service (Medical): No    Lack of Transportation (Non-Medical): No  Physical Activity: Inactive (04/19/2022)   Exercise Vital Sign    Days of Exercise per Week: 0 days    Minutes of Exercise per Session: 0 min  Stress: No Stress Concern Present (04/19/2022)   [employer] of Occupational Health - Occupational Stress Questionnaire    Feeling of Stress : Not at all  Social Connections: Unknown (04/19/2022)   Social Connection and Isolation Panel    Frequency of Communication with Friends and Family: Not on file    Frequency of Social Gatherings with Friends and Family: Twice a week    Attends Religious Services: More than 4 times per year    Active Member of [employer] or Organizations: No    Attends Banker Meetings: Never    Marital Status: Separated  Intimate Partner Violence:  Not At Risk (10/20/2023)   Humiliation, Afraid, Rape, and Kick questionnaire    Fear of Current or Ex-Partner: No    Emotionally Abused: No    Physically Abused: No    Sexually Abused: No    Family History  Problem Relation Age of Onset   Arthritis Mother    Hearing loss Father    Prostate cancer Father    Hyperlipidemia Maternal Grandmother    Heart disease Maternal Grandmother    Lung cancer Maternal Grandfather     Current Outpatient Medications  Medication Sig Dispense Refill   aspirin  EC 81 MG tablet Take 81 mg by mouth daily. Swallow whole.     b complex-vitamin c -folic acid  (NEPHRO-VITE) 0.8 MG TABS tablet Take 1 tablet by mouth in the morning.     cefTAZidime 1 g in sodium chloride  0.9 % 100 mL Inject 1 g into the vein 3 (three) times a week.     cinacalcet  (SENSIPAR ) 60 MG tablet Take 60 mg by mouth every Tuesday, Thursday, and Saturday at 6 PM. In the evening.     diphenhydramine -acetaminophen  (TYLENOL  PM) 25-500 MG TABS tablet Take 1 tablet by mouth at bedtime as needed (Sleep/Pain).     Docusate Calcium  (STOOL SOFTENER PO) Take 1-2 tablets by mouth 2 (two) times daily as needed (Constipation).     gabapentin  (NEURONTIN ) 100 MG capsule TAKE 1 CAPSULE BY MOUTH THREE TIMES DAILY 270 capsule 3   LANTUS  SOLOSTAR 100 UNIT/ML Solostar Pen Inject 50 Units into the skin daily.     omeprazole  (PRILOSEC) 20 MG capsule Take 1 capsule by mouth once daily 90 capsule 0   rosuvastatin  (CRESTOR ) 10 MG tablet Take 1 tablet by mouth once daily 90 tablet 0   sevelamer  carbonate (RENVELA ) 800 MG tablet Take 2 tablets (1,600 mg total) by mouth 3 (three) times daily with meals.     vancomycin  (VANCOREADY) 750 MG/150ML SOLN Inject 150 mLs (750 mg total) into the vein every Monday, Wednesday, and Friday with hemodialysis.     No current facility-administered medications for this visit.    Allergies  Allergen Reactions   Codeine Anaphylaxis, Hives, Swelling and Other (See Comments)     Swelling all over body and the throat   Clindamycin /Lincomycin Nausea And Vomiting     REVIEW OF SYSTEMS:  Negative unless noted in HPI [X]  denotes  positive finding, [ ]  denotes negative finding Cardiac  Comments:  Chest pain or chest pressure:    Shortness of breath upon exertion:    Short of breath when lying flat:    Irregular heart rhythm:        Vascular    Pain in calf, thigh, or hip brought on by ambulation:    Pain in feet at night that wakes you up from your sleep:     Blood clot in your veins:    Leg swelling:         Pulmonary    Oxygen at home:    Productive cough:     Wheezing:         Neurologic    Sudden weakness in arms or legs:     Sudden numbness in arms or legs:     Sudden onset of difficulty speaking or slurred speech:    Temporary loss of vision in one eye:     Problems with dizziness:         Gastrointestinal    Blood in stool:     Vomited blood:         Genitourinary    Burning when urinating:     Blood in urine:        Psychiatric    Major depression:         Hematologic    Bleeding problems:    Problems with blood clotting too easily:        Skin    Rashes or ulcers:        Constitutional    Fever or chills:      PHYSICAL EXAMINATION:  Vitals:   11/23/23 1300  BP: (!) 140/78  Pulse: 98  Temp: 98.3 F (36.8 C)  TempSrc: Temporal  Weight: 206 lb 9.6 oz (93.7 kg)    General:  WDWN in NAD; vital signs documented above Gait: Not observed, in wheel chair HENT: WNL, normocephalic Pulmonary: normal non-labored breathing Cardiac: regular HR Abdomen: soft Vascular Exam/Pulses: Difficult to palpate femoral pulses due to body habitus and in wheel chair, no distal pulses palpable, feet warm Extremities: with ischemic ulcers on the medial malleolus, distal leg, dorsum of foot and left great toe as shown below, without Gangrene , without cellulitis; left TMA dressings in place    Musculoskeletal: no muscle wasting or  atrophy  Neurologic: A&O X 3 Psychiatric:  The pt has Normal affect.   Non-Invasive Vascular Imaging:    +-------+---------------+-----------+---------------+------------+  ABI/TBIToday's ABI    Today's TBIPrevious ABI   Previous TBI  +-------+---------------+-----------+---------------+------------+  Right Noncompressiblen/a        Noncompressible0.61          +-------+---------------+-----------+---------------+------------+  Left  Noncompressibleamputation Noncompressible0.58          +-------+---------------+-----------+---------------+------------+    Bilateral ABIs appear essentially unchanged.    ASSESSMENT/PLAN:: 63 y.o. male here for follow up at the request of Dr. Malvin. He most recently was seen in August during a hospitalization in which he underwent Angiogram with angioplasty left PTA via right CFA 09/20/2023 by Dr. Magda for gangrene. He underwent a 2nd toe amputation and later had to have a left TMA. Left TMA is healing well. Right foot with several dry ulcers present. He has neuropathy so is without any pain. No claudication.  - ABI is essentially unchanged but he has [place] vessels. Toe waveforms are monophasic - continue Aspirin  and statin - He dialyzes on MWF - Recommendation is for Aortogram, Arteriogram of RLE  to further evaluate his RLE blood flow to optimize healing. I discussed risks/ benefits of procedure with patient and his sister including bleeding, arterial injury or dissection, thrombosis, or need for surgical intervention - I will scheduled this in the near future with Dr. Magda on a non dialysis day   Teretha Damme, PA-C [facility] [phone]  Clinic MD:   [place]

## 2023-11-23 NOTE — H&P (View-Only) (Signed)
 Office Note     CC:  follow up Requesting Provider:  Katrinka Garnette KIDD, MD  HPI: Edgar Brewer is a 64 y.o. (08/09/1960) male who presents for follow up at the request of Dr. Malvin. He is well known to our practice from prior dialysis access as well as for his PAD and venous disease. He most recently was seen in August during a hospitalization in which he underwent Angiogram with angioplasty left PTA via right CFA 09/20/2023 by Dr. Magda for gangrene. He underwent a 2nd toe amputation and later had to have a left TMA. He has been under the management of Dr. Malvin for wound care. He was recently seen on 10/28 for follow up of left TMA, which has been slowly healing. Per Dr. Emmitt note it is making good progress. At this visit he was noted to have new right great toe ulceration.   He is here today with his sister. Patient explains that he is not really sure when the wounds on right foot appeared. His sister says they were present at the time of his left foot wounds. Patient just recalls that it started out as a blister and turned into a blood blister which he believes popped. He denies any pain in the leg. He has a lot of numbness so he is unable to really feel it. He is without any fever or chills. He explains that he has not been doing much ambulation because initially he was suppose to be NWB but even now with his post operative shoe he says he tries to not walk to much. Patients sister and mother have been doing the dressing changes on his feet. Betadine and antibiotic ointment.  He is medically managed on aspirin  and statin.    Past Medical History:  Diagnosis Date   Acute on chronic combined systolic and diastolic CHF, NYHA class 1 (HCC) 01/13/2015   Allergy    Arthritis    Chronic kidney disease    MWF [facility].   Diabetes mellitus type 2 with complications (HCC)    Previous insulin , non-insulin  requiring noted 09/2018.  Complications include retinopathy, peripheral  neuropathy.   GERD (gastroesophageal reflux disease)    at times   Head injury with loss of consciousness (HCC)    History of kidney stones    Hypertension    Neuromuscular disorder (HCC)    Neuropathy    Pulmonary hypertension (HCC)    PA peak pressure 41 mmHg 03/04/16 echo   Stroke [facility])    no residual weakness   Varicose veins of lower extremities with complications 12/24/2014    Past Surgical History:  Procedure Laterality Date   AMPUTATION TOE Left 09/21/2023   Procedure: AMPUTATION, TOE;  Surgeon: Malvin Marsa FALCON, DPM;  Location: [facility] OR;  Service: Orthopedics/Podiatry;  Laterality: Left;  Left 2nd toe amputation   AV FISTULA PLACEMENT Left 06/01/2016   Procedure: ARTERIOVENOUS (AV) FISTULA CREATION;  Surgeon: Oris Krystal FALCON, MD;  Location: [facility] OR;  Service: Vascular;  Laterality: Left;   AV FISTULA PLACEMENT Left 08/10/2017   Procedure: BRACHIOCEPHALIC ARTERIOVENOUS (AV) FISTULA CREATION LEFT UPPER EXTREMITY;  Surgeon: Eliza Lonni RAMAN, MD;  Location: [facility] OR;  Service: Vascular;  Laterality: Left;   CHOLECYSTECTOMY N/A 10/04/2018   Procedure: LAPAROSCOPIC CHOLECYSTECTOMY;  Surgeon: Stevie Herlene Righter, MD;  Location: [facility] OR;  Service: General;  Laterality: N/A;   COLONOSCOPY W/ POLYPECTOMY  03/2016   For evaluation of iron deficiency anemia.  Dr. Shila.  10 polyps, largest 20 mm.  Pathology: tubular adenomas with no high-grade dysplasia, hyperplastic.  None bleeding internal hemorrhoids.  Pandiverticulosis.   CYSTOSCOPY WITH RETROGRADE PYELOGRAM, URETEROSCOPY AND STENT PLACEMENT Bilateral 02/25/2021   Procedure: CYSTOSCOPY WITH BILATERAL  RETROGRADE PYELOGRAM, RIGHT DIAGNOSTIC URETEROSCOPY,  BLADDER BIOPSY WITH FULGERATION;  Surgeon: Alvaro Hummer, MD;  Location: [facility] ORS;  Service: Urology;  Laterality: Bilateral;   ENDOVENOUS ABLATION SAPHENOUS VEIN W/ LASER Left 07-23-2015   endovenous laser ablation left greater saphenous vein by Krystal Doing MD   ENDOVENOUS ABLATION  SAPHENOUS VEIN W/ LASER Right 10/15/2015   EVLA right greater saphenous vein by Krystal Doing MD   ESOPHAGOGASTRODUODENOSCOPY  03/2016   For evaluation of IDA.  Dr. Shila.  Duodenal erythema, pathology benign mucosa, no villous atrophy.   GALLBLADDER SURGERY  10/2018   HOLMIUM LASER APPLICATION Right 02/25/2021   Procedure: HOLMIUM LASER OF TUMOR;  Surgeon: Alvaro Hummer, MD;  Location: [facility] ORS;  Service: Urology;  Laterality: Right;   IR RADIOLOGIST EVAL & MGMT  11/29/2018   LOWER EXTREMITY ANGIOGRAPHY N/A 09/20/2023   Procedure: Lower Extremity Angiography;  Surgeon: Magda Debby SAILOR, MD;  Location: [facility] INVASIVE CV LAB;  Service: Cardiovascular;  Laterality: N/A;   LOWER EXTREMITY INTERVENTION Left 09/20/2023   Procedure: LOWER EXTREMITY INTERVENTION;  Surgeon: Magda Debby SAILOR, MD;  Location: [facility] INVASIVE CV LAB;  Service: Cardiovascular;  Laterality: Left;   TEE WITHOUT CARDIOVERSION N/A 11/19/2018   Procedure: TRANSESOPHAGEAL ECHOCARDIOGRAM (TEE);  Surgeon: Alveta Aleene PARAS, MD;  Location: [facility] ENDOSCOPY;  Service: Cardiovascular;  Laterality: N/A;   TRANSESOPHAGEAL ECHOCARDIOGRAM (CATH LAB) N/A 11/01/2023   Procedure: TRANSESOPHAGEAL ECHOCARDIOGRAM;  Surgeon: Floretta Mallard, MD;  Location: [facility] INVASIVE CV LAB;  Service: Cardiovascular;  Laterality: N/A;   TRANSMETATARSAL AMPUTATION Left 10/23/2023   Procedure: AMPUTATION, FOOT, TRANSMETATARSAL;  Surgeon: Malvin Marsa FALCON, DPM;  Location: [facility] OR;  Service: Orthopedics/Podiatry;  Laterality: Left;    Social History   Socioeconomic History   Marital status: Single    Spouse name: Not on file   Number of children: Not on file   Years of education: Not on file   Highest education level: Not on file  Occupational History   Not on file  Tobacco Use   Smoking status: Former    Current packs/day: 0.00    Average packs/day: 1 pack/day for 45.0 years (45.0 ttl pk-yrs)    Types: Cigarettes    Start date: 01/12/1970    Quit date: 01/13/2015     Years since quitting: 8.8   Smokeless tobacco: Never  Vaping Use   Vaping status: Never Used  Substance and Sexual Activity   Alcohol use: Yes    Alcohol/week: 1.0 standard drink of alcohol    Types: 1 Glasses of wine per week    Comment: rare occassion   Drug use: No   Sexual activity: Not Currently  Other Topics Concern   Not on file  Social History Narrative   Separated for many years over 15 years/practical divorce. No Kids. No pets.  Lives with mom [name]      Disabled- from dialysis and diabetes. 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      Hobbies: play cards, play poker, bet on horses   Social Drivers of Health   Financial Resource Strain: Low Risk  (04/19/2022)   Overall Financial Resource Strain (CARDIA)    Difficulty of Paying Living Expenses: Not hard at all  Food Insecurity: No Food Insecurity (10/20/2023)   Hunger Vital  Sign    Worried About Programme Researcher, Broadcasting/film/video in the Last Year: Never true    Ran Out of Food in the Last Year: Never true  Transportation Needs: No Transportation Needs (10/20/2023)   PRAPARE - Administrator, Civil Service (Medical): No    Lack of Transportation (Non-Medical): No  Physical Activity: Inactive (04/19/2022)   Exercise Vital Sign    Days of Exercise per Week: 0 days    Minutes of Exercise per Session: 0 min  Stress: No Stress Concern Present (04/19/2022)   [employer] of Occupational Health - Occupational Stress Questionnaire    Feeling of Stress : Not at all  Social Connections: Unknown (04/19/2022)   Social Connection and Isolation Panel    Frequency of Communication with Friends and Family: Not on file    Frequency of Social Gatherings with Friends and Family: Twice a week    Attends Religious Services: More than 4 times per year    Active Member of [employer] or Organizations: No    Attends Banker Meetings: Never    Marital Status: Separated  Intimate Partner Violence:  Not At Risk (10/20/2023)   Humiliation, Afraid, Rape, and Kick questionnaire    Fear of Current or Ex-Partner: No    Emotionally Abused: No    Physically Abused: No    Sexually Abused: No    Family History  Problem Relation Age of Onset   Arthritis Mother    Hearing loss Father    Prostate cancer Father    Hyperlipidemia Maternal Grandmother    Heart disease Maternal Grandmother    Lung cancer Maternal Grandfather     Current Outpatient Medications  Medication Sig Dispense Refill   aspirin  EC 81 MG tablet Take 81 mg by mouth daily. Swallow whole.     b complex-vitamin c -folic acid  (NEPHRO-VITE) 0.8 MG TABS tablet Take 1 tablet by mouth in the morning.     cefTAZidime 1 g in sodium chloride  0.9 % 100 mL Inject 1 g into the vein 3 (three) times a week.     cinacalcet  (SENSIPAR ) 60 MG tablet Take 60 mg by mouth every Tuesday, Thursday, and Saturday at 6 PM. In the evening.     diphenhydramine -acetaminophen  (TYLENOL  PM) 25-500 MG TABS tablet Take 1 tablet by mouth at bedtime as needed (Sleep/Pain).     Docusate Calcium  (STOOL SOFTENER PO) Take 1-2 tablets by mouth 2 (two) times daily as needed (Constipation).     gabapentin  (NEURONTIN ) 100 MG capsule TAKE 1 CAPSULE BY MOUTH THREE TIMES DAILY 270 capsule 3   LANTUS  SOLOSTAR 100 UNIT/ML Solostar Pen Inject 50 Units into the skin daily.     omeprazole  (PRILOSEC) 20 MG capsule Take 1 capsule by mouth once daily 90 capsule 0   rosuvastatin  (CRESTOR ) 10 MG tablet Take 1 tablet by mouth once daily 90 tablet 0   sevelamer  carbonate (RENVELA ) 800 MG tablet Take 2 tablets (1,600 mg total) by mouth 3 (three) times daily with meals.     vancomycin  (VANCOREADY) 750 MG/150ML SOLN Inject 150 mLs (750 mg total) into the vein every Monday, Wednesday, and Friday with hemodialysis.     No current facility-administered medications for this visit.    Allergies  Allergen Reactions   Codeine Anaphylaxis, Hives, Swelling and Other (See Comments)     Swelling all over body and the throat   Clindamycin /Lincomycin Nausea And Vomiting     REVIEW OF SYSTEMS:  Negative unless noted in HPI [X]  denotes  positive finding, [ ]  denotes negative finding Cardiac  Comments:  Chest pain or chest pressure:    Shortness of breath upon exertion:    Short of breath when lying flat:    Irregular heart rhythm:        Vascular    Pain in calf, thigh, or hip brought on by ambulation:    Pain in feet at night that wakes you up from your sleep:     Blood clot in your veins:    Leg swelling:         Pulmonary    Oxygen at home:    Productive cough:     Wheezing:         Neurologic    Sudden weakness in arms or legs:     Sudden numbness in arms or legs:     Sudden onset of difficulty speaking or slurred speech:    Temporary loss of vision in one eye:     Problems with dizziness:         Gastrointestinal    Blood in stool:     Vomited blood:         Genitourinary    Burning when urinating:     Blood in urine:        Psychiatric    Major depression:         Hematologic    Bleeding problems:    Problems with blood clotting too easily:        Skin    Rashes or ulcers:        Constitutional    Fever or chills:      PHYSICAL EXAMINATION:  Vitals:   11/23/23 1300  BP: (!) 140/78  Pulse: 98  Temp: 98.3 F (36.8 C)  TempSrc: Temporal  Weight: 206 lb 9.6 oz (93.7 kg)    General:  WDWN in NAD; vital signs documented above Gait: Not observed, in wheel chair HENT: WNL, normocephalic Pulmonary: normal non-labored breathing Cardiac: regular HR Abdomen: soft Vascular Exam/Pulses: Difficult to palpate femoral pulses due to body habitus and in wheel chair, no distal pulses palpable, feet warm Extremities: with ischemic ulcers on the medial malleolus, distal leg, dorsum of foot and left great toe as shown below, without Gangrene , without cellulitis; left TMA dressings in place    Musculoskeletal: no muscle wasting or  atrophy  Neurologic: A&O X 3 Psychiatric:  The pt has Normal affect.   Non-Invasive Vascular Imaging:    +-------+---------------+-----------+---------------+------------+  ABI/TBIToday's ABI    Today's TBIPrevious ABI   Previous TBI  +-------+---------------+-----------+---------------+------------+  Right Noncompressiblen/a        Noncompressible0.61          +-------+---------------+-----------+---------------+------------+  Left  Noncompressibleamputation Noncompressible0.58          +-------+---------------+-----------+---------------+------------+    Bilateral ABIs appear essentially unchanged.    ASSESSMENT/PLAN:: 63 y.o. male here for follow up at the request of Dr. Malvin. He most recently was seen in August during a hospitalization in which he underwent Angiogram with angioplasty left PTA via right CFA 09/20/2023 by Dr. Magda for gangrene. He underwent a 2nd toe amputation and later had to have a left TMA. Left TMA is healing well. Right foot with several dry ulcers present. He has neuropathy so is without any pain. No claudication.  - ABI is essentially unchanged but he has [place] vessels. Toe waveforms are monophasic - continue Aspirin  and statin - He dialyzes on MWF - Recommendation is for Aortogram, Arteriogram of RLE  to further evaluate his RLE blood flow to optimize healing. I discussed risks/ benefits of procedure with patient and his sister including bleeding, arterial injury or dissection, thrombosis, or need for surgical intervention - I will scheduled this in the near future with Dr. Magda on a non dialysis day   Teretha Damme, PA-C [facility] [phone]  Clinic MD:   [place]

## 2023-11-24 ENCOUNTER — Encounter (HOSPITAL_COMMUNITY): Payer: Self-pay

## 2023-11-27 ENCOUNTER — Encounter (HOSPITAL_COMMUNITY): Payer: Self-pay

## 2023-11-28 ENCOUNTER — Encounter (HOSPITAL_COMMUNITY): Payer: Self-pay

## 2023-11-29 ENCOUNTER — Encounter (HOSPITAL_COMMUNITY): Payer: Self-pay

## 2023-11-30 ENCOUNTER — Inpatient Hospital Stay: Payer: Medicare (Managed Care) | Admitting: Internal Medicine

## 2023-12-01 ENCOUNTER — Other Ambulatory Visit (HOSPITAL_COMMUNITY): Payer: Self-pay

## 2023-12-01 ENCOUNTER — Encounter (HOSPITAL_COMMUNITY): Admission: RE | Disposition: A | Payer: Self-pay | Source: Home / Self Care | Attending: Vascular Surgery

## 2023-12-01 ENCOUNTER — Other Ambulatory Visit: Payer: Self-pay

## 2023-12-01 ENCOUNTER — Ambulatory Visit (HOSPITAL_COMMUNITY)
Admission: RE | Admit: 2023-12-01 | Discharge: 2023-12-01 | Disposition: A | Discharge status: Home / Self Care | Payer: Medicare (Managed Care) | Attending: Vascular Surgery | Admitting: Vascular Surgery

## 2023-12-01 DIAGNOSIS — Z87891 Personal history of nicotine dependence: Secondary | ICD-10-CM | POA: Insufficient documentation

## 2023-12-01 DIAGNOSIS — E11621 Type 2 diabetes mellitus with foot ulcer: Secondary | ICD-10-CM | POA: Diagnosis not present

## 2023-12-01 DIAGNOSIS — I70235 Atherosclerosis of native arteries of right leg with ulceration of other part of foot: Secondary | ICD-10-CM | POA: Diagnosis not present

## 2023-12-01 DIAGNOSIS — N186 End stage renal disease: Secondary | ICD-10-CM | POA: Insufficient documentation

## 2023-12-01 DIAGNOSIS — L97519 Non-pressure chronic ulcer of other part of right foot with unspecified severity: Secondary | ICD-10-CM | POA: Insufficient documentation

## 2023-12-01 DIAGNOSIS — E1122 Type 2 diabetes mellitus with diabetic chronic kidney disease: Secondary | ICD-10-CM | POA: Diagnosis not present

## 2023-12-01 DIAGNOSIS — Z79899 Other long term (current) drug therapy: Secondary | ICD-10-CM | POA: Insufficient documentation

## 2023-12-01 DIAGNOSIS — Z7902 Long term (current) use of antithrombotics/antiplatelets: Secondary | ICD-10-CM | POA: Insufficient documentation

## 2023-12-01 DIAGNOSIS — Z794 Long term (current) use of insulin: Secondary | ICD-10-CM | POA: Insufficient documentation

## 2023-12-01 DIAGNOSIS — Z7982 Long term (current) use of aspirin: Secondary | ICD-10-CM | POA: Insufficient documentation

## 2023-12-01 DIAGNOSIS — I5042 Chronic combined systolic (congestive) and diastolic (congestive) heart failure: Secondary | ICD-10-CM | POA: Diagnosis not present

## 2023-12-01 DIAGNOSIS — I7025 Atherosclerosis of native arteries of other extremities with ulceration: Secondary | ICD-10-CM

## 2023-12-01 DIAGNOSIS — E1151 Type 2 diabetes mellitus with diabetic peripheral angiopathy without gangrene: Secondary | ICD-10-CM | POA: Insufficient documentation

## 2023-12-01 HISTORY — PX: LOWER EXTREMITY INTERVENTION: CATH118252

## 2023-12-01 HISTORY — PX: ABDOMINAL AORTOGRAM W/LOWER EXTREMITY: CATH118223

## 2023-12-01 HISTORY — PX: LOWER EXTREMITY ANGIOGRAPHY: CATH118251

## 2023-12-01 LAB — POCT I-STAT, CHEM 8
BUN: 40 mg/dL — ABNORMAL HIGH (ref 8–23)
Calcium, Ion: 1.11 mmol/L — ABNORMAL LOW (ref 1.15–1.40)
Chloride: 98 mmol/L (ref 98–111)
Creatinine, Ser: 9.3 mg/dL — ABNORMAL HIGH (ref 0.61–1.24)
Glucose, Bld: 154 mg/dL — ABNORMAL HIGH (ref 70–99)
HCT: 30 % — ABNORMAL LOW (ref 39.0–52.0)
Hemoglobin: 10.2 g/dL — ABNORMAL LOW (ref 13.0–17.0)
Potassium: 4.8 mmol/L (ref 3.5–5.1)
Sodium: 136 mmol/L (ref 135–145)
TCO2: 31 mmol/L (ref 22–32)

## 2023-12-01 SURGERY — ABDOMINAL AORTOGRAM W/LOWER EXTREMITY
Anesthesia: LOCAL

## 2023-12-01 MED ORDER — IODIXANOL 320 MG/ML IV SOLN
INTRAVENOUS | Status: DC | PRN
  Administered 2023-12-01: 100 mL via INTRA_ARTERIAL

## 2023-12-01 MED ORDER — FENTANYL CITRATE (PF) 100 MCG/2ML IJ SOLN
INTRAMUSCULAR | Status: AC
  Filled 2023-12-01: qty 2

## 2023-12-01 MED ORDER — HEPARIN (PORCINE) IN NACL 1000-0.9 UT/500ML-% IV SOLN
INTRAVENOUS | Status: DC | PRN
  Administered 2023-12-01: 1000 mL

## 2023-12-01 MED ORDER — HEPARIN SODIUM (PORCINE) 1000 UNIT/ML IJ SOLN
INTRAMUSCULAR | Status: DC | PRN
  Administered 2023-12-01: 10000 [IU] via INTRAVENOUS

## 2023-12-01 MED ORDER — HYDRALAZINE HCL 20 MG/ML IJ SOLN: 5.0000 mg | INTRAMUSCULAR | Status: DC | PRN

## 2023-12-01 MED ORDER — MIDAZOLAM HCL 2 MG/2ML IJ SOLN
INTRAMUSCULAR | Status: AC
Start: 2023-12-01 — End: 2023-12-01
  Filled 2023-12-01: qty 2

## 2023-12-01 MED ORDER — ASPIRIN 81 MG PO CHEW
CHEWABLE_TABLET | ORAL | Status: AC
  Filled 2023-12-01: qty 1

## 2023-12-01 MED ORDER — ASPIRIN 81 MG PO CHEW
CHEWABLE_TABLET | ORAL | Status: DC | PRN
  Administered 2023-12-01: 81 mg via ORAL

## 2023-12-01 MED ORDER — ASPIRIN 81 MG PO TBEC: 81.0000 mg | DELAYED_RELEASE_TABLET | Freq: Every day | ORAL | Status: DC

## 2023-12-01 MED ORDER — CLOPIDOGREL BISULFATE 300 MG PO TABS
ORAL_TABLET | ORAL | Status: AC
  Filled 2023-12-01: qty 1

## 2023-12-01 MED ORDER — MIDAZOLAM HCL (PF) 2 MG/2ML IJ SOLN
INTRAMUSCULAR | Status: DC | PRN
  Administered 2023-12-01: 1 mg via INTRAVENOUS

## 2023-12-01 MED ORDER — LIDOCAINE HCL (PF) 1 % IJ SOLN
INTRAMUSCULAR | Status: DC | PRN
  Administered 2023-12-01: 20 mL via INTRADERMAL

## 2023-12-01 MED ORDER — LABETALOL HCL 5 MG/ML IV SOLN: 10.0000 mg | INTRAVENOUS | Status: DC | PRN

## 2023-12-01 MED ORDER — FENTANYL CITRATE (PF) 100 MCG/2ML IJ SOLN
INTRAMUSCULAR | Status: DC | PRN
  Administered 2023-12-01: 50 ug via INTRAVENOUS

## 2023-12-01 MED ORDER — SODIUM CHLORIDE 0.9% FLUSH: 3.0000 mL | INTRAVENOUS | Status: DC | PRN

## 2023-12-01 MED ORDER — ACETAMINOPHEN 325 MG PO TABS: 650.0000 mg | ORAL_TABLET | ORAL | Status: DC | PRN

## 2023-12-01 MED ORDER — LIDOCAINE HCL (PF) 1 % IJ SOLN
INTRAMUSCULAR | Status: AC
Start: 2023-12-01 — End: 2023-12-01
  Filled 2023-12-01: qty 30

## 2023-12-01 MED ORDER — SODIUM CHLORIDE 0.9 % IV SOLN: 250.0000 mL | INTRAVENOUS | Status: DC | PRN

## 2023-12-01 MED ORDER — CLOPIDOGREL BISULFATE 75 MG PO TABS
75.0000 mg | ORAL_TABLET | Freq: Every day | ORAL | 11 refills | Status: AC
  Filled 2023-12-01: qty 30, 30d supply, fill #0

## 2023-12-01 MED ORDER — ONDANSETRON HCL 4 MG/2ML IJ SOLN
4.0000 mg | Freq: Four times a day (QID) | INTRAMUSCULAR | Status: DC | PRN
Start: 2023-12-01 — End: 2023-12-01

## 2023-12-01 MED ORDER — CLOPIDOGREL BISULFATE 75 MG PO TABS: 300.0000 mg | ORAL_TABLET | Freq: Once | ORAL | Status: DC

## 2023-12-01 MED ORDER — HEPARIN SODIUM (PORCINE) 1000 UNIT/ML IJ SOLN
INTRAMUSCULAR | Status: AC
  Filled 2023-12-01: qty 10

## 2023-12-01 MED ORDER — SODIUM CHLORIDE 0.9% FLUSH: 3.0000 mL | Freq: Two times a day (BID) | INTRAVENOUS | Status: DC

## 2023-12-01 MED ORDER — CLOPIDOGREL BISULFATE 75 MG PO TABS: 75.0000 mg | ORAL_TABLET | Freq: Every day | ORAL | Status: DC

## 2023-12-01 MED ORDER — CLOPIDOGREL BISULFATE 300 MG PO TABS
ORAL_TABLET | ORAL | Status: DC | PRN
  Administered 2023-12-01: 300 mg via ORAL

## 2023-12-01 SURGICAL SUPPLY — 14 items
CATH OMNI FLUSH 5F 65CM (CATHETERS) IMPLANT
CLOSURE PERCLOSE PROSTYLE (Vascular Products) IMPLANT
DCB IN.PACT 6X120 (BALLOONS) IMPLANT
GLIDEWIRE ADV .035X260CM (WIRE) IMPLANT
KIT ENCORE 26 ADVANTAGE (KITS) IMPLANT
KIT MICROPUNCTURE NIT STIFF (SHEATH) IMPLANT
KIT SINGLE USE MANIFOLD (KITS) IMPLANT
SET ATX-X65L (MISCELLANEOUS) IMPLANT
SHEATH CATAPULT 6FR 45 (SHEATH) IMPLANT
SHEATH PINNACLE 5F 10CM (SHEATH) IMPLANT
SHEATH PINNACLE 6F 10CM (SHEATH) IMPLANT
SHEATH PROBE COVER 6X72 (BAG) IMPLANT
TRAY PV CATH (CUSTOM PROCEDURE TRAY) ×1 IMPLANT
WIRE BENTSON .035X145CM (WIRE) IMPLANT

## 2023-12-01 NOTE — Interval H&P Note (Signed)
 History and Physical Interval Note:  12/01/2023 11:06 AM  Edgar Brewer  has presented today for surgery, with the diagnosis of ulcer on rt toe.  The various methods of treatment have been discussed with the patient and family. After consideration of risks, benefits and other options for treatment, the patient has consented to  Procedure(s): ABDOMINAL AORTOGRAM W/LOWER EXTREMITY (N/A) Lower Extremity Angiography (N/A) LOWER EXTREMITY INTERVENTION (N/A) as a surgical intervention.  The patient's history has been reviewed, patient examined, no change in status, stable for surgery.  I have reviewed the patient's chart and labs.  Questions were answered to the patient's satisfaction.     [name]

## 2023-12-01 NOTE — Discharge Instructions (Signed)
 Femoral Site Care The following information offers guidance on how to care for yourself after your procedure. Your health care provider may also give you more specific instructions. If you have problems or questions, contact your health care provider. What can I expect after the procedure? After the procedure, it is common to have bruising and tenderness at the incision site. This usually fades within 1-2 weeks. Follow these instructions at home: Incision site care  Follow instructions from your health care provider about how to take care of your incision site. Make sure you: Wash your hands with soap and water for at least 20 seconds before and after you change your bandage (dressing). If soap and water are not available, use hand sanitizer. Remove your dressing in 24 hours. Leave stitches (sutures), skin glue, or adhesive strips in place. These skin closures may need to stay in place for 2 weeks or longer. If adhesive strip edges start to loosen and curl up, you may trim the loose edges. Do not remove adhesive strips completely unless your health care provider tells you to do that. Do not take baths, swim, or use a hot tub for at least 1 week. You may shower 24 hours after the procedure or as told by your health care provider. Gently wash the incision site with plain soap and water. Pat the area dry with a clean towel. Do not rub the site. This may cause bleeding. Do not apply powder or lotion to the site. Keep the site clean and dry. Check your femoral site every day for signs of infection. Check for: Redness, swelling, or pain. Fluid or blood. Warmth. Pus or a bad smell. Activity If you were given a sedative during the procedure, it can affect you for several hours. Do not drive or operate machinery until your health care provider says that it is safe. Rest as told by your health care provider. Avoid sitting for a long time without moving. Get up to take short walks every 1-2 hours. This  is important to improve blood flow and breathing. Ask for help if you feel weak or unsteady. Return to your normal activities as told by your health care provider. Ask your health care provider what activities are safe for you and when you can return to work. Avoid activities that take a lot of effort for the first 2-3 days after your procedure, or as long as directed. Do not lift anything that is heavier than 10 lb (4.5 kg), or the limit that you are told, until your health care provider says that it is safe. General instructions Take over-the-counter and prescription medicines only as told by your health care provider. If you will be going home right after the procedure, plan to have a responsible adult care for you for the time you are told. This is important. Keep all follow-up visits. This is important. Contact a health care provider if: You have a fever or chills. You have any of these signs of infection at your incision site: Redness, swelling, or pain. Fluid or blood. Warmth. Pus or a bad smell. Get help right away if: The incision area swells very fast. The incision area is bleeding, and the bleeding does not stop when you hold steady pressure on the area. Your leg or foot becomes pale, cool, tingly, or numb. These symptoms may represent a serious problem that is an emergency. Do not wait to see if the symptoms will go away. Get medical help right away. Call your local emergency  services (911 in the [place] Do not drive yourself to the hospital. Summary After the procedure, it is common to have bruising and tenderness that fade within 1-2 weeks. Check your femoral site every day for signs of infection. Do not lift anything that is heavier than 10 lb (4.5 kg), or the limit that you are told, until your health care provider says that it is safe. Get help right away if the incision area swells very fast, you have bleeding at the incision area that does not stop, or your leg or foot  becomes pale, cool, or numb. This information is not intended to replace advice given to you by your health care provider. Make sure you discuss any questions you have with your health care provider. Document Revised: 09/30/2020 Document Reviewed: 03/01/2020 Elsevier Patient Education  2024 [employer].

## 2023-12-01 NOTE — Op Note (Signed)
 DATE OF SERVICE: 12/01/2023  PATIENT:  Edgar Brewer  63 y.o. male  PRE-OPERATIVE DIAGNOSIS:  Atherosclerosis of native arteries of right lower extremity causing ulceration  POST-OPERATIVE DIAGNOSIS:  Same  PROCEDURE:   1) Ultrasound guided right common femoral artery access (CPT [phone]) 2) Aortogram (CPT 75625) 3) Runoff bilateral lower extremity angiogram (CPT 75716) 4) Additional right lower extremity angiogram with second order cannulation (CPT 36246) 5) Right femoropopliteal drug-coated angioplasty (CPT 37224) 6) Conscious sedation (33 minutes) (CPT 99152)  SURGEON:  Debby SAILOR. Magda, MD  ASSISTANT: none  ANESTHESIA:   local and IV sedation  ESTIMATED BLOOD LOSS: min  LOCAL MEDICATIONS USED:  LIDOCAINE    COUNTS: confirmed correct.  PATIENT DISPOSITION:  PACU - hemodynamically stable.   Delay start of Pharmacological VTE agent (>24hrs) due to surgical blood loss or risk of bleeding: no  INDICATION FOR PROCEDURE: Edgar Brewer is a 63 y.o. male with right foot ischemic ulceration. After careful discussion of risks, benefits, and alternatives the patient was offered angiogram. The patient understood and wished to proceed.  OPERATIVE FINDINGS:   Left renal artery: patent Right renal artery: patent  Infrarenal aorta: patent  Left common iliac artery: patent Right common iliac artery: patent  Left internal iliac artery: patent Right internal iliac artery: patent  Left external iliac artery: patent Right external iliac artery: patent  Left common femoral artery: no stenosis; branch of artery has ? Spontaneous AVF to femoral vein Right common femoral artery: mild plaque about 30% in distal artery  Left profunda femoris artery: no stenosis Right profunda femoris artery: no stenosis  Left superficial femoral artery: no stenosis Right superficial femoral artery: no stenosis  Left popliteal artery: focal ~50% stenosis at [facility] confluence Right popliteal artery: two areas of 60%  stenosis seen above and behind the knee  Left anterior tibial artery: patent Right anterior tibial artery: dominant, patent to foot  Left tibioperoneal trunk: patent Right tibioperoneal trunk: patent  Left peroneal artery: patent to ankle, smaller than AT Right peroneal artery: patent to ankle, smaller than AT  Left posterior tibial artery: diseased, but patent to ankle, smaller than AT Right posterior tibial artery: patent to ankle, smaller than AT  Left pedal circulation: severely disadvantaged Right pedal circulation: severely disadvantaged   GLASS score. Fp1. Ip0. P3. Stage 1.  WIfI score. 1 / 3 / 0. STAGE III.  DESCRIPTION OF PROCEDURE: After identification of the patient in the pre-operative holding area, the patient was transferred to the operating room. The patient was positioned supine on the operating room table. Anesthesia was induced. The groins was prepped and draped in standard fashion. A surgical pause was performed confirming correct patient, procedure, and operative location.  The left groin was anesthetized with subcutaneous injection of 1% lidocaine . Using ultrasound guidance, the left common femoral artery was accessed with micropuncture technique. Fluoroscopy was used to confirm cannulation over the femoral head. The 474F micropuncture sheath was upsized to 32F.   A Benson wire was advanced into the distal aorta. Over the wire an omni flush catheter was advanced to the level of L2. Aortogram was performed - see above for details. Bilateral lower extremity runoff angiogram was performed from catheter position in the aorta.   The right common iliac artery was selected with an omniflush catheter and glidewire guidewire. The wire was advanced into the popliteal artery. The decision was made to intervene. The patient was heparinized with 10,000 units of heparin . The 32F sheath was exchanged for a 74F x 45cm sheath.  Selective angiography of the right lower extremity performed prior to  intervention.   The lesions were treated with: femoropopliteal drug-coated angioplasty   Completion angiography revealed:  Resolution of popliteal stenosis  A perclose was used to close the arteriotomy. Hemostasis was excellent upon completion.   Conscious sedation was administered with the use of IV fentanyl  and midazolam  under continuous physician and nurse monitoring.  Heart rate, blood pressure, and oxygen saturation were continuously monitored.  Total sedation time was 33 minutes  Upon completion of the case instrument and sharps counts were confirmed correct. The patient was transferred to the PACU in good condition. I was present for all portions of the procedure.  PLAN: ASA / Plavix  / Statin. Right leg optimized. Follow up in 4 weeks with ABI and RLE arterial duplex.  Debby SAILOR. Magda, MD [facility] Phone Number: [phone] 12/01/2023 11:06 AM

## 2023-12-04 ENCOUNTER — Other Ambulatory Visit: Payer: Self-pay

## 2023-12-04 ENCOUNTER — Encounter (HOSPITAL_COMMUNITY): Payer: Self-pay | Admitting: Vascular Surgery

## 2023-12-04 DIAGNOSIS — I739 Peripheral vascular disease, unspecified: Secondary | ICD-10-CM

## 2023-12-12 ENCOUNTER — Ambulatory Visit (INDEPENDENT_AMBULATORY_CARE_PROVIDER_SITE_OTHER): Payer: Medicare (Managed Care) | Admitting: Podiatry

## 2023-12-12 DIAGNOSIS — M86272 Subacute osteomyelitis, left ankle and foot: Secondary | ICD-10-CM

## 2023-12-12 DIAGNOSIS — Z9889 Other specified postprocedural states: Secondary | ICD-10-CM

## 2023-12-12 DIAGNOSIS — I739 Peripheral vascular disease, unspecified: Secondary | ICD-10-CM

## 2023-12-12 NOTE — Progress Notes (Signed)
  Subjective:  Patient ID: Edgar Brewer, male    DOB: Aug 16, 1960,  MRN: [id number]  Chief Complaint  Patient presents with   Routine Post Op    f/uTransmetatarsal amputation left foot .0 pain. Macerated and drainage. Lateral foot discoloration. Triple antibiotic dressings.    DOS: 10/23/23 Procedure: 1.  Transmetatarsal amputation, left foot   63 y.o. male seen for post op check.  Patient presenting approximately 7 weeks out from left foot transmetatarsal amputation.  Patient is getting IV antibiotics with dialysis.  His mother has been doing dressing changes 3 times weekly.  Using antibiotic ointment and dry gauze.  Review of Systems: Negative except as noted in the HPI. Denies N/V/F/Ch.   Objective:   Constitutional Well developed. Well nourished.  Vascular Foot warm and well perfused. Capillary refill normal to all digits.   No calf pain with palpation  Neurologic Normal speech. Oriented to person, place, and time. Epicritic sensation diminished to left foot  Dermatologic Left foot amputation site has some superficial dehiscence especially laterally where there is necrotic tissue overlying healthy granular tissue upon debridement.  The wound probes down to subcutaneous fat tissue there is no bone exposed.  No evidence of infection just slow to heal wound with some necrotic and fibrotic tissues present in the wound bed     Right foot great toe has dorsal ulceration with some fibrotic eschar present in the wound bed but no evidence of erythema or drainage.    Orthopedic: S/p L foot TMA   Radiographs: Interval transmetatarsal amputation of the first through fifth digits.  Pathology:  A. FOOT, LEFT, TRANSMETATARSAL AMPUTATION:       Chronic osteomyelitis.       Subcutaneous tissue with congestion.       Skin ulcer and necrosis.       Bone margin is viable without evidence of acute osteomyelitis or  necrosis.   Micro: MODERATE ENTEROCOCCUS FAECALIS  FEW STENOTROPHOMONAS  MALTOPHILIA  FEW STAPHYLOCOCCUS AUREUS   Assessment:   1. Subacute osteomyelitis of left foot (HCC)   2. PAD (peripheral artery disease)   3. Post-operative state    Osteomyelitis and gangrene left foot s/p TMA Right foot ulceration dorsal hallux to subcutaneous fat tissue layer Plan:  Patient was evaluated and treated and all questions answered.  7 weeks s/p TMA left foot -Progressing with some dehiscence to fat layer noted laterally and centrally along the amputation site on the left foot.  Appears to be overall healing well though and does bleed well with debridement and no evidence of infection.  - Proceed with local wound care as below.  Debrided the necrotic and fibrotic tissues from the left foot amputation site today with tissue nipper and 15 blade. - Recommend we proceed with Monday Wednesday Friday dressing changes with silver alginate or calcium  alginate base layer followed by 4 x 4 gauze Kerlix Ace wrap.  Provided patient with a prism  order for wound supplies. -Recommend Monday Wednesday Friday Betadine and Band-Aid dressing to right great toe -XR: Deferred today -WB Status: NWB in post op shoe LLE, weightbearing as tolerated right foot in postop shoe -Sutures:   -Medications/ABX: On long-term IV antibiotics per ID -Dressing: Silver alginate and gauze dressing applied following debridement - F/u Plan: Patient to follow-up in 3 week        [name], DPM [facility] / [facility]

## 2023-12-18 ENCOUNTER — Other Ambulatory Visit: Payer: Self-pay | Admitting: Family Medicine

## 2023-12-21 ENCOUNTER — Encounter (HOSPITAL_COMMUNITY): Payer: Self-pay

## 2023-12-28 ENCOUNTER — Inpatient Hospital Stay: Payer: Medicare (Managed Care) | Admitting: Internal Medicine

## 2023-12-28 ENCOUNTER — Encounter (HOSPITAL_COMMUNITY): Payer: Self-pay

## 2024-01-02 ENCOUNTER — Encounter (HOSPITAL_COMMUNITY): Payer: Self-pay

## 2024-01-04 ENCOUNTER — Encounter: Payer: Self-pay | Admitting: Podiatry

## 2024-01-04 ENCOUNTER — Ambulatory Visit: Payer: Medicare (Managed Care) | Admitting: Podiatry

## 2024-01-04 ENCOUNTER — Ambulatory Visit: Payer: Medicare (Managed Care)

## 2024-01-04 DIAGNOSIS — M86272 Subacute osteomyelitis, left ankle and foot: Secondary | ICD-10-CM

## 2024-01-04 DIAGNOSIS — I739 Peripheral vascular disease, unspecified: Secondary | ICD-10-CM

## 2024-01-04 DIAGNOSIS — Z9889 Other specified postprocedural states: Secondary | ICD-10-CM

## 2024-01-04 NOTE — Progress Notes (Signed)
 Subjective:  Patient ID: Edgar Brewer, male    DOB: 24-Sep-1960,  MRN: [id number]  Chief Complaint  Patient presents with   Routine Post Op     f/u Transmetatarsal amputation, left foot. 0 pain. Tingling sensations on bottom of foot. Wearing surgical shoe. A1C 6.3.     DOS: 10/23/23 Procedure: 1.  Transmetatarsal amputation, left foot   63 y.o. male seen for post op check.  Patient presenting approximately 10 weeks out from left foot transmetatarsal amputation.  Patient is getting IV antibiotics with dialysis.  His mother has been doing dressing changes 3 times weekly.  Using antibiotic ointment and dry gauze.  Review of Systems: Negative except as noted in the HPI. Denies N/V/F/Ch.   Objective:   Constitutional Well developed. Well nourished.  Vascular Foot warm and well perfused. Capillary refill normal to all digits.   No calf pain with palpation  Neurologic Normal speech. Oriented to person, place, and time. Epicritic sensation diminished to left foot  Dermatologic Left foot amputation site has some superficial dehiscence especially laterally where there is necrotic tissue overlying healthy granular tissue upon debridement.  The wound probes down to subcutaneous fat tissue there is no bone exposed.  No evidence of infection just slow to heal wound with some necrotic and fibrotic tissues present in the wound bed  Overall the amputation site appears improved somewhat at this visit from prior with wound care that has been ongoing     Right foot great toe has dorsal ulceration with some fibrotic eschar present in the wound bed but no evidence of erythema or drainage.    Orthopedic: S/p L foot TMA   Radiographs: Interval transmetatarsal amputation of the first through fifth digits.  Pathology:  A. FOOT, LEFT, TRANSMETATARSAL AMPUTATION:       Chronic osteomyelitis.       Subcutaneous tissue with congestion.       Skin ulcer and necrosis.       Bone margin is viable without  evidence of acute osteomyelitis or  necrosis.   Micro: MODERATE ENTEROCOCCUS FAECALIS  FEW STENOTROPHOMONAS MALTOPHILIA  FEW STAPHYLOCOCCUS AUREUS   Assessment:   1. Subacute osteomyelitis of left foot (HCC)   2. PAD (peripheral artery disease)   3. Post-operative state     Osteomyelitis and gangrene left foot s/p TMA Right foot ulceration dorsal hallux to subcutaneous fat tissue layer Plan:  Patient was evaluated and treated and all questions answered.  10 weeks s/p TMA left foot -Progressing with some dehiscence to fat layer noted laterally and medially along the amputation site on the left foot.  Appears to be overall healing well though and does bleed well with debridement and no evidence of infection.  -Again debrided the lateral wound at this visit and placed a collagen dressing appears to be improving with wound care - Proceed with local wound care as below.  Debrided the necrotic and fibrotic tissues from the left foot amputation site today with tissue nipper and 15 blade. - Recommend we proceed with Monday Wednesday Friday dressing changes with wound wash spray to cleanse the wounds and then apply silver alginate or calcium  alginate base layer followed by 4 x 4 gauze Kerlix Ace wrap.   -Recommend Monday Wednesday Friday Betadine and Band-Aid dressing to right great toe -XR: Deferred today -WB Status: Transition to weightbearing in post op shoe LLE, weightbearing as tolerated right foot in regular shoe gear -Medications/ABX: On long-term IV antibiotics per ID -Dressing: Collagen and gauze dressing applied following  debridement - F/u Plan: Patient to follow-up in 3 week for wound check        [name], DPM [facility] / [facility]

## 2024-01-08 NOTE — Progress Notes (Unsigned)
 Office Note   History of Present Illness   Edgar Brewer is a 63 y.o. (1960/03/24) male who presents for surveillance of PAD. They have a history of ***  The patient returns today for follow up. He/she denies any recent medical history changes. The patient also denies any claudication, rest pain, or tissue loss of the lower extremities.  Current Outpatient Medications  Medication Sig Dispense Refill   aspirin  EC 81 MG tablet Take 81 mg by mouth daily. Swallow whole.     b complex-vitamin c -folic acid  (NEPHRO-VITE) 0.8 MG TABS tablet Take 1 tablet by mouth in the morning.     BD PEN NEEDLE SHORT ULTRAFINE 31G X 8 MM MISC USE TO INJECT INSULIN  INTO THE SKIN DAILY 100 each 0   cinacalcet  (SENSIPAR ) 60 MG tablet Take 60 mg by mouth every Tuesday, Thursday, and Saturday at 6 PM. In the evening.     clopidogrel  (PLAVIX ) 75 MG tablet Take 1 tablet (75 mg total) by mouth daily. 30 tablet 11   diphenhydramine -acetaminophen  (TYLENOL  PM) 25-500 MG TABS tablet Take 1 tablet by mouth at bedtime as needed (Sleep/Pain).     Docusate Calcium  (STOOL SOFTENER PO) Take 1-2 tablets by mouth 2 (two) times daily as needed (Constipation).     gabapentin  (NEURONTIN ) 100 MG capsule TAKE 1 CAPSULE BY MOUTH THREE TIMES DAILY 270 capsule 3   LANTUS  SOLOSTAR 100 UNIT/ML Solostar Pen Inject 50 Units into the skin daily.     omeprazole  (PRILOSEC) 20 MG capsule Take 1 capsule by mouth once daily 90 capsule 0   rosuvastatin  (CRESTOR ) 10 MG tablet Take 1 tablet by mouth once daily 90 tablet 0   sevelamer  carbonate (RENVELA ) 800 MG tablet Take 2 tablets (1,600 mg total) by mouth 3 (three) times daily with meals. (Patient taking differently: Take 2,400 mg by mouth 3 (three) times daily with meals.)     No current facility-administered medications for this visit.    ***REVIEW OF SYSTEMS (negative unless checked):   Cardiac:  []  Chest pain or chest pressure? []  Shortness of breath upon activity? []  Shortness of breath  when lying flat? []  Irregular heart rhythm?  Vascular:  []  Pain in calf, thigh, or hip brought on by walking? []  Pain in feet at night that wakes you up from your sleep? []  Blood clot in your veins? []  Leg swelling?  Pulmonary:  []  Oxygen at home? []  Productive cough? []  Wheezing?  Neurologic:  []  Sudden weakness in arms or legs? []  Sudden numbness in arms or legs? []  Sudden onset of difficult speaking or slurred speech? []  Temporary loss of vision in one eye? []  Problems with dizziness?  Gastrointestinal:  []  Blood in stool? []  Vomited blood?  Genitourinary:  []  Burning when urinating? []  Blood in urine?  Psychiatric:  []  Major depression  Hematologic:  []  Bleeding problems? []  Problems with blood clotting?  Dermatologic:  []  Rashes or ulcers?  Constitutional:  []  Fever or chills?  Ear/Nose/Throat:  []  Change in hearing? []  Nose bleeds? []  Sore throat?  Musculoskeletal:  []  Back pain? []  Joint pain? []  Muscle pain?   Physical Examination  ***There were no vitals filed for this visit. ***There is no height or weight on file to calculate BMI.  General:  WDWN in NAD; vital signs documented above Gait: Not observed HENT: WNL, normocephalic Pulmonary: normal non-labored breathing , without rales, rhonchi,  wheezing Cardiac: {Desc; regular/irreg:14544} HR, without murmurs {With/Without:20273} carotid bruit*** Abdomen: soft, NT, no masses Skin: {With/Without:20273}  rashes Vascular Exam/Pulses: Palpable/nonpalpable femoral pulses, palpable/nonpalpable popliteal pulses, palpable/nonpalpable pedal pulses. Left DP/PT/Peroneal doppler signals. Right DP/PT/Peroneal doppler signals Extremities: {With/Without:20273} ischemic changes, {With/Without:20273} gangrene , {With/Without:20273} cellulitis; {With/Without:20273} open wounds;  Musculoskeletal: no muscle wasting or atrophy  Neurologic: A&O X 3;  No focal weakness or paresthesias are detected Psychiatric:  The  pt has {Desc; normal/abnormal:11317::Normal} affect.  Non-Invasive Vascular imaging   ABI (***) R:  ABI: *** (***),  PT: {Signals:19197::none,mono,bi,tri} DP: {Signals:19197::none,mono,bi,tri} TBI:  *** L:  ABI: *** (***),  PT: {Signals:19197::none,mono,bi,tri} DP: {Signals:19197::none,mono,bi,tri} TBI: ***  Aortoiliac Duplex (***)   *** Arterial Duplex (***)   Medical Decision Making   Edgar Brewer is a 63 y.o. male who presents for surveillance of PAD  Based on the patient's vascular studies, their ABIs are essentially unchanged since last visit. *** Arterial duplex *** The patient denies any claudication, rest pain, or tissue loss. They have palpable/nonpalpable pedal pulses with *** doppler signals They will continue their *** and follow up with our office in *** months/year with ABIs and ***   Ahmed Holster PA-C [facility] Office: [phone]  Clinic MD: ***

## 2024-01-09 ENCOUNTER — Ambulatory Visit: Payer: Medicare (Managed Care) | Admitting: Physician Assistant

## 2024-01-09 ENCOUNTER — Ambulatory Visit (HOSPITAL_COMMUNITY): Admission: RE | Admit: 2024-01-09 | Discharge: 2024-01-09 | Payer: Medicare (Managed Care) | Attending: Vascular Surgery

## 2024-01-09 VITALS — BP 116/76 | HR 74 | Temp 98.0°F | Ht 68.0 in | Wt 208.0 lb

## 2024-01-09 DIAGNOSIS — I739 Peripheral vascular disease, unspecified: Secondary | ICD-10-CM

## 2024-01-09 DIAGNOSIS — I7025 Atherosclerosis of native arteries of other extremities with ulceration: Secondary | ICD-10-CM | POA: Diagnosis present

## 2024-01-09 LAB — VAS US ABI WITH/WO TBI

## 2024-01-10 ENCOUNTER — Other Ambulatory Visit: Payer: Self-pay

## 2024-01-10 DIAGNOSIS — I7025 Atherosclerosis of native arteries of other extremities with ulceration: Secondary | ICD-10-CM

## 2024-01-11 ENCOUNTER — Ambulatory Visit: Payer: Medicare (Managed Care)

## 2024-01-11 VITALS — BP 118/76 | Ht 68.0 in | Wt 208.0 lb

## 2024-01-11 DIAGNOSIS — Z0000 Encounter for general adult medical examination without abnormal findings: Secondary | ICD-10-CM | POA: Diagnosis not present

## 2024-01-11 NOTE — Patient Instructions (Signed)
 [name],  Thank you for taking the time for your Medicare Wellness Visit. I appreciate your continued commitment to your health goals. Please review the care plan we discussed, and feel free to reach out if I can assist you further.  Please note that Annual Wellness Visits do not include a physical exam. Some assessments may be limited, especially if the visit was conducted virtually. If needed, we may recommend an in-person follow-up with your provider.  Ongoing Care Seeing your primary care provider every 3 to 6 months helps us  monitor your health and provide consistent, personalized care.   Referrals If a referral was made during today's visit and you haven't received any updates within two weeks, please contact the referred provider directly to check on the status.  Recommended Screenings:  Health Maintenance  Topic Date Due   Zoster (Shingles) Vaccine (1 of 2) Never done   Medicare Annual Wellness Visit  04/19/2023   Eye exam for diabetics  05/03/2023   COVID-19 Vaccine (4 - 2025-26 season) 09/25/2023   Screening for Lung Cancer  11/15/2024*   Complete foot exam   02/07/2024   Hemoglobin A1C  04/17/2024   DTaP/Tdap/Td vaccine (3 - Td or Tdap) 06/10/2025   Pneumococcal Vaccine for age over 29  Completed   Flu Shot  Completed   Hepatitis B Vaccine  Completed   Hepatitis C Screening  Completed   HIV Screening  Completed   HPV Vaccine  Aged Out   Meningitis B Vaccine  Aged Out   Colon Cancer Screening  Discontinued  *Topic was postponed. The date shown is not the original due date.       12/01/2023    8:23 AM  Advanced Directives  Does Patient Have a Medical Advance Directive? No  Would patient like information on creating a medical advance directive? No - Patient declined    Vision: Annual vision screenings are recommended for early detection of glaucoma, cataracts, and diabetic retinopathy. These exams can also reveal signs of chronic conditions such as diabetes and high  blood pressure.  Dental: Annual dental screenings help detect early signs of oral cancer, gum disease, and other conditions linked to overall health, including heart disease and diabetes.  Please see the attached documents for additional preventive care recommendations.

## 2024-01-11 NOTE — Progress Notes (Signed)
 Chief Complaint  Patient presents with   Medicare Wellness     Subjective:   Edgar Brewer is a 63 y.o. male who presents for a Medicare Annual Wellness Visit.  Visit info / Clinical Intake: Medicare Wellness Visit Type:: Subsequent Annual Wellness Visit Persons participating in visit and providing information:: patient Medicare Wellness Visit Mode:: Telephone If telephone:: video declined Since this visit was completed virtually, some vitals may be partially provided or unavailable. Missing vitals are due to the limitations of the virtual format.: Documented vitals are patient reported If Telephone or Video please confirm:: I connected with patient using audio/video enable telemedicine. I verified patient identity with two identifiers, discussed telehealth limitations, and patient agreed to proceed. Patient Location:: home Provider Location:: office Interpreter Needed?: No Pre-visit prep was completed: yes AWV questionnaire completed by patient prior to visit?: no Living arrangements:: with family/others Patient's Overall Health Status Rating: very good Typical amount of pain: some Does pain affect daily life?: no Are you currently prescribed opioids?: no  Dietary Habits and Nutritional Risks How many meals a day?: 3 Eats fruit and vegetables daily?: yes Most meals are obtained by: having others provide food In the last 2 weeks, have you had any of the following?: none Diabetic:: (!) yes Any non-healing wounds?: (!) yes (both feet) How often do you check your BS?: 1 Would you like to be referred to a Nutritionist or for Diabetic Management? : no  Functional Status Activities of Daily Living (to include ambulation/medication): Independent Ambulation: Independent with device- listed below Home Assistive Devices/Equipment: Eyeglasses; Johna Finder (specify Type) Medication Administration: Independent Home Management (perform basic housework or laundry): Independent Manage  your own finances?: (!) no (mother does financal) Primary transportation is: family / friends Concerns about vision?: (!) yes (can't see as well as like to) Concerns about hearing?: no  Fall Screening Falls in the past year?: 0 Number of falls in past year: 0 Was there an injury with Fall?: 0 Fall Risk Category Calculator: 0 Patient Fall Risk Level: Low Fall Risk  Fall Risk Patient at Risk for Falls Due to: Impaired balance/gait; Impaired mobility; Impaired vision Fall risk Follow up: Falls evaluation completed  Home and Transportation Safety: All rugs have non-skid backing?: (!) no All stairs or steps have railings?: yes Grab bars in the bathtub or shower?: yes Have non-skid surface in bathtub or shower?: yes Good home lighting?: yes Regular seat belt use?: yes Hospital stays in the last year:: (!) yes How many hospital stays:: 1 Reason: foot surgery  Cognitive Assessment Difficulty concentrating, remembering, or making decisions? : no Will 6CIT or Mini Cog be Completed: yes What year is it?: 0 points What month is it?: 0 points Give patient an address phrase to remember (5 components): [address] [place]  About what time is it?: 0 points Count backwards from 20 to 1: 0 points Say the months of the year in reverse: 0 points Repeat the address phrase from earlier: 0 points 6 CIT Score: 0 points  Advance Directives (For Healthcare) Does Patient Have a Medical Advance Directive?: No Would patient like information on creating a medical advance directive?: No - Patient declined  Reviewed/Updated  Reviewed/Updated: Reviewed All (Medical, Surgical, Family, Medications, Allergies, Care Teams, Patient Goals)    Allergies (verified) Codeine and Clindamycin /lincomycin   Current Medications (verified) Outpatient Encounter Medications as of 01/11/2024  Medication Sig   aspirin  EC 81 MG tablet Take 81 mg by mouth daily. Swallow whole.   b complex-vitamin c -folic  acid  (NEPHRO-VITE) 0.8 MG TABS tablet Take 1 tablet by mouth in the morning.   BD PEN NEEDLE SHORT ULTRAFINE 31G X 8 MM MISC USE TO INJECT INSULIN  INTO THE SKIN DAILY   cinacalcet  (SENSIPAR ) 60 MG tablet Take 60 mg by mouth every Tuesday, Thursday, and Saturday at 6 PM. In the evening.   clopidogrel  (PLAVIX ) 75 MG tablet Take 1 tablet (75 mg total) by mouth daily.   diphenhydramine -acetaminophen  (TYLENOL  PM) 25-500 MG TABS tablet Take 1 tablet by mouth at bedtime as needed (Sleep/Pain).   Docusate Calcium  (STOOL SOFTENER PO) Take 1-2 tablets by mouth 2 (two) times daily as needed (Constipation).   gabapentin  (NEURONTIN ) 100 MG capsule TAKE 1 CAPSULE BY MOUTH THREE TIMES DAILY   LANTUS  SOLOSTAR 100 UNIT/ML Solostar Pen Inject 50 Units into the skin daily.   omeprazole  (PRILOSEC) 20 MG capsule Take 1 capsule by mouth once daily   rosuvastatin  (CRESTOR ) 10 MG tablet Take 1 tablet by mouth once daily   sevelamer  carbonate (RENVELA ) 800 MG tablet Take 2 tablets (1,600 mg total) by mouth 3 (three) times daily with meals.   No facility-administered encounter medications on file as of 01/11/2024.    History: Past Medical History:  Diagnosis Date   Acute on chronic combined systolic and diastolic CHF, NYHA class 1 (HCC) 01/13/2015   Allergy    Arthritis    Chronic kidney disease    M[facility].   Diabetes mellitus type 2 with complications (HCC)    Previous insulin , non-insulin  requiring noted 09/2018.  Complications include retinopathy, peripheral neuropathy.   GERD (gastroesophageal reflux disease)    at times   Head injury with loss of consciousness (HCC)    History of kidney stones    Hypertension    Neuromuscular disorder (HCC)    Neuropathy    Pulmonary hypertension (HCC)    PA peak pressure 41 mmHg 03/04/16 echo   Stroke [facility])    no residual weakness   Varicose veins of lower extremities with complications 12/24/2014   Past Surgical History:  Procedure Laterality Date   ABDOMINAL  AORTOGRAM W/LOWER EXTREMITY N/A 12/01/2023   Procedure: ABDOMINAL AORTOGRAM W/LOWER EXTREMITY;  Surgeon: Magda Debby SAILOR, MD;  Location: [facility] INVASIVE CV LAB;  Service: Cardiovascular;  Laterality: N/A;   AMPUTATION TOE Left 09/21/2023   Procedure: AMPUTATION, TOE;  Surgeon: Malvin Marsa FALCON, DPM;  Location: [facility] OR;  Service: Orthopedics/Podiatry;  Laterality: Left;  Left 2nd toe amputation   AV FISTULA PLACEMENT Left 06/01/2016   Procedure: ARTERIOVENOUS (AV) FISTULA CREATION;  Surgeon: Oris Krystal FALCON, MD;  Location: [facility] OR;  Service: Vascular;  Laterality: Left;   AV FISTULA PLACEMENT Left 08/10/2017   Procedure: BRACHIOCEPHALIC ARTERIOVENOUS (AV) FISTULA CREATION LEFT UPPER EXTREMITY;  Surgeon: Eliza Lonni RAMAN, MD;  Location: [facility] OR;  Service: Vascular;  Laterality: Left;   CHOLECYSTECTOMY N/A 10/04/2018   Procedure: LAPAROSCOPIC CHOLECYSTECTOMY;  Surgeon: Stevie Herlene Righter, MD;  Location: [facility] OR;  Service: General;  Laterality: N/A;   COLONOSCOPY W/ POLYPECTOMY  03/2016   For evaluation of iron deficiency anemia.  Dr. Shila.  10 polyps, largest 20 mm.  Pathology: tubular adenomas with no high-grade dysplasia, hyperplastic.  None bleeding internal hemorrhoids.  Pandiverticulosis.   CYSTOSCOPY WITH RETROGRADE PYELOGRAM, URETEROSCOPY AND STENT PLACEMENT Bilateral 02/25/2021   Procedure: CYSTOSCOPY WITH BILATERAL  RETROGRADE PYELOGRAM, RIGHT DIAGNOSTIC URETEROSCOPY,  BLADDER BIOPSY WITH FULGERATION;  Surgeon: Alvaro Hummer, MD;  Location: [facility] ORS;  Service: Urology;  Laterality: Bilateral;   ENDOVENOUS  ABLATION SAPHENOUS VEIN W/ LASER Left 07-23-2015   endovenous laser ablation left greater saphenous vein by Krystal Doing MD   ENDOVENOUS ABLATION SAPHENOUS VEIN W/ LASER Right 10/15/2015   EVLA right greater saphenous vein by Krystal Doing MD   ESOPHAGOGASTRODUODENOSCOPY  03/2016   For evaluation of IDA.  Dr. Shila.  Duodenal erythema, pathology benign mucosa, no villous atrophy.    GALLBLADDER SURGERY  10/2018   HOLMIUM LASER APPLICATION Right 02/25/2021   Procedure: HOLMIUM LASER OF TUMOR;  Surgeon: Alvaro Hummer, MD;  Location: [facility] ORS;  Service: Urology;  Laterality: Right;   IR RADIOLOGIST EVAL & MGMT  11/29/2018   LOWER EXTREMITY ANGIOGRAPHY N/A 09/20/2023   Procedure: Lower Extremity Angiography;  Surgeon: Magda Debby SAILOR, MD;  Location: [facility] INVASIVE CV LAB;  Service: Cardiovascular;  Laterality: N/A;   LOWER EXTREMITY ANGIOGRAPHY N/A 12/01/2023   Procedure: Lower Extremity Angiography;  Surgeon: Magda Debby SAILOR, MD;  Location: [facility] INVASIVE CV LAB;  Service: Cardiovascular;  Laterality: N/A;   LOWER EXTREMITY INTERVENTION Left 09/20/2023   Procedure: LOWER EXTREMITY INTERVENTION;  Surgeon: Magda Debby SAILOR, MD;  Location: [facility];  Service: Cardiovascular;  Laterality: Left;   LOWER EXTREMITY INTERVENTION N/A 12/01/2023   Procedure: LOWER EXTREMITY INTERVENTION;  Surgeon: Magda Debby SAILOR, MD;  Location: [facility] INVASIVE CV LAB;  Service: Cardiovascular;  Laterality: N/A;   TEE WITHOUT CARDIOVERSION N/A 11/19/2018   Procedure: TRANSESOPHAGEAL ECHOCARDIOGRAM (TEE);  Surgeon: Alveta Aleene PARAS, MD;  Location: [facility] ENDOSCOPY;  Service: Cardiovascular;  Laterality: N/A;   TRANSESOPHAGEAL ECHOCARDIOGRAM (CATH LAB) N/A 11/01/2023   Procedure: TRANSESOPHAGEAL ECHOCARDIOGRAM;  Surgeon: Floretta Mallard, MD;  Location: [facility] INVASIVE CV LAB;  Service: Cardiovascular;  Laterality: N/A;   TRANSMETATARSAL AMPUTATION Left 10/23/2023   Procedure: AMPUTATION, FOOT, TRANSMETATARSAL;  Surgeon: Malvin Marsa FALCON, DPM;  Location: [facility] OR;  Service: Orthopedics/Podiatry;  Laterality: Left;   Family History  Problem Relation Age of Onset   Arthritis Mother    Hearing loss Father    Prostate cancer Father    Hyperlipidemia Maternal Grandmother    Heart disease Maternal Grandmother    Lung cancer Maternal Grandfather    Social History   Occupational History   Not on file  Tobacco  Use   Smoking status: Former    Current packs/day: 0.00    Average packs/day: 1 pack/day for 45.0 years (45.0 ttl pk-yrs)    Types: Cigarettes    Start date: 01/12/1970    Quit date: 01/13/2015    Years since quitting: 9.0   Smokeless tobacco: Never  Vaping Use   Vaping status: Never Used  Substance and Sexual Activity   Alcohol use: Yes    Alcohol/week: 1.0 standard drink of alcohol    Types: 1 Glasses of wine per week    Comment: rare occassion   Drug use: No   Sexual activity: Not Currently   Tobacco Counseling Counseling given: Not Answered  SDOH Screenings   Food Insecurity: No Food Insecurity (01/11/2024)  Housing: Low Risk (01/11/2024)  Transportation Needs: No Transportation Needs (01/11/2024)  Utilities: Not At Risk (01/11/2024)  Alcohol Screen: Low Risk (01/11/2024)  Depression (PHQ2-9): Low Risk (01/11/2024)  Financial Resource Strain: Low Risk (04/19/2022)  Physical Activity: Inactive (01/11/2024)  Social Connections: Socially Isolated (01/11/2024)  Stress: No Stress Concern Present (01/11/2024)  Tobacco Use: Medium Risk (01/11/2024)  Health Literacy: Adequate Health Literacy (01/11/2024)   See flowsheets for full screening details  Depression Screen PHQ 2 & 9 Depression Scale- Over the past 2  weeks, how often have you been bothered by any of the following problems? Little interest or pleasure in doing things: 0 Feeling down, depressed, or hopeless (PHQ Adolescent also includes...irritable): 0 PHQ-2 Total Score: 0 Trouble falling or staying asleep, or sleeping too much: 0 Feeling tired or having little energy: 0 Poor appetite or overeating (PHQ Adolescent also includes...weight loss): 0 Feeling bad about yourself - or that you are a failure or have let yourself or your family down: 0 Trouble concentrating on things, such as reading the newspaper or watching television (PHQ Adolescent also includes...like school work): 0 Moving or speaking so slowly that  other people could have noticed. Or the opposite - being so fidgety or restless that you have been moving around a lot more than usual: 0 Thoughts that you would be better off dead, or of hurting yourself in some way: 0 PHQ-9 Total Score: 0 If you checked off any problems, how difficult have these problems made it for you to do your work, take care of things at home, or get along with other people?: Not difficult at all  Depression Treatment Depression Interventions/Treatment : [id number] Score <4 Follow-up Not Indicated     Goals Addressed               This Visit's Progress     maintain health (pt-stated)        Maintain health              Objective:    Today's Vitals   01/11/24 1541  BP: 118/76  Weight: 208 lb (94.3 kg)  Height: 5' 8 (1.727 m)   Body mass index is 31.63 kg/m.  Hearing/Vision screen Hearing Screening - Comments:: Pt denies any hearing issues  Vision Screening - Comments:: Wears rx glasses - up to date with routine eye exams with Dr Jarold  Immunizations and Health Maintenance Health Maintenance  Topic Date Due   Zoster Vaccines- Shingrix (1 of 2) Never done   OPHTHALMOLOGY EXAM  05/03/2023   COVID-19 Vaccine (4 - 2025-26 season) 09/25/2023   Lung Cancer Screening  11/15/2024 (Originally 11/15/2019)   FOOT EXAM  02/07/2024   HEMOGLOBIN A1C  04/17/2024   Medicare Annual Wellness (AWV)  01/10/2025   DTaP/Tdap/Td (3 - Td or Tdap) 06/10/2025   Pneumococcal Vaccine: 50+ Years  Completed   Influenza Vaccine  Completed   Hepatitis B Vaccines 19-59 Average Risk  Completed   Hepatitis C Screening  Completed   HIV Screening  Completed   HPV VACCINES  Aged Out   Meningococcal B Vaccine  Aged Out   Colonoscopy  Discontinued        Assessment/Plan:  This is a routine wellness examination for Reef.  Patient Care Team: Katrinka Garnette KIDD, MD as PCP - General (Family Medicine) Delford Maude BROCKS, MD as PCP - Cardiology (Cardiology) Jarold Mayo,  MD as Consulting Physician (Ophthalmology) Center, [facility] Dolan Mateo Larger, MD as Consulting Physician (Nephrology)  I have personally reviewed and noted the following in the patients chart:   Medical and social history Use of alcohol, tobacco or illicit drugs  Current medications and supplements including opioid prescriptions. Functional ability and status Nutritional status Physical activity Advanced directives List of other physicians Hospitalizations, surgeries, and ER visits in previous 12 months Vitals Screenings to include cognitive, depression, and falls Referrals and appointments  No orders of the defined types were placed in this encounter.  In addition, I have reviewed and discussed with patient certain preventive  protocols, quality metrics, and best practice recommendations. A written personalized care plan for preventive services as well as general preventive health recommendations were provided to patient.   Ellouise VEAR Haws, LPN   [id number]/81/[id number]   Return in 1 year (on 01/10/2025).  After Visit Summary: (MyChart) Due to this being a telephonic visit, the after visit summary with patients personalized plan was offered to patient via MyChart   Nurse Notes: HM Addressed: Pt stated eye appt with dr sanders is next week

## 2024-01-15 ENCOUNTER — Ambulatory Visit: Payer: Medicare (Managed Care) | Admitting: Family Medicine

## 2024-01-15 ENCOUNTER — Encounter (HOSPITAL_COMMUNITY): Payer: Self-pay

## 2024-01-15 VITALS — BP 112/56 | HR 88 | Temp 99.9°F | Resp 16 | Ht 68.0 in | Wt 208.2 lb

## 2024-01-15 DIAGNOSIS — N186 End stage renal disease: Secondary | ICD-10-CM | POA: Diagnosis not present

## 2024-01-15 DIAGNOSIS — J101 Influenza due to other identified influenza virus with other respiratory manifestations: Secondary | ICD-10-CM

## 2024-01-15 DIAGNOSIS — Z992 Dependence on renal dialysis: Secondary | ICD-10-CM

## 2024-01-15 LAB — POC COVID19 BINAXNOW: SARS Coronavirus 2 Ag: NEGATIVE

## 2024-01-15 LAB — POCT RAPID STREP A (OFFICE): Rapid Strep A Screen: NEGATIVE

## 2024-01-15 LAB — POC INFLUENZA A&B (BINAX/QUICKVUE)
Influenza A, POC: POSITIVE — AB
Influenza B, POC: POSITIVE — AB

## 2024-01-15 MED ORDER — OSELTAMIVIR PHOSPHATE 30 MG PO CAPS: ORAL_CAPSULE | ORAL | 0 refills | Status: DC

## 2024-01-15 NOTE — Patient Instructions (Signed)
 Please follow up if symptoms do not improve or as needed.     VISIT SUMMARY: Today, you were seen for flu-like symptoms that have been ongoing since Friday. You have experienced a low-grade fever, decreased appetite, difficulty breathing, congestion, sore throat, chest discomfort, and fluctuating mental status. You also missed your Sunday dialysis session due to illness but plan to attend tomorrow.  YOUR PLAN: -INFLUENZA A INFECTION: You have been diagnosed with an acute Influenza A infection, which is a viral infection that affects your respiratory system. To treat this, you have been prescribed Tamiflu . For your cough, you can take Robitussin or Robitussin Mucinex  DM. Make sure to get plenty of rest and drink more fluids. You can take Tylenol  if you have a fever. We have also coordinated with your primary care physician to provide a preventive dose of Tamiflu  for your family members.  INSTRUCTIONS: Please follow the prescribed treatment plan and attend your dialysis session tomorrow as scheduled. If your symptoms worsen or you have any concerns, contact your healthcare provider immediately.                      Contains text generated by Abridge.                                 Contains text generated by Abridge.

## 2024-01-15 NOTE — Progress Notes (Signed)
 "  Subjective  CC:  Chief Complaint  Patient presents with   Sore Throat    Started Friday and got worse and he has congestion.    Same day acute visit; PCP not available. New pt to me. Chart reviewed.   HPI: Edgar Brewer is a 63 y.o. male who presents to the office today to address the problems listed above in the chief complaint. Discussed the use of AI scribe software for clinical note transcription with the patient, who gave verbal consent to proceed.  History of Present Illness Edgar Brewer is a 63 year old male on dialysis who presents with flu-like symptoms. He is accompanied by his mother, Anay Rathe.  Constitutional and flu-like symptoms - Flu-like symptoms since Friday; he did take the flu vaccine this season. - Low-grade fever, temperatures ranging from 70F over the weekend to 23F today - Decreased appetite - Generalized malaise, feeling 'out of it' and not acting like himself  Respiratory symptoms - Difficulty breathing: feels congested w/ some cough - Congestion - Sore throat - Chest discomfort without pain on breathing - No known lung disease - No inhaler use  Altered mental status - Fluctuating mental status, with periods of being 'in and out' according to this mother; he is himself now  Dialysis management - On dialysis, usual schedule Tuesday, Wednesday, and Friday - Missed Sunday dialysis session due to illness - Plans to attend dialysis tomorrow  Symptomatic treatment - Taking robitussin over the weekend to manage symptoms   Assessment  1. Influenza A   2. ESRD on dialysis [facility])      Plan  Assessment and Plan Assessment & Plan Influenza A infection Acute Influenza A infection confirmed by positive test. Symptoms include congestion, sore throat, difficulty breathing, low-grade fever, and altered mental status. No significant lung disease or inhaler use. Oxygen levels are stable. Current flu vaccine is not effective against this strain. -  Prescribed Tamiflu  for influenza treatment. Adjusted dosing due to ESRD - Recommended Robitussin or Robitussin Mucinex  DM for cough management. - Advised rest and increased fluid intake. - Suggested Tylenol  for fever management if needed. - Coordinated with primary care physician for preventive Tamiflu  dose for family members.  Missed dialysis yesterday so will go tomorrow. Appears stable volume status. Mentation is normal.   Of note, I sent in tamiflu  prophylaxis for hilda Castelo, his mother because they live together.   Follow up: prn Orders Placed This Encounter  Procedures   POC COVID-19   POCT rapid strep A   POC Influenza A&B (Binax test)   Meds ordered this encounter  Medications   oseltamivir  (TAMIFLU ) 30 MG capsule    Sig: Take 30mg  today, then take 30mg  3x/wk after dialysis x 5 doses    Dispense:  6 capsule    Refill:  0     I reviewed the patients updated PMH, FH, and SocHx.  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   Abnormal echocardiogram 10/31/2023   Bradycardia 10/30/2023   Pyogenic inflammation of bone (HCC) 10/20/2023   PAD (peripheral artery disease) 10/20/2023   Neuropathy    Sepsis (HCC) 10/19/2023   Hyperkalemia 09/20/2023   Hyponatremia 09/20/2023   Gangrene of left foot (HCC) 09/19/2023   Fatty liver 02/10/2022   Wound of left leg 05/08/2020   Left leg cellulitis 05/08/2020   GERD (gastroesophageal reflux disease) 03/25/2020   Aortic stenosis 02/25/2020   Type 2 diabetes mellitus with diabetic  neuropathy, unspecified (HCC) 02/25/2020   History of stroke 02/25/2020   Gallbladder abscess 11/15/2018   Elevated LFTs 10/18/2018   ESRD on dialysis (HCC) 10/03/2018   Other disorders of phosphorus metabolism 01/19/2018   Secondary hyperparathyroidism of renal origin 09/27/2017   CKD (chronic kidney disease), stage IV (HCC) 01/14/2015   Diabetes mellitus type 2 with complications (HCC) 01/14/2015    Obesity 01/14/2015   Essential hypertension 01/14/2015   Anemia of chronic kidney failure 01/14/2015   Chronic diastolic CHF (congestive heart failure) (HCC) 01/13/2015   Varicose veins of lower extremities with complications 12/24/2014   Chronic venous insufficiency 10/22/2014   Active Medications[1] Allergies: Patient is allergic to codeine and clindamycin /lincomycin. Family History: Patient family history includes Arthritis in his mother; Hearing loss in his father; Heart disease in his maternal grandmother; Hyperlipidemia in his maternal grandmother; Lung cancer in his maternal grandfather; Prostate cancer in his father. Social History:  Patient  reports that he quit smoking about 9 years ago. His smoking use included cigarettes. He started smoking about 54 years ago. He has a 45 pack-year smoking history. He has never used smokeless tobacco. He reports current alcohol use of about 1.0 standard drink of alcohol per week. He reports that he does not use drugs.  Review of Systems: Constitutional: Negative for fever malaise or anorexia Cardiovascular: negative for chest pain Respiratory: negative for SOB or persistent cough Gastrointestinal: negative for abdominal pain  Objective  Vitals: BP (!) 112/56   Pulse 88   Temp 99.9 F (37.7 C) (Temporal)   Resp 16   Ht 5' 8 (1.727 m)   Wt 208 lb 3.2 oz (94.4 kg)   SpO2 95%   BMI 31.66 kg/m  General: no acute distress , A&Ox3, non toxic appearing in a wheel chair HEENT: PEERL, conjunctiva normal, neck is supple, op is red. No respiratory distress Cardiovascular:  RRR without murmur or gallop.  Respiratory:  fairly good breath sounds bilaterally, mild exp rhonchi. No rales Soft nontender abdomen Skin:  Warm, no rashes Commons side effects, risks, benefits, and alternatives for medications and treatment plan prescribed today were discussed, and the patient expressed understanding of the given instructions. Patient is instructed to call  or message via MyChart if he/she has any questions or concerns regarding our treatment plan. No barriers to understanding were identified. We discussed Red Flag symptoms and signs in detail. Patient expressed understanding regarding what to do in case of urgent or emergency type symptoms.  Medication list was reconciled, printed and provided to the patient in AVS. Patient instructions and summary information was reviewed with the patient as documented in the AVS. This note was prepared with assistance of Dragon voice recognition software. Occasional wrong-word or sound-a-like substitutions may have occurred due to the inherent limitations of voice recognition software    [1]  Current Meds  Medication Sig   aspirin  EC 81 MG tablet Take 81 mg by mouth daily. Swallow whole.   b complex-vitamin c -folic acid  (NEPHRO-VITE) 0.8 MG TABS tablet Take 1 tablet by mouth in the morning.   BD PEN NEEDLE SHORT ULTRAFINE 31G X 8 MM MISC USE TO INJECT INSULIN  INTO THE SKIN DAILY   cinacalcet  (SENSIPAR ) 60 MG tablet Take 60 mg by mouth every Tuesday, Thursday, and Saturday at 6 PM. In the evening.   clopidogrel  (PLAVIX ) 75 MG tablet Take 1 tablet (75 mg total) by mouth daily.   diphenhydramine -acetaminophen  (TYLENOL  PM) 25-500 MG TABS tablet Take 1 tablet by mouth at bedtime as  needed (Sleep/Pain).   Docusate Calcium  (STOOL SOFTENER PO) Take 1-2 tablets by mouth 2 (two) times daily as needed (Constipation).   gabapentin  (NEURONTIN ) 100 MG capsule TAKE 1 CAPSULE BY MOUTH THREE TIMES DAILY   LANTUS  SOLOSTAR 100 UNIT/ML Solostar Pen Inject 50 Units into the skin daily.   omeprazole  (PRILOSEC) 20 MG capsule Take 1 capsule by mouth once daily   oseltamivir  (TAMIFLU ) 30 MG capsule Take 30mg  today, then take 30mg  3x/wk after dialysis x 5 doses   rosuvastatin  (CRESTOR ) 10 MG tablet Take 1 tablet by mouth once daily   sevelamer  carbonate (RENVELA ) 800 MG tablet Take 2 tablets (1,600 mg total) by mouth 3 (three) times daily  with meals.   "

## 2024-01-16 ENCOUNTER — Emergency Department (HOSPITAL_COMMUNITY): Payer: Medicare (Managed Care)

## 2024-01-16 ENCOUNTER — Inpatient Hospital Stay (HOSPITAL_COMMUNITY)
Admission: EM | Admit: 2024-01-16 | Discharge: 2024-01-18 | DRG: 193 | Disposition: A | Discharge status: Home Health | Payer: Medicare (Managed Care) | Attending: Internal Medicine | Admitting: Internal Medicine

## 2024-01-16 ENCOUNTER — Encounter (HOSPITAL_COMMUNITY): Payer: Self-pay

## 2024-01-16 DIAGNOSIS — N2581 Secondary hyperparathyroidism of renal origin: Secondary | ICD-10-CM | POA: Diagnosis present

## 2024-01-16 DIAGNOSIS — Z8042 Family history of malignant neoplasm of prostate: Secondary | ICD-10-CM

## 2024-01-16 DIAGNOSIS — K219 Gastro-esophageal reflux disease without esophagitis: Secondary | ICD-10-CM | POA: Diagnosis not present

## 2024-01-16 DIAGNOSIS — J101 Influenza due to other identified influenza virus with other respiratory manifestations: Principal | ICD-10-CM | POA: Diagnosis present

## 2024-01-16 DIAGNOSIS — Z8261 Family history of arthritis: Secondary | ICD-10-CM

## 2024-01-16 DIAGNOSIS — R4701 Aphasia: Secondary | ICD-10-CM | POA: Diagnosis not present

## 2024-01-16 DIAGNOSIS — Z885 Allergy status to narcotic agent status: Secondary | ICD-10-CM

## 2024-01-16 DIAGNOSIS — Z860101 Personal history of adenomatous and serrated colon polyps: Secondary | ICD-10-CM

## 2024-01-16 DIAGNOSIS — N186 End stage renal disease: Secondary | ICD-10-CM | POA: Diagnosis present

## 2024-01-16 DIAGNOSIS — Z7982 Long term (current) use of aspirin: Secondary | ICD-10-CM

## 2024-01-16 DIAGNOSIS — Z801 Family history of malignant neoplasm of trachea, bronchus and lung: Secondary | ICD-10-CM

## 2024-01-16 DIAGNOSIS — I2510 Atherosclerotic heart disease of native coronary artery without angina pectoris: Secondary | ICD-10-CM | POA: Diagnosis present

## 2024-01-16 DIAGNOSIS — E1122 Type 2 diabetes mellitus with diabetic chronic kidney disease: Secondary | ICD-10-CM | POA: Diagnosis present

## 2024-01-16 DIAGNOSIS — Z881 Allergy status to other antibiotic agents status: Secondary | ICD-10-CM

## 2024-01-16 DIAGNOSIS — E871 Hypo-osmolality and hyponatremia: Secondary | ICD-10-CM | POA: Diagnosis present

## 2024-01-16 DIAGNOSIS — I10 Essential (primary) hypertension: Secondary | ICD-10-CM | POA: Diagnosis not present

## 2024-01-16 DIAGNOSIS — E1140 Type 2 diabetes mellitus with diabetic neuropathy, unspecified: Secondary | ICD-10-CM | POA: Diagnosis present

## 2024-01-16 DIAGNOSIS — N184 Chronic kidney disease, stage 4 (severe): Secondary | ICD-10-CM | POA: Diagnosis not present

## 2024-01-16 DIAGNOSIS — Z7902 Long term (current) use of antithrombotics/antiplatelets: Secondary | ICD-10-CM

## 2024-01-16 DIAGNOSIS — Z89412 Acquired absence of left great toe: Secondary | ICD-10-CM

## 2024-01-16 DIAGNOSIS — E669 Obesity, unspecified: Secondary | ICD-10-CM | POA: Diagnosis not present

## 2024-01-16 DIAGNOSIS — Z8249 Family history of ischemic heart disease and other diseases of the circulatory system: Secondary | ICD-10-CM

## 2024-01-16 DIAGNOSIS — E66811 Obesity, class 1: Secondary | ICD-10-CM | POA: Diagnosis present

## 2024-01-16 DIAGNOSIS — I5032 Chronic diastolic (congestive) heart failure: Secondary | ICD-10-CM | POA: Diagnosis not present

## 2024-01-16 DIAGNOSIS — Z83438 Family history of other disorder of lipoprotein metabolism and other lipidemia: Secondary | ICD-10-CM

## 2024-01-16 DIAGNOSIS — Z6830 Body mass index (BMI) 30.0-30.9, adult: Secondary | ICD-10-CM

## 2024-01-16 DIAGNOSIS — I739 Peripheral vascular disease, unspecified: Secondary | ICD-10-CM | POA: Diagnosis present

## 2024-01-16 DIAGNOSIS — E1151 Type 2 diabetes mellitus with diabetic peripheral angiopathy without gangrene: Secondary | ICD-10-CM | POA: Diagnosis present

## 2024-01-16 DIAGNOSIS — D631 Anemia in chronic kidney disease: Secondary | ICD-10-CM | POA: Diagnosis present

## 2024-01-16 DIAGNOSIS — Z992 Dependence on renal dialysis: Secondary | ICD-10-CM

## 2024-01-16 DIAGNOSIS — E8770 Fluid overload, unspecified: Secondary | ICD-10-CM | POA: Diagnosis present

## 2024-01-16 DIAGNOSIS — E1142 Type 2 diabetes mellitus with diabetic polyneuropathy: Secondary | ICD-10-CM | POA: Diagnosis present

## 2024-01-16 DIAGNOSIS — Z8673 Personal history of transient ischemic attack (TIA), and cerebral infarction without residual deficits: Secondary | ICD-10-CM

## 2024-01-16 DIAGNOSIS — Z87891 Personal history of nicotine dependence: Secondary | ICD-10-CM

## 2024-01-16 DIAGNOSIS — Z79899 Other long term (current) drug therapy: Secondary | ICD-10-CM

## 2024-01-16 DIAGNOSIS — J9601 Acute respiratory failure with hypoxia: Principal | ICD-10-CM | POA: Diagnosis present

## 2024-01-16 DIAGNOSIS — Z89422 Acquired absence of other left toe(s): Secondary | ICD-10-CM

## 2024-01-16 DIAGNOSIS — Z822 Family history of deafness and hearing loss: Secondary | ICD-10-CM

## 2024-01-16 DIAGNOSIS — I5033 Acute on chronic diastolic (congestive) heart failure: Secondary | ICD-10-CM | POA: Diagnosis present

## 2024-01-16 DIAGNOSIS — I272 Pulmonary hypertension, unspecified: Secondary | ICD-10-CM | POA: Diagnosis present

## 2024-01-16 DIAGNOSIS — I132 Hypertensive heart and chronic kidney disease with heart failure and with stage 5 chronic kidney disease, or end stage renal disease: Secondary | ICD-10-CM | POA: Diagnosis present

## 2024-01-16 DIAGNOSIS — E11319 Type 2 diabetes mellitus with unspecified diabetic retinopathy without macular edema: Secondary | ICD-10-CM | POA: Diagnosis present

## 2024-01-16 DIAGNOSIS — Z794 Long term (current) use of insulin: Secondary | ICD-10-CM

## 2024-01-16 LAB — COMPREHENSIVE METABOLIC PANEL WITH GFR
ALT: 39 U/L (ref 0–44)
AST: 46 U/L — ABNORMAL HIGH (ref 15–41)
Albumin: 3.9 g/dL (ref 3.5–5.0)
Alkaline Phosphatase: 576 U/L — ABNORMAL HIGH (ref 38–126)
Anion gap: 20 — ABNORMAL HIGH (ref 5–15)
BUN: 83 mg/dL — ABNORMAL HIGH (ref 8–23)
CO2: 24 mmol/L (ref 22–32)
Calcium: 10.5 mg/dL — ABNORMAL HIGH (ref 8.9–10.3)
Chloride: 89 mmol/L — ABNORMAL LOW (ref 98–111)
Creatinine, Ser: 13.9 mg/dL — ABNORMAL HIGH (ref 0.61–1.24)
GFR, Estimated: 4 mL/min — ABNORMAL LOW
Glucose, Bld: 163 mg/dL — ABNORMAL HIGH (ref 70–99)
Potassium: 6.1 mmol/L — ABNORMAL HIGH (ref 3.5–5.1)
Sodium: 132 mmol/L — ABNORMAL LOW (ref 135–145)
Total Bilirubin: 0.4 mg/dL (ref 0.0–1.2)
Total Protein: 7.1 g/dL (ref 6.5–8.1)

## 2024-01-16 LAB — CBC WITH DIFFERENTIAL/PLATELET
Abs Immature Granulocytes: 0.03 K/uL (ref 0.00–0.07)
Basophils Absolute: 0 K/uL (ref 0.0–0.1)
Basophils Relative: 0 %
Eosinophils Absolute: 0 K/uL (ref 0.0–0.5)
Eosinophils Relative: 0 %
HCT: 29.9 % — ABNORMAL LOW (ref 39.0–52.0)
Hemoglobin: 9.8 g/dL — ABNORMAL LOW (ref 13.0–17.0)
Immature Granulocytes: 1 %
Lymphocytes Relative: 9 %
Lymphs Abs: 0.4 K/uL — ABNORMAL LOW (ref 0.7–4.0)
MCH: 30.8 pg (ref 26.0–34.0)
MCHC: 32.8 g/dL (ref 30.0–36.0)
MCV: 94 fL (ref 80.0–100.0)
Monocytes Absolute: 0.4 K/uL (ref 0.1–1.0)
Monocytes Relative: 8 %
Neutro Abs: 3.9 K/uL (ref 1.7–7.7)
Neutrophils Relative %: 82 %
Platelets: 155 K/uL (ref 150–400)
RBC: 3.18 MIL/uL — ABNORMAL LOW (ref 4.22–5.81)
RDW: 14.4 % (ref 11.5–15.5)
WBC: 4.8 K/uL (ref 4.0–10.5)
nRBC: 0 % (ref 0.0–0.2)

## 2024-01-16 LAB — HEPATITIS B SURFACE ANTIGEN: Hepatitis B Surface Ag: NONREACTIVE

## 2024-01-16 LAB — CBG MONITORING, ED: Glucose-Capillary: 146 mg/dL — ABNORMAL HIGH (ref 70–99)

## 2024-01-16 LAB — I-STAT CG4 LACTIC ACID, ED: Lactic Acid, Venous: 0.6 mmol/L (ref 0.5–1.9)

## 2024-01-16 MED ORDER — PENTAFLUOROPROP-TETRAFLUOROETH EX AERO: 1.0000 | INHALATION_SPRAY | CUTANEOUS | Status: DC | PRN

## 2024-01-16 MED ORDER — IPRATROPIUM-ALBUTEROL 0.5-2.5 (3) MG/3ML IN SOLN
3.0000 mL | Freq: Once | RESPIRATORY_TRACT | Status: AC
  Administered 2024-01-16: 3 mL via RESPIRATORY_TRACT
  Filled 2024-01-16: qty 3

## 2024-01-16 MED ORDER — CLOPIDOGREL BISULFATE 75 MG PO TABS
75.0000 mg | ORAL_TABLET | Freq: Every day | ORAL | Status: DC
  Administered 2024-01-17 – 2024-01-18 (×2): 75 mg via ORAL
  Filled 2024-01-16 (×2): qty 1

## 2024-01-16 MED ORDER — INSULIN ASPART 100 UNIT/ML IJ SOLN
0.0000 [IU] | Freq: Three times a day (TID) | INTRAMUSCULAR | Status: DC
  Administered 2024-01-17: 2 [IU] via SUBCUTANEOUS
  Administered 2024-01-17: 1 [IU] via SUBCUTANEOUS
  Filled 2024-01-16: qty 2
  Filled 2024-01-16: qty 1

## 2024-01-16 MED ORDER — INSULIN GLARGINE 100 UNIT/ML ~~LOC~~ SOLN: 15.0000 [IU] | Freq: Every day | SUBCUTANEOUS | Status: DC

## 2024-01-16 MED ORDER — DEXTROSE 50 % IV SOLN
1.0000 | Freq: Once | INTRAVENOUS | Status: AC
  Administered 2024-01-16: 50 mL via INTRAVENOUS
  Filled 2024-01-16: qty 50

## 2024-01-16 MED ORDER — INSULIN ASPART 100 UNIT/ML IV SOLN
5.0000 [IU] | Freq: Once | INTRAVENOUS | Status: AC
  Administered 2024-01-16: 5 [IU] via INTRAVENOUS
  Filled 2024-01-16: qty 5

## 2024-01-16 MED ORDER — OSELTAMIVIR PHOSPHATE 30 MG PO CAPS: 30.0000 mg | ORAL_CAPSULE | ORAL | Status: DC | PRN

## 2024-01-16 MED ORDER — HEPARIN SODIUM (PORCINE) 1000 UNIT/ML DIALYSIS: 1000.0000 [IU] | INTRAMUSCULAR | Status: DC | PRN

## 2024-01-16 MED ORDER — OSELTAMIVIR PHOSPHATE 30 MG PO CAPS
30.0000 mg | ORAL_CAPSULE | Freq: Once | ORAL | Status: AC
  Administered 2024-01-16: 30 mg via ORAL
  Filled 2024-01-16: qty 1

## 2024-01-16 MED ORDER — LIDOCAINE HCL (PF) 1 % IJ SOLN: 5.0000 mL | INTRAMUSCULAR | Status: DC | PRN

## 2024-01-16 MED ORDER — INSULIN GLARGINE 100 UNIT/ML ~~LOC~~ SOLN
25.0000 [IU] | Freq: Every day | SUBCUTANEOUS | Status: DC
  Administered 2024-01-17 – 2024-01-18 (×2): 25 [IU] via SUBCUTANEOUS
  Filled 2024-01-16 (×2): qty 0.25

## 2024-01-16 MED ORDER — CHLORHEXIDINE GLUCONATE CLOTH 2 % EX PADS
6.0000 | MEDICATED_PAD | Freq: Every day | CUTANEOUS | Status: DC
  Administered 2024-01-17 – 2024-01-18 (×2): 6 via TOPICAL

## 2024-01-16 MED ORDER — IPRATROPIUM-ALBUTEROL 0.5-2.5 (3) MG/3ML IN SOLN: 3.0000 mL | Freq: Four times a day (QID) | RESPIRATORY_TRACT | Status: DC | PRN

## 2024-01-16 MED ORDER — ROSUVASTATIN CALCIUM 5 MG PO TABS
10.0000 mg | ORAL_TABLET | Freq: Every day | ORAL | Status: DC
  Administered 2024-01-17 – 2024-01-18 (×2): 10 mg via ORAL
  Filled 2024-01-16 (×2): qty 2

## 2024-01-16 MED ORDER — ALBUTEROL SULFATE (2.5 MG/3ML) 0.083% IN NEBU
10.0000 mg | INHALATION_SOLUTION | Freq: Once | RESPIRATORY_TRACT | Status: AC
  Administered 2024-01-16: 10 mg via RESPIRATORY_TRACT
  Filled 2024-01-16: qty 12

## 2024-01-16 MED ORDER — ASPIRIN 81 MG PO TBEC
81.0000 mg | DELAYED_RELEASE_TABLET | Freq: Every day | ORAL | Status: DC
  Administered 2024-01-17 – 2024-01-18 (×2): 81 mg via ORAL
  Filled 2024-01-16 (×2): qty 1

## 2024-01-16 MED ORDER — CALCIUM GLUCONATE-NACL 1-0.675 GM/50ML-% IV SOLN
1.0000 g | Freq: Once | INTRAVENOUS | Status: AC
  Administered 2024-01-16: 1000 mg via INTRAVENOUS
  Filled 2024-01-16: qty 50

## 2024-01-16 MED ORDER — GABAPENTIN 100 MG PO CAPS
100.0000 mg | ORAL_CAPSULE | Freq: Three times a day (TID) | ORAL | Status: DC
  Administered 2024-01-16 – 2024-01-18 (×5): 100 mg via ORAL
  Filled 2024-01-16 (×6): qty 1

## 2024-01-16 MED ORDER — HEPARIN SODIUM (PORCINE) 1000 UNIT/ML IJ SOLN
INTRAMUSCULAR | Status: AC
  Filled 2024-01-16: qty 2

## 2024-01-16 MED ORDER — ACETAMINOPHEN 325 MG PO TABS
650.0000 mg | ORAL_TABLET | Freq: Once | ORAL | Status: AC
  Administered 2024-01-16: 650 mg via ORAL
  Filled 2024-01-16: qty 2

## 2024-01-16 MED ORDER — HEPARIN SODIUM (PORCINE) 1000 UNIT/ML DIALYSIS: 2000.0000 [IU] | Freq: Once | INTRAMUSCULAR | Status: DC

## 2024-01-16 MED ORDER — LIDOCAINE-PRILOCAINE 2.5-2.5 % EX CREA: 1.0000 | TOPICAL_CREAM | CUTANEOUS | Status: DC | PRN

## 2024-01-16 MED ORDER — SODIUM BICARBONATE 8.4 % IV SOLN
50.0000 meq | Freq: Once | INTRAVENOUS | Status: AC
  Administered 2024-01-16: 50 meq via INTRAVENOUS
  Filled 2024-01-16: qty 50

## 2024-01-16 MED ORDER — PANTOPRAZOLE SODIUM 40 MG PO TBEC
40.0000 mg | DELAYED_RELEASE_TABLET | Freq: Every day | ORAL | Status: DC
  Administered 2024-01-17 – 2024-01-18 (×2): 40 mg via ORAL
  Filled 2024-01-16 (×2): qty 1

## 2024-01-16 MED ORDER — SODIUM ZIRCONIUM CYCLOSILICATE 10 G PO PACK
10.0000 g | PACK | Freq: Once | ORAL | Status: AC
  Administered 2024-01-16: 10 g via ORAL
  Filled 2024-01-16: qty 1

## 2024-01-16 NOTE — ED Notes (Signed)
 Patient transported to dialysis

## 2024-01-16 NOTE — ED Provider Notes (Signed)
" °  [place] [facility] Provider Note   .Critical Care  Performed by: Rogelia Jerilynn RAMAN, MD Authorized by: Rogelia Jerilynn RAMAN, MD   Critical care provider statement:    Critical care time (minutes):  31   Critical care was necessary to treat or prevent imminent or life-threatening deterioration of the following conditions:  Endocrine crisis (hyperkalemia)   Critical care was time spent personally by me on the following activities:  Development of treatment plan with patient or surrogate, discussions with consultants, evaluation of patient's response to treatment, examination of patient, ordering and review of laboratory studies, ordering and review of radiographic studies, ordering and performing treatments and interventions, pulse oximetry, re-evaluation of patient's condition and review of old charts     Rogelia Jerilynn RAMAN, MD 01/17/24 [phone]  "

## 2024-01-16 NOTE — ED Triage Notes (Signed)
 Pt arrives via EMS from home. Pt tested positive for the flu recently. He went to dialysis today was they told him to go to the ED. EMS states they placed him on [facility] due to spo2 of 88%. Pt does not wear oxygent at baseline. RA sat is 85%. Pt placed back on 2LNC. He is AxOx4.

## 2024-01-16 NOTE — Progress Notes (Addendum)
 Nephrology Quick Note  S: Aware that Edgar Brewer is being admitted with hypoxia and Flu A/B. Our team was consulted for dialysis needs. Usual MWF dialysis schedule - was supposed to be dialyzed today on holiday schedule but he was too weak to make it. Also, missed dialysis on Sunday due to being sick. Hypoxic on room air, but now stable with [place] O2.  Seen in ED bed - family in room. He is talking with them, comfortable on [place] O2.  O: Blood pressure (!) 115/51, pulse 88, temperature 100.1 F (37.8 C), temperature source Oral, resp. rate (!) 27, SpO2 100%. GEN: Well appearing, NAD. [place] O2 in place CV: RRR PULM: Wheezing throughout.  CXR with pulm edema Labs pending.  A/P:  ESRD: Updated plan -- K 6.1 -> will dialyze tonight - return to ED afterwards, may be able to discharge home after. If not, then will do full consult in AM.  Izetta Boehringer, PA-C [facility] Pager [phone]

## 2024-01-16 NOTE — Consult Note (Signed)
 " Initial consult   Edgar Brewer FMW:[id number] DOB: 1960/05/02 DOA: 01/16/2024  PCP: Katrinka Garnette KIDD, MD   Patient coming from: Home  Chief Complaint: Missed dialysis  HPI: Edgar Brewer is a 63 y.o. male with medical history significant of hypertension, diabetes, CVA, PAD, GERD, ESRD on HD, chronic diastolic CHF, obesity, anemia presenting after missed HD.  Patient missed his last dialysis session due to feeling unwell in the setting of recently being diagnosed with the flu.  Saw PCP yesterday and was positive for flu A and B.  Started on Tamiflu .  Missed his dialysis session yesterday due to feeling unwell and then was going to go today but was unable to make that either and was told to go elsewhere for HD.  Called EMS to take him to the ED.  Found to be hypoxic to 85% which improved on 2 L of normal oxygen.  Denies fevers, chills, chest pain, shortness breath, abdominal pain, constipation, diarrhea, nausea, vomiting.    ED Course: Vital signs in the ED notable for blood pressure in the 110s-120 systolic, respirate in the teens-20s, requiring 2 L maintain saturation.  CMP pending.  CBC with hemoglobin stable at 9.8.  Lactic acid normal.  Chest x-ray showed cardiomegaly with moderate vascular congestion.  Patient received Tylenol  and DuoNeb in the ED.  Discussed with EDP, they will consult nephrology to see if we can get patient dialyzed and sent home today.  If patient remains hypoxic after HD or they are unable to get him dialyzed today will admit overnight.  Review of Systems: As per HPI otherwise all other systems reviewed and are negative.  Past Medical History:  Diagnosis Date   Acute on chronic combined systolic and diastolic CHF, NYHA class 1 (HCC) 01/13/2015   Allergy    Arthritis    Chronic kidney disease    MWF [facility].   Diabetes mellitus type 2 with complications (HCC)    Previous insulin , non-insulin  requiring noted 09/2018.  Complications include retinopathy,  peripheral neuropathy.   GERD (gastroesophageal reflux disease)    at times   Head injury with loss of consciousness (HCC)    History of kidney stones    Hypertension    Neuromuscular disorder (HCC)    Neuropathy    Pulmonary hypertension (HCC)    PA peak pressure 41 mmHg 03/04/16 echo   Stroke [facility])    no residual weakness   Varicose veins of lower extremities with complications 12/24/2014    Past Surgical History:  Procedure Laterality Date   ABDOMINAL AORTOGRAM W/LOWER EXTREMITY N/A 12/01/2023   Procedure: ABDOMINAL AORTOGRAM W/LOWER EXTREMITY;  Surgeon: Magda Debby SAILOR, MD;  [facility]: [facility] INVASIVE CV LAB;  Service: Cardiovascular;  Laterality: N/A;   AMPUTATION TOE Left 09/21/2023   Procedure: AMPUTATION, TOE;  Surgeon: Malvin Marsa FALCON, DPM;  Location: [facility] OR;  Service: Orthopedics/Podiatry;  Laterality: Left;  Left 2nd toe amputation   AV FISTULA PLACEMENT Left 06/01/2016   Procedure: ARTERIOVENOUS (AV) FISTULA CREATION;  Surgeon: Oris Krystal FALCON, MD;  Location: [facility] OR;  Service: Vascular;  Laterality: Left;   AV FISTULA PLACEMENT Left 08/10/2017   Procedure: BRACHIOCEPHALIC ARTERIOVENOUS (AV) FISTULA CREATION LEFT UPPER EXTREMITY;  Surgeon: Eliza Lonni RAMAN, MD;  Location: [facility] OR;  Service: Vascular;  Laterality: Left;   CHOLECYSTECTOMY N/A 10/04/2018   Procedure: LAPAROSCOPIC CHOLECYSTECTOMY;  Surgeon: Stevie Herlene Righter, MD;  Location: [facility] OR;  Service: General;  Laterality: N/A;   COLONOSCOPY W/ POLYPECTOMY  03/2016   For evaluation of  iron deficiency anemia.  Dr. Shila.  10 polyps, largest 20 mm.  Pathology: tubular adenomas with no high-grade dysplasia, hyperplastic.  None bleeding internal hemorrhoids.  Pandiverticulosis.   CYSTOSCOPY WITH RETROGRADE PYELOGRAM, URETEROSCOPY AND STENT PLACEMENT Bilateral 02/25/2021   Procedure: CYSTOSCOPY WITH BILATERAL  RETROGRADE PYELOGRAM, RIGHT DIAGNOSTIC URETEROSCOPY,  BLADDER BIOPSY WITH FULGERATION;  Surgeon: Alvaro Hummer, MD;   Location: [facility] ORS;  Service: Urology;  Laterality: Bilateral;   ENDOVENOUS ABLATION SAPHENOUS VEIN W/ LASER Left 07-23-2015   endovenous laser ablation left greater saphenous vein by Krystal Doing MD   ENDOVENOUS ABLATION SAPHENOUS VEIN W/ LASER Right 10/15/2015   EVLA right greater saphenous vein by Krystal Doing MD   ESOPHAGOGASTRODUODENOSCOPY  03/2016   For evaluation of IDA.  Dr. Shila.  Duodenal erythema, pathology benign mucosa, no villous atrophy.   GALLBLADDER SURGERY  10/2018   HOLMIUM LASER APPLICATION Right 02/25/2021   Procedure: HOLMIUM LASER OF TUMOR;  Surgeon: Alvaro Hummer, MD;  Location: [facility] ORS;  Service: Urology;  Laterality: Right;   IR RADIOLOGIST EVAL & MGMT  11/29/2018   LOWER EXTREMITY ANGIOGRAPHY N/A 09/20/2023   Procedure: Lower Extremity Angiography;  Surgeon: Magda Debby SAILOR, MD;  Location: [facility] INVASIVE CV LAB;  Service: Cardiovascular;  Laterality: N/A;   LOWER EXTREMITY ANGIOGRAPHY N/A 12/01/2023   Procedure: Lower Extremity Angiography;  Surgeon: Magda Debby SAILOR, MD;  Location: [facility] INVASIVE CV LAB;  Service: Cardiovascular;  Laterality: N/A;   LOWER EXTREMITY INTERVENTION Left 09/20/2023   Procedure: LOWER EXTREMITY INTERVENTION;  Surgeon: Magda Debby SAILOR, MD;  Location: [facility] CV LAB;  Service: Cardiovascular;  Laterality: Left;   LOWER EXTREMITY INTERVENTION N/A 12/01/2023   Procedure: LOWER EXTREMITY INTERVENTION;  Surgeon: Magda Debby SAILOR, MD;  Location: [facility]VAS[facility] LAB;  Service: Cardiovascular;  Laterality: N/A;   TEE WITHOUT CARDIOVERSION N/A 11/19/2018   Procedure: TRANSESOPHAGEAL ECHOCARDIOGRAM (TEE);  Surgeon: Alveta Aleene PARAS, MD;  Location: [facility] ENDOSCOPY;  Service: Cardiovascular;  Laterality: N/A;   TRANSESOPHAGEAL ECHOCARDIOGRAM (CATH LAB) N/A 11/01/2023   Procedure: TRANSESOPHAGEAL ECHOCARDIOGRAM;  Surgeon: Floretta Mallard, MD;  Location: [facility] INVASIVE CV LAB;  Service: Cardiovascular;  Laterality: N/A;   TRANSMETATARSAL AMPUTATION Left 10/23/2023    Procedure: AMPUTATION, FOOT, TRANSMETATARSAL;  Surgeon: Malvin Marsa FALCON, DPM;  Location: [facility] OR;  Service: Orthopedics/Podiatry;  Laterality: Left;    Social History  reports that he quit smoking about 9 years ago. His smoking use included cigarettes. He started smoking about 54 years ago. He has a 45 pack-year smoking history. He has never used smokeless tobacco. He reports current alcohol use of about 1.0 standard drink of alcohol per week. He reports that he does not use drugs.  Allergies[1]  Family History  Problem Relation Age of Onset   Arthritis Mother    Hearing loss Father    Prostate cancer Father    Hyperlipidemia Maternal Grandmother    Heart disease Maternal Grandmother    Lung cancer Maternal Grandfather   Reviewed on admission  Prior to Admission medications  Medication Sig Start Date End Date Taking? Authorizing Provider  aspirin  EC 81 MG tablet Take 81 mg by mouth daily. Swallow whole.    [provider]  b complex-vitamin c -folic acid  (NEPHRO-VITE) 0.8 MG TABS tablet Take 1 tablet by mouth in the morning.    [provider]  BD PEN NEEDLE SHORT ULTRAFINE 31G X 8 MM MISC USE TO INJECT INSULIN  INTO THE SKIN DAILY 12/18/23   Katrinka Garnette KIDD, MD  cinacalcet  (SENSIPAR ) 60 MG  tablet Take 60 mg by mouth every Tuesday, Thursday, and Saturday at 6 PM. In the evening.    [provider]  clopidogrel  (PLAVIX ) 75 MG tablet Take 1 tablet (75 mg total) by mouth daily. 12/01/23 11/30/24  Magda Debby SAILOR, MD  diphenhydramine -acetaminophen  (TYLENOL  PM) 25-500 MG TABS tablet Take 1 tablet by mouth at bedtime as needed (Sleep/Pain).    [provider]  Docusate Calcium  (STOOL SOFTENER PO) Take 1-2 tablets by mouth 2 (two) times daily as needed (Constipation).    [provider]  gabapentin  (NEURONTIN ) 100 MG capsule TAKE 1 CAPSULE BY MOUTH THREE TIMES DAILY 11/13/23   Katrinka Garnette KIDD, MD  LANTUS  SOLOSTAR 100 UNIT/ML Solostar Pen Inject  50 Units into the skin daily. 10/06/23   [provider]  omeprazole  (PRILOSEC) 20 MG capsule Take 1 capsule by mouth once daily 11/13/23   Katrinka Garnette KIDD, MD  oseltamivir  (TAMIFLU ) 30 MG capsule Take 30mg  today, then take 30mg  3x/wk after dialysis x 5 doses 01/15/24   Jodie Lavern CROME, MD  rosuvastatin  (CRESTOR ) 10 MG tablet Take 1 tablet by mouth once daily 11/13/23   Katrinka Garnette KIDD, MD  sevelamer  carbonate (RENVELA ) 800 MG tablet Take 2 tablets (1,600 mg total) by mouth 3 (three) times daily with meals. 10/22/18   Cheryle Page, MD    Physical Exam: Vitals:   01/16/24 1302 01/16/24 1303 01/16/24 1400 01/16/24 1515  BP: (!) 127/54  (!) 119/53 (!) 115/51  Pulse: 88  88 88  Resp: (!) 23  (!) 29 (!) 27  Temp: 100.1 F (37.8 C)     TempSrc: Oral     SpO2: (!) 85% 94% 100% 100%    Physical Exam Constitutional:      General: He is not in acute distress.    Appearance: Normal appearance.  HENT:     Head: Normocephalic and atraumatic.     Mouth/Throat:     Mouth: Mucous membranes are moist.     Pharynx: Oropharynx is clear.  Eyes:     Extraocular Movements: Extraocular movements intact.     Pupils: Pupils are equal, round, and reactive to light.  Cardiovascular:     Rate and Rhythm: Normal rate and regular rhythm.     Pulses: Normal pulses.     Heart sounds: Normal heart sounds.  Pulmonary:     Effort: Pulmonary effort is normal. No respiratory distress.     Breath sounds: Normal breath sounds.  Abdominal:     General: Bowel sounds are normal. There is no distension.     Palpations: Abdomen is soft.     Tenderness: There is no abdominal tenderness.  Musculoskeletal:        General: No swelling or deformity.     Right lower leg: Edema present.     Left lower leg: Edema present.  Skin:    General: Skin is warm and dry.  Neurological:     General: No focal deficit present.     Mental Status: Mental status is at baseline.    Labs on Admission: I have personally  reviewed following labs and imaging studies  CBC: Recent Labs  Lab 01/16/24 1526  WBC 4.8  NEUTROABS 3.9  HGB 9.8*  HCT 29.9*  MCV 94.0  PLT 155    Basic Metabolic Panel: No results for input(s): NA, K, CL, CO2, GLUCOSE, BUN, CREATININE, CALCIUM , MG, PHOS in the last 168 hours.  GFR: CrCl cannot be calculated (Patient's most recent lab result is older  than the maximum 21 days allowed.).  Liver Function Tests: No results for input(s): AST, ALT, ALKPHOS, BILITOT, PROT, ALBUMIN  in the last 168 hours.  Urine analysis:    Component Value Date/Time   COLORURINE AMBER (A) 11/18/2018 0807   APPEARANCEUR CLEAR 11/18/2018 0807   LABSPEC 1.015 11/18/2018 0807   PHURINE 6.0 11/18/2018 0807   GLUCOSEU >=500 (A) 11/18/2018 0807   HGBUR NEGATIVE 11/18/2018 0807   BILIRUBINUR SMALL (A) 11/18/2018 0807   KETONESUR 5 (A) 11/18/2018 0807   PROTEINUR >=300 (A) 11/18/2018 0807   NITRITE NEGATIVE 11/18/2018 0807   LEUKOCYTESUR NEGATIVE 11/18/2018 0807    Radiological Exams on Admission: DG Chest 2 View Result Date: 01/16/2024 CLINICAL DATA:  Tested positive for the flu, presenting with decreased oxygen saturation. EXAM: CHEST - 2 VIEW COMPARISON:  November 01, 2023 FINDINGS: The cardiac silhouette is mildly enlarged and unchanged in size. There is moderate severity prominence of the central pulmonary vasculature. No acute infiltrate, pleural effusion or pneumothorax is identified. Multilevel degenerative changes are present throughout the thoracic spine. IMPRESSION: Cardiomegaly with moderate severity pulmonary vascular congestion. Electronically Signed   By: Suzen Dials M.D.   On: 01/16/2024 15:28   EKG: Independently reviewed.  Sinus rhythm at 88 bpm.  Nonspecific T wave changes.  Some baseline artifact in V1, V2.  Assessment/Plan Active Problems:   CKD (chronic kidney disease), stage IV (HCC)   Obesity   Essential hypertension   Anemia of chronic  kidney failure   ESRD on dialysis (HCC)   PAD (peripheral artery disease)   Chronic diastolic CHF (congestive heart failure) (HCC)   Type 2 diabetes mellitus with diabetic neuropathy, unspecified (HCC)   History of stroke   GERD (gastroesophageal reflux disease)   Acute respiratory failure with hypoxia Volume overload ESRD on HD Chronic diastolic CHF > Patient presenting after missing dialysis in the setting of feeling unwell after testing positive for the flu. > Found to be hypoxic by EMS requiring 2 L for oxygenation.  Chest x-ray with evidence of moderate vascular congestion in the setting of missed HD and volume overload. > History of diastolic CHF in chart however recent echocardiogram showed EF 60-65%, normal diastolic function, normal RV function.  Volume is managed with HD. > Suspect respiratory failure and volume overload secondary to missed HD.  EDP will reach out to nephrology to see if we get dialysis done today and discharge patient from the ED. > If unable to get HD done today or patient remains hypoxic or other indication to admit afterwards will observe overnight. - Continue to monitor in the ED, will plan for operation if patient remains hypoxic or develops other indication for admission - Appreciate nephrology recommendations assistance - HD per nephrology - Trend renal function and electrolytes - Continue supplemental oxygen, wean as tolerated - Continue with Tamiflu  for flu A/B - If remains in the hospital continue with as needed DuoNebs  Hypertension - Not currently on antihypertensives  Diabetes > 50 units long-acting insulin  at home - 25 units long-acting insulin  daily - SSI  History of CVA PAD status post stents - Continue Plavix , aspirin , rosuvastatin   GERD - Continue PPI  Obesity - Noted  Anemia  > Hemoglobin stable at 9.8 - Trend CBC  DVT prophylaxis: Heparin  Code Status:   Full Family Communication:  None on initial evaluation  Disposition  Plan:   Patient is from:  Home  Anticipated DC to:  Home  Anticipated DC date:  0 to 1 days  Anticipated  DC barriers: If unable to get HD done  Consults called:  Nephrology consulted in the ED Admission status:  Consult only for now, will plan for observation on telemetry if remains hypoxic/other indication for admission  Severity of Illness: The appropriate patient status for this patient is consulted in the ED versus OBSERVATION.   Currently observation status is judged to be reasonable and necessary in order to provide the required intensity of service to ensure the patient's safety. The patient's presenting symptoms, physical exam findings, and initial radiographic and laboratory data in the context of their medical condition is felt to place them at decreased risk for further clinical deterioration. Furthermore, it is anticipated that the patient will be medically stable for discharge from the hospital within 2 midnights of admission.   Symptoms may improve if able to get HD done today and then patient will be stable for discharge.   Marsa KATHEE Scurry MD [facility]  How to contact the [facility] Attending or Consulting provider 7A - 7P or covering provider during after hours 7P -7A, for this patient?   Check the care team in [facility] and look for a) attending/consulting [facility] provider listed and b) the [facility] team listed Log into [email or web address] and use 's universal password to access. If you do not have the password, please contact the hospital operator. Locate the [facility] provider you are looking for under [facility] and page to a number that you can be directly reached. If you still have difficulty reaching the provider, please page the [facility] (Director on Call) for the Hospitalists listed on amion for assistance.  01/16/2024, 3:53 PM       [1]  Allergies Allergen Reactions   Codeine Anaphylaxis, Hives, Swelling and Other (See Comments)    Swelling all over body and the  throat   Clindamycin /Lincomycin Nausea And Vomiting   "

## 2024-01-16 NOTE — ED Provider Notes (Signed)
 " [facility] Provider Note   CSN: [id number] Arrival date & time: 01/16/24  1244     Patient presents with: No chief complaint on file.   Edgar Brewer is a 63 y.o. male with past medical history of diastolic CHF, CKD on dialysis, diabetes, hypertension, who presents emergency department for evaluation of missed dialysis in the setting of the flu.  Patient saw his PCP yesterday, where he tested positive for both influenza A and influenza B.  He was given Tamiflu  and told to start taking that.  Patient was initially supposed to go to dialysis yesterday, but missed due to not feeling well.  Patient stated he called dialysis today, and told him that he was not feeling well.  Patient reports he was told to go somewhere else so he called EMS and ended up in the emergency department.  Patient was found to be hypoxic on 85% on room air.     HPI     Prior to Admission medications  Medication Sig Start Date End Date Taking? Authorizing Provider  aspirin  EC 81 MG tablet Take 81 mg by mouth daily. Swallow whole.    [provider]  b complex-vitamin c -folic acid  (NEPHRO-VITE) 0.8 MG TABS tablet Take 1 tablet by mouth in the morning.    [provider]  BD PEN NEEDLE SHORT ULTRAFINE 31G X 8 MM MISC USE TO INJECT INSULIN  INTO THE SKIN DAILY 12/18/23   Katrinka Garnette KIDD, MD  cinacalcet  (SENSIPAR ) 60 MG tablet Take 60 mg by mouth every Tuesday, Thursday, and Saturday at 6 PM. In the evening.    [provider]  clopidogrel  (PLAVIX ) 75 MG tablet Take 1 tablet (75 mg total) by mouth daily. 12/01/23 11/30/24  Magda Debby SAILOR, MD  diphenhydramine -acetaminophen  (TYLENOL  PM) 25-500 MG TABS tablet Take 1 tablet by mouth at bedtime as needed (Sleep/Pain).    [provider]  Docusate Calcium  (STOOL SOFTENER PO) Take 1-2 tablets by mouth 2 (two) times daily as needed (Constipation).    [provider]  gabapentin  (NEURONTIN )  100 MG capsule TAKE 1 CAPSULE BY MOUTH THREE TIMES DAILY 11/13/23   Katrinka Garnette KIDD, MD  LANTUS  SOLOSTAR 100 UNIT/ML Solostar Pen Inject 50 Units into the skin daily. 10/06/23   [provider]  omeprazole  (PRILOSEC) 20 MG capsule Take 1 capsule by mouth once daily 11/13/23   Katrinka Garnette KIDD, MD  oseltamivir  (TAMIFLU ) 30 MG capsule Take 30mg  today, then take 30mg  3x/wk after dialysis x 5 doses 01/15/24   Jodie Lavern CROME, MD  rosuvastatin  (CRESTOR ) 10 MG tablet Take 1 tablet by mouth once daily 11/13/23   Katrinka Garnette KIDD, MD  sevelamer  carbonate (RENVELA ) 800 MG tablet Take 2 tablets (1,600 mg total) by mouth 3 (three) times daily with meals. 10/22/18   Cheryle Page, MD    Allergies: Codeine and Clindamycin /lincomycin    Review of Systems  Respiratory:  Positive for shortness of breath and wheezing.     Updated Vital Signs BP (!) 119/53   Pulse 88   Temp 100.1 F (37.8 C) (Oral)   Resp (!) 29   SpO2 100%   Physical Exam Vitals and nursing note reviewed.  Constitutional:      Appearance: Normal appearance.  HENT:     Head: Normocephalic and atraumatic.     Mouth/Throat:     Mouth: Mucous membranes are moist.  Eyes:     General: No scleral icterus.  Right eye: No discharge.        Left eye: No discharge.     Conjunctiva/sclera: Conjunctivae normal.  Cardiovascular:     Rate and Rhythm: Normal rate and regular rhythm.     Pulses: Normal pulses.  Pulmonary:     Effort: Accessory muscle usage present.     Breath sounds: Wheezing present.  Abdominal:     General: There is no distension.     Tenderness: There is no abdominal tenderness.  Musculoskeletal:        General: No deformity.     Cervical back: Normal range of motion.  Skin:    General: Skin is warm and dry.     Capillary Refill: Capillary refill takes less than 2 seconds.  Neurological:     Mental Status: He is alert.     Motor: No weakness.  Psychiatric:        Mood and Affect: Mood normal.      (all labs ordered are listed, but only abnormal results are displayed) Labs Reviewed  COMPREHENSIVE METABOLIC PANEL WITH GFR  CBC WITH DIFFERENTIAL/PLATELET  I-STAT CG4 LACTIC ACID, ED  I-STAT CG4 LACTIC ACID, ED    EKG: None  Radiology: No results found.  .Critical Care  Performed by: Torrence Marry RAMAN, PA-C Authorized by: Torrence Marry RAMAN, PA-C   Critical care provider statement:    Critical care time (minutes):  30   Critical care was necessary to treat or prevent imminent or life-threatening deterioration of the following conditions:  Respiratory failure   Critical care was time spent personally by me on the following activities:  Development of treatment plan with patient or surrogate, discussions with consultants, evaluation of patient's response to treatment, examination of patient, ordering and review of laboratory studies, ordering and review of radiographic studies, ordering and performing treatments and interventions, pulse oximetry, re-evaluation of patient's condition, review of old charts and blood draw for specimens   I assumed direction of critical care for this patient from another provider in my specialty: no     Care discussed with: admitting provider      Medications Ordered in the ED  ipratropium-albuterol  (DUONEB) 0.5-2.5 (3) MG/3ML nebulizer solution 3 mL (3 mLs Nebulization Given 01/16/24 1513)  acetaminophen  (TYLENOL ) tablet 650 mg (650 mg Oral Given 01/16/24 1513)    Clinical Course as of 01/16/24 1516  Tue Jan 16, 2024  1507 -Admit -Acute hypoxic respiratory failure 2/2 flu and missed dialysis -Dialysis MWF, missed yesterday and today [SE]    Clinical Course User Index [SE] Guillermina Hamilton, MD                                Medical Decision Making Amount and/or Complexity of Data Reviewed Labs: ordered. Radiology: ordered.  Risk OTC drugs. Prescription drug management. Decision regarding hospitalization.   This patient  presents to the ED for concern of shortness of breath, missed dialysis, this involves an extensive number of treatment options, and is a complaint that carries with it a high risk of complications and morbidity.  Differential diagnosis includes: Superimposed pneumonia secondary to flu, respiratory failure, electrolyte abnormality, missed dialysis  Co morbidities:  ESRD on dialysis, hypertension  Additional history: Patient saw his PCP yesterday and tested positive for influenza A and influenza B  Lab Tests:  Lab tests are pending at this time  Imaging Studies:  I ordered imaging studies including chest x-ray Final read currently this  time.  Cardiac Monitoring/ECG:  The patient was maintained on a cardiac monitor.  I personally viewed and interpreted the cardiac monitored which showed an underlying rhythm of: Sinus  Medicines ordered and prescription drug management:  I ordered medication including  Medications  ipratropium-albuterol  (DUONEB) 0.5-2.5 (3) MG/3ML nebulizer solution 3 mL (3 mLs Nebulization Given 01/16/24 1513)  acetaminophen  (TYLENOL ) tablet 650 mg (650 mg Oral Given 01/16/24 1513)   for shortness of breath or wheezing Reevaluation of the patient after these medicines showed that the patient improved I have reviewed the patients home medicines and have made adjustments as needed  Test Considered:   none  Critical Interventions:   none  Consultations Obtained: Hospitalist  Problem List / ED Course:     ICD-10-CM   1. Acute hypoxic respiratory failure (HCC)  J96.01     2. Influenza A  J10.1     3. Influenza B  J10.1     4. Dialysis patient  Z16.2       MDM: 63 year old male who presents emergency department for evaluation of missed dialysis in the setting of the flu.  Patient typically gets his dialysis on Monday, Wednesday, Friday, however he missed his dialysis yesterday due to the flu.  Patient was seen at his PCP where he tested positive for  flu A and flu B.  However, today he reports the emergency department via EMS for shortness of breath.  He was found to be hypoxic at 85% on room air.  His hypoxia improved with 3 L nasal cannula.  Patient did not receive dialysis today either, as he is sick with the flu.  Patient is using accessory abdominal muscles to move air and there is notable wheezing on his physical exam.  Chest x-ray ordered.  Basic labs ordered.  Will plan to admit this patient to medicine for acute hypoxic respiratory failure.  Based on the above, I do deem this patient to be critically ill.  Consult to triad hospitalist has been placed.   Dispostion:  After consideration of the diagnostic results and the patients response to treatment, I feel that the patient would benefit from hospital admission and dialysis.    Final diagnoses:  Acute hypoxic respiratory failure [facility])  Influenza A  Influenza B  Dialysis patient    ED Discharge Orders     None          Torrence Marry GORMAN DEVONNA 01/16/24 1516    Levander Houston, MD 01/17/24 1459  "

## 2024-01-16 NOTE — ED Provider Notes (Signed)
 Assume Care - Medical Decision Making  Care of patient assumed from previous emergency medicine provider at 1500. See their note for further details of history, physical exam and plan.  Briefly, Edgar Brewer is a 63 y.o. male who presents with: hypoxic respiratory failure presumably secondary to influenza and volume overload in the setting missed dialysis  Clinical Course as of 01/16/24 1540  Tue Jan 16, 2024  1507 -Admit -Acute hypoxic respiratory failure 2/2 flu and missed dialysis -Dialysis MWF, missed yesterday and today [SE]    Clinical Course User Index [SE] Guillermina Hamilton, MD     Reassessment: I personally reassessed the patient:  Vital Signs:  The most current vitals were  Vitals:   01/16/24 1302 01/16/24 1303 01/16/24 1400 01/16/24 1515  BP: (!) 127/54  (!) 119/53 (!) 115/51  Pulse: 88  88 88  Resp: (!) 23  (!) 29 (!) 27  Temp: 100.1 F (37.8 C)     TempSrc: Oral     SpO2: (!) 85% 94% 100% 100%   Hemodynamics:  The patient is hemodynamically stable. Mental Status:  The patient is alert  Additional MDM/ED Course: Patient hemodynamically stable after handoff.  Still tachypneic on 2 L.  Spoke with the hospitalist who is planning for admission.  We both feel that it is reasonable to try to dialyze the patient to see if his respiratory status improves.  If improved after dialysis patient could be discharged.  Will reach out to nephrology.  Nephrology plan to dialyze the patient down here in the ED.  Spoke with internal medicine who agrees it is reasonable to see if hypoxic respiratory failure improves after  dialysis.  Will reassess need for admission after dialysis.  Patient taken to dialysis from the ED.   Clinical Impression:  1. Acute hypoxic respiratory failure (HCC)   2. Influenza A   3. Influenza B   4. Dialysis patient     The plan for this patient was discussed with Dr. Rogelia, who voiced agreement and who oversaw evaluation and treatment of this patient.    Electronically signed by:   Hamilton Carlin Guillermina, M.D. PGY-2, Emergency Medicine      Guillermina Hamilton, MD 01/16/24 [id number]    Rogelia Jerilynn RAMAN, MD 01/21/24 [phone]

## 2024-01-17 ENCOUNTER — Other Ambulatory Visit: Payer: Self-pay

## 2024-01-17 DIAGNOSIS — D631 Anemia in chronic kidney disease: Secondary | ICD-10-CM | POA: Diagnosis present

## 2024-01-17 DIAGNOSIS — J9601 Acute respiratory failure with hypoxia: Secondary | ICD-10-CM | POA: Diagnosis present

## 2024-01-17 DIAGNOSIS — E871 Hypo-osmolality and hyponatremia: Secondary | ICD-10-CM | POA: Diagnosis present

## 2024-01-17 DIAGNOSIS — E11319 Type 2 diabetes mellitus with unspecified diabetic retinopathy without macular edema: Secondary | ICD-10-CM | POA: Diagnosis present

## 2024-01-17 DIAGNOSIS — Z8249 Family history of ischemic heart disease and other diseases of the circulatory system: Secondary | ICD-10-CM | POA: Diagnosis not present

## 2024-01-17 DIAGNOSIS — E8770 Fluid overload, unspecified: Secondary | ICD-10-CM | POA: Diagnosis present

## 2024-01-17 DIAGNOSIS — N186 End stage renal disease: Secondary | ICD-10-CM | POA: Diagnosis present

## 2024-01-17 DIAGNOSIS — Z7902 Long term (current) use of antithrombotics/antiplatelets: Secondary | ICD-10-CM | POA: Diagnosis not present

## 2024-01-17 DIAGNOSIS — J101 Influenza due to other identified influenza virus with other respiratory manifestations: Secondary | ICD-10-CM | POA: Diagnosis present

## 2024-01-17 DIAGNOSIS — I272 Pulmonary hypertension, unspecified: Secondary | ICD-10-CM | POA: Diagnosis present

## 2024-01-17 DIAGNOSIS — I5033 Acute on chronic diastolic (congestive) heart failure: Secondary | ICD-10-CM | POA: Diagnosis present

## 2024-01-17 DIAGNOSIS — Z7982 Long term (current) use of aspirin: Secondary | ICD-10-CM | POA: Diagnosis not present

## 2024-01-17 DIAGNOSIS — Z6830 Body mass index (BMI) 30.0-30.9, adult: Secondary | ICD-10-CM | POA: Diagnosis not present

## 2024-01-17 DIAGNOSIS — Z992 Dependence on renal dialysis: Secondary | ICD-10-CM | POA: Diagnosis not present

## 2024-01-17 DIAGNOSIS — E1142 Type 2 diabetes mellitus with diabetic polyneuropathy: Secondary | ICD-10-CM | POA: Diagnosis present

## 2024-01-17 DIAGNOSIS — Z794 Long term (current) use of insulin: Secondary | ICD-10-CM | POA: Diagnosis not present

## 2024-01-17 DIAGNOSIS — E1122 Type 2 diabetes mellitus with diabetic chronic kidney disease: Secondary | ICD-10-CM | POA: Diagnosis present

## 2024-01-17 DIAGNOSIS — I2510 Atherosclerotic heart disease of native coronary artery without angina pectoris: Secondary | ICD-10-CM | POA: Diagnosis present

## 2024-01-17 DIAGNOSIS — E1151 Type 2 diabetes mellitus with diabetic peripheral angiopathy without gangrene: Secondary | ICD-10-CM | POA: Diagnosis present

## 2024-01-17 DIAGNOSIS — K219 Gastro-esophageal reflux disease without esophagitis: Secondary | ICD-10-CM | POA: Diagnosis present

## 2024-01-17 DIAGNOSIS — I132 Hypertensive heart and chronic kidney disease with heart failure and with stage 5 chronic kidney disease, or end stage renal disease: Secondary | ICD-10-CM | POA: Diagnosis present

## 2024-01-17 DIAGNOSIS — N2581 Secondary hyperparathyroidism of renal origin: Secondary | ICD-10-CM | POA: Diagnosis present

## 2024-01-17 LAB — COMPREHENSIVE METABOLIC PANEL WITH GFR
ALT: 40 U/L (ref 0–44)
AST: 47 U/L — ABNORMAL HIGH (ref 15–41)
Albumin: 3.8 g/dL (ref 3.5–5.0)
Alkaline Phosphatase: 602 U/L — ABNORMAL HIGH (ref 38–126)
Anion gap: 17 — ABNORMAL HIGH (ref 5–15)
BUN: 42 mg/dL — ABNORMAL HIGH (ref 8–23)
CO2: 27 mmol/L (ref 22–32)
Calcium: 10.5 mg/dL — ABNORMAL HIGH (ref 8.9–10.3)
Chloride: 89 mmol/L — ABNORMAL LOW (ref 98–111)
Creatinine, Ser: 8.42 mg/dL — ABNORMAL HIGH (ref 0.61–1.24)
GFR, Estimated: 7 mL/min — ABNORMAL LOW
Glucose, Bld: 141 mg/dL — ABNORMAL HIGH (ref 70–99)
Potassium: 4.5 mmol/L (ref 3.5–5.1)
Sodium: 132 mmol/L — ABNORMAL LOW (ref 135–145)
Total Bilirubin: 0.4 mg/dL (ref 0.0–1.2)
Total Protein: 7.1 g/dL (ref 6.5–8.1)

## 2024-01-17 LAB — BLOOD GAS, ARTERIAL
Acid-Base Excess: 5.8 mmol/L — ABNORMAL HIGH (ref 0.0–2.0)
Bicarbonate: 31.2 mmol/L — ABNORMAL HIGH (ref 20.0–28.0)
O2 Saturation: 98.6 %
Patient temperature: 37
pCO2 arterial: 47 mmHg (ref 32–48)
pH, Arterial: 7.43 (ref 7.35–7.45)
pO2, Arterial: 78 mmHg — ABNORMAL LOW (ref 83–108)

## 2024-01-17 LAB — T4, FREE: Free T4: 1.42 ng/dL (ref 0.80–2.00)

## 2024-01-17 LAB — CBC
HCT: 29.9 % — ABNORMAL LOW (ref 39.0–52.0)
Hemoglobin: 9.7 g/dL — ABNORMAL LOW (ref 13.0–17.0)
MCH: 30 pg (ref 26.0–34.0)
MCHC: 32.4 g/dL (ref 30.0–36.0)
MCV: 92.6 fL (ref 80.0–100.0)
Platelets: 158 K/uL (ref 150–400)
RBC: 3.23 MIL/uL — ABNORMAL LOW (ref 4.22–5.81)
RDW: 14.5 % (ref 11.5–15.5)
WBC: 4.9 K/uL (ref 4.0–10.5)
nRBC: 0 % (ref 0.0–0.2)

## 2024-01-17 LAB — VITAMIN B12: Vitamin B-12: 554 pg/mL (ref 180–914)

## 2024-01-17 LAB — TSH: TSH: 0.986 u[IU]/mL (ref 0.350–4.500)

## 2024-01-17 LAB — GLUCOSE, CAPILLARY
Glucose-Capillary: 93 mg/dL (ref 70–99)
Glucose-Capillary: 122 mg/dL — ABNORMAL HIGH (ref 70–99)
Glucose-Capillary: 160 mg/dL — ABNORMAL HIGH (ref 70–99)
Glucose-Capillary: 237 mg/dL — ABNORMAL HIGH (ref 70–99)

## 2024-01-17 MED ORDER — SEVELAMER CARBONATE 800 MG PO TABS
1600.0000 mg | ORAL_TABLET | Freq: Three times a day (TID) | ORAL | Status: DC
  Administered 2024-01-18 (×3): 1600 mg via ORAL
  Filled 2024-01-17 (×3): qty 2

## 2024-01-17 MED ORDER — HEPARIN SODIUM (PORCINE) 5000 UNIT/ML IJ SOLN
5000.0000 [IU] | Freq: Three times a day (TID) | INTRAMUSCULAR | Status: DC
  Administered 2024-01-17 – 2024-01-18 (×4): 5000 [IU] via SUBCUTANEOUS
  Filled 2024-01-17 (×4): qty 1

## 2024-01-17 MED ORDER — ALBUMIN HUMAN 25 % IV SOLN
INTRAVENOUS | Status: AC
  Filled 2024-01-17: qty 100

## 2024-01-17 MED ORDER — CINACALCET HCL 30 MG PO TABS
30.0000 mg | ORAL_TABLET | ORAL | Status: DC
  Administered 2024-01-17: 30 mg via ORAL
  Filled 2024-01-17: qty 1

## 2024-01-17 MED ORDER — HEPARIN SODIUM (PORCINE) 1000 UNIT/ML DIALYSIS: 1000.0000 [IU] | INTRAMUSCULAR | Status: DC | PRN

## 2024-01-17 MED ORDER — PENTAFLUOROPROP-TETRAFLUOROETH EX AERO: 1.0000 | INHALATION_SPRAY | CUTANEOUS | Status: DC | PRN

## 2024-01-17 MED ORDER — ALTEPLASE 2 MG IJ SOLR: 2.0000 mg | Freq: Once | INTRAMUSCULAR | Status: DC | PRN

## 2024-01-17 MED ORDER — ANTICOAGULANT SODIUM CITRATE 4% (200MG/5ML) IV SOLN
5.0000 mL | Status: DC | PRN
  Filled 2024-01-17: qty 5

## 2024-01-17 MED ORDER — OSELTAMIVIR PHOSPHATE 30 MG PO CAPS: 30.0000 mg | ORAL_CAPSULE | Freq: Every day | ORAL | Status: DC

## 2024-01-17 MED ORDER — HEPARIN SODIUM (PORCINE) 1000 UNIT/ML DIALYSIS: 20.0000 [IU]/kg | INTRAMUSCULAR | Status: DC | PRN

## 2024-01-17 MED ORDER — ACETAMINOPHEN 325 MG PO TABS: 650.0000 mg | ORAL_TABLET | Freq: Four times a day (QID) | ORAL | Status: DC | PRN

## 2024-01-17 MED ORDER — LIDOCAINE HCL (PF) 1 % IJ SOLN: 5.0000 mL | INTRAMUSCULAR | Status: DC | PRN

## 2024-01-17 MED ORDER — SODIUM CHLORIDE 0.9% FLUSH
3.0000 mL | Freq: Two times a day (BID) | INTRAVENOUS | Status: DC
  Administered 2024-01-17 – 2024-01-18 (×3): 3 mL via INTRAVENOUS

## 2024-01-17 MED ORDER — LORAZEPAM 0.5 MG PO TABS
0.5000 mg | ORAL_TABLET | ORAL | Status: DC | PRN
  Administered 2024-01-17: 0.5 mg via ORAL
  Filled 2024-01-17: qty 1

## 2024-01-17 MED ORDER — CHLORHEXIDINE GLUCONATE CLOTH 2 % EX PADS: 6.0000 | MEDICATED_PAD | Freq: Every day | CUTANEOUS | Status: DC

## 2024-01-17 MED ORDER — ALBUMIN HUMAN 25 % IV SOLN
25.0000 g | Freq: Once | INTRAVENOUS | Status: AC
  Administered 2024-01-17: 25 g via INTRAVENOUS

## 2024-01-17 MED ORDER — ACETAMINOPHEN 650 MG RE SUPP: 650.0000 mg | Freq: Four times a day (QID) | RECTAL | Status: DC | PRN

## 2024-01-17 MED ORDER — ALBUMIN HUMAN 25 % IV SOLN: 12.5000 g | Freq: Once | INTRAVENOUS | Status: DC

## 2024-01-17 MED ORDER — LIDOCAINE-PRILOCAINE 2.5-2.5 % EX CREA: 1.0000 | TOPICAL_CREAM | CUTANEOUS | Status: DC | PRN

## 2024-01-17 MED ORDER — HEPARIN SODIUM (PORCINE) 1000 UNIT/ML IJ SOLN
INTRAMUSCULAR | Status: AC
  Filled 2024-01-17: qty 5

## 2024-01-17 NOTE — Consult Note (Addendum)
 [facility] Nephrology Consult Note: Reason for Consult: To manage dialysis and dialysis related needs Referring Physician: Dr. Tobie  HPI:  Edgar Brewer is an 63 y.o. male with past medical history significant for hypertension, diabetes, CVA, PAD, CHF, anemia, ESRD on HD admitted with hypoxia and shortness of breath, seen as a consultation for the management of ESRD. The patient was recently diagnosed with positive flu A and B, started on Tamiflu .  He called EMS because of not feeling well and shortness of breath.  He was found to be hypoxic to 85% requiring oxygen supplementation.  Chest x-ray showed cardiomegaly with moderate stability of pulmonary vascular congestion.  The labs showed potassium level of 6.1 with hyponatremia.  He received dialysis overnight with 2.8 L UF.  Not much improvement in his breathing.  Requiring around 4 to 5 L of oxygen. He missed outpatient dialysis on Friday and Sunday. OP HD order: Dialyzes at [facility], MWF, 4hr, 2k 2.5 ca, EDW 91.4kg. LUE AVF,   Past Medical History:  Diagnosis Date   Acute on chronic combined systolic and diastolic CHF, NYHA class 1 (HCC) 01/13/2015   Allergy    Arthritis    Chronic kidney disease    MWF [facility].   Diabetes mellitus type 2 with complications (HCC)    Previous insulin , non-insulin  requiring noted 09/2018.  Complications include retinopathy, peripheral neuropathy.   GERD (gastroesophageal reflux disease)    at times   Head injury with loss of consciousness (HCC)    History of kidney stones    Hypertension    Neuromuscular disorder (HCC)    Neuropathy    Pulmonary hypertension (HCC)    PA peak pressure 41 mmHg 03/04/16 echo   Stroke [facility])    no residual weakness   Varicose veins of lower extremities with complications 12/24/2014    Past Surgical History:  Procedure Laterality Date   ABDOMINAL AORTOGRAM W/LOWER EXTREMITY N/A 12/01/2023   Procedure: ABDOMINAL AORTOGRAM W/LOWER EXTREMITY;  Surgeon: Magda Debby SAILOR, MD;  Location: [facility] INVASIVE CV LAB;  Service: Cardiovascular;  Laterality: N/A;   AMPUTATION TOE Left 09/21/2023   Procedure: AMPUTATION, TOE;  Surgeon: Malvin Marsa FALCON, DPM;  Location: [facility] OR;  Service: Orthopedics/Podiatry;  Laterality: Left;  Left 2nd toe amputation   AV FISTULA PLACEMENT Left 06/01/2016   Procedure: ARTERIOVENOUS (AV) FISTULA CREATION;  Surgeon: Oris Krystal FALCON, MD;  Location: [facility] OR;  Service: Vascular;  Laterality: Left;   AV FISTULA PLACEMENT Left 08/10/2017   Procedure: BRACHIOCEPHALIC ARTERIOVENOUS (AV) FISTULA CREATION LEFT UPPER EXTREMITY;  Surgeon: Eliza Lonni RAMAN, MD;  Location: [facility] OR;  Service: Vascular;  Laterality: Left;   CHOLECYSTECTOMY N/A 10/04/2018   Procedure: LAPAROSCOPIC CHOLECYSTECTOMY;  Surgeon: Stevie Herlene Righter, MD;  Location: [facility] OR;  Service: General;  Laterality: N/A;   COLONOSCOPY W/ POLYPECTOMY  03/2016   For evaluation of iron deficiency anemia.  Dr. Shila.  10 polyps, largest 20 mm.  Pathology: tubular adenomas with no high-grade dysplasia, hyperplastic.  None bleeding internal hemorrhoids.  Pandiverticulosis.   CYSTOSCOPY WITH RETROGRADE PYELOGRAM, URETEROSCOPY AND STENT PLACEMENT Bilateral 02/25/2021   Procedure: CYSTOSCOPY WITH BILATERAL  RETROGRADE PYELOGRAM, RIGHT DIAGNOSTIC URETEROSCOPY,  BLADDER BIOPSY WITH FULGERATION;  Surgeon: Alvaro Hummer, MD;  Location: [facility] ORS;  Service: Urology;  Laterality: Bilateral;   ENDOVENOUS ABLATION SAPHENOUS VEIN W/ LASER Left 07-23-2015   endovenous laser ablation left greater saphenous vein by Krystal Oris MD   ENDOVENOUS ABLATION SAPHENOUS VEIN W/ LASER Right 10/15/2015   EVLA right  greater saphenous vein by Krystal Doing MD   ESOPHAGOGASTRODUODENOSCOPY  03/2016   For evaluation of IDA.  Dr. Shila.  Duodenal erythema, pathology benign mucosa, no villous atrophy.   GALLBLADDER SURGERY  10/2018   HOLMIUM LASER APPLICATION Right 02/25/2021   Procedure: HOLMIUM LASER OF TUMOR;  Surgeon:  Alvaro Hummer, MD;  Location: [facility] ORS;  Service: Urology;  Laterality: Right;   IR RADIOLOGIST EVAL & MGMT  11/29/2018   LOWER EXTREMITY ANGIOGRAPHY N/A 09/20/2023   Procedure: Lower Extremity Angiography;  Surgeon: Magda Debby SAILOR, MD;  Location: [facility] INVASIVE CV LAB;  Service: Cardiovascular;  Laterality: N/A;   LOWER EXTREMITY ANGIOGRAPHY N/A 12/01/2023   Procedure: Lower Extremity Angiography;  Surgeon: Magda Debby SAILOR, MD;  Location: [facility] INVASIVE CV LAB;  Service: Cardiovascular;  Laterality: N/A;   LOWER EXTREMITY INTERVENTION Left 09/20/2023   Procedure: LOWER EXTREMITY INTERVENTION;  Surgeon: Magda Debby SAILOR, MD;  Location: [facility] INVASIVE CV LAB;  Service: Cardiovascular;  Laterality: Left;   LOWER EXTREMITY INTERVENTION N/A 12/01/2023   Procedure: LOWER EXTREMITY INTERVENTION;  Surgeon: Magda Debby SAILOR, MD;  Location: [facility] INVASIVE CV LAB;  Service: Cardiovascular;  Laterality: N/A;   TEE WITHOUT CARDIOVERSION N/A 11/19/2018   Procedure: TRANSESOPHAGEAL ECHOCARDIOGRAM (TEE);  Surgeon: Alveta Aleene PARAS, MD;  Location: [facility] ENDOSCOPY;  Service: Cardiovascular;  Laterality: N/A;   TRANSESOPHAGEAL ECHOCARDIOGRAM (CATH LAB) N/A 11/01/2023   Procedure: TRANSESOPHAGEAL ECHOCARDIOGRAM;  Surgeon: Floretta Mallard, MD;  Location: [facility] INVASIVE CV LAB;  Service: Cardiovascular;  Laterality: N/A;   TRANSMETATARSAL AMPUTATION Left 10/23/2023   Procedure: AMPUTATION, FOOT, TRANSMETATARSAL;  Surgeon: Malvin Marsa FALCON, DPM;  Location: [facility] OR;  Service: Orthopedics/Podiatry;  Laterality: Left;    Family History  Problem Relation Age of Onset   Arthritis Mother    Hearing loss Father    Prostate cancer Father    Hyperlipidemia Maternal Grandmother    Heart disease Maternal Grandmother    Lung cancer Maternal Grandfather     Social History:  reports that he quit smoking about 9 years ago. His smoking use included cigarettes. He started smoking about 54 years ago. He has a 45 pack-year smoking history. He  has never used smokeless tobacco. He reports current alcohol use of about 1.0 standard drink of alcohol per week. He reports that he does not use drugs.  Allergies: Allergies[1]  Medications: I have reviewed the patient's current medications.   Results for orders placed or performed during the hospital encounter of 01/16/24 (from the past 48 hours)  I-Stat Lactic Acid, ED     Status: None   Collection Time: 01/16/24  3:08 PM  Result Value Ref Range   Lactic Acid, Venous 0.6 0.5 - 1.9 mmol/L  Comprehensive metabolic panel     Status: Abnormal   Collection Time: 01/16/24  3:26 PM  Result Value Ref Range   Sodium 132 (L) 135 - 145 mmol/L   Potassium 6.1 (H) 3.5 - 5.1 mmol/L   Chloride 89 (L) 98 - 111 mmol/L   CO2 24 22 - 32 mmol/L   Glucose, Bld 163 (H) 70 - 99 mg/dL    Comment: Glucose reference range applies only to samples taken after fasting for at least 8 hours.   BUN 83 (H) 8 - 23 mg/dL   Creatinine, Ser 86.09 (H) 0.61 - 1.24 mg/dL   Calcium  10.5 (H) 8.9 - 10.3 mg/dL   Total Protein 7.1 6.5 - 8.1 g/dL   Albumin  3.9 3.5 - 5.0 g/dL   AST 46 (H) 15 -  41 U/L   ALT 39 0 - 44 U/L   Alkaline Phosphatase 576 (H) 38 - 126 U/L   Total Bilirubin 0.4 0.0 - 1.2 mg/dL   GFR, Estimated 4 (L) >60 mL/min    Comment: (NOTE) Calculated using the CKD-EPI Creatinine Equation (2021)    Anion gap 20 (H) 5 - 15    Comment: Performed at [facility], [address], [place], [place] [address]  CBC with Differential     Status: Abnormal   Collection Time: 01/16/24  3:26 PM  Result Value Ref Range   WBC 4.8 4.0 - 10.5 K/uL   RBC 3.18 (L) 4.22 - 5.81 MIL/uL   Hemoglobin 9.8 (L) 13.0 - 17.0 g/dL   HCT 70.0 (L) 60.9 - 47.9 %   MCV 94.0 80.0 - 100.0 fL   MCH 30.8 26.0 - 34.0 pg   MCHC 32.8 30.0 - 36.0 g/dL   RDW 85.5 88.4 - 84.4 %   Platelets 155 150 - 400 K/uL   nRBC 0.0 0.0 - 0.2 %   Neutrophils Relative % 82 %   Neutro Abs 3.9 1.7 - 7.7 K/uL   Lymphocytes Relative 9 %   Lymphs Abs  0.4 (L) 0.7 - 4.0 K/uL   Monocytes Relative 8 %   Monocytes Absolute 0.4 0.1 - 1.0 K/uL   Eosinophils Relative 0 %   Eosinophils Absolute 0.0 0.0 - 0.5 K/uL   Basophils Relative 0 %   Basophils Absolute 0.0 0.0 - 0.1 K/uL   Immature Granulocytes 1 %   Abs Immature Granulocytes 0.03 0.00 - 0.07 K/uL    Comment: Performed at [facility], [address] [place], [place] [address]  CBG monitoring, ED     Status: Abnormal   Collection Time: 01/16/24  4:38 PM  Result Value Ref Range   Glucose-Capillary 146 (H) 70 - 99 mg/dL    Comment: Glucose reference range applies only to samples taken after fasting for at least 8 hours.  Hepatitis B surface antigen     Status: None   Collection Time: 01/16/24  8:05 PM  Result Value Ref Range   Hepatitis B Surface Ag NON REACTIVE NON REACTIVE    Comment: Performed at [facility], [address], [place], [place] [address]  Glucose, capillary     Status: None   Collection Time: 01/17/24  1:48 AM  Result Value Ref Range   Glucose-Capillary 93 70 - 99 mg/dL    Comment: Glucose reference range applies only to samples taken after fasting for at least 8 hours.  Comprehensive metabolic panel     Status: Abnormal   Collection Time: 01/17/24  3:57 AM  Result Value Ref Range   Sodium 132 (L) 135 - 145 mmol/L   Potassium 4.5 3.5 - 5.1 mmol/L    Comment: Delta check noted    Chloride 89 (L) 98 - 111 mmol/L   CO2 27 22 - 32 mmol/L   Glucose, Bld 141 (H) 70 - 99 mg/dL    Comment: Glucose reference range applies only to samples taken after fasting for at least 8 hours.   BUN 42 (H) 8 - 23 mg/dL   Creatinine, Ser 1.57 (H) 0.61 - 1.24 mg/dL   Calcium  10.5 (H) 8.9 - 10.3 mg/dL   Total Protein 7.1 6.5 - 8.1 g/dL   Albumin  3.8 3.5 - 5.0 g/dL   AST 47 (H) 15 - 41 U/L   ALT 40 0 - 44 U/L  Alkaline Phosphatase 602 (H) 38 - 126 U/L   Total Bilirubin 0.4 0.0 - 1.2 mg/dL   GFR, Estimated 7 (L) >60 mL/min    Comment: (NOTE) Calculated using the  CKD-EPI Creatinine Equation (2021)    Anion gap 17 (H) 5 - 15    Comment: Performed at [facility], [address], [place], [place] [address]  CBC     Status: Abnormal   Collection Time: 01/17/24  3:57 AM  Result Value Ref Range   WBC 4.9 4.0 - 10.5 K/uL   RBC 3.23 (L) 4.22 - 5.81 MIL/uL   Hemoglobin 9.7 (L) 13.0 - 17.0 g/dL   HCT 70.0 (L) 60.9 - 47.9 %   MCV 92.6 80.0 - 100.0 fL   MCH 30.0 26.0 - 34.0 pg   MCHC 32.4 30.0 - 36.0 g/dL   RDW 85.4 88.4 - 84.4 %   Platelets 158 150 - 400 K/uL   nRBC 0.0 0.0 - 0.2 %    Comment: Performed at [facility], [address] [place], [place] [address]  Glucose, capillary     Status: Abnormal   Collection Time: 01/17/24  8:13 AM  Result Value Ref Range   Glucose-Capillary 122 (H) 70 - 99 mg/dL    Comment: Glucose reference range applies only to samples taken after fasting for at least 8 hours.    DG Chest 2 View Result Date: 01/16/2024 CLINICAL DATA:  Tested positive for the flu, presenting with decreased oxygen saturation. EXAM: CHEST - 2 VIEW COMPARISON:  November 01, 2023 FINDINGS: The cardiac silhouette is mildly enlarged and unchanged in size. There is moderate severity prominence of the central pulmonary vasculature. No acute infiltrate, pleural effusion or pneumothorax is identified. Multilevel degenerative changes are present throughout the thoracic spine. IMPRESSION: Cardiomegaly with moderate severity pulmonary vascular congestion. Electronically Signed   By: Suzen Dials M.D.   On: 01/16/2024 15:28    ROS: As per H&P, rest of the systems reviewed and are negative. Blood pressure 130/79, pulse 95, temperature 99.4 F (37.4 C), resp. rate 18, height 5' 8 (1.727 m), weight 91.6 kg, SpO2 96%. Gen: NAD, comfortable Respiratory: Coarse breath sound bilateral. Cardiovascular: Regular rate rhythm S1-S2 normal, no rubs GI: Abdomen soft, nondistended Extremities, no cyanosis or clubbing, no edema Skin: No rash or  ulcer Neurology: Alert, awake, following commands, oriented Dialysis Access: AV fistula has a good thrill and bruit  Assessment/Plan:  # Acute hypoxic respiratory failure/influenza/acute pulm edema: Recently diagnosed with the flu and on Tamiflu .  Chest x-ray with cardiomegaly and pulmonary vascular congestion.  Oxygen requirement was.  Received HD overnight with 2.8 L UF, not much improvement.  We will try to do extra HD today.  # ESRD: MWF, missed at least 2 session of dialysis outpatient.  Received dialysis overnight with 2.8 L UF.  I think he would benefit from extra HD today.  I will plan for HD if patient agrees.  # Hypertension: BP acceptable.  UF with HD.  # Anemia of ESRD: Hemoglobin 9.7.  May have some component of dilution.  Order a dose of Aranesp .  Monitor lab.  # Secondary hyperparathyroidism, hyperphosphatemia: Resume sevelamer  and Cinacalcet , follow lab.  Thank you for the consult, we will continue to follow.  Jenniah Bhavsar Amelie Romney 01/17/2024, 10:45 AM      [1]  Allergies Allergen Reactions   Codeine Anaphylaxis, Hives, Swelling and Other (See Comments)    Swelling all over body and the throat  Clindamycin /Lincomycin Nausea And Vomiting

## 2024-01-17 NOTE — Progress Notes (Signed)
 [facility] Progress Note Patient: Edgar Brewer FMW:[id number] DOB: 1960/04/18  DOA: 01/16/2024 DOS: the patient was seen and examined on 01/17/2024  Brief Hospital Course: Patient with PMH of ESRD on HD MWF, GERD, CAD, PAD, HTN, DM2, chronic HFpEF, obesity, anemia presented to the hospital due to missing hemodialysis. 12/22 went to PCPs office with complaints of shortness of breath started on 12/19.  Diagnosed with flu A.  Given prescription for Tamiflu . 12/23 presented to ED as unable to go to hemodialysis.  Received urgent hemodialysis, admitted in the hospital. 12/24 receives repeat hemodialysis. Assessment and Plan: Acute respiratory failure with hypoxia due to influenza A infection. Acute on chronic diastolic CHF with volume overload. Recently diagnosed with flu A outpatient.  Presents with missing hemodialysis complaining.  Appears to have some volume overload upon admission. Underwent hemodialysis on 12/23.  Still remains hypoxic.  Nephrology feels patient requires another hemodialysis and underwent hemodialysis on 12/24. Will monitor for improvement in respiratory status.  Influenza A infection. Treating with Tamiflu . Low threshold to initiate nebulizer therapy and steroids if deteriorates.  HTN. Blood pressure stable.  Monitor.  Type 2 diabetes mellitus. Uses basal bolus regimen at home. Currently on sliding scale here in the hospital. Monitor for improvement.  History of CVA and PAD. On Plavix  and aspirin . Also on statin.  Continue.  Anemia of chronic kidney disease. H&H relatively stable. Monitor for now.  Obesity.  Class I Body mass index is 30.1 kg/m.  Placing the patient at high risk for poor outcome.  Subjective: Ongoing shortness of breath but ongoing cough.  No nausea or vomiting.  No chest pain.   Physical Exam: Basal crackles. Expiratory wheezing. bowel sounds present nontender. Trace edema.  Data Reviewed: I have Reviewed nursing notes,  Vitals, and Lab results. Since last encounter, pertinent lab results CBC and BMP   . I have ordered test including CBC and BMP  .   Disposition: Status is: Inpatient Remains inpatient appropriate because: Monitor for improvement and respiratory status  heparin  injection 5,000 Units Start: 01/17/24 0600   Family Communication: No one at bedside Level of care: Telemetry   Vitals:   01/17/24 1521 01/17/24 1537 01/17/24 1644 01/17/24 1648  BP: (!) 103/48 117/74 (!) 96/49 (!) 107/51  Pulse: 85 86 88 88  Resp: (!) 25 (!) 21 (!) 27 (!) 28  Temp:   98.1 F (36.7 C)   TempSrc:   Oral   SpO2: 98% 97% 96% 98%  Weight:    89.8 kg  Height:         Author: Yetta Blanch, MD 01/17/2024 5:24 PM  Please look on [email or web address] to find out who is on call.

## 2024-01-17 NOTE — Plan of Care (Signed)

## 2024-01-17 NOTE — Progress Notes (Signed)
 Received patient in stretcher to unit.  Lethargic but answers questions appropriately, oriented.  Informed consent signed and in chart.   TX duration: 3.5  Patient tolerated well.  Transported back to the room  Alert, without acute distress.  Hand-off given to patient's nurse.   Access used: AVF Access issues: None  Total UF removed: 2800 Medication(s) given: None Post HD VS: T98.6-HR94-RR26 B/P145/74 Post HD weight: 91.6kg  Neville Seip, RN [facility] D[facility] Unit   01/16/24 2350  Vitals  Temp 98.6 F (37 C)  Temp Source Axillary  BP (!) 145/74  MAP (mmHg) 95  BP Location Right Arm  BP Method Automatic  Patient Position (if appropriate) Lying  Pulse Rate 94  Pulse Rate Source Monitor  ECG Heart Rate 95  Resp (!) 26  Weight 91.6 kg  Type of Weight Post-Dialysis  Oxygen Therapy  SpO2 99 %  O2 Device Nasal Cannula  O2 Flow Rate (L/min) 3 L/min  Patient Activity (if Appropriate) In bed  Pulse Oximetry Type Continuous  During Treatment Monitoring  Blood Flow Rate (mL/min) 0 mL/min  Arterial Pressure (mmHg) -46.06 mmHg  Venous Pressure (mmHg) 54.34 mmHg  TMP (mmHg) 26.26 mmHg  Ultrafiltration Rate (mL/min) 1231 mL/min  Dialysate Flow Rate (mL/min) 299 ml/min  Dialysate Potassium Concentration 2  Dialysate Calcium  Concentration 2.5  Duration of HD Treatment -hour(s) 3.5 hour(s)  Cumulative Fluid Removed (mL) per Treatment  2800.16  HD Safety Checks Performed Yes  Intra-Hemodialysis Comments Tolerated well;Tx completed  Post Treatment  Dialyzer Clearance Clear  Liters Processed 83.9  Fluid Removed (mL) 2800 mL  Tolerated HD Treatment Yes  AVG/AVF Arterial Site Held (minutes) 10 minutes  AVG/AVF Venous Site Held (minutes) 10 minutes

## 2024-01-17 NOTE — Consult Note (Signed)
 This remote consultation was conducted using EMR wound digital images downloaded by the staff member. Pertinent information was reviewed in order to determine recommendations. Coordinated care via secure chat with nurse.  WOC Nurse Consult Note: Reason for Consult: Right and Left Foot Lesions; Surgical wound, Left foot partial amputation Wound type: multiple lesions; vascular dysfunction Pressure Injury POA: No Wound azi:wzrmnupr, atrophic tissue Drainage all appear desiccated  Periwound: induration Patient may benefit from vascular studies, adding Vashe solution to reduce surface bioburden. Dressing procedure/placement/frequency:  Cleanse wounds to Right and Left Foot/Ankle with Vashe solution #[id number]- do not rinse off, pat dry with gauze and apply 2 layers of Xeroform yellow gauze over wound, cover with rolled Kerlix gauze, secure in place with tape. BID   Off-load heels at all times onto pillows/protective boots.  WOC will not follow and will remove patient from census task list.  Please reconsult if wound worsens in condition and notify provider.   Sherrilyn Hals MSN RN CWOCN WOC [facility]  [phone] (Available from 7-3 pm Mon-Friday)

## 2024-01-17 NOTE — Progress Notes (Signed)
 Patient was assisted to bathroom, upon checking on patient he was found playing in his fecal in the toilet. Attempted to get him cleaned but pt is admit about being bothered while on the toilet. Provider notified of concerns for patient mental status.

## 2024-01-17 NOTE — Progress Notes (Signed)
" °   01/17/24 1648  Vitals  BP (!) 107/51  MAP (mmHg) 65  BP Location Right Arm  BP Method Automatic  Patient Position (if appropriate) Lying  Pulse Rate 88  Pulse Rate Source Monitor  ECG Heart Rate 88  Resp (!) 28  Oxygen Therapy  SpO2 98 %  During Treatment Monitoring  Blood Flow Rate (mL/min) 200 mL/min  Arterial Pressure (mmHg) -98.41 mmHg  Venous Pressure (mmHg) 49.88 mmHg  TMP (mmHg) -2.83 mmHg  Ultrafiltration Rate (mL/min) 0 mL/min  Dialysate Flow Rate (mL/min) 300 ml/min  Dialysate Potassium Concentration 2  Dialysate Calcium  Concentration 2.5  Duration of HD Treatment -hour(s) 3.43 hour(s)  Cumulative Fluid Removed (mL) per Treatment  2973.02  HD Safety Checks Performed Yes  Intra-Hemodialysis Comments Tx completed    "

## 2024-01-17 NOTE — Plan of Care (Signed)
" °  Problem: Education: Goal: Ability to describe self-care measures that may prevent or decrease complications (Diabetes Survival Skills Education) will improve Outcome: Progressing   Problem: Fluid Volume: Goal: Ability to maintain a balanced intake and output will improve Outcome: Progressing   Problem: Nutritional: Goal: Maintenance of adequate nutrition will improve Outcome: Progressing   Problem: Skin Integrity: Goal: Risk for impaired skin integrity will decrease Outcome: Progressing   Problem: Tissue Perfusion: Goal: Adequacy of tissue perfusion will improve Outcome: Progressing   Problem: Education: Goal: Knowledge of General Education information will improve Description: Including pain rating scale, medication(s)/side effects and non-pharmacologic comfort measures Outcome: Progressing   Problem: Activity: Goal: Risk for activity intolerance will decrease Outcome: Progressing   Problem: Nutrition: Goal: Adequate nutrition will be maintained Outcome: Progressing   Problem: Elimination: Goal: Will not experience complications related to bowel motility Outcome: Progressing   Problem: Skin Integrity: Goal: Risk for impaired skin integrity will decrease Outcome: Progressing   "

## 2024-01-17 NOTE — Progress Notes (Signed)
 vs are stable, pt is cleaned up. He is having trouble just sitting up, or even standing up for periods at a time. Pt was dancing/tapping his foot to the music from the tv as I was doing his dressing change and couldn't hold himself up. As well as he was confused when we were talking to him about what I was doing when I already explained that I need to do his dressing change. This has all happened after patient came back from HD.

## 2024-01-18 DIAGNOSIS — J9601 Acute respiratory failure with hypoxia: Secondary | ICD-10-CM | POA: Diagnosis not present

## 2024-01-18 LAB — CBC WITH DIFFERENTIAL/PLATELET
Abs Immature Granulocytes: 0.04 K/uL (ref 0.00–0.07)
Basophils Absolute: 0 K/uL (ref 0.0–0.1)
Basophils Relative: 0 %
Eosinophils Absolute: 0 K/uL (ref 0.0–0.5)
Eosinophils Relative: 1 %
HCT: 35 % — ABNORMAL LOW (ref 39.0–52.0)
Hemoglobin: 11.2 g/dL — ABNORMAL LOW (ref 13.0–17.0)
Immature Granulocytes: 1 %
Lymphocytes Relative: 11 %
Lymphs Abs: 0.5 K/uL — ABNORMAL LOW (ref 0.7–4.0)
MCH: 29.7 pg (ref 26.0–34.0)
MCHC: 32 g/dL (ref 30.0–36.0)
MCV: 92.8 fL (ref 80.0–100.0)
Monocytes Absolute: 0.4 K/uL (ref 0.1–1.0)
Monocytes Relative: 8 %
Neutro Abs: 3.9 K/uL (ref 1.7–7.7)
Neutrophils Relative %: 79 %
Platelets: 197 K/uL (ref 150–400)
RBC: 3.77 MIL/uL — ABNORMAL LOW (ref 4.22–5.81)
RDW: 14.2 % (ref 11.5–15.5)
WBC: 4.9 K/uL (ref 4.0–10.5)
nRBC: 0 % (ref 0.0–0.2)

## 2024-01-18 LAB — COMPREHENSIVE METABOLIC PANEL WITH GFR
ALT: 44 U/L (ref 0–44)
AST: 46 U/L — ABNORMAL HIGH (ref 15–41)
Albumin: 4.3 g/dL (ref 3.5–5.0)
Alkaline Phosphatase: 635 U/L — ABNORMAL HIGH (ref 38–126)
Anion gap: 18 — ABNORMAL HIGH (ref 5–15)
BUN: 40 mg/dL — ABNORMAL HIGH (ref 8–23)
CO2: 25 mmol/L (ref 22–32)
Calcium: 11.1 mg/dL — ABNORMAL HIGH (ref 8.9–10.3)
Chloride: 91 mmol/L — ABNORMAL LOW (ref 98–111)
Creatinine, Ser: 7.13 mg/dL — ABNORMAL HIGH (ref 0.61–1.24)
GFR, Estimated: 8 mL/min — ABNORMAL LOW
Glucose, Bld: 128 mg/dL — ABNORMAL HIGH (ref 70–99)
Potassium: 4.4 mmol/L (ref 3.5–5.1)
Sodium: 134 mmol/L — ABNORMAL LOW (ref 135–145)
Total Bilirubin: 0.5 mg/dL (ref 0.0–1.2)
Total Protein: 8.1 g/dL (ref 6.5–8.1)

## 2024-01-18 LAB — HEPATITIS B SURFACE ANTIBODY, QUANTITATIVE: Hep B S AB Quant (Post): 9.3 m[IU]/mL — ABNORMAL LOW

## 2024-01-18 LAB — GLUCOSE, CAPILLARY
Glucose-Capillary: 109 mg/dL — ABNORMAL HIGH (ref 70–99)
Glucose-Capillary: 120 mg/dL — ABNORMAL HIGH (ref 70–99)
Glucose-Capillary: 110 mg/dL — ABNORMAL HIGH (ref 70–99)

## 2024-01-18 LAB — MAGNESIUM: Magnesium: 2.2 mg/dL (ref 1.7–2.4)

## 2024-01-18 MED ORDER — LANTUS SOLOSTAR 100 UNIT/ML ~~LOC~~ SOPN: 30.0000 [IU] | PEN_INJECTOR | Freq: Every day | SUBCUTANEOUS | Status: AC

## 2024-01-18 MED ORDER — OSELTAMIVIR PHOSPHATE 30 MG PO CAPS: 30.0000 mg | ORAL_CAPSULE | Freq: Once | ORAL | Status: DC

## 2024-01-18 MED ORDER — OSELTAMIVIR PHOSPHATE 30 MG PO CAPS
30.0000 mg | ORAL_CAPSULE | Freq: Once | ORAL | Status: AC
  Administered 2024-01-18: 30 mg via ORAL
  Filled 2024-01-18: qty 1

## 2024-01-18 MED ORDER — CHLORHEXIDINE GLUCONATE CLOTH 2 % EX PADS
6.0000 | MEDICATED_PAD | Freq: Every day | CUTANEOUS | Status: DC
  Administered 2024-01-18: 6 via TOPICAL

## 2024-01-18 NOTE — TOC Initial Note (Signed)
 Transition of Care [facility]) - Initial/Assessment Note    Patient Details  Name: Edgar Brewer MRN: [id number] Date of Birth: [birth year]  [facility] CM/SW Contact:    Justina Delcia Czar, RN Phone Number: [phone] 01/18/2024, 4:13 PM   Clinical Narrative:                  Spoke to pt and offered choice for [facility]. Agreeable to [facility] for [facility]. Contacted [employer] rep, Lynette with new referral.  Contacted [employer] rep, Jermaine for oxygen.  Brother will provide transportation home.  Faxed summary to PCP to schedule hospital follow up appt.    Expected Discharge Plan: Home w Home Health Services Barriers to Discharge: No Barriers Identified   Patient Goals and CMS Choice Patient states their goals for this hospitalization and ongoing recovery are:: wants to go home CMS Medicare.gov Compare Post Acute Care list provided to:: Patient Choice offered to / list presented to : Patient      Expected Discharge Plan and Services   Discharge Planning Services: CM Consult Post Acute Care Choice: Home Health Living arrangements for the past 2 months: Single Family Home Expected Discharge Date: 01/18/24               DME Arranged: Oxygen DME Agency: [employer]       HH Arranged: RN, PT HH Agency: [facility] Date HH Agency Contacted: 01/18/24 Time HH Agency Contacted: 1612 Representative spoke with at [facility] Agency: Arna  Prior Living Arrangements/Services Living arrangements for the past 2 months: Single Family Home Lives with:: Parents Patient language and need for interpreter reviewed:: Yes        Need for Family Participation in Patient Care: No (Comment) Care giver support system in place?: Yes (comment) Current home services: DME (rolling walker, cane) Criminal Activity/Legal Involvement Pertinent to Current Situation/Hospitalization: No - Comment as needed  Activities of Daily Living   ADL Screening (condition at time of admission) Independently  performs ADLs?: Yes (appropriate for developmental age) Is the patient deaf or have difficulty hearing?: Yes Does the patient have difficulty seeing, even when wearing glasses/contacts?: No Does the patient have difficulty concentrating, remembering, or making decisions?: No  Permission Sought/Granted Permission sought to share information with : Case Manager, Family Supports, PCP Permission granted to share information with : Yes, Verbal Permission Granted  Share Information with NAME: Trayvion Embleton  Permission granted to share info w AGENCY: Home Health, DME, PCP  Permission granted to share info w Relationship: mother  Permission granted to share info w Contact Information: [phone]  Emotional Assessment   Attitude/Demeanor/Rapport: Engaged Affect (typically observed): Accepting Orientation: : Oriented to Self, Oriented to Place, Oriented to  Time, Oriented to Situation   Psych Involvement: No (comment)  Admission diagnosis:  Influenza A [J10.1] Influenza B [J10.1] Dialysis patient [Z99.2] Acute hypoxic respiratory failure (HCC) [J96.01] Acute respiratory failure with hypoxia (HCC) [J96.01] 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   Abnormal echocardiogram 10/31/2023   Bradycardia 10/30/2023   Pyogenic inflammation of bone (HCC) 10/20/2023   PAD (peripheral artery disease) 10/20/2023   Neuropathy    Sepsis (HCC) 10/19/2023   Hyperkalemia 09/20/2023   Hyponatremia 09/20/2023   Gangrene of left foot (HCC) 09/19/2023   Fatty liver 02/10/2022   Wound of left leg 05/08/2020   Left leg cellulitis 05/08/2020   GERD (gastroesophageal reflux disease) 03/25/2020   Aortic stenosis 02/25/2020   Type 2 diabetes  mellitus with diabetic neuropathy, unspecified (HCC) 02/25/2020   History of stroke 02/25/2020   Acute respiratory failure with hypoxia (HCC) 11/15/2018   Gallbladder abscess 11/15/2018   Elevated LFTs 10/18/2018   ESRD  on dialysis (HCC) 10/03/2018   Other disorders of phosphorus metabolism 01/19/2018   Secondary hyperparathyroidism of renal origin 09/27/2017   CKD (chronic kidney disease), stage IV (HCC) 01/14/2015   Diabetes mellitus type 2 with complications (HCC) 01/14/2015   Obesity 01/14/2015   Essential hypertension 01/14/2015   Anemia of chronic kidney failure 01/14/2015   Chronic diastolic CHF (congestive heart failure) (HCC) 01/13/2015   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 [place] [address] Phone: [phone] Fax: [phone]  [facility] [place]  - [place], [place] - [address] [place] [address] Phone: [phone] Fax: [phone]  [facility] [address] [place] [address] Phone: [phone] Fax: [phone]  [facility] [address] [place] [address] Phone: [phone] Fax: [phone]     Social Drivers of Health (SDOH) Social History: SDOH Screenings   Food Insecurity: No Food Insecurity (01/17/2024)  Housing: Low Risk (01/17/2024)  Transportation Needs: No Transportation Needs (01/17/2024)  Utilities: Not At Risk (01/17/2024)  Alcohol Screen: Low Risk (01/11/2024)  Depression (PHQ2-9): Low Risk (01/15/2024)  Financial Resource Strain: Low Risk (04/19/2022)  Physical Activity: Inactive (01/11/2024)  Social Connections: Socially Isolated (01/11/2024)  Stress: No Stress Concern Present (01/11/2024)  Tobacco Use: Medium Risk (01/16/2024)  Health Literacy: Adequate Health Literacy (01/11/2024)   SDOH Interventions:     Readmission Risk Interventions    01/18/2024    4:01 PM 10/20/2023    4:09 PM  Readmission Risk  Prevention Plan  Transportation Screening Complete Complete  PCP or Specialist Appt within 3-5 Days Complete Complete  HRI or Home Care Consult Complete Complete  Social Work Consult for Recovery Care Planning/Counseling Complete Complete  Palliative Care Screening Not Applicable Not Applicable  Medication Review Oceanographer) Complete

## 2024-01-18 NOTE — Plan of Care (Signed)

## 2024-01-18 NOTE — Discharge Summary (Signed)
 " Physician Discharge Summary   Patient: Edgar Brewer MRN: [id number] DOB: [birth year]  Admit date:     01/16/2024  Discharge date: 01/18/2024  Discharge Physician: Yetta Blanch  PCP: Katrinka Garnette KIDD, MD  Recommendations at discharge: Follow-up with PCP in 1 week. Continue hemodialysis as scheduled. Follow-up with podiatry for wound recheck.  Continue dressing changes as already recommended by podiatry.   Contact information for follow-up providers     Katrinka Garnette KIDD, MD. Schedule an appointment as soon as possible for a visit in 1 week(s).   Specialty: Family Medicine Contact information: [address] [place] [address] [phone]         Malvin Marsa FALCON, [place]. Schedule an appointment as soon as possible for a visit in 2 week(s).   Specialty: Podiatry Why: For wound re-check Contact information: [address] [place] [address] [phone]              Contact information for after-discharge care     Durable Medical Equipment     [facility] (DME) Follow up.   Service: Durable Medical Equipment Why: Home Oxygen Contact information: [address] [employer] [place]  [phone]             Home Medical Care     [facility]) .   Service: Home Health Services Contact information: [phone] [place]  [phone]                    Hospital Course: Patient with PMH of ESRD on HD MWF, GERD, CAD, PAD, HTN, DM2, chronic HFpEF, obesity, anemia presented to the hospital due to missing hemodialysis. 12/22 went to PCPs office with complaints of shortness of breath started on 12/19.  Diagnosed with flu A.  Given prescription for Tamiflu . 12/23 presented to ED as unable to go to hemodialysis.  Received urgent hemodialysis, admitted in the hospital. 12/24 receives repeat hemodialysis.  Assessment and Plan: Acute  respiratory failure with hypoxia due to influenza A infection. Acute on chronic diastolic CHF with volume overload. Recently diagnosed with flu A outpatient. Presents with missing hemodialysis due to deconditioning. Appears to have some volume overload upon admission. Underwent back-to-back hemodialysis with improvement in volume status. Oxygenation improving but still requires oxygen upon discharge. Will continue 2 L of oxygen upon discharge.   Influenza A infection. Former smoker.  Possible undiagnosed COPD. Treating with Tamiflu . Continue home regimen. Patient referred for lung cancer screening based on smoking history.   HTN. Blood pressure stable.  Monitor.   Type 2 diabetes mellitus. Continue home regimen although adjusted based on oral intake in the hospital.   History of CVA and PAD. On Plavix  and aspirin . Also on statin.  Continue.   Anemia of chronic kidney disease. H&H relatively stable. Monitor for now.   Obesity.  Class I Body mass index is 30.1 kg/m.  Placing the patient at high risk for poor outcome.  Consultants:  Nephrology  Procedures performed:  Hemodialysis  DISCHARGE MEDICATION: Allergies as of 01/18/2024       Reactions   Codeine Anaphylaxis, Hives, Swelling, Other (See Comments)   Swelling all over body and the throat   Clindamycin /lincomycin Nausea And Vomiting        Medication List     TAKE these medications    aspirin  EC 81 MG tablet Take 81 mg by mouth daily. Swallow whole.   b complex-vitamin  c-folic acid  0.8 MG Tabs tablet Take 1 tablet by mouth in the morning.   cinacalcet  60 MG tablet Commonly known as: SENSIPAR  Take 60 mg by mouth every Tuesday, Thursday, and Saturday at 6 PM. In the evening.   clopidogrel  75 MG tablet Commonly known as: Plavix  Take 1 tablet (75 mg total) by mouth daily.   diphenhydramine -acetaminophen  25-500 MG Tabs tablet Commonly known as: TYLENOL  PM Take 1 tablet by mouth at bedtime as needed  (Sleep/Pain).   gabapentin  100 MG capsule Commonly known as: NEURONTIN  TAKE 1 CAPSULE BY MOUTH THREE TIMES DAILY   Lantus  SoloStar 100 UNIT/ML Solostar Pen Generic drug: insulin  glargine Inject 30 Units into the skin daily. What changed: how much to take   omeprazole  20 MG capsule Commonly known as: PRILOSEC Take 1 capsule by mouth once daily   oseltamivir  30 MG capsule Commonly known as: Tamiflu  Take 30mg  today, then take 30mg  3x/wk after dialysis x 5 doses   rosuvastatin  10 MG tablet Commonly known as: CRESTOR  Take 1 tablet by mouth once daily   sevelamer  carbonate 800 MG tablet Commonly known as: RENVELA  Take 2 tablets (1,600 mg total) by mouth 3 (three) times daily with meals.   STOOL SOFTENER PO Take 1-2 tablets by mouth 2 (two) times daily as needed (Constipation).               Durable Medical Equipment  (From admission, onward)           Start     Ordered   01/18/24 1216  For home use only DME oxygen  Once       Question Answer Comment  Length of Need Lifetime   Mode or (Route) Nasal cannula   Liters per Minute 3   Frequency Continuous (stationary and portable oxygen unit needed)   Oxygen delivery system: Gas      01/18/24 1215              Discharge Care Instructions  (From admission, onward)           Start     Ordered   01/18/24 0000  Discharge wound care:       Comments: Per podiatry, Recommend we proceed with Monday Wednesday Friday dressing changes with wound wash spray to cleanse the wounds and then apply silver alginate or calcium  alginate base layer followed by 4 x 4 gauze Kerlix Ace wrap.   01/18/24 1224           Disposition: Home Diet recommendation: Renal diet  Discharge Exam: Vitals:   01/18/24 1432 01/18/24 1436 01/18/24 1438 01/18/24 1445  BP: (!) 94/53 (!) 96/58 (!) 92/49   Pulse: 78 84 87   Resp: 18 18    Temp:      TempSrc:      SpO2: 100% 94% 94% 92%  Weight:      Height:       General: in Mild  distress, No Rash Cardiovascular: S1 and S2 Present, No Murmur Respiratory: Good respiratory effort, Bilateral Air entry present. No Crackles, No wheezes Abdomen: Bowel Sound present, No tenderness Extremities: No edema Neuro: Alert and oriented x3, no new focal deficit.  No asterixis.  Filed Weights   01/17/24 0153 01/17/24 1310 01/17/24 1648  Weight: 91.6 kg 91.5 kg 89.8 kg   Condition at discharge: stable  The results of significant diagnostics from this hospitalization (including imaging, microbiology, ancillary and laboratory) are listed below for reference.   Imaging Studies: DG Chest 2 View Result  Date: 01/16/2024 CLINICAL DATA:  Tested positive for the flu, presenting with decreased oxygen saturation. EXAM: CHEST - 2 VIEW COMPARISON:  November 01, 2023 FINDINGS: The cardiac silhouette is mildly enlarged and unchanged in size. There is moderate severity prominence of the central pulmonary vasculature. No acute infiltrate, pleural effusion or pneumothorax is identified. Multilevel degenerative changes are present throughout the thoracic spine. IMPRESSION: Cardiomegaly with moderate severity pulmonary vascular congestion. Electronically Signed   By: Suzen Dials M.D.   On: 01/16/2024 15:28   VAS US  LOWER EXTREMITY ARTERIAL DUPLEX Result Date: 01/09/2024 LOWER EXTREMITY ARTERIAL DUPLEX STUDY Patient Name:  Japhet Morgenthaler  Date of Exam:   01/09/2024 Medical Rec #: [id number]     Accession #:    [id number] Date of Birth: 23-Jun-1960     Patient Gender: M Patient Age:   79 years Exam Location:  [address] Procedure:      VAS US  LOWER EXTREMITY ARTERIAL DUPLEX Referring Phys: DEBBY ROBERTSON --------------------------------------------------------------------------------  Indications: Claudication, ulceration, and peripheral artery disease. High Risk Factors: Hypertension, Diabetes, no history of smoking, prior CVA.  Vascular Interventions: 12/01/23 Right femoropopliteal drug coated angioplasty                          10/23/23 Left transmetatarsal foot amputation. Current ABI:            B/L [place] Comparison Study: 12/01/23 Angiogram Performing Technologist: Norleen Buck RVT, RDMS  Examination Guidelines: A complete evaluation includes B-mode imaging, spectral Doppler, color Doppler, and power Doppler as needed of all accessible portions of each vessel. Bilateral testing is considered an integral part of a complete examination. Limited examinations for reoccurring indications may be performed as noted.  +-----------+--------+-----+--------+---------+--------+ RIGHT      PSV cm/sRatioStenosisWaveform Comments +-----------+--------+-----+--------+---------+--------+ CFA Distal 198                  biphasic          +-----------+--------+-----+--------+---------+--------+ DFA        112                  biphasic          +-----------+--------+-----+--------+---------+--------+ SFA Prox   170                  triphasic         +-----------+--------+-----+--------+---------+--------+ SFA Mid    106                  triphasic         +-----------+--------+-----+--------+---------+--------+ SFA Distal 92                   triphasic         +-----------+--------+-----+--------+---------+--------+ POP Prox   110                  triphasicStent    +-----------+--------+-----+--------+---------+--------+ POP Distal 115                  triphasic         +-----------+--------+-----+--------+---------+--------+ ATA Distal 87                   biphasic          +-----------+--------+-----+--------+---------+--------+ PTA Distal 90                   triphasic         +-----------+--------+-----+--------+---------+--------+ PERO Distal72  biphasic          +-----------+--------+-----+--------+---------+--------+  Right Stent(s): +---------------+--------+--------+---------+--------+ FemoropoplitealPSV cm/sStenosisWaveform  Comments +---------------+--------+--------+---------+--------+ Prox to Stent  110             triphasic         +---------------+--------+--------+---------+--------+ Proximal Stent 81              biphasic          +---------------+--------+--------+---------+--------+ Mid Stent      99              triphasic         +---------------+--------+--------+---------+--------+ Distal Stent   114             triphasic         +---------------+--------+--------+---------+--------+ Distal to Stent117             biphasic          +---------------+--------+--------+---------+--------+    Summary: Right: Patent Right femoropopliteal stent.  See table(s) above for measurements and observations. Electronically signed by Debby Robertson on 01/09/2024 at 1:29:49 PM.    Final    VAS US  ABI WITH/WO TBI Result Date: 01/09/2024  LOWER EXTREMITY DOPPLER STUDY Patient Name:  Nicanor Mendolia  Date of Exam:   01/09/2024 Medical Rec #: [id number]     Accession #:    [id number] Date of Birth: 15-Mar-1960     Patient Gender: M Patient Age:   51 years Exam Location:  [address] Procedure:      VAS US  ABI WITH/WO TBI Referring Phys: DEBBY ROBERTSON --------------------------------------------------------------------------------  Indications: Claudication, ulceration, and peripheral artery disease. left              transmetatarsal amputation High Risk Factors: Hypertension, Diabetes, no history of smoking, prior CVA. Other Factors: CHF, Neuropathy, CKD stage 4, Chronic Venous insufficiency.  Vascular Interventions: 12/01/23 Right femoropopliteal drug coated angioplasty                         10/23/23 Left transmetatarsal foot amputation. Comparison Study: 11/23/23 ABI Performing Technologist: Norleen Buck RVT, RDMS  Examination Guidelines: A complete evaluation includes at minimum, Doppler waveform signals and systolic blood pressure reading at the level of bilateral brachial, anterior tibial, and posterior  tibial arteries, when vessel segments are accessible. Bilateral testing is considered an integral part of a complete examination. Photoelectric Plethysmograph (PPG) waveforms and toe systolic pressure readings are included as required and additional duplex testing as needed. Limited examinations for reoccurring indications may be performed as noted.  ABI Findings: +---------+------------------+-----+-----------+--------+ Right    Rt Pressure (mmHg)IndexWaveform   Comment  +---------+------------------+-----+-----------+--------+ Brachial 107                                        +---------+------------------+-----+-----------+--------+ PTA      254               2.37 multiphasicCNO      +---------+------------------+-----+-----------+--------+ DP       254               2.37 multiphasicCNO      +---------+------------------+-----+-----------+--------+ Great Toe34                0.32 Abnormal            +---------+------------------+-----+-----------+--------+ +---------+------------------+-----+-----------+---------+ Left     Lt Pressure (mmHg)IndexWaveform   Comment   +---------+------------------+-----+-----------+---------+  Brachial 106                                         +---------+------------------+-----+-----------+---------+ PTA      126               1.18 monophasic           +---------+------------------+-----+-----------+---------+ DP       254               2.37 multiphasic          +---------+------------------+-----+-----------+---------+ Great Toe0                 0.00 Absent     Amputated +---------+------------------+-----+-----------+---------+ +-------+-----------+-----------+------------+------------+ ABI/TBIToday's ABIToday's TBIPrevious ABIPrevious TBI +-------+-----------+-----------+------------+------------+ Right  [place]         0.32       [place]          Bandages      +-------+-----------+-----------+------------+------------+ Left   [place]         Amputated  [place]          Amputated    +-------+-----------+-----------+------------+------------+  Arterial wall calcification precludes accurate ankle pressures and ABIs. Bilateral ABIs appear essentially unchanged.  Summary: Right: Resting right ankle-brachial index indicates noncompressible right lower extremity arteries. The right toe-brachial index is abnormal.  Left: Resting left ankle-brachial index indicates noncompressible left lower extremity arteries.  *See table(s) above for measurements and observations.  Electronically signed by Debby Robertson on 01/09/2024 at 1:29:27 PM.    Final     Microbiology: Results for orders placed or performed during the hospital encounter of 10/19/23  Resp panel by RT-PCR (RSV, Flu A&B, Covid) Anterior Nasal Swab     Status: None   Collection Time: 10/19/23  9:00 AM   Specimen: Anterior Nasal Swab  Result Value Ref Range Status   SARS Coronavirus 2 by RT PCR NEGATIVE NEGATIVE Final   Influenza A by PCR NEGATIVE NEGATIVE Final   Influenza B by PCR NEGATIVE NEGATIVE Final    Comment: (NOTE) The Xpert Xpress SARS-CoV-2/FLU/RSV plus assay is intended as an aid in the diagnosis of influenza from Nasopharyngeal swab specimens and should not be used as a sole basis for treatment. Nasal washings and aspirates are unacceptable for Xpert Xpress SARS-CoV-2/FLU/RSV testing.  Fact Sheet for Patients: bloggercourse.com  Fact Sheet for Healthcare Providers: seriousbroker.it  This test is not yet approved or cleared by the [place]  FDA and has been authorized for detection and/or diagnosis of SARS-CoV-2 by FDA under an Emergency Use Authorization (EUA). This EUA will remain in effect (meaning this test can be used) for the duration of the COVID-19 declaration under Section 564(b)(1) of the Act, 21 U.S.C. section 360bbb-3(b)(1),  unless the authorization is terminated or revoked.     Resp Syncytial Virus by PCR NEGATIVE NEGATIVE Final    Comment: (NOTE) Fact Sheet for Patients: bloggercourse.com  Fact Sheet for Healthcare Providers: seriousbroker.it  This test is not yet approved or cleared by the [place]  FDA and has been authorized for detection and/or diagnosis of SARS-CoV-2 by FDA under an Emergency Use Authorization (EUA). This EUA will remain in effect (meaning this test can be used) for the duration of the COVID-19 declaration under Section 564(b)(1) of the Act, 21 U.S.C. section 360bbb-3(b)(1), unless the authorization is terminated or revoked.  Performed at [facility], [address] [place], [place]  [address]   Blood Culture (routine x 2)     Status: None   Collection Time: 10/19/23  9:00 AM   Specimen: BLOOD RIGHT ARM  Result Value Ref Range Status   Specimen Description BLOOD RIGHT ARM  Final   Special Requests   Final    BOTTLES DRAWN AEROBIC AND ANAEROBIC Blood Culture adequate volume   Culture   Final    NO GROWTH 5 DAYS Performed at [facility], [address], [place], [place] [address]    Report Status 10/24/2023 FINAL  Final  Blood Culture (routine x 2)     Status: None   Collection Time: 10/19/23  9:05 AM   Specimen: BLOOD RIGHT ARM  Result Value Ref Range Status   Specimen Description BLOOD RIGHT ARM  Final   Special Requests   Final    BOTTLES DRAWN AEROBIC AND ANAEROBIC Blood Culture adequate volume   Culture   Final    NO GROWTH 5 DAYS Performed at [facility], [address] [place], [place] [address]    Report Status 10/24/2023 FINAL  Final  Aerobic/Anaerobic Culture w Gram Stain (surgical/deep wound)     Status: None   Collection Time: 10/23/23  4:40 PM   Specimen: Wound; Tissue  Result Value Ref Range Status   Specimen Description TISSUE LEFT FOOT  Final   Special Requests NONE  Final    Gram Stain NO WBC SEEN RARE GRAM NEGATIVE RODS   Final   Culture   Final    MODERATE ENTEROCOCCUS FAECALIS FEW STENOTROPHOMONAS MALTOPHILIA FEW STAPHYLOCOCCUS AUREUS NO ANAEROBES ISOLATED Sent to [facility] for further susceptibility testing. SEE SEPARATE REPORT Performed at [facility], [address] [place], [place] [address]    Report Status 11/05/2023 FINAL  Final   Organism ID, Bacteria ENTEROCOCCUS FAECALIS  Final   Organism ID, Bacteria STAPHYLOCOCCUS AUREUS  Final      Susceptibility   Enterococcus faecalis - MIC*    AMPICILLIN  <=2 SENSITIVE Sensitive     VANCOMYCIN  2 SENSITIVE Sensitive     GENTAMICIN SYNERGY SENSITIVE Sensitive     * MODERATE ENTEROCOCCUS FAECALIS   Staphylococcus aureus - MIC*    CIPROFLOXACIN <=0.5 SENSITIVE Sensitive     ERYTHROMYCIN <=0.25 SENSITIVE Sensitive     GENTAMICIN <=0.5 SENSITIVE Sensitive     OXACILLIN 0.5 SENSITIVE Sensitive     TETRACYCLINE <=1 SENSITIVE Sensitive     VANCOMYCIN  <=0.5 SENSITIVE Sensitive     TRIMETH/SULFA <=10 SENSITIVE Sensitive     CLINDAMYCIN  <=0.25 SENSITIVE Sensitive     RIFAMPIN <=0.5 SENSITIVE Sensitive     Inducible Clindamycin  NEGATIVE Sensitive     LINEZOLID  2 SENSITIVE Sensitive     * FEW STAPHYLOCOCCUS AUREUS  Susceptibility, Aer + Anaerob     Status: Abnormal   Collection Time: 10/23/23  4:40 PM  Result Value Ref Range Status   Suscept, Aer + Anaerob Final report (A)  Corrected    Comment: (NOTE) Performed At: [facility] [employer] [address] [place], [place] [id number] Jennette Shorter MD Ey:[id number] CORRECTED ON 10/06 AT 0835: PREVIOUSLY REPORTED AS Preliminary report    Source STENOTROPHOMONAS MALTOPHILIA/ TISSUE LEFT FOOT  Final    Comment: Performed at [facility], [address], [place], [place] [address]  Susceptibility Result     Status: Abnormal   Collection Time: 10/23/23  4:40 PM  Result Value Ref Range Status   Suscept Result 1 Comment (A)  Final  Comment:  (NOTE) Stenotrophomonas maltophilia Identification performed by account, not confirmed by this laboratory. Levofloxacin  and Trimethoprim-Sulfamethoxazole should not be used alone for antimicrobial therapy (CLSI).    Antimicrobial Suscept Comment  Corrected    Comment: (NOTE)      ** S = Susceptible; I = Intermediate; R = Resistant **                   P = Positive; N = Negative            MICS are expressed in micrograms per mL   Antibiotic                 RSLT#1    RSLT#2    RSLT#3    RSLT#4 Levofloxacin                    S Minocycline                    S Trimethoprim/Sulfa             S Performed At: [facility] [employer] [address] [place], [place] [id number] Jennette Shorter MD Ey:[id number]   Culture, blood (Routine X 2) w Reflex to ID Panel     Status: None   Collection Time: 11/01/23  7:48 PM   Specimen: BLOOD  Result Value Ref Range Status   Specimen Description BLOOD SITE NOT SPECIFIED  Final   Special Requests   Final    BOTTLES DRAWN AEROBIC AND ANAEROBIC Blood Culture results may not be optimal due to an inadequate volume of blood received in culture bottles   Culture   Final    NO GROWTH 5 DAYS Performed at [facility], [address] [place], [place] [address]    Report Status 11/06/2023 FINAL  Final  Culture, blood (Routine X 2) w Reflex to ID Panel     Status: None   Collection Time: 11/01/23  7:57 PM   Specimen: BLOOD  Result Value Ref Range Status   Specimen Description BLOOD SITE NOT SPECIFIED  Final   Special Requests   Final    BOTTLES DRAWN AEROBIC AND ANAEROBIC Blood Culture adequate volume   Culture   Final    NO GROWTH 5 DAYS Performed at [facility], [address] [place], [place] [address]    Report Status 11/06/2023 FINAL  Final   Labs: CBC: Recent Labs  Lab 01/16/24 1526 01/17/24 0357 01/18/24 0347  WBC 4.8 4.9 4.9  NEUTROABS 3.9  --  3.9  HGB 9.8* 9.7* 11.2*  HCT 29.9* 29.9* 35.0*  MCV 94.0 92.6 92.8  PLT 155  158 197   Basic Metabolic Panel: Recent Labs  Lab 01/16/24 1526 01/17/24 0357 01/18/24 0347  NA 132* 132* 134*  K 6.1* 4.5 4.4  CL 89* 89* 91*  CO2 24 27 25   GLUCOSE 163* 141* 128*  BUN 83* 42* 40*  CREATININE 13.90* 8.42* 7.13*  CALCIUM  10.5* 10.5* 11.1*  MG  --   --  2.2   Liver Function Tests: Recent Labs  Lab 01/16/24 1526 01/17/24 0357 01/18/24 0347  AST 46* 47* 46*  ALT 39 40 44  ALKPHOS 576* 602* 635*  BILITOT 0.4 0.4 0.5  PROT 7.1 7.1 8.1  ALBUMIN  3.9 3.8 4.3   CBG: Recent Labs  Lab 01/17/24 0813 01/17/24 1154 01/17/24 2113 01/18/24 0813 01/18/24 1220  GLUCAP 122* 160* 237* 109* 120*    Discharge time spent: 35 minutes  Author: Yetta Blanch, MD  [facility]    "

## 2024-01-18 NOTE — Progress Notes (Addendum)
 SATURATION QUALIFICATIONS: (This note is used to comply with regulatory documentation for home oxygen)   Patient Saturations on Room Air at Rest = 86%   Patient Saturations on Room Air while Ambulating = 82%   Patient Saturations on 2Liters of oxygen while Ambulating = 92%   Please briefly explain why patient needs home oxygen: Patients' oxygenation drops below 90% while on Room air, whether ambulating or lying still.

## 2024-01-18 NOTE — Progress Notes (Signed)
 [facility] NEPHROLOGY PROGRESS NOTE  Assessment/ Plan: Pt is a 63 y.o. yo male with past medical history significant for hypertension, diabetes, CVA, PAD, CHF, anemia, ESRD on HD admitted with hypoxia and shortness of breath, seen as a consultation for the management of ESRD.   OP HD order: Dialyzes at [facility], MWF, 4hr, 2k 2.5 ca, EDW 91.4kg. LUE AVF,  # Acute hypoxic respiratory failure/influenza/acute pulm edema: Recently diagnosed with the flu and on Tamiflu .  Chest x-ray with cardiomegaly and pulmonary vascular congestion.  Significant improvement of breathing after 2 dialysis session.  Currently on room air.   # ESRD: MWF, missed at least 2 session of dialysis outpatient.  He received dialysis last 2 days in a row with significant improvement of respiratory status.  He is now on room air.  Plan for next HD tomorrow.   # Hypertension: BP soft but asymptomatic.  UF with HD.   # Anemia of ESRD: Hemoglobin at goal, no ESA needed.    # Secondary hyperparathyroidism, hyperphosphatemia, hypercalcemia: Resume sevelamer  and Cinacalcet , no VDRA, follow lab.   Subjective: Seen and examined.  Denies nausea, vomiting, chest pain, shortness of breath.  He is on room air.  Tolerated dialysis well yesterday with 3 L ultrafiltration. Objective Vital signs in last 24 hours: Vitals:   01/17/24 1836 01/17/24 2029 01/18/24 0445 01/18/24 0813  BP: 109/71 (!) 102/40 (!) 99/57 (!) 96/48  Pulse: 88 88 85 82  Resp: 20   16  Temp: 98.3 F (36.8 C) 98.1 F (36.7 C) 99 F (37.2 C) 98 F (36.7 C)  TempSrc: Oral     SpO2: 98% 93% 100% 93%  Weight:      Height:       Weight change: -2.9 kg  Intake/Output Summary (Last 24 hours) at 01/18/2024 1034 Last data filed at 01/17/2024 2100 Gross per 24 hour  Intake 120 ml  Output 3000 ml  Net -2880 ml       Labs: RENAL PANEL Recent Labs  Lab 01/16/24 1526 01/17/24 0357 01/18/24 0347  NA 132* 132* 134*  K 6.1* 4.5 4.4  CL 89* 89*  91*  CO2 24 27 25   GLUCOSE 163* 141* 128*  BUN 83* 42* 40*  CREATININE 13.90* 8.42* 7.13*  CALCIUM  10.5* 10.5* 11.1*  MG  --   --  2.2  ALBUMIN  3.9 3.8 4.3    Liver Function Tests: Recent Labs  Lab 01/16/24 1526 01/17/24 0357 01/18/24 0347  AST 46* 47* 46*  ALT 39 40 44  ALKPHOS 576* 602* 635*  BILITOT 0.4 0.4 0.5  PROT 7.1 7.1 8.1  ALBUMIN  3.9 3.8 4.3   No results for input(s): LIPASE, AMYLASE in the last 168 hours. No results for input(s): AMMONIA in the last 168 hours. CBC: Recent Labs    11/06/23 0755 12/01/23 0842 01/16/24 1526 01/17/24 0357 01/17/24 1946 01/18/24 0347  HGB 10.3* 10.2* 9.8* 9.7*  --  11.2*  MCV 93.0  --  94.0 92.6  --  92.8  VITAMINB12  --   --   --   --  554  --     Cardiac Enzymes: No results for input(s): CKTOTAL, CKMB, CKMBINDEX, TROPONINI in the last 168 hours. CBG: Recent Labs  Lab 01/17/24 0148 01/17/24 0813 01/17/24 1154 01/17/24 2113 01/18/24 0813  GLUCAP 93 122* 160* 237* 109*    Iron Studies: No results for input(s): IRON, TIBC, TRANSFERRIN, FERRITIN in the last 72 hours. Studies/Results: DG Chest 2 View Result Date: 01/16/2024  CLINICAL DATA:  Tested positive for the flu, presenting with decreased oxygen saturation. EXAM: CHEST - 2 VIEW COMPARISON:  November 01, 2023 FINDINGS: The cardiac silhouette is mildly enlarged and unchanged in size. There is moderate severity prominence of the central pulmonary vasculature. No acute infiltrate, pleural effusion or pneumothorax is identified. Multilevel degenerative changes are present throughout the thoracic spine. IMPRESSION: Cardiomegaly with moderate severity pulmonary vascular congestion. Electronically Signed   By: Suzen Dials M.D.   On: 01/16/2024 15:28    Medications: Infusions:   Scheduled Medications:  aspirin  EC  81 mg Oral Daily   Chlorhexidine  Gluconate Cloth  6 each Topical Q0600   cinacalcet   30 mg Oral Q M,W,F   clopidogrel   75 mg Oral  Daily   gabapentin   100 mg Oral TID   heparin   5,000 Units Subcutaneous Q8H   insulin  aspart  0-6 Units Subcutaneous TID WC   insulin  glargine  25 Units Subcutaneous Daily   pantoprazole   40 mg Oral Daily   rosuvastatin   10 mg Oral Daily   sevelamer  carbonate  1,600 mg Oral TID WC   sodium chloride  flush  3 mL Intravenous Q12H    have reviewed scheduled and prn medications.  Physical Exam: General:NAD, comfortable Heart:RRR, s1s2 nl Lungs:clear b/l, no crackle Abdomen:soft, Non-tender, non-distended Extremities:No edema Dialysis Access: AV fistula has good thrill and bruit.  Burgandy Hackworth Prasad Shanteria Laye 01/18/2024,10:34 AM  LOS: 1 day

## 2024-01-19 ENCOUNTER — Telehealth: Payer: Self-pay

## 2024-01-19 NOTE — Transitions of Care (Post Inpatient/ED Visit) (Signed)
" ° °  01/19/2024  Name: Edgar Brewer MRN: [id number] DOB: [birth year]  Today's TOC FU Call Status: Today's TOC FU Call Status:: Unsuccessful Call (1st Attempt) Unsuccessful Call (1st Attempt) Date: 01/19/24  Attempted to reach the patient regarding the most recent Inpatient/ED visit.  Follow Up Plan: Additional outreach attempts will be made to reach the patient to complete the Transitions of Care (Post Inpatient/ED visit) call.   Arvin Seip RN, BSN, CCM [employer], Population Health Case Manager Phone: [phone]  "

## 2024-01-22 ENCOUNTER — Telehealth: Payer: Self-pay | Admitting: *Deleted

## 2024-01-22 NOTE — Transitions of Care (Post Inpatient/ED Visit) (Signed)
" ° °  01/22/2024  Name: Edgar Brewer MRN: [id number] DOB: [birth year]  Today's TOC FU Call Status: Today's TOC FU Call Status:: Unsuccessful Call (2nd Attempt) Unsuccessful Call (2nd Attempt) Date: 01/22/24  Attempted to reach the patient regarding the most recent Inpatient/ED visit.  Follow Up Plan: Additional outreach attempts will be made to reach the patient to complete the Transitions of Care (Post Inpatient/ED visit) call.   Mliss Creed [facility], BSN RN Care Manager/ Transition of Care [place]/ [facility] [phone]  "

## 2024-01-23 ENCOUNTER — Telehealth: Payer: Self-pay | Admitting: *Deleted

## 2024-01-23 NOTE — Transitions of Care (Post Inpatient/ED Visit) (Signed)
" ° °  01/23/2024  Name: Edgar Brewer MRN: [id number] DOB: 02-20-1960  Today's TOC FU Call Status: Today's TOC FU Call Status:: Unsuccessful Call (3rd Attempt) Unsuccessful Call (3rd Attempt) Date: 01/23/24  Attempted to reach the patient regarding the most recent Inpatient/ED visit.  Follow Up Plan: No further outreach attempts will be made at this time. We have been unable to contact the patient.  Mliss Creed [facility], BSN RN Care Manager/ Transition of Care [place]/ [facility] [phone]  "

## 2024-01-24 ENCOUNTER — Ambulatory Visit (INDEPENDENT_AMBULATORY_CARE_PROVIDER_SITE_OTHER): Payer: Medicare (Managed Care) | Admitting: Podiatry

## 2024-01-24 DIAGNOSIS — M86272 Subacute osteomyelitis, left ankle and foot: Secondary | ICD-10-CM

## 2024-01-24 NOTE — Progress Notes (Signed)
"  °  Subjective:  Patient ID: Edgar Brewer, male    DOB: 1961-01-09,  MRN: [id number]  Chief Complaint  Patient presents with   Subacute osteomyelitis of left foot     Subacute osteomyelitis of left foot     DOS: 10/23/23 Procedure: 1.  Transmetatarsal amputation, left foot   63 y.o. male seen for post op check.  Patient presents for follow-up of transmetatarsal amputation done by Dr. Malvin he is here to make sure that nothing is infected.  He denies any other acute complaints.  No nausea fever chills vomiting.  Review of Systems: Negative except as noted in the HPI. Denies N/V/F/Ch.   Objective:   Constitutional Well developed. Well nourished.  Vascular Foot warm and well perfused. Capillary refill normal to all digits.   No calf pain with palpation  Neurologic Normal speech. Oriented to person, place, and time. Epicritic sensation diminished to left foot  Dermatologic Left foot amputation site has some superficial dehiscence especially laterally where there is necrotic tissue overlying healthy granular tissue upon debridement.  The wound probes down to subcutaneous fat tissue there is no bone exposed.  No evidence of infection just slow to heal wound with some necrotic and fibrotic tissues present in the wound bed  Overall the amputation site appears improved somewhat at this visit from prior with wound care that has been ongoing    Hearing status Pain   Orthopedic: S/p L foot TMA     Assessment:   No diagnosis found.   Osteomyelitis and gangrene left foot s/p TMA Right foot ulceration dorsal hallux to subcutaneous fat tissue layer Plan:  Patient was evaluated and treated and all questions answered.  10 weeks s/p TMA left foot -Progressing with some dehiscence to fat layer noted laterally and medially along the amputation site on the left foot.  Appears to be overall healing well though and does bleed well with debridement and no evidence of infection.  -She no  clinical signs of infection noted at this time.  I recommended doing Betadine wet-to-dry dressing as there is some skin maceration around the incision site. - Encouraged patient to follow-up with Dr. Newton for further management.   "

## 2024-01-30 ENCOUNTER — Telehealth: Payer: Self-pay | Admitting: Family Medicine

## 2024-01-30 ENCOUNTER — Encounter: Payer: Medicare (Managed Care) | Admitting: Podiatry

## 2024-01-30 NOTE — Telephone Encounter (Signed)
 Noted.    Copied from CRM [id number]. Topic: General - Other >> Jan 30, 2024 11:27 AM Robinson H wrote: Reason for CRM: Fidela calling to report patient declined nursing start of care for nursing and physical therapy, mom states she has been the one doing the wound care and patient states he's strong enough and doesn't need physical therapy.  Fidela Dux Nurse [phone]

## 2024-02-01 ENCOUNTER — Encounter (HOSPITAL_COMMUNITY): Payer: Self-pay | Admitting: Internal Medicine

## 2024-02-01 ENCOUNTER — Inpatient Hospital Stay (HOSPITAL_COMMUNITY): Payer: Medicare (Managed Care)

## 2024-02-01 ENCOUNTER — Ambulatory Visit (INDEPENDENT_AMBULATORY_CARE_PROVIDER_SITE_OTHER): Payer: Medicare (Managed Care) | Admitting: Podiatry

## 2024-02-01 ENCOUNTER — Encounter (HOSPITAL_COMMUNITY): Payer: Self-pay

## 2024-02-01 ENCOUNTER — Inpatient Hospital Stay: Payer: Medicare (Managed Care) | Admitting: Family Medicine

## 2024-02-01 ENCOUNTER — Inpatient Hospital Stay (HOSPITAL_COMMUNITY)
Admission: AD | Admit: 2024-02-01 | Discharge: 2024-02-03 | DRG: 474 | Disposition: A | Payer: Medicare (Managed Care) | Source: Ambulatory Visit | Attending: Internal Medicine | Admitting: Internal Medicine

## 2024-02-01 ENCOUNTER — Other Ambulatory Visit: Payer: Self-pay

## 2024-02-01 ENCOUNTER — Ambulatory Visit (INDEPENDENT_AMBULATORY_CARE_PROVIDER_SITE_OTHER): Payer: Medicare (Managed Care)

## 2024-02-01 VITALS — BP 126/53 | HR 86 | Temp 99.1°F

## 2024-02-01 DIAGNOSIS — Z87891 Personal history of nicotine dependence: Secondary | ICD-10-CM

## 2024-02-01 DIAGNOSIS — L97519 Non-pressure chronic ulcer of other part of right foot with unspecified severity: Secondary | ICD-10-CM | POA: Diagnosis present

## 2024-02-01 DIAGNOSIS — I739 Peripheral vascular disease, unspecified: Secondary | ICD-10-CM

## 2024-02-01 DIAGNOSIS — E1122 Type 2 diabetes mellitus with diabetic chronic kidney disease: Secondary | ICD-10-CM | POA: Diagnosis present

## 2024-02-01 DIAGNOSIS — Z822 Family history of deafness and hearing loss: Secondary | ICD-10-CM

## 2024-02-01 DIAGNOSIS — Z8261 Family history of arthritis: Secondary | ICD-10-CM

## 2024-02-01 DIAGNOSIS — I2510 Atherosclerotic heart disease of native coronary artery without angina pectoris: Secondary | ICD-10-CM | POA: Diagnosis present

## 2024-02-01 DIAGNOSIS — Z8042 Family history of malignant neoplasm of prostate: Secondary | ICD-10-CM

## 2024-02-01 DIAGNOSIS — Z8249 Family history of ischemic heart disease and other diseases of the circulatory system: Secondary | ICD-10-CM | POA: Diagnosis not present

## 2024-02-01 DIAGNOSIS — Y835 Amputation of limb(s) as the cause of abnormal reaction of the patient, or of later complication, without mention of misadventure at the time of the procedure: Secondary | ICD-10-CM | POA: Diagnosis present

## 2024-02-01 DIAGNOSIS — E1140 Type 2 diabetes mellitus with diabetic neuropathy, unspecified: Principal | ICD-10-CM | POA: Diagnosis present

## 2024-02-01 DIAGNOSIS — M86171 Other acute osteomyelitis, right ankle and foot: Secondary | ICD-10-CM | POA: Diagnosis not present

## 2024-02-01 DIAGNOSIS — Z83438 Family history of other disorder of lipoprotein metabolism and other lipidemia: Secondary | ICD-10-CM

## 2024-02-01 DIAGNOSIS — Z992 Dependence on renal dialysis: Secondary | ICD-10-CM

## 2024-02-01 DIAGNOSIS — Z79899 Other long term (current) drug therapy: Secondary | ICD-10-CM

## 2024-02-01 DIAGNOSIS — N2581 Secondary hyperparathyroidism of renal origin: Secondary | ICD-10-CM | POA: Diagnosis present

## 2024-02-01 DIAGNOSIS — Z7982 Long term (current) use of aspirin: Secondary | ICD-10-CM | POA: Diagnosis not present

## 2024-02-01 DIAGNOSIS — K219 Gastro-esophageal reflux disease without esophagitis: Secondary | ICD-10-CM | POA: Diagnosis present

## 2024-02-01 DIAGNOSIS — E1142 Type 2 diabetes mellitus with diabetic polyneuropathy: Secondary | ICD-10-CM | POA: Diagnosis present

## 2024-02-01 DIAGNOSIS — Z9889 Other specified postprocedural states: Secondary | ICD-10-CM

## 2024-02-01 DIAGNOSIS — D631 Anemia in chronic kidney disease: Secondary | ICD-10-CM | POA: Diagnosis present

## 2024-02-01 DIAGNOSIS — L97529 Non-pressure chronic ulcer of other part of left foot with unspecified severity: Secondary | ICD-10-CM | POA: Diagnosis present

## 2024-02-01 DIAGNOSIS — N186 End stage renal disease: Secondary | ICD-10-CM | POA: Diagnosis present

## 2024-02-01 DIAGNOSIS — Z801 Family history of malignant neoplasm of trachea, bronchus and lung: Secondary | ICD-10-CM

## 2024-02-01 DIAGNOSIS — I132 Hypertensive heart and chronic kidney disease with heart failure and with stage 5 chronic kidney disease, or end stage renal disease: Secondary | ICD-10-CM | POA: Diagnosis present

## 2024-02-01 DIAGNOSIS — E785 Hyperlipidemia, unspecified: Secondary | ICD-10-CM | POA: Diagnosis present

## 2024-02-01 DIAGNOSIS — I110 Hypertensive heart disease with heart failure: Secondary | ICD-10-CM | POA: Diagnosis not present

## 2024-02-01 DIAGNOSIS — S81802A Unspecified open wound, left lower leg, initial encounter: Secondary | ICD-10-CM | POA: Diagnosis present

## 2024-02-01 DIAGNOSIS — E1152 Type 2 diabetes mellitus with diabetic peripheral angiopathy with gangrene: Secondary | ICD-10-CM | POA: Diagnosis present

## 2024-02-01 DIAGNOSIS — I5032 Chronic diastolic (congestive) heart failure: Secondary | ICD-10-CM | POA: Diagnosis not present

## 2024-02-01 DIAGNOSIS — I5042 Chronic combined systolic (congestive) and diastolic (congestive) heart failure: Secondary | ICD-10-CM | POA: Diagnosis present

## 2024-02-01 DIAGNOSIS — M869 Osteomyelitis, unspecified: Secondary | ICD-10-CM | POA: Diagnosis present

## 2024-02-01 DIAGNOSIS — E669 Obesity, unspecified: Secondary | ICD-10-CM | POA: Diagnosis present

## 2024-02-01 DIAGNOSIS — Z794 Long term (current) use of insulin: Secondary | ICD-10-CM | POA: Diagnosis not present

## 2024-02-01 DIAGNOSIS — M86272 Subacute osteomyelitis, left ankle and foot: Secondary | ICD-10-CM | POA: Diagnosis not present

## 2024-02-01 DIAGNOSIS — M86172 Other acute osteomyelitis, left ankle and foot: Secondary | ICD-10-CM | POA: Diagnosis present

## 2024-02-01 DIAGNOSIS — E1169 Type 2 diabetes mellitus with other specified complication: Secondary | ICD-10-CM | POA: Diagnosis present

## 2024-02-01 DIAGNOSIS — Z8673 Personal history of transient ischemic attack (TIA), and cerebral infarction without residual deficits: Secondary | ICD-10-CM

## 2024-02-01 DIAGNOSIS — T8781 Dehiscence of amputation stump: Secondary | ICD-10-CM | POA: Diagnosis present

## 2024-02-01 DIAGNOSIS — E11319 Type 2 diabetes mellitus with unspecified diabetic retinopathy without macular edema: Secondary | ICD-10-CM | POA: Diagnosis present

## 2024-02-01 DIAGNOSIS — Z885 Allergy status to narcotic agent status: Secondary | ICD-10-CM

## 2024-02-01 DIAGNOSIS — I272 Pulmonary hypertension, unspecified: Secondary | ICD-10-CM | POA: Diagnosis present

## 2024-02-01 DIAGNOSIS — M86472 Chronic osteomyelitis with draining sinus, left ankle and foot: Secondary | ICD-10-CM | POA: Diagnosis not present

## 2024-02-01 DIAGNOSIS — Z89422 Acquired absence of other left toe(s): Secondary | ICD-10-CM

## 2024-02-01 DIAGNOSIS — Z7902 Long term (current) use of antithrombotics/antiplatelets: Secondary | ICD-10-CM

## 2024-02-01 DIAGNOSIS — E11621 Type 2 diabetes mellitus with foot ulcer: Secondary | ICD-10-CM | POA: Diagnosis present

## 2024-02-01 LAB — CBC
HCT: 32.9 % — ABNORMAL LOW (ref 39.0–52.0)
Hemoglobin: 10.9 g/dL — ABNORMAL LOW (ref 13.0–17.0)
MCH: 30.4 pg (ref 26.0–34.0)
MCHC: 33.1 g/dL (ref 30.0–36.0)
MCV: 91.6 fL (ref 80.0–100.0)
Platelets: 324 K/uL (ref 150–400)
RBC: 3.59 MIL/uL — ABNORMAL LOW (ref 4.22–5.81)
RDW: 14.4 % (ref 11.5–15.5)
WBC: 6.9 K/uL (ref 4.0–10.5)
nRBC: 0 % (ref 0.0–0.2)

## 2024-02-01 LAB — RENAL FUNCTION PANEL
Albumin: 3.7 g/dL (ref 3.5–5.0)
Anion gap: 11 (ref 5–15)
BUN: 31 mg/dL — ABNORMAL HIGH (ref 8–23)
CO2: 31 mmol/L (ref 22–32)
Calcium: 10.2 mg/dL (ref 8.9–10.3)
Chloride: 93 mmol/L — ABNORMAL LOW (ref 98–111)
Creatinine, Ser: 8.06 mg/dL — ABNORMAL HIGH (ref 0.61–1.24)
GFR, Estimated: 7 mL/min — ABNORMAL LOW
Glucose, Bld: 159 mg/dL — ABNORMAL HIGH (ref 70–99)
Phosphorus: 5 mg/dL — ABNORMAL HIGH (ref 2.5–4.6)
Potassium: 4.5 mmol/L (ref 3.5–5.1)
Sodium: 136 mmol/L (ref 135–145)

## 2024-02-01 LAB — GLUCOSE, CAPILLARY: Glucose-Capillary: 155 mg/dL — ABNORMAL HIGH (ref 70–99)

## 2024-02-01 LAB — ABO/RH: ABO/RH(D): O NEG

## 2024-02-01 LAB — TYPE AND SCREEN
ABO/RH(D): O NEG
Antibody Screen: NEGATIVE

## 2024-02-01 MED ORDER — SEVELAMER CARBONATE 800 MG PO TABS
1600.0000 mg | ORAL_TABLET | Freq: Three times a day (TID) | ORAL | Status: DC
  Administered 2024-02-01 – 2024-02-03 (×4): 1600 mg via ORAL
  Filled 2024-02-01 (×4): qty 2

## 2024-02-01 MED ORDER — ACETAMINOPHEN 650 MG RE SUPP: 650.0000 mg | Freq: Four times a day (QID) | RECTAL | Status: DC | PRN

## 2024-02-01 MED ORDER — INSULIN ASPART 100 UNIT/ML IJ SOLN
0.0000 [IU] | Freq: Three times a day (TID) | INTRAMUSCULAR | Status: DC
  Administered 2024-02-03: 2 [IU] via SUBCUTANEOUS
  Filled 2024-02-01: qty 2

## 2024-02-01 MED ORDER — INSULIN GLARGINE-YFGN 100 UNIT/ML ~~LOC~~ SOLN
15.0000 [IU] | Freq: Every day | SUBCUTANEOUS | Status: DC
  Administered 2024-02-03: 15 [IU] via SUBCUTANEOUS
  Filled 2024-02-01 (×2): qty 0.15

## 2024-02-01 MED ORDER — ROSUVASTATIN CALCIUM 5 MG PO TABS
10.0000 mg | ORAL_TABLET | Freq: Every day | ORAL | Status: DC
  Administered 2024-02-01 – 2024-02-03 (×2): 10 mg via ORAL
  Filled 2024-02-01 (×2): qty 2

## 2024-02-01 MED ORDER — RENA-VITE PO TABS
1.0000 | ORAL_TABLET | Freq: Every day | ORAL | Status: DC
  Administered 2024-02-01 – 2024-02-03 (×2): 1 via ORAL
  Filled 2024-02-01 (×2): qty 1

## 2024-02-01 MED ORDER — ASPIRIN 81 MG PO TBEC
81.0000 mg | DELAYED_RELEASE_TABLET | Freq: Every day | ORAL | Status: DC
  Administered 2024-02-03: 81 mg via ORAL
  Filled 2024-02-01: qty 1

## 2024-02-01 MED ORDER — HYDRALAZINE HCL 20 MG/ML IJ SOLN: 5.0000 mg | INTRAMUSCULAR | Status: DC | PRN

## 2024-02-01 MED ORDER — ACETAMINOPHEN 325 MG PO TABS
650.0000 mg | ORAL_TABLET | Freq: Four times a day (QID) | ORAL | Status: DC | PRN
  Administered 2024-02-02 – 2024-02-03 (×3): 650 mg via ORAL
  Filled 2024-02-01 (×3): qty 2

## 2024-02-01 MED ORDER — CINACALCET HCL 30 MG PO TABS: 60.0000 mg | ORAL_TABLET | ORAL | Status: DC

## 2024-02-01 MED ORDER — SODIUM CHLORIDE 0.9 % IV SOLN
1.0000 g | INTRAVENOUS | Status: DC
  Administered 2024-02-01: 1 g via INTRAVENOUS
  Filled 2024-02-01 (×2): qty 1

## 2024-02-01 MED ORDER — CLOPIDOGREL BISULFATE 75 MG PO TABS
75.0000 mg | ORAL_TABLET | Freq: Every day | ORAL | Status: DC
  Administered 2024-02-03: 75 mg via ORAL
  Filled 2024-02-01: qty 1

## 2024-02-01 MED ORDER — SEVELAMER CARBONATE 800 MG PO TABS: 1600.0000 mg | ORAL_TABLET | Freq: Three times a day (TID) | ORAL | Status: DC

## 2024-02-01 MED ORDER — CINACALCET HCL 30 MG PO TABS
60.0000 mg | ORAL_TABLET | ORAL | Status: DC
  Administered 2024-02-01 – 2024-02-03 (×2): 60 mg via ORAL
  Filled 2024-02-01 (×2): qty 2

## 2024-02-01 MED ORDER — PANTOPRAZOLE SODIUM 40 MG PO TBEC
40.0000 mg | DELAYED_RELEASE_TABLET | Freq: Every day | ORAL | Status: DC
  Administered 2024-02-01 – 2024-02-03 (×2): 40 mg via ORAL
  Filled 2024-02-01 (×2): qty 1

## 2024-02-01 MED ORDER — POLYETHYLENE GLYCOL 3350 17 G PO PACK: 17.0000 g | PACK | Freq: Every day | ORAL | Status: DC | PRN

## 2024-02-01 MED ORDER — ALBUTEROL SULFATE (2.5 MG/3ML) 0.083% IN NEBU: 2.5000 mg | INHALATION_SOLUTION | RESPIRATORY_TRACT | Status: DC | PRN

## 2024-02-01 MED ORDER — DOCUSATE SODIUM 100 MG PO CAPS: 100.0000 mg | ORAL_CAPSULE | Freq: Two times a day (BID) | ORAL | Status: DC | PRN

## 2024-02-01 MED ORDER — VANCOMYCIN HCL 2000 MG/400ML IV SOLN
2000.0000 mg | Freq: Once | INTRAVENOUS | Status: AC
  Administered 2024-02-01: 2000 mg via INTRAVENOUS
  Filled 2024-02-01: qty 400

## 2024-02-01 MED ORDER — GABAPENTIN 100 MG PO CAPS
100.0000 mg | ORAL_CAPSULE | Freq: Three times a day (TID) | ORAL | Status: DC
  Administered 2024-02-01 – 2024-02-03 (×5): 100 mg via ORAL
  Filled 2024-02-01 (×5): qty 1

## 2024-02-01 MED ORDER — ASPIRIN 81 MG PO TBEC: 81.0000 mg | DELAYED_RELEASE_TABLET | Freq: Every day | ORAL | Status: DC

## 2024-02-01 NOTE — Anesthesia Preprocedure Evaluation (Addendum)
"                                    Anesthesia Evaluation  Patient identified by MRN, date of birth, ID band Patient awake    Reviewed: Allergy & Precautions, NPO status , Patient's Chart, lab work & pertinent test results  History of Anesthesia Complications Negative for: history of anesthetic complications  Airway Mallampati: III  TM Distance: >3 FB Neck ROM: Full    Dental  (+) Dental Advisory Given, Poor Dentition, Chipped, Missing,    Pulmonary former smoker   Pulmonary exam normal        Cardiovascular hypertension, pulmonary hypertension+ Peripheral Vascular Disease and +CHF  Normal cardiovascular exam+ dysrhythmias Atrial Fibrillation + Valvular Problems/Murmurs AS    '25 TTE - EF 60 to 65%. There is the interventricular septum is flattened in diastole ('D' shaped left ventricle), consistent with right ventricular volume overload. There is a 1.1 x 1.5 calcified mobile echodensity attached to the base and medial aspect (A1) of the anterior MV leaflet. Mild to moderate aortic valve stenosis. Aortic valve area, by VTI measures 1.55 cm. Aortic valve mean gradient measures 15.0 mmHg. Aortic valve Vmax measures 2.83 m/s.      Neuro/Psych  Neuromuscular disease CVA  negative psych ROS   GI/Hepatic Neg liver ROS,GERD  Medicated and Controlled,,  Endo/Other  diabetes, Type 2, Insulin  Dependent   Obesity K 5.5   Renal/GU ESRF and DialysisRenal disease     Musculoskeletal  (+) Arthritis ,    Abdominal   Peds  Hematology  (+) Blood dyscrasia, anemia   Anesthesia Other Findings   Reproductive/Obstetrics                              Anesthesia Physical Anesthesia Plan  ASA: 3  Anesthesia Plan: General   Post-op Pain Management: Tylenol  PO (pre-op )* and Celebrex  PO (pre-op )*   Induction: Intravenous  PONV Risk Score and Plan: 1 and Propofol  infusion and Treatment may vary due to age or medical condition  Airway  Management Planned: LMA  Additional Equipment: None  Intra-op Plan:   Post-operative Plan:   Informed Consent: I have reviewed the patients History and Physical, chart, labs and discussed the procedure including the risks, benefits and alternatives for the proposed anesthesia with the patient or authorized representative who has indicated his/her understanding and acceptance.       Plan Discussed with: CRNA and Anesthesiologist  Anesthesia Plan Comments: ( )         Anesthesia Quick Evaluation  "

## 2024-02-01 NOTE — H&P (Signed)
 "                                                                                                          TRH H&P   Patient Demographics:    Edgar Brewer, is a 64 y.o. male  MRN: [id number]   DOB - 08/07/1960  Admit Date - 02/01/2024  Outpatient Primary MD for the patient is Katrinka Garnette KIDD, MD  Referring MD/NP/PA: sent from podiatry office.   No chief complaint on file.     HPI:    Edgar Brewer  is a 64 y.o. male, PMH of ESRD on HD MWF, GERD, CAD, PAD, HTN, DM2, chronic HFpEF, obesity, anemia, history of recent hospitalization and September for foot infection and culture-negative endocarditis, and hospitalization last December for respiratory failure with influenza A. - Patient presents for routine wound check to his podiatrist today, left TMA wound looked concerning with some discharge abnormal x-ray and wound dehiscence with concern of acute osteomyelitis in the podiatrist office, so direct admission requested in anticipation for surgery in a.m. for TMA revision and wound debridement, other workup including basic labs CBC, renal panel, blood cultures, chest x-ray, EKG, MRI of the left foot still pending.    Review of systems:      A full 10 point Review of Systems was done, except as stated above, all other Review of Systems were negative.   With Past History of the following :    Past Medical History:  Diagnosis Date   Acute on chronic combined systolic and diastolic CHF, NYHA class 1 (HCC) 01/13/2015   Allergy    Arthritis    Chronic kidney disease    MWF [facility].   Diabetes mellitus type 2 with complications (HCC)    Previous insulin , non-insulin  requiring noted 09/2018.  Complications include retinopathy, peripheral neuropathy.   GERD (gastroesophageal reflux disease)    at times   Head injury with loss of consciousness (HCC)    History of kidney stones    Hypertension    Neuromuscular disorder (HCC)    Neuropathy    Pulmonary hypertension (HCC)    PA peak  pressure 41 mmHg 03/04/16 echo   Stroke [facility])    no residual weakness   Varicose veins of lower extremities with complications 12/24/2014      Past Surgical History:  Procedure Laterality Date   ABDOMINAL AORTOGRAM W/LOWER EXTREMITY N/A 12/01/2023   Procedure: ABDOMINAL AORTOGRAM W/LOWER EXTREMITY;  Surgeon: Magda Debby SAILOR, MD;  Location: [facility] INVASIVE CV LAB;  Service: Cardiovascular;  Laterality: N/A;   AMPUTATION TOE Left 09/21/2023   Procedure: AMPUTATION, TOE;  Surgeon: Malvin Marsa FALCON, DPM;  Location: [facility] OR;  Service: Orthopedics/Podiatry;  Laterality: Left;  Left 2nd toe amputation   AV FISTULA PLACEMENT Left 06/01/2016   Procedure: ARTERIOVENOUS (AV) FISTULA CREATION;  Surgeon: Oris Krystal FALCON, MD;  [facility]: [facility] OR;  Service: Vascular;  Laterality: Left;   AV FISTULA PLACEMENT Left 08/10/2017   Procedure: BRACHIOCEPHALIC ARTERIOVENOUS (AV) FISTULA CREATION LEFT UPPER EXTREMITY;  Surgeon: Eliza Lonni RAMAN, MD;  Location: [facility] OR;  Service: Vascular;  Laterality: Left;   CHOLECYSTECTOMY N/A 10/04/2018   Procedure: LAPAROSCOPIC CHOLECYSTECTOMY;  Surgeon: Kinsinger, Herlene Righter, MD;  Location: [facility] OR;  Service: General;  Laterality: N/A;   COLONOSCOPY W/ POLYPECTOMY  03/2016   For evaluation of iron deficiency anemia.  Dr. Shila.  10 polyps, largest 20 mm.  Pathology: tubular adenomas with no high-grade dysplasia, hyperplastic.  None bleeding internal hemorrhoids.  Pandiverticulosis.   CYSTOSCOPY WITH RETROGRADE PYELOGRAM, URETEROSCOPY AND STENT PLACEMENT Bilateral 02/25/2021   Procedure: CYSTOSCOPY WITH BILATERAL  RETROGRADE PYELOGRAM, RIGHT DIAGNOSTIC URETEROSCOPY,  BLADDER BIOPSY WITH FULGERATION;  Surgeon: Alvaro Hummer, MD;  Location: [facility] ORS;  Service: Urology;  Laterality: Bilateral;   ENDOVENOUS ABLATION SAPHENOUS VEIN W/ LASER Left 07-23-2015   endovenous laser ablation left greater saphenous vein by Krystal Doing MD   ENDOVENOUS ABLATION SAPHENOUS VEIN W/ LASER Right 10/15/2015    EVLA right greater saphenous vein by Krystal Doing MD   ESOPHAGOGASTRODUODENOSCOPY  03/2016   For evaluation of IDA.  Dr. Shila.  Duodenal erythema, pathology benign mucosa, no villous atrophy.   GALLBLADDER SURGERY  10/2018   HOLMIUM LASER APPLICATION Right 02/25/2021   Procedure: HOLMIUM LASER OF TUMOR;  Surgeon: Alvaro Hummer, MD;  Location: [facility] ORS;  Service: Urology;  Laterality: Right;   IR RADIOLOGIST EVAL & MGMT  11/29/2018   LOWER EXTREMITY ANGIOGRAPHY N/A 09/20/2023   Procedure: Lower Extremity Angiography;  Surgeon: Magda Debby SAILOR, MD;  Location: [facility] INVASIVE CV LAB;  Service: Cardiovascular;  Laterality: N/A;   LOWER EXTREMITY ANGIOGRAPHY N/A 12/01/2023   Procedure: Lower Extremity Angiography;  Surgeon: Magda Debby SAILOR, MD;  Location: [facility] INVASIVE CV LAB;  Service: Cardiovascular;  Laterality: N/A;   LOWER EXTREMITY INTERVENTION Left 09/20/2023   Procedure: LOWER EXTREMITY INTERVENTION;  Surgeon: Magda Debby SAILOR, MD;  Location: [facility] INV[facility] CV LAB;  Service: Cardiovascular;  Laterality: Left;   LOWER EXTREMITY INTERVENTION N/A 12/01/2023   Procedure: LOWER EXTREMITY INTERVENTION;  Surgeon: Magda Debby SAILOR, MD;  Location: [facility] INVASIVE CV LAB;  Service: Cardiovascular;  Laterality: N/A;   TEE WITHOUT CARDIOVERSION N/A 11/19/2018   Procedure: TRANSESOPHAGEAL ECHOCARDIOGRAM (TEE);  Surgeon: Alveta Aleene PARAS, MD;  Location: [facility] ENDOSCOPY;  Service: Cardiovascular;  Laterality: N/A;   TRANSESOPHAGEAL ECHOCARDIOGRAM (CATH LAB) N/A 11/01/2023   Procedure: TRANSESOPHAGEAL ECHOCARDIOGRAM;  Surgeon: Floretta Mallard, MD;  Location: [facility] INVASIVE CV LAB;  Service: Cardiovascular;  Laterality: N/A;   TRANSMETATARSAL AMPUTATION Left 10/23/2023   Procedure: AMPUTATION, FOOT, TRANSMETATARSAL;  Surgeon: Malvin Marsa FALCON, DPM;  Location: [facility] OR;  Service: Orthopedics/Podiatry;  Laterality: Left;      Social History:     Social History   Tobacco Use   Smoking status: Former    Current  packs/day: 0.00    Average packs/day: 1 pack/day for 45.0 years (45.0 ttl pk-yrs)    Types: Cigarettes    Start date: 01/12/1970    Quit date: 01/13/2015    Years since quitting: 9.0   Smokeless tobacco: Never  Substance Use Topics   Alcohol use: Yes    Alcohol/week: 1.0 standard drink of alcohol    Types: 1 Glasses of wine per week    Comment: rare occassion         Family History  Problem Relation Age of Onset   Arthritis Mother    Hearing loss Father    Prostate cancer Father    Hyperlipidemia Maternal Grandmother    Heart disease Maternal Grandmother    Lung cancer Maternal Grandfather  Home Medications:   Prior to Admission medications  Medication Sig Start Date End Date Taking? Authorizing Provider  aspirin  EC 81 MG tablet Take 81 mg by mouth daily. Swallow whole.    [provider]  b complex-vitamin c -folic acid  (NEPHRO-VITE) 0.8 MG TABS tablet Take 1 tablet by mouth in the morning.    [provider]  cinacalcet  (SENSIPAR ) 60 MG tablet Take 60 mg by mouth every Tuesday, Thursday, and Saturday at 6 PM. In the evening.    [provider]  clopidogrel  (PLAVIX ) 75 MG tablet Take 1 tablet (75 mg total) by mouth daily. 12/01/23 11/30/24  Magda Debby SAILOR, MD  diphenhydramine -acetaminophen  (TYLENOL  PM) 25-500 MG TABS tablet Take 1 tablet by mouth at bedtime as needed (Sleep/Pain).    [provider]  Docusate Calcium  (STOOL SOFTENER PO) Take 1-2 tablets by mouth 2 (two) times daily as needed (Constipation).    [provider]  gabapentin  (NEURONTIN ) 100 MG capsule TAKE 1 CAPSULE BY MOUTH THREE TIMES DAILY 11/13/23   Katrinka Garnette KIDD, MD  LANTUS  SOLOSTAR 100 UNIT/ML Solostar Pen Inject 30 Units into the skin daily. 01/18/24   Tobie Yetta HERO, MD  omeprazole  [facility]) 20 MG capsule Take 1 capsule by mouth once daily 11/13/23   Katrinka Garnette KIDD, MD  oseltamivir  (TAMIFLU ) 30 MG capsule Take 30mg  today, then take 30mg  3x/wk after  dialysis x 5 doses 01/15/24   Jodie Lavern CROME, MD  rosuvastatin  (CRESTOR ) 10 MG tablet Take 1 tablet by mouth once daily 11/13/23   Katrinka Garnette KIDD, MD  sevelamer  carbonate (RENVELA ) 800 MG tablet Take 2 tablets (1,600 mg total) by mouth 3 (three) times daily with meals. 10/22/18   Cheryle Page, MD     Allergies:    Allergies[1]   Physical Exam:   Vitals  Blood pressure (!) 99/59, pulse (!) 104, temperature 99.2 F (37.3 C), temperature source Oral, resp. rate 18, SpO2 94%.   1. General Well Developed male, laying in bed, no apparent distress  2. Normal affect and insight, Not Suicidal or Homicidal, Awake Alert, Oriented X 3.  3. No F.N deficits, ALL C.Nerves Intact, Strength 5/5 all 4 extremities, Sensation intact all 4 extremities, Plantars down going.  4. Ears and Eyes appear Normal, Conjunctivae clear, PERRLA. Moist Oral Mucosa.  5. Supple Neck, No JVD, No cervical lymphadenopathy appriciated, No Carotid Bruits.  6. Symmetrical Chest wall movement, Good air movement bilaterally, CTAB.  7. RRR, No Gallops, Rubs or Murmurs, No Parasternal Heave.  8. Positive Bowel Sounds, Abdomen Soft, No tenderness, No organomegaly appriciated,No rebound -guarding or rigidity.  9.  Good muscle tone, left foot status post TMA bandaged, right great toe ulceration, appears chronic, not infected no discharge or foul-smelling odor         Data Review:    CBC No results for input(s): WBC, HGB, HCT, PLT, MCV, MCH, MCHC, RDW, LYMPHSABS, MONOABS, EOSABS, BASOSABS, BANDABS in the last 168 hours.  Invalid input(s): NEUTRABS, BANDSABD ------------------------------------------------------------------------------------------------------------------  Chemistries  No results for input(s): NA, K, CL, CO2, GLUCOSE, BUN, CREATININE, CALCIUM , MG, AST, ALT, ALKPHOS, BILITOT in the last 168 hours.  Invalid input(s):  GFRCGP ------------------------------------------------------------------------------------------------------------------ CrCl cannot be calculated (Unknown ideal weight.). ------------------------------------------------------------------------------------------------------------------ No results for input(s): TSH, T4TOTAL, T3FREE, THYROIDAB in the last 72 hours.  Invalid input(s): FREET3  Coagulation profile No results for input(s): INR, PROTIME in the last 168 hours. ------------------------------------------------------------------------------------------------------------------- No results for input(s): DDIMER in the last 72 hours. -------------------------------------------------------------------------------------------------------------------  Cardiac Enzymes No results for  input(s): CKMB, TROPONINI, MYOGLOBIN in the last 168 hours.  Invalid input(s): CK ------------------------------------------------------------------------------------------------------------------    Component Value Date/Time   BNP 387.7 (H) 10/19/2023 1449     ---------------------------------------------------------------------------------------------------------------  Urinalysis    Component Value Date/Time   COLORURINE AMBER (A) 11/18/2018 0807   APPEARANCEUR CLEAR 11/18/2018 0807   LABSPEC 1.015 11/18/2018 0807   PHURINE 6.0 11/18/2018 0807   GLUCOSEU >=500 (A) 11/18/2018 0807   HGBUR NEGATIVE 11/18/2018 0807   BILIRUBINUR SMALL (A) 11/18/2018 0807   KETONESUR 5 (A) 11/18/2018 0807   PROTEINUR >=300 (A) 11/18/2018 0807   NITRITE NEGATIVE 11/18/2018 0807   LEUKOCYTESUR NEGATIVE 11/18/2018 0807    ----------------------------------------------------------------------------------------------------------------   Imaging Results:    DG Foot Complete Left Result Date: 02/01/2024 Please see detailed radiograph report in office note.   EKG is pending   Assessment  & Plan:    Principal Problem:   Acute osteomyelitis of right foot (HCC) Active Problems:   Obesity   Anemia of chronic kidney failure   PAD (peripheral artery disease)   Chronic diastolic CHF (congestive heart failure) (HCC)   Type 2 diabetes mellitus with diabetic neuropathy, unspecified (HCC)   History of stroke   GERD (gastroesophageal reflux disease)   Wound of left leg    Left foot acute osteomyelitis, with wound dehiscence. -Podiatry input greatly appreciated, patient s/p TMA of the left foot, he was treated with IV vancomycin  and ceftazidime  in the past for culture-negative endocarditis, with end of therapy day 12/13/2023 . - X-ray at podiatry office concerning for left foot TMA 1st and 5th metatarsal stump osteomyelitis, wound dehiscence and infection . - Plan for surgical intervention tomorrow and revision transmetatarsal amputation with further resection of metatarsals 1 through 5 and debridement of the ulceration. - Obtain left foot MRI without contrast per podiatry recommendation . - Will start on IV vancomycin  and ceftazidime  after blood cultures and labs were sent . -Hold on pharmacologic DVT prophylaxis for podiatry request, can be resumed after surgery once cleared by podiatry.   ESRD -patient on HD Monday Wednesday Friday, he finished his HD yesterday, resume home meds. - discussed with renal, they will dialyze tomorrow after surgery  HTN. Blood pressure is acceptable, not on any medications, will add as needed hydralazine    Type 2 diabetes mellitus. -On Lantus  30 units daily, will start on 15 units from tomorrow as he is n.p.o. for procedure . - Will add insulin  sliding scale without nighttime coverage as he is n.p.o. . - Will check A1c    History of CVA and PAD. Hyperlipidemia On Plavix  and aspirin .  Okay to resume per podiatry Continue with home statin   Anemia of chronic kidney disease. Labs are pending, but Procrit and IV iron per renal   Patient  recently treated for influenza 2 weeks ago, currently off quarantine.,  I will check chest x-ray and EKG for preop evaluation  DVT Prophylaxis  SCDs, can start [place] heparin  once cleared by podiatry after surgery        AM Labs Ordered, also please review Full Orders  Family Communication: Admission, patients condition and plan of care including tests being ordered have been discussed with the patient who indicate understanding and agree with the plan and Code Status.  Code Status full code  Likely DC to PT OT to see  Consults called: Renal consulted, podiatry aware of the patient  Admission status: Inpatient 70 minutes  Time spent in minutes : 70 minutes   Brayton Lye M.D on  02/01/2024 at 4:40 PM   [facility] - Office  [phone]        [1]  Allergies Allergen Reactions   Codeine Anaphylaxis, Hives, Swelling and Other (See Comments)    Swelling all over body and the throat   Clindamycin /Lincomycin Nausea And Vomiting   "

## 2024-02-01 NOTE — Progress Notes (Signed)
 Pharmacy Antibiotic Note  Edgar Brewer is a 64 y.o. male admitted on 02/01/2024 with osteomyelitis.  Pharmacy has been consulted for Vancomycin  and Ceftazidime  dosing.  Plan: Vancomycin  2000 mg now and 1000 mg after each HD (will order when HD schedule inpatient known) Ceftazidime  1g Q24H  Weight: 89.8 kg (197 lb 15.6 oz)  Temp (24hrs), Avg:99.2 F (37.3 C), Min:99.1 F (37.3 C), Max:99.2 F (37.3 C)  No results for input(s): WBC, CREATININE, LATICACIDVEN, VANCOTROUGH, VANCOPEAK, VANCORANDOM, GENTTROUGH, GENTPEAK, GENTRANDOM, TOBRATROUGH, TOBRAPEAK, TOBRARND, AMIKACINPEAK, AMIKACINTROU, AMIKACIN in the last 168 hours.  Estimated Creatinine Clearance: 11.4 mL/min (A) (by C-G formula based on SCr of 7.13 mg/dL (H)).    Allergies[1]  Antimicrobials this admission: Vancomycin  1/8 >>  Ceftazidime  1/8 >>    Thank you for allowing pharmacy to be a part of this patients care.  Edgar Brewer 02/01/2024 6:05 PM     [1]  Allergies Allergen Reactions   Codeine Anaphylaxis, Hives, Swelling and Other (See Comments)    Swelling all over body and the throat   Clindamycin /Lincomycin Nausea And Vomiting

## 2024-02-01 NOTE — Progress Notes (Signed)
 "  Subjective:  Patient ID: Edgar Brewer, male    DOB: [birth year],  MRN: [id number]  Chief Complaint  Patient presents with   Wound Check    f/u Transmetatarsal amputation, left foot. 0 pain, Slight drainage with an odor. A1C 6.3.     DOS: 10/23/23 Procedure: 1.  Transmetatarsal amputation, left foot   64 y.o. male seen for post op check.  Patient presenting s/p left foot transmetatarsal amputation.  Patient is getting IV antibiotics with dialysis.  His mother has been doing dressing changes 3 times weekly.  Using Betadine and dry gauze dressing on the left foot.  No concerns on the right foot.  Concern for new malodor and drainage from the left foot.  Review of Systems: Negative except as noted in the HPI. Denies N/V/F/Ch.   Objective:   Constitutional Well developed. Well nourished.  Vascular Foot warm and well perfused. Capillary refill normal to all digits.   No calf pain with palpation  Neurologic Normal speech. Oriented to person, place, and time. Epicritic sensation diminished to left foot  Dermatologic Left foot amputation site has dehiscence medially and laterally with some necrotic tissue as well as fibrotic tissue and fibropurulent drainage.  There is malodor associated with these wounds.  There is mild erythema at the amputation site.    Right foot great toe has dorsal ulceration with some fibrotic eschar present in the wound bed but no evidence of erythema or drainage.    Orthopedic: S/p L foot TMA   Radiographs: Interval transmetatarsal amputation of the first through fifth digits.  02/01/2024 XR 3 views AP lateral oblique of the left foot: Evidence of osteolysis and erosions at the 1st and 5th metatarsal stump site concern for osteomyelitis chronic  Pathology:  A. FOOT, LEFT, TRANSMETATARSAL AMPUTATION:       Chronic osteomyelitis.       Subcutaneous tissue with congestion.       Skin ulcer and necrosis.       Bone margin is viable without evidence of acute  osteomyelitis or  necrosis.   Micro: MODERATE ENTEROCOCCUS FAECALIS  FEW STENOTROPHOMONAS MALTOPHILIA  FEW STAPHYLOCOCCUS AUREUS   Assessment:   1. Subacute osteomyelitis of left foot (HCC)   2. PAD (peripheral artery disease)   3. Post-operative state      Osteomyelitis and gangrene left foot s/p TMA Right foot ulceration dorsal hallux to subcutaneous fat tissue layer Plan:  Patient was evaluated and treated and all questions answered.   s/p TMA left foot # Chronic osteomyelitis of left foot TMA 1st and 5th metatarsal stump - Concern now for progressive infection on the left foot TMA site which would correspond to his areas of ulceration and dehiscence now with worsening erythema malodor and some drainage - XR taken today demonstrates evidence of osteomyelitis at the 1st and 5th metatarsal possibly the other metatarsals as well -Due to chronic osteomyelitis at the amputation site I recommend direct admission to [facility] which I will arrange.  Plan for will be for surgical intervention tomorrow to include revision transmetatarsal amputation with further resections of metatarsals 1 through 5 debridement of the ulcerations present washout and hopeful closure tomorrow.  Discussed with the patient and he is in agreement with plan -N.p.o. past midnight he is okay to continue on any blood thinners he is currently taking but should hold any new DVT PPx such as Lovenox  while admitted -WB Status: Will be nonweightbearing to the left foot following surgery -Medications/ABX: Likely involve infectious disease  while admitted appreciate their recommendations can be started on broad-spectrum antibiotics upon admission -Dressing: Betadine wet-to-dry dressing - F/u Plan: Patient to follow-up after upcoming admission        [name], DPM [facility]  "

## 2024-02-02 ENCOUNTER — Inpatient Hospital Stay (HOSPITAL_COMMUNITY): Payer: Medicare (Managed Care) | Admitting: Anesthesiology

## 2024-02-02 ENCOUNTER — Encounter (HOSPITAL_COMMUNITY): Payer: Self-pay | Admitting: Internal Medicine

## 2024-02-02 ENCOUNTER — Inpatient Hospital Stay (HOSPITAL_COMMUNITY): Payer: Medicare (Managed Care)

## 2024-02-02 ENCOUNTER — Encounter (HOSPITAL_COMMUNITY): Admission: AD | Disposition: A | Payer: Self-pay | Source: Ambulatory Visit | Attending: Internal Medicine

## 2024-02-02 ENCOUNTER — Inpatient Hospital Stay (HOSPITAL_COMMUNITY): Admission: RE | Admit: 2024-02-02 | Payer: Medicare (Managed Care) | Source: Home / Self Care | Admitting: Podiatry

## 2024-02-02 DIAGNOSIS — M86472 Chronic osteomyelitis with draining sinus, left ankle and foot: Secondary | ICD-10-CM

## 2024-02-02 DIAGNOSIS — I5032 Chronic diastolic (congestive) heart failure: Secondary | ICD-10-CM

## 2024-02-02 DIAGNOSIS — I110 Hypertensive heart disease with heart failure: Secondary | ICD-10-CM

## 2024-02-02 DIAGNOSIS — E1169 Type 2 diabetes mellitus with other specified complication: Secondary | ICD-10-CM

## 2024-02-02 DIAGNOSIS — M869 Osteomyelitis, unspecified: Secondary | ICD-10-CM

## 2024-02-02 DIAGNOSIS — M86171 Other acute osteomyelitis, right ankle and foot: Secondary | ICD-10-CM | POA: Diagnosis not present

## 2024-02-02 HISTORY — PX: TRANSMETATARSAL AMPUTATION: SHX6197

## 2024-02-02 LAB — CBC
HCT: 31.9 % — ABNORMAL LOW (ref 39.0–52.0)
Hemoglobin: 10.4 g/dL — ABNORMAL LOW (ref 13.0–17.0)
MCH: 30 pg (ref 26.0–34.0)
MCHC: 32.6 g/dL (ref 30.0–36.0)
MCV: 91.9 fL (ref 80.0–100.0)
Platelets: 295 K/uL (ref 150–400)
RBC: 3.47 MIL/uL — ABNORMAL LOW (ref 4.22–5.81)
RDW: 14.4 % (ref 11.5–15.5)
WBC: 7.8 K/uL (ref 4.0–10.5)
nRBC: 0 % (ref 0.0–0.2)

## 2024-02-02 LAB — BASIC METABOLIC PANEL WITH GFR
Anion gap: 13 (ref 5–15)
BUN: 36 mg/dL — ABNORMAL HIGH (ref 8–23)
CO2: 29 mmol/L (ref 22–32)
Calcium: 9.9 mg/dL (ref 8.9–10.3)
Chloride: 94 mmol/L — ABNORMAL LOW (ref 98–111)
Creatinine, Ser: 8.79 mg/dL — ABNORMAL HIGH (ref 0.61–1.24)
GFR, Estimated: 6 mL/min — ABNORMAL LOW
Glucose, Bld: 144 mg/dL — ABNORMAL HIGH (ref 70–99)
Potassium: 5 mmol/L (ref 3.5–5.1)
Sodium: 136 mmol/L (ref 135–145)

## 2024-02-02 LAB — GLUCOSE, CAPILLARY
Glucose-Capillary: 138 mg/dL — ABNORMAL HIGH (ref 70–99)
Glucose-Capillary: 138 mg/dL — ABNORMAL HIGH (ref 70–99)
Glucose-Capillary: 127 mg/dL — ABNORMAL HIGH (ref 70–99)
Glucose-Capillary: 131 mg/dL — ABNORMAL HIGH (ref 70–99)
Glucose-Capillary: 135 mg/dL — ABNORMAL HIGH (ref 70–99)

## 2024-02-02 LAB — HEMOGLOBIN A1C
Hgb A1c MFr Bld: 6.1 % — ABNORMAL HIGH (ref 4.8–5.6)
Mean Plasma Glucose: 128.37 mg/dL

## 2024-02-02 LAB — MRSA NEXT GEN BY PCR, NASAL: MRSA by PCR Next Gen: NOT DETECTED

## 2024-02-02 SURGERY — AMPUTATION, FOOT, TRANSMETATARSAL
Anesthesia: General | Site: Toe | Laterality: Left

## 2024-02-02 MED ORDER — ONDANSETRON HCL 4 MG/2ML IJ SOLN
INTRAMUSCULAR | Status: DC | PRN
  Administered 2024-02-02: 4 mg via INTRAVENOUS

## 2024-02-02 MED ORDER — PROPOFOL 10 MG/ML IV BOLUS
INTRAVENOUS | Status: AC
  Filled 2024-02-02: qty 20

## 2024-02-02 MED ORDER — LIDOCAINE HCL (PF) 1 % IJ SOLN
INTRAMUSCULAR | Status: DC | PRN
  Administered 2024-02-02: 5 mL

## 2024-02-02 MED ORDER — CELECOXIB 200 MG PO CAPS: 200.0000 mg | ORAL_CAPSULE | Freq: Once | ORAL | Status: AC

## 2024-02-02 MED ORDER — DOXYCYCLINE HYCLATE 100 MG PO TABS
100.0000 mg | ORAL_TABLET | Freq: Two times a day (BID) | ORAL | Status: DC
  Administered 2024-02-03 (×2): 100 mg via ORAL
  Filled 2024-02-02 (×2): qty 1

## 2024-02-02 MED ORDER — LIDOCAINE 2% (20 MG/ML) 5 ML SYRINGE
INTRAMUSCULAR | Status: DC | PRN
  Administered 2024-02-02: 100 mg via INTRAVENOUS

## 2024-02-02 MED ORDER — INSULIN ASPART 100 UNIT/ML IJ SOLN: 0.0000 [IU] | INTRAMUSCULAR | Status: DC | PRN

## 2024-02-02 MED ORDER — MIDAZOLAM HCL 2 MG/2ML IJ SOLN
INTRAMUSCULAR | Status: AC
  Filled 2024-02-02: qty 2

## 2024-02-02 MED ORDER — ONDANSETRON HCL 4 MG/2ML IJ SOLN: 4.0000 mg | Freq: Once | INTRAMUSCULAR | Status: DC | PRN

## 2024-02-02 MED ORDER — SODIUM CHLORIDE 0.9 % IR SOLN
Status: DC | PRN
  Administered 2024-02-02: 1000 mL

## 2024-02-02 MED ORDER — CHLORHEXIDINE GLUCONATE 0.12 % MT SOLN: 15.0000 mL | Freq: Once | OROMUCOSAL | Status: AC

## 2024-02-02 MED ORDER — FENTANYL CITRATE (PF) 100 MCG/2ML IJ SOLN: 25.0000 ug | INTRAMUSCULAR | Status: DC | PRN

## 2024-02-02 MED ORDER — CELECOXIB 200 MG PO CAPS
ORAL_CAPSULE | ORAL | Status: AC
  Administered 2024-02-02: 200 mg via ORAL
  Filled 2024-02-02: qty 1

## 2024-02-02 MED ORDER — EPHEDRINE SULFATE-NACL 50-0.9 MG/10ML-% IV SOSY
PREFILLED_SYRINGE | INTRAVENOUS | Status: DC | PRN
  Administered 2024-02-02: 5 mg via INTRAVENOUS

## 2024-02-02 MED ORDER — PROPOFOL 10 MG/ML IV BOLUS
INTRAVENOUS | Status: DC | PRN
  Administered 2024-02-02: 50 mg via INTRAVENOUS

## 2024-02-02 MED ORDER — CHLORHEXIDINE GLUCONATE CLOTH 2 % EX PADS
6.0000 | MEDICATED_PAD | Freq: Every day | CUTANEOUS | Status: DC
  Administered 2024-02-03: 6 via TOPICAL

## 2024-02-02 MED ORDER — ACETAMINOPHEN 500 MG PO TABS
ORAL_TABLET | ORAL | Status: AC
  Administered 2024-02-02: 1000 mg via ORAL
  Filled 2024-02-02: qty 2

## 2024-02-02 MED ORDER — SODIUM CHLORIDE 0.9 % IV SOLN: INTRAVENOUS | Status: DC

## 2024-02-02 MED ORDER — CHLORHEXIDINE GLUCONATE 0.12 % MT SOLN
OROMUCOSAL | Status: AC
  Administered 2024-02-02: 15 mL via OROMUCOSAL
  Filled 2024-02-02: qty 15

## 2024-02-02 MED ORDER — PHENYLEPHRINE 80 MCG/ML (10ML) SYRINGE FOR IV PUSH (FOR BLOOD PRESSURE SUPPORT)
PREFILLED_SYRINGE | INTRAVENOUS | Status: DC | PRN
  Administered 2024-02-02 (×3): 80 ug via INTRAVENOUS

## 2024-02-02 MED ORDER — FENTANYL CITRATE (PF) 100 MCG/2ML IJ SOLN
INTRAMUSCULAR | Status: AC
  Filled 2024-02-02: qty 2

## 2024-02-02 MED ORDER — FENTANYL CITRATE (PF) 100 MCG/2ML IJ SOLN
INTRAMUSCULAR | Status: DC | PRN
  Administered 2024-02-02: 25 ug via INTRAVENOUS

## 2024-02-02 MED ORDER — OXYCODONE HCL 5 MG/5ML PO SOLN: 5.0000 mg | Freq: Once | ORAL | Status: DC | PRN

## 2024-02-02 MED ORDER — OXYCODONE HCL 5 MG PO TABS
ORAL_TABLET | ORAL | Status: AC
  Filled 2024-02-02: qty 1

## 2024-02-02 MED ORDER — BUPIVACAINE HCL (PF) 0.5 % IJ SOLN
INTRAMUSCULAR | Status: AC
  Filled 2024-02-02: qty 30

## 2024-02-02 MED ORDER — HEPARIN SODIUM (PORCINE) 5000 UNIT/ML IJ SOLN
5000.0000 [IU] | Freq: Three times a day (TID) | INTRAMUSCULAR | Status: DC
  Administered 2024-02-03 (×2): 5000 [IU] via SUBCUTANEOUS
  Filled 2024-02-02 (×2): qty 1

## 2024-02-02 MED ORDER — LIDOCAINE HCL (PF) 1 % IJ SOLN
INTRAMUSCULAR | Status: AC
  Filled 2024-02-02: qty 30

## 2024-02-02 MED ORDER — ACETAMINOPHEN 500 MG PO TABS: 1000.0000 mg | ORAL_TABLET | Freq: Once | ORAL | Status: AC

## 2024-02-02 MED ORDER — BUPIVACAINE HCL (PF) 0.5 % IJ SOLN
INTRAMUSCULAR | Status: DC | PRN
  Administered 2024-02-02: 5 mL

## 2024-02-02 MED ORDER — MIDAZOLAM HCL (PF) 2 MG/2ML IJ SOLN
INTRAMUSCULAR | Status: DC | PRN
  Administered 2024-02-02: 1 mg via INTRAVENOUS

## 2024-02-02 MED ORDER — MEPERIDINE HCL 25 MG/ML IJ SOLN: 6.2500 mg | INTRAMUSCULAR | Status: DC | PRN

## 2024-02-02 MED ORDER — AMOXICILLIN-POT CLAVULANATE 500-125 MG PO TABS
1.0000 | ORAL_TABLET | Freq: Every day | ORAL | Status: DC
  Administered 2024-02-03: 1 via ORAL
  Filled 2024-02-02 (×2): qty 1

## 2024-02-02 MED ORDER — ORAL CARE MOUTH RINSE: 15.0000 mL | Freq: Once | OROMUCOSAL | Status: AC

## 2024-02-02 MED ORDER — OXYCODONE HCL 5 MG PO TABS
5.0000 mg | ORAL_TABLET | Freq: Once | ORAL | Status: AC
  Administered 2024-02-02: 5 mg via ORAL

## 2024-02-02 MED ORDER — OXYCODONE HCL 5 MG PO TABS: 5.0000 mg | ORAL_TABLET | Freq: Once | ORAL | Status: DC | PRN

## 2024-02-02 SURGICAL SUPPLY — 25 items
BLADE SURG 10 STRL SS (BLADE) ×1 IMPLANT
BLADE SURG 15 STRL LF DISP TIS (BLADE) ×1 IMPLANT
BNDG COHESIVE 3X5 TAN ST LF (GAUZE/BANDAGES/DRESSINGS) ×1 IMPLANT
BNDG COMPR ESMARK 4X3 LF (GAUZE/BANDAGES/DRESSINGS) ×1 IMPLANT
BNDG ELASTIC 3INX 5YD STR LF (GAUZE/BANDAGES/DRESSINGS) ×1 IMPLANT
BNDG ELASTIC 4X5.8 VLCR NS LF (GAUZE/BANDAGES/DRESSINGS) IMPLANT
BNDG GAUZE DERMACEA FLUFF 4 (GAUZE/BANDAGES/DRESSINGS) IMPLANT
CNTNR URN SCR LID CUP LEK RST (MISCELLANEOUS) IMPLANT
ELECTRODE REM PT RTRN 9FT ADLT (ELECTROSURGICAL) ×1 IMPLANT
GAUZE PAD ABD 8X10 STRL (GAUZE/BANDAGES/DRESSINGS) IMPLANT
GAUZE SPONGE 4X4 12PLY STRL (GAUZE/BANDAGES/DRESSINGS) ×1 IMPLANT
GAUZE STRETCH 2X75IN STRL (MISCELLANEOUS) ×1 IMPLANT
GAUZE XEROFORM 1X8 LF (GAUZE/BANDAGES/DRESSINGS) ×1 IMPLANT
GLOVE BIO SURGEON STRL SZ7 (GLOVE) IMPLANT
GLOVE BIO SURGEON STRL SZ7.5 (GLOVE) ×1 IMPLANT
GLOVE BIOGEL PI IND STRL 7.5 (GLOVE) ×1 IMPLANT
GOWN STRL REUS W/ TWL LRG LVL3 (GOWN DISPOSABLE) ×2 IMPLANT
KIT BASIN OR (CUSTOM PROCEDURE TRAY) ×1 IMPLANT
NEEDLE HYPO 25X1 1.5 SAFETY (NEEDLE) ×1 IMPLANT
PACK ORTHO EXTREMITY (CUSTOM PROCEDURE TRAY) ×1 IMPLANT
PADDING CAST ABS COTTON 4X4 ST (CAST SUPPLIES) ×2 IMPLANT
SET HNDPC FAN SPRY TIP SCT (DISPOSABLE) IMPLANT
SOLN STERILE WATER BTL 1000 ML (IV SOLUTION) ×1 IMPLANT
SYR CONTROL 10ML LL (SYRINGE) ×1 IMPLANT
UNDERPAD 30X36 HEAVY ABSORB (UNDERPADS AND DIAPERS) ×1 IMPLANT

## 2024-02-02 NOTE — Evaluation (Signed)
 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  Clinical Impression  Patient presents with decreased mobility due to L LE surgery and NWB status with difficulty maintaining NWB and safely maneuvering even short distances.  He was previously mobilizing at home with walker and transport wheelchair though no way to safely negotiate stairs he has to enter without putting his foot down on the stairs (and needing to enter/exit frequently for dialysis).  We discussed practicing during next therapy session though I feel he may opt to put the foot down anyway for safety.  Feel he may benefit from post-acute inpatient rehab (<3 hours/day) if he will agree.  If not, recommend HHPT at d/c.  PT will continue to follow during acute stay.         If plan is discharge home, recommend the following: A little help with walking and/or transfers;A little help with bathing/dressing/bathroom;Help with stairs or ramp for entrance;Assist for transportation;Assistance with cooking/housework   Can travel by private vehicle   Yes    Equipment Recommendations None recommended by PT  Recommendations for Other Services       Functional Status Assessment Patient has had a recent decline in their functional status and demonstrates the ability to make significant improvements in function in a reasonable and predictable amount of time.     Precautions / Restrictions Precautions Precautions: Fall Recall of Precautions/Restrictions: Intact Required Braces or Orthoses: Other Brace Other Brace: post-op shoe L  LE Restrictions Weight Bearing Restrictions Per Provider Order: Yes LLE Weight Bearing Per Provider Order: Non weight bearing      Mobility  Bed Mobility Overal bed mobility: Modified Independent             General bed mobility comments: puts legs back in bed to supine on his own    Transfers Overall transfer level: Needs assistance Equipment used: Rolling walker (2 wheels) Transfers: Sit to/from Stand Sit to Stand: Contact guard assist           General transfer comment: for balance, cues for NWB; stands with both feet on the floor    Ambulation/Gait Ambulation/Gait assistance: Contact guard assist, Supervision Gait Distance (Feet): 30 Feet Assistive device: Rolling walker (2 wheels) Gait Pattern/deviations: Step-to pattern, Decreased stride length, Shuffle, Trunk flexed       General Gait Details: hop to pattern forward though scuffing R foot on the floor and flexed posture, puts L foot down when turning around to walk back to bed or when turning to sit  Stairs            Wheelchair Mobility     Tilt Bed    Modified Rankin (Stroke Patients Only)       Balance Overall balance assessment: Needs assistance   Sitting balance-Leahy Scale: Good     Standing balance support: Bilateral upper extremity supported Standing balance-Leahy Scale: Poor Standing balance comment: for NWB L LE                             Pertinent Vitals/Pain Pain Assessment Pain Assessment:  No/denies pain    Home Living Family/patient expects to be discharged to:: Private residence Living Arrangements: Parent Available Help at Discharge: Family Type of Home: House Home Access: Stairs to enter Entrance Stairs-Rails: Right Entrance Stairs-Number of Steps: 4   Home Layout: One level Home Equipment: Cane - quad;Shower seat;Transport Restaurant [employer] (2 wheels)      Prior Function Prior Level of Function : Needs assist             Mobility  Comments: family help him in wheelchair though he walks down the stairs to get to and from dialysis. ADLs Comments: mother/brother help to get to dialysis     Extremity/Trunk Assessment   Upper Extremity Assessment Upper Extremity Assessment: Overall WFL for tasks assessed    Lower Extremity Assessment Lower Extremity Assessment: LLE deficits/detail LLE Deficits / Details: wrapped in ace wrap, able to move ankle, though cannot feel the foot, hip and knee [facility]       Communication   Communication Factors Affecting Communication: Hearing impaired    Cognition Arousal: Alert Behavior During Therapy: Agitated, WFL for tasks assessed/performed   PT - Cognitive impairments: No family/caregiver present to determine baseline, Problem solving, Safety/Judgement                       PT - Cognition Comments: behavior mostly WFL, some agitation when voicing frustration with timing in hospital (i.e. told he would get dialysis right after surgery, has not happened yet and may be 10pm before they take him; staff say i'll be right back and they do not come back for 3 hours). admits to putting weight on his foot for stairs at home and put the foot down to turn around in room today; some of it is it is what it is: but he will lose the foot if it cannot heal Following commands: Impaired Following commands impaired: Follows one step commands with increased time     Cueing Cueing Techniques: Verbal cues     General Comments General comments (skin integrity, edema, etc.): Discussed issues with his safety versus truly keeping the L foot off the floor to maximize opportunity to heal.  Patient has fallen x 1 and has to get out of the house to go to dialysis 3 days a week. Has 4 steps to enter the home and family cannot bump the transport w/c on stairs.  Educated PT will attempt to instruct in steps NWB on L though safety is a high priority too.    Exercises     Assessment/Plan    PT  Assessment Patient needs continued PT services  PT Problem List Decreased balance;Decreased mobility;Decreased knowledge of precautions;Decreased safety awareness;Decreased knowledge of use of DME       PT Treatment Interventions DME instruction;Gait training;Stair training;Patient/family education;Therapeutic activities;Therapeutic exercise;Balance training    PT Goals (Current goals can be found in the Care Plan section)  Acute Rehab PT Goals Patient Stated Goal: heal the foot PT Goal Formulation: With patient Time For Goal Achievement: 02/16/24 Potential to Achieve Goals: Good    Frequency Min 3X/week     Co-evaluation               AM-PAC PT 6 Clicks Mobility  Outcome Measure Help needed turning from your back to your side while in a flat bed without using bedrails?: None Help needed moving from lying on your back to sitting on the side of a flat bed without using bedrails?: None Help needed moving  to and from a bed to a chair (including a wheelchair)?: A Little Help needed standing up from a chair using your arms (e.g., wheelchair or bedside chair)?: A Little Help needed to walk in hospital room?: A Little Help needed climbing 3-5 steps with a railing? : Total 6 Click Score: 18    End of Session Equipment Utilized During Treatment: Gait belt Activity Tolerance: Patient tolerated treatment well Patient left: in bed;with call bell/phone within reach   PT Visit Diagnosis: Other abnormalities of gait and mobility (R26.89);History of falling (Z91.81)    Time: 1630-1650 PT Time Calculation (min) (ACUTE ONLY): 20 min   Charges:   PT Evaluation $PT Eval Moderate Complexity: 1 Mod   PT General Charges $$ ACUTE PT VISIT: 1 Visit         Micheline Portal, PT Acute Rehabilitation Services [phone] 02/02/2024   Montie Portal 02/02/2024, 5:29 PM

## 2024-02-02 NOTE — Progress Notes (Signed)
 PT Cancellation Note  Patient Details Name: Edgar Brewer MRN: [id number] DOB: 1960-09-26   Cancelled Treatment:    Reason Eval/Treat Not Completed: Patient at procedure or test/unavailable; down for TMA revision.  Will follow up.   Montie Portal 02/02/2024, 9:07 AM Micheline Portal, PT Acute Rehabilitation Services Office:[phone] 02/02/2024

## 2024-02-02 NOTE — Anesthesia Postprocedure Evaluation (Signed)
"   Anesthesia Post Note  Patient: [name]  Procedure(s) Performed: AMPUTATION, FOOT, TRANSMETATARSAL (Left: Toe)     Patient location during evaluation: PACU Anesthesia Type: General Level of consciousness: awake and alert Pain management: pain level controlled Vital Signs Assessment: post-procedure vital signs reviewed and stable Respiratory status: spontaneous breathing, nonlabored ventilation, respiratory function stable and patient connected to nasal cannula oxygen Cardiovascular status: blood pressure returned to baseline and stable Postop Assessment: no apparent nausea or vomiting Anesthetic complications: no   There were no known notable events for this encounter.  Last Vitals:  Vitals:   02/02/24 0945 02/02/24 1000  BP: 103/62 (!) 104/58  Pulse: 83 81  Resp: 18 19  Temp:  36.6 C  SpO2: 92% 99%    Last Pain:  Vitals:   02/02/24 1000  TempSrc:   PainSc: 0-No pain                 [name]      "

## 2024-02-02 NOTE — Plan of Care (Signed)
" °  Problem: Pain Managment: Goal: General experience of comfort will improve and/or be controlled Outcome: Progressing   Problem: Safety: Goal: Ability to remain free from injury will improve Outcome: Progressing   Problem: Fluid Volume: Goal: Ability to maintain a balanced intake and output will improve Outcome: Progressing   Problem: Tissue Perfusion: Goal: Adequacy of tissue perfusion will improve Outcome: Progressing   "

## 2024-02-02 NOTE — Progress Notes (Signed)
 Orthopedic Tech Progress Note Patient Details:  Edgar Brewer February 15, 1960 [id number]  Ortho Devices Type of Ortho Device: Postop shoe/boot Ortho Device/Splint Location: LLE Ortho Device/Splint Interventions: Ordered, Application, Adjustment   Post Interventions Patient Tolerated: Well Instructions Provided: Care of device, Adjustment of device  Adine MARLA Blush 02/02/2024, 9:34 AM

## 2024-02-02 NOTE — Consult Note (Signed)
 "  PODIATRY CONSULTATION  NAME Ronney Honeywell MRN [id number] DOB Apr 30, 1960 DOA 02/01/2024   Reason for consult: ulcer left foot  Attending/Consulting physician: L. Pokhrel MD  History of present illness:  Clarence Cogswell  is a 64 y.o. male, PMH of ESRD on HD MWF, GERD, CAD, PAD, HTN, DM2, chronic HFpEF, obesity, anemia, history of recent hospitalization and September for foot infection and culture-negative endocarditis, and hospitalization last December for respiratory failure with influenza A. - Patient presents for routine wound check to his podiatrist today, left TMA wound looked concerning with some discharge abnormal x-ray and wound dehiscence with concern of acute osteomyelitis in the podiatrist office, so direct admission requested in anticipation for surgery in a.m. for TMA revision and wound debridement, other workup including basic labs CBC, renal panel, blood cultures, chest x-ray, EKG, MRI of the left foot still pending.  Patient seen in office yesterday please see my note from the office.  Concern for chronic osteomyelitis of the left foot status post prior TMA.  I direct admitted him to the hospital for surgical intervention to include revision TMA.  Patient is willing to proceed.  Discussed risk of nonhealing and further infection if this occur he will be high risk for amputation    Past Medical History:  Diagnosis Date   Acute on chronic combined systolic and diastolic CHF, NYHA class 1 (HCC) 01/13/2015   Allergy    Arthritis    Chronic kidney disease    MWF [facility].   Diabetes mellitus type 2 with complications (HCC)    Previous insulin , non-insulin  requiring noted 09/2018.  Complications include retinopathy, peripheral neuropathy.   GERD (gastroesophageal reflux disease)    at times   Head injury with loss of consciousness [facility])    History of kidney stones    Hypertension    Neuromuscular disorder (HCC)    Neuropathy    Pulmonary hypertension (HCC)    PA peak pressure  41 mmHg 03/04/16 echo   Stroke [facility])    no residual weakness   Varicose veins of lower extremities with complications 12/24/2014       Latest Ref Rng & Units 02/02/2024    6:31 AM 02/01/2024    8:42 PM 01/18/2024    3:47 AM  CBC  WBC 4.0 - 10.5 K/uL 7.8  6.9  4.9   Hemoglobin 13.0 - 17.0 g/dL 89.5  89.0  88.7   Hematocrit 39.0 - 52.0 % 31.9  32.9  35.0   Platelets 150 - 400 K/uL 295  324  197        Latest Ref Rng & Units 02/02/2024    6:31 AM 02/01/2024    8:42 PM 01/18/2024    3:47 AM  BMP  Glucose 70 - 99 mg/dL 855  840  871   BUN 8 - 23 mg/dL 36  31  40   Creatinine 0.61 - 1.24 mg/dL 1.20  1.93  2.86   Sodium 135 - 145 mmol/L 136  136  134   Potassium 3.5 - 5.1 mmol/L 5.0  4.5  4.4   Chloride 98 - 111 mmol/L 94  93  91   CO2 22 - 32 mmol/L 29  31  25    Calcium  8.9 - 10.3 mg/dL 9.9  89.7  88.8       Physical Exam: Lower Extremity Exam  Left foot with erythema at the TMA stump site with some necrotic and ulcerated tissue medially and laterally with necrotic tissue as well as fibropurulent  drainage.  Full-thickness dehiscence that does probe to bone.  Decreased sensation to the left foot  Flap appears to have sufficient perfusion does have cap refill intact  XR left foot concern for osteomyelitis 1st through 5th metatarsal stump site from office yesterday. MRI L foot: pending  ASSESSMENT/PLAN OF CARE 64 y.o. male with PMHx significant for  ESRD on HD MWF, GERD, CAD, PAD, HTN, DM2, chronic HFpEF, obesity, anemia, history of recent hospitalization and September for foot infection and culture-negative endocarditis  with chronic osteomyelitis with draining sinus medial lateral of the left foot status post prior TMA  - N.p.o. for OR this morning for left foot TMA revision with further resection of metatarsals 1 through 5 debridement of any infected tissue and possible closure versus wound VAC possible antibiotic beads. - Recommend infectious disease consultation for antibiotic  therapy recommendation - Continue IV abx broad spectrum pending further culture data - Anticoagulation: Hold pending OR today okay to resume thereafter - Wound care: None required preop - WB status: Will be nonweightbearing to the left foot after surgery today - Will continue to follow   Thank you for the consult.  Please contact me directly with any questions or concerns.           [name], DPM [facility] / [facility]    [address] [place], [place] [address]                Office [phone]  Fax [phone]     "

## 2024-02-02 NOTE — Progress Notes (Signed)
 Pt receives out-pt HD at [facility] [place] on MWF 11:50 am chair time. Will assist as needed.   Randine Mungo Dialysis Navigator [phone]

## 2024-02-02 NOTE — Hospital Course (Signed)
 Edgar Brewer

## 2024-02-02 NOTE — Consult Note (Signed)
 Renal Service Consult Note [facility] Lamar JONETTA Fret, MD  Patient: Edgar Brewer Date: 02/02/2024 Requesting Physician: Dr. Sonjia  Reason for Consult: ESRD pt admitted for foot surgery HPI: The patient is a 64 y.o. year-old w/ PMH of esrd on HD, DM2, HTN, pHTN and CVA who presented for chronic osteomyelitis on the L foot, s/p TMA w/ dehiscence and ulceration, odor and drainage. Pt admitted direct to [facility] and went to OR this am for revision of TMA. We are asked to see for dialysis.    Pt seen in room. No c/o's.    ROS - denies CP, no joint pain, no HA, no blurry vision, no rash, no diarrhea, no nausea/ vomiting   Past Medical History  Past Medical History:  Diagnosis Date   Acute on chronic combined systolic and diastolic CHF, NYHA class 1 (HCC) 01/13/2015   Allergy    Arthritis    Chronic kidney disease    MWF [facility].   Diabetes mellitus type 2 with complications (HCC)    Previous insulin , non-insulin  requiring noted 09/2018.  Complications include retinopathy, peripheral neuropathy.   GERD (gastroesophageal reflux disease)    at times   Head injury with loss of consciousness (HCC)    History of kidney stones    Hypertension    Neuromuscular disorder (HCC)    Neuropathy    Pulmonary hypertension (HCC)    PA peak pressure 41 mmHg 03/04/16 echo   Stroke [facility])    no residual weakness   Varicose veins of lower extremities with complications 12/24/2014   Past Surgical History  Past Surgical History:  Procedure Laterality Date   ABDOMINAL AORTOGRAM W/LOWER EXTREMITY N/A 12/01/2023   Procedure: ABDOMINAL AORTOGRAM W/LOWER EXTREMITY;  Surgeon: Magda Debby SAILOR, MD;  Location: [facility] INVASIVE CV LAB;  Service: Cardiovascular;  Laterality: N/A;   AMPUTATION TOE Left 09/21/2023   Procedure: AMPUTATION, TOE;  Surgeon: Malvin Marsa FALCON, DPM;  Location: [facility] OR;  Service: Orthopedics/Podiatry;  Laterality: Left;  Left 2nd toe amputation   AV FISTULA PLACEMENT Left  06/01/2016   Procedure: ARTERIOVENOUS (AV) FISTULA CREATION;  Surgeon: Oris Krystal FALCON, MD;  Location: [facility] OR;  Service: Vascular;  Laterality: Left;   AV FISTULA PLACEMENT Left 08/10/2017   Procedure: BRACHIOCEPHALIC ARTERIOVENOUS (AV) FISTULA CREATION LEFT UPPER EXTREMITY;  Surgeon: Eliza Lonni RAMAN, MD;  Location: [facility] OR;  Service: Vascular;  Laterality: Left;   CHOLECYSTECTOMY N/A 10/04/2018   Procedure: LAPAROSCOPIC CHOLECYSTECTOMY;  Surgeon: Stevie Herlene Righter, MD;  Location: [facility] OR;  Service: General;  Laterality: N/A;   COLONOSCOPY W/ POLYPECTOMY  03/2016   For evaluation of iron deficiency anemia.  Dr. Shila.  10 polyps, largest 20 mm.  Pathology: tubular adenomas with no high-grade dysplasia, hyperplastic.  None bleeding internal hemorrhoids.  Pandiverticulosis.   CYSTOSCOPY WITH RETROGRADE PYELOGRAM, URETEROSCOPY AND STENT PLACEMENT Bilateral 02/25/2021   Procedure: CYSTOSCOPY WITH BILATERAL  RETROGRADE PYELOGRAM, RIGHT DIAGNOSTIC URETEROSCOPY,  BLADDER BIOPSY WITH FULGERATION;  Surgeon: Alvaro Hummer, MD;  Location: [facility] ORS;  Service: Urology;  Laterality: Bilateral;   ENDOVENOUS ABLATION SAPHENOUS VEIN W/ LASER Left 07-23-2015   endovenous laser ablation left greater saphenous vein by Krystal Oris MD   ENDOVENOUS ABLATION SAPHENOUS VEIN W/ LASER Right 10/15/2015   EVLA right greater saphenous vein by Krystal Oris MD   ESOPHAGOGASTRODUODENOSCOPY  03/2016   For evaluation of IDA.  Dr. Shila.  Duodenal erythema, pathology benign mucosa, no villous atrophy.   GALLBLADDER SURGERY  10/2018   HOLMIUM LASER APPLICATION Right  02/25/2021   Procedure: HOLMIUM LASER OF TUMOR;  Surgeon: Alvaro Hummer, MD;  Location: [facility] ORS;  Service: Urology;  Laterality: Right;   IR RADIOLOGIST EVAL & MGMT  11/29/2018   LOWER EXTREMITY ANGIOGRAPHY N/A 09/20/2023   Procedure: Lower Extremity Angiography;  Surgeon: Magda Debby SAILOR, MD;  Location: [facility] INVASIVE CV LAB;  Service: Cardiovascular;  Laterality: N/A;    LOWER EXTREMITY ANGIOGRAPHY N/A 12/01/2023   Procedure: Lower Extremity Angiography;  Surgeon: Magda Debby SAILOR, MD;  Location: [facility] INVASIVE CV LAB;  Service: Cardiovascular;  Laterality: N/A;   LOWER EXTREMITY INTERVENTION Left 09/20/2023   Procedure: LOWER EXTREMITY INTERVENTION;  Surgeon: Magda Debby SAILOR, MD;  Location: [facility] INVASIVE CV LAB;  Service: Cardiovascular;  Laterality: Left;   LOWER EXTREMITY INTERVENTION N/A 12/01/2023   Procedure: LOWER EXTREMITY INTERVENTION;  Surgeon: Magda Debby SAILOR, MD;  Location: [facility] INVASIVE CV LAB;  Service: Cardiovascular;  Laterality: N/A;   TEE WITHOUT CARDIOVERSION N/A 11/19/2018   Procedure: TRANSESOPHAGEAL ECHOCARDIOGRAM (TEE);  Surgeon: Alveta Aleene PARAS, MD;  Location: [facility] ENDOSCOPY;  Service: Cardiovascular;  Laterality: N/A;   TRANSESOPHAGEAL ECHOCARDIOGRAM (CATH LAB) N/A 11/01/2023   Procedure: TRANSESOPHAGEAL ECHOCARDIOGRAM;  Surgeon: Floretta Mallard, MD;  Location: [facility] INVASIVE CV LAB;  Service: Cardiovascular;  Laterality: N/A;   TRANSMETATARSAL AMPUTATION Left 10/23/2023   Procedure: AMPUTATION, FOOT, TRANSMETATARSAL;  Surgeon: Malvin Marsa FALCON, DPM;  Location: [facility] OR;  Service: Orthopedics/Podiatry;  Laterality: Left;   Family History  Family History  Problem Relation Age of Onset   Arthritis Mother    Hearing loss Father    Prostate cancer Father    Hyperlipidemia Maternal Grandmother    Heart disease Maternal Grandmother    Lung cancer Maternal Grandfather    Social History  reports that he quit smoking about 9 years ago. His smoking use included cigarettes. He started smoking about 54 years ago. He has a 45 pack-year smoking history. He has never used smokeless tobacco. He reports current alcohol use of about 1.0 standard drink of alcohol per week. He reports that he does not use drugs. Allergies Allergies[1] Home medications Prior to Admission medications  Medication Sig Start Date End Date Taking? Authorizing Provider  aspirin  EC  81 MG tablet Take 81 mg by mouth daily. Swallow whole.   Yes [provider]  b complex-vitamin c -folic acid  (NEPHRO-VITE) 0.8 MG TABS tablet Take 1 tablet by mouth in the morning.   Yes [provider]  cinacalcet  (SENSIPAR ) 60 MG tablet Take 60 mg by mouth See admin instructions. In the evening. Tuesday Thursday, Saturday and Sunday   Yes [provider]  clopidogrel  (PLAVIX ) 75 MG tablet Take 1 tablet (75 mg total) by mouth daily. 12/01/23 11/30/24 Yes Magda Debby SAILOR, MD  diphenhydramine -acetaminophen  (TYLENOL  PM) 25-500 MG TABS tablet Take 1 tablet by mouth at bedtime as needed (Sleep/Pain).   Yes [provider]  Docusate Calcium  (STOOL SOFTENER PO) Take 4 tablets by mouth at bedtime.   Yes [provider]  gabapentin  (NEURONTIN ) 100 MG capsule TAKE 1 CAPSULE BY MOUTH THREE TIMES DAILY 11/13/23  Yes Katrinka Garnette KIDD, MD  LANTUS  SOLOSTAR 100 UNIT/ML Solostar Pen Inject 30 Units into the skin daily. 01/18/24  Yes Tobie Yetta HERO, MD  omeprazole  (PRILOSEC) 20 MG capsule Take 1 capsule by mouth once daily Patient taking differently: Take 20 mg by mouth every evening. 11/13/23  Yes Katrinka Garnette KIDD, MD  rosuvastatin  (CRESTOR ) 10 MG tablet Take 1 tablet by mouth once daily Patient taking differently: Take  10 mg by mouth every evening. 11/13/23  Yes Katrinka Garnette KIDD, MD  sevelamer  carbonate (RENVELA ) 800 MG tablet Take 2 tablets (1,600 mg total) by mouth 3 (three) times daily with meals. Patient taking differently: Take 2-3 tablets by mouth See admin instructions. Take 3 tablets by mouth 3 times daily with meals. Then take 2 tablets by mouth with snacks. 10/22/18  Yes Cheryle Page, MD     Vitals:   02/02/24 9077 02/02/24 0930 02/02/24 0945 02/02/24 1000  BP:  106/60 103/62 (!) 104/58  Pulse: 83 82 83 81  Resp: 14 14 18 19   Temp:    97.8 F (36.6 C)  TempSrc:      SpO2: 94% 91% 92% 99%  Weight:      Height:       Exam Gen alert, no  distress Sclera anicteric, throat clear  No jvd or bruits Chest clear bilat to bases RRR no MRG Abd soft ntnd no mass or ascites +bs Ext no LE or UE edema, L foot wrapped Neuro is alert, Ox 3 , nf    LUA AVF+bruit  Home bp meds: none   OP HD: MWF NW 4h  B400   87kg  2K  AVF  Hep 2600 Last OP HD 1/07, post wt 88.6kg Usually gets close to dry wt, usual UF 1-4kg   Assessment/ Plan:  # ESRD - on HD MWF, has not missed OP HD - plan HD today/ tonight   # BP - not on any bp lowering meds at home - BP's 100s here    # Volume - looks euvolemic on exam - up 2-3 kg by wts   # Anemia of esrd - Hb 10-12 here, follow   # L TMA chronic osteomyelitis - admitted for IV abx - and for revision of the TMA wound done 1/09    Myer Fret  MD [facility] 02/02/2024, 11:59 AM  Recent Labs  Lab 02/01/24 2042 02/02/24 0631  HGB 10.9* 10.4*  ALBUMIN  3.7  --   CALCIUM  10.2 9.9  PHOS 5.0*  --   CREATININE 8.06* 8.79*  K 4.5 5.0   Inpatient medications:  amoxicillin -clavulanate  1 tablet Oral QHS   aspirin  EC  81 mg Oral Daily   cinacalcet   60 mg Oral Q T,Th,Sat-1800   clopidogrel   75 mg Oral Daily   doxycycline   100 mg Oral Q12H   gabapentin   100 mg Oral TID   heparin  injection (subcutaneous)  5,000 Units Subcutaneous Q8H   insulin  aspart  0-6 Units Subcutaneous TID WC   insulin  glargine-yfgn  15 Units Subcutaneous Daily   multivitamin  1 tablet Oral QHS   pantoprazole   40 mg Oral Daily   rosuvastatin   10 mg Oral Daily   sevelamer  carbonate  1,600 mg Oral TID WC    acetaminophen  **OR** acetaminophen , albuterol , docusate sodium , hydrALAZINE , polyethylene glycol      [1]  Allergies Allergen Reactions   Codeine Anaphylaxis, Hives, Swelling and Other (See Comments)    Swelling all over body and the throat   Clindamycin /Lincomycin Nausea And Vomiting

## 2024-02-02 NOTE — Op Note (Signed)
 Full Operative Report  Date of Operation: 8:28 AM, 02/02/2024   Patient: Edgar Brewer - 64 y.o. male  Surgeon: Malvin Marsa FALCON, DPM   Assistant: None  Diagnosis: osteomyelitis chronic with draining sinus left foot TMA site  Procedure:  1.  Revision transmetatarsal amputation to lisfranc joint, left foot    Anesthesia: General  No responsible provider has been recorded for the case.  Anesthesiologist: Mallory Manus, MD   Estimated Blood Loss: Minimal   Hemostasis: 1) Anatomical dissection, mechanical compression, electrocautery 2) no tourniquet was used during procedure  Implants: * No implants in log *  Materials: Prolene 2-0 skin staples  Injectables: 1) Pre-operatively: 10 cc of 50:50 mixture 1%lidocaine  plain and 0.5% marcaine  plain 2) Post-operatively: None   Specimens: - Pathology: Left foot metatarsals 1 through 5 for pathology - Microbiology: Deep tissue culture left foot   Antibiotics: IV antibiotics given per schedule on the floor  Drains: None  Complications: Patient tolerated the procedure well without complication.   Operative findings: As below in detailed report  Indications for Procedure: Edgar Brewer presents to Malvin Marsa FALCON, DPM with a chief complaint of prior TMA on the left foot with medial lateral dehiscence with necrotic and fibropurulent tissue present concern for underlying chronic osteomyelitis 1st through 5th metatarsal stump.  The patient has failed conservative treatments of various modalities. At this time the patient has elected to proceed with surgical correction. All alternatives, risks, and complications of the procedures were thoroughly explained to the patient. Patient exhibits appropriate understanding of all discussion points and informed consent was signed and obtained in the chart with no guarantees to surgical outcome given or implied.  Description of Procedure: Patient was brought to the operating room.  Patient remained on their hospital bed in the supine position. A surgical timeout was performed and all members of the operating room, the procedure, and the surgical site were identified. anesthesia occurred as per anesthesia record. Local anesthetic as previously described was then injected about the operative field in a local infiltrative block.  The operative lower extremity as noted above was then prepped and draped in the usual sterile manner. The following procedure then began.  Attention was directed to the LEFT foot transmetatarsal amputation stump. There was noted to be necrotic tissue medial and lateral with dehiscence that probed to bone and fibropurulent tissue.  Marking pen was used to mark out a elliptical area of soft tissue at the distal stump of the amputation site following the prior contour. Full-thickness incision was made with 10 blade to excise the ellipse of tissue. A deep tissue culture bone from first met stump was harvested and sent for micro, this bone was noted to be soft and eroded. Then using 10 blade and 15 blade the 1st through 5th metatarsal stumps were exposed and were noted to be soft necrotic consistent with chronic osteomyelitis. Then using a  sagittal saw the 1st through 5th metatarsal was resected back to the lisfranc joint and metatarsal disarticulated at TMTJ levell. The resected  1st - 5th metatarsal were sent for pathology with the proximal margin being the cut margin. After this the amputation site was irrigated thoroughly with 1 L of sterile saline via pulse lavage. Hemostasis was achieved with electrocautery as needed. There was noted to be good bleeding from the distal dorsal and plantar flap. No evidence of residual infection. No evidence of obvious avascularity. Surgical site was then closed with prlolene 2-0 and skin staples under minimal tension.   The  surgical site was then dressed with Xeroform 4 x 4 ABD pad Kerlix Ace. The patient tolerated both the  procedure and anesthesia well with vital signs stable throughout. The patient was transferred in good condition and all vital signs stable  from the OR to recovery under the discretion of anesthesia.  Condition: Vital signs stable, neurovascular status unchanged from preoperative   Surgical plan:  Expectantly margin at the resection margin which is now the tarsometatarsal joint on the left foot.  Good bleeding seen expected healing if patient can maintain nonweightbearing and allow for healing.  ABX per ID or at this point given expected clean margin can transition to 7 days p.o. antibiotics Augmentin  and doxycycline .  Nonweightbearing to the left foot in postop shoe.  The patient will be nonweightbearing in a postop shoe to the operative limb until further instructed. The dressing is to remain clean, dry, and intact. Will continue to follow unless noted elsewhere.   Marsa Honour, DPM [facility]

## 2024-02-02 NOTE — Progress Notes (Signed)
 " PROGRESS NOTE  Edgar Brewer FMW:[id number] DOB: [birth year] DOA: 02/01/2024 PCP: Katrinka Garnette KIDD, MD   LOS: 1 day   Brief narrative:  Edgar Brewer  is a 64 y.o. male, PMH of ESRD on HD MWF, GERD, CAD, PAD, HTN, DM2, chronic HFpEF, obesity, anemia, history of recent hospitalization and September for foot infection and culture-negative endocarditis, and recent hospitalization for influenza A in December presented to hospital with some discharge from his left TMA wound.  He was seen at the podiatry clinic.  X-ray showed some abnormality and was admitted to the hospital for surgical intervention.   Assessment/Plan: Principal Problem:   Acute osteomyelitis of right foot (HCC) Active Problems:   Obesity   Anemia of chronic kidney failure   PAD (peripheral artery disease)   Chronic diastolic CHF (congestive heart failure) (HCC)   Type 2 diabetes mellitus with diabetic neuropathy, unspecified (HCC)   History of stroke   GERD (gastroesophageal reflux disease)   Wound of left leg  Left foot acute osteomyelitis with wound dehiscence. History of TMA of the left foot, he was treated with IV vancomycin  and ceftazidime  in the past for culture-negative endocarditis, with end of therapy day 12/13/2023 .  Patient presented to podiatry office concerning for wound dehiscence and infection.  MRI of the foot has been performed.  Blood cultures have been sent.Underwent surgical intervention and revision transmetatarsal amputation of metatarsals 1 through 5 and debridement of the ulceration.    Currently on Augmentin  and doxycycline  after surgical intervention.   ESRD Patient is on Monday Wednesday Friday hemodialysis.  Nephrology has been consulted for hemodialysis needs.  HTN. Not on medications.  On as needed hydralazine .  Blood pressure seems to be stable at this time.   Type 2 diabetes mellitus. -On Lantus  30 units daily at bedtime.  Currently on 15 units since n.p.o. for procedure .  Continue sliding  scale insulin , hemoglobin A1c of 6.1.  History of CVA and PAD. Hyperlipidemia On Plavix  and aspirin  continue statins.   Anemia of chronic kidney disease. Latest hemoglobin of 10.4.  Nephrology on board.  DVT prophylaxis: heparin  injection 5,000 Units Start: 02/02/24 2200 SCDs Start: 02/01/24 1631   Disposition: Home likely in 1 to 2 days  Status is: Inpatient Remains inpatient appropriate because: Status post TMA, pending clinical improvement    Code Status:     Code Status: Full Code  Family Communication: None at bedside  Consultants: Podiatry Nephrology  Procedures: Revision TMA of the left foot on 02/02/2024  Anti-infectives:  Augmentin  and doxycycline   Anti-infectives (From admission, onward)    Start     Dose/Rate Route Frequency Ordered Stop   02/02/24 2200  amoxicillin -clavulanate (AUGMENTIN ) 500-125 MG per tablet 1 tablet        1 tablet Oral Daily at bedtime 02/02/24 1116 02/05/24 2159   02/02/24 2200  doxycycline  (VIBRA -TABS) tablet 100 mg        100 mg Oral Every 12 hours 02/02/24 1116 02/05/24 0959   02/01/24 1900  vancomycin  (VANCOREADY) IVPB 2000 mg/400 mL        2,000 mg 200 mL/hr over 120 Minutes Intravenous  Once 02/01/24 1804 02/02/24 0109   02/01/24 1900  cefTAZidime  (FORTAZ ) 1 g in sodium chloride  0.9 % 100 mL IVPB  Status:  Discontinued        1 g 200 mL/hr over 30 Minutes Intravenous Every 24 hours 02/01/24 1804 02/02/24 1116        Subjective: Today, patient was seen and examined at  bedside.  Seen after surgical intervention.  Complains of mild foot discomfort.  Denies any shortness of breath dyspnea chest pain fever chills or rigor.  Objective: Vitals:   02/02/24 0945 02/02/24 1000  BP: 103/62 (!) 104/58  Pulse: 83 81  Resp: 18 19  Temp:  97.8 F (36.6 C)  SpO2: 92% 99%    Intake/Output Summary (Last 24 hours) at 02/02/2024 1201 Last data filed at 02/02/2024 0909 Gross per 24 hour  Intake 650 ml  Output --  Net 650 ml    Filed Weights   02/01/24 1800 02/02/24 0805  Weight: 89.8 kg 89.9 kg   Body mass index is 30.14 kg/m.   Physical Exam: GENERAL: Patient is alert awake and oriented. Not in obvious distress. HENT: No scleral pallor or icterus. Pupils equally reactive to light. Oral mucosa is moist NECK: is supple, no gross swelling noted. CHEST: Clear to auscultation. No crackles or wheezes.  Diminished breath sounds bilaterally. CVS: S1 and S2 heard, no murmur. Regular rate and rhythm.  ABDOMEN: Soft, non-tender, bowel sounds are present. EXTREMITIES: Left TMA with Ace bandage. CNS: Cranial nerves are intact. No focal motor deficits. SKIN: warm and dry, left foot with dressing.  Data Review: I have personally reviewed the following laboratory data and studies,  CBC: Recent Labs  Lab 02/01/24 2042 02/02/24 0631  WBC 6.9 7.8  HGB 10.9* 10.4*  HCT 32.9* 31.9*  MCV 91.6 91.9  PLT 324 295   Basic Metabolic Panel: Recent Labs  Lab 02/01/24 2042 02/02/24 0631  NA 136 136  K 4.5 5.0  CL 93* 94*  CO2 31 29  GLUCOSE 159* 144*  BUN 31* 36*  CREATININE 8.06* 8.79*  CALCIUM  10.2 9.9  PHOS 5.0*  --    Liver Function Tests: Recent Labs  Lab 02/01/24 2042  ALBUMIN  3.7   No results for input(s): LIPASE, AMYLASE in the last 168 hours. No results for input(s): AMMONIA in the last 168 hours. Cardiac Enzymes: No results for input(s): CKTOTAL, CKMB, CKMBINDEX, TROPONINI in the last 168 hours. BNP (last 3 results) Recent Labs    10/19/23 1449  BNP 387.7*    ProBNP (last 3 results) No results for input(s): PROBNP in the last 8760 hours.  CBG: Recent Labs  Lab 02/01/24 2112 02/02/24 0623 02/02/24 0808 02/02/24 0918 02/02/24 1110  GLUCAP 155* 138* 138* 127* 131*   Recent Results (from the past 240 hours)  Culture, blood (Routine X 2) w Reflex to ID Panel     Status: None (Preliminary result)   Collection Time: 02/01/24  8:41 PM   Specimen: BLOOD  Result Value  Ref Range Status   Specimen Description BLOOD SITE NOT SPECIFIED  Final   Special Requests   Final    BOTTLES DRAWN AEROBIC AND ANAEROBIC Blood Culture adequate volume   Culture   Final    NO GROWTH < 12 HOURS Performed at [facility], [address] [place], [place] [address]    Report Status PENDING  Incomplete  Culture, blood (Routine X 2) w Reflex to ID Panel     Status: None (Preliminary result)   Collection Time: 02/01/24  8:49 PM   Specimen: BLOOD  Result Value Ref Range Status   Specimen Description BLOOD SITE NOT SPECIFIED  Final   Special Requests   Final    BOTTLES DRAWN AEROBIC AND ANAEROBIC Blood Culture adequate volume   Culture   Final    NO GROWTH < 12 HOURS Performed at  [facility], [address], [place], [place] [address]    Report Status PENDING  Incomplete  MRSA Next Gen by PCR, Nasal     Status: None   Collection Time: 02/02/24  6:25 AM   Specimen: Nasal Mucosa; Nasal Swab  Result Value Ref Range Status   MRSA by PCR Next Gen NOT DETECTED NOT DETECTED Final    Comment: (NOTE) The GeneXpert MRSA Assay (FDA approved for NASAL specimens only), is one component of a comprehensive MRSA colonization surveillance program. It is not intended to diagnose MRSA infection nor to guide or monitor treatment for MRSA infections. Test performance is not FDA approved in patients less than 77 years old. Performed at [facility], [address] [place], [place] [address]      Studies: MR FOOT LEFT WO CONTRAST Result Date: 02/02/2024 CLINICAL DATA:  Left foot wound status post transmetatarsal amputation in September 2025. Concern for osteomyelitis. EXAM: MRI OF THE LEFT FOOT WITHOUT CONTRAST TECHNIQUE: Multiplanar, multisequence MR imaging of the left foot was performed. No intravenous contrast was administered. COMPARISON:  Earlier same day radiographs of the left foot. MRI left foot dated 10/21/2023. FINDINGS: Despite efforts by the technologist and  patient, motion artifact is present on today's exam and could not be eliminated. This reduces exam sensitivity and specificity. Soft tissue/Bones/Joint/Cartilage Status post transmetatarsal amputation of the first through fifth rays to the level of the proximal metatarsal shafts. Suspected sinus tract with fluid signal at the dorsal lateral amputation stump at the level of the remnant proximal fifth metatarsal measuring up to 2 mm in thickness extends to the transsection margin of the remnant proximal fifth metatarsal with associated marrow edema, concerning for osteomyelitis. Marrow edema with indistinctness of the transsection margin and shaft of the proximal remnant first metatarsal is concerning for osteomyelitis. Less pronounced edema like signal of the transsection margins of the remnant proximal second, third, and fourth metatarsals without appreciable corresponding T1 hypointensity could reflect reactive marrow changes, however, osteomyelitis can not be excluded. No additional acute osseous abnormality identified. Mild-to-moderate degenerative changes of the second TMT joint and mild degenerative changes elsewhere throughout the midfoot. No significant joint effusion. Ligaments Lisfranc ligament appears intact. Muscles and Tendons Postoperative changes related to transmetatarsal amputation. No significant tenosynovial fluid collection. Soft tissue No drainable abscess. Cutaneous thickening along the dorsal and lateral amputation stump. IMPRESSION: 1. Postoperative changes related to transmetatarsal amputation of the first through fifth rays to the level of the proximal metatarsal shafts. Suspected sinus tract with fluid signal measuring up to 2 mm in thickness and surrounding inflammatory change at the dorsal lateral amputation stump extends to the transsection margin of the remnant proximal fifth metatarsal with associated marrow edema, concerning for osteomyelitis. Marrow edema with indistinctness of the  transsection margin and shaft of the proximal remnant first metatarsal is concerning for osteomyelitis. 2. Less pronounced edema like signal of the transsection margins of the remnant proximal second, third, and fourth metatarsals without appreciable corresponding T1 hypointensity could reflect reactive marrow changes, however, osteomyelitis can not be excluded. 3. No drainable abscess. Electronically Signed   By: Harrietta Sherry M.D.   On: 02/02/2024 09:29   DG Chest Port 1 View Result Date: 02/01/2024 EXAM: 1 VIEW(S) XRAY OF THE CHEST 02/01/2024 05:38:00 PM COMPARISON: 01/16/2024 CLINICAL HISTORY: Cough FINDINGS: LUNGS AND PLEURA: Low lung volumes with bronchovascular crowding. Left basilar linear opacities consistent with atelectasis. No pleural effusion. No pneumothorax. HEART AND MEDIASTINUM: No acute abnormality of the  cardiac and mediastinal silhouettes. BONES AND SOFT TISSUES: No acute osseous abnormality. IMPRESSION: 1. No acute findings. Electronically signed by: Greig Pique MD MD 02/01/2024 08:07 PM EST RP Workstation: [id number]   DG Foot Complete Left Result Date: 02/01/2024 Please see detailed radiograph report in office note.     Bastian Andreoli, MD  [facility] 02/02/2024  If 7PM-7AM, please contact night-coverage             "

## 2024-02-02 NOTE — Progress Notes (Addendum)
 OT Cancellation Note  Patient Details Name: Edgar Brewer MRN: [id number] DOB: 1960-02-10   Cancelled Treatment:    Reason Eval/Treat Not Completed: Patient at procedure or test/ unavailable Patient off floor for amputation revision. OT will check back for evaluation as time permits.   Ronal Gift E. Dynasia Kercheval, OTR/L Acute Rehabilitation Services [phone]   Ronal Gift Salt 02/02/2024, 9:17 AM

## 2024-02-02 NOTE — Transfer of Care (Signed)
 Immediate Anesthesia Transfer of Care Note  Patient: Edgar Brewer  Procedure(s) Performed: AMPUTATION, FOOT, TRANSMETATARSAL (Left: Toe)  Patient Location: PACU  Anesthesia Type:General  Level of Consciousness: awake and alert   Airway & Oxygen Therapy: Patient Spontanous Breathing  Post-op Assessment: Report given to RN  Post vital signs: stable  Last Vitals:  Vitals Value Taken Time  BP 109/60 02/02/24 09:15  Temp 36.8 C 02/02/24 09:15  Pulse 83 02/02/24 09:22  Resp 14 02/02/24 09:22  SpO2 94 % 02/02/24 09:22    Last Pain:  Vitals:   02/02/24 0915  TempSrc:   PainSc: Asleep         Complications: There were no known notable events for this encounter.

## 2024-02-03 ENCOUNTER — Encounter (HOSPITAL_COMMUNITY): Payer: Self-pay | Admitting: Podiatry

## 2024-02-03 DIAGNOSIS — M86171 Other acute osteomyelitis, right ankle and foot: Secondary | ICD-10-CM | POA: Diagnosis not present

## 2024-02-03 LAB — CBC
HCT: 32.4 % — ABNORMAL LOW (ref 39.0–52.0)
Hemoglobin: 10.5 g/dL — ABNORMAL LOW (ref 13.0–17.0)
MCH: 29.8 pg (ref 26.0–34.0)
MCHC: 32.4 g/dL (ref 30.0–36.0)
MCV: 92 fL (ref 80.0–100.0)
Platelets: 275 K/uL (ref 150–400)
RBC: 3.52 MIL/uL — ABNORMAL LOW (ref 4.22–5.81)
RDW: 14.6 % (ref 11.5–15.5)
WBC: 7 K/uL (ref 4.0–10.5)
nRBC: 0 % (ref 0.0–0.2)

## 2024-02-03 LAB — BASIC METABOLIC PANEL WITH GFR
Anion gap: 12 (ref 5–15)
BUN: 20 mg/dL (ref 8–23)
CO2: 28 mmol/L (ref 22–32)
Calcium: 9.5 mg/dL (ref 8.9–10.3)
Chloride: 95 mmol/L — ABNORMAL LOW (ref 98–111)
Creatinine, Ser: 5.91 mg/dL — ABNORMAL HIGH (ref 0.61–1.24)
GFR, Estimated: 10 mL/min — ABNORMAL LOW
Glucose, Bld: 139 mg/dL — ABNORMAL HIGH (ref 70–99)
Potassium: 4.6 mmol/L (ref 3.5–5.1)
Sodium: 135 mmol/L (ref 135–145)

## 2024-02-03 LAB — GLUCOSE, CAPILLARY
Glucose-Capillary: 127 mg/dL — ABNORMAL HIGH (ref 70–99)
Glucose-Capillary: 214 mg/dL — ABNORMAL HIGH (ref 70–99)
Glucose-Capillary: 150 mg/dL — ABNORMAL HIGH (ref 70–99)

## 2024-02-03 MED ORDER — NEPRO/CARBSTEADY PO LIQD: 237.0000 mL | Freq: Two times a day (BID) | ORAL | Status: DC

## 2024-02-03 MED ORDER — OXYCODONE HCL 5 MG PO TABS
5.0000 mg | ORAL_TABLET | ORAL | Status: DC | PRN
  Administered 2024-02-03 (×3): 5 mg via ORAL
  Filled 2024-02-03 (×3): qty 1

## 2024-02-03 MED ORDER — DOXYCYCLINE HYCLATE 100 MG PO TABS: 100.0000 mg | ORAL_TABLET | Freq: Two times a day (BID) | ORAL | 0 refills | Status: AC

## 2024-02-03 MED ORDER — OXYCODONE HCL 5 MG PO TABS: 5.0000 mg | ORAL_TABLET | Freq: Four times a day (QID) | ORAL | 0 refills | Status: DC | PRN

## 2024-02-03 MED ORDER — AMOXICILLIN-POT CLAVULANATE 500-125 MG PO TABS: 1.0000 | ORAL_TABLET | Freq: Every day | ORAL | 0 refills | Status: AC

## 2024-02-03 NOTE — Progress Notes (Addendum)
 " PROGRESS NOTE  Edgar Brewer FMW:[id number] DOB: 1960/04/28 DOA: 02/01/2024 PCP: Katrinka Garnette KIDD, MD   LOS: 2 days   Brief narrative:  Edgar Brewer  is a 64 y.o. male, PMH of ESRD on HD MWF, GERD, CAD, PAD, HTN, DM2, chronic HFpEF, obesity, anemia, history of recent hospitalization and September for foot infection and culture-negative endocarditis, and recent hospitalization for influenza A in December presented to hospital with some discharge from his left TMA wound.  He was seen at the podiatry clinic.  X-ray showed some abnormality and was admitted to the hospital for surgical intervention.   Assessment/Plan: Principal Problem:   Acute osteomyelitis of right foot (HCC) Active Problems:   Obesity   Anemia of chronic kidney failure   PAD (peripheral artery disease)   Chronic diastolic CHF (congestive heart failure) (HCC)   Type 2 diabetes mellitus with diabetic neuropathy, unspecified (HCC)   History of stroke   GERD (gastroesophageal reflux disease)   Wound of left leg  Left foot acute osteomyelitis with wound dehiscence. History of TMA of the left foot, he was treated with IV vancomycin  and ceftazidime  in the past for culture-negative endocarditis, with end of therapy day 12/13/2023 .  Patient presented to the podiatry office concerning for wound dehiscence and infection.  MRI of the foot has been performed.  Blood cultures have been sent. Patient underwent surgical intervention and revision transmetatarsal amputation of metatarsals 1 through 5 and debridement of the ulceration.    Currently on Augmentin  and doxycycline  after surgical intervention.  PT has seen the patient and recommended skilled nursing facility placement.   ESRD Patient is on Monday Wednesday Friday hemodialysis.  Nephrology on  board for  hemodialysis needs.  Essential hypertension Not on medications.  On as needed hydralazine .  Blood pressure seems to be stable at this time.   Type 2 diabetes mellitus. -On  Lantus  30 units daily at bedtime.  Currently on 15 units since n.p.o. for procedure .  Continue sliding scale insulin , hemoglobin A1c of 6.1.  History of CVA and PAD. Hyperlipidemia On Plavix  and aspirin  continue statins.   Anemia of chronic kidney disease. Latest hemoglobin of 10.4.  Nephrology on board.  DVT prophylaxis: heparin  injection 5,000 Units Start: 02/02/24 2200 SCDs Start: 02/01/24 1631   Disposition: Physical therapy has seen the patient and recommended skilled nursing facility placement.  Status is: Inpatient Remains inpatient appropriate because: Status post TMA, pending clinical improvement    Code Status:     Code Status: Full Code  Family Communication: None at bedside  Consultants: Podiatry Nephrology  Procedures: Revision TMA of the left foot on 02/02/2024  Anti-infectives:  Augmentin  and doxycycline   Anti-infectives (From admission, onward)    Start     Dose/Rate Route Frequency Ordered Stop   02/02/24 2200  amoxicillin -clavulanate (AUGMENTIN ) 500-125 MG per tablet 1 tablet        1 tablet Oral Daily at bedtime 02/02/24 1116 02/05/24 2159   02/02/24 2200  doxycycline  (VIBRA -TABS) tablet 100 mg        100 mg Oral Every 12 hours 02/02/24 1116 02/05/24 0959   02/01/24 1900  vancomycin  (VANCOREADY) IVPB 2000 mg/400 mL        2,000 mg 200 mL/hr over 120 Minutes Intravenous  Once 02/01/24 1804 02/02/24 0109   02/01/24 1900  cefTAZidime  (FORTAZ ) 1 g in sodium chloride  0.9 % 100 mL IVPB  Status:  Discontinued        1 g 200 mL/hr over 30 Minutes Intravenous  Every 24 hours 02/01/24 1804 02/02/24 1116        Subjective: Today, patient was seen and examined at bedside.  Complains of mild pain in the hip not controlled with Tylenol .  Wishes stronger medication.  Denies any shortness of breath nausea vomiting fever chills or rigor.  Has had bowel movements.  Objective: Vitals:   02/03/24 0341 02/03/24 0809  BP: (!) 112/52 (!) 104/52  Pulse: 81 81   Resp: 14 16  Temp: 97.6 F (36.4 C) 98.6 F (37 C)  SpO2: 94% 96%    Intake/Output Summary (Last 24 hours) at 02/03/2024 0930 Last data filed at 02/03/2024 0045 Gross per 24 hour  Intake 600 ml  Output 1600 ml  Net -1000 ml   Filed Weights   02/01/24 1800 02/02/24 0805 02/02/24 2056  Weight: 89.8 kg 89.9 kg 89.9 kg   Body mass index is 30.14 kg/m.   Physical Exam:  GENERAL: Patient is alert awake and oriented. Not in obvious distress. HENT: No scleral pallor or icterus. Pupils equally reactive to light. Oral mucosa is moist NECK: is supple, no gross swelling noted. CHEST: Clear to auscultation. No crackles or wheezes.   CVS: S1 and S2 heard, no murmur. Regular rate and rhythm.  ABDOMEN: Soft, non-tender, bowel sounds are present. EXTREMITIES: Left TMA with Ace bandage. CNS: Cranial nerves are intact. No focal motor deficits. SKIN: warm and dry, left foot with TMA with dressing.  Data Review: I have personally reviewed the following laboratory data and studies,  CBC: Recent Labs  Lab 02/01/24 2042 02/02/24 0631 02/03/24 0614  WBC 6.9 7.8 7.0  HGB 10.9* 10.4* 10.5*  HCT 32.9* 31.9* 32.4*  MCV 91.6 91.9 92.0  PLT 324 295 275   Basic Metabolic Panel: Recent Labs  Lab 02/01/24 2042 02/02/24 0631 02/03/24 0614  NA 136 136 135  K 4.5 5.0 4.6  CL 93* 94* 95*  CO2 31 29 28   GLUCOSE 159* 144* 139*  BUN 31* 36* 20  CREATININE 8.06* 8.79* 5.91*  CALCIUM  10.2 9.9 9.5  PHOS 5.0*  --   --    Liver Function Tests: Recent Labs  Lab 02/01/24 2042  ALBUMIN  3.7   No results for input(s): LIPASE, AMYLASE in the last 168 hours. No results for input(s): AMMONIA in the last 168 hours. Cardiac Enzymes: No results for input(s): CKTOTAL, CKMB, CKMBINDEX, TROPONINI in the last 168 hours. BNP (last 3 results) Recent Labs    10/19/23 1449  BNP 387.7*    ProBNP (last 3 results) No results for input(s): PROBNP in the last 8760 hours.  CBG: Recent  Labs  Lab 02/02/24 0808 02/02/24 0918 02/02/24 1110 02/02/24 1720 02/03/24 0622  GLUCAP 138* 127* 131* 135* 127*   Recent Results (from the past 240 hours)  Culture, blood (Routine X 2) w Reflex to ID Panel     Status: None (Preliminary result)   Collection Time: 02/01/24  8:41 PM   Specimen: BLOOD  Result Value Ref Range Status   Specimen Description BLOOD SITE NOT SPECIFIED  Final   Special Requests   Final    BOTTLES DRAWN AEROBIC AND ANAEROBIC Blood Culture adequate volume   Culture   Final    NO GROWTH 2 DAYS Performed at [facility], [address] [place], [place] [address]    Report Status PENDING  Incomplete  Culture, blood (Routine X 2) w Reflex to ID Panel     Status: None (Preliminary result)   Collection Time:  02/01/24  8:49 PM   Specimen: BLOOD  Result Value Ref Range Status   Specimen Description BLOOD SITE NOT SPECIFIED  Final   Special Requests   Final    BOTTLES DRAWN AEROBIC AND ANAEROBIC Blood Culture adequate volume   Culture   Final    NO GROWTH 2 DAYS Performed at [facility], [address] [place], [place] [address]    Report Status PENDING  Incomplete  MRSA Next Gen by PCR, Nasal     Status: None   Collection Time: 02/02/24  6:25 AM   Specimen: Nasal Mucosa; Nasal Swab  Result Value Ref Range Status   MRSA by PCR Next Gen NOT DETECTED NOT DETECTED Final    Comment: (NOTE) The GeneXpert MRSA Assay (FDA approved for NASAL specimens only), is one component of a comprehensive MRSA colonization surveillance program. It is not intended to diagnose MRSA infection nor to guide or monitor treatment for MRSA infections. Test performance is not FDA approved in patients less than 77 years old. Performed at [facility], [address] [place], [place] [address]   Aerobic/Anaerobic Culture w Gram Stain (surgical/deep wound)     Status: None (Preliminary result)   Collection Time: 02/02/24  8:51 AM   Specimen: PATH Bone biopsy;  Tissue  Result Value Ref Range Status   Specimen Description TISSUE  Final   Special Requests AMPUTATION FOOT LEFT TRANSMETATARSAL  Final   Gram Stain   Final    MODERATE WBC PRESENT, PREDOMINANTLY PMN NO ORGANISMS SEEN    Culture   Final    FEW STAPHYLOCOCCUS AUREUS SUSCEPTIBILITIES TO FOLLOW Performed at [facility], [address], [place], [place] [address]    Report Status PENDING  Incomplete     Studies: DG Foot 2 Views Left Result Date: 02/02/2024 CLINICAL DATA:  Postop. EXAM: LEFT FOOT - 2 VIEW COMPARISON:  Radiograph yesterday FINDINGS: Interval resection of the metatarsals. There may be partial resection of the tarsal bones, not well assessed due to positioning. Expected postoperative changes in the overlying soft tissues with skin staples in place. IMPRESSION: Interval resection of the metatarsals. Electronically Signed   By: Andrea Gasman M.D.   On: 02/02/2024 16:03   MR FOOT LEFT WO CONTRAST Result Date: 02/02/2024 CLINICAL DATA:  Left foot wound status post transmetatarsal amputation in September 2025. Concern for osteomyelitis. EXAM: MRI OF THE LEFT FOOT WITHOUT CONTRAST TECHNIQUE: Multiplanar, multisequence MR imaging of the left foot was performed. No intravenous contrast was administered. COMPARISON:  Earlier same day radiographs of the left foot. MRI left foot dated 10/21/2023. FINDINGS: Despite efforts by the technologist and patient, motion artifact is present on today's exam and could not be eliminated. This reduces exam sensitivity and specificity. Soft tissue/Bones/Joint/Cartilage Status post transmetatarsal amputation of the first through fifth rays to the level of the proximal metatarsal shafts. Suspected sinus tract with fluid signal at the dorsal lateral amputation stump at the level of the remnant proximal fifth metatarsal measuring up to 2 mm in thickness extends to the transsection margin of the remnant proximal fifth metatarsal with associated marrow  edema, concerning for osteomyelitis. Marrow edema with indistinctness of the transsection margin and shaft of the proximal remnant first metatarsal is concerning for osteomyelitis. Less pronounced edema like signal of the transsection margins of the remnant proximal second, third, and fourth metatarsals without appreciable corresponding T1 hypointensity could reflect reactive marrow changes, however, osteomyelitis can not be excluded. No additional acute osseous abnormality  identified. Mild-to-moderate degenerative changes of the second TMT joint and mild degenerative changes elsewhere throughout the midfoot. No significant joint effusion. Ligaments Lisfranc ligament appears intact. Muscles and Tendons Postoperative changes related to transmetatarsal amputation. No significant tenosynovial fluid collection. Soft tissue No drainable abscess. Cutaneous thickening along the dorsal and lateral amputation stump. IMPRESSION: 1. Postoperative changes related to transmetatarsal amputation of the first through fifth rays to the level of the proximal metatarsal shafts. Suspected sinus tract with fluid signal measuring up to 2 mm in thickness and surrounding inflammatory change at the dorsal lateral amputation stump extends to the transsection margin of the remnant proximal fifth metatarsal with associated marrow edema, concerning for osteomyelitis. Marrow edema with indistinctness of the transsection margin and shaft of the proximal remnant first metatarsal is concerning for osteomyelitis. 2. Less pronounced edema like signal of the transsection margins of the remnant proximal second, third, and fourth metatarsals without appreciable corresponding T1 hypointensity could reflect reactive marrow changes, however, osteomyelitis can not be excluded. 3. No drainable abscess. Electronically Signed   By: Harrietta Sherry M.D.   On: 02/02/2024 09:29   DG Chest Port 1 View Result Date: 02/01/2024 EXAM: 1 VIEW(S) XRAY OF THE CHEST  02/01/2024 05:38:00 PM COMPARISON: 01/16/2024 CLINICAL HISTORY: Cough FINDINGS: LUNGS AND PLEURA: Low lung volumes with bronchovascular crowding. Left basilar linear opacities consistent with atelectasis. No pleural effusion. No pneumothorax. HEART AND MEDIASTINUM: No acute abnormality of the cardiac and mediastinal silhouettes. BONES AND SOFT TISSUES: No acute osseous abnormality. IMPRESSION: 1. No acute findings. Electronically signed by: Greig Pique MD MD 02/01/2024 08:07 PM EST RP Workstation: [id number]   DG Foot Complete Left Result Date: 02/01/2024 Please see detailed radiograph report in office note.     Sunshyne Horvath, MD  [facility] 02/03/2024  If 7PM-7AM, please contact night-coverage             "

## 2024-02-03 NOTE — Progress Notes (Signed)
 " Edgar Brewer Progress Note   Subjective:   Patient seen and examined at bedside. States I would be doing great if people didn't come in and out of my room all the time.  Tolerating dialysis.  Pain in foot tolerable.  No better, no worse. Denies CP, SOB, abdominal pain and n/v/d.   Objective Vitals:   02/03/24 0146 02/03/24 0341 02/03/24 0809 02/03/24 1404  BP: (!) 115/55 (!) 112/52 (!) 104/52 112/76  Pulse: 81 81 81 92  Resp: 18 14 16 17   Temp: 98.6 F (37 C) 97.6 F (36.4 C) 98.6 F (37 C) 98.3 F (36.8 C)  TempSrc: Oral Oral Oral Oral  SpO2: 94% 94% 96% 100%  Weight:      Height:       Physical Exam General:chronically ill appearing male in NAD Heart:RRR, no mrg Lungs:CTAB, nml WOB on RA Abdomen:soft, round, non tender Extremities:no LE edema Dialysis Access: LU AVF   Filed Weights   02/01/24 1800 02/02/24 0805 02/02/24 2056  Weight: 89.8 kg 89.9 kg 89.9 kg    Intake/Output Summary (Last 24 hours) at 02/03/2024 1645 Last data filed at 02/03/2024 1500 Gross per 24 hour  Intake 1080 ml  Output 1600 ml  Net -520 ml    Additional Objective Labs: Basic Metabolic Panel: Recent Labs  Lab 02/01/24 2042 02/02/24 0631 02/03/24 0614  NA 136 136 135  K 4.5 5.0 4.6  CL 93* 94* 95*  CO2 31 29 28   GLUCOSE 159* 144* 139*  BUN 31* 36* 20  CREATININE 8.06* 8.79* 5.91*  CALCIUM  10.2 9.9 9.5  PHOS 5.0*  --   --    Liver Function Tests: Recent Labs  Lab 02/01/24 2042  ALBUMIN  3.7   CBC: Recent Labs  Lab 02/01/24 2042 02/02/24 0631 02/03/24 0614  WBC 6.9 7.8 7.0  HGB 10.9* 10.4* 10.5*  HCT 32.9* 31.9* 32.4*  MCV 91.6 91.9 92.0  PLT 324 295 275   Blood Culture    Component Value Date/Time   SDES TISSUE 02/02/2024 0851   SPECREQUEST AMPUTATION FOOT LEFT TRANSMETATARSAL 02/02/2024 0851   CULT  02/02/2024 0851    FEW STAPHYLOCOCCUS AUREUS SUSCEPTIBILITIES TO FOLLOW Performed at [facility], [address], [place], [place]  [address]    REPTSTATUS PENDING 02/02/2024 9148   Studies/Results: DG Foot 2 Views Left Result Date: 02/02/2024 CLINICAL DATA:  Postop. EXAM: LEFT FOOT - 2 VIEW COMPARISON:  Radiograph yesterday FINDINGS: Interval resection of the metatarsals. There may be partial resection of the tarsal bones, not well assessed due to positioning. Expected postoperative changes in the overlying soft tissues with skin staples in place. IMPRESSION: Interval resection of the metatarsals. Electronically Signed   By: Edgar Brewer M.D.   On: 02/02/2024 16:03   MR FOOT LEFT WO CONTRAST Result Date: 02/02/2024 CLINICAL DATA:  Left foot wound status post transmetatarsal amputation in September 2025. Concern for osteomyelitis. EXAM: MRI OF THE LEFT FOOT WITHOUT CONTRAST TECHNIQUE: Multiplanar, multisequence MR imaging of the left foot was performed. No intravenous contrast was administered. COMPARISON:  Earlier same day radiographs of the left foot. MRI left foot dated 10/21/2023. FINDINGS: Despite efforts by the technologist and patient, motion artifact is present on today's exam and could not be eliminated. This reduces exam sensitivity and specificity. Soft tissue/Bones/Joint/Cartilage Status post transmetatarsal amputation of the first through fifth rays to the level of the proximal metatarsal shafts. Suspected sinus tract with fluid signal at the dorsal lateral amputation stump at the  level of the remnant proximal fifth metatarsal measuring up to 2 mm in thickness extends to the transsection margin of the remnant proximal fifth metatarsal with associated marrow edema, concerning for osteomyelitis. Marrow edema with indistinctness of the transsection margin and shaft of the proximal remnant first metatarsal is concerning for osteomyelitis. Less pronounced edema like signal of the transsection margins of the remnant proximal second, third, and fourth metatarsals without appreciable corresponding T1 hypointensity could reflect  reactive marrow changes, however, osteomyelitis can not be excluded. No additional acute osseous abnormality identified. Mild-to-moderate degenerative changes of the second TMT joint and mild degenerative changes elsewhere throughout the midfoot. No significant joint effusion. Ligaments Lisfranc ligament appears intact. Muscles and Tendons Postoperative changes related to transmetatarsal amputation. No significant tenosynovial fluid collection. Soft tissue No drainable abscess. Cutaneous thickening along the dorsal and lateral amputation stump. IMPRESSION: 1. Postoperative changes related to transmetatarsal amputation of the first through fifth rays to the level of the proximal metatarsal shafts. Suspected sinus tract with fluid signal measuring up to 2 mm in thickness and surrounding inflammatory change at the dorsal lateral amputation stump extends to the transsection margin of the remnant proximal fifth metatarsal with associated marrow edema, concerning for osteomyelitis. Marrow edema with indistinctness of the transsection margin and shaft of the proximal remnant first metatarsal is concerning for osteomyelitis. 2. Less pronounced edema like signal of the transsection margins of the remnant proximal second, third, and fourth metatarsals without appreciable corresponding T1 hypointensity could reflect reactive marrow changes, however, osteomyelitis can not be excluded. 3. No drainable abscess. Electronically Signed   By: Edgar Brewer M.D.   On: 02/02/2024 09:29   DG Chest Port 1 View Result Date: 02/01/2024 EXAM: 1 VIEW(S) XRAY OF THE CHEST 02/01/2024 05:38:00 PM COMPARISON: 01/16/2024 CLINICAL HISTORY: Cough FINDINGS: LUNGS AND PLEURA: Low lung volumes with bronchovascular crowding. Left basilar linear opacities consistent with atelectasis. No pleural effusion. No pneumothorax. HEART AND MEDIASTINUM: No acute abnormality of the cardiac and mediastinal silhouettes. BONES AND SOFT TISSUES: No acute osseous  abnormality. IMPRESSION: 1. No acute findings. Electronically signed by: Edgar Pique MD MD 02/01/2024 08:07 PM EST RP Workstation: [id number]    Medications:   amoxicillin -clavulanate  1 tablet Oral QHS   aspirin  EC  81 mg Oral Daily   Chlorhexidine  Gluconate Cloth  6 each Topical Q0600   cinacalcet   60 mg Oral Q T,Th,Sat-1800   clopidogrel   75 mg Oral Daily   doxycycline   100 mg Oral Q12H   feeding supplement (NEPRO CARB STEADY)  237 mL Oral BID BM   gabapentin   100 mg Oral TID   heparin  injection (subcutaneous)  5,000 Units Subcutaneous Q8H   insulin  aspart  0-6 Units Subcutaneous TID WC   insulin  glargine-yfgn  15 Units Subcutaneous Daily   multivitamin  1 tablet Oral QHS   pantoprazole   40 mg Oral Daily   rosuvastatin   10 mg Oral Daily   sevelamer  carbonate  1,600 mg Oral TID WC    Dialysis Orders: MWF NW 4h  B400   87kg  2K  AVF  Hep 2600 Last OP HD 1/07, post wt 88.6kg Usually gets close to dry wt, usual UF 1-4kg   Assessment/Plan: 1. L foot osteomyelitis with wound dehiscence - s/p revision of TMA on 02/02/24.  On Augmentin  and doxycycline . 2. ESRD - on HD MWF.  Next HD on 02/05/24. 3. Anemia of CKD- Hgb 10.5. No indication for ESA. 4. Secondary hyperparathyroidism - Ca in goal. Check phos with next HD.  Continue home meds. 5. HTN/volume -  Blood pressure in goal. Does not appear overloaded. UF as tolerated.  6. Nutrition - Renal diet w/fluid restrictions.   Edgar Labella, PA-C [facility] 02/03/2024,4:45 PM  LOS: 2 days    "

## 2024-02-03 NOTE — Progress Notes (Signed)
 AVS Reviewed.  All questions answered.  Patient awaiting ride that would be here around 630p.

## 2024-02-03 NOTE — TOC Progression Note (Signed)
 Transition of Care [facility]) - Progression Note    Patient Details  Name: Edgar Brewer MRN: [id number] Date of Birth: November 07, 1960  Transition of Care [facility]) CM/SW Contact  Robynn Eileen Hoose, RN Phone Number: 02/03/2024, 12:02 PM  Clinical Narrative:   [facility] accepted patient for [facility] services. Will need HH orders with [facility].    Expected Discharge Plan: Home w Home Health Services Barriers to Discharge: Continued Medical Work up               Expected Discharge Plan and Services       Living arrangements for the past 2 months: Single Family Home                           HH Arranged: PT HH Agency: [facility] ([facility]) Date HH Agency Contacted: 02/03/24 Time HH Agency Contacted: 1201 Representative spoke with at [facility] Agency: [facility]   Social Drivers of Health (SDOH) Interventions SDOH Screenings   Food Insecurity: No Food Insecurity (02/01/2024)  Housing: Low Risk (02/01/2024)  Transportation Needs: No Transportation Needs (02/01/2024)  Utilities: Not At Risk (02/01/2024)  Alcohol Screen: Low Risk (01/11/2024)  Depression (PHQ2-9): Low Risk (01/15/2024)  Financial Resource Strain: Low Risk (04/19/2022)  Physical Activity: Inactive (01/11/2024)  Social Connections: Unknown (02/01/2024)  Recent Concern: Social Connections - Socially Isolated (01/11/2024)  Stress: No Stress Concern Present (01/11/2024)  Tobacco Use: Medium Risk (02/02/2024)  Health Literacy: Adequate Health Literacy (01/11/2024)    Readmission Risk Interventions    01/18/2024    4:01 PM 10/20/2023    4:09 PM  Readmission Risk Prevention Plan  Transportation Screening Complete Complete  PCP or Specialist Appt within 3-5 Days Complete Complete  HRI or Home Care Consult Complete Complete  Social Work Consult for Recovery Care Planning/Counseling Complete Complete  Palliative Care Screening Not Applicable Not Applicable  Medication Review Oceanographer) Complete

## 2024-02-03 NOTE — Evaluation (Signed)
 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     Clinical Impressions Prior to this admission, patient living with family and having mother and brother help him to dialysis. Patient has 4 STE, and occasionally gets in the shower but often sponge bathes. Currently, patient in pain in L foot, and with flat affect and agitated. Patient CGA with ADL management, and able to complete sit<>stand with min A and improved ability to maintain weight bearing precautions. OT recommending rehab of lesser instensity < 3 hours, however patient will likely refuse. OT will continue to follow acutely.      If plan is discharge home, recommend the following:   A little help with walking and/or transfers;A little help with bathing/dressing/bathroom;Assistance with cooking/housework;Direct supervision/assist for medications management;Direct supervision/assist for financial management;Assist for transportation;Help with stairs or ramp for entrance     Functional Status Assessment   Patient has had a recent decline in their functional status and demonstrates the ability to make significant improvements in function in a reasonable and predictable amount of time.     Equipment Recommendations   Other (comment) (defer to next venue)     Recommendations for Other Services         Precautions/Restrictions   Precautions Precautions: Fall Recall of Precautions/Restrictions: Intact Required Braces or Orthoses: Other Brace Other Brace: post-op shoe L LE Restrictions Weight  Bearing Restrictions Per Provider Order: Yes LLE Weight Bearing Per Provider Order: Non weight bearing     Mobility Bed Mobility Overal bed mobility: Modified Independent                  Transfers Overall transfer level: Needs assistance Equipment used: Rolling walker (2 wheels) Transfers: Sit to/from Stand Sit to Stand: Min assist           General transfer comment: min A to complete sit<>stand and take a hop towards the [facility]      Balance Overall balance assessment: Needs assistance Sitting-balance support: No upper extremity supported, Feet supported Sitting balance-Leahy Scale: Good     Standing balance support: Reliant on assistive device for balance, During functional activity, Bilateral upper extremity supported Standing balance-Leahy Scale: Poor Standing balance comment: reliant on RW                           ADL either performed or assessed with clinical judgement   ADL Overall ADL's : Needs assistance/impaired Eating/Feeding: Set up;Sitting   Grooming: Set up;Sitting   Upper Body Bathing: Set up;Sitting   Lower Body Bathing: Contact guard assist;Sitting/lateral leans;Sit to/from stand   Upper Body Dressing : Set up;Sitting   Lower Body Dressing: Contact guard assist;Sit to/from stand;Sitting/lateral leans   Toilet Transfer: Minimal assistance;BSC/3in1;Rolling walker (2 wheels) Toilet Transfer Details (indicate cue type and reason): stand pivot transfer simulated Toileting- Clothing Manipulation and Hygiene: Contact guard assist;Sitting/lateral lean;Sit to/from stand       Functional mobility during ADLs: Contact guard assist;Cueing for sequencing;Cueing for safety;Rolling walker (2 wheels) General ADL Comments: Prior to this admission, patient living with family and having  mother and brother help him to dialysis. Patient has 4 STE, and occasionally gets in the shower but often sponge bathes. Currently, patient in pain in L foot, and  with flat affect and agitated. Patient CGA with ADL management, and able to complete sit<>stand with min A and improved ability to maintain weight bearing precautions. OT recommending rehab of lesser instensity < 3 hours, however patient will likely refuse. OT will continue to follow acutely.     Vision Baseline Vision/History: 1 Wears glasses Ability to See in Adequate Light: 0 Adequate Patient Visual Report: No change from baseline Vision Assessment?: No apparent visual deficits     Perception Perception: Not tested       Praxis Praxis: Not tested       Pertinent Vitals/Pain Pain Assessment Pain Assessment: Faces Faces Pain Scale: Hurts even more Pain Location: L foot Pain Descriptors / Indicators: Grimacing, Guarding, Discomfort Pain Intervention(s): Limited activity within patient's tolerance, Monitored during session, Repositioned     Extremity/Trunk Assessment Upper Extremity Assessment Upper Extremity Assessment: Right hand dominant;Overall WFL for tasks assessed (Nueropathy bilaterally)   Lower Extremity Assessment Lower Extremity Assessment: Defer to PT evaluation   Cervical / Trunk Assessment Cervical / Trunk Assessment: Normal   Communication Communication Communication: No apparent difficulties Factors Affecting Communication: Hearing impaired   Cognition Arousal: Alert Behavior During Therapy: Agitated, WFL for tasks assessed/performed, Flat affect Cognition: Difficult to assess             OT - Cognition Comments: Tangential and grumpy, minimally willing to participate                 Following commands: Impaired Following commands impaired: Follows one step commands with increased time     Cueing  General Comments   Cueing Techniques: Verbal cues  VSS on RA   Exercises     Shoulder Instructions      Home Living Family/patient expects to be discharged to:: Private residence Living Arrangements: Parent Available Help at  Discharge: Family Type of Home: House Home Access: Stairs to enter Secretary/administrator of Steps: 4 Entrance Stairs-Rails: Right Home Layout: One level     Bathroom Shower/Tub: Chief Strategy Officer: Standard     Home Equipment: Cane - quad;Shower seat;Transport Restaurant Manager, [employer] (2 wheels)          Prior Functioning/Environment Prior Level of Function : Needs assist             Mobility Comments: family help him in wheelchair though he walks down the stairs to get to and from dialysis. ADLs Comments: mother/brother help to get to dialysis, has been sponge bathing    OT Problem List: Decreased strength;Decreased range of motion;Decreased activity tolerance;Decreased coordination;Decreased cognition;Decreased safety awareness;Decreased knowledge of use of DME or AE;Decreased knowledge of precautions;Impaired sensation;Pain   OT Treatment/Interventions: Self-care/ADL training;Therapeutic exercise;Energy conservation;DME and/or AE instruction;Manual therapy;Therapeutic activities;Patient/family education;Balance training      OT Goals(Current goals can be found in the care plan section)   Acute Rehab OT Goals Patient Stated Goal: to get out of here OT Goal Formulation: With patient Time For Goal Achievement: 02/17/24 Potential to Achieve Goals: Fair ADL Goals Pt Will Perform Lower Body Bathing: with modified independence;sit to/from stand;sitting/lateral leans Pt Will Perform Lower Body Dressing: with modified independence;sitting/lateral leans;sit to/from stand Pt Will Transfer to Toilet: with modified independence;ambulating;regular height toilet Pt Will Perform Toileting - Clothing Manipulation and hygiene: with modified independence;sitting/lateral leans;sit to/from stand Additional ADL Goal #1: Patient will be  able to adhere to WB precautions throughout session without cues for safety. Additional ADL Goal #2: Patient will be able to complete  functional task in standing for 3-5 minutes prior to needing seated rest break in order to increase overall activity tolerance.   OT Frequency:  Min 2X/week    Co-evaluation              AM-PAC OT 6 Clicks Daily Activity     Outcome Measure Help from another person eating meals?: A Little Help from another person taking care of personal grooming?: A Little Help from another person toileting, which includes using toliet, bedpan, or urinal?: A Little Help from another person bathing (including washing, rinsing, drying)?: A Little Help from another person to put on and taking off regular upper body clothing?: A Little Help from another person to put on and taking off regular lower body clothing?: A Little 6 Click Score: 18   End of Session Equipment Utilized During Treatment: Rolling walker (2 wheels);Other (comment) (Post op shoe) Nurse Communication: Mobility status;Patient requests pain meds  Activity Tolerance: Patient tolerated treatment well Patient left: in bed;with call bell/phone within reach;with nursing/sitter in room (sitting EOB with NT)  OT Visit Diagnosis: Unsteadiness on feet (R26.81);Repeated falls (R29.6);Muscle weakness (generalized) (M62.81);History of falling (Z91.81);Other abnormalities of gait and mobility (R26.89);Other symptoms and signs involving cognitive function;Pain Pain - Right/Left: Left Pain - part of body: Ankle and joints of foot                Time: 1341-1403 OT Time Calculation (min): 22 min Charges:  OT General Charges $OT Visit: 1 Visit OT Evaluation $OT Eval Moderate Complexity: 1 Mod  Ronal Gift E. Kassim Guertin, OTR/L Acute Rehabilitation Services [phone]   Ronal Gift Salt 02/03/2024, 2:17 PM

## 2024-02-03 NOTE — TOC Progression Note (Addendum)
 Transition of Care [facility]) - Progression Note    Patient Details  Name: Edgar Brewer MRN: [id number] Date of Birth: 1960/05/24  Transition of Care [facility]) CM/SW Contact  Sherline Clack, [place] Phone Number: 02/03/2024, 11:15 AM  Clinical Narrative:     CSW spoke with family and patient at bedside regarding anticipated discharge plan. CSW discussed SNF process with patient and family but they expressed interest in returning home with home health. Family stated they do not have preference for home health agency. CSW reached out to [facility] to help set up home health. CSW will continue to follow as needs arise.   Expected Discharge Plan: Home w Home Health Services Barriers to Discharge: Continued Medical Work up               Expected Discharge Plan and Services       Living arrangements for the past 2 months: Single Family Home                                       Social Drivers of Health (SDOH) Interventions SDOH Screenings   Food Insecurity: No Food Insecurity (02/01/2024)  Housing: Low Risk (02/01/2024)  Transportation Needs: No Transportation Needs (02/01/2024)  Utilities: Not At Risk (02/01/2024)  Alcohol Screen: Low Risk (01/11/2024)  Depression (PHQ2-9): Low Risk (01/15/2024)  Financial Resource Strain: Low Risk (04/19/2022)  Physical Activity: Inactive (01/11/2024)  Social Connections: Unknown (02/01/2024)  Recent Concern: Social Connections - Socially Isolated (01/11/2024)  Stress: No Stress Concern Present (01/11/2024)  Tobacco Use: Medium Risk (02/02/2024)  Health Literacy: Adequate Health Literacy (01/11/2024)    Readmission Risk Interventions    01/18/2024    4:01 PM 10/20/2023    4:09 PM  Readmission Risk Prevention Plan  Transportation Screening Complete Complete  PCP or Specialist Appt within 3-5 Days Complete Complete  HRI or Home Care Consult Complete Complete  Social Work Consult for Recovery Care Planning/Counseling Complete Complete   Palliative Care Screening Not Applicable Not Applicable  Medication Review Oceanographer) Complete

## 2024-02-03 NOTE — Progress Notes (Signed)
 Physical Therapy Treatment Patient Details Name: Edgar Brewer MRN: [id number] DOB: [birth year] Today's Date: 02/03/2024   History of Present Illness Patient is a 64 y/o male admitted 02/01/24 direct admit from MD office with osteomyelitis with draining sinus L foot TMA site.  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    PT Comments  Pt seated edge of bed on arrival.  He is adamant he will not be going to a SNF and Home is his only option.  He will need HHPT set up at d/c.  Pt require more education on weight bearing status and the importance of protecting the integrity of his residual foot.  Pt attempted the knee walker device this session but he does not feel confident in his abilities to use this device.  Performed the rest of mobility with RW today and his was able to maintain weight bearing.  Pt is ready to d/c  home and very eager to get better.  He declined stair training and reports he has a system in place,  Will inform supervising PT of need for change in recommendations based on progressed and improved consistency this session.      If plan is discharge home, recommend the following: A little help with walking and/or transfers;A little help with bathing/dressing/bathroom;Help with stairs or ramp for entrance;Assist for transportation;Assistance with cooking/housework   Can travel by private vehicle     Yes  Equipment Recommendations  None recommended by PT    Recommendations for Other Services       Precautions / Restrictions Precautions Precautions: Fall Recall of Precautions/Restrictions: Intact Required Braces or Orthoses: Other Brace Other Brace: post-op shoe L LE Restrictions Weight Bearing Restrictions Per Provider Order: Yes LLE Weight Bearing Per Provider Order: Non weight bearing     Mobility  Bed Mobility                General bed mobility comments: Pt seated at edge of bed at end of session with good balance.    Transfers Overall transfer level: Needs assistance Equipment used: Rolling walker (2 wheels) Transfers: Sit to/from Stand Sit to Stand: Contact guard assist           General transfer comment: Cues for hand placement to and from seated surface.  Performed additional trials to knee walker for sit to stand but he did not feel comfortable with this device so deferred back to RW for all transfers.    Ambulation/Gait Ambulation/Gait assistance: Supervision Gait Distance (Feet): 24 Feet Assistive device: Rolling walker (2 wheels) (attempted with knee scooter but patient did not feel confident in his ability to use this.  He was getting frustrated at the learning curve of using a new device.) Gait Pattern/deviations: Step-to pattern, Decreased stride length, Trunk flexed       General Gait Details: Pt required cues for posture and maintainin L NWB.  He did a better job at managing this but gets noticeably fatigued when doing correctly educated on self assessment and having transport chair close by for when breaks are needed.   Stairs             Wheelchair Mobility     Tilt Bed    Modified Rankin (Stroke Patients Only)       Balance Overall balance assessment: Needs assistance Sitting-balance support: No upper extremity supported, Feet supported Sitting  balance-Leahy Scale: Good       Standing balance-Leahy Scale: Poor Standing balance comment: reliant on RW                            Communication Communication Communication: No apparent difficulties Factors Affecting Communication: Hearing impaired  Cognition Arousal: Alert Behavior During Therapy: Agitated, WFL for tasks assessed/performed, Flat affect   PT - Cognitive impairments: Problem solving, Safety/Judgement                       PT - Cognition Comments: Pt continues to  be agitated in regards to therapy and his current condition.  PTA educated mother, brother and patient on the importance of maintaining L NWB in order for foot to heal appropriately this time. Pt and family agreeable to comply with precautions.        Cueing    Exercises      General Comments General comments (skin integrity, edema, etc.): VSS on RA      Pertinent Vitals/Pain Pain Assessment Pain Assessment: Faces Faces Pain Scale: Hurts a little bit Pain Location: L foot Pain Descriptors / Indicators: Discomfort Pain Intervention(s): Monitored during session, Repositioned    Home Living Family/patient expects to be discharged to:: Private residence Living Arrangements: Parent Available Help at Discharge: Family Type of Home: House Home Access: Stairs to enter Entrance Stairs-Rails: Right Entrance Stairs-Number of Steps: 4   Home Layout: One level Home Equipment: Cane - quad;Shower seat;Transport Restaurant Manager, [employer] (2 wheels)      Prior Function            PT Goals (current goals can now be found in the care plan section) Acute Rehab PT Goals Patient Stated Goal: heal the foot Potential to Achieve Goals: Good Progress towards PT goals: Progressing toward goals    Frequency    Min 3X/week      PT Plan      Co-evaluation              AM-PAC PT 6 Clicks Mobility   Outcome Measure  Help needed turning from your back to your side while in a flat bed without using bedrails?: None Help needed moving from lying on your back to sitting on the side of a flat bed without using bedrails?: None Help needed moving to and from a bed to a chair (including a wheelchair)?: A Little Help needed standing up from a chair using your arms (e.g., wheelchair or bedside chair)?: A Little Help needed to walk in hospital room?: A Little Help needed climbing 3-5 steps with a railing? : Total 6 Click Score: 18    End of Session Equipment Utilized During Treatment: Gait  belt Activity Tolerance: Patient tolerated treatment well Patient left: in bed;with call bell/phone within reach Nurse Communication: Mobility status PT Visit Diagnosis: Other abnormalities of gait and mobility (R26.89);History of falling (Z91.81)     Time: 8781-8756 PT Time Calculation (min) (ACUTE ONLY): 25 min  Charges:    $Gait Training: 8-22 mins $Therapeutic Activity: 8-22 mins PT General Charges $$ ACUTE PT VISIT: 1 Visit                     Toya HAMS , PTA Acute Rehabilitation Services Office [phone]    Toya JINNY Gosling 02/03/2024, 3:16 PM

## 2024-02-03 NOTE — Discharge Planning (Signed)
 [facility] Patient Discharge Orders- [facility] CLINIC: [place]  Patient's name: Khristopher Kapaun Admit/DC Dates: 02/01/2024 - 02/03/24  Discharge Diagnoses: L foot osteomyelitis w/wound dehiscence s/p revision of TMA on 02/02/24    HD ORDER CHANGES: Heparin  change: no EDW Change: no Bath Change: no       ANEMIA MANAGEMENT: Aranesp : Given: no    PRBC's Given: no  ESA dose for discharge: none IV Iron dose at discharge:none   BONE/MINERAL MEDICATIONS: Hectorol /Calcitriol change: no Sensipar /Parsabiv change: no   ACCESS INTERVENTION/CHANGE: no Details:   RECENT LABS: Recent Labs  Lab 02/01/24 2042 02/02/24 0631 02/03/24 0614  K 4.5   < > 4.6  CALCIUM  10.2   < > 9.5  ALBUMIN  3.7  --   --   PHOS 5.0*  --   --    < > = values in this interval not displayed.   Recent Labs  Lab 02/03/24 0614  HGB 10.5*   Blood Culture    Component Value Date/Time   SDES TISSUE 02/02/2024 0851   SPECREQUEST AMPUTATION FOOT LEFT TRANSMETATARSAL 02/02/2024 0851   CULT  02/02/2024 0851    FEW STAPHYLOCOCCUS AUREUS SUSCEPTIBILITIES TO FOLLOW Performed at [facility], [address], [place], [place] [address]    REPTSTATUS PENDING 02/02/2024 [phone]       IV ANTIBIOTICS: no Details: PO Augmentin  500mg  BID and doxycycline  100mg  BID x 7d   OTHER ANTICOAGULATION:  On Eliquis: no On Coumadin: no   OTHER/APPTS/LAB ORDERS:     D/C Meds to be reconciled by nurse after every discharge.  Completed By: Manuelita Labella PA-C   Reviewed by: MD:______ RN_______

## 2024-02-03 NOTE — Progress Notes (Signed)
 "  Subjective:  Patient ID: Edgar Brewer, male    DOB: 12-31-1960,  MRN: [id number]  No chief complaint on file.   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 male returns for post-op check.  He states he is doing okay.  He states he is having some pain with burning shooting tingling to his foot.  Denies any other acute complaints motor or sensory functions are intact no nausea fever chills vomiting nonweightbearing to the left lower extremity  Review of Systems: Negative except as noted in the HPI. Denies N/V/F/Ch.  Past Medical History:  Diagnosis Date   Acute on chronic combined systolic and diastolic CHF, NYHA class 1 (HCC) 01/13/2015   Allergy    Arthritis    Chronic kidney disease    [facility].   Diabetes mellitus type 2 with complications (HCC)    Previous insulin , non-insulin  requiring noted 09/2018.  Complications include retinopathy, peripheral neuropathy.   GERD (gastroesophageal reflux disease)    at times   Head injury with loss of consciousness [facility])    History of kidney stones    Hypertension    Neuromuscular disorder (HCC)    Neuropathy    Pulmonary hypertension (HCC)    PA peak pressure 41 mmHg 03/04/16 echo   Stroke [facility])    no residual weakness   Varicose veins of lower extremities with complications 12/24/2014   Current Medications[1]  Tobacco Use History[2]  Allergies[3] Objective:   Vitals:   02/03/24 0341 02/03/24 0809  BP: (!) 112/52 (!) 104/52  Pulse: 81 81  Resp: 14 16  Temp: 97.6 F (36.4 C) 98.6 F (37 C)  SpO2: 94% 96%   Body mass index is 30.14 kg/m. Constitutional Well developed. Well nourished.  Vascular Foot warm and well perfused. Capillary refill normal to all digits.   Neurologic Normal speech. Oriented to person, place, and time. Epicritic sensation to light touch grossly present bilaterally.  Dermatologic Bandages clean dry and intact.  No strikethrough noted.  Motor  sensor functions are intact no calf pain.  Orthopedic: Tenderness to palpation noted about the surgical site.   Radiographs: FINDINGS: Interval resection of the metatarsals. There may be partial resection of the tarsal bones, not well assessed due to positioning. Expected postoperative changes in the overlying soft tissues with skin staples in place.   IMPRESSION: Interval resection of the metatarsals. Assessment:  No diagnosis found. Plan:  Patient was evaluated and treated and all questions answered.  S/p foot surgery left -Progressing as expected post-operatively. -XR: See above -WB Status: Nonweightbearing to the left lower extremity and postop shoe - Patient is okay to be discharged from podiatric standpoint.  Bandages clean dry and intact.  He is okay to be discharged on antibiotics for 7 days as he is expected clean margin.  Antibiotics per infectious disease - No dressing change until follow-up - Follow-up 1 week from discharge  No follow-ups on file.      [1]  Current Facility-Administered Medications:    acetaminophen  (TYLENOL ) tablet 650 mg, 650 mg, Oral, Q6H PRN, 650 mg at 02/03/24 0616 **OR** acetaminophen  (TYLENOL ) suppository 650 mg, 650 mg, Rectal, Q6H PRN, Elgergawy, Dawood S, MD   albuterol  (PROVENTIL ) (2.5 MG/3ML) 0.083% nebulizer solution 2.5 mg, 2.5 mg, Nebulization, Q2H PRN, Elgergawy, Dawood S, MD   amoxicillin -clavulanate (AUGMENTIN ) 500-125 MG per tablet 1 tablet, 1 tablet, Oral, QHS, Standiford, Marsa FALCON, DPM, 1 tablet at 02/03/24 0134   aspirin  EC tablet 81 mg,  81 mg, Oral, Daily, Elgergawy, Dawood S, MD, 81 mg at 02/03/24 0848   Chlorhexidine  Gluconate Cloth 2 % PADS 6 each, 6 each, Topical, Q0600, Geralynn Charleston, MD, 6 each at 02/03/24 [phone]   cinacalcet  (SENSIPAR ) tablet 60 mg, 60 mg, Oral, Q T,Th,Sat-1800, Hindman, Katherine G, RPH, 60 mg at 02/01/24 2110   clopidogrel  (PLAVIX ) tablet 75 mg, 75 mg, Oral, Daily, Elgergawy, Dawood S, MD, 75 mg at  02/03/24 0848   docusate sodium  (COLACE) capsule 100 mg, 100 mg, Oral, BID PRN, Elgergawy, Dawood S, MD   doxycycline  (VIBRA -TABS) tablet 100 mg, 100 mg, Oral, Q12H, Standiford, Alexander F, DPM, 100 mg at 02/03/24 0848   feeding supplement (NEPRO CARB STEADY) liquid 237 mL, 237 mL, Oral, BID BM, Pokhrel, Laxman, MD   gabapentin  (NEURONTIN ) capsule 100 mg, 100 mg, Oral, TID, Elgergawy, Dawood S, MD, 100 mg at 02/03/24 0848   heparin  injection 5,000 Units, 5,000 Units, Subcutaneous, Q8H, Standiford, Marsa FALCON, DPM, 5,000 Units at 02/03/24 1300   hydrALAZINE  (APRESOLINE ) injection 5 mg, 5 mg, Intravenous, Q4H PRN, Elgergawy, Brayton RAMAN, MD   insulin  aspart (novoLOG ) injection 0-6 Units, 0-6 Units, Subcutaneous, TID WC, Elgergawy, Dawood S, MD, 2 Units at 02/03/24 1259   insulin  glargine-yfgn (SEMGLEE ) injection 15 Units, 15 Units, Subcutaneous, Daily, Elgergawy, Dawood S, MD, 15 Units at 02/03/24 0848   multivitamin (RENA-VIT) tablet 1 tablet, 1 tablet, Oral, QHS, Elgergawy, Dawood S, MD, 1 tablet at 02/03/24 0134   oxyCODONE  (Oxy IR/ROXICODONE ) immediate release tablet 5 mg, 5 mg, Oral, Q4H PRN, Pokhrel, Laxman, MD, 5 mg at 02/03/24 1300   pantoprazole  (PROTONIX ) EC tablet 40 mg, 40 mg, Oral, Daily, Elgergawy, Dawood S, MD, 40 mg at 02/03/24 0848   polyethylene glycol (MIRALAX  / GLYCOLAX ) packet 17 g, 17 g, Oral, Daily PRN, Elgergawy, Brayton RAMAN, MD   rosuvastatin  (CRESTOR ) tablet 10 mg, 10 mg, Oral, Daily, Elgergawy, Dawood S, MD, 10 mg at 02/03/24 0848   sevelamer  carbonate (RENVELA ) tablet 1,600 mg, 1,600 mg, Oral, TID WC, Elgergawy, Dawood S, MD, 1,600 mg at 02/03/24 1259 [2]  Social History Tobacco Use  Smoking Status Former   Current packs/day: 0.00   Average packs/day: 1 pack/day for 45.0 years (45.0 ttl pk-yrs)   Types: Cigarettes   Start date: 01/12/1970   Quit date: 01/13/2015   Years since quitting: 9.0  Smokeless Tobacco Never  [3]  Allergies Allergen Reactions   Codeine  Anaphylaxis, Hives, Swelling and Other (See Comments)    Swelling all over body and the throat   Clindamycin /Lincomycin Nausea And Vomiting   "

## 2024-02-03 NOTE — Progress Notes (Signed)
" °   02/03/24 0045  Vitals  Temp 98 F (36.7 C)  Temp Source Oral  BP (!) 110/54  MAP (mmHg) 72  BP Location Right Arm  BP Method Automatic  Patient Position (if appropriate) Lying  Pulse Rate 80  Pulse Rate Source Monitor  ECG Heart Rate 80  Resp (!) 22  Oxygen Therapy  SpO2 98 %  O2 Device Room Air  O2 Flow Rate (L/min) 2 L/min  During Treatment Monitoring  Blood Flow Rate (mL/min) 0 mL/min  Arterial Pressure (mmHg) -23.03 mmHg  Venous Pressure (mmHg) 51.31 mmHg  TMP (mmHg) 13.13 mmHg  Ultrafiltration Rate (mL/min) 722 mL/min  Dialysate Flow Rate (mL/min) 299 ml/min  Dialysate Potassium Concentration 2  Dialysate Calcium  Concentration 2.5  Duration of HD Treatment -hour(s) 3.5 hour(s)  Cumulative Fluid Removed (mL) per Treatment  1600.15  HD Safety Checks Performed Yes  Intra-Hemodialysis Comments Tx completed  Dialysis Fluid Bolus Normal Saline  Post Treatment  Dialyzer Clearance Lightly streaked  Liters Processed 73.5  Fluid Removed (mL) 1600 mL  Tolerated HD Treatment Yes  Post-Hemodialysis Comments pt stable  AVG/AVF Arterial Site Held (minutes) 10 minutes  AVG/AVF Venous Site Held (minutes) 10 minutes    "

## 2024-02-03 NOTE — Discharge Summary (Signed)
 "  Physician Discharge Summary  Edgar Brewer FMW:[id number] DOB: 1960-11-26 DOA: 02/01/2024  PCP: Katrinka Garnette KIDD, MD  Admit date: 02/01/2024 Discharge date: 02/03/2024  Admitted From: Home  Discharge disposition: Home with home health   Recommendations for Outpatient Follow-Up:   Follow up with your primary care provider in one week.  Check CBC, BMP, magnesium  in the next visit Follow-up with Dr. Tobie podiatry in 1 week for postoperative follow-up.  Weightbearing to the affected extremity on postop shoe   Discharge Diagnosis:   Principal Problem:   Acute osteomyelitis of right foot (HCC) Active Problems:   Obesity   Anemia of chronic kidney failure   PAD (peripheral artery disease)   Chronic diastolic CHF (congestive heart failure) (HCC)   Type 2 diabetes mellitus with diabetic neuropathy, unspecified (HCC)   History of stroke   GERD (gastroesophageal reflux disease)   Wound of left leg   Discharge Condition: Improved.  Diet recommendation: Low sodium, heart healthy.  Carbohydrate-modified.   Wound care: Keep the dressing intact  Code status: Full.   History of Present Illness:   Edgar Brewer  is a 64 y.o. male, PMH of ESRD on HD MWF, GERD, CAD, PAD, HTN, DM2, chronic HFpEF, obesity, anemia, history of recent hospitalization and September for foot infection and culture-negative endocarditis, and recent hospitalization for influenza A in December presented to hospital with some discharge from his left TMA wound.  He was seen at the podiatry clinic.  X-ray showed some abnormality and was admitted to the hospital for surgical intervention.   Hospital Course:   Following conditions were addressed during hospitalization as listed below,  Left foot acute osteomyelitis with wound dehiscence. History of TMA of the left foot, he was treated with IV vancomycin  and ceftazidime  in the past for culture-negative endocarditis, with end of therapy day 12/13/2023 .  Patient  presented to the podiatry office this time concerning for wound dehiscence and infection.  MRI of the foot was done, blood cultures were sent. Patient then underwent surgical intervention and revision transmetatarsal amputation of metatarsals 1 through 5 and debridement of the ulceration on 02/02/2024.    Currently on Augmentin  and doxycycline  after surgical intervention.  This will be continued for 7 more days on discharge.  Patient has been seen by podiatry today and will follow-up in podiatry clinic in 1 week.  Advised nonweightbearing to the affected extremity with postop shoe.  PT has seen the patient and recommended skilled nursing facility placement initially but has been changed to home health at this time.   ESRD Patient is on Monday Wednesday Friday hemodialysis.  Nephrology was consulted during hospitalization for hemodialysis needs.   Essential hypertension Not on medications.    Blood pressure seems to be stable at this time.   Type 2 diabetes mellitus. -On Lantus  30 units daily at bedtime.  Llatest hemoglobin A1c of 6.1.  Continue diabetic diet   History of CVA and PAD. Hyperlipidemia On Plavix  and aspirin  continue statins.   Anemia of chronic kidney disease. Latest hemoglobin of 10.4.  Nephrology will follow the patient as outpatient  Disposition.  At this time, patient is stable for disposition home with home health.  Patient will follow-up with PCP in podiatry as outpatient.  Medical Consultants:   Podiatry  Procedures:    Revision TMA of the left foot on 02/02/2024 by Dr. Tobie. Subjective:   Today, patient was seen and examined at bedside.  Complains of mild pain in the hip not controlled with Tylenol .  Wishes stronger medication.  Denies any shortness of breath nausea vomiting fever chills or rigor.  Has had bowel movements.   Discharge Exam:   Vitals:   02/03/24 0809 02/03/24 1404  BP: (!) 104/52 112/76  Pulse: 81 92  Resp: 16 17  Temp: 98.6 F (37 C) 98.3 F  (36.8 C)  SpO2: 96% 100%   Vitals:   02/03/24 0146 02/03/24 0341 02/03/24 0809 02/03/24 1404  BP: (!) 115/55 (!) 112/52 (!) 104/52 112/76  Pulse: 81 81 81 92  Resp: 18 14 16 17   Temp: 98.6 F (37 C) 97.6 F (36.4 C) 98.6 F (37 C) 98.3 F (36.8 C)  TempSrc: Oral Oral Oral Oral  SpO2: 94% 94% 96% 100%  Weight:      Height:       GENERAL: Patient is alert awake and oriented. Not in obvious distress. HENT: No scleral pallor or icterus. Pupils equally reactive to light. Oral mucosa is moist NECK: is supple, no gross swelling noted. CHEST: Clear to auscultation. No crackles or wheezes.   CVS: S1 and S2 heard, no murmur. Regular rate and rhythm.  ABDOMEN: Soft, non-tender, bowel sounds are present. EXTREMITIES: Left TMA with Ace bandage. CNS: Cranial nerves are intact. No focal motor deficits. SKIN: warm and dry, left foot with TMA with dressing.  The results of significant diagnostics from this hospitalization (including imaging, microbiology, ancillary and laboratory) are listed below for reference.     Diagnostic Studies:   DG Foot 2 Views Left Result Date: 02/02/2024 CLINICAL DATA:  Postop. EXAM: LEFT FOOT - 2 VIEW COMPARISON:  Radiograph yesterday FINDINGS: Interval resection of the metatarsals. There may be partial resection of the tarsal bones, not well assessed due to positioning. Expected postoperative changes in the overlying soft tissues with skin staples in place. IMPRESSION: Interval resection of the metatarsals. Electronically Signed   By: Andrea Gasman M.D.   On: 02/02/2024 16:03   MR FOOT LEFT WO CONTRAST Result Date: 02/02/2024 CLINICAL DATA:  Left foot wound status post transmetatarsal amputation in September 2025. Concern for osteomyelitis. EXAM: MRI OF THE LEFT FOOT WITHOUT CONTRAST TECHNIQUE: Multiplanar, multisequence MR imaging of the left foot was performed. No intravenous contrast was administered. COMPARISON:  Earlier same day radiographs of the left foot.  MRI left foot dated 10/21/2023. FINDINGS: Despite efforts by the technologist and patient, motion artifact is present on today's exam and could not be eliminated. This reduces exam sensitivity and specificity. Soft tissue/Bones/Joint/Cartilage Status post transmetatarsal amputation of the first through fifth rays to the level of the proximal metatarsal shafts. Suspected sinus tract with fluid signal at the dorsal lateral amputation stump at the level of the remnant proximal fifth metatarsal measuring up to 2 mm in thickness extends to the transsection margin of the remnant proximal fifth metatarsal with associated marrow edema, concerning for osteomyelitis. Marrow edema with indistinctness of the transsection margin and shaft of the proximal remnant first metatarsal is concerning for osteomyelitis. Less pronounced edema like signal of the transsection margins of the remnant proximal second, third, and fourth metatarsals without appreciable corresponding T1 hypointensity could reflect reactive marrow changes, however, osteomyelitis can not be excluded. No additional acute osseous abnormality identified. Mild-to-moderate degenerative changes of the second TMT joint and mild degenerative changes elsewhere throughout the midfoot. No significant joint effusion. Ligaments Lisfranc ligament appears intact. Muscles and Tendons Postoperative changes related to transmetatarsal amputation. No significant tenosynovial fluid collection. Soft tissue No drainable abscess. Cutaneous thickening along the dorsal and lateral amputation  stump. IMPRESSION: 1. Postoperative changes related to transmetatarsal amputation of the first through fifth rays to the level of the proximal metatarsal shafts. Suspected sinus tract with fluid signal measuring up to 2 mm in thickness and surrounding inflammatory change at the dorsal lateral amputation stump extends to the transsection margin of the remnant proximal fifth metatarsal with associated  marrow edema, concerning for osteomyelitis. Marrow edema with indistinctness of the transsection margin and shaft of the proximal remnant first metatarsal is concerning for osteomyelitis. 2. Less pronounced edema like signal of the transsection margins of the remnant proximal second, third, and fourth metatarsals without appreciable corresponding T1 hypointensity could reflect reactive marrow changes, however, osteomyelitis can not be excluded. 3. No drainable abscess. Electronically Signed   By: Harrietta Sherry M.D.   On: 02/02/2024 09:29   DG Chest Port 1 View Result Date: 02/01/2024 EXAM: 1 VIEW(S) XRAY OF THE CHEST 02/01/2024 05:38:00 PM COMPARISON: 01/16/2024 CLINICAL HISTORY: Cough FINDINGS: LUNGS AND PLEURA: Low lung volumes with bronchovascular crowding. Left basilar linear opacities consistent with atelectasis. No pleural effusion. No pneumothorax. HEART AND MEDIASTINUM: No acute abnormality of the cardiac and mediastinal silhouettes. BONES AND SOFT TISSUES: No acute osseous abnormality. IMPRESSION: 1. No acute findings. Electronically signed by: Greig Pique MD MD 02/01/2024 08:07 PM EST RP Workstation: [id number]   DG Foot Complete Left Result Date: 02/01/2024 Please see detailed radiograph report in office note.    Labs:   Basic Metabolic Panel: Recent Labs  Lab 02/01/24 2042 02/02/24 0631 02/03/24 0614  NA 136 136 135  K 4.5 5.0 4.6  CL 93* 94* 95*  CO2 31 29 28   GLUCOSE 159* 144* 139*  BUN 31* 36* 20  CREATININE 8.06* 8.79* 5.91*  CALCIUM  10.2 9.9 9.5  PHOS 5.0*  --   --    GFR Estimated Creatinine Clearance: 13.8 mL/min (A) (by C-G formula based on SCr of 5.91 mg/dL (H)). Liver Function Tests: Recent Labs  Lab 02/01/24 2042  ALBUMIN  3.7   No results for input(s): LIPASE, AMYLASE in the last 168 hours. No results for input(s): AMMONIA in the last 168 hours. Coagulation profile No results for input(s): INR, PROTIME in the last 168 hours.  CBC: Recent  Labs  Lab 02/01/24 2042 02/02/24 0631 02/03/24 0614  WBC 6.9 7.8 7.0  HGB 10.9* 10.4* 10.5*  HCT 32.9* 31.9* 32.4*  MCV 91.6 91.9 92.0  PLT 324 295 275   Cardiac Enzymes: No results for input(s): CKTOTAL, CKMB, CKMBINDEX, TROPONINI in the last 168 hours. BNP: Invalid input(s): POCBNP CBG: Recent Labs  Lab 02/02/24 0918 02/02/24 1110 02/02/24 1720 02/03/24 0622 02/03/24 1156  GLUCAP 127* 131* 135* 127* 214*   D-Dimer No results for input(s): DDIMER in the last 72 hours. Hgb A1c Recent Labs    02/01/24 2042  HGBA1C 6.1*   Lipid Profile No results for input(s): CHOL, HDL, LDLCALC, TRIG, CHOLHDL, LDLDIRECT in the last 72 hours. Thyroid  function studies No results for input(s): TSH, T4TOTAL, T3FREE, THYROIDAB in the last 72 hours.  Invalid input(s): FREET3 Anemia work up No results for input(s): VITAMINB12, FOLATE, FERRITIN, TIBC, IRON, RETICCTPCT in the last 72 hours. Microbiology Recent Results (from the past 240 hours)  Culture, blood (Routine X 2) w Reflex to ID Panel     Status: None (Preliminary result)   Collection Time: 02/01/24  8:41 PM   Specimen: BLOOD  Result Value Ref Range Status   Specimen Description BLOOD SITE NOT SPECIFIED  Final   Special Requests  Final    BOTTLES DRAWN AEROBIC AND ANAEROBIC Blood Culture adequate volume   Culture   Final    NO GROWTH 2 DAYS Performed at [facility], [address] [place], [place] [address]    Report Status PENDING  Incomplete  Culture, blood (Routine X 2) w Reflex to ID Panel     Status: None (Preliminary result)   Collection Time: 02/01/24  8:49 PM   Specimen: BLOOD  Result Value Ref Range Status   Specimen Description BLOOD SITE NOT SPECIFIED  Final   Special Requests   Final    BOTTLES DRAWN AEROBIC AND ANAEROBIC Blood Culture adequate volume   Culture   Final    NO GROWTH 2 DAYS Performed at [facility], [address] [place],  [place] [address]    Report Status PENDING  Incomplete  MRSA Next Gen by PCR, Nasal     Status: None   Collection Time: 02/02/24  6:25 AM   Specimen: Nasal Mucosa; Nasal Swab  Result Value Ref Range Status   MRSA by PCR Next Gen NOT DETECTED NOT DETECTED Final    Comment: (NOTE) The GeneXpert MRSA Assay (FDA approved for NASAL specimens only), is one component of a comprehensive MRSA colonization surveillance program. It is not intended to diagnose MRSA infection nor to guide or monitor treatment for MRSA infections. Test performance is not FDA approved in patients less than 38 years old. Performed at [facility], [address] [place], [place] [address]   Aerobic/Anaerobic Culture w Gram Stain (surgical/deep wound)     Status: None (Preliminary result)   Collection Time: 02/02/24  8:51 AM   Specimen: PATH Bone biopsy; Tissue  Result Value Ref Range Status   Specimen Description TISSUE  Final   Special Requests AMPUTATION FOOT LEFT TRANSMETATARSAL  Final   Gram Stain   Final    MODERATE WBC PRESENT, PREDOMINANTLY PMN NO ORGANISMS SEEN    Culture   Final    FEW STAPHYLOCOCCUS AUREUS SUSCEPTIBILITIES TO FOLLOW Performed at [facility], [address] [place], [place] [address]    Report Status PENDING  Incomplete     Discharge Instructions:   Discharge Instructions     Call MD for:  redness, tenderness, or signs of infection (pain, swelling, redness, odor or green/yellow discharge around incision site)   Complete by: As directed    Call MD for:  severe uncontrolled pain   Complete by: As directed    Call MD for:  temperature >100.4   Complete by: As directed    Diet renal/carb modified with fluid restriction   Complete by: As directed    Discharge instructions   Complete by: As directed    Follow-up with your primary care provider in 1 week.  Check blood work at that time.  Follow-up with podiatry Dr Tobie in the clinic in 1 week.  Continue antibiotics as  prescribed.   Non Weightbearing on the affected extremity with a postop shoe.  No dressing change until next follow-up. Seek medical attention for worsening symptoms.   Leave dressing on - Keep it clean, dry, and intact until clinic visit   Complete by: As directed    No wound care   Complete by: As directed       Allergies as of 02/03/2024       Reactions   Codeine Anaphylaxis, Hives, Swelling, Other (See Comments)   Swelling all over body and the throat  Clindamycin /lincomycin Nausea And Vomiting        Medication List     TAKE these medications    amoxicillin -clavulanate 500-125 MG tablet Commonly known as: AUGMENTIN  Take 1 tablet by mouth at bedtime for 7 days.   aspirin  EC 81 MG tablet Take 81 mg by mouth daily. Swallow whole.   b complex-vitamin c -folic acid  0.8 MG Tabs tablet Take 1 tablet by mouth in the morning.   cinacalcet  60 MG tablet Commonly known as: SENSIPAR  Take 60 mg by mouth See admin instructions. In the evening. Tuesday Thursday, Saturday and Sunday   clopidogrel 75 MG tablet Commonly known as: Plavix Take 1 tablet (75 mg total) by mouth daily.   diphenhydramine-acetaminophen 25-500 MG Tabs tablet Commonly known as: TYLENOL PM Take 1 tablet by mouth at bedtime as needed (Sleep/Pain).   doxycycline 100 MG tablet Commonly known as: VIBRA-TABS Take 1 tablet (100 mg total) by mouth every 12 (twelve) hours for 7 days.   gabapentin 100 MG capsule Commonly known as: NEURONTIN TAKE 1 CAPSULE BY MOUTH THREE TIMES DAILY   Lantus SoloStar 100 UNIT/ML Solostar Pen Generic drug: insulin glargine Inject 30 Units into the skin daily.   omeprazole 20 MG capsule Commonly known as: PRILOSEC Take 1 capsule by mouth once daily What changed: when to take this   oxyCODONE 5 MG immediate release tablet Commonly known as: Oxy IR/ROXICODONE Take 1 tablet (5 mg total) by mouth every 6 (six) hours as needed for moderate pain (pain score 4-6) or severe pain  (pain score 7-10).   rosuvastatin 10 MG tablet Commonly known as: CRESTOR Take 1 tablet by mouth once daily What changed: when to take this   sevelamer carbonate 800 MG tablet Commonly known as: RENVELA Take 2 tablets (1,600 mg total) by mouth 3 (three) times daily with meals. What changed:  how much to take when to take this additional instructions   STOOL SOFTENER PO Take 4 tablets by mouth at bedtime.               Discharge Care Instructions  (From admission, onward)           Start     Ordered   02/03/24 0000  Leave dressing on - Keep it clean, dry, and intact until clinic visit        01 /10/26 1634            Contact information for follow-up providers     Katrinka Garnette KIDD, MD Follow up in 1 week(s).   Specialty: Family Medicine Contact information: [address] [place] [address] [phone]         Tobie Franky SQUIBB, DPM Follow up in 1 week(s).   Specialty: Podiatry Why: post surgery followup Contact information: [address] [place] [address] [phone]              Contact information for after-discharge care     Home Medical Care     [facility]) .   Service: Home Health Services Contact information: [address] [place] [address] [place]  [address] [phone]                      Time coordinating discharge: 39 minutes  Signed:  Laquia Rosano  [facility] 02/03/2024, 4:38 PM          "

## 2024-02-03 NOTE — Plan of Care (Signed)
  Problem: Clinical Measurements: Goal: Ability to maintain clinical measurements within normal limits will improve Outcome: Progressing   Problem: Nutrition: Goal: Adequate nutrition will be maintained Outcome: Progressing   Problem: Pain Managment: Goal: General experience of comfort will improve and/or be controlled Outcome: Progressing   Problem: Safety: Goal: Ability to remain free from injury will improve Outcome: Progressing

## 2024-02-05 ENCOUNTER — Telehealth: Payer: Self-pay | Admitting: *Deleted

## 2024-02-05 ENCOUNTER — Encounter (HOSPITAL_COMMUNITY): Payer: Self-pay

## 2024-02-05 LAB — SURGICAL PATHOLOGY

## 2024-02-05 NOTE — Progress Notes (Signed)
 Late Note Entry- Jan. 12, 2026  Pt was d/c on Saturday. Contacted [facility] this morning to be advised of pt's d/c date and that pt should resume care today.   Randine Mungo Dialysis Navigator [phone]

## 2024-02-05 NOTE — Transitions of Care (Post Inpatient/ED Visit) (Signed)
 "  02/05/2024  Name: Edgar Brewer MRN: [id number] DOB: 1960/06/05  Today's TOC FU Call Status: Today's TOC FU Call Status:: Successful TOC FU Call Completed TOC FU Call Complete Date: 02/05/24  Patient's Name and Date of Birth confirmed. Name, DOB  Transition Care Management Follow-up Telephone Call Date of Discharge: 02/13/24 Discharge Facility: [facility]) Type of Discharge: Inpatient Admission Primary Inpatient Discharge Diagnosis:: Acute osteomyelitis of left foot// AMPUTATION, FOOT, TRANSMETATARSAL (Left: Toe) How have you been since you were released from the hospital?: Better Any questions or concerns?: Yes Patient Questions/Concerns:: patient mother is having problems scheduling f/u with Podiatrist Patient Questions/Concerns Addressed: Other: (RN will call podiatrist and assist setting up appointment)  Items Reviewed: Did you receive and understand the discharge instructions provided?: Yes Medications obtained,verified, and reconciled?: Yes (Medications Reviewed) Any new allergies since your discharge?: No Dietary orders reviewed?: No Do you have support at home?: Yes People in Home [RPT]: alone Name of Support/Comfort Primary Source: Mother Edgar Brewer  Medications Reviewed Today: Medications Reviewed Today     Reviewed by Kennieth Cathlean DEL, RN (Case Manager) on 02/05/24 at 1225  Med List Status: <None>   Medication Order Taking? Sig Documenting Provider Last Dose Status Informant  amoxicillin -clavulanate (AUGMENTIN ) 500-125 MG tablet [id number] Yes Take 1 tablet by mouth at bedtime for 7 days. Pokhrel, Laxman, MD  Active   aspirin  EC 81 MG tablet [id number] Yes Take 81 mg by mouth daily. Swallow whole. [provider]  Active Self, Pharmacy Records, Spouse/Significant Other  b complex-vitamin c -folic acid  (NEPHRO-VITE) 0.8 MG TABS tablet [id number] Yes Take 1 tablet by mouth in the morning. [provider]  Active Self, Pharmacy Records, Spouse/Significant  Other           Med Note (SATTERFIELD, TEENA BRAVO   Mon May 18, 2020  9:04 PM)    cinacalcet  (SENSIPAR ) 60 MG tablet [id number] Yes Take 60 mg by mouth See admin instructions. In the evening. Tuesday Thursday, Saturday and Sunday [provider]  Active Self, Pharmacy Records, Spouse/Significant Other  clopidogrel  (PLAVIX ) 75 MG tablet [id number] Yes Take 1 tablet (75 mg total) by mouth daily. Magda Debby SAILOR, MD  Active Spouse/Significant Other, Self, Pharmacy Records  diphenhydramine -acetaminophen  (TYLENOL  PM) 25-500 MG TABS tablet [id number] Yes Take 1 tablet by mouth at bedtime as needed (Sleep/Pain). [provider]  Active Self, Pharmacy Records, Spouse/Significant Other  Docusate Calcium  (STOOL SOFTENER PO) [id number] Yes Take 4 tablets by mouth at bedtime. [provider]  Active Self, Pharmacy Records, Spouse/Significant Other  doxycycline  (VIBRA -TABS) 100 MG tablet [id number] Yes Take 1 tablet (100 mg total) by mouth every 12 (twelve) hours for 7 days. Pokhrel, Laxman, MD  Active   gabapentin  (NEURONTIN ) 100 MG capsule [id number] Yes TAKE 1 CAPSULE BY MOUTH THREE TIMES DAILY Katrinka Garnette KIDD, MD  Active Self, Spouse/Significant Other, Pharmacy Records  LANTUS  SOLOSTAR 100 UNIT/ML Solostar Pen [id number] Yes Inject 30 Units into the skin daily. Tobie Yetta HERO, MD  Active Spouse/Significant Other, Self, Pharmacy Records  omeprazole  [facility]) 20 MG capsule [id number] Yes Take 1 capsule by mouth once daily Katrinka Garnette KIDD, MD  Active Self, Spouse/Significant Other, Pharmacy Records  oxyCODONE  (OXY IR/ROXICODONE ) 5 MG immediate release tablet [id number] Yes Take 1 tablet (5 mg total) by mouth every 6 (six) hours as needed for moderate pain (pain score 4-6) or severe pain (pain score 7-10). Pokhrel, Laxman, MD  Active   rosuvastatin  (CRESTOR ) 10 MG tablet [id number] Yes Take 1 tablet by mouth once daily  Patient taking differently: Take 10 mg by mouth every evening.   Katrinka Garnette KIDD, MD  Active Self, Spouse/Significant Other, Pharmacy Records  sevelamer  carbonate (RENVELA ) 800 MG tablet [id number] Yes Take 2 tablets (1,600 mg total) by mouth 3 (three) times daily with meals.  Patient taking differently: Take 2-3 tablets by mouth See admin instructions. Take 3 tablets by mouth 3 times daily with meals. Then take 2 tablets by mouth with snacks.   Cheryle Page, MD  Active Self, Pharmacy Records, Spouse/Significant Other  Med List Note Allegra Deatrice LILLETTE Bishop 05/04/14 1538): [facility].            Home Care and Equipment/Supplies: Were Home Health Services Ordered?: Yes Name of Home Health Agency:: [facility] Has Agency set up a time to come to your home?: Yes First Home Health Visit Date: 02/06/24 Any new equipment or medical supplies ordered?: NA  Functional Questionnaire: Do you need assistance with bathing/showering or dressing?: Yes Do you need assistance with meal preparation?: Yes Do you need assistance with eating?: No Do you have difficulty maintaining continence: No Do you need assistance with getting out of bed/getting out of a chair/moving?: Yes Do you have difficulty managing or taking your medications?: No  Follow up appointments reviewed: PCP Follow-up appointment confirmed?: Yes Date of PCP follow-up appointment?: 02/13/24 Follow-up Provider: Dr Garnette Katrinka Specialist [facility] Follow-up appointment confirmed?: No Reason Specialist Follow-Up Not Confirmed: [facility] Calling Clinician Notified Provider Practice of Needed Appointment (RN Awaiting Dr Tobie mock to call with Date and time) Do you need transportation to your follow-up appointment?: No Do you understand care options if your condition(s) worsen?: Yes-patient verbalized understanding  SDOH Interventions Today    Flowsheet Row Most Recent Value  SDOH Interventions   Food Insecurity Interventions Intervention Not Indicated   Housing Interventions Intervention Not Indicated  Transportation Interventions Intervention Not Indicated, Patient Resources (Friends/Family)  Utilities Interventions Intervention Not Indicated   Discussed and offered 30 day TOC program.  Patient declined .  The patient has been provided with contact information for the care management team and has been advised to call with any health -related questions or concerns.  The patient verbalized understanding with current plan of care.  The patient is directed to their insurance card regarding availability of benefits coverage   RN contacted Dr Tobie office to set up follow up appointment. RN awaiting call back.  RN discussed No dressing change until next follow-up. Leave dressing on - Keep it clean, dry, and intact until clinic visit  Cathlean Headland BSN RN [facility] Health Care Management Coordinator [email or web address] Direct Dial: [phone]  Fax: [phone] Website: [email or web address]  "

## 2024-02-06 ENCOUNTER — Encounter: Payer: Self-pay | Admitting: Podiatry

## 2024-02-06 ENCOUNTER — Ambulatory Visit: Payer: Medicare (Managed Care) | Admitting: Podiatry

## 2024-02-06 DIAGNOSIS — I739 Peripheral vascular disease, unspecified: Secondary | ICD-10-CM

## 2024-02-06 DIAGNOSIS — M86272 Subacute osteomyelitis, left ankle and foot: Secondary | ICD-10-CM

## 2024-02-06 DIAGNOSIS — Z9889 Other specified postprocedural states: Secondary | ICD-10-CM

## 2024-02-06 LAB — CULTURE, BLOOD (ROUTINE X 2)
Culture: NO GROWTH
Culture: NO GROWTH
Special Requests: ADEQUATE
Special Requests: ADEQUATE

## 2024-02-06 NOTE — Progress Notes (Signed)
 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 1 pain. A1C 6.1. Wearing surgical shoe.Taking Antibiotics.       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 male returns for post-op check.   Patient seen for first office follow-up after left foot revisional transmetatarsal amputation at Lisfranc joint.  He is taking 2 antibiotics.  He has been trying to stay off the left foot using wheelchair for mobility.  Has left the dressing clean dry and intact since leaving the hospital.  Denies pain.  Review of Systems: Negative except as noted in the HPI. Denies N/V/F/Ch.  Past Medical History:  Diagnosis Date   Acute on chronic combined systolic and diastolic CHF, NYHA class 1 (HCC) 01/13/2015   Allergy    Arthritis    Chronic kidney disease    [facility].   Diabetes mellitus type 2 with complications (HCC)    Previous insulin , non-insulin  requiring noted 09/2018.  Complications include retinopathy, peripheral neuropathy.   GERD (gastroesophageal reflux disease)    at times   Head injury with loss of consciousness (HCC)    History of kidney stones    Hypertension    Neuromuscular disorder (HCC)    Neuropathy    Pulmonary hypertension (HCC)    PA peak pressure 41 mmHg 03/04/16 echo   Stroke [facility])    no residual weakness   Varicose veins of lower extremities with complications 12/24/2014   Current Medications[1]  Tobacco Use History[2]  Allergies[3] Objective:   There were no vitals filed for this visit.  There is no height or weight on file to calculate BMI. Constitutional Well developed. Well nourished.  Vascular Foot warm and well perfused. Capillary refill normal to all digits.   Neurologic Normal speech. Oriented to person, place, and time. Epicritic sensation  to light touch grossly present bilaterally.  Dermatologic Left foot amputation site appears to be well coapted no evidence of dehiscence or drainage no evidence of maceration or significant erythema.  There is some edema and laterally there is a very small area of mild necrosis though not major or putting the amputation site at risk laterally at this time.   Orthopedic: Tenderness to palpation noted about the surgical site.   Radiographs: FINDINGS: Interval resection of the metatarsals. There may be partial resection of the tarsal bones, not well assessed due to positioning. Expected postoperative changes in the overlying soft tissues with skin staples in place.   IMPRESSION: Interval resection of the metatarsals. Assessment:   1. Subacute osteomyelitis of left foot (HCC)   2. PAD (peripheral artery disease)   3. Post-operative state    Plan:  Patient was evaluated and treated and all questions answered.  S/p  left foot patient TMA to Lisfranc amputation -Progressing as expected post-operatively.  Amputation site well coapted no evidence of drainage or necrosis or maceration or dehiscence at this time -XR: See above -WB Status: Nonweightbearing to the left lower extremity and postop shoe -Will place a new Betadine wet-to-dry dressing today.  Recommend every other day Betadine wet-to-dry dressings at home.  Monitor for dehiscence or drainage and if so call us  - Follow-up next week Thursday for recheck        [name], DPM [facility]  02/06/2024      [1]  Current Outpatient Medications:    amoxicillin -clavulanate (AUGMENTIN ) 500-125 MG tablet, Take 1 tablet by mouth at bedtime for 7 days., Disp: 7 tablet, Rfl: 0   aspirin  EC 81 MG tablet, Take 81 mg by mouth daily. Swallow whole., Disp: , Rfl:    b complex-vitamin c -folic acid  (NEPHRO-VITE) 0.8 MG TABS tablet, Take 1 tablet by mouth in the morning., Disp: , Rfl:    cinacalcet   (SENSIPAR ) 60 MG tablet, Take 60 mg by mouth See admin instructions. In the evening. Tuesday Thursday, Saturday and Sunday, Disp: , Rfl:    clopidogrel  (PLAVIX ) 75 MG tablet, Take 1 tablet (75 mg total) by mouth daily., Disp: 30 tablet, Rfl: 11   diphenhydramine -acetaminophen  (TYLENOL  PM) 25-500 MG TABS tablet, Take 1 tablet by mouth at bedtime as needed (Sleep/Pain)., Disp: , Rfl:    Docusate Calcium  (STOOL SOFTENER PO), Take 4 tablets by mouth at bedtime., Disp: , Rfl:    doxycycline  (VIBRA -TABS) 100 MG tablet, Take 1 tablet (100 mg total) by mouth every 12 (twelve) hours for 7 days., Disp: 14 tablet, Rfl: 0   gabapentin  (NEURONTIN ) 100 MG capsule, TAKE 1 CAPSULE BY MOUTH THREE TIMES DAILY, Disp: 270 capsule, Rfl: 3   LANTUS  SOLOSTAR 100 UNIT/ML Solostar Pen, Inject 30 Units into the skin daily., Disp: , Rfl:    omeprazole  (PRILOSEC) 20 MG capsule, Take 1 capsule by mouth once daily, Disp: 90 capsule, Rfl: 0   oxyCODONE  (OXY IR/ROXICODONE ) 5 MG immediate release tablet, Take 1 tablet (5 mg total) by mouth every 6 (six) hours as needed for moderate pain (pain score 4-6) or severe pain (pain score 7-10)., Disp: 30 tablet, Rfl: 0   rosuvastatin  (CRESTOR ) 10 MG tablet, Take 1 tablet by mouth once daily (Patient taking differently: Take 10 mg by mouth every evening.), Disp: 90 tablet, Rfl: 0   sevelamer  carbonate (RENVELA ) 800 MG tablet, Take 2 tablets (1,600 mg total) by mouth 3 (three) times daily with meals. (Patient taking differently: Take 2-3 tablets by mouth See admin instructions. Take 3 tablets by mouth 3 times daily with meals. Then take 2 tablets by mouth with snacks.), Disp: , Rfl:  [2]  Social History Tobacco Use  Smoking Status Former   Current packs/day: 0.00   Average packs/day: 1 pack/day for 45.0 years (45.0 ttl pk-yrs)   Types: Cigarettes   Start date: 01/12/1970   Quit date: 01/13/2015   Years since quitting: 9.0  Smokeless Tobacco Never  [3]  Allergies Allergen Reactions    Codeine Anaphylaxis, Hives, Swelling and Other (See Comments)    Swelling all over body and the throat   Clindamycin /Lincomycin Nausea And Vomiting   "

## 2024-02-07 LAB — AEROBIC/ANAEROBIC CULTURE W GRAM STAIN (SURGICAL/DEEP WOUND)

## 2024-02-13 ENCOUNTER — Ambulatory Visit: Payer: Medicare (Managed Care) | Admitting: Family Medicine

## 2024-02-13 ENCOUNTER — Encounter: Payer: Self-pay | Admitting: Family Medicine

## 2024-02-13 VITALS — BP 128/64 | HR 91 | Temp 98.2°F | Ht 68.0 in | Wt 193.2 lb

## 2024-02-13 DIAGNOSIS — Z8709 Personal history of other diseases of the respiratory system: Secondary | ICD-10-CM

## 2024-02-13 DIAGNOSIS — N186 End stage renal disease: Secondary | ICD-10-CM

## 2024-02-13 DIAGNOSIS — E118 Type 2 diabetes mellitus with unspecified complications: Secondary | ICD-10-CM

## 2024-02-13 DIAGNOSIS — I10 Essential (primary) hypertension: Secondary | ICD-10-CM

## 2024-02-13 DIAGNOSIS — M868X7 Other osteomyelitis, ankle and foot: Secondary | ICD-10-CM

## 2024-02-13 DIAGNOSIS — E1169 Type 2 diabetes mellitus with other specified complication: Secondary | ICD-10-CM

## 2024-02-13 DIAGNOSIS — K5903 Drug induced constipation: Secondary | ICD-10-CM

## 2024-02-13 NOTE — Progress Notes (Signed)
 " Phone [phone]   Subjective:  Edgar Brewer is a 64 y.o. year old very pleasant male patient who presents for transitional care management and hospital follow up for osteomyelitis. Patient was hospitalized from February 01, 2024 to February 03, 2024. A TCM phone call was completed on February 05, 2024. Medical complexity moderate   Patient had recently been hospitalized for influenza A in December but presented to the hospital with discharge from his left transmetatarsal amputation wound.  Concern was for infection based on x-ray findings and he was admitted for care  MRI was completed and blood cultures were sent.  Patient underwent surgical intervention and revision of transmetatarsal amputation of metatarsals 1 through 5 and debridement of the ulceration on February 02, 2024.  He was treated with doxycycline  and Augmentin  after surgical intervention was planned for 7 more days after discharge-he is subsequently finished this course. he was advised to be nonweightbearing to the affected extremity with postop shoe.  Nursing facility placement was recommended initially but later changed to home health.  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 -For pain control he was sent home on oxycodone  but too constipated so had to pull back -He had postop follow-up on February 06, 2024 with Dr. Malvin with podiatry and has another follow-up in 2 days -using wheelchair and avoiding placing weigh ton food -has noted mild blood on wound but no dischrage  -taking docusate and senna with bowel movement 2 days ago but straining  For his end-stage renal disease he was continued on Monday Wednesday Friday dialysis-nephrology followed him in the hospital and provided treatments  For his hypertension he was maintained without blood pressure medication  For his diabetes he was maintained on Lantus  30 units daily with  sliding scale insulin .  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    Priority: High   PAD (peripheral artery disease) 10/20/2023    Priority: High   History of stroke 02/25/2020    Priority: High   ESRD on dialysis (HCC) 10/03/2018    Priority: High   Diabetes mellitus type 2 with complications (HCC) 01/14/2015    Priority: High   Chronic diastolic CHF (congestive heart failure) (HCC) 01/13/2015    Priority: High   Fatty liver 02/10/2022    Priority: Medium    GERD (gastroesophageal reflux disease) 03/25/2020    Priority: Medium    Aortic stenosis 02/25/2020    Priority: Medium    Type 2 diabetes mellitus with diabetic neuropathy, unspecified (HCC) 02/25/2020    Priority: Medium    Gallbladder abscess 11/15/2018    Priority: Medium    Elevated LFTs 10/18/2018    Priority: Medium    Other disorders of phosphorus metabolism 01/19/2018    Priority: Medium    Secondary hyperparathyroidism of renal origin 09/27/2017    Priority: Medium    Essential hypertension 01/14/2015    Priority: Medium    Anemia of chronic kidney failure 01/14/2015    Priority: Medium  Varicose veins of lower extremities with complications 12/24/2014    Priority: Medium    Wound of left leg 05/08/2020    Priority: Low   Left leg cellulitis 05/08/2020    Priority: Low   CKD (chronic kidney disease), stage IV (HCC) 01/14/2015    Priority: Low   Obesity 01/14/2015    Priority: Low   Chronic venous insufficiency 10/22/2014    Priority: Low   Acute osteomyelitis of right foot (HCC)  02/01/2024   Abnormal echocardiogram 10/31/2023   Bradycardia 10/30/2023   Pyogenic inflammation of bone (HCC) 10/20/2023   Neuropathy    Sepsis (HCC) 10/19/2023   Hyperkalemia 09/20/2023   Hyponatremia 09/20/2023   Gangrene of left foot (HCC) 09/19/2023    Medications- reviewed and updated  A medical reconciliation was performed comparing current medicines to hospital discharge medications. Current Outpatient Medications  Medication Sig Dispense Refill   aspirin  EC 81 MG tablet Take 81 mg by mouth daily. Swallow whole.     b complex-vitamin c -folic acid  (NEPHRO-VITE) 0.8 MG TABS tablet Take 1 tablet by mouth in the morning.     cinacalcet  (SENSIPAR ) 60 MG tablet Take 60 mg by mouth See admin instructions. In the evening. Tuesday Thursday, Saturday and Sunday     clopidogrel  (PLAVIX ) 75 MG tablet Take 1 tablet (75 mg total) by mouth daily. 30 tablet 11   diphenhydramine -acetaminophen  (TYLENOL  PM) 25-500 MG TABS tablet Take 1 tablet by mouth at bedtime as needed (Sleep/Pain).     Docusate Calcium  (STOOL SOFTENER PO) Take 4 tablets by mouth at bedtime.     gabapentin  (NEURONTIN ) 100 MG capsule TAKE 1 CAPSULE BY MOUTH THREE TIMES DAILY 270 capsule 3   LANTUS  SOLOSTAR 100 UNIT/ML Solostar Pen Inject 30 Units into the skin daily.     omeprazole  (PRILOSEC) 20 MG capsule Take 1 capsule by mouth once daily 90 capsule 0   rosuvastatin  (CRESTOR ) 10 MG tablet Take 1 tablet by mouth once daily (Patient taking differently: Take 10 mg by mouth every evening.) 90 tablet 0   sevelamer  carbonate (RENVELA ) 800 MG tablet Take 2 tablets (1,600 mg total) by mouth 3 (three) times daily with meals. (Patient taking differently: Take 2-3 tablets by mouth See admin instructions. Take 3 tablets by mouth 3 times daily with meals. Then take 2 tablets by mouth with snacks.)     No current facility-administered medications for this visit.   Objective  Objective:  BP 128/64 (BP Location: Left Arm, Patient Position:  Sitting, Cuff Size: Normal)   Pulse 91   Temp 98.2 F (36.8 C) (Temporal)   Ht 5' 8 (1.727 m)   Wt 193 lb 3.2 oz (87.6 kg)   SpO2 97%   BMI 29.38 kg/m  Gen: NAD, resting comfortably CV: RRR no murmurs rubs or gallops Lungs: CTAB no crackles, wheeze, rhonchi Abdomen: soft/nontender/nondistended/normal bowel sounds. No rebound or guarding.  Ext: no edema noted today, post outpatient shoe on left foot Skin: warm, dry    Assessment and Plan:   #TCM/hospital follow up   Assessment & Plan Other osteomyelitis of left foot (HCC) Osteomyelitis of the foot has been appropriately treated with revise transmetatarsal amputation as well as antibiotics as she has recently completed course.  Unfortunately this visit was after influenza hospitalization in [facility] is try to regain his strength with physical therapy-Home health currently coming out and I will supply orders with this visit serving as a face-to-face encounter.  He remains nonweightbearing. ESRD on dialysis [facility]) Patient maintain Monday  Wednesday Friday dialysis.  We gave the option of updating labs but since these are checked regularly dialysis we opted out-labs largely stable for leaving hospital in light of his ESRD Essential hypertension Blood pressure well-controlled at medication.  CHF controlled by dialysis Diabetes mellitus type 2 with complications (HCC) Well-controlled Lantus  30 units daily.  No reported lows. Lab Results  Component Value Date   HGBA1C 6.1 (H) 02/01/2024   HGBA1C 6.3 (H) 10/19/2023   HGBA1C 7.0 (A) 06/15/2023    Hyperlipidemia associated with type 2 diabetes mellitus (HCC) In light of peripheral arterial disease and history of stroke patient has been maintained on rosuvastatin  10 mg daily.  Triglycerides high and can work on diet.  LDL has been very well-controlled Lab Results  Component Value Date   CHOL 117 09/21/2023   HDL 32 (L) 09/21/2023   LDLCALC 31 09/21/2023   LDLDIRECT 41.0 07/06/2021    TRIG 268 (H) 09/21/2023   CHOLHDL 3.7 09/21/2023   History of influenza Patient reports he has recovered from influenza-no lingering respiratory concerns though some fatigue and working with physical therapy History of respiratory failure Patient's oxygenation is excellent at 97% today-appears to have recovered from pneumonia-they are to check with their home health to see if oxygen can be removed-if not okay to give verbal order from my team.  His activity is currently limited by nonweightbearing status Drug-induced constipation He reports constipation issues since taking oxycodone  so he limited his dose overall.  Already on Colace and senna.  We discussed adding MiraLAX  as per after visit summary but also if he gets too loose of stools pulling back due to risk of electrolyte abnormalities on dialysis  Recommended follow up: Return in about 3 months (around 05/13/2024) for followup or sooner if needed.Schedule b4 you leave. Future Appointments  Date Time Provider Department Center  02/15/2024 11:00 AM Malvin Marsa [facility], [place] [facility]  04/16/2024  1:30 PM [facility]  04/16/2024  2:30 PM [facility]  04/16/2024  3:00 PM [facility]  05/14/2024  2:20 PM Katrinka Garnette KIDD, MD [facility]  01/15/2025  3:00 PM [facility] ANNUAL WELLNESS VISIT 1 [facility] [place]    Lab/Order associations:   ICD-10-CM   1. Other osteomyelitis of left foot (HCC)  M86.8X7     2. ESRD on dialysis (HCC)  N18.6    Z99.2     3. Essential hypertension  I10     4. Diabetes mellitus type 2 with complications (HCC)  E11.8     5. Hyperlipidemia associated with type 2 diabetes mellitus (HCC)  E11.69    E78.5     6. History of influenza  Z87.09     7. History of respiratory failure  Z87.09     8. Drug-induced constipation  K59.03      No orders of the defined types were placed in this encounter.  Return precautions advised.  Garnette Katrinka,  MD   "

## 2024-02-13 NOTE — Assessment & Plan Note (Signed)
 Well-controlled Lantus  30 units daily.  No reported lows. Lab Results  Component Value Date   HGBA1C 6.1 (H) 02/01/2024   HGBA1C 6.3 (H) 10/19/2023   HGBA1C 7.0 (A) 06/15/2023

## 2024-02-13 NOTE — Patient Instructions (Addendum)
"   Speak with your home health agency and tell them we discussed no longer needs oxygen- they may need a verbal or written order from me and they can request that after they speak to you. If not able to get this completed within 10-14 days reach back out with name of company and we will reach out ourselves  Trial miralax  or generic version 1 capful daily until soft stools. If loose stools stop immediately and inform dialysis as can affect electrolytes. If after a good bowel movement you still feel need assistance can go down to 1/2 capful daily to keep moving but have lighter amount  Recommended follow up: Return in about 3 months (around 05/13/2024) for followup or sooner if needed.Schedule b4 you leave. Please cancel the February 3rd visit on the way out today "

## 2024-02-13 NOTE — Assessment & Plan Note (Signed)
 Patient maintain Monday Wednesday Friday dialysis.  We gave the option of updating labs but since these are checked regularly dialysis we opted out-labs largely stable for leaving hospital in light of his ESRD

## 2024-02-13 NOTE — Assessment & Plan Note (Signed)
 Osteomyelitis of the foot has been appropriately treated with revise transmetatarsal amputation as well as antibiotics as she has recently completed course.  Unfortunately this visit was after influenza hospitalization in [facility] is try to regain his strength with physical therapy-Home health currently coming out and I will supply orders with this visit serving as a face-to-face encounter.  He remains nonweightbearing.

## 2024-02-13 NOTE — Assessment & Plan Note (Signed)
 Blood pressure well-controlled at medication.  CHF controlled by dialysis

## 2024-02-15 ENCOUNTER — Ambulatory Visit: Payer: Medicare (Managed Care) | Admitting: Podiatry

## 2024-02-15 ENCOUNTER — Encounter: Payer: Self-pay | Admitting: Podiatry

## 2024-02-15 DIAGNOSIS — I739 Peripheral vascular disease, unspecified: Secondary | ICD-10-CM

## 2024-02-15 DIAGNOSIS — Z9889 Other specified postprocedural states: Secondary | ICD-10-CM

## 2024-02-15 DIAGNOSIS — M86272 Subacute osteomyelitis, left ankle and foot: Secondary | ICD-10-CM

## 2024-02-15 NOTE — Progress Notes (Signed)
 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 male returns for post-op check.   Patient following up on left foot transmetatarsal amputation at Lisfranc joint.  He reports he is doing well he denies pain.  Has been doing Betadine dressings as previous instructed.  Walking in a postop shoe  Review of Systems: Negative except as noted in the HPI. Denies N/V/F/Ch.  Past Medical History:  Diagnosis Date   Acute on chronic combined systolic and diastolic CHF, NYHA class 1 (HCC) 01/13/2015   Allergy    Arthritis    Chronic kidney disease    [facility].   Diabetes mellitus type 2 with complications (HCC)    Previous insulin , non-insulin  requiring noted 09/2018.  Complications include retinopathy, peripheral neuropathy.   GERD (gastroesophageal reflux disease)    at times   Head injury with loss of consciousness (HCC)    History of kidney stones    Hypertension    Neuromuscular disorder (HCC)    Neuropathy    Pulmonary hypertension (HCC)    PA peak pressure 41 mmHg 03/04/16 echo   Stroke [facility])    no residual weakness   Varicose veins of lower extremities with complications 12/24/2014   Current Medications[1]  Tobacco Use History[2]  Allergies[3] Objective:   There were no vitals filed for this visit.  There is no height or weight on file to calculate BMI. Constitutional Well developed. Well nourished.  Vascular Foot warm and well perfused. Capillary refill normal to all digits.   Neurologic Normal speech. Oriented to person, place, and time. Epicritic sensation to light touch grossly present bilaterally.  Dermatologic Left foot amputation site improved from prior.  Decreased maceration laterally and increased eschar and  healing noted at that area.  Still well coapted no evidence of dehiscence or significant necrosis or maceration.        Orthopedic: Tenderness to palpation noted about the surgical site.   Radiographs: FINDINGS: Interval resection of the metatarsals. There may be partial resection of the tarsal bones, not well assessed due to positioning. Expected postoperative changes in the overlying soft tissues with skin staples in place.   IMPRESSION: Interval resection of the metatarsals. Assessment:   1. Subacute osteomyelitis of left foot (HCC)   2. PAD (peripheral artery disease)   3. Post-operative state     Plan:  Patient was evaluated and treated and all questions answered.  S/p  left foot patient TMA to Lisfranc amputation -Progressing as expected post-operatively.  Amputation site well coapted and improved today with increased healing especially laterally there is some dried eschar but no significant necrosis or dehiscence -XR: See above -WB Status: Nonweightbearing to the left lower extremity and postop shoe -Placed in a Xeroform 4 x 4 Kerlix Ace dressing.  Continue with the Betadine dressings as they have been doing recommend every other day Betadine wet-to-dry dressings at home.  Monitor for dehiscence or drainage and if so call us  - Follow-up in 2 weeks for wound check and staple removal        Edgar Brewer, DPM [facility] / [facility]                   02/15/2024      [  1]  Current Outpatient Medications:    aspirin  EC 81 MG tablet, Take 81 mg by mouth daily. Swallow whole., Disp: , Rfl:    b complex-vitamin c -folic acid  (NEPHRO-VITE) 0.8 MG TABS tablet, Take 1 tablet by mouth in the morning., Disp: , Rfl:    cinacalcet  (SENSIPAR ) 60 MG tablet, Take 60 mg by mouth See admin instructions. In the evening. Tuesday Thursday, Saturday and Sunday, Disp: , Rfl:    clopidogrel  (PLAVIX ) 75 MG tablet, Take 1 tablet (75 mg total) by mouth daily., Disp: 30 tablet, Rfl:  11   diphenhydramine -acetaminophen  (TYLENOL  PM) 25-500 MG TABS tablet, Take 1 tablet by mouth at bedtime as needed (Sleep/Pain)., Disp: , Rfl:    Docusate Calcium  (STOOL SOFTENER PO), Take 4 tablets by mouth at bedtime., Disp: , Rfl:    gabapentin  (NEURONTIN ) 100 MG capsule, TAKE 1 CAPSULE BY MOUTH THREE TIMES DAILY, Disp: 270 capsule, Rfl: 3   LANTUS  SOLOSTAR 100 UNIT/ML Solostar Pen, Inject 30 Units into the skin daily., Disp: , Rfl:    omeprazole  (PRILOSEC) 20 MG capsule, Take 1 capsule by mouth once daily, Disp: 90 capsule, Rfl: 0   rosuvastatin  (CRESTOR ) 10 MG tablet, Take 1 tablet by mouth once daily (Patient taking differently: Take 10 mg by mouth every evening.), Disp: 90 tablet, Rfl: 0   sevelamer  carbonate (RENVELA ) 800 MG tablet, Take 2 tablets (1,600 mg total) by mouth 3 (three) times daily with meals. (Patient taking differently: Take 2-3 tablets by mouth See admin instructions. Take 3 tablets by mouth 3 times daily with meals. Then take 2 tablets by mouth with snacks.), Disp: , Rfl:  [2]  Social History Tobacco Use  Smoking Status Former   Current packs/day: 0.00   Average packs/day: 1 pack/day for 45.0 years (45.0 ttl pk-yrs)   Types: Cigarettes   Start date: 01/12/1970   Quit date: 01/13/2015   Years since quitting: 9.0  Smokeless Tobacco Never  [3]  Allergies Allergen Reactions   Codeine Anaphylaxis, Hives, Swelling and Other (See Comments)    Swelling all over body and the throat   Clindamycin /Lincomycin Nausea And Vomiting   "

## 2024-02-20 ENCOUNTER — Telehealth: Payer: Self-pay | Admitting: Family Medicine

## 2024-02-20 NOTE — Telephone Encounter (Signed)
 I am unaware of paperwork to fax over for this.    Copied from CRM #[id number]. Topic: General - Other >> Feb 20, 2024  8:35 AM Deleta RAMAN wrote: Reason for CRM: patient mother is calling to notify the office that oxygen tanks needs to be picked up from her home. States pcp is aware and needs to fax over paperwork to [phone] for the tanks to be picked up.

## 2024-02-21 ENCOUNTER — Other Ambulatory Visit: Payer: Self-pay | Admitting: Family Medicine

## 2024-02-22 NOTE — Telephone Encounter (Signed)
 Attempted to call [employer] x3 times, unable to ge through to anyone. I stayed on hold for 15 minutes each time.   The fax number on their website does not work for me to fax orders to.   I will continue to try to reach them through out the day.

## 2024-02-22 NOTE — Telephone Encounter (Signed)
 From last visit AVS -  Speak with your home health agency and tell them we discussed no longer needs oxygen- they may need a verbal or written order from me and they can request that after they speak to you. If not able to get this completed within 10-14 days reach back out with name of company and we will reach out ourselves - can you reach out to him and get name of company so we can address- you can send in stamped or see if they will take verbal  Order should be: please remove all oxygen equipment- patient has recovered and no longer needs

## 2024-02-27 ENCOUNTER — Ambulatory Visit: Payer: PRIVATE HEALTH INSURANCE | Admitting: Family Medicine

## 2024-02-29 ENCOUNTER — Encounter: Payer: Medicare (Managed Care) | Admitting: Podiatry

## 2024-03-07 ENCOUNTER — Encounter: Payer: Medicare (Managed Care) | Admitting: Podiatry

## 2024-04-16 ENCOUNTER — Ambulatory Visit (HOSPITAL_COMMUNITY): Payer: Medicare (Managed Care)

## 2024-04-16 ENCOUNTER — Ambulatory Visit: Payer: Medicare (Managed Care)

## 2024-05-14 ENCOUNTER — Ambulatory Visit: Payer: Medicare (Managed Care) | Admitting: Family Medicine

## 2025-01-15 ENCOUNTER — Ambulatory Visit: Payer: Medicare (Managed Care)
